# Patient Record
Sex: Male | Born: 1950
Health system: Southern US, Community
[De-identification: ages and names within clinical notes are randomized; demographics above are authoritative.]

## PROBLEM LIST (undated history)

## (undated) DIAGNOSIS — F32A Depression, unspecified: Secondary | ICD-10-CM

## (undated) DIAGNOSIS — I2510 Atherosclerotic heart disease of native coronary artery without angina pectoris: Secondary | ICD-10-CM

## (undated) DIAGNOSIS — I723 Aneurysm of iliac artery: Secondary | ICD-10-CM

## (undated) DIAGNOSIS — Z87442 Personal history of urinary calculi: Secondary | ICD-10-CM

## (undated) DIAGNOSIS — M1990 Unspecified osteoarthritis, unspecified site: Secondary | ICD-10-CM

## (undated) DIAGNOSIS — I6529 Occlusion and stenosis of unspecified carotid artery: Secondary | ICD-10-CM

## (undated) DIAGNOSIS — E785 Hyperlipidemia, unspecified: Secondary | ICD-10-CM

## (undated) DIAGNOSIS — Z9289 Personal history of other medical treatment: Secondary | ICD-10-CM

## (undated) DIAGNOSIS — I209 Angina pectoris, unspecified: Secondary | ICD-10-CM

## (undated) DIAGNOSIS — I10 Essential (primary) hypertension: Secondary | ICD-10-CM

## (undated) DIAGNOSIS — I219 Acute myocardial infarction, unspecified: Secondary | ICD-10-CM

## (undated) DIAGNOSIS — F329 Major depressive disorder, single episode, unspecified: Secondary | ICD-10-CM

## (undated) DIAGNOSIS — I7779 Dissection of other artery: Secondary | ICD-10-CM

## (undated) DIAGNOSIS — I639 Cerebral infarction, unspecified: Secondary | ICD-10-CM

## (undated) HISTORY — DX: Hyperlipidemia, unspecified: E78.5

## (undated) HISTORY — DX: Depression, unspecified: F32.A

## (undated) HISTORY — DX: Occlusion and stenosis of unspecified carotid artery: I65.29

## (undated) HISTORY — DX: Dissection of other specified artery: I77.79

## (undated) HISTORY — DX: Personal history of other medical treatment: Z92.89

## (undated) HISTORY — DX: Major depressive disorder, single episode, unspecified: F32.9

## (undated) HISTORY — DX: Cerebral infarction, unspecified: I63.9

## (undated) HISTORY — PX: CORONARY ANGIOPLASTY: SHX604

---

## 2003-07-09 ENCOUNTER — Encounter: Payer: Self-pay | Admitting: Emergency Medicine

## 2003-07-09 ENCOUNTER — Emergency Department (HOSPITAL_COMMUNITY): Admission: EM | Admit: 2003-07-09 | Discharge: 2003-07-09 | Payer: Self-pay | Admitting: Emergency Medicine

## 2004-04-05 ENCOUNTER — Emergency Department (HOSPITAL_COMMUNITY): Admission: EM | Admit: 2004-04-05 | Discharge: 2004-04-05 | Payer: Self-pay | Admitting: Emergency Medicine

## 2006-03-10 ENCOUNTER — Emergency Department (HOSPITAL_COMMUNITY): Admission: EM | Admit: 2006-03-10 | Discharge: 2006-03-10 | Payer: Self-pay | Admitting: Emergency Medicine

## 2009-09-26 DIAGNOSIS — I639 Cerebral infarction, unspecified: Secondary | ICD-10-CM

## 2009-09-26 HISTORY — DX: Cerebral infarction, unspecified: I63.9

## 2009-09-30 ENCOUNTER — Emergency Department (HOSPITAL_COMMUNITY): Admission: EM | Admit: 2009-09-30 | Discharge: 2009-10-01 | Payer: Self-pay | Admitting: Emergency Medicine

## 2010-12-12 LAB — BASIC METABOLIC PANEL
BUN: 12 mg/dL (ref 6–23)
CO2: 27 mEq/L (ref 19–32)
Calcium: 9.3 mg/dL (ref 8.4–10.5)
Chloride: 102 mEq/L (ref 96–112)
Creatinine, Ser: 1.03 mg/dL (ref 0.4–1.5)
GFR calc Af Amer: >60 mL/min (ref >60)
GFR calc non Af Amer: >60 mL/min (ref >60)
Glucose, Bld: 176 mg/dL — ABNORMAL HIGH (ref 70–99)
Potassium: 3.6 mEq/L (ref 3.5–5.1)
Sodium: 137 mEq/L (ref 135–145)

## 2011-10-27 ENCOUNTER — Emergency Department (HOSPITAL_COMMUNITY): Payer: Self-pay

## 2011-10-27 ENCOUNTER — Encounter (HOSPITAL_COMMUNITY): Payer: Self-pay | Admitting: Emergency Medicine

## 2011-10-27 ENCOUNTER — Emergency Department (HOSPITAL_COMMUNITY)
Admission: EM | Admit: 2011-10-27 | Discharge: 2011-10-27 | Disposition: A | Discharge status: Home / Self Care | Payer: Self-pay | Attending: Emergency Medicine | Admitting: Emergency Medicine

## 2011-10-27 ENCOUNTER — Other Ambulatory Visit: Payer: Self-pay

## 2011-10-27 DIAGNOSIS — R51 Headache: Secondary | ICD-10-CM | POA: Insufficient documentation

## 2011-10-27 DIAGNOSIS — E119 Type 2 diabetes mellitus without complications: Secondary | ICD-10-CM | POA: Insufficient documentation

## 2011-10-27 DIAGNOSIS — R05 Cough: Secondary | ICD-10-CM | POA: Insufficient documentation

## 2011-10-27 DIAGNOSIS — I10 Essential (primary) hypertension: Secondary | ICD-10-CM | POA: Insufficient documentation

## 2011-10-27 DIAGNOSIS — R059 Cough, unspecified: Secondary | ICD-10-CM | POA: Insufficient documentation

## 2011-10-27 DIAGNOSIS — R0602 Shortness of breath: Secondary | ICD-10-CM | POA: Insufficient documentation

## 2011-10-27 DIAGNOSIS — F17200 Nicotine dependence, unspecified, uncomplicated: Secondary | ICD-10-CM | POA: Insufficient documentation

## 2011-10-27 HISTORY — DX: Essential (primary) hypertension: I10

## 2011-10-27 LAB — DIFFERENTIAL
Basophils Absolute: 0 10*3/uL (ref 0.0–0.1)
Basophils Relative: 0 % (ref 0–1)
Eosinophils Absolute: 0.2 10*3/uL (ref 0.0–0.7)
Eosinophils Relative: 2 % (ref 0–5)
Lymphocytes Relative: 25 % (ref 12–46)
Lymphs Abs: 2.1 10*3/uL (ref 0.7–4.0)
Monocytes Absolute: 0.5 10*3/uL (ref 0.1–1.0)
Monocytes Relative: 6 % (ref 3–12)
Neutro Abs: 5.7 10*3/uL (ref 1.7–7.7)
Neutrophils Relative %: 67 % (ref 43–77)

## 2011-10-27 LAB — CBC
HCT: 45.7 % (ref 39.0–52.0)
Hemoglobin: 15.9 g/dL (ref 13.0–17.0)
MCH: 29.7 pg (ref 26.0–34.0)
MCHC: 34.8 g/dL (ref 30.0–36.0)
MCV: 85.3 fL (ref 78.0–100.0)
Platelets: 177 10*3/uL (ref 150–400)
RBC: 5.36 MIL/uL (ref 4.22–5.81)
RDW: 12.6 % (ref 11.5–15.5)
WBC: 8.5 10*3/uL (ref 4.0–10.5)

## 2011-10-27 LAB — BASIC METABOLIC PANEL
BUN: 14 mg/dL (ref 6–23)
CO2: 26 mEq/L (ref 19–32)
Calcium: 9.4 mg/dL (ref 8.4–10.5)
Chloride: 98 mEq/L (ref 96–112)
Creatinine, Ser: 0.96 mg/dL (ref 0.50–1.35)
GFR calc Af Amer: >90 mL/min (ref >90)
GFR calc non Af Amer: 88 mL/min — ABNORMAL LOW (ref >90)
Glucose, Bld: 209 mg/dL — ABNORMAL HIGH (ref 70–99)
Potassium: 3.1 mEq/L — ABNORMAL LOW (ref 3.5–5.1)
Sodium: 135 mEq/L (ref 135–145)

## 2011-10-27 MED ORDER — SODIUM CHLORIDE 0.9 % IV SOLN
INTRAVENOUS | Status: DC
  Administered 2011-10-27: 17:00:00 via INTRAVENOUS

## 2011-10-27 MED ORDER — LABETALOL HCL 5 MG/ML IV SOLN
20.0000 mg | Freq: Once | INTRAVENOUS | Status: AC
  Administered 2011-10-27: 20 mg via INTRAVENOUS
  Filled 2011-10-27: qty 4

## 2011-10-27 MED ORDER — MORPHINE SULFATE 4 MG/ML IJ SOLN
4.0000 mg | Freq: Once | INTRAMUSCULAR | Status: AC
  Administered 2011-10-27: 4 mg via INTRAVENOUS
  Filled 2011-10-27: qty 1

## 2011-10-27 MED ORDER — LISINOPRIL 10 MG PO TABS
10.0000 mg | ORAL_TABLET | Freq: Once | ORAL | Status: AC
  Administered 2011-10-27: 10 mg via ORAL
  Filled 2011-10-27: qty 1

## 2011-10-27 MED ORDER — ONDANSETRON HCL 4 MG/2ML IJ SOLN
4.0000 mg | Freq: Once | INTRAMUSCULAR | Status: AC
  Administered 2011-10-27: 4 mg via INTRAVENOUS
  Filled 2011-10-27: qty 2

## 2011-10-27 MED ORDER — LISINOPRIL 20 MG PO TABS: 10.0000 mg | ORAL_TABLET | Freq: Two times a day (BID) | ORAL | Status: DC

## 2011-10-27 NOTE — ED Provider Notes (Signed)
History     CSN: [id number]  Arrival date & time 10/27/11  1556   First MD Initiated Contact with Patient 10/27/11 1631      No chief complaint on file.   (Consider location/radiation/quality/duration/timing/severity/associated sxs/prior treatment) The history is provided by the patient and a relative.   the patient is a 61 year old male, with a history of hypertension, and diabetes, who presents to emergency department complaining of mild headache, elevated blood pressure, and intermittent, nonproductive cough, and shortness of breath.  He denies nausea, vomiting, fevers, chills, and sweating.  He denies chest pain, or abdominal pain.  He states that he has not had his medications for his low pressure for 2 weeks.  He has been having the symptoms.  For 2 weeks.  He also states that his blood sugar has been elevated in the mornings.  Past Medical History  Diagnosis Date  . Hypertension   . Diabetes mellitus     No past surgical history on file.  No family history on file.  History  Substance Use Topics  . Smoking status: Current Everyday Smoker  . Smokeless tobacco: Not on file  . Alcohol Use: No      Review of Systems  Constitutional: Negative for fever, chills and diaphoresis.  Eyes: Negative for visual disturbance.  Respiratory: Positive for cough and shortness of breath.   Cardiovascular: Negative for chest pain and leg swelling.  Gastrointestinal: Negative for nausea, vomiting and abdominal pain.  Skin: Negative for rash.  Neurological: Positive for headaches.  Psychiatric/Behavioral: Negative for confusion.  All other systems reviewed and are negative.    Allergies  Review of patient's allergies indicates no known allergies.  Home Medications  No current outpatient prescriptions on file.  BP 225/127  Pulse 70  Temp(Src) 98.6 F (37 C) (Oral)  Resp 17  SpO2 100%  Physical Exam  Vitals reviewed. Constitutional: He is oriented to person, place, and  time. He appears well-developed and well-nourished. No distress.  HENT:  Head: Normocephalic and atraumatic.  Eyes: EOM are normal. Pupils are equal, round, and reactive to light.  Neck: Normal range of motion. Neck supple.  Cardiovascular: Normal rate, regular rhythm and normal heart sounds.   No murmur heard. Pulmonary/Chest: Effort normal and breath sounds normal. No respiratory distress. He has no wheezes. He has no rales.  Abdominal: Soft. Bowel sounds are normal. He exhibits no distension and no mass. There is no tenderness. There is no rebound and no guarding.  Musculoskeletal: Normal range of motion. He exhibits no edema and no tenderness.  Neurological: He is alert and oriented to person, place, and time. No cranial nerve deficit.  Skin: Skin is warm and dry. He is not diaphoretic.  Psychiatric: He has a normal mood and affect. His behavior is normal.    ED Course  Procedures (including critical care time) 61 year old male, with known hypertension, and diabetes.  Presents with mild headache and nonproductive cough, was severely elevated blood pressure.  He also reports elevated blood sugars for the past few days.  In the morning.  We will establish an IV and treat his hypertension, as well as performing laboratory testing, and a chest x-ray looking for endorgan damage, and the degree of hyperglycemia.  He has a normal mental status and neurological examination, and there is no indication that he has hepatic encephalopathy.  At this time.   Labs Reviewed  BASIC METABOLIC PANEL  CBC  DIFFERENTIAL   No results found.   No diagnosis found.  6:50 PM sbp 204. Asx.  Will give more labetalol  8:07 PM Asx. sbp 176.   MDM  htn No endorgan damage        Nicholes Stairs, MD 10/27/11 2008

## 2011-10-27 NOTE — ED Notes (Signed)
Pt bought in by [facility] for  High bp and  blood sugar out of meds  for 2 wks.denies   dizziness or pain  speaks no english( [place] )son is at bedside to interpet

## 2011-10-27 NOTE — ED Notes (Signed)
[id number]<AV> Expected date:10/27/11<BR> Expected time: 3:40 PM<BR> Means of arrival:Ambulance<BR> Comments:<BR> EMS 30 GC, 60 yom weakness w hypertension and hyperglycemia

## 2011-11-10 ENCOUNTER — Ambulatory Visit: Payer: Self-pay | Admitting: Family Medicine

## 2011-11-10 DIAGNOSIS — Z8673 Personal history of transient ischemic attack (TIA), and cerebral infarction without residual deficits: Secondary | ICD-10-CM | POA: Insufficient documentation

## 2011-11-10 DIAGNOSIS — I639 Cerebral infarction, unspecified: Secondary | ICD-10-CM

## 2011-11-10 DIAGNOSIS — I10 Essential (primary) hypertension: Secondary | ICD-10-CM | POA: Insufficient documentation

## 2011-11-10 DIAGNOSIS — E119 Type 2 diabetes mellitus without complications: Secondary | ICD-10-CM

## 2011-11-10 DIAGNOSIS — Z789 Other specified health status: Secondary | ICD-10-CM | POA: Insufficient documentation

## 2011-11-10 LAB — GLUCOSE, POCT (MANUAL RESULT ENTRY): POC Glucose: 127

## 2011-11-10 LAB — POCT GLYCOSYLATED HEMOGLOBIN (HGB A1C): Hemoglobin A1C: 8.7

## 2011-11-10 MED ORDER — AMLODIPINE BESYLATE 5 MG PO TABS: 5.0000 mg | ORAL_TABLET | Freq: Every day | ORAL | Status: DC

## 2011-11-10 MED ORDER — METFORMIN HCL 500 MG PO TABS: 500.0000 mg | ORAL_TABLET | Freq: Two times a day (BID) | ORAL | Status: DC

## 2011-11-10 MED ORDER — LISINOPRIL 20 MG PO TABS: 20.0000 mg | ORAL_TABLET | Freq: Two times a day (BID) | ORAL | Status: DC

## 2011-11-10 NOTE — Progress Notes (Signed)
Patient Name: Barry [name] Date of Birth: [birth year] Medical Record Number: [id number] Gender: male Date of Encounter: 11/10/2011  History of Present Illness:  Barry [name] is a 61 y.o. very pleasant male patient who presents with the following:  Was at [facility] ED or 10/27/11 with hypertension. Started on lisinopril at that time.  Barry [name] is [place]- here with his son today who interprets for him.  Barry [name] went to the ED due to feeling bad, flu- like symptoms.  He was feeling faint and having headaches.  Felt like his hands and legs were cramping.  His note also mentions diabetes- it does not seem that the patient or his son are aware of this diagnosis.   Now he feels much the same.  Still feels dizzy, still has headaches- not better.  Still has complaint of right sided tingling/ cramping and weakness/ tingling that seems to travel to different areas of his body. This has gone on for 2 years as well.    Fainted at work 3 or 4 years ago.  Was seen at a doctor somewhere and told he had HTN.  He started on medication but he did not continue to take it.    Was last at our office 12/2008 at which point he was not on anti-hypertensives.  In the course of the exam it also came out that Barry [name] had a stroke about 2 years ago with resultant left sided weakness- he states this is stable since his CVA.  He also apparently was aware that he has diabetes and does home glucose checks.    Reviewed labs from ED visit 2 weeks ago-    Chemistry      Component Value Date/Time   NA 135 10/27/2011 1715   K 3.1* 10/27/2011 1715   CL 98 10/27/2011 1715   CO2 26 10/27/2011 1715   BUN 14 10/27/2011 1715   CREATININE 0.96 10/27/2011 1715      Component Value Date/Time   CALCIUM 9.4 10/27/2011 1715     Lab Results  Component Value Date   WBC 8.5 10/27/2011   HGB 15.9 10/27/2011   HCT 45.7 10/27/2011   MCV 85.3 10/27/2011   PLT 177 10/27/2011   I have reviewed the patient's medical history in detail and updated the  computerized patient record Barry [name] this information should be available now.   There is no problem list on file for this patient.  Past Medical History  Diagnosis Date  . Hypertension   . Diabetes mellitus    No past surgical history on file. History  Substance Use Topics  . Smoking status: Former Smoker    Quit date: 06/09/2009  . Smokeless tobacco: Not on file  . Alcohol Use: No   No family history on file. No Known Allergies  Medication list has been reviewed and updated.  Review of Systems: As per HPI, otherwise negative.   Physical Examination: Filed Vitals:   11/10/11 1143  BP: 170/79  Pulse: 73  Temp: 99 F (37.2 C)  TempSrc: Oral  Resp: 16  Height: 5' 5.5" (1.664 m)  Weight: 176 lb 9.6 oz (80.105 kg)    Body mass index is 28.94 kg/(m^2).  GEN: WDWN, NAD, Non-toxic, A & O x 3.  Speaks little [place]- son interpreted.   HEENT: Atraumatic, Normocephalic. Neck supple. No masses, No LAD.  PEERLA, EOMI, fundoscopic wnl Ears and Nose: No external deformity.  TM wnl bilaterally, oropharynx wnl CV: RRR, No M/G/R. No JVD. No thrill. No extra heart sounds.  PULM: CTA B, no wheezes, crackles, rhonchi. No retractions. No resp. distress. No accessory muscle use. ABD: S, NT, ND, +BS. No rebound. No HSM. EXTR: No c/c/e.  symmetrical weakness left upper and lower extremity, also decreased sensation.  norml patellar and biceps tendon DTR bilaterally.   NEURO Normal gait.  PSYCH: Normally interactive. Conversant. Not depressed or anxious appearing.  Calm demeanor.  Skin:  No rashes, multiple tatoos  Results for orders placed in visit on 11/10/11  POCT GLYCOSYLATED HEMOGLOBIN (HGB A1C)      Component Value Range   Hemoglobin A1C 8.7    GLUCOSE, POCT (MANUAL RESULT ENTRY)      Component Value Range   POC Glucose 127      Assessment and Plan: 61 year old [place] male with history of CVA, also with uncontrolled HTN and Diabetes.  Will start on Metformin 500 BID. Apparently  he is currently taking lisinopril 20mg  3 tablets daily.  Will have him change to taking lisinopril 20 BID and add Norvasc 5mg  daily (avoid diuretics for now due to concern re: low potassium).  Plan recheck in one week.  Will look for potassium results- if still low will call in a potassium replacement.   [phone]  Son - [name]

## 2011-11-10 NOTE — Patient Instructions (Signed)
Take all medications as prescribed. Come in for a recheck in 1 week.

## 2011-11-11 LAB — COMPREHENSIVE METABOLIC PANEL
ALT: 32 U/L (ref 0–53)
AST: 25 U/L (ref 0–37)
Albumin: 4.3 g/dL (ref 3.5–5.2)
Alkaline Phosphatase: 90 U/L (ref 39–117)
BUN: 13 mg/dL (ref 6–23)
CO2: 28 mEq/L (ref 19–32)
Calcium: 9.6 mg/dL (ref 8.4–10.5)
Chloride: 102 mEq/L (ref 96–112)
Creat: 1.08 mg/dL (ref 0.50–1.35)
Glucose, Bld: 134 mg/dL — ABNORMAL HIGH (ref 70–99)
Potassium: 4.4 mEq/L (ref 3.5–5.3)
Sodium: 141 mEq/L (ref 135–145)
Total Bilirubin: 0.7 mg/dL (ref 0.3–1.2)
Total Protein: 7 g/dL (ref 6.0–8.3)

## 2011-11-12 ENCOUNTER — Encounter: Payer: Self-pay | Admitting: Family Medicine

## 2012-06-20 ENCOUNTER — Other Ambulatory Visit: Payer: Self-pay | Admitting: Family Medicine

## 2012-08-01 ENCOUNTER — Telehealth: Payer: Self-pay

## 2012-08-01 NOTE — Telephone Encounter (Signed)
Needs office visit. Can do one month but not three. Unable to leave message.

## 2012-08-01 NOTE — Telephone Encounter (Signed)
Pt son is calling regarding is leaving on the Nov. 15 th to go [place], pt would like to have all of his medications filled for 3 months. Best# [phone]

## 2012-08-02 ENCOUNTER — Ambulatory Visit (INDEPENDENT_AMBULATORY_CARE_PROVIDER_SITE_OTHER): Payer: Medicare Other | Admitting: Family Medicine

## 2012-08-02 ENCOUNTER — Other Ambulatory Visit: Payer: Self-pay | Admitting: Family Medicine

## 2012-08-02 VITALS — BP 135/82 | HR 66 | Temp 98.1°F | Resp 16 | Ht 65.0 in | Wt 167.0 lb

## 2012-08-02 DIAGNOSIS — E119 Type 2 diabetes mellitus without complications: Secondary | ICD-10-CM | POA: Diagnosis not present

## 2012-08-02 DIAGNOSIS — I6350 Cerebral infarction due to unspecified occlusion or stenosis of unspecified cerebral artery: Secondary | ICD-10-CM | POA: Diagnosis not present

## 2012-08-02 DIAGNOSIS — I10 Essential (primary) hypertension: Secondary | ICD-10-CM | POA: Diagnosis not present

## 2012-08-02 DIAGNOSIS — I639 Cerebral infarction, unspecified: Secondary | ICD-10-CM

## 2012-08-02 LAB — POCT CBC
Granulocyte percent: 59.3 %G (ref 37–80)
HCT, POC: 44.7 % (ref 43.5–53.7)
Hemoglobin: 13.8 g/dL — AB (ref 14.1–18.1)
Lymph, poc: 2.7 (ref 0.6–3.4)
MCH, POC: 28.6 pg (ref 27–31.2)
MCHC: 30.9 g/dL — AB (ref 31.8–35.4)
MCV: 92.5 fL (ref 80–97)
MID (cbc): 0.6 (ref 0–0.9)
MPV: 9.4 fL (ref 0–99.8)
POC Granulocyte: 4.7 (ref 2–6.9)
POC LYMPH PERCENT: 33.8 %L (ref 10–50)
POC MID %: 6.9 %M (ref 0–12)
Platelet Count, POC: 208 10*3/uL (ref 142–424)
RBC: 4.83 M/uL (ref 4.69–6.13)
RDW, POC: 13.7 %
WBC: 8 10*3/uL (ref 4.6–10.2)

## 2012-08-02 LAB — POCT URINALYSIS DIPSTICK
Bilirubin, UA: NEGATIVE
Glucose, UA: 100
Ketones, UA: NEGATIVE
Leukocytes, UA: NEGATIVE
Nitrite, UA: NEGATIVE
Protein, UA: NEGATIVE
Spec Grav, UA: 1.02
Urobilinogen, UA: 0.2
pH, UA: 7

## 2012-08-02 LAB — POCT GLYCOSYLATED HEMOGLOBIN (HGB A1C): Hemoglobin A1C: 6.7

## 2012-08-02 MED ORDER — METFORMIN HCL 500 MG PO TABS: 500.0000 mg | ORAL_TABLET | Freq: Two times a day (BID) | ORAL | Status: DC

## 2012-08-02 MED ORDER — LISINOPRIL 20 MG PO TABS: 20.0000 mg | ORAL_TABLET | Freq: Two times a day (BID) | ORAL | Status: DC

## 2012-08-02 NOTE — Patient Instructions (Addendum)
1. Type 2 diabetes mellitus  POCT CBC, POCT glycosylated hemoglobin (Hb A1C), POCT urinalysis dipstick, Microalbumin, urine, Comprehensive metabolic panel, metFORMIN (GLUCOPHAGE) 500 MG tablet  2. Essential hypertension, benign  POCT CBC, POCT glycosylated hemoglobin (Hb A1C), POCT urinalysis dipstick, Microalbumin, urine, Comprehensive metabolic panel  3. CVA (cerebral vascular accident)    4. Hypertension  lisinopril (PRINIVIL,ZESTRIL) 20 MG tablet      START ASPIRIN 325 MG ONE TABLET DAILY FOR STROKE AND HEART ATTACK PREVENTION

## 2012-08-02 NOTE — Progress Notes (Signed)
[address]   [place], [place]  [address]   [phone]  Subjective:    Patient ID: Barry [name], male    DOB: 24-Nov-1950, 61 y.o.   MRN: [id number]  HPIThis 61 y.o. male presents for nine month follow-up of the following:  1.  HTN:  Nine month follow-up.  [place]aving [place] for three [place]; going back to [place].  Taking Lisinopril 20mg  once daily.  Taking Amlodipine once daily.  +chest pain sometimes; usually occurs when hot.  +SOB at times.  No leg swelling.  +HA sometimes.  No dizziness.  2. DMII:  Nine month follow-up; checking blood sugars sporadically.  Friend has glucometer and checks sporadically.  Feels well.  3.  CVA: no daily ASA.  No HA, dizziness, focal weakness that is new.     Review of Systems  Constitutional: Negative for fever, chills, diaphoresis and fatigue.  Eyes: Negative for photophobia and visual disturbance.  Respiratory: Negative for shortness of breath, wheezing and stridor.   Cardiovascular: Positive for chest pain. Negative for palpitations and leg swelling.  Gastrointestinal: Negative for nausea, vomiting, abdominal pain, diarrhea and constipation.  Skin: Negative for color change, pallor, rash and wound.  Neurological: Negative for dizziness, tremors, seizures, syncope, facial asymmetry, speech difficulty, weakness, light-headedness, numbness and headaches.        Past Medical History  Diagnosis Date  . Hypertension   . Diabetes mellitus   . Stroke 09/26/2009    Syncope.  [facility]  admission.    Past Surgical History  Procedure Date  . Admission 09/26/2009    CVA.  [facility].    Prior to Admission medications   Medication Sig Start Date End Date Taking? Authorizing Provider  amLODipine (NORVASC) 5 MG tablet TAKE ONE TABLET BY MOUTH EVERY DAY 06/20/12  Yes Morrell Riddle, PA-C  lisinopril (PRINIVIL,ZESTRIL) 20 MG tablet Take 1 tablet (20 mg total) by mouth 2 (two) times daily. 08/02/12 08/02/13 Yes Ethelda Chick, MD  metFORMIN (GLUCOPHAGE) 500  MG tablet Take 1 tablet (500 mg total) by mouth 2 (two) times daily with a meal. 08/02/12 08/02/13 Yes Ethelda Chick, MD    No Known Allergies  History   Social History  . Marital Status: Single    Spouse Name: N/A    Number of Children: N/A  . Years of Education: N/A   Occupational History  . Not on file.   Social History Main Topics  . Smoking status: Former Smoker    Quit date: 06/09/2009  . Smokeless tobacco: Not on file  . Alcohol Use: No  . Drug Use: No  . Sexually Active: Not Currently   Other Topics Concern  . Not on file   Social History Narrative   Marital: married.   Lives: with son,wife.    Children: 3 children; 6 grandchildren   Employed: unemployed; disability unknown reason/CVA L sided weakness   Tobacco:  Quit 2012; smoked 40 years.    Alcohol: no drinking now; social in past.    Drugs: none    Exercise: sporadic.    ADLs:  No driving since CVA.    No family history on file.  Objective:a   Physical Exam  Nursing note and vitals reviewed. Constitutional: He is oriented to person, place, and time. He appears well-developed and well-nourished. No distress.  HENT:  Head: Normocephalic and atraumatic.  Right Ear: External ear normal.  Left Ear: External ear normal.  Nose: Nose normal.  Mouth/Throat: Oropharynx is clear and moist.  Eyes: Conjunctivae normal and EOM are normal. Pupils are equal, round, and reactive to light.  Neck: Normal range of motion. Neck supple. No JVD present. No thyromegaly present.  Cardiovascular: Normal rate, regular rhythm, normal heart sounds and intact distal pulses.  Exam reveals no gallop and no friction rub.   No murmur heard. Pulmonary/Chest: Effort normal and breath sounds normal. He has no wheezes. He has no rales.  Abdominal: Soft. Bowel sounds are normal. There is no tenderness. There is no rebound and no guarding.  Lymphadenopathy:    He has no cervical adenopathy.  Neurological: He is alert and oriented to person,  place, and time.  Skin: Skin is warm and dry. No rash noted. He is not diaphoretic. No erythema. No pallor.  Psychiatric: He has a normal mood and affect. His behavior is normal.       Results for orders placed in visit on 08/02/12  POCT CBC      Component Value Range   WBC 8.0  4.6 - 10.2 K/uL   Lymph, poc 2.7  0.6 - 3.4   POC LYMPH PERCENT 33.8  10 - 50 %L   MID (cbc) 0.6  0 - 0.9   POC MID % 6.9  0 - 12 %M   POC Granulocyte 4.7  2 - 6.9   Granulocyte percent 59.3  37 - 80 %G   RBC 4.83  4.69 - 6.13 M/uL   Hemoglobin 13.8 (*) 14.1 - 18.1 g/dL   HCT, POC 16.1  09.6 - 53.7 %   MCV 92.5  80 - 97 fL   MCH, POC 28.6  27 - 31.2 pg   MCHC 30.9 (*) 31.8 - 35.4 g/dL   RDW, POC 04.5     Platelet Count, POC 208  142 - 424 K/uL   MPV 9.4  0 - 99.8 fL  POCT GLYCOSYLATED HEMOGLOBIN (HGB A1C)      Component Value Range   Hemoglobin A1C 6.7    POCT URINALYSIS DIPSTICK      Component Value Range   Color, UA yellow     Clarity, UA clear     Glucose, UA 100     Bilirubin, UA neg     Ketones, UA neg     Spec Grav, UA 1.020     Blood, UA small     pH, UA 7.0     Protein, UA neg     Urobilinogen, UA 0.2     Nitrite, UA neg     Leukocytes, UA Negative      Assessment & Plan:   1. Type 2 diabetes mellitus  POCT CBC, POCT glycosylated hemoglobin (Hb A1C), POCT urinalysis dipstick, Microalbumin, urine, Comprehensive metabolic panel, metFORMIN (GLUCOPHAGE) 500 MG tablet  2. Essential hypertension, benign  POCT CBC, POCT glycosylated hemoglobin (Hb A1C), POCT urinalysis dipstick, Microalbumin, urine, Comprehensive metabolic panel  3. CVA (cerebral vascular accident)    4. Hypertension  lisinopril (PRINIVIL,ZESTRIL) 20 MG tablet     1. DMII: controlled; obtain labs.  Refills provided. 2.  HTN:  Controlled; obtain labs; refills provided. 3. CVA:  Stable; recommend starting ASA 325mg  daily.    Meds ordered this encounter  Medications  . metFORMIN (GLUCOPHAGE) 500 MG tablet    Sig: Take  1 tablet (500 mg total) by mouth 2 (two) times daily with a meal.    Dispense:  180 tablet    Refill:  1  . lisinopril (PRINIVIL,ZESTRIL) 20 MG tablet    Sig: Take  1 tablet (20 mg total) by mouth 2 (two) times daily.    Dispense:  180 tablet    Refill:  1

## 2012-08-03 LAB — COMPREHENSIVE METABOLIC PANEL
ALT: 35 U/L (ref 0–53)
AST: 33 U/L (ref 0–37)
Albumin: 4.2 g/dL (ref 3.5–5.2)
Alkaline Phosphatase: 81 U/L (ref 39–117)
BUN: 21 mg/dL (ref 6–23)
CO2: 31 mEq/L (ref 19–32)
Calcium: 9.7 mg/dL (ref 8.4–10.5)
Chloride: 106 mEq/L (ref 96–112)
Creat: 1.43 mg/dL — ABNORMAL HIGH (ref 0.50–1.35)
Glucose, Bld: 156 mg/dL — ABNORMAL HIGH (ref 70–99)
Potassium: 4.5 mEq/L (ref 3.5–5.3)
Sodium: 144 mEq/L (ref 135–145)
Total Bilirubin: 0.5 mg/dL (ref 0.3–1.2)
Total Protein: 6.8 g/dL (ref 6.0–8.3)

## 2012-08-04 ENCOUNTER — Other Ambulatory Visit: Payer: Self-pay | Admitting: Physician Assistant

## 2012-08-04 LAB — MICROALBUMIN, URINE: Microalb, Ur: 4.55 mg/dL — ABNORMAL HIGH (ref 0.00–1.89)

## 2012-08-05 NOTE — Telephone Encounter (Signed)
Pt seen in office 11/7

## 2012-11-05 NOTE — Progress Notes (Signed)
Reviewed and agree.

## 2012-11-22 ENCOUNTER — Emergency Department (HOSPITAL_COMMUNITY)
Admission: EM | Admit: 2012-11-22 | Discharge: 2012-11-22 | Disposition: A | Discharge status: Home / Self Care | Payer: Medicare Other | Attending: Emergency Medicine | Admitting: Emergency Medicine

## 2012-11-22 ENCOUNTER — Encounter (HOSPITAL_COMMUNITY): Payer: Self-pay | Admitting: Emergency Medicine

## 2012-11-22 ENCOUNTER — Emergency Department (HOSPITAL_COMMUNITY): Payer: Medicare Other

## 2012-11-22 DIAGNOSIS — K6389 Other specified diseases of intestine: Secondary | ICD-10-CM

## 2012-11-22 DIAGNOSIS — I10 Essential (primary) hypertension: Secondary | ICD-10-CM | POA: Diagnosis not present

## 2012-11-22 DIAGNOSIS — Z8673 Personal history of transient ischemic attack (TIA), and cerebral infarction without residual deficits: Secondary | ICD-10-CM | POA: Insufficient documentation

## 2012-11-22 DIAGNOSIS — E119 Type 2 diabetes mellitus without complications: Secondary | ICD-10-CM | POA: Diagnosis not present

## 2012-11-22 DIAGNOSIS — Z79899 Other long term (current) drug therapy: Secondary | ICD-10-CM | POA: Diagnosis not present

## 2012-11-22 DIAGNOSIS — R1084 Generalized abdominal pain: Secondary | ICD-10-CM | POA: Diagnosis not present

## 2012-11-22 DIAGNOSIS — Z87891 Personal history of nicotine dependence: Secondary | ICD-10-CM | POA: Insufficient documentation

## 2012-11-22 LAB — URINALYSIS, MICROSCOPIC ONLY
Bilirubin Urine: NEGATIVE
Glucose, UA: NEGATIVE mg/dL
Ketones, ur: NEGATIVE mg/dL
Leukocytes, UA: NEGATIVE
Nitrite: NEGATIVE
Protein, ur: NEGATIVE mg/dL
Specific Gravity, Urine: 1.016 (ref 1.005–1.030)
Urobilinogen, UA: 0.2 mg/dL (ref 0.0–1.0)
pH: 6 (ref 5.0–8.0)

## 2012-11-22 LAB — COMPREHENSIVE METABOLIC PANEL
ALT: 27 U/L (ref 0–53)
AST: 23 U/L (ref 0–37)
Albumin: 3.6 g/dL (ref 3.5–5.2)
Alkaline Phosphatase: 101 U/L (ref 39–117)
BUN: 13 mg/dL (ref 6–23)
CO2: 24 mEq/L (ref 19–32)
Calcium: 9.7 mg/dL (ref 8.4–10.5)
Chloride: 100 mEq/L (ref 96–112)
Creatinine, Ser: 1.03 mg/dL (ref 0.50–1.35)
GFR calc Af Amer: 88 mL/min — ABNORMAL LOW (ref >90)
GFR calc non Af Amer: 76 mL/min — ABNORMAL LOW (ref >90)
Glucose, Bld: 127 mg/dL — ABNORMAL HIGH (ref 70–99)
Potassium: 3.7 mEq/L (ref 3.5–5.1)
Sodium: 137 mEq/L (ref 135–145)
Total Bilirubin: 0.4 mg/dL (ref 0.3–1.2)
Total Protein: 7.9 g/dL (ref 6.0–8.3)

## 2012-11-22 LAB — CBC WITH DIFFERENTIAL/PLATELET
Basophils Absolute: 0 10*3/uL (ref 0.0–0.1)
Basophils Relative: 0 % (ref 0–1)
Eosinophils Absolute: 0.3 10*3/uL (ref 0.0–0.7)
Eosinophils Relative: 4 % (ref 0–5)
HCT: 47.4 % (ref 39.0–52.0)
Hemoglobin: 15.6 g/dL (ref 13.0–17.0)
Lymphocytes Relative: 31 % (ref 12–46)
Lymphs Abs: 2.5 10*3/uL (ref 0.7–4.0)
MCH: 28.9 pg (ref 26.0–34.0)
MCHC: 32.9 g/dL (ref 30.0–36.0)
MCV: 87.9 fL (ref 78.0–100.0)
Monocytes Absolute: 0.6 10*3/uL (ref 0.1–1.0)
Monocytes Relative: 7 % (ref 3–12)
Neutro Abs: 4.8 10*3/uL (ref 1.7–7.7)
Neutrophils Relative %: 58 % (ref 43–77)
Platelets: 195 10*3/uL (ref 150–400)
RBC: 5.39 MIL/uL (ref 4.22–5.81)
RDW: 12.7 % (ref 11.5–15.5)
WBC: 8.2 10*3/uL (ref 4.0–10.5)

## 2012-11-22 LAB — LIPASE, BLOOD: Lipase: 40 U/L (ref 11–59)

## 2012-11-22 LAB — GLUCOSE, CAPILLARY: Glucose-Capillary: 129 mg/dL — ABNORMAL HIGH (ref 70–99)

## 2012-11-22 LAB — POCT I-STAT TROPONIN I: Troponin i, poc: 0.01 ng/mL (ref 0.00–0.08)

## 2012-11-22 MED ORDER — OXYCODONE-ACETAMINOPHEN 5-325 MG PO TABS: 1.0000 | ORAL_TABLET | ORAL | Status: DC | PRN

## 2012-11-22 MED ORDER — ONDANSETRON HCL 4 MG/2ML IJ SOLN
4.0000 mg | Freq: Once | INTRAMUSCULAR | Status: AC
  Administered 2012-11-22: 4 mg via INTRAVENOUS
  Filled 2012-11-22: qty 2

## 2012-11-22 MED ORDER — SODIUM CHLORIDE 0.9 % IV BOLUS (SEPSIS)
1000.0000 mL | Freq: Once | INTRAVENOUS | Status: AC
  Administered 2012-11-22: 1000 mL via INTRAVENOUS

## 2012-11-22 MED ORDER — MORPHINE SULFATE 4 MG/ML IJ SOLN
6.0000 mg | Freq: Once | INTRAMUSCULAR | Status: AC
  Administered 2012-11-22: 6 mg via INTRAVENOUS
  Filled 2012-11-22: qty 2

## 2012-11-22 MED ORDER — IOHEXOL 300 MG/ML  SOLN
80.0000 mL | Freq: Once | INTRAMUSCULAR | Status: AC | PRN
  Administered 2012-11-22: 80 mL via INTRAVENOUS

## 2012-11-22 MED ORDER — IOHEXOL 300 MG/ML  SOLN
50.0000 mL | Freq: Once | INTRAMUSCULAR | Status: AC | PRN
  Administered 2012-11-22: 50 mL via ORAL

## 2012-11-22 NOTE — Progress Notes (Signed)
[facility] ED CM noted pt without pcp listed Spoke with pt, daughter and male family member who confirmed no pcp CM provided a list of medicare accepting pc[place] [phone] and discussed if medicaid referred to DSS case worker for assistance in finding accepting medicaid provider

## 2012-11-22 NOTE — ED Notes (Signed)
Pt presenting to ed with c/o [place] speaking male presenting to ed from front entrance by hospital staff with c/o dizziness, generalized weakness, frequent urination, and abdominal pain x couple of weeks. Pt denies chest pain, nausea, vomiting and diarrhea per family interpreting at bedside.

## 2012-11-23 ENCOUNTER — Telehealth: Payer: Self-pay | Admitting: Surgery

## 2012-11-23 ENCOUNTER — Other Ambulatory Visit: Payer: Self-pay | Admitting: *Deleted

## 2012-11-23 DIAGNOSIS — I7779 Dissection of other artery: Secondary | ICD-10-CM

## 2012-11-23 NOTE — Telephone Encounter (Addendum)
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 We'll need to contact him to schedule the appointment.  pt. needs interpreter I tried to inform patient of appt. for a cta at [facility] on 12-03-12 10:45 and then fu with dr. Myra Gianotti at 1:45 mailed appt. letter  ------

## 2012-11-25 NOTE — ED Provider Notes (Signed)
History    62 year old male with abdominal pain. Gradual onset approximately 2-1/2 weeks ago. Pain is diffuse and crampy. Waxes and wanes, but does not completely go away. No appreciable exacerbating/ relieving factors. Also complaining of generalized fatigue increased urinary frequency. No dysuria. No hematuria. No discharge. No nausea or vomiting. No history of abdominal surgery. History obtained via family members at bedside translating.  CSN: [id number]  Arrival date & time 11/22/12  1416   First MD Initiated Contact with Patient 11/22/12 1506      Chief Complaint  Patient presents with  . Dizziness  . generalized weakness   . Abdominal Pain    (Consider location/radiation/quality/duration/timing/severity/associated sxs/prior treatment) HPI  Past Medical History  Diagnosis Date  . Hypertension   . Diabetes mellitus   . Stroke 09/26/2009    Syncope.  [facility]  admission.    Past Surgical History  Procedure Laterality Date  . Admission  09/26/2009    CVA.  [facility].    No family history on file.  History  Substance Use Topics  . Smoking status: Former Smoker    Quit date: 06/09/2009  . Smokeless tobacco: Not on file  . Alcohol Use: No      Review of Systems  All systems reviewed and negative, other than as noted in HPI.  Allergies  Review of patient's allergies indicates no known allergies.  Home Medications   Current Outpatient Rx  Name  Route  Sig  Dispense  Refill  . amLODipine (NORVASC) 5 MG tablet   Oral   Take 5 mg by mouth daily.         . metFORMIN (GLUCOPHAGE) 500 MG tablet   Oral   Take 1 tablet (500 mg total) by mouth 2 (two) times daily with a meal.   180 tablet   1   . oxyCODONE-acetaminophen (PERCOCET/ROXICET) 5-325 MG per tablet   Oral   Take 1-2 tablets by mouth every 4 (four) hours as needed for pain.   20 tablet   0     BP 148/86  Pulse 56  Temp(Src) 98.2 F (36.8 C) (Oral)  Resp 11  SpO2 96%  Physical Exam   Nursing note and vitals reviewed. Constitutional: He appears well-developed and well-nourished. No distress.  Laying in bed. NAD.  HENT:  Head: Normocephalic and atraumatic.  Eyes: Conjunctivae are normal. Right eye exhibits no discharge. Left eye exhibits no discharge.  Neck: Neck supple.  Cardiovascular: Normal rate, regular rhythm and normal heart sounds.  Exam reveals no gallop and no friction rub.   No murmur heard. Pulmonary/Chest: Effort normal and breath sounds normal. No respiratory distress.  Abdominal: Soft. He exhibits no distension. There is tenderness. There is no rebound.  Mild tenderness periumbilically in the right lower quadrant. No rebound tenderness. No guarding  Musculoskeletal: He exhibits no edema and no tenderness.  Neurological: He is alert.  Skin: Skin is warm and dry. He is not diaphoretic.  Psychiatric: He has a normal mood and affect. His behavior is normal. Thought content normal.    ED Course  Procedures (including critical care time)  Labs Reviewed  GLUCOSE, CAPILLARY - Abnormal; Notable for the following:    Glucose-Capillary 129 (*)    All other components within normal limits  COMPREHENSIVE METABOLIC PANEL - Abnormal; Notable for the following:    Glucose, Bld 127 (*)    GFR calc non Af Amer 76 (*)    GFR calc Af Amer 88 (*)    All  other components within normal limits  URINALYSIS, MICROSCOPIC ONLY - Abnormal; Notable for the following:    Hgb urine dipstick SMALL (*)    All other components within normal limits  CBC WITH DIFFERENTIAL  LIPASE, BLOOD  POCT I-STAT TROPONIN I   No results found.  Ct Abdomen Pelvis W Contrast  11/22/2012  *RADIOLOGY REPORT*  Clinical Data: Right lower quadrant pain  CT ABDOMEN AND PELVIS WITH CONTRAST  Technique:  Multidetector CT imaging of the abdomen and pelvis was performed following the standard protocol during bolus administration of intravenous contrast.  Contrast: 80mL OMNIPAQUE IOHEXOL 300 MG/ML  SOLN,  50mL OMNIPAQUE IOHEXOL 300 MG/ML  SOLN  Comparison: None.  Findings: The lung bases appear clear.  No pericardial or pleural effusion identified.  Mild diffuse low attenuation throughout the liver parenchyma is identified compatible with hepatic steatosis.  No suspicious liver lesion noted.  Gallbladder is normal.  No biliary dilatation.  The pancreas is unremarkable.  Normal appearance of the spleen.  The adrenal glands are normal.  Within the inferior pole of the right kidney there is a stone measuring 5 mm, image 33/series 2. No left renal stones identified.  No hydronephrosis or hydroureter. The urinary bladder appears normal.  No bladder calculi identified. Prostate gland and the seminal vesicles are negative.  Calcified atherosclerotic disease affects the abdominal aorta.  Non- occlusive dissection is identified within the proximal superior mesenteric artery, image 28/series 2. Plaque ulceration is identified within the distal abdominal aorta just proximal to the bifurcation, image number 40/series 2.  There are no enlarged upper abdominal lymph nodes.  There is no pelvic or inguinal adenopathy.  The stomach appears within normal limits.  The small bowel loops are unremarkable.  No evidence for bowel obstruction.  There is mild edema involving the distal half of the appendix.  Appendiceal diameter measures up to 10 mm in thickness, image 48/series 8. There is abnormal inflammatory change involving the sigmoid colon. There is a fat attenuating structure within the sigmoid mesentery with peripheral fat stranding.  The appearance favors epiploic appendagitis, image 46/series 2. No evidence for free air.  No bowel perforation identified.  There is no significant free fluid or fluid collections identified.  Review of the visualized osseous structures is significant for mild lumbar degenerative disc disease.  IMPRESSION:  1.  Findings consistent with epiploic appendagitis involving the sigmoid colon.  2.  The distal  appendix is increased in diameter measuring up to 10 mm.  No additional secondary signs of acute appendicitis.  Careful clinical correlation for any clinical signs or symptoms of appendicitis is advised. 3.  Hepatic steatosis. 4.  Nonobstructing right renal calculus. 5.  Incidental note is made of focal dissection involving the proximal superior mesenteric artery.  Additionally, there is a focal area of plaque ulceration involving the distal abdominal aorta.  Both of these abnormalities appear nonocclusive and there is distal contrast opacification of the affected vessels.   Original Report Authenticated By: Signa Kell, M.D.    EKG:  Rhythm: Normal Vent. rate 70 BPM PR interval 176 ms QRS duration 108 ms QT/QTc 476/514 ms Nonspecific intraventricular delay. ST segments: Nonspecific ST changes inferiorly    1. Epiploic appendagitis   2. Proximal superior mesenteric artery dissection     MDM  62 year old male with abdominal pain. CT significant for an epiploic appendigitis and this is most likely etiology of his symptoms. Appendix is also dilated, but without any surrounding inflammatory changes. Patient does have mild tenderness on  exam, but no guarding/rebound. The timing of pt's symptoms starting several weeks ago is no consistent with acute appendicitis. No leukocytosis. CT also makes mention of a focal dissection of his proximal superior mesenteric artery. Case was discussed with vascular surgery who reviewed the CT scan with radiology. From a vascular standpoint, he feels that patient is fine for discharge at this time. Would like to see patient in followup in 2 weeks with a repeat CT prior to this. This was discussed with patient and his family. Understands the plan. Questions answered to their satisfaction. Emergent return precautions were discussed. Plan symptomatic treatment at this time.        Raeford Razor, MD 11/25/12 1540

## 2012-12-03 ENCOUNTER — Other Ambulatory Visit (INDEPENDENT_AMBULATORY_CARE_PROVIDER_SITE_OTHER): Payer: Medicare Other | Admitting: *Deleted

## 2012-12-03 ENCOUNTER — Ambulatory Visit (INDEPENDENT_AMBULATORY_CARE_PROVIDER_SITE_OTHER): Payer: Medicare Other | Admitting: Surgery

## 2012-12-03 ENCOUNTER — Encounter: Payer: Self-pay | Admitting: Surgery

## 2012-12-03 ENCOUNTER — Ambulatory Visit
Admission: RE | Admit: 2012-12-03 | Discharge: 2012-12-03 | Disposition: A | Discharge status: Home / Self Care | Payer: Medicare Other | Source: Ambulatory Visit | Attending: Surgery | Admitting: Surgery

## 2012-12-03 VITALS — BP 153/82 | HR 69 | Ht 65.0 in | Wt 171.9 lb

## 2012-12-03 DIAGNOSIS — I7779 Dissection of other artery: Secondary | ICD-10-CM

## 2012-12-03 DIAGNOSIS — G459 Transient cerebral ischemic attack, unspecified: Secondary | ICD-10-CM | POA: Diagnosis not present

## 2012-12-03 DIAGNOSIS — R209 Unspecified disturbances of skin sensation: Secondary | ICD-10-CM

## 2012-12-03 DIAGNOSIS — R202 Paresthesia of skin: Secondary | ICD-10-CM

## 2012-12-03 MED ORDER — IOHEXOL 350 MG/ML SOLN
80.0000 mL | Freq: Once | INTRAVENOUS | Status: AC | PRN
  Administered 2012-12-03: 80 mL via INTRAVENOUS

## 2012-12-03 NOTE — Progress Notes (Signed)
[facility]   Patient name: Barry [name] MRN: [id number] DOB: [birth year] Sex: male   Referred by: Emergency department  Reason for referral:  Chief Complaint  Patient presents with  . New Evaluation    chronic superior mesenteric artery dissection     HISTORY OF PRESENT ILLNESS: This is a 62 year old gentleman who presented to the emergency department approximately 2 weeks ago with abdominal pain. He underwent a CT scan which showed a superior mesenteric artery dissection. He was diagnosed as having epiploic appendagitis involving the sigmoid colon. He was treated with pain medications and discharged. I had requested that he followup with me with a repeat scan with better arterial phase timing. He reports since his emergency department visit that his abdominal pain has nearly gone away. He does describe having occasional pain. This is not associated with eating. He can occur at any time. There are no aggravating or relieving factors. He denies having any weight loss. He denies blood in his stool. He has not had diarrhea.  The patient speaks [place]. He has a Nurse, learning disability with him. He does not have a primary medical doctor. He carries the diagnosis of diabetes and hypertension. He states he has had a stroke in the past. He also describes having tingling on the left side of his body. History of was 3 years ago.  Past Medical History  Diagnosis Date  . Hypertension   . Diabetes mellitus   . Stroke 09/26/2009    Syncope.  [facility]  ad[facility].    Past Surgical History  Procedure Laterality Date  . Admission  09/26/2009    CVA.  [facility].    History   Social History  . Marital Status: Single    Spouse Name: N/A    Number of Children: N/A  . Years of Education: N/A   Occupational History  . Not on file.   Social History Main Topics  . Smoking status: Former Smoker    Quit date: 06/09/2009  . Smokeless tobacco: Not on file  . Alcohol Use: No  .  Drug Use: No  . Sexually Active: Not Currently   Other Topics Concern  . Not on file   Social History Narrative   Marital: married.      Lives: with son,wife.       Children: 3 children; 6 grandchildren      Employed: unemployed; disability unknown reason/CVA L sided weakness      Tobacco:  Quit 2012; smoked 40 years.       Alcohol: no drinking now; social in past.       Drugs: none       Exercise: sporadic.       ADLs:  No driving since CVA.    History reviewed. No pertinent family history.  Allergies as of 12/03/2012  . (No Known Allergies)    Current Outpatient Prescriptions on File Prior to Visit  Medication Sig Dispense Refill  . amLODipine (NORVASC) 5 MG tablet Take 5 mg by mouth daily.      . metFORMIN (GLUCOPHAGE) 500 MG tablet Take 1 tablet (500 mg total) by mouth 2 (two) times daily with a meal.  180 tablet  1  . oxyCODONE-acetaminophen (PERCOCET/ROXICET) 5-325 MG per tablet Take 1-2 tablets by mouth every 4 (four) hours as needed for pain.  20 tablet  0   No current facility-administered medications on file prior to visit.     REVIEW OF SYSTEMS: Cardiovascular: No chest pain, chest pressure,  palpitations, orthopnea, or dyspnea on exertion. No claudication or rest pain,  No history of DVT or phlebitis. Positive for pain in his legs with walking Pulmonary: No productive cough, asthma or wheezing. Neurologic: Positive for numbness in his arms and legs Hematologic: No bleeding problems or clotting disorders. Musculoskeletal: No joint pain or joint swelling. Gastrointestinal: No blood in stool or hematemesis Genitourinary: No dysuria or hematuria. Psychiatric:: No history of major depression. Integumentary: No rashes or ulcers. Constitutional: No fever or chills.  PHYSICAL EXAMINATION: General: The patient appears their stated age.  Vital signs are BP 153/82  Pulse 69  Ht 5\' 5"  (1.651 m)  Wt 171 lb 14.4 oz (77.973 kg)  BMI 28.61 kg/m2  SpO2 98% HEENT:  No  gross abnormalities Pulmonary: Respirations are non-labored Abdomen: Soft and non-tender. No pulsatile mass.  Musculoskeletal: There are no major deformities.   Neurologic: No focal weakness or paresthesias are detected, Skin: There are no ulcer or rashes noted. Psychiatric: The patient has normal affect. Cardiovascular: There is a regular rate and rhythm without significant murmur appreciated. No carotid bruits. Palpable pedal pulses.  Diagnostic Studies: CT angiogram was ordered and repeated today. I have reviewed these images. This reveals a stable appearance of the dissection with the superior mesenteric artery. Maximum diameter is 1.4 mm. A left common iliac ectasia is also identified. There is also a area of penetrating ulcer at this level.  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 I do not feel that he is symptomatic from this. I have told the patient that this would be something that needs to be followed. The biggest concern is aneurysmal degeneration over time. For that reason I will bring him back in one year with a repeat ultrasound. I will also need to have his left common iliac artery evaluate for aneurysmal changes as well.  The patient has had some numbness on his left side. With his history of stroke, I felt that I needed to order a carotid Doppler study. This shows normal carotid arteries bilaterally. His numbness symptoms were potentially related to hypertension. Again we are trying to find a primary care physician to manage his chronic medical problems.  The patient does not have a primary care physician, my office will work with trying to get him established one.    V. Charlena Cross, M.D. [facility] [place] Office: [phone] Pager:   [phone]

## 2012-12-05 ENCOUNTER — Encounter: Payer: Self-pay | Admitting: Surgery

## 2012-12-05 ENCOUNTER — Telehealth: Payer: Self-pay | Admitting: Surgery

## 2012-12-05 NOTE — Telephone Encounter (Signed)
At check out on Monday 12/03/12, Dr. Myra Gianotti requested that we assist the patient in locating a PCP. I called several practices in our local area and at this time the only one that is accepting new patients with Medicare insurance is the [facility] [phone]. I attempted to call the patient's daughter Esa on 12/05/12 and again on 12/06/12 but she didn't answer and her voice mail wasn't set up [employer] I was unable to leave a message. I mailed a letter to the patient with the above information and the address and phone number of the facility. [email or web address]

## 2012-12-25 ENCOUNTER — Other Ambulatory Visit: Payer: Self-pay | Admitting: *Deleted

## 2012-12-25 DIAGNOSIS — I714 Abdominal aortic aneurysm, without rupture: Secondary | ICD-10-CM

## 2012-12-25 DIAGNOSIS — K551 Chronic vascular disorders of intestine: Secondary | ICD-10-CM

## 2013-10-28 ENCOUNTER — Other Ambulatory Visit: Payer: Self-pay | Admitting: Surgery

## 2013-10-28 DIAGNOSIS — I723 Aneurysm of iliac artery: Secondary | ICD-10-CM

## 2013-10-28 DIAGNOSIS — I7779 Dissection of other artery: Secondary | ICD-10-CM

## 2013-12-05 ENCOUNTER — Encounter: Payer: Self-pay | Admitting: Family

## 2013-12-06 ENCOUNTER — Ambulatory Visit (HOSPITAL_COMMUNITY)
Admission: RE | Admit: 2013-12-06 | Discharge: 2013-12-06 | Disposition: A | Discharge status: Home / Self Care | Payer: Medicare Other | Source: Ambulatory Visit | Attending: Family | Admitting: Family

## 2013-12-06 ENCOUNTER — Encounter: Payer: Self-pay | Admitting: Family

## 2013-12-06 ENCOUNTER — Ambulatory Visit (INDEPENDENT_AMBULATORY_CARE_PROVIDER_SITE_OTHER): Payer: Medicare Other | Admitting: Family

## 2013-12-06 VITALS — BP 183/106 | HR 60 | Resp 16 | Ht 65.0 in | Wt 177.0 lb

## 2013-12-06 DIAGNOSIS — I7779 Dissection of other artery: Secondary | ICD-10-CM | POA: Insufficient documentation

## 2013-12-06 DIAGNOSIS — R5383 Other fatigue: Secondary | ICD-10-CM | POA: Diagnosis not present

## 2013-12-06 DIAGNOSIS — I723 Aneurysm of iliac artery: Secondary | ICD-10-CM | POA: Diagnosis not present

## 2013-12-06 DIAGNOSIS — I714 Abdominal aortic aneurysm, without rupture, unspecified: Secondary | ICD-10-CM | POA: Diagnosis not present

## 2013-12-06 DIAGNOSIS — R531 Weakness: Secondary | ICD-10-CM | POA: Insufficient documentation

## 2013-12-06 DIAGNOSIS — I771 Stricture of artery: Secondary | ICD-10-CM

## 2013-12-06 DIAGNOSIS — M79609 Pain in unspecified limb: Secondary | ICD-10-CM | POA: Diagnosis not present

## 2013-12-06 DIAGNOSIS — R5381 Other malaise: Secondary | ICD-10-CM | POA: Diagnosis not present

## 2013-12-06 DIAGNOSIS — K551 Chronic vascular disorders of intestine: Secondary | ICD-10-CM

## 2013-12-06 MED ORDER — OXYCODONE-ACETAMINOPHEN 5-325 MG PO TABS
1.0000 | ORAL_TABLET | ORAL | Status: DC | PRN
Start: 2013-12-06 — End: 2014-02-26

## 2013-12-06 NOTE — Progress Notes (Signed)
Established Mesenteric Ischemia  History of Present Illness  Barry [name] is a 63 y.o. (05/26/1951) male patient of Dr. Trula Slade who presented to the emergency department in February, 2014 with abdominal pain. He underwent a CT scan which showed a superior mesenteric artery dissection. He was diagnosed as having epiploic appendagitis involving the sigmoid colon. He was treated with pain medications and discharged. At that time Dr. Trula Slade requested that the patietn followup with him with a repeat scan with better arterial phase timing. He reports since his emergency department visit that his abdominal pain has nearly gone away. He does describe having occasional pain. This is not associated with eating. He can occur at any time. There are no aggravating or relieving factors. He denies having any weight loss. He denies blood in his stool. He has not had diarrhea. He returns today for Duplex imaging of AAA and mesenteric arteries.   Chronic superior mesenteric artery dissection  Left common iliac artery penetrating ulcer   On 12/03/2012 visit Dr. Trula Slade felt that the patient's superior mesenteric artery dissection was a chronic finding. He did not feel that pt is symptomatic from this. He told the patient that this would be something that needs to be followed. The biggest concern is aneurysmal degeneration over time. For that reason pt return in one year with a repeat ultrasound; will also need to have his left common iliac artery evaluate for aneurysmal changes as well.  The patient has had some numbness on his left side. With his history of stroke, it was felt that a carotid Doppler study was needed. This shows normal carotid arteries bilaterally. His numbness symptoms were potentially related to hypertension. Again we are trying to find a primary care physician to manage his chronic medical problems.  The patient does not have a primary care physician, this office will work with trying to get him  established one.  Stomach pain is bad, slightly alleviated by walking. Is taking daily oxycodone which adequately controlled his pain, but he ran out.  He complains of being very thirsty at night, wants to know why.  He states his parents always complained about stomach pain, also had starvation issues, due to the [employer] in [place]. Daughter is translating.  RLQ pain while eating, pain stops after he stops eating. Sometimes he has pain when not eating, constant pain.  He has constipation, last BM was 3 days ago. He does state weight loss, 3-5 pounds in the last month.  Abdominal pain started 5 years ago, progressively worsening.  His abdominal pain is immobilizing, cannot eat or do anything, according to daughter. He was given a referral at his last visit for a PCP, no appointment made, advised to make appointment. Noted that U/A was negative in Feb., 2014, for UTI. He had a stroke in 2011, he has residual weakness in left and and leg.   He has had low back pain for about 5 years with bilateral radiculopathy, left is worse. He is not on an antiplatelet medication nor a statin.  Past Medical History  Diagnosis Date  . Hypertension   . Diabetes mellitus   . Stroke 09/26/2009    Syncope.  [facility]  admission.   History   Social History  . Marital Status: Single    Spouse Name: N/A    Number of Children: N/A  . Years of Education: N/A   Occupational History  . Not on file.   Social History Main Topics  . Smoking status:  Former Smoker    Quit date: 06/09/2009  . Smokeless tobacco: Not on file  . Alcohol Use: No  . Drug Use: No  . Sexual Activity: Not Currently   Other Topics Concern  . Not on file   Social History Narrative   Marital: married.      Lives: with son,wife.       Children: 3 children; 6 grandchildren      Employed: unemployed; disability unknown reason/CVA L sided weakness      Tobacco:  Quit 2012; smoked 40 years.       Alcohol: no drinking now;  social in past.       Drugs: none       Exercise: sporadic.       ADLs:  No driving since CVA.   Active Ambulatory Problems    Diagnosis Date Noted  . Hypertension 11/10/2011  . CVA (cerebral vascular accident) 11/10/2011  . Language Barrier 11/10/2011  . Diabetes mellitus 11/10/2011  . Essential hypertension, benign 08/02/2012  . Type 2 diabetes mellitus 08/02/2012  . Dissection of mesenteric artery 12/03/2012   Resolved Ambulatory Problems    Diagnosis Date Noted  . No Resolved Ambulatory Problems   Past Medical History  Diagnosis Date  . Diabetes mellitus   . Stroke 09/26/2009    Past Surgical History  Procedure Laterality Date  . Admission  09/26/2009    CVA.  [name].   History   Social History  . Marital Status: Single    Spouse Name: N/A    Number of Children: N/A  . Years of Education: N/A   Occupational History  . Not on file.   Social History Main Topics  . Smoking status: Former Smoker    Quit date: 06/09/2009  . Smokeless tobacco: Not on file  . Alcohol Use: No  . Drug Use: No  . Sexual Activity: Not Currently   Other Topics Concern  . Not on file   Social History Narrative   Marital: married.      Lives: with son,wife.       Children: 3 children; 6 grandchildren      Employed: unemployed; disability unknown reason/CVA L sided weakness      Tobacco:  Quit 2012; smoked 40 years.       Alcohol: no drinking now; social in past.       Drugs: none       Exercise: sporadic.       ADLs:  No driving since CVA.    No family history on file.  Current Outpatient Prescriptions on File Prior to Visit  Medication Sig Dispense Refill  . amLODipine (NORVASC) 5 MG tablet Take 5 mg by mouth daily.      . metFORMIN (GLUCOPHAGE) 500 MG tablet Take 1 tablet (500 mg total) by mouth 2 (two) times daily with a meal.  180 tablet  1  . oxyCODONE-acetaminophen (PERCOCET/ROXICET) 5-325 MG per tablet Take 1-2 tablets by mouth every 4 (four) hours as needed for  pain.  20 tablet  0   No current facility-administered medications on file prior to visit.   No Known Allergies   On ROS today: see HPI for pertinent positives and negatives.   Physical Examination  Filed Vitals:   12/06/13 1054  BP: 183/106  Pulse: 60  Resp: 16   Filed Weights   12/06/13 1054  Weight: 177 lb (80.287 kg)   Body mass index is 29.45 kg/(m^2).  General: A&O x 3, WDWN  Pulmonary:  Sym exp, good air movt, CTAB, no rales, rhonchi, or wheezing.  Cardiac: RRR, Nl S1, S2, no Murmur detected.  Vascular: Vessel Right Left  Radial Palpable Palpable  Carotid  without bruit  without bruit  Aorta Not palpable N/A  Femoral Palpable Palpable  Popliteal Not palpable Not palpable  PT Palpable Palpable  DP Palpable Palpable   Gastrointestinal: soft, - masses,  Bilateral flank tenderness to palpation, RLQ moderate tenderness to palpation.   Musculoskeletal: M/S 5/5 in right arm and leg, 4/5 in left arm and leg, Extremities without ischemic changes.  Neurologic: Pain and light touch intact in extremities, Motor exam as listed above  Non-Invasive Vascular Imaging  Mesenteric Duplex (Date: 12/06/2013):  MESENTERIC ARTERY DUPLEX EVALUATION    INDICATION: Follow up mesenteric dissection per CT    PREVIOUS INTERVENTION(S): Dissection involving the SMA 3 cm from takeoff per CT 12/03/12 with aneurysmal dilitation    DUPLEX EXAM: Mesenteric duplex    ARTERY PEAK SYSTOLIC VELOCITY (cm/s) IMAGE  Aorta 56 Patent  Celiac 196 Patent  Superior Mesenteric Artery - Proximal 185 Patent  Superior Mesenteric Artery - Mid 246 Patent  Inferior Mesenteric Artery 131 N/V  Hepatic  -  Splenic  -     ADDITIONAL FINDINGS:     IMPRESSION: 1. Technically difficult exam due to bowel gas and language barrier. 2. There appears to be chronic plaque 3.0 cm from the superior mesenteric origin with an aneurysmal dilatation of approximately 1.29 cm noted. 3. Patent superior mesenteric  artery.     ABDOMINAL AORTA DUPLEX EVALUATION    INDICATION: Follow up left iliac artery    PREVIOUS INTERVENTION(S): Irregular plaque with a penetrating atherosclerotic ulcer or focal non-flow limiting dissection per CT 12/03/2012 or the left common iliac artery.    DUPLEX EXAM: Aorta duplex limited    LOCATION DIAMETER AP (cm) DIAMETER TRANSVERSE (cm) VELOCITIES (cm/sec)  Aorta Proximal 2.2 2.1 62  Aorta Mid 1.9 2.1 58  Aorta Distal 1.7 1.6 88  Right Common Iliac Artery 1.0 0.89 106  Left Common Iliac Artery 1.1 - 62    Previous max aortic diameter:  No prior exam Date:      ADDITIONAL FINDINGS: Mild plaque noted in the left common iliac artery    IMPRESSION: 1. Technically difficult exam due to bowel gas and language barrier. 2. No aortic aneurysm visualized. 3. The left common iliac artery not well visualized but appears patent, unable to visualized ulcer/dissection.    Compared to the previous exam:  None     Medical Decision Making  Barry [name] is a 63 y.o. male who presents with chronic superior mesenteric artery dissection and left common iliac artery penetrating ulcer . Technically difficult exam due to bowel gas and language barrier. There appears to be chronic plaque 3.0 cm from the superior mesenteric origin with an aneurysmal dilatation of approximately 1.29 cm noted. Patent superior mesenteric artery. Irregular plaque with a penetrating atherosclerotic ulcer or focal non-flow limiting dissection per CT 12/03/2012 or the left common iliac artery.    Start daily 81 mg ASA. Will defer to PCP to start statin and monitor lipids. Renewed oxycodone/APAP, 5-325, 1-2 tabs every 4 hours as needed, disp #20, 0 refills. Has constipation from narcotic analgesic use. Advised daily stool softener, and Miralax prn.   Based on exam and studies, and after discussing with Dr. Bridgett Larsson, follow up with Dr. Trula Slade on Monday.  I discussed in depth with the patient the nature of  atherosclerosis, and emphasized the importance  of maximal medical management including strict control of blood pressure, blood glucose, and lipid levels, obtaining regular exercise, and cessation of smoking.    The patient is aware that without maximal medical management the underlying atherosclerotic disease process will progress, limiting the benefit of any interventions. The patient is currently not on a statin.  Will defer to his PCP to start and monitor lipids and hepatic function.  The patient is currently not on an anti-platelet.  The patient will be started on ASA 81 mg PO daily.  Thank you for allowing [place] to participate in this patient's care.  Clemon Chambers, RN, MSN, FNP-C [facility] Office: [phone]  Clinic MD: Bridgett Larsson  12/06/2013, 9:17 AM

## 2013-12-06 NOTE — Patient Instructions (Addendum)
Chronic Mesenteric Ischemia Mesenteric ischemia is a deficiency of blood in an area of the intestine supplied by an artery that supports the intestine. Chronic mesenteric ischemia, also called intestinal angina, is a long-term condition. It happens when an artery or vein that supports the intestine gradually becomes blocked or narrow, restricting the blood supply to the intestine. When the blood supply to the intestine is severely restricted, the intestines cannot function properly because needed oxygen cannot reach them.  CAUSES   Fatty deposits that build up in an artery or vein but have not yet restricted blood flow entirely.  Differences in some people's anatomy.  Rapid weight loss.  Weakened areas in blood vessel walls (aneurysms).  Swelling and inflammation of blood vessels (such as from fibromuscular dysplasia and arteritis).  Disorders of blood clotting.  Scarring and fibrosis of blood vessels after radiation therapy.  Blood vessel problems after drug use, such as use of cocaine. RISK FACTORS  Being male.  Being over age 67 with a history of coronary or vascular disease.  Smoking.  Congestive heart failure.  Diabetes.  High cholesterol.  High blood pressure (hypertension). SIGNS AND SYMPTOMS   Severe stomachache. Some people become fearful of eating because of pain.   Abdominal pain or cramps that develop about 30 minutes after a meal.   Abdominal pain after eating that becomes worse over time.   Diarrhea.   Nausea.   Vomiting.   Bloating.   Weight loss. DIAGNOSIS  Chronic mesenteric ischemia is often diagnosed after the person's history is taken, a physical exam is done, and tests are taken. Tests may include:  Ultrasounds.  CT scans.  Angiography. This is an imaging test that uses a dye to obtain a picture of blood flow to the intestine.  Endoscopy. This involves putting a scope through the mouth, down the throat, and into the stomach and  intestine to view the intestinal wall and take small tissue samples (biopsies).  Tonometry. In this test a tiny probe is passed through the mouth and into the stomach or intestine and left in place for 24 hours or more. It measures the output of carbon dioxide by the affected tissues. TREATMENT  Treatment may include:   Medicines to reduce blood clotting and increase blood flow.   Surgery to remove the blockage, repair arteries or veins, and restore blood flow. This may involve:   Angioplasty. This is surgery to widen the affected artery, reduce the blockage, and sometimes insert a small, mesh tube (stent).   Bypass surgery. This may be performed to bypass the blockage and reconnect healthy arteries or veins.   A stent in the affected area to help keep blocked arteries open. HOME CARE INSTRUCTIONS  Only take over-the-counter or prescription medicines as directed by your health care provider.   Keep all follow-up appointments as directed by your health care provider.   Prevent the condition from occurring by:  Doing regular exercise.  Keeping a healthy weight.  Keeping a healthy diet.  Managing cholesterol levels.  Keeping blood pressure and heart rhythm problems under control.  Not smoking. SEEK IMMEDIATE MEDICAL CARE IF:  You have severe abdominal pain.   You notice blood in your stool.   You have nausea, vomiting, or diarrhea.   You have a fever. MAKE SURE YOU:  Understand these instructions.  Will watch your condition.  Will get help right away if you are not doing well or get worse. Document Released: 05/02/2011 Document Revised: 05/15/2013 Document Reviewed: 03/13/2013 ExitCare  Patient Information 2014 [place].   Constipation, Adult Constipation is when a person has fewer than 3 bowel movements a week; has difficulty having a bowel movement; or has stools that are dry, hard, or larger than normal. As people grow older, constipation is more  common. If you try to fix constipation with medicines that make you have a bowel movement (laxatives), the problem may get worse. Long-term laxative use may cause the muscles of the colon to become weak. A low-fiber diet, not taking in enough fluids, and taking certain medicines may make constipation worse. CAUSES   Certain medicines, such as antidepressants, pain medicine, iron supplements, antacids, and water pills.   Certain diseases, such as diabetes, irritable bowel syndrome (IBS), thyroid disease, or depression.   Not drinking enough water.   Not eating enough fiber-rich foods.   Stress or travel.  Lack of physical activity or exercise.  Not going to the restroom when there is the urge to have a bowel movement.  Ignoring the urge to have a bowel movement.  Using laxatives too much. SYMPTOMS   Having fewer than 3 bowel movements a week.   Straining to have a bowel movement.   Having hard, dry, or larger than normal stools.   Feeling full or bloated.   Pain in the lower abdomen.  Not feeling relief after having a bowel movement. DIAGNOSIS  Your caregiver will take a medical history and perform a physical exam. Further testing may be done for severe constipation. Some tests may include:   A barium enema X-ray to examine your rectum, colon, and sometimes, your small intestine.  A sigmoidoscopy to examine your lower colon.  A colonoscopy to examine your entire colon. TREATMENT  Treatment will depend on the severity of your constipation and what is causing it. Some dietary treatments include drinking more fluids and eating more fiber-rich foods. Lifestyle treatments may include regular exercise. If these diet and lifestyle recommendations do not help, your caregiver may recommend taking over-the-counter laxative medicines to help you have bowel movements. Prescription medicines may be prescribed if over-the-counter medicines do not work.  HOME CARE INSTRUCTIONS    Increase dietary fiber in your diet, such as fruits, vegetables, whole grains, and beans. Limit high-fat and processed sugars in your diet, such as [place] fries, hamburgers, cookies, candies, and soda.   A fiber supplement may be added to your diet if you cannot get enough fiber from foods.   Drink enough fluids to keep your urine clear or pale yellow.   Exercise regularly or as directed by your caregiver.   Go to the restroom when you have the urge to go. Do not hold it.  Only take medicines as directed by your caregiver. Do not take other medicines for constipation without talking to your caregiver first. [place] IF:   You have bright red blood in your stool.   Your constipation lasts for more than 4 days or gets worse.   You have abdominal or rectal pain.   You have thin, pencil-like stools.  You have unexplained weight loss. MAKE SURE YOU:   Understand these instructions.  Will watch your condition.  Will get help right away if you are not doing well or get worse. Document Released: 06/10/2004 Document Revised: 12/05/2011 Document Reviewed: 06/24/2013 [facility] Patient Information 2014 [place], [place].   Use a stool softener daily to prevent constipation, over the counter, bisocodyl. Use Miralax as needed and as directed on the bottle to relieve constipation.

## 2013-12-09 ENCOUNTER — Ambulatory Visit: Payer: Medicare Other | Admitting: Family

## 2013-12-09 ENCOUNTER — Other Ambulatory Visit: Payer: Medicare Other

## 2013-12-13 ENCOUNTER — Encounter: Payer: Self-pay | Admitting: Surgery

## 2013-12-16 ENCOUNTER — Ambulatory Visit: Payer: Medicare Other | Admitting: Surgery

## 2013-12-26 ENCOUNTER — Encounter: Payer: Self-pay | Admitting: Surgery

## 2013-12-30 ENCOUNTER — Ambulatory Visit: Payer: Medicare Other | Admitting: Surgery

## 2014-01-27 ENCOUNTER — Ambulatory Visit: Payer: Medicare Other | Admitting: Surgery

## 2014-02-03 ENCOUNTER — Ambulatory Visit: Payer: Medicare Other | Admitting: Surgery

## 2014-02-26 ENCOUNTER — Inpatient Hospital Stay (HOSPITAL_COMMUNITY)
Admission: EM | Admit: 2014-02-26 | Discharge: 2014-02-28 | DRG: 246 | Disposition: A | Discharge status: Home / Self Care | Payer: Medicare Other | Attending: Cardiology | Admitting: Cardiology

## 2014-02-26 ENCOUNTER — Emergency Department (HOSPITAL_COMMUNITY): Payer: Medicare Other

## 2014-02-26 ENCOUNTER — Encounter (HOSPITAL_COMMUNITY)
Admission: EM | Disposition: A | Discharge status: Home / Self Care | Payer: Medicare Other | Source: Home / Self Care | Attending: Cardiology

## 2014-02-26 ENCOUNTER — Ambulatory Visit (HOSPITAL_COMMUNITY): Admit: 2014-02-26 | Payer: Medicare Other | Admitting: Cardiovascular Disease

## 2014-02-26 DIAGNOSIS — I714 Abdominal aortic aneurysm, without rupture, unspecified: Secondary | ICD-10-CM

## 2014-02-26 DIAGNOSIS — R7989 Other specified abnormal findings of blood chemistry: Secondary | ICD-10-CM

## 2014-02-26 DIAGNOSIS — I771 Stricture of artery: Secondary | ICD-10-CM

## 2014-02-26 DIAGNOSIS — R531 Weakness: Secondary | ICD-10-CM

## 2014-02-26 DIAGNOSIS — I252 Old myocardial infarction: Secondary | ICD-10-CM | POA: Diagnosis present

## 2014-02-26 DIAGNOSIS — I4729 Other ventricular tachycardia: Secondary | ICD-10-CM | POA: Diagnosis not present

## 2014-02-26 DIAGNOSIS — I2510 Atherosclerotic heart disease of native coronary artery without angina pectoris: Secondary | ICD-10-CM | POA: Diagnosis present

## 2014-02-26 DIAGNOSIS — I5033 Acute on chronic diastolic (congestive) heart failure: Secondary | ICD-10-CM | POA: Diagnosis not present

## 2014-02-26 DIAGNOSIS — I959 Hypotension, unspecified: Secondary | ICD-10-CM | POA: Diagnosis present

## 2014-02-26 DIAGNOSIS — Z8249 Family history of ischemic heart disease and other diseases of the circulatory system: Secondary | ICD-10-CM | POA: Diagnosis not present

## 2014-02-26 DIAGNOSIS — I214 Non-ST elevation (NSTEMI) myocardial infarction: Principal | ICD-10-CM

## 2014-02-26 DIAGNOSIS — Z87891 Personal history of nicotine dependence: Secondary | ICD-10-CM

## 2014-02-26 DIAGNOSIS — E785 Hyperlipidemia, unspecified: Secondary | ICD-10-CM | POA: Diagnosis present

## 2014-02-26 DIAGNOSIS — I10 Essential (primary) hypertension: Secondary | ICD-10-CM | POA: Diagnosis present

## 2014-02-26 DIAGNOSIS — Z91199 Patient's noncompliance with other medical treatment and regimen due to unspecified reason: Secondary | ICD-10-CM

## 2014-02-26 DIAGNOSIS — I509 Heart failure, unspecified: Secondary | ICD-10-CM | POA: Diagnosis present

## 2014-02-26 DIAGNOSIS — I5030 Unspecified diastolic (congestive) heart failure: Secondary | ICD-10-CM

## 2014-02-26 DIAGNOSIS — Z8673 Personal history of transient ischemic attack (TIA), and cerebral infarction without residual deficits: Secondary | ICD-10-CM | POA: Diagnosis present

## 2014-02-26 DIAGNOSIS — I739 Peripheral vascular disease, unspecified: Secondary | ICD-10-CM | POA: Diagnosis present

## 2014-02-26 DIAGNOSIS — R799 Abnormal finding of blood chemistry, unspecified: Secondary | ICD-10-CM

## 2014-02-26 DIAGNOSIS — I639 Cerebral infarction, unspecified: Secondary | ICD-10-CM

## 2014-02-26 DIAGNOSIS — I5023 Acute on chronic systolic (congestive) heart failure: Secondary | ICD-10-CM | POA: Diagnosis present

## 2014-02-26 DIAGNOSIS — N189 Chronic kidney disease, unspecified: Secondary | ICD-10-CM | POA: Diagnosis present

## 2014-02-26 DIAGNOSIS — R29898 Other symptoms and signs involving the musculoskeletal system: Secondary | ICD-10-CM | POA: Diagnosis present

## 2014-02-26 DIAGNOSIS — R001 Bradycardia, unspecified: Secondary | ICD-10-CM | POA: Diagnosis present

## 2014-02-26 DIAGNOSIS — I498 Other specified cardiac arrhythmias: Secondary | ICD-10-CM | POA: Diagnosis not present

## 2014-02-26 DIAGNOSIS — R0989 Other specified symptoms and signs involving the circulatory and respiratory systems: Secondary | ICD-10-CM | POA: Diagnosis not present

## 2014-02-26 DIAGNOSIS — K551 Chronic vascular disorders of intestine: Secondary | ICD-10-CM | POA: Diagnosis present

## 2014-02-26 DIAGNOSIS — I6529 Occlusion and stenosis of unspecified carotid artery: Secondary | ICD-10-CM | POA: Diagnosis present

## 2014-02-26 DIAGNOSIS — I129 Hypertensive chronic kidney disease with stage 1 through stage 4 chronic kidney disease, or unspecified chronic kidney disease: Secondary | ICD-10-CM | POA: Diagnosis present

## 2014-02-26 DIAGNOSIS — E118 Type 2 diabetes mellitus with unspecified complications: Secondary | ICD-10-CM | POA: Diagnosis present

## 2014-02-26 DIAGNOSIS — I7779 Dissection of other artery: Secondary | ICD-10-CM

## 2014-02-26 DIAGNOSIS — Z603 Acculturation difficulty: Secondary | ICD-10-CM

## 2014-02-26 DIAGNOSIS — I6350 Cerebral infarction due to unspecified occlusion or stenosis of unspecified cerebral artery: Secondary | ICD-10-CM | POA: Diagnosis not present

## 2014-02-26 DIAGNOSIS — Z789 Other specified health status: Secondary | ICD-10-CM

## 2014-02-26 DIAGNOSIS — IMO0001 Reserved for inherently not codable concepts without codable children: Secondary | ICD-10-CM | POA: Diagnosis present

## 2014-02-26 DIAGNOSIS — E119 Type 2 diabetes mellitus without complications: Secondary | ICD-10-CM

## 2014-02-26 DIAGNOSIS — R5381 Other malaise: Secondary | ICD-10-CM | POA: Diagnosis present

## 2014-02-26 DIAGNOSIS — Z833 Family history of diabetes mellitus: Secondary | ICD-10-CM

## 2014-02-26 DIAGNOSIS — R5383 Other fatigue: Secondary | ICD-10-CM | POA: Diagnosis present

## 2014-02-26 DIAGNOSIS — I69998 Other sequelae following unspecified cerebrovascular disease: Secondary | ICD-10-CM

## 2014-02-26 DIAGNOSIS — G459 Transient cerebral ischemic attack, unspecified: Secondary | ICD-10-CM

## 2014-02-26 DIAGNOSIS — R778 Other specified abnormalities of plasma proteins: Secondary | ICD-10-CM

## 2014-02-26 DIAGNOSIS — I708 Atherosclerosis of other arteries: Secondary | ICD-10-CM | POA: Diagnosis not present

## 2014-02-26 DIAGNOSIS — E1165 Type 2 diabetes mellitus with hyperglycemia: Secondary | ICD-10-CM

## 2014-02-26 DIAGNOSIS — I472 Ventricular tachycardia: Secondary | ICD-10-CM | POA: Diagnosis not present

## 2014-02-26 DIAGNOSIS — Z9119 Patient's noncompliance with other medical treatment and regimen: Secondary | ICD-10-CM

## 2014-02-26 DIAGNOSIS — I5032 Chronic diastolic (congestive) heart failure: Secondary | ICD-10-CM | POA: Diagnosis present

## 2014-02-26 DIAGNOSIS — I369 Nonrheumatic tricuspid valve disorder, unspecified: Secondary | ICD-10-CM | POA: Diagnosis not present

## 2014-02-26 DIAGNOSIS — IMO0002 Reserved for concepts with insufficient information to code with codable children: Secondary | ICD-10-CM

## 2014-02-26 DIAGNOSIS — R079 Chest pain, unspecified: Secondary | ICD-10-CM | POA: Diagnosis not present

## 2014-02-26 DIAGNOSIS — R930 Abnormal findings on diagnostic imaging of skull and head, not elsewhere classified: Secondary | ICD-10-CM | POA: Diagnosis not present

## 2014-02-26 HISTORY — PX: LEFT HEART CATHETERIZATION WITH CORONARY ANGIOGRAM: SHX5451

## 2014-02-26 LAB — I-STAT CHEM 8, ED
BUN: 15 mg/dL (ref 6–23)
Calcium, Ion: 1.14 mmol/L (ref 1.13–1.30)
Chloride: 100 mEq/L (ref 96–112)
Creatinine, Ser: 1.2 mg/dL (ref 0.50–1.35)
Glucose, Bld: 194 mg/dL — ABNORMAL HIGH (ref 70–99)
HCT: 50 % (ref 39.0–52.0)
Hemoglobin: 17 g/dL (ref 13.0–17.0)
Potassium: 3.6 mEq/L — ABNORMAL LOW (ref 3.7–5.3)
Sodium: 143 mEq/L (ref 137–147)
TCO2: 27 mmol/L (ref 0–100)

## 2014-02-26 LAB — BASIC METABOLIC PANEL
BUN: 14 mg/dL (ref 6–23)
CO2: 25 mEq/L (ref 19–32)
Calcium: 8.9 mg/dL (ref 8.4–10.5)
Chloride: 103 mEq/L (ref 96–112)
Creatinine, Ser: 1.04 mg/dL (ref 0.50–1.35)
GFR calc Af Amer: 86 mL/min — ABNORMAL LOW (ref >90)
GFR calc non Af Amer: 74 mL/min — ABNORMAL LOW (ref >90)
Glucose, Bld: 193 mg/dL — ABNORMAL HIGH (ref 70–99)
Potassium: 3.7 mEq/L (ref 3.7–5.3)
Sodium: 138 mEq/L (ref 137–147)

## 2014-02-26 LAB — CBC
HCT: 45 % (ref 39.0–52.0)
Hemoglobin: 15.2 g/dL (ref 13.0–17.0)
MCH: 29.5 pg (ref 26.0–34.0)
MCHC: 33.8 g/dL (ref 30.0–36.0)
MCV: 87.2 fL (ref 78.0–100.0)
Platelets: 193 10*3/uL (ref 150–400)
RBC: 5.16 MIL/uL (ref 4.22–5.81)
RDW: 13 % (ref 11.5–15.5)
WBC: 9.9 10*3/uL (ref 4.0–10.5)

## 2014-02-26 LAB — MRSA PCR SCREENING: MRSA by PCR: NEGATIVE

## 2014-02-26 LAB — I-STAT TROPONIN, ED: Troponin i, poc: 0.12 ng/mL (ref 0.00–0.08)

## 2014-02-26 LAB — GLUCOSE, CAPILLARY: Glucose-Capillary: 162 mg/dL — ABNORMAL HIGH (ref 70–99)

## 2014-02-26 LAB — TROPONIN I
Troponin I: 6.8 ng/mL (ref <0.30)
Troponin I: >20 ng/mL (ref <0.30)

## 2014-02-26 LAB — POCT ACTIVATED CLOTTING TIME: Activated Clotting Time: 930 seconds

## 2014-02-26 LAB — PRO B NATRIURETIC PEPTIDE: Pro B Natriuretic peptide (BNP): 324.7 pg/mL — ABNORMAL HIGH (ref 0–125)

## 2014-02-26 SURGERY — LEFT HEART CATHETERIZATION WITH CORONARY ANGIOGRAM
Anesthesia: LOCAL

## 2014-02-26 MED ORDER — ASPIRIN 81 MG PO CHEW
324.0000 mg | CHEWABLE_TABLET | ORAL | Status: AC
  Administered 2014-02-26: 324 mg via ORAL

## 2014-02-26 MED ORDER — SODIUM CHLORIDE 0.9 % IV SOLN
INTRAVENOUS | Status: AC
  Administered 2014-02-26: 17:00:00 via INTRAVENOUS

## 2014-02-26 MED ORDER — CLOPIDOGREL BISULFATE 75 MG PO TABS
75.0000 mg | ORAL_TABLET | Freq: Every day | ORAL | Status: DC
  Administered 2014-02-27: 75 mg via ORAL
  Filled 2014-02-26: qty 1

## 2014-02-26 MED ORDER — LISINOPRIL 20 MG PO TABS
20.0000 mg | ORAL_TABLET | Freq: Two times a day (BID) | ORAL | Status: DC
  Administered 2014-02-26 – 2014-02-28 (×4): 20 mg via ORAL
  Filled 2014-02-26 (×5): qty 1

## 2014-02-26 MED ORDER — SODIUM CHLORIDE 0.9 % IV SOLN: INTRAVENOUS | Status: AC

## 2014-02-26 MED ORDER — CLOPIDOGREL BISULFATE 300 MG PO TABS
ORAL_TABLET | ORAL | Status: AC
  Filled 2014-02-26: qty 2

## 2014-02-26 MED ORDER — NITROGLYCERIN IN D5W 200-5 MCG/ML-% IV SOLN
2.0000 ug/min | INTRAVENOUS | Status: DC
  Administered 2014-02-26: 10 ug/min via INTRAVENOUS
  Filled 2014-02-26: qty 250

## 2014-02-26 MED ORDER — IOHEXOL 350 MG/ML SOLN
100.0000 mL | Freq: Once | INTRAVENOUS | Status: AC | PRN
  Administered 2014-02-26: 100 mL via INTRAVENOUS

## 2014-02-26 MED ORDER — LIDOCAINE HCL (PF) 1 % IJ SOLN
INTRAMUSCULAR | Status: AC
  Filled 2014-02-26: qty 30

## 2014-02-26 MED ORDER — MIDAZOLAM HCL 2 MG/2ML IJ SOLN
INTRAMUSCULAR | Status: AC
  Filled 2014-02-26: qty 2

## 2014-02-26 MED ORDER — ATORVASTATIN CALCIUM 80 MG PO TABS
80.0000 mg | ORAL_TABLET | Freq: Every day | ORAL | Status: DC
  Administered 2014-02-27: 80 mg via ORAL
  Filled 2014-02-26 (×2): qty 1

## 2014-02-26 MED ORDER — HEPARIN (PORCINE) IN NACL 2-0.9 UNIT/ML-% IJ SOLN
INTRAMUSCULAR | Status: AC
  Filled 2014-02-26: qty 1000

## 2014-02-26 MED ORDER — INSULIN ASPART 100 UNIT/ML ~~LOC~~ SOLN
0.0000 [IU] | Freq: Three times a day (TID) | SUBCUTANEOUS | Status: DC
  Administered 2014-02-27: 3 [IU] via SUBCUTANEOUS
  Administered 2014-02-27 (×2): 2 [IU] via SUBCUTANEOUS

## 2014-02-26 MED ORDER — ATROPINE SULFATE 0.4 MG/ML IJ SOLN
0.4000 mg | Freq: Once | INTRAMUSCULAR | Status: DC
  Filled 2014-02-26: qty 1

## 2014-02-26 MED ORDER — BIVALIRUDIN 250 MG IV SOLR
INTRAVENOUS | Status: AC
  Filled 2014-02-26: qty 250

## 2014-02-26 MED ORDER — ASPIRIN 81 MG PO CHEW
324.0000 mg | CHEWABLE_TABLET | Freq: Once | ORAL | Status: DC
  Filled 2014-02-26: qty 4

## 2014-02-26 MED ORDER — HEPARIN SODIUM (PORCINE) 1000 UNIT/ML IJ SOLN
INTRAMUSCULAR | Status: AC
  Filled 2014-02-26: qty 1

## 2014-02-26 MED ORDER — ENOXAPARIN SODIUM 40 MG/0.4ML ~~LOC~~ SOLN
40.0000 mg | SUBCUTANEOUS | Status: DC
  Administered 2014-02-27: 40 mg via SUBCUTANEOUS
  Filled 2014-02-26 (×2): qty 0.4

## 2014-02-26 MED ORDER — HEPARIN (PORCINE) IN NACL 100-0.45 UNIT/ML-% IJ SOLN
900.0000 [IU]/h | INTRAMUSCULAR | Status: DC
  Filled 2014-02-26: qty 250

## 2014-02-26 MED ORDER — HEPARIN BOLUS VIA INFUSION
4000.0000 [IU] | Freq: Once | INTRAVENOUS | Status: DC
  Filled 2014-02-26: qty 4000

## 2014-02-26 MED ORDER — FENTANYL CITRATE 0.05 MG/ML IJ SOLN
INTRAMUSCULAR | Status: AC
  Filled 2014-02-26: qty 2

## 2014-02-26 MED ORDER — INSULIN ASPART 100 UNIT/ML ~~LOC~~ SOLN: 0.0000 [IU] | Freq: Every day | SUBCUTANEOUS | Status: DC

## 2014-02-26 MED ORDER — ASPIRIN 300 MG RE SUPP
300.0000 mg | RECTAL | Status: AC
Start: 2014-02-26 — End: 2014-02-26
  Filled 2014-02-26: qty 1

## 2014-02-26 MED ORDER — MORPHINE SULFATE 4 MG/ML IJ SOLN
4.0000 mg | Freq: Once | INTRAMUSCULAR | Status: AC
  Administered 2014-02-26: 4 mg via INTRAVENOUS
  Filled 2014-02-26: qty 1

## 2014-02-26 MED ORDER — VERAPAMIL HCL 2.5 MG/ML IV SOLN
INTRAVENOUS | Status: AC
  Filled 2014-02-26: qty 2

## 2014-02-26 MED ORDER — NITROGLYCERIN 0.2 MG/ML ON CALL CATH LAB
INTRAVENOUS | Status: AC
  Filled 2014-02-26: qty 1

## 2014-02-26 MED ORDER — MORPHINE SULFATE 4 MG/ML IJ SOLN: 4.0000 mg | Freq: Once | INTRAMUSCULAR | Status: DC

## 2014-02-26 MED ORDER — ASPIRIN EC 81 MG PO TBEC
81.0000 mg | DELAYED_RELEASE_TABLET | Freq: Every day | ORAL | Status: DC
  Administered 2014-02-27 – 2014-02-28 (×2): 81 mg via ORAL
  Filled 2014-02-26 (×2): qty 1

## 2014-02-26 NOTE — H&P (Signed)
Reason for Consult: NSTEMI  Referring Physician: [facility] ED  PCP: None Primary Cardiologist: Reola Calkins Aundra Dubin)  HPI: The patient is a 63 y/o [place] male, who presents to the [facility] ER with a complaint of left sided chest pain. Unfortunately, he is not fluent in [place]. As a result, the majority of his history is interpreted by family members. 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 He is followed by Dr. Charleston Ropes. He had an abdominal aortic duplex evaluation on 12/06/13 with the following dimensions: proximal 2.2 x 2.1, mid 1.9 x 2.1 and distal 1.7 x 1.6. His home meds include lisinopril and Metformin. He is not currently followed by a PCP and ran out of his lisinopril and metformin 2 weeks ago. He also has a 40+ year h/o ongoing tobacco abuse. His stroke in 2011 left him with residual left sided weakness.    He presented to the [facility] ED today with a complaint of intermittent left sided chest and left upper extremity "heaviness". He states that he has had intermittent symptoms for months now but developed severe symptoms yesterday. His complaints sound a bit atypical. His arm pain is exacerbated for forward flexion movements. His pain is also relieved with walking and worsened after prolonged rest. He states that he was performing yard work yesterday and lifted a heavy log w/o any assistance. His pain has been slightly improved at home with Tylenol. His chest pain resolved completely in the ER after morphine, but he continues to have left arm pain. He denies associated dyspnea. He did however have nausea w/o vomiting, as well as diaphoresis earlier.    On arrival to the ER, he was found to be bradycardic in the 40s and required atropine x 1. His HR has since improved into the 60s-70s. EKG demonstrates some T wave abnormalities. POC troponin was elevated at 0.12. Actual lab troponin returned elevated at 6.80. CT  of head was negative for acute abnormalities. CT of chest was negative for dissection.    Past Medical History   Diagnosis  Date   .  Hypertension    .  Diabetes mellitus    .  Stroke  09/26/2009     Syncope. [place] admission.   Marland Kitchen  AAA (abdominal aortic aneurysm)    .  Dissection of mesenteric artery     Past Surgical History   Procedure  Laterality  Date   .  Admission   09/26/2009     CVA. [name].    Family History   Problem  Relation  Age of Onset   .  Diabetes  Father    .  Hypertension  Father    .  Heart attack  Father     Social History: reports that he quit smoking about 4 years ago. He has never used smokeless tobacco. He reports that he does not drink alcohol or use illicit drugs.   Allergies: No Known Allergies   Medications:  Prior to Admission medications   Medication  Sig  Start Date  End Date  Taking?  Authorizing Provider   lisinopril (PRINIVIL,ZESTRIL) 20 MG tablet  Take 20 mg by mouth 2 (two) times daily.    Yes  Historical Provider, MD   metFORMIN (GLUCOPHAGE) 500 MG tablet  Take 1 tablet (500 mg total) by mouth 2 (two) times daily with a meal.  08/02/12  02/26/14  Yes  Wardell Honour, MD  Results for orders placed during the hospital encounter of 02/26/14 (from the past 48 hour(s))   CBC Status: None    Collection Time    02/26/14 12:45 PM   Result  Value  Ref Range    WBC  9.9  4.0 - 10.5 K/uL    RBC  5.16  4.22 - 5.81 MIL/uL    Hemoglobin  15.2  13.0 - 17.0 g/dL    HCT  45.0  39.0 - 52.0 %    MCV  87.2  78.0 - 100.0 fL    MCH  29.5  26.0 - 34.0 pg    MCHC  33.8  30.0 - 36.0 g/dL    RDW  13.0  11.5 - 15.5 %    Platelets  193  150 - 400 K/uL   BASIC METABOLIC PANEL Status: Abnormal    Collection Time    02/26/14 12:45 PM   Result  Value  Ref Range    Sodium  138  137 - 147 mEq/L    Potassium  3.7  3.7 - 5.3 mEq/L    Chloride  103  96 - 112 mEq/L    CO2  25  19 - 32 mEq/L    Glucose, Bld  193 (*)  70 - 99 mg/dL    BUN  14  6 - 23 mg/dL      Creatinine, Ser  1.04  0.50 - 1.35 mg/dL    Calcium  8.9  8.4 - 10.5 mg/dL    GFR calc non Af Amer  74 (*)  >90 mL/min    GFR calc Af Amer  86 (*)  >90 mL/min    Comment:  (NOTE)     The eGFR has been calculated using the CKD EPI equation.     This calculation has not been validated in all clinical situations.     eGFR's persistently <90 mL/min signify possible Chronic Kidney     Disease.   PRO B NATRIURETIC PEPTIDE Status: Abnormal    Collection Time    02/26/14 12:45 PM   Result  Value  Ref Range    Pro B Natriuretic peptide (BNP)  324.7 (*)  0 - 125 pg/mL   I-STAT TROPOININ, ED Status: Abnormal    Collection Time    02/26/14 1:00 PM   Result  Value  Ref Range    Troponin i, poc  0.12 (*)  0.00 - 0.08 ng/mL    Comment  NOTIFIED PHYSICIAN     Comment 3      Comment:  Due to the release kinetics of cTnI,     a negative result within the first hours     of the onset of symptoms does not rule out     myocardial infarction with certainty.     If myocardial infarction is still suspected,     repeat the test at appropriate intervals.   I-STAT CHEM 8, ED Status: Abnormal    Collection Time    02/26/14 1:02 PM   Result  Value  Ref Range    Sodium  143  137 - 147 mEq/L    Potassium  3.6 (*)  3.7 - 5.3 mEq/L    Chloride  100  96 - 112 mEq/L    BUN  15  6 - 23 mg/dL    Creatinine, Ser  1.20  0.50 - 1.35 mg/dL    Glucose, Bld  194 (*)  70 - 99 mg/dL    Calcium, Ion  1.14  1.13 - 1.30 mmol/L    TCO2  27  0 - 100 mmol/L    Hemoglobin  17.0  13.0 - 17.0 g/dL    HCT  50.0  39.0 - 52.0 %    Ct Head Wo Contrast  02/26/2014 CLINICAL DATA: Chest pain. Leg pain. EXAM: CT HEAD WITHOUT CONTRAST TECHNIQUE: Contiguous axial images were obtained from the base of the skull through the vertex without intravenous contrast. COMPARISON: CT head without contrast 09/30/2009 FINDINGS: No acute cortical infarct, hemorrhage, or mass lesion is present. The ventricles are of normal size. No significant  extra-axial fluid collection is evident. The paranasal sinuses and mastoid air cells are clear. The osseous skull is intact. IMPRESSION: Negative CT of the head. Electronically Signed By: Lawrence Santiago M.D. On: 02/26/2014 14:15   Dg Chest Portable 1 View  02/26/2014 CLINICAL DATA: Chest pain. EXAM: PORTABLE CHEST - 1 VIEW COMPARISON: None. FINDINGS: Heart size is exaggerated by low lung volumes. Pulmonary vascular congestion is evident. No focal airspace disease is present. IMPRESSION: 1. Low lung volumes and mild pulmonary vascular congestion. 2. No focal airspace disease. Electronically Signed By: Lawrence Santiago M.D. On: 02/26/2014 13:30   Ct Angio Chest Aortic Dissect W &/or W/o  02/26/2014 CLINICAL DATA: h/o dissection, mid and left-sided chest pain, hypertensive EXAM: CT ANGIOGRAPHY CHEST, ABDOMEN AND PELVIS TECHNIQUE: Multidetector CT imaging through the chest, abdomen and pelvis was performed using the standard protocol during bolus administration of intravenous contrast. Multiplanar reconstructed images including MIPs were obtained and reviewed to evaluate the vascular anatomy. CONTRAST: 161m OMNIPAQUE IOHEXOL 350 MG/ML SOLN COMPARISON: 12/03/2012 FINDINGS: CTA CHEST FINDINGS The noncontrast scout shows no hyperdense crescent, mediastinal hematoma, pleural or pericardial effusion. Patchy coronary calcifications and aortic arch calcifications are noted. CTA reveals no dissection or stenosis. Classic 3 vessel brachiocephalic arterial origin anatomy without proximal lesion. There is mild plaque in the distal arch and descending segment. . No significant intramural thrombus. Satisfactory opacification of pulmonary arteries noted, and there is no evidence of pulmonary emboli. Dependent atelectasis posteriorly in both lungs. No confluent airspace consolidation. Review of the MIP images confirms the above findings. Thoracic spine and sternum unremarkable. CTA ABDOMEN AND PELVIS FINDINGS Arterial findings: Aorta:  Mild scattered eccentric plaque. There is a focal ulceration just proximal to the bifurcation. No aneurysm, dissection, or stenosis. Celiac axis: Short segment high-grade stenosis just beyond the origin at the level of the median arcuate ligament of the diaphragm, patent distally. Superior mesenteric: Dissection in the proximal SMA, the false lumen thrombosed for length of 3.3 cm from its origin, perfused distally, extending over a length of approximately 4 cm, with some aneurysmal dilatation up to 13 mm diameter. No extension into branch vessels. Short segment extension into the middle colic branch as before. Stable appearance from prior study. Left renal: Single, patent. Right renal: Single, patent. Inferior mesenteric: Short segment origin stenosis related to aortic wall plaque, patent distally. Left iliac: Ectatic, 17 mm diameter, with some eccentric plaque in the common iliac. There is mild narrowing in the proximal internal iliac artery, patent distally. The external iliac is mildly tortuous, otherwise unremarkable. Right iliac: Mild nonocclusive plaque through the common iliac and in the proximal internal iliac. External iliac widely patent, mildly tortuous. Venous findings: Dedicated venous phase imaging not obtained. Review of the MIP images confirms the above findings. Nonvascular findings: Unremarkable arterial phase evaluation of liver, spleen, adrenal glands, pancreas. Gallbladder is incompletely distended. Tiny bilateral renal cysts. Stomach distended by ingested material. Small  bowel and colon are nondilated. Appendix not identified. No ascites. No free air. No adenopathy. Mild prostatic enlargement. Regional bones unremarkable. IMPRESSION: 1. No aortic dissection or other acute abnormality. 2. 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 3. Stable 17 mm ectasia of the left common iliac artery. 4. Atherosclerosis, including aortoiliac and coronary artery disease. Please note that although the  presence of coronary artery calcium documents the presence of coronary artery disease, the severity of this disease and any potential stenosis cannot be assessed on this non-gated CT examination. Assessment for potential risk factor modification, dietary therapy or pharmacologic therapy may be warranted, if clinically indicated. Electronically Signed By: Arne Cleveland M.D. On: 02/26/2014 14:37   Ct Angio Abd/pel W/ And/or W/o  02/26/2014 CLINICAL DATA: h/o dissection, mid and left-sided chest pain, hypertensive EXAM: CT ANGIOGRAPHY CHEST, ABDOMEN AND PELVIS TECHNIQUE: Multidetector CT imaging through the chest, abdomen and pelvis was performed using the standard protocol during bolus administration of intravenous contrast. Multiplanar reconstructed images including MIPs were obtained and reviewed to evaluate the vascular anatomy. CONTRAST: 144m OMNIPAQUE IOHEXOL 350 MG/ML SOLN COMPARISON: 12/03/2012 FINDINGS: CTA CHEST FINDINGS The noncontrast scout shows no hyperdense crescent, mediastinal hematoma, pleural or pericardial effusion. Patchy coronary calcifications and aortic arch calcifications are noted. CTA reveals no dissection or stenosis. Classic 3 vessel brachiocephalic arterial origin anatomy without proximal lesion. There is mild plaque in the distal arch and descending segment. . No significant intramural thrombus. Satisfactory opacification of pulmonary arteries noted, and there is no evidence of pulmonary emboli. Dependent atelectasis posteriorly in both lungs. No confluent airspace consolidation. Review of the MIP images confirms the above findings. Thoracic spine and sternum unremarkable. CTA ABDOMEN AND PELVIS FINDINGS Arterial findings: Aorta: Mild scattered eccentric plaque. There is a focal ulceration just proximal to the bifurcation. No aneurysm, dissection, or stenosis. Celiac axis: Short segment high-grade stenosis just beyond the origin at the level of the median arcuate ligament of the  diaphragm, patent distally. Superior mesenteric: Dissection in the proximal SMA, the false lumen thrombosed for length of 3.3 cm from its origin, perfused distally, extending over a length of approximately 4 cm, with some aneurysmal dilatation up to 13 mm diameter. No extension into branch vessels. Short segment extension into the middle colic branch as before. Stable appearance from prior study. Left renal: Single, patent. Right renal: Single, patent. Inferior mesenteric: Short segment origin stenosis related to aortic wall plaque, patent distally. Left iliac: Ectatic, 17 mm diameter, with some eccentric plaque in the common iliac. There is mild narrowing in the proximal internal iliac artery, patent distally. The external iliac is mildly tortuous, otherwise unremarkable. Right iliac: Mild nonocclusive plaque through the common iliac and in the proximal internal iliac. External iliac widely patent, mildly tortuous. Venous findings: Dedicated venous phase imaging not obtained. Review of the MIP images confirms the above findings. Nonvascular findings: Unremarkable arterial phase evaluation of liver, spleen, adrenal glands, pancreas. Gallbladder is incompletely distended. Tiny bilateral renal cysts. Stomach distended by ingested material. Small bowel and colon are nondilated. Appendix not identified. No ascites. No free air. No adenopathy. Mild prostatic enlargement. Regional bones unremarkable. IMPRESSION: 1. No aortic dissection or other acute abnormality. 2. 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 3. Stable 17 mm ectasia of the left common iliac artery. 4. Atherosclerosis, including aortoiliac and coronary artery disease. Please note that although the presence of coronary artery calcium documents the presence of coronary artery disease, the severity of this disease and any potential stenosis cannot be assessed  on this non-gated CT examination. Assessment for potential risk factor modification, dietary  therapy or pharmacologic therapy may be warranted, if clinically indicated. Electronically Signed By: Arne Cleveland M.D. On: 02/26/2014 14:37   Review of Systems  Eyes: Positive for blurred vision.  Respiratory: Negative for shortness of breath.  Cardiovascular: Positive for chest pain.  Gastrointestinal: Positive for nausea. Negative for vomiting.  Musculoskeletal: Positive for joint pain.  Neurological: Negative for dizziness and loss of consciousness.  All other systems reviewed and are negative.   Blood pressure 165/119, pulse 75, temperature 97.6 F (36.4 C), resp. rate 12, SpO2 98.00%.  Physical Exam  Constitutional: He is oriented to person, place, and time. He appears well-developed and well-nourished. No distress.  Neck: No JVD present.  Cardiovascular: Normal rate, regular rhythm, normal heart sounds and intact distal pulses. Exam reveals no gallop and no friction rub.  No murmur heard.  Respiratory: Effort normal and breath sounds normal. No respiratory distress. He has no wheezes. He has no rales. He exhibits tenderness (left sided).  GI: Soft. Bowel sounds are normal. He exhibits no distension and no mass. There is no tenderness.  Musculoskeletal: He exhibits no edema.  Left shoulder: He exhibits tenderness (anterior aspect of left shoulder ).  Neurological: He is alert and oriented to person, place, and time.  Skin: Skin is warm and dry. He is not diaphoretic.  Psychiatric: He has a normal mood and affect. His behavior is normal.   Assessment/Plan:  Active Problems:  TIA (transient ischemic attack)  NSTEMI (non-ST elevated myocardial infarction)   The patient was also seen and examined by Dr. Aundra Dubin. Due to ongoing left arm pain, multiple cardiac risk factors and an elevated troponin of 6.8, will seen to the cath lab for diagnostic LHC +/- PCI. He has been given ASA. Will start IV Heparin and IV NTG. Will also order high dose statin. No BB given bradycardia. Will check  Hgb A1c and fasting lipid panel in the am.  Lyda Jester, PA-C 02/26/2014, 3:38 PM    Patient seen with PA, agree with the above note. Daughter interprets, patient speaks no [place]. History is very difficult, he has a number of symptoms. Patient has history of vascular disease (left CIA penetrating ulcer and chronic SMA dissection). He has apparently been having left-sided chest pain maybe for a month on and off, but it became much worse today when he was outside doing some yardwork. He also had tingling in his left arm. He had a number of other complaints, including numbness on his torso and back pain. When he arrived in the ER, he had CTA that ruled out dissection and PE. CT head showed no CVA. TnI POC was 0.12, repeat troponin 6.8. Initially in the ER, he was bradycardic in the 40s with anterior nonspecific T wave inversions. Most recent ECG showed HR 76 with no significant ECG changes. Chest pain has been improved with treatment in the ER but he still has left arm pain.   1. CAD: NSTEMI. Initial bradycardia raises concern for RCA lesion. Will start heparin gtt and give ASA 325 and atorvastatin 80. Will plan for LHC today without LV-gram. Will get echo. No beta blocker with bradycardia.  2. CKD: Creatinine 1.2. Patient had 100 cc contrast with CTA chest. Given ongoing symptoms and elevated TnI, think it best to proceed with cardiac catheterization today. Will hydrate with IV fluid and try to minimize contrast at cath (no LV-gram).   3. PAD: known diagnosis, stable.  4. HTN: Control with NTG gtt.    Larey Dresser  02/26/2014  4:43 PM

## 2014-02-26 NOTE — Progress Notes (Signed)
4N called ED to get report on this pt. Could not get report at this time because pt is going to cath lab.

## 2014-02-26 NOTE — H&P (Signed)
See my above note

## 2014-02-26 NOTE — ED Notes (Signed)
PT DID NOT HAVE SWALLOW SCREEN. CATH LAB CALLING READY FOR PATIENT. PT IS NSTEMI. [facility] COMPLETED.

## 2014-02-26 NOTE — Consult Note (Addendum)
[facility] Medical Consultation  Kazuo Lambeth [id number] DOB: 1951/08/23 DOA: 02/26/2014 PCP: No primary provider on file.   Requesting physician: Laren Everts, Einar Crow Date of consultation: 02/26/2014 Reason for consultation:  Left arm weakness  Impression/Recommendations Active Problems:   TIA (transient ischemic attack)   NSTEMI (non-ST elevated myocardial infarction)  NSTEMI, initial troponin 6.8  - ECG demonstrates no ischemic changes - Start statin  - Cautious use of BB given recent bradycardia  - Start low dose ACEI if BP tolerates  - A1c, lipid panel  - Cycle troponins  - Aspirin  - Heparin gtt  - NTG per cardiology  - Appreciate cardiology assistance >> to cath lab immediately from ER   Vascular congestion on CXR suggestive of heart failure  - F/u ECHO  - Judicious use of IVF   TIA with left arm and left weakness worse than prior.  May also have been due to MI. - Telemetry  - MRI/MRA  - Carotid duplex  - ECHO  -  PT/OT/SLP consults  PVD with chronic dissection of SMA -  Start statin -  Start antiplatelet agent per cardiology  DM  - A1c  - SSI  - Hold metformin  HTN  - Retart ACEI  I will followup again tomorrow. Please contact me if I can be of assistance in the meanwhile. Thank you for this consultation.  Chief Complaint: chest pain, increased left sided weakness  HPI:   The patient is a 63 y.o. 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  No primary care doctor currently.  He has chronic chest pain of the left chest but he recently developed worsening of the left chest pain to 9/10 with radiation down the left arm and into the left abdomen and left leg.  Denies associated SOB, V, D, but + nausea.  Movement of the left arm  worsens his pain.  Pain was associated with some decreased sensation or numbness of the left arm and leg.  Denies LOC.    In ER, initially presented with mild bradycardia to the mid-30s with hypotension 90s over 50s. This resolved spontaneously without atropine. Most recent blood pressure 155/119. CBC and BMP wnl except for elevated blood glucose. Initial i-STAT troponin 0.12, followup serum troponin 6.8.  ECG without acute ST segment changes.  ProBNP 324. Chest x-ray demonstrated mild pulmonary vascular congestion without acute disease. CT scan showed chest abdomen and pelvis demonstrated no acute dissection, stable dissection of the superior mesenteric artery, stable 17 mm ectasia of the left common iliac artery, atherosclerosis diffusely with a Melisha Eggleton segment of stenosis of the inferior mesenteric artery.  Cardiology planning to take to cath lab.    Review of Systems:   General: Denies fevers, chills, weight loss or gain  HEENT: Denies changes to hearing and vision, rhinorrhea, sinus congestion, sore throat  CV: per HPI.  PULM: Denies wheezing, cough.  GI: + nausea, denies vomiting, constipation, diarrhea.  GU: Denies dysuria, frequency, urgency  ENDO: Denies polyuria, polydipsia.  HEME: Denies hematemesis, blood in stools, melena, abnormal bruising or bleeding.  LYMPH: Denies lymphadenopathy.  MSK: Denies arthralgias, myalgias.  DERM: Denies skin rash or ulcer.  NEURO: per HPI PSYCH: Denies anxiety and depression.    Past Medical History  Diagnosis Date  . Hypertension   .  Diabetes mellitus   . Stroke 09/26/2009    Syncope.  [facility]  admission.  Marland Kitchen AAA (abdominal aortic aneurysm)   . Dissection of mesenteric artery    Past Surgical History  Procedure Laterality Date  . Admission  09/26/2009    CVA.  [name].   Social History:  reports that he quit smoking about 4 years ago. He has never used smokeless tobacco. He reports that he does not drink alcohol or use illicit  drugs.   No Known Allergies Family History  Problem Relation Age of Onset  . Diabetes Father   . Hypertension Father   . Heart attack Father     Prior to Admission medications   Medication Sig Start Date End Date Taking? Authorizing Provider  lisinopril (PRINIVIL,ZESTRIL) 20 MG tablet Take 20 mg by mouth 2 (two) times daily.   Yes Historical Provider, MD  metFORMIN (GLUCOPHAGE) 500 MG tablet Take 1 tablet (500 mg total) by mouth 2 (two) times daily with a meal. 08/02/12 02/26/14 Yes Wardell Honour, MD   Physical Exam: Blood pressure 170/104, pulse 77, temperature 97.6 F (36.4 C), resp. rate 16, height 5\' 7"  (1.702 m), weight 72.576 kg (160 lb), SpO2 97.00%. Filed Vitals:   02/26/14 1515 02/26/14 1530 02/26/14 1636 02/26/14 1640  BP: 165/119   170/104  Pulse: 75   77  Temp:      Resp: 12   16  Height:  5' 8.9" (1.75 m) 5\' 7"  (1.702 m)   Weight:  80.3 kg (177 lb 0.5 oz) 72.576 kg (160 lb)   SpO2: 98%   97%     General:  Adult male, NAD  Eyes:  PERRL, anicteric, non-injected.  ENT:  Nares clear.  OP clear, non-erythematous without plaques or exudates.  MMM.  Neck:  Supple without TM or JVD.    Lymph:  No cervical, supraclavicular, or submandibular LAD.  Cardiovascular:  RRR, normal S1, S2, without m/r/g.  2+ pulses, warm extremities  Respiratory:  CTA bilaterally without increased WOB.  Abdomen:  NABS.  Soft, ND, mild TTP in the RUQ and LUQ without rebound or guarding    Skin:  Linear bruising on upper chest in symmetric cranial caudal axis, parallel to each other.  Multiple tattoos.    Musculoskeletal:  Normal bulk and tone.  No LE edema.  Psychiatric:  A & O x 4.  Appropriate affect.  Neurologic:  CN 3-12 intact.  5/5 strength RUE and RLE.  4-/5 LUE strength and 4/5 LLE strength.  Labs on Admission:  Basic Metabolic Panel:  Recent Labs Lab 02/26/14 1245 02/26/14 1302  NA 138 143  K 3.7 3.6*  CL 103 100  CO2 25  --   GLUCOSE 193* 194*  BUN 14 15   CREATININE 1.04 1.20  CALCIUM 8.9  --    Liver Function Tests: No results found for this basename: AST, ALT, ALKPHOS, BILITOT, PROT, ALBUMIN,  in the last 168 hours No results found for this basename: LIPASE, AMYLASE,  in the last 168 hours No results found for this basename: AMMONIA,  in the last 168 hours CBC:  Recent Labs Lab 02/26/14 1245 02/26/14 1302  WBC 9.9  --   HGB 15.2 17.0  HCT 45.0 50.0  MCV 87.2  --   PLT 193  --    Cardiac Enzymes:  Recent Labs Lab 02/26/14 1526  TROPONINI 6.80*   BNP: No components found with this basename: POCBNP,  CBG: No results found for this  basename: GLUCAP,  in the last 168 hours  Radiological Exams on Admission: Ct Head Wo Contrast  02/26/2014   CLINICAL DATA:  Chest pain.  Leg pain.  EXAM: CT HEAD WITHOUT CONTRAST  TECHNIQUE: Contiguous axial images were obtained from the base of the skull through the vertex without intravenous contrast.  COMPARISON:  CT head without contrast 09/30/2009  FINDINGS: No acute cortical infarct, hemorrhage, or mass lesion is present. The ventricles are of normal size. No significant extra-axial fluid collection is evident. The paranasal sinuses and mastoid air cells are clear. The osseous skull is intact.  IMPRESSION: Negative CT of the head.   Electronically Signed   By: Lawrence Santiago M.D.   On: 02/26/2014 14:15   Dg Chest Portable 1 View  02/26/2014   CLINICAL DATA:  Chest pain.  EXAM: PORTABLE CHEST - 1 VIEW  COMPARISON:  None.  FINDINGS: Heart size is exaggerated by low lung volumes. Pulmonary vascular congestion is evident. No focal airspace disease is present.  IMPRESSION: 1. Low lung volumes and mild pulmonary vascular congestion. 2. No focal airspace disease.   Electronically Signed   By: Lawrence Santiago M.D.   On: 02/26/2014 13:30   Ct Angio Chest Aortic Dissect W &/or W/o  02/26/2014   CLINICAL DATA:  h/o dissection, mid and left-sided chest pain, hypertensive  EXAM: CT ANGIOGRAPHY CHEST, ABDOMEN  AND PELVIS  TECHNIQUE: Multidetector CT imaging through the chest, abdomen and pelvis was performed using the standard protocol during bolus administration of intravenous contrast. Multiplanar reconstructed images including MIPs were obtained and reviewed to evaluate the vascular anatomy.  CONTRAST:  124mL OMNIPAQUE IOHEXOL 350 MG/ML SOLN  COMPARISON:  12/03/2012  FINDINGS: CTA CHEST FINDINGS  The noncontrast scout shows no hyperdense crescent, mediastinal hematoma, pleural or pericardial effusion. Patchy coronary calcifications and aortic arch calcifications are noted.  CTA reveals no dissection or stenosis. Classic 3 vessel brachiocephalic arterial origin anatomy without proximal lesion. There is mild plaque in the distal arch and descending segment. . No significant intramural thrombus. Satisfactory opacification of pulmonary arteries noted, and there is no evidence of pulmonary emboli.  Dependent atelectasis posteriorly in both lungs. No confluent airspace consolidation.  Review of the MIP images confirms the above findings. Thoracic spine and sternum unremarkable.  CTA ABDOMEN AND PELVIS FINDINGS  Arterial findings:  Aorta: Mild scattered eccentric plaque. There is a focal ulceration just proximal to the bifurcation. No aneurysm, dissection, or stenosis.  Celiac axis: Sissy Goetzke segment high-grade stenosis just beyond the origin at the level of the median arcuate ligament of the diaphragm, patent distally.  Superior mesenteric: Dissection in the proximal SMA, the false lumen thrombosed for length of 3.3 cm from its origin, perfused distally, extending over a length of approximately 4 cm, with some aneurysmal dilatation up to 13 mm diameter. No extension into branch vessels. Brice Potteiger segment extension into the middle colic branch as before. Stable appearance from prior study.  Left renal:          Single, patent.  Right renal:         Single, patent.  Inferior mesenteric: Maaz Spiering segment origin stenosis related to aortic  wall plaque, patent distally.  Left iliac: Ectatic, 17 mm diameter, with some eccentric plaque in the common iliac. There is mild narrowing in the proximal internal iliac artery, patent distally. The external iliac is mildly tortuous, otherwise unremarkable.  Right iliac: Mild nonocclusive plaque through the common iliac and in the proximal internal iliac. External iliac widely patent, mildly  tortuous.  Venous findings:     Dedicated venous phase imaging not obtained.  Review of the MIP images confirms the above findings.  Nonvascular findings: Unremarkable arterial phase evaluation of liver, spleen, adrenal glands, pancreas. Gallbladder is incompletely distended. Tiny bilateral renal cysts. Stomach distended by ingested material. Small bowel and colon are nondilated. Appendix not identified. No ascites. No free air. No adenopathy. Mild prostatic enlargement. Regional bones unremarkable.  IMPRESSION: 1. No aortic  dissection or other acute abnormality. 2. 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 3. Stable 17 mm ectasia of the left common iliac artery. 4. Atherosclerosis, including aortoiliac and coronary artery disease. Please note that although the presence of coronary artery calcium documents the presence of coronary artery disease, the severity of this disease and any potential stenosis cannot be assessed on this non-gated CT examination. Assessment for potential risk factor modification, dietary therapy or pharmacologic therapy may be warranted, if clinically indicated.   Electronically Signed   By: Arne Cleveland M.D.   On: 02/26/2014 14:37   Ct Angio Abd/pel W/ And/or W/o  02/26/2014   CLINICAL DATA:  h/o dissection, mid and left-sided chest pain, hypertensive  EXAM: CT ANGIOGRAPHY CHEST, ABDOMEN AND PELVIS  TECHNIQUE: Multidetector CT imaging through the chest, abdomen and pelvis was performed using the standard protocol during bolus administration of intravenous contrast. Multiplanar  reconstructed images including MIPs were obtained and reviewed to evaluate the vascular anatomy.  CONTRAST:  170mL OMNIPAQUE IOHEXOL 350 MG/ML SOLN  COMPARISON:  12/03/2012  FINDINGS: CTA CHEST FINDINGS  The noncontrast scout shows no hyperdense crescent, mediastinal hematoma, pleural or pericardial effusion. Patchy coronary calcifications and aortic arch calcifications are noted.  CTA reveals no dissection or stenosis. Classic 3 vessel brachiocephalic arterial origin anatomy without proximal lesion. There is mild plaque in the distal arch and descending segment. . No significant intramural thrombus. Satisfactory opacification of pulmonary arteries noted, and there is no evidence of pulmonary emboli.  Dependent atelectasis posteriorly in both lungs. No confluent airspace consolidation.  Review of the MIP images confirms the above findings. Thoracic spine and sternum unremarkable.  CTA ABDOMEN AND PELVIS FINDINGS  Arterial findings:  Aorta: Mild scattered eccentric plaque. There is a focal ulceration just proximal to the bifurcation. No aneurysm, dissection, or stenosis.  Celiac axis: Beckett Hickmon segment high-grade stenosis just beyond the origin at the level of the median arcuate ligament of the diaphragm, patent distally.  Superior mesenteric: Dissection in the proximal SMA, the false lumen thrombosed for length of 3.3 cm from its origin, perfused distally, extending over a length of approximately 4 cm, with some aneurysmal dilatation up to 13 mm diameter. No extension into branch vessels. Zhuri Krass segment extension into the middle colic branch as before. Stable appearance from prior study.  Left renal:          Single, patent.  Right renal:         Single, patent.  Inferior mesenteric: Pleasant Britz segment origin stenosis related to aortic wall plaque, patent distally.  Left iliac: Ectatic, 17 mm diameter, with some eccentric plaque in the common iliac. There is mild narrowing in the proximal internal iliac artery, patent  distally. The external iliac is mildly tortuous, otherwise unremarkable.  Right iliac: Mild nonocclusive plaque through the common iliac and in the proximal internal iliac. External iliac widely patent, mildly tortuous.  Venous findings:     Dedicated venous phase imaging not obtained.  Review of the MIP images confirms the above findings.  Nonvascular findings: Unremarkable arterial  phase evaluation of liver, spleen, adrenal glands, pancreas. Gallbladder is incompletely distended. Tiny bilateral renal cysts. Stomach distended by ingested material. Small bowel and colon are nondilated. Appendix not identified. No ascites. No free air. No adenopathy. Mild prostatic enlargement. Regional bones unremarkable.  IMPRESSION: 1. No aortic  dissection or other acute abnormality. 2. 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 3. Stable 17 mm ectasia of the left common iliac artery. 4. Atherosclerosis, including aortoiliac and coronary artery disease. Please note that although the presence of coronary artery calcium documents the presence of coronary artery disease, the severity of this disease and any potential stenosis cannot be assessed on this non-gated CT examination. Assessment for potential risk factor modification, dietary therapy or pharmacologic therapy may be warranted, if clinically indicated.   Electronically Signed   By: Arne Cleveland M.D.   On: 02/26/2014 14:37    EKG: Independently reviewed. NSR, flattened T-waves in V4-V6, but no ST segment depressions or elevations   Time spent: 75 min  Janece Canterbury [facility] Pager [phone]  If 7PM-7AM, please contact night-coverage [email or web address] Password [facility] 02/26/2014, 5:36 PM

## 2014-02-26 NOTE — ED Notes (Signed)
PT WAS GIVEN 324 ASPIRIN BY EMS

## 2014-02-26 NOTE — Consult Note (Deleted)
Reason for Consult: Elevated POC Troponin Referring Physician: [facility] ED   HPI: The patient is a 63 y/o [place] male, who presents to the [facility] ER with a complaint of left sided chest pain. Unfortunately, he is not fluent in [place]. As a result, the majority of his history is interpreted by family members. 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 He is followed by Dr. Charleston Ropes. He had an abdominal aortic duplex evaluation on 12/06/13 with the following dimensions: proximal 2.2 x 2.1, mid 1.9 x 2.1 and distal 1.7 x 1.6. His home meds include lisinopril and Metformin. He is not currently followed by a PCP and ran out of his lisinopril and metformin 2 weeks ago. He also has a 40+ year h/o ongoing tobacco abuse. His stroke in 2011 left him with residual left sided weakness.   He presented to the [facility] ED today with a complaint of intermittent left sided chest and left upper extremity "heaviness". He states that he has had intermittent symptoms for months now but developed severe symptoms yesterday. His complaints sound a bit atypical. His arm pain is exacerbated for forward flexion movements. His pain is also relieved with walking and worsened after prolonged rest. He states that he was performing yard work yesterday and lifted a heavy log w/o any assistance.  His pain has been slightly improved at home with Tylenol. His chest pain resolved completely in the ER after morphine, but he continues to have left arm pain. He denies associated dyspnea. He did however have nausea w/o vomiting, as well as diaphoresis earlier.   On arrival to the ER, he was found to be bradycardic in the 40s and required atropine x 1. His HR has since improved into the 60s-70s. EKG demonstrates some T wave abnormalities. POC troponin was elevated at 0.12. Actual lab troponin returned elevated at 6.80. CT of head was negative for acute  abnormalities. CT of chest was negative for dissection.   Past Medical History  Diagnosis Date  . Hypertension   . Diabetes mellitus   . Stroke 09/26/2009    Syncope.  [place]  admission.  Marland Kitchen AAA (abdominal aortic aneurysm)   . Dissection of mesenteric artery     Past Surgical History  Procedure Laterality Date  . Admission  09/26/2009    CVA.  [name].    Family History  Problem Relation Age of Onset  . Diabetes Father   . Hypertension Father   . Heart attack Father     Social History:  reports that he quit smoking about 4 years ago. He has never used smokeless tobacco. He reports that he does not drink alcohol or use illicit drugs.  Allergies: No Known Allergies  Medications: Prior to Admission medications   Medication Sig Start Date End Date Taking? Authorizing Provider  lisinopril (PRINIVIL,ZESTRIL) 20 MG tablet Take 20 mg by mouth 2 (two) times daily.   Yes Historical Provider, MD  metFORMIN (GLUCOPHAGE) 500 MG tablet Take 1 tablet (500 mg total) by mouth 2 (two) times daily with a meal. 08/02/12 02/26/14 Yes Wardell Honour, MD     Results for orders placed during the hospital encounter of 02/26/14 (from the past 48 hour(s))  CBC     Status: None   Collection Time    02/26/14 12:45 PM      Result Value Ref Range   WBC 9.9  4.0 - 10.5 K/uL  RBC 5.16  4.22 - 5.81 MIL/uL   Hemoglobin 15.2  13.0 - 17.0 g/dL   HCT 45.0  39.0 - 52.0 %   MCV 87.2  78.0 - 100.0 fL   MCH 29.5  26.0 - 34.0 pg   MCHC 33.8  30.0 - 36.0 g/dL   RDW 13.0  11.5 - 15.5 %   Platelets 193  150 - 400 K/uL  BASIC METABOLIC PANEL     Status: Abnormal   Collection Time    02/26/14 12:45 PM      Result Value Ref Range   Sodium 138  137 - 147 mEq/L   Potassium 3.7  3.7 - 5.3 mEq/L   Chloride 103  96 - 112 mEq/L   CO2 25  19 - 32 mEq/L   Glucose, Bld 193 (*) 70 - 99 mg/dL   BUN 14  6 - 23 mg/dL   Creatinine, Ser 1.04  0.50 - 1.35 mg/dL   Calcium 8.9  8.4 - 10.5 mg/dL   GFR calc non Af Amer  74 (*) >90 mL/min   GFR calc Af Amer 86 (*) >90 mL/min   Comment: (NOTE)     The eGFR has been calculated using the CKD EPI equation.     This calculation has not been validated in all clinical situations.     eGFR's persistently <90 mL/min signify possible Chronic Kidney     Disease.  PRO B NATRIURETIC PEPTIDE     Status: Abnormal   Collection Time    02/26/14 12:45 PM      Result Value Ref Range   Pro B Natriuretic peptide (BNP) 324.7 (*) 0 - 125 pg/mL  I-STAT TROPOININ, ED     Status: Abnormal   Collection Time    02/26/14  1:00 PM      Result Value Ref Range   Troponin i, poc 0.12 (*) 0.00 - 0.08 ng/mL   Comment NOTIFIED PHYSICIAN     Comment 3            Comment: Due to the release kinetics of cTnI,     a negative result within the first hours     of the onset of symptoms does not rule out     myocardial infarction with certainty.     If myocardial infarction is still suspected,     repeat the test at appropriate intervals.  I-STAT CHEM 8, ED     Status: Abnormal   Collection Time    02/26/14  1:02 PM      Result Value Ref Range   Sodium 143  137 - 147 mEq/L   Potassium 3.6 (*) 3.7 - 5.3 mEq/L   Chloride 100  96 - 112 mEq/L   BUN 15  6 - 23 mg/dL   Creatinine, Ser 1.20  0.50 - 1.35 mg/dL   Glucose, Bld 194 (*) 70 - 99 mg/dL   Calcium, Ion 1.14  1.13 - 1.30 mmol/L   TCO2 27  0 - 100 mmol/L   Hemoglobin 17.0  13.0 - 17.0 g/dL   HCT 50.0  39.0 - 52.0 %    Ct Head Wo Contrast  02/26/2014   CLINICAL DATA:  Chest pain.  Leg pain.  EXAM: CT HEAD WITHOUT CONTRAST  TECHNIQUE: Contiguous axial images were obtained from the base of the skull through the vertex without intravenous contrast.  COMPARISON:  CT head without contrast 09/30/2009  FINDINGS: No acute cortical infarct, hemorrhage, or mass lesion is present. The ventricles  are of normal size. No significant extra-axial fluid collection is evident. The paranasal sinuses and mastoid air cells are clear. The osseous skull is  intact.  IMPRESSION: Negative CT of the head.   Electronically Signed   By: Lawrence Santiago M.D.   On: 02/26/2014 14:15   Dg Chest Portable 1 View  02/26/2014   CLINICAL DATA:  Chest pain.  EXAM: PORTABLE CHEST - 1 VIEW  COMPARISON:  None.  FINDINGS: Heart size is exaggerated by low lung volumes. Pulmonary vascular congestion is evident. No focal airspace disease is present.  IMPRESSION: 1. Low lung volumes and mild pulmonary vascular congestion. 2. No focal airspace disease.   Electronically Signed   By: Lawrence Santiago M.D.   On: 02/26/2014 13:30   Ct Angio Chest Aortic Dissect W &/or W/o  02/26/2014   CLINICAL DATA:  h/o dissection, mid and left-sided chest pain, hypertensive  EXAM: CT ANGIOGRAPHY CHEST, ABDOMEN AND PELVIS  TECHNIQUE: Multidetector CT imaging through the chest, abdomen and pelvis was performed using the standard protocol during bolus administration of intravenous contrast. Multiplanar reconstructed images including MIPs were obtained and reviewed to evaluate the vascular anatomy.  CONTRAST:  120m OMNIPAQUE IOHEXOL 350 MG/ML SOLN  COMPARISON:  12/03/2012  FINDINGS: CTA CHEST FINDINGS  The noncontrast scout shows no hyperdense crescent, mediastinal hematoma, pleural or pericardial effusion. Patchy coronary calcifications and aortic arch calcifications are noted.  CTA reveals no dissection or stenosis. Classic 3 vessel brachiocephalic arterial origin anatomy without proximal lesion. There is mild plaque in the distal arch and descending segment. . No significant intramural thrombus. Satisfactory opacification of pulmonary arteries noted, and there is no evidence of pulmonary emboli.  Dependent atelectasis posteriorly in both lungs. No confluent airspace consolidation.  Review of the MIP images confirms the above findings. Thoracic spine and sternum unremarkable.  CTA ABDOMEN AND PELVIS FINDINGS  Arterial findings:  Aorta: Mild scattered eccentric plaque. There is a focal ulceration just proximal  to the bifurcation. No aneurysm, dissection, or stenosis.  Celiac axis: Short segment high-grade stenosis just beyond the origin at the level of the median arcuate ligament of the diaphragm, patent distally.  Superior mesenteric: Dissection in the proximal SMA, the false lumen thrombosed for length of 3.3 cm from its origin, perfused distally, extending over a length of approximately 4 cm, with some aneurysmal dilatation up to 13 mm diameter. No extension into branch vessels. Short segment extension into the middle colic branch as before. Stable appearance from prior study.  Left renal:          Single, patent.  Right renal:         Single, patent.  Inferior mesenteric: Short segment origin stenosis related to aortic wall plaque, patent distally.  Left iliac: Ectatic, 17 mm diameter, with some eccentric plaque in the common iliac. There is mild narrowing in the proximal internal iliac artery, patent distally. The external iliac is mildly tortuous, otherwise unremarkable.  Right iliac: Mild nonocclusive plaque through the common iliac and in the proximal internal iliac. External iliac widely patent, mildly tortuous.  Venous findings:     Dedicated venous phase imaging not obtained.  Review of the MIP images confirms the above findings.  Nonvascular findings: Unremarkable arterial phase evaluation of liver, spleen, adrenal glands, pancreas. Gallbladder is incompletely distended. Tiny bilateral renal cysts. Stomach distended by ingested material. Small bowel and colon are nondilated. Appendix not identified. No ascites. No free air. No adenopathy. Mild prostatic enlargement. Regional bones unremarkable.  IMPRESSION: 1. No  aortic  dissection or other acute abnormality. 2. 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 3. Stable 17 mm ectasia of the left common iliac artery. 4. Atherosclerosis, including aortoiliac and coronary artery disease. Please note that although the presence of coronary artery calcium  documents the presence of coronary artery disease, the severity of this disease and any potential stenosis cannot be assessed on this non-gated CT examination. Assessment for potential risk factor modification, dietary therapy or pharmacologic therapy may be warranted, if clinically indicated.   Electronically Signed   By: Arne Cleveland M.D.   On: 02/26/2014 14:37   Ct Angio Abd/pel W/ And/or W/o  02/26/2014   CLINICAL DATA:  h/o dissection, mid and left-sided chest pain, hypertensive  EXAM: CT ANGIOGRAPHY CHEST, ABDOMEN AND PELVIS  TECHNIQUE: Multidetector CT imaging through the chest, abdomen and pelvis was performed using the standard protocol during bolus administration of intravenous contrast. Multiplanar reconstructed images including MIPs were obtained and reviewed to evaluate the vascular anatomy.  CONTRAST:  134m OMNIPAQUE IOHEXOL 350 MG/ML SOLN  COMPARISON:  12/03/2012  FINDINGS: CTA CHEST FINDINGS  The noncontrast scout shows no hyperdense crescent, mediastinal hematoma, pleural or pericardial effusion. Patchy coronary calcifications and aortic arch calcifications are noted.  CTA reveals no dissection or stenosis. Classic 3 vessel brachiocephalic arterial origin anatomy without proximal lesion. There is mild plaque in the distal arch and descending segment. . No significant intramural thrombus. Satisfactory opacification of pulmonary arteries noted, and there is no evidence of pulmonary emboli.  Dependent atelectasis posteriorly in both lungs. No confluent airspace consolidation.  Review of the MIP images confirms the above findings. Thoracic spine and sternum unremarkable.  CTA ABDOMEN AND PELVIS FINDINGS  Arterial findings:  Aorta: Mild scattered eccentric plaque. There is a focal ulceration just proximal to the bifurcation. No aneurysm, dissection, or stenosis.  Celiac axis: Short segment high-grade stenosis just beyond the origin at the level of the median arcuate ligament of the diaphragm,  patent distally.  Superior mesenteric: Dissection in the proximal SMA, the false lumen thrombosed for length of 3.3 cm from its origin, perfused distally, extending over a length of approximately 4 cm, with some aneurysmal dilatation up to 13 mm diameter. No extension into branch vessels. Short segment extension into the middle colic branch as before. Stable appearance from prior study.  Left renal:          Single, patent.  Right renal:         Single, patent.  Inferior mesenteric: Short segment origin stenosis related to aortic wall plaque, patent distally.  Left iliac: Ectatic, 17 mm diameter, with some eccentric plaque in the common iliac. There is mild narrowing in the proximal internal iliac artery, patent distally. The external iliac is mildly tortuous, otherwise unremarkable.  Right iliac: Mild nonocclusive plaque through the common iliac and in the proximal internal iliac. External iliac widely patent, mildly tortuous.  Venous findings:     Dedicated venous phase imaging not obtained.  Review of the MIP images confirms the above findings.  Nonvascular findings: Unremarkable arterial phase evaluation of liver, spleen, adrenal glands, pancreas. Gallbladder is incompletely distended. Tiny bilateral renal cysts. Stomach distended by ingested material. Small bowel and colon are nondilated. Appendix not identified. No ascites. No free air. No adenopathy. Mild prostatic enlargement. Regional bones unremarkable.  IMPRESSION: 1. No aortic  dissection or other acute abnormality. 2. 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 3. Stable 17 mm ectasia of the left common iliac artery. 4. Atherosclerosis, including  aortoiliac and coronary artery disease. Please note that although the presence of coronary artery calcium documents the presence of coronary artery disease, the severity of this disease and any potential stenosis cannot be assessed on this non-gated CT examination. Assessment for potential risk factor  modification, dietary therapy or pharmacologic therapy may be warranted, if clinically indicated.   Electronically Signed   By: Arne Cleveland M.D.   On: 02/26/2014 14:37    Review of Systems  Eyes: Positive for blurred vision.  Respiratory: Negative for shortness of breath.   Cardiovascular: Positive for chest pain.  Gastrointestinal: Positive for nausea. Negative for vomiting.  Musculoskeletal: Positive for joint pain.  Neurological: Negative for dizziness and loss of consciousness.  All other systems reviewed and are negative.  Blood pressure 165/119, pulse 75, temperature 97.6 F (36.4 C), resp. rate 12, SpO2 98.00%. Physical Exam  Constitutional: He is oriented to person, place, and time. He appears well-developed and well-nourished. No distress.  Neck: No JVD present.  Cardiovascular: Normal rate, regular rhythm, normal heart sounds and intact distal pulses.  Exam reveals no gallop and no friction rub.   No murmur heard. Respiratory: Effort normal and breath sounds normal. No respiratory distress. He has no wheezes. He has no rales. He exhibits tenderness (left sided).  GI: Soft. Bowel sounds are normal. He exhibits no distension and no mass. There is no tenderness.  Musculoskeletal: He exhibits no edema.       Left shoulder: He exhibits tenderness (anterior aspect of left shoulder ).  Neurological: He is alert and oriented to person, place, and time.  Skin: Skin is warm and dry. He is not diaphoretic.  Psychiatric: He has a normal mood and affect. His behavior is normal.    Assessment/Plan: Active Problems:   TIA (transient ischemic attack)   NSTEMI (non-ST elevated myocardial infarction)  The patient was also seen and examined by Dr. Aundra Dubin. Due to ongoing left arm pain, multiple cardiac risk factors and an elevated troponin of 6.8, will seen to the cath lab for diagnostic LHC +/- PCI. He has been given ASA. Will start IV Heparin and IV NTG. Will also order high dose statin.  No BB given bradycardia.   Seabron Iannello 02/26/2014, 3:38 PM   Patient seen with PA, agree with the above note. Daughter interprets, patient speaks no [place]. History is very difficult, he has a number of symptoms. Patient has history of vascular disease (left CIA penetrating ulcer and chronic SMA dissection).  He has apparently been having left-sided chest pain maybe for a month on and off, but it became much worse today when he was outside doing some yardwork.  He also had tingling in his left arm.  He had a number of other complaints, including numbness on his torso and back pain.  When he arrived in the ER, he had CTA that ruled out dissection and PE.  CT head showed no CVA.  TnI POC was 0.12, repeat troponin 6.8.  Initially in the ER, he was bradycardic in the 40s with anterior nonspecific T wave inversions.  Most recent ECG showed HR 76 with no significant ECG changes.  Chest pain has been improved with treatment in the ER but he still has left arm pain.  1. CAD: NSTEMI.  Initial bradycardia raises concern for RCA lesion.  Will start heparin gtt and give ASA 325 and atorvastatin 80.  Will plan for LHC today without LV-gram.  Will get echo.  No beta blocker with bradycardia.  2.  CKD: Creatinine 1.2.  Patient had 100 cc contrast with CTA chest.  Given ongoing symptoms and elevated TnI, think it best to proceed with cardiac catheterization today.  Will hydrate with IV fluid and try to minimize contrast at cath (no LV-gram).  3. PAD: known diagnosis, stable.  4. HTN: Control with NTG gtt.   Larey Dresser 02/26/2014 4:43 PM

## 2014-02-26 NOTE — ED Notes (Signed)
CT called to try and go ahead to do the scan, CT unable to take pt at this time due to all scanners full.

## 2014-02-26 NOTE — ED Notes (Addendum)
Pt using language line with patient. Pt skin appears diaphoretic.

## 2014-02-26 NOTE — ED Notes (Signed)
Patient arrived via [facility] from home with mid and left sided chest pain. According to EMS grandson was interrupting for the patient. His language is [place]. Wife is at bedside. Denies any N/V/D, shortness of breath. Patient is restful at this time. He is very hypertensive. EMS states patient stroke screen was negative.

## 2014-02-26 NOTE — ED Notes (Signed)
Cardiology at bedside.

## 2014-02-26 NOTE — ED Notes (Signed)
Results of troponin given to [place], PA-C

## 2014-02-26 NOTE — Consult Note (Signed)
ANTICOAGULATION CONSULT NOTE - Initial Consult  Pharmacy Consult for Heparin Indication: chest pain/ACS  No Known Allergies  Patient Measurements: Height: 5\' 7"  (170.2 cm) Weight: 160 lb (72.576 kg) IBW/kg (Calculated) : 66.1 Heparin Dosing Weight: 72.5kg  Vital Signs: Temp: 97.6 F (36.4 C) (06/03 1246) BP: 165/119 mmHg (06/03 1515) Pulse Rate: 75 (06/03 1515)  Labs:  Recent Labs  02/26/14 1245 02/26/14 1302 02/26/14 1526  HGB 15.2 17.0  --   HCT 45.0 50.0  --   PLT 193  --   --   CREATININE 1.04 1.20  --   TROPONINI  --   --  6.80*    Estimated Creatinine Clearance: 58.1 ml/min (by C-G formula based on Cr of 1.2).   Medical History: Past Medical History  Diagnosis Date  . Hypertension   . Diabetes mellitus   . Stroke 09/26/2009    Syncope.  [place]  admission.  Marland Kitchen AAA (abdominal aortic aneurysm)   . Dissection of mesenteric artery     Medications:  No anticoagulants pta  Assessment: 63yom presents to the ED with chest pain and hypertension. First troponin is positive at 6.8. Of note, patient has a history of AAA and mesenteric artery dissection - CTA here ruled out aortic dissection and PE. Patient also reported left arm tingling - CT head negative. He will begin IV heparin for NSTEMI with plan for cath today. Creatinine is mildly elevated at 1.2, CBC ok.  Goal of Therapy:  Heparin level 0.3-0.7 units/ml Monitor platelets by anticoagulation protocol: Yes   Plan:  1) Heparin bolus 4000 units x 1 2) Heparin drip at 900 units/hr 3) Follow up afte cath  Benjamine Sprague Brentney Goldbach 02/26/2014,4:40 PM

## 2014-02-26 NOTE — ED Provider Notes (Signed)
CSN: [id number]     Arrival date & time 02/26/14  1221 History   First MD Initiated Contact with Patient 02/26/14 1225     Chief Complaint  Patient presents with  . Chest Pain     (Consider location/radiation/quality/duration/timing/severity/associated sxs/prior Treatment) HPI Comments: Per EMS, the patient c/o chest pain and found hypertensive at home. Per EMS, there is no N, V, D or SOB. The patient, via interpreter, reports that he does have pain in left chest, also in the left abdomen and left upper and lower extremities associated with chronic weakness since previous stroke 3 years ago. He states he has had ?new or increase of tingling in the left arm and pain in the lower extremity and he "feels bad". He confirms no vomiting but complains of nausea. He denies falls, syncope, fever, cough, SOB.   The history is provided by the patient and the spouse. A language interpreter was used Herbalist as well as familly at bedside interpreting.).    Past Medical History  Diagnosis Date  . Hypertension   . Diabetes mellitus   . Stroke 09/26/2009    Syncope.  [place]  admission.  [facility] Kitchen AAA (abdominal aortic aneurysm)   . Dissection of mesenteric artery    Past Surgical History  Procedure Laterality Date  . Admission  09/26/2009    CVA.  [name].   Family History  Problem Relation Age of Onset  . Diabetes Father   . Hypertension Father   . Heart attack Father    History  Substance Use Topics  . Smoking status: Former Smoker    Quit date: 06/09/2009  . Smokeless tobacco: Never Used  . Alcohol Use: No    Review of Systems  Constitutional: Negative for fever and chills.  Respiratory: Negative.  Negative for shortness of breath.   Cardiovascular: Positive for chest pain.  Gastrointestinal: Positive for nausea and abdominal pain. Negative for vomiting.  Genitourinary: Negative.   Musculoskeletal:       Extremity pain as in HPI.  Skin: Negative.   Neurological: Positive  for weakness. Negative for syncope and speech difficulty.       See HPI.  Psychiatric/Behavioral: Negative for confusion.      Allergies  Review of patient's allergies indicates no known allergies.  Home Medications   Prior to Admission medications   Medication Sig Start Date End Date Taking? Authorizing Provider  amLODipine (NORVASC) 5 MG tablet Take 5 mg by mouth daily.    Historical Provider, MD  metFORMIN (GLUCOPHAGE) 500 MG tablet Take 1 tablet (500 mg total) by mouth 2 (two) times daily with a meal. 08/02/12 08/02/13  Wardell Honour, MD  oxyCODONE-acetaminophen (PERCOCET/ROXICET) 5-325 MG per tablet Take 1-2 tablets by mouth every 4 (four) hours as needed. 12/06/13   Sharmon Leyden Nickel, NP   BP 156/90  Pulse 62  Temp(Src) 97.6 F (36.4 C)  Resp 16  SpO2 95% Physical Exam  Constitutional: He is oriented to person, place, and time. He appears well-developed and well-nourished.  HENT:  Head: Normocephalic and atraumatic.  Eyes: EOM are normal. Pupils are equal, round, and reactive to light.  Neck: Normal range of motion.  Cardiovascular: Normal rate and regular rhythm.   No murmur heard. Pulmonary/Chest: Effort normal and breath sounds normal. He has no wheezes. He has no rales.  Abdominal: Soft. There is no tenderness.  Musculoskeletal: He exhibits no edema.  Neurological: He is alert and oriented to person, place, and time. He has normal  strength and normal reflexes. No sensory deficit. He displays a negative Romberg sign.  CN's 3-12 grossly intact. He has left weakness of upper and lower extremity. Fine motor function preserved.   Skin:  Linear bruising to chest wall and upper back c/w home treatment of cupping.  Psychiatric: He has a normal mood and affect.    ED Course  Procedures (including critical care time) Labs Review Labs Reviewed  I-STAT TROPOININ, ED - Abnormal; Notable for the following:    Troponin i, poc 0.12 (*)    All other components within normal  limits  I-STAT CHEM 8, ED - Abnormal; Notable for the following:    Potassium 3.6 (*)    Glucose, Bld 194 (*)    All other components within normal limits  CBC  BASIC METABOLIC PANEL  PRO B NATRIURETIC PEPTIDE   Results for orders placed during the hospital encounter of 02/26/14  CBC      Result Value Ref Range   WBC 9.9  4.0 - 10.5 K/uL   RBC 5.16  4.22 - 5.81 MIL/uL   Hemoglobin 15.2  13.0 - 17.0 g/dL   HCT 45.0  39.0 - 52.0 %   MCV 87.2  78.0 - 100.0 fL   MCH 29.5  26.0 - 34.0 pg   MCHC 33.8  30.0 - 36.0 g/dL   RDW 13.0  11.5 - 15.5 %   Platelets 193  150 - 400 K/uL  BASIC METABOLIC PANEL      Result Value Ref Range   Sodium 138  137 - 147 mEq/L   Potassium 3.7  3.7 - 5.3 mEq/L   Chloride 103  96 - 112 mEq/L   CO2 25  19 - 32 mEq/L   Glucose, Bld 193 (*) 70 - 99 mg/dL   BUN 14  6 - 23 mg/dL   Creatinine, Ser 1.04  0.50 - 1.35 mg/dL   Calcium 8.9  8.4 - 10.5 mg/dL   GFR calc non Af Amer 74 (*) >90 mL/min   GFR calc Af Amer 86 (*) >90 mL/min  PRO B NATRIURETIC PEPTIDE      Result Value Ref Range   Pro B Natriuretic peptide (BNP) 324.7 (*) 0 - 125 pg/mL  I-STAT TROPOININ, ED      Result Value Ref Range   Troponin i, poc 0.12 (*) 0.00 - 0.08 ng/mL   Comment NOTIFIED PHYSICIAN     Comment 3           I-STAT CHEM 8, ED      Result Value Ref Range   Sodium 143  137 - 147 mEq/L   Potassium 3.6 (*) 3.7 - 5.3 mEq/L   Chloride 100  96 - 112 mEq/L   BUN 15  6 - 23 mg/dL   Creatinine, Ser 1.20  0.50 - 1.35 mg/dL   Glucose, Bld 194 (*) 70 - 99 mg/dL   Calcium, Ion 1.14  1.13 - 1.30 mmol/L   TCO2 27  0 - 100 mmol/L   Hemoglobin 17.0  13.0 - 17.0 g/dL   HCT 50.0  39.0 - 52.0 %   Ct Head Wo Contrast  02/26/2014   CLINICAL DATA:  Chest pain.  Leg pain.  EXAM: CT HEAD WITHOUT CONTRAST  TECHNIQUE: Contiguous axial images were obtained from the base of the skull through the vertex without intravenous contrast.  COMPARISON:  CT head without contrast 09/30/2009  FINDINGS: No acute  cortical infarct, hemorrhage, or mass lesion is present. The ventricles  are of normal size. No significant extra-axial fluid collection is evident. The paranasal sinuses and mastoid air cells are clear. The osseous skull is intact.  IMPRESSION: Negative CT of the head.   Electronically Signed   By: Lawrence Santiago M.D.   On: 02/26/2014 14:15   Dg Chest Portable 1 View  02/26/2014   CLINICAL DATA:  Chest pain.  EXAM: PORTABLE CHEST - 1 VIEW  COMPARISON:  None.  FINDINGS: Heart size is exaggerated by low lung volumes. Pulmonary vascular congestion is evident. No focal airspace disease is present.  IMPRESSION: 1. Low lung volumes and mild pulmonary vascular congestion. 2. No focal airspace disease.   Electronically Signed   By: Lawrence Santiago M.D.   On: 02/26/2014 13:30   Ct Angio Chest Aortic Dissect W &/or W/o  02/26/2014   CLINICAL DATA:  h/o dissection, mid and left-sided chest pain, hypertensive  EXAM: CT ANGIOGRAPHY CHEST, ABDOMEN AND PELVIS  TECHNIQUE: Multidetector CT imaging through the chest, abdomen and pelvis was performed using the standard protocol during bolus administration of intravenous contrast. Multiplanar reconstructed images including MIPs were obtained and reviewed to evaluate the vascular anatomy.  CONTRAST:  114mL OMNIPAQUE IOHEXOL 350 MG/ML SOLN  COMPARISON:  12/03/2012  FINDINGS: CTA CHEST FINDINGS  The noncontrast scout shows no hyperdense crescent, mediastinal hematoma, pleural or pericardial effusion. Patchy coronary calcifications and aortic arch calcifications are noted.  CTA reveals no dissection or stenosis. Classic 3 vessel brachiocephalic arterial origin anatomy without proximal lesion. There is mild plaque in the distal arch and descending segment. . No significant intramural thrombus. Satisfactory opacification of pulmonary arteries noted, and there is no evidence of pulmonary emboli.  Dependent atelectasis posteriorly in both lungs. No confluent airspace consolidation.  Review  of the MIP images confirms the above findings. Thoracic spine and sternum unremarkable.  CTA ABDOMEN AND PELVIS FINDINGS  Arterial findings:  Aorta: Mild scattered eccentric plaque. There is a focal ulceration just proximal to the bifurcation. No aneurysm, dissection, or stenosis.  Celiac axis: Short segment high-grade stenosis just beyond the origin at the level of the median arcuate ligament of the diaphragm, patent distally.  Superior mesenteric: Dissection in the proximal SMA, the false lumen thrombosed for length of 3.3 cm from its origin, perfused distally, extending over a length of approximately 4 cm, with some aneurysmal dilatation up to 13 mm diameter. No extension into branch vessels. Short segment extension into the middle colic branch as before. Stable appearance from prior study.  Left renal:          Single, patent.  Right renal:         Single, patent.  Inferior mesenteric: Short segment origin stenosis related to aortic wall plaque, patent distally.  Left iliac: Ectatic, 17 mm diameter, with some eccentric plaque in the common iliac. There is mild narrowing in the proximal internal iliac artery, patent distally. The external iliac is mildly tortuous, otherwise unremarkable.  Right iliac: Mild nonocclusive plaque through the common iliac and in the proximal internal iliac. External iliac widely patent, mildly tortuous.  Venous findings:     Dedicated venous phase imaging not obtained.  Review of the MIP images confirms the above findings.  Nonvascular findings: Unremarkable arterial phase evaluation of liver, spleen, adrenal glands, pancreas. Gallbladder is incompletely distended. Tiny bilateral renal cysts. Stomach distended by ingested material. Small bowel and colon are nondilated. Appendix not identified. No ascites. No free air. No adenopathy. Mild prostatic enlargement. Regional bones unremarkable.  IMPRESSION: 1. No aortic  dissection or other acute abnormality. 2. Stable dissection in the  superior mesenteric artery. 3. Stable 17 mm ectasia of the left common iliac artery. 4. Atherosclerosis, including aortoiliac and coronary artery disease. Please note that although the presence of coronary artery calcium documents the presence of coronary artery disease, the severity of this disease and any potential stenosis cannot be assessed on this non-gated CT examination. Assessment for potential risk factor modification, dietary therapy or pharmacologic therapy may be warranted, if clinically indicated.   Electronically Signed   By: Arne Cleveland M.D.   On: 02/26/2014 14:37   Ct Angio Abd/pel W/ And/or W/o  02/26/2014   CLINICAL DATA:  h/o dissection, mid and left-sided chest pain, hypertensive  EXAM: CT ANGIOGRAPHY CHEST, ABDOMEN AND PELVIS  TECHNIQUE: Multidetector CT imaging through the chest, abdomen and pelvis was performed using the standard protocol during bolus administration of intravenous contrast. Multiplanar reconstructed images including MIPs were obtained and reviewed to evaluate the vascular anatomy.  CONTRAST:  121mL OMNIPAQUE IOHEXOL 350 MG/ML SOLN  COMPARISON:  12/03/2012  FINDINGS: CTA CHEST FINDINGS  The noncontrast scout shows no hyperdense crescent, mediastinal hematoma, pleural or pericardial effusion. Patchy coronary calcifications and aortic arch calcifications are noted.  CTA reveals no dissection or stenosis. Classic 3 vessel brachiocephalic arterial origin anatomy without proximal lesion. There is mild plaque in the distal arch and descending segment. . No significant intramural thrombus. Satisfactory opacification of pulmonary arteries noted, and there is no evidence of pulmonary emboli.  Dependent atelectasis posteriorly in both lungs. No confluent airspace consolidation.  Review of the MIP images confirms the above findings. Thoracic spine and sternum unremarkable.  CTA ABDOMEN AND PELVIS FINDINGS  Arterial findings:  Aorta: Mild scattered eccentric plaque. There is a focal  ulceration just proximal to the bifurcation. No aneurysm, dissection, or stenosis.  Celiac axis: Short segment high-grade stenosis just beyond the origin at the level of the median arcuate ligament of the diaphragm, patent distally.  Superior mesenteric: Dissection in the proximal SMA, the false lumen thrombosed for length of 3.3 cm from its origin, perfused distally, extending over a length of approximately 4 cm, with some aneurysmal dilatation up to 13 mm diameter. No extension into branch vessels. Short segment extension into the middle colic branch as before. Stable appearance from prior study.  Left renal:          Single, patent.  Right renal:         Single, patent.  Inferior mesenteric: Short segment origin stenosis related to aortic wall plaque, patent distally.  Left iliac: Ectatic, 17 mm diameter, with some eccentric plaque in the common iliac. There is mild narrowing in the proximal internal iliac artery, patent distally. The external iliac is mildly tortuous, otherwise unremarkable.  Right iliac: Mild nonocclusive plaque through the common iliac and in the proximal internal iliac. External iliac widely patent, mildly tortuous.  Venous findings:     Dedicated venous phase imaging not obtained.  Review of the MIP images confirms the above findings.  Nonvascular findings: Unremarkable arterial phase evaluation of liver, spleen, adrenal glands, pancreas. Gallbladder is incompletely distended. Tiny bilateral renal cysts. Stomach distended by ingested material. Small bowel and colon are nondilated. Appendix not identified. No ascites. No free air. No adenopathy. Mild prostatic enlargement. Regional bones unremarkable.  IMPRESSION: 1. No aortic  dissection or other acute abnormality. 2. 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 3. Stable 17 mm ectasia of the left common iliac artery. 4. Atherosclerosis, including aortoiliac and  coronary artery disease. Please note that although the presence of  coronary artery calcium documents the presence of coronary artery disease, the severity of this disease and any potential stenosis cannot be assessed on this non-gated CT examination. Assessment for potential risk factor modification, dietary therapy or pharmacologic therapy may be warranted, if clinically indicated.   Electronically Signed   By: Arne Cleveland M.D.   On: 02/26/2014 14:37   Imaging Review No results found.   EKG Interpretation None      MDM   Final diagnoses:  None    1. TIA 2. Elevated troponin  On arrival the patient's heartrate is found to be severely bradycardic to 34, correlates with patient's increased malaise. Heart rate improved without intervention. Blood pressure found to be systolic of 91, which has also improved. Patient moved to trauma bay with defibrillator pads placed.   1:30 - discussed patient's presentation of hypertension --> hypotension, bradycardia, elevated troponin with Dr. Loralie Champagne. Advises continue with global work up, no cardiac intervention based on this presentation.  3:00 - CT angio studies and non-CM CT head reveal no acute changes. The patient's family is now at bedside and reports that their biggest concern was a period of sharply increased weakness, possible paralysis, of the left side that resolved over time. His blood pressure and heart rate have stabilized while in the ED without intervention. He is comfortable after treatment of pain in left leg with Morphine.   Discussed with Dr. Aundra Dubin who will provide cardiology consultation. Advises medicine to admit. Discussed with Dr. Sheran Fava of [facility] who accepts patient for admission.  Dewaine Oats, PA-C 02/26/14 1605

## 2014-02-26 NOTE — CV Procedure (Signed)
Cardiac Catheterization Procedure Note  Name: Barry [name] MRN: [id number] DOB: [birth year]  Procedure: Selective Coronary Angiography,  PTCA and drug eluting stenting of the right coronary artery  Indication: NSTEMI  Medications:  Sedation:  2 mg IV Versed, 25 mcg IV Fentanyl  Contrast:  175 ml Omnipaque  Procedural Details: The right wrist was prepped, draped, and anesthetized with 1% lidocaine. Using the modified Seldinger technique, a 5 French Slender sheath was introduced into the right radial artery. 3 mg of verapamil was administered through the sheath, weight-based unfractionated heparin was administered intravenously. A Jackie catheter was used for selective coronary angiography. There was severe tortuosity involving the right innominate artery with extreme difficulty in talking the catheter was. The left main coronary artery could not be selectively engaged in spite of using multiple catheters. I was able to perform nonselective angiography with an AL-1 catheter. The aortic valve could not be crossed for the same reason. Catheter exchanges were performed over an exchange length guidewire. There were no immediate procedural complications.  Procedural Findings:  Hemodynamics: AO:  143/79   mmHg  Coronary angiography: Coronary dominance: Right   Left Main:  Normal  Left Anterior Descending (LAD):  Normal in size with 20% mid stenosis. The rest of the vessel has minor irregularities. Faint left-to-right collaterals were noted. He and  1st diagonal (D1):  Normal in size with no significant disease.  2nd diagonal (D2):  Medium in size with minor irregularities.  3rd diagonal (D3):  Medium in size witt minor irregularities.  Circumflex (LCx):  Normal in size and nondominant. There is a 50% mid stenosis with no other significant disease.  1st obtuse marginal:  Medium in size with minor irregularities.  2nd obtuse marginal:  Normal in size with no significant disease.  3rd  obtuse marginal:  Normal in size with minor irregularities.    Right Coronary Artery: Very large in size and dominant. There is a complex 95% tubular proximal stenosis with plaque rupture and moderate amount of thrombus. The rest of the vessel has minor irregularities.  Posterior descending artery: Large in size with minor irregularities.  Posterior AV segment: Normal in size with minor irregularities.  Posterolateral branchs:  Minor irregularities with no significant disease.  Left ventriculography: Was not performed. The aortic valve could not be crossed.  PCI Note:  Following the diagnostic procedure, the decision was made to proceed with PCI.  Weight-based bivalirudin was given for anticoagulation. Once a therapeutic ACT was achieved, a 6 [place] JR 4 guide catheter was inserted.  A fielder XT coronary guidewire was used to cross the lesion. There was significant difficulty in crossing the lesion. I used multiple wires including the Runthrough, intuition, and a whisper wire.  The lesion was predilated with a 2.5 x 15 balloon.  The lesion was then stented with a 3.5 x 18 mm Xience drug-eluting stent.  The stent was postdilated with a 3.75 x 12 mm noncompliant balloon.  Following PCI, there was 0% residual stenosis and TIMI-3 flow. Final angiography confirmed an excellent result. The patient tolerated the procedure well. There were no immediate procedural complications. A TR band was used for radial hemostasis. The patient was transferred to the post catheterization recovery area for further monitoring.  PCI Data: Vessel - right coronary artery/Segment - proximal Percent Stenosis (pre)  95% TIMI-flow 3 Stent :3.5 x 18 mm Xience drug-eluting stent Percent Stenosis (post) 0 TIMI-flow (post) 3   Final Conclusions:  1. Severe one-vessel coronary artery disease with evidence of  plaque rupture and thrombotic 95% lesion in the proximal right coronary artery. 2. Successful angioplasty and  drug-eluting stent placement to the proximal RCA.  Recommendations:  This was a very difficult procedure via the right radial artery due to severe tortuosity in the innominate artery. Recommend avoiding catheterization in the future via the right radial artery.  Continue dual antiplatelet therapy for at least one year. Obtain an echocardiogram to evaluate LV systolic function. A beta blocker was not started due to episodes of bradycardia.  Wellington Hampshire MD, [facility] 02/26/2014, 6:47 PM

## 2014-02-26 NOTE — ED Notes (Signed)
Pt reporting increasing left sided CP, down his leg. PA made aware.

## 2014-02-26 NOTE — ED Provider Notes (Signed)
CRITICAL CARE Performed by: Osvaldo Shipper   Total critical care time: 30 minutes  Critical care time was exclusive of separately billable procedures and treating other patients.  Critical care was necessary to treat or prevent imminent or life-threatening deterioration.  Critical care was time spent personally by me on the following activities: development of treatment plan with patient and/or surrogate as well as nursing, discussions with consultants, evaluation of patient's response to treatment, examination of patient, obtaining history from patient or surrogate, ordering and performing treatments and interventions, ordering and review of laboratory studies, ordering and review of radiographic studies, pulse oximetry and re-evaluation of patient's condition.  Medical screening examination/treatment/procedure(s) were conducted as a shared visit with non-physician practitioner(s) and myself.  I personally evaluated the patient during the encounter.   EKG Interpretation None      59M presents with chest pain. L sided with some radiation to arm, states chronic, but worse. Initially hypertensive, but upon arrival, profoundly bradycardic at 30-40, and hypotensive 90/40s.  Upon moving patient to a Resuscitation room - BP normalized, HR back in the 70s.  Patient history through translator phone. Cards consulted - requested medical admit. Hospitalist admitting for possible TIA as he reported some intermittent numbness.  Repeat troponin up from 0.1 to 6.8. Cards taking patient to the Cath Lab.  1. TIA (transient ischemic attack)   2. Elevated troponin   3. Dissection of mesenteric artery   4. NSTEMI (non-ST elevated myocardial infarction)   5. Abdominal aneurysm without mention of rupture   6. CVA (cerebral vascular accident)   7. Mesenteric artery stenosis   8. Type 2 diabetes mellitus      Osvaldo Shipper, MD 02/27/14 [phone]

## 2014-02-27 ENCOUNTER — Encounter (HOSPITAL_COMMUNITY): Payer: Self-pay | Admitting: Neurology

## 2014-02-27 DIAGNOSIS — I5033 Acute on chronic diastolic (congestive) heart failure: Secondary | ICD-10-CM | POA: Diagnosis not present

## 2014-02-27 DIAGNOSIS — I5030 Unspecified diastolic (congestive) heart failure: Secondary | ICD-10-CM | POA: Insufficient documentation

## 2014-02-27 DIAGNOSIS — I369 Nonrheumatic tricuspid valve disorder, unspecified: Secondary | ICD-10-CM

## 2014-02-27 DIAGNOSIS — I472 Ventricular tachycardia: Secondary | ICD-10-CM | POA: Diagnosis not present

## 2014-02-27 DIAGNOSIS — G459 Transient cerebral ischemic attack, unspecified: Secondary | ICD-10-CM | POA: Diagnosis not present

## 2014-02-27 DIAGNOSIS — R5381 Other malaise: Secondary | ICD-10-CM

## 2014-02-27 DIAGNOSIS — IMO0002 Reserved for concepts with insufficient information to code with codable children: Secondary | ICD-10-CM

## 2014-02-27 DIAGNOSIS — E118 Type 2 diabetes mellitus with unspecified complications: Secondary | ICD-10-CM | POA: Diagnosis present

## 2014-02-27 DIAGNOSIS — I214 Non-ST elevation (NSTEMI) myocardial infarction: Secondary | ICD-10-CM | POA: Diagnosis not present

## 2014-02-27 DIAGNOSIS — I509 Heart failure, unspecified: Secondary | ICD-10-CM | POA: Diagnosis not present

## 2014-02-27 DIAGNOSIS — I959 Hypotension, unspecified: Secondary | ICD-10-CM | POA: Diagnosis not present

## 2014-02-27 DIAGNOSIS — I7779 Dissection of other artery: Secondary | ICD-10-CM | POA: Diagnosis not present

## 2014-02-27 DIAGNOSIS — R799 Abnormal finding of blood chemistry, unspecified: Secondary | ICD-10-CM | POA: Diagnosis not present

## 2014-02-27 DIAGNOSIS — I4729 Other ventricular tachycardia: Secondary | ICD-10-CM

## 2014-02-27 DIAGNOSIS — R5383 Other fatigue: Secondary | ICD-10-CM

## 2014-02-27 DIAGNOSIS — E1165 Type 2 diabetes mellitus with hyperglycemia: Secondary | ICD-10-CM

## 2014-02-27 DIAGNOSIS — I10 Essential (primary) hypertension: Secondary | ICD-10-CM

## 2014-02-27 LAB — COMPREHENSIVE METABOLIC PANEL
ALT: 45 U/L (ref 0–53)
AST: 135 U/L — ABNORMAL HIGH (ref 0–37)
Albumin: 3.3 g/dL — ABNORMAL LOW (ref 3.5–5.2)
Alkaline Phosphatase: 93 U/L (ref 39–117)
BUN: 11 mg/dL (ref 6–23)
CO2: 25 mEq/L (ref 19–32)
Calcium: 8.7 mg/dL (ref 8.4–10.5)
Chloride: 101 mEq/L (ref 96–112)
Creatinine, Ser: 1.04 mg/dL (ref 0.50–1.35)
GFR calc Af Amer: 86 mL/min — ABNORMAL LOW (ref >90)
GFR calc non Af Amer: 74 mL/min — ABNORMAL LOW (ref >90)
Glucose, Bld: 273 mg/dL — ABNORMAL HIGH (ref 70–99)
Potassium: 4.3 mEq/L (ref 3.7–5.3)
Sodium: 138 mEq/L (ref 137–147)
Total Bilirubin: 0.5 mg/dL (ref 0.3–1.2)
Total Protein: 6.7 g/dL (ref 6.0–8.3)

## 2014-02-27 LAB — CBC
HCT: 44.4 % (ref 39.0–52.0)
Hemoglobin: 15 g/dL (ref 13.0–17.0)
MCH: 29.5 pg (ref 26.0–34.0)
MCHC: 33.8 g/dL (ref 30.0–36.0)
MCV: 87.2 fL (ref 78.0–100.0)
Platelets: 190 10*3/uL (ref 150–400)
RBC: 5.09 MIL/uL (ref 4.22–5.81)
RDW: 13 % (ref 11.5–15.5)
WBC: 8.8 10*3/uL (ref 4.0–10.5)

## 2014-02-27 LAB — GLUCOSE, CAPILLARY
Glucose-Capillary: 172 mg/dL — ABNORMAL HIGH (ref 70–99)
Glucose-Capillary: 152 mg/dL — ABNORMAL HIGH (ref 70–99)
Glucose-Capillary: 208 mg/dL — ABNORMAL HIGH (ref 70–99)
Glucose-Capillary: 93 mg/dL (ref 70–99)

## 2014-02-27 LAB — LIPID PANEL
Cholesterol: 201 mg/dL — ABNORMAL HIGH (ref 0–200)
HDL: 35 mg/dL — ABNORMAL LOW (ref >39)
LDL Cholesterol: 136 mg/dL — ABNORMAL HIGH (ref 0–99)
Total CHOL/HDL Ratio: 5.7 RATIO
Triglycerides: 150 mg/dL — ABNORMAL HIGH (ref <150)
VLDL: 30 mg/dL (ref 0–40)

## 2014-02-27 LAB — HEMOGLOBIN A1C
Hgb A1c MFr Bld: 9.5 % — ABNORMAL HIGH (ref <5.7)
Mean Plasma Glucose: 226 mg/dL — ABNORMAL HIGH (ref <117)

## 2014-02-27 LAB — TROPONIN I: Troponin I: >20 ng/mL (ref <0.30)

## 2014-02-27 MED ORDER — INSULIN ASPART 100 UNIT/ML ~~LOC~~ SOLN
0.0000 [IU] | SUBCUTANEOUS | Status: DC
  Administered 2014-02-28: 2 [IU] via SUBCUTANEOUS
  Administered 2014-02-28: 11 [IU] via SUBCUTANEOUS

## 2014-02-27 MED ORDER — METOPROLOL TARTRATE 25 MG PO TABS
25.0000 mg | ORAL_TABLET | Freq: Two times a day (BID) | ORAL | Status: DC
  Administered 2014-02-27 (×2): 25 mg via ORAL
  Filled 2014-02-27 (×4): qty 1

## 2014-02-27 MED ORDER — ENOXAPARIN SODIUM 40 MG/0.4ML ~~LOC~~ SOLN
40.0000 mg | SUBCUTANEOUS | Status: DC
  Administered 2014-02-28: 40 mg via SUBCUTANEOUS
  Filled 2014-02-27 (×2): qty 0.4

## 2014-02-27 MED ORDER — PNEUMOCOCCAL VAC POLYVALENT 25 MCG/0.5ML IJ INJ
0.5000 mL | INJECTION | INTRAMUSCULAR | Status: AC
  Administered 2014-02-28: 0.5 mL via INTRAMUSCULAR
  Filled 2014-02-27: qty 0.5

## 2014-02-27 MED ORDER — INSULIN GLARGINE 100 UNIT/ML ~~LOC~~ SOLN
5.0000 [IU] | Freq: Every day | SUBCUTANEOUS | Status: DC
  Administered 2014-02-27 – 2014-02-28 (×2): 5 [IU] via SUBCUTANEOUS
  Filled 2014-02-27 (×2): qty 0.05

## 2014-02-27 MED ORDER — BIOTENE DRY MOUTH MT LIQD
15.0000 mL | Freq: Two times a day (BID) | OROMUCOSAL | Status: DC
  Administered 2014-02-27 (×2): 15 mL via OROMUCOSAL

## 2014-02-27 MED ORDER — CLOPIDOGREL BISULFATE 75 MG PO TABS
75.0000 mg | ORAL_TABLET | Freq: Every day | ORAL | Status: DC
  Administered 2014-02-28: 75 mg via ORAL
  Filled 2014-02-27: qty 1

## 2014-02-27 MED ORDER — SODIUM CHLORIDE 0.9 % IV SOLN: INTRAVENOUS | Status: DC

## 2014-02-27 MED FILL — Sodium Chloride IV Soln 0.9%: INTRAVENOUS | Qty: 50 | Status: AC

## 2014-02-27 NOTE — Progress Notes (Addendum)
Catitlin Freeman Caldron, RN requested RN stroke swallow screen be performed on Mr. Barry [name]. Upon initial assessment the patient was alert and oriented while breathing freely. 02 sats were maintained, voice quality was clear and lung sounds in all quadrants were clear to auscultation. The patient was sat up with the [facility] at 90 degrees.  During the screening the patient had no changes in breath sounds, voice quality, and no coughing was observed.   After the screening there were no changes noted and a pass was documented and reported to [place], Therapist, sports.

## 2014-02-27 NOTE — Progress Notes (Signed)
VASCULAR LAB PRELIMINARY  PRELIMINARY  PRELIMINARY  PRELIMINARY  Carotid Dopplers completed.    Preliminary report:  1-39% ICA stenosis.  Vertebral artery flow is antegrade.  Iantha Fallen, RVT 02/27/2014, 10:13 AM

## 2014-02-27 NOTE — Evaluation (Signed)
Speech Language Pathology Evaluation Patient Details Name: Barry [name] MRN: [id number] DOB: 1950/10/11 Today's Date: 02/27/2014 Time: 7169-6789 SLP Time Calculation (min): 16 min  Problem List:  Patient Active Problem List   Diagnosis Date Noted  . TIA (transient ischemic attack) 02/26/2014  . NSTEMI (non-ST elevated myocardial infarction) 02/26/2014  . Pain in limb-Abdomen  12/06/2013  . Weakness-Left arm / Lef 12/06/2013  . Abdominal aneurysm without mention of rupture 12/06/2013  . Mesenteric artery stenosis 12/06/2013  . Dissection of mesenteric artery 12/03/2012  . Essential hypertension, benign 08/02/2012  . Type 2 diabetes mellitus 08/02/2012  . Hypertension 11/10/2011  . CVA (cerebral vascular accident) 11/10/2011  . Language Barrier 11/10/2011  . Diabetes mellitus 11/10/2011   Past Medical History:  Past Medical History  Diagnosis Date  . Hypertension   . Diabetes mellitus   . Stroke 09/26/2009    Syncope.  [place]  admission.  [facility] Kitchen AAA (abdominal aortic aneurysm)   . Dissection of mesenteric artery    Past Surgical History:  Past Surgical History  Procedure Laterality Date  . Admission  09/26/2009    CVA.  [name].   HPI:  63 y/o [place] male (non English speaking) admitted with  left sided chest pain. PMH:  tobacco abuse, HTN, CVA 09/2009 with left sided weakness, AAA, disection of mesenteric artery.  CT head negative.  Per MD note, active problems include NSTEMI and TIA.  Underwent angioplasty on 6/3.   Assessment / Plan / Recommendation Clinical Impression  Pt. verbalized mild memory difficulties following CVA in 2011 and stated writing as a strategy and SLP provided additional strategies to implement.  Other aspects of cognition appeared WFL's and pt. has assist at home for finances (wife is responsible) and pt. able to manage medications without difficulty.  Speech in native language is clear and accurate per family.  No ST follow up is needed at this time.     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[employer])   Cognition  Overall Cognitive Status: History of cognitive impairments - at baseline Arousal/Alertness: Awake/alert Orientation Level: Oriented to person;Oriented to place;Oriented to situation;Oriented to time (min assist with month) Attention: Sustained Sustained Attention: Appears intact Memory:  (functional) Awareness: Appears intact Problem Solving: Appears intact Safety/Judgment: Appears intact    Comprehension  Auditory Comprehension Overall Auditory Comprehension: Appears within functional limits for tasks assessed Yes/No Questions: Within Functional Limits Commands: Within Functional Limits (3 step) Conversation: Simple Visual Recognition/Discrimination Discrimination: Not tested Reading Comprehension Reading Status: Not tested    Expression Expression Primary Mode of Expression: Verbal Verbal Expression Overall Verbal Expression: Appears within functional limits for tasks assessed Initiation: No impairment Level of Generative/Spontaneous Verbalization: Sentence Repetition:  (NT) Naming: No impairment Pragmatics: No impairment Written Expression Dominant Hand: Right Written Expression: Within Functional Limits   Oral / Motor Oral Motor/Sensory Function Overall Oral Motor/Sensory Function: Appears within functional limits for tasks assessed Motor Speech Overall Motor Speech: Appears within functional limits for tasks assessed Respiration: Within functional limits Phonation: Normal Resonance: Within functional limits Articulation: Within functional  limitis Intelligibility: Intelligible Motor Planning: Witnin functional limits        [employer]  Jannifer Franklin [employer] [phone]  02/27/2014  02/27/2014

## 2014-02-27 NOTE — Progress Notes (Signed)
Patient Name: Barry [name] Date of Encounter: 02/27/2014  Active Problems:   TIA (transient ischemic attack)   NSTEMI (non-ST elevated myocardial infarction)   Length of Stay: 1  SUBJECTIVE  No cardiac complaints. A few runs of NSVT overnight and this AM. Beta blockers had been held due to bradycardia, no longer an issue Some pain in his right iliac crest area, no worse when walking  CURRENT MEDS . antiseptic oral rinse  15 mL Mouth Rinse BID  . aspirin  324 mg Oral Once  . aspirin EC  81 mg Oral Daily  . atorvastatin  80 mg Oral q1800  . clopidogrel  75 mg Oral Q breakfast  . enoxaparin (LOVENOX) injection  40 mg Subcutaneous Q24H  . insulin aspart  0-5 Units Subcutaneous QHS  . insulin aspart  0-9 Units Subcutaneous TID WC  . lisinopril  20 mg Oral BID  .  morphine injection  4 mg Intravenous Once  . [START ON 02/28/2014] pneumococcal 23 valent vaccine  0.5 mL Intramuscular Tomorrow-1000    OBJECTIVE   Intake/Output Summary (Last 24 hours) at 02/27/14 1056 Last data filed at 02/27/14 1000  Gross per 24 hour  Intake 1101.64 ml  Output    675 ml  Net 426.64 ml   Filed Weights   02/26/14 1636 02/26/14 1900 02/27/14 0300  Weight: 160 lb (72.576 kg) 161 lb 2.5 oz (73.1 kg) 160 lb 7.9 oz (72.8 kg)    PHYSICAL EXAM Filed Vitals:   02/27/14 0700 02/27/14 0800 02/27/14 0900 02/27/14 1000  BP: 145/83 127/78 134/86 144/78  Pulse: 67  70 66  Temp:  98.9 F (37.2 C)    TempSrc:  Oral    Resp: 20 18 17 18   Height:      Weight:      SpO2: 97% 97% 97% 97%   General: Alert, oriented x3, no distress Head: no evidence of trauma, PERRL, EOMI, no exophtalmos or lid lag, no myxedema, no xanthelasma; normal ears, nose and oropharynx Neck: normal jugular venous pulsations and no hepatojugular reflux; brisk carotid pulses without delay and no carotid bruits Chest: clear to auscultation, no signs of consolidation by percussion or palpation, normal fremitus, symmetrical and full  respiratory excursions Cardiovascular: normal position and quality of the apical impulse, regular rhythm, normal first and second heart sounds, no rubs or gallops, no murmur Abdomen: no tenderness or distention, no masses by palpation, no abnormal pulsatility or arterial bruits, normal bowel sounds, no hepatosplenomegaly Extremities: healthy radial access site, no clubbing, cyanosis or edema; 2+ radial, ulnar and brachial pulses bilaterally; 2+ right femoral, posterior tibial and dorsalis pedis pulses; 2+ left femoral, posterior tibial and dorsalis pedis pulses; no subclavian or femoral bruits Neurological: grossly nonfocal  LABS  CBC  Recent Labs  02/26/14 1245 02/26/14 1302 02/27/14 0057  WBC 9.9  --  8.8  HGB 15.2 17.0 15.0  HCT 45.0 50.0 44.4  MCV 87.2  --  87.2  PLT 193  --  161   Basic Metabolic Panel  Recent Labs  02/26/14 1245 02/26/14 1302 02/27/14 0057  NA 138 143 138  K 3.7 3.6* 4.3  CL 103 100 101  CO2 25  --  25  GLUCOSE 193* 194* 273*  BUN 14 15 11   CREATININE 1.04 1.20 1.04  CALCIUM 8.9  --  8.7   Liver Function Tests  Recent Labs  02/27/14 0057  AST 135*  ALT 45  ALKPHOS 93  BILITOT 0.5  PROT 6.7  ALBUMIN  3.3*   No results found for this basename: LIPASE, AMYLASE,  in the last 72 hours Cardiac Enzymes  Recent Labs  02/26/14 1526 02/26/14 2200 02/27/14 0057  TROPONINI 6.80* >20.00* >20.00*   BNP No components found with this basename: POCBNP,  D-Dimer No results found for this basename: DDIMER,  in the last 72 hours Hemoglobin A1C No results found for this basename: HGBA1C,  in the last 72 hours Fasting Lipid Panel  Recent Labs  02/27/14 0057  CHOL 201*  HDL 35*  LDLCALC 136*  TRIG 150*  CHOLHDL 5.7   Thyroid Function Tests No results found for this basename: TSH, T4TOTAL, FREET3, T3FREE, THYROIDAB,  in the last 72 hours  Radiology Studies Imaging results have been reviewed and Ct Head Wo Contrast  02/26/2014   CLINICAL  DATA:  Chest pain.  Leg pain.  EXAM: CT HEAD WITHOUT CONTRAST  TECHNIQUE: Contiguous axial images were obtained from the base of the skull through the vertex without intravenous contrast.  COMPARISON:  CT head without contrast 09/30/2009  FINDINGS: No acute cortical infarct, hemorrhage, or mass lesion is present. The ventricles are of normal size. No significant extra-axial fluid collection is evident. The paranasal sinuses and mastoid air cells are clear. The osseous skull is intact.  IMPRESSION: Negative CT of the head.   Electronically Signed   By: Lawrence Santiago M.D.   On: 02/26/2014 14:15   Dg Chest Portable 1 View  02/26/2014   CLINICAL DATA:  Chest pain.  EXAM: PORTABLE CHEST - 1 VIEW  COMPARISON:  None.  FINDINGS: Heart size is exaggerated by low lung volumes. Pulmonary vascular congestion is evident. No focal airspace disease is present.  IMPRESSION: 1. Low lung volumes and mild pulmonary vascular congestion. 2. No focal airspace disease.   Electronically Signed   By: Lawrence Santiago M.D.   On: 02/26/2014 13:30   Ct Angio Chest Aortic Dissect W &/or W/o  02/26/2014   CLINICAL DATA:  h/o dissection, mid and left-sided chest pain, hypertensive  EXAM: CT ANGIOGRAPHY CHEST, ABDOMEN AND PELVIS  TECHNIQUE: Multidetector CT imaging through the chest, abdomen and pelvis was performed using the standard protocol during bolus administration of intravenous contrast. Multiplanar reconstructed images including MIPs were obtained and reviewed to evaluate the vascular anatomy.  CONTRAST:  196mL OMNIPAQUE IOHEXOL 350 MG/ML SOLN  COMPARISON:  12/03/2012  FINDINGS: CTA CHEST FINDINGS  The noncontrast scout shows no hyperdense crescent, mediastinal hematoma, pleural or pericardial effusion. Patchy coronary calcifications and aortic arch calcifications are noted.  CTA reveals no dissection or stenosis. Classic 3 vessel brachiocephalic arterial origin anatomy without proximal lesion. There is mild plaque in the distal arch  and descending segment. . No significant intramural thrombus. Satisfactory opacification of pulmonary arteries noted, and there is no evidence of pulmonary emboli.  Dependent atelectasis posteriorly in both lungs. No confluent airspace consolidation.  Review of the MIP images confirms the above findings. Thoracic spine and sternum unremarkable.  CTA ABDOMEN AND PELVIS FINDINGS  Arterial findings:  Aorta: Mild scattered eccentric plaque. There is a focal ulceration just proximal to the bifurcation. No aneurysm, dissection, or stenosis.  Celiac axis: Short segment high-grade stenosis just beyond the origin at the level of the median arcuate ligament of the diaphragm, patent distally.  Superior mesenteric: Dissection in the proximal SMA, the false lumen thrombosed for length of 3.3 cm from its origin, perfused distally, extending over a length of approximately 4 cm, with some aneurysmal dilatation up to 13 mm diameter. No  extension into branch vessels. Short segment extension into the middle colic branch as before. Stable appearance from prior study.  Left renal:          Single, patent.  Right renal:         Single, patent.  Inferior mesenteric: Short segment origin stenosis related to aortic wall plaque, patent distally.  Left iliac: Ectatic, 17 mm diameter, with some eccentric plaque in the common iliac. There is mild narrowing in the proximal internal iliac artery, patent distally. The external iliac is mildly tortuous, otherwise unremarkable.  Right iliac: Mild nonocclusive plaque through the common iliac and in the proximal internal iliac. External iliac widely patent, mildly tortuous.  Venous findings:     Dedicated venous phase imaging not obtained.  Review of the MIP images confirms the above findings.  Nonvascular findings: Unremarkable arterial phase evaluation of liver, spleen, adrenal glands, pancreas. Gallbladder is incompletely distended. Tiny bilateral renal cysts. Stomach distended by ingested  material. Small bowel and colon are nondilated. Appendix not identified. No ascites. No free air. No adenopathy. Mild prostatic enlargement. Regional bones unremarkable.  IMPRESSION: 1. No aortic  dissection or other acute abnormality. 2. 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 3. Stable 17 mm ectasia of the left common iliac artery. 4. Atherosclerosis, including aortoiliac and coronary artery disease. Please note that although the presence of coronary artery calcium documents the presence of coronary artery disease, the severity of this disease and any potential stenosis cannot be assessed on this non-gated CT examination. Assessment for potential risk factor modification, dietary therapy or pharmacologic therapy may be warranted, if clinically indicated.   Electronically Signed   By: Arne Cleveland M.D.   On: 02/26/2014 14:37   Ct Angio Abd/pel W/ And/or W/o  02/26/2014   CLINICAL DATA:  h/o dissection, mid and left-sided chest pain, hypertensive  EXAM: CT ANGIOGRAPHY CHEST, ABDOMEN AND PELVIS  TECHNIQUE: Multidetector CT imaging through the chest, abdomen and pelvis was performed using the standard protocol during bolus administration of intravenous contrast. Multiplanar reconstructed images including MIPs were obtained and reviewed to evaluate the vascular anatomy.  CONTRAST:  138mL OMNIPAQUE IOHEXOL 350 MG/ML SOLN  COMPARISON:  12/03/2012  FINDINGS: CTA CHEST FINDINGS  The noncontrast scout shows no hyperdense crescent, mediastinal hematoma, pleural or pericardial effusion. Patchy coronary calcifications and aortic arch calcifications are noted.  CTA reveals no dissection or stenosis. Classic 3 vessel brachiocephalic arterial origin anatomy without proximal lesion. There is mild plaque in the distal arch and descending segment. . No significant intramural thrombus. Satisfactory opacification of pulmonary arteries noted, and there is no evidence of pulmonary emboli.  Dependent atelectasis  posteriorly in both lungs. No confluent airspace consolidation.  Review of the MIP images confirms the above findings. Thoracic spine and sternum unremarkable.  CTA ABDOMEN AND PELVIS FINDINGS  Arterial findings:  Aorta: Mild scattered eccentric plaque. There is a focal ulceration just proximal to the bifurcation. No aneurysm, dissection, or stenosis.  Celiac axis: Short segment high-grade stenosis just beyond the origin at the level of the median arcuate ligament of the diaphragm, patent distally.  Superior mesenteric: Dissection in the proximal SMA, the false lumen thrombosed for length of 3.3 cm from its origin, perfused distally, extending over a length of approximately 4 cm, with some aneurysmal dilatation up to 13 mm diameter. No extension into branch vessels. Short segment extension into the middle colic branch as before. Stable appearance from prior study.  Left renal:  Single, patent.  Right renal:         Single, patent.  Inferior mesenteric: Short segment origin stenosis related to aortic wall plaque, patent distally.  Left iliac: Ectatic, 17 mm diameter, with some eccentric plaque in the common iliac. There is mild narrowing in the proximal internal iliac artery, patent distally. The external iliac is mildly tortuous, otherwise unremarkable.  Right iliac: Mild nonocclusive plaque through the common iliac and in the proximal internal iliac. External iliac widely patent, mildly tortuous.  Venous findings:     Dedicated venous phase imaging not obtained.  Review of the MIP images confirms the above findings.  Nonvascular findings: Unremarkable arterial phase evaluation of liver, spleen, adrenal glands, pancreas. Gallbladder is incompletely distended. Tiny bilateral renal cysts. Stomach distended by ingested material. Small bowel and colon are nondilated. Appendix not identified. No ascites. No free air. No adenopathy. Mild prostatic enlargement. Regional bones unremarkable.  IMPRESSION: 1. No aortic   dissection or other acute abnormality. 2. 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 3. Stable 17 mm ectasia of the left common iliac artery. 4. Atherosclerosis, including aortoiliac and coronary artery disease. Please note that although the presence of coronary artery calcium documents the presence of coronary artery disease, the severity of this disease and any potential stenosis cannot be assessed on this non-gated CT examination. Assessment for potential risk factor modification, dietary therapy or pharmacologic therapy may be warranted, if clinically indicated.   Electronically Signed   By: Arne Cleveland M.D.   On: 02/26/2014 14:37    TELE NSR, rare and short runs of NSVT  ECG NSR,  LVH with lateral repol changes  ASSESSMENT AND PLAN  Day 1 s/p emergency PCI for inferior STEMI No signs of CHF Bedside review of echo shows LVEF around 45%, with a large area of inferolateral wall hypokinesis Troponin >20  Start beta blockers. Transfer to telemetry. Possible DC tomorrow.   Sanda Klein, MD, [facility] [phone] office [phone] pager 02/27/2014 10:56 AM

## 2014-02-27 NOTE — Progress Notes (Signed)
CARDIAC REHAB PHASE I   PRE:  Rate/Rhythm: 67 SR  BP:  Supine: 144/78  Sitting:   Standing:    SaO2: 97 2L  MODE:  Ambulation: 350 ft   POST:  Rate/Rhythm: 72 SR  BP:  Supine:   Sitting: 142/74  Standing:    SaO2: 98 RA 1130-1225 Assisted X 1 to ambulate with hand held assist. Gait steady, slight left limp. Family in room states that he does not use walker or cane to walk. Pt was able to walk 350 feet without c/o of cp or SOB. VS stable. Pt to recliner after walk. I discussed with family in room that we would need to go over discharge information with pt tomorrow. They would like [place] to get a interpreter for tomorrow. I called social worker office to start process of setting up interpreter. We need to call [phone] to let them know what time that we will need for them to be here.  Rodney Langton RN 02/27/2014 12:10 PM

## 2014-02-27 NOTE — Progress Notes (Signed)
PT Cancellation Note  Patient Details Name: Barry [name] MRN: [id number] DOB: May 01, 1951   Cancelled Treatment:    Reason Eval/Treat Not Completed: Medical issues which prohibited therapy.   PT eval received, chart reviewed. Last 2 troponin draws >20.00 ng/mL. Will continue to follow and eval when medically  appropriate.   Jolyn Lent 02/27/2014, 12:17 PM  Jolyn Lent, PT, DPT Acute Rehabilitation Services Pager: [phone]

## 2014-02-27 NOTE — Progress Notes (Signed)
Utilization Review Completed.Neoma Laming T Dowell6/12/2013

## 2014-02-27 NOTE — Progress Notes (Signed)
OT Cancellation Note  Patient Details Name: Barry [name] MRN: [id number] DOB: 08/07/1951   Cancelled Treatment:    Reason Eval/Treat Not Completed: Patient not medically ready  Last 2 troponin draws >20.00 ng/mL. Will continue to follow and eval when medically appropriate.   Peri Maris Pager: [phone]  02/27/2014, 1:52 PM

## 2014-02-27 NOTE — Progress Notes (Signed)
Echocardiogram 2D Echocardiogram has been performed.  Barry [name] 02/27/2014, 8:01 AM

## 2014-02-27 NOTE — Progress Notes (Signed)
Transferred to [facility] room 4 by wheelchair, stable, belongings with grandson, report given to RN.

## 2014-02-27 NOTE — Consult Note (Signed)
[facility] Medical Consultation  Phong Stakes HBZ:[id number] DOB: 1950-12-31 DOA: 02/26/2014 PCP: No primary provider on file.   Requesting physician: Dr. Laren Everts Date of consultation: 02/26/2014 Reason for consultation: Left arm weakness  Impression/Recommendations Active Problems:   TIA (transient ischemic attack)   NSTEMI (non-ST elevated myocardial infarction) initial troponin 6.8   NSTEMI,  -6/4 initial troponin 6.8, current troponin> 20  - ECG demonstrates no ischemic changes  - Continue Lipitor 80 mg daily   - Cautious use of BB given recent bradycardia  - Continue lisinopril 20 mg BID   - lipid panel  - 6/4 Cycle troponins; trending down - Continue Aspirin  - Per cardiology Plavix 75 mg daily  - NTG per cardiology   Diastolic CHF -See echocardiogram results below -Per cardiology  Vascular congestion on CXR suggestive of heart failure  - DC IVF    TIA  -Left arm weakness resolved no further workup required   - Carotid duplex; WNL  - ECHO; diastolic CHF  - PT/OT/SLP pending    PVD with chronic dissection of SMA  - Continue Lipitor 80 mg daily   - Start antiplatelet agent per cardiology   Uncontrolled diabetes type 2  -Hemoglobin A1c = 9.5  -Lantus 5 units daily  - Increase to moderate SSI  - Continue to Hold metformin   HTN  - Retart lisinopril 20 mg BID  Procedure/Significant Events:   6/3 Selective Coronary Angiography, PTCA and drug eluting stenting of the right coronary artery -Severe one-vessel CAD w/ evidence of plaque rupture and thrombotic 95% lesion proximal RCA  -Angioplasty w/ placement drug-eluting stent proximal RCA. 6/4 echocardiogram -Left ventricle: moderate focal basal and mild concentric hypertrophy of the left ventricle.  -LVEF= 50% to 55%.  - (grade 1 diastolic dysfunction).  6/4 carotid Doppler;1-39% ICA stenosis. Vertebral artery flow is antegrade   Culture    Antibiotics:     DVT prophylaxis:   Lovenox  Devices     LINES / TUBES:    I will followup again tomorrow. Please contact me if I can be of assistance in the meanwhile. Thank you for this consultation.  Chief Complaint: Lt Arm Weakness  HPI:  63 y.o. 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 No primary care doctor currently. He has chronic chest pain of the left chest but he recently developed worsening of the left chest pain to 9/10 with radiation down the left arm and into the left abdomen and left leg. Denies associated SOB, V, D, but + nausea. Movement of the left arm worsens his pain. Pain was associated with some decreased sensation or numbness of the left arm and leg. Denies LOC.  In ER, initially presented with mild bradycardia to the mid-30s with hypotension 90s over 50s. This resolved spontaneously without atropine. Most recent blood pressure 155/119. CBC and BMP wnl except for elevated blood glucose. Initial i-STAT troponin 0.12, followup serum troponin 6.8. ECG without acute ST segment changes. ProBNP 324. Chest x-ray demonstrated mild pulmonary vascular congestion without acute disease. CT scan showed chest abdomen and pelvis demonstrated no acute dissection, stable dissection of the superior mesenteric artery, stable 17 mm ectasia of the left common iliac artery, atherosclerosis diffusely with a short segment of stenosis of the inferior mesenteric artery. Cardiology planning to take to cath lab. 6/4 C/O  of continuing CP/SOB   Review of Systems:  CP, SOB  Past Medical History  Diagnosis Date  . Hypertension   . Diabetes mellitus   . Stroke 09/26/2009    Syncope.  [facility]  admission.  Marland Kitchen AAA (abdominal aortic aneurysm)   . Dissection of mesenteric artery    Past Surgical History  Procedure Laterality Date  . Admission   09/26/2009    CVA.  [name].   Social History:  reports that he quit smoking about 4 years ago. He has never used smokeless tobacco. He reports that he does not drink alcohol or use illicit drugs.  No Known Allergies Family History  Problem Relation Age of Onset  . Diabetes Father   . Hypertension Father   . Heart attack Father     Prior to Admission medications   Medication Sig Start Date End Date Taking? Authorizing Provider  lisinopril (PRINIVIL,ZESTRIL) 20 MG tablet Take 20 mg by mouth 2 (two) times daily.   Yes Historical Provider, MD  metFORMIN (GLUCOPHAGE) 500 MG tablet Take 1 tablet (500 mg total) by mouth 2 (two) times daily with a meal. 08/02/12 02/26/14 Yes Wardell Honour, MD   Physical Exam: Blood pressure 126/68, pulse 65, temperature 98.1 F (36.7 C), temperature source Oral, resp. rate 13, height 5\' 7"  (1.702 m), weight 72.8 kg (160 lb 7.9 oz), SpO2 97.00%. Filed Vitals:   02/27/14 0900 02/27/14 1000 02/27/14 1155 02/27/14 1558  BP: 134/86 144/78 142/74 126/68  Pulse: 70 66 65 65  Temp:   98.1 F (36.7 C)   TempSrc:   Oral   Resp: 17 18 17 13   Height:      Weight:      SpO2: 97% 97% 96% 97%     General:  A/Ox 4, NAD  Eyes: PERRLA  Neck: (-) JVD, (-) Lymphandenopathy  Cardiovascular: RRR, (-) M/R/G, Nl S1/S2  Respiratory: CTA Bilat  Abdomen: Soft, NT, ND, (+) BS   Skin: Covered in Tattos  Musculoskeletal: (-) Pedal Edema   Labs on Admission:  Basic Metabolic Panel:  Recent Labs Lab 02/26/14 1245 02/26/14 1302 02/27/14 0057  NA 138 143 138  K 3.7 3.6* 4.3  CL 103 100 101  CO2 25  --  25  GLUCOSE 193* 194* 273*  BUN 14 15 11   CREATININE 1.04 1.20 1.04  CALCIUM 8.9  --  8.7   Liver Function Tests:  Recent Labs Lab 02/27/14 0057  AST 135*  ALT 45  ALKPHOS 93  BILITOT 0.5  PROT 6.7  ALBUMIN 3.3*   No results found for this basename: LIPASE, AMYLASE,  in the last 168 hours No results found for this basename: AMMONIA,  in the  last 168 hours CBC:  Recent Labs Lab 02/26/14 1245 02/26/14 1302 02/27/14 0057  WBC 9.9  --  8.8  HGB 15.2 17.0 15.0  HCT 45.0 50.0 44.4  MCV 87.2  --  87.2  PLT 193  --  190   Cardiac Enzymes:  Recent Labs Lab 02/26/14 1526 02/26/14 2200 02/27/14 0057  TROPONINI 6.80* >20.00* >20.00*   BNP: No components found with this basename: POCBNP,  CBG:  Recent Labs Lab 02/26/14 2126 02/27/14 0812 02/27/14 1156 02/27/14 1815  GLUCAP 162* 172* 152* 208*    Radiological Exams on Admission: Ct Head Wo Contrast  02/26/2014   CLINICAL DATA:  Chest pain.  Leg pain.  EXAM: CT HEAD WITHOUT CONTRAST  TECHNIQUE: Contiguous axial images were obtained from the base of  the skull through the vertex without intravenous contrast.  COMPARISON:  CT head without contrast 09/30/2009  FINDINGS: No acute cortical infarct, hemorrhage, or mass lesion is present. The ventricles are of normal size. No significant extra-axial fluid collection is evident. The paranasal sinuses and mastoid air cells are clear. The osseous skull is intact.  IMPRESSION: Negative CT of the head.   Electronically Signed   By: Lawrence Santiago M.D.   On: 02/26/2014 14:15   Dg Chest Portable 1 View  02/26/2014   CLINICAL DATA:  Chest pain.  EXAM: PORTABLE CHEST - 1 VIEW  COMPARISON:  None.  FINDINGS: Heart size is exaggerated by low lung volumes. Pulmonary vascular congestion is evident. No focal airspace disease is present.  IMPRESSION: 1. Low lung volumes and mild pulmonary vascular congestion. 2. No focal airspace disease.   Electronically Signed   By: Lawrence Santiago M.D.   On: 02/26/2014 13:30   Ct Angio Chest Aortic Dissect W &/or W/o  02/26/2014   CLINICAL DATA:  h/o dissection, mid and left-sided chest pain, hypertensive  EXAM: CT ANGIOGRAPHY CHEST, ABDOMEN AND PELVIS  TECHNIQUE: Multidetector CT imaging through the chest, abdomen and pelvis was performed using the standard protocol during bolus administration of intravenous  contrast. Multiplanar reconstructed images including MIPs were obtained and reviewed to evaluate the vascular anatomy.  CONTRAST:  13mL OMNIPAQUE IOHEXOL 350 MG/ML SOLN  COMPARISON:  12/03/2012  FINDINGS: CTA CHEST FINDINGS  The noncontrast scout shows no hyperdense crescent, mediastinal hematoma, pleural or pericardial effusion. Patchy coronary calcifications and aortic arch calcifications are noted.  CTA reveals no dissection or stenosis. Classic 3 vessel brachiocephalic arterial origin anatomy without proximal lesion. There is mild plaque in the distal arch and descending segment. . No significant intramural thrombus. Satisfactory opacification of pulmonary arteries noted, and there is no evidence of pulmonary emboli.  Dependent atelectasis posteriorly in both lungs. No confluent airspace consolidation.  Review of the MIP images confirms the above findings. Thoracic spine and sternum unremarkable.  CTA ABDOMEN AND PELVIS FINDINGS  Arterial findings:  Aorta: Mild scattered eccentric plaque. There is a focal ulceration just proximal to the bifurcation. No aneurysm, dissection, or stenosis.  Celiac axis: Short segment high-grade stenosis just beyond the origin at the level of the median arcuate ligament of the diaphragm, patent distally.  Superior mesenteric: Dissection in the proximal SMA, the false lumen thrombosed for length of 3.3 cm from its origin, perfused distally, extending over a length of approximately 4 cm, with some aneurysmal dilatation up to 13 mm diameter. No extension into branch vessels. Short segment extension into the middle colic branch as before. Stable appearance from prior study.  Left renal:          Single, patent.  Right renal:         Single, patent.  Inferior mesenteric: Short segment origin stenosis related to aortic wall plaque, patent distally.  Left iliac: Ectatic, 17 mm diameter, with some eccentric plaque in the common iliac. There is mild narrowing in the proximal internal iliac  artery, patent distally. The external iliac is mildly tortuous, otherwise unremarkable.  Right iliac: Mild nonocclusive plaque through the common iliac and in the proximal internal iliac. External iliac widely patent, mildly tortuous.  Venous findings:     Dedicated venous phase imaging not obtained.  Review of the MIP images confirms the above findings.  Nonvascular findings: Unremarkable arterial phase evaluation of liver, spleen, adrenal glands, pancreas. Gallbladder is incompletely distended. Tiny bilateral renal cysts. Stomach  distended by ingested material. Small bowel and colon are nondilated. Appendix not identified. No ascites. No free air. No adenopathy. Mild prostatic enlargement. Regional bones unremarkable.  IMPRESSION: 1. No aortic  dissection or other acute abnormality. 2. 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 3. Stable 17 mm ectasia of the left common iliac artery. 4. Atherosclerosis, including aortoiliac and coronary artery disease. Please note that although the presence of coronary artery calcium documents the presence of coronary artery disease, the severity of this disease and any potential stenosis cannot be assessed on this non-gated CT examination. Assessment for potential risk factor modification, dietary therapy or pharmacologic therapy may be warranted, if clinically indicated.   Electronically Signed   By: Arne Cleveland M.D.   On: 02/26/2014 14:37   Ct Angio Abd/pel W/ And/or W/o  02/26/2014   CLINICAL DATA:  h/o dissection, mid and left-sided chest pain, hypertensive  EXAM: CT ANGIOGRAPHY CHEST, ABDOMEN AND PELVIS  TECHNIQUE: Multidetector CT imaging through the chest, abdomen and pelvis was performed using the standard protocol during bolus administration of intravenous contrast. Multiplanar reconstructed images including MIPs were obtained and reviewed to evaluate the vascular anatomy.  CONTRAST:  130mL OMNIPAQUE IOHEXOL 350 MG/ML SOLN  COMPARISON:  12/03/2012   FINDINGS: CTA CHEST FINDINGS  The noncontrast scout shows no hyperdense crescent, mediastinal hematoma, pleural or pericardial effusion. Patchy coronary calcifications and aortic arch calcifications are noted.  CTA reveals no dissection or stenosis. Classic 3 vessel brachiocephalic arterial origin anatomy without proximal lesion. There is mild plaque in the distal arch and descending segment. . No significant intramural thrombus. Satisfactory opacification of pulmonary arteries noted, and there is no evidence of pulmonary emboli.  Dependent atelectasis posteriorly in both lungs. No confluent airspace consolidation.  Review of the MIP images confirms the above findings. Thoracic spine and sternum unremarkable.  CTA ABDOMEN AND PELVIS FINDINGS  Arterial findings:  Aorta: Mild scattered eccentric plaque. There is a focal ulceration just proximal to the bifurcation. No aneurysm, dissection, or stenosis.  Celiac axis: Short segment high-grade stenosis just beyond the origin at the level of the median arcuate ligament of the diaphragm, patent distally.  Superior mesenteric: Dissection in the proximal SMA, the false lumen thrombosed for length of 3.3 cm from its origin, perfused distally, extending over a length of approximately 4 cm, with some aneurysmal dilatation up to 13 mm diameter. No extension into branch vessels. Short segment extension into the middle colic branch as before. Stable appearance from prior study.  Left renal:          Single, patent.  Right renal:         Single, patent.  Inferior mesenteric: Short segment origin stenosis related to aortic wall plaque, patent distally.  Left iliac: Ectatic, 17 mm diameter, with some eccentric plaque in the common iliac. There is mild narrowing in the proximal internal iliac artery, patent distally. The external iliac is mildly tortuous, otherwise unremarkable.  Right iliac: Mild nonocclusive plaque through the common iliac and in the proximal internal iliac. External  iliac widely patent, mildly tortuous.  Venous findings:     Dedicated venous phase imaging not obtained.  Review of the MIP images confirms the above findings.  Nonvascular findings: Unremarkable arterial phase evaluation of liver, spleen, adrenal glands, pancreas. Gallbladder is incompletely distended. Tiny bilateral renal cysts. Stomach distended by ingested material. Small bowel and colon are nondilated. Appendix not identified. No ascites. No free air. No adenopathy. Mild prostatic enlargement. Regional bones unremarkable.  IMPRESSION: 1.  No aortic  dissection or other acute abnormality. 2. 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 3. Stable 17 mm ectasia of the left common iliac artery. 4. Atherosclerosis, including aortoiliac and coronary artery disease. Please note that although the presence of coronary artery calcium documents the presence of coronary artery disease, the severity of this disease and any potential stenosis cannot be assessed on this non-gated CT examination. Assessment for potential risk factor modification, dietary therapy or pharmacologic therapy may be warranted, if clinically indicated.   Electronically Signed   By: Arne Cleveland M.D.   On: 02/26/2014 14:37    EKG:   Time spent: 35 min  [facility] Pager [phone]  If 7PM-7AM, please contact night-coverage [email or web address] Password [email or web address] 02/27/2014, 8:01 PM

## 2014-02-27 NOTE — ED Provider Notes (Signed)
Medical screening examination/treatment/procedure(s) were conducted as a shared visit with non-physician practitioner(s) and myself.  I personally evaluated the patient during the encounter.   EKG Interpretation None      Please see my other note for my documentation.  Osvaldo Shipper, MD 02/27/14 1540

## 2014-02-27 NOTE — Progress Notes (Signed)
MD made aware about the short runs of v-tach and claimed that it is expected. No order given , continue to monitor.

## 2014-02-28 DIAGNOSIS — I509 Heart failure, unspecified: Secondary | ICD-10-CM

## 2014-02-28 DIAGNOSIS — I498 Other specified cardiac arrhythmias: Secondary | ICD-10-CM

## 2014-02-28 DIAGNOSIS — I2510 Atherosclerotic heart disease of native coronary artery without angina pectoris: Secondary | ICD-10-CM | POA: Diagnosis present

## 2014-02-28 DIAGNOSIS — E785 Hyperlipidemia, unspecified: Secondary | ICD-10-CM | POA: Diagnosis present

## 2014-02-28 DIAGNOSIS — G459 Transient cerebral ischemic attack, unspecified: Secondary | ICD-10-CM | POA: Diagnosis not present

## 2014-02-28 DIAGNOSIS — I5033 Acute on chronic diastolic (congestive) heart failure: Secondary | ICD-10-CM | POA: Diagnosis not present

## 2014-02-28 DIAGNOSIS — I959 Hypotension, unspecified: Secondary | ICD-10-CM | POA: Diagnosis not present

## 2014-02-28 DIAGNOSIS — I10 Essential (primary) hypertension: Secondary | ICD-10-CM

## 2014-02-28 DIAGNOSIS — Z9119 Patient's noncompliance with other medical treatment and regimen: Secondary | ICD-10-CM

## 2014-02-28 DIAGNOSIS — I214 Non-ST elevation (NSTEMI) myocardial infarction: Secondary | ICD-10-CM | POA: Diagnosis not present

## 2014-02-28 DIAGNOSIS — E119 Type 2 diabetes mellitus without complications: Secondary | ICD-10-CM

## 2014-02-28 DIAGNOSIS — I5023 Acute on chronic systolic (congestive) heart failure: Secondary | ICD-10-CM | POA: Diagnosis present

## 2014-02-28 DIAGNOSIS — R001 Bradycardia, unspecified: Secondary | ICD-10-CM | POA: Diagnosis present

## 2014-02-28 DIAGNOSIS — Z91199 Patient's noncompliance with other medical treatment and regimen due to unspecified reason: Secondary | ICD-10-CM

## 2014-02-28 DIAGNOSIS — I5032 Chronic diastolic (congestive) heart failure: Secondary | ICD-10-CM | POA: Diagnosis present

## 2014-02-28 LAB — GLUCOSE, CAPILLARY
Glucose-Capillary: 321 mg/dL — ABNORMAL HIGH (ref 70–99)
Glucose-Capillary: 110 mg/dL — ABNORMAL HIGH (ref 70–99)
Glucose-Capillary: 131 mg/dL — ABNORMAL HIGH (ref 70–99)
Glucose-Capillary: 114 mg/dL — ABNORMAL HIGH (ref 70–99)

## 2014-02-28 LAB — CBC
HCT: 44.4 % (ref 39.0–52.0)
Hemoglobin: 14.7 g/dL (ref 13.0–17.0)
MCH: 29.2 pg (ref 26.0–34.0)
MCHC: 33.1 g/dL (ref 30.0–36.0)
MCV: 88.1 fL (ref 78.0–100.0)
Platelets: 175 10*3/uL (ref 150–400)
RBC: 5.04 MIL/uL (ref 4.22–5.81)
RDW: 13.4 % (ref 11.5–15.5)
WBC: 8.6 10*3/uL (ref 4.0–10.5)

## 2014-02-28 MED ORDER — CLOPIDOGREL BISULFATE 75 MG PO TABS: 75.0000 mg | ORAL_TABLET | Freq: Every day | ORAL | Status: DC

## 2014-02-28 MED ORDER — METOPROLOL TARTRATE 12.5 MG HALF TABLET
12.5000 mg | ORAL_TABLET | Freq: Two times a day (BID) | ORAL | Status: DC
  Administered 2014-02-28: 12.5 mg via ORAL
  Filled 2014-02-28 (×2): qty 1

## 2014-02-28 MED ORDER — NITROGLYCERIN 0.4 MG SL SUBL: 0.4000 mg | SUBLINGUAL_TABLET | SUBLINGUAL | Status: DC | PRN

## 2014-02-28 MED ORDER — ATORVASTATIN CALCIUM 80 MG PO TABS: 80.0000 mg | ORAL_TABLET | Freq: Every day | ORAL | Status: DC

## 2014-02-28 MED ORDER — METOPROLOL TARTRATE 12.5 MG HALF TABLET: 12.5000 mg | ORAL_TABLET | Freq: Two times a day (BID) | ORAL | Status: DC

## 2014-02-28 MED ORDER — METFORMIN HCL 500 MG PO TABS: 500.0000 mg | ORAL_TABLET | Freq: Two times a day (BID) | ORAL | Status: DC

## 2014-02-28 MED ORDER — ASPIRIN 81 MG PO TBEC: 81.0000 mg | DELAYED_RELEASE_TABLET | Freq: Every day | ORAL | Status: DC

## 2014-02-28 NOTE — Progress Notes (Signed)
[id number] Cardiac Rehab On arrival pt's daughter in room and states that she wants to interpreter for her father. Pt signed release of responsibility form. Completed MI and stent education using his daughter Avelino Herren as interpreter. I stressed importance of taking his Plavix and all of his medications. His daughter reports that he has not taken his medications due to not having refills. He has no primary care physician. Case manger to see pt. They voice understanding. Pt declines Outpt. CRP, he wants to do his exercise at home on his own. Deon Pilling, RN 02/28/2014 11:23 AM

## 2014-02-28 NOTE — Progress Notes (Addendum)
    Subjective:  No chest pain  Objective:  Vital Signs in the last 24 hours: Temp:  [98.1 F (36.7 C)-98.7 F (37.1 C)] 98.5 F (36.9 C) (06/05 0400) Pulse Rate:  [55-76] 55 (06/05 0400) Resp:  [13-18] 16 (06/05 0400) BP: (120-155)/(59-86) 120/76 mmHg (06/05 0400) SpO2:  [95 %-99 %] 96 % (06/05 0400) Weight:  [162 lb 1.6 oz (73.528 kg)] 162 lb 1.6 oz (73.528 kg) (06/05 0400)  Intake/Output from previous day:  Intake/Output Summary (Last 24 hours) at 02/28/14 0803 Last data filed at 02/27/14 1300  Gross per 24 hour  Intake  384.5 ml  Output      0 ml  Net  384.5 ml    Physical Exam: General appearance: alert, cooperative and no distress Lungs: clear to auscultation bilaterally Heart: regular rate and rhythm Extremities: Rt wrist without hematoma   Rate: 43-55  Rhythm: sinus bradycardia  Lab Results:  Recent Labs  02/27/14 0057 02/28/14 0550  WBC 8.8 8.6  HGB 15.0 14.7  PLT 190 175    Recent Labs  02/26/14 1245 02/26/14 1302 02/27/14 0057  NA 138 143 138  K 3.7 3.6* 4.3  CL 103 100 101  CO2 25  --  25  GLUCOSE 193* 194* 273*  BUN 14 15 11   CREATININE 1.04 1.20 1.04    Recent Labs  02/26/14 2200 02/27/14 0057  TROPONINI >20.00* >20.00*   No results found for this basename: INR,  in the last 72 hours  Imaging: Imaging results have been reviewed  Cardiac Studies:  Assessment/Plan:  63 y/o [place] male with a past medical history significant for HTN, DM, stroke in 2011, AAA and mesenteric ischemia with h/o mesenteric artery dissection. He is followed by Dr. Charleston Ropes. His home meds include lisinopril and Metformin. He is not currently followed by a PCP and ran out of his lisinopril and metformin 2 weeks ago. He also has a 40+ year h/o ongoing tobacco abuse. His stroke in 2011 left him with residual left sided weakness. He presented 02/26/14 with chest pain and bradycardia (required Atropine in ER). He ruled in for NSTEMI.  He had RCA DES placed  02/26/14. EF 45% by echo. Diabetic medications resumed.    Principal Problem:   NSTEMI (non-ST elevated myocardial infarction) Active Problems:   CAD- RCA BMS 02/27/14   Type II or unspecified type diabetes mellitus with unspecified complication, uncontrolled   Bradycardia- beta blocker decreased   Hypertension   CVA (cerebral vascular accident)   Language barrier   Mesenteric artery stenosis   Non compliance with medical treatment   Dyslipidemia   PLAN: Discharge, f/u with Dr Aundra Dubin in 1-2 weeks. I cut his Metoprolol back to 12.5 mg BID and stressed the importance of medication compliance.   Kerin Ransom PA-C Beeper [phone] 02/28/2014, 8:03 AM  S/p NSTEMI with PCI complicated by A on C DHF - resolved.  No further CP or DOE, lying flat.  Ambulating   On ASA/Plavix -- stressed importance of compliance  BP & HR stable - reduced BB dose + ACE-I. Statin.   No residual Neuro Sx.  Close f/u on d/c.    Will provide work excuse note for his wife to allow her to be out of work during his recovery period.  Will need to bring FMLA papers to clinic ROV.  Leonie Man, M.D., M.S. Interventional Cardiologist   Pager # [phone] 02/28/2014

## 2014-02-28 NOTE — Progress Notes (Signed)
OT Cancellation Note  Patient Details Name: [name] MRN: [id number] DOB: 05/23/1951   Cancelled Treatment:    Reason Eval/Treat Not Completed: OT screened, no needs identified, will sign off - spoke with RN who reports pt has been moving in the room independently.  Spoke with daughter who reports pt seems to be at or close to baseline and no concerns for discharge.  Will sign off.   [place], OTR/L [phone] 02/28/2014, 12:45 PM

## 2014-02-28 NOTE — Discharge Instructions (Signed)
Coronary Angiography With Stent, Care After Refer to this sheet in the next few weeks. These instructions provide you with information on caring for yourself after your procedure. Your health care provider may also give you more specific instructions. Your treatment has been planned according to current medical practices, but problems sometimes occur. Call your health care provider if you have any problems or questions after your procedure.  WHAT TO EXPECT AFTER THE PROCEDURE  The insertion site may be tender for a few days after your procedure. HOME CARE INSTRUCTIONS   Only take over-the-counter or prescription medicines as directed by your health care provider. Take all medicines exactly as instructed. Blood thinners may be prescribed after your procedure to improve blood flow through the stent.  Change any bandages (dressings) as directed by your health care provider.   Check your insertion site every day for redness, swelling, or fluid leaking from the insertion.   You may take showers. Pat the insertion area dry. Do not rub the insertion area with a washcloth or towel.   Eat a heart-healthy diet. This should include plenty of fresh fruits and vegetables. Meat should be lean cuts. Avoid the following types of food:   Food that is high in salt.   Canned or highly processed food.   Food that is high in saturated fat or sugar.   Fried food.   Make any other lifestyle changes recommended by your health care provider. This may include:   Quitting smoking.   Managing your weight.   Getting regular exercise.   Managing your blood pressure.   Limiting your alcohol intake.   Managing other health problems, such as diabetes.   If you need an MRI after your heart stent was placed, be sure to tell the health care provider who orders the MRI that you have a heart stent.   Follow up with your health care provider as directed.  SEEK IMMEDIATE MEDICAL CARE IF:   You  develop chest pain, shortness of breath, feel faint, or pass out.  You have bleeding, swelling larger than a walnut, or drainage from the catheter insertion site.  You develop pain, discoloration, coldness, or severe bruising in the leg or arm that held the catheter.  You develop bleeding from any other place such as from the bowels. There may be bright red blood in the urine or stools, or it may appear as black, tarry stools.  You have a fever or chills. MAKE SURE YOU:  Understand these instructions.  Will watch your condition.  Will get help right away if you are not doing well or get worse. Document Released: 04/01/2005 Document Revised: 05/15/2013 Document Reviewed: 02/13/2013 [facility] Patient Information 2014 [place]. Myocardial Infarction A myocardial infarction (MI) is also called a heart attack. It causes damage to the heart that cannot be fixed. An MI often happens when a blood clot or other blockage cuts blood flow to the heart. When this happens, certain areas of the heart begin to die. This is an emergency. HOME CARE  Take medicine as told by your doctor.  Change certain behaviors as told by your doctor. This may include:  Quitting smoking.  Being active.  Keeping a healthy weight.  Eating a heart-healthy diet. Ask your doctor for help with this diet.  Keeping your diabetes under control.  Lessening stress.  Limiting how much alcohol you drink. GET HELP RIGHT AWAY IF:  You have crushing or pressure-like chest pain that spreads to the arms, back, neck,  or jaw. Call your local emergency services (911 in [place] Do not drive yourself to the hospital.  You have severe chest pain.  You have shortness of breath during rest, sleep, or with activity.  You have sudden sweating or clammy skin.  You feel sick to your stomach (nauseous) and throw up (vomit).  You suddenly get lightheaded or dizzy.  You feel your heart beating fast or skipping beats. MAKE  SURE YOU:   Understand these instructions.  Will watch your condition.  Will get help right away if you are not doing well or get worse. Document Released: 03/13/2012 Document Reviewed: 03/13/2012 [facility] Patient Information 2014 [place], [place].     STROKE/TIA DISCHARGE INSTRUCTIONS SMOKING Cigarette smoking nearly doubles your risk of having a stroke & is the single most alterable risk factor  If you smoke or have smoked in the last 12 months, you are advised to quit smoking for your health.  Most of the excess cardiovascular risk related to smoking disappears within a year of stopping.  Ask you doctor about anti-smoking medications  [place] Quit Line: 1-800-QUIT NOW  Free Smoking Cessation Classes [phone]  CHOLESTEROL Know your levels; limit fat & cholesterol in your diet  Lipid Panel     Component Value Date/Time   CHOL 201* 02/27/2014 0057   TRIG 150* 02/27/2014 0057   HDL 35* 02/27/2014 0057   CHOLHDL 5.7 02/27/2014 0057   VLDL 30 02/27/2014 0057   LDLCALC 136* 02/27/2014 0057      Many patients benefit from treatment even if their cholesterol is at goal.  Goal: Total Cholesterol (CHOL) less than 160  Goal:  Triglycerides (TRIG) less than 150  Goal:  HDL greater than 40  Goal:  LDL (LDLCALC) less than 100   BLOOD PRESSURE American Stroke Association blood pressure target is less that 120/80 mm/Hg  Your discharge blood pressure is:  BP: 133/77 mmHg  Monitor your blood pressure  Limit your salt and alcohol intake  Many individuals will require more than one medication for high blood pressure  DIABETES (A1c is a blood sugar average for last 3 months) Goal HGBA1c is under 7% (HBGA1c is blood sugar average for last 3 months)  Diabetes: Diagnosis of diabetes:  Your A1c:9.5 %    Lab Results  Component Value Date   HGBA1C 9.5* 02/27/2014     Your HGBA1c can be lowered with medications, healthy diet, and exercise.  Check your blood sugar as directed by your  physician  Call your physician if you experience unexplained or low blood sugars.  PHYSICAL ACTIVITY/REHABILITATION Goal is 30 minutes at least 4 days per week  Activity: Increase activity slowly, Therapies:  Return to work:   Activity decreases your risk of heart attack and stroke and makes your heart stronger.  It helps control your weight and blood pressure; helps you relax and can improve your mood.  Participate in a regular exercise program.  Talk with your doctor about the best form of exercise for you (dancing, walking, swimming, cycling).  DIET/WEIGHT Goal is to maintain a healthy weight  Your discharge diet is: Carb Control  liquids Your height is:  Height: 5\' 7"  (170.2 cm) Your current weight is: Weight: 73.528 kg (162 lb 1.6 oz) Your Body Mass Index (BMI) is:  BMI (Calculated): 25.3  Following the type of diet specifically designed for you will help prevent another stroke.  Your goal weight range is:    Your goal Body Mass Index (BMI) is 19-24.  Healthy food habits can help reduce 3 risk factors for stroke:  High cholesterol, hypertension, and excess weight.  RESOURCES Stroke/Support Group:  Call [phone]   STROKE EDUCATION PROVIDED/REVIEWED AND GIVEN TO PATIENT Stroke warning signs and symptoms How to activate emergency medical system (call 911). Medications prescribed at discharge. Need for follow-up after discharge. Personal risk factors for stroke. Pneumonia vaccine given: Yes, Date 02/28/2014 Flu vaccine given: No My questions have been answered, the writing is legible, and I understand these instructions.  I will adhere to these goals & educational materials that have been provided to me after my discharge from the hospital.

## 2014-02-28 NOTE — Clinical Documentation Improvement (Signed)
Please clarify acuity of Diastolic CHF. Thank you.  Possible Clinical Conditions?   Chronic Diastolic Congestive Heart Failure Acute Diastolic Congestive Heart Failure Acute on Chronic Diastolic Congestive Heart Failure Other Condition________________________________________ Cannot Clinically Determine  Supporting Information:  Risk Factors: 6/4 Consult note: Diastolic CHF NSTEM S/p Cardiac cath Signs & Symptoms: SOB  Diagnostics: 6/3 CXR: FINDINGS: Heart size is exaggerated by low lung volumes. Pulmonary vascular congestion is evident. No focal airspace disease is present. IMPRESSION: 1. Low lung volumes and mild pulmonary vascular congestion. 2. No focal airspace disease.  6/3 BNP: 324.7 6/4 Echo: -LVEF= 50% to 55%.   Treatment: IV saline locked Daily weights Monitor strict I&O q shift  Thank You, Estella Husk ,RN Clinical Documentation Specialist:  [place] Information Management

## 2014-02-28 NOTE — Discharge Summary (Signed)
Patient ID: Barry [name],  MRN: [id number], DOB/AGE: 1950/12/31 63 y.o.  Admit date: 02/26/2014 Discharge date: 02/28/2014  Primary Care Provider:  Primary Cardiologist: Dr Aundra Dubin (new)  Discharge Diagnoses Principal Problem:   NSTEMI (non-ST elevated myocardial infarction) Active Problems:   CAD- RCA BMS 02/27/14   Type II or unspecified type diabetes mellitus with unspecified complication, uncontrolled   Bradycardia- beta blocker decreased   Hypertension   CVA (cerebral vascular accident)   Language barrier   Mesenteric artery stenosis   Non compliance with medical treatment   Dyslipidemia    Procedures:  Cath/ RCA DES 02/26/14   Hospital Course:  63 y/o [place] male with a past medical history significant for HTN, DM, stroke in 2011 with residual Lt arm weakness, AAA and mesenteric ischemia with h/o mesenteric artery dissection followed by Dr. Charleston Ropes. His home meds include lisinopril and Metformin. He is not currently followed by a PCP and ran out of his lisinopril and metformin 2 weeks ago. He also has a 40+ year h/o tobacco abuse, quit 2010. He presented 02/26/14 with chest pain and bradycardia (required Atropine in ER). He ruled in for NSTEMI. He was admitted and seen in consult by [facility] for DM. He had an RCA DES placed 02/26/14 for single vessel disease. His EF was 45% by echo. Diabetic medications resumed and we feel he can be discharged 02/28/14.   Discharge Vitals:  Blood pressure 120/76, pulse 55, temperature 98.5 F (36.9 C), temperature source Oral, resp. rate 16, height _0  (1.702 m), weight 162 lb 1.6 oz (73.528 kg), SpO2 96.00%.    Labs: Results for orders placed during the hospital encounter of 02/26/14 (from the past 48 hour(s))  CBC     Status: None   Collection Time    02/26/14 12:45 PM      Result Value Ref Range   WBC 9.9  4.0 - 10.5 K/uL   RBC 5.16  4.22 - 5.81 MIL/uL   Hemoglobin 15.2  13.0 - 17.0 g/dL   HCT 45.0  39.0 - 52.0 %   MCV 87.2   78.0 - 100.0 fL   MCH 29.5  26.0 - 34.0 pg   MCHC 33.8  30.0 - 36.0 g/dL   RDW 13.0  11.5 - 15.5 %   Platelets 193  150 - 400 K/uL  BASIC METABOLIC PANEL     Status: Abnormal   Collection Time    02/26/14 12:45 PM      Result Value Ref Range   Sodium 138  137 - 147 mEq/L   Potassium 3.7  3.7 - 5.3 mEq/L   Chloride 103  96 - 112 mEq/L   CO2 25  19 - 32 mEq/L   Glucose, Bld 193 (*) 70 - 99 mg/dL   BUN 14  6 - 23 mg/dL   Creatinine, Ser 1.04  0.50 - 1.35 mg/dL   Calcium 8.9  8.4 - 10.5 mg/dL   GFR calc non Af Amer 74 (*) >90 mL/min   GFR calc Af Amer 86 (*) >90 mL/min   Comment: (NOTE)     The eGFR has been calculated using the CKD EPI equation.     This calculation has not been validated in all clinical situations.     eGFR's persistently <90 mL/min signify possible Chronic Kidney     Disease.  PRO B NATRIURETIC PEPTIDE     Status: Abnormal   Collection Time    02/26/14 12:45 PM  Result Value Ref Range   Pro B Natriuretic peptide (BNP) 324.7 (*) 0 - 125 pg/mL  I-STAT TROPOININ, ED     Status: Abnormal   Collection Time    02/26/14  1:00 PM      Result Value Ref Range   Troponin i, poc 0.12 (*) 0.00 - 0.08 ng/mL   Comment NOTIFIED PHYSICIAN     Comment 3            Comment: Due to the release kinetics of cTnI,     a negative result within the first hours     of the onset of symptoms does not rule out     myocardial infarction with certainty.     If myocardial infarction is still suspected,     repeat the test at appropriate intervals.  I-STAT CHEM 8, ED     Status: Abnormal   Collection Time    02/26/14  1:02 PM      Result Value Ref Range   Sodium 143  137 - 147 mEq/L   Potassium 3.6 (*) 3.7 - 5.3 mEq/L   Chloride 100  96 - 112 mEq/L   BUN 15  6 - 23 mg/dL   Creatinine, Ser 1.20  0.50 - 1.35 mg/dL   Glucose, Bld 194 (*) 70 - 99 mg/dL   Calcium, Ion 1.14  1.13 - 1.30 mmol/L   TCO2 27  0 - 100 mmol/L   Hemoglobin 17.0  13.0 - 17.0 g/dL   HCT 50.0  39.0 - 52.0 %    TROPONIN I     Status: Abnormal   Collection Time    02/26/14  3:26 PM      Result Value Ref Range   Troponin I 6.80 (*) <0.30 ng/mL   Comment:            Due to the release kinetics of cTnI,     a negative result within the first hours     of the onset of symptoms does not rule out     myocardial infarction with certainty.     If myocardial infarction is still suspected,     repeat the test at appropriate intervals.     CRITICAL RESULT CALLED TO, READ BACK BY AND VERIFIED WITH:     B TAWNSON,RN 02/26/14 1622 WBOND  POCT ACTIVATED CLOTTING TIME     Status: None   Collection Time    02/26/14  6:04 PM      Result Value Ref Range   Activated Clotting Time 930    MRSA PCR SCREENING     Status: None   Collection Time    02/26/14  7:04 PM      Result Value Ref Range   MRSA by PCR NEGATIVE  NEGATIVE   Comment:            The GeneXpert MRSA Assay (FDA     approved for NASAL specimens     only), is one component of a     comprehensive MRSA colonization     surveillance program. It is not     intended to diagnose MRSA     infection nor to guide or     monitor treatment for     MRSA infections.  GLUCOSE, CAPILLARY     Status: Abnormal   Collection Time    02/26/14  9:26 PM      Result Value Ref Range   Glucose-Capillary 162 (*) 70 - 99 mg/dL  TROPONIN I  Status: Abnormal   Collection Time    02/26/14 10:00 PM      Result Value Ref Range   Troponin I >20.00 (*) <0.30 ng/mL   Comment:            Due to the release kinetics of cTnI,     a negative result within the first hours     of the onset of symptoms does not rule out     myocardial infarction with certainty.     If myocardial infarction is still suspected,     repeat the test at appropriate intervals.     CRITICAL VALUE NOTED.  VALUE IS CONSISTENT WITH PREVIOUSLY REPORTED AND CALLED VALUE.  LIPID PANEL     Status: Abnormal   Collection Time    02/27/14 12:57 AM      Result Value Ref Range   Cholesterol 201 (*) 0 -  200 mg/dL   Triglycerides 150 (*) <150 mg/dL   HDL 35 (*) >39 mg/dL   Total CHOL/HDL Ratio 5.7     VLDL 30  0 - 40 mg/dL   LDL Cholesterol 136 (*) 0 - 99 mg/dL   Comment:            Total Cholesterol/HDL:CHD Risk     Coronary Heart Disease Risk Table                         Men   Women      1/2 Average Risk   3.4   3.3      Average Risk       5.0   4.4      2 X Average Risk   9.6   7.1      3 X Average Risk  23.4   11.0                Use the calculated Patient Ratio     above and the CHD Risk Table     to determine the patient's CHD Risk.                ATP III CLASSIFICATION (LDL):      <100     mg/dL   Optimal      100-129  mg/dL   Near or Above                        Optimal      130-159  mg/dL   Borderline      160-189  mg/dL   High      >190     mg/dL   Very High  CBC     Status: None   Collection Time    02/27/14 12:57 AM      Result Value Ref Range   WBC 8.8  4.0 - 10.5 K/uL   RBC 5.09  4.22 - 5.81 MIL/uL   Hemoglobin 15.0  13.0 - 17.0 g/dL   HCT 44.4  39.0 - 52.0 %   MCV 87.2  78.0 - 100.0 fL   MCH 29.5  26.0 - 34.0 pg   MCHC 33.8  30.0 - 36.0 g/dL   RDW 13.0  11.5 - 15.5 %   Platelets 190  150 - 400 K/uL  COMPREHENSIVE METABOLIC PANEL     Status: Abnormal   Collection Time    02/27/14 12:57 AM      Result Value Ref Range   Sodium 138  137 - 147 mEq/L   Potassium 4.3  3.7 - 5.3 mEq/L   Chloride 101  96 - 112 mEq/L   CO2 25  19 - 32 mEq/L   Glucose, Bld 273 (*) 70 - 99 mg/dL   BUN 11  6 - 23 mg/dL   Creatinine, Ser 1.04  0.50 - 1.35 mg/dL   Calcium 8.7  8.4 - 10.5 mg/dL   Total Protein 6.7  6.0 - 8.3 g/dL   Albumin 3.3 (*) 3.5 - 5.2 g/dL   AST 135 (*) 0 - 37 U/L   ALT 45  0 - 53 U/L   Alkaline Phosphatase 93  39 - 117 U/L   Total Bilirubin 0.5  0.3 - 1.2 mg/dL   GFR calc non Af Amer 74 (*) >90 mL/min   GFR calc Af Amer 86 (*) >90 mL/min   Comment: (NOTE)     The eGFR has been calculated using the CKD EPI equation.     This calculation has not been  validated in all clinical situations.     eGFR's persistently <90 mL/min signify possible Chronic Kidney     Disease.  HEMOGLOBIN A1C     Status: Abnormal   Collection Time    02/27/14 12:57 AM      Result Value Ref Range   Hemoglobin A1C 9.5 (*) <5.7 %   Comment: (NOTE)                                                                               According to the ADA Clinical Practice Recommendations for 2011, when     HbA1c is used as a screening test:      >=6.5%   Diagnostic of Diabetes Mellitus               (if abnormal result is confirmed)     5.7-6.4%   Increased risk of developing Diabetes Mellitus     References:Diagnosis and Classification of Diabetes Mellitus,Diabetes     XOVA,9191,66(MAYOK 1):S62-S69 and Standards of Medical Care in             Diabetes - 2011,Diabetes Care,2011,34 (Suppl 1):S11-S61.   Mean Plasma Glucose 226 (*) <117 mg/dL   Comment: Performed at [facility]  TROPONIN I     Status: Abnormal   Collection Time    02/27/14 12:57 AM      Result Value Ref Range   Troponin I >20.00 (*) <0.30 ng/mL   Comment:            Due to the release kinetics of cTnI,     a negative result within the first hours     of the onset of symptoms does not rule out     myocardial infarction with certainty.     If myocardial infarction is still suspected,     repeat the test at appropriate intervals.     CRITICAL VALUE NOTED.  VALUE IS CONSISTENT WITH PREVIOUSLY REPORTED AND CALLED VALUE.  GLUCOSE, CAPILLARY     Status: Abnormal   Collection Time    02/27/14  8:12 AM      Result Value Ref Range   Glucose-Capillary 172 (*) 70 - 99  mg/dL  GLUCOSE, CAPILLARY     Status: Abnormal   Collection Time    02/27/14 11:56 AM      Result Value Ref Range   Glucose-Capillary 152 (*) 70 - 99 mg/dL  GLUCOSE, CAPILLARY     Status: Abnormal   Collection Time    02/27/14  6:15 PM      Result Value Ref Range   Glucose-Capillary 208 (*) 70 - 99 mg/dL  GLUCOSE, CAPILLARY      Status: None   Collection Time    02/27/14  9:18 PM      Result Value Ref Range   Glucose-Capillary 93  70 - 99 mg/dL  GLUCOSE, CAPILLARY     Status: Abnormal   Collection Time    02/28/14 12:22 AM      Result Value Ref Range   Glucose-Capillary 321 (*) 70 - 99 mg/dL  GLUCOSE, CAPILLARY     Status: Abnormal   Collection Time    02/28/14  4:19 AM      Result Value Ref Range   Glucose-Capillary 110 (*) 70 - 99 mg/dL  CBC     Status: None   Collection Time    02/28/14  5:50 AM      Result Value Ref Range   WBC 8.6  4.0 - 10.5 K/uL   RBC 5.04  4.22 - 5.81 MIL/uL   Hemoglobin 14.7  13.0 - 17.0 g/dL   HCT 44.4  39.0 - 52.0 %   MCV 88.1  78.0 - 100.0 fL   MCH 29.2  26.0 - 34.0 pg   MCHC 33.1  30.0 - 36.0 g/dL   RDW 13.4  11.5 - 15.5 %   Platelets 175  150 - 400 K/uL  GLUCOSE, CAPILLARY     Status: Abnormal   Collection Time    02/28/14  7:35 AM      Result Value Ref Range   Glucose-Capillary 131 (*) 70 - 99 mg/dL    Disposition:  Follow-up Information   Follow up with Loralie Champagne, MD. (You will see Dr Aundra Dubin or his PA/NP- office will call you)    Specialty:  Cardiology   Contact information:   [address] [place] [address] [phone]       Discharge Medications:    Medication List         aspirin 81 MG EC tablet  Take 1 tablet (81 mg total) by mouth daily.     atorvastatin 80 MG tablet  Commonly known as:  LIPITOR  Take 1 tablet (80 mg total) by mouth daily at 6 PM.     clopidogrel 75 MG tablet  Commonly known as:  PLAVIX  Take 1 tablet (75 mg total) by mouth daily with breakfast.     lisinopril 20 MG tablet  Commonly known as:  PRINIVIL,ZESTRIL  Take 20 mg by mouth 2 (two) times daily.     metFORMIN 500 MG tablet  Commonly known as:  GLUCOPHAGE  Take 1 tablet (500 mg total) by mouth 2 (two) times daily with a meal.     metoprolol tartrate 12.5 mg Tabs tablet  Commonly known as:  LOPRESSOR  Take 0.5 tablets (12.5 mg total)  by mouth 2 (two) times daily.     nitroGLYCERIN 0.4 MG SL tablet  Commonly known as:  NITROSTAT  Place 1 tablet (0.4 mg total) under the tongue every 5 (five) minutes as needed for chest pain.  Duration of Discharge Encounter: Greater than 30 minutes including physician time.  Signed, Kerin Ransom PA-C 02/28/2014 8:25 AM  S/p NSTEMI with PCI complicated by A on C DHF - resolved.  No further CP or DOE, lying flat.  Ambulating   On ASA/Plavix -- stressed importance of compliance  BP & HR stable - reduced BB dose + ACE-I. Statin.   Neuro Stable sp TIA  Close f/u on d/c.    Will provide work excuse note for his wife to allow her to be out of work during his recovery period.  Will need to bring FMLA papers to clinic ROV.  Leonie Man, M.D., M.S. Interventional Cardiologist   Pager # [phone] 02/28/2014

## 2014-02-28 NOTE — Care Management Note (Signed)
    Page 1 of 1   02/28/2014     12:03:31 PM CARE MANAGEMENT NOTE 02/28/2014  Patient:  Barry [name], Barry [name]   Account Number:  [id number]  Date Initiated:  02/27/2014  Documentation initiated by:  Elissa Hefty  Subjective/Objective Assessment:   adm w mi     Action/Plan:   lives w fam   Anticipated DC Date:     Anticipated DC Plan:           Choice offered to / List presented to:             Status of service:  Completed, signed off Medicare Important Message given?  YES (If response is "NO", the following Medicare IM given date fields will be blank) Date Medicare IM given:  02/28/2014 Date Additional Medicare IM given:    Discharge Disposition:  HOME/SELF CARE  Per UR Regulation:  Reviewed for med. necessity/level of care/duration of stay  If discussed at [place] of Stay Meetings, dates discussed:    Comments:  02-28-14 1201 Jacqlyn Krauss, RN,BSN [phone] CM spoke to pt's daughter and provided her with the [facility] Number for PCP. No further needs expressed at this time from family. Pt has medicare coverage therefore unable to assist with medications.

## 2014-03-07 ENCOUNTER — Encounter (HOSPITAL_COMMUNITY): Payer: Self-pay | Admitting: Emergency Medicine

## 2014-03-07 ENCOUNTER — Emergency Department (HOSPITAL_COMMUNITY): Payer: Medicare Other

## 2014-03-07 ENCOUNTER — Inpatient Hospital Stay (HOSPITAL_COMMUNITY)
Admission: EM | Admit: 2014-03-07 | Discharge: 2014-03-11 | DRG: 246 | Disposition: A | Discharge status: Home / Self Care | Payer: Medicare Other | Attending: Cardiology | Admitting: Cardiology

## 2014-03-07 ENCOUNTER — Encounter: Payer: Self-pay | Admitting: Surgery

## 2014-03-07 DIAGNOSIS — R079 Chest pain, unspecified: Secondary | ICD-10-CM | POA: Diagnosis not present

## 2014-03-07 DIAGNOSIS — R55 Syncope and collapse: Secondary | ICD-10-CM | POA: Diagnosis present

## 2014-03-07 DIAGNOSIS — I214 Non-ST elevation (NSTEMI) myocardial infarction: Secondary | ICD-10-CM

## 2014-03-07 DIAGNOSIS — R0789 Other chest pain: Secondary | ICD-10-CM | POA: Diagnosis not present

## 2014-03-07 DIAGNOSIS — Z9861 Coronary angioplasty status: Secondary | ICD-10-CM | POA: Diagnosis not present

## 2014-03-07 DIAGNOSIS — Z9119 Patient's noncompliance with other medical treatment and regimen: Secondary | ICD-10-CM

## 2014-03-07 DIAGNOSIS — I2510 Atherosclerotic heart disease of native coronary artery without angina pectoris: Secondary | ICD-10-CM | POA: Diagnosis not present

## 2014-03-07 DIAGNOSIS — I200 Unstable angina: Principal | ICD-10-CM

## 2014-03-07 DIAGNOSIS — I714 Abdominal aortic aneurysm, without rupture, unspecified: Secondary | ICD-10-CM

## 2014-03-07 DIAGNOSIS — Z7902 Long term (current) use of antithrombotics/antiplatelets: Secondary | ICD-10-CM

## 2014-03-07 DIAGNOSIS — Z7982 Long term (current) use of aspirin: Secondary | ICD-10-CM | POA: Diagnosis not present

## 2014-03-07 DIAGNOSIS — I252 Old myocardial infarction: Secondary | ICD-10-CM

## 2014-03-07 DIAGNOSIS — G8929 Other chronic pain: Secondary | ICD-10-CM | POA: Diagnosis present

## 2014-03-07 DIAGNOSIS — I69998 Other sequelae following unspecified cerebrovascular disease: Secondary | ICD-10-CM

## 2014-03-07 DIAGNOSIS — E119 Type 2 diabetes mellitus without complications: Secondary | ICD-10-CM | POA: Diagnosis not present

## 2014-03-07 DIAGNOSIS — E785 Hyperlipidemia, unspecified: Secondary | ICD-10-CM | POA: Diagnosis not present

## 2014-03-07 DIAGNOSIS — Z79899 Other long term (current) drug therapy: Secondary | ICD-10-CM

## 2014-03-07 DIAGNOSIS — I10 Essential (primary) hypertension: Secondary | ICD-10-CM | POA: Diagnosis present

## 2014-03-07 DIAGNOSIS — I5030 Unspecified diastolic (congestive) heart failure: Secondary | ICD-10-CM | POA: Diagnosis not present

## 2014-03-07 DIAGNOSIS — R918 Other nonspecific abnormal finding of lung field: Secondary | ICD-10-CM | POA: Diagnosis not present

## 2014-03-07 DIAGNOSIS — Z91199 Patient's noncompliance with other medical treatment and regimen due to unspecified reason: Secondary | ICD-10-CM | POA: Diagnosis not present

## 2014-03-07 DIAGNOSIS — I509 Heart failure, unspecified: Secondary | ICD-10-CM | POA: Diagnosis present

## 2014-03-07 DIAGNOSIS — M545 Low back pain, unspecified: Secondary | ICD-10-CM | POA: Diagnosis present

## 2014-03-07 DIAGNOSIS — Z8673 Personal history of transient ischemic attack (TIA), and cerebral infarction without residual deficits: Secondary | ICD-10-CM | POA: Diagnosis present

## 2014-03-07 DIAGNOSIS — Z87891 Personal history of nicotine dependence: Secondary | ICD-10-CM | POA: Diagnosis not present

## 2014-03-07 DIAGNOSIS — K551 Chronic vascular disorders of intestine: Secondary | ICD-10-CM | POA: Diagnosis present

## 2014-03-07 DIAGNOSIS — I7102 Dissection of abdominal aorta: Secondary | ICD-10-CM | POA: Diagnosis not present

## 2014-03-07 DIAGNOSIS — Z7901 Long term (current) use of anticoagulants: Secondary | ICD-10-CM

## 2014-03-07 DIAGNOSIS — I498 Other specified cardiac arrhythmias: Secondary | ICD-10-CM | POA: Diagnosis present

## 2014-03-07 DIAGNOSIS — J9819 Other pulmonary collapse: Secondary | ICD-10-CM | POA: Diagnosis not present

## 2014-03-07 DIAGNOSIS — E118 Type 2 diabetes mellitus with unspecified complications: Secondary | ICD-10-CM | POA: Diagnosis present

## 2014-03-07 DIAGNOSIS — I7779 Dissection of other artery: Secondary | ICD-10-CM | POA: Diagnosis present

## 2014-03-07 DIAGNOSIS — R001 Bradycardia, unspecified: Secondary | ICD-10-CM | POA: Diagnosis present

## 2014-03-07 DIAGNOSIS — Z758 Other problems related to medical facilities and other health care: Secondary | ICD-10-CM

## 2014-03-07 DIAGNOSIS — Z789 Other specified health status: Secondary | ICD-10-CM

## 2014-03-07 HISTORY — DX: Atherosclerotic heart disease of native coronary artery without angina pectoris: I25.10

## 2014-03-07 LAB — COMPREHENSIVE METABOLIC PANEL
ALT: 22 U/L (ref 0–53)
AST: 19 U/L (ref 0–37)
Albumin: 3.2 g/dL — ABNORMAL LOW (ref 3.5–5.2)
Alkaline Phosphatase: 90 U/L (ref 39–117)
BUN: 14 mg/dL (ref 6–23)
CO2: 26 mEq/L (ref 19–32)
Calcium: 9.1 mg/dL (ref 8.4–10.5)
Chloride: 98 mEq/L (ref 96–112)
Creatinine, Ser: 1.19 mg/dL (ref 0.50–1.35)
GFR calc Af Amer: 73 mL/min — ABNORMAL LOW (ref >90)
GFR calc non Af Amer: 63 mL/min — ABNORMAL LOW (ref >90)
Glucose, Bld: 135 mg/dL — ABNORMAL HIGH (ref 70–99)
Potassium: 4.4 mEq/L (ref 3.7–5.3)
Sodium: 138 mEq/L (ref 137–147)
Total Bilirubin: 1 mg/dL (ref 0.3–1.2)
Total Protein: 7 g/dL (ref 6.0–8.3)

## 2014-03-07 LAB — PRO B NATRIURETIC PEPTIDE: Pro B Natriuretic peptide (BNP): 420.7 pg/mL — ABNORMAL HIGH (ref 0–125)

## 2014-03-07 LAB — POCT I-STAT TROPONIN I: Troponin i, poc: 0.03 ng/mL (ref 0.00–0.08)

## 2014-03-07 LAB — CBC
HCT: 42.2 % (ref 39.0–52.0)
Hemoglobin: 13.8 g/dL (ref 13.0–17.0)
MCH: 28.7 pg (ref 26.0–34.0)
MCHC: 32.7 g/dL (ref 30.0–36.0)
MCV: 87.7 fL (ref 78.0–100.0)
Platelets: 186 10*3/uL (ref 150–400)
RBC: 4.81 MIL/uL (ref 4.22–5.81)
RDW: 12.7 % (ref 11.5–15.5)
WBC: 13.5 10*3/uL — ABNORMAL HIGH (ref 4.0–10.5)

## 2014-03-07 LAB — I-STAT TROPONIN, ED: Troponin i, poc: 0.03 ng/mL (ref 0.00–0.08)

## 2014-03-07 MED ORDER — ASPIRIN 81 MG PO CHEW: 324.0000 mg | CHEWABLE_TABLET | Freq: Once | ORAL | Status: DC

## 2014-03-07 MED ORDER — METOPROLOL TARTRATE 12.5 MG HALF TABLET
12.5000 mg | ORAL_TABLET | Freq: Two times a day (BID) | ORAL | Status: DC
  Administered 2014-03-07 – 2014-03-11 (×8): 12.5 mg via ORAL
  Filled 2014-03-07 (×9): qty 1

## 2014-03-07 MED ORDER — ATORVASTATIN CALCIUM 80 MG PO TABS
80.0000 mg | ORAL_TABLET | Freq: Every day | ORAL | Status: DC
  Administered 2014-03-08 – 2014-03-10 (×3): 80 mg via ORAL
  Filled 2014-03-07 (×4): qty 1

## 2014-03-07 MED ORDER — IOHEXOL 350 MG/ML SOLN
100.0000 mL | Freq: Once | INTRAVENOUS | Status: AC | PRN
  Administered 2014-03-07: 100 mL via INTRAVENOUS

## 2014-03-07 MED ORDER — NITROGLYCERIN 0.4 MG SL SUBL: 0.4000 mg | SUBLINGUAL_TABLET | SUBLINGUAL | Status: DC | PRN

## 2014-03-07 MED ORDER — HEPARIN BOLUS VIA INFUSION
4000.0000 [IU] | Freq: Once | INTRAVENOUS | Status: AC
  Filled 2014-03-07: qty 4000

## 2014-03-07 MED ORDER — CLOPIDOGREL BISULFATE 75 MG PO TABS
75.0000 mg | ORAL_TABLET | Freq: Every day | ORAL | Status: DC
  Administered 2014-03-08 – 2014-03-10 (×3): 75 mg via ORAL
  Filled 2014-03-07 (×4): qty 1

## 2014-03-07 MED ORDER — ASPIRIN EC 81 MG PO TBEC
81.0000 mg | DELAYED_RELEASE_TABLET | Freq: Every day | ORAL | Status: DC
  Administered 2014-03-08 – 2014-03-09 (×2): 81 mg via ORAL
  Filled 2014-03-07 (×3): qty 1

## 2014-03-07 MED ORDER — HEPARIN (PORCINE) IN NACL 100-0.45 UNIT/ML-% IJ SOLN
1200.0000 [IU]/h | INTRAMUSCULAR | Status: DC
  Administered 2014-03-07: 4000 [IU]/h via INTRAVENOUS
  Administered 2014-03-07: 900 [IU]/h via INTRAVENOUS
  Administered 2014-03-08: 1300 [IU]/h via INTRAVENOUS
  Administered 2014-03-08: 1250 [IU]/h via INTRAVENOUS
  Administered 2014-03-09: 1300 [IU]/h via INTRAVENOUS
  Administered 2014-03-09: 1450 [IU]/h via INTRAVENOUS
  Administered 2014-03-10: 1300 [IU]/h via INTRAVENOUS
  Filled 2014-03-07 (×6): qty 250

## 2014-03-07 NOTE — Progress Notes (Signed)
ANTICOAGULATION CONSULT NOTE - Initial Consult  Pharmacy Consult for Heparin Indication: chest pain/ACS  No Known Allergies  Patient Measurements: Weight 73.5 kg on 02/28/14 Height 170 cm on 02/26/14 IBW 66 kg Heparin Dosing Weight: 73.5 kg  Vital Signs: Temp: 99.9 F (37.7 C) (06/12 1648) Temp src: Oral (06/12 1648) BP: 117/67 mmHg (06/12 2115) Pulse Rate: 70 (06/12 2115)  Labs:  Recent Labs  03/07/14 1715  HGB 13.8  HCT 42.2  PLT 186  CREATININE 1.19    The CrCl is unknown because both a height and weight (above a minimum accepted value) are required for this calculation.   Medical History: Past Medical History  Diagnosis Date  . Hypertension   . Diabetes mellitus   . Stroke 09/26/2009    Syncope.  [facility]  admission.  [place] Kitchen AAA (abdominal aortic aneurysm)   . Dissection of mesenteric artery     Medications:  Not on anticoagulation PTA  Assessment: 58 YOM s/p recent admission for NSTEMI back in ED with chest pain to start IV heparin per pharmacy dosing. H/H within normal limits. Platelets 186. SCr 1.19.  Goal of Therapy:  Heparin level 0.3-0.7 units/ml Monitor platelets by anticoagulation protocol: Yes   Plan:  1. Heparin 4000 units x1 then heparin drip at 900 units/hr. 2. Heparin level in 6 hours ( ok with AM labs ) 3. Daily heparin level and CBC while on therapy.   Sloan Leiter, PharmD, BCPS Clinical Pharmacist [phone] 03/07/2014,9:42 PM

## 2014-03-07 NOTE — ED Provider Notes (Signed)
CSN: [id number]     Arrival date & time 03/07/14  1633 History   First MD Initiated Contact with Patient 03/07/14 1643     Chief Complaint  Patient presents with  . Chest Pain  . Loss of Consciousness     (Consider location/radiation/quality/duration/timing/severity/associated sxs/prior Treatment) Patient is a 63 y.o. male presenting with syncope.  Loss of Consciousness Episode history:  Single Most recent episode:  Today Duration:  5 seconds Timing:  Rare Progression:  Resolved Chronicity:  New Context: not dehydration, not inactivity, not with normal activity, not sitting down and not urination   Witnessed: yes   Ineffective treatments:  None tried Associated symptoms: chest pain, diaphoresis, nausea, shortness of breath and vomiting   Associated symptoms: no dizziness, no fever, no headaches and no palpitations     Past Medical History  Diagnosis Date  . Hypertension   . Diabetes mellitus   . Stroke 09/26/2009    Syncope.  [place]  admission.  [facility] AAA (abdominal aortic aneurysm)   . Dissection of mesenteric artery    Past Surgical History  Procedure Laterality Date  . Admission  09/26/2009    CVA.  [name].  . Cardiac catheterization    . Coronary angioplasty     Family History  Problem Relation Age of Onset  . Diabetes Father   . Hypertension Father   . Heart attack Father    History  Substance Use Topics  . Smoking status: Former Smoker    Quit date: 06/09/2009  . Smokeless tobacco: Never Used  . Alcohol Use: No    Review of Systems  Constitutional: Positive for diaphoresis. Negative for fever and chills.  HENT: Negative for congestion and rhinorrhea.   Eyes: Negative for pain.  Respiratory: Positive for shortness of breath. Negative for cough.   Cardiovascular: Positive for chest pain and syncope. Negative for palpitations.  Gastrointestinal: Positive for nausea and vomiting. Negative for abdominal pain, diarrhea and constipation.  Endocrine:  Negative for polydipsia and polyuria.  Genitourinary: Negative for dysuria and flank pain.  Musculoskeletal: Negative for back pain and neck pain.  Skin: Negative for color change and wound.  Neurological: Positive for syncope. Negative for dizziness, numbness and headaches.      Allergies  Review of patient's allergies indicates no known allergies.  Home Medications   Prior to Admission medications   Medication Sig Start Date End Date Taking? Authorizing Provider  aspirin EC 81 MG EC tablet Take 1 tablet (81 mg total) by mouth daily. 02/28/14  Yes Erlene Quan, PA-C  atorvastatin (LIPITOR) 80 MG tablet Take 1 tablet (80 mg total) by mouth daily at 6 PM. 02/28/14  Yes Erlene Quan, PA-C  clopidogrel (PLAVIX) 75 MG tablet Take 1 tablet (75 mg total) by mouth daily with breakfast. 02/28/14  Yes Erlene Quan, PA-C  diphenhydrAMINE (BENADRYL) 25 MG tablet Take 50 mg by mouth daily as needed for allergies.   Yes Historical Provider, MD  metFORMIN (GLUCOPHAGE) 500 MG tablet Take 1 tablet (500 mg total) by mouth 2 (two) times daily with a meal. 02/28/14  Yes Doreene Burke Kilroy, PA-C  metoprolol tartrate (LOPRESSOR) 25 MG tablet Take 12.5 mg by mouth 2 (two) times daily.   Yes Historical Provider, MD  nitroGLYCERIN (NITROSTAT) 0.4 MG SL tablet Place 1 tablet (0.4 mg total) under the tongue every 5 (five) minutes as needed for chest pain. 02/28/14  Yes Luke K Kilroy, PA-C  OVER THE COUNTER MEDICATION Take 1 tablet by  mouth daily. Natural E Complete from [employer] - contains Vitamin E 30 units and selenium 10 mcg   Yes Historical Provider, MD   BP 117/68  Pulse 64  Temp(Src) 98.1 F (36.7 C) (Oral)  Resp 18  Ht 5\' 5"  (1.651 m)  Wt 159 lb 9.6 oz (72.394 kg)  BMI 26.56 kg/m2  SpO2 96% Physical Exam  Nursing note and vitals reviewed. Constitutional: He is oriented to person, place, and time. He appears well-developed and well-nourished.  HENT:  Head: Normocephalic and atraumatic.  Eyes: Conjunctivae  and EOM are normal. Pupils are equal, round, and reactive to light.  Neck: Normal range of motion.  Cardiovascular: Normal rate and regular rhythm.   Pulmonary/Chest: Effort normal and breath sounds normal.  Abdominal: Soft. He exhibits no distension. There is no tenderness.  Musculoskeletal: Normal range of motion. He exhibits no edema and no tenderness.  Neurological: He is alert and oriented to person, place, and time.  Skin: Skin is warm and dry.    ED Course  Procedures (including critical care time) Labs Review Labs Reviewed  CBC - Abnormal; Notable for the following:    WBC 13.5 (*)    All other components within normal limits  COMPREHENSIVE METABOLIC PANEL - Abnormal; Notable for the following:    Glucose, Bld 135 (*)    Albumin 3.2 (*)    GFR calc non Af Amer 63 (*)    GFR calc Af Amer 73 (*)    All other components within normal limits  PRO B NATRIURETIC PEPTIDE - Abnormal; Notable for the following:    Pro B Natriuretic peptide (BNP) 420.7 (*)    All other components within normal limits  HEPARIN LEVEL (UNFRACTIONATED)  CBC  LIPID PANEL  BASIC METABOLIC PANEL  PROTIME-INR  I-STAT TROPOININ, ED  POCT I-STAT TROPONIN I    Imaging Review Ct Angio Chest Pe W/cm &/or Wo Cm  03/07/2014   CLINICAL DATA:  Chest pain and syncope.  EXAM: CT ANGIOGRAPHY CHEST WITH CONTRAST  TECHNIQUE: Multidetector CT imaging of the chest was performed using the standard protocol during bolus administration of intravenous contrast. Multiplanar CT image reconstructions and MIPs were obtained to evaluate the vascular anatomy.  CONTRAST:  130mL OMNIPAQUE IOHEXOL 350 MG/ML SOLN  COMPARISON:  CT 02/26/2014.  FINDINGS: Thoracic aorta e ectasia and atherosclerotic. No dissection noted. Intimal thickening and/or thrombus noted within the lumen along the wall of the thoracic aorta is stable and unchanged. Pulmonary arteries are normal. No pulmonary embolus. Cardiomegaly. Coronary artery disease.  Shotty  stable mediastinal lymph nodes are noted . Small hilar lymph nodes are unchanged. Thoracic esophagus nondistended.  Large airways patent. Mild basilar atelectasis present. No focal alveolar infiltrates. No pleural effusion or pneumothorax.  Visualized upper abdominal viscera unremarkable.  Visualized thyroid unremarkable. Multiple nonspecific axillary lymph nodes are present. Chest wall is intact. Diffuse degenerative changes thoracic spine. No acute bony abnormality identified.  Review of the MIP images confirms the above findings.  IMPRESSION: 1.  No evidence of pulmonary embolus.  2. Cardiomegaly.  Coronary artery disease.   Electronically Signed   By: Marcello Moores  Register   On: 03/07/2014 19:06   Dg Chest Portable 1 View  03/07/2014   CLINICAL DATA:  Syncope.  Chest pain.  EXAM: PORTABLE CHEST - 1 VIEW  COMPARISON:  02/26/2014  FINDINGS: Low lung volumes. Heart size is accentuated by the low volumes an AP portable nature of the study. No confluent opacities or effusions. No acute bony abnormality.  IMPRESSION: Low lung volumes.  No active disease.   Electronically Signed   By: Rolm Baptise M.D.   On: 03/07/2014 18:57     EKG Interpretation   Date/Time:  Friday March 07 2014 16:39:10 EDT Ventricular Rate:  95 PR Interval:  156 QRS Duration: 111 QT Interval:  369 QTC Calculation: 464 R Axis:   37 Text Interpretation:  Sinus rhythm Probable left atrial enlargement new  TWI in lead III Confirmed by DOCHERTY  MD, MEGAN ([id number]) on 03/07/2014  5:18:36 PM      MDM   Final diagnoses:  None    63 yo M w/ h/o CAD s/p stent (most recently Wednesday) here with chest pain w/ diaphoresis followed by vomiting and syncope w/ sob. Here patient stable, cp resolved after ntg w/ ems. ASA already administered. Appears well. Exam as above. Concern for ACS/in stent thrombosis. CTA PE study negative. Possibly GI related however with significant history and recent stenting, will consult cardiology for admission.      Merrily Pew, MD 03/08/14 0001

## 2014-03-07 NOTE — ED Notes (Signed)
Clarified ASA orders with Dr. Tommi Rumps, and no additional orders for PTT received prior to starting heparin drip. Pharmacy has already scheduled heparin level in the morning.

## 2014-03-07 NOTE — ED Notes (Signed)
Pt arrived from home by [facility]. Pt ate some food and then stated that he was going to the bathroom because he felt like he was going to vomit. Pt vomited and after he vomited had a syncopal episode. 57mins prior to syncopal episode pt had central CP along with some diaphoresis. EMS administered ASA 324mg  and Nitro x1 that relieved CP. Denies any pain at this time. Pt was recently admitted for NSTEMI and had a stent placed Wednesday. Follow up appointment is schedule for Monday. BP-115/77 Hr-96 Initial O2sat on RA was between 92-94%, 2LPM applied and increased O2sats 97%

## 2014-03-07 NOTE — ED Notes (Signed)
Attempted to call report

## 2014-03-07 NOTE — ED Notes (Signed)
Patient is from [place]

## 2014-03-07 NOTE — H&P (Signed)
Patient ID: Barry [name] MRN: [id number], DOB/AGE: [birth year]   Admit date: 03/07/2014   Primary Physician: No primary provider on file. Primary Cardiologist: Dr Aundra Dubin (new)   Pt. Profile:  63 y/o [place] male former smoker with a past medical history significant for HTN, DM, stroke in 2011 with residual Lt arm weakness, AAA and mesenteric ischemia with h/o mesenteric artery dissection with recent PCI to RCA for NSTEMI (DES, 02/26/14) who presents with chest pain and syncope.   Problem List  Past Medical History  Diagnosis Date  . Hypertension   . Diabetes mellitus   . Stroke 09/26/2009    Syncope.  [place]  admission.  [facility]land Kitchen AAA (abdominal aortic aneurysm)   . Dissection of mesenteric artery     Past Surgical History  Procedure Laterality Date  . Admission  09/26/2009    CVA.  [name].     Allergies  No Known Allergies  HPI  63 y/o [place] male former smoker with a past medical history significant for HTN, DM, stroke in 2011 with residual Lt arm weakness, AAA and mesenteric ischemia with h/o mesenteric artery dissection with recent PCI to RCA for NSTEMI (DES, 02/26/14) who presents with chest pain and syncope.   Barry [name] was recently hospitalized from 6/3-6/5 for an NSTEMI. He had an RCA DES placed 02/26/14 for single vessel disease. His EF was 45% by echo. Discharged on ASA, atorvastatin, clopidogrel 75mg , lisinoprol, and metoprolol tartrate 12.5mg  BID.   Today, he developed developed left sided CP with nausea and vomiting after eating some prepared food. Patient had LOC after emesis. Noted to be diaphoretic by son and EMS was called.  LOC was brief and he awoke with chest pain.  Given nitro by EMS which caused CP to abate.   On interview with patient (via family members), Barry [name] has apparently been experiencing low levels of chest pain nearly continuously since discharge. He has taken SL NTG 3-4 times over the last week when the pain has become particularly severe and this  has helped with the pain. Son reports that Barry [name] has been taking all medications as prescribed including clopidogrel.   In ED, VSS and patient was CP free. ECG without acute evidence of ischemia. He was sent for a CTA which did not demonstrate PE. Initial troponin (-). Cardiology consulted for admission.    Home Medications  Prior to Admission medications   Medication Sig Start Date End Date Taking? Authorizing Provider  aspirin EC 81 MG EC tablet Take 1 tablet (81 mg total) by mouth daily. 02/28/14  Yes Erlene Quan, PA-C  atorvastatin (LIPITOR) 80 MG tablet Take 1 tablet (80 mg total) by mouth daily at 6 PM. 02/28/14  Yes Erlene Quan, PA-C  clopidogrel (PLAVIX) 75 MG tablet Take 1 tablet (75 mg total) by mouth daily with breakfast. 02/28/14  Yes Erlene Quan, PA-C  diphenhydrAMINE (BENADRYL) 25 MG tablet Take 50 mg by mouth daily as needed for allergies.   Yes Historical Provider, MD  metFORMIN (GLUCOPHAGE) 500 MG tablet Take 1 tablet (500 mg total) by mouth 2 (two) times daily with a meal. 02/28/14  Yes Doreene Burke Kilroy, PA-C  metoprolol tartrate (LOPRESSOR) 25 MG tablet Take 12.5 mg by mouth 2 (two) times daily.   Yes Historical Provider, MD  nitroGLYCERIN (NITROSTAT) 0.4 MG SL tablet Place 1 tablet (0.4 mg total) under the tongue every 5 (five) minutes as needed for chest pain. 02/28/14  Yes Erlene Quan, PA-C  OVER THE COUNTER MEDICATION Take 1 tablet by mouth daily. Natural E Complete from Nutrilite - contains Vitamin E 30 units and selenium 10 mcg   Yes Historical Provider, MD    Family History  Family History  Problem Relation Age of Onset  . Diabetes Father   . Hypertension Father   . Heart attack Father     Social History  History   Social History  . Marital Status: Single    Spouse Name: N/A    Number of Children: N/A  . Years of Education: N/A   Occupational History  . Not on file.   Social History Main Topics  . Smoking status: Former Smoker    Quit date: 06/09/2009    . Smokeless tobacco: Never Used  . Alcohol Use: No  . Drug Use: No  . Sexual Activity: Not Currently   Other Topics Concern  . Not on file   Social History Narrative   Marital: married.      Lives: with son,wife.       Children: 3 children; 6 grandchildren      Employed: unemployed; disability unknown reason/CVA L sided weakness      Tobacco:  Quit 2012; smoked 40 years.       Alcohol: no drinking now; social in past.       Drugs: none       Exercise: sporadic.       ADLs:  No driving since CVA.     Review of Systems General:  No chills, fever Cardiovascular:  See HPI Respiratory: No cough, dyspnea Urologic: No hematuria, dysuria Abdominal:    + nausea, + vomiting, - diarrhea, - bright red blood per rectum, - melena, or - hematemesis Neurologic:  No visual changes, wkns, changes in mental status. All other systems reviewed and are otherwise negative except as noted above.  Physical Exam  Blood pressure 115/57, pulse 65, temperature 99.9 F (37.7 C), temperature source Oral, resp. rate 12, SpO2 96.00%.  General: Sleepy, NAD Psych: Normal affect. Neuro: Alert and oriented X 3. Moves all extremities spontaneously. HEENT: Normal  Neck: Supple without bruits or JVD. Lungs:  Resp regular and unlabored, CTA. Heart: RRR no s3, s4, or murmurs. Abdomen: Soft, non-tender, non-distended, BS + x 4.  Extremities: No clubbing, cyanosis or edema. DP/PT/Radials 2+ and equal bilaterally. Small <1cm hematoma noted over RRA.   Labs  Troponin (Point of Care Test)  Recent Labs  03/07/14 1724  TROPIPOC 0.03   No results found for this basename: CKTOTAL, CKMB, TROPONINI,  in the last 72 hours Lab Results  Component Value Date   WBC 13.5* 03/07/2014   HGB 13.8 03/07/2014   HCT 42.2 03/07/2014   MCV 87.7 03/07/2014   PLT 186 03/07/2014    Recent Labs Lab 03/07/14 1715  NA 138  K 4.4  CL 98  CO2 26  BUN 14  CREATININE 1.19  CALCIUM 9.1  PROT 7.0  BILITOT 1.0  ALKPHOS 90   ALT 22  AST 19  GLUCOSE 135*   Lab Results  Component Value Date   CHOL 201* 02/27/2014   HDL 35* 02/27/2014   LDLCALC 136* 02/27/2014   TRIG 150* 02/27/2014   No results found for this basename: DDIMER     Radiology/Studies  Ct Head Wo Contrast  02/26/2014   CLINICAL DATA:  Chest pain.  Leg pain.  EXAM: CT HEAD WITHOUT CONTRAST  TECHNIQUE: Contiguous axial images were obtained from the base of the skull through the  vertex without intravenous contrast.  COMPARISON:  CT head without contrast 09/30/2009  FINDINGS: No acute cortical infarct, hemorrhage, or mass lesion is present. The ventricles are of normal size. No significant extra-axial fluid collection is evident. The paranasal sinuses and mastoid air cells are clear. The osseous skull is intact.  IMPRESSION: Negative CT of the head.   Electronically Signed   By: Lawrence Santiago M.D.   On: 02/26/2014 14:15   Ct Angio Chest Pe W/cm &/or Wo Cm  03/07/2014   CLINICAL DATA:  Chest pain and syncope.  EXAM: CT ANGIOGRAPHY CHEST WITH CONTRAST  TECHNIQUE: Multidetector CT imaging of the chest was performed using the standard protocol during bolus administration of intravenous contrast. Multiplanar CT image reconstructions and MIPs were obtained to evaluate the vascular anatomy.  CONTRAST:  19mL OMNIPAQUE IOHEXOL 350 MG/ML SOLN  COMPARISON:  CT 02/26/2014.  FINDINGS: Thoracic aorta e ectasia and atherosclerotic. No dissection noted. Intimal thickening and/or thrombus noted within the lumen along the wall of the thoracic aorta is stable and unchanged. Pulmonary arteries are normal. No pulmonary embolus. Cardiomegaly. Coronary artery disease.  Shotty stable mediastinal lymph nodes are noted . Small hilar lymph nodes are unchanged. Thoracic esophagus nondistended.  Large airways patent. Mild basilar atelectasis present. No focal alveolar infiltrates. No pleural effusion or pneumothorax.  Visualized upper abdominal viscera unremarkable.  Visualized thyroid  unremarkable. Multiple nonspecific axillary lymph nodes are present. Chest wall is intact. Diffuse degenerative changes thoracic spine. No acute bony abnormality identified.  Review of the MIP images confirms the above findings.  IMPRESSION: 1.  No evidence of pulmonary embolus.  2. Cardiomegaly.  Coronary artery disease.   Electronically Signed   By: Marcello Moores  Register   On: 03/07/2014 19:06   Dg Chest Portable 1 View  03/07/2014   CLINICAL DATA:  Syncope.  Chest pain.  EXAM: PORTABLE CHEST - 1 VIEW  COMPARISON:  02/26/2014  FINDINGS: Low lung volumes. Heart size is accentuated by the low volumes an AP portable nature of the study. No confluent opacities or effusions. No acute bony abnormality.  IMPRESSION: Low lung volumes.  No active disease.   Electronically Signed   By: Rolm Baptise M.D.   On: 03/07/2014 18:57   Dg Chest Portable 1 View  02/26/2014   CLINICAL DATA:  Chest pain.  EXAM: PORTABLE CHEST - 1 VIEW  COMPARISON:  None.  FINDINGS: Heart size is exaggerated by low lung volumes. Pulmonary vascular congestion is evident. No focal airspace disease is present.  IMPRESSION: 1. Low lung volumes and mild pulmonary vascular congestion. 2. No focal airspace disease.   Electronically Signed   By: Lawrence Santiago M.D.   On: 02/26/2014 13:30   Ct Angio Chest Aortic Dissect W &/or W/o  02/26/2014   CLINICAL DATA:  h/o dissection, mid and left-sided chest pain, hypertensive  EXAM: CT ANGIOGRAPHY CHEST, ABDOMEN AND PELVIS  TECHNIQUE: Multidetector CT imaging through the chest, abdomen and pelvis was performed using the standard protocol during bolus administration of intravenous contrast. Multiplanar reconstructed images including MIPs were obtained and reviewed to evaluate the vascular anatomy.  CONTRAST:  178mL OMNIPAQUE IOHEXOL 350 MG/ML SOLN  COMPARISON:  12/03/2012  FINDINGS: CTA CHEST FINDINGS  The noncontrast scout shows no hyperdense crescent, mediastinal hematoma, pleural or pericardial effusion. Patchy  coronary calcifications and aortic arch calcifications are noted.  CTA reveals no dissection or stenosis. Classic 3 vessel brachiocephalic arterial origin anatomy without proximal lesion. There is mild plaque in the distal arch and descending  segment. . No significant intramural thrombus. Satisfactory opacification of pulmonary arteries noted, and there is no evidence of pulmonary emboli.  Dependent atelectasis posteriorly in both lungs. No confluent airspace consolidation.  Review of the MIP images confirms the above findings. Thoracic spine and sternum unremarkable.  CTA ABDOMEN AND PELVIS FINDINGS  Arterial findings:  Aorta: Mild scattered eccentric plaque. There is a focal ulceration just proximal to the bifurcation. No aneurysm, dissection, or stenosis.  Celiac axis: Short segment high-grade stenosis just beyond the origin at the level of the median arcuate ligament of the diaphragm, patent distally.  Superior mesenteric: Dissection in the proximal SMA, the false lumen thrombosed for length of 3.3 cm from its origin, perfused distally, extending over a length of approximately 4 cm, with some aneurysmal dilatation up to 13 mm diameter. No extension into branch vessels. Short segment extension into the middle colic branch as before. Stable appearance from prior study.  Left renal:          Single, patent.  Right renal:         Single, patent.  Inferior mesenteric: Short segment origin stenosis related to aortic wall plaque, patent distally.  Left iliac: Ectatic, 17 mm diameter, with some eccentric plaque in the common iliac. There is mild narrowing in the proximal internal iliac artery, patent distally. The external iliac is mildly tortuous, otherwise unremarkable.  Right iliac: Mild nonocclusive plaque through the common iliac and in the proximal internal iliac. External iliac widely patent, mildly tortuous.  Venous findings:     Dedicated venous phase imaging not obtained.  Review of the MIP images confirms the  above findings.  Nonvascular findings: Unremarkable arterial phase evaluation of liver, spleen, adrenal glands, pancreas. Gallbladder is incompletely distended. Tiny bilateral renal cysts. Stomach distended by ingested material. Small bowel and colon are nondilated. Appendix not identified. No ascites. No free air. No adenopathy. Mild prostatic enlargement. Regional bones unremarkable.  IMPRESSION: 1. No aortic  dissection or other acute abnormality. 2. 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 3. Stable 17 mm ectasia of the left common iliac artery. 4. Atherosclerosis, including aortoiliac and coronary artery disease. Please note that although the presence of coronary artery calcium documents the presence of coronary artery disease, the severity of this disease and any potential stenosis cannot be assessed on this non-gated CT examination. Assessment for potential risk factor modification, dietary therapy or pharmacologic therapy may be warranted, if clinically indicated.   Electronically Signed   By: Arne Cleveland M.D.   On: 02/26/2014 14:37   Ct Angio Abd/pel W/ And/or W/o  02/26/2014   CLINICAL DATA:  h/o dissection, mid and left-sided chest pain, hypertensive  EXAM: CT ANGIOGRAPHY CHEST, ABDOMEN AND PELVIS  TECHNIQUE: Multidetector CT imaging through the chest, abdomen and pelvis was performed using the standard protocol during bolus administration of intravenous contrast. Multiplanar reconstructed images including MIPs were obtained and reviewed to evaluate the vascular anatomy.  CONTRAST:  182mL OMNIPAQUE IOHEXOL 350 MG/ML SOLN  COMPARISON:  12/03/2012  FINDINGS: CTA CHEST FINDINGS  The noncontrast scout shows no hyperdense crescent, mediastinal hematoma, pleural or pericardial effusion. Patchy coronary calcifications and aortic arch calcifications are noted.  CTA reveals no dissection or stenosis. Classic 3 vessel brachiocephalic arterial origin anatomy without proximal lesion. There is mild  plaque in the distal arch and descending segment. . No significant intramural thrombus. Satisfactory opacification of pulmonary arteries noted, and there is no evidence of pulmonary emboli.  Dependent atelectasis posteriorly in both lungs. No confluent  airspace consolidation.  Review of the MIP images confirms the above findings. Thoracic spine and sternum unremarkable.  CTA ABDOMEN AND PELVIS FINDINGS  Arterial findings:  Aorta: Mild scattered eccentric plaque. There is a focal ulceration just proximal to the bifurcation. No aneurysm, dissection, or stenosis.  Celiac axis: Short segment high-grade stenosis just beyond the origin at the level of the median arcuate ligament of the diaphragm, patent distally.  Superior mesenteric: Dissection in the proximal SMA, the false lumen thrombosed for length of 3.3 cm from its origin, perfused distally, extending over a length of approximately 4 cm, with some aneurysmal dilatation up to 13 mm diameter. No extension into branch vessels. Short segment extension into the middle colic branch as before. Stable appearance from prior study.  Left renal:          Single, patent.  Right renal:         Single, patent.  Inferior mesenteric: Short segment origin stenosis related to aortic wall plaque, patent distally.  Left iliac: Ectatic, 17 mm diameter, with some eccentric plaque in the common iliac. There is mild narrowing in the proximal internal iliac artery, patent distally. The external iliac is mildly tortuous, otherwise unremarkable.  Right iliac: Mild nonocclusive plaque through the common iliac and in the proximal internal iliac. External iliac widely patent, mildly tortuous.  Venous findings:     Dedicated venous phase imaging not obtained.  Review of the MIP images confirms the above findings.  Nonvascular findings: Unremarkable arterial phase evaluation of liver, spleen, adrenal glands, pancreas. Gallbladder is incompletely distended. Tiny bilateral renal cysts. Stomach  distended by ingested material. Small bowel and colon are nondilated. Appendix not identified. No ascites. No free air. No adenopathy. Mild prostatic enlargement. Regional bones unremarkable.  IMPRESSION: 1. No aortic  dissection or other acute abnormality. 2. 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 3. Stable 17 mm ectasia of the left common iliac artery. 4. Atherosclerosis, including aortoiliac and coronary artery disease. Please note that although the presence of coronary artery calcium documents the presence of coronary artery disease, the severity of this disease and any potential stenosis cannot be assessed on this non-gated CT examination. Assessment for potential risk factor modification, dietary therapy or pharmacologic therapy may be warranted, if clinically indicated.   Electronically Signed   By: Arne Cleveland M.D.   On: 02/26/2014 14:37   02/26/14 Cath  Procedure: Selective Coronary Angiography, PTCA and drug eluting stenting of the right coronary artery  Indication: NSTEMI  Medications:  Sedation: 2 mg IV Versed, 25 mcg IV Fentanyl  Contrast: 175 ml Omnipaque  Procedural Details: The right wrist was prepped, draped, and anesthetized with 1% lidocaine. Using the modified Seldinger technique, a 5 French Slender sheath was introduced into the right radial artery. 3 mg of verapamil was administered through the sheath, weight-based unfractionated heparin was administered intravenously. A Jackie catheter was used for selective coronary angiography. There was severe tortuosity involving the right innominate artery with extreme difficulty in talking the catheter was. The left main coronary artery could not be selectively engaged in spite of using multiple catheters. I was able to perform nonselective angiography with an AL-1 catheter. The aortic valve could not be crossed for the same reason. Catheter exchanges were performed over an exchange length guidewire. There were no immediate  procedural complications.  Procedural Findings:  Hemodynamics:  AO: 143/79 mmHg  Coronary angiography:  Coronary dominance: Right  Left Main: Normal  Left Anterior Descending (LAD): Normal in size with 20%  mid stenosis. The rest of the vessel has minor irregularities. Faint left-to-right collaterals were noted. He and  1st diagonal (D1): Normal in size with no significant disease.  2nd diagonal (D2): Medium in size with minor irregularities.  3rd diagonal (D3): Medium in size witt minor irregularities.  Circumflex (LCx): Normal in size and nondominant. There is a 50% mid stenosis with no other significant disease.  1st obtuse marginal: Medium in size with minor irregularities.  2nd obtuse marginal: Normal in size with no significant disease.  3rd obtuse marginal: Normal in size with minor irregularities.  Right Coronary Artery: Very large in size and dominant. There is a complex 95% tubular proximal stenosis with plaque rupture and moderate amount of thrombus. The rest of the vessel has minor irregularities.  Posterior descending artery: Large in size with minor irregularities.  Posterior AV segment: Normal in size with minor irregularities.  Posterolateral branchs: Minor irregularities with no significant disease. Left ventriculography: Was not performed. The aortic valve could not be crossed.  PCI Note: Following the diagnostic procedure, the decision was made to proceed with PCI. Weight-based bivalirudin was given for anticoagulation. Once a therapeutic ACT was achieved, a 6 [place] JR 4 guide catheter was inserted. A fielder XT coronary guidewire was used to cross the lesion. There was significant difficulty in crossing the lesion. I used multiple wires including the Runthrough, intuition, and a whisper wire. The lesion was predilated with a 2.5 x 15 balloon. The lesion was then stented with a 3.5 x 18 mm Xience drug-eluting stent. The stent was postdilated with a 3.75 x 12 mm noncompliant  balloon. Following PCI, there was 0% residual stenosis and TIMI-3 flow. Final angiography confirmed an excellent result. The patient tolerated the procedure well. There were no immediate procedural complications. A TR band was used for radial hemostasis. The patient was transferred to the post catheterization recovery area for further monitoring.  PCI Data:  Vessel - right coronary artery/Segment - proximal  Percent Stenosis (pre) 95%  TIMI-flow 3  Stent :3.5 x 18 mm Xience drug-eluting stent  Percent Stenosis (post) 0  TIMI-flow (post) 3  Final Conclusions:  1. Severe one-vessel coronary artery disease with evidence of plaque rupture and thrombotic 95% lesion in the proximal right coronary artery.  2. Successful angioplasty and drug-eluting stent placement to the proximal RCA.  Recommendations:  This was a very difficult procedure via the right radial artery due to severe tortuosity in the innominate artery. Recommend avoiding catheterization in the future via the right radial artery.  Continue dual antiplatelet therapy for at least one year. Obtain an echocardiogram to evaluate LV systolic function.  A beta blocker was not started due to episodes of bradycardia.   ECG ST @ 100bpm. NSSTTWC. Compared to prior on 02/28/14, T wave in III is now inverted. Since the QRS axis is negative in III, this likely represents a return to a normal baseline (see 11/2012 ECG) after inferior ischemic in the setting of his RCA NSTEMI.    ASSESSMENT AND PLAN  63 y/o [place] male former smoker with a past medical history significant for HTN, DM, stroke in 2011 with residual Lt arm weakness, AAA and mesenteric ischemia with h/o mesenteric artery dissection with recent PCI to RCA for NSTEMI (DES, 02/26/14) who presents with chest pain and syncope.   Chest pain. Chest pain this soon after PCI for NSTEMI is concerning for a stent issue. While he does not clinically have frank stent thrombosis, we cannot rule out a  smaller thrombus or other issue. Will treat for ACS at this time.  - cycle troponins - continue metoprolol, ASA, clopidogrel, plavix, UFH - Possible re-look angiography Monday evening if biomarkers negative  Syncope. Although in setting of emesis and diaphoresis vasovagal syncope is highly likely, it the setting of possible ischemia and arrhythmic cause is also a possibility. Will treat for ACS as above and monitor on telemetry at this time.   DM. Hold metformin for possible cath. AC and HS FS. May require SS insulin.    Tobi Bastos, MD 03/07/2014, 8:57 PM

## 2014-03-08 ENCOUNTER — Inpatient Hospital Stay (HOSPITAL_COMMUNITY): Payer: Medicare Other

## 2014-03-08 ENCOUNTER — Other Ambulatory Visit: Payer: Self-pay

## 2014-03-08 DIAGNOSIS — R55 Syncope and collapse: Secondary | ICD-10-CM | POA: Diagnosis present

## 2014-03-08 DIAGNOSIS — I200 Unstable angina: Secondary | ICD-10-CM | POA: Diagnosis not present

## 2014-03-08 DIAGNOSIS — I5030 Unspecified diastolic (congestive) heart failure: Secondary | ICD-10-CM | POA: Diagnosis not present

## 2014-03-08 DIAGNOSIS — E119 Type 2 diabetes mellitus without complications: Secondary | ICD-10-CM

## 2014-03-08 DIAGNOSIS — R079 Chest pain, unspecified: Secondary | ICD-10-CM

## 2014-03-08 DIAGNOSIS — I2510 Atherosclerotic heart disease of native coronary artery without angina pectoris: Secondary | ICD-10-CM | POA: Diagnosis not present

## 2014-03-08 DIAGNOSIS — I7102 Dissection of abdominal aorta: Secondary | ICD-10-CM | POA: Diagnosis not present

## 2014-03-08 DIAGNOSIS — R112 Nausea with vomiting, unspecified: Secondary | ICD-10-CM | POA: Insufficient documentation

## 2014-03-08 DIAGNOSIS — K551 Chronic vascular disorders of intestine: Secondary | ICD-10-CM | POA: Diagnosis not present

## 2014-03-08 DIAGNOSIS — R918 Other nonspecific abnormal finding of lung field: Secondary | ICD-10-CM | POA: Diagnosis not present

## 2014-03-08 LAB — GLUCOSE, CAPILLARY
Glucose-Capillary: 115 mg/dL — ABNORMAL HIGH (ref 70–99)
Glucose-Capillary: 164 mg/dL — ABNORMAL HIGH (ref 70–99)

## 2014-03-08 LAB — BASIC METABOLIC PANEL
BUN: 14 mg/dL (ref 6–23)
CO2: 27 mEq/L (ref 19–32)
Calcium: 9.3 mg/dL (ref 8.4–10.5)
Chloride: 97 mEq/L (ref 96–112)
Creatinine, Ser: 1.21 mg/dL (ref 0.50–1.35)
GFR calc Af Amer: 71 mL/min — ABNORMAL LOW (ref >90)
GFR calc non Af Amer: 62 mL/min — ABNORMAL LOW (ref >90)
Glucose, Bld: 133 mg/dL — ABNORMAL HIGH (ref 70–99)
Potassium: 4.4 mEq/L (ref 3.7–5.3)
Sodium: 138 mEq/L (ref 137–147)

## 2014-03-08 LAB — LIPID PANEL
Cholesterol: 121 mg/dL (ref 0–200)
HDL: 28 mg/dL — ABNORMAL LOW (ref >39)
LDL Cholesterol: 78 mg/dL (ref 0–99)
Total CHOL/HDL Ratio: 4.3 RATIO
Triglycerides: 76 mg/dL (ref <150)
VLDL: 15 mg/dL (ref 0–40)

## 2014-03-08 LAB — CBC
HCT: 42.1 % (ref 39.0–52.0)
Hemoglobin: 14 g/dL (ref 13.0–17.0)
MCH: 29.1 pg (ref 26.0–34.0)
MCHC: 33.3 g/dL (ref 30.0–36.0)
MCV: 87.5 fL (ref 78.0–100.0)
Platelets: 201 10*3/uL (ref 150–400)
RBC: 4.81 MIL/uL (ref 4.22–5.81)
RDW: 12.9 % (ref 11.5–15.5)
WBC: 10.4 10*3/uL (ref 4.0–10.5)

## 2014-03-08 LAB — HEPARIN LEVEL (UNFRACTIONATED)
Heparin Unfractionated: 0.28 IU/mL — ABNORMAL LOW (ref 0.30–0.70)
Heparin Unfractionated: 0.24 IU/mL — ABNORMAL LOW (ref 0.30–0.70)
Heparin Unfractionated: 0.33 IU/mL (ref 0.30–0.70)

## 2014-03-08 LAB — PROTIME-INR
INR: 1.14 (ref 0.00–1.49)
Prothrombin Time: 14.4 seconds (ref 11.6–15.2)

## 2014-03-08 MED ORDER — ACETAMINOPHEN 325 MG PO TABS
325.0000 mg | ORAL_TABLET | Freq: Four times a day (QID) | ORAL | Status: DC | PRN
  Administered 2014-03-08 – 2014-03-10 (×4): 325 mg via ORAL
  Filled 2014-03-08 (×4): qty 1

## 2014-03-08 NOTE — Progress Notes (Signed)
ANTICOAGULATION CONSULT NOTE - Follow Up Consult  Pharmacy Consult for Heparin  Indication: chest pain/ACS  No Known Allergies  Patient Measurements: Height: 5\' 5"  (165.1 cm) Weight: 159 lb 9.6 oz (72.394 kg) (scale C) IBW/kg (Calculated) : 61.5  Vital Signs: Temp: 98.1 F (36.7 C) (06/12 2249) Temp src: Oral (06/12 2249) BP: 117/68 mmHg (06/12 2249) Pulse Rate: 64 (06/12 2249)  Labs:  Recent Labs  03/07/14 1715 03/08/14 0403  HGB 13.8 14.0  HCT 42.2 42.1  PLT 186 201  LABPROT  --  14.4  INR  --  1.14  HEPARINUNFRC  --  0.28*  CREATININE 1.19 1.21   Estimated Creatinine Clearance: 53.7 ml/min (by C-G formula based on Cr of 1.21).  Medications:  Heparin 900 units/hr  Assessment: 63 y/o M on heparin for CP. HL is 0.28. Other labs as above.   Goal of Therapy:  Heparin level 0.3-0.7 units/ml Monitor platelets by anticoagulation protocol: Yes   Plan:  -Increase heparin drip to 1050 units/hr -1200 HL -Daily CBC/HL -Monitor for bleeding  Narda Bonds 03/08/2014,4:53 AM

## 2014-03-08 NOTE — Progress Notes (Signed)
Patient ID: Barry [name], male   DOB: February 27, 1951, 63 y.o.   MRN: [id number]    SUBJECTIVE: Patient had recent PCI. He is re\re presented with chest discomfort. Troponins are normal [place] far. His chest discomfort is improved. He's having some discomfort in his neck and his low back. His English is very limited. His wife is in the room and speaks a small amount of [place]. EKGs reveal no significant new abnormalities. He has not had any recurrent nausea and vomiting.  Filed Vitals:   03/07/14 2200 03/07/14 2207 03/07/14 2249 03/08/14 0628  BP: 120/71 120/71 117/68 118/63  Pulse: 66 67 64 69  Temp:   98.1 F (36.7 C) 98.9 F (37.2 C)  TempSrc:   Oral Oral  Resp: 17  18 18   Height:   5\' 5"  (1.651 m)   Weight:   159 lb 9.6 oz (72.394 kg) 158 lb 11.7 oz (72 kg)  SpO2: 98%  96% 98%     Intake/Output Summary (Last 24 hours) at 03/08/14 1036 Last data filed at 03/08/14 0930  Gross per 24 hour  Intake    480 ml  Output      0 ml  Net    480 ml    LABS: Basic Metabolic Panel:  Recent Labs  03/07/14 1715 03/08/14 0403  NA 138 138  K 4.4 4.4  CL 98 97  CO2 26 27  GLUCOSE 135* 133*  BUN 14 14  CREATININE 1.19 1.21  CALCIUM 9.1 9.3   Liver Function Tests:  Recent Labs  03/07/14 1715  AST 19  ALT 22  ALKPHOS 90  BILITOT 1.0  PROT 7.0  ALBUMIN 3.2*   No results found for this basename: LIPASE, AMYLASE,  in the last 72 hours CBC:  Recent Labs  03/07/14 1715 03/08/14 0403  WBC 13.5* 10.4  HGB 13.8 14.0  HCT 42.2 42.1  MCV 87.7 87.5  PLT 186 201   Cardiac Enzymes: No results found for this basename: CKTOTAL, CKMB, CKMBINDEX, TROPONINI,  in the last 72 hours BNP: No components found with this basename: POCBNP,  D-Dimer: No results found for this basename: DDIMER,  in the last 72 hours Hemoglobin A1C: No results found for this basename: HGBA1C,  in the last 72 hours Fasting Lipid Panel:  Recent Labs  03/08/14 0403  CHOL 121  HDL 28*  LDLCALC 78  TRIG 76    CHOLHDL 4.3   Thyroid Function Tests: No results found for this basename: TSH, T4TOTAL, FREET3, T3FREE, THYROIDAB,  in the last 72 hours  RADIOLOGY: Ct Head Wo Contrast  02/26/2014   CLINICAL DATA:  Chest pain.  Leg pain.  EXAM: CT HEAD WITHOUT CONTRAST  TECHNIQUE: Contiguous axial images were obtained from the base of the skull through the vertex without intravenous contrast.  COMPARISON:  CT head without contrast 09/30/2009  FINDINGS: No acute cortical infarct, hemorrhage, or mass lesion is present. The ventricles are of normal size. No significant extra-axial fluid collection is evident. The paranasal sinuses and mastoid air cells are clear. The osseous skull is intact.  IMPRESSION: Negative CT of the head.   Electronically Signed   By: Lawrence Santiago M.D.   On: 02/26/2014 14:15   Ct Angio Chest Pe W/cm &/or Wo Cm  03/07/2014   CLINICAL DATA:  Chest pain and syncope.  EXAM: CT ANGIOGRAPHY CHEST WITH CONTRAST  TECHNIQUE: Multidetector CT imaging of the chest was performed using the standard protocol during bolus administration of intravenous contrast. Multiplanar CT  image reconstructions and MIPs were obtained to evaluate the vascular anatomy.  CONTRAST:  162mL OMNIPAQUE IOHEXOL 350 MG/ML SOLN  COMPARISON:  CT 02/26/2014.  FINDINGS: Thoracic aorta e ectasia and atherosclerotic. No dissection noted. Intimal thickening and/or thrombus noted within the lumen along the wall of the thoracic aorta is stable and unchanged. Pulmonary arteries are normal. No pulmonary embolus. Cardiomegaly. Coronary artery disease.  Shotty stable mediastinal lymph nodes are noted . Small hilar lymph nodes are unchanged. Thoracic esophagus nondistended.  Large airways patent. Mild basilar atelectasis present. No focal alveolar infiltrates. No pleural effusion or pneumothorax.  Visualized upper abdominal viscera unremarkable.  Visualized thyroid unremarkable. Multiple nonspecific axillary lymph nodes are present. Chest wall is  intact. Diffuse degenerative changes thoracic spine. No acute bony abnormality identified.  Review of the MIP images confirms the above findings.  IMPRESSION: 1.  No evidence of pulmonary embolus.  2. Cardiomegaly.  Coronary artery disease.   Electronically Signed   By: Marcello Moores  Register   On: 03/07/2014 19:06   Dg Chest Portable 1 View  03/07/2014   CLINICAL DATA:  Syncope.  Chest pain.  EXAM: PORTABLE CHEST - 1 VIEW  COMPARISON:  02/26/2014  FINDINGS: Low lung volumes. Heart size is accentuated by the low volumes an AP portable nature of the study. No confluent opacities or effusions. No acute bony abnormality.  IMPRESSION: Low lung volumes.  No active disease.   Electronically Signed   By: Rolm Baptise M.D.   On: 03/07/2014 18:57   Dg Chest Portable 1 View  02/26/2014   CLINICAL DATA:  Chest pain.  EXAM: PORTABLE CHEST - 1 VIEW  COMPARISON:  None.  FINDINGS: Heart size is exaggerated by low lung volumes. Pulmonary vascular congestion is evident. No focal airspace disease is present.  IMPRESSION: 1. Low lung volumes and mild pulmonary vascular congestion. 2. No focal airspace disease.   Electronically Signed   By: Lawrence Santiago M.D.   On: 02/26/2014 13:30   Ct Angio Chest Aortic Dissect W &/or W/o  02/26/2014   CLINICAL DATA:  h/o dissection, mid and left-sided chest pain, hypertensive  EXAM: CT ANGIOGRAPHY CHEST, ABDOMEN AND PELVIS  TECHNIQUE: Multidetector CT imaging through the chest, abdomen and pelvis was performed using the standard protocol during bolus administration of intravenous contrast. Multiplanar reconstructed images including MIPs were obtained and reviewed to evaluate the vascular anatomy.  CONTRAST:  173mL OMNIPAQUE IOHEXOL 350 MG/ML SOLN  COMPARISON:  12/03/2012  FINDINGS: CTA CHEST FINDINGS  The noncontrast scout shows no hyperdense crescent, mediastinal hematoma, pleural or pericardial effusion. Patchy coronary calcifications and aortic arch calcifications are noted.  CTA reveals no  dissection or stenosis. Classic 3 vessel brachiocephalic arterial origin anatomy without proximal lesion. There is mild plaque in the distal arch and descending segment. . No significant intramural thrombus. Satisfactory opacification of pulmonary arteries noted, and there is no evidence of pulmonary emboli.  Dependent atelectasis posteriorly in both lungs. No confluent airspace consolidation.  Review of the MIP images confirms the above findings. Thoracic spine and sternum unremarkable.  CTA ABDOMEN AND PELVIS FINDINGS  Arterial findings:  Aorta: Mild scattered eccentric plaque. There is a focal ulceration just proximal to the bifurcation. No aneurysm, dissection, or stenosis.  Celiac axis: Short segment high-grade stenosis just beyond the origin at the level of the median arcuate ligament of the diaphragm, patent distally.  Superior mesenteric: Dissection in the proximal SMA, the false lumen thrombosed for length of 3.3 cm from its origin, perfused distally, extending over  a length of approximately 4 cm, with some aneurysmal dilatation up to 13 mm diameter. No extension into branch vessels. Short segment extension into the middle colic branch as before. Stable appearance from prior study.  Left renal:          Single, patent.  Right renal:         Single, patent.  Inferior mesenteric: Short segment origin stenosis related to aortic wall plaque, patent distally.  Left iliac: Ectatic, 17 mm diameter, with some eccentric plaque in the common iliac. There is mild narrowing in the proximal internal iliac artery, patent distally. The external iliac is mildly tortuous, otherwise unremarkable.  Right iliac: Mild nonocclusive plaque through the common iliac and in the proximal internal iliac. External iliac widely patent, mildly tortuous.  Venous findings:     Dedicated venous phase imaging not obtained.  Review of the MIP images confirms the above findings.  Nonvascular findings: Unremarkable arterial phase evaluation of  liver, spleen, adrenal glands, pancreas. Gallbladder is incompletely distended. Tiny bilateral renal cysts. Stomach distended by ingested material. Small bowel and colon are nondilated. Appendix not identified. No ascites. No free air. No adenopathy. Mild prostatic enlargement. Regional bones unremarkable.  IMPRESSION: 1. No aortic  dissection or other acute abnormality. 2. 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 3. Stable 17 mm ectasia of the left common iliac artery. 4. Atherosclerosis, including aortoiliac and coronary artery disease. Please note that although the presence of coronary artery calcium documents the presence of coronary artery disease, the severity of this disease and any potential stenosis cannot be assessed on this non-gated CT examination. Assessment for potential risk factor modification, dietary therapy or pharmacologic therapy may be warranted, if clinically indicated.   Electronically Signed   By: Arne Cleveland M.D.   On: 02/26/2014 14:37   Ct Angio Abd/pel W/ And/or W/o  02/26/2014   CLINICAL DATA:  h/o dissection, mid and left-sided chest pain, hypertensive  EXAM: CT ANGIOGRAPHY CHEST, ABDOMEN AND PELVIS  TECHNIQUE: Multidetector CT imaging through the chest, abdomen and pelvis was performed using the standard protocol during bolus administration of intravenous contrast. Multiplanar reconstructed images including MIPs were obtained and reviewed to evaluate the vascular anatomy.  CONTRAST:  18mL OMNIPAQUE IOHEXOL 350 MG/ML SOLN  COMPARISON:  12/03/2012  FINDINGS: CTA CHEST FINDINGS  The noncontrast scout shows no hyperdense crescent, mediastinal hematoma, pleural or pericardial effusion. Patchy coronary calcifications and aortic arch calcifications are noted.  CTA reveals no dissection or stenosis. Classic 3 vessel brachiocephalic arterial origin anatomy without proximal lesion. There is mild plaque in the distal arch and descending segment. . No significant intramural  thrombus. Satisfactory opacification of pulmonary arteries noted, and there is no evidence of pulmonary emboli.  Dependent atelectasis posteriorly in both lungs. No confluent airspace consolidation.  Review of the MIP images confirms the above findings. Thoracic spine and sternum unremarkable.  CTA ABDOMEN AND PELVIS FINDINGS  Arterial findings:  Aorta: Mild scattered eccentric plaque. There is a focal ulceration just proximal to the bifurcation. No aneurysm, dissection, or stenosis.  Celiac axis: Short segment high-grade stenosis just beyond the origin at the level of the median arcuate ligament of the diaphragm, patent distally.  Superior mesenteric: Dissection in the proximal SMA, the false lumen thrombosed for length of 3.3 cm from its origin, perfused distally, extending over a length of approximately 4 cm, with some aneurysmal dilatation up to 13 mm diameter. No extension into branch vessels. Short segment extension into the middle colic branch as  before. Stable appearance from prior study.  Left renal:          Single, patent.  Right renal:         Single, patent.  Inferior mesenteric: Short segment origin stenosis related to aortic wall plaque, patent distally.  Left iliac: Ectatic, 17 mm diameter, with some eccentric plaque in the common iliac. There is mild narrowing in the proximal internal iliac artery, patent distally. The external iliac is mildly tortuous, otherwise unremarkable.  Right iliac: Mild nonocclusive plaque through the common iliac and in the proximal internal iliac. External iliac widely patent, mildly tortuous.  Venous findings:     Dedicated venous phase imaging not obtained.  Review of the MIP images confirms the above findings.  Nonvascular findings: Unremarkable arterial phase evaluation of liver, spleen, adrenal glands, pancreas. Gallbladder is incompletely distended. Tiny bilateral renal cysts. Stomach distended by ingested material. Small bowel and colon are nondilated. Appendix not  identified. No ascites. No free air. No adenopathy. Mild prostatic enlargement. Regional bones unremarkable.  IMPRESSION: 1. No aortic  dissection or other acute abnormality. 2. 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 3. Stable 17 mm ectasia of the left common iliac artery. 4. Atherosclerosis, including aortoiliac and coronary artery disease. Please note that although the presence of coronary artery calcium documents the presence of coronary artery disease, the severity of this disease and any potential stenosis cannot be assessed on this non-gated CT examination. Assessment for potential risk factor modification, dietary therapy or pharmacologic therapy may be warranted, if clinically indicated.   Electronically Signed   By: Arne Cleveland M.D.   On: 02/26/2014 14:37    PHYSICAL EXAM   he is lying flat in bed. He says that he has some discomfort in his neck low back. Lungs are clear. Respiratory effort is nonlabored. Cardiac exam reveals S1 and S2. Abdomen is soft. Is no peripheral edema.   TELEMETRY: I reviewed telemetry today March 08, 2014. There is normal sinus rhythm.   ASSESSMENT AND PLAN:    Type II or unspecified type diabetes mellitus with unspecified complication, uncontrolled     Glucose is controlled today. No change in therapy.    CAD- RCA BMS 02/27/14     He had a very recent PCI. This makes his current symptoms concerning.    Chest pain     He had chest pain yesterday. There is question that was nitroglycerin relief. Troponins are normal [place] far. With his recent intervention, we will strongly consider relook cath on Monday.    Syncope     Patient had nausea and vomiting yesterday. Associated with this he had very brief loss of consciousness. This may have been vagal. We do not have any proof of an arrhythmia. However this has to be considered in this setting.    Nausea and vomiting     He had nausea and vomiting after eating some prepared food yesterday. It is  possible that his syncope was related to this.  He is having some neck discomfort and some low back pain. I have ordered some Tylenol for him.    Dola Argyle 03/08/2014 10:36 AM

## 2014-03-08 NOTE — Progress Notes (Signed)
ANTICOAGULATION CONSULT NOTE - Initial Consult  Pharmacy Consult for Heparin Indication: chest pain/ACS  No Known Allergies  Patient Measurements: Weight 73.5 kg on 02/28/14 Height 170 cm on 02/26/14 IBW 66 kg Heparin Dosing Weight: 72 kg  Vital Signs: Temp: 98.9 F (37.2 C) (06/13 0628) Temp src: Oral (06/13 0628) BP: 118/63 mmHg (06/13 0628) Pulse Rate: 69 (06/13 0628)  Labs:  Recent Labs  03/07/14 1715 03/08/14 0403 03/08/14 1235  HGB 13.8 14.0  --   HCT 42.2 42.1  --   PLT 186 201  --   LABPROT  --  14.4  --   INR  --  1.14  --   HEPARINUNFRC  --  0.28* 0.24*  CREATININE 1.19 1.21  --     Estimated Creatinine Clearance: 53.7 ml/min (by C-G formula based on Cr of 1.21).   Medical History: Past Medical History  Diagnosis Date  . Hypertension   . Diabetes mellitus   . Stroke 09/26/2009    Syncope.  [facility]  admission.  Marland Kitchen AAA (abdominal aortic aneurysm)   . Dissection of mesenteric artery     Medications:  Not on anticoagulation PTA  Assessment: 72 YOM s/p recent admission for NSTEMI (DES to RCA 02/26/14) now admitted with chest pain. He has been initiated on heparin for ACS, and pharmacy has been consulted to manage his anticoagulation.    Heparin level this afternoon remains subtherapeutic at 0.24 despite rate increase.  CBC remains stable. No bleeding issues noted.  Goal of Therapy:  Heparin level 0.3-0.7 units/ml Monitor platelets by anticoagulation protocol: Yes   Plan:  1. Increase heparin to 1250 units/hr 2. Heparin level in 6 hours (at 2000) 3. Daily heparin level and CBC while on therapy.   Jillene Wehrenberg C. Magic Mohler, PharmD Clinical Pharmacist-Resident Pager: [phone] Pharmacy: [phone] 03/08/2014 2:05 PM

## 2014-03-08 NOTE — ED Provider Notes (Signed)
Medical screening examination/treatment/procedure(s) were conducted as a shared visit with resident-physician practitioner(s) and myself.  I personally evaluated the patient during the encounter.  Pt is a 63 y.o. male with pmhx as above presenting with CP, diaphoresis, n/v, syncope.  Pt found to have new TWI lead III, otherwise unchanged EKG and negative first trop.  NO CP in dept and in NAD after ASA 1 SL NTG en route. Hx concerning for ACS/in-stent thrombosis. Cardiology consulted, will admit.     EKG Interpretation  Date/Time:  Friday March 07 2014 16:39:10 EDT Ventricular Rate:  95 PR Interval:  156 QRS Duration: 111 QT Interval:  369 QTC Calculation: 464 R Axis:   37 Text Interpretation:  Sinus rhythm Probable left atrial enlargement new TWI in lead III Confirmed by DOCHERTY  MD, MEGAN ([id number]) on 03/07/2014 5:18:36 PM        Neta Ehlers, MD 03/08/14 1201

## 2014-03-08 NOTE — Plan of Care (Signed)
Problem: Phase I Progression Outcomes Goal: Hemodynamically stable Outcome: Progressing Patient has remained hemodynamically stable since admission.  VSS.  Chest pain free.  Heparin drip increased to 10.5 mL/hr.  Will continue to monitor.

## 2014-03-08 NOTE — Progress Notes (Signed)
ANTICOAGULATION CONSULT NOTE - Follow Up Consult  Pharmacy Consult for Heparin Indication: chest pain/ACS  No Known Allergies  Patient Measurements: Height: 5\' 5"  (165.1 cm) Weight: 158 lb 11.7 oz (72 kg) IBW/kg (Calculated) : 61.5 Heparin Dosing Weight: 72 kg  Vital Signs: Temp: 102.1 F (38.9 C) (06/13 2045) Temp src: Oral (06/13 2045) BP: 147/77 mmHg (06/13 2045) Pulse Rate: 75 (06/13 2045)  Labs:  Recent Labs  03/07/14 1715 03/08/14 0403 03/08/14 1235 03/08/14 2020  HGB 13.8 14.0  --   --   HCT 42.2 42.1  --   --   PLT 186 201  --   --   LABPROT  --  14.4  --   --   INR  --  1.14  --   --   HEPARINUNFRC  --  0.28* 0.24* 0.33  CREATININE 1.19 1.21  --   --     Estimated Creatinine Clearance: 53.7 ml/min (by C-G formula based on Cr of 1.21).  Medications:  Infusions:  . heparin 1,250 Units/hr (03/08/14 1511)    Assessment: 63 YOM on IV heparin for ACS. Heparin level is therapeutic at 0.33 - on low end of normal.   Goal of Therapy:  Heparin level 0.3-0.7 units/ml Monitor platelets by anticoagulation protocol: Yes   Plan:  1. Increase heparin slightly to 1300 unit/hr.  2. Daily heparin level and CBC.   Sloan Leiter, PharmD, BCPS Clinical Pharmacist [phone] 03/08/2014,9:00 PM

## 2014-03-09 DIAGNOSIS — I2510 Atherosclerotic heart disease of native coronary artery without angina pectoris: Secondary | ICD-10-CM | POA: Diagnosis not present

## 2014-03-09 DIAGNOSIS — I5030 Unspecified diastolic (congestive) heart failure: Secondary | ICD-10-CM | POA: Diagnosis not present

## 2014-03-09 DIAGNOSIS — I200 Unstable angina: Secondary | ICD-10-CM | POA: Diagnosis not present

## 2014-03-09 DIAGNOSIS — R079 Chest pain, unspecified: Secondary | ICD-10-CM | POA: Diagnosis not present

## 2014-03-09 DIAGNOSIS — I7102 Dissection of abdominal aorta: Secondary | ICD-10-CM | POA: Diagnosis not present

## 2014-03-09 DIAGNOSIS — K551 Chronic vascular disorders of intestine: Secondary | ICD-10-CM | POA: Diagnosis not present

## 2014-03-09 LAB — CBC
HCT: 42.3 % (ref 39.0–52.0)
Hemoglobin: 13.6 g/dL (ref 13.0–17.0)
MCH: 28.3 pg (ref 26.0–34.0)
MCHC: 32.2 g/dL (ref 30.0–36.0)
MCV: 87.9 fL (ref 78.0–100.0)
Platelets: 216 10*3/uL (ref 150–400)
RBC: 4.81 MIL/uL (ref 4.22–5.81)
RDW: 12.9 % (ref 11.5–15.5)
WBC: 10.7 10*3/uL — ABNORMAL HIGH (ref 4.0–10.5)

## 2014-03-09 LAB — URINALYSIS, ROUTINE W REFLEX MICROSCOPIC
Bilirubin Urine: NEGATIVE
Glucose, UA: NEGATIVE mg/dL
Hgb urine dipstick: NEGATIVE
Ketones, ur: NEGATIVE mg/dL
Leukocytes, UA: NEGATIVE
Nitrite: NEGATIVE
Protein, ur: NEGATIVE mg/dL
Specific Gravity, Urine: 1.011 (ref 1.005–1.030)
Urobilinogen, UA: 1 mg/dL (ref 0.0–1.0)
pH: 6 (ref 5.0–8.0)

## 2014-03-09 LAB — GLUCOSE, CAPILLARY
Glucose-Capillary: 148 mg/dL — ABNORMAL HIGH (ref 70–99)
Glucose-Capillary: 175 mg/dL — ABNORMAL HIGH (ref 70–99)
Glucose-Capillary: 197 mg/dL — ABNORMAL HIGH (ref 70–99)
Glucose-Capillary: 158 mg/dL — ABNORMAL HIGH (ref 70–99)

## 2014-03-09 LAB — HEPARIN LEVEL (UNFRACTIONATED)
Heparin Unfractionated: <0.1 IU/mL — ABNORMAL LOW (ref 0.30–0.70)
Heparin Unfractionated: <0.1 IU/mL — ABNORMAL LOW (ref 0.30–0.70)

## 2014-03-09 MED ORDER — OXYCODONE-ACETAMINOPHEN 5-325 MG PO TABS
1.0000 | ORAL_TABLET | Freq: Once | ORAL | Status: AC
  Administered 2014-03-09: 1 via ORAL
  Filled 2014-03-09: qty 1

## 2014-03-09 NOTE — Progress Notes (Signed)
ANTICOAGULATION CONSULT NOTE - Follow Up Consult  Pharmacy Consult for Heparin Indication: chest pain/ACS  No Known Allergies  Patient Measurements: Height: 5\' 5"  (165.1 cm) Weight: 159 lb 2.8 oz (72.2 kg) IBW/kg (Calculated) : 61.5 Heparin Dosing Weight: 72 kg  Vital Signs: Temp: 99.3 F (37.4 C) (06/14 1509) Temp src: Oral (06/14 1509) BP: 131/71 mmHg (06/14 1509) Pulse Rate: 86 (06/14 1509)  Labs:  Recent Labs  03/07/14 1715 03/08/14 0403  03/08/14 2020 03/09/14 0439 03/09/14 1539  HGB 13.8 14.0  --   --  13.6  --   HCT 42.2 42.1  --   --  42.3  --   PLT 186 201  --   --  216  --   LABPROT  --  14.4  --   --   --   --   INR  --  1.14  --   --   --   --   HEPARINUNFRC  --  0.28*  < > 0.33 <0.10* <0.10*  CREATININE 1.19 1.21  --   --   --   --   < > = values in this interval not displayed.  Estimated Creatinine Clearance: 53.7 ml/min (by C-G formula based on Cr of 1.21).  Medications:  Infusions:  . heparin 1,300 Units/hr (03/09/14 1220)    Assessment: 63 YOM on IV heparin for ACS. Heparin level is subtherapeutic < 0.1 on heparin drip 1300 uts/hr. CBC stable, no bleeding noted, IV running ok per RN.    Goal of Therapy:  Heparin level 0.3-0.7 units/ml Monitor platelets by anticoagulation protocol: Yes   Plan:  1. Increase heparin slightly to 1450 unit/hr.  2. Daily heparin level and CBC.   Bonnita Nasuti Pharm.D. CPP, BCPS Clinical Pharmacist [phone] 03/09/2014 6:10 PM

## 2014-03-09 NOTE — Progress Notes (Signed)
Patient ID: Barry [name], male   DOB: 1951/04/11, 63 y.o.   MRN: [id number]    SUBJECTIVE: The patient is stable today. His English is limited. His wife is in the room and I was able to converse with her. He had a headache yesterday and neck pain. This is improved. He is still being bothered by his chronic low back pain. He has not had any significant recurrent chest pain.   Filed Vitals:   03/08/14 1403 03/08/14 2045 03/09/14 0402 03/09/14 1024  BP: 114/67 147/77 126/67 126/72  Pulse: 97 75 73 76  Temp: 99.4 F (37.4 C) 102.1 F (38.9 C) 99.5 F (37.5 C)   TempSrc: Oral Oral Oral   Resp: 20 22 18    Height:      Weight:   159 lb 2.8 oz (72.2 kg)   SpO2: 95% 96% 95%      Intake/Output Summary (Last 24 hours) at 03/09/14 1238 Last data filed at 03/09/14 0938  Gross per 24 hour  Intake    720 ml  Output      0 ml  Net    720 ml    LABS: Basic Metabolic Panel:  Recent Labs  03/07/14 1715 03/08/14 0403  NA 138 138  K 4.4 4.4  CL 98 97  CO2 26 27  GLUCOSE 135* 133*  BUN 14 14  CREATININE 1.19 1.21  CALCIUM 9.1 9.3   Liver Function Tests:  Recent Labs  03/07/14 1715  AST 19  ALT 22  ALKPHOS 90  BILITOT 1.0  PROT 7.0  ALBUMIN 3.2*   No results found for this basename: LIPASE, AMYLASE,  in the last 72 hours CBC:  Recent Labs  03/08/14 0403 03/09/14 0439  WBC 10.4 10.7*  HGB 14.0 13.6  HCT 42.1 42.3  MCV 87.5 87.9  PLT 201 216   Cardiac Enzymes: No results found for this basename: CKTOTAL, CKMB, CKMBINDEX, TROPONINI,  in the last 72 hours BNP: No components found with this basename: POCBNP,  D-Dimer: No results found for this basename: DDIMER,  in the last 72 hours Hemoglobin A1C: No results found for this basename: HGBA1C,  in the last 72 hours Fasting Lipid Panel:  Recent Labs  03/08/14 0403  CHOL 121  HDL 28*  LDLCALC 78  TRIG 76  CHOLHDL 4.3   Thyroid Function Tests: No results found for this basename: TSH, T4TOTAL, FREET3, T3FREE,  THYROIDAB,  in the last 72 hours  RADIOLOGY: Dg Chest 2 View  03/09/2014   CLINICAL DATA:  Fever.  EXAM: CHEST  2 VIEW  COMPARISON:  Chest radiograph performed 03/07/2014  FINDINGS: The lungs are well-aerated. Minimal right basilar opacity may reflect atelectasis or possibly mild pneumonia. There is no evidence of pleural effusion or pneumothorax.  The heart is borderline normal in size; the mediastinal contour is within normal limits. Lines projecting to the right of the trachea appear to be outside the patient. No acute osseous abnormalities are seen.  IMPRESSION: Minimal right basilar airspace opacity may reflect atelectasis or possibly mild pneumonia.   Electronically Signed   By: Garald Balding M.D.   On: 03/09/2014 04:38   Ct Head Wo Contrast  02/26/2014   CLINICAL DATA:  Chest pain.  Leg pain.  EXAM: CT HEAD WITHOUT CONTRAST  TECHNIQUE: Contiguous axial images were obtained from the base of the skull through the vertex without intravenous contrast.  COMPARISON:  CT head without contrast 09/30/2009  FINDINGS: No acute cortical infarct, hemorrhage, or mass lesion  is present. The ventricles are of normal size. No significant extra-axial fluid collection is evident. The paranasal sinuses and mastoid air cells are clear. The osseous skull is intact.  IMPRESSION: Negative CT of the head.   Electronically Signed   By: Lawrence Santiago M.D.   On: 02/26/2014 14:15   Ct Angio Chest Pe W/cm &/or Wo Cm  03/07/2014   CLINICAL DATA:  Chest pain and syncope.  EXAM: CT ANGIOGRAPHY CHEST WITH CONTRAST  TECHNIQUE: Multidetector CT imaging of the chest was performed using the standard protocol during bolus administration of intravenous contrast. Multiplanar CT image reconstructions and MIPs were obtained to evaluate the vascular anatomy.  CONTRAST:  151mL OMNIPAQUE IOHEXOL 350 MG/ML SOLN  COMPARISON:  CT 02/26/2014.  FINDINGS: Thoracic aorta e ectasia and atherosclerotic. No dissection noted. Intimal thickening and/or  thrombus noted within the lumen along the wall of the thoracic aorta is stable and unchanged. Pulmonary arteries are normal. No pulmonary embolus. Cardiomegaly. Coronary artery disease.  Shotty stable mediastinal lymph nodes are noted . Small hilar lymph nodes are unchanged. Thoracic esophagus nondistended.  Large airways patent. Mild basilar atelectasis present. No focal alveolar infiltrates. No pleural effusion or pneumothorax.  Visualized upper abdominal viscera unremarkable.  Visualized thyroid unremarkable. Multiple nonspecific axillary lymph nodes are present. Chest wall is intact. Diffuse degenerative changes thoracic spine. No acute bony abnormality identified.  Review of the MIP images confirms the above findings.  IMPRESSION: 1.  No evidence of pulmonary embolus.  2. Cardiomegaly.  Coronary artery disease.   Electronically Signed   By: Marcello Moores  Register   On: 03/07/2014 19:06   Dg Chest Portable 1 View  03/07/2014   CLINICAL DATA:  Syncope.  Chest pain.  EXAM: PORTABLE CHEST - 1 VIEW  COMPARISON:  02/26/2014  FINDINGS: Low lung volumes. Heart size is accentuated by the low volumes an AP portable nature of the study. No confluent opacities or effusions. No acute bony abnormality.  IMPRESSION: Low lung volumes.  No active disease.   Electronically Signed   By: Rolm Baptise M.D.   On: 03/07/2014 18:57   Dg Chest Portable 1 View  02/26/2014   CLINICAL DATA:  Chest pain.  EXAM: PORTABLE CHEST - 1 VIEW  COMPARISON:  None.  FINDINGS: Heart size is exaggerated by low lung volumes. Pulmonary vascular congestion is evident. No focal airspace disease is present.  IMPRESSION: 1. Low lung volumes and mild pulmonary vascular congestion. 2. No focal airspace disease.   Electronically Signed   By: Lawrence Santiago M.D.   On: 02/26/2014 13:30   Ct Angio Chest Aortic Dissect W &/or W/o  02/26/2014   CLINICAL DATA:  h/o dissection, mid and left-sided chest pain, hypertensive  EXAM: CT ANGIOGRAPHY CHEST, ABDOMEN AND  PELVIS  TECHNIQUE: Multidetector CT imaging through the chest, abdomen and pelvis was performed using the standard protocol during bolus administration of intravenous contrast. Multiplanar reconstructed images including MIPs were obtained and reviewed to evaluate the vascular anatomy.  CONTRAST:  168mL OMNIPAQUE IOHEXOL 350 MG/ML SOLN  COMPARISON:  12/03/2012  FINDINGS: CTA CHEST FINDINGS  The noncontrast scout shows no hyperdense crescent, mediastinal hematoma, pleural or pericardial effusion. Patchy coronary calcifications and aortic arch calcifications are noted.  CTA reveals no dissection or stenosis. Classic 3 vessel brachiocephalic arterial origin anatomy without proximal lesion. There is mild plaque in the distal arch and descending segment. . No significant intramural thrombus. Satisfactory opacification of pulmonary arteries noted, and there is no evidence of pulmonary emboli.  Dependent  atelectasis posteriorly in both lungs. No confluent airspace consolidation.  Review of the MIP images confirms the above findings. Thoracic spine and sternum unremarkable.  CTA ABDOMEN AND PELVIS FINDINGS  Arterial findings:  Aorta: Mild scattered eccentric plaque. There is a focal ulceration just proximal to the bifurcation. No aneurysm, dissection, or stenosis.  Celiac axis: Short segment high-grade stenosis just beyond the origin at the level of the median arcuate ligament of the diaphragm, patent distally.  Superior mesenteric: Dissection in the proximal SMA, the false lumen thrombosed for length of 3.3 cm from its origin, perfused distally, extending over a length of approximately 4 cm, with some aneurysmal dilatation up to 13 mm diameter. No extension into branch vessels. Short segment extension into the middle colic branch as before. Stable appearance from prior study.  Left renal:          Single, patent.  Right renal:         Single, patent.  Inferior mesenteric: Short segment origin stenosis related to aortic wall  plaque, patent distally.  Left iliac: Ectatic, 17 mm diameter, with some eccentric plaque in the common iliac. There is mild narrowing in the proximal internal iliac artery, patent distally. The external iliac is mildly tortuous, otherwise unremarkable.  Right iliac: Mild nonocclusive plaque through the common iliac and in the proximal internal iliac. External iliac widely patent, mildly tortuous.  Venous findings:     Dedicated venous phase imaging not obtained.  Review of the MIP images confirms the above findings.  Nonvascular findings: Unremarkable arterial phase evaluation of liver, spleen, adrenal glands, pancreas. Gallbladder is incompletely distended. Tiny bilateral renal cysts. Stomach distended by ingested material. Small bowel and colon are nondilated. Appendix not identified. No ascites. No free air. No adenopathy. Mild prostatic enlargement. Regional bones unremarkable.  IMPRESSION: 1. No aortic  dissection or other acute abnormality. 2. 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 3. Stable 17 mm ectasia of the left common iliac artery. 4. Atherosclerosis, including aortoiliac and coronary artery disease. Please note that although the presence of coronary artery calcium documents the presence of coronary artery disease, the severity of this disease and any potential stenosis cannot be assessed on this non-gated CT examination. Assessment for potential risk factor modification, dietary therapy or pharmacologic therapy may be warranted, if clinically indicated.   Electronically Signed   By: Arne Cleveland M.D.   On: 02/26/2014 14:37   Ct Angio Abd/pel W/ And/or W/o  02/26/2014   CLINICAL DATA:  h/o dissection, mid and left-sided chest pain, hypertensive  EXAM: CT ANGIOGRAPHY CHEST, ABDOMEN AND PELVIS  TECHNIQUE: Multidetector CT imaging through the chest, abdomen and pelvis was performed using the standard protocol during bolus administration of intravenous contrast. Multiplanar reconstructed  images including MIPs were obtained and reviewed to evaluate the vascular anatomy.  CONTRAST:  172mL OMNIPAQUE IOHEXOL 350 MG/ML SOLN  COMPARISON:  12/03/2012  FINDINGS: CTA CHEST FINDINGS  The noncontrast scout shows no hyperdense crescent, mediastinal hematoma, pleural or pericardial effusion. Patchy coronary calcifications and aortic arch calcifications are noted.  CTA reveals no dissection or stenosis. Classic 3 vessel brachiocephalic arterial origin anatomy without proximal lesion. There is mild plaque in the distal arch and descending segment. . No significant intramural thrombus. Satisfactory opacification of pulmonary arteries noted, and there is no evidence of pulmonary emboli.  Dependent atelectasis posteriorly in both lungs. No confluent airspace consolidation.  Review of the MIP images confirms the above findings. Thoracic spine and sternum unremarkable.  CTA ABDOMEN AND PELVIS  FINDINGS  Arterial findings:  Aorta: Mild scattered eccentric plaque. There is a focal ulceration just proximal to the bifurcation. No aneurysm, dissection, or stenosis.  Celiac axis: Short segment high-grade stenosis just beyond the origin at the level of the median arcuate ligament of the diaphragm, patent distally.  Superior mesenteric: Dissection in the proximal SMA, the false lumen thrombosed for length of 3.3 cm from its origin, perfused distally, extending over a length of approximately 4 cm, with some aneurysmal dilatation up to 13 mm diameter. No extension into branch vessels. Short segment extension into the middle colic branch as before. Stable appearance from prior study.  Left renal:          Single, patent.  Right renal:         Single, patent.  Inferior mesenteric: Short segment origin stenosis related to aortic wall plaque, patent distally.  Left iliac: Ectatic, 17 mm diameter, with some eccentric plaque in the common iliac. There is mild narrowing in the proximal internal iliac artery, patent distally. The  external iliac is mildly tortuous, otherwise unremarkable.  Right iliac: Mild nonocclusive plaque through the common iliac and in the proximal internal iliac. External iliac widely patent, mildly tortuous.  Venous findings:     Dedicated venous phase imaging not obtained.  Review of the MIP images confirms the above findings.  Nonvascular findings: Unremarkable arterial phase evaluation of liver, spleen, adrenal glands, pancreas. Gallbladder is incompletely distended. Tiny bilateral renal cysts. Stomach distended by ingested material. Small bowel and colon are nondilated. Appendix not identified. No ascites. No free air. No adenopathy. Mild prostatic enlargement. Regional bones unremarkable.  IMPRESSION: 1. No aortic  dissection or other acute abnormality. 2. 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 3. Stable 17 mm ectasia of the left common iliac artery. 4. Atherosclerosis, including aortoiliac and coronary artery disease. Please note that although the presence of coronary artery calcium documents the presence of coronary artery disease, the severity of this disease and any potential stenosis cannot be assessed on this non-gated CT examination. Assessment for potential risk factor modification, dietary therapy or pharmacologic therapy may be warranted, if clinically indicated.   Electronically Signed   By: Arne Cleveland M.D.   On: 02/26/2014 14:37     PHYSICAL EXAM  the patient is oriented. The communication is primarily a combination of talking to the patient and his wife at the same time. Lungs are clear. Respiratory effort is nonlabored. Cardiac exam reveals an S1-S2. The abdomen is soft. There is no peripheral edema.   TELEMETRY: I have reviewed telemetry today March 09, 2014. There is normal sinus rhythm.   ASSESSMENT AND PLAN:    CAD- RCA BMS 02/27/14   Chest pain      The patient had a recent bare-metal stent. He was admitted with recurring chest pain. He admitting physician was  concerned about his symptoms. His troponins have been negative. However we will proceed with a relook catheterization tomorrow to be sure that his overall status is stable. His initial symptoms were significant.    Syncope   Nausea and vomiting    The patient had Nausea and vomiting when he had his chest discomfort. Associated with this he had presyncopal episode. I feel most likely that this was vagal at that time. However because he had the symptoms along with his chest pain, we are proceeding with relook catheterization tomorrow.     Dola Argyle 03/09/2014 12:38 PM

## 2014-03-09 NOTE — Progress Notes (Signed)
Report given to receiving RN. Patient in bed resting, with family at the bedside. PRN pain med given. No signs or symptoms of distress noted.

## 2014-03-10 ENCOUNTER — Other Ambulatory Visit (HOSPITAL_COMMUNITY): Payer: Medicare Other

## 2014-03-10 ENCOUNTER — Inpatient Hospital Stay (HOSPITAL_COMMUNITY): Payer: Medicare Other

## 2014-03-10 ENCOUNTER — Encounter (HOSPITAL_COMMUNITY)
Admission: EM | Disposition: A | Discharge status: Home / Self Care | Payer: Self-pay | Source: Home / Self Care | Attending: Cardiology

## 2014-03-10 ENCOUNTER — Ambulatory Visit: Payer: Medicare Other | Admitting: Surgery

## 2014-03-10 ENCOUNTER — Encounter (HOSPITAL_COMMUNITY): Payer: Self-pay | Admitting: Interventional Cardiology

## 2014-03-10 DIAGNOSIS — K551 Chronic vascular disorders of intestine: Secondary | ICD-10-CM | POA: Diagnosis not present

## 2014-03-10 DIAGNOSIS — I498 Other specified cardiac arrhythmias: Secondary | ICD-10-CM | POA: Diagnosis not present

## 2014-03-10 DIAGNOSIS — E785 Hyperlipidemia, unspecified: Secondary | ICD-10-CM | POA: Diagnosis not present

## 2014-03-10 DIAGNOSIS — I2510 Atherosclerotic heart disease of native coronary artery without angina pectoris: Secondary | ICD-10-CM | POA: Diagnosis not present

## 2014-03-10 DIAGNOSIS — R918 Other nonspecific abnormal finding of lung field: Secondary | ICD-10-CM | POA: Diagnosis not present

## 2014-03-10 DIAGNOSIS — I7102 Dissection of abdominal aorta: Secondary | ICD-10-CM | POA: Diagnosis not present

## 2014-03-10 DIAGNOSIS — E119 Type 2 diabetes mellitus without complications: Secondary | ICD-10-CM | POA: Diagnosis not present

## 2014-03-10 DIAGNOSIS — I200 Unstable angina: Secondary | ICD-10-CM | POA: Diagnosis not present

## 2014-03-10 DIAGNOSIS — I5030 Unspecified diastolic (congestive) heart failure: Secondary | ICD-10-CM | POA: Diagnosis not present

## 2014-03-10 HISTORY — PX: LEFT HEART CATHETERIZATION WITH CORONARY ANGIOGRAM: SHX5451

## 2014-03-10 LAB — CBC
HCT: 39.3 % (ref 39.0–52.0)
Hemoglobin: 12.7 g/dL — ABNORMAL LOW (ref 13.0–17.0)
MCH: 28.4 pg (ref 26.0–34.0)
MCHC: 32.3 g/dL (ref 30.0–36.0)
MCV: 87.9 fL (ref 78.0–100.0)
Platelets: 210 10*3/uL (ref 150–400)
RBC: 4.47 MIL/uL (ref 4.22–5.81)
RDW: 13 % (ref 11.5–15.5)
WBC: 10.7 10*3/uL — ABNORMAL HIGH (ref 4.0–10.5)

## 2014-03-10 LAB — GLUCOSE, CAPILLARY
Glucose-Capillary: 142 mg/dL — ABNORMAL HIGH (ref 70–99)
Glucose-Capillary: 153 mg/dL — ABNORMAL HIGH (ref 70–99)
Glucose-Capillary: 97 mg/dL (ref 70–99)
Glucose-Capillary: 137 mg/dL — ABNORMAL HIGH (ref 70–99)
Glucose-Capillary: 119 mg/dL — ABNORMAL HIGH (ref 70–99)

## 2014-03-10 LAB — HEPARIN LEVEL (UNFRACTIONATED)
Heparin Unfractionated: 0.84 IU/mL — ABNORMAL HIGH (ref 0.30–0.70)
Heparin Unfractionated: 0.73 IU/mL — ABNORMAL HIGH (ref 0.30–0.70)

## 2014-03-10 LAB — URINE CULTURE
Colony Count: NO GROWTH
Culture: NO GROWTH

## 2014-03-10 LAB — POCT ACTIVATED CLOTTING TIME
Activated Clotting Time: 310 seconds
Activated Clotting Time: 409 seconds

## 2014-03-10 SURGERY — LEFT HEART CATHETERIZATION WITH CORONARY ANGIOGRAM
Anesthesia: LOCAL

## 2014-03-10 MED ORDER — HEPARIN (PORCINE) IN NACL 2-0.9 UNIT/ML-% IJ SOLN
INTRAMUSCULAR | Status: AC
  Filled 2014-03-10: qty 1500

## 2014-03-10 MED ORDER — SODIUM CHLORIDE 0.9 % IJ SOLN
3.0000 mL | Freq: Two times a day (BID) | INTRAMUSCULAR | Status: DC
  Administered 2014-03-10: 3 mL via INTRAVENOUS

## 2014-03-10 MED ORDER — ASPIRIN EC 81 MG PO TBEC: 81.0000 mg | DELAYED_RELEASE_TABLET | Freq: Every day | ORAL | Status: DC

## 2014-03-10 MED ORDER — ASPIRIN 81 MG PO CHEW
81.0000 mg | CHEWABLE_TABLET | ORAL | Status: AC
  Administered 2014-03-10: 81 mg via ORAL
  Filled 2014-03-10: qty 1

## 2014-03-10 MED ORDER — SODIUM CHLORIDE 0.9 % IV SOLN
1.0000 mL/kg/h | INTRAVENOUS | Status: DC
  Administered 2014-03-10: 1 mL/kg/h via INTRAVENOUS

## 2014-03-10 MED ORDER — CLOPIDOGREL BISULFATE 75 MG PO TABS
75.0000 mg | ORAL_TABLET | Freq: Every day | ORAL | Status: DC
  Administered 2014-03-11: 09:00:00 75 mg via ORAL

## 2014-03-10 MED ORDER — IBUPROFEN 400 MG PO TABS
400.0000 mg | ORAL_TABLET | Freq: Four times a day (QID) | ORAL | Status: DC | PRN
  Filled 2014-03-10: qty 1

## 2014-03-10 MED ORDER — HEPARIN SODIUM (PORCINE) 1000 UNIT/ML IJ SOLN
INTRAMUSCULAR | Status: AC
  Filled 2014-03-10: qty 1

## 2014-03-10 MED ORDER — VERAPAMIL HCL 2.5 MG/ML IV SOLN
INTRAVENOUS | Status: AC
  Filled 2014-03-10: qty 2

## 2014-03-10 MED ORDER — LIDOCAINE HCL (PF) 1 % IJ SOLN
INTRAMUSCULAR | Status: AC
  Filled 2014-03-10: qty 30

## 2014-03-10 MED ORDER — SODIUM CHLORIDE 0.9 % IJ SOLN: 3.0000 mL | INTRAMUSCULAR | Status: DC | PRN

## 2014-03-10 MED ORDER — MIDAZOLAM HCL 2 MG/2ML IJ SOLN
INTRAMUSCULAR | Status: AC
  Filled 2014-03-10: qty 2

## 2014-03-10 MED ORDER — SODIUM CHLORIDE 0.9 % IV SOLN: 1.0000 mL/kg/h | INTRAVENOUS | Status: AC

## 2014-03-10 MED ORDER — FENTANYL CITRATE 0.05 MG/ML IJ SOLN
INTRAMUSCULAR | Status: AC
Start: 2014-03-10 — End: 2014-03-10
  Filled 2014-03-10: qty 2

## 2014-03-10 MED ORDER — CLOPIDOGREL BISULFATE 300 MG PO TABS
ORAL_TABLET | ORAL | Status: AC
  Filled 2014-03-10: qty 1

## 2014-03-10 MED ORDER — NITROGLYCERIN 0.4 MG SL SUBL: 0.4000 mg | SUBLINGUAL_TABLET | SUBLINGUAL | Status: DC | PRN

## 2014-03-10 MED ORDER — METFORMIN HCL 500 MG PO TABS
500.0000 mg | ORAL_TABLET | Freq: Two times a day (BID) | ORAL | Status: DC
  Filled 2014-03-10: qty 1

## 2014-03-10 MED ORDER — DIPHENHYDRAMINE HCL 25 MG PO TABS
50.0000 mg | ORAL_TABLET | Freq: Every day | ORAL | Status: DC | PRN
  Filled 2014-03-10: qty 2

## 2014-03-10 MED ORDER — PANTOPRAZOLE SODIUM 40 MG PO TBEC
40.0000 mg | DELAYED_RELEASE_TABLET | Freq: Every day | ORAL | Status: DC
  Administered 2014-03-10 – 2014-03-11 (×2): 40 mg via ORAL
  Filled 2014-03-10 (×2): qty 1

## 2014-03-10 MED ORDER — KETOROLAC TROMETHAMINE 30 MG/ML IJ SOLN
30.0000 mg | Freq: Once | INTRAMUSCULAR | Status: AC
  Administered 2014-03-10: 30 mg via INTRAVENOUS
  Filled 2014-03-10: qty 1

## 2014-03-10 MED ORDER — ASPIRIN 81 MG PO CHEW: 81.0000 mg | CHEWABLE_TABLET | Freq: Every day | ORAL | Status: DC

## 2014-03-10 MED ORDER — NITROGLYCERIN 0.2 MG/ML ON CALL CATH LAB
INTRAVENOUS | Status: AC
  Filled 2014-03-10: qty 1

## 2014-03-10 MED ORDER — SODIUM CHLORIDE 0.9 % IV SOLN: 250.0000 mL | INTRAVENOUS | Status: DC | PRN

## 2014-03-10 NOTE — CV Procedure (Signed)
PROCEDURE:  Left heart catheterization with selective coronary angiography, PCI left circumflex.  INDICATIONS:  Unstable angina  The risks, benefits, and details of the procedure were explained to the patient.  The patient verbalized understanding and wanted to proceed.  Informed written consent was obtained.  PROCEDURE TECHNIQUE:  After Xylocaine anesthesia a 68F slender sheath was placed in the right radial artery with a single anterior needle wall stick, proximal to the prior stick.  IV heparin was given. Right coronary angiography was done using an  EBU 3 guide catheter.  Left coronary angiography was done using a EBU 3 catheter.  Left  heart catheterization  was done using the same EBU 3 catheter .  Please see below for details. The intervention was done. A TR band was used for hemostasis.   CONTRAST:  Total of 115 cc.  COMPLICATIONS:  None.    HEMODYNAMICS:  Aortic pressure was 114/72; LV pressure was 115/5; LVEDP 8.  There was no gradient between the left ventricle and aorta.    ANGIOGRAPHIC DATA:   The left main coronary artery is widely patent.  The left anterior descending artery is a large vessel which reaches the apex. There is mild atherosclerotic disease in the mid vessel. There is a medium-sized diagonal vessel which is patent. There several small diagonal vessels which are patent. There is no obstructive disease in the LAD system.  The left circumflex artery is a large vessel. There is a medium-sized obtuse marginal patent. Just after the obtuse marginal, there is a focal 90% stenosis. There is some poststenotic dilatation. There is a small obtuse marginal which has a 70% ostial stenosis after the circumflex stenosis. There is a large OM 3 which is widely patent.  The right coronary artery is a large dominant vessel. The previously placed stent is widely patent there is mild disease in the mid to distal vessel. In the posterior lateral artery is large and branches across  the lateral wall. The posterior descending artery is large with a mild ostial stenosis and reaches the apex.Marland Kitchen  LEFT VENTRICULOGRAM:  Left ventricular angiogram was not done.  LVEDP was 8 mmHg.    PCI NARRATIVE: A 5 French EBU 3.0 guiding catheter was used for the diagnostic catheterization since the tortuosity in the right innominate did not allow for a JL catheter. Once the lesion the circumflex was recognized, we decided to proceed with intervention. Unfortunately, guide catheter position was lost. We then were able to obtain engagement of the left main again, after visualizing the RCA with this same catheter. IV heparin was given. ACT used to check that anticoagulation was therapeutic.  A BMW wire was advanced into the LAD to help with guide support. A pro-water wire was placed across the lesion in the circumflex. The guide catheter was then pulled into the left main. A 2.5 x 12 balloon was used to predilate the lesion in the circumflex. A 2.5 x 18 stent was then used to treat the entire area disease in the mid circumflex. The stent was post dilated with a 2.75 x 12 noncompliant balloon with several inflations. There was an excellent angiographic result. There is no residual stenosis. Several doses of intra-coronary nitroglycerin were given.   IMPRESSIONS:  1. Normal left main coronary artery. 2.  Mild disease in the  left anterior descending artery and its branches. 3.  90% stenosis in the mid left circumflex artery.  This was successfully treated with a 2.5 x 19mm Xience  drug-eluting stent, postdilated to 2.8 millimeters in diameter. 4.  Widely patent stent in the right coronary artery. 5. Left ventricular systolic function not assessed.  LVEDP 8 mmHg.   6.   Severe tortuosity in the right innominate artery which makes catheter placement from the right radial approach very difficult. In the future, if catheterization was needed, would recommend left radial or groin. Given his history of AAA and  mesenteric vessel dissection, would have to consider left radial approach.  RECOMMENDATION:   continue dual antiplatelet therapy for at least a year. Continue aggressive secondary prevention. He'll be watched overnight. Consider discharge tomorrow if there are no complications. The patient's wife was present during the procedure to help with communication since the patient does not speak [place].

## 2014-03-10 NOTE — Progress Notes (Signed)
    Subjective:  C/O Lt sacroiliac pain. No chest pain, no nausea  Objective:  Vital Signs in the last 24 hours: Temp:  [98.8 F (37.1 C)-99.8 F (37.7 C)] 98.8 F (37.1 C) (06/15 0623) Pulse Rate:  [73-86] 75 (06/15 0623) Resp:  [17-18] 17 (06/15 0623) BP: (114-131)/(64-72) 114/64 mmHg (06/15 0623) SpO2:  [94 %-95 %] 95 % (06/15 0623) Weight:  [72.122 kg (159 lb)] 72.122 kg (159 lb) (06/15 0623)  Intake/Output from previous day:  Intake/Output Summary (Last 24 hours) at 03/10/14 0828 Last data filed at 03/10/14 0500  Gross per 24 hour  Intake    720 ml  Output   1000 ml  Net   -280 ml    Physical Exam: General appearance: alert, cooperative, mild distress and c/o back pain Lungs: clear to auscultation bilaterally Heart: regular rate and rhythm Back- no obvious ecchymosis, Lt sacroiliac tenderness to deep palpation   Rate: 70  Rhythm: normal sinus rhythm  Lab Results:  Recent Labs  03/09/14 0439 03/10/14 0330  WBC 10.7* 10.7*  HGB 13.6 12.7*  PLT 216 210    Recent Labs  03/07/14 1715 03/08/14 0403  NA 138 138  K 4.4 4.4  CL 98 97  CO2 26 27  GLUCOSE 135* 133*  BUN 14 14  CREATININE 1.19 1.21   No results found for this basename: TROPONINI, CK, MB,  in the last 72 hours  Recent Labs  03/08/14 0403  INR 1.14    Imaging: Imaging results have been reviewed   Scheduled Meds: . aspirin EC  81 mg Oral Daily  . atorvastatin  80 mg Oral q1800  . clopidogrel  75 mg Oral Q breakfast  . metoprolol tartrate  12.5 mg Oral BID  . pantoprazole  40 mg Oral Q0600  . sodium chloride  3 mL Intravenous Q12H   Continuous Infusions: . sodium chloride 1 mL/kg/hr (03/10/14 0956)  . heparin 1,300 Units/hr (03/10/14 0559)   PRN Meds:.sodium chloride, acetaminophen, ibuprofen, nitroGLYCERIN, sodium chloride   Assessment/Plan:  63 y/o [place] male former smoker with a PMH significant for HTN, DM, stroke in 2011 with residual Lt arm weakness, mesenteric  ischemia with h/o mesenteric artery dissection and CAD with a recent PCI to RCA for NSTEMI (DES, 02/26/14) who was admitted 03/07/14 with chest pain and syncope.    Principal Problem:   Chest pain with moderate risk of acute coronary syndrome Active Problems:   CAD- RCA BMS 02/27/14   NSTEMI- 02/27/14   Type II or unspecified type diabetes mellitus with unspecified complication, uncontrolled   Syncope in setting of nausea and vomiting 03/07/14   Nausea and vomiting 03/07/14   Hypertension   CVA (cerebral vascular accident)   Language barrier   Mesenteric artery stenosis   Non compliance with medical treatment   Dyslipidemia   Bradycardia- beta blocker decreased    PLAN: Cath today. Will try Toradol x 1 and Advil prn for back pain. BMP in am. Add PPI.  Kerin Ransom PA-C [facility] [phone] 03/10/2014, 8:28 AM   Recent PCI/stent RCA with recurrent pain Plan per weekend has been cath today  Continue asa and plavix If restenosis should have P2Y checked  Jenkins Rouge

## 2014-03-10 NOTE — Care Management Note (Addendum)
  Page 1 of 1   03/10/2014     4:13:27 PM CARE MANAGEMENT NOTE 03/10/2014  Patient:  Barry [name], Barry [name]   Account Number:  [id number]  Date Initiated:  03/10/2014  Documentation initiated by:  Fuller Mandril  Subjective/Objective Assessment:   63 y/o [place] male former smoker with a PMHx of HTN, DM, stroke in 2011 with residual Lt arm weakness, AAA and mesenteric ischemia with h/o mesenteric artery dissection with recent PCI to RCA for NSTEMI in w/CP.//Home w/spouse.     Action/Plan:   treat for ACS at this time.  - cycle troponins  - continue metoprolol, ASA, clopidogrel, plavix, UFH  -Re-look angiography.// Access for [facility] services.   Anticipated DC Date:  03/11/2014   Anticipated DC Plan:  [place]  CM consult      Choice offered to / List presented to:             Status of service:  Completed, signed off Medicare Important Message given?  YES (If response is "NO", the following Medicare IM given date fields will be blank) Date Medicare IM given:  03/10/2014 Date Additional Medicare IM given:    Discharge Disposition:  HOME/SELF CARE  Per UR Regulation:  Reviewed for med. necessity/level of care/duration of stay  If discussed at [place] of Stay Meetings, dates discussed:    Comments:  Crystal Hutchinson RN, BSN, MSHL, CCM  Nurse - Case Manager, (Unit [phone]  03/10/2014 Left heart catheterization 03/10/2014 Med Review:  Plavix

## 2014-03-10 NOTE — H&P (View-Only) (Signed)
    Subjective:  C/O Lt sacroiliac pain. No chest pain, no nausea  Objective:  Vital Signs in the last 24 hours: Temp:  [98.8 F (37.1 C)-99.8 F (37.7 C)] 98.8 F (37.1 C) (06/15 0623) Pulse Rate:  [73-86] 75 (06/15 0623) Resp:  [17-18] 17 (06/15 0623) BP: (114-131)/(64-72) 114/64 mmHg (06/15 0623) SpO2:  [94 %-95 %] 95 % (06/15 0623) Weight:  [72.122 kg (159 lb)] 72.122 kg (159 lb) (06/15 0623)  Intake/Output from previous day:  Intake/Output Summary (Last 24 hours) at 03/10/14 0828 Last data filed at 03/10/14 0500  Gross per 24 hour  Intake    720 ml  Output   1000 ml  Net   -280 ml    Physical Exam: General appearance: alert, cooperative, mild distress and c/o back pain Lungs: clear to auscultation bilaterally Heart: regular rate and rhythm Back- no obvious ecchymosis, Lt sacroiliac tenderness to deep palpation   Rate: 70  Rhythm: normal sinus rhythm  Lab Results:  Recent Labs  03/09/14 0439 03/10/14 0330  WBC 10.7* 10.7*  HGB 13.6 12.7*  PLT 216 210    Recent Labs  03/07/14 1715 03/08/14 0403  NA 138 138  K 4.4 4.4  CL 98 97  CO2 26 27  GLUCOSE 135* 133*  BUN 14 14  CREATININE 1.19 1.21   No results found for this basename: TROPONINI, CK, MB,  in the last 72 hours  Recent Labs  03/08/14 0403  INR 1.14    Imaging: Imaging results have been reviewed   Scheduled Meds: . aspirin EC  81 mg Oral Daily  . atorvastatin  80 mg Oral q1800  . clopidogrel  75 mg Oral Q breakfast  . metoprolol tartrate  12.5 mg Oral BID  . pantoprazole  40 mg Oral Q0600  . sodium chloride  3 mL Intravenous Q12H   Continuous Infusions: . sodium chloride 1 mL/kg/hr (03/10/14 0956)  . heparin 1,300 Units/hr (03/10/14 0559)   PRN Meds:.sodium chloride, acetaminophen, ibuprofen, nitroGLYCERIN, sodium chloride   Assessment/Plan:  63 y/o [place] male former smoker with a PMH significant for HTN, DM, stroke in 2011 with residual Lt arm weakness, mesenteric  ischemia with h/o mesenteric artery dissection and CAD with a recent PCI to RCA for NSTEMI (DES, 02/26/14) who was admitted 03/07/14 with chest pain and syncope.    Principal Problem:   Chest pain with moderate risk of acute coronary syndrome Active Problems:   CAD- RCA BMS 02/27/14   NSTEMI- 02/27/14   Type II or unspecified type diabetes mellitus with unspecified complication, uncontrolled   Syncope in setting of nausea and vomiting 03/07/14   Nausea and vomiting 03/07/14   Hypertension   CVA (cerebral vascular accident)   Language barrier   Mesenteric artery stenosis   Non compliance with medical treatment   Dyslipidemia   Bradycardia- beta blocker decreased    PLAN: Cath today. Will try Toradol x 1 and Advil prn for back pain. BMP in am. Add PPI.  Kerin Ransom PA-C Beeper [phone] 03/10/2014, 8:28 AM   Recent PCI/stent RCA with recurrent pain Plan per weekend has been cath today  Continue asa and plavix If restenosis should have P2Y checked  Jenkins Rouge

## 2014-03-10 NOTE — Progress Notes (Signed)
ANTICOAGULATION CONSULT NOTE - Follow Up Consult  Pharmacy Consult for Heparin  Indication: chest pain/ACS  No Known Allergies  Patient Measurements: Height: 5\' 5"  (165.1 cm) Weight: 159 lb (72.122 kg) IBW/kg (Calculated) : 61.5  Vital Signs: Temp: 98.8 F (37.1 C) (06/15 0623) Temp src: Oral (06/15 0623) BP: 127/71 mmHg (06/15 0957) Pulse Rate: 67 (06/15 0957)  Labs:  Recent Labs  03/07/14 1715 03/08/14 0403  03/09/14 0439 03/09/14 1539 03/10/14 0330 03/10/14 1240  HGB 13.8 14.0  --  13.6  --  12.7*  --   HCT 42.2 42.1  --  42.3  --  39.3  --   PLT 186 201  --  216  --  210  --   LABPROT  --  14.4  --   --   --   --   --   INR  --  1.14  --   --   --   --   --   HEPARINUNFRC  --  0.28*  < > <0.10* <0.10* 0.84* 0.73*  CREATININE 1.19 1.21  --   --   --   --   --   < > = values in this interval not displayed. Estimated Creatinine Clearance: 53.7 ml/min (by C-G formula based on Cr of 1.21).  Medications:  . sodium chloride 1 mL/kg/hr (03/10/14 0956)  . heparin 1,300 Units/hr (03/10/14 0559)    Assessment: 63 y/o M with recent PCI/stent of RCA with recurrent chest pain on IV heparin. He is currently in the cath lab.  Heparin level is supratherapeutic at 0.73 on 1300 units/hr. No bleeding noted, CBC is stable.  Goal of Therapy:  Heparin level 0.3-0.7 units/ml Monitor platelets by anticoagulation protocol: Yes   Plan:  -Decrease heparin drip to 1200 units/hr -6 hr heparin level if not stopped post cath -Daily CBC ad heparin level -Monitor for bleeding  [facility], Pharm.D., BCPS Clinical Pharmacist Pager: [phone] 03/10/2014 1:34 PM

## 2014-03-10 NOTE — Interval H&P Note (Signed)
Cath Lab Visit (complete for each Cath Lab visit)  Clinical Evaluation Leading to the Procedure:   ACS: yes  Non-ACS:    Anginal Classification: CCS IV  Anti-ischemic medical therapy: Minimal Therapy (1 class of medications)  Non-Invasive Test Results: No non-invasive testing performed  Prior CABG: No previous CABG  Will relook at left system as well since it was not well visualized at the last cath.      History and Physical Interval Note:  03/10/2014 12:58 PM  Barry [name]  has presented today for surgery, with the diagnosis of chest pain  The various methods of treatment have been discussed with the patient and family. After consideration of risks, benefits and other options for treatment, the patient has consented to  Procedure(s): LEFT HEART CATHETERIZATION WITH CORONARY ANGIOGRAM (N/A) as a surgical intervention .  The patient's history has been reviewed, patient examined, no change in status, stable for surgery.  I have reviewed the patient's chart and labs.  Questions were answered to the patient's satisfaction.     Hamdan Toscano S.

## 2014-03-10 NOTE — Progress Notes (Signed)
UR completed Yaqueline Gutter K. Abbygale Lapid, RN, BSN, MSHL, CCM  03/10/2014 4:10 PM

## 2014-03-10 NOTE — Progress Notes (Signed)
ANTICOAGULATION CONSULT NOTE - Follow Up Consult  Pharmacy Consult for Heparin  Indication: chest pain/ACS  No Known Allergies  Patient Measurements: Height: 5\' 5"  (165.1 cm) Weight: 159 lb 2.8 oz (72.2 kg) IBW/kg (Calculated) : 61.5  Vital Signs: Temp: 99.8 F (37.7 C) (06/14 2120) Temp src: Oral (06/14 2120) BP: 118/66 mmHg (06/14 2120) Pulse Rate: 73 (06/14 2120)  Labs:  Recent Labs  03/07/14 1715 03/08/14 0403  03/09/14 0439 03/09/14 1539 03/10/14 0330  HGB 13.8 14.0  --  13.6  --  12.7*  HCT 42.2 42.1  --  42.3  --  39.3  PLT 186 201  --  216  --  210  LABPROT  --  14.4  --   --   --   --   INR  --  1.14  --   --   --   --   HEPARINUNFRC  --  0.28*  < > <0.10* <0.10* 0.84*  CREATININE 1.19 1.21  --   --   --   --   < > = values in this interval not displayed. Estimated Creatinine Clearance: 53.7 ml/min (by C-G formula based on Cr of 1.21).  Medications:  Heparin 1450 units/hr  Assessment: 63 y/o M on heparin for CP. HL is 0.84. Other labs as above.   Goal of Therapy:  Heparin level 0.3-0.7 units/ml Monitor platelets by anticoagulation protocol: Yes   Plan:  -Decrease heparin drip to 1300 units/hr -1300 HL -Daily CBC/HL -Monitor for bleeding  Narda Bonds 03/10/2014,5:50 AM

## 2014-03-11 ENCOUNTER — Encounter (HOSPITAL_COMMUNITY): Payer: Self-pay | Admitting: Physician Assistant

## 2014-03-11 DIAGNOSIS — I5030 Unspecified diastolic (congestive) heart failure: Secondary | ICD-10-CM

## 2014-03-11 DIAGNOSIS — E119 Type 2 diabetes mellitus without complications: Secondary | ICD-10-CM | POA: Diagnosis not present

## 2014-03-11 DIAGNOSIS — I714 Abdominal aortic aneurysm, without rupture, unspecified: Secondary | ICD-10-CM

## 2014-03-11 DIAGNOSIS — R079 Chest pain, unspecified: Secondary | ICD-10-CM | POA: Diagnosis not present

## 2014-03-11 DIAGNOSIS — I200 Unstable angina: Secondary | ICD-10-CM

## 2014-03-11 DIAGNOSIS — I2510 Atherosclerotic heart disease of native coronary artery without angina pectoris: Secondary | ICD-10-CM | POA: Diagnosis not present

## 2014-03-11 DIAGNOSIS — I509 Heart failure, unspecified: Secondary | ICD-10-CM

## 2014-03-11 DIAGNOSIS — I7102 Dissection of abdominal aorta: Secondary | ICD-10-CM | POA: Diagnosis not present

## 2014-03-11 DIAGNOSIS — I214 Non-ST elevation (NSTEMI) myocardial infarction: Secondary | ICD-10-CM

## 2014-03-11 DIAGNOSIS — K551 Chronic vascular disorders of intestine: Secondary | ICD-10-CM | POA: Diagnosis not present

## 2014-03-11 LAB — CBC
HCT: 39.1 % (ref 39.0–52.0)
Hemoglobin: 12.9 g/dL — ABNORMAL LOW (ref 13.0–17.0)
MCH: 28.8 pg (ref 26.0–34.0)
MCHC: 33 g/dL (ref 30.0–36.0)
MCV: 87.3 fL (ref 78.0–100.0)
Platelets: 219 10*3/uL (ref 150–400)
RBC: 4.48 MIL/uL (ref 4.22–5.81)
RDW: 12.9 % (ref 11.5–15.5)
WBC: 6.9 10*3/uL (ref 4.0–10.5)

## 2014-03-11 LAB — BASIC METABOLIC PANEL
BUN: 16 mg/dL (ref 6–23)
CO2: 23 mEq/L (ref 19–32)
Calcium: 8.8 mg/dL (ref 8.4–10.5)
Chloride: 97 mEq/L (ref 96–112)
Creatinine, Ser: 1.13 mg/dL (ref 0.50–1.35)
GFR calc Af Amer: 78 mL/min — ABNORMAL LOW (ref >90)
GFR calc non Af Amer: 67 mL/min — ABNORMAL LOW (ref >90)
Glucose, Bld: 227 mg/dL — ABNORMAL HIGH (ref 70–99)
Potassium: 4.2 mEq/L (ref 3.7–5.3)
Sodium: 135 mEq/L — ABNORMAL LOW (ref 137–147)

## 2014-03-11 LAB — GLUCOSE, CAPILLARY
Glucose-Capillary: 129 mg/dL — ABNORMAL HIGH (ref 70–99)
Glucose-Capillary: 147 mg/dL — ABNORMAL HIGH (ref 70–99)

## 2014-03-11 MED ORDER — LISINOPRIL 20 MG PO TABS: 20.0000 mg | ORAL_TABLET | Freq: Every day | ORAL | Status: DC

## 2014-03-11 MED ORDER — METFORMIN HCL 500 MG PO TABS: 500.0000 mg | ORAL_TABLET | Freq: Two times a day (BID) | ORAL | Status: DC

## 2014-03-11 MED ORDER — PANTOPRAZOLE SODIUM 40 MG PO TBEC: 40.0000 mg | DELAYED_RELEASE_TABLET | Freq: Every day | ORAL | Status: DC

## 2014-03-11 NOTE — Discharge Summary (Signed)
Discharge Summary   Patient ID: Barry [name] MRN: [id number], DOB/AGE: [birth year] 63 y.o. Admit date: 03/07/2014 D/C date:     03/11/2014  Primary Cardiologist: Dr. Aundra Dubin  Principal Problem:   Unstable angina Active Problems:   Hypertension   CVA (cerebral vascular accident)   Language barrier   Dissection of mesenteric artery   Mesenteric artery stenosis   Diastolic CHF   Type II or unspecified type diabetes mellitus with unspecified complication, uncontrolled   CAD- RCA BMS 02/27/14   Dyslipidemia   Bradycardia- beta blocker decreased   Syncope in setting of nausea and vomiting 03/07/14   AAA (abdominal aortic aneurysm)   Discharge Diagnosis: Unstable angina s/p DES to mLCx  HPI: 63 y/o [place] male former smoker with a PMH significant for HTN, DM, AAA, stroke in 2011 with residual Lt arm weakness, mesenteric ischemia with h/o mesenteric artery dissection and CAD with a recent PCI to RCA for NSTEMI (DES, 02/26/14) who was admitted 03/07/14 with chest pain and syncope.   [name] was recently hospitalized from 6/3-6/5 for an NSTEMI. He had a RCA DES placed 02/26/14 for single vessel disease. His EF was 50% by echo. Discharged on ASA, atorvastatin, clopidogrel 75mg , lisinopril 20mg  qd, and metoprolol tartrate 12.5mg  BID. On the day of admission he developed developed left sided CP with nausea and vomiting after eating some food. Patient had LOC after emesis. He was noted to be diaphoretic by son and EMS was called. LOC was brief and he awoke with chest pain. He was given nitro by EMS which caused CP to abate. On interview with patient (via family members), [name] has apparently been experiencing low levels of chest pain nearly continuously since discharge. He has taken SL NTG 3-4 times over the last week when the pain has become particularly severe and this has helped with the pain. Son reports that [name] has been taking all medications as prescribed including clopidogrel.   Hospital Course: In  the ED the patient was CP free, his enzymes negative and ECG without acute evidence of ischemia. He was sent for a CTA which did not demonstrate PE. He was admitted over the weekend for further observation and scheduled for cardiac cath the following Monday. He underwent cardiac cath on 03/10/14 which revealed IMPRESSIONS:  1. Normal left main coronary artery. 2. Mild disease in the left anterior descending artery and its branches. 3. 90% stenosis in the mid left circumflex artery. This was successfully treated with a 2.5 x 3mm Xience drug-eluting stent, postdilated to 2.8 millimeters in diameter. 4. Widely patent stent in the right coronary artery. 5. Left ventricular systolic function not assessed. LVEDP 8 mmHg.        6. Severe tortuosity in the right innominate artery which makes catheter placement from the right radial approach very difficult. In the future, if catheterization was needed, would recommend left radial or groin. Given his history of AAA and mesenteric vessel dissection, would have to consider left radial approach.  RECOMMENDATION: continue dual antiplatelet therapy for at least a year. Continue aggressive secondary prevention.  The patient has had an uncomplicated hospital course and is recovering well. The radial catheter site is stable. He has been seen by Dr. Martinique today and deemed ready for discharge home. All follow-up appointments have been scheduled. An interpreter has been arranged for his follow up appointment. Smoking cessation was disscussed in length. Discharge medications are listed below. His metformin will be held 48 hours post cardiac cath to  obviate CIN.   Discharge Vitals: Blood pressure 125/69, pulse 69, temperature 99 F (37.2 C), temperature source Oral, resp. rate 18, height 5\' 5"  (1.651 m), weight 160 lb 7.9 oz (72.8 kg), SpO2 97.00%.  Labs: Lab Results  Component Value Date   WBC 6.9 03/11/2014   HGB 12.9* 03/11/2014   HCT 39.1 03/11/2014   MCV 87.3  03/11/2014   PLT 219 03/11/2014    Recent Labs Lab 03/07/14 1715  03/11/14 0429  NA 138  < > 135*  K 4.4  < > 4.2  CL 98  < > 97  CO2 26  < > 23  BUN 14  < > 16  CREATININE 1.19  < > 1.13  CALCIUM 9.1  < > 8.8  PROT 7.0  --   --   BILITOT 1.0  --   --   ALKPHOS 90  --   --   ALT 22  --   --   AST 19  --   --   GLUCOSE 135*  < > 227*  < > = values in this interval not displayed.  Lab Results  Component Value Date   CHOL 121 03/08/2014   HDL 28* 03/08/2014   LDLCALC 78 03/08/2014   TRIG 76 03/08/2014     Diagnostic Studies/Procedures   Dg Chest 2 View  03/10/2014   CLINICAL DATA:  Right basilar airspace opacity  EXAM: CHEST  2 VIEW  COMPARISON:  03/08/2014  FINDINGS: Low lung volumes. Mild bilateral interstitial prominence likely related to low lung volumes. There is no focal parenchymal opacity, pleural effusion, or pneumothorax. The heart and mediastinal contours are unremarkable.  The osseous structures are unremarkable.  IMPRESSION: No active cardiopulmonary disease.   Dg Chest 2 View  03/09/2014   CLINICAL DATA:  Fever.  EXAM: CHEST  2 VIEW  COMPARISON:  Chest radiograph performed 03/07/2014  FINDINGS: The lungs are well-aerated. Minimal right basilar opacity may reflect atelectasis or possibly mild pneumonia. There is no evidence of pleural effusion or pneumothorax.  The heart is borderline normal in size; the mediastinal contour is within normal limits. Lines projecting to the right of the trachea appear to be outside the patient. No acute osseous abnormalities are seen.  IMPRESSION: Minimal right basilar airspace opacity may reflect atelectasis or possibly mild pneumonia.      Ct Angio Chest Pe W/cm &/or Wo Cm  03/07/2014   CLINICAL DATA:  Chest pain and syncope.  EXAM: CT ANGIOGRAPHY CHEST WITH CONTRAST  TECHNIQUE: Multidetector CT imaging of the chest was performed using the standard protocol during bolus administration of intravenous contrast. Multiplanar CT image  reconstructions and MIPs were obtained to evaluate the vascular anatomy.  CONTRAST:  155mL OMNIPAQUE IOHEXOL 350 MG/ML SOLN  COMPARISON:  CT 02/26/2014.  FINDINGS: Thoracic aorta e ectasia and atherosclerotic. No dissection noted. Intimal thickening and/or thrombus noted within the lumen along the wall of the thoracic aorta is stable and unchanged. Pulmonary arteries are normal. No pulmonary embolus. Cardiomegaly. Coronary artery disease.  Shotty stable mediastinal lymph nodes are noted . Small hilar lymph nodes are unchanged. Thoracic esophagus nondistended.  Large airways patent. Mild basilar atelectasis present. No focal alveolar infiltrates. No pleural effusion or pneumothorax.  Visualized upper abdominal viscera unremarkable.  Visualized thyroid unremarkable. Multiple nonspecific axillary lymph nodes are present. Chest wall is intact. Diffuse degenerative changes thoracic spine. No acute bony abnormality identified.  Review of the MIP images confirms the above findings.  IMPRESSION: 1.  No evidence  of pulmonary embolus.  2. Cardiomegaly.  Coronary artery disease.     Dg Chest Portable 1 View  03/07/2014   CLINICAL DATA:  Syncope.  Chest pain.  EXAM: PORTABLE CHEST - 1 VIEW  COMPARISON:  02/26/2014  FINDINGS: Low lung volumes. Heart size is accentuated by the low volumes an AP portable nature of the study. No confluent opacities or effusions. No acute bony abnormality.  IMPRESSION: Low lung volumes.  No active disease.      PROCEDURE: Left heart catheterization with selective coronary angiography, PCI left circumflex.  INDICATIONS: Unstable angina  The risks, benefits, and details of the procedure were explained to the patient. The patient verbalized understanding and wanted to proceed. Informed written consent was obtained.  PROCEDURE TECHNIQUE: After Xylocaine anesthesia a 32F slender sheath was placed in the right radial artery with a single anterior needle wall stick, proximal to the prior stick. IV  heparin was given. Right coronary angiography was done using an EBU 3 guide catheter. Left coronary angiography was done using a EBU 3 catheter. Left heart catheterization was done using the same EBU 3 catheter . Please see below for details. The intervention was done. A TR band was used for hemostasis.  CONTRAST: Total of 115 cc.  COMPLICATIONS: None.  HEMODYNAMICS: Aortic pressure was 114/72; LV pressure was 115/5; LVEDP 8. There was no gradient between the left ventricle and aorta.  ANGIOGRAPHIC DATA: The left main coronary artery is widely patent.  The left anterior descending artery is a large vessel which reaches the apex. There is mild atherosclerotic disease in the mid vessel. There is a medium-sized diagonal vessel which is patent. There several small diagonal vessels which are patent. There is no obstructive disease in the LAD system.  The left circumflex artery is a large vessel. There is a medium-sized obtuse marginal patent. Just after the obtuse marginal, there is a focal 90% stenosis. There is some poststenotic dilatation. There is a small obtuse marginal which has a 70% ostial stenosis after the circumflex stenosis. There is a large OM 3 which is widely patent.  The right coronary artery is a large dominant vessel. The previously placed stent is widely patent there is mild disease in the mid to distal vessel. In the posterior lateral artery is large and branches across the lateral wall. The posterior descending artery is large with a mild ostial stenosis and reaches the apex.Marland Kitchen  LEFT VENTRICULOGRAM: Left ventricular angiogram was not done. LVEDP was 8 mmHg.  PCI NARRATIVE: A 5 French EBU 3.0 guiding catheter was used for the diagnostic catheterization since the tortuosity in the right innominate did not allow for a JL catheter. Once the lesion the circumflex was recognized, we decided to proceed with intervention. Unfortunately, guide catheter position was lost. We then were able to obtain  engagement of the left main again, after visualizing the RCA with this same catheter. IV heparin was given. ACT used to check that anticoagulation was therapeutic. A BMW wire was advanced into the LAD to help with guide support. A pro-water wire was placed across the lesion in the circumflex. The guide catheter was then pulled into the left main. A 2.5 x 12 balloon was used to predilate the lesion in the circumflex. A 2.5 x 18 stent was then used to treat the entire area disease in the mid circumflex. The stent was post dilated with a 2.75 x 12 noncompliant balloon with several inflations. There was an excellent angiographic result. There is no residual stenosis.  Several doses of intra-coronary nitroglycerin were given.  IMPRESSIONS:  6. Normal left main coronary artery. 7. Mild disease in the left anterior descending artery and its branches. 8. 90% stenosis in the mid left circumflex artery. This was successfully treated with a 2.5 x 86mm Xience drug-eluting stent, postdilated to 2.8 millimeters in diameter. 9. Widely patent stent in the right coronary artery. 10. Left ventricular systolic function not assessed. LVEDP 8 mmHg.  6. Severe tortuosity in the right innominate artery which makes catheter placement from the right radial approach very difficult. In the future, if catheterization was needed, would recommend left radial or groin. Given his history of AAA and mesenteric vessel dissection, would have to consider left radial approach.  RECOMMENDATION: continue dual antiplatelet therapy for at least a year. Continue aggressive secondary prevention. He'll be watched overnight. Consider discharge tomorrow if there are no complications. The patient's wife was present during the procedure to help with communication since the patient does not speak [place].   2D ECHO: 02/27/2014 LV EF: 50% - 55% ------------------------------------------------------------------ Study Conclusions - Left  ventricle: The cavity size was normal. There was moderate focal basal and mild concentric hypertrophy of the left ventricle. Systolic function was normal. The estimated ejection fraction was in the range of 50% to 55%. Wall motion was normal; there were no regional wall motion abnormalities. Doppler parameters are consistent with abnormal left ventricular relaxation (grade 1 diastolic dysfunction). - Aortic valve: Trileaflet; mildly thickened, mildly calcified leaflets. There was trivial regurgitation. - Aortic root: The aortic root was normal in size. - Left atrium: The atrium was normal in size. - Right ventricle: Systolic function was normal. - Right atrium: The atrium was normal in size. - Tricuspid valve: There was mild regurgitation. - Pulmonary arteries: Systolic pressure was within the normal range. Impressions: - There was moderate focal basal and mild concentric hypertrophy of the left ventricle. Normal left ventricular size and function. No significant valvular abnormality. Normal RV function and RVSP.      Discharge Medications     Medication List         aspirin 81 MG EC tablet  Take 1 tablet (81 mg total) by mouth daily.     atorvastatin 80 MG tablet  Commonly known as:  LIPITOR  Take 1 tablet (80 mg total) by mouth daily at 6 PM.     clopidogrel 75 MG tablet  Commonly known as:  PLAVIX  Take 1 tablet (75 mg total) by mouth daily with breakfast.     diphenhydrAMINE 25 MG tablet  Commonly known as:  BENADRYL  Take 50 mg by mouth daily as needed for allergies.     lisinopril 20 MG tablet  Commonly known as:  ZESTRIL  Take 1 tablet (20 mg total) by mouth daily.     metFORMIN 500 MG tablet  Commonly known as:  GLUCOPHAGE  Take 1 tablet (500 mg total) by mouth 2 (two) times daily with a meal.  Start taking on:  03/13/2014     metoprolol tartrate 25 MG tablet  Commonly known as:  LOPRESSOR  Take 12.5 mg by mouth 2 (two) times daily.     nitroGLYCERIN  0.4 MG SL tablet  Commonly known as:  NITROSTAT  Place 1 tablet (0.4 mg total) under the tongue every 5 (five) minutes as needed for chest pain.     OVER THE COUNTER MEDICATION  Take 1 tablet by mouth daily. Natural E Complete from [employer] - contains Vitamin E 30 units  and selenium 10 mcg     pantoprazole 40 MG tablet  Commonly known as:  PROTONIX  Take 1 tablet (40 mg total) by mouth daily.        Disposition   The patient will be discharged in stable condition to home.  Follow-up Information   Follow up with Richardson Dopp, PA-C On 03/31/2014. (@ 2:40pm)    Specialty:  Physician Assistant   Contact information:   [address] [address] [phone]         Duration of Discharge Encounter: Greater than 30 minutes including physician and PA time.  SignedPerry Mount PA-C 03/11/2014, 11:21 AM   Patient seen and examined and history reviewed. Agree with above findings and plan. Patient complains of fever and sweats. No cough or dysuria. Low grade temp. Blood and urine cultures are negative. No obvious infection source. No radial hematoma. Stable for DC today. Continue DAPT for at least one year.  Nubia Ziesmer Martin[place], [place] 03/11/2014 12:48 PM

## 2014-03-11 NOTE — Progress Notes (Signed)
CARDIAC REHAB PHASE I   PRE:  Rate/Rhythm: 77 SR  BP:  Supine: 127/63  Sitting:   Standing:    SaO2:   MODE:  Ambulation: 1000 ft   POST:  Rate/Rhythm: 84 SR  BP:  Supine:   Sitting: 121/63  Standing:    SaO2:  0900-1015  Assisted X 1 to ambulate. Gait steady. Pt walked 1000 feet denies any cp or SOB. VS stable Pt back to side of bed after walk. Reviewed discharge education with pt using interruption line interpretor [phone]. Last admission pt's signed permit for his daughter to interpret for him. Pt was swallowing NTG tablets and using a lot of the daily. I explained to him how to use them properly and if he felt dizzy after using one to call 911 or if his discomfort was not relieved.If he had to use any to call the Cardiologist. They voice understanding.  Rodney Langton RN 03/11/2014 10:15 AM

## 2014-03-14 ENCOUNTER — Telehealth: Payer: Self-pay | Admitting: Cardiology

## 2014-03-14 NOTE — Telephone Encounter (Signed)
Spoke with son and answered questions about protonix

## 2014-03-14 NOTE — Telephone Encounter (Signed)
New message     Pt was discharged from hosp last week----son has questions about his medications

## 2014-03-15 LAB — CULTURE, BLOOD (ROUTINE X 2)
Culture: NO GROWTH
Culture: NO GROWTH

## 2014-03-17 ENCOUNTER — Telehealth: Payer: Self-pay | Admitting: Cardiology

## 2014-03-17 NOTE — Telephone Encounter (Signed)
New problem   Since pt has came from hospital he has been in the bed and not eating. [name] said he isn't being his self. Pt stated he spoke to someone this morning and no one has called him back. Also pt is lying in the bed sweating.  Please call pt.

## 2014-03-17 NOTE — Telephone Encounter (Signed)
[name], Pros states pt had stomach pain and nausea before eating this morning. After pt ate he felt better. Son states pt just got his protonix filled today. He is not sure when pt had his last bowel movement. He states he does sweat at night. Son states glucose today was 120.  [name] does not think pt has infection symptoms, including problems urinating/UR congestion or cough. Son states pt denies chest pain/tightness/SOB. I reviewed with Dr Vassie Moment recommended pt report to Urgent Care if symptoms persist. I have scheduled pt to see Dr Aundra Dubin 03/27/14.

## 2014-03-18 ENCOUNTER — Encounter (HOSPITAL_COMMUNITY): Payer: Self-pay | Admitting: Emergency Medicine

## 2014-03-18 ENCOUNTER — Inpatient Hospital Stay (HOSPITAL_COMMUNITY)
Admission: EM | Admit: 2014-03-18 | Discharge: 2014-03-20 | DRG: 391 | Disposition: A | Discharge status: Home Health | Payer: Medicare Other | Attending: Internal Medicine | Admitting: Internal Medicine

## 2014-03-18 ENCOUNTER — Emergency Department (HOSPITAL_COMMUNITY): Payer: Medicare Other

## 2014-03-18 DIAGNOSIS — Z7902 Long term (current) use of antithrombotics/antiplatelets: Secondary | ICD-10-CM | POA: Diagnosis not present

## 2014-03-18 DIAGNOSIS — I5030 Unspecified diastolic (congestive) heart failure: Secondary | ICD-10-CM | POA: Diagnosis present

## 2014-03-18 DIAGNOSIS — R1084 Generalized abdominal pain: Secondary | ICD-10-CM | POA: Diagnosis not present

## 2014-03-18 DIAGNOSIS — N179 Acute kidney failure, unspecified: Secondary | ICD-10-CM | POA: Diagnosis not present

## 2014-03-18 DIAGNOSIS — I714 Abdominal aortic aneurysm, without rupture, unspecified: Secondary | ICD-10-CM | POA: Diagnosis not present

## 2014-03-18 DIAGNOSIS — E1165 Type 2 diabetes mellitus with hyperglycemia: Secondary | ICD-10-CM | POA: Diagnosis present

## 2014-03-18 DIAGNOSIS — Z7982 Long term (current) use of aspirin: Secondary | ICD-10-CM

## 2014-03-18 DIAGNOSIS — R748 Abnormal levels of other serum enzymes: Secondary | ICD-10-CM | POA: Diagnosis present

## 2014-03-18 DIAGNOSIS — I509 Heart failure, unspecified: Secondary | ICD-10-CM | POA: Diagnosis not present

## 2014-03-18 DIAGNOSIS — K859 Acute pancreatitis without necrosis or infection, unspecified: Secondary | ICD-10-CM | POA: Diagnosis present

## 2014-03-18 DIAGNOSIS — R110 Nausea: Secondary | ICD-10-CM | POA: Diagnosis not present

## 2014-03-18 DIAGNOSIS — IMO0002 Reserved for concepts with insufficient information to code with codable children: Secondary | ICD-10-CM | POA: Diagnosis present

## 2014-03-18 DIAGNOSIS — I252 Old myocardial infarction: Secondary | ICD-10-CM | POA: Diagnosis not present

## 2014-03-18 DIAGNOSIS — I69998 Other sequelae following unspecified cerebrovascular disease: Secondary | ICD-10-CM | POA: Diagnosis not present

## 2014-03-18 DIAGNOSIS — E1169 Type 2 diabetes mellitus with other specified complication: Secondary | ICD-10-CM

## 2014-03-18 DIAGNOSIS — Z789 Other specified health status: Secondary | ICD-10-CM | POA: Diagnosis present

## 2014-03-18 DIAGNOSIS — E118 Type 2 diabetes mellitus with unspecified complications: Secondary | ICD-10-CM | POA: Diagnosis present

## 2014-03-18 DIAGNOSIS — E785 Hyperlipidemia, unspecified: Secondary | ICD-10-CM | POA: Diagnosis present

## 2014-03-18 DIAGNOSIS — Z9861 Coronary angioplasty status: Secondary | ICD-10-CM | POA: Diagnosis not present

## 2014-03-18 DIAGNOSIS — I10 Essential (primary) hypertension: Secondary | ICD-10-CM | POA: Diagnosis not present

## 2014-03-18 DIAGNOSIS — I5032 Chronic diastolic (congestive) heart failure: Secondary | ICD-10-CM | POA: Diagnosis present

## 2014-03-18 DIAGNOSIS — N289 Disorder of kidney and ureter, unspecified: Secondary | ICD-10-CM | POA: Diagnosis not present

## 2014-03-18 DIAGNOSIS — I2510 Atherosclerotic heart disease of native coronary artery without angina pectoris: Secondary | ICD-10-CM | POA: Diagnosis present

## 2014-03-18 DIAGNOSIS — R109 Unspecified abdominal pain: Principal | ICD-10-CM | POA: Diagnosis present

## 2014-03-18 DIAGNOSIS — Z8249 Family history of ischemic heart disease and other diseases of the circulatory system: Secondary | ICD-10-CM | POA: Diagnosis not present

## 2014-03-18 DIAGNOSIS — Z833 Family history of diabetes mellitus: Secondary | ICD-10-CM

## 2014-03-18 DIAGNOSIS — Z87891 Personal history of nicotine dependence: Secondary | ICD-10-CM | POA: Diagnosis not present

## 2014-03-18 DIAGNOSIS — I7779 Dissection of other artery: Secondary | ICD-10-CM | POA: Diagnosis present

## 2014-03-18 DIAGNOSIS — R531 Weakness: Secondary | ICD-10-CM | POA: Diagnosis present

## 2014-03-18 DIAGNOSIS — K2970 Gastritis, unspecified, without bleeding: Secondary | ICD-10-CM | POA: Diagnosis not present

## 2014-03-18 DIAGNOSIS — K551 Chronic vascular disorders of intestine: Secondary | ICD-10-CM

## 2014-03-18 DIAGNOSIS — K389 Disease of appendix, unspecified: Secondary | ICD-10-CM | POA: Diagnosis present

## 2014-03-18 DIAGNOSIS — R10819 Abdominal tenderness, unspecified site: Secondary | ICD-10-CM | POA: Diagnosis not present

## 2014-03-18 DIAGNOSIS — Z758 Other problems related to medical facilities and other health care: Secondary | ICD-10-CM | POA: Diagnosis present

## 2014-03-18 DIAGNOSIS — K55059 Acute (reversible) ischemia of intestine, part and extent unspecified: Secondary | ICD-10-CM

## 2014-03-18 DIAGNOSIS — I723 Aneurysm of iliac artery: Secondary | ICD-10-CM | POA: Diagnosis present

## 2014-03-18 DIAGNOSIS — R1032 Left lower quadrant pain: Secondary | ICD-10-CM | POA: Diagnosis not present

## 2014-03-18 HISTORY — DX: Aneurysm of iliac artery: I72.3

## 2014-03-18 HISTORY — DX: Acute myocardial infarction, unspecified: I21.9

## 2014-03-18 LAB — URINALYSIS, ROUTINE W REFLEX MICROSCOPIC
Bilirubin Urine: NEGATIVE
Glucose, UA: NEGATIVE mg/dL
Hgb urine dipstick: NEGATIVE
Ketones, ur: NEGATIVE mg/dL
Leukocytes, UA: NEGATIVE
Nitrite: NEGATIVE
Protein, ur: NEGATIVE mg/dL
Specific Gravity, Urine: 1.036 — ABNORMAL HIGH (ref 1.005–1.030)
Urobilinogen, UA: 0.2 mg/dL (ref 0.0–1.0)
pH: 5 (ref 5.0–8.0)

## 2014-03-18 LAB — CBC WITH DIFFERENTIAL/PLATELET
Basophils Absolute: 0 10*3/uL (ref 0.0–0.1)
Basophils Relative: 0 % (ref 0–1)
Eosinophils Absolute: 0.3 10*3/uL (ref 0.0–0.7)
Eosinophils Relative: 3 % (ref 0–5)
HCT: 37.8 % — ABNORMAL LOW (ref 39.0–52.0)
Hemoglobin: 12.1 g/dL — ABNORMAL LOW (ref 13.0–17.0)
Lymphocytes Relative: 15 % (ref 12–46)
Lymphs Abs: 1.3 10*3/uL (ref 0.7–4.0)
MCH: 28 pg (ref 26.0–34.0)
MCHC: 32 g/dL (ref 30.0–36.0)
MCV: 87.5 fL (ref 78.0–100.0)
Monocytes Absolute: 0.8 10*3/uL (ref 0.1–1.0)
Monocytes Relative: 9 % (ref 3–12)
Neutro Abs: 5.9 10*3/uL (ref 1.7–7.7)
Neutrophils Relative %: 73 % (ref 43–77)
Platelets: 308 10*3/uL (ref 150–400)
RBC: 4.32 MIL/uL (ref 4.22–5.81)
RDW: 13.2 % (ref 11.5–15.5)
WBC: 8.2 10*3/uL (ref 4.0–10.5)

## 2014-03-18 LAB — NA AND K (SODIUM & POTASSIUM), RAND UR
Potassium Urine: 39 mEq/L
Sodium, Ur: 64 mEq/L

## 2014-03-18 LAB — COMPREHENSIVE METABOLIC PANEL
ALT: 26 U/L (ref 0–53)
AST: 25 U/L (ref 0–37)
Albumin: 2.8 g/dL — ABNORMAL LOW (ref 3.5–5.2)
Alkaline Phosphatase: 106 U/L (ref 39–117)
BUN: 32 mg/dL — ABNORMAL HIGH (ref 6–23)
CO2: 23 mEq/L (ref 19–32)
Calcium: 9.5 mg/dL (ref 8.4–10.5)
Chloride: 99 mEq/L (ref 96–112)
Creatinine, Ser: 1.86 mg/dL — ABNORMAL HIGH (ref 0.50–1.35)
GFR calc Af Amer: 42 mL/min — ABNORMAL LOW (ref >90)
GFR calc non Af Amer: 37 mL/min — ABNORMAL LOW (ref >90)
Glucose, Bld: 107 mg/dL — ABNORMAL HIGH (ref 70–99)
Potassium: 4.9 mEq/L (ref 3.7–5.3)
Sodium: 137 mEq/L (ref 137–147)
Total Bilirubin: 0.7 mg/dL (ref 0.3–1.2)
Total Protein: 7.4 g/dL (ref 6.0–8.3)

## 2014-03-18 LAB — GLUCOSE, CAPILLARY
Glucose-Capillary: 116 mg/dL — ABNORMAL HIGH (ref 70–99)
Glucose-Capillary: 78 mg/dL (ref 70–99)
Glucose-Capillary: 70 mg/dL (ref 70–99)

## 2014-03-18 LAB — I-STAT TROPONIN, ED: Troponin i, poc: 0 ng/mL (ref 0.00–0.08)

## 2014-03-18 LAB — LACTIC ACID, PLASMA: Lactic Acid, Venous: 0.8 mmol/L (ref 0.5–2.2)

## 2014-03-18 LAB — LIPASE, BLOOD: Lipase: 179 U/L — ABNORMAL HIGH (ref 11–59)

## 2014-03-18 LAB — CREATININE, URINE, RANDOM: Creatinine, Urine: 99.4 mg/dL

## 2014-03-18 MED ORDER — ONDANSETRON HCL 4 MG PO TABS: 4.0000 mg | ORAL_TABLET | Freq: Four times a day (QID) | ORAL | Status: DC | PRN

## 2014-03-18 MED ORDER — INSULIN ASPART 100 UNIT/ML ~~LOC~~ SOLN: 0.0000 [IU] | Freq: Every day | SUBCUTANEOUS | Status: DC

## 2014-03-18 MED ORDER — ASPIRIN EC 81 MG PO TBEC
81.0000 mg | DELAYED_RELEASE_TABLET | Freq: Every day | ORAL | Status: DC
  Administered 2014-03-18 – 2014-03-20 (×3): 81 mg via ORAL
  Filled 2014-03-18 (×3): qty 1

## 2014-03-18 MED ORDER — SODIUM CHLORIDE 0.9 % IV BOLUS (SEPSIS)
1000.0000 mL | Freq: Once | INTRAVENOUS | Status: AC
  Administered 2014-03-18: 1000 mL via INTRAVENOUS

## 2014-03-18 MED ORDER — MORPHINE SULFATE 4 MG/ML IJ SOLN
4.0000 mg | Freq: Once | INTRAMUSCULAR | Status: AC
  Administered 2014-03-18: 4 mg via INTRAVENOUS
  Filled 2014-03-18: qty 1

## 2014-03-18 MED ORDER — ASPIRIN 325 MG PO TABS: 325.0000 mg | ORAL_TABLET | Freq: Once | ORAL | Status: DC

## 2014-03-18 MED ORDER — INSULIN ASPART 100 UNIT/ML ~~LOC~~ SOLN
0.0000 [IU] | Freq: Three times a day (TID) | SUBCUTANEOUS | Status: DC
  Administered 2014-03-19 – 2014-03-20 (×2): 2 [IU] via SUBCUTANEOUS

## 2014-03-18 MED ORDER — SODIUM CHLORIDE 0.9 % IJ SOLN
3.0000 mL | Freq: Two times a day (BID) | INTRAMUSCULAR | Status: DC
  Administered 2014-03-18 – 2014-03-20 (×3): 3 mL via INTRAVENOUS

## 2014-03-18 MED ORDER — CLOPIDOGREL BISULFATE 75 MG PO TABS
75.0000 mg | ORAL_TABLET | Freq: Every day | ORAL | Status: DC
  Administered 2014-03-19 – 2014-03-20 (×2): 75 mg via ORAL
  Filled 2014-03-18 (×3): qty 1

## 2014-03-18 MED ORDER — MORPHINE SULFATE 2 MG/ML IJ SOLN
2.0000 mg | INTRAMUSCULAR | Status: DC | PRN
  Administered 2014-03-18: 2 mg via INTRAVENOUS
  Filled 2014-03-18: qty 1

## 2014-03-18 MED ORDER — SODIUM CHLORIDE 0.9 % IV SOLN
INTRAVENOUS | Status: DC
  Administered 2014-03-18 – 2014-03-19 (×3): via INTRAVENOUS

## 2014-03-18 MED ORDER — IOHEXOL 300 MG/ML  SOLN
80.0000 mL | Freq: Once | INTRAMUSCULAR | Status: AC | PRN
  Administered 2014-03-18: 80 mL via INTRAVENOUS

## 2014-03-18 MED ORDER — ONDANSETRON HCL 4 MG/2ML IJ SOLN
4.0000 mg | Freq: Four times a day (QID) | INTRAMUSCULAR | Status: DC | PRN
  Administered 2014-03-18: 4 mg via INTRAVENOUS
  Filled 2014-03-18: qty 2

## 2014-03-18 MED ORDER — ASPIRIN 81 MG PO CHEW
324.0000 mg | CHEWABLE_TABLET | Freq: Once | ORAL | Status: AC
  Administered 2014-03-18: 324 mg via ORAL
  Filled 2014-03-18: qty 4

## 2014-03-18 MED ORDER — HEPARIN SODIUM (PORCINE) 5000 UNIT/ML IJ SOLN
5000.0000 [IU] | Freq: Three times a day (TID) | INTRAMUSCULAR | Status: DC
  Administered 2014-03-18 – 2014-03-20 (×7): 5000 [IU] via SUBCUTANEOUS
  Filled 2014-03-18 (×8): qty 1

## 2014-03-18 MED ORDER — ONDANSETRON HCL 4 MG/2ML IJ SOLN
4.0000 mg | Freq: Once | INTRAMUSCULAR | Status: AC
  Administered 2014-03-18: 4 mg via INTRAVENOUS
  Filled 2014-03-18: qty 2

## 2014-03-18 NOTE — ED Notes (Signed)
Patient here with bilateral lower quadrant abdominal pain starting about 3 days ago. Recent history of NSTEMI with cath and stent placement about a week ago.

## 2014-03-18 NOTE — Consult Note (Signed)
Reason for Consult: Abdominal pain; rule out appendicitis Referring Physician: Dr. Rochele Raring Tukes is an 63 y.o. male.  HPI: This is a 63 yo [place] with limited English capability who presents with abdominal pain, nausea, and vomiting.  The patient has a very significant past cardiac history.  On 02/26/14 the patient had a NSTEMI with PCI to RCA with a drug eluting stent.  He was readmitted on 03/07/14 for chest pain, nausea, and vomiting. He had a another stent placed to the mid left circumflex.  During his initial hospitalization, he had a CT angioma of the chest, abdomen, and pelvis that shows significant vascular disease.He has significant atherosclerosis including the aortoiliac arteries and coronary arteries as well as stenosis of the celiac and inferior mesenteric arteries. The superior mesenteric artery shows a dissection in the proximal artery with thrombosis over a length of 3.3 cm from its origin which is perfused distally. This appears to be stable from a prior study In 2014.  He presents with 2 or 3 days of lower abdominal pain, nausea, and vomiting. He has had 2 small bowel movements in the last couple of days.  He was evaluated in the emergency department and was noted to have a mildly elevated lipase. A CT scan Showed a mildly dilated appendix to 9 mm but this is stable in size compared to previous studies. There is no inflammation around the appendix on CT scan. There are no other acute findings on the CT.  Past Medical History  Diagnosis Date  . Hypertension   . Diabetes mellitus   . Stroke   . AAA (abdominal aortic aneurysm)   . Dissection of mesenteric artery   . CAD (coronary artery disease)     Past Surgical History  Procedure Laterality Date  . Admission  09/26/2009    CVA.  [name].  . Cardiac catheterization    . Coronary angioplasty      Family History  Problem Relation Age of Onset  . Diabetes Father   . Hypertension Father   . Heart attack Father      Social History:  reports that he quit smoking about 4 years ago. He has never used smokeless tobacco. He reports that he does not drink alcohol or use illicit drugs.  Allergies: No Known Allergies  Medications:  Prior to Admission medications   Medication Sig Start Date End Date Taking? Authorizing Provider  aspirin EC 81 MG EC tablet Take 1 tablet (81 mg total) by mouth daily. 02/28/14  Yes Erlene Quan, PA-C  atorvastatin (LIPITOR) 80 MG tablet Take 1 tablet (80 mg total) by mouth daily at 6 PM. 02/28/14  Yes Erlene Quan, PA-C  clopidogrel (PLAVIX) 75 MG tablet Take 1 tablet (75 mg total) by mouth daily with breakfast. 02/28/14  Yes Erlene Quan, PA-C  diphenhydrAMINE (BENADRYL) 25 MG tablet Take 50 mg by mouth daily as needed for allergies.   Yes Historical Provider, MD  lisinopril (ZESTRIL) 20 MG tablet Take 1 tablet (20 mg total) by mouth daily. 03/11/14  Yes Perry Mount, PA-C  metFORMIN (GLUCOPHAGE) 500 MG tablet Take 1 tablet (500 mg total) by mouth 2 (two) times daily with a meal. 03/13/14  Yes Perry Mount, PA-C  metoprolol tartrate (LOPRESSOR) 25 MG tablet Take 12.5 mg by mouth 2 (two) times daily.   Yes Historical Provider, MD  nitroGLYCERIN (NITROSTAT) 0.4 MG SL tablet Place 1 tablet (0.4 mg total) under the tongue every 5 (five) minutes as needed  for chest pain. 02/28/14  Yes Luke K Kilroy, PA-C  OVER THE COUNTER MEDICATION Take 1 tablet by mouth daily. Natural E Complete from Nutrilite - contains Vitamin E 30 units and selenium 10 mcg   Yes Historical Provider, MD  pantoprazole (PROTONIX) 40 MG tablet Take 1 tablet (40 mg total) by mouth daily. 03/11/14  Yes Perry Mount, PA-C     Results for orders placed during the hospital encounter of 03/18/14 (from the past 48 hour(s))  COMPREHENSIVE METABOLIC PANEL     Status: Abnormal   Collection Time    03/18/14  4:55 AM      Result Value Ref Range   Sodium 137  137 - 147 mEq/L   Potassium 4.9  3.7 - 5.3 mEq/L   Chloride 99  96 -  112 mEq/L   CO2 23  19 - 32 mEq/L   Glucose, Bld 107 (*) 70 - 99 mg/dL   BUN 32 (*) 6 - 23 mg/dL   Creatinine, Ser 1.86 (*) 0.50 - 1.35 mg/dL   Calcium 9.5  8.4 - 10.5 mg/dL   Total Protein 7.4  6.0 - 8.3 g/dL   Albumin 2.8 (*) 3.5 - 5.2 g/dL   AST 25  0 - 37 U/L   ALT 26  0 - 53 U/L   Alkaline Phosphatase 106  39 - 117 U/L   Total Bilirubin 0.7  0.3 - 1.2 mg/dL   GFR calc non Af Amer 37 (*) >90 mL/min   GFR calc Af Amer 42 (*) >90 mL/min   Comment: (NOTE)     The eGFR has been calculated using the CKD EPI equation.     This calculation has not been validated in all clinical situations.     eGFR's persistently <90 mL/min signify possible Chronic Kidney     Disease.  CBC WITH DIFFERENTIAL     Status: Abnormal   Collection Time    03/18/14  4:55 AM      Result Value Ref Range   WBC 8.2  4.0 - 10.5 K/uL   RBC 4.32  4.22 - 5.81 MIL/uL   Hemoglobin 12.1 (*) 13.0 - 17.0 g/dL   HCT 37.8 (*) 39.0 - 52.0 %   MCV 87.5  78.0 - 100.0 fL   MCH 28.0  26.0 - 34.0 pg   MCHC 32.0  30.0 - 36.0 g/dL   RDW 13.2  11.5 - 15.5 %   Platelets 308  150 - 400 K/uL   Neutrophils Relative % 73  43 - 77 %   Neutro Abs 5.9  1.7 - 7.7 K/uL   Lymphocytes Relative 15  12 - 46 %   Lymphs Abs 1.3  0.7 - 4.0 K/uL   Monocytes Relative 9  3 - 12 %   Monocytes Absolute 0.8  0.1 - 1.0 K/uL   Eosinophils Relative 3  0 - 5 %   Eosinophils Absolute 0.3  0.0 - 0.7 K/uL   Basophils Relative 0  0 - 1 %   Basophils Absolute 0.0  0.0 - 0.1 K/uL  LIPASE, BLOOD     Status: Abnormal   Collection Time    03/18/14  4:57 AM      Result Value Ref Range   Lipase 179 (*) 11 - 59 U/L  I-STAT TROPOININ, ED     Status: None   Collection Time    03/18/14  5:00 AM      Result Value Ref Range   Troponin i, poc 0.00  0.00 - 0.08 ng/mL   Comment 3            Comment: Due to the release kinetics of cTnI,     a negative result within the first hours     of the onset of symptoms does not rule out     myocardial infarction with  certainty.     If myocardial infarction is still suspected,     repeat the test at appropriate intervals.    Ct Abdomen Pelvis W Contrast  03/18/2014   CLINICAL DATA:  Bilateral lower quadrant abdominal pain starting 3 days ago.  EXAM: CT ABDOMEN AND PELVIS WITH CONTRAST  TECHNIQUE: Multidetector CT imaging of the abdomen and pelvis was performed using the standard protocol following bolus administration of intravenous contrast.  CONTRAST:  46mL OMNIPAQUE IOHEXOL 300 MG/ML  SOLN  COMPARISON:  12/03/2012  FINDINGS: Appendix is distended to 9 mm. There is a small amount of adjacent fluid attenuation. This appendiceal size is stable from the prior CT, suggesting that this is a normal variant for this patient. Appendicitis should be considered if there are consistent symptoms. The small amount of adjacent fluid attenuation is nonspecific. There is a small amount of pelvic free fluid.  Colon and small bowel are unremarkable.  Heart is mildly enlarged. There is dependent subsegmental atelectasis at the lung bases.  Liver, spleen, gallbladder, pancreas, adrenal glands: Normal.  Sub cm low-density lesion in the midpole the right kidney, most likely a cyst. Kidneys otherwise unremarkable. Normal ureters. Unremarkable bladder.  There are several mildly prominent retroperitoneal and gastrohepatic ligament lymph nodes, stable from the prior exam.  Mild degenerative changes are noted throughout the visualized spine. No osteoblastic or osteolytic lesions.  IMPRESSION: 1. No definite acute findings. 2. Appendix is dilated to 9 mm, but stable in size when compared to the prior CT. This is most likely a normal variant for this patient. Small amount of adjacent fluid attenuation is likely part of the small amount of pelvic free fluid, which is nonspecific. However, acute appendicitis should be considered if this correlates clinically. 3. Mild lung base subsegmental atelectasis. Cardiomegaly. Degenerative changes of the  visualized spine.   Electronically Signed   By: Lajean Manes M.D.   On: 03/18/2014 07:13    Review of Systems  Constitutional: Negative for weight loss.  HENT: Negative for ear discharge, ear pain, hearing loss and tinnitus.   Eyes: Negative for blurred vision, double vision, photophobia and pain.  Respiratory: Negative for cough, sputum production and shortness of breath.   Cardiovascular: Negative for chest pain.  Gastrointestinal: Positive for nausea, vomiting and abdominal pain.  Genitourinary: Negative for dysuria, urgency, frequency and flank pain.  Musculoskeletal: Negative for back pain, falls, joint pain, myalgias and neck pain.  Neurological: Negative for dizziness, tingling, sensory change, focal weakness, loss of consciousness and headaches.  Endo/Heme/Allergies: Does not bruise/bleed easily.  Psychiatric/Behavioral: Negative for depression, memory loss and substance abuse. The patient is not nervous/anxious.    Blood pressure 103/67, pulse 57, temperature 97.7 F (36.5 C), temperature source Oral, resp. rate 16, height $RemoveBe'5\' 7"'HRFtMdAok$  (1.702 m), weight 145 lb (65.772 kg), SpO2 96.00%. Physical Exam WDWN in NAD - limited English but able to communicate via family HEENT:  EOMI, sclera anicteric Neck:  No masses, no thyromegaly Lungs:  CTA bilaterally; normal respiratory effort CV:  Regular rate and rhythm; no murmurs Abd:  +bowel sounds, soft, moderately tender across lower abdomen extending out to both lateral abdominal walls. No localizing  signs.  No guarding.  No palpable masses Ext:  Well-perfused; no edema Skin:  Warm, dry; no sign of jaundice  Assessment/Plan: With a normal WBC, stable CT scan, and non-localizing abdominal examination, this does not appear to be acute appendicitis.  I would be more concerned about chronic mesenteric ischemia since he has significant disease in all three mesenteric vessels.  Would check lactate to evaluate the level of mesenteric ischemia.  Would  also consider consult to Vascular Surgery.  He would be very high risk at this time for perioperative complications if he would need urgent surgery, with two recent NSTEMI and two brand-new drug-eluting stents.  Would keep him NPO x ice chips for now.  The [facility] surgery team will follow.   Cortni Tays K. 03/18/2014, 10:13 AM

## 2014-03-18 NOTE — ED Notes (Signed)
Patient finished oral contrast. CT called to inform.

## 2014-03-18 NOTE — H&P (Signed)
Date: 03/18/2014               Patient Name:  Barry [name] MRN: [id number]  DOB: 03/29/1951 Age / Sex: 63 y.o., male   PCP: No primary provider on file.         Medical Service: Internal Medicine Teaching Service         Attending Physician: Dr. Madilyn Fireman, MD    First Contact: Dr. Joni Reining Pager: [phone]  Second Contact: Dr. Bernadene Bell Pager: [phone]       After Hours (After 5p/  First Contact Pager: [phone]  weekends / holidays): Second Contact Pager: [phone]   Chief Complaint: abdominal pain  History of Present Illness: Barry [name] is a 63 yo non-English speaking [place] man who presented to [facility] ED with complaints of abdominal pain.  His history is significant for NSTEMI with PCI to RCA with drug eluting stent (02/26/14),  ~1 week after hospital discharge he was re-admitted (03/07/2014) with recurrent chest pain, nausea, and vomiting requiring repeat cardiac catheterization with subsequent DES placed to mid left circumflex artery .  His other medical history includes hypertension, Diabetes Mellitus Type II, prior CVA with mild left-sided weakness, chronic mesenteric artery dissection, and diastolic CHF. Patient speaks limited [place]. Wife and grandson at bedside assisting with translation and providing much of the history. They state that since his last hospitalization discharge 6 days ago, he has been compliant with all medications, able to ambulate with minimal assistance, eating soft foods such as rice and soups. He developed abdominal pain 3 days ago followed by and nausea 2 days ago.  He has had 3 bowel movements since discharge which have been small per his wife report but no diarrhea, blood, mucus or dark coloration.  He reports some pain on urination but denies burning or strong smell. The wife states that the patient continues to sweat and have chills "all the time" even during his prior hospitalization which prevent him from sleeping well. He normally drinks 1-3 beers daily  but has not had any since hospital discharge. Denies chest pain, no shortness of breath, no confusion, no syncope. His blood sugar has been in the "120s" per wife report. IMTS has been asked to admit patient for evaluation of acute pancreatitis given abdominal pain and lipase of 179.  No tobacco for decades.   Meds: No current facility-administered medications for this encounter.   Current Outpatient Prescriptions  Medication Sig Dispense Refill  . aspirin EC 81 MG EC tablet Take 1 tablet (81 mg total) by mouth daily.      Marland Kitchen atorvastatin (LIPITOR) 80 MG tablet Take 1 tablet (80 mg total) by mouth daily at 6 PM.  30 tablet  11  . clopidogrel (PLAVIX) 75 MG tablet Take 1 tablet (75 mg total) by mouth daily with breakfast.  30 tablet  11  . diphenhydrAMINE (BENADRYL) 25 MG tablet Take 50 mg by mouth daily as needed for allergies.      Marland Kitchen lisinopril (ZESTRIL) 20 MG tablet Take 1 tablet (20 mg total) by mouth daily.  30 tablet  11  . metFORMIN (GLUCOPHAGE) 500 MG tablet Take 1 tablet (500 mg total) by mouth 2 (two) times daily with a meal.  180 tablet  3  . metoprolol tartrate (LOPRESSOR) 25 MG tablet Take 12.5 mg by mouth 2 (two) times daily.      . nitroGLYCERIN (NITROSTAT) 0.4 MG SL tablet Place 1 tablet (0.4 mg total) under the tongue every 5 (  five) minutes as needed for chest pain.  25 tablet  2  . OVER THE COUNTER MEDICATION Take 1 tablet by mouth daily. Natural E Complete from Nutrilite - contains Vitamin E 30 units and selenium 10 mcg      . pantoprazole (PROTONIX) 40 MG tablet Take 1 tablet (40 mg total) by mouth daily.  30 tablet  11    Allergies: Allergies as of 03/18/2014  . (No Known Allergies)   Past Medical History  Diagnosis Date  . Hypertension   . Diabetes mellitus   . Stroke   . AAA (abdominal aortic aneurysm)   . Dissection of mesenteric artery   . CAD (coronary artery disease)    Past Surgical History  Procedure Laterality Date  . Admission  09/26/2009    CVA.   [name].  . Cardiac catheterization    . Coronary angioplasty     Family History  Problem Relation Age of Onset  . Diabetes Father   . Hypertension Father   . Heart attack Father    History   Social History  . Marital Status: Single    Spouse Name: N/A    Number of Children: N/A  . Years of Education: N/A   Occupational History  . Not on file.   Social History Main Topics  . Smoking status: Former Smoker    Quit date: 06/09/2009  . Smokeless tobacco: Never Used  . Alcohol Use: No  . Drug Use: No  . Sexual Activity: Not Currently   Other Topics Concern  . Not on file   Social History Narrative   Marital: married.      Lives: with son,wife.       Children: 3 children; 6 grandchildren      Employed: unemployed; disability unknown reason/CVA L sided weakness      Tobacco:  Quit 2012; smoked 40 years.       Alcohol: no drinking now; social in past.       Drugs: none       Exercise: sporadic.       ADLs:  No driving since CVA.    Review of Systems: Constitutional:  Endorses diaphoresis, chills, decreased appetite change and fatigue.  HEENT: Endorses chronic poor vision, Denies photophobia, congestion, neck pain, neck stiffness and tinnitus.  Respiratory: Denies SOB, DOE, cough, chest tightness, and wheezing.  Cardiovascular: Denies chest pain, palpitations and leg swelling.  Gastrointestinal: Endorses nausea, abdominal pain, Denies vomiting, diarrhea, constipation, blood in stool and abdominal distention.  Genitourinary: Endorses dysuria, Denies hematuria, flank pain   Musculoskeletal: Denies myalgias, back pain, joint swelling, arthralgias  Skin: Denies rash  Neurological: Endorse some left sided weakness since stroke, Denies dizziness, seizures, syncope, light-headedness, numbness and headaches.   Hematological: Denies Easy bruising  Psychiatric/ Behavioral: Denies confusion     Physical Exam: Blood pressure 110/67, pulse 56, temperature 97.7 F (36.5 C),  temperature source Oral, resp. rate 18, height 5\' 7"  (1.702 m), weight 145 lb (65.772 kg), SpO2 95.00%. General: Well-developed, well-nourished, Asian male, initially sleeping, wife and grandson at bedside; HEENT: Normocephalic, atraumatic, PERRLA, EOMI,  Moist mucous membranes, supple neck, no masses, no carotid Bruits appreciated Chest/Lungs: Normal respiratory effort. Clear to auscultation bilaterally from apices to bases anteroirly without crackles or wheezes appreciated. Heart: normal rate, regular rhythm, normal S1 and S2, no gallop, murmur, or rubs appreciated. Abdomen: BS normoactive. Soft, Nondistended, diffuse tenderness to palpation greatest at epigastrium. No rovsing, no rebound Extremities: No pretibial edema, distal pulses intact, Left  Upper and Lower strength 4/5, right side 5/5 Upper and lower Skin: no rashes noted, multiple tribal tattoos on chest and arms Neurologic: grossly non-focal, somnalent but easily arousable, appropriate and cooperative throughout examination.   Lab results: Basic Metabolic Panel:  Recent Labs  03/18/14 0455  NA 137  K 4.9  CL 99  CO2 23  GLUCOSE 107*  BUN 32*  CREATININE 1.86*  CALCIUM 9.5   Liver Function Tests:  Recent Labs  03/18/14 0455  AST 25  ALT 26  ALKPHOS 106  BILITOT 0.7  PROT 7.4  ALBUMIN 2.8*    Recent Labs  03/18/14 0457  LIPASE 179*   CBC:  Recent Labs  03/18/14 0455  WBC 8.2  NEUTROABS 5.9  HGB 12.1*  HCT 37.8*  MCV 87.5  PLT 308    Imaging results:  Ct Abdomen Pelvis W Contrast  03/18/2014   CLINICAL DATA:  Bilateral lower quadrant abdominal pain starting 3 days ago.  EXAM: CT ABDOMEN AND PELVIS WITH CONTRAST  TECHNIQUE: Multidetector CT imaging of the abdomen and pelvis was performed using the standard protocol following bolus administration of intravenous contrast.  CONTRAST:  19mL OMNIPAQUE IOHEXOL 300 MG/ML  SOLN  COMPARISON:  12/03/2012  FINDINGS: Appendix is distended to 9 mm. There is a  small amount of adjacent fluid attenuation. This appendiceal size is stable from the prior CT, suggesting that this is a normal variant for this patient. Appendicitis should be considered if there are consistent symptoms. The small amount of adjacent fluid attenuation is nonspecific. There is a small amount of pelvic free fluid.  Colon and small bowel are unremarkable.  Heart is mildly enlarged. There is dependent subsegmental atelectasis at the lung bases.  Liver, spleen, gallbladder, pancreas, adrenal glands: Normal.  Sub cm low-density lesion in the midpole the right kidney, most likely a cyst. Kidneys otherwise unremarkable. Normal ureters. Unremarkable bladder.  There are several mildly prominent retroperitoneal and gastrohepatic ligament lymph nodes, stable from the prior exam.  Mild degenerative changes are noted throughout the visualized spine. No osteoblastic or osteolytic lesions.  IMPRESSION: 1. No definite acute findings. 2. Appendix is dilated to 9 mm, but stable in size when compared to the prior CT. This is most likely a normal variant for this patient. Small amount of adjacent fluid attenuation is likely part of the small amount of pelvic free fluid, which is nonspecific. However, acute appendicitis should be considered if this correlates clinically. 3. Mild lung base subsegmental atelectasis. Cardiomegaly. Degenerative changes of the visualized spine.   Electronically Signed   By: Lajean Manes M.D.   On: 03/18/2014 07:13    Other results: EKG: normal sinus rhythm, TWI inferior leads.  Assessment & Plan by Problem: Mr. Kepner is a 63 yo male admitted with Acute Renal Failure and Abdominal Pain in setting of mesenteric artery dissection, [name].  #1 Abdominal Pain: multifactorial, possible acute pancreatitis with elevated lipase of 179, mesenteric artery dissection which appears stable on CT of abdomen, question of appendicitis which is dilated but stable without  peri-appendiceal fluid noted, no sign of bowel obstruction on CT to account for elevated lipase, although intestinal infarction in setting of mesenteric ischemia is a concern, elevated lipase could also be secondary to his acute renal failure -NPO -IV fluids -consult Surgery for further assessment of appendix -consider Vascular consult for further recommendation of mesenteric artery dissection management -Morphine for pain management -serial abdominal exams  #2 Acute Renal Failure: likely pre-renal secondary to  poor oral intake, kidneys unremarkable on CT other than possible small cyst -check urine electrolytes and FeNa -trend creatinine -cont IV fluids  Recent Labs Lab 03/18/14 0455  CREATININE 1.86*      #3 CAD: stable, s/p recent NSTEMI and PCI with DESx2, pt without chest pain -cont Plavix and ASA  HgbA1c: 9.5  #4 Hypertension: hold antihypertensives for now given soft bps -hold lisinopril -hold metoprolol -IV fluids  #5 DM Type II: poorly controlled, last HgbA1c 9.5 (6.2015), will likely need insulin for better management -hold metformin -SSI  #6 DVT ppx: Heparin SQ  Full Code   Dispo: Disposition is deferred at this time, awaiting improvement of current medical problems. Anticipated discharge in approximately 2-3 day(s).   The patient does have a current PCP (No primary provider on file.) and does not need an [facility] hospital follow-up appointment after discharge.  The patient does not have transportation limitations that hinder transportation to clinic appointments.  Signed: Jeralene Huff, MD 03/18/2014, 8:59 AM

## 2014-03-18 NOTE — Consult Note (Addendum)
[facility] [place] HISTORY AND PHYSICAL   History of Present Illness:  Patient is a 63 y.o. year old male who presents for evaluation of possible mesenteric ischemia.  Pt is a poor historian due to language barrier.  He apparently has a 3 day history of diffuse abdominal pain.  He denies vomiting.  He has had some nausea.  He has had some loose stools.  He does not really describe post prandial abdominal pain. He does not describe food fear.  He has no weight loss. The pain is diffuse and still presents but improved from earlier today.  The patient was seen by my partner Dr Trula Slade for similar findings approximately one year ago. At that time CT showed a chronic dissection of the SMA and and proximal celiac artery stenosis.  The SMA was slightly dilated at that time.  The patient was scheduled for one year follow up.  Pt has had two consecutive drug eluting coronary stents placed in the last 3 weeks and had NSTEMI approximately 3 weeks ago.  Other medical problems include diabetes, prior stroke both of which are stable.  He is currently on Plavix.  Past Medical History  Diagnosis Date  . Hypertension   . Diabetes mellitus   . Stroke   . Iliac artery aneurysm, left   . Dissection of mesenteric artery   . CAD (coronary artery disease)   . Myocardial infarction     Past Surgical History  Procedure Laterality Date  . Admission  09/26/2009    CVA.  [name].  . Cardiac catheterization    . Coronary angioplasty      Social History History  Substance Use Topics  . Smoking status: Former Smoker    Quit date: 06/09/2009  . Smokeless tobacco: Never Used  . Alcohol Use: No    Family History Family History  Problem Relation Age of Onset  . Diabetes Father   . Hypertension Father   . Heart attack Father     Allergies  No Known Allergies   Current Facility-Administered Medications  Medication Dose Route Frequency Provider Last Rate Last Dose  . 0.9 %  sodium  chloride infusion   Intravenous Continuous Jeralene Huff, MD 125 mL/hr at 03/18/14 1053    . aspirin EC tablet 81 mg  81 mg Oral Daily Jeralene Huff, MD   81 mg at 03/18/14 1208  . [START ON 03/19/2014] clopidogrel (PLAVIX) tablet 75 mg  75 mg Oral Q breakfast Jeralene Huff, MD      . heparin injection 5,000 Units  5,000 Units Subcutaneous 3 times per day Jeralene Huff, MD   5,000 Units at 03/18/14 1348  . insulin aspart (novoLOG) injection 0-5 Units  0-5 Units Subcutaneous QHS Joni Reining, DO      . insulin aspart (novoLOG) injection 0-9 Units  0-9 Units Subcutaneous TID WC Joni Reining, DO      . morphine 2 MG/ML injection 2 mg  2 mg Intravenous Q3H PRN Jeralene Huff, MD      . ondansetron [facility]) tablet 4 mg  4 mg Oral Q6H PRN Jeralene Huff, MD       Or  . ondansetron [facility]) injection 4 mg  4 mg Intravenous Q6H PRN Jeralene Huff, MD   4 mg at 03/18/14 1053  . sodium chloride 0.9 % injection 3 mL  3 mL Intravenous Q12H Jeralene Huff, MD   3 mL at 03/18/14 1052  ROS:   General:  No weight loss Unable to obtain otherwise due to language barrier   Physical Examination  Filed Vitals:   03/18/14 0858 03/18/14 0900 03/18/14 1138 03/18/14 1547  BP: 104/63 103/67 103/70 111/70  Pulse: 55 57 60 62  Temp:   97.7 F (36.5 C) 98.3 F (36.8 C)  TempSrc:   Oral Oral  Resp: 16   18  Height:   5\' 7"  (1.702 m)   Weight:   152 lb 11.2 oz (69.264 kg)   SpO2: 95% 96% 100% 98%    Body mass index is 23.91 kg/(m^2).  General:  Alert and oriented, no acute distress HEENT: Normal Neck: No bruit or JVD Cardiac: Regular Rate and Rhythm Abdomen: Soft, protuberant diffuse tenderness with palpation, no mass, no scars Skin: No rash Extremity Pulses:  2+ radial, brachial, femoral, dorsalis pedis bilaterally Musculoskeletal: No deformity or edema  Neurologic: Upper and lower extremity motor 5/5 and symmetric  DATA:   CT abodomen reviewed celiac artery stenosis, SMA stenosis mid caused by chronic dissection unchanged since 2/14, possible IMA stenosis, ectactic left iliac, no pneumotosis or bowel wall thickening  CBC    Component Value Date/Time   WBC 8.2 03/18/2014 0455   WBC 8.0 08/02/2012 1955   RBC 4.32 03/18/2014 0455   RBC 4.83 08/02/2012 1955   HGB 12.1* 03/18/2014 0455   HGB 13.8* 08/02/2012 1955   HCT 37.8* 03/18/2014 0455   HCT 44.7 08/02/2012 1955   PLT 308 03/18/2014 0455   MCV 87.5 03/18/2014 0455   MCV 92.5 08/02/2012 1955   MCH 28.0 03/18/2014 0455   MCH 28.6 08/02/2012 1955   MCHC 32.0 03/18/2014 0455   MCHC 30.9* 08/02/2012 1955   RDW 13.2 03/18/2014 0455   LYMPHSABS 1.3 03/18/2014 0455   MONOABS 0.8 03/18/2014 0455   EOSABS 0.3 03/18/2014 0455   BASOSABS 0.0 03/18/2014 0455   Lactate 0.8 Lipase 179  CMP     Component Value Date/Time   NA 137 03/18/2014 0455   K 4.9 03/18/2014 0455   CL 99 03/18/2014 0455   CO2 23 03/18/2014 0455   GLUCOSE 107* 03/18/2014 0455   BUN 32* 03/18/2014 0455   CREATININE 1.86* 03/18/2014 0455   CREATININE 1.43* 08/02/2012 1947   CALCIUM 9.5 03/18/2014 0455   PROT 7.4 03/18/2014 0455   ALBUMIN 2.8* 03/18/2014 0455   AST 25 03/18/2014 0455   ALT 26 03/18/2014 0455   ALKPHOS 106 03/18/2014 0455   BILITOT 0.7 03/18/2014 0455   GFRNONAA 37* 03/18/2014 0455   GFRAA 42* 03/18/2014 0455      ASSESSMENT:  Anatomy consistant with chronic mesenteric picture but presentation and history are not really consistent.  Agree with observation for now.  Pt not a great candidate for operation given recent MI.  Will discuss with Dr Trula Slade who previously saw pt. Will consider mesenteric angio later this week. Other etiologies still in differential include pancreatitis, peptic ulcer disease, urinary tract infection, appendicitis.   PLAN:  Will continue to follow for now.  Possible mesenteric angio if other causes excluded; however, would also like to see some improvement of renal function  prior to considering angio.  Needs urine studies completed which are apparently pending.  Spoke with pt grandson Carlyle Lipa who will update family  Ruta Hinds, MD [facility] [place] Office: [phone] Pager: [phone]

## 2014-03-18 NOTE — Care Management Note (Addendum)
    Page 1 of 2   03/20/2014     3:33:56 PM CARE MANAGEMENT NOTE 03/20/2014  Patient:  Barry [name], Barry [name]   Account Number:  [id number]  Date Initiated:  03/18/2014  Documentation initiated by:  Barry [name]  Subjective/Objective Assessment:   63 yo non-English speaking [place] man in with complaints of ABD pain poss pancreatitis. PMHx: NSTEMI w PCI to RCA with stent (02/26/14), 1 week after discharge he was re-admitted requiring repeat cardiac cath w/ DES. //Home with spouse.     Action/Plan:   -NPO-IV fluids-consult Surgery for further assessment of appendix  -consider Vascular consult for further recommendation of mesenteric artery dissection management-Morphine for pain management-serial abdominal exams. //Access for [facility] needs   Anticipated DC Date:  03/25/2014   Anticipated DC Plan:  [place]  CM consult      [facility] Choice  HOME HEALTH   Choice offered to / List presented to:  C-4 Adult Children        [place] arranged  HH-1 RN  [name].   Status of service:  Completed, signed off Medicare Important Message given?  NA - LOS <3 / Initial given by admissions (If response is "NO", the following Medicare IM given date fields will be blank) Date Medicare IM given:   Date Additional Medicare IM given:    Discharge Disposition:  [name]  Per UR Regulation:  Reviewed for med. necessity/level of care/duration of stay  If discussed at [place] of Stay Meetings, dates discussed:    Comments:  Barry [name] J. Barry Laming, RN, BSN, [employer] [phone] Spoke with pt at bedside regarding discharge planning for [facility]. Offered pt list of home health agencies to choose from.  Pt chose [facility] to render services. Barry Sauce, RN of [facility] notified.  No DME needs identified at this time.  Contact is son, Barry [name] [phone]  03/18/14 [place], RN, BSN, [place]  [phone] Barry [name] is a 63 yo male admitted with Acute Renal Failure and Abdominal Pain in setting of mesenteric artery dissection, [name].  Non-English speaking; will continue to follow for discharge needs.

## 2014-03-18 NOTE — ED Provider Notes (Signed)
CSN: [id number]     Arrival date & time 03/18/14  0447 History   First MD Initiated Contact with Patient 03/18/14 0449     Chief Complaint  Patient presents with  . Abdominal Pain     (Consider location/radiation/quality/duration/timing/severity/associated sxs/prior Treatment) Patient is a 63 y.o. male presenting with abdominal pain. The history is provided by the patient.  Abdominal Pain Pain location:  Generalized Pain quality: aching   Pain radiates to:  Does not radiate Pain severity:  Moderate Onset quality:  Gradual Duration:  3 days Timing:  Constant Progression:  Worsening Chronicity:  New Context: recent illness (recent admission by Cardiology for CAD)   Relieved by:  Nothing Worsened by:  Nothing tried Associated symptoms: no cough, no fever, no shortness of breath and no vomiting     Past Medical History  Diagnosis Date  . Hypertension   . Diabetes mellitus   . Stroke   . AAA (abdominal aortic aneurysm)   . Dissection of mesenteric artery   . CAD (coronary artery disease)    Past Surgical History  Procedure Laterality Date  . Admission  09/26/2009    CVA.  [name].  . Cardiac catheterization    . Coronary angioplasty     Family History  Problem Relation Age of Onset  . Diabetes Father   . Hypertension Father   . Heart attack Father    History  Substance Use Topics  . Smoking status: Former Smoker    Quit date: 06/09/2009  . Smokeless tobacco: Never Used  . Alcohol Use: No    Review of Systems  Constitutional: Negative for fever.  Respiratory: Negative for cough and shortness of breath.   Gastrointestinal: Negative for vomiting and abdominal pain.  All other systems reviewed and are negative.     Allergies  Review of patient's allergies indicates no known allergies.  Home Medications   Prior to Admission medications   Medication Sig Start Date End Date Taking? Authorizing Provider  aspirin EC 81 MG EC tablet Take 1 tablet (81 mg  total) by mouth daily. 02/28/14  Yes Erlene Quan, PA-C  atorvastatin (LIPITOR) 80 MG tablet Take 1 tablet (80 mg total) by mouth daily at 6 PM. 02/28/14  Yes Erlene Quan, PA-C  clopidogrel (PLAVIX) 75 MG tablet Take 1 tablet (75 mg total) by mouth daily with breakfast. 02/28/14  Yes Erlene Quan, PA-C  diphenhydrAMINE (BENADRYL) 25 MG tablet Take 50 mg by mouth daily as needed for allergies.   Yes Historical Provider, MD  lisinopril (ZESTRIL) 20 MG tablet Take 1 tablet (20 mg total) by mouth daily. 03/11/14  Yes Perry Mount, PA-C  metFORMIN (GLUCOPHAGE) 500 MG tablet Take 1 tablet (500 mg total) by mouth 2 (two) times daily with a meal. 03/13/14  Yes Perry Mount, PA-C  metoprolol tartrate (LOPRESSOR) 25 MG tablet Take 12.5 mg by mouth 2 (two) times daily.   Yes Historical Provider, MD  nitroGLYCERIN (NITROSTAT) 0.4 MG SL tablet Place 1 tablet (0.4 mg total) under the tongue every 5 (five) minutes as needed for chest pain. 02/28/14  Yes Luke K Kilroy, PA-C  OVER THE COUNTER MEDICATION Take 1 tablet by mouth daily. Natural E Complete from [employer]ril[employer] - contains Vitamin E 30 units and selenium 10 mcg   Yes Historical Provider, MD  pantoprazole (PROTONIX) 40 MG tablet Take 1 tablet (40 mg total) by mouth daily. 03/11/14  Yes Perry Mount, PA-C   BP 115/61  Pulse 56  Temp(Src) 97.7  F (36.5 C) (Oral)  Resp 18  Ht 5\' 7"  (1.702 m)  Wt 145 lb (65.772 kg)  BMI 22.71 kg/m2  SpO2 97% Physical Exam  Nursing note and vitals reviewed. Constitutional: He is oriented to person, place, and time. He appears well-developed and well-nourished. No distress.  HENT:  Head: Normocephalic and atraumatic.  Mouth/Throat: Oropharynx is clear and moist. No oropharyngeal exudate.  Eyes: EOM are normal. Pupils are equal, round, and reactive to light.  Neck: Normal range of motion. Neck supple.  Cardiovascular: Normal rate and regular rhythm.  Exam reveals no friction rub.   No murmur heard. Pulmonary/Chest: Effort  normal and breath sounds normal. No respiratory distress. He has no wheezes. He has no rales.  Abdominal: He exhibits no distension. There is tenderness (diffuse, lower worse than upper). There is no rebound.  Musculoskeletal: Normal range of motion. He exhibits no edema.  Neurological: He is alert and oriented to person, place, and time.  Skin: No rash noted. He is not diaphoretic.    ED Course  Procedures (including critical care time) Labs Review Labs Reviewed  COMPREHENSIVE METABOLIC PANEL - Abnormal; Notable for the following:    Glucose, Bld 107 (*)    BUN 32 (*)    Creatinine, Ser 1.86 (*)    Albumin 2.8 (*)    GFR calc non Af Amer 37 (*)    GFR calc Af Amer 42 (*)    All other components within normal limits  CBC WITH DIFFERENTIAL - Abnormal; Notable for the following:    Hemoglobin 12.1 (*)    HCT 37.8 (*)    All other components within normal limits  LIPASE, BLOOD - Abnormal; Notable for the following:    Lipase 179 (*)    All other components within normal limits  URINALYSIS, ROUTINE W REFLEX MICROSCOPIC  I-STAT TROPOININ, ED    Imaging Review Ct Abdomen Pelvis W Contrast  03/18/2014   CLINICAL DATA:  Bilateral lower quadrant abdominal pain starting 3 days ago.  EXAM: CT ABDOMEN AND PELVIS WITH CONTRAST  TECHNIQUE: Multidetector CT imaging of the abdomen and pelvis was performed using the standard protocol following bolus administration of intravenous contrast.  CONTRAST:  7mL OMNIPAQUE IOHEXOL 300 MG/ML  SOLN  COMPARISON:  12/03/2012  FINDINGS: Appendix is distended to 9 mm. There is a small amount of adjacent fluid attenuation. This appendiceal size is stable from the prior CT, suggesting that this is a normal variant for this patient. Appendicitis should be considered if there are consistent symptoms. The small amount of adjacent fluid attenuation is nonspecific. There is a small amount of pelvic free fluid.  Colon and small bowel are unremarkable.  Heart is mildly  enlarged. There is dependent subsegmental atelectasis at the lung bases.  Liver, spleen, gallbladder, pancreas, adrenal glands: Normal.  Sub cm low-density lesion in the midpole the right kidney, most likely a cyst. Kidneys otherwise unremarkable. Normal ureters. Unremarkable bladder.  There are several mildly prominent retroperitoneal and gastrohepatic ligament lymph nodes, stable from the prior exam.  Mild degenerative changes are noted throughout the visualized spine. No osteoblastic or osteolytic lesions.  IMPRESSION: 1. No definite acute findings. 2. Appendix is dilated to 9 mm, but stable in size when compared to the prior CT. This is most likely a normal variant for this patient. Small amount of adjacent fluid attenuation is likely part of the small amount of pelvic free fluid, which is nonspecific. However, acute appendicitis should be considered if this correlates clinically. 3.  Mild lung base subsegmental atelectasis. Cardiomegaly. Degenerative changes of the visualized spine.   Electronically Signed   By: Lajean Manes M.D.   On: 03/18/2014 07:13     EKG Interpretation   Date/Time:  Tuesday March 18 2014 05:01:39 EDT Ventricular Rate:  64 PR Interval:  175 QRS Duration: 111 QT Interval:  421 QTC Calculation: 434 R Axis:   29 Text Interpretation:  Sinus rhythm Nonspecific T abnormalities, inferior  leads Minimal ST elevation, anterior leads Inferior T wave inversion new  Confirmed by Mingo Amber  MD, Brendin Situ ([id number]) on 03/18/2014 5:06:35 AM      MDM   Final diagnoses:  Generalized abdominal pain  Acute renal insufficiency    63 year old male presents with 3 days of abdominal pain. Recently admitted by cardiology for syncope, resulted in stents being placed due to elevated troponins. Patient without vomiting or fever, but is worsening lower abdominal pain. No difficulty with urination. Here vitals are stable except for some mildly soft blood pressures with systolics in the 80K. Patient has  diffuse belly pain, bilateral lower quadrants are the worse though. Initial labs show no white count, elevated lipase 179, acute renal insufficiency with creatinine of 1.86 from baseline around 1.1-1.2. The scan shows stable appendix dilation, however has mild free fluid around it which could be possible appendicitis.  Medicine consult for admission. Surgery consult to see patient for possible appendicitis.    Osvaldo Shipper, MD 03/18/14 [phone]

## 2014-03-19 DIAGNOSIS — I10 Essential (primary) hypertension: Secondary | ICD-10-CM | POA: Diagnosis not present

## 2014-03-19 DIAGNOSIS — N179 Acute kidney failure, unspecified: Secondary | ICD-10-CM

## 2014-03-19 DIAGNOSIS — I2510 Atherosclerotic heart disease of native coronary artery without angina pectoris: Secondary | ICD-10-CM

## 2014-03-19 DIAGNOSIS — R109 Unspecified abdominal pain: Principal | ICD-10-CM

## 2014-03-19 DIAGNOSIS — E119 Type 2 diabetes mellitus without complications: Secondary | ICD-10-CM

## 2014-03-19 DIAGNOSIS — K551 Chronic vascular disorders of intestine: Secondary | ICD-10-CM | POA: Diagnosis not present

## 2014-03-19 DIAGNOSIS — K859 Acute pancreatitis without necrosis or infection, unspecified: Secondary | ICD-10-CM

## 2014-03-19 DIAGNOSIS — I714 Abdominal aortic aneurysm, without rupture: Secondary | ICD-10-CM | POA: Diagnosis not present

## 2014-03-19 DIAGNOSIS — K55059 Acute (reversible) ischemia of intestine, part and extent unspecified: Secondary | ICD-10-CM | POA: Diagnosis not present

## 2014-03-19 LAB — URINE CULTURE
Colony Count: NO GROWTH
Culture: NO GROWTH

## 2014-03-19 LAB — GLUCOSE, CAPILLARY
Glucose-Capillary: 119 mg/dL — ABNORMAL HIGH (ref 70–99)
Glucose-Capillary: 127 mg/dL — ABNORMAL HIGH (ref 70–99)
Glucose-Capillary: 189 mg/dL — ABNORMAL HIGH (ref 70–99)
Glucose-Capillary: 68 mg/dL — ABNORMAL LOW (ref 70–99)
Glucose-Capillary: 69 mg/dL — ABNORMAL LOW (ref 70–99)
Glucose-Capillary: 73 mg/dL (ref 70–99)
Glucose-Capillary: 98 mg/dL (ref 70–99)

## 2014-03-19 LAB — CBC
HCT: 38.3 % — ABNORMAL LOW (ref 39.0–52.0)
Hemoglobin: 11.9 g/dL — ABNORMAL LOW (ref 13.0–17.0)
MCH: 27.9 pg (ref 26.0–34.0)
MCHC: 31.1 g/dL (ref 30.0–36.0)
MCV: 89.7 fL (ref 78.0–100.0)
Platelets: 288 10*3/uL (ref 150–400)
RBC: 4.27 MIL/uL (ref 4.22–5.81)
RDW: 13.2 % (ref 11.5–15.5)
WBC: 7.5 10*3/uL (ref 4.0–10.5)

## 2014-03-19 LAB — BASIC METABOLIC PANEL
BUN: 27 mg/dL — ABNORMAL HIGH (ref 6–23)
CO2: 20 mEq/L (ref 19–32)
Calcium: 8.4 mg/dL (ref 8.4–10.5)
Chloride: 106 mEq/L (ref 96–112)
Creatinine, Ser: 1.26 mg/dL (ref 0.50–1.35)
GFR calc Af Amer: 68 mL/min — ABNORMAL LOW (ref >90)
GFR calc non Af Amer: 59 mL/min — ABNORMAL LOW (ref >90)
Glucose, Bld: 72 mg/dL (ref 70–99)
Potassium: 4.4 mEq/L (ref 3.7–5.3)
Sodium: 140 mEq/L (ref 137–147)

## 2014-03-19 LAB — LIPASE, BLOOD: Lipase: 46 U/L (ref 11–59)

## 2014-03-19 LAB — HEPATITIS PANEL, ACUTE
HCV Ab: NEGATIVE
Hep A IgM: NONREACTIVE
Hep B C IgM: NONREACTIVE
Hepatitis B Surface Ag: NEGATIVE

## 2014-03-19 LAB — TROPONIN I: Troponin I: <0.3 ng/mL (ref <0.30)

## 2014-03-19 LAB — HIV ANTIBODY (ROUTINE TESTING W REFLEX): HIV 1&2 Ab, 4th Generation: NONREACTIVE

## 2014-03-19 MED ORDER — DEXTROSE-NACL 5-0.45 % IV SOLN
INTRAVENOUS | Status: DC
  Administered 2014-03-19: 09:00:00 via INTRAVENOUS

## 2014-03-19 MED ORDER — SODIUM CHLORIDE 0.9 % IV SOLN
INTRAVENOUS | Status: AC
  Administered 2014-03-19: 15:00:00 via INTRAVENOUS

## 2014-03-19 NOTE — H&P (Signed)
INTERNAL MEDICINE TEACHING ATTENDING NOTE  Day 1 of stay  Patient name: Barry [name]  MRN: [id number] Date of birth: 07-14-1951   63 y.o.man with history of NSTEMI with PCI times 2 recently, CVA with left sided weakness, chronic mesenteric artery dissection who is admitted with abdominal pain.  I met him this morning. He reported less abdominal pain, less nausea. He has had BMs. His abdominal exam shows hyperdynamic BS, soft, no distension, tenderness to palpation in LUQ, epigastrium and RUQ.  I have reviewed labs and imaging.   I have reviewed the note by Dr Michail Sermon and agree with her management with these additions:   Abdominal Pain  Differentials may include chronic mesenteric ischemia - mesenteric artery dissection, acute pancreatitis, peptic ulcer disease, and appendicitis. Current treatment is supportive since he is not a good surgical candidate right now. Pertinent negative findings: UA is negative, lactic acid is normal, white counts are normal, troponin is normal.  We will repeat another troponin and EKG.    AKI - likely prerenal, resolving - GFR better today.   I would also start this patient on a PPI.   Hypoglycemic episodes - The patient reports sporadic hypoglycemia, his recent CBG 69, thus started on a dextrose drip.   I agree with surgery and vascular consults on this patient. Appreciate recommendations.  Agree with other recommendation for management by Dr Michail Sermon.     I have seen and evaluated this patient and discussed it with my IM resident team.  Please see the rest of the plan per resident note from today.   [place], [place] 03/19/2014, 10:59 AM.  .

## 2014-03-19 NOTE — Progress Notes (Signed)
Subjective: Patient reports abd pain much improved, still having mild abdominal pain.  He is urinating, having BM.  Is hungry but unsure about starting diet yet. Objective: Vital signs in last 24 hours: Filed Vitals:   03/18/14 1138 03/18/14 1547 03/18/14 2048 03/19/14 0440  BP: 103/70 111/70 124/71 141/73  Pulse: 60 62 61 65  Temp: 97.7 F (36.5 C) 98.3 F (36.8 C) 98.1 F (36.7 C) 99 F (37.2 C)  TempSrc: Oral Oral Oral Oral  Resp:  18 18 18   Height: 5\' 7"  (1.702 m)     Weight: 152 lb 11.2 oz (69.264 kg)   151 lb 4.8 oz (68.629 kg)  SpO2: 100% 98% 96% 97%   Weight change: 7 lb 11.2 oz (3.493 kg)  Intake/Output Summary (Last 24 hours) at 03/19/14 1121 Last data filed at 03/19/14 0841  Gross per 24 hour  Intake   2725 ml  Output   1275 ml  Net   1450 ml   General: resting in bed, multiple tattoos over body and arms, NAD Cardiac: RRR, no rubs, murmurs or gallops Pulm: clear to auscultation bilaterally, moving normal volumes of air Abd: soft, mild generalized tenderness, nondistended, BS normoactive Ext: warm and well perfused, no pedal edema  Lab Results: Basic Metabolic Panel:  Recent Labs Lab 03/18/14 0455 03/19/14 0400  NA 137 140  K 4.9 4.4  CL 99 106  CO2 23 20  GLUCOSE 107* 72  BUN 32* 27*  CREATININE 1.86* 1.26  CALCIUM 9.5 8.4   Liver Function Tests:  Recent Labs Lab 03/18/14 0455  AST 25  ALT 26  ALKPHOS 106  BILITOT 0.7  PROT 7.4  ALBUMIN 2.8*    Recent Labs Lab 03/18/14 0457  LIPASE 179*   CBC:  Recent Labs Lab 03/18/14 0455 03/19/14 0400  WBC 8.2 7.5  NEUTROABS 5.9  --   HGB 12.1* 11.9*  HCT 37.8* 38.3*  MCV 87.5 89.7  PLT 308 288   Cardiac Enzymes:  Recent Labs Lab 03/19/14 0733  TROPONINI <0.30   CBG:  Recent Labs Lab 03/18/14 1542 03/18/14 2009 03/19/14 0021 03/19/14 0438 03/19/14 0846 03/19/14 1018  GLUCAP 78 70 68* 73 69* 98   Urinalysis:  Recent Labs Lab 03/18/14 1824  COLORURINE YELLOW    LABSPEC 1.036*  PHURINE 5.0  GLUCOSEU NEGATIVE  HGBUR NEGATIVE  BILIRUBINUR NEGATIVE  KETONESUR NEGATIVE  PROTEINUR NEGATIVE  UROBILINOGEN 0.2  NITRITE NEGATIVE  LEUKOCYTESUR NEGATIVE   Micro Results: No results found for this or any previous visit (from the past 240 hour(s)). Studies/Results: Ct Abdomen Pelvis W Contrast  03/18/2014   CLINICAL DATA:  Bilateral lower quadrant abdominal pain starting 3 days ago.  EXAM: CT ABDOMEN AND PELVIS WITH CONTRAST  TECHNIQUE: Multidetector CT imaging of the abdomen and pelvis was performed using the standard protocol following bolus administration of intravenous contrast.  CONTRAST:  61mL OMNIPAQUE IOHEXOL 300 MG/ML  SOLN  COMPARISON:  12/03/2012  FINDINGS: Appendix is distended to 9 mm. There is a small amount of adjacent fluid attenuation. This appendiceal size is stable from the prior CT, suggesting that this is a normal variant for this patient. Appendicitis should be considered if there are consistent symptoms. The small amount of adjacent fluid attenuation is nonspecific. There is a small amount of pelvic free fluid.  Colon and small bowel are unremarkable.  Heart is mildly enlarged. There is dependent subsegmental atelectasis at the lung bases.  Liver, spleen, gallbladder, pancreas, adrenal glands: Normal.  Sub cm  low-density lesion in the midpole the right kidney, most likely a cyst. Kidneys otherwise unremarkable. Normal ureters. Unremarkable bladder.  There are several mildly prominent retroperitoneal and gastrohepatic ligament lymph nodes, stable from the prior exam.  Mild degenerative changes are noted throughout the visualized spine. No osteoblastic or osteolytic lesions.  IMPRESSION: 1. No definite acute findings. 2. Appendix is dilated to 9 mm, but stable in size when compared to the prior CT. This is most likely a normal variant for this patient. Small amount of adjacent fluid attenuation is likely part of the small amount of pelvic free fluid,  which is nonspecific. However, acute appendicitis should be considered if this correlates clinically. 3. Mild lung base subsegmental atelectasis. Cardiomegaly. Degenerative changes of the visualized spine.   Electronically Signed   By: Lajean Manes M.D.   On: 03/18/2014 07:13   Medications: I have reviewed the patient's current medications. Scheduled Meds: . aspirin EC  81 mg Oral Daily  . clopidogrel  75 mg Oral Q breakfast  . heparin  5,000 Units Subcutaneous 3 times per day  . insulin aspart  0-5 Units Subcutaneous QHS  . insulin aspart  0-9 Units Subcutaneous TID WC  . sodium chloride  3 mL Intravenous Q12H   Continuous Infusions: . dextrose 5 % and 0.45% NaCl 125 mL/hr at 03/19/14 0856   PRN Meds:.morphine injection, ondansetron (ZOFRAN) IV, ondansetron Assessment/Plan:   Abdominal pain, unspecified site -Abdominal pain improved with NPO, IVF.  Surgery consulted and does not suspect Appendicitis clinically, U/A not suggestive of infection, no melena to suggest peptic ulcer disease, patient may have a mild pancreatitis but would have a somewhat atypical presentation.  He is at risk for chronic mesenteric ischemia and [facility]VS is following however will need to delay angiogram due to AKI. - Repeat Lipase per surgery, will restart clear liquid diet - Continue IVF (will change back to NS at 75cc/hr)  Acute Renal Failure - Baseline appears around ~1.  Prerenal etiology with FEna <1, urine concentrated to 1.036 specific gravity. - Cr has improved from 1.8 >>1.2    Essential hypertension, benign - Hold antihypertensives due to AKI    Dissection of mesenteric artery - [facility]VS involved in care, patient at risk for chronic mesenteric ischemia, will need angiogram at some point but do not want to worsen aki.    Diastolic CHF -ASA, plavix - Monitor for volume overload    Type II or unspecified type diabetes mellitus with unspecified complication, uncontrolled - SSI  Diet: Clear liquid DVT PPx:  Heparin [place] Code : Full Dispo: Disposition is deferred at this time, awaiting improvement of current medical problems.  Anticipated discharge in approximately 1-2 day(s).   The patient does have a current PCP (Darreld Mclean, MD) and does not need an [facility] hospital follow-up appointment after discharge.  The patient does not have transportation limitations that hinder transportation to clinic appointments.  .Services Needed at time of discharge: Y = Yes, Blank = No PT:   OT:   RN:   Equipment:   Other:     LOS: 1 day   Joni Reining, DO 03/19/2014, 11:21 AM

## 2014-03-19 NOTE — Progress Notes (Signed)
Patient's blood sugar 69.  Dr. Heber Atlantis on call made aware and changed fluids to include dextrose.  See new order. Will continue to monitor patient.

## 2014-03-19 NOTE — Progress Notes (Signed)
[facility] Progress Note     Subjective: Pt's pain improved.  No N/V.  Hungry/thirsty.  According to the patient he had 2 BM's yesterday and is having flatus.  Afebrile, not tachycardic, WBC normal.  Objective: Vital signs in last 24 hours: Temp:  [97.7 F (36.5 C)-99 F (37.2 C)] 99 F (37.2 C) (06/24 0440) Pulse Rate:  [55-65] 65 (06/24 0440) Resp:  [16-18] 18 (06/24 0440) BP: (103-141)/(63-73) 141/73 mmHg (06/24 0440) SpO2:  [95 %-100 %] 97 % (06/24 0440) Weight:  [151 lb 4.8 oz (68.629 kg)-152 lb 11.2 oz (69.264 kg)] 151 lb 4.8 oz (68.629 kg) (06/24 0440) Last BM Date: 03/17/14  Intake/Output from previous day: 06/23 0701 - 06/24 0700 In: 934.3 [I.V.:934.3] Out: 775 [Urine:775] Intake/Output this shift:    PE: Gen:  Alert, NAD, pleasant Card:  RRR, no M/G/R heard Pulm:  CTA, no W/R/R Abd: Soft, mild tenderness, ND, +BS, no HSM    Lab Results:   Recent Labs  03/18/14 0455 03/19/14 0400  WBC 8.2 7.5  HGB 12.1* 11.9*  HCT 37.8* 38.3*  PLT 308 288   BMET  Recent Labs  03/18/14 0455 03/19/14 0400  NA 137 140  K 4.9 4.4  CL 99 106  CO2 23 20  GLUCOSE 107* 72  BUN 32* 27*  CREATININE 1.86* 1.26  CALCIUM 9.5 8.4   PT/INR No results found for this basename: LABPROT, INR,  in the last 72 hours CMP     Component Value Date/Time   NA 140 03/19/2014 0400   K 4.4 03/19/2014 0400   CL 106 03/19/2014 0400   CO2 20 03/19/2014 0400   GLUCOSE 72 03/19/2014 0400   BUN 27* 03/19/2014 0400   CREATININE 1.26 03/19/2014 0400   CREATININE 1.43* 08/02/2012 1947   CALCIUM 8.4 03/19/2014 0400   PROT 7.4 03/18/2014 0455   ALBUMIN 2.8* 03/18/2014 0455   AST 25 03/18/2014 0455   ALT 26 03/18/2014 0455   ALKPHOS 106 03/18/2014 0455   BILITOT 0.7 03/18/2014 0455   GFRNONAA 59* 03/19/2014 0400   GFRAA 68* 03/19/2014 0400   Lipase     Component Value Date/Time   LIPASE 179* 03/18/2014 0457       Studies/Results: Ct Abdomen Pelvis W Contrast  03/18/2014    CLINICAL DATA:  Bilateral lower quadrant abdominal pain starting 3 days ago.  EXAM: CT ABDOMEN AND PELVIS WITH CONTRAST  TECHNIQUE: Multidetector CT imaging of the abdomen and pelvis was performed using the standard protocol following bolus administration of intravenous contrast.  CONTRAST:  19mL OMNIPAQUE IOHEXOL 300 MG/ML  SOLN  COMPARISON:  12/03/2012  FINDINGS: Appendix is distended to 9 mm. There is a small amount of adjacent fluid attenuation. This appendiceal size is stable from the prior CT, suggesting that this is a normal variant for this patient. Appendicitis should be considered if there are consistent symptoms. The small amount of adjacent fluid attenuation is nonspecific. There is a small amount of pelvic free fluid.  Colon and small bowel are unremarkable.  Heart is mildly enlarged. There is dependent subsegmental atelectasis at the lung bases.  Liver, spleen, gallbladder, pancreas, adrenal glands: Normal.  Sub cm low-density lesion in the midpole the right kidney, most likely a cyst. Kidneys otherwise unremarkable. Normal ureters. Unremarkable bladder.  There are several mildly prominent retroperitoneal and gastrohepatic ligament lymph nodes, stable from the prior exam.  Mild degenerative changes are noted throughout the visualized spine. No osteoblastic or osteolytic lesions.  IMPRESSION: 1. No  definite acute findings. 2. Appendix is dilated to 9 mm, but stable in size when compared to the prior CT. This is most likely a normal variant for this patient. Small amount of adjacent fluid attenuation is likely part of the small amount of pelvic free fluid, which is nonspecific. However, acute appendicitis should be considered if this correlates clinically. 3. Mild lung base subsegmental atelectasis. Cardiomegaly. Degenerative changes of the visualized spine.   Electronically Signed   By: Lajean Manes M.D.   On: 03/18/2014 07:13    Anti-infectives: Anti-infectives   None        Assessment/Plan Abdominal pain Chronic mesenteric vessel disease Pancreatitis - lipase 179, recheck pending Dilated appendix Acute renal failure CAD s/p recent NSTEMI and PCI with DES x 2 Poorly controlled T2DM HTN  Plan: 1.  Lactic acid is normal, no WBC count, afebrile.  Continue to treat conservatively with IVF, pain control, antiemetics 2.  Appreciate vascular's recommendations 3.  Will recheck lipase if it is normal may be able to start clears and advance as tolerated 4.  Do not feel he is likely to need surgery at this point given his improvement.  Does not need appendectomy at this point.  Recommend colonoscopy at discharge.  Male family member at bedside helping to translate for [place].       LOS: 1 day    DORT, Jinny Blossom 03/19/2014, 8:07 AM Pager: [phone]

## 2014-03-19 NOTE — Progress Notes (Signed)
Do not think he has appendicitis, or needs surgery from [place] for any reason. Agree with above.  Please call back if any more questions

## 2014-03-19 NOTE — Progress Notes (Signed)
    Subjective  -   The patient reports that his abdominal pain has improved today.   Physical Exam:  Abdomen is soft.  He is tender only in the left lower quadrant on my exam.  No peritoneal signs.  Neurologically he is intact Respirations are nonlabored       Assessment/Plan:  Chronic superior mesenteric artery dissection  I initially met this patient in 2014.  At that time, a CT scan showed a superior mesenteric artery dissection.  He was therefore abdominal pain and was diagnosed as having epiploic appendagitis involving his sigmoid colon.  I saw back in the office with a CT angiogram.  This better elucidated the mesenteric dissection.  His abdominal pain had essentially resolved.  I felt that his dissection was a chronic finding and unrelated to his acute problem at that time.  At his followup in March of 2015, he again had abdominal pain which was relieved by walking and narcotics.  He describes right lower quadrant pain with eating which stopped after eating.  He occasionally would have pain without eating.  It has been progressively getting worse over the past 5 years.  After reviewing the patient's CT scan from this hospitalization, I do not see any acute changes within his dissection.  Certainly, he is at risk for chronic mesenteric ischemia.  However with his recent cardiac events he would certainly be high risk for an intervention at this time.  If his kidney function permits, he may benefit from a CT angiogram for comparison to see if there been any interval changes in the perfusion of his bowel.  I do not see any acute indications for intervention at this time.  His lactate was within normal limits yesterday.  Stewart Pimenta IV, VRock Nephew 03/19/2014 10:57 AM --  Filed Vitals:   03/19/14 0440  BP: 141/73  Pulse: 65  Temp: 99 F (37.2 C)  Resp: 18    Intake/Output Summary (Last 24 hours) at 03/19/14 1057 Last data filed at 03/19/14 0841  Gross per 24 hour  Intake   2725  ml  Output   1275 ml  Net   1450 ml     Laboratory CBC    Component Value Date/Time   WBC 7.5 03/19/2014 0400   WBC 8.0 08/02/2012 1955   HGB 11.9* 03/19/2014 0400   HGB 13.8* 08/02/2012 1955   HCT 38.3* 03/19/2014 0400   HCT 44.7 08/02/2012 1955   PLT 288 03/19/2014 0400    BMET    Component Value Date/Time   NA 140 03/19/2014 0400   K 4.4 03/19/2014 0400   CL 106 03/19/2014 0400   CO2 20 03/19/2014 0400   GLUCOSE 72 03/19/2014 0400   BUN 27* 03/19/2014 0400   CREATININE 1.26 03/19/2014 0400   CREATININE 1.43* 08/02/2012 1947   CALCIUM 8.4 03/19/2014 0400   GFRNONAA 59* 03/19/2014 0400   GFRAA 68* 03/19/2014 0400    COAG Lab Results  Component Value Date   INR 1.14 03/08/2014   No results found for this basename: PTT    Antibiotics Anti-infectives   None       V. Leia Alf, M.D. [facility] Office: [phone] Pager:  [phone]

## 2014-03-20 DIAGNOSIS — I509 Heart failure, unspecified: Secondary | ICD-10-CM

## 2014-03-20 DIAGNOSIS — E1165 Type 2 diabetes mellitus with hyperglycemia: Secondary | ICD-10-CM

## 2014-03-20 DIAGNOSIS — I10 Essential (primary) hypertension: Secondary | ICD-10-CM | POA: Diagnosis not present

## 2014-03-20 DIAGNOSIS — N179 Acute kidney failure, unspecified: Secondary | ICD-10-CM | POA: Diagnosis not present

## 2014-03-20 DIAGNOSIS — I5030 Unspecified diastolic (congestive) heart failure: Secondary | ICD-10-CM

## 2014-03-20 DIAGNOSIS — IMO0001 Reserved for inherently not codable concepts without codable children: Secondary | ICD-10-CM

## 2014-03-20 LAB — BASIC METABOLIC PANEL
BUN: 14 mg/dL (ref 6–23)
CO2: 17 mEq/L — ABNORMAL LOW (ref 19–32)
Calcium: 8.5 mg/dL (ref 8.4–10.5)
Chloride: 104 mEq/L (ref 96–112)
Creatinine, Ser: 1 mg/dL (ref 0.50–1.35)
GFR calc Af Amer: >90 mL/min (ref >90)
GFR calc non Af Amer: 85 mL/min — ABNORMAL LOW (ref >90)
Glucose, Bld: 167 mg/dL — ABNORMAL HIGH (ref 70–99)
Potassium: 4.7 mEq/L (ref 3.7–5.3)
Sodium: 136 mEq/L — ABNORMAL LOW (ref 137–147)

## 2014-03-20 LAB — URINALYSIS, ROUTINE W REFLEX MICROSCOPIC
Bilirubin Urine: NEGATIVE
Glucose, UA: NEGATIVE mg/dL
Ketones, ur: NEGATIVE mg/dL
Leukocytes, UA: NEGATIVE
Nitrite: NEGATIVE
Protein, ur: NEGATIVE mg/dL
Specific Gravity, Urine: 1.017 (ref 1.005–1.030)
Urobilinogen, UA: 2 mg/dL — ABNORMAL HIGH (ref 0.0–1.0)
pH: 5.5 (ref 5.0–8.0)

## 2014-03-20 LAB — GLUCOSE, CAPILLARY
Glucose-Capillary: 101 mg/dL — ABNORMAL HIGH (ref 70–99)
Glucose-Capillary: 111 mg/dL — ABNORMAL HIGH (ref 70–99)
Glucose-Capillary: 174 mg/dL — ABNORMAL HIGH (ref 70–99)

## 2014-03-20 LAB — URINE MICROSCOPIC-ADD ON

## 2014-03-20 NOTE — Discharge Instructions (Signed)
Please follow up with your Primary Care physician at [facility].  I want you to walk in to be seen on Monday 6/29,  You will also need to follow up with your cardiologist on 03/31/14 at 2:40pm.  You will need to also call the Vascular Surgeon Dr. Trula Slade to make a follow up appointment.  Please take Protnoix (Pantoprazole 40mg ) daily.

## 2014-03-20 NOTE — Progress Notes (Signed)
    Day 2 of stay      Patient name: Barry [name]  Medical record number: [id number]  Date of birth: 1951/05/04   Patient feels well, had regular diet with no problem.   Filed Vitals:   03/19/14 1514 03/19/14 2100 03/20/14 0639 03/20/14 0854  BP: 117/65 127/71 132/75 145/75  Pulse: 63 56 62 59  Temp: 98.8 F (37.1 C) 99.7 F (37.6 C) 98.4 F (36.9 C)   TempSrc: Oral Oral Oral   Resp: 16 18 17 18   Height:      Weight:   156 lb 8.4 oz (71 kg)   SpO2: 99% 98% 97% 100%   Physical exam significant for benign abdomen with no tenderness to palpation. Agree with Dr Jodene Nam exam.   Assessment and Plan    Reviewed labs - normalized, AKI resolved. Abdominal pain resolved. Patient can be discharged with PCP and vascular surgery follow up for angiogram as suggested.   I have discussed the care of this patient with my IM team residents - Dr Heber Arthur and Dr Hayes Ludwig. Please see the resident note for details.  [place], [place] 03/20/2014, 11:40 AM.

## 2014-03-20 NOTE — Discharge Summary (Signed)
Name: Barry [name] MRN: [id number] DOB: 02-May-1951 63 y.o. PCP: Darreld Mclean, MD  Date of Admission: 03/18/2014  4:47 AM Date of Discharge: 03/20/2014 Attending Physician: Madilyn Fireman, MD  Discharge Diagnosis: Principal Problem:   Abdominal pain, unspecified site Active Problems:   Language barrier   Essential hypertension, benign   Dissection of mesenteric artery   Weakness-Left arm / Lef   Diastolic CHF   Type II or unspecified type diabetes mellitus with unspecified complication, uncontrolled   Dyslipidemia   Aneurysm of iliac artery  Discharge Medications:   Medication List         aspirin 81 MG EC tablet  Take 1 tablet (81 mg total) by mouth daily.     atorvastatin 80 MG tablet  Commonly known as:  LIPITOR  Take 1 tablet (80 mg total) by mouth daily at 6 PM.     clopidogrel 75 MG tablet  Commonly known as:  PLAVIX  Take 1 tablet (75 mg total) by mouth daily with breakfast.     diphenhydrAMINE 25 MG tablet  Commonly known as:  BENADRYL  Take 50 mg by mouth daily as needed for allergies.     lisinopril 20 MG tablet  Commonly known as:  ZESTRIL  Take 1 tablet (20 mg total) by mouth daily.     metFORMIN 500 MG tablet  Commonly known as:  GLUCOPHAGE  Take 1 tablet (500 mg total) by mouth 2 (two) times daily with a meal.     metoprolol tartrate 25 MG tablet  Commonly known as:  LOPRESSOR  Take 12.5 mg by mouth 2 (two) times daily.     nitroGLYCERIN 0.4 MG SL tablet  Commonly known as:  NITROSTAT  Place 1 tablet (0.4 mg total) under the tongue every 5 (five) minutes as needed for chest pain.     OVER THE COUNTER MEDICATION  Take 1 tablet by mouth daily. Natural E Complete from [employer] - contains Vitamin E 30 units and selenium 10 mcg     pantoprazole 40 MG tablet  Commonly known as:  PROTONIX  Take 1 tablet (40 mg total) by mouth daily.        Disposition and follow-up:   Mr.Charleton Funari was discharged from [facility] in Stable  condition.  At the hospital follow up visit please address:  1.  Recurrence of abdominal pain.  2. Patient will need to follow up with Cardiology, appointment scheduled  3.  Patient will need to follow up with Dr. Trula Slade for mesenteric artery dissection  2.  Labs / imaging needed at time of follow-up: BMP  3.  Pending labs/ test needing follow-up: None  Follow-up Appointments:     Follow-up Information   Follow up with Lamar Blinks, MD In 3 days. (please arrive for walk in appointment for hospital follow up)    Specialty:  Family Medicine   Contact information:   [place] [address] [phone]       Follow up with Richardson Dopp, PA-C On 03/31/2014. (at 2:40pm)    Specialty:  Physician Assistant   Contact information:   [address] [place] [address] [phone]       Follow up with Eldridge Abrahams, MD. Schedule an appointment as soon as possible for a visit in 1 month. (for follow up.)    Specialty:  Vascular Surgery   Contact information:   [address] [place] [address] [phone]  Discharge Instructions: Discharge Instructions   Diet - low sodium heart healthy    Complete by:  As directed      Increase activity slowly    Complete by:  As directed            Consultations: Treatment Team:  Elam Dutch, MD Serafina Mitchell, MD  Procedures Performed:  Dg Chest 2 View  03/10/2014   CLINICAL DATA:  Right basilar airspace opacity  EXAM: CHEST  2 VIEW  COMPARISON:  03/08/2014  FINDINGS: Low lung volumes. Mild bilateral interstitial prominence likely related to low lung volumes. There is no focal parenchymal opacity, pleural effusion, or pneumothorax. The heart and mediastinal contours are unremarkable.  The osseous structures are unremarkable.  IMPRESSION: No active cardiopulmonary disease.   Electronically Signed   By: Kathreen Devoid   On: 03/10/2014 18:14   Dg Chest 2 View  03/09/2014   CLINICAL  DATA:  Fever.  EXAM: CHEST  2 VIEW  COMPARISON:  Chest radiograph performed 03/07/2014  FINDINGS: The lungs are well-aerated. Minimal right basilar opacity may reflect atelectasis or possibly mild pneumonia. There is no evidence of pleural effusion or pneumothorax.  The heart is borderline normal in size; the mediastinal contour is within normal limits. Lines projecting to the right of the trachea appear to be outside the patient. No acute osseous abnormalities are seen.  IMPRESSION: Minimal right basilar airspace opacity may reflect atelectasis or possibly mild pneumonia.   Electronically Signed   By: Garald Balding M.D.   On: 03/09/2014 04:38   Ct Head Wo Contrast  02/26/2014   CLINICAL DATA:  Chest pain.  Leg pain.  EXAM: CT HEAD WITHOUT CONTRAST  TECHNIQUE: Contiguous axial images were obtained from the base of the skull through the vertex without intravenous contrast.  COMPARISON:  CT head without contrast 09/30/2009  FINDINGS: No acute cortical infarct, hemorrhage, or mass lesion is present. The ventricles are of normal size. No significant extra-axial fluid collection is evident. The paranasal sinuses and mastoid air cells are clear. The osseous skull is intact.  IMPRESSION: Negative CT of the head.   Electronically Signed   By: Lawrence Santiago M.D.   On: 02/26/2014 14:15   Ct Angio Chest Pe W/cm &/or Wo Cm  03/07/2014   CLINICAL DATA:  Chest pain and syncope.  EXAM: CT ANGIOGRAPHY CHEST WITH CONTRAST  TECHNIQUE: Multidetector CT imaging of the chest was performed using the standard protocol during bolus administration of intravenous contrast. Multiplanar CT image reconstructions and MIPs were obtained to evaluate the vascular anatomy.  CONTRAST:  188mL OMNIPAQUE IOHEXOL 350 MG/ML SOLN  COMPARISON:  CT 02/26/2014.  FINDINGS: Thoracic aorta e ectasia and atherosclerotic. No dissection noted. Intimal thickening and/or thrombus noted within the lumen along the wall of the thoracic aorta is stable and  unchanged. Pulmonary arteries are normal. No pulmonary embolus. Cardiomegaly. Coronary artery disease.  Shotty stable mediastinal lymph nodes are noted . Small hilar lymph nodes are unchanged. Thoracic esophagus nondistended.  Large airways patent. Mild basilar atelectasis present. No focal alveolar infiltrates. No pleural effusion or pneumothorax.  Visualized upper abdominal viscera unremarkable.  Visualized thyroid unremarkable. Multiple nonspecific axillary lymph nodes are present. Chest wall is intact. Diffuse degenerative changes thoracic spine. No acute bony abnormality identified.  Review of the MIP images confirms the above findings.  IMPRESSION: 1.  No evidence of pulmonary embolus.  2. Cardiomegaly.  Coronary artery disease.   Electronically Signed   By: Marcello Moores  Register  On: 03/07/2014 19:06   Ct Abdomen Pelvis W Contrast  03/18/2014   CLINICAL DATA:  Bilateral lower quadrant abdominal pain starting 3 days ago.  EXAM: CT ABDOMEN AND PELVIS WITH CONTRAST  TECHNIQUE: Multidetector CT imaging of the abdomen and pelvis was performed using the standard protocol following bolus administration of intravenous contrast.  CONTRAST:  55mL OMNIPAQUE IOHEXOL 300 MG/ML  SOLN  COMPARISON:  12/03/2012  FINDINGS: Appendix is distended to 9 mm. There is a small amount of adjacent fluid attenuation. This appendiceal size is stable from the prior CT, suggesting that this is a normal variant for this patient. Appendicitis should be considered if there are consistent symptoms. The small amount of adjacent fluid attenuation is nonspecific. There is a small amount of pelvic free fluid.  Colon and small bowel are unremarkable.  Heart is mildly enlarged. There is dependent subsegmental atelectasis at the lung bases.  Liver, spleen, gallbladder, pancreas, adrenal glands: Normal.  Sub cm low-density lesion in the midpole the right kidney, most likely a cyst. Kidneys otherwise unremarkable. Normal ureters. Unremarkable bladder.   There are several mildly prominent retroperitoneal and gastrohepatic ligament lymph nodes, stable from the prior exam.  Mild degenerative changes are noted throughout the visualized spine. No osteoblastic or osteolytic lesions.  IMPRESSION: 1. No definite acute findings. 2. Appendix is dilated to 9 mm, but stable in size when compared to the prior CT. This is most likely a normal variant for this patient. Small amount of adjacent fluid attenuation is likely part of the small amount of pelvic free fluid, which is nonspecific. However, acute appendicitis should be considered if this correlates clinically. 3. Mild lung base subsegmental atelectasis. Cardiomegaly. Degenerative changes of the visualized spine.   Electronically Signed   By: Lajean Manes M.D.   On: 03/18/2014 07:13   Dg Chest Portable 1 View  03/07/2014   CLINICAL DATA:  Syncope.  Chest pain.  EXAM: PORTABLE CHEST - 1 VIEW  COMPARISON:  02/26/2014  FINDINGS: Low lung volumes. Heart size is accentuated by the low volumes an AP portable nature of the study. No confluent opacities or effusions. No acute bony abnormality.  IMPRESSION: Low lung volumes.  No active disease.   Electronically Signed   By: Rolm Baptise M.D.   On: 03/07/2014 18:57   Dg Chest Portable 1 View  02/26/2014   CLINICAL DATA:  Chest pain.  EXAM: PORTABLE CHEST - 1 VIEW  COMPARISON:  None.  FINDINGS: Heart size is exaggerated by low lung volumes. Pulmonary vascular congestion is evident. No focal airspace disease is present.  IMPRESSION: 1. Low lung volumes and mild pulmonary vascular congestion. 2. No focal airspace disease.   Electronically Signed   By: Lawrence Santiago M.D.   On: 02/26/2014 13:30   Ct Angio Chest Aortic Dissect W &/or W/o  02/26/2014   CLINICAL DATA:  h/o dissection, mid and left-sided chest pain, hypertensive  EXAM: CT ANGIOGRAPHY CHEST, ABDOMEN AND PELVIS  TECHNIQUE: Multidetector CT imaging through the chest, abdomen and pelvis was performed using the standard  protocol during bolus administration of intravenous contrast. Multiplanar reconstructed images including MIPs were obtained and reviewed to evaluate the vascular anatomy.  CONTRAST:  142mL OMNIPAQUE IOHEXOL 350 MG/ML SOLN  COMPARISON:  12/03/2012  FINDINGS: CTA CHEST FINDINGS  The noncontrast scout shows no hyperdense crescent, mediastinal hematoma, pleural or pericardial effusion. Patchy coronary calcifications and aortic arch calcifications are noted.  CTA reveals no dissection or stenosis. Classic 3 vessel brachiocephalic arterial origin anatomy without  proximal lesion. There is mild plaque in the distal arch and descending segment. . No significant intramural thrombus. Satisfactory opacification of pulmonary arteries noted, and there is no evidence of pulmonary emboli.  Dependent atelectasis posteriorly in both lungs. No confluent airspace consolidation.  Review of the MIP images confirms the above findings. Thoracic spine and sternum unremarkable.  CTA ABDOMEN AND PELVIS FINDINGS  Arterial findings:  Aorta: Mild scattered eccentric plaque. There is a focal ulceration just proximal to the bifurcation. No aneurysm, dissection, or stenosis.  Celiac axis: Short segment high-grade stenosis just beyond the origin at the level of the median arcuate ligament of the diaphragm, patent distally.  Superior mesenteric: Dissection in the proximal SMA, the false lumen thrombosed for length of 3.3 cm from its origin, perfused distally, extending over a length of approximately 4 cm, with some aneurysmal dilatation up to 13 mm diameter. No extension into branch vessels. Short segment extension into the middle colic branch as before. Stable appearance from prior study.  Left renal:          Single, patent.  Right renal:         Single, patent.  Inferior mesenteric: Short segment origin stenosis related to aortic wall plaque, patent distally.  Left iliac: Ectatic, 17 mm diameter, with some eccentric plaque in the common iliac.  There is mild narrowing in the proximal internal iliac artery, patent distally. The external iliac is mildly tortuous, otherwise unremarkable.  Right iliac: Mild nonocclusive plaque through the common iliac and in the proximal internal iliac. External iliac widely patent, mildly tortuous.  Venous findings:     Dedicated venous phase imaging not obtained.  Review of the MIP images confirms the above findings.  Nonvascular findings: Unremarkable arterial phase evaluation of liver, spleen, adrenal glands, pancreas. Gallbladder is incompletely distended. Tiny bilateral renal cysts. Stomach distended by ingested material. Small bowel and colon are nondilated. Appendix not identified. No ascites. No free air. No adenopathy. Mild prostatic enlargement. Regional bones unremarkable.  IMPRESSION: 1. No aortic  dissection or other acute abnormality. 2. 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 3. Stable 17 mm ectasia of the left common iliac artery. 4. Atherosclerosis, including aortoiliac and coronary artery disease. Please note that although the presence of coronary artery calcium documents the presence of coronary artery disease, the severity of this disease and any potential stenosis cannot be assessed on this non-gated CT examination. Assessment for potential risk factor modification, dietary therapy or pharmacologic therapy may be warranted, if clinically indicated.   Electronically Signed   By: Arne Cleveland M.D.   On: 02/26/2014 14:37   Ct Angio Abd/pel W/ And/or W/o  02/26/2014   CLINICAL DATA:  h/o dissection, mid and left-sided chest pain, hypertensive  EXAM: CT ANGIOGRAPHY CHEST, ABDOMEN AND PELVIS  TECHNIQUE: Multidetector CT imaging through the chest, abdomen and pelvis was performed using the standard protocol during bolus administration of intravenous contrast. Multiplanar reconstructed images including MIPs were obtained and reviewed to evaluate the vascular anatomy.  CONTRAST:  12mL OMNIPAQUE  IOHEXOL 350 MG/ML SOLN  COMPARISON:  12/03/2012  FINDINGS: CTA CHEST FINDINGS  The noncontrast scout shows no hyperdense crescent, mediastinal hematoma, pleural or pericardial effusion. Patchy coronary calcifications and aortic arch calcifications are noted.  CTA reveals no dissection or stenosis. Classic 3 vessel brachiocephalic arterial origin anatomy without proximal lesion. There is mild plaque in the distal arch and descending segment. . No significant intramural thrombus. Satisfactory opacification of pulmonary arteries noted, and there is no evidence  of pulmonary emboli.  Dependent atelectasis posteriorly in both lungs. No confluent airspace consolidation.  Review of the MIP images confirms the above findings. Thoracic spine and sternum unremarkable.  CTA ABDOMEN AND PELVIS FINDINGS  Arterial findings:  Aorta: Mild scattered eccentric plaque. There is a focal ulceration just proximal to the bifurcation. No aneurysm, dissection, or stenosis.  Celiac axis: Short segment high-grade stenosis just beyond the origin at the level of the median arcuate ligament of the diaphragm, patent distally.  Superior mesenteric: Dissection in the proximal SMA, the false lumen thrombosed for length of 3.3 cm from its origin, perfused distally, extending over a length of approximately 4 cm, with some aneurysmal dilatation up to 13 mm diameter. No extension into branch vessels. Short segment extension into the middle colic branch as before. Stable appearance from prior study.  Left renal:          Single, patent.  Right renal:         Single, patent.  Inferior mesenteric: Short segment origin stenosis related to aortic wall plaque, patent distally.  Left iliac: Ectatic, 17 mm diameter, with some eccentric plaque in the common iliac. There is mild narrowing in the proximal internal iliac artery, patent distally. The external iliac is mildly tortuous, otherwise unremarkable.  Right iliac: Mild nonocclusive plaque through the common  iliac and in the proximal internal iliac. External iliac widely patent, mildly tortuous.  Venous findings:     Dedicated venous phase imaging not obtained.  Review of the MIP images confirms the above findings.  Nonvascular findings: Unremarkable arterial phase evaluation of liver, spleen, adrenal glands, pancreas. Gallbladder is incompletely distended. Tiny bilateral renal cysts. Stomach distended by ingested material. Small bowel and colon are nondilated. Appendix not identified. No ascites. No free air. No adenopathy. Mild prostatic enlargement. Regional bones unremarkable.  IMPRESSION: 1. No aortic  dissection or other acute abnormality. 2. 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 3. Stable 17 mm ectasia of the left common iliac artery. 4. Atherosclerosis, including aortoiliac and coronary artery disease. Please note that although the presence of coronary artery calcium documents the presence of coronary artery disease, the severity of this disease and any potential stenosis cannot be assessed on this non-gated CT examination. Assessment for potential risk factor modification, dietary therapy or pharmacologic therapy may be warranted, if clinically indicated.   Electronically Signed   By: Arne Cleveland M.D.   On: 02/26/2014 14:37   Admission HPI: Mr. Bowermaster is a 63 yo non-English speaking [place] man who presented to [facility] ED with complaints of abdominal pain. His history is significant for NSTEMI with PCI to RCA with drug eluting stent (02/26/14), ~1 week after hospital discharge he was re-admitted (03/07/2014) with recurrent chest pain, nausea, and vomiting requiring repeat cardiac catheterization with subsequent DES placed to mid left circumflex artery . His other medical history includes hypertension, Diabetes Mellitus Type II, prior CVA with mild left-sided weakness, chronic mesenteric artery dissection, and diastolic CHF. Patient speaks limited [place]. Wife and grandson at bedside assisting with  translation and providing much of the history. They state that since his last hospitalization discharge 6 days ago, he has been compliant with all medications, able to ambulate with minimal assistance, eating soft foods such as rice and soups. He developed abdominal pain 3 days ago followed by and nausea 2 days ago. He has had 3 bowel movements since discharge which have been small per his wife report but no diarrhea, blood, mucus or dark coloration. He reports  some pain on urination but denies burning or strong smell. The wife states that the patient continues to sweat and have chills "all the time" even during his prior hospitalization which prevent him from sleeping well. He normally drinks 1-3 beers daily but has not had any since hospital discharge. Denies chest pain, no shortness of breath, no confusion, no syncope. His blood sugar has been in the "120s" per wife report. IMTS has been asked to admit patient for evaluation of acute pancreatitis given abdominal pain and lipase of 179.  No tobacco for decades.   Hospital Course by problem list:   Abdominal pain/ Possible Acute pancreatitis/  Dissection of mesenteric artery - Patient presented with chief complaint of abdominal pain, on evaluation in the ED he had an elevated lipase of 179 indicating possible acute pancreatitis.  However of concern CT of the abdomen was obtained and showed a dilated but stable appearing appendix and stable mesenteric artery dissection.  A lactic acid was checked and was wnl which was reassuring that he was not experiencing acute mesenteric ischemia.  General surgery was consulted and evaluated the patient, he did not have other clinical features that were consistent with appendicitis (no fever, leukocytosis, and non focal exam).  Vascular surgery was also consulted and felt that he was at risk for chronic mesenteric ischemia but did not have typical features (no food fear) a CT angiogram would be recommended however this was  delayed given his acute kidney disease.  Patient was placed NPO and treated with IVF for possible pancreatitis and his pain improved greatly.  A lipase was rechecked and was lower than presentation and his diet was advanced until he was tolerating a full diet.  Given that his symptoms resolved a CT angiogram was not persued at this time and he was instructed to follow up with Vascular surgery as an outpatient.  Acute Renal Failure  - Patient with baseline creatinine around ~1mg /dL. He presented with a Cr of 1.8.  History and labwork was consistent with a prerenal etiology with FEna <1, urine concentrated to 1.036 specific gravity. He was treated with IVF and his creatine trended down to baseline by discharge.  His home antihypertensive medications were held while inpatient.    Chronic Diastolic CHF - Patient with history of dCHF.  He was treated with careful IV resuscitation for his acute renal failure.    Type II or unspecified type diabetes mellitus with unspecified complication, uncontrolled - His home medications were held and with was treated with a sliding scale insulin given his limited PO intake.   Discharge Vitals:   BP 145/75  Pulse 59  Temp(Src) 98.4 F (36.9 C) (Oral)  Resp 18  Ht 5\' 7"  (1.702 m)  Wt 156 lb 8.4 oz (71 kg)  BMI 24.51 kg/m2  SpO2 100%  Discharge Labs:  Results for orders placed during the hospital encounter of 03/18/14 (from the past 24 hour(s))  GLUCOSE, CAPILLARY     Status: Abnormal   Collection Time    03/19/14  4:28 PM      Result Value Ref Range   Glucose-Capillary 189 (*) 70 - 99 mg/dL   Comment 1 Documented in Chart     Comment 2 Notify RN    GLUCOSE, CAPILLARY     Status: Abnormal   Collection Time    03/19/14  8:54 PM      Result Value Ref Range   Glucose-Capillary 127 (*) 70 - 99 mg/dL   Comment 1 Notify RN  GLUCOSE, CAPILLARY     Status: Abnormal   Collection Time    03/20/14 12:05 AM      Result Value Ref Range   Glucose-Capillary  111 (*) 70 - 99 mg/dL   Comment 1 Notify RN    GLUCOSE, CAPILLARY     Status: Abnormal   Collection Time    03/20/14  6:00 AM      Result Value Ref Range   Glucose-Capillary 101 (*) 70 - 99 mg/dL  BASIC METABOLIC PANEL     Status: Abnormal   Collection Time    03/20/14  9:10 AM      Result Value Ref Range   Sodium 136 (*) 137 - 147 mEq/L   Potassium 4.7  3.7 - 5.3 mEq/L   Chloride 104  96 - 112 mEq/L   CO2 17 (*) 19 - 32 mEq/L   Glucose, Bld 167 (*) 70 - 99 mg/dL   BUN 14  6 - 23 mg/dL   Creatinine, Ser 1.00  0.50 - 1.35 mg/dL   Calcium 8.5  8.4 - 10.5 mg/dL   GFR calc non Af Amer 85 (*) >90 mL/min   GFR calc Af Amer >90  >90 mL/min  GLUCOSE, CAPILLARY     Status: Abnormal   Collection Time    03/20/14 11:15 AM      Result Value Ref Range   Glucose-Capillary 174 (*) 70 - 99 mg/dL   Comment 1 Notify RN      Signed: Joni Reining, DO 03/20/2014, 1:11 PM    Services Ordered on Discharge: [place] Equipment Ordered on Discharge: none

## 2014-03-20 NOTE — Progress Notes (Signed)
Discharge instructions  Given to pt and son . Both verbalized understanding. Wheeled to lobby by NT @ [phone]

## 2014-03-20 NOTE — Progress Notes (Signed)
Angiogram not necessary if abdominal pain resolved.  Will have patient keep already scheduled follow up appointment    Barry [name]

## 2014-03-20 NOTE — Progress Notes (Signed)
Subjective: Patient reports abdominal pain has resolved, wants to eat a regular diet, walk around and go home today. Objective: Vital signs in last 24 hours: Filed Vitals:   03/19/14 1514 03/19/14 2100 03/20/14 0639 03/20/14 0854  BP: 117/65 127/71 132/75 145/75  Pulse: 63 56 62 59  Temp: 98.8 F (37.1 C) 99.7 F (37.6 C) 98.4 F (36.9 C)   TempSrc: Oral Oral Oral   Resp: 16 18 17 18   Height:      Weight:   156 lb 8.4 oz (71 kg)   SpO2: 99% 98% 97% 100%   Weight change: 3 lb 13.2 oz (1.736 kg)  Intake/Output Summary (Last 24 hours) at 03/20/14 0858 Last data filed at 03/20/14 0647  Gross per 24 hour  Intake 1823.75 ml  Output   1025 ml  Net 798.75 ml   General: resting in bed, NAD Cardiac: RRR, no rubs, murmurs or gallops Pulm: CTAB Abd: soft, nontender, nondistended, BS normoactive Ext: warm and well perfused, no pedal edema  Lab Results: Basic Metabolic Panel:  Recent Labs Lab 03/18/14 0455 03/19/14 0400  NA 137 140  K 4.9 4.4  CL 99 106  CO2 23 20  GLUCOSE 107* 72  BUN 32* 27*  CREATININE 1.86* 1.26  CALCIUM 9.5 8.4   Liver Function Tests:  Recent Labs Lab 03/18/14 0455  AST 25  ALT 26  ALKPHOS 106  BILITOT 0.7  PROT 7.4  ALBUMIN 2.8*    Recent Labs Lab 03/18/14 0457 03/19/14 0733  LIPASE 179* 46   CBC:  Recent Labs Lab 03/18/14 0455 03/19/14 0400  WBC 8.2 7.5  NEUTROABS 5.9  --   HGB 12.1* 11.9*  HCT 37.8* 38.3*  MCV 87.5 89.7  PLT 308 288   Cardiac Enzymes:  Recent Labs Lab 03/19/14 0733  TROPONINI <0.30   CBG:  Recent Labs Lab 03/19/14 1018 03/19/14 1204 03/19/14 1628 03/19/14 2054 03/20/14 0005 03/20/14 0600  GLUCAP 98 119* 189* 127* 111* 101*   Urinalysis:  Recent Labs Lab 03/18/14 1824  COLORURINE YELLOW  LABSPEC 1.036*  PHURINE 5.0  GLUCOSEU NEGATIVE  HGBUR NEGATIVE  BILIRUBINUR NEGATIVE  KETONESUR NEGATIVE  PROTEINUR NEGATIVE  UROBILINOGEN 0.2  NITRITE NEGATIVE  LEUKOCYTESUR NEGATIVE    Micro Results: Recent Results (from the past 240 hour(s))  URINE CULTURE     Status: None   Collection Time    03/18/14  6:24 PM      Result Value Ref Range Status   Specimen Description URINE, CLEAN CATCH   Final   Special Requests NONE   Final   Culture  Setup Time     Final   Value: 03/18/2014 19:52     Performed at [facility]     Final   Value: NO GROWTH     Performed at [facility]   Culture     Final   Value: NO GROWTH     Performed at [facility]   Report Status 03/19/2014 FINAL   Final   Studies/Results: No results found. Medications: I have reviewed the patient's current medications. Scheduled Meds: . aspirin EC  81 mg Oral Daily  . clopidogrel  75 mg Oral Q breakfast  . heparin  5,000 Units Subcutaneous 3 times per day  . insulin aspart  0-5 Units Subcutaneous QHS  . insulin aspart  0-9 Units Subcutaneous TID WC  . sodium chloride  3 mL Intravenous Q12H   Continuous Infusions:  PRN Meds:.morphine injection, ondansetron (ZOFRAN) IV, ondansetron Assessment/Plan:   Abdominal pain- resolved -  Patient may have a mild pancreatitis.  He is at risk for chronic mesenteric ischemia and [facility] is following however if he is able to tolerate a full diet he can likely be discharged and angiogram completed as outpatient. - Advance diet to Heart Healthy  Acute Renal Failure - Baseline appears around ~1.  Prerenal etiology with FEna <1, urine concentrated to 1.036 specific gravity. - Cr has improved from 1.8 >>1.2>>1 -Cr stable at baseline    Essential hypertension, benign - Patient mostly normotensive overnight.  Can restart antihypertensives on discharge.    Dissection of mesenteric artery - [facility] involved in care, patient at risk for chronic mesenteric ischemia, will need angiogram at some point.    Diastolic CHF -ASA, plavix - Monitor for volume overload    Type II or unspecified type diabetes mellitus with unspecified  complication, uncontrolled - CBGs mostly controlled -SSI  Diet: Heart DVT PPx: Heparin [place] Code : Full Dispo: Patient can likely be discharged home today if tolerates regular diet and OK with [facility].  The patient does have a current PCP (Gay Filler Copland, MD) and does not need an [facility] hospital follow-up appointment after discharge.  The patient does not have transportation limitations that hinder transportation to clinic appointments.  .Services Needed at time of discharge: Y = Yes, Blank = No PT:   OT:   RN:   Equipment:   Other:     LOS: 2 days   Joni Reining, DO 03/20/2014, 8:58 AM

## 2014-03-21 DIAGNOSIS — I7779 Dissection of other artery: Secondary | ICD-10-CM | POA: Diagnosis not present

## 2014-03-21 DIAGNOSIS — I2510 Atherosclerotic heart disease of native coronary artery without angina pectoris: Secondary | ICD-10-CM | POA: Diagnosis not present

## 2014-03-21 DIAGNOSIS — I5032 Chronic diastolic (congestive) heart failure: Secondary | ICD-10-CM | POA: Diagnosis not present

## 2014-03-21 DIAGNOSIS — I214 Non-ST elevation (NSTEMI) myocardial infarction: Secondary | ICD-10-CM | POA: Diagnosis not present

## 2014-03-21 DIAGNOSIS — I69959 Hemiplegia and hemiparesis following unspecified cerebrovascular disease affecting unspecified side: Secondary | ICD-10-CM | POA: Diagnosis not present

## 2014-03-21 DIAGNOSIS — E119 Type 2 diabetes mellitus without complications: Secondary | ICD-10-CM | POA: Diagnosis not present

## 2014-03-21 DIAGNOSIS — I714 Abdominal aortic aneurysm, without rupture, unspecified: Secondary | ICD-10-CM | POA: Diagnosis not present

## 2014-03-21 DIAGNOSIS — I509 Heart failure, unspecified: Secondary | ICD-10-CM | POA: Diagnosis not present

## 2014-03-21 LAB — URINE CULTURE
Colony Count: NO GROWTH
Culture: NO GROWTH

## 2014-03-24 ENCOUNTER — Ambulatory Visit (INDEPENDENT_AMBULATORY_CARE_PROVIDER_SITE_OTHER): Payer: Medicare Other | Admitting: Family Medicine

## 2014-03-24 VITALS — BP 123/72 | HR 62 | Temp 97.8°F | Resp 18 | Ht 65.5 in | Wt 156.0 lb

## 2014-03-24 DIAGNOSIS — K859 Acute pancreatitis without necrosis or infection, unspecified: Secondary | ICD-10-CM | POA: Diagnosis not present

## 2014-03-24 DIAGNOSIS — K858 Other acute pancreatitis without necrosis or infection: Secondary | ICD-10-CM

## 2014-03-24 DIAGNOSIS — IMO0002 Reserved for concepts with insufficient information to code with codable children: Secondary | ICD-10-CM

## 2014-03-24 DIAGNOSIS — E1165 Type 2 diabetes mellitus with hyperglycemia: Secondary | ICD-10-CM

## 2014-03-24 DIAGNOSIS — E118 Type 2 diabetes mellitus with unspecified complications: Principal | ICD-10-CM

## 2014-03-24 DIAGNOSIS — R1013 Epigastric pain: Secondary | ICD-10-CM

## 2014-03-24 DIAGNOSIS — R079 Chest pain, unspecified: Secondary | ICD-10-CM | POA: Diagnosis not present

## 2014-03-24 DIAGNOSIS — I200 Unstable angina: Secondary | ICD-10-CM

## 2014-03-24 DIAGNOSIS — E785 Hyperlipidemia, unspecified: Secondary | ICD-10-CM | POA: Diagnosis not present

## 2014-03-24 LAB — CBC
HCT: 38.9 % — ABNORMAL LOW (ref 39.0–52.0)
Hemoglobin: 13.3 g/dL (ref 13.0–17.0)
MCH: 28.4 pg (ref 26.0–34.0)
MCHC: 34.2 g/dL (ref 30.0–36.0)
MCV: 82.9 fL (ref 78.0–100.0)
Platelets: 350 10*3/uL (ref 150–400)
RBC: 4.69 MIL/uL (ref 4.22–5.81)
RDW: 13.8 % (ref 11.5–15.5)
WBC: 7.5 10*3/uL (ref 4.0–10.5)

## 2014-03-24 LAB — COMPREHENSIVE METABOLIC PANEL
ALT: 69 U/L — ABNORMAL HIGH (ref 0–53)
AST: 47 U/L — ABNORMAL HIGH (ref 0–37)
Albumin: 3.7 g/dL (ref 3.5–5.2)
Alkaline Phosphatase: 88 U/L (ref 39–117)
BUN: 12 mg/dL (ref 6–23)
CO2: 28 mEq/L (ref 19–32)
Calcium: 9.4 mg/dL (ref 8.4–10.5)
Chloride: 104 mEq/L (ref 96–112)
Creat: 1.03 mg/dL (ref 0.50–1.35)
Glucose, Bld: 86 mg/dL (ref 70–99)
Potassium: 4.4 mEq/L (ref 3.5–5.3)
Sodium: 140 mEq/L (ref 135–145)
Total Bilirubin: 0.5 mg/dL (ref 0.2–1.2)
Total Protein: 7.1 g/dL (ref 6.0–8.3)

## 2014-03-24 LAB — GLUCOSE, POCT (MANUAL RESULT ENTRY): POC Glucose: 80 mg/dl (ref 70–99)

## 2014-03-24 LAB — LIPASE: Lipase: 57 U/L (ref 0–75)

## 2014-03-24 LAB — TROPONIN I: Troponin I: 0.02 ng/mL (ref <0.06)

## 2014-03-24 MED ORDER — PRAVASTATIN SODIUM 40 MG PO TABS: 40.0000 mg | ORAL_TABLET | Freq: Every day | ORAL | Status: DC

## 2014-03-24 NOTE — Patient Instructions (Signed)
Today's cardiogram does not show any acute change, but I am going to do a blood test to ensure all is ok.  I will also check labs for your pancreas and kidneys.    Be sure to see your heart doctor next week.  If you are not able to start eating in the next day or Nuno please come back in.    Stop taking the atorvastatin.  We will try pravastatin instead and see if you can take this more easily.

## 2014-03-24 NOTE — Progress Notes (Addendum)
[facility] [address] [place] [address] [phone]  Date:  03/24/2014   Name:  Barry [name]   DOB:  20-May-1951   MRN:  [id number]  PCP:  Lamar Blinks, MD    Chief Complaint: Hospitalization Follow-up   History of Present Illness:  Barry [name] is a 63 y.o. very pleasant male patient who presents with the following:  [place] gentleman here today for a hospital follow-up. I am listed as his PCP but per my knowledge saw him only once in 10/2011.  At that time I asked him to follow-up in a week but have not seen him again until today.    He was admitted from 6/23 to 6/25, discharge summary not yet complete.  He was hospitalized with abdominal pain- perhaps mild pancreatitis- and ARF.  ARF seems pre- renal in origin, creat back to 1.0 by time of discharge.  He was also admitted from 6/12 to 6/16 with unstable angina, and prior to that he was admitted from [age] yo 6/4 with NSEMI and was treated with PCI to the RCA.    There is a language barrier, but he is accompanied by family to help with translation. He states that he feels tired, and is "not eating at all."  He states that he feels "like he has a temperature."  He feels like his "chest is hot."  Details are somewhat unclear, but he states he has noted this chest discomfort for some time, not changing. This is worse with sitting, better with moving around.   His stomach still hurts.  His lipase was improved prior to discharge.  He has not been able to eat today but is drinking liquids.   He has not been taking atorvastatin because it "makes him sweat too much." He is not willing to take this anymore; would be ok to try a different cholesterol medication.    Most recent A1c 6/4 was 9.5.  It appears that he was on insulin in the hospital but is no longer taking this.  He is just on metformin 500 BID.   As far as I know he has never been on home insulin  Patient Active Problem List   Diagnosis Date Noted  . Aneurysm  of iliac artery 03/18/2014  . Abdominal pain, unspecified site 03/18/2014  . Unstable angina 03/11/2014  . Intermediate coronary syndrome   . Syncope in setting of nausea and vomiting 03/07/14 03/08/2014  . Nausea and vomiting 03/07/14 03/08/2014  . Chest pain with moderate risk of acute coronary syndrome 03/07/2014  . CAD- RCA BMS 02/27/14 02/28/2014  . Non compliance with medical treatment 02/28/2014  . Dyslipidemia 02/28/2014  . Bradycardia- beta blocker decreased 02/28/2014  . Acute on chronic diastolic CHF (congestive heart failure), NYHA class 3 - due to NSTEMI 02/28/2014  . Diastolic CHF 44/92/0100  . Type II or unspecified type diabetes mellitus with unspecified complication, uncontrolled 02/27/2014  . TIA (transient ischemic attack) 02/26/2014  . NSTEMI- 02/27/14 02/26/2014  . Pain in limb-Abdomen  12/06/2013  . Weakness-Left arm / Lef 12/06/2013  . Mesenteric artery stenosis 12/06/2013  . Dissection of mesenteric artery 12/03/2012  . Essential hypertension, benign 08/02/2012  . Hypertension 11/10/2011  . CVA (cerebral vascular accident) 11/10/2011  . Language barrier 11/10/2011    Past Medical History  Diagnosis Date  . Hypertension   . Diabetes mellitus   . Stroke   . Iliac artery aneurysm, left   . Dissection of mesenteric artery   .  CAD (coronary artery disease)   . Myocardial infarction     Past Surgical History  Procedure Laterality Date  . Admission  09/26/2009    CVA.  [name].  . Cardiac catheterization    . Coronary angioplasty      History  Substance Use Topics  . Smoking status: Former Smoker    Quit date: 06/09/2009  . Smokeless tobacco: Never Used  . Alcohol Use: No    Family History  Problem Relation Age of Onset  . Diabetes Father   . Hypertension Father   . Heart attack Father     No Known Allergies  Medication list has been reviewed and updated.  Current Outpatient Prescriptions on File Prior to Visit  Medication Sig Dispense  Refill  . aspirin EC 81 MG EC tablet Take 1 tablet (81 mg total) by mouth daily.      . clopidogrel (PLAVIX) 75 MG tablet Take 1 tablet (75 mg total) by mouth daily with breakfast.  30 tablet  11  . diphenhydrAMINE (BENADRYL) 25 MG tablet Take 50 mg by mouth daily as needed for allergies.      Marland Kitchen lisinopril (ZESTRIL) 20 MG tablet Take 1 tablet (20 mg total) by mouth daily.  30 tablet  11  . metFORMIN (GLUCOPHAGE) 500 MG tablet Take 1 tablet (500 mg total) by mouth 2 (two) times daily with a meal.  180 tablet  3  . metoprolol tartrate (LOPRESSOR) 25 MG tablet Take 12.5 mg by mouth 2 (two) times daily.      . nitroGLYCERIN (NITROSTAT) 0.4 MG SL tablet Place 1 tablet (0.4 mg total) under the tongue every 5 (five) minutes as needed for chest pain.  25 tablet  2  . OVER THE COUNTER MEDICATION Take 1 tablet by mouth daily. Natural E Complete from Nutrilite - contains Vitamin E 30 units and selenium 10 mcg      . pantoprazole (PROTONIX) 40 MG tablet Take 1 tablet (40 mg total) by mouth daily.  30 tablet  11  . atorvastatin (LIPITOR) 80 MG tablet Take 1 tablet (80 mg total) by mouth daily at 6 PM.  30 tablet  11   No current facility-administered medications on file prior to visit.    Review of Systems:  As per HPI- otherwise negative.   Physical Examination: Filed Vitals:   03/24/14 1247  BP: 123/72  Pulse: 62  Temp: 97.8 F (36.6 C)  Resp: 18   Filed Vitals:   03/24/14 1247  Height: 5' 5.5" (1.664 m)  Weight: 156 lb (70.761 kg)   Body mass index is 25.56 kg/(m^2). Ideal Body Weight: Weight in (lb) to have BMI = 25: 152.2  GEN: WDWN, NAD, Non-toxic, A & O x 3, well- appearing [place] gentleman HEENT: Atraumatic, Normocephalic. Neck supple. No masses, No LAD.  Bilateral TM wnl, oropharynx normal.  PEERL,EOMI.   Ears and Nose: No external deformity. CV: RRR, No M/G/R. No JVD. No thrill. No extra heart sounds. PULM: CTA B, no wheezes, crackles, rhonchi. No retractions. No resp.  distress. No accessory muscle use. ABD: S, NT, ND, +BS. No rebound. No HSM. EXTR: No c/c/e NEURO Normal gait.  PSYCH: Normally interactive. Conversant. Not depressed or anxious appearing.  Calm demeanor.   EKG:  No acute change noted per my read compared with last EKG from hospital.  Sinus bradycardia with non- specific T changes  Results for orders placed in visit on 03/24/14  GLUCOSE, POCT (MANUAL RESULT ENTRY)  Result Value Ref Range   POC Glucose 80  70 - 99 mg/dl    Assessment and Plan: Type II or unspecified type diabetes mellitus with unspecified complication, uncontrolled - Plan: POCT glucose (manual entry), EKG 12-Lead  Other acute pancreatitis - Plan: Lipase, EKG 12-Lead  Abdominal pain, epigastric - Plan: CBC, Comprehensive metabolic panel, EKG 63-OVFI  Other and unspecified hyperlipidemia - Plan: pravastatin (PRAVACHOL) 40 MG tablet, EKG 12-Lead  Chest pain, unspecified chest pain type - Plan: Troponin I, EKG 12-Lead  Here today to follow-up from recent hospitalization for abdominal pain.  Final dx is somewhat unclear, but it appears his abdominal pain was thought due to pancreatitis or mesenteric ischemia. He also had a NSEMI and PCI early this month.  Today will repeat labs as above to follow pancreatitis and ARF.    We will change from lipitor to pravachol; he is not willing to take lipitor due to SE Chest pain: it is difficult to get details due to language barrier.  Even working with his son (who speaks excellent English) it is hard to get a great history but his CP certainly seems atypical.  EKG is reassuring but will check a stat troponin I to look for any sign of acute myocardial injury.  Assuming this is ok he will follow-up with Dr. Aundra Dubin later on this week.  Continue asa and plavix.   Recent A1c suggests poor control of DM.  However it has been a very stressful month for him, and his glucose is currently ok.  Hesitate to use insulin in this case as he has a  history of non- compliance and teaching/ reading instructions will be difficult.  Will attempt to optimize with oral therapy first.   Signed Lamar Blinks, MD  Called with troponin I- negative, it is 0.02.  Let his son know and will await other labs.  See patient instructions for more details.    7/1: called and spoke with his son Pros.    Results for orders placed in visit on 03/24/14  CBC      Result Value Ref Range   WBC 7.5  4.0 - 10.5 K/uL   RBC 4.69  4.22 - 5.81 MIL/uL   Hemoglobin 13.3  13.0 - 17.0 g/dL   HCT 38.9 (*) 39.0 - 52.0 %   MCV 82.9  78.0 - 100.0 fL   MCH 28.4  26.0 - 34.0 pg   MCHC 34.2  30.0 - 36.0 g/dL   RDW 13.8  11.5 - 15.5 %   Platelets 350  150 - 400 K/uL  COMPREHENSIVE METABOLIC PANEL      Result Value Ref Range   Sodium 140  135 - 145 mEq/L   Potassium 4.4  3.5 - 5.3 mEq/L   Chloride 104  96 - 112 mEq/L   CO2 28  19 - 32 mEq/L   Glucose, Bld 86  70 - 99 mg/dL   BUN 12  6 - 23 mg/dL   Creat 1.03  0.50 - 1.35 mg/dL   Total Bilirubin 0.5  0.2 - 1.2 mg/dL   Alkaline Phosphatase 88  39 - 117 U/L   AST 47 (*) 0 - 37 U/L   ALT 69 (*) 0 - 53 U/L   Total Protein 7.1  6.0 - 8.3 g/dL   Albumin 3.7  3.5 - 5.2 g/dL   Calcium 9.4  8.4 - 10.5 mg/dL  LIPASE      Result Value Ref Range   Lipase 57  0 - 75 U/L  TROPONIN I      Result Value Ref Range   Troponin I 0.02  <0.06 ng/mL  GLUCOSE, POCT (MANUAL RESULT ENTRY)      Result Value Ref Range   POC Glucose 80  70 - 99 mg/dl   Labs are overall good, pancreas looks ok but LFTs are up a little bit.  Plan recheck here in 2 months assuming he is doing well, to see cardiology tomorrow.

## 2014-03-25 DIAGNOSIS — I69959 Hemiplegia and hemiparesis following unspecified cerebrovascular disease affecting unspecified side: Secondary | ICD-10-CM | POA: Diagnosis not present

## 2014-03-25 DIAGNOSIS — I7779 Dissection of other artery: Secondary | ICD-10-CM | POA: Diagnosis not present

## 2014-03-25 DIAGNOSIS — I214 Non-ST elevation (NSTEMI) myocardial infarction: Secondary | ICD-10-CM | POA: Diagnosis not present

## 2014-03-25 DIAGNOSIS — I714 Abdominal aortic aneurysm, without rupture, unspecified: Secondary | ICD-10-CM | POA: Diagnosis not present

## 2014-03-25 DIAGNOSIS — E119 Type 2 diabetes mellitus without complications: Secondary | ICD-10-CM | POA: Diagnosis not present

## 2014-03-25 DIAGNOSIS — I5032 Chronic diastolic (congestive) heart failure: Secondary | ICD-10-CM | POA: Diagnosis not present

## 2014-03-26 ENCOUNTER — Encounter: Payer: Self-pay | Admitting: Family Medicine

## 2014-03-27 ENCOUNTER — Encounter: Payer: Self-pay | Admitting: Cardiology

## 2014-03-27 ENCOUNTER — Ambulatory Visit (INDEPENDENT_AMBULATORY_CARE_PROVIDER_SITE_OTHER): Payer: Medicare Other | Admitting: Cardiology

## 2014-03-27 VITALS — BP 132/78 | HR 60 | Ht 65.5 in | Wt 157.0 lb

## 2014-03-27 DIAGNOSIS — I5032 Chronic diastolic (congestive) heart failure: Secondary | ICD-10-CM | POA: Diagnosis not present

## 2014-03-27 DIAGNOSIS — I714 Abdominal aortic aneurysm, without rupture, unspecified: Secondary | ICD-10-CM | POA: Diagnosis not present

## 2014-03-27 DIAGNOSIS — E785 Hyperlipidemia, unspecified: Secondary | ICD-10-CM | POA: Diagnosis not present

## 2014-03-27 DIAGNOSIS — I2584 Coronary atherosclerosis due to calcified coronary lesion: Secondary | ICD-10-CM

## 2014-03-27 DIAGNOSIS — I200 Unstable angina: Secondary | ICD-10-CM | POA: Diagnosis not present

## 2014-03-27 DIAGNOSIS — I10 Essential (primary) hypertension: Secondary | ICD-10-CM

## 2014-03-27 DIAGNOSIS — I7779 Dissection of other artery: Secondary | ICD-10-CM | POA: Diagnosis not present

## 2014-03-27 DIAGNOSIS — I214 Non-ST elevation (NSTEMI) myocardial infarction: Secondary | ICD-10-CM | POA: Diagnosis not present

## 2014-03-27 DIAGNOSIS — E119 Type 2 diabetes mellitus without complications: Secondary | ICD-10-CM | POA: Diagnosis not present

## 2014-03-27 DIAGNOSIS — I69959 Hemiplegia and hemiparesis following unspecified cerebrovascular disease affecting unspecified side: Secondary | ICD-10-CM | POA: Diagnosis not present

## 2014-03-27 DIAGNOSIS — I2510 Atherosclerotic heart disease of native coronary artery without angina pectoris: Secondary | ICD-10-CM

## 2014-03-27 NOTE — Discharge Summary (Signed)
I discussed the plan of care with my resident team. I have reviewed the discharge documentation, and I agree with it.

## 2014-03-27 NOTE — Patient Instructions (Signed)
Your physician recommends that you return for a FASTING lipid profile/BMET/liver profile.   You have been referred to Cardiac Rehab at [facility].  Your physician recommends that you schedule a follow-up appointment in: 2 months with Dr Aundra Dubin.

## 2014-03-28 DIAGNOSIS — I2510 Atherosclerotic heart disease of native coronary artery without angina pectoris: Secondary | ICD-10-CM | POA: Insufficient documentation

## 2014-03-28 NOTE — Progress Notes (Signed)
Patient ID: Barry [name], male   DOB: 1951-06-23, 63 y.o.   MRN: [id number] PCP: Dr. Lenna Sciara. Copland  63 yo with history of CAD and chronic superior mesenteric artery dissection presents for his first outpatient cardiology followup. Patient was admitted in 6/15 to [facility] with chest pain and NSTEMI.  He had LHC showing 95% proximal RCA stenosis and had DES to RCA.  He was subsequently readmitted later in 6/15 with unstable angina and had another cath, showing 90% LCx stenosis.  This was treated with DES to LCx.  Finally, he was readmitted a 2nd time 03/18/14 with abdominal pain.  Lipase was mildly elevated [place] pancreatitis was considered.  CTA abdomen showed no change to chronic SMA dissection.  Patient is now back home with wife and son.  Since discharge, he actually seems to be doing fairly well.  He speaks no [place] but an interpretor was present today, and his son was present who speaks [place].  He does not have any more abdominal pain.  He has followup with vascular surgery in about a month.  No chest pain.  No exertional dyspnea.  He walks in his neighborhood for about 1.5 miles/day with no problems.  He has quit smoking.  Main complaint is knee pain.  He is on pravastatin now, which he is tolerating.  He had weakness, nausea, diaphoresis with atorvastatin [place] stopped it. Son and wife think he is doing ok now.   Labs (6/15): K 4.4, creatinine 1.03, AST 47, ALT 69, HCT 38.9  PMH: 1. HTN 2. Type II diabetes 3. CVA in 2011 4. AAA: 2.2 cm in 3/15.  5. PAD: Left CIA with penetrating ulcer. 6. Chronic superior mesenteric artery dissection, followed by Dr Trula Slade. 7. CKD 8. CAD: NSTEMI 6/15 with 95% proximal RCA stenosis treated with DES.  Unstable angina later in 6/15, this time had DES to 90% mLCx.  Echo (6/15) with EF 50-55%, moderate focal basal septal hypertrophy, normal RV size and systolic function.  9. Carotid dopplers (6/15) with mild stenosis.  10. Hyperlipidemia: Did not tolerate atorvastatin.   SH:  Married, from [place] and does not speak English, quit smoking in 6/15.    FH: Father with MI.  ROS: All systems reviewed and negative except as per HPI. \  Current Outpatient Prescriptions  Medication Sig Dispense Refill  . aspirin EC 81 MG EC tablet Take 1 tablet (81 mg total) by mouth daily.      . clopidogrel (PLAVIX) 75 MG tablet Take 1 tablet (75 mg total) by mouth daily with breakfast.  30 tablet  11  . diphenhydrAMINE (BENADRYL) 25 MG tablet Take 50 mg by mouth daily as needed for allergies.      Marland Kitchen lisinopril (ZESTRIL) 20 MG tablet Take 1 tablet (20 mg total) by mouth daily.  30 tablet  11  . metFORMIN (GLUCOPHAGE) 500 MG tablet Take 1 tablet (500 mg total) by mouth 2 (two) times daily with a meal.  180 tablet  3  . metoprolol tartrate (LOPRESSOR) 25 MG tablet Take 12.5 mg by mouth 2 (two) times daily.      . nitroGLYCERIN (NITROSTAT) 0.4 MG SL tablet Place 1 tablet (0.4 mg total) under the tongue every 5 (five) minutes as needed for chest pain.  25 tablet  2  . OVER THE COUNTER MEDICATION Take 1 tablet by mouth daily. Natural E Complete from Nutrilite - contains Vitamin E 30 units and selenium 10 mcg      . pantoprazole (PROTONIX) 40 MG  tablet Take 1 tablet (40 mg total) by mouth daily.  30 tablet  11  . pravastatin (PRAVACHOL) 40 MG tablet Take 1 tablet (40 mg total) by mouth daily.  30 tablet  6   No current facility-administered medications for this visit.    BP 132/78  Pulse 60  Ht 5' 5.5" (1.664 m)  Wt 71.215 kg (157 lb)  BMI 25.72 kg/m2  SpO2 98% General: NAD Neck: No JVD, no thyromegaly or thyroid nodule.  Lungs: Clear to auscultation bilaterally with normal respiratory effort. CV: Nondisplaced PMI.  Heart regular S1/S2, no S3/S4, no murmur.  No peripheral edema.  No carotid bruit.  Normal pedal pulses.  Abdomen: Soft, nontender, no hepatosplenomegaly, no distention.  Skin: Intact without lesions or rashes.  Neurologic: Alert and oriented x 3.  Psych: Normal  affect. Extremities: No clubbing or cyanosis.  HEENT: Normal.   Assessment/Plan: 1. CAD: Stable s/p PCI to LCx and RCA.  EF low normal on echo.  He is on ASA 81, Plavix 75, metoprolol, ACEI, and pravastatin.  I would continue Plavix long-term as long as he does not develop breathing problems.  I would like him to do cardiac rehab but will need to work out logistics (he does not speak [place]).  2. PAD: Penetrating ulcer left CIA and chronic SMA dissection.  Patient currently denies abdominal pain.  Recent CTA abdomen did not show any changes in the appearance of the SMA.  Patient has followup with vascular surgery in about a month.  3. Smoking: I congratulated him on staying tobacco-free.  4. Hyperlipidemia: Unable to tolerate atorvastatin.  He is on pravastatin without symptoms.  His transaminases were mildly elevated when last checked.  I will check lipids and LFTs again in 1 month.  5. HTN: BP controlled on current regimen.  6. AAA: Will be followed by [facility].   Loralie Champagne 03/28/2014

## 2014-03-31 ENCOUNTER — Encounter: Payer: Medicare Other | Admitting: Physician Assistant

## 2014-04-03 DIAGNOSIS — I214 Non-ST elevation (NSTEMI) myocardial infarction: Secondary | ICD-10-CM | POA: Diagnosis not present

## 2014-04-03 DIAGNOSIS — I7779 Dissection of other artery: Secondary | ICD-10-CM | POA: Diagnosis not present

## 2014-04-03 DIAGNOSIS — I5032 Chronic diastolic (congestive) heart failure: Secondary | ICD-10-CM | POA: Diagnosis not present

## 2014-04-03 DIAGNOSIS — I714 Abdominal aortic aneurysm, without rupture, unspecified: Secondary | ICD-10-CM | POA: Diagnosis not present

## 2014-04-03 DIAGNOSIS — E119 Type 2 diabetes mellitus without complications: Secondary | ICD-10-CM | POA: Diagnosis not present

## 2014-04-03 DIAGNOSIS — I69959 Hemiplegia and hemiparesis following unspecified cerebrovascular disease affecting unspecified side: Secondary | ICD-10-CM | POA: Diagnosis not present

## 2014-04-07 ENCOUNTER — Other Ambulatory Visit: Payer: Self-pay | Admitting: Surgery

## 2014-04-07 ENCOUNTER — Other Ambulatory Visit (HOSPITAL_COMMUNITY): Payer: Medicare Other

## 2014-04-07 ENCOUNTER — Ambulatory Visit (HOSPITAL_COMMUNITY)
Admission: RE | Admit: 2014-04-07 | Discharge: 2014-04-07 | Disposition: A | Discharge status: Home / Self Care | Payer: Medicare Other | Source: Ambulatory Visit | Attending: Surgery | Admitting: Surgery

## 2014-04-07 ENCOUNTER — Telehealth: Payer: Self-pay

## 2014-04-07 DIAGNOSIS — I729 Aneurysm of unspecified site: Secondary | ICD-10-CM | POA: Insufficient documentation

## 2014-04-07 NOTE — Telephone Encounter (Signed)
His son Pros called- he reports that his father's feet are tingling (bilaterally) for about one week.  They are not numb, but feel like "pins and needles."  Suspect diabetic neuropathy, he will bring him in to be seen in the next couple of days

## 2014-04-07 NOTE — Telephone Encounter (Signed)
Patients son called and said he has medical questions regarding his father. He only wants a call from Dr Lorelei Pont.

## 2014-04-07 NOTE — Telephone Encounter (Signed)
Called back twice- no answer.  LMOM- please give me a call with more details.

## 2014-04-09 ENCOUNTER — Ambulatory Visit (INDEPENDENT_AMBULATORY_CARE_PROVIDER_SITE_OTHER): Payer: Medicare Other | Admitting: Family Medicine

## 2014-04-09 VITALS — BP 138/80 | HR 69 | Temp 97.8°F | Resp 14 | Ht 65.25 in | Wt 160.0 lb

## 2014-04-09 DIAGNOSIS — E119 Type 2 diabetes mellitus without complications: Secondary | ICD-10-CM | POA: Diagnosis not present

## 2014-04-09 DIAGNOSIS — I5032 Chronic diastolic (congestive) heart failure: Secondary | ICD-10-CM | POA: Diagnosis not present

## 2014-04-09 DIAGNOSIS — I714 Abdominal aortic aneurysm, without rupture, unspecified: Secondary | ICD-10-CM | POA: Diagnosis not present

## 2014-04-09 DIAGNOSIS — I214 Non-ST elevation (NSTEMI) myocardial infarction: Secondary | ICD-10-CM | POA: Diagnosis not present

## 2014-04-09 DIAGNOSIS — I200 Unstable angina: Secondary | ICD-10-CM

## 2014-04-09 DIAGNOSIS — G609 Hereditary and idiopathic neuropathy, unspecified: Secondary | ICD-10-CM | POA: Diagnosis not present

## 2014-04-09 DIAGNOSIS — I7779 Dissection of other artery: Secondary | ICD-10-CM | POA: Diagnosis not present

## 2014-04-09 DIAGNOSIS — I69959 Hemiplegia and hemiparesis following unspecified cerebrovascular disease affecting unspecified side: Secondary | ICD-10-CM | POA: Diagnosis not present

## 2014-04-09 DIAGNOSIS — G629 Polyneuropathy, unspecified: Secondary | ICD-10-CM

## 2014-04-09 MED ORDER — GABAPENTIN 300 MG PO CAPS: 300.0000 mg | ORAL_CAPSULE | Freq: Three times a day (TID) | ORAL | Status: DC

## 2014-04-09 NOTE — Patient Instructions (Signed)
Lets' try adding gabapentin for Barry [name]'s foot and leg pain.  This is a medication for nerve pain, which is often caused by diabetes.  Have him take one pill a day for 3 days, then 1 pill twice a day for 3 days, then can go to 1 pill three times a day if needed.    Please come and see me in about 2 months for a recheck.

## 2014-04-09 NOTE — Progress Notes (Signed)
[facility] [address], [place] [address] [phone]  Date:  04/09/2014   Name:  Barry [name]   DOB:  31-Dec-1950   MRN:  [id number]  PCP:  Barry Blinks, MD    Chief Complaint: Leg Pain   History of Present Illness:  Barry [name] is a 63 y.o. very pleasant male patient who presents with the following:  Here today for a recheck.  He notes that "both legs and feet are going to sleep."  This has been going on for about a week.  It will come and go, and is worse at night.   Sleeping well.  He is eating better.   He has had this sort of problem in the past- however it just seemed to resolve on its own.    He does have DM.   Walking does not make it worse.    Patient Active Problem List   Diagnosis Date Noted  . CAD (coronary artery disease), native coronary artery 03/28/2014  . Aneurysm of iliac artery 03/18/2014  . Abdominal pain, unspecified site 03/18/2014  . Unstable angina 03/11/2014  . Intermediate coronary syndrome   . Syncope in setting of nausea and vomiting 03/07/14 03/08/2014  . Nausea and vomiting 03/07/14 03/08/2014  . Chest pain with moderate risk of acute coronary syndrome 03/07/2014  . CAD- RCA BMS 02/27/14 02/28/2014  . Non compliance with medical treatment 02/28/2014  . Dyslipidemia 02/28/2014  . Bradycardia- beta blocker decreased 02/28/2014  . Acute on chronic diastolic CHF (congestive heart failure), NYHA class 3 - due to NSTEMI 02/28/2014  . Diastolic CHF 18/84/1660  . Type II or unspecified type diabetes mellitus with unspecified complication, uncontrolled 02/27/2014  . TIA (transient ischemic attack) 02/26/2014  . NSTEMI- 02/27/14 02/26/2014  . Pain in limb-Abdomen  12/06/2013  . Weakness-Left arm / Lef 12/06/2013  . Mesenteric artery stenosis 12/06/2013  . Dissection of mesenteric artery 12/03/2012  . Essential hypertension, benign 08/02/2012  . Hypertension 11/10/2011  . CVA (cerebral vascular accident) 11/10/2011  . Language  barrier 11/10/2011    Past Medical History  Diagnosis Date  . Hypertension   . Diabetes mellitus   . Stroke   . Iliac artery aneurysm, left   . Dissection of mesenteric artery   . CAD (coronary artery disease)   . Myocardial infarction     Past Surgical History  Procedure Laterality Date  . Admission  09/26/2009    CVA.  [name].  . Cardiac catheterization    . Coronary angioplasty      History  Substance Use Topics  . Smoking status: Former Smoker    Quit date: 06/09/2009  . Smokeless tobacco: Never Used  . Alcohol Use: No    Family History  Problem Relation Age of Onset  . Diabetes Father   . Hypertension Father   . Heart attack Father     No Known Allergies  Medication list has been reviewed and updated.  Current Outpatient Prescriptions on File Prior to Visit  Medication Sig Dispense Refill  . aspirin EC 81 MG EC tablet Take 1 tablet (81 mg total) by mouth daily.      . clopidogrel (PLAVIX) 75 MG tablet Take 1 tablet (75 mg total) by mouth daily with breakfast.  30 tablet  11  . diphenhydrAMINE (BENADRYL) 25 MG tablet Take 50 mg by mouth daily as needed for allergies.      Marland Kitchen lisinopril (ZESTRIL) 20 MG tablet Take 1 tablet (20 mg total)  by mouth daily.  30 tablet  11  . metFORMIN (GLUCOPHAGE) 500 MG tablet Take 1 tablet (500 mg total) by mouth 2 (two) times daily with a meal.  180 tablet  3  . metoprolol tartrate (LOPRESSOR) 25 MG tablet Take 12.5 mg by mouth 2 (two) times daily.      . nitroGLYCERIN (NITROSTAT) 0.4 MG SL tablet Place 1 tablet (0.4 mg total) under the tongue every 5 (five) minutes as needed for chest pain.  25 tablet  2  . OVER THE COUNTER MEDICATION Take 1 tablet by mouth daily. Natural E Complete from Nutrilite - contains Vitamin E 30 units and selenium 10 mcg      . pantoprazole (PROTONIX) 40 MG tablet Take 1 tablet (40 mg total) by mouth daily.  30 tablet  11  . pravastatin (PRAVACHOL) 40 MG tablet Take 1 tablet (40 mg total) by mouth  daily.  30 tablet  6   No current facility-administered medications on file prior to visit.    Review of Systems:  As per HPI- otherwise negative. They would also like to see about getting medicaid for him- would like a social work visit  Physical Examination: Filed Vitals:   04/09/14 1543  BP: 138/80  Pulse: 69  Temp: 97.8 F (36.6 C)  Resp: 14   Filed Vitals:   04/09/14 1543  Height: 5' 5.25" (1.657 m)  Weight: 160 lb (72.576 kg)   Body mass index is 26.43 kg/(m^2). Ideal Body Weight: Weight in (lb) to have BMI = 25: 151.1  GEN: WDWN, NAD, Non-toxic, A & O x 3, looks well, here with his son and grandson today HEENT: Atraumatic, Normocephalic. Neck supple. No masses, No LAD. Ears and Nose: No external deformity. CV: RRR, No M/G/R. No JVD. No thrill. No extra heart sounds. PULM: CTA B, no wheezes, crackles, rhonchi. No retractions. No resp. distress. No accessory muscle use. ABD: S, NT, ND, +BS. No rebound. No HSM. EXTR: No c/c/e NEURO Normal gait.  PSYCH: Normally interactive. Conversant. Not depressed or anxious appearing.  Calm demeanor.  He has decreased sensation to monofilament both feet, however feet are warm and well perfused, good pulses.  He has slightly decreased strength on the left from his stoke in 2012.    Assessment and Plan: Peripheral neuropathy - Plan: gabapentin (NEURONTIN) 300 MG capsule, Ambulatory referral to Social Work  Will try neurontin for his peripheral neuropathy.  Recheck in 2-3 months.  Titrate up dose as needed  Audiometry testing today shows hearing loss.  They will look into an audiologist for possible hearing aids.  See patient instructions for more details.     Signed Barry Blinks, MD

## 2014-04-17 DIAGNOSIS — I214 Non-ST elevation (NSTEMI) myocardial infarction: Secondary | ICD-10-CM | POA: Diagnosis not present

## 2014-04-17 DIAGNOSIS — I5032 Chronic diastolic (congestive) heart failure: Secondary | ICD-10-CM | POA: Diagnosis not present

## 2014-04-17 DIAGNOSIS — I7779 Dissection of other artery: Secondary | ICD-10-CM | POA: Diagnosis not present

## 2014-04-17 DIAGNOSIS — I69959 Hemiplegia and hemiparesis following unspecified cerebrovascular disease affecting unspecified side: Secondary | ICD-10-CM | POA: Diagnosis not present

## 2014-04-17 DIAGNOSIS — I714 Abdominal aortic aneurysm, without rupture, unspecified: Secondary | ICD-10-CM | POA: Diagnosis not present

## 2014-04-17 DIAGNOSIS — E119 Type 2 diabetes mellitus without complications: Secondary | ICD-10-CM | POA: Diagnosis not present

## 2014-04-23 ENCOUNTER — Other Ambulatory Visit: Payer: Self-pay

## 2014-04-23 MED ORDER — GLUCOSE BLOOD VI STRP: ORAL_STRIP | Status: DC

## 2014-04-24 ENCOUNTER — Telehealth: Payer: Self-pay

## 2014-04-24 ENCOUNTER — Telehealth (HOSPITAL_COMMUNITY): Payer: Self-pay | Admitting: *Deleted

## 2014-04-24 DIAGNOSIS — E139 Other specified diabetes mellitus without complications: Secondary | ICD-10-CM

## 2014-04-24 MED ORDER — GLUCOSE BLOOD VI STRP: ORAL_STRIP | Status: DC

## 2014-04-24 NOTE — Telephone Encounter (Signed)
Pt was prescribed one touch ultra, but pharmacist told him he needs true test electro, does not know if these test strips will work with the machine, please call his grandson abel at [phone]

## 2014-04-24 NOTE — Telephone Encounter (Signed)
Spoke to grandson, sending correct test strips in.

## 2014-04-24 NOTE — Telephone Encounter (Signed)
Used pacific interpretation.  Asked pt would he like to participate in Cardiac rehab - exercise program three days a week.  Pt declined through use of interpretor to participate with cardiac rehab at this time.  Pt is already exercising on his own.

## 2014-04-24 NOTE — Telephone Encounter (Addendum)
Spoke to grandson again and resending Rx w/Dx code and exact instr's.

## 2014-04-24 NOTE — Addendum Note (Signed)
Addended by: Elwyn Reach A on: 04/24/2014 05:15 PM   Modules accepted: Orders

## 2014-04-25 ENCOUNTER — Other Ambulatory Visit: Payer: Self-pay | Admitting: *Deleted

## 2014-04-25 ENCOUNTER — Encounter: Payer: Self-pay | Admitting: Surgery

## 2014-04-25 MED ORDER — ONETOUCH ULTRA SYSTEM W/DEVICE KIT: 1.0000 | PACK | Freq: Once | Status: DC

## 2014-04-28 ENCOUNTER — Encounter: Payer: Self-pay | Admitting: Surgery

## 2014-04-28 ENCOUNTER — Encounter: Payer: Self-pay | Admitting: Gastroenterology

## 2014-04-28 ENCOUNTER — Ambulatory Visit (INDEPENDENT_AMBULATORY_CARE_PROVIDER_SITE_OTHER): Payer: Medicare Other | Admitting: Surgery

## 2014-04-28 ENCOUNTER — Other Ambulatory Visit (INDEPENDENT_AMBULATORY_CARE_PROVIDER_SITE_OTHER): Payer: Medicare Other

## 2014-04-28 VITALS — BP 131/84 | HR 60 | Temp 98.1°F | Resp 14 | Ht 68.9 in | Wt 162.0 lb

## 2014-04-28 DIAGNOSIS — K55059 Acute (reversible) ischemia of intestine, part and extent unspecified: Secondary | ICD-10-CM | POA: Diagnosis not present

## 2014-04-28 DIAGNOSIS — I714 Abdominal aortic aneurysm, without rupture, unspecified: Secondary | ICD-10-CM | POA: Diagnosis not present

## 2014-04-28 DIAGNOSIS — I2584 Coronary atherosclerosis due to calcified coronary lesion: Secondary | ICD-10-CM | POA: Diagnosis not present

## 2014-04-28 DIAGNOSIS — R1013 Epigastric pain: Secondary | ICD-10-CM | POA: Diagnosis not present

## 2014-04-28 DIAGNOSIS — K551 Chronic vascular disorders of intestine: Secondary | ICD-10-CM

## 2014-04-28 DIAGNOSIS — E785 Hyperlipidemia, unspecified: Secondary | ICD-10-CM | POA: Diagnosis not present

## 2014-04-28 LAB — LIPID PANEL
Cholesterol: 143 mg/dL (ref 0–200)
HDL: 24.9 mg/dL — ABNORMAL LOW (ref >39.00)
NonHDL: 118.1
Total CHOL/HDL Ratio: 6
Triglycerides: 289 mg/dL — ABNORMAL HIGH (ref 0.0–149.0)
VLDL: 57.8 mg/dL — ABNORMAL HIGH (ref 0.0–40.0)

## 2014-04-28 LAB — HEPATIC FUNCTION PANEL
ALT: 30 U/L (ref 0–53)
AST: 29 U/L (ref 0–37)
Albumin: 3.6 g/dL (ref 3.5–5.2)
Alkaline Phosphatase: 74 U/L (ref 39–117)
Bilirubin, Direct: 0 mg/dL (ref 0.0–0.3)
Total Bilirubin: 0.5 mg/dL (ref 0.2–1.2)
Total Protein: 7.2 g/dL (ref 6.0–8.3)

## 2014-04-28 LAB — BASIC METABOLIC PANEL
BUN: 13 mg/dL (ref 6–23)
CO2: 30 mEq/L (ref 19–32)
Calcium: 8.8 mg/dL (ref 8.4–10.5)
Chloride: 100 mEq/L (ref 96–112)
Creatinine, Ser: 1.1 mg/dL (ref 0.4–1.5)
GFR: 70.83 mL/min (ref >60.00)
Glucose, Bld: 118 mg/dL — ABNORMAL HIGH (ref 70–99)
Potassium: 3.8 mEq/L (ref 3.5–5.1)
Sodium: 135 mEq/L (ref 135–145)

## 2014-04-28 LAB — LDL CHOLESTEROL, DIRECT: Direct LDL: 87.7 mg/dL

## 2014-04-28 NOTE — Addendum Note (Signed)
Addended by: Mena Goes on: 04/28/2014 04:35 PM   Modules accepted: Orders

## 2014-04-28 NOTE — Progress Notes (Signed)
Patient name: Barry [name] MRN: [id number] DOB: Sep 12, 1951 Sex: male     Chief Complaint  Patient presents with  . Mesenteric Insufficiency    Follow up for mesenteric dissection on CT     HISTORY OF PRESENT ILLNESS: The patient is here today for followup of his abdominal pain which required hospitalization at the end of June 2015.  The patient is well known to me, having had an episode of abdominal pain approximately one year ago.  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  During his hospitalization, he had coronary stents placed for a NSTEMI.  There is a significant language barrier for the patient.  The patient continues to complain of left-sided abdominal pain.  This causes postprandial pain and limits his intake.  He has not lost any weight, he reports actually gaining weight.  He states that he had similar pain back in 1984 before moving to the [place].  It has gotten significantly worse.  This is associated with nausea but no diarrhea.  Past Medical History  Diagnosis Date  . Hypertension   . Diabetes mellitus   . Stroke   . Iliac artery aneurysm, left   . Dissection of mesenteric artery   . CAD (coronary artery disease)   . Myocardial infarction     Past Surgical History  Procedure Laterality Date  . Admission  09/26/2009    CVA.  [name].  . Cardiac catheterization    . Coronary angioplasty      History   Social History  . Marital Status: Single    Spouse Name: N/A    Number of Children: N/A  . Years of Education: N/A   Occupational History  . Not on file.   Social History Main Topics  . Smoking status: Former Smoker    Quit date: 06/09/2009  . Smokeless tobacco: Never Used  . Alcohol Use: No  . Drug Use: No  . Sexual Activity: Not Currently   Other Topics Concern  . Not on file   Social History Narrative   Marital: married.      Lives: with son,wife.    Children: 3 children; 6 grandchildren      Employed: unemployed; disability unknown reason/CVA L sided weakness      Tobacco:  Quit 2012; smoked 40 years.       Alcohol: no drinking now; social in past.       Drugs: none       Exercise: sporadic.       ADLs:  No driving since CVA.    Family History  Problem Relation Age of Onset  . Diabetes Father   . Hypertension Father   . Heart attack Father     Allergies as of 04/28/2014  . (No Known Allergies)    Current Outpatient Prescriptions on File Prior to Visit  Medication Sig Dispense Refill  . aspirin EC 81 MG EC tablet Take 1 tablet (81 mg total) by mouth daily.      . Blood Glucose Monitoring Suppl (ONE TOUCH ULTRA SYSTEM KIT) W/DEVICE KIT 1 kit by Does not apply route once. One touch delica lancets, and meter Pt will test once daily. Dx 250.92  1 each  0  . clopidogrel (PLAVIX) 75 MG tablet Take 1 tablet (75 mg total) by mouth daily with breakfast.  30 tablet  11  . diphenhydrAMINE (BENADRYL) 25 MG tablet Take 50 mg  by mouth daily as needed for allergies.      Marland Kitchen gabapentin (NEURONTIN) 300 MG capsule Take 1 capsule (300 mg total) by mouth 3 (three) times daily.  90 capsule  3  . glucose blood test strip Test blood sugar daily. Dx code: 250.92.  100 each  3  . lisinopril (ZESTRIL) 20 MG tablet Take 1 tablet (20 mg total) by mouth daily.  30 tablet  11  . metFORMIN (GLUCOPHAGE) 500 MG tablet Take 1 tablet (500 mg total) by mouth 2 (two) times daily with a meal.  180 tablet  3  . metoprolol tartrate (LOPRESSOR) 25 MG tablet Take 12.5 mg by mouth 2 (two) times daily.      . nitroGLYCERIN (NITROSTAT) 0.4 MG SL tablet Place 1 tablet (0.4 mg total) under the tongue every 5 (five) minutes as needed for chest pain.  25 tablet  2  . OVER THE COUNTER MEDICATION Take 1 tablet by mouth daily. Natural E Complete from Nutrilite - contains Vitamin E 30 units and selenium 10 mcg      . pantoprazole (PROTONIX) 40 MG tablet Take 1 tablet (40 mg total)  by mouth daily.  30 tablet  11  . pravastatin (PRAVACHOL) 40 MG tablet Take 1 tablet (40 mg total) by mouth daily.  30 tablet  6   No current facility-administered medications on file prior to visit.     REVIEW OF SYSTEMS: Please see history of present illness, otherwise all systems negative  PHYSICAL EXAMINATION:   Vital signs are BP 131/84  Pulse 60  Temp(Src) 98.1 F (36.7 C) (Oral)  Resp 14  Ht 5' 8.9" (1.75 m)  Wt 162 lb (73.483 kg)  BMI 23.99 kg/m2  SpO2 98% General: The patient appears their stated age. HEENT:  No gross abnormalities Pulmonary:  Non labored breathing Abdomen: Soft.  Left lower quadrant pain to palpation. Musculoskeletal: There are no major deformities. Neurologic: No focal weakness or paresthesias are detected, Skin: There are no ulcer or rashes noted. Psychiatric: The patient has normal affect. Cardiovascular: There is a regular rate and rhythm without significant murmur.  No abdominal bruits. appreciated.   Diagnostic Studies Most recent ultrasound wasn't 04/07/2014.  Aneurysmal dilatation of the superior mesenteric artery was 1.29 cm.  The artery is widely patent.  Assessment: Abdominal pain Chronic mesenteric dissection Plan: I discussed with the patient via the interpreter that I do not feel his abdominal pain is related to his mesenteric dissection.  Most likely there is something else going on.  When I looked back to the records, I cannot find where he has had a formal GI consult.  I think this is necessary given his history.  I am referring him today. I would not recommend any intervention on the superior mesenteric artery at this time.  I have scheduled him for followup in one year with an ultrasound to await the size of any associated aneurysmal degeneration.  Eldridge Abrahams, M.D. [facility] Office: [phone] Pager:  [phone]

## 2014-05-01 DIAGNOSIS — I5032 Chronic diastolic (congestive) heart failure: Secondary | ICD-10-CM | POA: Diagnosis not present

## 2014-05-01 DIAGNOSIS — I714 Abdominal aortic aneurysm, without rupture, unspecified: Secondary | ICD-10-CM | POA: Diagnosis not present

## 2014-05-01 DIAGNOSIS — I7779 Dissection of other artery: Secondary | ICD-10-CM | POA: Diagnosis not present

## 2014-05-01 DIAGNOSIS — E119 Type 2 diabetes mellitus without complications: Secondary | ICD-10-CM | POA: Diagnosis not present

## 2014-05-01 DIAGNOSIS — I69959 Hemiplegia and hemiparesis following unspecified cerebrovascular disease affecting unspecified side: Secondary | ICD-10-CM | POA: Diagnosis not present

## 2014-05-01 DIAGNOSIS — I214 Non-ST elevation (NSTEMI) myocardial infarction: Secondary | ICD-10-CM | POA: Diagnosis not present

## 2014-05-05 ENCOUNTER — Other Ambulatory Visit: Payer: Self-pay | Admitting: *Deleted

## 2014-05-05 DIAGNOSIS — I5032 Chronic diastolic (congestive) heart failure: Secondary | ICD-10-CM | POA: Diagnosis not present

## 2014-05-05 DIAGNOSIS — E785 Hyperlipidemia, unspecified: Secondary | ICD-10-CM

## 2014-05-05 DIAGNOSIS — I714 Abdominal aortic aneurysm, without rupture, unspecified: Secondary | ICD-10-CM | POA: Diagnosis not present

## 2014-05-05 DIAGNOSIS — E119 Type 2 diabetes mellitus without complications: Secondary | ICD-10-CM | POA: Diagnosis not present

## 2014-05-05 DIAGNOSIS — I214 Non-ST elevation (NSTEMI) myocardial infarction: Secondary | ICD-10-CM | POA: Diagnosis not present

## 2014-05-05 DIAGNOSIS — I69959 Hemiplegia and hemiparesis following unspecified cerebrovascular disease affecting unspecified side: Secondary | ICD-10-CM | POA: Diagnosis not present

## 2014-05-05 DIAGNOSIS — I7779 Dissection of other artery: Secondary | ICD-10-CM | POA: Diagnosis not present

## 2014-05-05 MED ORDER — FISH OIL ULTRA 1000 MG PO CAPS: 2.0000 | ORAL_CAPSULE | Freq: Every day | ORAL | Status: DC

## 2014-05-05 MED ORDER — PRAVASTATIN SODIUM 80 MG PO TABS: 80.0000 mg | ORAL_TABLET | Freq: Every day | ORAL | Status: DC

## 2014-05-07 ENCOUNTER — Telehealth: Payer: Self-pay | Admitting: Cardiology

## 2014-05-07 ENCOUNTER — Encounter: Payer: Self-pay | Admitting: Gastroenterology

## 2014-05-07 ENCOUNTER — Telehealth: Payer: Self-pay | Admitting: Family Medicine

## 2014-05-07 ENCOUNTER — Telehealth: Payer: Self-pay | Admitting: *Deleted

## 2014-05-07 ENCOUNTER — Ambulatory Visit (INDEPENDENT_AMBULATORY_CARE_PROVIDER_SITE_OTHER): Payer: Medicare Other | Admitting: Gastroenterology

## 2014-05-07 VITALS — BP 116/74 | HR 80 | Ht 65.0 in | Wt 163.0 lb

## 2014-05-07 DIAGNOSIS — I200 Unstable angina: Secondary | ICD-10-CM

## 2014-05-07 DIAGNOSIS — Z1211 Encounter for screening for malignant neoplasm of colon: Secondary | ICD-10-CM | POA: Diagnosis not present

## 2014-05-07 DIAGNOSIS — R109 Unspecified abdominal pain: Secondary | ICD-10-CM

## 2014-05-07 MED ORDER — MOVIPREP 100 G PO SOLR: 1.0000 | Freq: Once | ORAL | Status: DC

## 2014-05-07 NOTE — Telephone Encounter (Signed)
New message    Patient having upcoming colonscopy on  10/12 . Please address plavix.

## 2014-05-07 NOTE — Telephone Encounter (Signed)
Received notice from his home health nurse that his BP has been running up to around 160-170/80- 95.  However I also see where he had 2 recent visits with MD where his BP looked great.  Today at GI his BP was 116/74.  Called and discussed with his son.  They will follow-up with me as needed

## 2014-05-07 NOTE — Telephone Encounter (Signed)
Barry [name] is aware pt had 2 drug eluding stents just placed in June 2015 & cannot come off Plavix.  The colonoscopy was a general screening b/c pt had not had one in a long time per [facility].  Horton Chin RN

## 2014-05-07 NOTE — Patient Instructions (Signed)
You have been scheduled for a colonoscopy with Dr. Ardis Hughs. Please follow written instructions given to you at your visit today.  Please pick up your prep kit at the pharmacy within the next 1-3 days. If you use inhalers (even only as needed), please bring them with you on the day of your procedure. Your physician has requested that you go to www.startemmi.com and enter the access code given to you at your visit today. This web site gives a general overview about your procedure. However, you should still follow specific instructions given to you by our office regarding your preparation for the procedure.  You will be contacted by our office prior to your procedure for directions on holding your Plavix.  If you do not hear from our office 1 week prior to your scheduled procedure, please call [phone] to discuss.

## 2014-05-07 NOTE — Progress Notes (Signed)
i agree with the above note, plan 

## 2014-05-07 NOTE — Progress Notes (Signed)
05/07/2014 Barry [name] [id number] [birth year]   HISTORY OF PRESENT ILLNESS:  This is a 63 year old male from [place] who speaks very little English Barry [name] is here today with his wife, his son, and a Optometrist.  He has a history of CVA and is on plavix.  In June he also had an NSTEMI and had cath with DES x 2 placed.  He is here for complaints of abdominal pain.  The pain is intermittent and located mostly in his lower abdomen.  It has been occurring for more than 10 years.  He denies any pain currently.  Pain may be post-prandial but he is unsure how long after eating that the pain tends to occur.  His last pain occurred last week.  Denies diarrhea or blood in his stool.  Sometimes has constipation, but usually as a BD daily.  Never underwent colonoscopy in the past.  Appetite is good and weight is stable overall according to his report.    CT scans including CTA of the abdomen and pelvis have been performed.  He has a chronic superior mesenteric artery dissection for which he has seen vascular surgery.  There is no plan for repair but they do believe that he is at risk for chronic mesenteric ischemia.  CBC, CMP, and lipase all unremarkable recently.   Past Medical History  Diagnosis Date  . Hypertension   . Diabetes mellitus   . Stroke   . Iliac artery aneurysm, left   . Dissection of mesenteric artery   . CAD (coronary artery disease)   . Myocardial infarction    Past Surgical History  Procedure Laterality Date  . Admission  09/26/2009    CVA.  Barry [name].  . Cardiac catheterization    . Coronary angioplasty      reports that he quit smoking about 4 years ago. He has never used smokeless tobacco. He reports that he does not drink alcohol or use illicit drugs. family history includes Diabetes in his father; Heart attack in his father; Hypertension in his father. No Known Allergies    Outpatient Encounter Prescriptions as of 05/07/2014  Medication Sig  . aspirin EC 81 MG EC tablet  Take 1 tablet (81 mg total) by mouth daily.  . Blood Glucose Monitoring Suppl (ONE TOUCH ULTRA SYSTEM KIT) W/DEVICE KIT 1 kit by Does not apply route once. One touch delica lancets, and meter Pt will test once daily. Dx 250.92  . clopidogrel (PLAVIX) 75 MG tablet Take 1 tablet (75 mg total) by mouth daily with breakfast.  . diphenhydrAMINE (BENADRYL) 25 MG tablet Take 50 mg by mouth daily as needed for allergies.  Marland Kitchen gabapentin (NEURONTIN) 300 MG capsule Take 1 capsule (300 mg total) by mouth 3 (three) times daily.  Marland Kitchen glucose blood test strip Test blood sugar daily. Dx code: 61.92.  Marland Kitchen lisinopril (ZESTRIL) 20 MG tablet Take 1 tablet (20 mg total) by mouth daily.  . metFORMIN (GLUCOPHAGE) 500 MG tablet Take 1 tablet (500 mg total) by mouth 2 (two) times daily with a meal.  . metoprolol tartrate (LOPRESSOR) 25 MG tablet Take 12.5 mg by mouth 2 (two) times daily.  . nitroGLYCERIN (NITROSTAT) 0.4 MG SL tablet Place 1 tablet (0.4 mg total) under the tongue every 5 (five) minutes as needed for chest pain.  . Omega-3 Fatty Acids (FISH OIL ULTRA) 1000 MG CAPS Take 2 capsules (2,000 mg total) by mouth daily.  Marland Kitchen OVER THE COUNTER MEDICATION Take 1 tablet by mouth  daily. Natural E Complete from Nutrilite - contains Vitamin E 30 units and selenium 10 mcg  . pantoprazole (PROTONIX) 40 MG tablet Take 1 tablet (40 mg total) by mouth daily.  . pravastatin (PRAVACHOL) 80 MG tablet Take 1 tablet (80 mg total) by mouth daily.  Marland Kitchen MOVIPREP 100 G SOLR Take 1 kit (200 g total) by mouth once.     REVIEW OF SYSTEMS  : All other systems reviewed and negative except where noted in the History of Present Illness.   PHYSICAL EXAM: BP 116/74  Pulse 80  Ht 5' 5"  (1.651 m)  Wt 163 lb (73.936 kg)  BMI 27.12 kg/m2 General: Well developed male in no acute distress Head: Normocephalic and atraumatic Eyes:  Sclerae anicteric, conjunctiva pink. Ears: Normal auditory acuity  Lungs: Clear throughout to auscultation Heart:  Regular rate and rhythm Abdomen: Soft, non-distended.  Normal bowel sounds.  Non-tender. Musculoskeletal: Symmetrical with no gross deformities  Skin: No lesions on visible extremities Extremities: No edema  Neurological: Alert oriented x 4, grossly non-focal Psychological:  Alert and cooperative. Normal mood and affect  ASSESSMENT AND PLAN: -Abdominal pain:  Chronic, > 10 years.  Has chronic superior mesenteric artery dissection and is at risk for chronic mesenteric ischemia per vascular surgery, which could be accounting for his symptoms.  Weight is stable.  Never had colonoscopy.  He will need colonoscopy at least for screening purposes, however, he is on Plavix and just had an NSTEMI with cath and DES placement x 2 in June.  We will contact cardiology and see when they would allow him to come off of the plavix in order to perform procedure.

## 2014-05-07 NOTE — Telephone Encounter (Signed)
  05/07/2014   RE: Barry [name] DOB: 08-24-1951 MRN: [id number]   Dear Dr. Kirk Ruths,    We have scheduled the above patient for an Colonoscopy. Our records show that he is on anticoagulation therapy.   Please advise as to how long the patient may come off his therapy of Plavix  prior to the procedure, which is scheduled for 07-07-2014.  Please fax back/ or route the completed form to Evette Georges., CMA  Sincerely,    Hope Pigeon

## 2014-05-07 NOTE — Telephone Encounter (Signed)
Spoke with Dr. Claris Gladden nurse and she advised patient is on a drug eluting stent and cannot come off his Plavix. Procedure could possibly be done next year.  I called patient and spoke with wife and son to advise procedure will be cancelled per Dr. Aundra Dubin because patient cannot stop Plavix. Wife and son verbalized understanding.

## 2014-05-15 DIAGNOSIS — E119 Type 2 diabetes mellitus without complications: Secondary | ICD-10-CM | POA: Diagnosis not present

## 2014-05-15 DIAGNOSIS — I69959 Hemiplegia and hemiparesis following unspecified cerebrovascular disease affecting unspecified side: Secondary | ICD-10-CM | POA: Diagnosis not present

## 2014-05-15 DIAGNOSIS — I714 Abdominal aortic aneurysm, without rupture, unspecified: Secondary | ICD-10-CM | POA: Diagnosis not present

## 2014-05-15 DIAGNOSIS — I214 Non-ST elevation (NSTEMI) myocardial infarction: Secondary | ICD-10-CM | POA: Diagnosis not present

## 2014-05-15 DIAGNOSIS — I5032 Chronic diastolic (congestive) heart failure: Secondary | ICD-10-CM | POA: Diagnosis not present

## 2014-05-15 DIAGNOSIS — I7779 Dissection of other artery: Secondary | ICD-10-CM | POA: Diagnosis not present

## 2014-05-28 ENCOUNTER — Ambulatory Visit: Payer: Medicare Other | Admitting: Cardiology

## 2014-05-29 ENCOUNTER — Other Ambulatory Visit: Payer: Self-pay | Admitting: *Deleted

## 2014-05-30 ENCOUNTER — Ambulatory Visit (INDEPENDENT_AMBULATORY_CARE_PROVIDER_SITE_OTHER): Payer: Medicare Other | Admitting: Cardiology

## 2014-05-30 ENCOUNTER — Encounter: Payer: Self-pay | Admitting: Cardiology

## 2014-05-30 VITALS — BP 154/82 | HR 59 | Ht 65.0 in | Wt 166.4 lb

## 2014-05-30 DIAGNOSIS — I10 Essential (primary) hypertension: Secondary | ICD-10-CM

## 2014-05-30 DIAGNOSIS — I25119 Atherosclerotic heart disease of native coronary artery with unspecified angina pectoris: Secondary | ICD-10-CM

## 2014-05-30 DIAGNOSIS — K551 Chronic vascular disorders of intestine: Secondary | ICD-10-CM | POA: Diagnosis not present

## 2014-05-30 DIAGNOSIS — I2510 Atherosclerotic heart disease of native coronary artery without angina pectoris: Secondary | ICD-10-CM

## 2014-05-30 DIAGNOSIS — I200 Unstable angina: Secondary | ICD-10-CM

## 2014-05-30 DIAGNOSIS — E785 Hyperlipidemia, unspecified: Secondary | ICD-10-CM

## 2014-05-30 DIAGNOSIS — I209 Angina pectoris, unspecified: Secondary | ICD-10-CM | POA: Diagnosis not present

## 2014-05-30 MED ORDER — LISINOPRIL 40 MG PO TABS: 40.0000 mg | ORAL_TABLET | Freq: Every day | ORAL | Status: DC

## 2014-05-30 MED ORDER — ISOSORBIDE MONONITRATE ER 30 MG PO TB24: 30.0000 mg | ORAL_TABLET | Freq: Every day | ORAL | Status: DC

## 2014-05-30 NOTE — Patient Instructions (Signed)
Your physician has recommended you make the following change in your medication:   1. Start Imdur 30 mg 1 tablet daily.   2. Increase lisinopril to 40 mg daily.   Your physician recommends that you schedule a follow-up appointment in: 4 months with the PA/NP, while Dr. Aundra Dubin is in the office.

## 2014-06-01 NOTE — Progress Notes (Signed)
Patient ID: Barry [name], male   DOB: [birth year], 63 y.o.   MRN: [id number] PCP: Dr. Lenna Sciara. Copland  63 yo with history of CAD and chronic superior mesenteric artery dissection presents for his first outpatient cardiology followup. Patient was admitted in 6/15 to [facility] with chest pain and NSTEMI.  He had LHC showing 95% proximal RCA stenosis and had DES to RCA.  He was subsequently readmitted later in 6/15 with unstable angina and had another cath, showing 90% LCx stenosis.  This was treated with DES to LCx.  Finally, he was readmitted a 2nd time 03/18/14 with abdominal pain.  Lipase was mildly elevated Kinsella pancreatitis was considered.  CTA abdomen showed no change to chronic SMA dissection.  Patient is now back home with wife and son.  Since discharge, he actually seems to be doing fairly well.  He speaks no [place] but an interpretor was present today, and his son was present who speaks [place]. Rare abdominal pain now.  No exertional dyspnea.  He walks outside daily, normally with no problems.  He has quit smoking.  2 days ago, he did have an episode of chest pain when walking that resolved with NTG.  This is the only time he has had chest pain recently. SBP running 150s-160s.   Labs (6/15): K 4.4, creatinine 1.03, AST 47, ALT 69, HCT 38.9 Labs (8/15): K 3.8, creatinine 1.1, LDL 88, LFTs normal  ECG: NSR, old inferior MI  PMH: 1. HTN 2. Type II diabetes 3. CVA in 2011 4. AAA: 2.2 cm in 3/15.  5. PAD: Left CIA with penetrating ulcer. 6. Chronic superior mesenteric artery dissection, followed by Dr Trula Slade. 7. CKD 8. CAD: NSTEMI 6/15 with 95% proximal RCA stenosis treated with DES.  Unstable angina later in 6/15, this time had DES to 90% mLCx.  Echo (6/15) with EF 50-55%, moderate focal basal septal hypertrophy, normal RV size and systolic function.  9. Carotid dopplers (6/15) with mild stenosis.  10. Hyperlipidemia: Did not tolerate atorvastatin.   SH: Married, from [place] and does not speak English,  quit smoking in 6/15.    FH: Father with MI.  ROS: All systems reviewed and negative except as per HPI. \  Current Outpatient Prescriptions  Medication Sig Dispense Refill  . aspirin EC 81 MG EC tablet Take 1 tablet (81 mg total) by mouth daily.      . Blood Glucose Monitoring Suppl (ONE TOUCH ULTRA SYSTEM KIT) W/DEVICE KIT 1 kit by Does not apply route once. One touch delica lancets, and meter Pt will test once daily. Dx 250.92  1 each  0  . clopidogrel (PLAVIX) 75 MG tablet Take 1 tablet (75 mg total) by mouth daily with breakfast.  30 tablet  11  . diphenhydrAMINE (BENADRYL) 25 MG tablet Take 50 mg by mouth daily as needed for allergies.      Marland Kitchen gabapentin (NEURONTIN) 300 MG capsule Take 1 capsule (300 mg total) by mouth 3 (three) times daily.  90 capsule  3  . glucose blood test strip Test blood sugar daily. Dx code: 250.92.  100 each  3  . lisinopril (PRINIVIL,ZESTRIL) 40 MG tablet Take 1 tablet (40 mg total) by mouth daily.  30 tablet  6  . metFORMIN (GLUCOPHAGE) 500 MG tablet Take 1 tablet (500 mg total) by mouth 2 (two) times daily with a meal.  180 tablet  3  . metoprolol tartrate (LOPRESSOR) 25 MG tablet Take 12.5 mg by mouth 2 (two) times daily.      Marland Kitchen  MOVIPREP 100 G SOLR Take 1 kit (200 g total) by mouth once.  1 kit  0  . nitroGLYCERIN (NITROSTAT) 0.4 MG SL tablet Place 1 tablet (0.4 mg total) under the tongue every 5 (five) minutes as needed for chest pain.  25 tablet  2  . Omega-3 Fatty Acids (FISH OIL ULTRA) 1000 MG CAPS Take 2 capsules (2,000 mg total) by mouth daily.  30 capsule  6  . OVER THE COUNTER MEDICATION Take 1 tablet by mouth daily. Natural E Complete from [employer] - contains Vitamin E 30 units and selenium 10 mcg      . pantoprazole (PROTONIX) 40 MG tablet Take 1 tablet (40 mg total) by mouth daily.  30 tablet  11  . pravastatin (PRAVACHOL) 80 MG tablet Take 1 tablet (80 mg total) by mouth daily.  30 tablet  6  . isosorbide mononitrate (IMDUR) 30 MG 24 hr tablet  Take 1 tablet (30 mg total) by mouth daily.  30 tablet  6   No current facility-administered medications for this visit.    BP 154/82  Pulse 59  Ht 5' 5"  (1.651 m)  Wt 166 lb 6.4 oz (75.479 kg)  BMI 27.69 kg/m2 General: NAD Neck: No JVD, no thyromegaly or thyroid nodule.  Lungs: Clear to auscultation bilaterally with normal respiratory effort. CV: Nondisplaced PMI.  Heart regular S1/S2, no S3/S4, no murmur.  No peripheral edema.  No carotid bruit.  Normal pedal pulses.  Abdomen: Soft, nontender, no hepatosplenomegaly, no distention.  Skin: Intact without lesions or rashes.  Neurologic: Alert and oriented x 3.  Psych: Normal affect. Extremities: No clubbing or cyanosis.   Assessment/Plan: 1. CAD: Stable s/p PCI to LCx and RCA.  EF low normal on echo.  He is on ASA 81, Plavix 75, metoprolol, ACEI, and pravastatin.  I would continue Plavix long-term as long as he does not develop bleeding problems.  Given his episode of chest pain with walking, I will start him on Imdur 30 mg daily.  He will call if he has any further chest pain episodes.  2. PAD: Penetrating ulcer left CIA and chronic SMA dissection.  Last CTA abdomen did not show any changes in the appearance of the SMA.  Patient has followup with vascular surgery and with GI.  He is not having much abdominal pain at this time.  3. Smoking: He is staying off cigarettes.  4. Hyperlipidemia: Unable to tolerate atorvastatin.  He is on pravastatin without symptoms.  LFTs had been elevated, but most recently, LFTs back to normal and LDL acceptable. 5. HTN: BP too high, will increase lisinopril to 40 mg daily.  6. AAA: Will be followed by [facility].   Loralie Champagne 06/01/2014

## 2014-06-03 ENCOUNTER — Telehealth: Payer: Self-pay | Admitting: Cardiology

## 2014-06-03 NOTE — Telephone Encounter (Signed)
New message     Want to let Dr Aundra Dubin know that pt stopped taking new medications recently prescribed because he was having headaches.

## 2014-06-03 NOTE — Telephone Encounter (Signed)
Spoke with patient's son. He was having headaches Amodei he stopped his Isosorbide and Lisinopril. Advised that the isosorbide was probably causing the headaches. He will have him take 2 extra strength Tylenol with the Isosorbide and return back on the Lisinopril. Advised that he may need the Tylenol for the next week or two for headaches until he adjusts to the new medication. Patient's son verbalized understanding. Will let Dr.McLean know that he called.

## 2014-06-03 NOTE — Telephone Encounter (Signed)
Left message to call back and ask for triage

## 2014-07-07 ENCOUNTER — Other Ambulatory Visit (INDEPENDENT_AMBULATORY_CARE_PROVIDER_SITE_OTHER): Payer: Medicare Other | Admitting: *Deleted

## 2014-07-07 ENCOUNTER — Ambulatory Visit: Payer: Medicare Other

## 2014-07-07 ENCOUNTER — Encounter: Payer: Medicare Other | Admitting: Gastroenterology

## 2014-07-07 ENCOUNTER — Ambulatory Visit (INDEPENDENT_AMBULATORY_CARE_PROVIDER_SITE_OTHER): Payer: Medicare Other

## 2014-07-07 ENCOUNTER — Ambulatory Visit (INDEPENDENT_AMBULATORY_CARE_PROVIDER_SITE_OTHER): Payer: Medicare Other | Admitting: Internal Medicine

## 2014-07-07 VITALS — BP 150/82 | HR 67 | Temp 98.2°F | Resp 18 | Ht 65.5 in | Wt 168.4 lb

## 2014-07-07 DIAGNOSIS — R112 Nausea with vomiting, unspecified: Secondary | ICD-10-CM

## 2014-07-07 DIAGNOSIS — R079 Chest pain, unspecified: Secondary | ICD-10-CM

## 2014-07-07 DIAGNOSIS — K5900 Constipation, unspecified: Secondary | ICD-10-CM | POA: Diagnosis not present

## 2014-07-07 DIAGNOSIS — R109 Unspecified abdominal pain: Secondary | ICD-10-CM

## 2014-07-07 DIAGNOSIS — I200 Unstable angina: Secondary | ICD-10-CM

## 2014-07-07 DIAGNOSIS — E785 Hyperlipidemia, unspecified: Secondary | ICD-10-CM | POA: Diagnosis not present

## 2014-07-07 DIAGNOSIS — T148 Other injury of unspecified body region: Secondary | ICD-10-CM | POA: Diagnosis not present

## 2014-07-07 DIAGNOSIS — E118 Type 2 diabetes mellitus with unspecified complications: Secondary | ICD-10-CM

## 2014-07-07 DIAGNOSIS — R531 Weakness: Secondary | ICD-10-CM | POA: Diagnosis not present

## 2014-07-07 LAB — POCT URINALYSIS DIPSTICK
Bilirubin, UA: NEGATIVE
Glucose, UA: 500
Ketones, UA: NEGATIVE
Leukocytes, UA: NEGATIVE
Nitrite, UA: NEGATIVE
Protein, UA: NEGATIVE
Spec Grav, UA: 1.02
Urobilinogen, UA: 0.2
pH, UA: 6.5

## 2014-07-07 LAB — HEPATIC FUNCTION PANEL
ALT: 71 U/L — ABNORMAL HIGH (ref 0–53)
AST: 64 U/L — ABNORMAL HIGH (ref 0–37)
Albumin: 3.6 g/dL (ref 3.5–5.2)
Alkaline Phosphatase: 85 U/L (ref 39–117)
Bilirubin, Direct: 0 mg/dL (ref 0.0–0.3)
Total Bilirubin: 0.8 mg/dL (ref 0.2–1.2)
Total Protein: 7.9 g/dL (ref 6.0–8.3)

## 2014-07-07 LAB — POCT CBC
Granulocyte percent: 63.5 %G (ref 37–80)
HCT, POC: 45 % (ref 43.5–53.7)
Hemoglobin: 14.6 g/dL (ref 14.1–18.1)
Lymph, poc: 2.1 (ref 0.6–3.4)
MCH, POC: 28.6 pg (ref 27–31.2)
MCHC: 32.4 g/dL (ref 31.8–35.4)
MCV: 88.4 fL (ref 80–97)
MID (cbc): 0.7 (ref 0–0.9)
MPV: 7.9 fL (ref 0–99.8)
POC Granulocyte: 4.9 (ref 2–6.9)
POC LYMPH PERCENT: 27.7 %L (ref 10–50)
POC MID %: 8.8 %M (ref 0–12)
Platelet Count, POC: 190 10*3/uL (ref 142–424)
RBC: 5.09 M/uL (ref 4.69–6.13)
RDW, POC: 14 %
WBC: 7.7 10*3/uL (ref 4.6–10.2)

## 2014-07-07 LAB — LIPID PANEL
Cholesterol: 126 mg/dL (ref 0–200)
HDL: 26 mg/dL — ABNORMAL LOW (ref >39.00)
LDL Cholesterol: 76 mg/dL (ref 0–99)
NonHDL: 100
Total CHOL/HDL Ratio: 5
Triglycerides: 118 mg/dL (ref 0.0–149.0)
VLDL: 23.6 mg/dL (ref 0.0–40.0)

## 2014-07-07 LAB — POCT UA - MICROSCOPIC ONLY
Casts, Ur, LPF, POC: NEGATIVE
Crystals, Ur, HPF, POC: NEGATIVE
Mucus, UA: NEGATIVE
Yeast, UA: NEGATIVE

## 2014-07-07 LAB — POCT GLYCOSYLATED HEMOGLOBIN (HGB A1C): Hemoglobin A1C: 7.1

## 2014-07-07 LAB — GLUCOSE, POCT (MANUAL RESULT ENTRY): POC Glucose: 130 mg/dl — AB (ref 70–99)

## 2014-07-07 MED ORDER — POLYETHYLENE GLYCOL 3350 17 GM/SCOOP PO POWD: 17.0000 g | Freq: Two times a day (BID) | ORAL | Status: DC | PRN

## 2014-07-07 MED ORDER — ONDANSETRON 8 MG PO TBDP: 8.0000 mg | ORAL_TABLET | Freq: Three times a day (TID) | ORAL | Status: DC | PRN

## 2014-07-07 MED ORDER — PROMETHAZINE HCL 25 MG/ML IJ SOLN
25.0000 mg | Freq: Once | INTRAMUSCULAR | Status: AC
  Administered 2014-07-07: 25 mg via INTRAMUSCULAR

## 2014-07-07 NOTE — Progress Notes (Signed)
Subjective:    Patient ID: Barry [name], male    DOB: [birth year], 63 y.o.   MRN: [id number]  HPI 63 year old vietnamese gentleman here with his wife Pt has cc of acute vomiting an dizziness  Onset of symptoms acutely at 1pm after he ate lunch after going for a routine visit to his drs office to get a routine blood test. He did not see the dr.  His wife called ems and the paramedic checked his sugar which he was told was normal and he refused transport to the er because he felt better. Now he continues to have nausea Mahurin he came [facility]. He has not had a bm in 3 days, mild crampy intermittent abdominal pain.  No fever, no dysuria, no chest pain, no shortness of breath,  Has been compliant with his medications fo r his chronic medical problems including his diabetes. Htn, cad with stent cva, chf, aneurysm of mesenteric atery and iliac artery.    Review of Systems  Constitutional: Positive for fatigue. Negative for fever.  HENT: Negative.   Eyes: Negative.   Respiratory: Negative.   Cardiovascular: Negative.   Gastrointestinal: Positive for nausea, vomiting and constipation. Negative for abdominal pain and abdominal distention.  Endocrine: Negative.   Genitourinary: Negative.   Musculoskeletal: Negative.   Skin: Negative.   Neurological: Negative.   Hematological: Negative.   Psychiatric/Behavioral: Negative.   All other systems reviewed and are negative.      Objective:   Physical Exam  Nursing note and vitals reviewed. Constitutional: He is oriented to person, place, and time. He appears well-developed and well-nourished.  HENT:  Head: Normocephalic.  Right Ear: External ear normal.  Left Ear: External ear normal.  Nose: Nose normal.  Mouth/Throat: Oropharynx is clear and moist.  Eyes: Conjunctivae are normal. Pupils are equal, round, and reactive to light.  Neck: Normal range of motion. Neck supple.  Cardiovascular: Normal rate, regular rhythm, normal heart sounds and  intact distal pulses.   Pulmonary/Chest: Effort normal and breath sounds normal.  Abdominal: Soft. Bowel sounds are normal. He exhibits distension. There is tenderness. There is no rebound and no guarding.  Slight tenderness in the midepigastrium but soft, no guarding or rebound,bs are normal  Musculoskeletal: Normal range of motion.  Neurological: He is alert and oriented to person, place, and time.  Skin: Skin is warm and dry.  Psychiatric: He has a normal mood and affect. His behavior is normal. Judgment and thought content normal.   Results for orders placed in visit on 07/07/14  LIPID PANEL      Result Value Ref Range   Cholesterol 126  0 - 200 mg/dL   Triglycerides 118.0  0.0 - 149.0 mg/dL   HDL 26.00 (*) >39.00 mg/dL   VLDL 23.6  0.0 - 40.0 mg/dL   LDL Cholesterol 76  0 - 99 mg/dL   Total CHOL/HDL Ratio 5     NonHDL 100.00    HEPATIC FUNCTION PANEL      Result Value Ref Range   Total Bilirubin 0.8  0.2 - 1.2 mg/dL   Bilirubin, Direct 0.0  0.0 - 0.3 mg/dL   Alkaline Phosphatase 85  39 - 117 U/L   AST 64 (*) 0 - 37 U/L   ALT 71 (*) 0 - 53 U/L   Total Protein 7.9  6.0 - 8.3 g/dL   Albumin 3.6  3.5 - 5.2 g/dL   Results for orders placed in visit on 07/07/14  POCT  URINALYSIS DIPSTICK      Result Value Ref Range   Color, UA yellow     Clarity, UA clear     Glucose, UA 500     Bilirubin, UA neg     Ketones, UA neg     Spec Grav, UA 1.020     Blood, UA small     pH, UA 6.5     Protein, UA neg     Urobilinogen, UA 0.2     Nitrite, UA neg     Leukocytes, UA Negative    POCT UA - MICROSCOPIC ONLY      Result Value Ref Range   WBC, Ur, HPF, POC 0-1     RBC, urine, microscopic 2-4     Bacteria, U Microscopic trace     Mucus, UA neg     Epithelial cells, urine per micros 0-1     Crystals, Ur, HPF, POC neg     Casts, Ur, LPF, POC neg     Yeast, UA neg    POCT CBC      Result Value Ref Range   WBC 7.7  4.6 - 10.2 K/uL   Lymph, poc 2.1  0.6 - 3.4   POC LYMPH PERCENT  27.7  10 - 50 %L   MID (cbc) 0.7  0 - 0.9   POC MID % 8.8  0 - 12 %M   POC Granulocyte 4.9  2 - 6.9   Granulocyte percent 63.5  37 - 80 %G   RBC 5.09  4.69 - 6.13 M/uL   Hemoglobin 14.6  14.1 - 18.1 g/dL   HCT, POC 45.0  43.5 - 53.7 %   MCV 88.4  80 - 97 fL   MCH, POC 28.6  27 - 31.2 pg   MCHC 32.4  31.8 - 35.4 g/dL   RDW, POC 14.0     Platelet Count, POC 190  142 - 424 K/uL   MPV 7.9  0 - 99.8 fL  POCT GLYCOSYLATED HEMOGLOBIN (HGB A1C)      Result Value Ref Range   Hemoglobin A1C 7.1    GLUCOSE, POCT (MANUAL RESULT ENTRY)      Result Value Ref Range   POC Glucose 130 (*) 70 - 99 mg/dl   ekg nsr with hr of 56 qs v1 through v2 consistent with awmi age unknown, intervals are within normal limits with a widenening of the  qrs to  .10  There are nonspecific st t changes in the inferior leads.   [facility] reading (PRIMARY) by  Dr. Benjaman Lobe abdominal 3 way xray including chest shows nonspecific bowel gas pattern with heavy stool burden on the right side of the colon. No obstruction no free air.     Assessment & Plan:  63 year old gentleman with many comorbid medical problems presents with acute onset of nausea and vomiting with some mild dizziness after vomiting with abdominal soreness and constipation of 3 days duration. Will access labs, ekg , xrays urine sugar to determine need for IVhydration and meds if necessary. Labs reviewed and glucose is only slightly elva ted with a hgb a1c that is well controlled.  Will gives phenergan in the office and prescribe laxatives for his constipation. Patient instructed to return to the office if vomiting continues if he has abdominal pain or if he is not having a bm. He is to continue his routine meds.

## 2014-07-07 NOTE — Patient Instructions (Signed)
Use zofran as directed for nausea or vomiting. You do not have to finish this medication it is only to relieve the symptoms of nausea or vomiting. Use miralax 1 to 2 capfuls a day if needed for constipation. Return to the office if you continue to have vomiting, increased pain ,no bowel movement, any chest pain or fever.

## 2014-07-08 ENCOUNTER — Telehealth: Payer: Self-pay | Admitting: Cardiology

## 2014-07-08 DIAGNOSIS — E785 Hyperlipidemia, unspecified: Secondary | ICD-10-CM

## 2014-07-08 DIAGNOSIS — I25119 Atherosclerotic heart disease of native coronary artery with unspecified angina pectoris: Secondary | ICD-10-CM

## 2014-07-08 MED ORDER — PRAVASTATIN SODIUM 40 MG PO TABS: 40.0000 mg | ORAL_TABLET | Freq: Every evening | ORAL | Status: DC

## 2014-07-08 NOTE — Telephone Encounter (Signed)
LFT's elevated on lab work from yesterday. I called the patient's son, who speaks [place], and discussed results. I also made him aware of recommendations from Ignacia Bayley, NP that the patient needs to decrease pravastatin to 40 mg daily and repeat his LFT's in 2 weeks. He verbalizes understanding. He would like a new RX sent in for the 40 mg tablets on the pravastatin as this will be easier for his mother to understand. RX will be sent to [facility] on [address]. The patient's son will call back with a date for the week of 10/26 that would be good for the patient to come in for lab work.

## 2014-08-04 ENCOUNTER — Other Ambulatory Visit: Payer: Self-pay | Admitting: Family Medicine

## 2014-08-28 ENCOUNTER — Encounter: Payer: Self-pay | Admitting: Physician Assistant

## 2014-08-29 ENCOUNTER — Encounter: Payer: Self-pay | Admitting: Physician Assistant

## 2014-09-04 ENCOUNTER — Encounter (HOSPITAL_COMMUNITY): Payer: Self-pay | Admitting: Cardiovascular Disease

## 2014-09-06 ENCOUNTER — Other Ambulatory Visit: Payer: Self-pay | Admitting: Physician Assistant

## 2014-09-09 ENCOUNTER — Telehealth: Payer: Self-pay | Admitting: Family Medicine

## 2014-09-09 NOTE — Telephone Encounter (Signed)
Patient declined flu shot  °

## 2014-09-26 HISTORY — PX: POLYPECTOMY: SHX149

## 2014-09-26 HISTORY — PX: COLONOSCOPY: SHX174

## 2014-10-06 ENCOUNTER — Ambulatory Visit: Payer: Medicare Other | Admitting: Physician Assistant

## 2014-10-13 ENCOUNTER — Ambulatory Visit (INDEPENDENT_AMBULATORY_CARE_PROVIDER_SITE_OTHER): Payer: Medicare Other

## 2014-10-13 ENCOUNTER — Ambulatory Visit (INDEPENDENT_AMBULATORY_CARE_PROVIDER_SITE_OTHER): Payer: Medicare Other | Admitting: Emergency Medicine

## 2014-10-13 VITALS — BP 134/82 | HR 97 | Temp 100.1°F | Resp 18 | Ht 65.0 in | Wt 174.0 lb

## 2014-10-13 DIAGNOSIS — R6889 Other general symptoms and signs: Secondary | ICD-10-CM | POA: Diagnosis not present

## 2014-10-13 DIAGNOSIS — H7291 Unspecified perforation of tympanic membrane, right ear: Secondary | ICD-10-CM | POA: Diagnosis not present

## 2014-10-13 DIAGNOSIS — J029 Acute pharyngitis, unspecified: Secondary | ICD-10-CM

## 2014-10-13 DIAGNOSIS — E118 Type 2 diabetes mellitus with unspecified complications: Secondary | ICD-10-CM

## 2014-10-13 LAB — POCT CBC
Granulocyte percent: 78.6 %G (ref 37–80)
HCT, POC: 46.2 % (ref 43.5–53.7)
Hemoglobin: 15 g/dL (ref 14.1–18.1)
Lymph, poc: 1.5 (ref 0.6–3.4)
MCH, POC: 28.5 pg (ref 27–31.2)
MCHC: 32.4 g/dL (ref 31.8–35.4)
MCV: 87.9 fL (ref 80–97)
MID (cbc): 0.7 (ref 0–0.9)
MPV: 7.7 fL (ref 0–99.8)
POC Granulocyte: 8.2 — AB (ref 2–6.9)
POC LYMPH PERCENT: 14.7 %L (ref 10–50)
POC MID %: 6.7 %M (ref 0–12)
Platelet Count, POC: 187 10*3/uL (ref 142–424)
RBC: 5.26 M/uL (ref 4.69–6.13)
RDW, POC: 14.3 %
WBC: 10.4 10*3/uL — AB (ref 4.6–10.2)

## 2014-10-13 LAB — COMPREHENSIVE METABOLIC PANEL
ALT: 49 U/L (ref 0–53)
AST: 32 U/L (ref 0–37)
Albumin: 4.4 g/dL (ref 3.5–5.2)
Alkaline Phosphatase: 117 U/L (ref 39–117)
BUN: 14 mg/dL (ref 6–23)
CO2: 29 mEq/L (ref 19–32)
Calcium: 9.7 mg/dL (ref 8.4–10.5)
Chloride: 102 mEq/L (ref 96–112)
Creat: 1.15 mg/dL (ref 0.50–1.35)
Glucose, Bld: 164 mg/dL — ABNORMAL HIGH (ref 70–99)
Potassium: 4.3 mEq/L (ref 3.5–5.3)
Sodium: 139 mEq/L (ref 135–145)
Total Bilirubin: 0.8 mg/dL (ref 0.2–1.2)
Total Protein: 7.5 g/dL (ref 6.0–8.3)

## 2014-10-13 LAB — POCT INFLUENZA A/B
Influenza A, POC: NEGATIVE
Influenza B, POC: NEGATIVE

## 2014-10-13 LAB — POCT GLYCOSYLATED HEMOGLOBIN (HGB A1C): Hemoglobin A1C: 9

## 2014-10-13 LAB — GLUCOSE, POCT (MANUAL RESULT ENTRY): POC Glucose: 178 mg/dl — AB (ref 70–99)

## 2014-10-13 LAB — POCT RAPID STREP A (OFFICE): Rapid Strep A Screen: NEGATIVE

## 2014-10-13 MED ORDER — AMOXICILLIN 875 MG PO TABS: 875.0000 mg | ORAL_TABLET | Freq: Two times a day (BID) | ORAL | Status: DC

## 2014-10-13 NOTE — Patient Instructions (Signed)
Otitis Media Otitis media is redness, soreness, and inflammation of the middle ear. Otitis media may be caused by allergies or, most commonly, by infection. Often it occurs as a complication of the common cold. SIGNS AND SYMPTOMS Symptoms of otitis media may include:  Earache.  Fever.  Ringing in your ear.  Headache.  Leakage of fluid from the ear. DIAGNOSIS To diagnose otitis media, your health care provider will examine your ear with an otoscope. This is an instrument that allows your health care provider to see into your ear in order to examine your eardrum. Your health care provider also will ask you questions about your symptoms. TREATMENT  Typically, otitis media resolves on its own within 3-5 days. Your health care provider may prescribe medicine to ease your symptoms of pain. If otitis media does not resolve within 5 days or is recurrent, your health care provider may prescribe antibiotic medicines if he or she suspects that a bacterial infection is the cause. HOME CARE INSTRUCTIONS   If you were prescribed an antibiotic medicine, finish it all even if you start to feel better.  Take medicines only as directed by your health care provider.  Keep all follow-up visits as directed by your health care provider. SEEK MEDICAL CARE IF:  You have otitis media only in one ear, or bleeding from your nose, or both.  You notice a lump on your neck.  You are not getting better in 3-5 days.  You feel worse instead of better. SEEK IMMEDIATE MEDICAL CARE IF:   You have pain that is not controlled with medicine.  You have swelling, redness, or pain around your ear or stiffness in your neck.  You notice that part of your face is paralyzed.  You notice that the bone behind your ear (mastoid) is tender when you touch it. MAKE SURE YOU:   Understand these instructions.  Will watch your condition.  Will get help right away if you are not doing well or get worse. Document Released:  06/17/2004 Document Revised: 01/27/2014 Document Reviewed: 04/09/2013 [facility] Patient Information 2015 [place], [place]. This information is not intended to replace advice given to you by your health care provider. Make sure you discuss any questions you have with your health care provider. Viral Infections A virus is a type of germ. Viruses can cause:  Minor sore throats.  Aches and pains.  Headaches.  Runny nose.  Rashes.  Watery eyes.  Tiredness.  Coughs.  Loss of appetite.  Feeling sick to your stomach (nausea).  Throwing up (vomiting).  Watery poop (diarrhea). HOME CARE   Only take medicines as told by your doctor.  Drink enough water and fluids to keep your pee (urine) clear or pale yellow. Sports drinks are a good choice.  Get plenty of rest and eat healthy. Soups and broths with crackers or rice are fine. GET HELP RIGHT AWAY IF:   You have a very bad headache.  You have shortness of breath.  You have chest pain or neck pain.  You have an unusual rash.  You cannot stop throwing up.  You have watery poop that does not stop.  You cannot keep fluids down.  You or your child has a temperature by mouth above 102 F (38.9 C), not controlled by medicine.  Your baby is older than 3 months with a rectal temperature of 102 F (38.9 C) or higher.  Your baby is 87 months old or younger with a rectal temperature of 100.4 F (38 C)  or higher. MAKE SURE YOU:   Understand these instructions.  Will watch this condition.  Will get help right away if you are not doing well or get worse. Document Released: 08/25/2008 Document Revised: 12/05/2011 Document Reviewed: 01/18/2011 [facility] Patient Information 2015 [place], [place]. This information is not intended to replace advice given to you by your health care provider. Make sure you discuss any questions you have with your health care provider.

## 2014-10-13 NOTE — Progress Notes (Addendum)
Subjective:    Patient ID: Barry [name], male    DOB: 11-22-1950, 64 y.o.   MRN: [id number] This chart was scribed for Arlyss Queen, MD by Marti Sleigh, Medical Scribe. This patient was seen in Room 14 and the patient's care was started at 11:28 AM.  Chief Complaint  Patient presents with  . Fever    since yesterday   . Hyperglycemia    216 this am  . Hypertension    HPI HPI Comments: Pt speaks very little english. Pt's daughter translated. Barry [name] is a 64 y.o. male with a hx of MI, CVA, HTN, TIA, CHF and dissection of the mesenteric artery who presents to [facility] complaining of fever and sore throat that started yesterday. Pt also endorses associated cough, chills, nausea, ear pain and back pain. Pt denies sick contacts. Pt states his blood pressure and blood sugar are elevated. Pt does not have a PCP. Pt states his medications make his mouth is dry, and would like to consider an injection instead of pills. Pt has not had a flu shot this year.     Review of Systems  Constitutional: Positive for fever and chills.  HENT: Positive for congestion, rhinorrhea, sinus pressure and sore throat.   Respiratory: Positive for cough.   Gastrointestinal: Positive for nausea.  Musculoskeletal: Positive for back pain.       Objective:   Physical Exam  Constitutional: He is oriented to person, place, and time. He appears well-developed and well-nourished.  Elderly male who is not toxic.  HENT:  Head: Normocephalic and atraumatic.  Left TM normal, right TM with large chronic perforation with purulance above perforation. Throat red without exudate. Significant clear rhinorrhea.  Eyes: Pupils are equal, round, and reactive to light.  Neck: Neck supple. No JVD present.  Cardiovascular: Normal rate and regular rhythm.   Pulmonary/Chest: Effort normal and breath sounds normal. No respiratory distress.  Musculoskeletal: Normal range of motion.  Neurological: He is alert and oriented to person, place,  and time.  Skin: Skin is warm and dry.  Psychiatric: He has a normal mood and affect. His behavior is normal.  Nursing note and vitals reviewed.  Results for orders placed or performed in visit on 10/13/14  POCT CBC  Result Value Ref Range   WBC 10.4 (A) 4.6 - 10.2 K/uL   Lymph, poc 1.5 0.6 - 3.4   POC LYMPH PERCENT 14.7 10 - 50 %L   MID (cbc) 0.7 0 - 0.9   POC MID % 6.7 0 - 12 %M   POC Granulocyte 8.2 (A) 2 - 6.9   Granulocyte percent 78.6 37 - 80 %G   RBC 5.26 4.69 - 6.13 M/uL   Hemoglobin 15.0 14.1 - 18.1 g/dL   HCT, POC 46.2 43.5 - 53.7 %   MCV 87.9 80 - 97 fL   MCH, POC 28.5 27 - 31.2 pg   MCHC 32.4 31.8 - 35.4 g/dL   RDW, POC 14.3 %   Platelet Count, POC 187 142 - 424 K/uL   MPV 7.7 0 - 99.8 fL  POCT Influenza A/B  Result Value Ref Range   Influenza A, POC Negative    Influenza B, POC Negative   POCT rapid strep A  Result Value Ref Range   Rapid Strep A Screen Negative Negative  POCT glucose (manual entry)  Result Value Ref Range   POC Glucose 178 (A) 70 - 99 mg/dl  [facility] reading (PRIMARY) by  Dr.Verle Wheeling heart size upper limits  no infiltrates seen       Assessment & Plan:  His white count is elevated at 10 4. He does have a infected right ear which may be chronic. Flu test negative, strep neg. Patient is high risk due to his significant atherosclerotic vascular disease and his diabetes.Burnis Medin cover with amoxicillin 875 twice a day. He also has an infected right ear and this should give coverage for that.He has a followup with the cardiologist on the 27th.I personally performed the services described in this documentation, which was scribed in my presence. The recorded information has been reviewed and is accurate.   nt}

## 2014-10-15 ENCOUNTER — Ambulatory Visit: Payer: Medicare Other | Admitting: Physician Assistant

## 2014-10-20 ENCOUNTER — Other Ambulatory Visit: Payer: Self-pay | Admitting: Physician Assistant

## 2014-10-22 ENCOUNTER — Ambulatory Visit (INDEPENDENT_AMBULATORY_CARE_PROVIDER_SITE_OTHER): Payer: Medicare Other | Admitting: Physician Assistant

## 2014-10-22 ENCOUNTER — Telehealth: Payer: Self-pay | Admitting: Physician Assistant

## 2014-10-22 ENCOUNTER — Encounter: Payer: Self-pay | Admitting: Physician Assistant

## 2014-10-22 VITALS — BP 130/80 | HR 63 | Ht 65.0 in | Wt 172.0 lb

## 2014-10-22 DIAGNOSIS — K551 Chronic vascular disorders of intestine: Secondary | ICD-10-CM

## 2014-10-22 DIAGNOSIS — I2510 Atherosclerotic heart disease of native coronary artery without angina pectoris: Secondary | ICD-10-CM | POA: Diagnosis not present

## 2014-10-22 DIAGNOSIS — H40033 Anatomical narrow angle, bilateral: Secondary | ICD-10-CM | POA: Diagnosis not present

## 2014-10-22 DIAGNOSIS — R42 Dizziness and giddiness: Secondary | ICD-10-CM

## 2014-10-22 DIAGNOSIS — R079 Chest pain, unspecified: Secondary | ICD-10-CM

## 2014-10-22 DIAGNOSIS — I10 Essential (primary) hypertension: Secondary | ICD-10-CM

## 2014-10-22 DIAGNOSIS — E785 Hyperlipidemia, unspecified: Secondary | ICD-10-CM | POA: Diagnosis not present

## 2014-10-22 DIAGNOSIS — E119 Type 2 diabetes mellitus without complications: Secondary | ICD-10-CM | POA: Diagnosis not present

## 2014-10-22 LAB — BASIC METABOLIC PANEL
BUN: 18 mg/dL (ref 6–23)
CO2: 31 mEq/L (ref 19–32)
Calcium: 10 mg/dL (ref 8.4–10.5)
Chloride: 100 mEq/L (ref 96–112)
Creatinine, Ser: 1.16 mg/dL (ref 0.40–1.50)
GFR: 67.21 mL/min (ref >60.00)
Glucose, Bld: 193 mg/dL — ABNORMAL HIGH (ref 70–99)
Potassium: 4.1 mEq/L (ref 3.5–5.1)
Sodium: 137 mEq/L (ref 135–145)

## 2014-10-22 LAB — CBC WITH DIFFERENTIAL/PLATELET
Basophils Absolute: 0 10*3/uL (ref 0.0–0.1)
Basophils Relative: 0.4 % (ref 0.0–3.0)
Eosinophils Absolute: 0.3 10*3/uL (ref 0.0–0.7)
Eosinophils Relative: 3.1 % (ref 0.0–5.0)
HCT: 45.8 % (ref 39.0–52.0)
Hemoglobin: 15.1 g/dL (ref 13.0–17.0)
Lymphocytes Relative: 23.6 % (ref 12.0–46.0)
Lymphs Abs: 1.9 10*3/uL (ref 0.7–4.0)
MCHC: 33.1 g/dL (ref 30.0–36.0)
MCV: 85.5 fl (ref 78.0–100.0)
Monocytes Absolute: 0.6 10*3/uL (ref 0.1–1.0)
Monocytes Relative: 7.5 % (ref 3.0–12.0)
Neutro Abs: 5.3 10*3/uL (ref 1.4–7.7)
Neutrophils Relative %: 65.4 % (ref 43.0–77.0)
Platelets: 244 10*3/uL (ref 150.0–400.0)
RBC: 5.36 Mil/uL (ref 4.22–5.81)
RDW: 14.1 % (ref 11.5–15.5)
WBC: 8.2 10*3/uL (ref 4.0–10.5)

## 2014-10-22 MED ORDER — ISOSORBIDE MONONITRATE ER 30 MG PO TB24
15.0000 mg | ORAL_TABLET | Freq: Every day | ORAL | Status: DC
Start: 2014-10-22 — End: 2015-02-12

## 2014-10-22 MED ORDER — LISINOPRIL 40 MG PO TABS
40.0000 mg | ORAL_TABLET | Freq: Every day | ORAL | Status: DC
Start: 2014-10-22 — End: 2014-10-24

## 2014-10-22 NOTE — Telephone Encounter (Signed)
New message     Pt was seen today.  Cannot find imdur (among other presc at home---I think).  Please call it in to [place] at [facility].  Please call and let her know it was called in

## 2014-10-22 NOTE — Telephone Encounter (Signed)
S/w daughter ESA due to pt does not speak [place]. She stated pt is taking lisinopril 40 mg daily, NOT taking imdur. I advised will d/w PA and cb w/recommendations. Per Sarepta PA stay on lisinopril 40, start Imdur 15 mg daily. Daughter verbalized Plan

## 2014-10-22 NOTE — Progress Notes (Signed)
Cardiology Office Note   Date:  10/22/2014   ID:  Barry [name], DOB 1951-04-24, MRN [id number]  PCP:  Lamar Blinks, MD  Cardiologist:  Dr. Loralie Champagne    Chief Complaint  Patient presents with  . Coronary Artery Disease    4 [place] F/U  . Chest Pain  . Dizziness     History of Present Illness: Barry [name] is a 64 y.o. [place] male who presents for the above.  He has a history of CAD, chronic superior mesenteric artery dissection, prior stroke, DM 2, HTN, CKD, HL.  He originally presented in 02/2014 with non-STEMI. LHC demonstrated a ruptured plaque and thrombus in the proximal RCA which was treated with a DES. He was readmitted several weeks later with unstable angina and LHC demonstrated high-grade stenosis in the CFX which was treated with a DES. Last seen by Dr. Aundra Dubin 05/2014. He noted some chest discomfort with walking and was placed on isosorbide. ACE inhibitor was increased secondary to uncontrolled hypertension.   History is obtained today with the assistance of an interpreter. Patient does not speak [place]. He has a lot of symptoms that are difficult to discern. He tells me he's had a left-sided chest discomfort described as "hot." This has been coming and going for about a year. He denies exertional symptoms. He denies exertional shortness of breath. He exercises on a daily basis. He does tell me that he has dizziness with standing as well as after exertion. He takes nitroglycerin, quite often, for his symptoms of chest discomfort as well as his symptoms of dizziness. He reports relief with all of these.   His medications are suspect. He is on 2 different statins. Records indicate he has had an intolerance to statins in the past and only tolerated pravastatin.  I spent a significant amount of time looking at his medications and reviewing them with him through the interpreter. He was convinced that he is not taking isosorbide as well as lisinopril. After reviewing his medications  at home (after leaving the office), he called back and notified us that he was taking lisinopril.  He seemed to report sleeping on an incline recently. He denies PND, edema. He denies syncope.   Studies/Reports Reviewed Today:  - LHC (02/26/14):  mLAD 20 (faint L>R collats), mCFX 50, pRCA 95 (plaque rupture) >> PCI:  Xience DES to pRCA (severe tortuosity of R innominate artery >> needs L radial or FA in future)  - LHC (03/10/14):  CFX 90, oOM 70, pRCA stent patent >> PCI:  Xience DES to mCFX  - Echocardiogram (02/27/14):  Mod focal basal and mild concentric LVH, EF 50-55%, no RWMA, Gr 1 DD, mild TR, normal RVF  - Carotid US (02/27/14):  Bilateral ICA 1-39%  - Mesenteric Artery Duplex (7/15):  pSMA chronic dissection with aneurysmal dilation of 1.29 cm ([facility]) Chest CTA (02/26/14):  IMPRESSION:  1. No aortic dissection or other acute abnormality.  2. 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  3. Stable 17 mm ectasia of the left common iliac artery.  4. Atherosclerosis, including aortoiliac and coronary artery disease. Please note that although the presence of coronary artery calcium documents the presence of coronary artery disease, the severity of this disease and any potential stenosis cannot be assessed on this non-gated CT examination. Assessment for potential risk factor modification, dietary therapy or pharmacologic therapy may be warranted, if clinically indicated.   PMH: 1. HTN 2. Type II diabetes 3. CVA in 2011 4. AAA: 2.2 cm in  3/15.  5. PAD: Left CIA with penetrating ulcer. 6. Chronic superior mesenteric artery dissection, followed by Dr Trula Slade. 7. CKD 8. CAD: NSTEMI 6/15 with 95% proximal RCA stenosis treated with DES. Unstable angina later in 6/15, this time had DES to 90% mLCx. Echo (6/15) with EF 50-55%, moderate focal basal septal hypertrophy, normal RV size and systolic function.  9. Carotid dopplers (6/15) with mild stenosis.  10. Hyperlipidemia: Did not tolerate  atorvastatin. Past Medical History  Diagnosis Date  . Hypertension   . Diabetes mellitus   . Stroke   . Iliac artery aneurysm, left   . Dissection of mesenteric artery   . CAD (coronary artery disease)   . Myocardial infarction     Past Surgical History  Procedure Laterality Date  . Admission  09/26/2009    [facility].  [name].  . Cardiac catheterization    . Coronary angioplasty    . Left heart catheterization with coronary angiogram N/A 02/26/2014    Procedure: LEFT HEART CATHETERIZATION WITH CORONARY ANGIOGRAM;  Surgeon: Wellington Hampshire, MD;  Location: [place] [facility] CATH LAB;  Service: Cardiovascular;  Laterality: N/A;  . Left heart catheterization with coronary angiogram N/A 03/10/2014    Procedure: LEFT HEART CATHETERIZATION WITH CORONARY ANGIOGRAM;  Surgeon: Jettie Booze, MD;  Location: [facility] CATH LAB;  Service: Cardiovascular;  Laterality: N/A;     Current Outpatient Prescriptions  Medication Sig Dispense Refill  . amoxicillin (AMOXIL) 875 MG tablet Take 1 tablet (875 mg total) by mouth 2 (two) times daily. 20 tablet 0  . aspirin EC 81 MG EC tablet Take 1 tablet (81 mg total) by mouth daily.    Marland Kitchen atorvastatin (LIPITOR) 80 MG tablet Take 80 mg by mouth daily at 6 PM.     . Blood Glucose Monitoring Suppl (ONE TOUCH ULTRA SYSTEM KIT) W/DEVICE KIT 1 kit by Does not apply route once. One touch delica lancets, and meter Pt will test once daily. Dx 250.92 1 each 0  . clopidogrel (PLAVIX) 75 MG tablet Take 1 tablet (75 mg total) by mouth daily with breakfast. 30 tablet 11  . diphenhydrAMINE (BENADRYL) 25 MG tablet Take 50 mg by mouth daily as needed for allergies.    Marland Kitchen gabapentin (NEURONTIN) 300 MG capsule TAKE ONE CAPSULE BY MOUTH THREE TIMES DAILY NEED  OFFICE  VISIT  FOR  ADDITIONAL  REFILLS 90 capsule 1  . glucose blood test strip Test blood sugar daily. Dx code: 70.92. (Patient taking differently: 1 each by Other route daily. Test blood sugar daily. Dx code: 250.92.) 100 each 3  .  metFORMIN (GLUCOPHAGE) 500 MG tablet Take 1 tablet (500 mg total) by mouth 2 (two) times daily with a meal. 180 tablet 3  . metoprolol tartrate (LOPRESSOR) 25 MG tablet Take 12.5 mg by mouth 2 (two) times daily.    . nitroGLYCERIN (NITROSTAT) 0.4 MG SL tablet Place 1 tablet (0.4 mg total) under the tongue every 5 (five) minutes as needed for chest pain. 25 tablet 2  . ondansetron (ZOFRAN-ODT) 8 MG disintegrating tablet Take 1 tablet (8 mg total) by mouth every 8 (eight) hours as needed for nausea or vomiting. 20 tablet 0  . OVER THE COUNTER MEDICATION Take 1 tablet by mouth daily. Natural E Complete from Nutrilite - contains Vitamin E 30 units and selenium 10 mcg    . polyethylene glycol powder (GLYCOLAX/MIRALAX) powder Take 17 g by mouth 2 (two) times daily as needed. 3350 g 1  . pravastatin (PRAVACHOL) 40 MG tablet  Take 1 tablet (40 mg total) by mouth every evening. 30 tablet 3   No current facility-administered medications for this visit.    Allergies:   Review of patient's allergies indicates no known allergies.    Social History:  The patient  reports that he quit smoking about 5 years ago. He has never used smokeless tobacco. He reports that he does not drink alcohol or use illicit drugs.   Family History:  The patient's family history includes Diabetes in his father; Heart attack in his father; Hypertension in his father. There is no history of Stroke.    ROS:  Please see the history of present illness.   Otherwise, review of systems are positive for nonproductive cough.   All other systems are reviewed and negative.    PHYSICAL EXAM: VS:  BP 130/80 mmHg  Pulse 63  Ht 5' 5"  (1.651 m)  Wt 172 lb (78.019 kg)  BMI 28.62 kg/m2  SpO2 98%    Orthostatic VS for the past 24 hrs:  BP- Lying Pulse- Lying BP- Sitting Pulse- Sitting BP- Standing at 0 minutes Pulse- Standing at 0 minutes  10/22/14 1153 (!) 137/91 mmHg 61 146/89 mmHg 60 142/86 mmHg 64    Wt Readings from Last 3  Encounters:  10/22/14 172 lb (78.019 kg)  10/13/14 174 lb (78.926 kg)  07/07/14 168 lb 6.4 oz (76.386 kg)     GEN: Well nourished, well developed, in no acute distress HEENT: normal Neck: no JVD, no carotid bruits, no masses Cardiac:  Normal S1/S2, RRR; no murmur, no rubs or gallops, no edema  Respiratory:  clear to auscultation bilaterally, no wheezing, rhonchi or rales. GI: soft, nontender, nondistended, + BS MS: no deformity or atrophy Skin: warm and dry  Neuro:  CNs II-XII intact, Strength and sensation are intact Psych: Normal affect   EKG:  EKG is ordered today.  It demonstrates:   Sinus brady, HR 59, inf Q waves, TWI 3, aVF, no change from prior tracing   Recent Labs: 03/07/2014: Pro B Natriuretic peptide (BNP) 420.7* 03/24/2014: Platelets 350 10/13/2014: ALT 49; BUN 14; Creatinine 1.15; Hemoglobin 15.0; Potassium 4.3; Sodium 139    Lipid Panel    Component Value Date/Time   CHOL 126 07/07/2014 1203   TRIG 118.0 07/07/2014 1203   HDL 26.00* 07/07/2014 1203   CHOLHDL 5 07/07/2014 1203   VLDL 23.6 07/07/2014 1203   LDLCALC 76 07/07/2014 1203   LDLDIRECT 87.7 04/28/2014 0838      ASSESSMENT AND PLAN:  1. Chest pain:  Patient reports a very atypical chest pain. However, he takes nitroglycerin with relief. He also reports dizziness that is relieved by nitroglycerin. I'm concerned that his symptom description is lost in translation somewhere. Given his significant history of CAD, I have recommended proceeding with an exercise Myoview. 2.  Dizziness: Etiology not entirely clear. He seems to describe an element of orthostatic intolerance. However, his orthostatic vital signs are normal.    -  Obtain Myoview as noted.    -  Labs today: Basic metabolic panel, CBC. 3.  CAD: He is s/p PCI to LCx and RCA. EF low normal on echo. He is on ASA 81, Plavix 75, metoprolol, ACEI, and statin. I would continue Plavix long-term as long as he does not develop bleeding problems. Arrange  myoview as noted.  I have resumed Imdur at 15 mg QD today (after the patient left the office and confirmed his medications).  4.  PAD: Penetrating ulcer left CIA and  chronic SMA dissection. Last CTA abdomen did not show any changes in the appearance of the SMA. Patient has followup with vascular surgery later this year.  5.  Smoking: He is staying off cigarettes.  6.  Hyperlipidemia:  Records indicate he is unable to tolerate atorvastatin. However, he is on atorvastatin 80 and pravastatin without symptoms. LFTs had been elevated, but in 1/16, LFTs back to normal and in 10/15 LDL acceptable.    -  DC Pravastatin.    -  Continue Atorvastatin 80 mg QD.    -  Check Lipids and LFTs in 6 weeks.   7.  HTN:  Fair control.  Change medications as noted.  8.  AAA: Will be followed by [facility].   Current medicines are reviewed at length with the patient today.  The patient does not have concerns regarding medicines.  The following changes have been made:  As above.   Labs/ tests ordered today include:   Orders Placed This Encounter  Procedures  . Basic Metabolic Panel (BMET)  . CBC w/Diff  . Lipid Profile  . Hepatic function panel  . Basic metabolic panel  . Myocardial Perfusion Imaging  . EKG 12-Lead     Disposition:   FU with me in 4 weeks.   Signed, Versie Starks, MHS 10/22/2014 11:23 AM    [facility] [address], [place], [place]  [address] Phone: [phone]; Fax: [phone]

## 2014-10-22 NOTE — Patient Instructions (Signed)
Your physician has recommended you make the following change in your medication:  1. STOP PRAVASTAIN 2. STAY ON ATORVASTATIN  CALL WHEN YOU GET HOME TO LET us KNOW IF YOU ARE TAKING LISINOPRIL AND IMDUR ; [phone] SCOTT WEAVER, PAC  LAB WORK TODAY; BMET, CBC W/DIFF  YOU WILL NEED FASTING LIPID AND LIVER PANEL TO BE DONE IN 6 WEEKS  FOLLOW UP WITH SCOTT WEAVER, PAC 3-4 WEEKS SAME DAY DR. Aundra Dubin IS IN THE OFFICE

## 2014-10-24 ENCOUNTER — Telehealth: Payer: Self-pay | Admitting: Cardiology

## 2014-10-24 ENCOUNTER — Other Ambulatory Visit: Payer: Self-pay

## 2014-10-24 DIAGNOSIS — I10 Essential (primary) hypertension: Secondary | ICD-10-CM

## 2014-10-24 MED ORDER — LISINOPRIL 40 MG PO TABS: 40.0000 mg | ORAL_TABLET | Freq: Every day | ORAL | Status: DC

## 2014-10-24 NOTE — Telephone Encounter (Signed)
I rtnd son's call. He had questions about stress test instructions and medication ? about lisinopril. I stated that I had already discussed this with ESA who looks like was primary to contact. Son said ok as long as we had talked to someone. He thanked me for my help.

## 2014-10-24 NOTE — Telephone Encounter (Signed)
New Msg       Pt son calling, states he has questions about pt last office visit on 10/22/14.   Please return call at [phone].

## 2014-10-24 NOTE — Telephone Encounter (Signed)
s/w daughter ESA due to language barrier for pt. She has been notified about lab results. She asked if we could do referral to Endocrinology. I advised for her to PCP to discuss this, gave her Janett Billow Copland at [phone] to ask about referral.

## 2014-10-28 ENCOUNTER — Other Ambulatory Visit: Payer: Self-pay

## 2014-10-28 DIAGNOSIS — H903 Sensorineural hearing loss, bilateral: Secondary | ICD-10-CM | POA: Diagnosis not present

## 2014-10-28 MED ORDER — METOPROLOL TARTRATE 25 MG PO TABS: 12.5000 mg | ORAL_TABLET | Freq: Two times a day (BID) | ORAL | Status: DC

## 2014-11-04 ENCOUNTER — Other Ambulatory Visit: Payer: Self-pay | Admitting: Family Medicine

## 2014-11-10 ENCOUNTER — Encounter (HOSPITAL_COMMUNITY): Payer: Medicare Other

## 2014-11-17 ENCOUNTER — Ambulatory Visit (HOSPITAL_COMMUNITY): Payer: Medicare Other | Attending: Cardiovascular Disease | Admitting: Radiology

## 2014-11-17 DIAGNOSIS — R42 Dizziness and giddiness: Secondary | ICD-10-CM | POA: Insufficient documentation

## 2014-11-17 DIAGNOSIS — I2510 Atherosclerotic heart disease of native coronary artery without angina pectoris: Secondary | ICD-10-CM | POA: Diagnosis not present

## 2014-11-17 DIAGNOSIS — R079 Chest pain, unspecified: Secondary | ICD-10-CM

## 2014-11-17 DIAGNOSIS — E119 Type 2 diabetes mellitus without complications: Secondary | ICD-10-CM | POA: Diagnosis not present

## 2014-11-17 MED ORDER — TECHNETIUM TC 99M SESTAMIBI GENERIC - CARDIOLITE
30.0000 | Freq: Once | INTRAVENOUS | Status: AC | PRN
  Administered 2014-11-17: 30 via INTRAVENOUS

## 2014-11-17 MED ORDER — REGADENOSON 0.4 MG/5ML IV SOLN
0.4000 mg | Freq: Once | INTRAVENOUS | Status: AC
  Administered 2014-11-17: 0.4 mg via INTRAVENOUS

## 2014-11-17 MED ORDER — TECHNETIUM TC 99M SESTAMIBI GENERIC - CARDIOLITE
10.0000 | Freq: Once | INTRAVENOUS | Status: AC | PRN
  Administered 2014-11-17: 10 via INTRAVENOUS

## 2014-11-17 NOTE — Progress Notes (Signed)
[place] [facility] NUCLEAR [facility] [address] [place], [place] [address] [phone]    Cardiology Nuclear Med Tobey Schmelzle Pardini is a 64 y.o. male     MRN : [id number]     DOB: 11/26/1950  Procedure Date: 11/17/2014  Nuclear Med Background Indication for Stress Test:  Evaluation for Ischemia, and Stent Patency  History:  CAD Cardiac Risk Factors: Hypertension and NIDDM  Symptoms:  Chest Pain (last date of chest discomfort ws earlier today 3/10) and Dizziness   Nuclear Pre-Procedure Caffeine/Decaff Intake:  None>12 hrs NPO After: 7:00pm   Lungs:  clear O2 Sat: 97% on room air. IV 0.9% NS with Angio Cath:  22g  IV Site: R Hand x 1, tolerated well IV Started by:  Irven Baltimore, RN  Chest Size (in):  40 Cup Size: n/a  Height: 5\' 5"  (1.651 m)  Weight:  170 lb (77.111 kg)  BMI:  Body mass index is 28.29 kg/(m^2). Tech Comments:  Patient took Lopressor last night, and held Metformin this am. [place] Interpreter present. Irven Baltimore, RN.    Nuclear Med Study 1 or 2 day study: 1 day  Stress Test Type:  Carlton Adam  Reading MD: N/A  Order Authorizing Provider:  Loralie Champagne, MD  Resting Radionuclide: Technetium 60m Sestamibi  Resting Radionuclide Dose: 11.0 mCi   Stress Radionuclide:  Technetium 40m Sestamibi  Stress Radionuclide Dose: 33.0 mCi           Stress Protocol Rest HR: 58 Stress HR: 103  Rest BP: 146/79 Stress BP: 133/69  Exercise Time (min): n/a METS: n/a   Predicted Max HR: 156 bpm % Max HR: 66.03 bpm Rate Pressure Product: 14523   Dose of Adenosine (mg):  n/a Dose of Lexiscan: 0.4 mg  Dose of Atropine (mg): n/a Dose of Dobutamine: n/a mcg/kg/min (at max HR)  Stress Test Technologist: Glade Lloyd, BS-ES  Nuclear Technologist:  Earl Many, CNMT     Rest Procedure:  Myocardial perfusion imaging was performed at rest 45 minutes following the intravenous administration of Technetium 53m Sestamibi. Rest ECG: Sinus bradycardia, cannot R/O prior septal  MI, nonspecific ST changes.  Stress Procedure:  The patient received IV Lexiscan 0.4 mg over 15-seconds.  Technetium 69m Sestamibi injected at 30-seconds.  Quantitative spect images were obtained after a 45 minute delay.  During the infusion of Lexiscan, the patient complained of dizziness, nausea and stomach upset.  These symptoms began to resolve in recovery.  Stress ECG: No significant ST segment change suggestive of ischemia.  QPS Raw Data Images:  Acquisition technically good; normal left ventricular size. Stress Images:  There is decreased uptake in the inferior lateral wall. Rest Images:  There is decreased uptake in the inferior lateral wall. Subtraction (SDS):  No evidence of ischemia. Transient Ischemic Dilatation (Normal <1.22):  1.03 Lung/Heart Ratio (Normal <0.45):  0.32  Quantitative Gated Spect Images QGS EDV:  110 ml QGS ESV:  56 ml  Impression Exercise Capacity:  Lexiscan with no exercise. BP Response:  Normal blood pressure response. Clinical Symptoms:  No chest pain or dyspnea. ECG Impression:  No significant ST segment change suggestive of ischemia. Comparison with Prior Nuclear Study: No previous nuclear study performed  Overall Impression:  Low risk stress nuclear study with a small, moderate intensity, fixed inferior lateral defect consistent with thinning vs small prior infarct; no ischemia.  LV Ejection Fraction: 49%.  LV Wall Motion:  Mild global reduction in LV function.  Kirk Ruths

## 2014-11-18 ENCOUNTER — Telehealth: Payer: Self-pay | Admitting: *Deleted

## 2014-11-18 ENCOUNTER — Encounter: Payer: Self-pay | Admitting: Physician Assistant

## 2014-11-18 NOTE — Telephone Encounter (Signed)
son notified of lab results, he also verified appt with Brynda Rim. PA 2/24 8:50

## 2014-11-19 ENCOUNTER — Encounter: Payer: Self-pay | Admitting: Physician Assistant

## 2014-11-19 ENCOUNTER — Ambulatory Visit (INDEPENDENT_AMBULATORY_CARE_PROVIDER_SITE_OTHER): Payer: Medicare Other | Admitting: Physician Assistant

## 2014-11-19 ENCOUNTER — Telehealth: Payer: Self-pay | Admitting: Cardiology

## 2014-11-19 VITALS — BP 129/80 | HR 53 | Ht 65.0 in | Wt 172.0 lb

## 2014-11-19 DIAGNOSIS — R42 Dizziness and giddiness: Secondary | ICD-10-CM | POA: Diagnosis not present

## 2014-11-19 DIAGNOSIS — I10 Essential (primary) hypertension: Secondary | ICD-10-CM

## 2014-11-19 DIAGNOSIS — R079 Chest pain, unspecified: Secondary | ICD-10-CM | POA: Diagnosis not present

## 2014-11-19 DIAGNOSIS — E785 Hyperlipidemia, unspecified: Secondary | ICD-10-CM | POA: Diagnosis not present

## 2014-11-19 DIAGNOSIS — I2510 Atherosclerotic heart disease of native coronary artery without angina pectoris: Secondary | ICD-10-CM | POA: Diagnosis not present

## 2014-11-19 DIAGNOSIS — K551 Chronic vascular disorders of intestine: Secondary | ICD-10-CM

## 2014-11-19 DIAGNOSIS — I723 Aneurysm of iliac artery: Secondary | ICD-10-CM | POA: Diagnosis not present

## 2014-11-19 DIAGNOSIS — R0683 Snoring: Secondary | ICD-10-CM | POA: Diagnosis not present

## 2014-11-19 NOTE — Telephone Encounter (Signed)
Per Patient don't want to do the sleep study.   This was told to [facility] who is working with the PA.

## 2014-11-19 NOTE — Progress Notes (Signed)
Cardiology Office Note   Date:  11/19/2014   ID:  Barry [name], DOB 28-Jul-1951, MRN [id number]  PCP:  Lamar Blinks, MD  Cardiologist:  Dr. Loralie Champagne    Chief Complaint  Patient presents with  . Coronary Artery Disease     History of Present Illness: Barry [name] is a 64 y.o. [place] male with a history of CAD, chronic superior mesenteric artery dissection, prior stroke, DM 2, HTN, CKD, HL.  He originally presented in 02/2014 with non-STEMI. LHC demonstrated a ruptured plaque and thrombus in the proximal RCA which was treated with a DES. He was readmitted several weeks later with unstable angina and LHC demonstrated high-grade stenosis in the CFX which was treated with a DES.  I saw him 1/27 in follow-up. He complained of left-sided chest discomfort described as "hot." This had been coming and going for about a year. He denied exertional symptoms. He also complained of dizziness with standing as well as after exertion for which he takes nitroglycerin.  Medication list was incorrect and the patient was not clear what medications he was taking.  Orthostatic vital signs were normal. Lexiscan Myoview was obtained. This was low risk with inferior lateral defect consistent with thinning versus small prior infarct. There was no ischemia. EF was 49%.  Patient was on pravastatin and atorvastatin. Pravastatin was discontinued. Follow-up lipids and LFTs will need to be arranged.  He returns for FU.  He is interviewed today with the help of an interpreter.  He is a difficult historian.  He continues to have occ L sided chest pain.  He denies DOE.  He denies orthopnea, PND, edema.  Denies syncope.  He continues to have dizziness.  There are times that he describes vertiginous symptoms.  There are also times that he describes dizziness after exercise for which he take NTG.  He has apparently had these symptoms for several weeks.  He denies any symptoms reminiscent of his previous angina.  It is not clear to  me why he takes NTG for this.  As far as I can tell, he does not have chest pain with this.     Studies/Reports Reviewed Today:  Myoview 11/18/14 Exercise Capacity: Lexiscan with no exercise. BP Response: Normal blood pressure response. Clinical Symptoms: No chest pain or dyspnea. ECG Impression: No significant ST segment change suggestive of ischemia. Comparison with Prior Nuclear Study: No previous nuclear study performed  Overall Impression: Low risk stress nuclear study with a small, moderate intensity, fixed inferior lateral defect consistent with thinning vs small prior infarct; no ischemia. LV Ejection Fraction: 49%. LV Wall Motion: Mild global reduction in LV function.    Past Medical History: 1. HTN 2. Type II diabetes 3. CVA in 2011 4. AAA: 2.2 cm in 3/15.  5. PAD: Left CIA with penetrating ulcer. 6. Chronic superior mesenteric artery dissection, followed by Dr Trula Slade. 7. CKD 8. CAD: NSTEMI 6/15 with 95% proximal RCA stenosis treated with DES. Unstable angina later in 6/15, this time had DES to 90% mLCx. Echo (6/15) with EF 50-55%, moderate focal basal septal hypertrophy, normal RV size and systolic function.  9. Carotid dopplers (6/15) with mild stenosis.  10. Hyperlipidemia: Did not tolerate atorvastatin. Past Medical History  Diagnosis Date  . Hypertension   . Diabetes mellitus   . Stroke   . Iliac artery aneurysm, left   . Dissection of mesenteric artery     a. Mesenteric Artery Duplex (7/15):  pSMA chronic dissection with aneurysmal dilation of 1.29 cm (  [facility]);  b.  Chest CTA (02/26/14):  IMPRESSION:  1. No aortic dissection or other acute abnormality.  2. 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  3. Stable 17 mm ectasia of the left common iliac artery.  4. Atherosclerosis, including aortoiliac and coronary artery disease.   Marland Kitchen CAD (coronary artery disease)     a. LHC (02/26/14):  mLAD 20 (faint L>R collats), mCFX 50, pRCA 95 (plaque rupture) >>  PCI:  Xience DES to pRCA (severe tortuosity of R innominate artery >> needs L radial or FA in future);  b. LHC (03/10/14):  CFX 90, oOM 70, pRCA stent patent >> PCI:  Xience DES to mCFX   . Myocardial infarction   . Hx of cardiovascular stress test     Lexiscan Myoview (2/16):  Inferior lateral defect c/w thinning vs small prior infarct, no ischemia, EF 49%, Low Risk  . Hx of echocardiogram     a.  Echocardiogram (02/27/14):  Mod focal basal and mild concentric LVH, EF 50-55%, no RWMA, Gr 1 DD, mild TR, normal RVF  . Carotid stenosis     a. Carotid US (02/27/14):  Bilateral ICA 1-39%    Past Surgical History  Procedure Laterality Date  . Admission  09/26/2009    CVA.  [name].  . Cardiac catheterization    . Coronary angioplasty    . Left heart catheterization with coronary angiogram N/A 02/26/2014    Procedure: LEFT HEART CATHETERIZATION WITH CORONARY ANGIOGRAM;  Surgeon: Wellington Hampshire, MD;  Location: [facility];  Service: Cardiovascular;  Laterality: N/A;  . Left heart catheterization with coronary angiogram N/A 03/10/2014    Procedure: LEFT HEART CATHETERIZATION WITH CORONARY ANGIOGRAM;  Surgeon: Jettie Booze, MD;  Location: [facility];  Service: Cardiovascular;  Laterality: N/A;     Current Outpatient Prescriptions  Medication Sig Dispense Refill  . aspirin EC 81 MG EC tablet Take 1 tablet (81 mg total) by mouth daily.    Marland Kitchen atorvastatin (LIPITOR) 80 MG tablet Take 80 mg by mouth daily at 6 PM.     . Blood Glucose Monitoring Suppl (ONE TOUCH ULTRA SYSTEM KIT) W/DEVICE KIT 1 kit by Does not apply route once. One touch delica lancets, and meter Pt will test once daily. Dx 250.92 1 each 0  . clopidogrel (PLAVIX) 75 MG tablet Take 1 tablet (75 mg total) by mouth daily with breakfast. 30 tablet 11  . diphenhydrAMINE (BENADRYL) 25 MG tablet Take 50 mg by mouth daily as needed for allergies.    . fluorometholone (FML) 0.1 % ophthalmic suspension Place 1 drop into both eyes every  morning.     . gabapentin (NEURONTIN) 300 MG capsule TAKE ONE CAPSULE BY MOUTH THREE TIMES DAILY 90 capsule 4  . glucose blood test strip Test blood sugar daily. Dx code: 83.92. (Patient taking differently: 1 each by Other route daily. Test blood sugar daily. Dx code: 250.92.) 100 each 3  . isosorbide mononitrate (IMDUR) 30 MG 24 hr tablet Take 0.5 tablets (15 mg total) by mouth daily. 30 tablet 11  . lisinopril (PRINIVIL,ZESTRIL) 40 MG tablet Take 1 tablet (40 mg total) by mouth daily. 30 tablet 6  . metFORMIN (GLUCOPHAGE) 500 MG tablet Take 1 tablet (500 mg total) by mouth 2 (two) times daily with a meal. 180 tablet 3  . metoprolol tartrate (LOPRESSOR) 25 MG tablet Take 0.5 tablets (12.5 mg total) by mouth 2 (two) times daily. 30 tablet 6  . nitroGLYCERIN (NITROSTAT) 0.4 MG SL  tablet Place 1 tablet (0.4 mg total) under the tongue every 5 (five) minutes as needed for chest pain. 25 tablet 2  . ondansetron (ZOFRAN-ODT) 8 MG disintegrating tablet Take 1 tablet (8 mg total) by mouth every 8 (eight) hours as needed for nausea or vomiting. 20 tablet 0  . ONETOUCH DELICA LANCETS 38U MISC Test blood sugar daily. Dx code: E11.8 100 each 3  . PAZEO 0.7 % SOLN Apply 1 drop to eye 2 (two) times daily.     . polyethylene glycol powder (GLYCOLAX/MIRALAX) powder Take 17 g by mouth 2 (two) times daily as needed. 3350 g 1   No current facility-administered medications for this visit.    Allergies:   Review of patient's allergies indicates no known allergies.    Social History:  The patient  reports that he quit smoking about 5 years ago. He has never used smokeless tobacco. He reports that he does not drink alcohol or use illicit drugs.   Family History:  The patient's family history includes Diabetes in his father; Heart attack in his father; Hypertension in his father. There is no history of Stroke.    ROS:  Please see the history of present illness.    Review of Systems  Constitution: Positive for  chills, diaphoresis and malaise/fatigue. Negative for fever.  HENT: Positive for hearing loss.   Eyes: Positive for visual disturbance.  Cardiovascular: Positive for palpitations.  Respiratory: Positive for snoring. Negative for cough.        Loud snoring; ? Daytime sleepiness  Musculoskeletal: Positive for back pain and joint pain.       L knee pain; R hip pain  Gastrointestinal: Positive for abdominal pain and constipation.       Bloating from constipation  Neurological: Positive for dizziness and loss of balance.       Spinning sensation     PHYSICAL EXAM: VS:  BP 129/80 mmHg  Pulse 53  Ht 5' 5"  (1.651 m)  Wt 172 lb (78.019 kg)  BMI 28.62 kg/m2     Wt Readings from Last 3 Encounters:  11/19/14 172 lb (78.019 kg)  11/17/14 170 lb (77.111 kg)  10/22/14 172 lb (78.019 kg)     GEN: Well nourished, well developed, in no acute distress HEENT: normal Neck: no JVD, no masses Cardiac:  Normal S1/S2, RRR; no murmur, no rubs or gallops, no edema  Respiratory:  clear to auscultation bilaterally, no wheezing, rhonchi or rales. GI: soft, nontender, nondistended, + BS MS: no deformity or atrophy Skin: warm and dry  Neuro:  CNs II-XII intact, Strength and sensation are intact Psych: Normal affect   EKG:  EKG is ordered today.  It demonstrates:   Sinus brady, HR 53, normal axis, NSSTTW changes   Recent Labs: 03/07/2014: Pro B Natriuretic peptide (BNP) 420.7* 10/13/2014: ALT 49 10/22/2014: BUN 18; Creatinine 1.16; Hemoglobin 15.1; Platelets 244.0; Potassium 4.1; Sodium 137    Lipid Panel    Component Value Date/Time   CHOL 126 07/07/2014 1203   TRIG 118.0 07/07/2014 1203   HDL 26.00* 07/07/2014 1203   CHOLHDL 5 07/07/2014 1203   VLDL 23.6 07/07/2014 1203   LDLCALC 76 07/07/2014 1203   LDLDIRECT 87.7 04/28/2014 0838      ASSESSMENT AND PLAN:  1.  Chest pain:  Chest pain symptoms are atypical.  He had a recent Myoview that was low risk.  He does not need further cardiac  workup. 2.  Dizziness:  Etiology not entirely clear.  He takes  NTG with relief.  Some of his symptoms sound like vertigo.  Again he had a low risk Myoview recently.  I reviewed his symptoms with Dr. Loralie Champagne today.  We do not feel he needs further cardiac workup.  I have asked him to FU with his PCP to further evaluate his dizziness.   3.  CAD: He is s/p PCI to LCx and RCA. EF low normal on echo. He is on ASA 81, Plavix 75, metoprolol, ACEI, nitrates and statin. I would continue Plavix long-term as long as he does not develop bleeding problems. Recent Myoview was low risk.  Continue current Rx.   4.  PAD: Penetrating ulcer left CIA and chronic SMA dissection. Last CTA abdomen did not show any changes in the appearance of the SMA. Patient has followup with vascular surgery later this year.  5.  Smoking: He is staying off cigarettes.   6.  Hyperlipidemia:  Continue statin. Arrange FU Lipids and LFTs.    7.  HTN:  Controlled on current regimen.   8.  AAA: Will be followed by [facility].   9.  Snoring:  He has symptoms that are suspicious for OSA.  Arrange Split Night Sleep Study.  Current medicines are reviewed at length with the patient today.  The patient does not have concerns regarding medicines.  The following changes have been made:  As above.    Labs/ tests ordered today include:   Orders Placed This Encounter  Procedures  . Lipid panel  . Hepatic function panel  . EKG 12-Lead  . Split night study     Disposition:   FU with Dr. Loralie Champagne 3 mos.   Signed, Versie Starks, MHS 11/19/2014 9:30 AM    [facility] [place], [place], [place]  [address] Phone: [phone]; Fax: [phone]

## 2014-11-19 NOTE — Patient Instructions (Signed)
Your physician recommends that you continue on your current medications as directed. Please refer to the Current Medication list given to you today.  Your physician recommends that you return for lab work in: 4 weeks for LFT's and Lipids.  Your physician has recommended that you have a sleep study. This test records several body functions during sleep, including: brain activity, eye movement, oxygen and carbon dioxide blood levels, heart rate and rhythm, breathing rate and rhythm, the flow of air through your mouth and nose, snoring, body muscle movements, and chest and belly movement.  Your physician recommends that you schedule a follow-up appointment in: 3 months with Dr. Aundra Dubin.

## 2014-12-03 ENCOUNTER — Other Ambulatory Visit: Payer: Medicare Other

## 2014-12-08 ENCOUNTER — Encounter: Payer: Medicare Other | Attending: Endocrinology | Admitting: Nutrition

## 2014-12-08 ENCOUNTER — Encounter: Payer: Self-pay | Admitting: Endocrinology

## 2014-12-08 ENCOUNTER — Ambulatory Visit (INDEPENDENT_AMBULATORY_CARE_PROVIDER_SITE_OTHER): Payer: Medicare Other | Admitting: Endocrinology

## 2014-12-08 VITALS — BP 159/81 | HR 75 | Temp 97.8°F | Resp 14 | Ht 65.0 in | Wt 172.4 lb

## 2014-12-08 DIAGNOSIS — IMO0002 Reserved for concepts with insufficient information to code with codable children: Secondary | ICD-10-CM

## 2014-12-08 DIAGNOSIS — Z713 Dietary counseling and surveillance: Secondary | ICD-10-CM | POA: Insufficient documentation

## 2014-12-08 DIAGNOSIS — I10 Essential (primary) hypertension: Secondary | ICD-10-CM

## 2014-12-08 DIAGNOSIS — I2510 Atherosclerotic heart disease of native coronary artery without angina pectoris: Secondary | ICD-10-CM

## 2014-12-08 DIAGNOSIS — E782 Mixed hyperlipidemia: Secondary | ICD-10-CM

## 2014-12-08 DIAGNOSIS — E119 Type 2 diabetes mellitus without complications: Secondary | ICD-10-CM | POA: Diagnosis not present

## 2014-12-08 DIAGNOSIS — E1169 Type 2 diabetes mellitus with other specified complication: Secondary | ICD-10-CM | POA: Diagnosis not present

## 2014-12-08 DIAGNOSIS — E1165 Type 2 diabetes mellitus with hyperglycemia: Secondary | ICD-10-CM

## 2014-12-08 DIAGNOSIS — G629 Polyneuropathy, unspecified: Secondary | ICD-10-CM

## 2014-12-08 DIAGNOSIS — E1342 Other specified diabetes mellitus with diabetic polyneuropathy: Secondary | ICD-10-CM | POA: Diagnosis not present

## 2014-12-08 DIAGNOSIS — E1142 Type 2 diabetes mellitus with diabetic polyneuropathy: Secondary | ICD-10-CM

## 2014-12-08 LAB — GLUCOSE, POCT (MANUAL RESULT ENTRY): POC Glucose: 229 mg/dl — AB (ref 70–99)

## 2014-12-08 NOTE — Patient Instructions (Addendum)
Please check blood sugars at least half the time about 2 hours after any meal and 3 times per week on waking up. Please bring blood sugar monitor to each visit. Recommended blood sugar levels about 2 hours after meal is 140-180 and on waking up 90-130  Start LANTUS insulin: This insulin provides blood sugar control for up to 24 hours.   Start with 10 units once daily   You may inject in the stomach, thigh or arm as shown.  If the blood sugar is still over 200 after 5 days increase the dose to 14 units   Start VICTOZA injection as shown once daily at the same time of the day, may take this with the Lantus insulin.  This is NOT insulin but will keep the blood sugar from going up after meals and also keep you from getting excessively hungry  Dial the dose to 0.6 mg on the pen for the first week. You may experience some nausea in the first few days which usually goes away by the end of the week.   You will feel fullness of the stomach with starting the medication and should try to keep the portions at meals small. After 1 week increase the dose to 1.2mg  daily if no nausea present.   If any questions or concerns are present call the office or the [place] helpline at [phone]. Visit [email or web address].info/ for more useful information  Restart lisinopril tablets once a day for blood pressure

## 2014-12-08 NOTE — Progress Notes (Signed)
Patient ID: Barry [name], male   DOB: 12-28-1950, 64 y.o.   MRN: [id number]           Reason for Appointment: Consultation for Type 2 Diabetes  Referring physician: Janett Billow Copland  History of Present Illness:          Diagnosis: Type 2 diabetes mellitus, date of diagnosis: 2011       Past history: At the time of diagnosis he was having symptoms of dizziness and blurred vision Apparently he was treated with metformin for quite some time without any other medications Previous records of his treatment are not available but his A1c was significantly high at 9.5 in June 2015. Apparently has had inconsistent compliance with various issues of taking care of his diabetes including medication compliance and diet  Recent history:  He is now referred here for worsening of his diabetes control and A1c going up to 9% in 09/2014 This is despite his taking metformin twice a day. He now says that at least for the last 3 weeks he feels tightness in his upper abdomen and nausea and he stopped his metformin 3 weeks ago.  Does not have these symptoms now. Although he is checking his blood sugar he did not bring any records for review and appears that his blood sugars are well over 200 fasting.  He does not think blood sugars are higher with stopping metformin also.  Does not check readings after meals. He has not had any significant diabetes education.  He usually is eating a significant amount of rice with every meal Does only a modest amount of exercise       Oral hypoglycemic drugs the patient is taking are: None      Side effects from medications have been: Nausea, abdominal discomfort with metformin    Compliance with the medical regimen: Fair  Glucose monitoring:  done one time a day in am, results are about 260-270        Glucometer: One Touch.      Blood Glucose readings not available for review today   Self-care: The diet that the patient has been following is: None      Meals: 3 meals per day.  Breakfast is rice porridge with chicken, lunch and dinner is usually rice, stirfry vegetables and fish or chicken          Exercise: Walking upto 15 min on some days         Dietician visit, most recent: none.               Weight history: Previous range 155-175  Wt Readings from Last 3 Encounters:  12/08/14 172 lb 6.4 oz (78.2 kg)  11/19/14 172 lb (78.019 kg)  11/17/14 170 lb (77.111 kg)    Glycemic control:   Lab Results  Component Value Date   HGBA1C 9.0 10/13/2014   HGBA1C 7.1 07/07/2014   HGBA1C 9.5* 02/27/2014   Lab Results  Component Value Date   MICROALBUR 4.55* 08/02/2012   LDLCALC 76 07/07/2014   CREATININE 1.16 10/22/2014         Medication List       This list is accurate as of: 12/08/14 11:59 PM.  Always use your most recent med list.               amoxicillin 875 MG tablet  Commonly known as:  AMOXIL  Take 875 mg by mouth 2 (two) times daily.     aspirin 81 MG EC tablet  Take 1 tablet (81 mg total) by mouth daily.     atorvastatin 80 MG tablet  Commonly known as:  LIPITOR  Take 80 mg by mouth daily at 6 PM.     clopidogrel 75 MG tablet  Commonly known as:  PLAVIX  Take 1 tablet (75 mg total) by mouth daily with breakfast.     diphenhydrAMINE 25 MG tablet  Commonly known as:  BENADRYL  Take 50 mg by mouth daily as needed for allergies.     fluorometholone 0.1 % ophthalmic suspension  Commonly known as:  FML  Place 1 drop into both eyes every morning.     gabapentin 300 MG capsule  Commonly known as:  NEURONTIN  TAKE ONE CAPSULE BY MOUTH THREE TIMES DAILY     glucose blood test strip  Test blood sugar daily. Dx code: 250.92.     isosorbide mononitrate 30 MG 24 hr tablet  Commonly known as:  IMDUR  Take 0.5 tablets (15 mg total) by mouth daily.     lisinopril 40 MG tablet  Commonly known as:  PRINIVIL,ZESTRIL  Take 1 tablet (40 mg total) by mouth daily.     metFORMIN 500 MG tablet  Commonly known as:  GLUCOPHAGE  Take 1 tablet  (500 mg total) by mouth 2 (two) times daily with a meal.     metoprolol tartrate 25 MG tablet  Commonly known as:  LOPRESSOR  Take 0.5 tablets (12.5 mg total) by mouth 2 (two) times daily.     nitroGLYCERIN 0.4 MG SL tablet  Commonly known as:  NITROSTAT  Place 1 tablet (0.4 mg total) under the tongue every 5 (five) minutes as needed for chest pain.     ondansetron 8 MG disintegrating tablet  Commonly known as:  ZOFRAN-ODT  Take 1 tablet (8 mg total) by mouth every 8 (eight) hours as needed for nausea or vomiting.     ONE TOUCH ULTRA SYSTEM KIT W/DEVICE Kit  1 kit by Does not apply route once. One touch delica lancets, and meter Pt will test once daily. Dx 827.07     ONETOUCH DELICA LANCETS 86L Misc  Test blood sugar daily. Dx code: E11.8     PAZEO 0.7 % Soln  Generic drug:  Olopatadine HCl  Apply 1 drop to eye 2 (two) times daily.     polyethylene glycol powder powder  Commonly known as:  GLYCOLAX/MIRALAX  Take 17 g by mouth 2 (two) times daily as needed.        Allergies: No Known Allergies  Past Medical History  Diagnosis Date  . Hypertension   . Diabetes mellitus   . Stroke   . Iliac artery aneurysm, left   . Dissection of mesenteric artery     a. Mesenteric Artery Duplex (7/15):  pSMA chronic dissection with aneurysmal dilation of 1.29 cm (V[facility]);  b.  Chest CTA (02/26/14):  IMPRESSION:  1. No aortic dissection or other acute abnormality.  2. 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  3. Stable 17 mm ectasia of the left common iliac artery.  4. Atherosclerosis, including aortoiliac and coronary artery disease.   Marland Kitchen CAD (coronary artery disease)     a. LHC (02/26/14):  mLAD 20 (faint L>R collats), mCFX 50, pRCA 95 (plaque rupture) >> PCI:  Xience DES to pRCA (severe tortuosity of R innominate artery >> needs L radial or FA in future);  b. LHC (03/10/14):  CFX 90, oOM 70, pRCA stent patent >> PCI:  Xience DES  to mCFX   . Myocardial infarction   . Hx of  cardiovascular stress test     Lexiscan Myoview (2/16):  Inferior lateral defect c/w thinning vs small prior infarct, no ischemia, EF 49%, Low Risk  . Hx of echocardiogram     a.  Echocardiogram (02/27/14):  Mod focal basal and mild concentric LVH, EF 50-55%, no RWMA, Gr 1 DD, mild TR, normal RVF  . Carotid stenosis     a. Carotid US (02/27/14):  Bilateral ICA 1-39%    Past Surgical History  Procedure Laterality Date  . Admission  09/26/2009    [facility].  [name].  . Cardiac catheterization    . Coronary angioplasty    . Left heart catheterization with coronary angiogram N/A 02/26/2014    Procedure: LEFT HEART CATHETERIZATION WITH CORONARY ANGIOGRAM;  Surgeon: Wellington Hampshire, MD;  Location: [place] CATH LAB;  Service: Cardiovascular;  Laterality: N/A;  . Left heart catheterization with coronary angiogram N/A 03/10/2014    Procedure: LEFT HEART CATHETERIZATION WITH CORONARY ANGIOGRAM;  Surgeon: Jettie Booze, MD;  Location: [facility] CATH LAB;  Service: Cardiovascular;  Laterality: N/A;    Family History  Problem Relation Age of Onset  . Diabetes Father   . Hypertension Father   . Heart attack Father   . Stroke Neg Hx     Social History:  reports that he quit smoking about 5 years ago. He has never used smokeless tobacco. He reports that he does not drink alcohol or use illicit drugs.    Review of Systems       Vision is normal. Most recent eye exam was         Lipids: On Lipitor for hypercholesterolemia       Lab Results  Component Value Date   CHOL 126 07/07/2014   HDL 26.00* 07/07/2014   LDLCALC 76 07/07/2014   LDLDIRECT 87.7 04/28/2014   TRIG 118.0 07/07/2014   CHOLHDL 5 07/07/2014                  Skin: No rash or infections     Thyroid:  has fatigue.     The blood pressure has been treated with lisinopril.  He has not taken this for the last 3 weeks     No swelling of feet.      He may tend to get shortness of breath on exertion. No recent chest tightness or  palpitations     Bowel habits: Normal.      No joint  pains.  Some pain in the right buttock area which he calls hip pain at times        He has a history of Numbness in his feet.  Sometimes will get tingling in his leg and foot but this is usually on one side or the other. Does not think symptoms are significantly better with gabapentin     LABS:  Office Visit on 12/08/2014  Component Date Value Ref Range Status  . POC Glucose 12/08/2014 229* 70 - 99 mg/dl Final    Physical Examination:  BP 159/81 mmHg  Pulse 75  Temp(Src) 97.8 F (36.6 C)  Resp 14  Ht _0  (1.651 m)  Wt 172 lb 6.4 oz (78.2 kg)  BMI 28.69 kg/m2  SpO2 96%  GENERAL:         Patient is averagely built and nourished with  abdominal obesity HEENT:         Eye exam shows normal external appearance.  Fundus exam shows no retinopathy. Oral exam shows normal mucosa .  NECK:         General:  Neck exam shows no lymphadenopathy. Carotids are normal to palpation and no bruit heard.  Thyroid is not enlarged and no nodules felt.   LUNGS:         Chest is symmetrical. Lungs are clear to auscultation.Marland Kitchen   HEART:         Heart sounds:  S1 and S2 are normal. No murmurs or clicks heard., no S3 or S4.   ABDOMEN:   There is no distention present. Liver and spleen are not palpable. No other mass or tenderness present.  EXTREMITIES:     There is no edema. No skin lesions present.Marland Kitchen  NEUROLOGICAL:   Vibration sense is moderately reduced in toes. Ankle jerks are absent bilaterally.          Diabetic foot exam: Normal except Decreased monofilament sensation on the toes on the right side and also the left fifth toe   MUSCULOSKELETAL:       There is no enlargement or deformity of the joints. Spine is normal to inspection.Marland Kitchen   SKIN:       No rash or lesions of concern.        ASSESSMENT:  Diabetes type 2, uncontrolled    Although he has a BMI of about 29 he has abdominal obesity with other features of metabolic syndrome indicating  insulin resistance Currently his A1c is over 9% and glucose readings are at least over 200 including fasting He is also reluctant to try any oral medications; has difficulty affording brand name medications also He does need significant amount of diabetes education Does need to modify his meals with reducing carbohydrate intake since he is getting rice with every meal Also difficult to make him understand day-to-day self-care skills because of language and cultural barriers  Complications: Peripheral neuropathy, history of macrovascular disease including CVA and CAD  Hypertension: Currently uncontrolled  Hyperlipidemia: LDL is well controlled but still has low HDL  PLAN:   To get effective control of his diabetes he was recommended starting basal insulin along with Victoza. Discussed with the patient the nature of GLP-1 drugs, the actions on various organ systems, how they benefit blood glucose control, as well as the benefit of weight loss and  increase satiety . Explained possible side effects especially nausea and vomiting initially; discussed safety information in package insert.  Described the injection technique and dosage titration of Victoza  starting with 0.6 mg once a day at the same time for the first week and then increasing to 1.2 mg if no symptoms of nausea.  He was shown how to do the injection by the nurse educator also.  Educational brochure on Victoza given  Discussed actions of basal insulin using Lantus and timing of the injection, duration of action, adjustment based on fasting blood sugar trend.  We'll start with low doses at 10 units for now and also will instruct him on increasing the dose in about 5 days  We will not start metformin as he has GI intolerance with this  Discussed timing and frequency of glucose monitoring including postprandial and he will bring his monitor for download.  Restart lisinopril for uncontrolled hypertension  Recommend consultation with  dietitian.  Meanwhile recommend that he reduce portions of carbohydrates like rice and dry brown rice instead  Encouraged him to increase frequency and duration of walking as tolerated  Follow-up in 10 days  Patient Instructions  Please check blood sugars at least half the time about 2 hours after any meal and 3 times per week on waking up. Please bring blood sugar monitor to each visit. Recommended blood sugar levels about 2 hours after meal is 140-180 and on waking up 90-130  Start LANTUS insulin: This insulin provides blood sugar control for up to 24 hours.   Start with 10 units once daily   You may inject in the stomach, thigh or arm as shown.  If the blood sugar is still over 200 after 5 days increase the dose to 14 units   Start VICTOZA injection as shown once daily at the same time of the day, may take this with the Lantus insulin.  This is NOT insulin but will keep the blood sugar from going up after meals and also keep you from getting excessively hungry  Dial the dose to 0.6 mg on the pen for the first week. You may experience some nausea in the first few days which usually goes away by the end of the week.   You will feel fullness of the stomach with starting the medication and should try to keep the portions at meals small. After 1 week increase the dose to 1.61m daily if no nausea present.   If any questions or concerns are present call the office or the [facility] at [phone] Visit [email or web address] more useful information  Restart lisinopril tablets once a day for blood pressure  Counseling time over 50% of today's 60 minute visit  Laretta Pyatt 12/09/2014, 10:28 AM   Note: This office note was prepared with [place] agent Any transcriptional errors that result from this process are unintentional.

## 2014-12-10 ENCOUNTER — Encounter: Payer: Medicare Other | Admitting: Nutrition

## 2014-12-10 NOTE — Progress Notes (Signed)
I instructed Mr. Stradling on how to give Victoza and Lantus injections.  His interpreter was present, and his wife took notes on how to do each injection.   We reviewed how to increase the dose of the Victoza after 7 days, and if no nausea,from 0.6 to 1.2, and they both reported good understanding of this.   We also reviewed how to use the Lantus pen and how to draw up and inject 10u of lantus insulin.  They both reported good understanding of this. We also reviewed low blood sugars--symptoms and treatments, and he was given a sample of glucose tablets to use if these symtoms occur.  He was told to call the office if these symptoms occur.  He agreed to do this.   They had no final questions.

## 2014-12-11 ENCOUNTER — Telehealth: Payer: Self-pay | Admitting: Endocrinology

## 2014-12-11 MED ORDER — LIRAGLUTIDE 18 MG/3ML ~~LOC~~ SOPN: PEN_INJECTOR | SUBCUTANEOUS | Status: DC

## 2014-12-11 MED ORDER — INSULIN GLARGINE 100 UNIT/ML SOLOSTAR PEN: 10.0000 [IU] | PEN_INJECTOR | Freq: Every day | SUBCUTANEOUS | Status: DC

## 2014-12-11 NOTE — Telephone Encounter (Signed)
Patient son stated that he called pharmacy they haven't received his medication for his insulin injections, please advise  Send to [place]

## 2014-12-11 NOTE — Telephone Encounter (Signed)
rx sent into pharmacy per notes from last OV.

## 2014-12-17 ENCOUNTER — Telehealth: Payer: Self-pay | Admitting: Endocrinology

## 2014-12-17 ENCOUNTER — Other Ambulatory Visit: Payer: Self-pay | Admitting: *Deleted

## 2014-12-17 ENCOUNTER — Ambulatory Visit: Payer: Medicare Other | Admitting: Endocrinology

## 2014-12-17 MED ORDER — INSULIN PEN NEEDLE 32G X 5 MM MISC: Status: DC

## 2014-12-17 NOTE — Telephone Encounter (Signed)
Son of this pt needs you to call him asap  [phone] son name is (Pros)

## 2014-12-17 NOTE — Telephone Encounter (Signed)
Patient no showed today's appt. Please advise on how to follow up. °A. No follow up necessary. °B. Follow up urgent. Contact patient immediately. °C. Follow up necessary. Contact patient and schedule visit in ___ days. °D. Follow up advised. Contact patient and schedule visit in ____weeks. ° °

## 2014-12-17 NOTE — Telephone Encounter (Signed)
Please schedule for follow-up as soon as possible

## 2014-12-18 ENCOUNTER — Other Ambulatory Visit (INDEPENDENT_AMBULATORY_CARE_PROVIDER_SITE_OTHER): Payer: Medicare Other | Admitting: *Deleted

## 2014-12-18 DIAGNOSIS — E785 Hyperlipidemia, unspecified: Secondary | ICD-10-CM

## 2014-12-18 DIAGNOSIS — I2510 Atherosclerotic heart disease of native coronary artery without angina pectoris: Secondary | ICD-10-CM

## 2014-12-18 LAB — HEPATIC FUNCTION PANEL
ALT: 56 U/L — ABNORMAL HIGH (ref 0–53)
AST: 41 U/L — ABNORMAL HIGH (ref 0–37)
Albumin: 4.2 g/dL (ref 3.5–5.2)
Alkaline Phosphatase: 75 U/L (ref 39–117)
Bilirubin, Direct: 0.1 mg/dL (ref 0.0–0.3)
Total Bilirubin: 0.8 mg/dL (ref 0.2–1.2)
Total Protein: 7.4 g/dL (ref 6.0–8.3)

## 2014-12-18 LAB — LIPID PANEL
Cholesterol: 215 mg/dL — ABNORMAL HIGH (ref 0–200)
HDL: 34.9 mg/dL — ABNORMAL LOW (ref >39.00)
LDL Cholesterol: 143 mg/dL — ABNORMAL HIGH (ref 0–99)
NonHDL: 180.1
Total CHOL/HDL Ratio: 6
Triglycerides: 186 mg/dL — ABNORMAL HIGH (ref 0.0–149.0)
VLDL: 37.2 mg/dL (ref 0.0–40.0)

## 2014-12-19 ENCOUNTER — Telehealth: Payer: Self-pay | Admitting: *Deleted

## 2014-12-19 DIAGNOSIS — I2510 Atherosclerotic heart disease of native coronary artery without angina pectoris: Secondary | ICD-10-CM

## 2014-12-19 DIAGNOSIS — E785 Hyperlipidemia, unspecified: Secondary | ICD-10-CM

## 2014-12-19 NOTE — Telephone Encounter (Signed)
s/w pt's daughter Esa due to language barrier. Esa states she will verify with her father (pt) if he is taking the lipitor 80, and we will discuss what plan of care we will need to follow .

## 2014-12-24 MED ORDER — ROSUVASTATIN CALCIUM 40 MG PO TABS: 40.0000 mg | ORAL_TABLET | Freq: Every day | ORAL | Status: DC

## 2014-12-24 NOTE — Telephone Encounter (Signed)
Esa left for work and will call back per family member.

## 2014-12-24 NOTE — Telephone Encounter (Signed)
[phone] Esa pt's daughter rtn call re plan of care

## 2014-12-24 NOTE — Telephone Encounter (Signed)
Esa called to confirm pt is taking Lipitor 80mg   Horton Chin RN

## 2014-12-26 ENCOUNTER — Telehealth: Payer: Self-pay | Admitting: Cardiology

## 2014-12-26 NOTE — Telephone Encounter (Signed)
LMTCB

## 2014-12-26 NOTE — Telephone Encounter (Signed)
New message     Son just found out that his dad stopped taking all of his medicatins about 2-3 weeks ago.  His dad said they made him constipated and he takes too much medication.  Son want dad to at least take his heart medications.  Please call and let him know what he is supposed to be taking.

## 2014-12-26 NOTE — Telephone Encounter (Signed)
tried to call pt's daughter back to let he know that I s/w her broth Pro's already in regards to pt medications.

## 2014-12-26 NOTE — Telephone Encounter (Signed)
[name] Pros states that his father has not taken any medications recently.   He states pt feels better when he is not taking medications. Pt denies any chest pain, he does not know his heart rate or BP.   We reviewed all his cardiac medications and I advised pt's son that it is important for pt to take them. [name] is going to encourage pt to take cardiac medications. Pt did not need refills for his cardiac medications.   I suggested that pt schedule an appt for office visit to review his medications and discuss side effects.  [name] unable to come for a couple of weeks and  requested appt for 01/22/15, this has been done.

## 2015-01-05 ENCOUNTER — Telehealth: Payer: Self-pay | Admitting: Endocrinology

## 2015-01-05 ENCOUNTER — Ambulatory Visit: Payer: Medicare Other | Admitting: Endocrinology

## 2015-01-05 NOTE — Telephone Encounter (Signed)
Patient no showed today's appt. Please advise on how to follow up. °A. No follow up necessary. °B. Follow up urgent. Contact patient immediately. °C. Follow up necessary. Contact patient and schedule visit in ___ days. °D. Follow up advised. Contact patient and schedule visit in ____weeks. ° °

## 2015-01-06 ENCOUNTER — Telehealth: Payer: Self-pay

## 2015-01-06 NOTE — Telephone Encounter (Signed)
There is an interpreter needed to reschedule 1 last time since he no showed 2x

## 2015-01-06 NOTE — Telephone Encounter (Signed)
Letter that rx for lantus is not covered. Pt does not have an appt.  Should a PA be started for this?

## 2015-01-06 NOTE — Telephone Encounter (Signed)
Need to see him first. There is an interpreter needed to reschedule 1 last time since he no showed 2x

## 2015-01-12 ENCOUNTER — Emergency Department (HOSPITAL_COMMUNITY)
Admission: EM | Admit: 2015-01-12 | Discharge: 2015-01-12 | Disposition: A | Discharge status: Home / Self Care | Payer: Medicare Other | Attending: Emergency Medicine | Admitting: Emergency Medicine

## 2015-01-12 ENCOUNTER — Encounter (HOSPITAL_COMMUNITY): Payer: Self-pay | Admitting: General Practice

## 2015-01-12 ENCOUNTER — Emergency Department (HOSPITAL_COMMUNITY): Payer: Medicare Other

## 2015-01-12 DIAGNOSIS — Z9889 Other specified postprocedural states: Secondary | ICD-10-CM | POA: Diagnosis not present

## 2015-01-12 DIAGNOSIS — I10 Essential (primary) hypertension: Secondary | ICD-10-CM | POA: Insufficient documentation

## 2015-01-12 DIAGNOSIS — I2510 Atherosclerotic heart disease of native coronary artery without angina pectoris: Secondary | ICD-10-CM | POA: Insufficient documentation

## 2015-01-12 DIAGNOSIS — R531 Weakness: Secondary | ICD-10-CM | POA: Insufficient documentation

## 2015-01-12 DIAGNOSIS — R404 Transient alteration of awareness: Secondary | ICD-10-CM | POA: Diagnosis not present

## 2015-01-12 DIAGNOSIS — Z87891 Personal history of nicotine dependence: Secondary | ICD-10-CM | POA: Insufficient documentation

## 2015-01-12 DIAGNOSIS — E119 Type 2 diabetes mellitus without complications: Secondary | ICD-10-CM | POA: Diagnosis not present

## 2015-01-12 DIAGNOSIS — Z7901 Long term (current) use of anticoagulants: Secondary | ICD-10-CM | POA: Diagnosis not present

## 2015-01-12 DIAGNOSIS — Z7982 Long term (current) use of aspirin: Secondary | ICD-10-CM | POA: Insufficient documentation

## 2015-01-12 DIAGNOSIS — R200 Anesthesia of skin: Secondary | ICD-10-CM | POA: Diagnosis not present

## 2015-01-12 DIAGNOSIS — Z8673 Personal history of transient ischemic attack (TIA), and cerebral infarction without residual deficits: Secondary | ICD-10-CM | POA: Diagnosis not present

## 2015-01-12 DIAGNOSIS — I252 Old myocardial infarction: Secondary | ICD-10-CM | POA: Diagnosis not present

## 2015-01-12 DIAGNOSIS — Z794 Long term (current) use of insulin: Secondary | ICD-10-CM | POA: Diagnosis not present

## 2015-01-12 DIAGNOSIS — M6281 Muscle weakness (generalized): Secondary | ICD-10-CM | POA: Diagnosis not present

## 2015-01-12 DIAGNOSIS — Z79899 Other long term (current) drug therapy: Secondary | ICD-10-CM | POA: Insufficient documentation

## 2015-01-12 LAB — I-STAT CHEM 8, ED
BUN: 14 mg/dL (ref 6–23)
Calcium, Ion: 1.27 mmol/L (ref 1.13–1.30)
Chloride: 99 mmol/L (ref 96–112)
Creatinine, Ser: 1.1 mg/dL (ref 0.50–1.35)
Glucose, Bld: 197 mg/dL — ABNORMAL HIGH (ref 70–99)
HCT: 51 % (ref 39.0–52.0)
Hemoglobin: 17.3 g/dL — ABNORMAL HIGH (ref 13.0–17.0)
Potassium: 4.4 mmol/L (ref 3.5–5.1)
Sodium: 139 mmol/L (ref 135–145)
TCO2: 25 mmol/L (ref 0–100)

## 2015-01-12 LAB — DIFFERENTIAL
Basophils Absolute: 0 10*3/uL (ref 0.0–0.1)
Basophils Relative: 0 % (ref 0–1)
Eosinophils Absolute: 0.1 10*3/uL (ref 0.0–0.7)
Eosinophils Relative: 2 % (ref 0–5)
Lymphocytes Relative: 24 % (ref 12–46)
Lymphs Abs: 1.5 10*3/uL (ref 0.7–4.0)
Monocytes Absolute: 0.3 10*3/uL (ref 0.1–1.0)
Monocytes Relative: 5 % (ref 3–12)
Neutro Abs: 4.3 10*3/uL (ref 1.7–7.7)
Neutrophils Relative %: 69 % (ref 43–77)

## 2015-01-12 LAB — URINALYSIS, ROUTINE W REFLEX MICROSCOPIC
Bilirubin Urine: NEGATIVE
Glucose, UA: 100 mg/dL — AB
Hgb urine dipstick: NEGATIVE
Ketones, ur: NEGATIVE mg/dL
Leukocytes, UA: NEGATIVE
Nitrite: NEGATIVE
Protein, ur: NEGATIVE mg/dL
Specific Gravity, Urine: 1.008 (ref 1.005–1.030)
Urobilinogen, UA: 0.2 mg/dL (ref 0.0–1.0)
pH: 7 (ref 5.0–8.0)

## 2015-01-12 LAB — COMPREHENSIVE METABOLIC PANEL
ALT: 45 U/L (ref 0–53)
AST: 41 U/L — ABNORMAL HIGH (ref 0–37)
Albumin: 4 g/dL (ref 3.5–5.2)
Alkaline Phosphatase: 84 U/L (ref 39–117)
Anion gap: 9 (ref 5–15)
BUN: 12 mg/dL (ref 6–23)
CO2: 27 mmol/L (ref 19–32)
Calcium: 9.8 mg/dL (ref 8.4–10.5)
Chloride: 102 mmol/L (ref 96–112)
Creatinine, Ser: 1.18 mg/dL (ref 0.50–1.35)
GFR calc Af Amer: 74 mL/min — ABNORMAL LOW (ref >90)
GFR calc non Af Amer: 63 mL/min — ABNORMAL LOW (ref >90)
Glucose, Bld: 189 mg/dL — ABNORMAL HIGH (ref 70–99)
Potassium: 4.2 mmol/L (ref 3.5–5.1)
Sodium: 138 mmol/L (ref 135–145)
Total Bilirubin: 0.6 mg/dL (ref 0.3–1.2)
Total Protein: 7.2 g/dL (ref 6.0–8.3)

## 2015-01-12 LAB — CBG MONITORING, ED: Glucose-Capillary: 182 mg/dL — ABNORMAL HIGH (ref 70–99)

## 2015-01-12 LAB — CBC
HCT: 48.5 % (ref 39.0–52.0)
Hemoglobin: 15.6 g/dL (ref 13.0–17.0)
MCH: 28.3 pg (ref 26.0–34.0)
MCHC: 32.2 g/dL (ref 30.0–36.0)
MCV: 88 fL (ref 78.0–100.0)
Platelets: 181 10*3/uL (ref 150–400)
RBC: 5.51 MIL/uL (ref 4.22–5.81)
RDW: 12.7 % (ref 11.5–15.5)
WBC: 6.2 10*3/uL (ref 4.0–10.5)

## 2015-01-12 LAB — RAPID URINE DRUG SCREEN, HOSP PERFORMED
Amphetamines: NOT DETECTED
Barbiturates: NOT DETECTED
Benzodiazepines: NOT DETECTED
Cocaine: NOT DETECTED
Opiates: NOT DETECTED
Tetrahydrocannabinol: NOT DETECTED

## 2015-01-12 LAB — APTT: aPTT: 30 seconds (ref 24–37)

## 2015-01-12 LAB — ETHANOL: Alcohol, Ethyl (B): <5 mg/dL (ref 0–9)

## 2015-01-12 LAB — PROTIME-INR
INR: 0.97 (ref 0.00–1.49)
Prothrombin Time: 12.9 seconds (ref 11.6–15.2)

## 2015-01-12 LAB — I-STAT TROPONIN, ED: Troponin i, poc: 0 ng/mL (ref 0.00–0.08)

## 2015-01-12 NOTE — ED Provider Notes (Signed)
CSN: [id number]     Arrival date & time 01/12/15  1014 History   First MD Initiated Contact with Patient 01/12/15 1015     Chief Complaint  Patient presents with  . Weakness     (Consider location/radiation/quality/duration/timing/severity/associated sxs/prior Treatment) The history is provided by the patient. A language interpreter was used.  Barry [name] is a 64 y.o. male hx of HTN, DM, iliac artery aneurysm, previous stroke on plavix here with left-sided weakness. Left-sided weakness for the last 3 days. Some numbness and tingling. Patient also has been steady. States that he has trouble patient up things on the left hand. Denies chest pain, abdominal pain.    Past Medical History  Diagnosis Date  . Hypertension   . Diabetes mellitus   . Stroke   . Iliac artery aneurysm, left   . Dissection of mesenteric artery     a. Mesenteric Artery Duplex (7/15):  pSMA chronic dissection with aneurysmal dilation of 1.29 cm ([facility]);  b.  Chest CTA (02/26/14):  IMPRESSION:  1. No aortic dissection or other acute abnormality.  2. 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  3. Stable 17 mm ectasia of the left common iliac artery.  4. Atherosclerosis, including aortoiliac and coronary artery disease.   Marland Kitchen CAD (coronary artery disease)     a. LHC (02/26/14):  mLAD 20 (faint L>R collats), mCFX 50, pRCA 95 (plaque rupture) >> PCI:  Xience DES to pRCA (severe tortuosity of R innominate artery >> needs L radial or FA in future);  b. LHC (03/10/14):  CFX 90, oOM 70, pRCA stent patent >> PCI:  Xience DES to mCFX   . Myocardial infarction   . Hx of cardiovascular stress test     Lexiscan Myoview (2/16):  Inferior lateral defect c/w thinning vs small prior infarct, no ischemia, EF 49%, Low Risk  . Hx of echocardiogram     a.  Echocardiogram (02/27/14):  Mod focal basal and mild concentric LVH, EF 50-55%, no RWMA, Gr 1 DD, mild TR, normal RVF  . Carotid stenosis     a. Carotid US (02/27/14):  Bilateral ICA 1-39%    Past Surgical History  Procedure Laterality Date  . Admission  09/26/2009    CVA.  Barry [name].  . Cardiac catheterization    . Coronary angioplasty    . Left heart catheterization with coronary angiogram N/A 02/26/2014    Procedure: LEFT HEART CATHETERIZATION WITH CORONARY ANGIOGRAM;  Surgeon: Wellington Hampshire, MD;  Location: [place] CATH LAB;  Service: Cardiovascular;  Laterality: N/A;  . Left heart catheterization with coronary angiogram N/A 03/10/2014    Procedure: LEFT HEART CATHETERIZATION WITH CORONARY ANGIOGRAM;  Surgeon: Jettie Booze, MD;  Location: [facility] CATH LAB;  Service: Cardiovascular;  Laterality: N/A;   Family History  Problem Relation Age of Onset  . Diabetes Father   . Hypertension Father   . Heart attack Father   . Stroke Neg Hx    History  Substance Use Topics  . Smoking status: Former Smoker    Quit date: 06/09/2009  . Smokeless tobacco: Never Used  . Alcohol Use: No    Review of Systems  Neurological: Positive for weakness.  All other systems reviewed and are negative.     Allergies  Review of patient's allergies indicates no known allergies.  Home Medications   Prior to Admission medications   Medication Sig Start Date End Date Taking? Authorizing Provider  aspirin EC 81 MG EC tablet Take 1 tablet (81 mg  total) by mouth daily. 02/28/14  Yes Luke K Kilroy, PA-C  Blood Glucose Monitoring Suppl (ONE TOUCH ULTRA SYSTEM KIT) W/DEVICE KIT 1 kit by Does not apply route once. One touch delica lancets, and meter Pt will test once daily. Dx 250.92 04/25/14  Yes Chelle Janalee Dane, PA-C  clopidogrel (PLAVIX) 75 MG tablet Take 1 tablet (75 mg total) by mouth daily with breakfast. 02/28/14  Yes Erlene Quan, PA-C  diphenhydrAMINE (BENADRYL) 25 MG tablet Take 50 mg by mouth daily as needed for allergies.   Yes Historical Provider, MD  gabapentin (NEURONTIN) 300 MG capsule TAKE ONE CAPSULE BY MOUTH THREE TIMES DAILY 11/05/14  Yes Darlyne Russian, MD  glucose blood test strip  Test blood sugar daily. Dx code: 60.92. Patient taking differently: 1 each by Other route daily. Test blood sugar daily. Dx code: 68.92. 04/24/14  Yes Chelle S Jeffery, PA-C  Insulin Glargine (LANTUS SOLOSTAR) 100 UNIT/ML Solostar Pen Inject 10 Units into the skin daily. 12/11/14  Yes Elayne Snare, MD  Insulin Pen Needle (NOVOTWIST) 32G X 5 MM MISC Use 2 per day to inject lantus and victoza 12/17/14  Yes Elayne Snare, MD  Liraglutide 18 MG/3ML SOPN Inject 0.43m daily for 1 week & then increase to 1.258mdaily. Patient taking differently: Inject 1.2 mg as directed daily.  12/11/14  Yes AjElayne SnareMD  lisinopril (PRINIVIL,ZESTRIL) 40 MG tablet Take 1 tablet (40 mg total) by mouth daily. 10/24/14  Yes DaLarey DresserMD  metFORMIN (GLUCOPHAGE) 500 MG tablet Take 1 tablet (500 mg total) by mouth 2 (two) times daily with a meal. 03/13/14  Yes KaEileen StanfordPA-C  metoprolol tartrate (LOPRESSOR) 25 MG tablet Take 0.5 tablets (12.5 mg total) by mouth 2 (two) times daily. 10/28/14  Yes DaLarey DresserMD  nitroGLYCERIN (NITROSTAT) 0.4 MG SL tablet Place 1 tablet (0.4 mg total) under the tongue every 5 (five) minutes as needed for chest pain. 02/28/14  Yes Luke K Kilroy, PA-C  ondansetron (ZOFRAN-ODT) 8 MG disintegrating tablet Take 1 tablet (8 mg total) by mouth every 8 (eight) hours as needed for nausea or vomiting. 07/07/14  Yes Sheryl L Precious HawsDO  ONETOUCH DELICA LANCETS 3302VISC Test blood sugar daily. Dx code: E11.8 10/23/14  Yes JeGay Filleropland, MD  polyethylene glycol powder (GLYCOLAX/MIRALAX) powder Take 17 g by mouth 2 (two) times daily as needed. 07/07/14  Yes Sheryl L Precious HawsDO  rosuvastatin (CRESTOR) 40 MG tablet Take 1 tablet (40 mg total) by mouth daily. 12/24/14  Yes ScLiliane ShiPA-C  isosorbide mononitrate (IMDUR) 30 MG 24 hr tablet Take 0.5 tablets (15 mg total) by mouth daily. Patient not taking: Reported on 01/12/2015 10/22/14   ScLiliane ShiPA-C   Pulse 89  Temp(Src) 98.1 F (36.7  C)  Resp 13  Ht 5' 8.9" (1.75 m)  SpO2 99% Physical Exam  Constitutional:  Chronically ill, NAD   HENT:  Head: Normocephalic.  Mouth/Throat: Oropharynx is clear and moist.  Eyes: Conjunctivae are normal. Pupils are equal, round, and reactive to light.  Neck: Normal range of motion. Neck supple.  Cardiovascular: Normal rate, regular rhythm and normal heart sounds.   Pulmonary/Chest: Effort normal and breath sounds normal. No respiratory distress. He has no wheezes. He has no rales.  Abdominal: Soft. Bowel sounds are normal. He exhibits no distension. There is no tenderness. There is no rebound.  Musculoskeletal: Normal range of motion.  Neurological:  Alert, no facial droop. L grip  strength 4/5, L leg flexion, 4/5. 5/5 on R side. Sensation intact   Skin: Skin is warm and dry.  Psychiatric: He has a normal mood and affect. His behavior is normal. Judgment and thought content normal.  Nursing note and vitals reviewed.   ED Course  Procedures (including critical care time) Labs Review Labs Reviewed  COMPREHENSIVE METABOLIC PANEL - Abnormal; Notable for the following:    Glucose, Bld 189 (*)    AST 41 (*)    GFR calc non Af Amer 63 (*)    GFR calc Af Amer 74 (*)    All other components within normal limits  URINALYSIS, ROUTINE W REFLEX MICROSCOPIC - Abnormal; Notable for the following:    Glucose, UA 100 (*)    All other components within normal limits  I-STAT CHEM 8, ED - Abnormal; Notable for the following:    Glucose, Bld 197 (*)    Hemoglobin 17.3 (*)    All other components within normal limits  CBG MONITORING, ED - Abnormal; Notable for the following:    Glucose-Capillary 182 (*)    All other components within normal limits  ETHANOL  PROTIME-INR  APTT  CBC  DIFFERENTIAL  URINE RAPID DRUG SCREEN (HOSP PERFORMED)  I-STAT TROPOININ, ED  I-STAT TROPOININ, ED    Imaging Review Ct Head Wo Contrast  01/12/2015   CLINICAL DATA:  Generalized weakness  EXAM: CT HEAD  WITHOUT CONTRAST  TECHNIQUE: Contiguous axial images were obtained from the base of the skull through the vertex without intravenous contrast.  COMPARISON:  02/26/2014  FINDINGS: No skull fracture is noted. Paranasal sinuses and mastoid air cells are unremarkable. No intracranial hemorrhage, mass effect or midline shift. No acute infarction. No mass lesion is noted on this unenhanced scan. The gray and white-matter differentiation is preserved.  IMPRESSION: No acute intracranial abnormality.   Electronically Signed   By: Lahoma Crocker M.D.   On: 01/12/2015 11:02   Mr Brain Wo Contrast  01/12/2015   CLINICAL DATA:  64 year old male with left-sided weakness with tingling and numbness over the past 3 days. Initial encounter.  EXAM: MRI HEAD WITHOUT CONTRAST  TECHNIQUE: Multiplanar, multiecho pulse sequences of the brain and surrounding structures were obtained without intravenous contrast.  COMPARISON:  01/12/2015 CT.  No comparison MR.  FINDINGS: No acute infarct.  Mild small vessel disease type changes. Remote small right caudate head infarct.  No intracranial hemorrhage.  No hydrocephalus.  No intracranial mass lesion noted on this unenhanced exam.  Major intracranial vascular structures are patent.  Cervical medullary junction, pituitary region, pineal region and orbital structures unremarkable.  Minimal to mild mucosal thickening paranasal sinuses most notable ethmoid sinus air cells.  IMPRESSION: No acute infarct.  Mild small vessel disease type changes.  Remote tiny right caudate head infarct.  Minimal to mild mucosal thickening paranasal sinuses most notable ethmoid sinus air cells.   Electronically Signed   By: Genia Del M.D.   On: 01/12/2015 12:11     EKG Interpretation   Date/Time:  Monday January 12 2015 10:39:55 EDT Ventricular Rate:  82 PR Interval:  167 QRS Duration: 116 QT Interval:  415 QTC Calculation: 485 R Axis:   96 Text Interpretation:  Sinus rhythm Nonspecific intraventricular  conduction  delay Inferior infarct, age indeterminate Baseline wander in lead(s) III  V6 No significant change since last tracing Confirmed by Flynn Lininger  MD, Fortunata Betty  ([id number]) on 01/12/2015 11:37:30 AM      MDM   Final diagnoses:  None    Birney Lennox is a 64 y.o. male here with L sided weakness, likely new stroke. On plavix already. Will get stroke workup.   12:17 PM MRi and CT showed no acute infarcts. Labs unremarkable. I think L sided weakness likely chronic. Passed swallow eval. Will dc home.     Wandra Arthurs, MD 01/12/15 [phone]

## 2015-01-12 NOTE — ED Notes (Signed)
Pt in MRI.

## 2015-01-12 NOTE — ED Notes (Addendum)
Pt passed RN stroke swallow screen.

## 2015-01-12 NOTE — ED Notes (Signed)
Pt brought to the ED via [facility] with a general complaint of generalized weakness. Pt is complaining of left sided weakness that started 3 days ago. Pt reports feeling numbness or tingling. Pt denies any chest pain or abdominal pain. Pt denies any N/V/D. EMS V/S CBG 208, HR 83, B/P182/112, SPO2 99%. Pt speaks [place], Nutritional therapist services used.

## 2015-01-12 NOTE — Discharge Instructions (Signed)
Continue to take your current medicines.   Follow up with your doctor.   Return to ER if you have worse weakness, fever, chest pain, vomiting.

## 2015-01-21 NOTE — Progress Notes (Signed)
Cardiology Office Note   Date:  01/21/2015   ID:  Barry [name], DOB 05/24/1951, MRN [id number]  PCP:  Lamar Blinks, MD  Cardiologist:  Dr. Loralie Champagne    Chief Complaint  Patient presents with  . Coronary Artery Disease     History of Present Illness: Barry [name] is a 64 y.o. [place] male with a history of CAD, chronic superior mesenteric artery dissection, prior stroke, DM 2, HTN, CKD, HL.  He originally presented in 02/2014 with non-STEMI. LHC demonstrated a ruptured plaque and thrombus in the proximal RCA which was treated with a DES. He was readmitted several weeks later with unstable angina and LHC demonstrated high-grade stenosis in the CFX which was treated with a DES.  I saw him 09/2014 and he complained of left-sided chest discomfort described as "hot." This had been coming and going for about a year. He denied exertional symptoms. He also complained of dizziness with standing as well as after exertion for which he took nitroglycerin.  Orthostatic vital signs were normal. Lexiscan Myoview was low risk with inferior lateral defect consistent with thinning versus small prior infarct. There was no ischemia. EF was 49%.    He called in recently and noted he feels better if he does not take any medications. He was set up for FU today. He has since been seen in the ED with weakness.  MRI and Head CT were unremarkable.    Studies/Reports Reviewed Today:  Myoview 11/18/14 Exercise Capacity: Lexiscan with no exercise. BP Response: Normal blood pressure response. Clinical Symptoms: No chest pain or dyspnea. ECG Impression: No significant ST segment change suggestive of ischemia. Comparison with Prior Nuclear Study: No previous nuclear study performed  Overall Impression: Low risk stress nuclear study with a small, moderate intensity, fixed inferior lateral defect consistent with thinning vs small prior infarct; no ischemia. LV Ejection Fraction: 49%. LV Wall Motion: Mild global  reduction in LV function.    Past Medical History: 1. HTN 2. Type II diabetes 3. CVA in 2011 4. AAA: 2.2 cm in 3/15.  5. PAD: Left CIA with penetrating ulcer. 6. Chronic superior mesenteric artery dissection, followed by Dr Trula Slade. 7. CKD 8. CAD: NSTEMI 6/15 with 95% proximal RCA stenosis treated with DES. Unstable angina later in 6/15, this time had DES to 90% mLCx. Echo (6/15) with EF 50-55%, moderate focal basal septal hypertrophy, normal RV size and systolic function.  9. Carotid dopplers (6/15) with mild stenosis.  10. Hyperlipidemia: Did not tolerate atorvastatin. Past Medical History  Diagnosis Date  . Hypertension   . Diabetes mellitus   . Stroke   . Iliac artery aneurysm, left   . Dissection of mesenteric artery     a. Mesenteric Artery Duplex (7/15):  pSMA chronic dissection with aneurysmal dilation of 1.29 cm ([facility]);  b.  Chest CTA (02/26/14):  IMPRESSION:  1. No aortic dissection or other acute abnormality.  2. 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  3. Stable 17 mm ectasia of the left common iliac artery.  4. Atherosclerosis, including aortoiliac and coronary artery disease.   Marland Kitchen CAD (coronary artery disease)     a. LHC (02/26/14):  mLAD 20 (faint L>R collats), mCFX 50, pRCA 95 (plaque rupture) >> PCI:  Xience DES to pRCA (severe tortuosity of R innominate artery >> needs L radial or FA in future);  b. LHC (03/10/14):  CFX 90, oOM 70, pRCA stent patent >> PCI:  Xience DES to mCFX   . Myocardial infarction   .  Hx of cardiovascular stress test     Lexiscan Myoview (2/16):  Inferior lateral defect c/w thinning vs small prior infarct, no ischemia, EF 49%, Low Risk  . Hx of echocardiogram     a.  Echocardiogram (02/27/14):  Mod focal basal and mild concentric LVH, EF 50-55%, no RWMA, Gr 1 DD, mild TR, normal RVF  . Carotid stenosis     a. Carotid US (02/27/14):  Bilateral ICA 1-39%    Past Surgical History  Procedure Laterality Date  . Admission  09/26/2009     CVA.  [name].  . Cardiac catheterization    . Coronary angioplasty    . Left heart catheterization with coronary angiogram N/A 02/26/2014    Procedure: LEFT HEART CATHETERIZATION WITH CORONARY ANGIOGRAM;  Surgeon: Wellington Hampshire, MD;  Location: [place] CATH LAB;  Service: Cardiovascular;  Laterality: N/A;  . Left heart catheterization with coronary angiogram N/A 03/10/2014    Procedure: LEFT HEART CATHETERIZATION WITH CORONARY ANGIOGRAM;  Surgeon: Jettie Booze, MD;  Location: [facility] CATH LAB;  Service: Cardiovascular;  Laterality: N/A;     Current Outpatient Prescriptions  Medication Sig Dispense Refill  . aspirin EC 81 MG EC tablet Take 1 tablet (81 mg total) by mouth daily.    . Blood Glucose Monitoring Suppl (ONE TOUCH ULTRA SYSTEM KIT) W/DEVICE KIT 1 kit by Does not apply route once. One touch delica lancets, and meter Pt will test once daily. Dx 250.92 1 each 0  . clopidogrel (PLAVIX) 75 MG tablet Take 1 tablet (75 mg total) by mouth daily with breakfast. 30 tablet 11  . diphenhydrAMINE (BENADRYL) 25 MG tablet Take 50 mg by mouth daily as needed for allergies.    Marland Kitchen gabapentin (NEURONTIN) 300 MG capsule TAKE ONE CAPSULE BY MOUTH THREE TIMES DAILY 90 capsule 4  . glucose blood test strip Test blood sugar daily. Dx code: 3.92. (Patient taking differently: 1 each by Other route daily. Test blood sugar daily. Dx code: 250.92.) 100 each 3  . Insulin Glargine (LANTUS SOLOSTAR) 100 UNIT/ML Solostar Pen Inject 10 Units into the skin daily. 5 pen PRN  . Insulin Pen Needle (NOVOTWIST) 32G X 5 MM MISC Use 2 per day to inject lantus and victoza 90 each 3  . isosorbide mononitrate (IMDUR) 30 MG 24 hr tablet Take 0.5 tablets (15 mg total) by mouth daily. (Patient not taking: Reported on 01/12/2015) 30 tablet 11  . Liraglutide 18 MG/3ML SOPN Inject 0.43m daily for 1 week & then increase to 1.229mdaily. (Patient taking differently: Inject 1.2 mg as directed daily. ) 6 mL 3  . lisinopril  (PRINIVIL,ZESTRIL) 40 MG tablet Take 1 tablet (40 mg total) by mouth daily. 30 tablet 6  . metFORMIN (GLUCOPHAGE) 500 MG tablet Take 1 tablet (500 mg total) by mouth 2 (two) times daily with a meal. 180 tablet 3  . metoprolol tartrate (LOPRESSOR) 25 MG tablet Take 0.5 tablets (12.5 mg total) by mouth 2 (two) times daily. 30 tablet 6  . nitroGLYCERIN (NITROSTAT) 0.4 MG SL tablet Place 1 tablet (0.4 mg total) under the tongue every 5 (five) minutes as needed for chest pain. 25 tablet 2  . ondansetron (ZOFRAN-ODT) 8 MG disintegrating tablet Take 1 tablet (8 mg total) by mouth every 8 (eight) hours as needed for nausea or vomiting. 20 tablet 0  . ONETOUCH DELICA LANCETS 3398PISC Test blood sugar daily. Dx code: E11.8 100 each 3  . polyethylene glycol powder (GLYCOLAX/MIRALAX) powder Take 17 g by mouth  2 (two) times daily as needed. 3350 g 1  . rosuvastatin (CRESTOR) 40 MG tablet Take 1 tablet (40 mg total) by mouth daily. 30 tablet 11   No current facility-administered medications for this visit.    Allergies:   Review of patient's allergies indicates no known allergies.    Social History:  The patient  reports that he quit smoking about 5 years ago. He has never used smokeless tobacco. He reports that he does not drink alcohol or use illicit drugs.   Family History:  The patient's family history includes Diabetes in his father; Heart attack in his father; Hypertension in his father. There is no history of Stroke.    ROS:  Please see the history of present illness.    ROS   PHYSICAL EXAM: VS:  There were no vitals taken for this visit.     Wt Readings from Last 3 Encounters:  12/08/14 172 lb 6.4 oz (78.2 kg)  11/19/14 172 lb (78.019 kg)  11/17/14 170 lb (77.111 kg)     GEN: Well nourished, well developed, in no acute distress HEENT: normal Neck: no JVD, no masses Cardiac:  Normal S1/S2, RRR; no murmur, no rubs or gallops, no edema         Respiratory:  clear to auscultation  bilaterally, no wheezing, rhonchi or rales. GI: soft, nontender, nondistended, + BS MS: no deformity or atrophy Skin: warm and dry  Neuro:  CNs II-XII intact, Strength and sensation are intact Psych: Normal affect   EKG:  EKG is ordered today.  It demonstrates:      Recent Labs: 03/07/2014: Pro B Natriuretic peptide (BNP) 420.7* 01/12/2015: ALT 45; BUN 14; Creatinine 1.10; Hemoglobin 17.3*; Platelets 181; Potassium 4.4; Sodium 139    Lipid Panel    Component Value Date/Time   CHOL 215* 12/18/2014 0834   TRIG 186.0* 12/18/2014 0834   HDL 34.90* 12/18/2014 0834   CHOLHDL 6 12/18/2014 0834   VLDL 37.2 12/18/2014 0834   LDLCALC 143* 12/18/2014 0834   LDLDIRECT 87.7 04/28/2014 0838      ASSESSMENT AND PLAN:  1.  CAD: He is s/p PCI to LCx and RCA. EF low normal on echo. He is on ASA 81, Plavix 75, metoprolol, ACEI, nitrates and statin. Recent Myoview was low risk.   2.  PAD: Penetrating ulcer left CIA and chronic SMA dissection. Last CTA abdomen did not show any changes in the appearance of the SMA. Patient has followup with vascular surgery later this year.  3.  Smoking: He is staying off cigarettes.   4.  Hyperlipidemia:  Continue statin.    5.  HTN:  Controlled on current regimen.   6.  AAA: Will be followed by [facility].   7.  Snoring:  He has symptoms that are suspicious for OSA.  He has refused Sleep Study.   Current medicines are reviewed at length with the patient today.  Any concerns regarding medicines as above.  The following changes have been made:        Labs/ tests ordered today include:  No orders of the defined types were placed in this encounter.     Disposition:   FU    Signed, Richardson Dopp, PA-C, MHS 01/21/2015 10:10 PM    [facility] [address], [place], [place]  [address] Phone: [phone]; Fax: [phone]    This encounter was created in error - please disregard.

## 2015-01-22 ENCOUNTER — Encounter: Payer: Medicare Other | Admitting: Physician Assistant

## 2015-01-22 ENCOUNTER — Other Ambulatory Visit: Payer: Medicare Other

## 2015-01-23 ENCOUNTER — Other Ambulatory Visit: Payer: Medicare Other

## 2015-02-12 ENCOUNTER — Other Ambulatory Visit (INDEPENDENT_AMBULATORY_CARE_PROVIDER_SITE_OTHER): Payer: Medicare Other | Admitting: *Deleted

## 2015-02-12 ENCOUNTER — Ambulatory Visit (INDEPENDENT_AMBULATORY_CARE_PROVIDER_SITE_OTHER): Payer: Medicare Other | Admitting: Cardiology

## 2015-02-12 ENCOUNTER — Encounter: Payer: Self-pay | Admitting: Cardiology

## 2015-02-12 ENCOUNTER — Other Ambulatory Visit: Payer: Self-pay | Admitting: *Deleted

## 2015-02-12 VITALS — BP 140/90 | HR 81 | Ht 68.0 in | Wt 169.0 lb

## 2015-02-12 DIAGNOSIS — I2510 Atherosclerotic heart disease of native coronary artery without angina pectoris: Secondary | ICD-10-CM

## 2015-02-12 DIAGNOSIS — E785 Hyperlipidemia, unspecified: Secondary | ICD-10-CM

## 2015-02-12 DIAGNOSIS — I10 Essential (primary) hypertension: Secondary | ICD-10-CM | POA: Diagnosis not present

## 2015-02-12 DIAGNOSIS — I5032 Chronic diastolic (congestive) heart failure: Secondary | ICD-10-CM | POA: Diagnosis not present

## 2015-02-12 DIAGNOSIS — I25119 Atherosclerotic heart disease of native coronary artery with unspecified angina pectoris: Secondary | ICD-10-CM

## 2015-02-12 LAB — HEPATIC FUNCTION PANEL
ALT: 23 U/L (ref 0–53)
AST: 27 U/L (ref 0–37)
Albumin: 4 g/dL (ref 3.5–5.2)
Alkaline Phosphatase: 64 U/L (ref 39–117)
Bilirubin, Direct: 0.1 mg/dL (ref 0.0–0.3)
Total Bilirubin: 0.4 mg/dL (ref 0.2–1.2)
Total Protein: 7 g/dL (ref 6.0–8.3)

## 2015-02-12 MED ORDER — GLUCOSE BLOOD VI STRP: ORAL_STRIP | Status: DC

## 2015-02-12 MED ORDER — CLOPIDOGREL BISULFATE 75 MG PO TABS: 75.0000 mg | ORAL_TABLET | Freq: Every day | ORAL | Status: DC

## 2015-02-12 MED ORDER — LISINOPRIL 40 MG PO TABS: 40.0000 mg | ORAL_TABLET | Freq: Every day | ORAL | Status: DC

## 2015-02-12 MED ORDER — ROSUVASTATIN CALCIUM 40 MG PO TABS: 40.0000 mg | ORAL_TABLET | Freq: Every day | ORAL | Status: DC

## 2015-02-12 NOTE — Addendum Note (Signed)
Addended by: Eulis Foster on: 02/12/2015 02:03 PM   Modules accepted: Orders

## 2015-02-12 NOTE — Patient Instructions (Signed)
Medication Instructions:  Dr Aundra Dubin wants you to take the following medications:  Aspirin 81mg  daily. Plavix 75mg  daily. Lisinopril 40mg  daily. Crestor 40mg  daily.  Labwork: Your physician recommends that you return for a FASTING lipid profile /liver profile/BMET in 2 months.    Testing/Procedures: None today.  Follow-Up: Your physician recommends that you schedule a follow-up appointment in: 2 months with Versie Starks.  You have been referred to Dr Silvestre Mesi for management of your diabetes and primary care.

## 2015-02-14 NOTE — Progress Notes (Signed)
Patient ID: Barry [name], male   DOB: Feb 09, 1951, 64 y.o.   MRN: [id number] PCP: Dr. Lenna Sciara. [place]  64 yo with history of CAD and chronic superior mesenteric artery dissection presents for his first outpatient cardiology followup. Patient was admitted in 6/15 to [facility] with chest pain and NSTEMI.  He had LHC showing 95% proximal RCA stenosis and had DES to RCA.  He was subsequently readmitted later in 6/15 with unstable angina and had another cath, showing 90% LCx stenosis.  This was treated with DES to LCx.  He was readmitted a 2nd time 03/18/14 with abdominal pain.  Lipase was mildly elevated Saine pancreatitis was considered.  CTA abdomen showed no change to chronic SMA dissection.  He had Lexiscan Cardiolite in 2/16 with no ischemia.   He speaks no [place] but an interpretor was present today. Rare abdominal pain now.  No exertional dyspnea.  He walks outside daily, normally with no problems.  He is staying off cigarettes.  No chest pain.  He has quit all his medications except gabapentin.  Says he was taking too much medicine.  BP is mildly elevated today.   Labs (6/15): K 4.4, creatinine 1.03, AST 47, ALT 69, HCT 38.9 Labs (8/15): K 3.8, creatinine 1.1, LDL 88, LFTs normal Labs (3/16): LDL 143, HDL 35 (off statin) Labs (4/16): K 4.4, creatinine 1.1  PMH: 1. HTN 2. Type II diabetes 3. CVA in 2011 4. AAA: 2.2 cm in 3/15.  5. PAD: Left CIA with penetrating ulcer. 6. Chronic superior mesenteric artery dissection, followed by Dr Trula Slade. 7. CKD 8. CAD: NSTEMI 6/15 with 95% proximal RCA stenosis treated with DES.  Unstable angina later in 6/15, this time had DES to 90% mLCx.  Echo (6/15) with EF 50-55%, moderate focal basal septal hypertrophy, normal RV size and systolic function. Lexiscan Cardiolite (2/16) with EF 49%, small fixed inferolateral defect with no ischemia.  9. Carotid dopplers (6/15) with mild stenosis.  10. Hyperlipidemia: Did not tolerate atorvastatin.   SH: Married, from [place] and does  not speak English, quit smoking in 6/15.    FH: Father with MI.  ROS: All systems reviewed and negative except as per HPI. \  Current Outpatient Prescriptions  Medication Sig Dispense Refill  . aspirin 81 MG EC tablet Take 1 tablet (81 mg total) by mouth daily.    . Blood Glucose Monitoring Suppl (ONE TOUCH ULTRA SYSTEM KIT) W/DEVICE KIT 1 kit by Does not apply route once. One touch delica lancets, and meter Pt will test once daily. Dx 250.92 1 each 0  . clopidogrel (PLAVIX) 75 MG tablet Take 1 tablet (75 mg total) by mouth daily with breakfast. 90 tablet 1  . fluorometholone (FML) 0.1 % ophthalmic suspension Place 1 drop into both eyes 2 (two) times daily.    Marland Kitchen gabapentin (NEURONTIN) 300 MG capsule TAKE ONE CAPSULE BY MOUTH THREE TIMES DAILY (Patient taking differently: TAKE ONE CAPSULE BY MOUTH DAILY) 90 capsule 4  . glucose blood test strip Test blood sugar daily. Dx code: 250.92. 100 each 0  . Insulin Glargine (LANTUS SOLOSTAR) 100 UNIT/ML Solostar Pen Inject 10 Units into the skin daily. 5 pen PRN  . Insulin Pen Needle (NOVOTWIST) 32G X 5 MM MISC Use 2 per day to inject lantus and victoza 90 each 3  . Liraglutide 18 MG/3ML SOPN Inject 0.35m daily for 1 week & then increase to 1.261mdaily. 6 mL 3  . lisinopril (PRINIVIL,ZESTRIL) 40 MG tablet Take 1 tablet (40 mg total)  by mouth daily. 90 tablet 1  . metFORMIN (GLUCOPHAGE) 500 MG tablet Take 1 tablet (500 mg total) by mouth 2 (two) times daily with a meal. 180 tablet 3  . ondansetron (ZOFRAN-ODT) 8 MG disintegrating tablet Take 1 tablet (8 mg total) by mouth every 8 (eight) hours as needed for nausea or vomiting. 20 tablet 0  . ONETOUCH DELICA LANCETS 00L MISC Test blood sugar daily. Dx code: E11.8 100 each 3  . rosuvastatin (CRESTOR) 40 MG tablet Take 1 tablet (40 mg total) by mouth daily. 90 tablet 0  . diphenhydrAMINE (BENADRYL) 25 MG tablet Take 50 mg by mouth daily as needed for allergies.    . nitroGLYCERIN (NITROSTAT) 0.4 MG SL  tablet Place 1 tablet (0.4 mg total) under the tongue every 5 (five) minutes as needed for chest pain. (Patient not taking: Reported on 02/12/2015) 25 tablet 2   No current facility-administered medications for this visit.    BP 140/90 mmHg  Pulse 81  Ht _0  (1.727 m)  Wt 169 lb (76.658 kg)  BMI 25.70 kg/m2 General: NAD Neck: No JVD, no thyromegaly or thyroid nodule.  Lungs: Clear to auscultation bilaterally with normal respiratory effort. CV: Nondisplaced PMI.  Heart regular S1/S2, no S3/S4, no murmur.  No peripheral edema.  No carotid bruit.  Normal pedal pulses.  Abdomen: Soft, nontender, no hepatosplenomegaly, no distention.  Skin: Intact without lesions or rashes.  Neurologic: Alert and oriented x 3.  Psych: Normal affect. Extremities: No clubbing or cyanosis.   Assessment/Plan: 1. CAD: Stable s/p PCI to LCx and RCA.  EF low normal on echo, mildly decreased on 2/16 Cardiolite (which showed no ischemia).  No chest pain.  He has stopped all his medications.  I asked him to restart ASA 81, Palvix 75 daily, lisinopril 40 mg daily, and Crestor 40 mg daily.  He agrees to take these.  2. PAD: Penetrating ulcer left CIA and chronic SMA dissection.  Last CTA abdomen did not show any changes in the appearance of the SMA.  Patient has followup with [facility] in 8/16. He is not having much abdominal pain at this time.  3. Smoking: He is staying off cigarettes.  4. Hyperlipidemia: He will restart Crestor, will check lipids/LFTs in 2 months.  5. HTN: BP too high, restart lisinopril 40 mg daily.  6. AAA: Will be followed by [facility].  7. Needs to see PCP for diabetes management (has not been regular about this).   Loralie Champagne 02/14/2015

## 2015-02-19 ENCOUNTER — Encounter: Payer: Self-pay | Admitting: Physician Assistant

## 2015-03-14 DIAGNOSIS — R42 Dizziness and giddiness: Secondary | ICD-10-CM | POA: Diagnosis not present

## 2015-03-18 IMAGING — CR DG CHEST 1V PORT
1 series · 1 of 1 positions shown · non-contrast
Comparison: 02/26/2014

CLINICAL DATA: Syncope.  Chest pain.

EXAM:
PORTABLE CHEST - 1 VIEW

[AP]
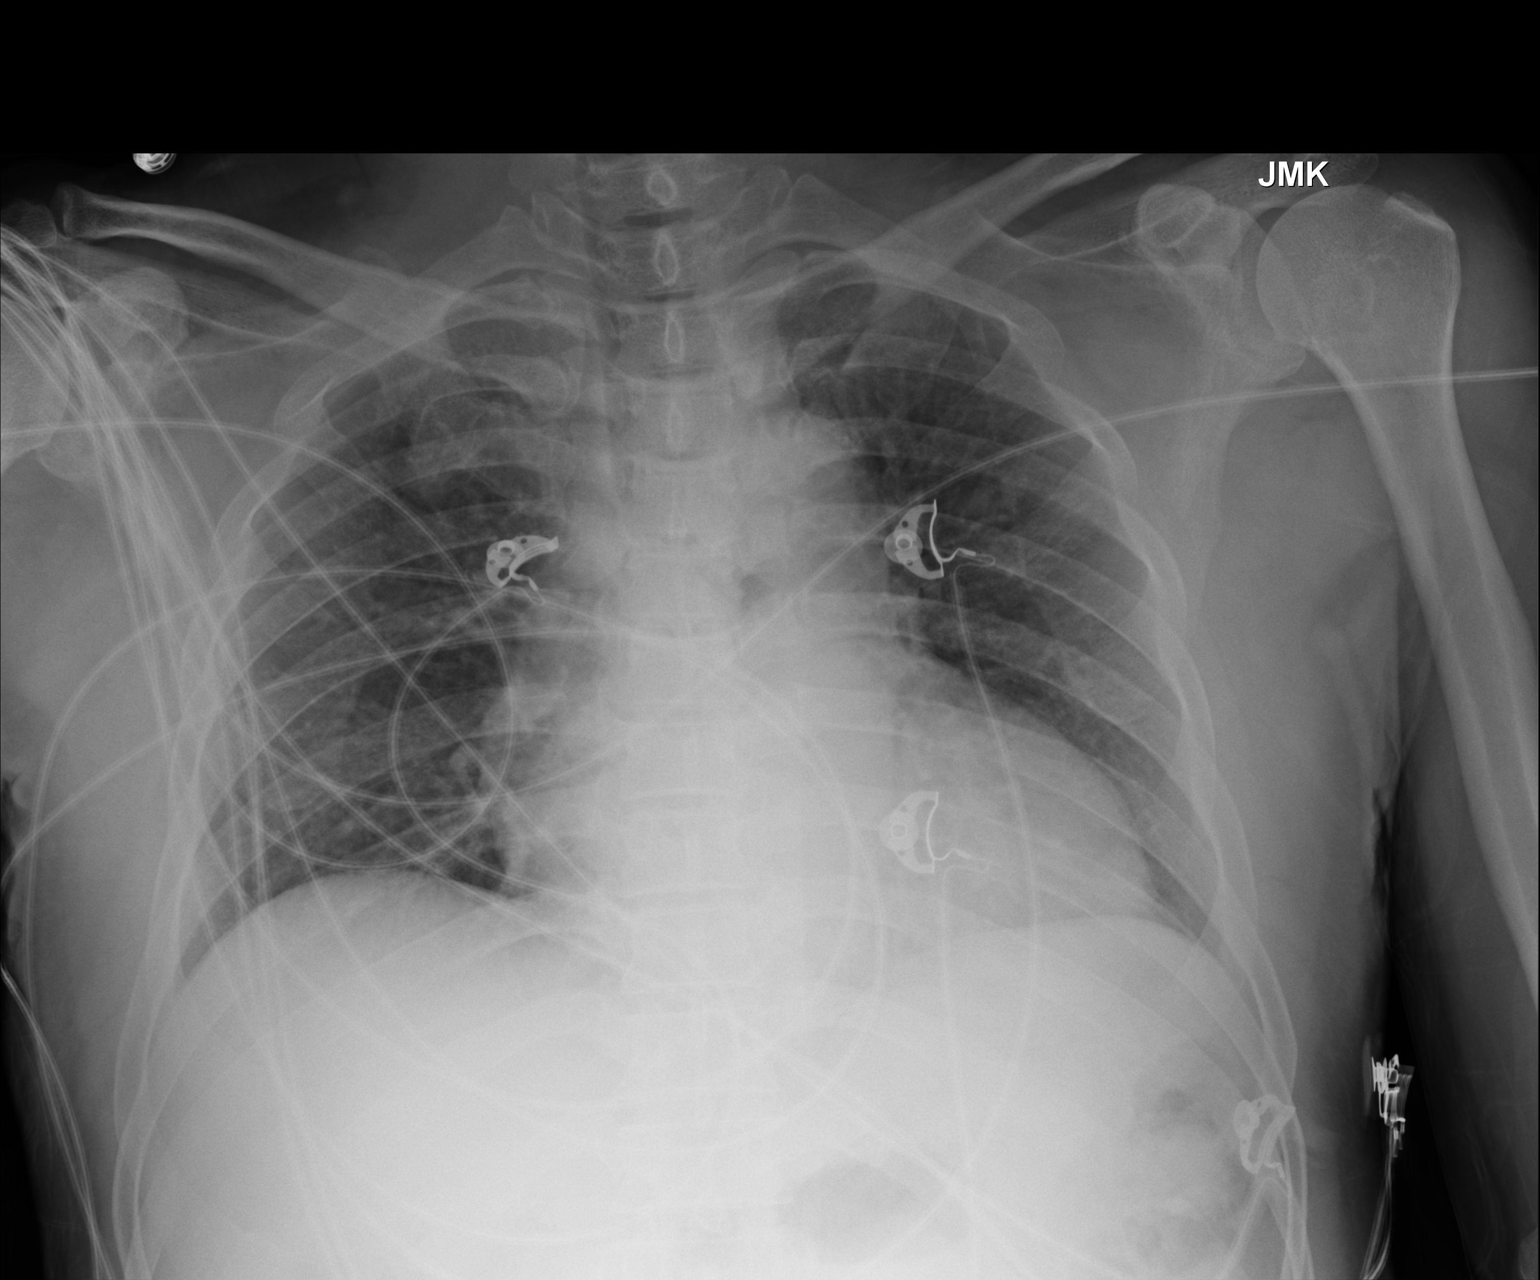

[1 of 1 positions shown; findings below may reference images not displayed]

FINDINGS: Low lung volumes. Heart size is accentuated by the low volumes an AP
portable nature of the study. No confluent opacities or effusions.
No acute bony abnormality.
IMPRESSION: Low lung volumes.  No active disease.

## 2015-03-27 ENCOUNTER — Ambulatory Visit: Payer: Medicare Other | Admitting: Family Medicine

## 2015-04-01 ENCOUNTER — Encounter: Payer: Self-pay | Admitting: Endocrinology

## 2015-04-02 NOTE — Progress Notes (Deleted)
Here for f/u OV - pt last in office 6 mos prior but he is new to me. Pt speaks very little english - from [place]. Judeth Cornfield has family member interpret Type 2 DM: followed by Dr. Dwyane Dee  Last seen 4 mos prior but cardiology told him to f/u here w/ PCP for this.  Has future orders for urine microalb and fructosamine placed 4 mos ago by Dr. Dwyane Dee. hgba1c last checked 6 mos ago at 9.0.  Pt does not have f/u endocrine appt sched.  Has peripheral neuropathy.  Started on victoza at that last visit along with lantus 10u w/ instructions to increase lantus if cbgs remain uncontrolled.  Did see nutrition at last visit as well.  Metabolic syndrome  Atherosclerotic disease/vasculopath: CVA in 2011, TIA, CAD w/ h/o MI w/ multiple stents and w/ CHF EF 50-55%, h/o chronic mesenteric artery dissection (followed by Dr. Trula Slade).  Sees cardiology Dr. Aundra Dubin since h/o NSTEMI 02/2014. Lexiscan cardiolite 10/2014 w/ no ischemia.  Also has AAA that was measured at 2.2cm on 11/2013. Also with PAD w/ worst at left CIA w/ ulcer.Carotid dopplers on 02/2014 showed mild stenosis.  Did not tolerate atorvastatin.Saw cardiology 6 wks ago and pt had stopped all of his medicine except gabapentin at that time as he thought he was taking to much medicine.  He was instructed to restart asa 81, plavix, lisinopril 40 and crestor 40 to which pt consented.  Has f/u for PAD/chronic ulcer/ and chronic SMA dissection sched at [facility] in 05/04/2015.  Due for flp and cmp  - future orders already entered by cards and pt has appt sched w/ cards on 7/25 (2 wks) to get hese labs.  HTN.    Has h/o poss mild pancreatitis 1 yr prior.  Tobacco use: quit 02/2014.  CKD:  Non-compliance - sig worsened by language and culture barriers - often stops/skips med and many no-shows prior. Just turned 64 yo 2 mos prior [place] sched [facility] visit.

## 2015-04-02 NOTE — Patient Outreach (Signed)
[facility] [facility]) Care Management  04/02/2015  Barry [name] [birth year] [id number]   Referral from [place] List, Maury Dus, RN assigned to outreach.  Ronnell Freshwater. [place], [place] Management [place] Assistant Phone: [phone] Fax: [phone]

## 2015-04-03 ENCOUNTER — Encounter: Payer: Self-pay | Admitting: Family Medicine

## 2015-04-03 ENCOUNTER — Ambulatory Visit (INDEPENDENT_AMBULATORY_CARE_PROVIDER_SITE_OTHER): Payer: Medicare Other | Admitting: Family Medicine

## 2015-04-03 VITALS — BP 150/105 | HR 88 | Temp 98.4°F | Resp 16 | Wt 164.8 lb

## 2015-04-03 DIAGNOSIS — I70209 Unspecified atherosclerosis of native arteries of extremities, unspecified extremity: Secondary | ICD-10-CM | POA: Insufficient documentation

## 2015-04-03 DIAGNOSIS — G4700 Insomnia, unspecified: Secondary | ICD-10-CM | POA: Diagnosis not present

## 2015-04-03 DIAGNOSIS — I10 Essential (primary) hypertension: Secondary | ICD-10-CM | POA: Diagnosis not present

## 2015-04-03 DIAGNOSIS — G629 Polyneuropathy, unspecified: Secondary | ICD-10-CM

## 2015-04-03 DIAGNOSIS — R1084 Generalized abdominal pain: Secondary | ICD-10-CM | POA: Diagnosis not present

## 2015-04-03 DIAGNOSIS — Z789 Other specified health status: Secondary | ICD-10-CM

## 2015-04-03 DIAGNOSIS — Z91199 Patient's noncompliance with other medical treatment and regimen due to unspecified reason: Secondary | ICD-10-CM

## 2015-04-03 DIAGNOSIS — L98499 Non-pressure chronic ulcer of skin of other sites with unspecified severity: Secondary | ICD-10-CM | POA: Diagnosis not present

## 2015-04-03 DIAGNOSIS — B353 Tinea pedis: Secondary | ICD-10-CM | POA: Diagnosis not present

## 2015-04-03 DIAGNOSIS — I739 Peripheral vascular disease, unspecified: Secondary | ICD-10-CM | POA: Diagnosis not present

## 2015-04-03 DIAGNOSIS — Z9119 Patient's noncompliance with other medical treatment and regimen: Secondary | ICD-10-CM | POA: Diagnosis not present

## 2015-04-03 DIAGNOSIS — I2510 Atherosclerotic heart disease of native coronary artery without angina pectoris: Secondary | ICD-10-CM | POA: Diagnosis not present

## 2015-04-03 DIAGNOSIS — E118 Type 2 diabetes mellitus with unspecified complications: Secondary | ICD-10-CM | POA: Diagnosis not present

## 2015-04-03 DIAGNOSIS — K5900 Constipation, unspecified: Secondary | ICD-10-CM | POA: Diagnosis not present

## 2015-04-03 DIAGNOSIS — E785 Hyperlipidemia, unspecified: Secondary | ICD-10-CM

## 2015-04-03 LAB — POCT GLYCOSYLATED HEMOGLOBIN (HGB A1C): Hemoglobin A1C: 6.9

## 2015-04-03 LAB — GLUCOSE, POCT (MANUAL RESULT ENTRY): POC Glucose: 149 mg/dl — AB (ref 70–99)

## 2015-04-03 LAB — MICROALBUMIN / CREATININE URINE RATIO
Creatinine, Urine: 441.4 mg/dL
Microalb Creat Ratio: 27.2 mg/g (ref 0.0–30.0)
Microalb, Ur: 12 mg/dL — ABNORMAL HIGH (ref <2.0)

## 2015-04-03 LAB — LIPASE: Lipase: 50 U/L (ref 0–75)

## 2015-04-03 MED ORDER — NORTRIPTYLINE HCL 25 MG PO CAPS: 25.0000 mg | ORAL_CAPSULE | Freq: Every day | ORAL | Status: DC

## 2015-04-03 MED ORDER — GLUCOSE BLOOD VI STRP: ORAL_STRIP | Status: DC

## 2015-04-03 MED ORDER — TERBINAFINE HCL 1 % EX CREA: 1.0000 "application " | TOPICAL_CREAM | Freq: Two times a day (BID) | CUTANEOUS | Status: DC

## 2015-04-03 MED ORDER — ONETOUCH ULTRASOFT LANCETS MISC: Status: DC

## 2015-04-03 MED ORDER — GABAPENTIN 300 MG PO CAPS: 300.0000 mg | ORAL_CAPSULE | Freq: Three times a day (TID) | ORAL | Status: DC

## 2015-04-03 MED ORDER — RANITIDINE HCL 150 MG PO TABS: 150.0000 mg | ORAL_TABLET | Freq: Two times a day (BID) | ORAL | Status: DC

## 2015-04-03 MED ORDER — CARVEDILOL 6.25 MG PO TABS: 6.2500 mg | ORAL_TABLET | Freq: Two times a day (BID) | ORAL | Status: DC

## 2015-04-03 MED ORDER — POLYETHYLENE GLYCOL 3350 17 GM/SCOOP PO POWD: 17.0000 g | Freq: Two times a day (BID) | ORAL | Status: DC | PRN

## 2015-04-03 MED ORDER — DOCUSATE SODIUM 100 MG PO CAPS: 100.0000 mg | ORAL_CAPSULE | Freq: Every day | ORAL | Status: DC

## 2015-04-03 NOTE — Progress Notes (Addendum)
Subjective:    Patient ID: Barry [name], male    DOB: 06/17/1951, 64 y.o.   MRN: [id number] This chart was scribed for Barry Cheadle, MD by Zola Button, Medical Scribe. This patient was seen in Room 22 and the patient's care was started at 10:14 AM.   Chief Complaint  Patient presents with  . Diabetes    needs refill on strips  . high blood pressure    HPI HPI Comments: Barry [name] is a 64 y.o. male with a hx of DM and hypertension who presents to the [facility] for a follow-up.  Here for f/u OV - pt last in office 6 mos prior but he is new to me. He was sent here by his cardiologist to follow-up on his diabetes. Pt speaks very little [place] - from [place]. His wife is here with him today as well as they have bought an [place] health care interpreter.  Type 2 DM: followed by Dr. Dwyane Dee Last seen 4 mos prior but cardiology told him to f/u here w/ PCP for this. Has future orders for urine microalb and fructosamine placed 4 mos ago by Dr. Dwyane Dee. hgba1c last checked 6 mos ago at 9.0. Pt does not have f/u endocrine appt sched. Has peripheral neuropathy. Started on victoza at that last visit along with lantus 10u w/ instructions to increase lantus if cbgs remain uncontrolled. Did see nutrition at last visit as well. Patient has 4 more refills of gabapentin; 1 month supply filled in February which is not yet empty. He has been taking insulin shots at home and has not been taking any pills for diabetes. His fasting AM blood sugars have been 88-126. He has had occasional hypoglycemic episodes which have been recognized and treated w/o difficulty. He has not checked his blood sugar when having these episodes. He is still having leg pain which does make it difficulty for him to sleep.  Metabolic syndrome   Atherosclerotic disease/vasculopath: CVA in 2011, TIA, CAD w/ h/o MI w/ multiple stents and w/ CHF EF 50-55%, h/o chronic mesenteric artery dissection (followed by Dr. Trula Slade). Sees  cardiology Dr. Aundra Dubin since h/o NSTEMI 02/2014. Lexiscan cardiolite 10/2014 w/ no ischemia. Also has AAA that was measured at 2.2cm on 11/2013. Also with PAD w/ worst at left CIA w/ ulcer. Carotid dopplers on 02/2014 showed mild stenosis. Did not tolerate atorvastatin. Saw cardiology 6 wks ago and pt had stopped all of his medicine except gabapentin at that time as he thought he was taking too much medicine. He was instructed to restart asa 81, plavix, lisinopril 40 and crestor 40 to which pt consented. Has f/u for PAD/chronic ulcer/ and chronic SMA dissection sched at [facility] in 05/04/2015. Due for flp and cmp - future orders already entered by cards and pt has appt sched w/ cards on 7/25 (2 wks) to get these labs. He has a year's worth of refill of his Crestor, which he seems to be taking as prescribed. He has a 90-day day refill left of Plavix. Patient has been having episodes of chest pain/tightness about twice a month; he takes nitroglycerin when he has these episodes which does provide relief.   Hypertension: He has a 90 day refill of his lisinopril, which he seems to be taking as prescribed. He has not been taking the metoprolol as prescribed. His blood pressures have been running 150-170s/90-107; his pulse has been 70s-80s.  Has h/o poss mild pancreatitis 1 yr prior.   Tobacco use: quit  02/2014. He has not smoked since then.  Chronic Kidney Disease:   Non-compliance - significantly worsened by language and culture barriers - often stops/skips med and many no-shows prior. Just turned 64 yo 2 mos prior Barry [name] sched [facility] visit.   Constipation/Abdominal Pain: Patient sometimes has constipation which he believes to be due to his medications; he has associated abdominal pain with this. He has been using a powder which he mixes with water for this, but he does not think this is strong enough. Past Medical History  Diagnosis Date  . Hypertension   . Diabetes mellitus   . Stroke   . Iliac artery aneurysm, left    . Dissection of mesenteric artery     a. Mesenteric Artery Duplex (7/15):  pSMA chronic dissection with aneurysmal dilation of 1.29 cm ([facility]);  b.  Chest CTA (02/26/14):  IMPRESSION:  1. No aortic dissection or other acute abnormality.  2. 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  3. Stable 17 mm ectasia of the left common iliac artery.  4. Atherosclerosis, including aortoiliac and coronary artery disease.   Marland Kitchen CAD (coronary artery disease)     a. LHC (02/26/14):  mLAD 20 (faint L>R collats), mCFX 50, pRCA 95 (plaque rupture) >> PCI:  Xience DES to pRCA (severe tortuosity of R innominate artery >> needs L radial or FA in future);  b. LHC (03/10/14):  CFX 90, oOM 70, pRCA stent patent >> PCI:  Xience DES to mCFX   . Myocardial infarction   . Hx of cardiovascular stress test     Lexiscan Myoview (2/16):  Inferior lateral defect c/w thinning vs small prior infarct, no ischemia, EF 49%, Low Risk  . Hx of echocardiogram     a.  Echocardiogram (02/27/14):  Mod focal basal and mild concentric LVH, EF 50-55%, no RWMA, Gr 1 DD, mild TR, normal RVF  . Carotid stenosis     a. Carotid US (02/27/14):  Bilateral ICA 1-39%   Past Surgical History  Procedure Laterality Date  . Admission  09/26/2009    CVA.  [name].  . Cardiac catheterization    . Coronary angioplasty    . Left heart catheterization with coronary angiogram N/A 02/26/2014    Procedure: LEFT HEART CATHETERIZATION WITH CORONARY ANGIOGRAM;  Surgeon: Wellington Hampshire, MD;  Location: [facility] CATH LAB;  Service: Cardiovascular;  Laterality: N/A;  . Left heart catheterization with coronary angiogram N/A 03/10/2014    Procedure: LEFT HEART CATHETERIZATION WITH CORONARY ANGIOGRAM;  Surgeon: Jettie Booze, MD;  Location: [facility] CATH LAB;  Service: Cardiovascular;  Laterality: N/A;   Current Outpatient Prescriptions on File Prior to Visit  Medication Sig Dispense Refill  . aspirin 81 MG EC tablet Take 1 tablet (81 mg total) by mouth daily.    . Blood  Glucose Monitoring Suppl (ONE TOUCH ULTRA SYSTEM KIT) W/DEVICE KIT 1 kit by Does not apply route once. One touch delica lancets, and meter Pt will test once daily. Dx 250.92 1 each 0  . clopidogrel (PLAVIX) 75 MG tablet Take 1 tablet (75 mg total) by mouth daily with breakfast. 90 tablet 1  . fluorometholone (FML) 0.1 % ophthalmic suspension Place 1 drop into both eyes 2 (two) times daily.    . Insulin Glargine (LANTUS SOLOSTAR) 100 UNIT/ML Solostar Pen Inject 10 Units into the skin daily. 5 pen PRN  . Insulin Pen Needle (NOVOTWIST) 32G X 5 MM MISC Use 2 per day to inject lantus and victoza 90 each 3  .  lisinopril (PRINIVIL,ZESTRIL) 40 MG tablet Take 1 tablet (40 mg total) by mouth daily. 90 tablet 1  . nitroGLYCERIN (NITROSTAT) 0.4 MG SL tablet Place 1 tablet (0.4 mg total) under the tongue every 5 (five) minutes as needed for chest pain. 25 tablet 2  . ONETOUCH DELICA LANCETS 83T MISC Test blood sugar daily. Dx code: E11.8 100 each 3  . rosuvastatin (CRESTOR) 40 MG tablet Take 1 tablet (40 mg total) by mouth daily. 90 tablet 0  . diphenhydrAMINE (BENADRYL) 25 MG tablet Take 50 mg by mouth daily as needed for allergies.    . Liraglutide 18 MG/3ML SOPN Inject 0.33m daily for 1 week & then increase to 1.24mdaily. 6 mL 3   No current facility-administered medications on file prior to visit.   Not on File Family History  Problem Relation Age of Onset  . Diabetes Father   . Hypertension Father   . Heart attack Father   . Stroke Neg Hx    History   Social History  . Marital Status: Married    Spouse Name: N/A  . Number of Children: N/A  . Years of Education: N/A   Social History Main Topics  . Smoking status: Former Smoker    Quit date: 06/09/2009  . Smokeless tobacco: Never Used  . Alcohol Use: No  . Drug Use: No  . Sexual Activity: Not Currently   Other Topics Concern  . None   Social History Narrative   Marital: married.      Lives: with son,wife.       Children: 3  children; 6 grandchildren      Employed: unemployed; disability unknown reason/CVA L sided weakness      Tobacco:  Quit 2012; smoked 40 years.       Alcohol: no drinking now; social in past.       Drugs: none       Exercise: sporadic.       ADLs:  No driving since CVA.     Review of Systems  Constitutional: Positive for fatigue. Negative for fever, chills, diaphoresis, activity change, appetite change and unexpected weight change.  Respiratory: Positive for chest tightness. Negative for cough, shortness of breath and wheezing.   Cardiovascular: Positive for chest pain. Negative for palpitations and leg swelling.  Gastrointestinal: Positive for abdominal pain and constipation. Negative for nausea, vomiting, diarrhea, blood in stool and anal bleeding.  Musculoskeletal: Positive for myalgias, back pain and arthralgias. Negative for joint swelling and gait problem.  Skin: Negative for color change, rash and wound.  Neurological: Positive for numbness. Negative for dizziness, weakness, light-headedness and headaches.  Hematological: Negative for adenopathy.  Psychiatric/Behavioral: Positive for sleep disturbance.       Objective:  BP 150/105 mmHg  Pulse 88  Temp(Src) 98.4 F (36.9 C) (Oral)  Resp 16  Wt 164 lb 12.8 oz (74.753 kg)  Physical Exam  Constitutional: He is oriented to person, place, and time. He appears well-developed and well-nourished. No distress.  HENT:  Head: Normocephalic and atraumatic.  Mouth/Throat: Oropharynx is clear and moist. No oropharyngeal exudate.  Eyes: Pupils are equal, round, and reactive to light.  Neck: Neck supple. No thyromegaly present.  Thyroid normal.  Cardiovascular: Normal rate, regular rhythm, S1 normal, S2 normal and normal heart sounds.   No murmur heard. Pulmonary/Chest: Effort normal and breath sounds normal. No respiratory distress. He has no wheezes. He has no rales.  Clear to auscultation bilaterally.   Musculoskeletal: He exhibits  no edema.  Neurological: He is alert and oriented to person, place, and time. No cranial nerve deficit.  Skin: Skin is warm and dry.  Thickened, yellow, cracking toenails with scaling, peeling, white skin over all toes and between toes. No maceration.  Psychiatric: He has a normal mood and affect. His behavior is normal.  Vitals reviewed.         Assessment & Plan:   1. Type 2 diabetes mellitus with complication - much improved. Needs to f/u w. Dr. Dwyane Dee  2. Peripheral neuropathy - try doubling up on qhs gabapentin for improved pain control o/n. Start TCA  3. Atherosclerotic peripheral vascular disease with ulceration   4. Dyslipidemia, goal LDL below 70  - needs flp at f/u  5. Language barrier   6. Non compliance with medical treatment   7. Constipation, unspecified constipation type - increase miralax, add in colace prn  8. Tinea pedis of both feet - start top antifungal.  9. Insomnia   10. Essential hypertension - not at goal on lisinopril 40 Tax start carvedilol bid 6.25 as well.  11. Generalized abdominal pain - start H2 blocker  12. Coronary artery disease involving native coronary artery of native heart without angina pectoris        Orders Placed This Encounter  Procedures  . Microalbumin/Creatinine Ratio, Urine  . Lipase  . POCT glucose (manual entry)  . POCT glycosylated hemoglobin (Hb A1C)    Meds ordered this encounter  Medications  . gabapentin (NEURONTIN) 300 MG capsule    Sig: Take 1 capsule (300 mg total) by mouth 3 (three) times daily. And 2 tabs as needed at night for leg pain    Dispense:  90 capsule    Refill:  4  . terbinafine (LAMISIL) 1 % cream    Sig: Apply 1 application topically 2 (two) times daily. To feet for a month    Dispense:  42 g    Refill:  3  . polyethylene glycol powder (GLYCOLAX/MIRALAX) powder    Sig: Take 17 g by mouth 2 (two) times daily as needed for mild constipation.    Dispense:  500 g    Refill:  11  . docusate sodium  (COLACE) 100 MG capsule    Sig: Take 1 capsule (100 mg total) by mouth daily. For constipation    Dispense:  90 capsule    Refill:  1  . nortriptyline (PAMELOR) 25 MG capsule    Sig: Take 1 capsule (25 mg total) by mouth at bedtime.    Dispense:  30 capsule    Refill:  3  . ranitidine (ZANTAC) 150 MG tablet    Sig: Take 1 tablet (150 mg total) by mouth 2 (two) times daily. For abdominal pain    Dispense:  60 tablet    Refill:  5  . carvedilol (COREG) 6.25 MG tablet    Sig: Take 1 tablet (6.25 mg total) by mouth 2 (two) times daily with a meal.    Dispense:  60 tablet    Refill:  3  . Lancets (ONETOUCH ULTRASOFT) lancets    Sig: Use as instructed    Dispense:  100 each    Refill:  12  . glucose blood test strip    Sig: Test blood sugar daily. Dx code: 60.92.    Dispense:  100 each    Refill:  0    FILL WITH TRUE TEST ELECTRO OR STRIPS THAT WORK W/PT'S METER.   Over 40 min spent in face-to-face evaluation  of and consultation with patient and coordination of care.  Over 50% of this time was spent counseling this patient. Language level caveat.  I personally performed the services described in this documentation, which was scribed in my presence. The recorded information has been reviewed and considered, and addended by me as needed.  Barry Cheadle, MD MPH  Results for orders placed or performed in visit on 04/03/15  Microalbumin/Creatinine Ratio, Urine  Result Value Ref Range   Microalb, Ur 12.0 (H) <2.0 mg/dL   Creatinine, Urine 441.4 mg/dL   Microalb Creat Ratio 27.2 0.0 - 30.0 mg/g  Lipase  Result Value Ref Range   Lipase 50 0 - 75 U/L  POCT glucose (manual entry)  Result Value Ref Range   POC Glucose 149 (A) 70 - 99 mg/dl  POCT glycosylated hemoglobin (Hb A1C)  Result Value Ref Range   Hemoglobin A1C 6.9   \

## 2015-04-03 NOTE — Patient Instructions (Signed)
Diabetes and Standards of Medical Care Diabetes is complicated. You may find that your diabetes team includes a dietitian, nurse, diabetes educator, eye doctor, and more. To help everyone know what is going on and to help you get the care you deserve, the following schedule of care was developed to help keep you on track. Below are the tests, exams, vaccines, medicines, education, and plans you will need. HbA1c test This test shows how well you have controlled your glucose over the past 2-3 months. It is used to see if your diabetes management plan needs to be adjusted.   It is performed at least 2 times a year if you are meeting treatment goals.  It is performed 4 times a year if therapy has changed or if you are not meeting treatment goals. Blood pressure test  This test is performed at every routine medical visit. The goal is less than 140/90 mm Hg for most people, but 130/80 mm Hg in some cases. Ask your health care provider about your goal. Dental exam  Follow up with the dentist regularly. Eye exam  If you are diagnosed with type 1 diabetes as a child, get an exam upon reaching the age of 37 years or older and have had diabetes for 3-5 years. Yearly eye exams are recommended after that initial eye exam.  If you are diagnosed with type 1 diabetes as an adult, get an exam within 5 years of diagnosis and then yearly.  If you are diagnosed with type 2 diabetes, get an exam as soon as possible after the diagnosis and then yearly. Foot care exam  Visual foot exams are performed at every routine medical visit. The exams check for cuts, injuries, or other problems with the feet.  A comprehensive foot exam should be done yearly. This includes visual inspection as well as assessing foot pulses and testing for loss of sensation.  Check your feet nightly for cuts, injuries, or other problems with your feet. Tell your health care provider if anything is not healing. Kidney function test (urine  microalbumin)  This test is performed once a year.  Type 1 diabetes: The first test is performed 5 years after diagnosis.  Type 2 diabetes: The first test is performed at the time of diagnosis.  A serum creatinine and estimated glomerular filtration rate (eGFR) test is done once a year to assess the level of chronic kidney disease (CKD), if present. Lipid profile (cholesterol, HDL, LDL, triglycerides)  Performed every 5 years for most people.  The goal for LDL is less than 100 mg/dL. If you are at high risk, the goal is less than 70 mg/dL.  The goal for HDL is 40 mg/dL-50 mg/dL for men and 50 mg/dL-60 mg/dL for women. An HDL cholesterol of 60 mg/dL or higher gives some protection against heart disease.  The goal for triglycerides is less than 150 mg/dL. Influenza vaccine, pneumococcal vaccine, and hepatitis B vaccine  The influenza vaccine is recommended yearly.  It is recommended that people with diabetes who are over 24 years old get the pneumonia vaccine. In some cases, two separate shots may be given. Ask your health care provider if your pneumonia vaccination is up to date.  The hepatitis B vaccine is also recommended for adults with diabetes. Diabetes self-management education  Education is recommended at diagnosis and ongoing as needed. Treatment plan  Your treatment plan is reviewed at every medical visit. Document Released: 07/10/2009 Document Revised: 01/27/2014 Document Reviewed: 02/12/2013 [facility] Patient Information 2015 [place],  [employer]. This information is not intended to replace advice given to you by your health care provider. Make sure you discuss any questions you have with your health care provider.  

## 2015-04-08 ENCOUNTER — Other Ambulatory Visit: Payer: Self-pay | Admitting: *Deleted

## 2015-04-08 MED ORDER — NITROGLYCERIN 0.4 MG SL SUBL: 0.4000 mg | SUBLINGUAL_TABLET | SUBLINGUAL | Status: DC | PRN

## 2015-04-09 ENCOUNTER — Other Ambulatory Visit: Payer: Self-pay

## 2015-04-09 MED ORDER — ONETOUCH ULTRASOFT LANCETS MISC: Status: DC

## 2015-04-14 ENCOUNTER — Ambulatory Visit: Payer: Medicare Other

## 2015-04-14 ENCOUNTER — Encounter: Payer: Self-pay | Admitting: Family Medicine

## 2015-04-17 ENCOUNTER — Ambulatory Visit: Payer: Medicare Other | Admitting: Physician Assistant

## 2015-04-17 ENCOUNTER — Other Ambulatory Visit: Payer: Medicare Other

## 2015-04-19 NOTE — Progress Notes (Signed)
Cardiology Office Note   Date:  04/20/2015   ID:  Barry [name], DOB 02/04/1951, MRN [id number]  PCP:  Barry Blinks, MD  Cardiologist:  Dr. Loralie [name]     Chief Complaint  Patient presents with  . Coronary Artery Disease     History of Present Illness: Barry [name] is a 64 y.o. male with a hx of CAD, chronic superior mesenteric artery dissection, prior stroke, DM 2, HTN, CKD, HL. He originally presented in 02/2014 with non-STEMI. LHC demonstrated a ruptured plaque and thrombus in the proximal RCA which was treated with a DES. He was readmitted several weeks later with unstable angina and LHC demonstrated high-grade stenosis in the CFX which was treated with a DES.  I saw him 09/2014 and he complained of left-sided chest discomfort and dizziness.  Lexiscan Myoview was obtained and this was low risk with inferior lateral defect consistent with thinning versus small prior infarct. There was no ischemia. EF was 49%.   Last seen by Dr. Loralie [name] 5/16. He was off all his medications for unclear reasons.  These were resumed. He returns for FU.    He does not speak [place]. History is obtained with the help of an interpreter today. He denies chest pain but does have a hot feeling in his chest from time to time. This is a chronic complaint without change. He denies exertional symptoms. He will often take nitroglycerin with relief. He had these symptoms back when we did his stress test in February that was low risk. He denies significant dyspnea. He denies syncope. He denies orthopnea, PND or edema. He does complain of fatigue.   Studies/Reports Reviewed Today:  Myoview 11/18/14 Exercise Capacity: Lexiscan with no exercise. BP Response: Normal blood pressure response. Clinical Symptoms: No chest pain or dyspnea. ECG Impression: No significant ST segment change suggestive of ischemia. Comparison with Prior Nuclear Study: No previous nuclear study performed  Overall Impression: Low  risk stress nuclear study with a small, moderate intensity, fixed inferior lateral defect consistent with thinning vs small prior infarct; no ischemia. LV Ejection Fraction: 49%. LV Wall Motion: Mild global reduction in LV function.   Past Medical History  Diagnosis Date  . Hypertension   . Diabetes mellitus   . Stroke   . Iliac artery aneurysm, left   . Dissection of mesenteric artery     a. Mesenteric Artery Duplex (7/15):  pSMA chronic dissection with aneurysmal dilation of 1.29 cm ([facility]);  b.  Chest CTA (02/26/14):  IMPRESSION:  1. No aortic dissection or other acute abnormality.  2. 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  3. Stable 17 mm ectasia of the left common iliac artery.  4. Atherosclerosis, including aortoiliac and coronary artery disease.   Marland Kitchen CAD (coronary artery disease)     a. LHC (02/26/14):  mLAD 20 (faint L>R collats), mCFX 50, pRCA 95 (plaque rupture) >> PCI:  Xience DES to pRCA (severe tortuosity of R innominate artery >> needs L radial or FA in future);  b. LHC (03/10/14):  CFX 90, oOM 70, pRCA stent patent >> PCI:  Xience DES to mCFX   . Myocardial infarction   . Hx of cardiovascular stress test     Lexiscan Myoview (2/16):  Inferior lateral defect c/w thinning vs small prior infarct, no ischemia, EF 49%, Low Risk  . Hx of echocardiogram     a.  Echocardiogram (02/27/14):  Mod focal basal and mild concentric LVH, EF 50-55%, no RWMA, Gr 1 DD, mild TR,  normal RVF  . Carotid stenosis     a. Carotid US (02/27/14):  Bilateral ICA 1-39%  1. HTN 2. Type II diabetes 3. CVA in 2011 4. AAA: 2.2 cm in 3/15.  5. PAD: Left CIA with penetrating ulcer. 6. Chronic superior mesenteric artery dissection, followed by Dr Barry [name]. 7. CKD 8. CAD: NSTEMI 6/15 with 95% proximal RCA stenosis treated with DES. Unstable angina later in 6/15, this time had DES to 90% mLCx. Echo (6/15) with EF 50-55%, moderate focal basal septal hypertrophy, normal RV size and systolic function.  Lexiscan Cardiolite (2/16) with EF 49%, small fixed inferolateral defect with no ischemia.  9. Carotid dopplers (6/15) with mild stenosis.  10. Hyperlipidemia: Did not tolerate atorvastatin.   Past Surgical History  Procedure Laterality Date  . Admission  09/26/2009    CVA.  Barry [name].  . Cardiac catheterization    . Coronary angioplasty    . Left heart catheterization with coronary angiogram N/A 02/26/2014    Procedure: LEFT HEART CATHETERIZATION WITH CORONARY ANGIOGRAM;  Surgeon: Barry Hampshire, MD;  Location: [facility] CATH [facility];  Service: Cardiovascular;  Laterality: N/A;  . Left heart catheterization with coronary angiogram N/A 03/10/2014    Procedure: LEFT HEART CATHETERIZATION WITH CORONARY ANGIOGRAM;  Surgeon: Barry Booze, MD;  Location: [facility] CATH LAB;  Service: Cardiovascular;  Laterality: N/A;     Current Outpatient Prescriptions  Medication Sig Dispense Refill  . aspirin 81 MG EC tablet Take 1 tablet (81 mg total) by mouth daily.    . Blood Glucose Monitoring Suppl (ONE TOUCH ULTRA SYSTEM KIT) W/DEVICE KIT 1 kit by Does not apply route once. One touch delica lancets, and meter Pt will test once daily. Dx 250.92 1 each 0  . carvedilol (COREG) 6.25 MG tablet Take 1 tablet (6.25 mg total) by mouth 2 (two) times daily with a meal. 60 tablet 3  . clopidogrel (PLAVIX) 75 MG tablet Take 1 tablet (75 mg total) by mouth daily with breakfast. 90 tablet 1  . diphenhydrAMINE (BENADRYL) 25 MG tablet Take 50 mg by mouth daily as needed for allergies.    Marland Kitchen docusate sodium (COLACE) 100 MG capsule Take 1 capsule (100 mg total) by mouth daily. For constipation (Patient taking differently: Take 100 mg by mouth as needed. For constipation) 90 capsule 1  . fluorometholone (FML) 0.1 % ophthalmic suspension Place 1 drop into both eyes 2 (two) times daily.    Marland Kitchen gabapentin (NEURONTIN) 300 MG capsule Take 1 capsule (300 mg total) by mouth 3 (three) times daily. And 2 tabs as needed at night for leg pain  90 capsule 4  . glucose blood test strip Test blood sugar daily. Dx code: 250.92. 100 each 0  . Insulin Glargine (LANTUS SOLOSTAR) 100 UNIT/ML Solostar Pen Inject 10 Units into the skin daily. 5 pen PRN  . Insulin Pen Needle (NOVOTWIST) 32G X 5 MM MISC Use 2 per day to inject lantus and victoza 90 each 3  . Lancets (ONETOUCH ULTRASOFT) lancets Test blood sugar 3 times daily. Dx code: E11.8 100 each 12  . Liraglutide 18 MG/3ML SOPN Inject 0.31m daily for 1 week & then increase to 1.254mdaily. 6 mL 3  . lisinopril (PRINIVIL,ZESTRIL) 40 MG tablet Take 1 tablet (40 mg total) by mouth daily. 90 tablet 1  . nitroGLYCERIN (NITROSTAT) 0.4 MG SL tablet Place 1 tablet (0.4 mg total) under the tongue every 5 (five) minutes as needed for chest pain. 25 tablet 1  . nortriptyline (PAMELOR)  25 MG capsule Take 1 capsule (25 mg total) by mouth at bedtime. 30 capsule 3  . polyethylene glycol powder (GLYCOLAX/MIRALAX) powder Take 17 g by mouth 2 (two) times daily as needed for mild constipation. 500 g 11  . ranitidine (ZANTAC) 150 MG tablet Take 1 tablet (150 mg total) by mouth 2 (two) times daily. For abdominal pain 60 tablet 5  . rosuvastatin (CRESTOR) 40 MG tablet Take 1 tablet (40 mg total) by mouth daily. 90 tablet 0  . terbinafine (LAMISIL) 1 % cream Apply 1 application topically 2 (two) times daily. To feet for a month 42 g 3   No current facility-administered medications for this visit.    Allergies:   Review of patient's allergies indicates not on file.    Social History:  The patient  reports that he quit smoking about 5 years ago. He has never used smokeless tobacco. He reports that he does not drink alcohol or use illicit drugs.   Family History:  The patient's family history includes Diabetes in his father; Heart attack in his father; Hypertension in his father. There is no history of Stroke.    ROS:   Please see the history of present illness.   Review of Systems  Constitution: Negative for  chills and fever.  Respiratory: Negative for cough.   Musculoskeletal: Positive for back pain.  Gastrointestinal: Negative for diarrhea, hematochezia, melena and vomiting.  Genitourinary: Negative for hematuria.  All other systems reviewed and are negative.     PHYSICAL EXAM: VS:  BP 136/86 mmHg  Pulse 97  Ht 5' 5"  (1.651 m)  Wt 166 lb (75.297 kg)  BMI 27.62 kg/m2  SpO2 98%    Wt Readings from Last 3 Encounters:  04/20/15 166 lb (75.297 kg)  04/03/15 164 lb 12.8 oz (74.753 kg)  02/12/15 169 lb (76.658 kg)     GEN: Well nourished, well developed, in no acute distress HEENT: normal Neck: no JVD, no carotid bruits, no masses Cardiac:  Normal S1/S2, RRR; no murmur ,  no rubs or gallops, no edema   Respiratory:  clear to auscultation bilaterally, no wheezing, rhonchi or rales. GI: soft, nontender, nondistended, + BS MS: no deformity or atrophy Skin: warm and dry  Neuro:  CNs II-XII intact, Strength and sensation are intact Psych: Normal affect   EKG:  EKG is not ordered today.  It demonstrates:   n/a   Recent Labs: 01/12/2015: BUN 14; Creatinine, Ser 1.10; Hemoglobin 17.3*; Platelets 181; Potassium 4.4; Sodium 139 02/12/2015: ALT 23    Lipid Panel    Component Value Date/Time   CHOL 215* 12/18/2014 0834   TRIG 186.0* 12/18/2014 0834   HDL 34.90* 12/18/2014 0834   CHOLHDL 6 12/18/2014 0834   VLDL 37.2 12/18/2014 0834   LDLCALC 143* 12/18/2014 0834   LDLDIRECT 87.7 04/28/2014 0838      ASSESSMENT AND PLAN:    1. CAD: He is s/p PCI to LCx and RCA. EF low normal on echo and mildly decreased on recent Myoview. He is on ASA 81, Plavix 75, carvedilol, ACEI, statin. Recent Myoview was low risk.He has atypical chest symptoms without change. Continue current Rx.   2.  PAD: Penetrating ulcer left CIA and chronic SMA dissection. Last CTA abdomen did not show any changes in the appearance of the SMA. Patient has followup with vascular surgery next month.  3.  Smoking: He is staying off cigarettes.   4. Hyperlipidemia: Continue statin.  Follow-up lipids and LFTs obtained today.  5. HTN: Fair control. Continue current therapy. BMET was already obtained today.  6. AAA: Will be followed by [facility].  7.  Back pain: He does have some radicular symptoms. I have asked him to follow-up with primary care.   Medication Changes: Current medicines are reviewed at length with the patient today.  Concerns regarding medicines are as outlined above.  The following changes have been made:   Discontinued Medications   No medications on file   Modified Medications   No medications on file   New Prescriptions   No medications on file     Labs/ tests ordered today include:   No orders of the defined types were placed in this encounter.     Disposition:   FU with me in 4 months.   Signed, Versie Starks, MHS 04/20/2015 11:00 AM    [facility] [place], [place], [place]  [address] Phone: [phone]; Fax: [phone]

## 2015-04-20 ENCOUNTER — Encounter: Payer: Self-pay | Admitting: Physician Assistant

## 2015-04-20 ENCOUNTER — Other Ambulatory Visit (INDEPENDENT_AMBULATORY_CARE_PROVIDER_SITE_OTHER): Payer: Medicare Other | Admitting: *Deleted

## 2015-04-20 ENCOUNTER — Ambulatory Visit (INDEPENDENT_AMBULATORY_CARE_PROVIDER_SITE_OTHER): Payer: Medicare Other | Admitting: Physician Assistant

## 2015-04-20 ENCOUNTER — Telehealth: Payer: Self-pay | Admitting: *Deleted

## 2015-04-20 VITALS — BP 136/86 | HR 97 | Ht 65.0 in | Wt 166.0 lb

## 2015-04-20 DIAGNOSIS — I739 Peripheral vascular disease, unspecified: Secondary | ICD-10-CM

## 2015-04-20 DIAGNOSIS — E785 Hyperlipidemia, unspecified: Secondary | ICD-10-CM

## 2015-04-20 DIAGNOSIS — I2510 Atherosclerotic heart disease of native coronary artery without angina pectoris: Secondary | ICD-10-CM

## 2015-04-20 DIAGNOSIS — I10 Essential (primary) hypertension: Secondary | ICD-10-CM

## 2015-04-20 DIAGNOSIS — Z720 Tobacco use: Secondary | ICD-10-CM

## 2015-04-20 DIAGNOSIS — I714 Abdominal aortic aneurysm, without rupture, unspecified: Secondary | ICD-10-CM

## 2015-04-20 DIAGNOSIS — L98499 Non-pressure chronic ulcer of skin of other sites with unspecified severity: Secondary | ICD-10-CM

## 2015-04-20 LAB — BASIC METABOLIC PANEL
BUN: 18 mg/dL (ref 6–23)
CO2: 29 mEq/L (ref 19–32)
Calcium: 9.4 mg/dL (ref 8.4–10.5)
Chloride: 103 mEq/L (ref 96–112)
Creatinine, Ser: 1.56 mg/dL — ABNORMAL HIGH (ref 0.40–1.50)
GFR: 47.68 mL/min — ABNORMAL LOW (ref >60.00)
Glucose, Bld: 223 mg/dL — ABNORMAL HIGH (ref 70–99)
Potassium: 4.1 mEq/L (ref 3.5–5.1)
Sodium: 139 mEq/L (ref 135–145)

## 2015-04-20 LAB — LIPID PANEL
Cholesterol: 157 mg/dL (ref 0–200)
HDL: 30.3 mg/dL — ABNORMAL LOW (ref >39.00)
NonHDL: 126.7
Total CHOL/HDL Ratio: 5
Triglycerides: 207 mg/dL — ABNORMAL HIGH (ref 0.0–149.0)
VLDL: 41.4 mg/dL — ABNORMAL HIGH (ref 0.0–40.0)

## 2015-04-20 LAB — HEPATIC FUNCTION PANEL
ALT: 21 U/L (ref 0–53)
AST: 20 U/L (ref 0–37)
Albumin: 4.1 g/dL (ref 3.5–5.2)
Alkaline Phosphatase: 67 U/L (ref 39–117)
Bilirubin, Direct: 0.1 mg/dL (ref 0.0–0.3)
Total Bilirubin: 0.5 mg/dL (ref 0.2–1.2)
Total Protein: 6.9 g/dL (ref 6.0–8.3)

## 2015-04-20 LAB — LDL CHOLESTEROL, DIRECT: Direct LDL: 101 mg/dL

## 2015-04-20 MED ORDER — AMLODIPINE BESYLATE 5 MG PO TABS: 5.0000 mg | ORAL_TABLET | Freq: Every day | ORAL | Status: DC

## 2015-04-20 MED ORDER — LISINOPRIL 20 MG PO TABS: 20.0000 mg | ORAL_TABLET | Freq: Every day | ORAL | Status: DC

## 2015-04-20 NOTE — Telephone Encounter (Signed)
Daughter Esa notified of lab results and med changes. BMET 8/5; Scott W.PA 9/26; Esa verbalized understanding to plan of care; Esa advised to make sure pt is taking crestor every day;

## 2015-04-20 NOTE — Patient Instructions (Signed)
Medication Instructions:  No changes  Labwork: Already drawn today.  Labs drawn are checking kidney function, liver function and cholesterol.  Testing/Procedures: None today  Follow-Up: Schedule follow up appointment with Richardson Dopp, PA-C 4 months.  Any Other Special Instructions Will Be Listed Below (If Applicable).

## 2015-04-20 NOTE — Addendum Note (Signed)
Addended by: Eulis Foster on: 04/20/2015 09:51 AM   Modules accepted: Orders

## 2015-04-28 ENCOUNTER — Telehealth: Payer: Self-pay | Admitting: *Deleted

## 2015-04-28 NOTE — Telephone Encounter (Signed)
Will review on visit

## 2015-04-28 NOTE — Telephone Encounter (Signed)
This patients insurance company called because Lantus is not on his formulary, the alternatives are Levemir and Tyler Aas, he has made an appt for 05/15/2015. I let her know that there would be nothing sent in until he's came back for an appointment.

## 2015-05-01 ENCOUNTER — Encounter: Payer: Self-pay | Admitting: Family

## 2015-05-01 ENCOUNTER — Other Ambulatory Visit: Payer: Self-pay | Admitting: Surgery

## 2015-05-01 DIAGNOSIS — I7779 Dissection of other artery: Secondary | ICD-10-CM

## 2015-05-04 ENCOUNTER — Telehealth: Payer: Self-pay | Admitting: Physician Assistant

## 2015-05-04 ENCOUNTER — Ambulatory Visit (HOSPITAL_COMMUNITY)
Admission: RE | Admit: 2015-05-04 | Discharge: 2015-05-04 | Disposition: A | Discharge status: Home / Self Care | Payer: Medicare Other | Source: Ambulatory Visit | Attending: Family | Admitting: Family

## 2015-05-04 ENCOUNTER — Ambulatory Visit (INDEPENDENT_AMBULATORY_CARE_PROVIDER_SITE_OTHER): Payer: Medicare Other | Admitting: Family

## 2015-05-04 ENCOUNTER — Ambulatory Visit: Payer: Medicare Other

## 2015-05-04 ENCOUNTER — Ambulatory Visit: Payer: Medicare Other | Admitting: Endocrinology

## 2015-05-04 ENCOUNTER — Encounter: Payer: Self-pay | Admitting: Family

## 2015-05-04 VITALS — BP 162/101 | HR 67 | Temp 97.7°F | Resp 16 | Ht 65.0 in | Wt 164.0 lb

## 2015-05-04 DIAGNOSIS — R030 Elevated blood-pressure reading, without diagnosis of hypertension: Secondary | ICD-10-CM | POA: Diagnosis not present

## 2015-05-04 DIAGNOSIS — M5416 Radiculopathy, lumbar region: Secondary | ICD-10-CM

## 2015-05-04 DIAGNOSIS — I7779 Dissection of other artery: Secondary | ICD-10-CM

## 2015-05-04 DIAGNOSIS — I739 Peripheral vascular disease, unspecified: Secondary | ICD-10-CM | POA: Diagnosis not present

## 2015-05-04 DIAGNOSIS — K551 Chronic vascular disorders of intestine: Secondary | ICD-10-CM | POA: Diagnosis not present

## 2015-05-04 DIAGNOSIS — Z789 Other specified health status: Secondary | ICD-10-CM

## 2015-05-04 DIAGNOSIS — K625 Hemorrhage of anus and rectum: Secondary | ICD-10-CM

## 2015-05-04 DIAGNOSIS — IMO0001 Reserved for inherently not codable concepts without codable children: Secondary | ICD-10-CM

## 2015-05-04 DIAGNOSIS — L98499 Non-pressure chronic ulcer of skin of other sites with unspecified severity: Secondary | ICD-10-CM | POA: Diagnosis not present

## 2015-05-04 NOTE — Telephone Encounter (Signed)
Called pt daughter no VM setup will keep trying

## 2015-05-04 NOTE — Telephone Encounter (Signed)
New message     Pt has blood in his stool.  Could it be from the new medication?   Daughter does not know name of medication.

## 2015-05-04 NOTE — Telephone Encounter (Signed)
Spoke with pt daughter Kirby Funk, due to language barrier, stating that pt has been having blood in his stool over the past three days ans since Saturday 8/6 pt has stopped taking all medications.  Pt is not sure what is causing bleeding and is afraid to take any medication.  Esa going to call pt to find out details as she is not aware of how much blood is in stool, how often pt has blood in stools, and how pt is feeling since it has started. Pt has not been checking BP but she is going to check it out.  Waiting on call back from Esa to find out details of of pts bloody stools.  Review pt concerns with PA, Kathlen Mody. If pt having copious amounts of blood he needs to go to the emergency room.  If pt having minimal streaking in stools pt needs to schedule appt with PCP in next day or 2 and can hold Plavix until that appt., as pt is over a year from stent placement.

## 2015-05-04 NOTE — Progress Notes (Signed)
Filed Vitals:   05/04/15 0951 05/04/15 1003  BP: 176/99 162/101  Pulse: 70 67  Temp: 97.7 F (36.5 C)   TempSrc: Oral   Resp: 16   Height: 5\' 5"  (1.651 m)   Weight: 164 lb (74.39 kg)   SpO2: 99%

## 2015-05-04 NOTE — Telephone Encounter (Signed)
Pt told daughter that he has been bleeding with stools for the past week and has not taken his medications in a week. Pt stated that he is dripping bright red blood from his rectum.  Pt has no c/o of SOD, dizziness, or lethargy at this point.  Pt instructed to go to the emergency room to be evaluated.  Educated her that if he is bleeding that much that he needs to be evaluated there is nothing we can do if he is actively bleeding and can best be cared for in the ED. Pt daughter expressed understanding and she would take her father to the ED today.

## 2015-05-04 NOTE — Progress Notes (Signed)
Established Chronic Mesenteric Artery Dissection  History of Present Illness  Barry [name] is a 64 y.o. (1951-04-17) male patient of Dr. Trula Slade who presented to the emergency department in February, 2014 with abdominal pain. He underwent a CT scan which showed a superior mesenteric artery dissection. He was diagnosed as having epiploic appendagitis involving the sigmoid colon. He was treated with pain medications and discharged. At that time Dr. Trula Slade requested that the patietn followup with him with a repeat scan with better arterial phase timing. He reports since his emergency department visit that his abdominal pain has nearly gone away. He does describe having occasional pain. This is not associated with eating. He can occur at any time. There are no aggravating or relieving factors. He denies having any weight loss. He denies blood in his stool. He has not had diarrhea. He returns today for Duplex imaging of AAA and mesenteric arteries.  He has a Tour manager with him.  Chronic superior mesenteric artery dissection  Left common iliac artery penetrating ulcer   On 12/03/2012 visit Dr. Trula Slade felt that the patient's superior mesenteric artery dissection was a chronic finding. He did not feel that pt is symptomatic from this. He told the patient that this would be something that needs to be followed. The biggest concern is aneurysmal degeneration over time. For that reason pt return in one year with a repeat ultrasound; will also need to have his left common iliac artery evaluate for aneurysmal changes as well.  The patient has had some numbness on his left side. With his history of stroke, it was felt that a carotid Doppler study was needed. This shows normal carotid arteries bilaterally. His numbness symptoms were potentially related to hypertension. Again we have beein trying to find a primary care physician to manage his chronic medical problems.  It appears that his PCP is an  urgent care center.  He had a stroke in 2011, he has residual weakness in left and and leg.  He has had low back pain for about 5 years with bilateral radiculopathy, left is worse. He is not on an antiplatelet medication nor a statin.  Dr. Trula Slade last saw pt  April 28, 2014. At that time he discussed with the patient via the interpreter that he does not feel his abdominal pain was related to his mesenteric dissection. Most likely there is something else going on. When Dr. Trula Slade looked back to the records he could not find where he had a formal GI consult. Dr. Trula Slade thought this was necessary given his history and he gave pt a GI referral at that time. Dr. Trula Slade did not recommend any intervention on the superior mesenteric artery at that time.Pt was scheduled for followup in one year with an ultrasound to await the size of any associated aneurysmal degeneration. Pt returns today for this one year follow up.  His weight is up 2 pounds from his visit to our office a year ago.  Pt states stomach feels tight, worse after eating. He has low back pain with bilateral radiculopathy symptoms that is worsening; was seen at an urgent care in [facility] July 2016 for his DM; he states he was given a pain medication that is not on his current list, states he is constipated.  Apparently his PCP is in an an urgent care PCP.    Pt states he has had bright red blood in his stool this week; I advised him to see his PCP today re this.  Past Medical History  Diagnosis Date  . Hypertension   . Diabetes mellitus   . Stroke   . Iliac artery aneurysm, left   . Dissection of mesenteric artery     a. Mesenteric Artery Duplex (7/15):  pSMA chronic dissection with aneurysmal dilation of 1.29 cm (VVS);  b.  Chest CTA (02/26/14):  IMPRESSION:  1. No aortic dissection or other acute abnormality.  2. 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  3. Stable 17 mm ectasia of the left common iliac artery.   4. Atherosclerosis, including aortoiliac and coronary artery disease.   Marland Kitchen CAD (coronary artery disease)     a. LHC (02/26/14):  mLAD 20 (faint L>R collats), mCFX 50, pRCA 95 (plaque rupture) >> PCI:  Xience DES to pRCA (severe tortuosity of R innominate artery >> needs L radial or FA in future);  b. LHC (03/10/14):  CFX 90, oOM 70, pRCA stent patent >> PCI:  Xience DES to mCFX   . Myocardial infarction   . Hx of cardiovascular stress test     Lexiscan Myoview (2/16):  Inferior lateral defect c/w thinning vs small prior infarct, no ischemia, EF 49%, Low Risk  . Hx of echocardiogram     a.  Echocardiogram (02/27/14):  Mod focal basal and mild concentric LVH, EF 50-55%, no RWMA, Gr 1 DD, mild TR, normal RVF  . Carotid stenosis     a. Carotid US (02/27/14):  Bilateral ICA 1-39%    Social History History  Substance Use Topics  . Smoking status: Former Smoker    Quit date: 06/09/2009  . Smokeless tobacco: Never Used  . Alcohol Use: No    Family History Family History  Problem Relation Age of Onset  . Diabetes Father   . Hypertension Father   . Heart attack Father   . Stroke Neg Hx     Surgical History Past Surgical History  Procedure Laterality Date  . Admission  09/26/2009    CVA.  [name].  . Cardiac catheterization    . Coronary angioplasty    . Left heart catheterization with coronary angiogram N/A 02/26/2014    Procedure: LEFT HEART CATHETERIZATION WITH CORONARY ANGIOGRAM;  Surgeon: Wellington Hampshire, MD;  Location: [facility] CATH LAB;  Service: Cardiovascular;  Laterality: N/A;  . Left heart catheterization with coronary angiogram N/A 03/10/2014    Procedure: LEFT HEART CATHETERIZATION WITH CORONARY ANGIOGRAM;  Surgeon: Jettie Booze, MD;  Location: [facility] CATH LAB;  Service: Cardiovascular;  Laterality: N/A;    Not on File  Current Outpatient Prescriptions  Medication Sig Dispense Refill  . amLODipine (NORVASC) 5 MG tablet Take 1 tablet (5 mg total) by mouth daily. 30 tablet 11  .  aspirin 81 MG EC tablet Take 1 tablet (81 mg total) by mouth daily.    . Blood Glucose Monitoring Suppl (ONE TOUCH ULTRA SYSTEM KIT) W/DEVICE KIT 1 kit by Does not apply route once. One touch delica lancets, and meter Pt will test once daily. Dx 250.92 1 each 0  . carvedilol (COREG) 6.25 MG tablet Take 1 tablet (6.25 mg total) by mouth 2 (two) times daily with a meal. 60 tablet 3  . clopidogrel (PLAVIX) 75 MG tablet Take 1 tablet (75 mg total) by mouth daily with breakfast. 90 tablet 1  . diphenhydrAMINE (BENADRYL) 25 MG tablet Take 50 mg by mouth daily as needed for allergies.    Marland Kitchen docusate sodium (COLACE) 100 MG capsule Take 1 capsule (100 mg total) by mouth daily.  For constipation (Patient taking differently: Take 100 mg by mouth as needed. For constipation) 90 capsule 1  . fluorometholone (FML) 0.1 % ophthalmic suspension Place 1 drop into both eyes 2 (two) times daily.    Marland Kitchen gabapentin (NEURONTIN) 300 MG capsule Take 1 capsule (300 mg total) by mouth 3 (three) times daily. And 2 tabs as needed at night for leg pain 90 capsule 4  . glucose blood test strip Test blood sugar daily. Dx code: 250.92. 100 each 0  . Insulin Glargine (LANTUS SOLOSTAR) 100 UNIT/ML Solostar Pen Inject 10 Units into the skin daily. 5 pen PRN  . Insulin Pen Needle (NOVOTWIST) 32G X 5 MM MISC Use 2 per day to inject lantus and victoza 90 each 3  . Lancets (ONETOUCH ULTRASOFT) lancets Test blood sugar 3 times daily. Dx code: E11.8 100 each 12  . Liraglutide 18 MG/3ML SOPN Inject 0.28m daily for 1 week & then increase to 1.233mdaily. 6 mL 3  . lisinopril (PRINIVIL,ZESTRIL) 20 MG tablet Take 1 tablet (20 mg total) by mouth daily. 30 tablet 11  . nitroGLYCERIN (NITROSTAT) 0.4 MG SL tablet Place 1 tablet (0.4 mg total) under the tongue every 5 (five) minutes as needed for chest pain. 25 tablet 1  . nortriptyline (PAMELOR) 25 MG capsule Take 1 capsule (25 mg total) by mouth at bedtime. 30 capsule 3  . polyethylene glycol powder  (GLYCOLAX/MIRALAX) powder Take 17 g by mouth 2 (two) times daily as needed for mild constipation. 500 g 11  . ranitidine (ZANTAC) 150 MG tablet Take 1 tablet (150 mg total) by mouth 2 (two) times daily. For abdominal pain 60 tablet 5  . rosuvastatin (CRESTOR) 40 MG tablet Take 1 tablet (40 mg total) by mouth daily. 90 tablet 0  . terbinafine (LAMISIL) 1 % cream Apply 1 application topically 2 (two) times daily. To feet for a month 42 g 3   No current facility-administered medications for this visit.    ROS: see HPI for pertinent positives and negatives    Physical Examination  Filed Vitals:   05/04/15 0951  BP: 176/99  Pulse: 70  Temp: 97.7 F (36.5 C)  TempSrc: Oral  Resp: 16  Height: 5' 5"  (1.651 m)  Weight: 164 lb (74.39 kg)  SpO2: 99%   Body mass index is 27.29 kg/(m^2).  General: A&O x 3, WDWN  Pulmonary: Sym exp, good air movt, CTAB, no rales, rhonchi, or wheezing.  Cardiac: RRR, Nl S1, S2, no Murmur detected.  Vascular: Vessel Right Left  Radial Palpable Palpable  Carotid without bruit without bruit  Aorta Not palpable N/A  Femoral Palpable Palpable  Popliteal Not palpable Not palpable  PT Palpable Palpable  DP Palpable Palpable   Gastrointestinal: soft, - palpable masses, no abdominal tenderness on palpation.  Musculoskeletal: M/S 5/5 in right arm and leg, 4/5 in left arm and leg, Extremities without ischemic changes.  Neurologic: Pain and light touch intact in extremities, Motor exam as listed above        Non-Invasive Vascular Imaging  Mesenteric Duplex (Date: 05/04/2015):  MESENTERIC ARTERY DUPLEX EVALUATION    INDICATION: Follow-up SMA dissection    PREVIOUS INTERVENTION(S):     DUPLEX EXAM: Exam was performed with a [place] interpreter present. [employer] ShipperLanguage Resources    ARTERY PEAK SYSTOLIC VELOCITY (cm/s) IMAGE  Aorta 85   Celiac 338   Superior Mesenteric Artery - Proximal 684 1.35cm  diameter Dissection present  Superior Mesenteric Artery - Mid 101  Inferior Mesenteric Artery 401   Hepatic  Patent  Splenic  NV     ADDITIONAL FINDINGS: Patient was not NPO due to language barrier for prep instructions.    IMPRESSION: 1. Superior mesenteric artery dissection is observed with stenosis of >70%% and aneurysmal dilation measuring 1.35cm. Large collateral branches are observed. 2. Increased velocities of the celiac axis are observed suggestive of >70%%; origin could not be visualized well enough to evaluate in 2D.     Medical Decision Making  Barry [name] is a 64 y.o. male who presents with chronic superior mesenteric artery dissection and left common iliac artery penetrating ulcer. He no longer has abdominal pain on palpation. Today's mesenteric artery Duplex suggest superior mesenteric artery dissection is observed with stenosis of >70%% and aneurysmal dilation measuring 1.35cm. Large collateral branches are observed. Increased velocities of the celiac axis are observed suggestive of >70%%; origin could not be visualized well enough to evaluate in 2D. On 04/07/14 mesenteric Duplex the proximal SMA velocity was 207 cm/s; today the proximal SMA velocity is 684 cm/s.  His low back and bilateral legs pain are c/w radiculopathy and apparently has not had this evaluated; pt requested referral to spine specialist and this was given. Pt states he has had bright red blood in his stool this week; I advised him to see his PCP today re this and his elevated blood pressure.  Face to face time with patient was 25 minutes. Over 50% of this time was spent on counseling and coordination of care.    Based on her exam and studies, and after discussing with Dr. Kellie Simmering, pt will be scheduled to see Dr. Trula Slade in 2-3 weeks and discuss options for further evaluation of today's Duplex results.   I discussed in depth with the patient the nature of atherosclerosis, and emphasized the importance of  maximal medical management including strict control of blood pressure, blood glucose, and lipid levels, obtaining regular exercise, and cessation of smoking.    The patient is aware that without maximal medical management the underlying atherosclerotic disease process will progress, limiting the benefit of any interventions. Through the translator he indicates that he stopped taking all of his medications when he noted bright red blood in his stool.   Thank you for allowing [place] to participate in this patient's care.  Clemon Chambers, RN, MSN, FNP-C [facility] Office: [phone]  Clinic MD: Kellie Simmering  05/04/2015, 9:56 AM

## 2015-05-04 NOTE — Telephone Encounter (Signed)
Pt's dtr now with the patient and was to call back to give more info-pls call [phone] [place]

## 2015-05-05 ENCOUNTER — Other Ambulatory Visit: Payer: Self-pay

## 2015-05-05 NOTE — Patient Outreach (Signed)
[facility]) Care Management  05/05/2015  Tip Mcgruder [birth year] [id number]   Assessment: Call to Primary care office and spoke with receptionist who confirms they have the same contact information that care coordinator has.  Plan: send outreach letter.  Thea Silversmith, RN, MSN, [place] Coordinator Cell: [phone]

## 2015-05-05 NOTE — Patient Outreach (Signed)
[facility]) Care Management  05/05/2015  Barry [name] 04/29/1951 [id number]  Assessment: Referral from high risk list. Call to assess care management needs. Care Coordinator called through interpreting services-[employer] Interpreter Identification [id number] number was changed, disconnected or is no longer in service. Care Coordinator also dialed the number and received the same message.  Plan: Call primary care office-[facility] regarding additional contact number.  Thea Silversmith, RN, MSN, [place] Coordinator Cell: [phone]

## 2015-05-11 ENCOUNTER — Ambulatory Visit: Payer: Medicare Other | Admitting: Endocrinology

## 2015-05-15 ENCOUNTER — Encounter: Payer: Self-pay | Admitting: Surgery

## 2015-05-15 ENCOUNTER — Ambulatory Visit: Payer: Medicare Other | Admitting: Endocrinology

## 2015-05-18 ENCOUNTER — Ambulatory Visit (INDEPENDENT_AMBULATORY_CARE_PROVIDER_SITE_OTHER): Payer: Medicare Other | Admitting: Surgery

## 2015-05-18 ENCOUNTER — Encounter (HOSPITAL_COMMUNITY): Payer: Self-pay | Admitting: *Deleted

## 2015-05-18 ENCOUNTER — Emergency Department (HOSPITAL_COMMUNITY)
Admission: EM | Admit: 2015-05-18 | Discharge: 2015-05-18 | Disposition: A | Discharge status: Home / Self Care | Payer: Medicare Other | Attending: Emergency Medicine | Admitting: Emergency Medicine

## 2015-05-18 ENCOUNTER — Ambulatory Visit: Payer: Medicare Other | Admitting: Endocrinology

## 2015-05-18 ENCOUNTER — Encounter: Payer: Self-pay | Admitting: Surgery

## 2015-05-18 ENCOUNTER — Telehealth: Payer: Self-pay | Admitting: Endocrinology

## 2015-05-18 ENCOUNTER — Telehealth: Payer: Self-pay | Admitting: Surgery

## 2015-05-18 VITALS — BP 146/89 | HR 70 | Temp 98.2°F | Resp 16 | Ht 66.93 in | Wt 164.0 lb

## 2015-05-18 DIAGNOSIS — Z7982 Long term (current) use of aspirin: Secondary | ICD-10-CM | POA: Insufficient documentation

## 2015-05-18 DIAGNOSIS — Z794 Long term (current) use of insulin: Secondary | ICD-10-CM | POA: Insufficient documentation

## 2015-05-18 DIAGNOSIS — K922 Gastrointestinal hemorrhage, unspecified: Secondary | ICD-10-CM | POA: Insufficient documentation

## 2015-05-18 DIAGNOSIS — I2510 Atherosclerotic heart disease of native coronary artery without angina pectoris: Secondary | ICD-10-CM | POA: Insufficient documentation

## 2015-05-18 DIAGNOSIS — I252 Old myocardial infarction: Secondary | ICD-10-CM | POA: Diagnosis not present

## 2015-05-18 DIAGNOSIS — Z8673 Personal history of transient ischemic attack (TIA), and cerebral infarction without residual deficits: Secondary | ICD-10-CM | POA: Diagnosis not present

## 2015-05-18 DIAGNOSIS — E785 Hyperlipidemia, unspecified: Secondary | ICD-10-CM | POA: Insufficient documentation

## 2015-05-18 DIAGNOSIS — I10 Essential (primary) hypertension: Secondary | ICD-10-CM | POA: Diagnosis not present

## 2015-05-18 DIAGNOSIS — I739 Peripheral vascular disease, unspecified: Secondary | ICD-10-CM | POA: Diagnosis not present

## 2015-05-18 DIAGNOSIS — L98499 Non-pressure chronic ulcer of skin of other sites with unspecified severity: Secondary | ICD-10-CM

## 2015-05-18 DIAGNOSIS — Z87891 Personal history of nicotine dependence: Secondary | ICD-10-CM | POA: Insufficient documentation

## 2015-05-18 DIAGNOSIS — I7779 Dissection of other artery: Secondary | ICD-10-CM | POA: Diagnosis not present

## 2015-05-18 DIAGNOSIS — R195 Other fecal abnormalities: Secondary | ICD-10-CM

## 2015-05-18 DIAGNOSIS — E119 Type 2 diabetes mellitus without complications: Secondary | ICD-10-CM | POA: Diagnosis not present

## 2015-05-18 DIAGNOSIS — K625 Hemorrhage of anus and rectum: Secondary | ICD-10-CM | POA: Diagnosis present

## 2015-05-18 LAB — BASIC METABOLIC PANEL
Anion gap: 6 (ref 5–15)
BUN: 17 mg/dL (ref 6–20)
CO2: 26 mmol/L (ref 22–32)
Calcium: 9.2 mg/dL (ref 8.9–10.3)
Chloride: 106 mmol/L (ref 101–111)
Creatinine, Ser: 1.06 mg/dL (ref 0.61–1.24)
GFR calc Af Amer: >60 mL/min (ref >60)
GFR calc non Af Amer: >60 mL/min (ref >60)
Glucose, Bld: 94 mg/dL (ref 65–99)
Potassium: 3.8 mmol/L (ref 3.5–5.1)
Sodium: 138 mmol/L (ref 135–145)

## 2015-05-18 LAB — CBC WITH DIFFERENTIAL/PLATELET
Basophils Absolute: 0 10*3/uL (ref 0.0–0.1)
Basophils Relative: 0 % (ref 0–1)
Eosinophils Absolute: 0.1 10*3/uL (ref 0.0–0.7)
Eosinophils Relative: 2 % (ref 0–5)
HCT: 44.3 % (ref 39.0–52.0)
Hemoglobin: 14.3 g/dL (ref 13.0–17.0)
Lymphocytes Relative: 23 % (ref 12–46)
Lymphs Abs: 1.6 10*3/uL (ref 0.7–4.0)
MCH: 28.4 pg (ref 26.0–34.0)
MCHC: 32.3 g/dL (ref 30.0–36.0)
MCV: 87.9 fL (ref 78.0–100.0)
Monocytes Absolute: 0.3 10*3/uL (ref 0.1–1.0)
Monocytes Relative: 4 % (ref 3–12)
Neutro Abs: 4.9 10*3/uL (ref 1.7–7.7)
Neutrophils Relative %: 71 % (ref 43–77)
Platelets: 184 10*3/uL (ref 150–400)
RBC: 5.04 MIL/uL (ref 4.22–5.81)
RDW: 13.6 % (ref 11.5–15.5)
WBC: 7 10*3/uL (ref 4.0–10.5)

## 2015-05-18 LAB — I-STAT CG4 LACTIC ACID, ED: Lactic Acid, Venous: 1.1 mmol/L (ref 0.5–2.0)

## 2015-05-18 LAB — POC OCCULT BLOOD, ED: Fecal Occult Bld: POSITIVE — AB

## 2015-05-18 NOTE — ED Notes (Signed)
Pt sent from vein specialist, pt does not speak [place], speak [place]. Pt through interpretor reports bright red blood in stool 1/2 cup at a time x2 weeks. Denies pain, reports tightness in his stomach. Denies chest pain.

## 2015-05-18 NOTE — ED Provider Notes (Signed)
CSN: [id number]     Arrival date & time 05/18/15  1117 History   First MD Initiated Contact with Patient 05/18/15 1210     Chief Complaint  Patient presents with  . Rectal Bleeding     (Consider location/radiation/quality/duration/timing/severity/associated sxs/prior Treatment) HPI Comments: 64 year old male no English-speaking presents with blood in the stools recurrently for 2 weeks. Patient is a stroke history no significant blood thinner use. Patient has mesenteric stenosis in his chart. Patient has dyslipidemia as well. Patient's had intermittent nonpainful blood in the stools lighter color small amounts the past 2 weeks no history of similar no history of colonoscopy. No lightheadedness or syncope. No current abdominal pain. No history of diverticular disease or ulcers known.  Patient is a 64 y.o. male presenting with hematochezia. The history is provided by the patient. A language interpreter was used.  Rectal Bleeding Associated symptoms: no abdominal pain, no fever, no light-headedness and no vomiting     Past Medical History  Diagnosis Date  . Hypertension   . Diabetes mellitus   . Stroke   . Iliac artery aneurysm, left   . Dissection of mesenteric artery     a. Mesenteric Artery Duplex (7/15):  pSMA chronic dissection with aneurysmal dilation of 1.29 cm (VVS);  b.  Chest CTA (02/26/14):  IMPRESSION:  1. No aortic dissection or other acute abnormality.  2. 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  3. Stable 17 mm ectasia of the left common iliac artery.  4. Atherosclerosis, including aortoiliac and coronary artery disease.   Marland Kitchen CAD (coronary artery disease)     a. LHC (02/26/14):  mLAD 20 (faint L>R collats), mCFX 50, pRCA 95 (plaque rupture) >> PCI:  Xience DES to pRCA (severe tortuosity of R innominate artery >> needs L radial or FA in future);  b. LHC (03/10/14):  CFX 90, oOM 70, pRCA stent patent >> PCI:  Xience DES to mCFX   . Myocardial infarction   . Hx of  cardiovascular stress test     Lexiscan Myoview (2/16):  Inferior lateral defect c/w thinning vs small prior infarct, no ischemia, EF 49%, Low Risk  . Hx of echocardiogram     a.  Echocardiogram (02/27/14):  Mod focal basal and mild concentric LVH, EF 50-55%, no RWMA, Gr 1 DD, mild TR, normal RVF  . Carotid stenosis     a. Carotid US (02/27/14):  Bilateral ICA 1-39%  . Hyperlipidemia    Past Surgical History  Procedure Laterality Date  . Admission  09/26/2009    CVA.  [name].  . Cardiac catheterization    . Coronary angioplasty    . Left heart catheterization with coronary angiogram N/A 02/26/2014    Procedure: LEFT HEART CATHETERIZATION WITH CORONARY ANGIOGRAM;  Surgeon: Wellington Hampshire, MD;  Location: [facility] CATH LAB;  Service: Cardiovascular;  Laterality: N/A;  . Left heart catheterization with coronary angiogram N/A 03/10/2014    Procedure: LEFT HEART CATHETERIZATION WITH CORONARY ANGIOGRAM;  Surgeon: Jettie Booze, MD;  Location: [facility] CATH LAB;  Service: Cardiovascular;  Laterality: N/A;   Family History  Problem Relation Age of Onset  . Diabetes Father   . Hypertension Father   . Heart attack Father   . Stroke Neg Hx    Social History  Substance Use Topics  . Smoking status: Former Smoker    Quit date: 06/09/2009  . Smokeless tobacco: Never Used  . Alcohol Use: No    Review of Systems  Constitutional: Negative for fever  and chills.  HENT: Negative for congestion.   Eyes: Negative for visual disturbance.  Respiratory: Negative for shortness of breath.   Cardiovascular: Negative for chest pain.  Gastrointestinal: Positive for blood in stool and hematochezia. Negative for vomiting and abdominal pain.  Genitourinary: Negative for dysuria and flank pain.  Musculoskeletal: Negative for back pain, neck pain and neck stiffness.  Skin: Negative for rash.  Neurological: Negative for light-headedness and headaches.      Allergies  Review of patient's allergies indicates no  known allergies.  Home Medications   Prior to Admission medications   Medication Sig Start Date End Date Taking? Authorizing Provider  aspirin 81 MG EC tablet Take 1 tablet (81 mg total) by mouth daily. 02/12/15  Yes Larey Dresser, MD  Blood Glucose Monitoring Suppl (ONE TOUCH ULTRA SYSTEM KIT) W/DEVICE KIT 1 kit by Does not apply route once. One touch delica lancets, and meter Pt will test once daily. Dx 250.92 04/25/14  Yes Chelle Jeffery, PA-C  carvedilol (COREG) 6.25 MG tablet Take 1 tablet (6.25 mg total) by mouth 2 (two) times daily with a meal. Patient taking differently: Take 6.25 mg by mouth daily.  04/03/15  Yes Shawnee Knapp, MD  fluorometholone (FML) 0.1 % ophthalmic suspension Place 1 drop into both eyes 2 (two) times daily.   Yes Historical Provider, MD  gabapentin (NEURONTIN) 300 MG capsule Take 1 capsule (300 mg total) by mouth 3 (three) times daily. And 2 tabs as needed at night for leg pain 04/03/15  Yes Shawnee Knapp, MD  glucose blood test strip Test blood sugar daily. Dx code: 54.92. 04/03/15  Yes Shawnee Knapp, MD  Insulin Glargine (LANTUS SOLOSTAR) 100 UNIT/ML Solostar Pen Inject 10 Units into the skin daily. 12/11/14  Yes Elayne Snare, MD  Insulin Pen Needle (NOVOTWIST) 32G X 5 MM MISC Use 2 per day to inject lantus and victoza 12/17/14  Yes Elayne Snare, MD  Lancets [facility] ULTRASOFT) lancets Test blood sugar 3 times daily. Dx code: E11.8 04/09/15  Yes Shawnee Knapp, MD  polyethylene glycol powder (GLYCOLAX/MIRALAX) powder Take 17 g by mouth 2 (two) times daily as needed for mild constipation. 04/03/15  Yes Shawnee Knapp, MD  amLODipine (NORVASC) 5 MG tablet Take 1 tablet (5 mg total) by mouth daily. Patient not taking: Reported on 05/18/2015 04/20/15   Liliane Shi, PA-C  clopidogrel (PLAVIX) 75 MG tablet Take 1 tablet (75 mg total) by mouth daily with breakfast. Patient not taking: Reported on 05/18/2015 02/12/15   Larey Dresser, MD  docusate sodium (COLACE) 100 MG capsule Take 1 capsule (100 mg  total) by mouth daily. For constipation Patient not taking: Reported on 05/18/2015 04/03/15   Shawnee Knapp, MD  Liraglutide 18 MG/3ML SOPN Inject 0.91m daily for 1 week & then increase to 1.242mdaily. Patient not taking: Reported on 05/18/2015 12/11/14   AjElayne SnareMD  lisinopril (PRINIVIL,ZESTRIL) 20 MG tablet Take 1 tablet (20 mg total) by mouth daily. Patient not taking: Reported on 05/18/2015 04/20/15   ScLiliane ShiPA-C  nitroGLYCERIN (NITROSTAT) 0.4 MG SL tablet Place 1 tablet (0.4 mg total) under the tongue every 5 (five) minutes as needed for chest pain. Patient not taking: Reported on 05/18/2015 04/08/15   DaLarey DresserMD  nortriptyline (PAMELOR) 25 MG capsule Take 1 capsule (25 mg total) by mouth at bedtime. Patient not taking: Reported on 05/18/2015 04/03/15   EvShawnee KnappMD  ranitidine (ZANTAC) 150 MG  tablet Take 1 tablet (150 mg total) by mouth 2 (two) times daily. For abdominal pain Patient not taking: Reported on 05/04/2015 04/03/15   Shawnee Knapp, MD  rosuvastatin (CRESTOR) 40 MG tablet Take 1 tablet (40 mg total) by mouth daily. Patient not taking: Reported on 05/18/2015 02/12/15   Larey Dresser, MD  terbinafine (LAMISIL) 1 % cream Apply 1 application topically 2 (two) times daily. To feet for a month Patient not taking: Reported on 05/18/2015 04/03/15   Shawnee Knapp, MD   BP 150/80 mmHg  Pulse 68  Temp(Src) 97.3 F (36.3 C) (Oral)  Resp 16  SpO2 100% Physical Exam  Constitutional: He is oriented to person, place, and time. He appears well-developed and well-nourished.  HENT:  Head: Normocephalic and atraumatic.  Eyes: Conjunctivae are normal. Right eye exhibits no discharge. Left eye exhibits no discharge.  Neck: Normal range of motion. Neck supple. No tracheal deviation present.  Cardiovascular: Normal rate and regular rhythm.   Pulmonary/Chest: Effort normal and breath sounds normal.  Abdominal: Soft. He exhibits no distension. There is no tenderness. There is no guarding.   Genitourinary:  Brown stool Hemoccult-positive no obvious hemorrhoids appreciated  Musculoskeletal: He exhibits no edema.  Neurological: He is alert and oriented to person, place, and time.  Skin: Skin is warm. No rash noted.  Psychiatric: He has a normal mood and affect.  Nursing note and vitals reviewed.   ED Course  Procedures (including critical care time) Labs Review Labs Reviewed  POC OCCULT BLOOD, ED - Abnormal; Notable for the following:    Fecal Occult Bld POSITIVE (*)    All other components within normal limits  BASIC METABOLIC PANEL  CBC WITH DIFFERENTIAL/PLATELET  I-STAT CG4 LACTIC ACID, ED    Imaging Review No results found. I have personally reviewed and evaluated these images and lab results as part of my medical decision-making.   EKG Interpretation None      MDM   Final diagnoses:  Occult GI bleeding   Patient presents with intermittent small amounts of lighter color blood for 2 weeks. Patient is vitals unremarkable blood counts normal. Lactate normal patient stable for close outpatient follow-up with gastroenterology.  Results and differential diagnosis were discussed with the patient/parent/guardian. Xrays were independently reviewed by myself.  Close follow up outpatient was discussed, comfortable with the plan.   Medications - No data to display  Filed Vitals:   05/18/15 1147  BP: 150/80  Pulse: 68  Temp: 97.3 F (36.3 C)  TempSrc: Oral  Resp: 16  SpO2: 100%    Final diagnoses:  Occult GI bleeding      Elnora Morrison, MD 05/18/15 1349

## 2015-05-18 NOTE — ED Notes (Signed)
Pt alert x4 respirations easy non labored.  

## 2015-05-18 NOTE — Discharge Instructions (Signed)
Follow-up closely with a primary doctor and gastroenterology to discuss colonoscopy. If your bleeding worsens or you feel like in a passive or you have significant abdominal pain return to the ER.  If you were given medicines take as directed.  If you are on coumadin or contraceptives realize their levels and effectiveness is altered by many different medicines.  If you have any reaction (rash, tongues swelling, other) to the medicines stop taking and see a physician.    If your blood pressure was elevated in the ER make sure you follow up for management with a primary doctor or return for chest pain, shortness of breath or stroke symptoms.  Please follow up as directed and return to the ER or see a physician for new or worsening symptoms.  Thank you. Filed Vitals:   05/18/15 1147  BP: 150/80  Pulse: 68  Temp: 97.3 F (36.3 C)  TempSrc: Oral  Resp: 16  SpO2: 100%    Gastrointestinal Bleeding Gastrointestinal bleeding is bleeding somewhere along the path that food travels through the body (digestive tract). This path is anywhere between the mouth and the opening of the butt (anus). You may have blood in your throw up (vomit) or in your poop (stools). If there is a lot of bleeding, you may need to stay in the hospital. [place]  Only take medicine as told by your doctor.  Eat foods with fiber such as whole grains, fruits, and vegetables. You can also try eating 1 to 3 prunes a day.  Drink enough fluids to keep your pee (urine) clear or pale yellow. GET HELP RIGHT AWAY IF:   Your bleeding gets worse.  You feel dizzy, weak, or you pass out (faint).  You have bad cramps in your back or belly (abdomen).  You have large blood clumps (clots) in your poop.  Your problems are getting worse. MAKE SURE YOU:   Understand these instructions.  Will watch your condition.  Will get help right away if you are not doing well or get worse. Document Released: 06/21/2008 Document Revised:  08/29/2012 Document Reviewed: 08/22/2011 [facility] Patient Information 2015 [place], [place]. This information is not intended to replace advice given to you by your health care provider. Make sure you discuss any questions you have with your health care provider.

## 2015-05-18 NOTE — Telephone Encounter (Signed)
Spoke to Dr. Arcola Jansky triage nurse concerning the rectal bleeding reported to Dr. Trula Slade during his visit here.  Because they were not going to be able to see him asap she suggested that the patient go to the ED.  Patient left here and went to [facility] ED.

## 2015-05-18 NOTE — Progress Notes (Signed)
Patient name: Barry [name] MRN: [id number] DOB: Aug 21, 1951 Sex: male     Chief Complaint  Patient presents with  . Re-evaluation    2-3 week follow up Dissection of Mesenteric Artery    HISTORY OF PRESENT ILLNESS: The patient is here today for followup of his abdominal pain which required hospitalization at the end of June 2015. The patient is well known to me, having had an episode of abdominal pain approximately one year ago. 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 During his hospitalization, he had coronary stents placed for a NSTEMI. There is a significant language barrier for the patient. His pain began back in 1984 when he moved to the [place].   the patient continues to complain of some tightness, worse after eating. He continues to have right  Red in his stool every day.  He states approximately half a cup that blood just drips into the toilet.  He denies feeling lightheaded.  He has not had any syncopal events.  Past Medical History  Diagnosis Date  . Hypertension   . Diabetes mellitus   . Stroke   . Iliac artery aneurysm, left   . Dissection of mesenteric artery     a. Mesenteric Artery Duplex (7/15):  pSMA chronic dissection with aneurysmal dilation of 1.29 cm (V[facility]);  b.  Chest CTA (02/26/14):  IMPRESSION:  1. No aortic dissection or other acute abnormality.  2. 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  3. Stable 17 mm ectasia of the left common iliac artery.  4. Atherosclerosis, including aortoiliac and coronary artery disease.   Marland Kitchen CAD (coronary artery disease)     a. LHC (02/26/14):  mLAD 20 (faint L>R collats), mCFX 50, pRCA 95 (plaque rupture) >> PCI:  Xience DES to pRCA (severe tortuosity of R innominate artery >> needs L radial or FA in future);  b. LHC (03/10/14):  CFX 90, oOM 70, pRCA stent patent >> PCI:  Xience DES to mCFX   . Myocardial infarction   . Hx of  cardiovascular stress test     Lexiscan Myoview (2/16):  Inferior lateral defect c/w thinning vs small prior infarct, no ischemia, EF 49%, Low Risk  . Hx of echocardiogram     a.  Echocardiogram (02/27/14):  Mod focal basal and mild concentric LVH, EF 50-55%, no RWMA, Gr 1 DD, mild TR, normal RVF  . Carotid stenosis     a. Carotid US (02/27/14):  Bilateral ICA 1-39%  . Hyperlipidemia     Past Surgical History  Procedure Laterality Date  . Admission  09/26/2009    CVA.  [name].  . Cardiac catheterization    . Coronary angioplasty    . Left heart catheterization with coronary angiogram N/A 02/26/2014    Procedure: LEFT HEART CATHETERIZATION WITH CORONARY ANGIOGRAM;  Surgeon: Wellington Hampshire, MD;  Location: [facility] CATH [facility];  Service: Cardiovascular;  Laterality: N/A;  . Left heart catheterization with coronary angiogram N/A 03/10/2014    Procedure: LEFT HEART CATHETERIZATION WITH CORONARY ANGIOGRAM;  Surgeon: Jettie Booze, MD;  Location: [facility] C[facility] LAB;  Service: Cardiovascular;  Laterality: N/A;    Social History   Social History  . Marital Status: Married    Spouse Name: N/A  . Number of Children: 3  . Years of Education: N/A   Occupational History  . Retired    Social History Main Topics  . Smoking status:  Former Smoker    Quit date: 06/09/2009  . Smokeless tobacco: Never Used  . Alcohol Use: No  . Drug Use: No  . Sexual Activity: Not Currently   Other Topics Concern  . Not on file   Social History Narrative   Marital: married.      Lives: with son,wife.       Children: 3 children; 6 grandchildren      Employed: unemployed; disability unknown reason/CVA L sided weakness      Tobacco:  Quit 2012; smoked 40 years.       Alcohol: no drinking now; social in past.       Drugs: none       Exercise: sporadic.       ADLs:  No driving since CVA.    Family History  Problem Relation Age of Onset  . Diabetes Father   . Hypertension Father   . Heart attack Father   .  Stroke Neg Hx     Allergies as of 05/18/2015  . (No Known Allergies)    Current Outpatient Prescriptions on File Prior to Visit  Medication Sig Dispense Refill  . aspirin 81 MG EC tablet Take 1 tablet (81 mg total) by mouth daily.    . carvedilol (COREG) 6.25 MG tablet Take 1 tablet (6.25 mg total) by mouth 2 (two) times daily with a meal. 60 tablet 3  . clopidogrel (PLAVIX) 75 MG tablet Take 1 tablet (75 mg total) by mouth daily with breakfast. 90 tablet 1  . gabapentin (NEURONTIN) 300 MG capsule Take 1 capsule (300 mg total) by mouth 3 (three) times daily. And 2 tabs as needed at night for leg pain 90 capsule 4  . glucose blood test strip Test blood sugar daily. Dx code: 250.92. 100 each 0  . Insulin Glargine (LANTUS SOLOSTAR) 100 UNIT/ML Solostar Pen Inject 10 Units into the skin daily. 5 pen PRN  . Insulin Pen Needle (NOVOTWIST) 32G X 5 MM MISC Use 2 per day to inject lantus and victoza 90 each 3  . Lancets (ONETOUCH ULTRASOFT) lancets Test blood sugar 3 times daily. Dx code: E11.8 100 each 12  . Liraglutide 18 MG/3ML SOPN Inject 0.66m daily for 1 week & then increase to 1.223mdaily. 6 mL 3  . polyethylene glycol powder (GLYCOLAX/MIRALAX) powder Take 17 g by mouth 2 (two) times daily as needed for mild constipation. 500 g 11  . terbinafine (LAMISIL) 1 % cream Apply 1 application topically 2 (two) times daily. To feet for a month 42 g 3  . amLODipine (NORVASC) 5 MG tablet Take 1 tablet (5 mg total) by mouth daily. (Patient not taking: Reported on 05/18/2015) 30 tablet 11  . Blood Glucose Monitoring Suppl (ONE TOUCH ULTRA SYSTEM KIT) W/DEVICE KIT 1 kit by Does not apply route once. One touch delica lancets, and meter Pt will test once daily. Dx 250.92 (Patient not taking: Reported on 05/18/2015) 1 each 0  . diphenhydrAMINE (BENADRYL) 25 MG tablet Take 50 mg by mouth daily as needed for allergies.    . Marland Kitchenocusate sodium (COLACE) 100 MG capsule Take 1 capsule (100 mg total) by mouth daily. For  constipation (Patient not taking: Reported on 05/18/2015) 90 capsule 1  . fluorometholone (FML) 0.1 % ophthalmic suspension Place 1 drop into both eyes 2 (two) times daily.    . Marland Kitchenisinopril (PRINIVIL,ZESTRIL) 20 MG tablet Take 1 tablet (20 mg total) by mouth daily. (Patient not taking: Reported on 05/18/2015) 30 tablet 11  .  nitroGLYCERIN (NITROSTAT) 0.4 MG SL tablet Place 1 tablet (0.4 mg total) under the tongue every 5 (five) minutes as needed for chest pain. (Patient not taking: Reported on 05/18/2015) 25 tablet 1  . nortriptyline (PAMELOR) 25 MG capsule Take 1 capsule (25 mg total) by mouth at bedtime. (Patient not taking: Reported on 05/18/2015) 30 capsule 3  . ranitidine (ZANTAC) 150 MG tablet Take 1 tablet (150 mg total) by mouth 2 (two) times daily. For abdominal pain (Patient not taking: Reported on 06/03/15) 60 tablet 5  . rosuvastatin (CRESTOR) 40 MG tablet Take 1 tablet (40 mg total) by mouth daily. (Patient not taking: Reported on 05/18/2015) 90 tablet 0   No current facility-administered medications on file prior to visit.     REVIEW OF SYSTEMS:  see history of present illness for pertinent positives and negatives  PHYSICAL EXAMINATION:   Vital signs are  Filed Vitals:   05/18/15 1005  BP: 146/89  Pulse: 70  Temp: 98.2 F (36.8 C)  TempSrc: Oral  Resp: 16  Height: 5' 6.93" (1.7 m)  Weight: 164 lb (74.39 kg)  SpO2: 99%   Body mass index is 25.74 kg/(m^2). General: The patient appears their stated age. HEENT:  No gross abnormalities Pulmonary:  Non labored breathing Abdomen: Soft and non-tender Musculoskeletal: There are no major deformities. Neurologic: No focal weakness or paresthesias are detected, Skin: There are no ulcer or rashes noted. Psychiatric: The patient has normal affect. Cardiovascular: There is a regular rate and rhythm without significant murmur appreciated. Palpable femoral and pedal pulses   Diagnostic Studies Mesenteric Duplex (Date: 2015-06-03):   MESENTERIC ARTERY DUPLEX EVALUATION    INDICATION: Follow-up SMA dissection    PREVIOUS INTERVENTION(S):     DUPLEX EXAM: Exam was performed with a [place] language interpreter present. Beatrix Shipper, Language Resources    ARTERY PEAK SYSTOLIC VELOCITY (cm/s) IMAGE  Aorta 85   Celiac 338   Superior Mesenteric Artery - Proximal 684 1.35cm diameter Dissection present  Superior Mesenteric Artery - Mid 101   Inferior Mesenteric Artery 401   Hepatic  Patent  Splenic  NV     ADDITIONAL FINDINGS: Patient was not NPO due to language barrier for prep instructions.    IMPRESSION: 1. Superior mesenteric artery dissection is observed with stenosis of >70%% and aneurysmal dilation measuring 1.35cm. Large collateral branches are observed. 2. Increased velocities of the celiac axis are observed suggestive of >70%%; origin could not be visualized well enough to evaluate in 2D.           Assessment:  #1: mesenteric dissection #2: GI bleed Plan:  #1: I do not think that the patient's mesenteric dissection is contributing to his abdominal pain which has been present for decades.  I think this is been a chronic finding and completely unrelated to his symptoms. I have been following him for aneurysmal degeneration. I am going to get a CT scan in 6 months to better evaluate the size of the once mesenteric artery as well as a possible stenosis seen on ultrasound.  #2: the patient appears to be having a significant amount of GI bleeding.  I'm going to contact his gastroenterologist and make plans for having this evaluated today.  Eldridge Abrahams, M.D. [facility] Office: [phone] Pager:  [phone]

## 2015-05-18 NOTE — Telephone Encounter (Signed)
Patient will not make his appointment today at 11:00 he is on his way to the emergency room.

## 2015-05-18 NOTE — Addendum Note (Signed)
Addended by: Dorthula Rue L on: 05/18/2015 01:55 PM   Modules accepted: Orders

## 2015-05-19 NOTE — Addendum Note (Signed)
Addended by: Dorthula Rue L on: 05/19/2015 10:07 AM   Modules accepted: Orders

## 2015-05-20 ENCOUNTER — Telehealth: Payer: Self-pay

## 2015-05-20 NOTE — Telephone Encounter (Signed)
Got fax from [facility] reporting that pt may not be taking his carvedilol as Dr Brigitte Pulse Rxd. Reviewed OV notes and OV notes from cardiologist and pt should be taking it twice daily. Called and spoke to pt's son (pt was not there and does not speak english well. Son stated he will check with pt's daughter who knows more about his medications and let her know how he is supposed to be taking it. Advised him to RTC or CB if ?s.

## 2015-05-21 ENCOUNTER — Other Ambulatory Visit: Payer: Self-pay | Admitting: Family Medicine

## 2015-06-02 NOTE — Patient Outreach (Signed)
[facility]) Care Management  06/02/2015  Truett Baumgarten 03-Dec-1950 [id number]   Notification from Thea Silversmith, RN to close case due to unable to contact patient for [place] [facility] services.  Thanks, Ronnell Freshwater. [place], [place] Assistant Phone: [phone] Fax: [phone]

## 2015-06-05 ENCOUNTER — Telehealth: Payer: Self-pay | Admitting: Vascular Surgery

## 2015-06-05 NOTE — Telephone Encounter (Signed)
Spoke with pt's daughter in law - Spine specialist appointment 06/16/15 10:20 am Dr. Joya Salm. [facility] [id number]. She verbalized understanding.

## 2015-06-16 DIAGNOSIS — I10 Essential (primary) hypertension: Secondary | ICD-10-CM | POA: Diagnosis not present

## 2015-06-16 DIAGNOSIS — Z6827 Body mass index (BMI) 27.0-27.9, adult: Secondary | ICD-10-CM | POA: Diagnosis not present

## 2015-06-16 DIAGNOSIS — M549 Dorsalgia, unspecified: Secondary | ICD-10-CM | POA: Diagnosis not present

## 2015-06-17 DIAGNOSIS — M549 Dorsalgia, unspecified: Secondary | ICD-10-CM | POA: Diagnosis not present

## 2015-06-17 DIAGNOSIS — M5126 Other intervertebral disc displacement, lumbar region: Secondary | ICD-10-CM | POA: Diagnosis not present

## 2015-06-17 DIAGNOSIS — M4806 Spinal stenosis, lumbar region: Secondary | ICD-10-CM | POA: Diagnosis not present

## 2015-06-21 NOTE — Progress Notes (Signed)
Cardiology Office Note   Date:  06/22/2015   ID:  Draxton Mickley, DOB Oct 28, 1950, MRN [id number]  PCP:  Lamar Blinks, MD  Cardiologist:  Dr. Loralie Champagne     Chief Complaint  Patient presents with  . Coronary Artery Disease     History of Present Illness: Barry [name] is a 64 y.o. male with a hx of CAD, chronic superior mesenteric artery dissection, prior stroke, DM 2, HTN, CKD, HL. He originally presented in 02/2014 with non-STEMI. LHC demonstrated a ruptured plaque and thrombus in the proximal RCA which was treated with a DES. He was readmitted several weeks later with unstable angina and LHC demonstrated high-grade stenosis in the CFX which was treated with a DES.  He was seen 09/2014 and complained of left-sided chest discomfort and dizziness.  Lexiscan Myoview was low risk with inferior lateral defect consistent with thinning versus small prior infarct. There was no ischemia. EF was 49%.   I saw him 7/16.  He returns for FU.  He arrived to our office today without an interpreter. We called the language services hotline and I was eventually able to get an interpreter to help. Total time spent with the patient today was greater than 90 minutes. He reports 3 days of substernal chest burning. This typically lasts about 10-20 minutes. It is left and right-sided. He can reproduce symptoms by doing something exertional. He can also reproduce symptoms by laying flat. He seems to describe some symptoms of dysphagia. It is not entirely clear if symptoms are completely related to meals. His symptoms are not entirely like his previous angina. He denies associated dyspnea, nausea or diaphoresis. He denies orthopnea or PND. He denies LE edema. He denies syncope. He denies melena or hematochezia.   Studies/Reports Reviewed Today:  Myoview 11/18/14 Exercise Capacity: Lexiscan with no exercise. BP Response: Normal blood pressure response. Clinical Symptoms: No chest pain or dyspnea. ECG Impression:  No significant ST segment change suggestive of ischemia. Comparison with Prior Nuclear Study: No previous nuclear study performed Overall Impression: Low risk stress nuclear study with a small, moderate intensity, fixed inferior lateral defect consistent with thinning vs small prior infarct; no ischemia. LV Ejection Fraction: 49%. LV Wall Motion: Mild global reduction in LV function.   Past Medical History  Diagnosis Date  . Hypertension   . Diabetes mellitus   . Stroke   . Iliac artery aneurysm, left   . Dissection of mesenteric artery     a. Mesenteric Artery Duplex (7/15):  pSMA chronic dissection with aneurysmal dilation of 1.29 cm ([facility]);  b.  Chest CTA (02/26/14):  IMPRESSION:  1. No aortic dissection or other acute abnormality.  2. 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  3. Stable 17 mm ectasia of the left common iliac artery.  4. Atherosclerosis, including aortoiliac and coronary artery disease.   Marland Kitchen CAD (coronary artery disease)     a. LHC (02/26/14):  mLAD 20 (faint L>R collats), mCFX 50, pRCA 95 (plaque rupture) >> PCI:  Xience DES to pRCA (severe tortuosity of R innominate artery >> needs L radial or FA in future);  b. LHC (03/10/14):  CFX 90, oOM 70, pRCA stent patent >> PCI:  Xience DES to mCFX   . Myocardial infarction   . Hx of cardiovascular stress test     Lexiscan Myoview (2/16):  Inferior lateral defect c/w thinning vs small prior infarct, no ischemia, EF 49%, Low Risk  . Hx of echocardiogram     a.  Echocardiogram (  02/27/14):  Mod focal basal and mild concentric LVH, EF 50-55%, no RWMA, Gr 1 DD, mild TR, normal RVF  . Carotid stenosis     a. Carotid US (02/27/14):  Bilateral ICA 1-39%  . Hyperlipidemia   1. HTN 2. Type II diabetes 3. CVA in 2011 4. AAA: 2.2 cm in 3/15.  5. PAD: Left CIA with penetrating ulcer. 6. Chronic superior mesenteric artery dissection, followed by Dr Trula Slade. 7. CKD 8. CAD: NSTEMI 6/15 with 95% proximal RCA stenosis treated with  DES. Unstable angina later in 6/15, this time had DES to 90% mLCx. Echo (6/15) with EF 50-55%, moderate focal basal septal hypertrophy, normal RV size and systolic function. Lexiscan Cardiolite (2/16) with EF 49%, small fixed inferolateral defect with no ischemia.  9. Carotid dopplers (6/15) with mild stenosis.  10. Hyperlipidemia: Did not tolerate atorvastatin.   Past Surgical History  Procedure Laterality Date  . Admission  09/26/2009    CVA.  [name].  . Cardiac catheterization    . Coronary angioplasty    . Left heart catheterization with coronary angiogram N/A 02/26/2014    Procedure: LEFT HEART CATHETERIZATION WITH CORONARY ANGIOGRAM;  Surgeon: Wellington Hampshire, MD;  Location: [facility] CATH [facility];  Service: Cardiovascular;  Laterality: N/A;  . Left heart catheterization with coronary angiogram N/A 03/10/2014    Procedure: LEFT HEART CATHETERIZATION WITH CORONARY ANGIOGRAM;  Surgeon: Jettie Booze, MD;  Location: [facility] CATH LAB;  Service: Cardiovascular;  Laterality: N/A;     Current Outpatient Prescriptions  Medication Sig Dispense Refill  . amLODipine (NORVASC) 5 MG tablet Take 1 tablet (5 mg total) by mouth daily. 30 tablet 11  . aspirin 81 MG EC tablet Take 1 tablet (81 mg total) by mouth daily.    . Blood Glucose Monitoring Suppl (ONE TOUCH ULTRA SYSTEM KIT) W/DEVICE KIT 1 kit by Does not apply route once. One touch delica lancets, and meter Pt will test once daily. Dx 250.92 1 each 0  . carvedilol (COREG) 6.25 MG tablet Take 1 tablet (6.25 mg total) by mouth 2 (two) times daily with a meal. (Patient taking differently: Take 6.25 mg by mouth daily. ) 60 tablet 3  . clopidogrel (PLAVIX) 75 MG tablet Take 1 tablet (75 mg total) by mouth daily with breakfast. 90 tablet 1  . docusate sodium (COLACE) 100 MG capsule Take 1 capsule (100 mg total) by mouth daily. For constipation 90 capsule 1  . fluorometholone (FML) 0.1 % ophthalmic suspension Place 1 drop into both eyes 2 (two) times  daily.    Marland Kitchen gabapentin (NEURONTIN) 300 MG capsule Take 1 capsule (300 mg total) by mouth 3 (three) times daily. And 2 tabs as needed at night for leg pain 90 capsule 4  . glucose blood (ONE TOUCH ULTRA TEST) test strip Test blood sugar 3 times a day. Dx code: E11.8 100 each 10  . Insulin Glargine (LANTUS SOLOSTAR) 100 UNIT/ML Solostar Pen Inject 10 Units into the skin daily. 5 pen PRN  . Insulin Pen Needle (NOVOTWIST) 32G X 5 MM MISC Use 2 per day to inject lantus and victoza 90 each 3  . Lancets (ONETOUCH ULTRASOFT) lancets Test blood sugar 3 times daily. Dx code: E11.8 100 each 12  . Liraglutide 18 MG/3ML SOPN Inject 0.38m daily for 1 week & then increase to 1.265mdaily. 6 mL 3  . lisinopril (PRINIVIL,ZESTRIL) 20 MG tablet Take 1 tablet (20 mg total) by mouth daily. 30 tablet 11  . nitroGLYCERIN (NITROSTAT) 0.4 MG  SL tablet Place 1 tablet (0.4 mg total) under the tongue every 5 (five) minutes as needed for chest pain. 25 tablet 1  . nortriptyline (PAMELOR) 25 MG capsule Take 1 capsule (25 mg total) by mouth at bedtime. 30 capsule 3  . polyethylene glycol powder (GLYCOLAX/MIRALAX) powder Take 17 g by mouth 2 (two) times daily as needed for mild constipation. 500 g 11  . rosuvastatin (CRESTOR) 40 MG tablet Take 1 tablet (40 mg total) by mouth daily. 90 tablet 0  . terbinafine (LAMISIL) 1 % cream Apply 1 application topically 2 (two) times daily. To feet for a month 42 g 3  . isosorbide mononitrate (IMDUR) 30 MG 24 hr tablet Take 1 tablet (30 mg total) by mouth daily. 30 tablet 11  . pantoprazole (PROTONIX) 40 MG tablet Take 1 tablet (40 mg total) by mouth daily. 30 tablet 3   No current facility-administered medications for this visit.    Allergies:   Review of patient's allergies indicates no known allergies.    Social History:  The patient  reports that he quit smoking about 6 years ago. He has never used smokeless tobacco. He reports that he does not drink alcohol or use illicit drugs.    Family History:  The patient's family history includes Diabetes in his father; Heart attack in his father; Hypertension in his father. There is no history of Stroke.    ROS:   Please see the history of present illness.   Review of Systems  All other systems reviewed and are negative.     PHYSICAL EXAM: VS:  BP 150/90 mmHg  Pulse 88  Ht 5' 6"  (1.676 m)  Wt 168 lb 12.8 oz (76.567 kg)  BMI 27.26 kg/m2    Wt Readings from Last 3 Encounters:  06/22/15 168 lb 12.8 oz (76.567 kg)  05/18/15 164 lb (74.39 kg)  05/04/15 164 lb (74.39 kg)     GEN: Well nourished, well developed, in no acute distress HEENT: normal Neck: no JVD,   no masses Cardiac:  Normal S1/S2, RRR; no murmur ,  no rubs or gallops, no edema   Respiratory:  clear to auscultation bilaterally, no wheezing, rhonchi or rales. GI: soft, nontender, nondistended, + BS MS: no deformity or atrophy Skin: warm and dry  Neuro:  CNs II-XII intact, Strength and sensation are intact Psych: Normal affect   EKG:  EKG is not ordered today.  It demonstrates:   NSR, HR 79, inf Q waves, LVH, NSSTTW changes, no significant change since last tracing.   Recent Labs: 04/20/2015: ALT 21 06/22/2015: BUN 18; Creatinine, Ser 0.99; Hemoglobin 14.7; Platelets 166.0; Potassium 3.8; Sodium 139    Lipid Panel    Component Value Date/Time   CHOL 157 04/20/2015 0951   TRIG 207.0* 04/20/2015 0951   HDL 30.30* 04/20/2015 0951   CHOLHDL 5 04/20/2015 0951   VLDL 41.4* 04/20/2015 0951   LDLCALC 143* 12/18/2014 0834   LDLDIRECT 101.0 04/20/2015 0951      ASSESSMENT AND PLAN:    1. Chest Pain:  He has symptoms that are concerning for angina.  However, he also has symptoms that sound concerning for GI related symptoms. He had a stress test earlier this year that was low risk. Symptoms are not like previous angina.  But, they do occur with activity and are relieved by NTG.  I discussed his case with Dr. Loralie Champagne by phone.  Because of the  language barrier, it is difficult to completely assess his symptoms.  We have recommended proceeding with cardiac cath to better define his symptoms.  Risks and benefits of cardiac catheterization have been discussed with the patient.  These include bleeding, infection, kidney damage, stroke, heart attack, death.  His wife expresses great concerns about the procedure.  I spent > 20 minutes trying to explain the high risk of not proceeding with further testing vs the low risk of cardiac cath.  He understands all of this and is not willing to proceed.  I will start Imdur 30 mg QD.  DC Zantac. Start Protonix 40 mg QD.  Check stat Troponin. Get BMET, CBC today.  If patient has more pain, he should go to the ED.  If Troponin abnormal, he will need to go to the hospital for admission.   2.  CAD: He is s/p PCI to LCx and RCA. EF low normal on echo and mildly decreased on recent Myoview. He is on ASA 81, Plavix 75, carvedilol, ACEI, statin. Recent Myoview was low risk. He now has symptoms that are concerning for angina relieved by NTG. Cannot determine if symptoms are ischemic or GI related.  Cardiac cath has been discussed with the patient and his wife.  He is not willing to proceed.  He understands the risk of not proceeding with testing at this time.  Long-acting nitrates have been added to his medical regimen as outlined above. Stat troponin will be obtained. He will continue on aspirin, Plavix, beta blocker, amlodipine, statin, ACE inhibitor. 3.  PAD: Penetrating ulcer left CIA and chronic SMA dissection. Last CTA abdomen did not show any changes in the appearance of the SMA. He is followed by [facility]. 4. Smoking:  He has quit smoking.    5. Hyperlipidemia:  Recent LDL 101.   He will need to have his Lipids rechecked. 6. HTN: uncontrolled.  Start Imdur 30 mg QD as noted.  7. AAA: Will be followed by [facility].      Medication Changes: Current medicines are reviewed at length with the patient today.   Concerns regarding medicines are as outlined above.  The following changes have been made:   Discontinued Medications   RANITIDINE (ZANTAC) 150 MG TABLET    Take 1 tablet (150 mg total) by mouth 2 (two) times daily. For abdominal pain   Modified Medications   No medications on file   New Prescriptions   ISOSORBIDE MONONITRATE (IMDUR) 30 MG 24 HR TABLET    Take 1 tablet (30 mg total) by mouth daily.   PANTOPRAZOLE (PROTONIX) 40 MG TABLET    Take 1 tablet (40 mg total) by mouth daily.   Labs/ tests ordered today include:   Orders Placed This Encounter  Procedures  . Basic Metabolic Panel (BMET)  . CBC w/Diff  . Troponin I  . EKG 12-Lead     Disposition:    FU Dr. Loralie Champagne or me 1 week.     Signed, Versie Starks, MHS 06/22/2015 5:26 PM    [facility] [place], [place], [place]  [address] Phone: [phone]; Fax: [phone]

## 2015-06-22 ENCOUNTER — Ambulatory Visit (INDEPENDENT_AMBULATORY_CARE_PROVIDER_SITE_OTHER): Payer: Medicare Other | Admitting: Physician Assistant

## 2015-06-22 ENCOUNTER — Encounter: Payer: Self-pay | Admitting: *Deleted

## 2015-06-22 ENCOUNTER — Encounter: Payer: Self-pay | Admitting: Physician Assistant

## 2015-06-22 VITALS — BP 150/90 | HR 88 | Ht 66.0 in | Wt 168.8 lb

## 2015-06-22 DIAGNOSIS — R079 Chest pain, unspecified: Secondary | ICD-10-CM | POA: Diagnosis not present

## 2015-06-22 DIAGNOSIS — I10 Essential (primary) hypertension: Secondary | ICD-10-CM

## 2015-06-22 DIAGNOSIS — I714 Abdominal aortic aneurysm, without rupture, unspecified: Secondary | ICD-10-CM

## 2015-06-22 DIAGNOSIS — E785 Hyperlipidemia, unspecified: Secondary | ICD-10-CM

## 2015-06-22 DIAGNOSIS — L98499 Non-pressure chronic ulcer of skin of other sites with unspecified severity: Secondary | ICD-10-CM

## 2015-06-22 DIAGNOSIS — I25119 Atherosclerotic heart disease of native coronary artery with unspecified angina pectoris: Secondary | ICD-10-CM

## 2015-06-22 DIAGNOSIS — I2510 Atherosclerotic heart disease of native coronary artery without angina pectoris: Secondary | ICD-10-CM | POA: Diagnosis not present

## 2015-06-22 DIAGNOSIS — I739 Peripheral vascular disease, unspecified: Secondary | ICD-10-CM

## 2015-06-22 DIAGNOSIS — Z720 Tobacco use: Secondary | ICD-10-CM

## 2015-06-22 LAB — CBC WITH DIFFERENTIAL/PLATELET
Basophils Absolute: 0 10*3/uL (ref 0.0–0.1)
Basophils Relative: 0.4 % (ref 0.0–3.0)
Eosinophils Absolute: 0.2 10*3/uL (ref 0.0–0.7)
Eosinophils Relative: 2.1 % (ref 0.0–5.0)
HCT: 45.4 % (ref 39.0–52.0)
Hemoglobin: 14.7 g/dL (ref 13.0–17.0)
Lymphocytes Relative: 20.8 % (ref 12.0–46.0)
Lymphs Abs: 1.5 10*3/uL (ref 0.7–4.0)
MCHC: 32.5 g/dL (ref 30.0–36.0)
MCV: 88.2 fl (ref 78.0–100.0)
Monocytes Absolute: 0.4 10*3/uL (ref 0.1–1.0)
Monocytes Relative: 5.2 % (ref 3.0–12.0)
Neutro Abs: 5.2 10*3/uL (ref 1.4–7.7)
Neutrophils Relative %: 71.5 % (ref 43.0–77.0)
Platelets: 166 10*3/uL (ref 150.0–400.0)
RBC: 5.15 Mil/uL (ref 4.22–5.81)
RDW: 14.1 % (ref 11.5–15.5)
WBC: 7.3 10*3/uL (ref 4.0–10.5)

## 2015-06-22 LAB — BASIC METABOLIC PANEL
BUN: 18 mg/dL (ref 6–23)
CO2: 25 mEq/L (ref 19–32)
Calcium: 9 mg/dL (ref 8.4–10.5)
Chloride: 106 mEq/L (ref 96–112)
Creatinine, Ser: 0.99 mg/dL (ref 0.40–1.50)
GFR: 80.53 mL/min (ref >60.00)
Glucose, Bld: 143 mg/dL — ABNORMAL HIGH (ref 70–99)
Potassium: 3.8 mEq/L (ref 3.5–5.1)
Sodium: 139 mEq/L (ref 135–145)

## 2015-06-22 LAB — TROPONIN I: TNIDX: 0.01 ug/l (ref 0.00–0.06)

## 2015-06-22 MED ORDER — ISOSORBIDE MONONITRATE ER 30 MG PO TB24: 30.0000 mg | ORAL_TABLET | Freq: Every day | ORAL | Status: DC

## 2015-06-22 MED ORDER — PANTOPRAZOLE SODIUM 40 MG PO TBEC: 40.0000 mg | DELAYED_RELEASE_TABLET | Freq: Every day | ORAL | Status: DC

## 2015-06-22 NOTE — Patient Instructions (Addendum)
Medication Instructions:  1. STOP ZANTAC  2. START IMDUR 30 MG DAILY  3. START PROTONIX 40 MG DAILY  Labwork: TODAY BMET, CBC W/DIFF, STAT TROPONIN   Testing/Procedures:  Follow-Up: 1-2 WEEKS SCOTT WEAVER, PAC SAME DAY DR. Aundra Dubin IS IN THE OFFICE  Any Other Special Instructions Will Be Listed Below (If Applicable). IF YOU HAVE FURTHER CHEST PAIN YOU ARE TO GO TO [place]

## 2015-06-23 ENCOUNTER — Telehealth: Payer: Self-pay | Admitting: *Deleted

## 2015-06-23 ENCOUNTER — Ambulatory Visit (HOSPITAL_COMMUNITY): Admit: 2015-06-23 | Payer: Self-pay | Admitting: Cardiology

## 2015-06-23 ENCOUNTER — Encounter (HOSPITAL_COMMUNITY): Payer: Self-pay

## 2015-06-23 SURGERY — LEFT HEART CATH AND CORONARY ANGIOGRAPHY
Anesthesia: LOCAL

## 2015-06-23 NOTE — Telephone Encounter (Signed)
I called the interpreter line to make sure pt is scheduled to have an interpreter at his next appt. I did state that PA spent well over an 1 hour w/interpreter on phone trying to care for pt at [facility] yesterday.

## 2015-06-23 NOTE — Telephone Encounter (Signed)
Pt son Pros has been notified of lab results by phone.  Advised Troponin was negative.

## 2015-06-24 ENCOUNTER — Emergency Department (HOSPITAL_COMMUNITY): Payer: Medicare Other

## 2015-06-24 ENCOUNTER — Encounter (HOSPITAL_COMMUNITY): Payer: Self-pay | Admitting: Emergency Medicine

## 2015-06-24 ENCOUNTER — Observation Stay (HOSPITAL_COMMUNITY)
Admission: EM | Admit: 2015-06-24 | Discharge: 2015-06-26 | Disposition: A | Discharge status: Home / Self Care | Payer: Medicare Other | Attending: Internal Medicine | Admitting: Internal Medicine

## 2015-06-24 DIAGNOSIS — I25110 Atherosclerotic heart disease of native coronary artery with unstable angina pectoris: Principal | ICD-10-CM | POA: Insufficient documentation

## 2015-06-24 DIAGNOSIS — Z7982 Long term (current) use of aspirin: Secondary | ICD-10-CM | POA: Diagnosis not present

## 2015-06-24 DIAGNOSIS — R079 Chest pain, unspecified: Secondary | ICD-10-CM | POA: Diagnosis not present

## 2015-06-24 DIAGNOSIS — Z87891 Personal history of nicotine dependence: Secondary | ICD-10-CM | POA: Insufficient documentation

## 2015-06-24 DIAGNOSIS — Z7902 Long term (current) use of antithrombotics/antiplatelets: Secondary | ICD-10-CM | POA: Insufficient documentation

## 2015-06-24 DIAGNOSIS — Z8673 Personal history of transient ischemic attack (TIA), and cerebral infarction without residual deficits: Secondary | ICD-10-CM | POA: Insufficient documentation

## 2015-06-24 DIAGNOSIS — I10 Essential (primary) hypertension: Secondary | ICD-10-CM | POA: Insufficient documentation

## 2015-06-24 DIAGNOSIS — Z955 Presence of coronary angioplasty implant and graft: Secondary | ICD-10-CM | POA: Insufficient documentation

## 2015-06-24 DIAGNOSIS — E785 Hyperlipidemia, unspecified: Secondary | ICD-10-CM | POA: Insufficient documentation

## 2015-06-24 DIAGNOSIS — E876 Hypokalemia: Secondary | ICD-10-CM | POA: Diagnosis not present

## 2015-06-24 DIAGNOSIS — I252 Old myocardial infarction: Secondary | ICD-10-CM | POA: Insufficient documentation

## 2015-06-24 DIAGNOSIS — Z79899 Other long term (current) drug therapy: Secondary | ICD-10-CM | POA: Insufficient documentation

## 2015-06-24 DIAGNOSIS — E119 Type 2 diabetes mellitus without complications: Secondary | ICD-10-CM | POA: Diagnosis not present

## 2015-06-24 DIAGNOSIS — Z794 Long term (current) use of insulin: Secondary | ICD-10-CM | POA: Diagnosis not present

## 2015-06-24 DIAGNOSIS — R0789 Other chest pain: Secondary | ICD-10-CM | POA: Diagnosis not present

## 2015-06-24 DIAGNOSIS — I2510 Atherosclerotic heart disease of native coronary artery without angina pectoris: Secondary | ICD-10-CM | POA: Diagnosis not present

## 2015-06-24 DIAGNOSIS — I6523 Occlusion and stenosis of bilateral carotid arteries: Secondary | ICD-10-CM | POA: Diagnosis not present

## 2015-06-24 LAB — BASIC METABOLIC PANEL
Anion gap: 9 (ref 5–15)
BUN: 13 mg/dL (ref 6–20)
CO2: 25 mmol/L (ref 22–32)
Calcium: 9 mg/dL (ref 8.9–10.3)
Chloride: 106 mmol/L (ref 101–111)
Creatinine, Ser: 1.05 mg/dL (ref 0.61–1.24)
GFR calc Af Amer: >60 mL/min (ref >60)
GFR calc non Af Amer: >60 mL/min (ref >60)
Glucose, Bld: 173 mg/dL — ABNORMAL HIGH (ref 65–99)
Potassium: 3.3 mmol/L — ABNORMAL LOW (ref 3.5–5.1)
Sodium: 140 mmol/L (ref 135–145)

## 2015-06-24 LAB — CBC
HCT: 43.7 % (ref 39.0–52.0)
Hemoglobin: 14 g/dL (ref 13.0–17.0)
MCH: 28.3 pg (ref 26.0–34.0)
MCHC: 32 g/dL (ref 30.0–36.0)
MCV: 88.3 fL (ref 78.0–100.0)
Platelets: 171 10*3/uL (ref 150–400)
RBC: 4.95 MIL/uL (ref 4.22–5.81)
RDW: 13 % (ref 11.5–15.5)
WBC: 7.7 10*3/uL (ref 4.0–10.5)

## 2015-06-24 LAB — I-STAT TROPONIN, ED: Troponin i, poc: 0 ng/mL (ref 0.00–0.08)

## 2015-06-24 MED ORDER — NITROGLYCERIN 2 % TD OINT
0.5000 [in_us] | TOPICAL_OINTMENT | Freq: Once | TRANSDERMAL | Status: AC
  Administered 2015-06-24: 0.5 [in_us] via TOPICAL
  Filled 2015-06-24: qty 1

## 2015-06-24 NOTE — ED Provider Notes (Signed)
CSN: [id number]     Arrival date & time 06/24/15  2011 History   First MD Initiated Contact with Patient 06/24/15 2049     Chief Complaint  Patient presents with  . Chest Pain    The history is provided by the patient, the spouse, a relative and medical records.     64 year old male with history of CAD status post MI/PCI, reduced EF, carotid stenosis, CVA, chronic SMA dissection, diabetes, hypertension presenting with chest pain. Onset was this morning, and rest. Located in substernal area. Radiating to all limbs but particularly left arm. Described as pressure. Associated with shortness of breath and nausea as well as headache and "feeling hot". Similar to prior MIs. Improved by nitroglycerin. 2 episodes this morning that resolved, but patient came in today because pain persisted. Of note, patient was seen in cardiology clinic yesterday for recurrent chest pain episodes and plan was to perform cardiac catheterization if pain continued.   Past Medical History  Diagnosis Date  . Hypertension   . Diabetes mellitus   . Stroke   . Iliac artery aneurysm, left   . Dissection of mesenteric artery     a. Mesenteric Artery Duplex (7/15):  pSMA chronic dissection with aneurysmal dilation of 1.29 cm ([facility]);  b.  Chest CTA (02/26/14):  IMPRESSION:  1. No aortic dissection or other acute abnormality.  2. 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  3. Stable 17 mm ectasia of the left common iliac artery.  4. Atherosclerosis, including aortoiliac and coronary artery disease.   Marland Kitchen CAD (coronary artery disease)     a. LHC (02/26/14):  mLAD 20 (faint L>R collats), mCFX 50, pRCA 95 (plaque rupture) >> PCI:  Xience DES to pRCA (severe tortuosity of R innominate artery >> needs L radial or FA in future);  b. LHC (03/10/14):  CFX 90, oOM 70, pRCA stent patent >> PCI:  Xience DES to mCFX   . Myocardial infarction   . Hx of cardiovascular stress test     Lexiscan Myoview (2/16):  Inferior lateral defect c/w  thinning vs small prior infarct, no ischemia, EF 49%, Low Risk  . Hx of echocardiogram     a.  Echocardiogram (02/27/14):  Mod focal basal and mild concentric LVH, EF 50-55%, no RWMA, Gr 1 DD, mild TR, normal RVF  . Carotid stenosis     a. Carotid US (02/27/14):  Bilateral ICA 1-39%  . Hyperlipidemia    Past Surgical History  Procedure Laterality Date  . Admission  09/26/2009    CVA.  [name].  . Cardiac catheterization    . Coronary angioplasty    . Left heart catheterization with coronary angiogram N/A 02/26/2014    Procedure: LEFT HEART CATHETERIZATION WITH CORONARY ANGIOGRAM;  Surgeon: Wellington Hampshire, MD;  Location: [facility] CATH LAB;  Service: Cardiovascular;  Laterality: N/A;  . Left heart catheterization with coronary angiogram N/A 03/10/2014    Procedure: LEFT HEART CATHETERIZATION WITH CORONARY ANGIOGRAM;  Surgeon: Jettie Booze, MD;  Location: [facility] CATH LAB;  Service: Cardiovascular;  Laterality: N/A;   Family History  Problem Relation Age of Onset  . Diabetes Father   . Hypertension Father   . Heart attack Father   . Stroke Neg Hx    Social History  Substance Use Topics  . Smoking status: Former Smoker    Quit date: 06/09/2009  . Smokeless tobacco: Never Used  . Alcohol Use: No    Review of Systems  Constitutional: Negative for fever.  HENT: Negative for rhinorrhea.   Eyes: Negative for visual disturbance.  Respiratory: Positive for shortness of breath.   Cardiovascular: Positive for chest pain.  Gastrointestinal: Positive for nausea. Negative for vomiting and abdominal pain.  Genitourinary: Negative for decreased urine volume.  Skin: Negative for rash.  Allergic/Immunologic: Negative for immunocompromised state.  Neurological: Positive for numbness (endorses feeling of numbness/altered sensation throughout entire body) and headaches. Negative for syncope.  Psychiatric/Behavioral: Negative for confusion.      Allergies  Review of patient's allergies indicates  no known allergies.  Home Medications   Prior to Admission medications   Medication Sig Start Date End Date Taking? Authorizing Provider  aspirin 81 MG EC tablet Take 1 tablet (81 mg total) by mouth daily. 02/12/15  Yes Larey Dresser, MD  carvedilol (COREG) 6.25 MG tablet Take 1 tablet (6.25 mg total) by mouth 2 (two) times daily with a meal. 04/03/15  Yes Shawnee Knapp, MD  clopidogrel (PLAVIX) 75 MG tablet Take 1 tablet (75 mg total) by mouth daily with breakfast. 02/12/15  Yes Larey Dresser, MD  gabapentin (NEURONTIN) 300 MG capsule Take 1 capsule (300 mg total) by mouth 3 (three) times daily. And 2 tabs as needed at night for leg pain 04/03/15  Yes Shawnee Knapp, MD  isosorbide mononitrate (IMDUR) 30 MG 24 hr tablet Take 1 tablet (30 mg total) by mouth daily. 06/22/15  Yes Liliane Shi, PA-C  Liraglutide 18 MG/3ML SOPN Inject 0.15m daily for 1 week & then increase to 1.240mdaily. Patient taking differently: Inject 1.2 mg into the skin daily.  12/11/14  Yes AjElayne SnareMD  lisinopril (PRINIVIL,ZESTRIL) 20 MG tablet Take 1 tablet (20 mg total) by mouth daily. 04/20/15  Yes ScLiliane ShiPA-C  nitroGLYCERIN (NITROSTAT) 0.4 MG SL tablet Place 1 tablet (0.4 mg total) under the tongue every 5 (five) minutes as needed for chest pain. 04/08/15  Yes DaLarey DresserMD  rosuvastatin (CRESTOR) 40 MG tablet Take 1 tablet (40 mg total) by mouth daily. 02/12/15  Yes DaLarey DresserMD  amLODipine (NORVASC) 5 MG tablet Take 1 tablet (5 mg total) by mouth daily. Patient not taking: Reported on 06/24/2015 04/20/15   ScLiliane ShiPA-C  Blood Glucose Monitoring Suppl (ONE TOUCH ULTRA SYSTEM KIT) W/DEVICE KIT 1 kit by Does not apply route once. One touch delica lancets, and meter Pt will test once daily. Dx 250.92 04/25/14   ChHarrison MonsPA-C  docusate sodium (COLACE) 100 MG capsule Take 1 capsule (100 mg total) by mouth daily. For constipation Patient not taking: Reported on 06/24/2015 04/03/15   EvShawnee KnappMD   glucose blood (ONE TOUCH ULTRA TEST) test strip Test blood sugar 3 times a day. Dx code: E11.8 05/22/15   EvShawnee KnappMD  Insulin Glargine (LANTUS SOLOSTAR) 100 UNIT/ML Solostar Pen Inject 10 Units into the skin daily. Patient not taking: Reported on 06/24/2015 12/11/14   AjElayne SnareMD  Insulin Pen Needle (NOVOTWIST) 32G X 5 MM MISC Use 2 per day to inject lantus and victoza 12/17/14   AjElayne SnareMD  Lancets ([facility] Hospital[facility]TR[facility]OFT) lancets Test blood sugar 3 times daily. Dx code: E11.8 04/09/15   EvShawnee KnappMD  nortriptyline (PAMELOR) 25 MG capsule Take 1 capsule (25 mg total) by mouth at bedtime. Patient not taking: Reported on 06/24/2015 04/03/15   EvShawnee KnappMD  pantoprazole (PROTONIX) 40 MG tablet Take 1 tablet (40 mg total) by mouth daily.  Patient not taking: Reported on 06/24/2015 06/22/15   Liliane Shi, PA-C  polyethylene glycol powder (GLYCOLAX/MIRALAX) powder Take 17 g by mouth 2 (two) times daily as needed for mild constipation. Patient not taking: Reported on 06/24/2015 04/03/15   Shawnee Knapp, MD  terbinafine (LAMISIL) 1 % cream Apply 1 application topically 2 (two) times daily. To feet for a month Patient not taking: Reported on 06/24/2015 04/03/15   Shawnee Knapp, MD   BP 139/83 mmHg  Pulse 77  Temp(Src) 98.8 F (37.1 C) (Oral)  Resp 14  Ht 5' 4.96" (1.65 m)  Wt 168 lb (76.204 kg)  BMI 27.99 kg/m2  SpO2 96% Physical Exam  Constitutional: He is oriented to person, place, and time. He appears well-developed and well-nourished. No distress.  HENT:  Head: Normocephalic and atraumatic.  Eyes: Right eye exhibits no discharge. Left eye exhibits no discharge.  Neck: No tracheal deviation present.  Cardiovascular: Normal rate and regular rhythm.   Pulmonary/Chest: Effort normal and breath sounds normal. No respiratory distress.  Abdominal: Soft. He exhibits no distension. There is no tenderness.  Musculoskeletal: He exhibits no edema.  Neurological: He is alert and oriented to person, place,  and time. He has normal strength. No cranial nerve deficit or sensory deficit.  Skin: Skin is warm and dry. He is not diaphoretic.  Psychiatric: He has a normal mood and affect. His behavior is normal.    ED Course  Procedures (including critical care time) Labs Review Labs Reviewed  BASIC METABOLIC PANEL - Abnormal; Notable for the following:    Potassium 3.3 (*)    Glucose, Bld 173 (*)    All other components within normal limits  CBC  I-STAT TROPOININ, ED    Imaging Review Dg Chest 2 View  06/24/2015   CLINICAL DATA:  Left chest pain today.  EXAM: CHEST  2 VIEW  COMPARISON:  October 13, 2014  FINDINGS: The heart size and mediastinal contours are within normal limits. There is no focal infiltrate, pulmonary edema, or pleural effusion. The visualized skeletal structures are stable.  IMPRESSION: No active cardiopulmonary disease.   Electronically Signed   By: Abelardo Diesel M.D.   On: 06/24/2015 21:11   I have personally reviewed and evaluated these images and lab results as part of my medical decision-making.   EKG Interpretation None      MDM   Final diagnoses:  None    64 year old male with history of CAD status post MI/PCI, reduced EF, carotid stenosis, CVA, chronic SMA dissection, diabetes, hypertension presenting with chest pain. AFVSS. Seen by cardiology yesterday, who planned to perform catheterization if chest pain episodes continued. No acute findings on chest x-ray or EKG, troponin negative 1. No leukocytosis or anemia. Given aspirin and Nitropaste with moderate improvement in chest pain. Given discussion with cardiology yesterday, cardiology consulted.   Admitted for further management.  Case discussed with Dr. Wilson Singer, who oversaw management of this patient.    Ivin Booty, MD 06/25/15 [id number]  Virgel Manifold, MD 06/26/15 [phone]

## 2015-06-24 NOTE — ED Notes (Signed)
Pt with hx of MI's in the past- has stent in mid LCX. Pt has not felt good all day and now this evening he c/o mid sternal burning cp. sts when pain comes on it pulsates through entire body. Also c.o headache.

## 2015-06-24 NOTE — ED Notes (Signed)
PA at bedside.

## 2015-06-25 ENCOUNTER — Encounter (HOSPITAL_COMMUNITY)
Admission: EM | Disposition: A | Discharge status: Home / Self Care | Payer: Self-pay | Source: Home / Self Care | Attending: Emergency Medicine

## 2015-06-25 DIAGNOSIS — I200 Unstable angina: Secondary | ICD-10-CM | POA: Diagnosis not present

## 2015-06-25 DIAGNOSIS — R079 Chest pain, unspecified: Secondary | ICD-10-CM | POA: Insufficient documentation

## 2015-06-25 DIAGNOSIS — I2510 Atherosclerotic heart disease of native coronary artery without angina pectoris: Secondary | ICD-10-CM | POA: Diagnosis not present

## 2015-06-25 DIAGNOSIS — I209 Angina pectoris, unspecified: Secondary | ICD-10-CM

## 2015-06-25 HISTORY — PX: CARDIAC CATHETERIZATION: SHX172

## 2015-06-25 LAB — CBC
HCT: 42.1 % (ref 39.0–52.0)
Hemoglobin: 13.7 g/dL (ref 13.0–17.0)
MCH: 28.7 pg (ref 26.0–34.0)
MCHC: 32.5 g/dL (ref 30.0–36.0)
MCV: 88.3 fL (ref 78.0–100.0)
Platelets: 161 10*3/uL (ref 150–400)
RBC: 4.77 MIL/uL (ref 4.22–5.81)
RDW: 13.1 % (ref 11.5–15.5)
WBC: 8 10*3/uL (ref 4.0–10.5)

## 2015-06-25 LAB — BASIC METABOLIC PANEL
Anion gap: 7 (ref 5–15)
BUN: 13 mg/dL (ref 6–20)
CO2: 26 mmol/L (ref 22–32)
Calcium: 9 mg/dL (ref 8.9–10.3)
Chloride: 106 mmol/L (ref 101–111)
Creatinine, Ser: 1.07 mg/dL (ref 0.61–1.24)
GFR calc Af Amer: >60 mL/min (ref >60)
GFR calc non Af Amer: >60 mL/min (ref >60)
Glucose, Bld: 120 mg/dL — ABNORMAL HIGH (ref 65–99)
Potassium: 3.7 mmol/L (ref 3.5–5.1)
Sodium: 139 mmol/L (ref 135–145)

## 2015-06-25 LAB — TROPONIN I
Troponin I: <0.03 ng/mL (ref <0.031)
Troponin I: <0.03 ng/mL (ref <0.031)

## 2015-06-25 LAB — PROTIME-INR
INR: 1.05 (ref 0.00–1.49)
Prothrombin Time: 13.9 seconds (ref 11.6–15.2)

## 2015-06-25 LAB — GLUCOSE, CAPILLARY
Glucose-Capillary: 135 mg/dL — ABNORMAL HIGH (ref 65–99)
Glucose-Capillary: 112 mg/dL — ABNORMAL HIGH (ref 65–99)
Glucose-Capillary: 92 mg/dL (ref 65–99)
Glucose-Capillary: 235 mg/dL — ABNORMAL HIGH (ref 65–99)

## 2015-06-25 LAB — I-STAT TROPONIN, ED: Troponin i, poc: 0 ng/mL (ref 0.00–0.08)

## 2015-06-25 LAB — POCT ACTIVATED CLOTTING TIME: Activated Clotting Time: 159 seconds

## 2015-06-25 LAB — HEPARIN LEVEL (UNFRACTIONATED): Heparin Unfractionated: 0.6 IU/mL (ref 0.30–0.70)

## 2015-06-25 LAB — MRSA PCR SCREENING: MRSA by PCR: NEGATIVE

## 2015-06-25 SURGERY — LEFT HEART CATH AND CORONARY ANGIOGRAPHY

## 2015-06-25 MED ORDER — SODIUM CHLORIDE 0.9 % IV SOLN: 250.0000 mL | INTRAVENOUS | Status: DC | PRN

## 2015-06-25 MED ORDER — LIRAGLUTIDE 18 MG/3ML ~~LOC~~ SOPN: 1.2000 mg | PEN_INJECTOR | Freq: Every day | SUBCUTANEOUS | Status: DC

## 2015-06-25 MED ORDER — HEPARIN (PORCINE) IN NACL 100-0.45 UNIT/ML-% IJ SOLN
900.0000 [IU]/h | INTRAMUSCULAR | Status: DC
  Administered 2015-06-25: 900 [IU]/h via INTRAVENOUS
  Filled 2015-06-25: qty 250

## 2015-06-25 MED ORDER — POLYETHYLENE GLYCOL 3350 17 G PO PACK: 17.0000 g | PACK | Freq: Two times a day (BID) | ORAL | Status: DC | PRN

## 2015-06-25 MED ORDER — SODIUM CHLORIDE 0.9 % IJ SOLN: 3.0000 mL | INTRAMUSCULAR | Status: DC | PRN

## 2015-06-25 MED ORDER — SODIUM CHLORIDE 0.9 % IJ SOLN: 3.0000 mL | Freq: Two times a day (BID) | INTRAMUSCULAR | Status: DC

## 2015-06-25 MED ORDER — FENTANYL CITRATE (PF) 100 MCG/2ML IJ SOLN
INTRAMUSCULAR | Status: AC
  Filled 2015-06-25: qty 4

## 2015-06-25 MED ORDER — DOCUSATE SODIUM 100 MG PO CAPS
100.0000 mg | ORAL_CAPSULE | Freq: Every day | ORAL | Status: DC
  Administered 2015-06-25 – 2015-06-26 (×2): 100 mg via ORAL
  Filled 2015-06-25 (×2): qty 1

## 2015-06-25 MED ORDER — HEPARIN (PORCINE) IN NACL 2-0.9 UNIT/ML-% IJ SOLN
INTRAMUSCULAR | Status: AC
  Filled 2015-06-25: qty 1000

## 2015-06-25 MED ORDER — SODIUM CHLORIDE 0.9 % WEIGHT BASED INFUSION: 1.0000 mL/kg/h | INTRAVENOUS | Status: DC

## 2015-06-25 MED ORDER — SODIUM CHLORIDE 0.9 % WEIGHT BASED INFUSION
3.0000 mL/kg/h | INTRAVENOUS | Status: DC
  Administered 2015-06-25: 3 mL/kg/h via INTRAVENOUS

## 2015-06-25 MED ORDER — ASPIRIN 81 MG PO TBEC: 81.0000 mg | DELAYED_RELEASE_TABLET | Freq: Every day | ORAL | Status: DC

## 2015-06-25 MED ORDER — MIDAZOLAM HCL 2 MG/2ML IJ SOLN
INTRAMUSCULAR | Status: AC
  Filled 2015-06-25: qty 4

## 2015-06-25 MED ORDER — PANTOPRAZOLE SODIUM 40 MG PO TBEC
40.0000 mg | DELAYED_RELEASE_TABLET | Freq: Every day | ORAL | Status: DC
  Administered 2015-06-25 – 2015-06-26 (×2): 40 mg via ORAL
  Filled 2015-06-25 (×2): qty 1

## 2015-06-25 MED ORDER — ONDANSETRON HCL 4 MG/2ML IJ SOLN: 4.0000 mg | Freq: Four times a day (QID) | INTRAMUSCULAR | Status: DC | PRN

## 2015-06-25 MED ORDER — SODIUM CHLORIDE 0.9 % IJ SOLN
3.0000 mL | Freq: Two times a day (BID) | INTRAMUSCULAR | Status: DC
  Administered 2015-06-26: 3 mL via INTRAVENOUS

## 2015-06-25 MED ORDER — LISINOPRIL 20 MG PO TABS
20.0000 mg | ORAL_TABLET | Freq: Every day | ORAL | Status: DC
  Administered 2015-06-25 – 2015-06-26 (×2): 20 mg via ORAL
  Filled 2015-06-25 (×2): qty 1

## 2015-06-25 MED ORDER — ISOSORBIDE MONONITRATE ER 30 MG PO TB24
30.0000 mg | ORAL_TABLET | Freq: Every day | ORAL | Status: DC
  Administered 2015-06-25 – 2015-06-26 (×2): 30 mg via ORAL
  Filled 2015-06-25 (×2): qty 1

## 2015-06-25 MED ORDER — HEPARIN (PORCINE) IN NACL 100-0.45 UNIT/ML-% IJ SOLN: 900.0000 [IU]/h | INTRAMUSCULAR | Status: DC

## 2015-06-25 MED ORDER — SIMETHICONE 80 MG PO CHEW
80.0000 mg | CHEWABLE_TABLET | Freq: Once | ORAL | Status: AC
  Administered 2015-06-25: 80 mg via ORAL
  Filled 2015-06-25: qty 1

## 2015-06-25 MED ORDER — ACETAMINOPHEN 325 MG PO TABS: 650.0000 mg | ORAL_TABLET | ORAL | Status: DC | PRN

## 2015-06-25 MED ORDER — NORTRIPTYLINE HCL 25 MG PO CAPS
25.0000 mg | ORAL_CAPSULE | Freq: Every day | ORAL | Status: DC
  Administered 2015-06-25: 25 mg via ORAL
  Filled 2015-06-25: qty 1

## 2015-06-25 MED ORDER — AMLODIPINE BESYLATE 5 MG PO TABS
5.0000 mg | ORAL_TABLET | Freq: Every day | ORAL | Status: DC
  Administered 2015-06-25 – 2015-06-26 (×2): 5 mg via ORAL
  Filled 2015-06-25 (×3): qty 1

## 2015-06-25 MED ORDER — CLOPIDOGREL BISULFATE 75 MG PO TABS
75.0000 mg | ORAL_TABLET | Freq: Every day | ORAL | Status: DC
  Administered 2015-06-25 – 2015-06-26 (×2): 75 mg via ORAL
  Filled 2015-06-25 (×2): qty 1

## 2015-06-25 MED ORDER — GABAPENTIN 300 MG PO CAPS
300.0000 mg | ORAL_CAPSULE | Freq: Three times a day (TID) | ORAL | Status: DC
  Administered 2015-06-25 – 2015-06-26 (×3): 300 mg via ORAL
  Filled 2015-06-25 (×3): qty 1

## 2015-06-25 MED ORDER — HEPARIN (PORCINE) IN NACL 2-0.9 UNIT/ML-% IJ SOLN
INTRAMUSCULAR | Status: AC
  Filled 2015-06-25: qty 500

## 2015-06-25 MED ORDER — ASPIRIN 325 MG PO TABS
325.0000 mg | ORAL_TABLET | Freq: Every day | ORAL | Status: DC
  Administered 2015-06-25 – 2015-06-26 (×2): 325 mg via ORAL
  Filled 2015-06-25 (×2): qty 1

## 2015-06-25 MED ORDER — ASPIRIN 81 MG PO CHEW: 324.0000 mg | CHEWABLE_TABLET | ORAL | Status: DC

## 2015-06-25 MED ORDER — LIDOCAINE HCL (PF) 1 % IJ SOLN
INTRAMUSCULAR | Status: AC
  Filled 2015-06-25: qty 30

## 2015-06-25 MED ORDER — INSULIN ASPART 100 UNIT/ML ~~LOC~~ SOLN: 0.0000 [IU] | Freq: Three times a day (TID) | SUBCUTANEOUS | Status: DC

## 2015-06-25 MED ORDER — SODIUM CHLORIDE 0.9 % IV SOLN
INTRAVENOUS | Status: AC
  Administered 2015-06-25: 21:00:00 via INTRAVENOUS

## 2015-06-25 MED ORDER — INSULIN GLARGINE 100 UNIT/ML SOLOSTAR PEN: 10.0000 [IU] | PEN_INJECTOR | Freq: Every day | SUBCUTANEOUS | Status: DC

## 2015-06-25 MED ORDER — HEPARIN (PORCINE) IN NACL 100-0.45 UNIT/ML-% IJ SOLN
13.0000 [IU]/kg/h | INTRAMUSCULAR | Status: DC
  Administered 2015-06-25: 13 [IU]/kg/h via INTRAVENOUS

## 2015-06-25 MED ORDER — MIDAZOLAM HCL 2 MG/2ML IJ SOLN
INTRAMUSCULAR | Status: DC | PRN
  Administered 2015-06-25: 1 mg via INTRAVENOUS

## 2015-06-25 MED ORDER — NITROGLYCERIN 0.4 MG SL SUBL: 0.4000 mg | SUBLINGUAL_TABLET | SUBLINGUAL | Status: DC | PRN

## 2015-06-25 MED ORDER — ASPIRIN 300 MG RE SUPP
300.0000 mg | RECTAL | Status: DC
Start: 2015-06-25 — End: 2015-06-25

## 2015-06-25 MED ORDER — HEPARIN BOLUS VIA INFUSION
4000.0000 [IU] | Freq: Once | INTRAVENOUS | Status: AC
  Administered 2015-06-25: 4000 [IU] via INTRAVENOUS
  Filled 2015-06-25: qty 4000

## 2015-06-25 MED ORDER — POTASSIUM CHLORIDE CRYS ER 20 MEQ PO TBCR
40.0000 meq | EXTENDED_RELEASE_TABLET | Freq: Once | ORAL | Status: AC
  Administered 2015-06-25: 40 meq via ORAL
  Filled 2015-06-25: qty 2

## 2015-06-25 MED ORDER — CARVEDILOL 6.25 MG PO TABS
6.2500 mg | ORAL_TABLET | Freq: Two times a day (BID) | ORAL | Status: DC
  Administered 2015-06-25 – 2015-06-26 (×2): 6.25 mg via ORAL
  Filled 2015-06-25 (×2): qty 1

## 2015-06-25 MED ORDER — IOHEXOL 350 MG/ML SOLN
INTRAVENOUS | Status: DC | PRN
  Administered 2015-06-25: 90 mL via INTRACARDIAC

## 2015-06-25 MED ORDER — ASPIRIN 81 MG PO CHEW
81.0000 mg | CHEWABLE_TABLET | ORAL | Status: AC
  Administered 2015-06-25: 81 mg via ORAL
  Filled 2015-06-25: qty 1

## 2015-06-25 MED ORDER — LIDOCAINE HCL (PF) 1 % IJ SOLN
INTRAMUSCULAR | Status: DC | PRN
  Administered 2015-06-25: 17:00:00

## 2015-06-25 MED ORDER — ROSUVASTATIN CALCIUM 10 MG PO TABS
40.0000 mg | ORAL_TABLET | Freq: Every day | ORAL | Status: DC
Start: 2015-06-25 — End: 2015-06-26
  Administered 2015-06-25 – 2015-06-26 (×2): 40 mg via ORAL
  Filled 2015-06-25: qty 4
  Filled 2015-06-25: qty 1
  Filled 2015-06-25: qty 4

## 2015-06-25 MED ORDER — FENTANYL CITRATE (PF) 100 MCG/2ML IJ SOLN
INTRAMUSCULAR | Status: DC | PRN
  Administered 2015-06-25: 25 ug via INTRAVENOUS

## 2015-06-25 SURGICAL SUPPLY — 7 items
CATH INFINITI 5FR MULTPACK ANG (CATHETERS) ×3 IMPLANT
KIT HEART LEFT (KITS) ×3 IMPLANT
PACK CARDIAC CATHETERIZATION (CUSTOM PROCEDURE TRAY) ×3 IMPLANT
SHEATH PINNACLE 5F 10CM (SHEATH) ×3 IMPLANT
SYR MEDRAD MARK V 150ML (SYRINGE) ×3 IMPLANT
TRANSDUCER W/STOPCOCK (MISCELLANEOUS) ×3 IMPLANT
WIRE EMERALD 3MM-J .035X150CM (WIRE) ×3 IMPLANT

## 2015-06-25 NOTE — Progress Notes (Signed)
ANTICOAGULATION CONSULT NOTE - F/U Consult  Pharmacy Consult for heparin Indication: chest pain/ACS  No Known Allergies  Patient Measurements: Height: 5\' 5"  (165.1 cm) Weight: 167 lb 12.8 oz (76.114 kg) IBW/kg (Calculated) : 61.5 Heparin Dosing Weight: 76 kg  Vital Signs: Temp: 98.6 F (37 C) (09/29 0800) Temp Source: Oral (09/29 0800) BP: 158/88 mmHg (09/29 0325) Pulse Rate: 73 (09/29 0325)  Labs:  Recent Labs  06/22/15 1224 06/24/15 2048 06/25/15 0725 06/25/15 0940  HGB 14.7 14.0 13.7  --   HCT 45.4 43.7 42.1  --   PLT 166.0 171 161  --   HEPARINUNFRC  --   --   --  0.60  CREATININE 0.99 1.05  --   --   TROPONINI  --   --  <0.03  --     Estimated Creatinine Clearance: 66.8 mL/min (by C-G formula based on Cr of 1.05).   Medical History: Past Medical History  Diagnosis Date  . Hypertension   . Diabetes mellitus   . Stroke   . Iliac artery aneurysm, left   . Dissection of mesenteric artery     a. Mesenteric Artery Duplex (7/15):  pSMA chronic dissection with aneurysmal dilation of 1.29 cm (VVS);  b.  Chest CTA (02/26/14):  IMPRESSION:  1. No aortic dissection or other acute abnormality.  2. 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  3. Stable 17 mm ectasia of the left common iliac artery.  4. Atherosclerosis, including aortoiliac and coronary artery disease.   Marland Kitchen CAD (coronary artery disease)     a. LHC (02/26/14):  mLAD 20 (faint L>R collats), mCFX 50, pRCA 95 (plaque rupture) >> PCI:  Xience DES to pRCA (severe tortuosity of R innominate artery >> needs L radial or FA in future);  b. LHC (03/10/14):  CFX 90, oOM 70, pRCA stent patent >> PCI:  Xience DES to mCFX   . Myocardial infarction   . Hx of cardiovascular stress test     Lexiscan Myoview (2/16):  Inferior lateral defect c/w thinning vs small prior infarct, no ischemia, EF 49%, Low Risk  . Hx of echocardiogram     a.  Echocardiogram (02/27/14):  Mod focal basal and mild concentric LVH, EF 50-55%, no  RWMA, Gr 1 DD, mild TR, normal RVF  . Carotid stenosis     a. Carotid US (02/27/14):  Bilateral ICA 1-39%  . Hyperlipidemia    Assessment: 64 yo male presenting with CP x 1 - 2 weeks   PMH: CAD, chronic superior mesenteric artery dissection, hx of CVA, DM2, HTN, HLD  AC: heparin for NSTEMI/ACS. HL 0.6 on 900 units/hr   CV: hx of CAD, LHC 9/29 . Trop neg. On amlodipine, ASA 325, carvedilol, clopidogrel, lisinopril, isosorbide, rosuvastatin   Renal: SCr 1.05. K low at 3.3, replaced  Heme: H&H 13.7/42.1, Plt 161  Goal of Therapy:  Heparin level 0.3-0.7 units/ml Monitor platelets by anticoagulation protocol: Yes   Plan:  Continue heparin at 900 units/hr  Daily HL, CBC  F/U post-cath  Monitor for s/sx of bleeding  Levester Fresh, PharmD, BCPS Clinical Pharmacist Pager [phone] 06/25/2015 10:33 AM

## 2015-06-25 NOTE — H&P (Signed)
CARDIOLOGY ADMISSION/CONSULT NOTE  Assessment and Plan:  *Unstable angina: Barry [name] is a 64 year old male with history of CAD s/p LCX, RCA PCI in 2015, type II DM, HTN, HLD comes to the emergency department with 1-2 week history of progressive exertional angina, which responds to NTG. His symptoms are consistent with unstable angina. CCS III. No CHF.  -- Continue aspirin 35m daily -- Continue plavix 748mdaily -- Continue carvedilol 6.2548mID -- Continue Imdur 31m77mily.  -- NPO s midnight for LHC -- Continue crestor 40 mg daily . *HTN: well controlled -- Continue lisinopril, carvedilol and imdur  *HLD: LDL 143 -- Continue crestor 40mg33mly   Chief complaint: chest pain   HPI:  Barry [name] a 64 y.49 male with a hx of CAD, chronic superior mesenteric artery dissection, prior stroke, DM 2, HTN, HL. He originally presented in 02/2014 with non-STEMI. LHC demonstrated a ruptured plaque and thrombus in the proximal RCA which was treated with a DES. He was readmitted several weeks later with unstable angina and LHC demonstrated high-grade stenosis in the LCX which was treated with a DES. He was seen 09/2014 and complained of left-sided chest discomfort and dizziness. Lexiscan Myoview was low risk with inferior lateral defect consistent with thinning versus small prior infarct. There was no ischemia.  Pt does not speak English and therefore, all the history is obtained by talking his daughter who is at bedside. He was seen by Dr. [name] yesterday when he endorsed symptoms of substernal chest pain with minimal exertion over past 1-2 weeks. He feels that the symptoms are getting worse and are occuring with less activity. After the clinic visit, patient was asked to go to ED with any worsening in pain. He had a severe episode of substernal chest pain with radiation to bilateral upper extremities a couple of hours ago. His symptoms resided after resting and receiving  NTG paste. He denies any other symptoms of CHF such as orthopnea, PND or LE edema.  Past Medical History Past Medical History  Diagnosis Date  . Hypertension   . Diabetes mellitus   . Stroke   . Iliac artery aneurysm, left   . Dissection of mesenteric artery     a. Mesenteric Artery Duplex (7/15):  pSMA chronic dissection with aneurysmal dilation of 1.29 cm (VVS);  b.  Chest CTA (02/26/14):  IMPRESSION:  1. No aortic dissection or other acute abnormality.  2. 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  3. Stable 17 mm ectasia of the left common iliac artery.  4. Atherosclerosis, including aortoiliac and coronary artery disease.   . CADMarland Kitchen(coronary artery disease)     a. LHC (02/26/14):  mLAD 20 (faint L>R collats), mCFX 50, pRCA 95 (plaque rupture) >> PCI:  Xience DES to pRCA (severe tortuosity of R innominate artery >> needs L radial or FA in future);  b. LHC (03/10/14):  CFX 90, oOM 70, pRCA stent patent >> PCI:  Xience DES to mCFX   . Myocardial infarction   . Hx of cardiovascular stress test     Lexiscan Myoview (2/16):  Inferior lateral defect c/w thinning vs small prior infarct, no ischemia, EF 49%, Low Risk  . Hx of echocardiogram     a.  Echocardiogram (02/27/14):  Mod focal basal and mild concentric LVH, EF 50-55%, no RWMA, Gr 1 DD, mild TR, normal RVF  . Carotid stenosis     a. Carotid US ([place]/15):  Bilateral  ICA 1-39%  . Hyperlipidemia    Allergies: No Known Allergies  Social History Social History   Social History  . Marital Status: Married    Spouse Name: N/A  . Number of Children: 3  . Years of Education: N/A   Occupational History  . Retired    Social History Main Topics  . Smoking status: Former Smoker    Quit date: 06/09/2009  . Smokeless tobacco: Never Used  . Alcohol Use: No  . Drug Use: No  . Sexual Activity: Not Currently   Other Topics Concern  . Not on file   Social History Narrative   Marital: married.      Lives: with son,wife.        Children: 3 children; 6 grandchildren      Employed: unemployed; disability unknown reason/CVA L sided weakness      Tobacco:  Quit 2012; smoked 40 years.       Alcohol: no drinking now; social in past.       Drugs: none       Exercise: sporadic.       ADLs:  No driving since CVA.    Family History Family History  Problem Relation Age of Onset  . Diabetes Father   . Hypertension Father   . Heart attack Father   . Stroke Neg Hx    Current Facility-Administered Medications  Medication Dose Route Frequency Provider Last Rate Last Dose  . aspirin tablet 325 mg  325 mg Oral Daily Ivin Booty, MD   325 mg at 06/25/15 0130   Current Outpatient Prescriptions  Medication Sig Dispense Refill  . aspirin 81 MG EC tablet Take 1 tablet (81 mg total) by mouth daily.    . carvedilol (COREG) 6.25 MG tablet Take 1 tablet (6.25 mg total) by mouth 2 (two) times daily with a meal. 60 tablet 3  . clopidogrel (PLAVIX) 75 MG tablet Take 1 tablet (75 mg total) by mouth daily with breakfast. 90 tablet 1  . gabapentin (NEURONTIN) 300 MG capsule Take 1 capsule (300 mg total) by mouth 3 (three) times daily. And 2 tabs as needed at night for leg pain 90 capsule 4  . isosorbide mononitrate (IMDUR) 30 MG 24 hr tablet Take 1 tablet (30 mg total) by mouth daily. 30 tablet 11  . Liraglutide 18 MG/3ML SOPN Inject 0.72m daily for 1 week & then increase to 1.259mdaily. (Patient taking differently: Inject 1.2 mg into the skin daily. ) 6 mL 3  . lisinopril (PRINIVIL,ZESTRIL) 20 MG tablet Take 1 tablet (20 mg total) by mouth daily. 30 tablet 11  . nitroGLYCERIN (NITROSTAT) 0.4 MG SL tablet Place 1 tablet (0.4 mg total) under the tongue every 5 (five) minutes as needed for chest pain. 25 tablet 1  . rosuvastatin (CRESTOR) 40 MG tablet Take 1 tablet (40 mg total) by mouth daily. 90 tablet 0  . amLODipine (NORVASC) 5 MG tablet Take 1 tablet (5 mg total) by mouth daily. (Patient not taking: Reported on 06/24/2015) 30 tablet 11    . Blood Glucose Monitoring Suppl (ONE TOUCH ULTRA SYSTEM KIT) W/DEVICE KIT 1 kit by Does not apply route once. One touch delica lancets, and meter Pt will test once daily. Dx 250.92 1 each 0  . docusate sodium (COLACE) 100 MG capsule Take 1 capsule (100 mg total) by mouth daily. For constipation (Patient not taking: Reported on 06/24/2015) 90 capsule 1  . glucose blood (ONE TOUCH ULTRA TEST) test strip Test blood sugar  3 times a day. Dx code: E11.8 100 each 10  . Insulin Glargine (LANTUS SOLOSTAR) 100 UNIT/ML Solostar Pen Inject 10 Units into the skin daily. (Patient not taking: Reported on 06/24/2015) 5 pen PRN  . Insulin Pen Needle (NOVOTWIST) 32G X 5 MM MISC Use 2 per day to inject lantus and victoza 90 each 3  . Lancets (ONETOUCH ULTRASOFT) lancets Test blood sugar 3 times daily. Dx code: E11.8 100 each 12  . nortriptyline (PAMELOR) 25 MG capsule Take 1 capsule (25 mg total) by mouth at bedtime. (Patient not taking: Reported on 06/24/2015) 30 capsule 3  . pantoprazole (PROTONIX) 40 MG tablet Take 1 tablet (40 mg total) by mouth daily. (Patient not taking: Reported on 06/24/2015) 30 tablet 3  . polyethylene glycol powder (GLYCOLAX/MIRALAX) powder Take 17 g by mouth 2 (two) times daily as needed for mild constipation. (Patient not taking: Reported on 06/24/2015) 500 g 11  . terbinafine (LAMISIL) 1 % cream Apply 1 application topically 2 (two) times daily. To feet for a month (Patient not taking: Reported on 06/24/2015) 42 g 3   Physical Exam Filed Vitals:   06/24/15 2300  BP: 131/84  Pulse: 82  Temp:   Resp: 14    Gen: Comfortable appearing in NAD HEENT: EOMI, MMM Neck: Supple, no JVD, no carotid bruits CV:RRR, no murmurs, normal S1,S2, +2 pulses in bilateral UE Pulm: CTAB Abdomen: soft, NT/ND Ext: No c/c/e Skin: Multiple tattoos  Labs:  Results for orders placed or performed during the hospital encounter of 06/24/15 (from the past 24 hour(s))  I-stat troponin, ED     Status: None    Collection Time: 06/24/15  8:47 PM  Result Value Ref Range   Troponin i, poc 0.00 0.00 - 0.08 ng/mL   Comment 3          Basic metabolic panel     Status: Abnormal   Collection Time: 06/24/15  8:48 PM  Result Value Ref Range   Sodium 140 135 - 145 mmol/L   Potassium 3.3 (L) 3.5 - 5.1 mmol/L   Chloride 106 101 - 111 mmol/L   CO2 25 22 - 32 mmol/L   Glucose, Bld 173 (H) 65 - 99 mg/dL   BUN 13 6 - 20 mg/dL   Creatinine, Ser 1.05 0.61 - 1.24 mg/dL   Calcium 9.0 8.9 - 10.3 mg/dL   GFR calc non Af Amer >60 >60 mL/min   GFR calc Af Amer >60 >60 mL/min   Anion gap 9 5 - 15  CBC     Status: None   Collection Time: 06/24/15  8:48 PM  Result Value Ref Range   WBC 7.7 4.0 - 10.5 K/uL   RBC 4.95 4.22 - 5.81 MIL/uL   Hemoglobin 14.0 13.0 - 17.0 g/dL   HCT 43.7 39.0 - 52.0 %   MCV 88.3 78.0 - 100.0 fL   MCH 28.3 26.0 - 34.0 pg   MCHC 32.0 30.0 - 36.0 g/dL   RDW 13.0 11.5 - 15.5 %   Platelets 171 150 - 400 K/uL

## 2015-06-25 NOTE — ED Notes (Signed)
Paged Dr. Posey Pronto (Cardiologist), informed him of pts chest pain now a 4 and that Tele could not take the pt.  He will order a I stat trop and will change the bed order.  He also requested that the heparin drip be started.

## 2015-06-25 NOTE — Progress Notes (Signed)
Patient Name: Barry [name] Date of Encounter: 06/25/2015  Pt. Profile: Mr. Wasco is a 64 year old male with history of CAD s/p LCX, RCA PCI in 2015, type II DM, HTN, HLD comes to the emergency department with 1-2 week history of progressive exertional angina, which responds to NTG.   SUBJECTIVE  Now pain while at rest. Currently 1/10 chest pressure. This pain is similar to previous cardiac pain. No SOB. He does not speak english, daughter at bed side for translation.   CURRENT MEDS . amLODipine  5 mg Oral Daily  . aspirin  325 mg Oral Daily  . carvedilol  6.25 mg Oral BID WC  . clopidogrel  75 mg Oral Q breakfast  . docusate sodium  100 mg Oral Daily  . gabapentin  300 mg Oral TID  . isosorbide mononitrate  30 mg Oral Daily  . Liraglutide  1.2 mg Subcutaneous Daily  . lisinopril  20 mg Oral Daily  . nortriptyline  25 mg Oral QHS  . pantoprazole  40 mg Oral Daily  . potassium chloride  40 mEq Oral Once  . rosuvastatin  40 mg Oral Daily    OBJECTIVE  Filed Vitals:   06/25/15 0300 06/25/15 0325 06/25/15 0326 06/25/15 0800  BP: 130/75 158/88    Pulse: 70 73    Temp:   98 F (36.7 C) 98.6 F (37 C)  TempSrc:   Oral Oral  Resp: 12 18    Height:   5\' 5"  (1.651 m)   Weight:   167 lb 12.8 oz (76.114 kg)   SpO2: 94% 98%     No intake or output data in the 24 hours ending 06/25/15 0840 Filed Weights   06/24/15 2022 06/25/15 0326  Weight: 168 lb (76.204 kg) 167 lb 12.8 oz (76.114 kg)    PHYSICAL EXAM  General: Pleasant, NAD. Neuro: Alert and oriented X 3. Moves all extremities spontaneously. Psych: Normal affect. HEENT:  Normal  Neck: Supple without bruits or JVD. Lungs:  Resp regular and unlabored, CTA. Heart: RRR no s3, s4, or murmurs. Abdomen: Soft, non-tender, non-distended, BS + x 4.  Extremities: No clubbing, cyanosis or edema. DP/PT/Radials 2+ and equal bilaterally.  Accessory Clinical Findings  CBC  Recent Labs  06/22/15 1224 06/24/15 2048 06/25/15 0725    WBC 7.3 7.7 8.0  NEUTROABS 5.2  --   --   HGB 14.7 14.0 13.7  HCT 45.4 43.7 42.1  MCV 88.2 88.3 88.3  PLT 166.0 171 250   Basic Metabolic Panel  Recent Labs  06/22/15 1224 06/24/15 2048  NA 139 140  K 3.8 3.3*  CL 106 106  CO2 25 25  GLUCOSE 143* 173*  BUN 18 13  CREATININE 0.99 1.05  CALCIUM 9.0 9.0  Cardiac Enzymes  Recent Labs  06/25/15 0725  TROPONINI <0.03    TELE  NSR  Radiology/Studies  Dg Chest 2 View  06/24/2015   CLINICAL DATA:  Left chest pain today.  EXAM: CHEST  2 VIEW  COMPARISON:  October 13, 2014  FINDINGS: The heart size and mediastinal contours are within normal limits. There is no focal infiltrate, pulmonary edema, or pleural effusion. The visualized skeletal structures are stable.  IMPRESSION: No active cardiopulmonary disease.   Electronically Signed   By: Abelardo Diesel M.D.   On: 06/24/2015 21:11   Echo 02/2014 LV EF: 50% -  55%  ------------------------------------------------------------------- Indications:   Chest pain 786.51. TIA 435.9.  ------------------------------------------------------------------- History:  Risk factors: Hypertension. Diabetes  mellitus.  ------------------------------------------------------------------- Study Conclusions  - Left ventricle: The cavity size was normal. There was moderate focal basal and mild concentric hypertrophy of the left ventricle. Systolic function was normal. The estimated ejection fraction was in the range of 50% to 55%. Wall motion was normal; there were no regional wall motion abnormalities. Doppler parameters are consistent with abnormal left ventricular relaxation (grade 1 diastolic dysfunction). - Aortic valve: Trileaflet; mildly thickened, mildly calcified leaflets. There was trivial regurgitation. - Aortic root: The aortic root was normal in size. - Left atrium: The atrium was normal in size. - Right ventricle: Systolic function was normal. - Right  atrium: The atrium was normal in size. - Tricuspid valve: There was mild regurgitation. - Pulmonary arteries: Systolic pressure was within the normal range.  Impressions:  - There was moderate focal basal and mild concentric hypertrophy of the left ventricle. Normal left ventricular size and function. No significant valvular abnormality. Normal RV function and RVSP.  ASSESSMENT AND PLAN  1. Unstable angina - History of CAD s/p LCX, RCA PCI in 2015. His pain is nitro responsive. POC trop x 2 negative. Trop x 1 negative.  -Echo 02/2014 showed LV EF of 50-55%, grade 1 DD, mild LVH.  Carlton Adam Myoview 10/2014 was low risk with inferior lateral defect consistent with thinning versus small prior infarct. There was no ischemia. - continue ASA, plavix, coreg, statin, ACE,  imdur and heparin. He is npo. Likely cath. Will discuss with MD.   2. HTN - minimally elevated this morning, otherwise stable. Continue to monitor.   3. HL - Continue statin - 04/20/2015 LDL 101  4. Hypokalemia - Will give suppliment  5. DM - HgbA1C 6.9 04/03/15 -Will place on SSI  Signed, Bhagat,Bhavinkumar PA-C Pager [phone]  Agree with note by Robbie Lis PA-C  Known CAD now with accelerated [place]. No acute EKG changes. Enz neg. Exam benign. Labs OK. On IV hep. Pt is [place] and will need a Optometrist. For cath today. His daughter was in the room and translated during my visit.   Lorretta Harp, M.D., [place], [facility], [place] [address] [place], [place]  [address]  [phone] 06/25/2015 9:19 AM

## 2015-06-25 NOTE — H&P (View-Only) (Signed)
Patient Name: Barry [name] Date of Encounter: 06/25/2015  Pt. Profile: Mr. Base is a 64 year old male with history of CAD s/p LCX, RCA PCI in 2015, type II DM, HTN, HLD comes to the emergency department with 1-2 week history of progressive exertional angina, which responds to NTG.   SUBJECTIVE  Now pain while at rest. Currently 1/10 chest pressure. This pain is similar to previous cardiac pain. No SOB. He does not speak english, daughter at bed side for translation.   CURRENT MEDS . amLODipine  5 mg Oral Daily  . aspirin  325 mg Oral Daily  . carvedilol  6.25 mg Oral BID WC  . clopidogrel  75 mg Oral Q breakfast  . docusate sodium  100 mg Oral Daily  . gabapentin  300 mg Oral TID  . isosorbide mononitrate  30 mg Oral Daily  . Liraglutide  1.2 mg Subcutaneous Daily  . lisinopril  20 mg Oral Daily  . nortriptyline  25 mg Oral QHS  . pantoprazole  40 mg Oral Daily  . potassium chloride  40 mEq Oral Once  . rosuvastatin  40 mg Oral Daily    OBJECTIVE  Filed Vitals:   06/25/15 0300 06/25/15 0325 06/25/15 0326 06/25/15 0800  BP: 130/75 158/88    Pulse: 70 73    Temp:   98 F (36.7 C) 98.6 F (37 C)  TempSrc:   Oral Oral  Resp: 12 18    Height:   5\' 5"  (1.651 m)   Weight:   167 lb 12.8 oz (76.114 kg)   SpO2: 94% 98%     No intake or output data in the 24 hours ending 06/25/15 0840 Filed Weights   06/24/15 2022 06/25/15 0326  Weight: 168 lb (76.204 kg) 167 lb 12.8 oz (76.114 kg)    PHYSICAL EXAM  General: Pleasant, NAD. Neuro: Alert and oriented X 3. Moves all extremities spontaneously. Psych: Normal affect. HEENT:  Normal  Neck: Supple without bruits or JVD. Lungs:  Resp regular and unlabored, CTA. Heart: RRR no s3, s4, or murmurs. Abdomen: Soft, non-tender, non-distended, BS + x 4.  Extremities: No clubbing, cyanosis or edema. DP/PT/Radials 2+ and equal bilaterally.  Accessory Clinical Findings  CBC  Recent Labs  06/22/15 1224 06/24/15 2048 06/25/15 0725    WBC 7.3 7.7 8.0  NEUTROABS 5.2  --   --   HGB 14.7 14.0 13.7  HCT 45.4 43.7 42.1  MCV 88.2 88.3 88.3  PLT 166.0 171 034   Basic Metabolic Panel  Recent Labs  06/22/15 1224 06/24/15 2048  NA 139 140  K 3.8 3.3*  CL 106 106  CO2 25 25  GLUCOSE 143* 173*  BUN 18 13  CREATININE 0.99 1.05  CALCIUM 9.0 9.0  Cardiac Enzymes  Recent Labs  06/25/15 0725  TROPONINI <0.03    TELE  NSR  Radiology/Studies  Dg Chest 2 View  06/24/2015   CLINICAL DATA:  Left chest pain today.  EXAM: CHEST  2 VIEW  COMPARISON:  October 13, 2014  FINDINGS: The heart size and mediastinal contours are within normal limits. There is no focal infiltrate, pulmonary edema, or pleural effusion. The visualized skeletal structures are stable.  IMPRESSION: No active cardiopulmonary disease.   Electronically Signed   By: Abelardo Diesel M.D.   On: 06/24/2015 21:11   Echo 02/2014 LV EF: 50% -  55%  ------------------------------------------------------------------- Indications:   Chest pain 786.51. TIA 435.9.  ------------------------------------------------------------------- History:  Risk factors: Hypertension. Diabetes  mellitus.  ------------------------------------------------------------------- Study Conclusions  - Left ventricle: The cavity size was normal. There was moderate focal basal and mild concentric hypertrophy of the left ventricle. Systolic function was normal. The estimated ejection fraction was in the range of 50% to 55%. Wall motion was normal; there were no regional wall motion abnormalities. Doppler parameters are consistent with abnormal left ventricular relaxation (grade 1 diastolic dysfunction). - Aortic valve: Trileaflet; mildly thickened, mildly calcified leaflets. There was trivial regurgitation. - Aortic root: The aortic root was normal in size. - Left atrium: The atrium was normal in size. - Right ventricle: Systolic function was normal. - Right  atrium: The atrium was normal in size. - Tricuspid valve: There was mild regurgitation. - Pulmonary arteries: Systolic pressure was within the normal range.  Impressions:  - There was moderate focal basal and mild concentric hypertrophy of the left ventricle. Normal left ventricular size and function. No significant valvular abnormality. Normal RV function and RVSP.  ASSESSMENT AND PLAN  1. Unstable angina - History of CAD s/p LCX, RCA PCI in 2015. His pain is nitro responsive. POC trop x 2 negative. Trop x 1 negative.  -Echo 02/2014 showed LV EF of 50-55%, grade 1 DD, mild LVH.  Carlton Adam Myoview 10/2014 was low risk with inferior lateral defect consistent with thinning versus small prior infarct. There was no ischemia. - continue ASA, plavix, coreg, statin, ACE,  imdur and heparin. He is npo. Likely cath. Will discuss with MD.   2. HTN - minimally elevated this morning, otherwise stable. Continue to monitor.   3. HL - Continue statin - 04/20/2015 LDL 101  4. Hypokalemia - Will give suppliment  5. DM - HgbA1C 6.9 04/03/15 -Will place on SSI  Signed, Bhagat,Bhavinkumar PA-C Pager [phone]  Agree with note by Robbie Lis PA-C  Known CAD now with accelerated [place]. No acute EKG changes. Enz neg. Exam benign. Labs OK. On IV hep. Pt is [place] and will need a Optometrist. For cath today. His daughter was in the room and translated during my visit.   Lorretta Harp, M.D., [place], [facility], [place] [address] [place], [place]  [address]  [phone] 06/25/2015 9:19 AM

## 2015-06-25 NOTE — Progress Notes (Signed)
Site area:right groin a 5 french arterial sheath was removed  Site Prior to Removal:  Level 0  Pressure Applied For 15 MINUTES    Minutes Beginning at 1730p  Manual:   Yes.    Patient Status During Pull:  stable  Post Pull Groin Site:  Level 0  Post Pull Instructions Given:  Yes.   thru an interpreter.  Post Pull Pulses Present:  Yes.    Dressing Applied:  Yes.    Comments:  Pt VS remain stable during sheath pull.  Pt denies any discomfort at this time per interpreter.

## 2015-06-25 NOTE — Progress Notes (Signed)
ANTICOAGULATION CONSULT NOTE - Initial Consult  Pharmacy Consult for Heparin  Indication: chest pain/ACS  No Known Allergies  Patient Measurements: Height: 5' 4.96" (165 cm) Weight: 168 lb (76.204 kg) IBW/kg (Calculated) : 61.41  Vital Signs: Temp: 98.8 F (37.1 C) (09/28 2022) Temp Source: Oral (09/28 2022) BP: 139/83 mmHg (09/29 0130) Pulse Rate: 77 (09/29 0130)  Labs:  Recent Labs  06/22/15 1224 06/24/15 2048  HGB 14.7 14.0  HCT 45.4 43.7  PLT 166.0 171  CREATININE 0.99 1.05    Estimated Creatinine Clearance: 66.8 mL/min (by C-G formula based on Cr of 1.05).   Medical History: Past Medical History  Diagnosis Date  . Hypertension   . Diabetes mellitus   . Stroke   . Iliac artery aneurysm, left   . Dissection of mesenteric artery     a. Mesenteric Artery Duplex (7/15):  pSMA chronic dissection with aneurysmal dilation of 1.29 cm (VVS);  b.  Chest CTA (02/26/14):  IMPRESSION:  1. No aortic dissection or other acute abnormality.  2. 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  3. Stable 17 mm ectasia of the left common iliac artery.  4. Atherosclerosis, including aortoiliac and coronary artery disease.   Marland Kitchen CAD (coronary artery disease)     a. LHC (02/26/14):  mLAD 20 (faint L>R collats), mCFX 50, pRCA 95 (plaque rupture) >> PCI:  Xience DES to pRCA (severe tortuosity of R innominate artery >> needs L radial or FA in future);  b. LHC (03/10/14):  CFX 90, oOM 70, pRCA stent patent >> PCI:  Xience DES to mCFX   . Myocardial infarction   . Hx of cardiovascular stress test     Lexiscan Myoview (2/16):  Inferior lateral defect c/w thinning vs small prior infarct, no ischemia, EF 49%, Low Risk  . Hx of echocardiogram     a.  Echocardiogram (02/27/14):  Mod focal basal and mild concentric LVH, EF 50-55%, no RWMA, Gr 1 DD, mild TR, normal RVF  . Carotid stenosis     a. Carotid US (02/27/14):  Bilateral ICA 1-39%  . Hyperlipidemia     Assessment: 1-2 weeks of exertional  angina, possible cath today, starting heparin, CBC/renal function good, other labs/meds reviewed.   Goal of Therapy:  Heparin level 0.3-0.7 units/ml Monitor platelets by anticoagulation protocol: Yes   Plan:  -Heparin 4000 units BOLUS -Start heparin drip at 900 units/hr -1000 HL -Daily CBC/HL -Monitor for bleeding  Narda Bonds 06/25/2015,1:45 AM

## 2015-06-25 NOTE — Interval H&P Note (Signed)
History and Physical Interval Note:  06/25/2015 4:46 PM  Miles Woolston  has presented today for cardiac cath with the diagnosis of unstable angina  The various methods of treatment have been discussed with the patient and family. After consideration of risks, benefits and other options for treatment, the patient has consented to  Procedure(s): Left Heart Cath and Coronary Angiography (N/A) as a surgical intervention .  The patient's history has been reviewed, patient examined, no change in status, stable for surgery.  I have reviewed the patient's chart and labs.  Questions were answered to the patient's satisfaction.    Cath Lab Visit (complete for each Cath Lab visit)  Clinical Evaluation Leading to the Procedure:   ACS: No.  Non-ACS:    Anginal Classification: CCS III  Anti-ischemic medical therapy: Maximal Therapy (2 or more classes of medications)  Non-Invasive Test Results: No non-invasive testing performed  Prior CABG: No previous CABG         MCALHANY,CHRISTOPHER

## 2015-06-26 ENCOUNTER — Encounter (HOSPITAL_COMMUNITY): Payer: Self-pay | Admitting: Cardiovascular Disease

## 2015-06-26 DIAGNOSIS — I209 Angina pectoris, unspecified: Secondary | ICD-10-CM | POA: Diagnosis not present

## 2015-06-26 DIAGNOSIS — I2510 Atherosclerotic heart disease of native coronary artery without angina pectoris: Secondary | ICD-10-CM | POA: Diagnosis not present

## 2015-06-26 LAB — CBC
HCT: 41.1 % (ref 39.0–52.0)
Hemoglobin: 13.4 g/dL (ref 13.0–17.0)
MCH: 29 pg (ref 26.0–34.0)
MCHC: 32.6 g/dL (ref 30.0–36.0)
MCV: 89 fL (ref 78.0–100.0)
Platelets: 163 10*3/uL (ref 150–400)
RBC: 4.62 MIL/uL (ref 4.22–5.81)
RDW: 13.3 % (ref 11.5–15.5)
WBC: 6.1 10*3/uL (ref 4.0–10.5)

## 2015-06-26 LAB — BASIC METABOLIC PANEL
Anion gap: 7 (ref 5–15)
BUN: 12 mg/dL (ref 6–20)
CO2: 26 mmol/L (ref 22–32)
Calcium: 8.7 mg/dL — ABNORMAL LOW (ref 8.9–10.3)
Chloride: 103 mmol/L (ref 101–111)
Creatinine, Ser: 1.18 mg/dL (ref 0.61–1.24)
GFR calc Af Amer: >60 mL/min (ref >60)
GFR calc non Af Amer: >60 mL/min (ref >60)
Glucose, Bld: 171 mg/dL — ABNORMAL HIGH (ref 65–99)
Potassium: 3.8 mmol/L (ref 3.5–5.1)
Sodium: 136 mmol/L (ref 135–145)

## 2015-06-26 LAB — GLUCOSE, CAPILLARY: Glucose-Capillary: 145 mg/dL — ABNORMAL HIGH (ref 65–99)

## 2015-06-26 NOTE — Discharge Summary (Signed)
Discharge Summary   Patient ID: Barry [name],  MRN: [id number], DOB/AGE: 06-08-1951 64 y.o.  Admit date: 06/24/2015 Discharge date: 06/26/2015  Primary Care Provider: COPLAND,JESSICA Primary Cardiologist: Dr. Aundra Dubin  Discharge Diagnoses Active Problems:   Chest pain   Pain in the chest   Allergies No Known Allergies  Procedures Cath 06/25/15 Conclusion     Ost 2nd Mrg to 2nd Mrg lesion with previously placed drug-eluting stent with minimal restenosis.  Lat 2nd Mrg lesion, 80% stenosed. Jailed by old stent. Unchanged from last cath.  Prox Cx lesion, 20% stenosed.  Ost LAD lesion, 30% stenosed.  Prox LAD to Mid LAD lesion, 30% stenosed.  Dist LAD lesion, 30% stenosed.  1st Diag lesion, 30% stenosed.  Ost RCA to Prox RCA lesion, 15% stenosed. The lesion was previously treated with a stent (unknown type) .  Mid RCA to Dist RCA lesion, 20% stenosed.  Dist RCA lesion, 20% stenosed.  Ost RPDA lesion, 60% stenosed. Small to moderate caliber vessel. Unchanged from last cath.  The left ventricular systolic function is normal.  1. Double vessel CAD with patent stent mid Circumflex/OM2 and patent stent RCA 2. Moderate stenosis in the small caliber sub-branch of OM2 where it is jailed by the old stent. This is a small caliber branch vessel. Unchanged in appearance from last cath in 2015.  3. Moderate stenosis in the small to moderate caliber ostial PDA, unchanged from last cath in 2015.  4. Normal LV function  Recommendations: Continue medical therapy for CAD.       History of Present Illness  Barry [name] is a 64 y.o. male with a hx of CAD, chronic superior mesenteric artery dissection, prior stroke, DM 2, HTN, HL who presented to [facility] ED @ late evening of 06/24/15.  He originally presented in 02/2014 with non-STEMI. LHC demonstrated a ruptured plaque and thrombus in the proximal RCA which was treated with a DES. He was readmitted several weeks later with unstable angina  and LHC demonstrated high-grade stenosis in the LCX which was treated with a DES. He was seen 09/2014 and complained of left-sided chest discomfort and dizziness. Lexiscan Myoview was low risk with inferior lateral defect consistent with thinning versus small prior infarct. There was no ischemia.  Pt does not speak English and therefore, all the history is obtained by talking his daughter who was at bedside. He was seen by Richardson Dopp in the Cardiology clinic 9/25 when he endorsed symptoms of substernal chest pain with minimal exertion over past 1-2 weeks. He feels that the symptoms are getting worse and are occuring with less activity. After the clinic visit, patient was asked to go to ED with any worsening in pain. However he did not came at that time. He had a severe episode of substernal chest pain with radiation to bilateral upper extremities a couple of hours ago prior to presention 9/28. His symptoms resided after resting and receiving NTG paste. He denies any other symptoms of CHF such as orthopnea, PND or LE edema.  In ED, his exertional angina responded to NTG. EKG non ischemic.  Echo 02/2014 showed LV EF of 50-55%, grade 1 DD, mild LVH.   Hospital Course  He was admitted with unstable angina @ midnight. His home medication was continued and IV heparin was started. In, morning patient continued to have a mild substernal BP which was similar to previous cardiac pain. His trop was negative and EKG was non ischemic. Given his symptoms at rest, plan made for cath  same day. Cath showed patent stents with non critical CAD. Medical management recommended. He ambulated without any CP. Post cath creatinine was stable.   She has been seen by Dr.Berrry today and deemed ready for discharge home. All follow-up appointments have been scheduled. Discharge medications are listed below.   He was advice to f/u wit PCP for high blood sugar. HgbA1C 6.9 04/03/15  Discharge Vitals Blood pressure 127/78, pulse  81, temperature 98.2 F (36.8 C), temperature source Oral, resp. rate 14, height $RemoveBe'5\' 5"'oJpgukcHB$  (1.651 m), weight 167 lb 8 oz (75.978 kg), SpO2 96 %.  Filed Weights   06/24/15 2022 06/25/15 0326 06/26/15 0400  Weight: 168 lb (76.204 kg) 167 lb 12.8 oz (76.114 kg) 167 lb 8 oz (75.978 kg)    Labs  CBC  Recent Labs  06/25/15 0725 06/26/15 0532  WBC 8.0 6.1  HGB 13.7 13.4  HCT 42.1 41.1  MCV 88.3 89.0  PLT 161 546   Basic Metabolic Panel  Recent Labs  06/25/15 1045 06/26/15 0532  NA 139 136  K 3.7 3.8  CL 106 103  CO2 26 26  GLUCOSE 120* 171*  BUN 13 12  CREATININE 1.07 1.18  CALCIUM 9.0 8.7*   Cardiac Enzymes  Recent Labs  06/25/15 0725 06/25/15 1540  TROPONINI <0.03 <0.03    Disposition  Pt is being discharged home today in good condition.  Follow-up Plans & Appointments  Follow-up Information    Follow up with Richardson Dopp, PA-C. Go on 07/17/2015.   Specialties:  Physician Assistant, Radiology, Interventional Cardiology   Why:  $Rem'@12'ochR$ :10 for cardiology f/u   Contact information:   [address] [address] [phone]       Follow up with Lamar Blinks, MD. Schedule an appointment as soon as possible for a visit in 1 week.   Specialty:  Family Medicine   Why:  for hyperglycemia   Contact information:   [place] [address] [phone]       Discharge Instructions    Call MD for:  redness, tenderness, or signs of infection (pain, swelling, redness, odor or green/yellow discharge around incision site)    Complete by:  As directed      Diet - low sodium heart healthy    Complete by:  As directed      Increase activity slowly    Complete by:  As directed            F/u Labs/Studies: None  Discharge Medications    Medication List    TAKE these medications        amLODipine 5 MG tablet  Commonly known as:  NORVASC  Take 1 tablet (5 mg total) by mouth daily.     aspirin 81 MG EC tablet  Take 1  tablet (81 mg total) by mouth daily.     carvedilol 6.25 MG tablet  Commonly known as:  COREG  Take 1 tablet (6.25 mg total) by mouth 2 (two) times daily with a meal.     clopidogrel 75 MG tablet  Commonly known as:  PLAVIX  Take 1 tablet (75 mg total) by mouth daily with breakfast.     docusate sodium 100 MG capsule  Commonly known as:  COLACE  Take 1 capsule (100 mg total) by mouth daily. For constipation     gabapentin 300 MG capsule  Commonly known as:  NEURONTIN  Take 1 capsule (300 mg total) by mouth 3 (three) times daily. And  2 tabs as needed at night for leg pain     glucose blood test strip  Commonly known as:  ONE TOUCH ULTRA TEST  Test blood sugar 3 times a day. Dx code: E11.8     Insulin Pen Needle 32G X 5 MM Misc  Commonly known as:  NOVOTWIST  Use 2 per day to inject lantus and victoza     isosorbide mononitrate 30 MG 24 hr tablet  Commonly known as:  IMDUR  Take 1 tablet (30 mg total) by mouth daily.     Liraglutide 18 MG/3ML Sopn  Inject 0.26m daily for 1 week & then increase to 1.212mdaily.     lisinopril 20 MG tablet  Commonly known as:  PRINIVIL,ZESTRIL  Take 1 tablet (20 mg total) by mouth daily.     nitroGLYCERIN 0.4 MG SL tablet  Commonly known as:  NITROSTAT  Place 1 tablet (0.4 mg total) under the tongue every 5 (five) minutes as needed for chest pain.     nortriptyline 25 MG capsule  Commonly known as:  PAMELOR  Take 1 capsule (25 mg total) by mouth at bedtime.     ONE TOUCH ULTRA SYSTEM KIT W/DEVICE Kit  1 kit by Does not apply route once. One touch delica lancets, and meter Pt will test once daily. Dx 250.92     onetouch ultrasoft lancets  Test blood sugar 3 times daily. Dx code: E11.8     pantoprazole 40 MG tablet  Commonly known as:  PROTONIX  Take 1 tablet (40 mg total) by mouth daily.     polyethylene glycol powder powder  Commonly known as:  GLYCOLAX/MIRALAX  Take 17 g by mouth 2 (two) times daily as needed for mild  constipation.     rosuvastatin 40 MG tablet  Commonly known as:  CRESTOR  Take 1 tablet (40 mg total) by mouth daily.     terbinafine 1 % cream  Commonly known as:  LAMISIL  Apply 1 application topically 2 (two) times daily. To feet for a month        Duration of Discharge Encounter   Greater than 30 minutes including physician time.  Signed, Syvilla Martin PA-C 06/26/2015, 10:07 AM

## 2015-06-26 NOTE — Discharge Instructions (Signed)
Radial Site Care °Refer to this sheet in the next few weeks. These instructions provide you with information on caring for yourself after your procedure. Your caregiver may also give you more specific instructions. Your treatment has been planned according to current medical practices, but problems sometimes occur. Call your caregiver if you have any problems or questions after your procedure. °HOME CARE INSTRUCTIONS °· You may shower the day after the procedure. Remove the bandage (dressing) and gently wash the site with plain soap and water. Gently pat the site dry. °· Do not apply powder or lotion to the site. °· Do not submerge the affected site in water for 3 to 5 days. °· Inspect the site at least twice daily. °· Do not flex or bend the affected arm for 24 hours. °· No lifting over 5 pounds (2.3 kg) for 5 days after your procedure. °· Do not drive home if you are discharged the same day of the procedure. Have someone else drive you. °· You may drive 24 hours after the procedure unless otherwise instructed by your caregiver. °· Do not operate machinery or power tools for 24 hours. °· A responsible adult should be with you for the first 24 hours after you arrive home. °What to expect: °· Any bruising will usually fade within 1 to 2 weeks. °· Blood that collects in the tissue (hematoma) may be painful to the touch. It should usually decrease in size and tenderness within 1 to 2 weeks. °SEEK IMMEDIATE MEDICAL CARE IF: °· You have unusual pain at the radial site. °· You have redness, warmth, swelling, or pain at the radial site. °· You have drainage (other than a small amount of blood on the dressing). °· You have chills. °· You have a fever or persistent symptoms for more than 72 hours. °· You have a fever and your symptoms suddenly get worse. °· Your arm becomes pale, cool, tingly, or numb. °· You have heavy bleeding from the site. Hold pressure on the site. °Document Released: 10/15/2010 Document Revised:  12/05/2011 Document Reviewed: 10/15/2010 °ExitCare® Patient Information ©2015 [employer]. This information is not intended to replace advice given to you by your health care provider. Make sure you discuss any questions you have with your health care provider. ° °

## 2015-06-26 NOTE — Progress Notes (Signed)
Patient Name: Barry [name] Date of Encounter: 06/26/2015  Pt. Profile: Barry [name] is a 64 year old male with history of CAD s/p LCX, RCA PCI in 2015, type II DM, HTN, HLD comes to the emergency department with 1-2 week history of progressive exertional angina, which responds to NTG.   SUBJECTIVE  Feeling well. No chest pain, sob or palpitations. Ambulating in room without CP. Will ambulate in hallway today.   CURRENT MEDS . amLODipine  5 mg Oral Daily  . aspirin  325 mg Oral Daily  . carvedilol  6.25 mg Oral BID WC  . clopidogrel  75 mg Oral Q breakfast  . docusate sodium  100 mg Oral Daily  . gabapentin  300 mg Oral TID  . insulin aspart  0-9 Units Subcutaneous TID WC  . isosorbide mononitrate  30 mg Oral Daily  . lisinopril  20 mg Oral Daily  . nortriptyline  25 mg Oral QHS  . pantoprazole  40 mg Oral Daily  . rosuvastatin  40 mg Oral Daily  . sodium chloride  3 mL Intravenous Q12H    OBJECTIVE  Filed Vitals:   06/25/15 2020 06/25/15 2339 06/25/15 2340 06/26/15 0400  BP: 124/65  132/73 110/67  Pulse: 73  71 81  Temp:  98.2 F (36.8 C)  98.2 F (36.8 C)  TempSrc:  Oral  Oral  Resp: 13  14 14   Height:      Weight:    167 lb 8 oz (75.978 kg)  SpO2: 96%  96% 96%    Intake/Output Summary (Last 24 hours) at 06/26/15 0942 Last data filed at 06/25/15 2200  Gross per 24 hour  Intake    240 ml  Output    575 ml  Net   -335 ml   Filed Weights   06/24/15 2022 06/25/15 0326 06/26/15 0400  Weight: 168 lb (76.204 kg) 167 lb 12.8 oz (76.114 kg) 167 lb 8 oz (75.978 kg)    PHYSICAL EXAM  General: Pleasant, NAD. Neuro: Alert and oriented X 3. Moves all extremities spontaneously. Psych: Normal affect. HEENT:  Normal  Neck: Supple without bruits or JVD. Lungs:  Resp regular and unlabored, CTA. Heart: RRR no s3, s4, or murmurs. Abdomen: Soft, non-tender, non-distended, BS + x 4.  Extremities: No clubbing, cyanosis or edema. DP/PT/Radials 2+ and equal bilaterally. R groin cath  site without hematoma.   Accessory Clinical Findings  CBC  Recent Labs  06/25/15 0725 06/26/15 0532  WBC 8.0 6.1  HGB 13.7 13.4  HCT 42.1 41.1  MCV 88.3 89.0  PLT 161 902   Basic Metabolic Panel  Recent Labs  06/25/15 1045 06/26/15 0532  NA 139 136  K 3.7 3.8  CL 106 103  CO2 26 26  GLUCOSE 120* 171*  BUN 13 12  CREATININE 1.07 1.18  CALCIUM 9.0 8.7*  Cardiac Enzymes  Recent Labs  06/25/15 0725 06/25/15 1540  TROPONINI <0.03 <0.03    TELE  NSR  Radiology/Studies  Dg Chest 2 View  06/24/2015   CLINICAL DATA:  Left chest pain today.  EXAM: CHEST  2 VIEW  COMPARISON:  October 13, 2014  FINDINGS: The heart size and mediastinal contours are within normal limits. There is no focal infiltrate, pulmonary edema, or pleural effusion. The visualized skeletal structures are stable.  IMPRESSION: No active cardiopulmonary disease.   Electronically Signed   By: Abelardo Diesel M.D.   On: 06/24/2015 21:11   Echo 02/2014 LV EF: 50% -  55%  -------------------------------------------------------------------  Indications:   Chest pain 786.51. TIA 435.9.  ------------------------------------------------------------------- History:  Risk factors: Hypertension. Diabetes mellitus.  ------------------------------------------------------------------- Study Conclusions  - Left ventricle: The cavity size was normal. There was moderate focal basal and mild concentric hypertrophy of the left ventricle. Systolic function was normal. The estimated ejection fraction was in the range of 50% to 55%. Wall motion was normal; there were no regional wall motion abnormalities. Doppler parameters are consistent with abnormal left ventricular relaxation (grade 1 diastolic dysfunction). - Aortic valve: Trileaflet; mildly thickened, mildly calcified leaflets. There was trivial regurgitation. - Aortic root: The aortic root was normal in size. - Left atrium: The atrium was  normal in size. - Right ventricle: Systolic function was normal. - Right atrium: The atrium was normal in size. - Tricuspid valve: There was mild regurgitation. - Pulmonary arteries: Systolic pressure was within the normal range.  Impressions:  - There was moderate focal basal and mild concentric hypertrophy of the left ventricle. Normal left ventricular size and function. No significant valvular abnormality. Normal RV function and RVSP.  Cath 06/25/15 Conclusion     Ost 2nd Mrg to 2nd Mrg lesion with previously placed drug-eluting stent with minimal restenosis.  Lat 2nd Mrg lesion, 80% stenosed. Jailed by old stent. Unchanged from last cath.  Prox Cx lesion, 20% stenosed.  Ost LAD lesion, 30% stenosed.  Prox LAD to Mid LAD lesion, 30% stenosed.  Dist LAD lesion, 30% stenosed.  1st Diag lesion, 30% stenosed.  Ost RCA to Prox RCA lesion, 15% stenosed. The lesion was previously treated with a stent (unknown type) .  Mid RCA to Dist RCA lesion, 20% stenosed.  Dist RCA lesion, 20% stenosed.  Ost RPDA lesion, 60% stenosed. Small to moderate caliber vessel. Unchanged from last cath.  The left ventricular systolic function is normal.  1. Double vessel CAD with patent stent mid Circumflex/OM2 and patent stent RCA 2. Moderate stenosis in the small caliber sub-branch of OM2 where it is jailed by the old stent. This is a small caliber branch vessel. Unchanged in appearance from last cath in 2015.  3. Moderate stenosis in the small to moderate caliber ostial PDA, unchanged from last cath in 2015.  4. Normal LV function  Recommendations: Continue medical therapy for CAD.       ASSESSMENT AND PLAN  1. Unstable angina - History of CAD s/p LCX, RCA PCI in 2015. His pain is nitro responsive.  Trop x 3 negative.  -Echo 02/2014 showed LV EF of 50-55%, grade 1 DD, mild LVH.  - Cath showed double vessel CAD with patent stent mid Circumflex/OM2 and patent stent RCA;  moderate stenosis in the small caliber sub-branch of OM2 where it is jailed by the old stent. This is a small caliber branch vessel. Unchanged in appearance from last cath in 2015. Moderate stenosis in the small to moderate caliber ostial PDA, unchanged from last cath in 2015. Normal LV function.  - Continue ASA, plavix, coreg, statin, ACE,  imdur.  - Creatinine stable. Ambulate and discharge later today.   2. HTN - stable and well controlled now.   3. HL - Continue statin - 04/20/2015 LDL 101  4. Hypokalemia - Resolved  5. DM - HgbA1C 6.9 04/03/15 -Will place on SSI - F/u with PCP  Signed, Bhagat,Bhavinkumar PA-C Pager [phone]   Agree with note by Robbie Lis PA-C  Results of cath noted. Patent stents with non critical CAD. Enz neg. OK for DC home this AM. F/O with Dr. Aundra Dubin as  OP.    Lorretta Harp, M.D., [place], [facility] [place], [place] [address] [place], [place]  [address]  [phone] 06/26/2015 9:55 AM

## 2015-07-01 ENCOUNTER — Ambulatory Visit (INDEPENDENT_AMBULATORY_CARE_PROVIDER_SITE_OTHER): Payer: Medicare Other | Admitting: Family Medicine

## 2015-07-01 VITALS — BP 138/88 | HR 78 | Temp 98.2°F | Resp 17 | Ht 66.5 in | Wt 168.0 lb

## 2015-07-01 DIAGNOSIS — I739 Peripheral vascular disease, unspecified: Secondary | ICD-10-CM | POA: Diagnosis not present

## 2015-07-01 DIAGNOSIS — Z79899 Other long term (current) drug therapy: Secondary | ICD-10-CM

## 2015-07-01 DIAGNOSIS — Z794 Long term (current) use of insulin: Secondary | ICD-10-CM

## 2015-07-01 DIAGNOSIS — Z9119 Patient's noncompliance with other medical treatment and regimen: Secondary | ICD-10-CM

## 2015-07-01 DIAGNOSIS — Z91199 Patient's noncompliance with other medical treatment and regimen due to unspecified reason: Secondary | ICD-10-CM

## 2015-07-01 DIAGNOSIS — E118 Type 2 diabetes mellitus with unspecified complications: Secondary | ICD-10-CM | POA: Diagnosis not present

## 2015-07-01 DIAGNOSIS — Z789 Other specified health status: Secondary | ICD-10-CM | POA: Diagnosis not present

## 2015-07-01 DIAGNOSIS — E785 Hyperlipidemia, unspecified: Secondary | ICD-10-CM

## 2015-07-01 DIAGNOSIS — L98499 Non-pressure chronic ulcer of skin of other sites with unspecified severity: Secondary | ICD-10-CM

## 2015-07-01 LAB — COMPREHENSIVE METABOLIC PANEL
ALT: 19 U/L (ref 9–46)
AST: 19 U/L (ref 10–35)
Albumin: 4.3 g/dL (ref 3.6–5.1)
Alkaline Phosphatase: 77 U/L (ref 40–115)
BUN: 14 mg/dL (ref 7–25)
CO2: 29 mmol/L (ref 20–31)
Calcium: 9 mg/dL (ref 8.6–10.3)
Chloride: 103 mmol/L (ref 98–110)
Creat: 1.13 mg/dL (ref 0.70–1.25)
Glucose, Bld: 120 mg/dL — ABNORMAL HIGH (ref 65–99)
Potassium: 4.2 mmol/L (ref 3.5–5.3)
Sodium: 139 mmol/L (ref 135–146)
Total Bilirubin: 0.7 mg/dL (ref 0.2–1.2)
Total Protein: 7 g/dL (ref 6.1–8.1)

## 2015-07-01 LAB — GLUCOSE, POCT (MANUAL RESULT ENTRY): POC Glucose: 112 mg/dl — AB (ref 70–99)

## 2015-07-01 LAB — HEMOGLOBIN A1C: Hgb A1c MFr Bld: 6.9 % — AB (ref 4.0–6.0)

## 2015-07-01 LAB — POCT GLYCOSYLATED HEMOGLOBIN (HGB A1C): Hemoglobin A1C: 6.9

## 2015-07-01 MED ORDER — GLUCOSE BLOOD VI STRP: ORAL_STRIP | Status: DC

## 2015-07-01 MED ORDER — INSULIN PEN NEEDLE 32G X 5 MM MISC: Status: DC

## 2015-07-01 MED ORDER — INSULIN DETEMIR 100 UNIT/ML FLEXPEN: 10.0000 [IU] | PEN_INJECTOR | Freq: Every day | SUBCUTANEOUS | Status: DC

## 2015-07-01 MED ORDER — ONETOUCH ULTRASOFT LANCETS MISC: Status: DC

## 2015-07-01 MED ORDER — LIRAGLUTIDE 18 MG/3ML ~~LOC~~ SOPN: 1.2000 mg | PEN_INJECTOR | Freq: Every day | SUBCUTANEOUS | Status: DC

## 2015-07-01 NOTE — Patient Instructions (Addendum)
Your insurance wants you to use Levemir (normally a black and blue green insulin pen) rather than the Lantus (the prior gray insulin pen). Still take 10 units every night.  Still take 1.2 mg of the Victoza (blue pen) every morning.  Diabetes and Standards of Medical Care Diabetes is complicated. You may find that your diabetes team includes a dietitian, nurse, diabetes educator, eye doctor, and more. To help everyone know what is going on and to help you get the care you deserve, the following schedule of care was developed to help keep you on track. Below are the tests, exams, vaccines, medicines, education, and plans you will need. HbA1c test This test shows how well you have controlled your glucose over the past 2-3 months. It is used to see if your diabetes management plan needs to be adjusted.   It is performed at least 2 times a year if you are meeting treatment goals.  It is performed 4 times a year if therapy has changed or if you are not meeting treatment goals. Blood pressure test  This test is performed at every routine medical visit. The goal is less than 140/90 mm Hg for most people, but 130/80 mm Hg in some cases. Ask your health care provider about your goal. Dental exam  Follow up with the dentist regularly. Eye exam  If you are diagnosed with type 1 diabetes as a child, get an exam upon reaching the age of 66 years or older and having had diabetes for 3-5 years. Yearly eye exams are recommended after that initial eye exam.  If you are diagnosed with type 1 diabetes as an adult, get an exam within 5 years of diagnosis and then yearly.  If you are diagnosed with type 2 diabetes, get an exam as soon as possible after the diagnosis and then yearly. Foot care exam  Visual foot exams are performed at every routine medical visit. The exams check for cuts, injuries, or other problems with the feet.  You should have a complete foot exam performed every year. This exam includes an  inspection of the structure and skin of your feet, a check of the pulses in your feet, and a check of the sensation in your feet.  Type 1 diabetes: The first exam is performed 5 years after diagnosis.  Type 2 diabetes: The first exam is performed at the time of diagnosis.  Check your feet nightly for cuts, injuries, or other problems with your feet. Tell your health care provider if anything is not healing. Kidney function test (urine microalbumin)  This test is performed once a year.  Type 1 diabetes: The first test is performed 5 years after diagnosis.  Type 2 diabetes: The first test is performed at the time of diagnosis.  A serum creatinine and estimated glomerular filtration rate (eGFR) test is done once a year to assess the level of chronic kidney disease (CKD), if present. Lipid profile (cholesterol, HDL, LDL, triglycerides)  Performed every 5 years for most people.  The goal for LDL is less than 100 mg/dL. If you are at high risk, the goal is less than 70 mg/dL.  The goal for HDL is 40 mg/dL-50 mg/dL for men and 50 mg/dL-60 mg/dL for women. An HDL cholesterol of 60 mg/dL or higher gives some protection against heart disease.  The goal for triglycerides is less than 150 mg/dL. Immunizations  The flu (influenza) vaccine is recommended yearly for every person 83 months of age or older who has  diabetes.  The pneumonia (pneumococcal) vaccine is recommended for every person 45 years of age or older who has diabetes. Adults 70 years of age or older may receive the pneumonia vaccine as a series of two separate shots.  The hepatitis B vaccine is recommended for adults shortly after they have been diagnosed with diabetes.  The Tdap (tetanus, diphtheria, and pertussis) vaccine should be given:  According to normal childhood vaccination schedules, for children.  Every 10 years, for adults who have diabetes. Diabetes self-management education  Education is recommended at diagnosis  and ongoing as needed. Treatment plan  Your treatment plan is reviewed at every medical visit.   This information is not intended to replace advice given to you by your health care provider. Make sure you discuss any questions you have with your health care provider.   Document Released: 07/10/2009 Document Revised: 10/03/2014 Document Reviewed: 02/12/2013 Elsevier Interactive Patient Education 2016 [place].   Insulin Treatment for Diabetes Diabetes is a disease that does not go away (chronic). It occurs when the body does not properly use the sugar (glucose) that is released from food after it is digested. Glucose levels are controlled by a hormone called insulin, which is made by your pancreas. Depending on the type of diabetes you have, either of the following will apply:   The pancreas does not make any insulin (type 1 diabetes).  The pancreas makes too little insulin, and the body cannot respond normally to the insulin that is made (type 2 diabetes). Without insulin, death can occur. However, with the addition of insulin, blood sugar monitoring, and treatment, someone with diabetes can live a full and productive life. This document will discuss the role of insulin in your treatment and provide information about its use.  HOW IS INSULIN GIVEN? Insulin is a medicine that can only be given by injection. Taking it by mouth makes it inactive because of the acid in your stomach. Insulin is injected under the skin by a syringe and needle, an insulin pen, a pump, or a jet injector. Your dose will be determined by your health care provider based on your individual needs. You will also be given guidance on which method of giving insulin is right for you. Remember that if you give insulin with a needle and syringe, you must do [place] using only a special insulin syringe made for this purpose. WHERE ON THE BODY SHOULD INSULIN BE INJECTED? Insulin is injected into the fatty layer of tissue just under  your skin. Good places to inject insulin include the upper arm, the front and outer area of the thigh, the hips, and the abdomen. Giving your insulin in the abdomen is preferred because this provides the most rapid and consistent absorption. Avoid the area 2 inches (5 cm) around the navel and avoid injecting into areas on your body with scar tissue. In addition, it is important to rotate your injection sites with every shot to prevent irritation and improve absorption. WHAT ARE THE DIFFERENT TYPES OF INSULIN?  If you have type 1 diabetes, you must take insulin to stay alive. Your body does not produce it. If you have type 2 diabetes, you might require insulin in addition to, or instead of, other medicines. In either case, proper use of insulin is critical to control your diabetes.  There are a number of different types of insulin. Usually, you will give yourself injections, though others can be trained to give them to you. Some people have an insulin pump  that delivers insulin continuously through a tube (cannula) that is placed under the skin. Using insulin requires that you check your blood sugar several times a day. The exact number of times and time of day to check will vary depending on your type of diabetes, your type of insulin, and treatment goals. Your health care provider will direct you.  Generally, different insulins have different properties. The following is a general guide. Specifics will vary by product, and new products are introduced periodically.   Rapid-acting insulin starts working quickly (in as little as 5 minutes) and wears off in 4 to 6 hours (sometimes longer). This type of insulin works well when taken just before a meal to bring your blood sugar quickly back to normal.   Short-acting insulin starts working in about 30 minutes and can last 6 to 10 hours. This type of insulin should be taken about 30 minutes before you start eating a meal.  Intermediate-acting insulin starts  working in 1-2 hours and wears off after about 10 to 18 hours. This insulin will lower your blood sugar for a longer period of time, but it will not be as effective in lowering your blood sugar right after a meal.   Long-acting insulin mimics the small amount of insulin that your pancreas usually produces throughout the day. You need to have some insulin present at all times. It is crucial to the metabolism of brain cells and other cells. Long-acting insulin is meant to be used either once or twice a day. It is usually used in combination with other types of insulin, or in combination with other diabetes medicines.  Discuss the type of insulin you are taking with your health care provider or pharmacist. You will then be aware of when the insulin can be expected to peak and when it will wear off. This is important to know [place] you can plan for meal times and periods of exercise.  Your health care provider will usually have a strategy in mind when treating you with insulin. This will vary with your type of diabetes, your diabetes treatment goals, and your health history. It is important that you understand this strategy [place] you can be an active partner in treating your diabetes. Here are some terms you might hear:   Basal insulin. This refers to the small amount of insulin that needs to be present in your blood at all times. Sometimes oral medicines will be enough. For other people, and especially for people with type 1 diabetes, insulin is needed. Usually, intermediate-acting or long-acting insulin is used once or twice a day to accomplish this.   Prandial (meal-related) insulin. Your blood sugar will rise rapidly after a meal. Rapid-acting or short-acting insulin can be used right before the meal to bring your blood sugar back to normal quickly. You might be instructed to adjust the amount of insulin depending on how much carbohydrate (starch) is in your meal.   Corrective insulin. You might be instructed  to check your blood sugar at certain times of the day. You then might use a small amount of rapid-acting or short-acting insulin to bring the blood sugar down to normal if it is elevated.   Tight control (also called intensive therapy). Tight control means keeping your blood sugar as close to your target as possible and keeping it from going too high after meals. People with tight control of their diabetes are shown to have fewer long-term problems from their diabetes.   Glycohemoglobin (also called glyco,  glycosylated hemoglobin, hemoglobin A1c, or A1c) level. This measures how well your blood sugar has been controlled during the past 1 to 3 months. It helps your health care provider see how effective your treatment is and decide if any changes are needed. Your health care provider will discuss your target glycohemoglobin level with you.  Insulin treatment requires your careful attention. While you are being treated with insulin, you should check your blood glucose at least two times each day. Treatment plans will be different for different people. Some people do well with a simple program. Others require more complicated programs, with multiple insulin injections daily. You will work with your health care provider to develop the best program for you. Regardless of your insulin treatment plan, you must also do your best on weight control, diet and food choices, exercise, blood pressure control, cholesterol control, and stress levels.  WHAT ARE THE SIDE EFFECTS OF INSULIN? Although insulin treatment is important, it does have some side effects, such as:   Insulin can cause your blood sugar to go too low (hypoglycemia).   Weight gain can occur.   Improper injection technique can cause hypoglycemia, blood sugar to go too high (hyperglycemia), skin injury or irritation, or other problems. You must learn to inject insulin properly.   This information is not intended to replace advice given to you by  your health care provider. Make sure you discuss any questions you have with your health care provider.   Document Released: 12/09/2008 Document Revised: 10/03/2014 Document Reviewed: 02/24/2013 Elsevier Interactive Patient Education [employer].

## 2015-07-01 NOTE — Progress Notes (Addendum)
Subjective:    Patient ID: Barry [name], male    DOB: 1950/10/06, 64 y.o.   MRN: [id number] This chart was scribed for Delman Cheadle, MD by Zola Button, Medical Scribe. This patient was seen in Room 9 and the patient's care was started at 9:23 AM.   Chief Complaint  Patient presents with  . Hypertension  . Medication Refill    novotwist,lancets,victoza,lisinopril    HPI HPI Comments: Child Barry [name] is a 64 y.o. male with a history of hypertension, NSTEMI, diastolic CHF, CAD, prior CVA, chronic superior mesenteric artery dissection, and type II DM who presents to the [facility] for a hospital follow-up. He speaks [place] Barry [name] has had poor compliance with medical care due to misunderstanding and poor health literacy. Hospitalized from 9/28 - 9/30 when he began having substernal chest pain with minimal exertion. When he came to the ER, his symptoms resolved with NTG paste. Troponin was negative, no CXR changes. Catheterization showed patent stents with non critical CAD, and medication management was recommended. He is being followed at [facility]; however, his blood sugar was noted to be high with HbA1c of 6.9 in July, Barry [name] he was recommended to return here for management of his diabetes. He is on Carvedilol, lisinopril, Crestor, Imdur, and prn NTG. He also takes amlodipine for his blood pressure and Plavix for his stents. He is on nortriptyline and gabapentin 300 3 times a day with 2 times at night for diabetic neuropathy. He is on Victoza for diabetic control. Last office visit here was with me 3 months prior. Patient was previously followed by Dr. Dwyane Dee for his diabetes. He last saw him in March of this year. HbA1c had been up at 9, [place] had improved significantly. In addition to Victoza, he is taking Lantus 10 units qhs and is seeing nutrition. At that time, patient was instructed to follow-up with Dr. Dwyane Dee for further diabetic management. He does not have any follow-up with endocrine  scheduled, though he does have a scheduled appointment with me next week. Patient was requested to schedule a Welcome to Medicare physical, which he has not done. He had urine micro albumin in July that was elevated. He is on lisinopril. Blood counts in the hospital were normal. Last basic metabolic panel did show a slightly low calcium, but prior calcium levels were normal. LFTs not been checked. Last lipid panel was 2.5 months ago and not at goal. Unfortunately, he is on Crestor 40 mg. Prior blood pressures 130s-140s/80s-90s.  Patient is also here for medication refills. He has improved overall since he was discharged from the hospital. He has been checking his blood sugar once a day in the mornings. Past few readings in the morning: 155, 178, 135. He has been giving himself 12 units Lantus and 6 Victoza units in the mornings. Patient has been having hypoglycemic episodes of dizziness, shakiness, and diaphoresis every few days, sometimes at night. When he has these episodes, he eats something sweet to resolve the symptoms. He does have indigestion and mouth dryness. Patient denies abdominal pain. He has been using NovoTwist 32G pen needles, OneTouch Delica Lancets, and OneTouch test strips. He also takes Fluoromethol 0.1% and Pazeo drops. His optometrist is Dr. Maryjane Hurter, [place] [facility] in [place]. He is reliant on family members to take him to appointments. He is available next Monday and Tuesday to see optometry. Patient has noticed some intermittent dizziness which he believes is due to his  medications. He has been taking all of his morning medications at the same time.  Depression screen PHQ 2/9 04/03/2015  Decreased Interest 0  Down, Depressed, Hopeless 0  PHQ - 2 Score 0   Past Medical History  Diagnosis Date  . Hypertension   . Diabetes mellitus   . Stroke ([place])   . Iliac artery aneurysm, left ([place])   . 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[place])     a. Mesenteric Artery Duplex (7/15):   pSMA chronic dissection with aneurysmal dilation of 1.29 cm ([facility]);  b.  Chest CTA (02/26/14):  IMPRESSION:  1. No aortic dissection or other acute abnormality.  2. 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  3. Stable 17 mm ectasia of the left common iliac artery.  4. Atherosclerosis, including aortoiliac and coronary artery disease.   Barry [name] CAD (coronary artery disease)     a. LHC (02/26/14):  mLAD 20 (faint L>R collats), mCFX 50, pRCA 95 (plaque rupture) >> PCI:  Xience DES to pRCA (severe tortuosity of R innominate artery >> needs L radial or FA in future);  b. LHC (03/10/14):  CFX 90, oOM 70, pRCA stent patent >> PCI:  Xience DES to mCFX   . Myocardial infarction ([place])   . Hx of cardiovascular stress test     Lexiscan Myoview (2/16):  Inferior lateral defect c/w thinning vs small prior infarct, no ischemia, EF 49%, Low Risk  . Hx of echocardiogram     a.  Echocardiogram (02/27/14):  Mod focal basal and mild concentric LVH, EF 50-55%, no RWMA, Gr 1 DD, mild TR, normal RVF  . Carotid stenosis     a. Carotid US (02/27/14):  Bilateral ICA 1-39%  . Hyperlipidemia    Past Surgical History  Procedure Laterality Date  . Admission  09/26/2009    CVA.  Barry [name].  . Cardiac catheterization    . Coronary angioplasty    . Left heart catheterization with coronary angiogram N/A 02/26/2014    Procedure: LEFT HEART CATHETERIZATION WITH CORONARY ANGIOGRAM;  Surgeon: Wellington Hampshire, MD;  Location: [facility] CATH LAB;  Service: Cardiovascular;  Laterality: N/A;  . Left heart catheterization with coronary angiogram N/A 03/10/2014    Procedure: LEFT HEART CATHETERIZATION WITH CORONARY ANGIOGRAM;  Surgeon: Jettie Booze, MD;  Location: [facility] CATH LAB;  Service: Cardiovascular;  Laterality: N/A;  . Cardiac catheterization N/A 06/25/2015    Procedure: Left Heart Cath and Coronary Angiography;  Surgeon: Burnell Blanks, MD;  Location: [facility] CV LAB;  Service: Cardiovascular;  Laterality: N/A;   Current  Outpatient Prescriptions on File Prior to Visit  Medication Sig Dispense Refill  . aspirin 81 MG EC tablet Take 1 tablet (81 mg total) by mouth daily.    . carvedilol (COREG) 6.25 MG tablet Take 1 tablet (6.25 mg total) by mouth 2 (two) times daily with a meal. 60 tablet 3  . gabapentin (NEURONTIN) 300 MG capsule Take 1 capsule (300 mg total) by mouth 3 (three) times daily. And 2 tabs as needed at night for leg pain 90 capsule 4  . isosorbide mononitrate (IMDUR) 30 MG 24 hr tablet Take 1 tablet (30 mg total) by mouth daily. 30 tablet 11  . lisinopril (PRINIVIL,ZESTRIL) 20 MG tablet Take 1 tablet (20 mg total) by mouth daily. 30 tablet 11  . nitroGLYCERIN (NITROSTAT) 0.4 MG SL tablet Place 1 tablet (0.4 mg total) under the tongue every 5 (five) minutes as needed for chest pain. 25 tablet 1  . nortriptyline (  PAMELOR) 25 MG capsule Take 1 capsule (25 mg total) by mouth at bedtime. 30 capsule 3  . pantoprazole (PROTONIX) 40 MG tablet Take 1 tablet (40 mg total) by mouth daily. 30 tablet 3  . polyethylene glycol powder (GLYCOLAX/MIRALAX) powder Take 17 g by mouth 2 (two) times daily as needed for mild constipation. 500 g 11  . rosuvastatin (CRESTOR) 40 MG tablet Take 1 tablet (40 mg total) by mouth daily. 90 tablet 0  . amLODipine (NORVASC) 5 MG tablet Take 1 tablet (5 mg total) by mouth daily. (Patient not taking: Reported on 06/24/2015) 30 tablet 11  . Blood Glucose Monitoring Suppl (ONE TOUCH ULTRA SYSTEM KIT) W/DEVICE KIT 1 kit by Does not apply route once. One touch delica lancets, and meter Pt will test once daily. Dx 250.92 1 each 0  . clopidogrel (PLAVIX) 75 MG tablet Take 1 tablet (75 mg total) by mouth daily with breakfast. 90 tablet 1  . docusate sodium (COLACE) 100 MG capsule Take 1 capsule (100 mg total) by mouth daily. For constipation (Patient not taking: Reported on 06/24/2015) 90 capsule 1   No current facility-administered medications on file prior to visit.   No Known Allergies Family  History  Problem Relation Age of Onset  . Diabetes Father   . Hypertension Father   . Heart attack Father   . Stroke Neg Hx    Social History   Social History  . Marital Status: Married    Spouse Name: N/A  . Number of Children: 3  . Years of Education: N/A   Occupational History  . Retired    Social History Main Topics  . Smoking status: Former Smoker    Quit date: 06/09/2009  . Smokeless tobacco: Never Used  . Alcohol Use: No  . Drug Use: No  . Sexual Activity: Not Currently   Other Topics Concern  . None   Social History Narrative   Marital: married.      Lives: with son,wife.       Children: 3 children; 6 grandchildren      Employed: unemployed; disability unknown reason/CVA L sided weakness      Tobacco:  Quit 2012; smoked 40 years.       Alcohol: no drinking now; social in past.       Drugs: none       Exercise: sporadic.       ADLs:  No driving since CVA.     Review of Systems  Constitutional: Positive for diaphoresis.  Eyes: Positive for visual disturbance. Negative for photophobia, discharge and itching.  Respiratory: Negative for chest tightness.   Cardiovascular: Negative for chest pain, palpitations and leg swelling.  Gastrointestinal: Negative for nausea, vomiting and abdominal pain.  Endocrine: Negative for polydipsia, polyphagia and polyuria.  Genitourinary: Negative for frequency and enuresis.  Musculoskeletal: Positive for arthralgias. Negative for myalgias and joint swelling.  Skin: Negative for rash.  Neurological: Positive for dizziness.  Hematological: Negative for adenopathy. Does not bruise/bleed easily.  Psychiatric/Behavioral: Negative for dysphoric mood.       Objective:  BP 138/88 mmHg  Pulse 78  Temp(Src) 98.2 F (36.8 C) (Oral)  Resp 17  Ht 5' 6.5" (1.689 m)  Wt 168 lb (76.204 kg)  BMI 26.71 kg/m2  SpO2 97%  Physical Exam  Constitutional: He is oriented to person, place, and time. He appears well-developed and  well-nourished. No distress.  HENT:  Head: Normocephalic and atraumatic.  Mouth/Throat: Oropharynx is clear and moist. No oropharyngeal exudate.  Eyes: Pupils are equal, round, and reactive to light.  Neck: Neck supple.  Cardiovascular: Normal rate, regular rhythm, S1 normal, S2 normal and normal heart sounds.   Pulmonary/Chest: Effort normal and breath sounds normal. No respiratory distress. He has no wheezes. He has no rales.  Clear to auscultation bilaterally.   Musculoskeletal: He exhibits no edema.  Neurological: He is alert and oriented to person, place, and time. No cranial nerve deficit.  Skin: Skin is warm and dry. No rash noted.  Psychiatric: He has a normal mood and affect. His behavior is normal.  Nursing note and vitals reviewed.         Assessment & Plan:   1. Type 2 diabetes mellitus with complication, with long-term current use of insulin (HCC) - spent extensive time reviewing use of meds to ensure that he understands exactly how and when to use all meds despite language barrier.  Has been taking victoza 0.6 mg qd - increase to 1.2 mg qam, has been taking levemir 12u qad as well - decrease to 10u qhs, cont to monitor with cbgs fasting qam and 2 hours post-prandial.  2. Language barrier   3. Non compliance with medical treatment - pt's wife helps him fill a med box every wk - rec using med boxes for different times of day as well.  4. Polypharmacy   5. Hypocalcemia - recheck, with vit D  6.      HPL - on crestor 40 - suspect non-compliance due to language barrier as chol not sig improved on this. Importance of compliance reinforced. F/u with cardiology for further management.  Pt speaks [place] and has a Theatre manager here with him today.  Orders Placed This Encounter  Procedures  . Comprehensive metabolic panel  . Vit D  25 hydroxy (rtn osteoporosis monitoring)  . POCT glycosylated hemoglobin (Hb A1C)  . POCT glucose (manual entry)    Meds ordered  this encounter  Medications  . Lancets (ONETOUCH ULTRASOFT) lancets    Sig: Test blood sugar 3 times daily. Dx code: E11.8    Dispense:  100 each    Refill:  12  . glucose blood (ONE TOUCH ULTRA TEST) test strip    Sig: Test blood sugar 3 times a day. Dx code: E11.8    Dispense:  100 each    Refill:  10  . Liraglutide 18 MG/3ML SOPN    Sig: Inject 0.2 mLs (1.2 mg total) into the skin daily.    Dispense:  6 mL    Refill:  3  . Insulin Pen Needle (NOVOTWIST) 32G X 5 MM MISC    Sig: Use 2 per day to inject lantus and victoza    Dispense:  90 each    Refill:  3  . Insulin Detemir (LEVEMIR FLEXTOUCH) 100 UNIT/ML Pen    Sig: Inject 10 Units into the skin daily at 10 pm.    Dispense:  9 mL    Refill:  1   Language barrier caveat.  I personally performed the services described in this documentation, which was scribed in my presence. The recorded information has been reviewed and considered, and addended by me as needed.  Delman Cheadle, MD MPH   By signing my name below, I, Zola Button, attest that this documentation has been prepared under the direction and in the presence of Delman Cheadle, MD.  Electronically Signed: Zola Button, Medical Scribe. 07/01/2015. 9:23 AM.  Results for orders placed or performed in visit on 07/01/15  Comprehensive  metabolic panel  Result Value Ref Range   Sodium 139 135 - 146 mmol/L   Potassium 4.2 3.5 - 5.3 mmol/L   Chloride 103 98 - 110 mmol/L   CO2 29 20 - 31 mmol/L   Glucose, Bld 120 (H) 65 - 99 mg/dL   BUN 14 7 - 25 mg/dL   Creat 1.13 0.70 - 1.25 mg/dL   Total Bilirubin 0.7 0.2 - 1.2 mg/dL   Alkaline Phosphatase 77 40 - 115 U/L   AST 19 10 - 35 U/L   ALT 19 9 - 46 U/L   Total Protein 7.0 6.1 - 8.1 g/dL   Albumin 4.3 3.6 - 5.1 g/dL   Calcium 9.0 8.6 - 10.3 mg/dL  Vit D  25 hydroxy (rtn osteoporosis monitoring)  Result Value Ref Range   Vit D, 25-Hydroxy 29 (L) 30 - 100 ng/mL  POCT glycosylated hemoglobin (Hb A1C)  Result Value Ref Range   Hemoglobin  A1C 6.9   POCT glucose (manual entry)  Result Value Ref Range   POC Glucose 112 (A) 70 - 99 mg/dl

## 2015-07-02 DIAGNOSIS — M4806 Spinal stenosis, lumbar region: Secondary | ICD-10-CM | POA: Diagnosis not present

## 2015-07-02 DIAGNOSIS — Z6827 Body mass index (BMI) 27.0-27.9, adult: Secondary | ICD-10-CM | POA: Diagnosis not present

## 2015-07-02 LAB — VITAMIN D 25 HYDROXY (VIT D DEFICIENCY, FRACTURES): Vit D, 25-Hydroxy: 29 ng/mL — ABNORMAL LOW (ref 30–100)

## 2015-07-07 ENCOUNTER — Encounter: Payer: Self-pay | Admitting: Family Medicine

## 2015-07-07 DIAGNOSIS — H43393 Other vitreous opacities, bilateral: Secondary | ICD-10-CM | POA: Diagnosis not present

## 2015-07-08 ENCOUNTER — Telehealth: Payer: Self-pay | Admitting: Family Medicine

## 2015-07-08 NOTE — Telephone Encounter (Signed)
lmom that he doesn't have to come to his appt on the 13th of Oct since he was seen last week

## 2015-07-09 ENCOUNTER — Ambulatory Visit: Payer: Medicare Other | Admitting: Family Medicine

## 2015-07-10 ENCOUNTER — Ambulatory Visit: Payer: Medicare Other | Admitting: Family Medicine

## 2015-07-12 NOTE — Progress Notes (Signed)
Cardiology Office Note   Date:  07/13/2015   ID:  Barry [name], DOB [birth year], MRN [id number]  PCP:  Lamar Blinks, MD  Cardiologist:  Dr. Loralie Champagne     Chief Complaint  Patient presents with  . Hospitalization Follow-up    Admx with chest pain; s/p cardiac cath  . Coronary Artery Disease     History of Present Illness: Barry [name] is a 64 y.o. [place] male with a hx of CAD, chronic superior mesenteric artery dissection, prior stroke, DM2, HTN, CKD, HL. He originally presented in 02/2014 with non-STEMI. LHC demonstrated a ruptured plaque and thrombus in the proximal RCA which was treated with a DES. He was readmitted several weeks later with unstable angina and LHC demonstrated high-grade stenosis in the CFX which was treated with a DES.  He was seen 09/2014 and complained of left-sided chest discomfort and dizziness.  Lexiscan Myoview was low risk with inferior lateral defect consistent with thinning versus small prior infarct. There was no ischemia. EF was 49%.   I saw him 9/26 with 3 days of substernal chest burning that appeared to be exertional. He could also reproduce symptoms by laying flat.  I discussed his case with Dr. Loralie Champagne and we recommended proceeding with cardiac cath.  However, he refused proceeding with cardiac cath. I placed him on Imdur.  He was then admitted 9/28-9/30 with worsening chest pain c/w unstable angina.  CEs remained negative.  LHC was arranged and demonstrated 2v CAD with patent mid LCx/OM2 stent and patent RCA stent, mod stenosis in a small caliber sub-branch of OM2 jailed by the old stent (unchanged from 2015), mod stenosis in a small to mod caliber ostial PDA (unchanged from 2015).  Med Rx was recommended.    Returns for FU.  Here today with an interpreter.  He denies any further chest pain.  He denies significant dyspnea.  He denies syncope, orthopnea, PND, edema.  He has a lot of back pain and leg pain.  He brings in an MRI report. He has DDD  and spinal stenosis. He is followed by neurosurgery. He also notes rectal bleeding yesterday.  He notices this a couple times a month.  He notes pain associated with it and it generally occurs with straining.  He was seen by GI last year. It looks like a colonoscopy was to be scheduled. I cannot find that he had this done. He also c/o rectal bleeding last year to Dr. Trula Slade.  It looks like he was sent to the ED for this.      Studies/Reports Reviewed Today:  LHC 06/25/15 LAD:  Ostial 30%, prox 30%, dist 30%, D1 30% LCx: prox 20%, OM2 stent ok with 10% ISR, small lateral OM2 80% RCA:  Ostial stent ok with 15%, mid 20%, dist 20%, ostial RPDA 60% EF 55-65% 1. Double vessel CAD with patent stent mid Circumflex/OM2 and patent stent RCA 2. Moderate stenosis in the small caliber sub-branch of OM2 where it is jailed by the old stent. This is a small caliber branch vessel. Unchanged in appearance from last cath in 2015.  3. Moderate stenosis in the small to moderate caliber ostial PDA, unchanged from last cath in 2015.  4. Normal LV function Recommendations: Continue medical therapy for CAD.     Lexiscan Myoview 11/18/14 Inferior lateral defect consistent with thinning vs small prior infarct; no ischemia.  EF 49%    Past Medical History  Diagnosis Date  . Hypertension   . Diabetes mellitus   .  Stroke ([place])   . Iliac artery aneurysm, left ([place])   . 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[place])     a. Mesenteric Artery Duplex (7/15):  pSMA chronic dissection with aneurysmal dilation of 1.29 cm ([facility]);  b.  Chest CTA (02/26/14):  IMPRESSION:  1. No aortic dissection or other acute abnormality.  2. 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  3. Stable 17 mm ectasia of the left common iliac artery.  4. Atherosclerosis, including aortoiliac and coronary artery disease.   Marland Kitchen CAD (coronary artery disease)     a. LHC (02/26/14):  mLAD 20 (faint L>R collats), mCFX 50, pRCA 95 (plaque rupture) >> PCI:   Xience DES to pRCA (severe tortuosity of R innominate artery >> needs L radial or FA in future);  b. LHC (03/10/14):  CFX 90, oOM 70, pRCA stent patent >> PCI:  Xience DES to mCFX   . Myocardial infarction ([place])   . Hx of cardiovascular stress test     Lexiscan Myoview (2/16):  Inferior lateral defect c/w thinning vs small prior infarct, no ischemia, EF 49%, Low Risk  . Hx of echocardiogram     a.  Echocardiogram (02/27/14):  Mod focal basal and mild concentric LVH, EF 50-55%, no RWMA, Gr 1 DD, mild TR, normal RVF  . Carotid stenosis     a. Carotid US (02/27/14):  Bilateral ICA 1-39%  . Hyperlipidemia   1. HTN 2. Type II diabetes 3. CVA in 2011 4. AAA: 2.2 cm in 3/15.  5. PAD: Left CIA with penetrating ulcer. 6. Chronic superior mesenteric artery dissection, followed by Dr Trula Slade. 7. CKD 8. CAD: NSTEMI 6/15 with 95% proximal RCA stenosis treated with DES. Unstable angina later in 6/15, this time had DES to 90% mLCx. Echo (6/15) with EF 50-55%, moderate focal basal septal hypertrophy, normal RV size and systolic function. Lexiscan Cardiolite (2/16) with EF 49%, small fixed inferolateral defect with no ischemia.  9. Carotid dopplers (6/15) with mild stenosis.  10. Hyperlipidemia: Did not tolerate atorvastatin.   Past Surgical History  Procedure Laterality Date  . Admission  09/26/2009    CVA.  [name].  . Cardiac catheterization    . Coronary angioplasty    . Left heart catheterization with coronary angiogram N/A 02/26/2014    Procedure: LEFT HEART CATHETERIZATION WITH CORONARY ANGIOGRAM;  Surgeon: Wellington Hampshire, MD;  Location: [facility] CATH LAB;  Service: Cardiovascular;  Laterality: N/A;  . Left heart catheterization with coronary angiogram N/A 03/10/2014    Procedure: LEFT HEART CATHETERIZATION WITH CORONARY ANGIOGRAM;  Surgeon: Jettie Booze, MD;  Location: [facility] CATH LAB;  Service: Cardiovascular;  Laterality: N/A;  . Cardiac catheterization N/A 06/25/2015    Procedure: Left Heart  Cath and Coronary Angiography;  Surgeon: Burnell Blanks, MD;  Location: [place] [facility] CV LAB;  Service: Cardiovascular;  Laterality: N/A;     Current Outpatient Prescriptions  Medication Sig Dispense Refill  . amLODipine (NORVASC) 5 MG tablet Take 1 tablet (5 mg total) by mouth daily. 30 tablet 11  . aspirin 81 MG EC tablet Take 1 tablet (81 mg total) by mouth daily.    . Blood Glucose Monitoring Suppl (ONE TOUCH ULTRA SYSTEM KIT) W/DEVICE KIT 1 kit by Does not apply route once. One touch delica lancets, and meter Pt will test once daily. Dx 250.92 1 each 0  . carvedilol (COREG) 6.25 MG tablet Take 1 tablet (6.25 mg total) by mouth 2 (two) times daily with a meal. 60 tablet 3  .  clopidogrel (PLAVIX) 75 MG tablet Take 1 tablet (75 mg total) by mouth daily with breakfast. 90 tablet 1  . docusate sodium (COLACE) 100 MG capsule Take 1 capsule (100 mg total) by mouth daily. For constipation 90 capsule 1  . gabapentin (NEURONTIN) 300 MG capsule Take 1 capsule (300 mg total) by mouth 3 (three) times daily. And 2 tabs as needed at night for leg pain 90 capsule 4  . glucose blood (ONE TOUCH ULTRA TEST) test strip Test blood sugar 3 times a day. Dx code: E11.8 100 each 10  . Insulin Detemir (LEVEMIR FLEXTOUCH) 100 UNIT/ML Pen Inject 10 Units into the skin daily at 10 pm. 9 mL 1  . Insulin Pen Needle (NOVOTWIST) 32G X 5 MM MISC Use 2 per day to inject lantus and victoza 90 each 3  . isosorbide mononitrate (IMDUR) 30 MG 24 hr tablet Take 1 tablet (30 mg total) by mouth daily. 30 tablet 11  . Lancets (ONETOUCH ULTRASOFT) lancets Test blood sugar 3 times daily. Dx code: E11.8 100 each 12  . Liraglutide 18 MG/3ML SOPN Inject 0.2 mLs (1.2 mg total) into the skin daily. 6 mL 3  . lisinopril (PRINIVIL,ZESTRIL) 20 MG tablet Take 1 tablet (20 mg total) by mouth daily. 30 tablet 11  . nitroGLYCERIN (NITROSTAT) 0.4 MG SL tablet Place 1 tablet (0.4 mg total) under the tongue every 5 (five) minutes as needed for  chest pain. 25 tablet 1  . nortriptyline (PAMELOR) 25 MG capsule Take 1 capsule (25 mg total) by mouth at bedtime. 30 capsule 3  . pantoprazole (PROTONIX) 40 MG tablet Take 1 tablet (40 mg total) by mouth daily. 30 tablet 3  . polyethylene glycol powder (GLYCOLAX/MIRALAX) powder Take 17 g by mouth 2 (two) times daily as needed for mild constipation. 500 g 11  . rosuvastatin (CRESTOR) 40 MG tablet Take 1 tablet (40 mg total) by mouth daily. 90 tablet 0   No current facility-administered medications for this visit.    Allergies:   Review of patient's allergies indicates no known allergies.    Social History:  The patient  reports that he quit smoking about 6 years ago. He has never used smokeless tobacco. He reports that he does not drink alcohol or use illicit drugs.   Family History:  The patient's family history includes Diabetes in his father; Heart attack in his father; Hypertension in his father. There is no history of Stroke.    ROS:   Please see the history of present illness.   Review of Systems  Hematologic/Lymphatic: Positive for bleeding problem.  Musculoskeletal: Positive for back pain, joint pain and myalgias.  All other systems reviewed and are negative.     PHYSICAL EXAM: VS:  BP 102/70 mmHg  Pulse 70  Ht 5' 6.5" (1.689 m)  Wt 167 lb 1.9 oz (75.805 kg)  BMI 26.57 kg/m2    Wt Readings from Last 3 Encounters:  07/13/15 167 lb 1.9 oz (75.805 kg)  07/01/15 168 lb (76.204 kg)  06/26/15 167 lb 8 oz (75.978 kg)     GEN: Well nourished, well developed, in no acute distress HEENT: normal Neck: no JVD,   no masses Cardiac:  Normal S1/S2, RRR; no murmur ,  no rubs or gallops, no edema; R groin without hematoma or bruit    Respiratory:  clear to auscultation bilaterally, no wheezing, rhonchi or rales. GI: soft, nontender, nondistended, + BS MS: no deformity or atrophy Skin: warm and dry  Neuro:  CNs  II-XII intact, Strength and sensation are intact Psych: Normal  affect   EKG:  EKG is not ordered today.  It demonstrates:   NSR, HR 70, inf Q waves, PRWP, IVCD, NSSTTW changes, QTc 432 ms, no significant change from prior tracing.    Recent Labs: 06/26/2015: Hemoglobin 13.4; Platelets 163 07/01/2015: ALT 19; BUN 14; Creat 1.13; Potassium 4.2; Sodium 139    Lipid Panel    Component Value Date/Time   CHOL 157 04/20/2015 0951   TRIG 207.0* 04/20/2015 0951   HDL 30.30* 04/20/2015 0951   CHOLHDL 5 04/20/2015 0951   VLDL 41.4* 04/20/2015 0951   LDLCALC 143* 12/18/2014 0834   LDLDIRECT 101.0 04/20/2015 0951      ASSESSMENT AND PLAN:  1.CAD:  S/p NSTEMI in 2015 treated with DES to the RCA and subsequent PCI to the LCx 2/2 [place]  Myoview in 2/16 was neg for ischemia. EF low normal on echo and mildly decreased on recent Myoview. He was recently hospitalized with worsening chest pain.  LHC demonstrated patent stents to the RCA and LCx and otherwise stable anatomy compared to the films in 2015.  Medical Rx is recommended.  He denies further chest pain.  Continue ASA 81, Plavix 75, carvedilol, ACEI, statin, nitrates.   2. PAD: Penetrating ulcer left CIA and chronic SMA dissection. Last CTA abdomen did not show any changes in the appearance of the SMA. He is followed by [facility].  3. DM2:  A1c was 6.9 in 7/16.  FU with PCP as planned.   4. Smoking:  He has quit smoking.     5. Hyperlipidemia:  Continue high intensity statin therapy with Crestor 40 mg QD.   6. HTN: controlled.   7. AAA: Will be followed by [facility].   8. Rectal Bleeding:  Refer to GI. Suspect hemorrhoidal bleeding.  However, with his hx of chronic SMA dissection, will need to have him followed by GI.  Check BMET, CBC today.  It has been > 12 mos since his PCI in the setting of ACS.  He can hold Plavix for 5 days if he sees recurrent bleeding. Once bleeding has stopped, he can resume the Plavix.   9. Lumbar Stenosis:  FU with neurosurgery as planned.     Medication  Changes: Current medicines are reviewed at length with the patient today.  Concerns regarding medicines are as outlined above.  The following changes have been made:   Discontinued Medications   No medications on file   Modified Medications   No medications on file   New Prescriptions   No medications on file   Labs/ tests ordered today include:   Orders Placed This Encounter  Procedures  . Basic Metabolic Panel (BMET)  . CBC w/Diff  . EKG 12-Lead     Disposition:    FU Dr. Loralie Champagne 6 mos.      Signed, Versie Starks, MHS 07/13/2015 12:04 PM    [facility] [address], [place], [place]  [address] Phone: [phone]; Fax: [phone]

## 2015-07-13 ENCOUNTER — Encounter: Payer: Self-pay | Admitting: Physician Assistant

## 2015-07-13 ENCOUNTER — Ambulatory Visit (INDEPENDENT_AMBULATORY_CARE_PROVIDER_SITE_OTHER): Payer: Medicare Other | Admitting: Physician Assistant

## 2015-07-13 VITALS — BP 102/70 | HR 70 | Ht 66.5 in | Wt 167.1 lb

## 2015-07-13 DIAGNOSIS — I739 Peripheral vascular disease, unspecified: Secondary | ICD-10-CM

## 2015-07-13 DIAGNOSIS — I714 Abdominal aortic aneurysm, without rupture, unspecified: Secondary | ICD-10-CM

## 2015-07-13 DIAGNOSIS — I10 Essential (primary) hypertension: Secondary | ICD-10-CM

## 2015-07-13 DIAGNOSIS — K625 Hemorrhage of anus and rectum: Secondary | ICD-10-CM | POA: Diagnosis not present

## 2015-07-13 DIAGNOSIS — E118 Type 2 diabetes mellitus with unspecified complications: Secondary | ICD-10-CM

## 2015-07-13 DIAGNOSIS — E785 Hyperlipidemia, unspecified: Secondary | ICD-10-CM

## 2015-07-13 DIAGNOSIS — I2510 Atherosclerotic heart disease of native coronary artery without angina pectoris: Secondary | ICD-10-CM

## 2015-07-13 DIAGNOSIS — L98499 Non-pressure chronic ulcer of skin of other sites with unspecified severity: Secondary | ICD-10-CM

## 2015-07-13 LAB — CBC WITH DIFFERENTIAL/PLATELET
Basophils Absolute: 0 10*3/uL (ref 0.0–0.1)
Basophils Relative: 0 % (ref 0–1)
Eosinophils Absolute: 0.2 10*3/uL (ref 0.0–0.7)
Eosinophils Relative: 3 % (ref 0–5)
HCT: 41.4 % (ref 39.0–52.0)
Hemoglobin: 14.1 g/dL (ref 13.0–17.0)
Lymphocytes Relative: 21 % (ref 12–46)
Lymphs Abs: 1.6 10*3/uL (ref 0.7–4.0)
MCH: 29.3 pg (ref 26.0–34.0)
MCHC: 34.1 g/dL (ref 30.0–36.0)
MCV: 85.9 fL (ref 78.0–100.0)
MPV: 9.7 fL (ref 8.6–12.4)
Monocytes Absolute: 0.4 10*3/uL (ref 0.1–1.0)
Monocytes Relative: 5 % (ref 3–12)
Neutro Abs: 5.3 10*3/uL (ref 1.7–7.7)
Neutrophils Relative %: 71 % (ref 43–77)
Platelets: 220 10*3/uL (ref 150–400)
RBC: 4.82 MIL/uL (ref 4.22–5.81)
RDW: 13.2 % (ref 11.5–15.5)
WBC: 7.4 10*3/uL (ref 4.0–10.5)

## 2015-07-13 LAB — BASIC METABOLIC PANEL
BUN: 17 mg/dL (ref 7–25)
CO2: 29 mmol/L (ref 20–31)
Calcium: 10 mg/dL (ref 8.6–10.3)
Chloride: 101 mmol/L (ref 98–110)
Creat: 1.32 mg/dL — ABNORMAL HIGH (ref 0.70–1.25)
Glucose, Bld: 125 mg/dL — ABNORMAL HIGH (ref 65–99)
Potassium: 4.1 mmol/L (ref 3.5–5.3)
Sodium: 138 mmol/L (ref 135–146)

## 2015-07-13 NOTE — Patient Instructions (Signed)
Medication Instructions:  1. Your physician recommends that you continue on your current medications as directed. Please refer to the Current Medication list given to you today.   Labwork: TODAY BMET, CBC W/DIFF  Testing/Procedures: NONE  Follow-Up: Your physician wants you to follow-up in: 6 MONTHS DR. Aundra Dubin You will receive a reminder letter in the mail two months in advance. If you don't receive a letter, please call our office to schedule the follow-up appointment.   YOU WILL NEED AN APPT TO SEE [facility] DX RECTAL BLEEDING;  Any Other Special Instructions Will Be Listed Below (If Applicable). IF YOU SEE MORE BLEEDING YOU ARE TO HOLD PLAVIX FOR 5 DAYS THEN GO BACK ON PLAVIX AFTER THE BLEEDING STOPS

## 2015-07-16 ENCOUNTER — Telehealth: Payer: Self-pay | Admitting: *Deleted

## 2015-07-16 DIAGNOSIS — I5032 Chronic diastolic (congestive) heart failure: Secondary | ICD-10-CM

## 2015-07-16 NOTE — Telephone Encounter (Signed)
S/w pt's son Pros in regards to lab results. BMET 10/28.

## 2015-07-17 ENCOUNTER — Ambulatory Visit: Payer: Medicare Other | Admitting: Physician Assistant

## 2015-07-17 ENCOUNTER — Ambulatory Visit (INDEPENDENT_AMBULATORY_CARE_PROVIDER_SITE_OTHER): Payer: Medicare Other | Admitting: Gastroenterology

## 2015-07-17 ENCOUNTER — Encounter: Payer: Self-pay | Admitting: Gastroenterology

## 2015-07-17 VITALS — BP 126/80 | HR 72 | Ht 65.25 in | Wt 167.1 lb

## 2015-07-17 DIAGNOSIS — K625 Hemorrhage of anus and rectum: Secondary | ICD-10-CM

## 2015-07-17 DIAGNOSIS — L98499 Non-pressure chronic ulcer of skin of other sites with unspecified severity: Secondary | ICD-10-CM

## 2015-07-17 DIAGNOSIS — I739 Peripheral vascular disease, unspecified: Secondary | ICD-10-CM

## 2015-07-17 MED ORDER — PEG-KCL-NACL-NASULF-NA ASC-C 100 G PO SOLR: 1.0000 | Freq: Once | ORAL | Status: AC

## 2015-07-17 NOTE — Progress Notes (Signed)
HPI: This is a  very pleasant 64 year old [place] man my meeting for the first time today. He is here with his grandson and a [place] interpreter.   Chief complaint is rectal bleeding  Rectal bleeding about a week ago, occurs with every BM.  No constipation.  No loose stools.  Anal pain.  His cardiologist told him that if he did continue to see rectal bleeding that he should stop his Plavix for 5 days and then resume it. He did stop the Plavix about a week ago and he has not resumed it since he is still having minor BM related rectal bleeding.  He has had mild anal pains.   No abdominal pains.  signficant languarge barrier even with the interpreter   Past Medical History  Diagnosis Date  . Hypertension   . Diabetes mellitus   . Stroke ([place])   . Iliac artery aneurysm, left ([place])   . 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[place])     a. Mesenteric Artery Duplex (7/15):  pSMA chronic dissection with aneurysmal dilation of 1.29 cm ([facility]);  b.  Chest CTA (02/26/14):  IMPRESSION:  1. No aortic dissection or other acute abnormality.  2. 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  3. Stable 17 mm ectasia of the left common iliac artery.  4. Atherosclerosis, including aortoiliac and coronary artery disease.   Marland Kitchen CAD (coronary artery disease)     a. LHC (02/26/14):  mLAD 20 (faint L>R collats), mCFX 50, pRCA 95 (plaque rupture) >> PCI:  Xience DES to pRCA (severe tortuosity of R innominate artery >> needs L radial or FA in future);  b. LHC (03/10/14):  CFX 90, oOM 70, pRCA stent patent >> PCI:  Xience DES to mCFX   . Myocardial infarction ([place])   . Hx of cardiovascular stress test     Lexiscan Myoview (2/16):  Inferior lateral defect c/w thinning vs small prior infarct, no ischemia, EF 49%, Low Risk  . Hx of echocardiogram     a.  Echocardiogram (02/27/14):  Mod focal basal and mild concentric LVH, EF 50-55%, no RWMA, Gr 1 DD, mild TR, normal RVF  . Carotid stenosis     a. Carotid US (02/27/14):   Bilateral ICA 1-39%  . Hyperlipidemia     Past Surgical History  Procedure Laterality Date  . Admission  09/26/2009    CVA.  [name].  . Cardiac catheterization    . Coronary angioplasty    . Left heart catheterization with coronary angiogram N/A 02/26/2014    Procedure: LEFT HEART CATHETERIZATION WITH CORONARY ANGIOGRAM;  Surgeon: Wellington Hampshire, MD;  Location: [facility] CATH LAB;  Service: Cardiovascular;  Laterality: N/A;  . Left heart catheterization with coronary angiogram N/A 03/10/2014    Procedure: LEFT HEART CATHETERIZATION WITH CORONARY ANGIOGRAM;  Surgeon: Jettie Booze, MD;  Location[facility] CATH LAB;  Service: Cardiovascular;  Laterality: N/A;  . Cardiac catheterization N/A 06/25/2015    Procedure: Left Heart Cath and Coronary Angiography;  Surgeon: Burnell Blanks, MD;  Location: [facility] CV LAB;  Service: Cardiovascular;  Laterality: N/A;    Current Outpatient Prescriptions  Medication Sig Dispense Refill  . amLODipine (NORVASC) 5 MG tablet Take 1 tablet (5 mg total) by mouth daily. 30 tablet 11  . aspirin 81 MG EC tablet Take 1 tablet (81 mg total) by mouth daily.    . Blood Glucose Monitoring Suppl (ONE TOUCH ULTRA SYSTEM KIT) W/DEVICE KIT 1 kit by Does not apply route once. One touch  delica lancets, and meter Pt will test once daily. Dx 250.92 1 each 0  . carvedilol (COREG) 6.25 MG tablet Take 1 tablet (6.25 mg total) by mouth 2 (two) times daily with a meal. 60 tablet 3  . clopidogrel (PLAVIX) 75 MG tablet Take 1 tablet (75 mg total) by mouth daily with breakfast. 90 tablet 1  . docusate sodium (COLACE) 100 MG capsule Take 1 capsule (100 mg total) by mouth daily. For constipation 90 capsule 1  . gabapentin (NEURONTIN) 300 MG capsule Take 1 capsule (300 mg total) by mouth 3 (three) times daily. And 2 tabs as needed at night for leg pain 90 capsule 4  . glucose blood (ONE TOUCH ULTRA TEST) test strip Test blood sugar 3 times a day. Dx code: E11.8 100 each 10  .  Insulin Detemir (LEVEMIR FLEXTOUCH) 100 UNIT/ML Pen Inject 10 Units into the skin daily at 10 pm. 9 mL 1  . Insulin Pen Needle (NOVOTWIST) 32G X 5 MM MISC Use 2 per day to inject lantus and victoza 90 each 3  . isosorbide mononitrate (IMDUR) 30 MG 24 hr tablet Take 1 tablet (30 mg total) by mouth daily. 30 tablet 11  . Lancets (ONETOUCH ULTRASOFT) lancets Test blood sugar 3 times daily. Dx code: E11.8 100 each 12  . Liraglutide 18 MG/3ML SOPN Inject 0.2 mLs (1.2 mg total) into the skin daily. 6 mL 3  . lisinopril (PRINIVIL,ZESTRIL) 20 MG tablet Take 1 tablet (20 mg total) by mouth daily. 30 tablet 11  . nitroGLYCERIN (NITROSTAT) 0.4 MG SL tablet Place 1 tablet (0.4 mg total) under the tongue every 5 (five) minutes as needed for chest pain. 25 tablet 1  . nortriptyline (PAMELOR) 25 MG capsule Take 1 capsule (25 mg total) by mouth at bedtime. 30 capsule 3  . pantoprazole (PROTONIX) 40 MG tablet Take 1 tablet (40 mg total) by mouth daily. 30 tablet 3  . polyethylene glycol powder (GLYCOLAX/MIRALAX) powder Take 17 g by mouth 2 (two) times daily as needed for mild constipation. 500 g 11  . rosuvastatin (CRESTOR) 40 MG tablet Take 1 tablet (40 mg total) by mouth daily. 90 tablet 0   No current facility-administered medications for this visit.    Allergies as of 07/17/2015  . (No Known Allergies)    Family History  Problem Relation Age of Onset  . Diabetes Father   . Hypertension Father   . Heart attack Father   . Stroke Neg Hx     Social History   Social History  . Marital Status: Married    Spouse Name: N/A  . Number of Children: 3  . Years of Education: N/A   Occupational History  . Retired    Social History Main Topics  . Smoking status: Former Smoker    Quit date: 06/09/2009  . Smokeless tobacco: Never Used  . Alcohol Use: No  . Drug Use: No  . Sexual Activity: Not Currently   Other Topics Concern  . Not on file   Social History Narrative   Marital: married.       Lives: with son,wife.       Children: 3 children; 6 grandchildren      Employed: unemployed; disability unknown reason/CVA L sided weakness      Tobacco:  Quit 2012; smoked 40 years.       Alcohol: no drinking now; social in past.       Drugs: none       Exercise: sporadic.  ADLs:  No driving since CVA.     Physical Exam: BP 126/80 mmHg  Pulse 72  Ht 5' 5.25" (1.657 m)  Wt 167 lb 2 oz (75.807 kg)  BMI 27.61 kg/m2 Constitutional: generally well-appearing Psychiatric: alert and oriented x3 Abdomen: soft, nontender, nondistended, no obvious ascites, no peritoneal signs, normal bowel sounds Rectal exam deferred for upcoming colonoscopy  Assessment and plan: 64 y.o. male with minor rectal bleeding   I did not mention above but blood counts last week showed normal CBC. He is having minor rectal bleeding, possibly anal rectal, hemorrhoidal in origin. He has never had colonoscopy I recommended we proceed with that at his soonest convenience. He stopped Plavix several days ago and [place] would like to get him in his soonest possible for the colonoscopy. It looks like I can do it mid next week and he will stay off his Plavix until then since he would need to hold it for 5 days prior to the procedure anyway. I see no reason for any further blood tests or imaging studies prior to then. He will stay on aspirin 81 mg once daily. That is safe for him to continue.  Owens Loffler, MD [facility] 07/17/2015, 3:31 PM

## 2015-07-17 NOTE — Patient Instructions (Addendum)
You will be set up for a colonoscopy at [facility] with [place] secation Your cardiologist already wrote (this past week) that it was OK for you to hold the plavix for 5 days prior to the colonoscopy.

## 2015-07-18 DIAGNOSIS — R404 Transient alteration of awareness: Secondary | ICD-10-CM | POA: Diagnosis not present

## 2015-07-18 DIAGNOSIS — R531 Weakness: Secondary | ICD-10-CM | POA: Diagnosis not present

## 2015-07-21 ENCOUNTER — Encounter (HOSPITAL_COMMUNITY): Payer: Self-pay | Admitting: *Deleted

## 2015-07-23 ENCOUNTER — Ambulatory Visit (HOSPITAL_COMMUNITY)
Admission: RE | Admit: 2015-07-23 | Discharge: 2015-07-23 | Disposition: A | Discharge status: Home / Self Care | Payer: Medicare Other | Source: Ambulatory Visit | Attending: Gastroenterology | Admitting: Gastroenterology

## 2015-07-23 ENCOUNTER — Encounter (HOSPITAL_COMMUNITY)
Admission: RE | Disposition: A | Discharge status: Home / Self Care | Payer: Self-pay | Source: Ambulatory Visit | Attending: Gastroenterology

## 2015-07-23 ENCOUNTER — Ambulatory Visit (HOSPITAL_COMMUNITY): Payer: Medicare Other | Admitting: Anesthesiology

## 2015-07-23 ENCOUNTER — Encounter (HOSPITAL_COMMUNITY): Payer: Self-pay

## 2015-07-23 DIAGNOSIS — I110 Hypertensive heart disease with heart failure: Secondary | ICD-10-CM | POA: Diagnosis not present

## 2015-07-23 DIAGNOSIS — K921 Melena: Secondary | ICD-10-CM

## 2015-07-23 DIAGNOSIS — I509 Heart failure, unspecified: Secondary | ICD-10-CM | POA: Insufficient documentation

## 2015-07-23 DIAGNOSIS — Z87891 Personal history of nicotine dependence: Secondary | ICD-10-CM | POA: Insufficient documentation

## 2015-07-23 DIAGNOSIS — E1151 Type 2 diabetes mellitus with diabetic peripheral angiopathy without gangrene: Secondary | ICD-10-CM | POA: Diagnosis not present

## 2015-07-23 DIAGNOSIS — K219 Gastro-esophageal reflux disease without esophagitis: Secondary | ICD-10-CM | POA: Diagnosis not present

## 2015-07-23 DIAGNOSIS — Z7982 Long term (current) use of aspirin: Secondary | ICD-10-CM | POA: Diagnosis not present

## 2015-07-23 DIAGNOSIS — E785 Hyperlipidemia, unspecified: Secondary | ICD-10-CM | POA: Diagnosis not present

## 2015-07-23 DIAGNOSIS — I252 Old myocardial infarction: Secondary | ICD-10-CM | POA: Insufficient documentation

## 2015-07-23 DIAGNOSIS — K649 Unspecified hemorrhoids: Secondary | ICD-10-CM | POA: Diagnosis not present

## 2015-07-23 DIAGNOSIS — Z7902 Long term (current) use of antithrombotics/antiplatelets: Secondary | ICD-10-CM | POA: Insufficient documentation

## 2015-07-23 DIAGNOSIS — K648 Other hemorrhoids: Secondary | ICD-10-CM | POA: Diagnosis not present

## 2015-07-23 DIAGNOSIS — K625 Hemorrhage of anus and rectum: Secondary | ICD-10-CM | POA: Diagnosis not present

## 2015-07-23 DIAGNOSIS — Z794 Long term (current) use of insulin: Secondary | ICD-10-CM | POA: Insufficient documentation

## 2015-07-23 DIAGNOSIS — I2510 Atherosclerotic heart disease of native coronary artery without angina pectoris: Secondary | ICD-10-CM | POA: Insufficient documentation

## 2015-07-23 DIAGNOSIS — Z79899 Other long term (current) drug therapy: Secondary | ICD-10-CM | POA: Insufficient documentation

## 2015-07-23 DIAGNOSIS — Z8673 Personal history of transient ischemic attack (TIA), and cerebral infarction without residual deficits: Secondary | ICD-10-CM | POA: Insufficient documentation

## 2015-07-23 DIAGNOSIS — K5730 Diverticulosis of large intestine without perforation or abscess without bleeding: Secondary | ICD-10-CM | POA: Insufficient documentation

## 2015-07-23 DIAGNOSIS — I739 Peripheral vascular disease, unspecified: Secondary | ICD-10-CM | POA: Diagnosis not present

## 2015-07-23 DIAGNOSIS — D122 Benign neoplasm of ascending colon: Secondary | ICD-10-CM | POA: Diagnosis not present

## 2015-07-23 DIAGNOSIS — D123 Benign neoplasm of transverse colon: Secondary | ICD-10-CM

## 2015-07-23 DIAGNOSIS — K635 Polyp of colon: Secondary | ICD-10-CM | POA: Diagnosis not present

## 2015-07-23 HISTORY — PX: COLONOSCOPY WITH PROPOFOL: SHX5780

## 2015-07-23 LAB — GLUCOSE, CAPILLARY: Glucose-Capillary: 135 mg/dL — ABNORMAL HIGH (ref 65–99)

## 2015-07-23 SURGERY — COLONOSCOPY WITH PROPOFOL
Anesthesia: Monitor Anesthesia Care

## 2015-07-23 MED ORDER — LACTATED RINGERS IV SOLN
INTRAVENOUS | Status: DC
  Administered 2015-07-23: 1000 mL via INTRAVENOUS

## 2015-07-23 MED ORDER — LIDOCAINE HCL (CARDIAC) 20 MG/ML IV SOLN
INTRAVENOUS | Status: AC
  Filled 2015-07-23: qty 5

## 2015-07-23 MED ORDER — SODIUM CHLORIDE 0.9 % IV SOLN: INTRAVENOUS | Status: DC

## 2015-07-23 MED ORDER — PROPOFOL 10 MG/ML IV BOLUS
INTRAVENOUS | Status: AC
  Filled 2015-07-23: qty 20

## 2015-07-23 MED ORDER — PROPOFOL 500 MG/50ML IV EMUL
INTRAVENOUS | Status: DC | PRN
  Administered 2015-07-23: 120 ug/kg/min via INTRAVENOUS

## 2015-07-23 MED ORDER — PROPOFOL 500 MG/50ML IV EMUL
INTRAVENOUS | Status: DC | PRN
  Administered 2015-07-23: 40 mg via INTRAVENOUS

## 2015-07-23 SURGICAL SUPPLY — 21 items

## 2015-07-23 NOTE — H&P (View-Only) (Signed)
HPI: This is a  very pleasant 64 year old [place] man my meeting for the first time today. He is here with his grandson and a [place] interpreter.   Chief complaint is rectal bleeding  Rectal bleeding about a week ago, occurs with every BM.  No constipation.  No loose stools.  Anal pain.  His cardiologist told him that if he did continue to see rectal bleeding that he should stop his Plavix for 5 days and then resume it. He did stop the Plavix about a week ago and he has not resumed it since he is still having minor BM related rectal bleeding.  He has had mild anal pains.   No abdominal pains.  signficant languarge barrier even with the interpreter   Past Medical History  Diagnosis Date  . Hypertension   . Diabetes mellitus   . Stroke ([place])   . Iliac artery aneurysm, left ([place])   . 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[place])     a. Mesenteric Artery Duplex (7/15):  pSMA chronic dissection with aneurysmal dilation of 1.29 cm (V[facility]);  b.  Chest CTA (02/26/14):  IMPRESSION:  1. No aortic dissection or other acute abnormality.  2. 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  3. Stable 17 mm ectasia of the left common iliac artery.  4. Atherosclerosis, including aortoiliac and coronary artery disease.   Marland Kitchen CAD (coronary artery disease)     a. LHC (02/26/14):  mLAD 20 (faint L>R collats), mCFX 50, pRCA 95 (plaque rupture) >> PCI:  Xience DES to pRCA (severe tortuosity of R innominate artery >> needs L radial or FA in future);  b. LHC (03/10/14):  CFX 90, oOM 70, pRCA stent patent >> PCI:  Xience DES to mCFX   . Myocardial infarction ([place])   . Hx of cardiovascular stress test     Lexiscan Myoview (2/16):  Inferior lateral defect c/w thinning vs small prior infarct, no ischemia, EF 49%, Low Risk  . Hx of echocardiogram     a.  Echocardiogram (02/27/14):  Mod focal basal and mild concentric LVH, EF 50-55%, no RWMA, Gr 1 DD, mild TR, normal RVF  . Carotid stenosis     a. Carotid US (02/27/14):   Bilateral ICA 1-39%  . Hyperlipidemia     Past Surgical History  Procedure Laterality Date  . Admission  09/26/2009    CVA.  [name].  . Cardiac catheterization    . Coronary angioplasty    . Left heart catheterization with coronary angiogram N/A 02/26/2014    Procedure: LEFT HEART CATHETERIZATION WITH CORONARY ANGIOGRAM;  Surgeon: Wellington Hampshire, MD;  Location: [facility] CATH LAB;  Service: Cardiovascular;  Laterality: N/A;  . Left heart catheterization with coronary angiogram N/A 03/10/2014    Procedure: LEFT HEART CATHETERIZATION WITH CORONARY ANGIOGRAM;  Surgeon: Jettie Booze, MD;  Location: [facility] CATH LAB;  Service: Cardiovascular;  Laterality: N/A;  . Cardiac catheterization N/A 06/25/2015    Procedure: Left Heart Cath and Coronary Angiography;  Surgeon: Burnell Blanks, MD;  Location: [facility] CV LAB;  Service: Cardiovascular;  Laterality: N/A;    Current Outpatient Prescriptions  Medication Sig Dispense Refill  . amLODipine (NORVASC) 5 MG tablet Take 1 tablet (5 mg total) by mouth daily. 30 tablet 11  . aspirin 81 MG EC tablet Take 1 tablet (81 mg total) by mouth daily.    . Blood Glucose Monitoring Suppl (ONE TOUCH ULTRA SYSTEM KIT) W/DEVICE KIT 1 kit by Does not apply route once. One touch  delica lancets, and meter Pt will test once daily. Dx 250.92 1 each 0  . carvedilol (COREG) 6.25 MG tablet Take 1 tablet (6.25 mg total) by mouth 2 (two) times daily with a meal. 60 tablet 3  . clopidogrel (PLAVIX) 75 MG tablet Take 1 tablet (75 mg total) by mouth daily with breakfast. 90 tablet 1  . docusate sodium (COLACE) 100 MG capsule Take 1 capsule (100 mg total) by mouth daily. For constipation 90 capsule 1  . gabapentin (NEURONTIN) 300 MG capsule Take 1 capsule (300 mg total) by mouth 3 (three) times daily. And 2 tabs as needed at night for leg pain 90 capsule 4  . glucose blood (ONE TOUCH ULTRA TEST) test strip Test blood sugar 3 times a day. Dx code: E11.8 100 each 10  .  Insulin Detemir (LEVEMIR FLEXTOUCH) 100 UNIT/ML Pen Inject 10 Units into the skin daily at 10 pm. 9 mL 1  . Insulin Pen Needle (NOVOTWIST) 32G X 5 MM MISC Use 2 per day to inject lantus and victoza 90 each 3  . isosorbide mononitrate (IMDUR) 30 MG 24 hr tablet Take 1 tablet (30 mg total) by mouth daily. 30 tablet 11  . Lancets (ONETOUCH ULTRASOFT) lancets Test blood sugar 3 times daily. Dx code: E11.8 100 each 12  . Liraglutide 18 MG/3ML SOPN Inject 0.2 mLs (1.2 mg total) into the skin daily. 6 mL 3  . lisinopril (PRINIVIL,ZESTRIL) 20 MG tablet Take 1 tablet (20 mg total) by mouth daily. 30 tablet 11  . nitroGLYCERIN (NITROSTAT) 0.4 MG SL tablet Place 1 tablet (0.4 mg total) under the tongue every 5 (five) minutes as needed for chest pain. 25 tablet 1  . nortriptyline (PAMELOR) 25 MG capsule Take 1 capsule (25 mg total) by mouth at bedtime. 30 capsule 3  . pantoprazole (PROTONIX) 40 MG tablet Take 1 tablet (40 mg total) by mouth daily. 30 tablet 3  . polyethylene glycol powder (GLYCOLAX/MIRALAX) powder Take 17 g by mouth 2 (two) times daily as needed for mild constipation. 500 g 11  . rosuvastatin (CRESTOR) 40 MG tablet Take 1 tablet (40 mg total) by mouth daily. 90 tablet 0   No current facility-administered medications for this visit.    Allergies as of 07/17/2015  . (No Known Allergies)    Family History  Problem Relation Age of Onset  . Diabetes Father   . Hypertension Father   . Heart attack Father   . Stroke Neg Hx     Social History   Social History  . Marital Status: Married    Spouse Name: N/A  . Number of Children: 3  . Years of Education: N/A   Occupational History  . Retired    Social History Main Topics  . Smoking status: Former Smoker    Quit date: 06/09/2009  . Smokeless tobacco: Never Used  . Alcohol Use: No  . Drug Use: No  . Sexual Activity: Not Currently   Other Topics Concern  . Not on file   Social History Narrative   Marital: married.       Lives: with son,wife.       Children: 3 children; 6 grandchildren      Employed: unemployed; disability unknown reason/CVA L sided weakness      Tobacco:  Quit 2012; smoked 40 years.       Alcohol: no drinking now; social in past.       Drugs: none       Exercise: sporadic.  ADLs:  No driving since CVA.     Physical Exam: BP 126/80 mmHg  Pulse 72  Ht 5' 5.25" (1.657 m)  Wt 167 lb 2 oz (75.807 kg)  BMI 27.61 kg/m2 Constitutional: generally well-appearing Psychiatric: alert and oriented x3 Abdomen: soft, nontender, nondistended, no obvious ascites, no peritoneal signs, normal bowel sounds Rectal exam deferred for upcoming colonoscopy  Assessment and plan: 64 y.o. male with minor rectal bleeding   I did not mention above but blood counts last week showed normal CBC. He is having minor rectal bleeding, possibly anal rectal, hemorrhoidal in origin. He has never had colonoscopy I recommended we proceed with that at his soonest convenience. He stopped Plavix several days ago and [facility] would like to get him in his soonest possible for the colonoscopy. It looks like I can do it mid next week and he will stay off his Plavix until then since he would need to hold it for 5 days prior to the procedure anyway. I see no reason for any further blood tests or imaging studies prior to then. He will stay on aspirin 81 mg once daily. That is safe for him to continue.  Owens Loffler, MD [facility] 07/17/2015, 3:31 PM

## 2015-07-23 NOTE — Anesthesia Preprocedure Evaluation (Addendum)
Anesthesia Evaluation  Patient identified by MRN, date of birth, ID band Patient awake    Reviewed: Allergy & Precautions, H&P , NPO status , Patient's Chart, lab work & pertinent test results, reviewed documented beta blocker date and time   Airway Mallampati: II  TM Distance: >3 FB Neck ROM: full    Dental  (+) Dental Advisory Given, Caps All upper front are capped:   Pulmonary neg pulmonary ROS, former smoker,    Pulmonary exam normal breath sounds clear to auscultation       Cardiovascular Exercise Tolerance: Good hypertension, Pt. on medications and Pt. on home beta blockers + CAD, + Past MI, + Peripheral Vascular Disease and +CHF  Normal cardiovascular exam Rhythm:regular Rate:Normal  EF 50%. Diastolic dysfunction.  NSTEMI 6/15   Neuro/Psych Weakness left arm. Carotid stenosis TIACVA, Residual Symptoms negative psych ROS   GI/Hepatic negative GI ROS, Neg liver ROS, GERD  Medicated and Controlled,  Endo/Other  diabetes, Well Controlled, Type 2, Insulin Dependent  Renal/GU negative Renal ROSCRT 1.32  negative genitourinary   Musculoskeletal   Abdominal   Peds  Hematology negative hematology ROS (+)   Anesthesia Other Findings   Reproductive/Obstetrics negative OB ROS                           Anesthesia Physical Anesthesia Plan  ASA: III  Anesthesia Plan: MAC   Post-op Pain Management:    Induction:   Airway Management Planned:   Additional Equipment:   Intra-op Plan:   Post-operative Plan:   Informed Consent: I have reviewed the patients History and Physical, chart, labs and discussed the procedure including the risks, benefits and alternatives for the proposed anesthesia with the patient or authorized representative who has indicated his/her understanding and acceptance.   Dental Advisory Given  Plan Discussed with: CRNA and Surgeon  Anesthesia Plan Comments:          Anesthesia Quick Evaluation

## 2015-07-23 NOTE — Op Note (Signed)
[facility] [place], [id number]   COLONOSCOPY PROCEDURE REPORT  PATIENT: Marquist, Binstock  MR#: [id number] BIRTHDATE: 25-Oct-1950 , 85  yrs. old GENDER: male ENDOSCOPIST: [name], MD PROCEDURE DATE:  07/23/2015 PROCEDURE:   Colonoscopy, diagnostic and Colonoscopy with snare polypectomy First Screening Colonoscopy - Avg.  risk and is 50 yrs.  old or older - No.  Prior Negative Screening - Now for repeat screening. N/A  History of Adenoma - Now for follow-up colonoscopy & has been > or = to 3 yrs.  N/A  Polyps removed today? Yes ASA CLASS:   Class III INDICATIONS:minor rectal bleeding. MEDICATIONS: Monitored anesthesia care  DESCRIPTION OF PROCEDURE:   After the risks benefits and alternatives of the procedure were thoroughly explained, informed consent was obtained.  The digital rectal exam revealed no abnormalities of the rectum.   The [id number] ([id number])  endoscope was introduced through the anus and advanced to the cecum, which was identified by both the appendix and ileocecal valve. No adverse events experienced.   The quality of the prep was adequate  The instrument was then slowly withdrawn as the colon was fully examined. Estimated blood loss is zero unless otherwise noted in this procedure report.   COLON FINDINGS: Two polyps were found, removed and sent to pathology.  These were both sessile, 3-42mm across, located in ascending and transverse segments, removed with cold snare.  There were several left sided diverticulum.  There were medium sized, non-thrombosed internal hemorrhoids.  The examination was otherwise normal.  Retroflexed views revealed no abnormalities. The time to cecum = NA Withdrawal time = NA   The scope was withdrawn and the procedure completed. COMPLICATIONS: There were no immediate complications.  ENDOSCOPIC IMPRESSION: Two polyps were found, removed and sent to pathology.  These were both sessile, 3-51mm across,  located in ascending and transverse segments, removed with cold snare.  There were several left sided diverticulum.  There were medium sized, non-thrombosed internal hemorrhoids.  The examination was otherwise normal  RECOMMENDATIONS: 1. If the polyp(s) removed today are proven to be adenomatous (pre-cancerous) polyps, you will need a repeat colonoscopy in 5 years.  Otherwise you should continue to follow colorectal cancer screening guidelines for "routine risk" patients with colonoscopy in 10 years.  You will receive a letter within 1-2 weeks with the results of your biopsy as well as final recommendations.  Please call my office if you have not received a letter after 3 weeks. 2. The bleeding you see is from your hemorrhoids.  Please start OTC citrucel orange powder fiber supplement once daily.  Stay hydrated. Use OTC preparation H for hemorrhoidal discomfort as needed. Please call my office to schedule repeat visit in 2-3 months.  eSigned:  [name], MD 07/23/2015 10:09 AM     PATIENT NAME:  Jamiah, Recore MR#: [id number]

## 2015-07-23 NOTE — Interval H&P Note (Signed)
History and Physical Interval Note:  07/23/2015 9:08 AM  Barry [name]  has presented today for surgery, with the diagnosis of Rectal bleeding  The various methods of treatment have been discussed with the patient and family. After consideration of risks, benefits and other options for treatment, the patient has consented to  Procedure(s): COLONOSCOPY WITH PROPOFOL (N/A) as a surgical intervention .  The patient's history has been reviewed, patient examined, no change in status, stable for surgery.  I have reviewed the patient's chart and labs.  Questions were answered to the patient's satisfaction.     [name]

## 2015-07-23 NOTE — Discharge Instructions (Signed)
Polyp ??i Trng (Colon Polyps) Polyp l c?c u c?a m ph? pht tri?n bn trong c? th?. Polyp c th? pht tri?n trong ru?t gi (??i trng). H?u h?t polyp ??i trng khng ph?i l ung th? (lnh tnh). Tuy nhin, m?t s? polyp ??i trng c th? chuy?n [place] th? sau m?t th?i gian. Polyp l?n h?n h?t ??u c th? c h?i. ?? ???c an ton, chuyn gia ch?m [place] s?c kh?e s? c?t b? v ki?m tra t?t c? cc polyp. NGUYN NHN Polyp hnh thnh khi ??t bi?n ? cc gen khi?n cho cc t? bo pht tri?n v phn chia m?c d khng c?n thm m. CC Y?U T? NGUY C? C m?t s? y?u t? nguy c? c th? lm t?ng kh? n?ng b?n b? polyp ??i trng. Chng bao g?m:  Trn 49 tu?i.  B?nh s? gia ?nh b? polyp ??i trng v ung th? ??i trng.  B?nh ??i trng ko di, ch?ng h?n nh? vim ??i trng hay b?nh [place].  Th?a cn.  Ht thu?c l.  t v?n ??ng.  U?ng qu nhi?u r??u. TRI?U CH?NG H?u h?t cc polyp nh? khng gy tri?u ch?ng. N?u c tri?u ch?ng, chng c th? bao g?m:  Mu trong phn. Phn c th? c mu ?? s?m ho?c ?en.  To bn ho?c tiu ch?y ko di h?n 1 tu?n. CH?N ?ON Ng??i b?nh th??ng khng bi?t h? c polyp cho ??n khi chuyn gia ch?m [place] s?c kh?e pht hi?n th?y chng trong m?t l?n ki?m tra s?c kh?e ??nh k?. Chuyn gia ch?m [place] s?c kh?e c th? s? d?ng 4 lo?i ki?m tra ?? ki?m tra xem c polyp hay khng:  Khm tr?c trng b?ng ngn tay. Chuyn gia ch?m [place] s?c kh?e s? ?eo g?ng tay v c?m nh?n bn trong tr?c trng. Ki?m tra ny s? ch? pht hi?n polyp trong tr?c trng.  Th?t bari. Chuyn gia ch?m [place] s?c kh?e ??a m?t ch?t l?ng c tn l bari vo tr?c trng tr??c khi ch?p [place] ??i trng. Bari lm cho ru?t gi trng c mu tr?ng. Polyp c mu s?m, v v?y chng d? dng ???c pht hi?n trong cc ?nh ch?p [place].  N?i soi ??i trng sigma. M?t ?ng m?ng, d?o (?ng soi ??i trng sigma) ???c ??t vo tr?c trng. ?ng soi ??i trng sigma c ?n v camera r?t nh? bn trong. Chuyn gia ch?m [place] s?c kh?e s? d?ng ?ng soi ??i trng sigma ??  soi ph?n ba cu?i c?a ??i trng.  N?i soi ??i trng. Ki?m tra ny gi?ng nh? soi ??i trng sigma, khc bi?t ? ch? chuyn gia ch?m [place] s?c kh?e c th? xem xt ton b? ??i trng. ?y l ph??ng php ph? bi?n nh?t ?? pht hi?n v c?t b? polyp. ?I?U TR? M?i polyp s? ???c c?t b? trong qu trnh n?i soi ??i trng sigma ho?c n?i soi ??i trng. Sau ? polyp ???c xt nghi?m xem c ung th? khng. PHNG NG?A ?? gip gi?m nguy c? b? thm nhi?u polyp ??i trng:  ?n nhi?u tri cy v rau qu?Marland Kitchen Trnh ?n th?c ?n c nhi?u ch?t bo.  Khng ht thu?c l.  Trnh u?ng r??u.  T?p th? d?c hng ngy.  Gi?m cn n?u ???c ?? ngh? b?i chuyn gia ch?m [place] s?c kh?e.  ?n nhi?u canxi v folate. Th?c ph?m giu canxi bao g?m s?a, pho mt v bng c?i xanh. Th?c ph?m giu folate bao g?m ??u xanh, ??u ty v rau chn v?t. H??NG D?N CH?M  [place] T?I NH Gi? m?i cu?c h?n khm l?i theo ch? d?n c?a chuyn gia ch?m [place] s?c kh?e. B?n c th? c?n th?m khm ??nh k? ?? ki?m tra polyp. HY ?I KHM N?U: B?n th?y c ch?y mu trong khi ??i ti?n.   Thng tin ny khng nh?m m?c ?ch thay th? cho l?i khuyn m chuyn gia ch?m [place] s?c kh?e ni v?i qu v?. Hy b?o ??m qu v? ph?i th?o lu?n b?t k? v?n ?? g m qu v? c v?i chuyn gia ch?m [place] s?c kh?e c?a qu v?.   Document Released: 03/13/2012 Document Revised: 05/15/2013 Elsevier Interactive Patient Education 2016 [place] ru?t k?t, Ch?m [place] sau khi lm th? thu?t (Colonoscopy, Care After) Tham kh?o t? thng tin ny trong vi tu?n t?i. Nh?ng h??ng d?n ny cung c?p cho qu v? thng tin v? cch ch?m [place] b?n thn sau khi lm th? thu?t. Chuyn gia ch?m [place] s?c kh?e c?ng c th? h??ng d?n c? th? h?n cho qu v?. Vi?c ?i?u tr? c?a qu v? ? ???c ln k? ho?ch theo th?c hnh y khoa hi?n t?i, nh?ng v?n ?? ?i khi v?n x?y ra. Hy g?i cho chuyn gia ch?m [place] s?c kh?e n?u qu v? c b?t k? v?n ?? ho?c th?c m?c no sau khi lm th? thu?t. K? V?NG ?I?U G SAU TH? THU?T  Sau khi lm th? thu?t th??ng c  nh?ng v?n ?? sau:  M?t l??ng mu nh? trong phn.  ?nh h?i m?t cht v b? ?au th?t ho?c ch??ng b?ng nh?. H??NG D?N CH?M [place] T?I NH  Khng li xe, v?n hnh my mc, ho?c k cc gi?y t? quan tr?ng trong 24 gi?Sander Nephew v? c th? t?m vi hoa sen ho?c tr? l?i cc ho?t ??ng th? ch?t thng th??ng, nh?ng c? ??ng v?i m?t t?c ?? ch?m h?n trong 24 gi? ??u tin.  Th??ng xuyn ngh? ng?i trong 24 gi? ??u tin.  ?i d?o xung quanh v ??t m?t ti ?m ln b?ng ?? gip gi?m ?au co th?t ho?c ch??ng b?ng.  U?ng ?? n??c ?? gi? cho n??c ti?u trong ho?c vng nh?t.  Qu v? c th? tr? l?i v?i ch? ?? ?n u?ng bnh th??ng theo ch? d?n c?a chuyn gia ch?m [place] s?c kh?e. Trnh cc mn ?n n?ng ho?c chin rn kh tiu ha.  Trnh u?ng r??u trong 24 gi? ho?c theo h??ng d?n c?a chuyn gia ch?m [place] s?c kh?e.  Ch? s? d?ng thu?c khng c?n k ??n ho?c thu?c c?n k ??n theo ch? d?n c?a chuyn gia ch?m [place] s?c kh?e.  N?u ph?i l?y m?t m?u m (sinh thi?t) trong lc lm th? thu?t:  Khng dng aspirin ho?c thu?c lm long mu trong vng 7 ngy, ho?c theo ch? d?n c?a chuyn gia ch?m  s?c kh?e.  Khng u?ng r??u trong 7 ngy, ho?c theo h??ng d?n c?a chuyn gia ch?m  s?c kh?e.  ?n th?c ?n m?m trong 24 gi? ??u. ?I KHM N?U: Qu v? b? r?m mu lin t?c trong phn 2 - 3 ngy sau khi lm th? thu?t. NGAY L?P T?C ?I KHM N?U:  Qu v? nhi?u l?n c r?m mu trong phn.  ?i ??i ti?n c c?c mu ?ng trong phn.  B?ng qu v? ph?ng ln (ch??ng c?ng).  Qu v? b? bu?n nn ho?c nn m?a.  Qu v? b? s?t.  Qu v? b? ?au b?ng t?ng ln, khng thuyn gi?m sau khi dng thu?c.   Thng tin ny khng nh?m m?c ?  ch thay th? cho l?i khuyn m chuyn gia ch?m [place] s?c kh?e ni v?i qu v?. Hy b?o ??m qu v? ph?i th?o lu?n b?t k? v?n ?? g m qu v? c v?i chuyn gia ch?m [place] s?c kh?e c?a qu v?.   Document Released: 07/03/2013 Elsevier Interactive Patient Education [employer].

## 2015-07-23 NOTE — Transfer of Care (Signed)
Immediate Anesthesia Transfer of Care Note  Patient: Barry [name]  Procedure(s) Performed: Procedure(s): COLONOSCOPY WITH PROPOFOL (N/A)  Patient Location: PACU  Anesthesia Type:MAC  Level of Consciousness: awake, alert  and oriented  Airway & Oxygen Therapy: Patient Spontanous Breathing and Patient connected to face mask oxygen  Post-op Assessment: Report given to RN and Post -op Vital signs reviewed and stable  Post vital signs: Reviewed and stable  Last Vitals:  Filed Vitals:   07/23/15 0800  BP: 187/85  Pulse: 72  Temp: 37 C  Resp: 15    Complications: No apparent anesthesia complications

## 2015-07-23 NOTE — Anesthesia Postprocedure Evaluation (Signed)
  Anesthesia Post-op Note  Patient: Barry [name]  Procedure(s) Performed: Procedure(s) (LRB): COLONOSCOPY WITH PROPOFOL (N/A)  Patient Location: PACU  Anesthesia Type: MAC  Level of Consciousness: awake and alert   Airway and Oxygen Therapy: Patient Spontanous Breathing  Post-op Pain: mild  Post-op Assessment: Post-op Vital signs reviewed, Patient's Cardiovascular Status Stable, Respiratory Function Stable, Patent Airway and No signs of Nausea or vomiting  Last Vitals:  Filed Vitals:   07/23/15 1020  BP: 144/84  Pulse: 61  Temp:   Resp: 13    Post-op Vital Signs: stable   Complications: No apparent anesthesia complications

## 2015-07-24 ENCOUNTER — Other Ambulatory Visit (INDEPENDENT_AMBULATORY_CARE_PROVIDER_SITE_OTHER): Payer: Medicare Other

## 2015-07-24 ENCOUNTER — Encounter (HOSPITAL_COMMUNITY): Payer: Self-pay | Admitting: Gastroenterology

## 2015-07-24 DIAGNOSIS — I509 Heart failure, unspecified: Secondary | ICD-10-CM | POA: Diagnosis not present

## 2015-07-24 DIAGNOSIS — I5032 Chronic diastolic (congestive) heart failure: Secondary | ICD-10-CM | POA: Diagnosis not present

## 2015-07-24 LAB — BASIC METABOLIC PANEL
BUN: 22 mg/dL (ref 7–25)
CO2: 27 mmol/L (ref 20–31)
Calcium: 9.7 mg/dL (ref 8.6–10.3)
Chloride: 103 mmol/L (ref 98–110)
Creat: 1.33 mg/dL — ABNORMAL HIGH (ref 0.70–1.25)
Glucose, Bld: 92 mg/dL (ref 65–99)
Potassium: 4.2 mmol/L (ref 3.5–5.3)
Sodium: 139 mmol/L (ref 135–146)

## 2015-07-27 ENCOUNTER — Encounter: Payer: Self-pay | Admitting: Family Medicine

## 2015-07-28 ENCOUNTER — Telehealth: Payer: Self-pay | Admitting: *Deleted

## 2015-07-28 NOTE — Telephone Encounter (Signed)
Lmptcb for pt's son Pros who helps interpret for pt.

## 2015-07-28 NOTE — Telephone Encounter (Signed)
lmptcb x 2 lmom lab ok, kidney function stable

## 2015-07-29 ENCOUNTER — Encounter: Payer: Self-pay | Admitting: Gastroenterology

## 2015-09-24 ENCOUNTER — Other Ambulatory Visit: Payer: Self-pay | Admitting: Family Medicine

## 2015-09-27 DIAGNOSIS — Z9289 Personal history of other medical treatment: Secondary | ICD-10-CM

## 2015-09-27 DIAGNOSIS — I5020 Unspecified systolic (congestive) heart failure: Secondary | ICD-10-CM

## 2015-09-27 HISTORY — DX: Personal history of other medical treatment: Z92.89

## 2015-09-27 HISTORY — DX: Unspecified systolic (congestive) heart failure: I50.20

## 2015-09-28 ENCOUNTER — Ambulatory Visit (INDEPENDENT_AMBULATORY_CARE_PROVIDER_SITE_OTHER): Payer: Medicare Other | Admitting: Family Medicine

## 2015-09-28 VITALS — BP 130/84 | HR 73 | Temp 97.9°F | Resp 16 | Ht 65.5 in | Wt 168.6 lb

## 2015-09-28 DIAGNOSIS — I10 Essential (primary) hypertension: Secondary | ICD-10-CM

## 2015-09-28 DIAGNOSIS — E118 Type 2 diabetes mellitus with unspecified complications: Secondary | ICD-10-CM | POA: Diagnosis not present

## 2015-09-28 DIAGNOSIS — Z794 Long term (current) use of insulin: Secondary | ICD-10-CM

## 2015-09-28 LAB — BASIC METABOLIC PANEL
BUN: 14 mg/dL (ref 7–25)
CO2: 29 mmol/L (ref 20–31)
Calcium: 9.5 mg/dL (ref 8.6–10.3)
Chloride: 100 mmol/L (ref 98–110)
Creat: 1.24 mg/dL (ref 0.70–1.25)
Glucose, Bld: 100 mg/dL — ABNORMAL HIGH (ref 65–99)
Potassium: 4.2 mmol/L (ref 3.5–5.3)
Sodium: 139 mmol/L (ref 135–146)

## 2015-09-28 LAB — HEMOGLOBIN A1C
Hgb A1c MFr Bld: 7.6 % — ABNORMAL HIGH (ref <5.7)
Mean Plasma Glucose: 171 mg/dL — ABNORMAL HIGH (ref <117)

## 2015-09-28 MED ORDER — CARVEDILOL 6.25 MG PO TABS: 6.2500 mg | ORAL_TABLET | Freq: Two times a day (BID) | ORAL | Status: DC

## 2015-09-28 MED ORDER — ONETOUCH ULTRASOFT LANCETS MISC: Status: DC

## 2015-09-28 MED ORDER — INSULIN DETEMIR 100 UNIT/ML FLEXPEN: 10.0000 [IU] | PEN_INJECTOR | Freq: Every day | SUBCUTANEOUS | Status: DC

## 2015-09-28 MED ORDER — GLUCOSE BLOOD VI STRP: ORAL_STRIP | Status: DC

## 2015-09-28 NOTE — Progress Notes (Signed)
[facility] [address] [place] [address] [phone]  Date:  09/28/2015   Name:  Barry [name]   DOB:  24-Jan-1951   MRN:  [id number]  PCP:  Lamar Blinks, MD    Chief Complaint: Medication Refill   History of Present Illness:  Barry [name] is a 65 y.o. very pleasant male patient who presents with the following:  Here today for a recheck and medication refills.  I last saw this pt 03/2014. He needs a RF of his carvedilol, test strips and insulin.  They had called for RF but were asked to come in as they had not been seen in over a year.   Recently had colon surgery per Dr. Ardis Hughs- they report that polyps were removed and he is doing well in this regard  He is due for a flu shot today- he refused to have this done today  He is feeling well today overall and does not have any complaints.  He does not speak English but his daughter is with him and provides interpretation Lab Results  Component Value Date   HGBA1C 6.9 07/01/2015     Patient Active Problem List   Diagnosis Date Noted  . Chest pain 06/25/2015  . Pain in the chest   . Atherosclerotic peripheral vascular disease with ulceration ([place]) 04/03/2015  . Special screening for malignant neoplasms, colon 05/07/2014  . CAD (coronary artery disease), native coronary artery 03/28/2014  . Aneurysm of iliac artery ([place]) 03/18/2014  . Abdominal pain, unspecified site 03/18/2014  . Syncope in setting of nausea and vomiting 03/07/14 03/08/2014  . Nausea and vomiting 03/07/14 03/08/2014  . Chest pain with moderate risk of acute coronary syndrome 03/07/2014  . CAD- RCA BMS 02/27/14 02/28/2014  . Non compliance with medical treatment 02/28/2014  . Dyslipidemia, goal LDL below 70 02/28/2014  . Bradycardia- beta blocker decreased 02/28/2014  . Acute on chronic diastolic CHF (congestive heart failure), NYHA class 3 - due to NSTEMI 02/28/2014  . Diastolic CHF ([place]) 08/67/6195  . Type 2 diabetes mellitus with  manifestations ([place]) 02/27/2014  . TIA (transient ischemic attack) 02/26/2014  . NSTEMI- 02/27/14 02/26/2014  . Pain in limb-Abdomen  12/06/2013  . Weakness-Left arm / Lef 12/06/2013  . Mesenteric artery stenosis ([place]) 12/06/2013  . 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[place]) 12/03/2012  . Hypertension 11/10/2011  . CVA (cerebral vascular accident) ([place]) 11/10/2011  . Language barrier 11/10/2011    Past Medical History  Diagnosis Date  . Hypertension   . Diabetes mellitus   . Stroke ([place])   . Iliac artery aneurysm, left ([place])   . 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[place])     a. Mesenteric Artery Duplex (7/15):  pSMA chronic dissection with aneurysmal dilation of 1.29 cm (VVS);  b.  Chest CTA (02/26/14):  IMPRESSION:  1. No aortic dissection or other acute abnormality.  2. 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  3. Stable 17 mm ectasia of the left common iliac artery.  4. Atherosclerosis, including aortoiliac and coronary artery disease.   Marland Kitchen CAD (coronary artery disease)     a. LHC (02/26/14):  mLAD 20 (faint L>R collats), mCFX 50, pRCA 95 (plaque rupture) >> PCI:  Xience DES to pRCA (severe tortuosity of R innominate artery >> needs L radial or FA in future);  b. LHC (03/10/14):  CFX 90, oOM 70, pRCA stent patent >> PCI:  Xience DES to mCFX   . Myocardial infarction ([place])   . Hx of cardiovascular  stress test     Lexiscan Myoview (2/16):  Inferior lateral defect c/w thinning vs small prior infarct, no ischemia, EF 49%, Low Risk  . Hx of echocardiogram     a.  Echocardiogram (02/27/14):  Mod focal basal and mild concentric LVH, EF 50-55%, no RWMA, Gr 1 DD, mild TR, normal RVF  . Carotid stenosis     a. Carotid US (02/27/14):  Bilateral ICA 1-39%  . Hyperlipidemia     Past Surgical History  Procedure Laterality Date  . Admission  09/26/2009    CVA.  [name].  . Cardiac catheterization    . Coronary angioplasty    . Left heart catheterization with coronary angiogram N/A 02/26/2014     Procedure: LEFT HEART CATHETERIZATION WITH CORONARY ANGIOGRAM;  Surgeon: Wellington Hampshire, MD;  Location: [facility] CATH LAB;  Service: Cardiovascular;  Laterality: N/A;  . Left heart catheterization with coronary angiogram N/A 03/10/2014    Procedure: LEFT HEART CATHETERIZATION WITH CORONARY ANGIOGRAM;  Surgeon: Jettie Booze, MD;  Location: [facility] CATH LAB;  Service: Cardiovascular;  Laterality: N/A;  . Cardiac catheterization N/A 06/25/2015    Procedure: Left Heart Cath and Coronary Angiography;  Surgeon: Burnell Blanks, MD;  Location: [facility] CV LAB;  Service: Cardiovascular;  Laterality: N/A;  . Colonoscopy with propofol N/A 07/23/2015    Procedure: COLONOSCOPY WITH PROPOFOL;  Surgeon: [name], MD;  Location: [facility] ENDOSCOPY;  Service: Endoscopy;  Laterality: N/A;    Social History  Substance Use Topics  . Smoking status: Former Smoker    Quit date: 06/09/2009  . Smokeless tobacco: Never Used  . Alcohol Use: No    Family History  Problem Relation Age of Onset  . Diabetes Father   . Hypertension Father   . Heart attack Father   . Stroke Neg Hx     No Known Allergies  Medication list has been reviewed and updated.  Current Outpatient Prescriptions on File Prior to Visit  Medication Sig Dispense Refill  . amLODipine (NORVASC) 5 MG tablet Take 1 tablet (5 mg total) by mouth daily. 30 tablet 11  . aspirin 81 MG EC tablet Take 1 tablet (81 mg total) by mouth daily.    . Blood Glucose Monitoring Suppl (ONE TOUCH ULTRA SYSTEM KIT) W/DEVICE KIT 1 kit by Does not apply route once. One touch delica lancets, and meter Pt will test once daily. Dx 250.92 1 each 0  . carvedilol (COREG) 6.25 MG tablet TAKE ONE TABLET BY MOUTH TWICE DAILY WITH A MEAL 60 tablet 2  . clopidogrel (PLAVIX) 75 MG tablet Take 1 tablet (75 mg total) by mouth daily with breakfast. 90 tablet 1  . docusate sodium (COLACE) 100 MG capsule Take 1 capsule (100 mg total) by mouth daily. For constipation 90 capsule  1  . gabapentin (NEURONTIN) 300 MG capsule Take 1 capsule (300 mg total) by mouth 3 (three) times daily. And 2 tabs as needed at night for leg pain 90 capsule 4  . glucose blood (ONE TOUCH ULTRA TEST) test strip Test blood sugar 3 times a day. Dx code: E11.8 100 each 10  . Insulin Detemir (LEVEMIR FLEXTOUCH) 100 UNIT/ML Pen Inject 10 Units into the skin daily at 10 pm. 9 mL 1  . Insulin Pen Needle (NOVOTWIST) 32G X 5 MM MISC Use 2 per day to inject lantus and victoza 90 each 3  . isosorbide mononitrate (IMDUR) 30 MG 24 hr tablet Take 1 tablet (30 mg total) by mouth daily.  30 tablet 11  . Lancets (ONETOUCH ULTRASOFT) lancets Test blood sugar 3 times daily. Dx code: E11.8 100 each 12  . lisinopril (PRINIVIL,ZESTRIL) 20 MG tablet Take 1 tablet (20 mg total) by mouth daily. 30 tablet 11  . nitroGLYCERIN (NITROSTAT) 0.4 MG SL tablet Place 1 tablet (0.4 mg total) under the tongue every 5 (five) minutes as needed for chest pain. 25 tablet 1  . pantoprazole (PROTONIX) 40 MG tablet Take 1 tablet (40 mg total) by mouth daily. 30 tablet 3  . polyethylene glycol powder (GLYCOLAX/MIRALAX) powder Take 17 g by mouth 2 (two) times daily as needed for mild constipation. 500 g 11  . rosuvastatin (CRESTOR) 40 MG tablet Take 1 tablet (40 mg total) by mouth daily. 90 tablet 0  . Liraglutide 18 MG/3ML SOPN Inject 0.2 mLs (1.2 mg total) into the skin daily. (Patient not taking: Reported on 09/28/2015) 6 mL 3  . nortriptyline (PAMELOR) 25 MG capsule Take 1 capsule (25 mg total) by mouth at bedtime. (Patient not taking: Reported on 09/28/2015) 30 capsule 3   No current facility-administered medications on file prior to visit.    Review of Systems:  As per HPI- otherwise negative.   Physical Examination: Filed Vitals:   09/28/15 1119  BP: 130/84  Pulse: 73  Temp: 97.9 F (36.6 C)  Resp: 16   Filed Vitals:   09/28/15 1119  Height: 5' 5.5" (1.664 m)  Weight: 168 lb 9.6 oz (76.476 kg)   Body mass index is 27.62  kg/(m^2). Ideal Body Weight: Weight in (lb) to have BMI = 25: 152.2  GEN: WDWN, NAD, Non-toxic, A & O x 3, looks well, multiple home-done tattoos likely from [place].  HEENT: Atraumatic, Normocephalic. Neck supple. No masses, No LAD. Ears and Nose: No external deformity. CV: RRR, No M/G/R. No JVD. No thrill. No extra heart sounds. PULM: CTA B, no wheezes, crackles, rhonchi. No retractions. No resp. distress. No accessory muscle use. EXTR: No c/c/e NEURO Normal gait.  PSYCH: Normally interactive. Conversant. Not depressed or anxious appearing.  Calm demeanor.   Assessment and Plan: Controlled type 2 diabetes mellitus with complication, with long-term current use of insulin (HCC) - Plan: Lancets (ONETOUCH ULTRASOFT) lancets, Insulin Detemir (LEVEMIR FLEXTOUCH) 100 UNIT/ML Pen, glucose blood (ONE TOUCH ULTRA TEST) test strip, Basic metabolic panel, Hemoglobin A1c  Essential hypertension - Plan: carvedilol (COREG) 6.25 MG tablet  Refilled his medication as above Check A1c, noted slightly elevated creat on recent labs and will check this for him today as well.  Will plan further follow- up pending labs. BP is controlled  Signed Lamar Blinks, MD

## 2015-09-28 NOTE — Patient Instructions (Signed)
It was good to see you today- I will be in touch with your labs Please consider getting a flu shot soon [place] that you do not get the flu!  You also need a pneumonia shot and an eye exam Please see [place] for a complete physical in the next few months   Take care!

## 2015-09-30 ENCOUNTER — Other Ambulatory Visit: Payer: Self-pay

## 2015-09-30 ENCOUNTER — Encounter: Payer: Self-pay | Admitting: Family Medicine

## 2015-09-30 MED ORDER — LANCETS MISC: Status: DC

## 2015-10-07 ENCOUNTER — Ambulatory Visit (INDEPENDENT_AMBULATORY_CARE_PROVIDER_SITE_OTHER): Payer: Medicare Other | Admitting: Urgent Care

## 2015-10-07 VITALS — BP 120/70 | HR 86 | Temp 98.4°F | Resp 20 | Ht 66.0 in | Wt 170.2 lb

## 2015-10-07 DIAGNOSIS — E118 Type 2 diabetes mellitus with unspecified complications: Secondary | ICD-10-CM

## 2015-10-07 DIAGNOSIS — R079 Chest pain, unspecified: Secondary | ICD-10-CM | POA: Diagnosis not present

## 2015-10-07 DIAGNOSIS — Z794 Long term (current) use of insulin: Secondary | ICD-10-CM | POA: Diagnosis not present

## 2015-10-07 LAB — POCT CBC
Granulocyte percent: 64.8 %G (ref 37–80)
HCT, POC: 43.6 % (ref 43.5–53.7)
Hemoglobin: 14.8 g/dL (ref 14.1–18.1)
Lymph, poc: 1.8 (ref 0.6–3.4)
MCH, POC: 27.9 pg (ref 27–31.2)
MCHC: 34 g/dL (ref 31.8–35.4)
MCV: 82.1 fL (ref 80–97)
MID (cbc): 0.4 (ref 0–0.9)
MPV: 6.9 fL (ref 0–99.8)
POC Granulocyte: 4 (ref 2–6.9)
POC LYMPH PERCENT: 28.5 %L (ref 10–50)
POC MID %: 6.7 %M (ref 0–12)
Platelet Count, POC: 230 10*3/uL (ref 142–424)
RBC: 5.31 M/uL (ref 4.69–6.13)
RDW, POC: 13.7 %
WBC: 6.2 10*3/uL (ref 4.6–10.2)

## 2015-10-07 LAB — POCT URINALYSIS DIP (MANUAL ENTRY)
Bilirubin, UA: NEGATIVE
Glucose, UA: 250 — AB
Ketones, POC UA: NEGATIVE
Leukocytes, UA: NEGATIVE
Nitrite, UA: NEGATIVE
Protein Ur, POC: NEGATIVE
Spec Grav, UA: 1.02
Urobilinogen, UA: 0.2
pH, UA: 5.5

## 2015-10-07 LAB — GLUCOSE, POCT (MANUAL RESULT ENTRY): POC Glucose: 148 mg/dl — AB (ref 70–99)

## 2015-10-07 NOTE — Progress Notes (Signed)
MRN: [id number] DOB: 1950-10-29  Subjective:   Barry [name] is a 65 y.o. male with pmh of CAD, s/p 2 stents presenting for chief complaint of Hypertension  Reports 2 day history of intermittent sharp chest pain relieved with SL nitro. Patient has a history of diabetes, reports that his blood sugar has been elevated the past 2 days with fasting levels >200. His last A1c was 7.6 on 09/28/2015. His last visit with [facility] was 06/2015. Denies diaphoresis, radiation of his chest pain into neck, jaw or limb. Also denies n/v, abdominal pain, shob, lower leg swelling. Patient is no longer a smoker.   Barry [name] has a current medication list which includes the following prescription(s): amlodipine, aspirin, one touch ultra system kit, carvedilol, clopidogrel, docusate sodium, gabapentin, glucose blood, insulin detemir, insulin pen needle, isosorbide mononitrate, lancets, liraglutide, lisinopril, nitroglycerin, nortriptyline, pantoprazole, polyethylene glycol powder, and rosuvastatin. Also has No Known Allergies.  Barry [name]  has a past medical history of Hypertension; Diabetes mellitus; Stroke ([place]); Iliac artery aneurysm, left ([place]); 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HCC); CAD (coronary artery disease); Myocardial infarction [facility] [place]); cardiovascular stress test; echocardiogram; Carotid stenosis; and Hyperlipidemia. Also  has past surgical history that includes Admission (09/26/2009); Cardiac catheterization; Coronary angioplasty; left heart catheterization with coronary angiogram (N/A, 02/26/2014); left heart catheterization with coronary angiogram (N/A, 03/10/2014); Cardiac catheterization (N/A, 06/25/2015); and Colonoscopy with propofol (N/A, 07/23/2015).  Objective:   Vitals: BP 120/70 mmHg  Pulse 86  Temp(Src) 98.4 F (36.9 C) (Oral)  Resp 20  Ht 5' 6"  (1.676 m)  Wt 170 lb 3.2 oz (77.202 kg)  BMI 27.48 kg/m2  SpO2 98%  Physical Exam  Constitutional: He is oriented to person, place, and time. He  appears well-developed and well-nourished.  HENT:  Mouth/Throat: Oropharynx is clear and moist.  Eyes: Right eye exhibits no discharge. Left eye exhibits no discharge. No scleral icterus.  Neck: Normal range of motion. Neck supple. No JVD present.  Cardiovascular: Normal rate, regular rhythm and intact distal pulses.  Exam reveals no gallop and no friction rub.   No murmur heard. Pulmonary/Chest: No respiratory distress. He has no wheezes. He has no rales.  Abdominal: Soft. Bowel sounds are normal. He exhibits no distension and no mass. There is no tenderness.  Musculoskeletal: He exhibits no edema.  Neurological: He is alert and oriented to person, place, and time.  Skin: Skin is warm and dry. No rash noted. No erythema. No pallor.   ECG interpretation by Dr. Everlene Farrier and PA-Nivia Gervase - wide QRS in V1-V3, old q-wave in lead III. Largely unchanged ecg from 06/2015.  Results for orders placed or performed in visit on 10/07/15 (from the past 24 hour(s))  POCT urinalysis dipstick     Status: Abnormal   Collection Time: 10/07/15  2:23 PM  Result Value Ref Range   Color, UA yellow yellow   Clarity, UA clear clear   Glucose, UA =250 (A) negative   Bilirubin, UA negative negative   Ketones, POC UA negative negative   Spec Grav, UA 1.020    Blood, UA small (A) negative   pH, UA 5.5    Protein Ur, POC negative negative   Urobilinogen, UA 0.2    Nitrite, UA Negative Negative   Leukocytes, UA Negative Negative  POCT glucose (manual entry)     Status: Abnormal   Collection Time: 10/07/15  2:51 PM  Result Value Ref Range   POC Glucose 148 (A) 70 - 99 mg/dl  POCT CBC  Status: None   Collection Time: 10/07/15  2:51 PM  Result Value Ref Range   WBC 6.2 4.6 - 10.2 K/uL   Lymph, poc 1.8 0.6 - 3.4   POC LYMPH PERCENT 28.5 10 - 50 %L   MID (cbc) 0.4 0 - 0.9   POC MID % 6.7 0 - 12 %M   POC Granulocyte 4.0 2 - 6.9   Granulocyte percent 64.8 37 - 80 %G   RBC 5.31 4.69 - 6.13 M/uL   Hemoglobin 14.8  14.1 - 18.1 g/dL   HCT, POC 43.6 43.5 - 53.7 %   MCV 82.1 80 - 97 fL   MCH, POC 27.9 27 - 31.2 pg   MCHC 34.0 31.8 - 35.4 g/dL   RDW, POC 13.7 %   Platelet Count, POC 230 142 - 424 K/uL   MPV 6.9 0 - 99.8 fL   Assessment and Plan :   This case was precepted with Dr. Everlene Farrier.  1. Chest pain, unspecified chest pain type - Physical exam, ecg reassuring. Patient was scheduled by our staff with PA-Scott Kathlen Mody with [facility] for a visit within 1 week. Counseled patient on worrisome symptoms of ACS. Verbalized understanding and will report to ED if these develop.  2. Controlled type 2 diabetes mellitus with complication, with long-term current use of insulin (HCC) - Stable, continue current regimen and f/u with Dr. Lorelei Pont in 3 months for diabetes management.  Jaynee Eagles, PA-C [facility] [phone] 10/07/2015 2:01 PM

## 2015-10-07 NOTE — Patient Instructions (Addendum)
Appointment has been set for patient to see PA Richardson Dopp at [facility] on [facility]. For Next Wednesday, Jan. 18th at 11:30 A.M.    Angina Pectoris Angina pectoris, often called angina, is extreme discomfort in the chest, neck, or arm. This is caused by a lack of blood in the middle and thickest layer of the heart wall (myocardium). There are four types of angina:  Stable angina. Stable angina usually occurs in episodes of predictable frequency and duration. It is usually brought on by physical activity, stress, or excitement. Stable angina usually lasts a few minutes and can often be relieved by a medicine that you place under your tongue. This medicine is called sublingual nitroglycerin.  Unstable angina. Unstable angina can occur even when you are doing little or no physical activity. It can even occur while you are sleeping or when you are at rest. It can suddenly increase in severity or frequency. It may not be relieved by sublingual nitroglycerin, and it can last up to 30 minutes.  Microvascular angina. This type of angina is caused by a disorder of tiny blood vessels called arterioles. Microvascular angina is more common in women. The pain may be more severe and last longer than other types of angina pectoris.  Prinzmetal or variant angina. This type of angina pectoris is rare and usually occurs when you are doing little or no physical activity. It especially occurs in the early morning hours. CAUSES Atherosclerosis is the cause of angina. This is the buildup of fat and cholesterol (plaque) on the inside of the arteries. Over time, the plaque may narrow or block the artery, and this will lessen blood flow to the heart. Plaque can also become weak and break off within a coronary artery to form a clot and cause a sudden blockage. RISK FACTORS Risk factors common to both men and women include:  High cholesterol levels.  High blood pressure (hypertension).  Tobacco  use.  Diabetes.  Family history of angina.  Obesity.  Lack of exercise.  A diet high in saturated fats. Women are at greater risk for angina if they are:  Over age 1.  Postmenopausal. SYMPTOMS Many people do not experience any symptoms during the early stages of angina. As the condition progresses, symptoms common to both men and women may include:  Chest pain.  The pain can be described as a crushing or squeezing in the chest, or a tightness, pressure, fullness, or heaviness in the chest.  The pain can last more than a few minutes, or it can stop and recur.  Pain in the arms, neck, jaw, or back.  Unexplained heartburn or indigestion.  Shortness of breath.  Nausea.  Sudden cold sweats.  Sudden light-headedness. Many women have chest discomfort and some of the other symptoms. However, women often have different (atypical) symptoms, such as:   Fatigue.  Unexplained feelings of nervousness or anxiety.  Unexplained weakness.  Dizziness or fainting. Sometimes, women may have angina without any symptoms. DIAGNOSIS  Tests to diagnose angina may include:  ECG (electrocardiogram).  Exercise stress test. This looks for signs of blockage when the heart is being exercised.  Pharmacologic stress test. This test looks for signs of blockage when the heart is being stressed with a medicine.  Blood tests.  Coronary angiogram. This is a procedure to look at the coronary arteries to see if there is any blockage. TREATMENT  The treatment of angina may include the following:  Healthy behavioral changes to reduce or control  risk factors.  Medicine.  Coronary stenting.A stent helps to keep an artery open.  Coronary angioplasty. This procedure widens a narrowed or blocked artery.  Coronary arterybypass surgery. This will allow your blood to pass the blockage (bypass) to reach your heart. HOME CARE INSTRUCTIONS   Take medicines only as directed by your health care  provider.  Do not take the following medicines unless your health care provider approves:  Nonsteroidal anti-inflammatory drugs (NSAIDs), such as ibuprofen, naproxen, or celecoxib.  Vitamin supplements that contain vitamin A, vitamin E, or both.  Hormone replacement therapy that contains estrogen with or without progestin.  Manage other health conditions such as hypertension and diabetes as directed by your health care provider.  Follow a heart-healthy diet. A dietitian can help to educate you about healthy food options and changes.  Use healthy cooking methods such as roasting, grilling, broiling, baking, poaching, steaming, or stir-frying. Talk to a dietitian to learn more about healthy cooking methods.  Follow an exercise program approved by your health care provider.  Maintain a healthy weight. Lose weight as approved by your health care provider.  Plan rest periods when fatigued.  Learn to manage stress.  Do not use any tobacco products, including cigarettes, chewing tobacco, or electronic cigarettes. If you need help quitting, ask your health care provider.  If you drink alcohol, and your health care provider approves, limit your alcohol intake to no more than 1 drink per day. One drink equals 12 ounces of beer, 5 ounces of wine, or 1 ounces of hard liquor.  Stop illegal drug use.  Keep all follow-up visits as directed by your health care provider. This is important. SEEK IMMEDIATE MEDICAL CARE IF:   You have pain in your chest, neck, arm, jaw, stomach, or back that lasts more than a few minutes, is recurring, or is unrelieved by taking sublingualnitroglycerin.  You have profuse sweating without cause.  You have unexplained:  Heartburn or indigestion.  Shortness of breath or difficulty breathing.  Nausea or vomiting.  Fatigue.  Feelings of nervousness or anxiety.  Weakness.  Diarrhea.  You have sudden light-headedness or dizziness.  You faint. These  symptoms may represent a serious problem that is an emergency. Do not wait to see if the symptoms will go away. Get medical help right away. Call your local emergency services (911 in the [place] Do not drive yourself to the hospital.   This information is not intended to replace advice given to you by your health care provider. Make sure you discuss any questions you have with your health care provider.   Document Released: 09/12/2005 Document Revised: 10/03/2014 Document Reviewed: 01/14/2014 Elsevier Interactive Patient Education [employer].

## 2015-10-13 NOTE — Progress Notes (Signed)
Cardiology Office Note:    Date:  10/14/2015   ID:  Barry [name], DOB Mar 13, 1951, MRN [id number]  PCP:  Lamar Blinks, MD  Cardiologist:  Dr. Loralie Champagne   Electrophysiologist:  n/a  Chief Complaint  Patient presents with  . Chest Pain    Seen at Urgent Care 10/07/15    History of Present Illness:    Barry [name] is a 65 y.o. male with a hx of CAD, chronic superior mesenteric artery dissection, prior stroke, DM2, HTN, CKD, HL. He originally presented in 02/2014 with non-STEMI. LHC demonstrated a ruptured plaque and thrombus in the proximal RCA which was treated with a DES. He was readmitted several weeks later with unstable angina and LHC demonstrated high-grade stenosis in the CFX which was treated with a DES. He was seen 09/2014 and complained of left-sided chest discomfort and dizziness. Lexiscan Myoview was low risk with inferior lateral defect consistent with thinning versus small prior infarct. There was no ischemia. EF was 49%.   LHC was arranged 9/16 for recurrent chest pain and demonstrated 2v CAD with patent mid LCx/OM2 stent and patent RCA stent, mod stenosis in a small caliber sub-branch of OM2 jailed by the old stent (unchanged from 2015), mod stenosis in a small to mod caliber ostial PDA (unchanged from 2015). Med Rx was recommended. Last seen in this clinic by me in 06/2015.  He was referred to GI for rectal bleeding.  Colo demonstrated polyps. Path was neg for malignancy.    He was seen in urgent care 10/07/15 with complaints of chest pain relieved by nitroglycerin.  He is referred back for further evaluation.  He is seen today with the help of an interpreter. The patient notes discomfort in his chest about once a week over the past month. This typically occurs at night. He notices it when he awakens. It does not wake him up. He does note some pleuritic component as well as some discomfort with lying supine. Changing positions does not seem to affect his discomfort. He does  feel somewhat dyspneic with the discomfort. It is not like his previous angina. He is able to exercise without significant symptoms. He denies syncope. He has a nonproductive cough that is overall mild. He denies any wheezing. He does note some mild dysphagia at times. He is currently on PPI as well as H2RA therapy. He denies odynophagia, melena, hematochezia, vomiting, diarrhea. He does note symptoms of constipation.    Past Medical History  Diagnosis Date  . Hypertension   . Diabetes mellitus   . Stroke ([place])   . Iliac artery aneurysm, left ([place])   . 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[place])     a. Mesenteric Artery Duplex (7/15):  pSMA chronic dissection with aneurysmal dilation of 1.29 cm ([facility]);  b.  Chest CTA (02/26/14):  IMPRESSION:  1. No aortic dissection or other acute abnormality.  2. 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  3. Stable 17 mm ectasia of the left common iliac artery.  4. Atherosclerosis, including aortoiliac and coronary artery disease.   Marland Kitchen CAD (coronary artery disease)     a. LHC (02/26/14):  mLAD 20 (faint L>R collats), mCFX 50, pRCA 95 (plaque rupture) >> PCI:  Xience DES to pRCA (severe tortuosity of R innominate artery >> needs L radial or FA in future);  b. LHC (03/10/14):  CFX 90, oOM 70, pRCA stent patent >> PCI:  Xience DES to mCFX   . Myocardial infarction ([place])   . Hx of cardiovascular  stress test     Lexiscan Myoview (2/16):  Inferior lateral defect c/w thinning vs small prior infarct, no ischemia, EF 49%, Low Risk  . Hx of echocardiogram     a.  Echocardiogram (02/27/14):  Mod focal basal and mild concentric LVH, EF 50-55%, no RWMA, Gr 1 DD, mild TR, normal RVF  . Carotid stenosis     a. Carotid US (02/27/14):  Bilateral ICA 1-39%  . Hyperlipidemia   1. HTN 2. Type II diabetes 3. CVA in 2011 4. AAA: 2.2 cm in 3/15.  5. PAD: Left CIA with penetrating ulcer. 6. Chronic superior mesenteric artery dissection, followed by Dr Trula Slade. 7. CKD 8. CAD:  NSTEMI 6/15 with 95% proximal RCA stenosis treated with DES. Unstable angina later in 6/15, this time had DES to 90% mLCx. Echo (6/15) with EF 50-55%, moderate focal basal septal hypertrophy, normal RV size and systolic function. Lexiscan Cardiolite (2/16) with EF 49%, small fixed inferolateral defect with no ischemia.  9. Carotid dopplers (6/15) with mild stenosis.  10. Hyperlipidemia: Did not tolerate atorvastatin.    Past Surgical History  Procedure Laterality Date  . Admission  09/26/2009    CVA.  Barry [name].  . Cardiac catheterization    . Coronary angioplasty    . Left heart catheterization with coronary angiogram N/A 02/26/2014    Procedure: LEFT HEART CATHETERIZATION WITH CORONARY ANGIOGRAM;  Surgeon: Wellington Hampshire, MD;  Location: [facility] CATH LAB;  Service: Cardiovascular;  Laterality: N/A;  . Left heart catheterization with coronary angiogram N/A 03/10/2014    Procedure: LEFT HEART CATHETERIZATION WITH CORONARY ANGIOGRAM;  Surgeon: Jettie Booze, MD;  Location[facility];  Service: Cardiovascular;  Laterality: N/A;  . Cardiac catheterization N/A 06/25/2015    Procedure: Left Heart Cath and Coronary Angiography;  Surgeon: Burnell Blanks, MD;  Location: [facility] CV LAB;  Service: Cardiovascular;  Laterality: N/A;  . Colonoscopy with propofol N/A 07/23/2015    Procedure: COLONOSCOPY WITH PROPOFOL;  Surgeon: [name], MD;  Location: [facility] ENDOSCOPY;  Service: Endoscopy;  Laterality: N/A;    Current Medications: Outpatient Prescriptions Prior to Visit  Medication Sig Dispense Refill  . amLODipine (NORVASC) 5 MG tablet Take 1 tablet (5 mg total) by mouth daily. 30 tablet 11  . aspirin 81 MG EC tablet Take 1 tablet (81 mg total) by mouth daily.    . Blood Glucose Monitoring Suppl (ONE TOUCH ULTRA SYSTEM KIT) W/DEVICE KIT 1 kit by Does not apply route once. One touch delica lancets, and meter Pt will test once daily. Dx 250.92 1 each 0  . carvedilol (COREG) 6.25 MG  tablet Take 1 tablet (6.25 mg total) by mouth 2 (two) times daily with a meal. 180 tablet 2  . clopidogrel (PLAVIX) 75 MG tablet Take 1 tablet (75 mg total) by mouth daily with breakfast. 90 tablet 1  . docusate sodium (COLACE) 100 MG capsule Take 1 capsule (100 mg total) by mouth daily. For constipation 90 capsule 1  . gabapentin (NEURONTIN) 300 MG capsule Take 1 capsule (300 mg total) by mouth 3 (three) times daily. And 2 tabs as needed at night for leg pain 90 capsule 4  . glucose blood (ONE TOUCH ULTRA TEST) test strip Test blood sugar 3 times a day. Dx code: E11.8 100 each 10  . Insulin Detemir (LEVEMIR FLEXTOUCH) 100 UNIT/ML Pen Inject 10 Units into the skin daily at 10 pm. 9 mL 2  . Insulin Pen Needle (NOVOTWIST) 32G X 5 MM  MISC Use 2 per day to inject lantus and victoza 90 each 3  . isosorbide mononitrate (IMDUR) 30 MG 24 hr tablet Take 1 tablet (30 mg total) by mouth daily. 30 tablet 11  . Lancets MISC Use to check blood sugar three times daily. Dx: E11.8 300 each 3  . Liraglutide 18 MG/3ML SOPN Inject 0.2 mLs (1.2 mg total) into the skin daily. 6 mL 3  . lisinopril (PRINIVIL,ZESTRIL) 20 MG tablet Take 1 tablet (20 mg total) by mouth daily. 30 tablet 11  . nitroGLYCERIN (NITROSTAT) 0.4 MG SL tablet Place 1 tablet (0.4 mg total) under the tongue every 5 (five) minutes as needed for chest pain. 25 tablet 1  . pantoprazole (PROTONIX) 40 MG tablet Take 1 tablet (40 mg total) by mouth daily. 30 tablet 3  . polyethylene glycol powder (GLYCOLAX/MIRALAX) powder Take 17 g by mouth 2 (two) times daily as needed for mild constipation. 500 g 11  . nortriptyline (PAMELOR) 25 MG capsule Take 1 capsule (25 mg total) by mouth at bedtime. 30 capsule 3  . rosuvastatin (CRESTOR) 40 MG tablet Take 1 tablet (40 mg total) by mouth daily. 90 tablet 0   No facility-administered medications prior to visit.     Allergies:   Review of patient's allergies indicates no known allergies.   Social History   Social  History  . Marital Status: Married    Spouse Name: N/A  . Number of Children: 3  . Years of Education: N/A   Occupational History  . Retired    Social History Main Topics  . Smoking status: Former Smoker    Quit date: 06/09/2009  . Smokeless tobacco: Never Used  . Alcohol Use: No  . Drug Use: No  . Sexual Activity: Not Currently   Other Topics Concern  . None   Social History Narrative   Marital: married.      Lives: with son,wife.       Children: 3 children; 6 grandchildren      Employed: unemployed; disability unknown reason/CVA L sided weakness      Tobacco:  Quit 2012; smoked 40 years.       Alcohol: no drinking now; social in past.       Drugs: none       Exercise: sporadic.       ADLs:  No driving since CVA.     Family History:  The patient's family history includes Diabetes in his father; Heart attack in his father; Hypertension in his father. There is no history of Stroke.   ROS:   Please see the history of present illness.    ROS All other systems reviewed and are negative.   Physical Exam:    VS:  BP 100/60 mmHg  Pulse 80  Ht _0  (1.676 m)  Wt 172 lb 12.8 oz (78.382 kg)  BMI 27.90 kg/m2   GEN: Well nourished, well developed, in no acute distress HEENT: normal Neck: no JVD, no masses Cardiac: Normal S1/S2, RRR; no murmurs, rubs, or gallops; no edema;    Chest: No tenderness to palpation Respiratory:  clear to auscultation bilaterally; no wheezing, rhonchi or rales GI: soft, nontender, nondistended MS: no deformity or atrophy Skin: warm and dry, no rash Neuro:  no focal deficits  Psych: Alert and oriented x 3, normal affect  Wt Readings from Last 3 Encounters:  10/14/15 172 lb 12.8 oz (78.382 kg)  10/07/15 170 lb 3.2 oz (77.202 kg)  09/28/15 168 lb 9.6 oz (76.476  kg)      Studies/Labs Reviewed:    EKG:  EKG is ordered today.  The ekg ordered today demonstrates NSR, HR 81, inferior Q waves, nonspecific ST-T wave changes, QTc 464 ms, no  significant change when compared to prior tracings  Recent Labs: 07/01/2015: ALT 19 07/13/2015: Platelets 220 09/28/2015: BUN 14; Creat 1.24; Potassium 4.2; Sodium 139 10/07/2015: Hemoglobin 14.8   Recent Lipid Panel    Component Value Date/Time   CHOL 157 04/20/2015 0951   TRIG 207.0* 04/20/2015 0951   HDL 30.30* 04/20/2015 0951   CHOLHDL 5 04/20/2015 0951   VLDL 41.4* 04/20/2015 0951   LDLCALC 143* 12/18/2014 0834   LDLDIRECT 101.0 04/20/2015 0951    Additional studies/ records that were reviewed today include:   LHC 06/25/15 LAD: Ostial 30%, prox 30%, dist 30%, D1 30% LCx: prox 20%, OM2 stent ok with 10% ISR, small lateral OM2 80% RCA: Ostial stent ok with 15%, mid 20%, dist 20%, ostial RPDA 60% EF 55-65% 1. Double vessel CAD with patent stent mid Circumflex/OM2 and patent stent RCA 2. Moderate stenosis in the small caliber sub-branch of OM2 where it is jailed by the old stent. This is a small caliber branch vessel. Unchanged in appearance from last cath in 2015.  3. Moderate stenosis in the small to moderate caliber ostial PDA, unchanged from last cath in 2015.  4. Normal LV function Recommendations: Continue medical therapy for CAD.   Lexiscan Myoview 11/18/14 Inferior lateral defect consistent with thinning vs small prior infarct; no ischemia. EF 49%    ASSESSMENT:    1. Other chest pain   2. Coronary artery disease involving native coronary artery of native heart without angina pectoris   3. PAD (peripheral artery disease) ([place])   4. Essential hypertension   5. HLD (hyperlipidemia)   6. Insomnia     PLAN:    In order of problems listed above:  1.Chest pain - The patient has noted some discomfort in his chest at night. He has atypical >> typical features. He does have some relief with nitroglycerin.  LHC in 9/16 did demonstrate mod stenosis in a small caliber sub-branch of OM2 jailed by the old stent (unchanged from 2015), mod stenosis in a small to mod  caliber ostial PDA (unchanged from 2015).  Question if this may be causing some angina. He also has a pleuritic component and has noted associated dyspnea. I do not hear a rub on exam. In addition, he has symptoms that sound as though they could be consistent with a GI etiology.  ECG is unchanged.  -  Trial of Ranexa 500 mg twice a day.  If no change in symptoms, will not continue long term.  -  Arrange 2-D echocardiogram  -  Continue PPI and H2RA.  May need to consider referral back to GI.  2. CAD: S/p NSTEMI in 2015 treated with DES to the RCA and subsequent PCI to the LCx 2/2 [place].  Myoview in 2/16 was neg for ischemia. EF low normal on echo and mildly decreased on recent Myoview. He was hospitalized in 9/16 with worsening chest pain. LHC demonstrated patent stents to the RCA and LCx and otherwise stable anatomy compared to the films in 2015. Medical Rx was recommended. He has had recent chest symptoms. Language barrier does affect the ability to completely assess his symptoms. As noted, I will place him on a trial of Ranexa 500 mg twice a day and obtain an echocardiogram.  Continue ASA 81, Plavix  75, carvedilol, ACEI, statin, nitrates.   3. PAD: Penetrating ulcer left CIA and chronic SMA dissection. Last CTA abdomen did not show any changes in the appearance of the SMA. He is followed by [facility].  Continue ASA, Plavix, statin.   4. HTN - Controlled.  5. Hyperlipidemia: LDL in 7/16 was 101.  Continue high intensity statin therapy with Crestor 40 mg QD.    6. Insomnia - DC nortriptyline for now due to potential interaction with Ranexa (QT prolongation). He may try Benadryl as needed for sleep.     Medication Adjustments/Labs and Tests Ordered: Current medicines are reviewed at length with the patient today.  Concerns regarding medicines are outlined above.  Medication changes, Labs and Tests ordered today are outlined in the Patient Instructions noted below. Patient Instructions    Medication Instructions:  START RANEXA 500 MG 1 TABLET TWICE DAILY  STOP NORTRIPTYLINE (PAMELOR)  PER Teondre Jarosz, PAC IF YOU NEED TO SOMETHING TO HELP YOU SLEEP THEN YOU COULD TRY BENADRYL  Labwork: NONE  Testing/Procedures: Your physician has requested that you have an echocardiogram. Echocardiography is a painless test that uses sound waves to create images of your heart. It provides your doctor with information about the size and shape of your heart and how well your heart's chambers and valves are working. This procedure takes approximately one hour. There are no restrictions for this procedure.   Follow-Up: Ameri C[place], PAC 2-3 WEEKS   Any Other Special Instructions Will Be Listed Below (If Applicable).     If you need a refill on your cardiac medications before your next appointment, please call your pharmacy.       Signed, Richardson Dopp, PA-C  10/14/2015 12:08 PM    [facility] [place], [place], [place]  [address] Phone: [phone]; Fax: [phone]

## 2015-10-14 ENCOUNTER — Ambulatory Visit (INDEPENDENT_AMBULATORY_CARE_PROVIDER_SITE_OTHER): Payer: Medicare Other | Admitting: Physician Assistant

## 2015-10-14 ENCOUNTER — Encounter: Payer: Self-pay | Admitting: Physician Assistant

## 2015-10-14 VITALS — BP 100/60 | HR 80 | Ht 66.0 in | Wt 172.8 lb

## 2015-10-14 DIAGNOSIS — I10 Essential (primary) hypertension: Secondary | ICD-10-CM | POA: Diagnosis not present

## 2015-10-14 DIAGNOSIS — E785 Hyperlipidemia, unspecified: Secondary | ICD-10-CM | POA: Diagnosis not present

## 2015-10-14 DIAGNOSIS — G4700 Insomnia, unspecified: Secondary | ICD-10-CM

## 2015-10-14 DIAGNOSIS — I2510 Atherosclerotic heart disease of native coronary artery without angina pectoris: Secondary | ICD-10-CM | POA: Diagnosis not present

## 2015-10-14 DIAGNOSIS — I714 Abdominal aortic aneurysm, without rupture: Secondary | ICD-10-CM

## 2015-10-14 DIAGNOSIS — R0789 Other chest pain: Secondary | ICD-10-CM | POA: Diagnosis not present

## 2015-10-14 DIAGNOSIS — I739 Peripheral vascular disease, unspecified: Secondary | ICD-10-CM | POA: Diagnosis not present

## 2015-10-14 MED ORDER — RANOLAZINE ER 500 MG PO TB12: 500.0000 mg | ORAL_TABLET | Freq: Two times a day (BID) | ORAL | Status: DC

## 2015-10-14 MED ORDER — ROSUVASTATIN CALCIUM 40 MG PO TABS: 40.0000 mg | ORAL_TABLET | Freq: Every day | ORAL | Status: DC

## 2015-10-14 NOTE — Patient Instructions (Signed)
Medication Instructions:  START RANEXA 500 MG 1 TABLET TWICE DAILY  STOP NORTRIPTYLINE (PAMELOR)  PER SCOTT WEAVER, PAC IF YOU NEED TO SOMETHING TO HELP YOU SLEEP THEN YOU COULD TRY BENADRYL  Labwork: NONE  Testing/Procedures: Your physician has requested that you have an echocardiogram. Echocardiography is a painless test that uses sound waves to create images of your heart. It provides your doctor with information about the size and shape of your heart and how well your heart's chambers and valves are working. This procedure takes approximately one hour. There are no restrictions for this procedure.   Follow-Up: SCOTT WEAVER, PAC 2-3 WEEKS   Any Other Special Instructions Will Be Listed Below (If Applicable).     If you need a refill on your cardiac medications before your next appointment, please call your pharmacy.

## 2015-10-16 ENCOUNTER — Encounter: Payer: Self-pay | Admitting: Family Medicine

## 2015-10-21 ENCOUNTER — Encounter: Payer: Self-pay | Admitting: Family Medicine

## 2015-10-22 ENCOUNTER — Other Ambulatory Visit: Payer: Self-pay | Admitting: Family Medicine

## 2015-10-27 ENCOUNTER — Encounter: Payer: Self-pay | Admitting: Physician Assistant

## 2015-10-27 ENCOUNTER — Ambulatory Visit (HOSPITAL_COMMUNITY): Payer: Medicare Other | Attending: Cardiology

## 2015-10-27 ENCOUNTER — Other Ambulatory Visit: Payer: Self-pay

## 2015-10-27 DIAGNOSIS — E119 Type 2 diabetes mellitus without complications: Secondary | ICD-10-CM | POA: Insufficient documentation

## 2015-10-27 DIAGNOSIS — I517 Cardiomegaly: Secondary | ICD-10-CM | POA: Diagnosis not present

## 2015-10-27 DIAGNOSIS — I10 Essential (primary) hypertension: Secondary | ICD-10-CM | POA: Insufficient documentation

## 2015-10-27 DIAGNOSIS — R0789 Other chest pain: Secondary | ICD-10-CM | POA: Insufficient documentation

## 2015-10-27 DIAGNOSIS — E785 Hyperlipidemia, unspecified: Secondary | ICD-10-CM | POA: Diagnosis not present

## 2015-10-27 DIAGNOSIS — R079 Chest pain, unspecified: Secondary | ICD-10-CM | POA: Diagnosis present

## 2015-10-27 DIAGNOSIS — I351 Nonrheumatic aortic (valve) insufficiency: Secondary | ICD-10-CM | POA: Diagnosis not present

## 2015-10-28 ENCOUNTER — Telehealth: Payer: Self-pay | Admitting: *Deleted

## 2015-10-28 NOTE — Telephone Encounter (Signed)
Daughter Esa aware of echo results due to language barrier. Confirmed appt 2/10.

## 2015-11-05 NOTE — Progress Notes (Signed)
Cardiology Office Note:    Date:  11/05/2015   ID:  [name], DOB 1951/02/26, MRN [id number]  PCP:  Barry Blinks, MD  Cardiologist:  Dr. Loralie [name]   Electrophysiologist:  n/a  Chief Complaint  Patient presents with  . Coronary Artery Disease    Follow up    History of Present Illness:    Barry [name] is a 65 y.o. male with a hx of CAD, chronic superior mesenteric artery dissection, prior stroke, DM2, HTN, CKD, HL. He originally presented in 02/2014 with non-STEMI. LHC demonstrated a ruptured plaque and thrombus in the proximal RCA which was treated with a DES. He was readmitted several weeks later with unstable angina and LHC demonstrated high-grade stenosis in the CFX which was treated with a DES. He was seen 09/2014 and complained of left-sided chest discomfort and dizziness. Lexiscan Myoview was low risk with inferior lateral defect consistent with thinning versus small prior infarct. There was no ischemia. EF was 49%.   LHC was arranged 9/16 for recurrent chest pain and demonstrated 2v CAD with patent mid LCx/OM2 stent and patent RCA stent, mod stenosis in a small caliber sub-branch of OM2 jailed by the old stent (unchanged from 2015), mod stenosis in a small to mod caliber ostial PDA (unchanged from 2015). Med Rx was recommended. Last seen in this clinic by me in 06/2015.  He was referred to GI for rectal bleeding.  Colo demonstrated polyps. Path was neg for malignancy.    He was seen in urgent care 10/07/15 with complaints of chest pain relieved by nitroglycerin.  He is referred back for further evaluation.  He is seen today with the help of an interpreter. The patient notes discomfort in his chest about once a week over the past month. This typically occurs at night. He notices it when he awakens. It does not wake him up. He does note some pleuritic component as well as some discomfort with lying supine. Changing positions does not seem to affect his discomfort. He does feel  somewhat dyspneic with the discomfort. It is not like his previous angina. He is able to exercise without significant symptoms. He denies syncope. He has a nonproductive cough that is overall mild. He denies any wheezing. He does note some mild dysphagia at times. He is currently on PPI as well as H2RA therapy. He denies odynophagia, melena, hematochezia, vomiting, diarrhea. He does note symptoms of constipation.    Past Medical History  Diagnosis Date  . Hypertension   . Diabetes mellitus   . Stroke ([place])   . Iliac artery aneurysm, left ([place])   . 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[place])     a. Mesenteric Artery Duplex (7/15):  pSMA chronic dissection with aneurysmal dilation of 1.29 cm ([facility]);  b.  Chest CTA (02/26/14):  IMPRESSION:  1. No aortic dissection or other acute abnormality.  2. 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  3. Stable 17 mm ectasia of the left common iliac artery.  4. Atherosclerosis, including aortoiliac and coronary artery disease.   Marland Kitchen CAD (coronary artery disease)     a. LHC (02/26/14):  mLAD 20 (faint L>R collats), mCFX 50, pRCA 95 (plaque rupture) >> PCI:  Xience DES to pRCA (severe tortuosity of R innominate artery >> needs L radial or FA in future);  b. LHC (03/10/14):  CFX 90, oOM 70, pRCA stent patent >> PCI:  Xience DES to mCFX   . Myocardial infarction ([place])   . Hx of cardiovascular stress test  Lexiscan Myoview (2/16):  Inferior lateral defect c/w thinning vs small prior infarct, no ischemia, EF 49%, Low Risk  . Hx of echocardiogram     a.  Echocardiogram (02/27/14):  Mod focal basal and mild concentric LVH, EF 50-55%, no RWMA, Gr 1 DD, mild TR, normal RVF  . Carotid stenosis     a. Carotid US (02/27/14):  Bilateral ICA 1-39%  . Hyperlipidemia   . History of echocardiogram 09/2015    Echo 1/17: mod LVH, EF 55%, inf-lat HK, Gr 1 DD, mild LAE  1. HTN 2. Type II diabetes 3. CVA in 2011 4. AAA: 2.2 cm in 3/15.  5. PAD: Left CIA with penetrating ulcer. 6.  Chronic superior mesenteric artery dissection, followed by Dr Barry [name]. 7. CKD 8. CAD: NSTEMI 6/15 with 95% proximal RCA stenosis treated with DES. Unstable angina later in 6/15, this time had DES to 90% mLCx. Echo (6/15) with EF 50-55%, moderate focal basal septal hypertrophy, normal RV size and systolic function. Lexiscan Cardiolite (2/16) with EF 49%, small fixed inferolateral defect with no ischemia.  9. Carotid dopplers (6/15) with mild stenosis.  10. Hyperlipidemia: Did not tolerate atorvastatin.    Past Surgical History  Procedure Laterality Date  . Admission  09/26/2009    CVA.  Barry [name].  . Cardiac catheterization    . Coronary angioplasty    . Left heart catheterization with coronary angiogram N/A 02/26/2014    Procedure: LEFT HEART CATHETERIZATION WITH CORONARY ANGIOGRAM;  Surgeon: Barry Hampshire, MD;  Location: [facility] CATH LAB;  Service: Cardiovascular;  Laterality: N/A;  . Left heart catheterization with coronary angiogram N/A 03/10/2014    Procedure: LEFT HEART CATHETERIZATION WITH CORONARY ANGIOGRAM;  Surgeon: Barry Booze, MD;  Location: [facility] CATH LAB;  Service: Cardiovascular;  Laterality: N/A;  . Cardiac catheterization N/A 06/25/2015    Procedure: Left Heart Cath and Coronary Angiography;  Surgeon: Barry Blanks, MD;  Location: [facility] CV LAB;  Service: Cardiovascular;  Laterality: N/A;  . Colonoscopy with propofol N/A 07/23/2015    Procedure: COLONOSCOPY WITH PROPOFOL;  Surgeon: Barry Banister, MD;  Location: [facility] ENDOSCOPY;  Service: Endoscopy;  Laterality: N/A;    Current Medications: Outpatient Prescriptions Prior to Visit  Medication Sig Dispense Refill  . amLODipine (NORVASC) 5 MG tablet Take 1 tablet (5 mg total) by mouth daily. 30 tablet 11  . aspirin 81 MG EC tablet Take 1 tablet (81 mg total) by mouth daily.    . Blood Glucose Monitoring Suppl (ONE TOUCH ULTRA SYSTEM KIT) W/DEVICE KIT 1 kit by Does not apply route once. One touch delica  lancets, and meter Pt will test once daily. Dx 250.92 1 each 0  . carvedilol (COREG) 6.25 MG tablet Take 1 tablet (6.25 mg total) by mouth 2 (two) times daily with a meal. 180 tablet 2  . clopidogrel (PLAVIX) 75 MG tablet Take 1 tablet (75 mg total) by mouth daily with breakfast. 90 tablet 1  . docusate sodium (COLACE) 100 MG capsule Take 1 capsule (100 mg total) by mouth daily. For constipation 90 capsule 1  . gabapentin (NEURONTIN) 300 MG capsule TAKE ONE CAPSULE BY MOUTH THREE TIMES DAILY AND  2  TABS  AS  NEEDED  AT  NIGHT  FOR  LEG  PAIN 90 capsule 0  . glucose blood (ONE TOUCH ULTRA TEST) test strip Test blood sugar 3 times a day. Dx code: E11.8 100 each 10  . Insulin Detemir (LEVEMIR FLEXTOUCH) 100 UNIT/ML Pen Inject 10  Units into the skin daily at 10 pm. 9 mL 2  . Insulin Pen Needle (NOVOTWIST) 32G X 5 MM MISC Use 2 per day to inject lantus and victoza 90 each 3  . isosorbide mononitrate (IMDUR) 30 MG 24 hr tablet Take 1 tablet (30 mg total) by mouth daily. 30 tablet 11  . Lancets MISC Use to check blood sugar three times daily. Dx: E11.8 300 each 3  . Liraglutide 18 MG/3ML SOPN Inject 0.2 mLs (1.2 mg total) into the skin daily. 6 mL 3  . lisinopril (PRINIVIL,ZESTRIL) 20 MG tablet Take 1 tablet (20 mg total) by mouth daily. 30 tablet 11  . nitroGLYCERIN (NITROSTAT) 0.4 MG SL tablet Place 1 tablet (0.4 mg total) under the tongue every 5 (five) minutes as needed for chest pain. 25 tablet 1  . pantoprazole (PROTONIX) 40 MG tablet Take 1 tablet (40 mg total) by mouth daily. 30 tablet 3  . polyethylene glycol powder (GLYCOLAX/MIRALAX) powder Take 17 g by mouth 2 (two) times daily as needed for mild constipation. 500 g 11  . ranitidine (ZANTAC) 150 MG tablet Take 150 mg by mouth 2 (two) times daily.    . ranolazine (RANEXA) 500 MG 12 hr tablet Take 1 tablet (500 mg total) by mouth 2 (two) times daily. 60 tablet 11  . rosuvastatin (CRESTOR) 40 MG tablet Take 1 tablet (40 mg total) by mouth daily.  90 tablet 3   No facility-administered medications prior to visit.     Allergies:   Review of patient's allergies indicates no known allergies.   Social History   Social History  . Marital Status: Married    Spouse Name: N/A  . Number of Children: 3  . Years of Education: N/A   Occupational History  . Retired    Social History Main Topics  . Smoking status: Former Smoker    Quit date: 06/09/2009  . Smokeless tobacco: Never Used  . Alcohol Use: No  . Drug Use: No  . Sexual Activity: Not Currently   Other Topics Concern  . Not on file   Social History Narrative   Marital: married.      Lives: with son,wife.       Children: 3 children; 6 grandchildren      Employed: unemployed; disability unknown reason/CVA L sided weakness      Tobacco:  Quit 2012; smoked 40 years.       Alcohol: no drinking now; social in past.       Drugs: none       Exercise: sporadic.       ADLs:  No driving since CVA.     Family History:  The patient's family history includes Diabetes in his father; Heart attack in his father; Hypertension in his father. There is no history of Stroke.   ROS:   Please see the history of present illness.    ROS All other systems reviewed and are negative.   Physical Exam:    VS:  There were no vitals taken for this visit.   GEN: Well nourished, well developed, in no acute distress HEENT: normal Neck: no JVD, no masses Cardiac: Normal S1/S2, RRR; no murmurs, rubs, or gallops; no edema;    Chest: No tenderness to palpation Respiratory:  clear to auscultation bilaterally; no wheezing, rhonchi or rales GI: soft, nontender, nondistended MS: no deformity or atrophy Skin: warm and dry, no rash Neuro:  no focal deficits  Psych: Alert and oriented x 3, normal affect  Wt Readings from Last 3 Encounters:  10/14/15 172 lb 12.8 oz (78.382 kg)  10/07/15 170 lb 3.2 oz (77.202 kg)  09/28/15 168 lb 9.6 oz (76.476 kg)      Studies/Labs Reviewed:    EKG:  EKG is   ordered today.  The ekg ordered today demonstrates   Recent Labs: 07/01/2015: ALT 19 07/13/2015: Platelets 220 09/28/2015: BUN 14; Creat 1.24; Potassium 4.2; Sodium 139 10/07/2015: Hemoglobin 14.8   Recent Lipid Panel    Component Value Date/Time   CHOL 157 04/20/2015 0951   TRIG 207.0* 04/20/2015 0951   HDL 30.30* 04/20/2015 0951   CHOLHDL 5 04/20/2015 0951   VLDL 41.4* 04/20/2015 0951   LDLCALC 143* 12/18/2014 0834   LDLDIRECT 101.0 04/20/2015 0951    Additional studies/ records that were reviewed today include:   Echo 10/14/15 - Left ventricle: The cavity size was normal. Wall thickness was increased in a pattern of moderate LVH. The estimated ejection fraction was 55%. Inferolateral hypokinesis. Doppler parameters are consistent with abnormal left ventricular relaxation (grade 1 diastolic dysfunction). - Aortic valve: There was no stenosis. There was trivial regurgitation. - Mitral valve: There was no significant regurgitation. - Left atrium: The atrium was mildly dilated. - Right ventricle: The cavity size was normal. Systolic function was normal. - Pulmonary arteries: No complete TR doppler jet Wonders unable to estimate PA systolic pressure. - Systemic veins: IVC poorly visualized.  Impressions:  - Normal LV size with moderate LV hypertrophy. EF 55%. Inferolateral hypokinesis. Normal RV size and systolic function. No significant valvular abnormalities.  LHC 06/25/15 LAD: Ostial 30%, prox 30%, dist 30%, D1 30% LCx: prox 20%, OM2 stent ok with 10% ISR, small lateral OM2 80% RCA: Ostial stent ok with 15%, mid 20%, dist 20%, ostial RPDA 60% EF 55-65% 1. Double vessel CAD with patent stent mid Circumflex/OM2 and patent stent RCA 2. Moderate stenosis in the small caliber sub-branch of OM2 where it is jailed by the old stent. This is a small caliber branch vessel. Unchanged in appearance from last cath in 2015.  3. Moderate stenosis in the small to  moderate caliber ostial PDA, unchanged from last cath in 2015.  4. Normal LV function Recommendations: Continue medical therapy for CAD.   Lexiscan Myoview 11/18/14 Inferior lateral defect consistent with thinning vs small prior infarct; no ischemia. EF 49%    ASSESSMENT:    No diagnosis found.  PLAN:    In order of problems listed above:  1.Chest pain - The patient has noted some discomfort in his chest at night. He has atypical >> typical features. He does have some relief with nitroglycerin.  LHC in 9/16 did demonstrate mod stenosis in a small caliber sub-branch of OM2 jailed by the old stent (unchanged from 2015), mod stenosis in a small to mod caliber ostial PDA (unchanged from 2015).  Question if this may be causing some angina. He also has a pleuritic component and has noted associated dyspnea. I do not hear a rub on exam. In addition, he has symptoms that sound as though they could be consistent with a GI etiology.  ECG is unchanged.  -  Trial of Ranexa 500 mg twice a day.  If no change in symptoms, will not continue long term.  -  Arrange 2-D echocardiogram  -  Continue PPI and H2RA.  May need to consider referral back to GI.  2. CAD: S/p NSTEMI in 2015 treated with DES to the RCA and subsequent PCI to the LCx  2/2 [place].  Myoview in 2/16 was neg for ischemia. EF low normal on echo and mildly decreased on recent Myoview. He was hospitalized in 9/16 with worsening chest pain. LHC demonstrated patent stents to the RCA and LCx and otherwise stable anatomy compared to the films in 2015. Medical Rx was recommended. He has had recent chest symptoms. Language barrier does affect the ability to completely assess his symptoms. As noted, I will place him on a trial of Ranexa 500 mg twice a day and obtain an echocardiogram.  Continue ASA 81, Plavix 75, carvedilol, ACEI, statin, nitrates.   3. PAD: Penetrating ulcer left CIA and chronic SMA dissection. Last CTA abdomen did not show any  changes in the appearance of the SMA. He is followed by [facility].  Continue ASA, Plavix, statin.   4. HTN - Controlled.  5. Hyperlipidemia: LDL in 7/16 was 101.  Continue high intensity statin therapy with Crestor 40 mg QD.    6. Insomnia - DC nortriptyline for now due to potential interaction with Ranexa (QT prolongation). He may try Benadryl as needed for sleep.     Medication Adjustments/Labs and Tests Ordered: Current medicines are reviewed at length with the patient today.  Concerns regarding medicines are outlined above.  Medication changes, Labs and Tests ordered today are outlined in the Patient Instructions noted below. There are no Patient Instructions on file for this visit.   Signed, Richardson Dopp, PA-C  11/05/2015 10:14 PM    [facility] [place], [place], [place]  [address] Phone: [phone]; Fax: [phone]     This encounter was created in error - please disregard.

## 2015-11-06 ENCOUNTER — Encounter: Payer: Medicare Other | Admitting: Physician Assistant

## 2015-11-08 ENCOUNTER — Other Ambulatory Visit: Payer: Self-pay | Admitting: Family Medicine

## 2015-11-13 ENCOUNTER — Encounter: Payer: Self-pay | Admitting: Surgery

## 2015-11-16 ENCOUNTER — Ambulatory Visit
Admission: RE | Admit: 2015-11-16 | Discharge: 2015-11-16 | Disposition: A | Discharge status: Home / Self Care | Payer: Medicare Other | Source: Ambulatory Visit | Attending: Surgery | Admitting: Surgery

## 2015-11-16 DIAGNOSIS — I7779 Dissection of other artery: Secondary | ICD-10-CM

## 2015-11-16 DIAGNOSIS — I723 Aneurysm of iliac artery: Secondary | ICD-10-CM | POA: Diagnosis not present

## 2015-11-16 MED ORDER — IOPAMIDOL (ISOVUE-370) INJECTION 76%
100.0000 mL | Freq: Once | INTRAVENOUS | Status: AC | PRN
  Administered 2015-11-16: 100 mL via INTRAVENOUS

## 2015-11-23 ENCOUNTER — Encounter: Payer: Self-pay | Admitting: Surgery

## 2015-11-23 ENCOUNTER — Ambulatory Visit (INDEPENDENT_AMBULATORY_CARE_PROVIDER_SITE_OTHER): Payer: Medicare Other | Admitting: Surgery

## 2015-11-23 VITALS — BP 151/96 | HR 82 | Temp 98.4°F | Resp 18 | Ht 66.0 in | Wt 166.0 lb

## 2015-11-23 DIAGNOSIS — I7779 Dissection of other artery: Secondary | ICD-10-CM | POA: Diagnosis not present

## 2015-11-23 DIAGNOSIS — I2510 Atherosclerotic heart disease of native coronary artery without angina pectoris: Secondary | ICD-10-CM | POA: Diagnosis not present

## 2015-11-23 NOTE — Progress Notes (Signed)
Filed Vitals:   11/23/15 1145 11/23/15 1148 11/23/15 1153 11/23/15 1154  BP: 155/93 145/98 147/100 151/96  Pulse: 80 83 79 82  Temp:  98.4 F (36.9 C)    TempSrc:  Oral    Resp:  18    Height:  5\' 6"  (1.676 m)    Weight:  166 lb (75.297 kg)    SpO2:  98%

## 2015-11-23 NOTE — Addendum Note (Signed)
Addended by: Dorthula Rue L on: 11/23/2015 12:23 PM   Modules accepted: Orders

## 2015-11-23 NOTE — Progress Notes (Signed)
Patient name: Barry [name] MRN: [id number] DOB: [birth year] Sex: male     Chief Complaint  Patient presents with  . Re-evaluation    6 mo  AAA  and Disection of Mesenteric Artery  f/u     HISTORY OF PRESENT ILLNESS: The patient is here today for followup of his abdominal pain which required hospitalization at the end of June 2015. The patient is well known to me, having had an episode of abdominal pain approximately one year ago. 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 During his hospitalization, he had coronary stents placed for a NSTEMI. There is a significant language barrier for the patient. His pain began back in 1984 when he moved to the [place].   he reports no chest pain.  He no longer has bloody stools.  He has been recently struggling with sinus problems.   His weight has been stable.  He does not have any pain with eating.  He occasionally gets cramps in his toes.  Past Medical History  Diagnosis Date  . Hypertension   . Diabetes mellitus   . Stroke ([place])   . Iliac artery aneurysm, left ([place])   . 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[place])     a. Mesenteric Artery Duplex (7/15):  pSMA chronic dissection with aneurysmal dilation of 1.29 cm ([facility]);  b.  Chest CTA (02/26/14):  IMPRESSION:  1. No aortic dissection or other acute abnormality.  2. 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  3. Stable 17 mm ectasia of the left common iliac artery.  4. Atherosclerosis, including aortoiliac and coronary artery disease.   Marland Kitchen CAD (coronary artery disease)     a. LHC (02/26/14):  mLAD 20 (faint L>R collats), mCFX 50, pRCA 95 (plaque rupture) >> PCI:  Xience DES to pRCA (severe tortuosity of R innominate artery >> needs L radial or FA in future);  b. LHC (03/10/14):  CFX 90, oOM 70, pRCA stent patent >> PCI:  Xience DES to mCFX   . Myocardial infarction ([place])   . Hx of cardiovascular stress test    Lexiscan Myoview (2/16):  Inferior lateral defect c/w thinning vs small prior infarct, no ischemia, EF 49%, Low Risk  . Hx of echocardiogram     a.  Echocardiogram (02/27/14):  Mod focal basal and mild concentric LVH, EF 50-55%, no RWMA, Gr 1 DD, mild TR, normal RVF  . Carotid stenosis     a. Carotid US (02/27/14):  Bilateral ICA 1-39%  . Hyperlipidemia   . History of echocardiogram 09/2015    Echo 1/17: mod LVH, EF 55%, inf-lat HK, Gr 1 DD, mild LAE    Past Surgical History  Procedure Laterality Date  . Admission  09/26/2009    CVA.  [name].  . Cardiac catheterization    . Coronary angioplasty    . Left heart catheterization with coronary angiogram N/A 02/26/2014    Procedure: LEFT HEART CATHETERIZATION WITH CORONARY ANGIOGRAM;  Surgeon: Wellington Hampshire, MD;  Location: [place] CATH LAB;  Service: Cardiovascular;  Laterality: N/A;  . Left heart catheterization with coronary angiogram N/A 03/10/2014    Procedure: LEFT HEART CATHETERIZATION WITH CORONARY ANGIOGRAM;  Surgeon: Jettie Booze, MD;  Location: [facility] CATH LAB;  Service: Cardiovascular;  Laterality: N/A;  . Cardiac catheterization N/A 06/25/2015    Procedure: Left Heart Cath and Coronary Angiography;  Surgeon: Burnell Blanks, MD;  Location: [facility]  CV LAB;  Service: Cardiovascular;  Laterality: N/A;  . Colonoscopy with propofol N/A 07/23/2015    Procedure: COLONOSCOPY WITH PROPOFOL;  Surgeon: [name], MD;  Location: [facility] ENDOSCOPY;  Service: Endoscopy;  Laterality: N/A;    Social History   Social History  . Marital Status: Married    Spouse Name: N/A  . Number of Children: 3  . Years of Education: N/A   Occupational History  . Retired    Social History Main Topics  . Smoking status: Former Smoker    Quit date: 06/09/2009  . Smokeless tobacco: Never Used  . Alcohol Use: No  . Drug Use: No  . Sexual Activity: Not Currently   Other Topics Concern  . Not on file   Social History Narrative   Marital:  married.      Lives: with son,wife.       Children: 3 children; 6 grandchildren      Employed: unemployed; disability unknown reason/CVA L sided weakness      Tobacco:  Quit 2012; smoked 40 years.       Alcohol: no drinking now; social in past.       Drugs: none       Exercise: sporadic.       ADLs:  No driving since CVA.    Family History  Problem Relation Age of Onset  . Diabetes Father   . Hypertension Father   . Heart attack Father   . Stroke Neg Hx     Allergies as of 11/23/2015  . (No Known Allergies)    Current Outpatient Prescriptions on File Prior to Visit  Medication Sig Dispense Refill  . amLODipine (NORVASC) 5 MG tablet Take 1 tablet (5 mg total) by mouth daily. 30 tablet 11  . aspirin 81 MG EC tablet Take 1 tablet (81 mg total) by mouth daily.    . Blood Glucose Monitoring Suppl (ONE TOUCH ULTRA SYSTEM KIT) W/DEVICE KIT 1 kit by Does not apply route once. One touch delica lancets, and meter Pt will test once daily. Dx 250.92 1 each 0  . carvedilol (COREG) 6.25 MG tablet Take 1 tablet (6.25 mg total) by mouth 2 (two) times daily with a meal. 180 tablet 2  . clopidogrel (PLAVIX) 75 MG tablet Take 1 tablet (75 mg total) by mouth daily with breakfast. 90 tablet 1  . docusate sodium (COLACE) 100 MG capsule Take 1 capsule (100 mg total) by mouth daily. For constipation 90 capsule 1  . gabapentin (NEURONTIN) 300 MG capsule TAKE ONE CAPSULE BY MOUTH THREE TIMES DAILY AND  2  TABS  AS  NEEDED  AT  NIGHT  FOR  LEG  PAIN 90 capsule 0  . glucose blood (ONE TOUCH ULTRA TEST) test strip Test blood sugar 3 times a day. Dx code: E11.8 100 each 10  . Insulin Detemir (LEVEMIR FLEXTOUCH) 100 UNIT/ML Pen Inject 10 Units into the skin daily at 10 pm. 9 mL 2  . Insulin Pen Needle (NOVOTWIST) 32G X 5 MM MISC Use 2 per day to inject lantus and victoza 90 each 3  . isosorbide mononitrate (IMDUR) 30 MG 24 hr tablet Take 1 tablet (30 mg total) by mouth daily. 30 tablet 11  . Lancets MISC Use to  check blood sugar three times daily. Dx: E11.8 300 each 3  . lisinopril (PRINIVIL,ZESTRIL) 20 MG tablet Take 1 tablet (20 mg total) by mouth daily. 30 tablet 11  . nitroGLYCERIN (NITROSTAT) 0.4 MG SL tablet Place 1  tablet (0.4 mg total) under the tongue every 5 (five) minutes as needed for chest pain. 25 tablet 1  . pantoprazole (PROTONIX) 40 MG tablet Take 1 tablet (40 mg total) by mouth daily. 30 tablet 3  . polyethylene glycol powder (GLYCOLAX/MIRALAX) powder Take 17 g by mouth 2 (two) times daily as needed for mild constipation. 500 g 11  . ranitidine (ZANTAC) 150 MG tablet Take 150 mg by mouth 2 (two) times daily.    . ranolazine (RANEXA) 500 MG 12 hr tablet Take 1 tablet (500 mg total) by mouth 2 (two) times daily. 60 tablet 11  . rosuvastatin (CRESTOR) 40 MG tablet Take 1 tablet (40 mg total) by mouth daily. 90 tablet 3  . VICTOZA 18 MG/3ML SOPN INJECT 1.2 MG SUBCUTANEOUSLY ONCE DAILY 6 mL 0   No current facility-administered medications on file prior to visit.     REVIEW OF SYSTEMS:  please see history of present illness for pertinent positives and negatives , otherwise all systems are negative   PHYSICAL EXAMINATION:   Vital signs are  Filed Vitals:   11/23/15 1145 11/23/15 1148 11/23/15 1153 11/23/15 1154  BP: 155/93 145/98 147/100 151/96  Pulse: 80 83 79 82  Temp:  98.4 F (36.9 C)    TempSrc:  Oral    Resp:  18    Height:  5' 6"  (1.676 m)    Weight:  166 lb (75.297 kg)    SpO2:  98%     Body mass index is 26.81 kg/(m^2). General: The patient appears their stated age. HEENT:  No gross abnormalities Pulmonary:  Non labored breathing Abdomen: Soft and non-tender Musculoskeletal: There are no major deformities. Neurologic: No focal weakness or paresthesias are detected, Skin: There are no ulcer or rashes noted. Psychiatric: The patient has normal affect. Cardiovascular: There is a regular rate and rhythm without significant murmur appreciated. Palpable pedal pulses.   No carotid bruits.   Diagnostic Studies  I have reviewed his CT scan with the following findings: 1. Stable non flow limiting dissection in the mid SMA, with 14 mm aneurysmal dilatation of the affected segment as before. 2. 17 mm fusiform left common iliac artery aneurysm, stable. 3. Cholelithiasis. 4. Right nephrolithiasis without hydronephrosis.   Assessment:  #1: history of non-flow-limiting dissection to the superior mesenteric artery with 14 mm diameter  #2:17 mm left common iliac aneurysm Plan:  the patient continues to do very well. He remains asymptomatic.  His CT scan findings are stable.  I have recommended follow-up in 2 years with a repeat CT scan.  Eldridge Abrahams, M.D. [facility] Office: [phone] Pager:  [phone]

## 2015-12-07 NOTE — Addendum Note (Signed)
Addended by: Delman Cheadle on: 12/07/2015 12:08 PM   Modules accepted: Miquel Dunn

## 2016-01-19 ENCOUNTER — Other Ambulatory Visit: Payer: Self-pay | Admitting: Family Medicine

## 2016-01-23 IMAGING — MR MR HEAD W/O CM
8 of 10 series · 35 of 48 positions shown · non-contrast
Comparison: 01/12/2015 CT.  No comparison MR.

CLINICAL DATA: 63-year-old male with left-sided weakness with
tingling and numbness over the past 3 days. Initial encounter.

EXAM:
MRI HEAD WITHOUT CONTRAST
TECHNIQUE: Multiplanar, multiecho pulse sequences of the brain and surrounding
structures were obtained without intravenous contrast.

[Series 3: DWI · axial · 3.0mm · 1.09mm/px · z∈[-111,+21]mm · 9 of 92 slices shown (1 of 4)]
[im 1/92]
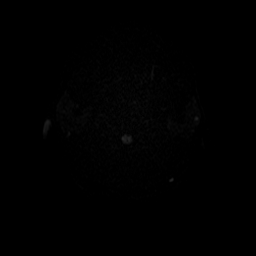
[im 12/92]
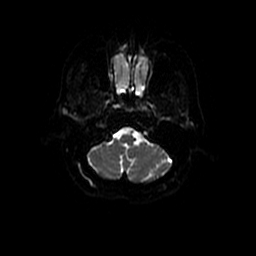
[im 23/92]
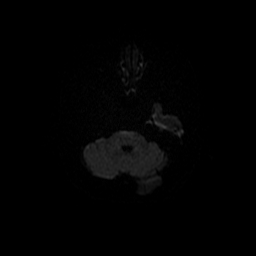
[im 35/92]
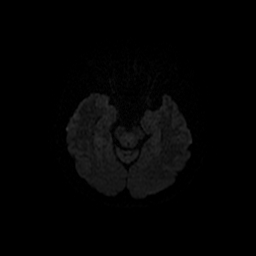
[im 46/92]
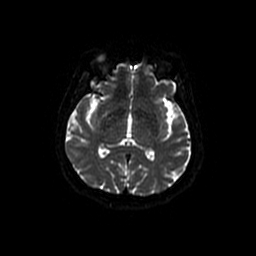
[im 57/92]
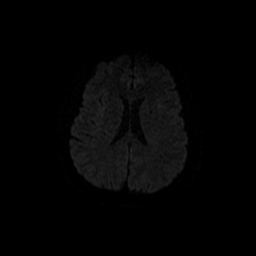
[im 69/92]
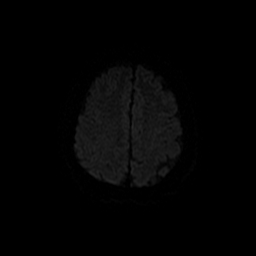
[im 80/92]
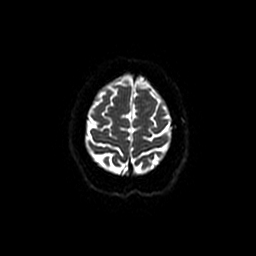
[im 92/92]
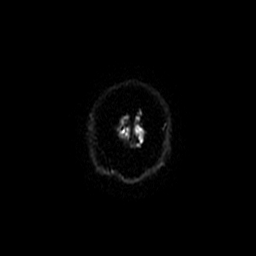

[Series 4: DWI · coronal · 5.0mm · 1.09mm/px · 6 of 64 slices shown (2 of 4)]
[im 1/64]
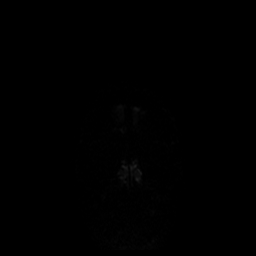
[im 13/64]
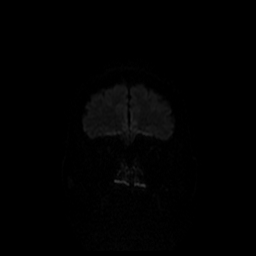
[im 26/64]
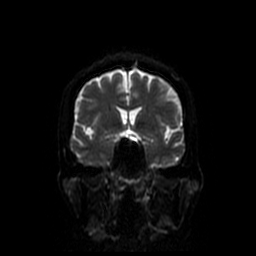
[im 38/64]
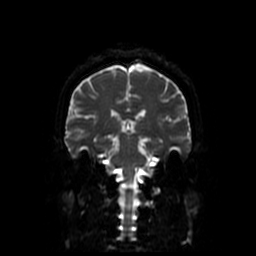
[im 51/64]
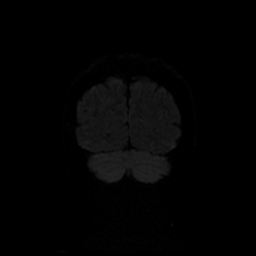
[im 64/64]
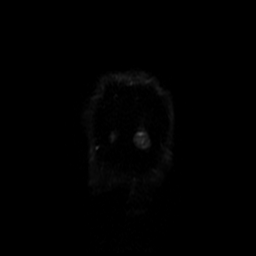

[Series 5: T1 · sagittal · 5.0mm · 0.47mm/px · 3 of 24 slices shown]
[im 1/24]
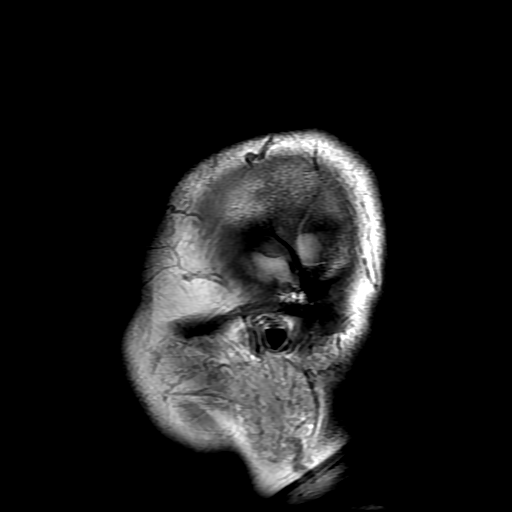
[im 12/24]
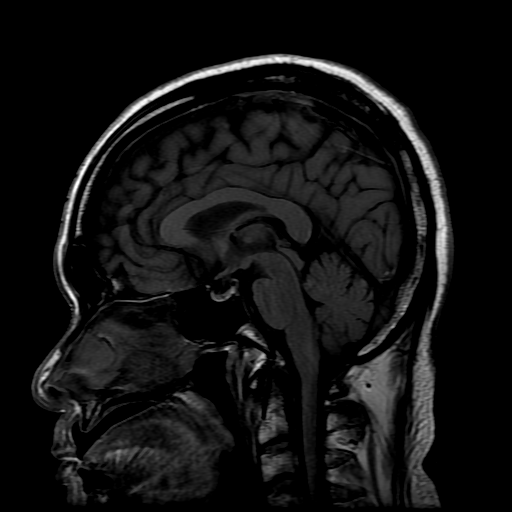
[im 24/24]
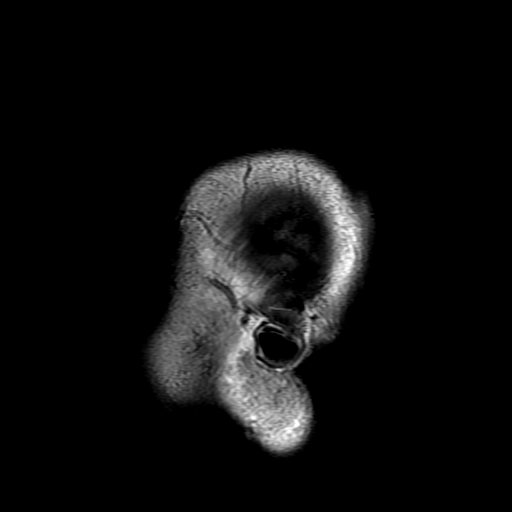

[Series 6: T2 · axial · 5.0mm · 0.43mm/px · z∈[-101,+35]mm · 3 of 24 slices shown (1 of 2)]
[im 1/24]
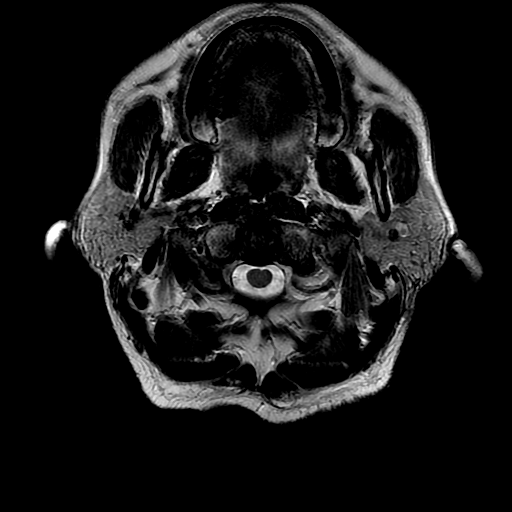
[im 12/24]
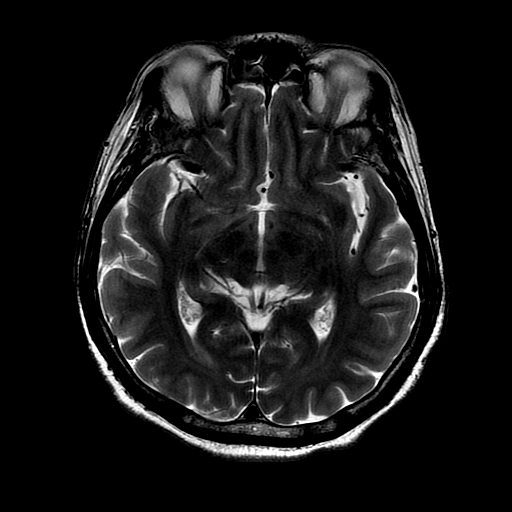
[im 24/24]
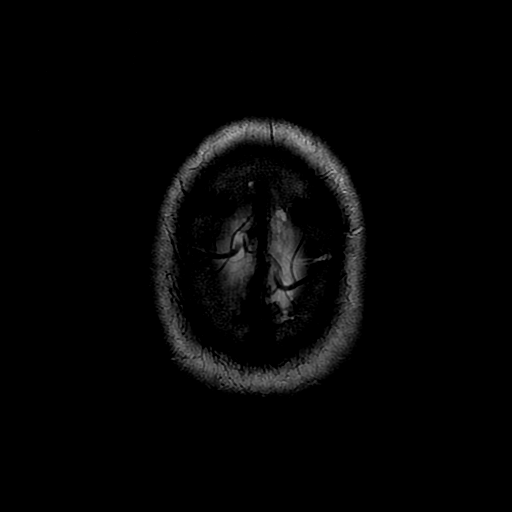

[Series 7: FLAIR · axial · 5.0mm · 0.43mm/px · z∈[-101,+35]mm · 3 of 24 slices shown]
[im 1/24]
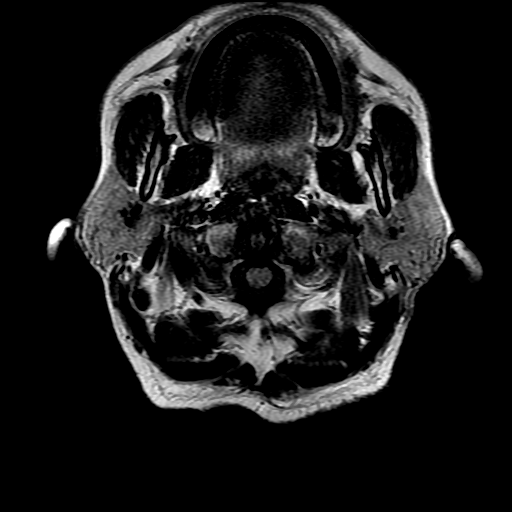
[im 12/24]
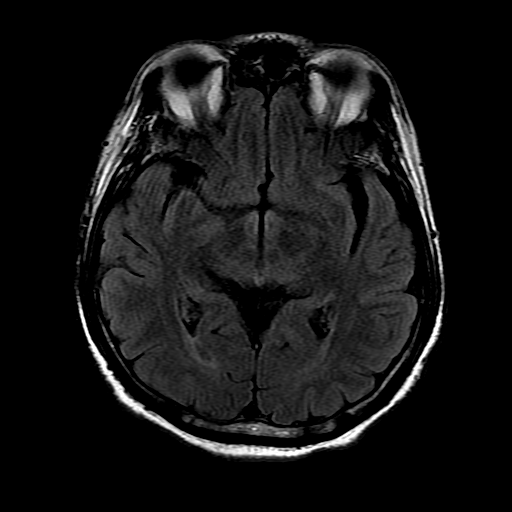
[im 24/24]
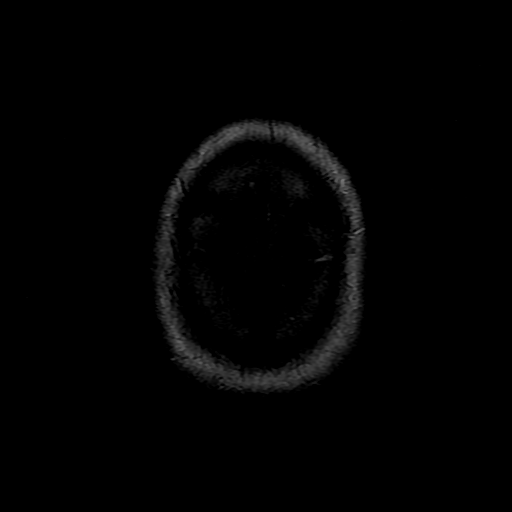

[Series 10: T2 · coronal · 5.0mm · 0.39mm/px · 3 of 25 slices shown (2 of 2)]
[im 1/25]
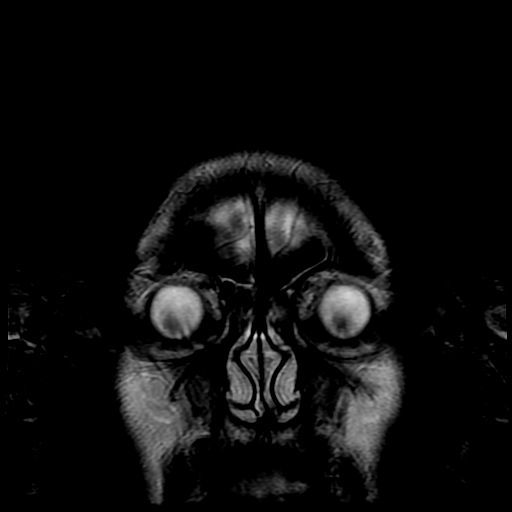
[im 13/25]
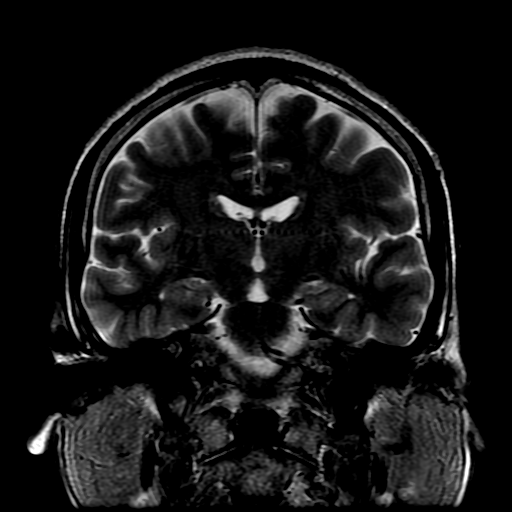
[im 25/25]
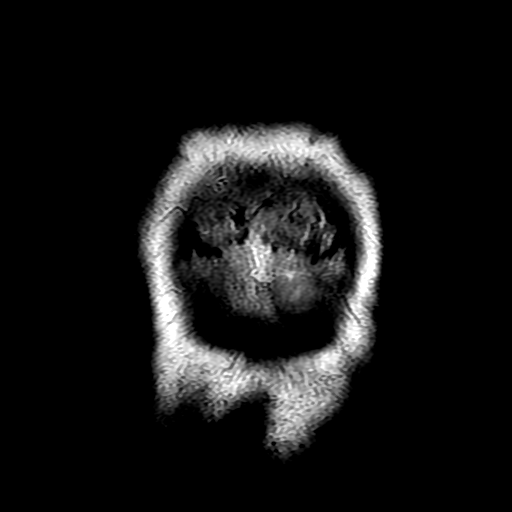

[Series 300: DWI · axial · 3.0mm · 1.09mm/px · z∈[-111,+21]mm · 5 of 46 slices shown (3 of 4)]
[im 1/46]
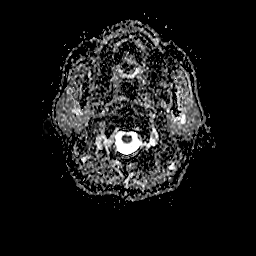
[im 12/46]
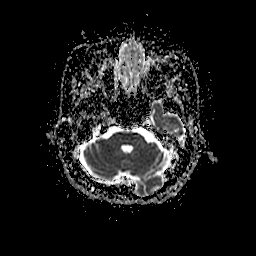
[im 23/46]
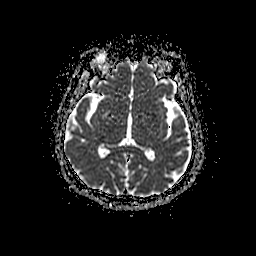
[im 34/46]
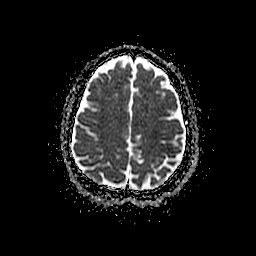
[im 46/46]
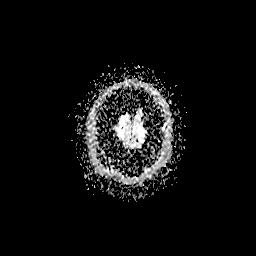

[Series 400: DWI · coronal · 5.0mm · 1.09mm/px · 3 of 32 slices shown (4 of 4)]
[im 1/32]
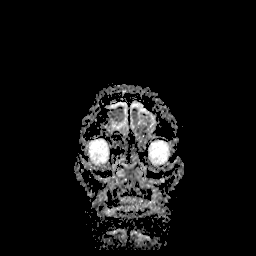
[im 16/32]
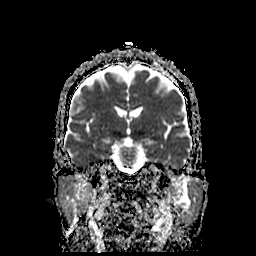
[im 32/32]
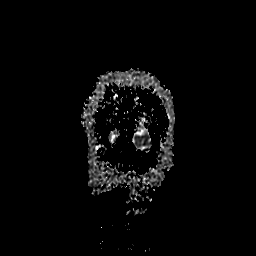

[35 of 48 positions shown; findings below may reference images not displayed]

FINDINGS: No acute infarct.

Mild small vessel disease type changes. Remote small right caudate
head infarct.

No intracranial hemorrhage.

No hydrocephalus.

No intracranial mass lesion noted on this unenhanced exam.

Major intracranial vascular structures are patent.

Cervical medullary junction, pituitary region, pineal region and
orbital structures unremarkable.

Minimal to mild mucosal thickening paranasal sinuses most notable
ethmoid sinus air cells.
IMPRESSION: No acute infarct.

Mild small vessel disease type changes.

Remote tiny right caudate head infarct.

Minimal to mild mucosal thickening paranasal sinuses most notable
ethmoid sinus air cells.

## 2016-02-15 ENCOUNTER — Other Ambulatory Visit: Payer: Self-pay | Admitting: Family Medicine

## 2016-02-15 DIAGNOSIS — H40033 Anatomical narrow angle, bilateral: Secondary | ICD-10-CM | POA: Diagnosis not present

## 2016-02-15 DIAGNOSIS — E119 Type 2 diabetes mellitus without complications: Secondary | ICD-10-CM | POA: Diagnosis not present

## 2016-04-07 ENCOUNTER — Other Ambulatory Visit: Payer: Self-pay | Admitting: Family Medicine

## 2016-04-07 ENCOUNTER — Other Ambulatory Visit: Payer: Self-pay | Admitting: Physician Assistant

## 2016-05-24 ENCOUNTER — Other Ambulatory Visit: Payer: Self-pay | Admitting: Physician Assistant

## 2016-05-24 ENCOUNTER — Other Ambulatory Visit: Payer: Self-pay | Admitting: Family Medicine

## 2016-05-25 NOTE — Telephone Encounter (Signed)
Spoke to Mcadams Cray son Pro  Explained father needs a follow up for his Diabetes.  He is going to call father and explain this and book an appointment.

## 2016-10-11 ENCOUNTER — Inpatient Hospital Stay (HOSPITAL_COMMUNITY)
Admission: EM | Admit: 2016-10-11 | Discharge: 2016-10-13 | DRG: 638 | Disposition: A | Discharge status: Home / Self Care | Payer: Medicare Other | Attending: Internal Medicine | Admitting: Internal Medicine

## 2016-10-11 ENCOUNTER — Encounter (HOSPITAL_COMMUNITY): Payer: Self-pay

## 2016-10-11 ENCOUNTER — Ambulatory Visit (INDEPENDENT_AMBULATORY_CARE_PROVIDER_SITE_OTHER): Payer: Medicare Other | Admitting: Family Medicine

## 2016-10-11 VITALS — BP 138/52 | HR 73 | Temp 97.4°F | Resp 18 | Ht 66.0 in | Wt 155.0 lb

## 2016-10-11 DIAGNOSIS — I10 Essential (primary) hypertension: Secondary | ICD-10-CM | POA: Diagnosis not present

## 2016-10-11 DIAGNOSIS — R739 Hyperglycemia, unspecified: Secondary | ICD-10-CM | POA: Diagnosis present

## 2016-10-11 DIAGNOSIS — Z8673 Personal history of transient ischemic attack (TIA), and cerebral infarction without residual deficits: Secondary | ICD-10-CM | POA: Diagnosis not present

## 2016-10-11 DIAGNOSIS — N179 Acute kidney failure, unspecified: Secondary | ICD-10-CM | POA: Diagnosis present

## 2016-10-11 DIAGNOSIS — E1140 Type 2 diabetes mellitus with diabetic neuropathy, unspecified: Secondary | ICD-10-CM

## 2016-10-11 DIAGNOSIS — Z7902 Long term (current) use of antithrombotics/antiplatelets: Secondary | ICD-10-CM | POA: Diagnosis not present

## 2016-10-11 DIAGNOSIS — Z91138 Patient's unintentional underdosing of medication regimen for other reason: Secondary | ICD-10-CM | POA: Diagnosis not present

## 2016-10-11 DIAGNOSIS — Z8249 Family history of ischemic heart disease and other diseases of the circulatory system: Secondary | ICD-10-CM

## 2016-10-11 DIAGNOSIS — Z833 Family history of diabetes mellitus: Secondary | ICD-10-CM

## 2016-10-11 DIAGNOSIS — Z91199 Patient's noncompliance with other medical treatment and regimen due to unspecified reason: Secondary | ICD-10-CM

## 2016-10-11 DIAGNOSIS — Z79899 Other long term (current) drug therapy: Secondary | ICD-10-CM

## 2016-10-11 DIAGNOSIS — Z794 Long term (current) use of insulin: Secondary | ICD-10-CM | POA: Diagnosis not present

## 2016-10-11 DIAGNOSIS — I2510 Atherosclerotic heart disease of native coronary artery without angina pectoris: Secondary | ICD-10-CM | POA: Diagnosis present

## 2016-10-11 DIAGNOSIS — Z789 Other specified health status: Secondary | ICD-10-CM

## 2016-10-11 DIAGNOSIS — Z758 Other problems related to medical facilities and other health care: Secondary | ICD-10-CM | POA: Diagnosis present

## 2016-10-11 DIAGNOSIS — I7779 Dissection of other artery: Secondary | ICD-10-CM

## 2016-10-11 DIAGNOSIS — E1101 Type 2 diabetes mellitus with hyperosmolarity with coma: Secondary | ICD-10-CM | POA: Diagnosis not present

## 2016-10-11 DIAGNOSIS — I252 Old myocardial infarction: Secondary | ICD-10-CM

## 2016-10-11 DIAGNOSIS — Z7982 Long term (current) use of aspirin: Secondary | ICD-10-CM

## 2016-10-11 DIAGNOSIS — E785 Hyperlipidemia, unspecified: Secondary | ICD-10-CM

## 2016-10-11 DIAGNOSIS — Z87891 Personal history of nicotine dependence: Secondary | ICD-10-CM

## 2016-10-11 DIAGNOSIS — Z9119 Patient's noncompliance with other medical treatment and regimen: Secondary | ICD-10-CM | POA: Diagnosis not present

## 2016-10-11 DIAGNOSIS — E1100 Type 2 diabetes mellitus with hyperosmolarity without nonketotic hyperglycemic-hyperosmolar coma (NKHHC): Secondary | ICD-10-CM | POA: Diagnosis not present

## 2016-10-11 DIAGNOSIS — Z599 Problem related to housing and economic circumstances, unspecified: Secondary | ICD-10-CM | POA: Diagnosis not present

## 2016-10-11 DIAGNOSIS — R631 Polydipsia: Secondary | ICD-10-CM | POA: Diagnosis present

## 2016-10-11 DIAGNOSIS — Z955 Presence of coronary angioplasty implant and graft: Secondary | ICD-10-CM | POA: Diagnosis not present

## 2016-10-11 DIAGNOSIS — T383X6A Underdosing of insulin and oral hypoglycemic [antidiabetic] drugs, initial encounter: Secondary | ICD-10-CM | POA: Diagnosis present

## 2016-10-11 DIAGNOSIS — Z8679 Personal history of other diseases of the circulatory system: Secondary | ICD-10-CM

## 2016-10-11 DIAGNOSIS — E1165 Type 2 diabetes mellitus with hyperglycemia: Secondary | ICD-10-CM | POA: Diagnosis not present

## 2016-10-11 DIAGNOSIS — R358 Other polyuria: Secondary | ICD-10-CM | POA: Diagnosis present

## 2016-10-11 DIAGNOSIS — E118 Type 2 diabetes mellitus with unspecified complications: Secondary | ICD-10-CM

## 2016-10-11 LAB — COMPREHENSIVE METABOLIC PANEL
ALT: 20 IU/L (ref 0–44)
AST: 17 IU/L (ref 0–40)
Albumin/Globulin Ratio: 1.6 (ref 1.2–2.2)
Albumin: 4.2 g/dL (ref 3.6–4.8)
Alkaline Phosphatase: 173 IU/L — ABNORMAL HIGH (ref 39–117)
BUN/Creatinine Ratio: 19 (ref 10–24)
BUN: 21 mg/dL (ref 8–27)
Bilirubin Total: 0.4 mg/dL (ref 0.0–1.2)
CO2: 30 mmol/L — ABNORMAL HIGH (ref 18–29)
Calcium: 10 mg/dL (ref 8.6–10.2)
Chloride: 86 mmol/L — ABNORMAL LOW (ref 96–106)
Creatinine, Ser: 1.13 mg/dL (ref 0.76–1.27)
GFR calc Af Amer: 78 mL/min/{1.73_m2} (ref >59)
GFR calc non Af Amer: 67 mL/min/{1.73_m2} (ref >59)
Globulin, Total: 2.7 g/dL (ref 1.5–4.5)
Glucose: 935 mg/dL (ref 65–99)
Potassium: 5.4 mmol/L — ABNORMAL HIGH (ref 3.5–5.2)
Sodium: 123 mmol/L — ABNORMAL LOW (ref 134–144)
Total Protein: 6.9 g/dL (ref 6.0–8.5)

## 2016-10-11 LAB — GLUCOSE, POCT (MANUAL RESULT ENTRY)

## 2016-10-11 LAB — CBC
HCT: 44.9 % (ref 39.0–52.0)
Hemoglobin: 15.2 g/dL (ref 13.0–17.0)
MCH: 28.3 pg (ref 26.0–34.0)
MCHC: 33.9 g/dL (ref 30.0–36.0)
MCV: 83.6 fL (ref 78.0–100.0)
Platelets: 149 10*3/uL — ABNORMAL LOW (ref 150–400)
RBC: 5.37 MIL/uL (ref 4.22–5.81)
RDW: 12.7 % (ref 11.5–15.5)
WBC: 6.1 10*3/uL (ref 4.0–10.5)

## 2016-10-11 LAB — I-STAT VENOUS BLOOD GAS, ED
Acid-Base Excess: 1 mmol/L (ref 0.0–2.0)
Bicarbonate: 27.8 mmol/L (ref 20.0–28.0)
O2 Saturation: 71 %
TCO2: 29 mmol/L (ref 0–100)
pCO2, Ven: 53.4 mmHg (ref 44.0–60.0)
pH, Ven: 7.325 (ref 7.250–7.430)
pO2, Ven: 41 mmHg (ref 32.0–45.0)

## 2016-10-11 LAB — BASIC METABOLIC PANEL
Anion gap: 11 (ref 5–15)
BUN: 17 mg/dL (ref 6–20)
CO2: 29 mmol/L (ref 22–32)
Calcium: 9.6 mg/dL (ref 8.9–10.3)
Chloride: 85 mmol/L — ABNORMAL LOW (ref 101–111)
Creatinine, Ser: 1.37 mg/dL — ABNORMAL HIGH (ref 0.61–1.24)
GFR calc Af Amer: >60 mL/min (ref >60)
GFR calc non Af Amer: 52 mL/min — ABNORMAL LOW (ref >60)
Glucose, Bld: 885 mg/dL (ref 65–99)
Potassium: 4.5 mmol/L (ref 3.5–5.1)
Sodium: 125 mmol/L — ABNORMAL LOW (ref 135–145)

## 2016-10-11 LAB — URINALYSIS, ROUTINE W REFLEX MICROSCOPIC
Bacteria, UA: NONE SEEN
Bilirubin Urine: NEGATIVE
Glucose, UA: >=500 mg/dL — AB
Hgb urine dipstick: NEGATIVE
Ketones, ur: NEGATIVE mg/dL
Leukocytes, UA: NEGATIVE
Nitrite: NEGATIVE
Protein, ur: NEGATIVE mg/dL
RBC / HPF: NONE SEEN RBC/hpf (ref 0–5)
Specific Gravity, Urine: 1.022 (ref 1.005–1.030)
Squamous Epithelial / LPF: NONE SEEN
pH: 8 (ref 5.0–8.0)

## 2016-10-11 LAB — POCT URINALYSIS DIP (MANUAL ENTRY)
Bilirubin, UA: NEGATIVE
Glucose, UA: 500 — AB
Ketones, POC UA: NEGATIVE
Leukocytes, UA: NEGATIVE
Nitrite, UA: NEGATIVE
Protein Ur, POC: NEGATIVE
Spec Grav, UA: <=1.005
Urobilinogen, UA: 0.2
pH, UA: 6

## 2016-10-11 LAB — CBG MONITORING, ED
Glucose-Capillary: >600 mg/dL (ref 65–99)
Glucose-Capillary: >600 mg/dL (ref 65–99)
Glucose-Capillary: 545 mg/dL (ref 65–99)
Glucose-Capillary: 224 mg/dL — ABNORMAL HIGH (ref 65–99)
Glucose-Capillary: 196 mg/dL — ABNORMAL HIGH (ref 65–99)
Glucose-Capillary: 147 mg/dL — ABNORMAL HIGH (ref 65–99)

## 2016-10-11 LAB — POCT GLYCOSYLATED HEMOGLOBIN (HGB A1C): Hemoglobin A1C: 14

## 2016-10-11 MED ORDER — POLYETHYLENE GLYCOL 3350 17 G PO PACK: 17.0000 g | PACK | Freq: Two times a day (BID) | ORAL | Status: DC | PRN

## 2016-10-11 MED ORDER — GLUCOSE BLOOD VI STRP: ORAL_STRIP | 10 refills | Status: DC

## 2016-10-11 MED ORDER — SODIUM CHLORIDE 0.9 % IV SOLN: INTRAVENOUS | Status: DC

## 2016-10-11 MED ORDER — CLOPIDOGREL BISULFATE 75 MG PO TABS
75.0000 mg | ORAL_TABLET | Freq: Every day | ORAL | Status: DC
  Administered 2016-10-12 – 2016-10-13 (×2): 75 mg via ORAL
  Filled 2016-10-11 (×2): qty 1

## 2016-10-11 MED ORDER — SODIUM CHLORIDE 0.9 % IV BOLUS (SEPSIS)
1000.0000 mL | Freq: Once | INTRAVENOUS | Status: AC
Start: 2016-10-11 — End: 2016-10-11
  Administered 2016-10-11: 1000 mL via INTRAVENOUS

## 2016-10-11 MED ORDER — INSULIN PEN NEEDLE 32G X 5 MM MISC: 0 refills | Status: DC

## 2016-10-11 MED ORDER — INSULIN DETEMIR 100 UNIT/ML ~~LOC~~ SOLN
10.0000 [IU] | Freq: Every day | SUBCUTANEOUS | Status: DC
  Administered 2016-10-11: 10 [IU] via SUBCUTANEOUS
  Filled 2016-10-11: qty 0.1

## 2016-10-11 MED ORDER — DEXTROSE-NACL 5-0.45 % IV SOLN
INTRAVENOUS | Status: DC
  Administered 2016-10-11: 20:00:00 via INTRAVENOUS

## 2016-10-11 MED ORDER — ENOXAPARIN SODIUM 40 MG/0.4ML ~~LOC~~ SOLN
40.0000 mg | SUBCUTANEOUS | Status: DC
  Administered 2016-10-13: 40 mg via SUBCUTANEOUS
  Filled 2016-10-11: qty 0.4

## 2016-10-11 MED ORDER — INSULIN REGULAR BOLUS VIA INFUSION
0.0000 [IU] | Freq: Three times a day (TID) | INTRAVENOUS | Status: DC
  Filled 2016-10-11: qty 10

## 2016-10-11 MED ORDER — SODIUM CHLORIDE 0.9 % IV SOLN
INTRAVENOUS | Status: DC
  Administered 2016-10-11: 5.4 [IU]/h via INTRAVENOUS
  Filled 2016-10-11: qty 2.5

## 2016-10-11 MED ORDER — ISOSORBIDE MONONITRATE ER 30 MG PO TB24
30.0000 mg | ORAL_TABLET | Freq: Every day | ORAL | Status: DC
  Administered 2016-10-12 – 2016-10-13 (×2): 30 mg via ORAL
  Filled 2016-10-11 (×2): qty 1

## 2016-10-11 MED ORDER — DOCUSATE SODIUM 100 MG PO CAPS
100.0000 mg | ORAL_CAPSULE | Freq: Every day | ORAL | Status: DC
  Administered 2016-10-13: 100 mg via ORAL
  Filled 2016-10-11 (×2): qty 1

## 2016-10-11 MED ORDER — SODIUM CHLORIDE 0.9 % IV SOLN
INTRAVENOUS | Status: DC
  Administered 2016-10-11: 17:00:00 via INTRAVENOUS

## 2016-10-11 MED ORDER — LIRAGLUTIDE 18 MG/3ML ~~LOC~~ SOPN: PEN_INJECTOR | SUBCUTANEOUS | 0 refills | Status: DC

## 2016-10-11 MED ORDER — DEXTROSE 50 % IV SOLN: 25.0000 mL | INTRAVENOUS | Status: DC | PRN

## 2016-10-11 MED ORDER — GABAPENTIN 300 MG PO CAPS: ORAL_CAPSULE | ORAL | 0 refills | Status: DC

## 2016-10-11 MED ORDER — INSULIN DETEMIR 100 UNIT/ML FLEXPEN: 10.0000 [IU] | PEN_INJECTOR | Freq: Every day | SUBCUTANEOUS | 0 refills | Status: DC

## 2016-10-11 MED ORDER — PANTOPRAZOLE SODIUM 40 MG PO TBEC
40.0000 mg | DELAYED_RELEASE_TABLET | Freq: Every day | ORAL | Status: DC
  Administered 2016-10-12 – 2016-10-13 (×3): 40 mg via ORAL
  Filled 2016-10-11 (×3): qty 1

## 2016-10-11 MED ORDER — CARVEDILOL 6.25 MG PO TABS
6.2500 mg | ORAL_TABLET | Freq: Two times a day (BID) | ORAL | Status: DC
  Administered 2016-10-12 – 2016-10-13 (×3): 6.25 mg via ORAL
  Filled 2016-10-11 (×3): qty 1

## 2016-10-11 MED ORDER — LANCETS MISC: 3 refills | Status: DC

## 2016-10-11 MED ORDER — ROSUVASTATIN CALCIUM 40 MG PO TABS
40.0000 mg | ORAL_TABLET | Freq: Every day | ORAL | Status: DC
  Administered 2016-10-12 – 2016-10-13 (×3): 40 mg via ORAL
  Filled 2016-10-11 (×3): qty 1
  Filled 2016-10-11 (×3): qty 4

## 2016-10-11 MED ORDER — DEXTROSE-NACL 5-0.45 % IV SOLN: INTRAVENOUS | Status: DC

## 2016-10-11 MED ORDER — GABAPENTIN 300 MG PO CAPS
300.0000 mg | ORAL_CAPSULE | Freq: Three times a day (TID) | ORAL | Status: DC
  Administered 2016-10-12 – 2016-10-13 (×4): 300 mg via ORAL
  Filled 2016-10-11 (×5): qty 1

## 2016-10-11 MED ORDER — ONDANSETRON HCL 4 MG PO TABS: 4.0000 mg | ORAL_TABLET | Freq: Four times a day (QID) | ORAL | Status: DC | PRN

## 2016-10-11 MED ORDER — ACETAMINOPHEN 650 MG RE SUPP: 650.0000 mg | Freq: Four times a day (QID) | RECTAL | Status: DC | PRN

## 2016-10-11 MED ORDER — FAMOTIDINE 20 MG PO TABS
20.0000 mg | ORAL_TABLET | Freq: Two times a day (BID) | ORAL | Status: DC
  Administered 2016-10-12 – 2016-10-13 (×4): 20 mg via ORAL
  Filled 2016-10-11 (×4): qty 1

## 2016-10-11 MED ORDER — RANOLAZINE ER 500 MG PO TB12
500.0000 mg | ORAL_TABLET | Freq: Two times a day (BID) | ORAL | Status: DC
  Administered 2016-10-12 – 2016-10-13 (×3): 500 mg via ORAL
  Filled 2016-10-11 (×4): qty 1

## 2016-10-11 MED ORDER — INSULIN ASPART 100 UNIT/ML ~~LOC~~ SOLN
0.0000 [IU] | SUBCUTANEOUS | Status: DC
  Administered 2016-10-12: 1 [IU] via SUBCUTANEOUS
  Administered 2016-10-12: 2 [IU] via SUBCUTANEOUS
  Administered 2016-10-12: 1 [IU] via SUBCUTANEOUS

## 2016-10-11 MED ORDER — AMLODIPINE BESYLATE 5 MG PO TABS: 5.0000 mg | ORAL_TABLET | Freq: Every day | ORAL | Status: DC

## 2016-10-11 MED ORDER — ONDANSETRON HCL 4 MG/2ML IJ SOLN: 4.0000 mg | Freq: Four times a day (QID) | INTRAMUSCULAR | Status: DC | PRN

## 2016-10-11 MED ORDER — ASPIRIN 81 MG PO TBEC: 81.0000 mg | DELAYED_RELEASE_TABLET | Freq: Every day | ORAL | Status: DC

## 2016-10-11 MED ORDER — ACETAMINOPHEN 325 MG PO TABS: 650.0000 mg | ORAL_TABLET | Freq: Four times a day (QID) | ORAL | Status: DC | PRN

## 2016-10-11 NOTE — ED Triage Notes (Signed)
Per Pt and family, Pt ran out of insulin about five months ago. Pt went to MD office to get refilled and blood sugar was to be noted to be 900+. Denies any nausea, vomiting, or diarrhea. Alert and oriented x4. No urinary symptoms.

## 2016-10-11 NOTE — ED Notes (Signed)
Spoke to pharmacy about Insulin and Levemir.  Give Levemir and stop Insulin drip 1-2 hr after was communicated to the upcoming RN

## 2016-10-11 NOTE — Patient Instructions (Addendum)
It is very important that you follow up regularly and stay on medicine as prescribed to lessen risk of complications such as another heart attack, stroke, kidney failure or other complications.   Your sugar level is too high to read today, but will send out labs that will return in a few hours. Depending on that level, I may call you to be seen in emergency room. For now, restart your previous insulin and Victoza dose and return for recheck in next 1 week. If any nausea or vomiting, abdominal pain, dizziness or new/worse symptoms - go to emergency room. I am also referring you to endocrinology for your diabetes, but that appointment will likely be after follow up here in next week.   Call your cardiologist (Dr. Aundra Dubin) for follow up as you appear to be overdue for follow up.  [address], [place], [place] [address]  Phone: [phone]  Return to the clinic or go to the nearest emergency room if any of your symptoms worsen or new symptoms occur.    Diabetes Mellitus and Standards of Medical Care Managing diabetes (diabetes mellitus) can be complicated. Your diabetes treatment may be managed by a team of health care providers, including:  A diet and nutrition specialist (registered dietitian).  A nurse.  A certified diabetes educator (CDE).  A diabetes specialist (endocrinologist).  An eye doctor.  A primary care provider.  A dentist. Your health care providers follow a schedule in order to help you get the best quality of care. The following schedule is a general guideline for your diabetes management plan. Your health care providers may also give you more specific instructions. HbA1c ( hemoglobin A1c) test This test provides information about blood sugar (glucose) control over the previous 2-3 months. It is used to check whether your diabetes management plan needs to be adjusted.  If you are meeting your treatment goals, this test is done at least 2 times a year.  If you  are not meeting treatment goals or if your treatment goals have changed, this test is done 4 times a year. Blood pressure test  This test is done at every routine medical visit. For most people, the goal is less than 140/90. In some cases, your goal blood pressure may be 130/80 or less. Ask your health care provider what your goal blood pressure should be. Dental and eye exams  Visit your dentist two times a year.  If you have type 1 diabetes, get an eye exam 3-5 years after you are diagnosed, and then once a year after your first exam.  If you were diagnosed with type 1 diabetes as a child, get an eye exam when you are age 28 or older and have had diabetes for 3-5 years. After the first exam, you should get an eye exam once a year.  If you have type 2 diabetes, have an eye exam as soon as you are diagnosed, and then once a year after your first exam. Foot care exam  Visual foot exams are done at every routine medical visit. The exams check for cuts, bruises, redness, blisters, sores, or other problems with the feet.  A complete foot exam is done by your health care provider once a year. This exam includes an inspection of the structure and skin of your feet, and a check of the pulses and sensation in your feet.  Type 1 diabetes: Get your first exam 3-5 years after diagnosis.  Type 2 diabetes: Get your first  exam as soon as you are diagnosed.  Check your feet every day for cuts, bruises, redness, blisters, or sores. If you have any of these or other problems that are not healing, contact your health care provider. Kidney function test ( urine microalbumin)  This test is done once a year.  Type 1 diabetes: Get your first test 5 years after diagnosis.  Type 2 diabetes: Get your first test as soon as you are diagnosed.  If you have chronic kidney disease (CKD), get a serum creatinine and estimated glomerular filtration rate (eGFR) test once a year. Lipid profile (cholesterol, HDL, LDL,  triglycerides)  This test should be done when you are diagnosed with diabetes, and every 5 years after the first test. If you are on medicines to lower your cholesterol, you may need to get this test done every year.  The goal for LDL is less than 100 mg/dL (5.5 mmol/L). If you are at high risk, the goal is less than 70 mg/dL (3.9 mmol/L).  The goal for HDL is 40 mg/dL (2.2 mmol/L) for men and 50 mg/dL(2.8 mmol/L) for women. An HDL cholesterol of 60 mg/dL (3.3 mmol/L) or higher gives some protection against heart disease.  The goal for triglycerides is less than 150 mg/dL (8.3 mmol/L). Immunizations  The yearly flu (influenza) vaccine is recommended for everyone 6 months or older who has diabetes.  The pneumonia (pneumococcal) vaccine is recommended for everyone 2 years or older who has diabetes. If you are 30 or older, you may get the pneumonia vaccine as a series of two separate shots.  The hepatitis B vaccine is recommended for adults shortly after they have been diagnosed with diabetes.  The Tdap (tetanus, diphtheria, and pertussis) vaccine should be given:  According to normal childhood vaccination schedules, for children.  Every 10 years, for adults who have diabetes.  The shingles vaccine is recommended for people who have had chicken pox and are 50 years or older. Mental and emotional health  Screening for symptoms of eating disorders, anxiety, and depression is recommended at the time of diagnosis and afterward as needed. If your screening shows that you have symptoms (you have a positive screening result), you may need further evaluation and be referred to a mental health care provider. Diabetes self-management education  Education about how to manage your diabetes is recommended at diagnosis and ongoing as needed. Treatment plan  Your treatment plan will be reviewed at every medical visit. Summary  Managing diabetes (diabetes mellitus) can be complicated. Your diabetes  treatment may be managed by a team of health care providers.  Your health care providers follow a schedule in order to help you get the best quality of care.  Standards of care including having regular physical exams, blood tests, blood pressure monitoring, immunizations, screening tests, and education about how to manage your diabetes.  Your health care providers may also give you more specific instructions based on your individual health. This information is not intended to replace advice given to you by your health care provider. Make sure you discuss any questions you have with your health care provider. Document Released: 07/10/2009 Document Revised: 06/10/2016 Document Reviewed: 06/10/2016 Elsevier Interactive Patient Education  2017 [employer].    IF you received an x-ray today, you will receive an invoice from [facility]. Please contact [facility] at [phone] with questions or concerns regarding your invoice.   IF you received labwork today, you will receive an invoice from [employer]. Please contact [facility] at  [phone] with questions or concerns regarding your invoice.   Our billing staff will not be able to assist you with questions regarding bills from these companies.  You will be contacted with the lab results as soon as they are available. The fastest way to get your results is to activate your My Chart account. Instructions are located on the last page of this paperwork. If you have not heard from [place] regarding the results in 2 weeks, please contact this office.

## 2016-10-11 NOTE — H&P (Signed)
[facility] History and Physical  Mar Reichart TIW:[id number] DOB: [birth year] DOA: 10/11/2016  Referring physician: Dr Zenia Resides PCP: Lamar Blinks, MD   Chief Complaint: Uncont diabetes w fatigue  HPI: Barry [name] is a 66 y.o. male from [place] presenting with fatigues, dizziness and high BS's at home.  History obtained through [place] interpreter.  Patient was feeling tired and worn out the last 2 days.  Checked his BS and was 450.  Went to see PCP and BS there was > 500.  Instructed to go to ED and BS here is 900.  Asked to see for admission.   Patient denies any problem getting / taking his medications or insulin.  He lives with his wife and children. He grew up in [place], came to [place] 1984.  Lives in [place].  Quit smoking 4-5 yrs ago.  He worked at [employer] for years then went on disability at the time he had a stroke in 2011.  Had L arm weak from the stroke and didn't walk for a long time, but now he walks small distances. He denies and recent CP, SOB, cough, fevers, chills, n/v/d, joint pain, sore throat.    Old chart 2011 - CVA w L arm weak, HTN, DM2, tobacco > 40+, hx AAA and mesenteric art dissection Jun '15 - NSTEMI > underwent RCA DES for single vessel disease, EF 45% Jun '15 - another CP episode, CTA neg, went for cath again and had 90% LCX w stent placed Jun '15 - abd pain, CT showed stable mesenteric art dissection. Per vasc surg prob chronic mesentertic ischemia. Had AKI Less[place] CTA of abd was not done.  Pt improved and creat dropped to 1.0, dcd' home.   Sept '16 - unstable angina, rx'd with IV hep, trop neg and EKG nonischemic. Had cath showing patent stents.  dc'd home , medical Rx.   ROS  denies CP  no joint pain   no HA  no blurry vision  no rash  no diarrhea  no nausea/ vomiting  no dysuria  no difficulty voiding  no change in urine color    Past Medical History  Past Medical History:  Diagnosis Date  . CAD (coronary artery disease)    a. LHC (02/26/14):   mLAD 20 (faint L>R collats), mCFX 50, pRCA 95 (plaque rupture) >> PCI:  Xience DES to pRCA (severe tortuosity of R innominate artery >> needs L radial or FA in future);  b. LHC (03/10/14):  CFX 90, oOM 70, pRCA stent patent >> PCI:  Xience DES to mCFX   . Carotid stenosis    a. Carotid US (02/27/14):  Bilateral ICA 1-39%  . Diabetes mellitus   . 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[place])    a. Mesenteric Artery Duplex (7/15):  pSMA chronic dissection with aneurysmal dilation of 1.29 cm (V[facility]);  b.  Chest CTA (02/26/14):  IMPRESSION:  1. No aortic dissection or other acute abnormality.  2. 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  3. Stable 17 mm ectasia of the left common iliac artery.  4. Atherosclerosis, including aortoiliac and coronary artery disease.   Marland Kitchen History of echocardiogram 09/2015   Echo 1/17: mod LVH, EF 55%, inf-lat HK, Gr 1 DD, mild LAE  . Hx of cardiovascular stress test    Lexiscan Myoview (2/16):  Inferior lateral defect c/w thinning vs small prior infarct, no ischemia, EF 49%, Low Risk  . Hx of echocardiogram    a.  Echocardiogram (02/27/14):  Mod focal basal and mild  concentric LVH, EF 50-55%, no RWMA, Gr 1 DD, mild TR, normal RVF  . Hyperlipidemia   . Hypertension   . Iliac artery aneurysm, left ([place])   . Myocardial infarction   . Stroke [facility])    Past Surgical History  Past Surgical History:  Procedure Laterality Date  . Admission  09/26/2009   CVA.  [name].  Marland Kitchen CARDIAC CATHETERIZATION    . CARDIAC CATHETERIZATION N/A 06/25/2015   Procedure: Left Heart Cath and Coronary Angiography;  Surgeon: Burnell Blanks, MD;  Location: [facility] CV LAB;  Service: Cardiovascular;  Laterality: N/A;  . COLONOSCOPY WITH PROPOFOL N/A 07/23/2015   Procedure: COLONOSCOPY WITH PROPOFOL;  Surgeon: [name], MD;  Location: [facility];  Service: Endoscopy;  Laterality: N/A;  . CORONARY ANGIOPLASTY    . LEFT HEART CATHETERIZATION WITH CORONARY ANGIOGRAM N/A 02/26/2014    Procedure: LEFT HEART CATHETERIZATION WITH CORONARY ANGIOGRAM;  Surgeon: Wellington Hampshire, MD;  Location: [facility] CATH LAB;  Service: Cardiovascular;  Laterality: N/A;  . LEFT HEART CATHETERIZATION WITH CORONARY ANGIOGRAM N/A 03/10/2014   Procedure: LEFT HEART CATHETERIZATION WITH CORONARY ANGIOGRAM;  Surgeon: Jettie Booze, MD;  Location: [facility] CATH LAB;  Service: Cardiovascular;  Laterality: N/A;   Family History  Family History  Problem Relation Age of Onset  . Diabetes Father   . Hypertension Father   . Heart attack Father   . Stroke Neg Hx    Social History  reports that he quit smoking about 7 years ago. He has never used smokeless tobacco. He reports that he does not drink alcohol or use drugs. Allergies No Known Allergies Home medications Prior to Admission medications   Medication Sig Start Date End Date Taking? Authorizing Provider  amLODipine (NORVASC) 5 MG tablet Take 1 tablet (5 mg total) by mouth daily. 04/20/15   Liliane Shi, PA-C  aspirin 81 MG EC tablet Take 1 tablet (81 mg total) by mouth daily. 02/12/15   Larey Dresser, MD  Blood Glucose Monitoring Suppl (ONE TOUCH ULTRA SYSTEM KIT) W/DEVICE KIT 1 kit by Does not apply route once. One touch delica lancets, and meter Pt will test once daily. Dx 250.92 04/25/14   [name] Mons, PA-C  carvedilol (COREG) 6.25 MG tablet Take 1 tablet (6.25 mg total) by mouth 2 (two) times daily with a meal. 09/28/15   Darreld Mclean, MD  clopidogrel (PLAVIX) 75 MG tablet Take 1 tablet (75 mg total) by mouth daily with breakfast. 02/12/15   Larey Dresser, MD  docusate sodium (COLACE) 100 MG capsule Take 1 capsule (100 mg total) by mouth daily. For constipation 04/03/15   Shawnee Knapp, MD  gabapentin (NEURONTIN) 300 MG capsule TAKE ONE CAPSULE BY MOUTH THREE TIMES DAILY AND  2  TABS  AS  NEEDED  AT  NIGHT  FOR  LEG  PAIN 10/11/16   Wendie Agreste, MD  glucose blood (ONE TOUCH ULTRA TEST) test strip Test blood sugar 3 times a day. Dx code: E11.8  10/11/16   Wendie Agreste, MD  Insulin Detemir (LEVEMIR FLEXTOUCH) 100 UNIT/ML Pen Inject 10 Units into the skin daily at 10 pm. 10/11/16   Wendie Agreste, MD  Insulin Pen Needle (NOVOTWIST) 32G X 5 MM MISC USE TWO NEEDLES TO INJECT LANTUS AND VICTOZA ONCE DAILY 10/11/16   Wendie Agreste, MD  isosorbide mononitrate (IMDUR) 30 MG 24 hr tablet Take 1 tablet (30 mg total) by mouth daily. 06/22/15   Liliane Shi,  PA-C  Lancets MISC Use to check blood sugar three times daily. Dx: E11.8 10/11/16   Wendie Agreste, MD  liraglutide (VICTOZA) 18 MG/3ML SOPN INJECT 1.2 MG SUBCUTANEOUSLY ONCE DAILY 10/11/16   Wendie Agreste, MD  lisinopril (PRINIVIL,ZESTRIL) 20 MG tablet Take 1 tablet (20 mg total) by mouth daily. 04/20/15   Liliane Shi, PA-C  nitroGLYCERIN (NITROSTAT) 0.4 MG SL tablet Place 1 tablet (0.4 mg total) under the tongue every 5 (five) minutes as needed for chest pain. 04/08/15   Larey Dresser, MD  pantoprazole (PROTONIX) 40 MG tablet Take 1 tablet (40 mg total) by mouth daily. 06/22/15   Liliane Shi, PA-C  polyethylene glycol powder (GLYCOLAX/MIRALAX) powder Take 17 g by mouth 2 (two) times daily as needed for mild constipation. 04/03/15   Shawnee Knapp, MD  ranitidine (ZANTAC) 150 MG tablet Take 150 mg by mouth 2 (two) times daily.    Historical Provider, MD  ranolazine (RANEXA) 500 MG 12 hr tablet Take 1 tablet (500 mg total) by mouth 2 (two) times daily. 10/14/15   Liliane Shi, PA-C  rosuvastatin (CRESTOR) 40 MG tablet Take 1 tablet (40 mg total) by mouth daily. 10/14/15   Liliane Shi, PA-C   Liver Function Tests  Recent Labs Lab 10/11/16 1157  AST 17  ALT 20  ALKPHOS 173*  BILITOT 0.4  PROT 6.9  ALBUMIN 4.2   No results for input(s): LIPASE, AMYLASE in the last 168 hours. CBC  Recent Labs Lab 10/11/16 1630  WBC 6.1  HGB 15.2  HCT 44.9  MCV 83.6  PLT 379*   Basic Metabolic Panel  Recent Labs Lab 10/11/16 1157 10/11/16 1630  NA 123* 125*  K 5.4* 4.5  CL 86*  85*  CO2 30* 29  GLUCOSE 935* 885*  BUN 21 17  CREATININE 1.13 1.37*  CALCIUM 10.0 9.6     Vitals:   10/11/16 1745 10/11/16 1815 10/11/16 1830 10/11/16 1845  BP: 142/76 148/80 152/78 140/81  Pulse: 64 65 63 63  Resp: '15 13 12 12  '$ Temp:      TempSrc:      SpO2: 97% 96% 96% 97%  Weight:      Height:       Exam: Gen alert, no distress, calm and pleasant Asian male No rash, cyanosis or gangrene Sclera anicteric, throat clear  No jvd or bruits Chest clear bilat RRR no MRG Abd soft ntnd no mass or ascites +bs, no ascites GU normal male MS no joint effusions or deformity Ext no LE or UE edema / no wounds or ulcers Neuro is alert, Ox 3 , nf   Na 125  K 4.5  CO2 29  BUN 17  Cr 1.37  BS 885  CA 9.6 Alb 4.2   LFT's ok  EKG (independ reviewed) > NSR 60's, old IMI, no acute changes UA > negative   Assessment: 1. Hyperglycemia, hyperosmolar nonketotic - plan admit for IV insulin. No sign of acute infection, or cardiac ischemia. Long history of CAD w stents however, bares watching.   2. CAD hx LCX/ RCA stents in 2015 - cont asa, plavix, BB 3. CM EF 40-45% 4. HTN 5. Hx CVA 2011 6. Hx mesenteric art dissection 7. Acute renal insuff, mild -  hold ACEi  Plan - as above     [place] D [facility] Pager [phone]   If 7PM-7AM, please contact night-coverage [email or web address] Password [facility] 10/11/2016, 7:37 PM

## 2016-10-11 NOTE — H&P (Deleted)
[facility] History and Physical  Mar Minchew TIW:[id number] DOB: [birth year] DOA: 10/11/2016  Referring physician: Dr Zenia Resides PCP: Lamar Blinks, MD   Chief Complaint: Uncont diabetes w fatigue  HPI: Barry [name] is a 66 y.o. male from [place] presenting with fatigues, dizziness and high BS's at home.  History obtained through [place] interpreter.  Patient was feeling tired and worn out the last 2 days.  Checked his BS and was 450.  Went to see PCP and BS there was > 500.  Instructed to go to ED and BS here is 900.  Asked to see for admission.   Patient denies any problem getting / taking his medications or insulin.  He lives with his wife and children. He grew up in [place], came to [place] 1984.  Lives in [place].  Quit smoking 4-5 yrs ago.  He worked at [employer] for years then went on disability at the time he had a stroke in 2011.  Had L arm weak from the stroke and didn't walk for a long time, but now he walks small distances. He denies and recent CP, SOB, cough, fevers, chills, n/v/d, joint pain, sore throat.    Old chart 2011 - CVA w L arm weak, HTN, DM2, tobacco > 40+, hx AAA and mesenteric art dissection Jun '15 - NSTEMI > underwent RCA DES for single vessel disease, EF 45% Jun '15 - another CP episode, CTA neg, went for cath again and had 90% LCX w stent placed Jun '15 - abd pain, CT showed stable mesenteric art dissection. Per vasc surg prob chronic mesentertic ischemia. Had AKI Zell CTA of abd was not done.  Pt improved and creat dropped to 1.0, dcd' home.   Sept '16 - unstable angina, rx'd with IV hep, trop neg and EKG nonischemic. Had cath showing patent stents.  dc'd home , medical Rx.   ROS  denies CP  no joint pain   no HA  no blurry vision  no rash  no diarrhea  no nausea/ vomiting  no dysuria  no difficulty voiding  no change in urine color    Past Medical History  Past Medical History:  Diagnosis Date  . CAD (coronary artery disease)    a. LHC (02/26/14):   mLAD 20 (faint L>R collats), mCFX 50, pRCA 95 (plaque rupture) >> PCI:  Xience DES to pRCA (severe tortuosity of R innominate artery >> needs L radial or FA in future);  b. LHC (03/10/14):  CFX 90, oOM 70, pRCA stent patent >> PCI:  Xience DES to mCFX   . Carotid stenosis    a. Carotid US (02/27/14):  Bilateral ICA 1-39%  . Diabetes mellitus   . 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[place])    a. Mesenteric Artery Duplex (7/15):  pSMA chronic dissection with aneurysmal dilation of 1.29 cm (VVS);  b.  Chest CTA (02/26/14):  IMPRESSION:  1. No aortic dissection or other acute abnormality.  2. 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  3. Stable 17 mm ectasia of the left common iliac artery.  4. Atherosclerosis, including aortoiliac and coronary artery disease.   Marland Kitchen History of echocardiogram 09/2015   Echo 1/17: mod LVH, EF 55%, inf-lat HK, Gr 1 DD, mild LAE  . Hx of cardiovascular stress test    Lexiscan Myoview (2/16):  Inferior lateral defect c/w thinning vs small prior infarct, no ischemia, EF 49%, Low Risk  . Hx of echocardiogram    a.  Echocardiogram (02/27/14):  Mod focal basal and mild  concentric LVH, EF 50-55%, no RWMA, Gr 1 DD, mild TR, normal RVF  . Hyperlipidemia   . Hypertension   . Iliac artery aneurysm, left ([place])   . Myocardial infarction   . Stroke [facility])    Past Surgical History  Past Surgical History:  Procedure Laterality Date  . Admission  09/26/2009   CVA.  [name].  Marland Kitchen CARDIAC CATHETERIZATION    . CARDIAC CATHETERIZATION N/A 06/25/2015   Procedure: Left Heart Cath and Coronary Angiography;  Surgeon: Burnell Blanks, MD;  Location: [facility] CV LAB;  Service: Cardiovascular;  Laterality: N/A;  . COLONOSCOPY WITH PROPOFOL N/A 07/23/2015   Procedure: COLONOSCOPY WITH PROPOFOL;  Surgeon: [name], MD;  Location: [facility];  Service: Endoscopy;  Laterality: N/A;  . CORONARY ANGIOPLASTY    . LEFT HEART CATHETERIZATION WITH CORONARY ANGIOGRAM N/A 02/26/2014    Procedure: LEFT HEART CATHETERIZATION WITH CORONARY ANGIOGRAM;  Surgeon: Wellington Hampshire, MD;  Location: [facility] CATH LAB;  Service: Cardiovascular;  Laterality: N/A;  . LEFT HEART CATHETERIZATION WITH CORONARY ANGIOGRAM N/A 03/10/2014   Procedure: LEFT HEART CATHETERIZATION WITH CORONARY ANGIOGRAM;  Surgeon: Jettie Booze, MD;  Location: [facility] CATH LAB;  Service: Cardiovascular;  Laterality: N/A;   Family History  Family History  Problem Relation Age of Onset  . Diabetes Father   . Hypertension Father   . Heart attack Father   . Stroke Neg Hx    Social History  reports that he quit smoking about 7 years ago. He has never used smokeless tobacco. He reports that he does not drink alcohol or use drugs. Allergies No Known Allergies Home medications Prior to Admission medications   Medication Sig Start Date End Date Taking? Authorizing Provider  amLODipine (NORVASC) 5 MG tablet Take 1 tablet (5 mg total) by mouth daily. 04/20/15   Liliane Shi, PA-C  aspirin 81 MG EC tablet Take 1 tablet (81 mg total) by mouth daily. 02/12/15   Larey Dresser, MD  Blood Glucose Monitoring Suppl (ONE TOUCH ULTRA SYSTEM KIT) W/DEVICE KIT 1 kit by Does not apply route once. One touch delica lancets, and meter Pt will test once daily. Dx 250.92 04/25/14   [name] Mons, PA-C  carvedilol (COREG) 6.25 MG tablet Take 1 tablet (6.25 mg total) by mouth 2 (two) times daily with a meal. 09/28/15   Darreld Mclean, MD  clopidogrel (PLAVIX) 75 MG tablet Take 1 tablet (75 mg total) by mouth daily with breakfast. 02/12/15   Larey Dresser, MD  docusate sodium (COLACE) 100 MG capsule Take 1 capsule (100 mg total) by mouth daily. For constipation 04/03/15   Shawnee Knapp, MD  gabapentin (NEURONTIN) 300 MG capsule TAKE ONE CAPSULE BY MOUTH THREE TIMES DAILY AND  2  TABS  AS  NEEDED  AT  NIGHT  FOR  LEG  PAIN 10/11/16   Wendie Agreste, MD  glucose blood (ONE TOUCH ULTRA TEST) test strip Test blood sugar 3 times a day. Dx code: E11.8  10/11/16   Wendie Agreste, MD  Insulin Detemir (LEVEMIR FLEXTOUCH) 100 UNIT/ML Pen Inject 10 Units into the skin daily at 10 pm. 10/11/16   Wendie Agreste, MD  Insulin Pen Needle (NOVOTWIST) 32G X 5 MM MISC USE TWO NEEDLES TO INJECT LANTUS AND VICTOZA ONCE DAILY 10/11/16   Wendie Agreste, MD  isosorbide mononitrate (IMDUR) 30 MG 24 hr tablet Take 1 tablet (30 mg total) by mouth daily. 06/22/15   Liliane Shi,  PA-C  Lancets MISC Use to check blood sugar three times daily. Dx: E11.8 10/11/16   Wendie Agreste, MD  liraglutide (VICTOZA) 18 MG/3ML SOPN INJECT 1.2 MG SUBCUTANEOUSLY ONCE DAILY 10/11/16   Wendie Agreste, MD  lisinopril (PRINIVIL,ZESTRIL) 20 MG tablet Take 1 tablet (20 mg total) by mouth daily. 04/20/15   Liliane Shi, PA-C  nitroGLYCERIN (NITROSTAT) 0.4 MG SL tablet Place 1 tablet (0.4 mg total) under the tongue every 5 (five) minutes as needed for chest pain. 04/08/15   Larey Dresser, MD  pantoprazole (PROTONIX) 40 MG tablet Take 1 tablet (40 mg total) by mouth daily. 06/22/15   Liliane Shi, PA-C  polyethylene glycol powder (GLYCOLAX/MIRALAX) powder Take 17 g by mouth 2 (two) times daily as needed for mild constipation. 04/03/15   Shawnee Knapp, MD  ranitidine (ZANTAC) 150 MG tablet Take 150 mg by mouth 2 (two) times daily.    Historical Provider, MD  ranolazine (RANEXA) 500 MG 12 hr tablet Take 1 tablet (500 mg total) by mouth 2 (two) times daily. 10/14/15   Liliane Shi, PA-C  rosuvastatin (CRESTOR) 40 MG tablet Take 1 tablet (40 mg total) by mouth daily. 10/14/15   Liliane Shi, PA-C   Liver Function Tests  Recent Labs Lab 10/11/16 1157  AST 17  ALT 20  ALKPHOS 173*  BILITOT 0.4  PROT 6.9  ALBUMIN 4.2   No results for input(s): LIPASE, AMYLASE in the last 168 hours. CBC  Recent Labs Lab 10/11/16 1630  WBC 6.1  HGB 15.2  HCT 44.9  MCV 83.6  PLT 952*   Basic Metabolic Panel  Recent Labs Lab 10/11/16 1157 10/11/16 1630  NA 123* 125*  K 5.4* 4.5  CL 86*  85*  CO2 30* 29  GLUCOSE 935* 885*  BUN 21 17  CREATININE 1.13 1.37*  CALCIUM 10.0 9.6     Vitals:   10/11/16 1745 10/11/16 1815 10/11/16 1830 10/11/16 1845  BP: 142/76 148/80 152/78 140/81  Pulse: 64 65 63 63  Resp: _0 Temp:      TempSrc:      SpO2: 97% 96% 96% 97%  Weight:      Height:       Exam: Gen alert, no distress, calm and pleasant Asian male No rash, cyanosis or gangrene Sclera anicteric, throat clear  No jvd or bruits Chest clear bilat RRR no MRG Abd soft ntnd no mass or ascites +bs, no ascites GU normal male MS no joint effusions or deformity Ext no LE or UE edema / no wounds or ulcers Neuro is alert, Ox 3 , nf   Na 125  K 4.5  CO2 29  BUN 17  Cr 1.37  BS 885  CA 9.6 Alb 4.2   LFT's ok  EKG (independ reviewed) > NSR 60's, old IMI, no acute changes UA > negative   Assessment: 1. Hyperglycemia, hyperosmolar nonketotic - plan admit for IV insulin. No sign of acute infection, or cardiac ischemia. Long history of CAD w stents however, bares watching.   2. CAD hx LCX/ RCA stents in 2015 - cont asa, plavix, BB 3. CM EF 40-45% 4. HTN 5. Hx CVA 2011 6. Hx mesenteric art dissection 7. Acute renal insuff, mild -  hold ACEi  Plan - as above     [place] [place] [facility] Pager [phone]   If 7PM-7AM, please contact night-coverage [email or web address] Password [email or web address] 10/11/2016, 7:12 PM

## 2016-10-11 NOTE — ED Provider Notes (Signed)
[facility] DEPT Provider Note   CSN: [id number] Arrival date & time: 10/11/16  1616     History   Chief Complaint Chief Complaint  Patient presents with  . Hyperglycemia    HPI Barry [name] is a 66 y.o. male.  66 year old male with history of insulin-dependent diabetes presented with hyperglycemia from his doctor's office. Blood sugar at the office was over 900. He endorses polyuria as well as polydipsia. Has been noncompliant with his diabetic medications. Denies any fever or chills. No diarrhea noted. No chest or abdominal discomfort. Denies any weakness. No syncope or near-syncope. No treatment use prior to arrival.      Past Medical History:  Diagnosis Date  . CAD (coronary artery disease)    a. LHC (02/26/14):  mLAD 20 (faint L>R collats), mCFX 50, pRCA 95 (plaque rupture) >> PCI:  Xience DES to pRCA (severe tortuosity of R innominate artery >> needs L radial or FA in future);  b. LHC (03/10/14):  CFX 90, oOM 70, pRCA stent patent >> PCI:  Xience DES to mCFX   . Carotid stenosis    a. Carotid US (02/27/14):  Bilateral ICA 1-39%  . Diabetes mellitus   . 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[place])    a. Mesenteric Artery Duplex (7/15):  pSMA chronic dissection with aneurysmal dilation of 1.29 cm (VVS);  b.  Chest CTA (02/26/14):  IMPRESSION:  1. No aortic dissection or other acute abnormality.  2. 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  3. Stable 17 mm ectasia of the left common iliac artery.  4. Atherosclerosis, including aortoiliac and coronary artery disease.   Marland Kitchen History of echocardiogram 09/2015   Echo 1/17: mod LVH, EF 55%, inf-lat HK, Gr 1 DD, mild LAE  . Hx of cardiovascular stress test    Lexiscan Myoview (2/16):  Inferior lateral defect c/w thinning vs small prior infarct, no ischemia, EF 49%, Low Risk  . Hx of echocardiogram    a.  Echocardiogram (02/27/14):  Mod focal basal and mild concentric LVH, EF 50-55%, no RWMA, Gr 1 DD, mild TR, normal RVF  . Hyperlipidemia    . Hypertension   . Iliac artery aneurysm, left ([place])   . Myocardial infarction   . Stroke [facility])     Patient Active Problem List   Diagnosis Date Noted  . Chest pain 06/25/2015  . Pain in the chest   . Atherosclerotic peripheral vascular disease with ulceration ([place]) 04/03/2015  . Special screening for malignant neoplasms, colon 05/07/2014  . CAD (coronary artery disease), native coronary artery 03/28/2014  . Aneurysm of iliac artery ([place]) 03/18/2014  . Abdominal pain, unspecified site 03/18/2014  . Syncope in setting of nausea and vomiting 03/07/14 03/08/2014  . Nausea and vomiting 03/07/14 03/08/2014  . Chest pain with moderate risk of acute coronary syndrome 03/07/2014  . CAD- RCA BMS 02/27/14 02/28/2014  . Non compliance with medical treatment 02/28/2014  . Dyslipidemia, goal LDL below 70 02/28/2014  . Bradycardia- beta blocker decreased 02/28/2014  . Acute on chronic diastolic CHF (congestive heart failure), NYHA class 3 - due to NSTEMI 02/28/2014  . Diastolic CHF ([place]) 08/65/7846  . Type 2 diabetes mellitus with manifestations ([place]) 02/27/2014  . TIA (transient ischemic attack) 02/26/2014  . NSTEMI- 02/27/14 02/26/2014  . Pain in limb-Abdomen  12/06/2013  . Weakness-Left arm / Lef 12/06/2013  . Mesenteric artery stenosis ([place]) 12/06/2013  . 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[place]) 12/03/2012  . Hypertension 11/10/2011  . CVA (cerebral vascular accident) ([place]) 11/10/2011  .  Language barrier 11/10/2011    Past Surgical History:  Procedure Laterality Date  . Admission  09/26/2009   CVA.  [name].  Marland Kitchen CARDIAC CATHETERIZATION    . CARDIAC CATHETERIZATION N/A 06/25/2015   Procedure: Left Heart Cath and Coronary Angiography;  Surgeon: Burnell Blanks, MD;  Location: [facility] CV LAB;  Service: Cardiovascular;  Laterality: N/A;  . COLONOSCOPY WITH PROPOFOL N/A 07/23/2015   Procedure: COLONOSCOPY WITH PROPOFOL;  Surgeon: [name], MD;  [facility]: [facility] ENDOS[facility];   Service: Endoscopy;  Laterality: N/A;  . CORONARY ANGIOPLASTY    . LEFT HEART CATHETERIZATION WITH CORONARY ANGIOGRAM N/A 02/26/2014   Procedure: LEFT HEART CATHETERIZATION WITH CORONARY ANGIOGRAM;  Surgeon: Wellington Hampshire, MD;  Location: [facility] CATH LAB;  Service: Cardiovascular;  Laterality: N/A;  . LEFT HEART CATHETERIZATION WITH CORONARY ANGIOGRAM N/A 03/10/2014   Procedure: LEFT HEART CATHETERIZATION WITH CORONARY ANGIOGRAM;  Surgeon: Jettie Booze, MD;  Location: [facility] CATH LAB;  Service: Cardiovascular;  Laterality: N/A;       Home Medications    Prior to Admission medications   Medication Sig Start Date End Date Taking? Authorizing Provider  amLODipine (NORVASC) 5 MG tablet Take 1 tablet (5 mg total) by mouth daily. 04/20/15   Liliane Shi, PA-C  aspirin 81 MG EC tablet Take 1 tablet (81 mg total) by mouth daily. 02/12/15   Larey Dresser, MD  Blood Glucose Monitoring Suppl (ONE TOUCH ULTRA SYSTEM KIT) W/DEVICE KIT 1 kit by Does not apply route once. One touch delica lancets, and meter Pt will test once daily. Dx 250.92 04/25/14   [name] Mons, PA-C  carvedilol (COREG) 6.25 MG tablet Take 1 tablet (6.25 mg total) by mouth 2 (two) times daily with a meal. 09/28/15   Darreld Mclean, MD  clopidogrel (PLAVIX) 75 MG tablet Take 1 tablet (75 mg total) by mouth daily with breakfast. 02/12/15   Larey Dresser, MD  docusate sodium (COLACE) 100 MG capsule Take 1 capsule (100 mg total) by mouth daily. For constipation 04/03/15   Shawnee Knapp, MD  gabapentin (NEURONTIN) 300 MG capsule TAKE ONE CAPSULE BY MOUTH THREE TIMES DAILY AND  2  TABS  AS  NEEDED  AT  NIGHT  FOR  LEG  PAIN 10/11/16   Wendie Agreste, MD  glucose blood (ONE TOUCH ULTRA TEST) test strip Test blood sugar 3 times a day. Dx code: E11.8 10/11/16   Wendie Agreste, MD  Insulin Detemir (LEVEMIR FLEXTOUCH) 100 UNIT/ML Pen Inject 10 Units into the skin daily at 10 pm. 10/11/16   Wendie Agreste, MD  Insulin Pen Needle (NOVOTWIST) 32G X  5 MM MISC USE TWO NEEDLES TO INJECT LANTUS AND VICTOZA ONCE DAILY 10/11/16   Wendie Agreste, MD  isosorbide mononitrate (IMDUR) 30 MG 24 hr tablet Take 1 tablet (30 mg total) by mouth daily. 06/22/15   Liliane Shi, PA-C  Lancets MISC Use to check blood sugar three times daily. Dx: E11.8 10/11/16   Wendie Agreste, MD  liraglutide (VICTOZA) 18 MG/3ML SOPN INJECT 1.2 MG SUBCUTANEOUSLY ONCE DAILY 10/11/16   Wendie Agreste, MD  lisinopril (PRINIVIL,ZESTRIL) 20 MG tablet Take 1 tablet (20 mg total) by mouth daily. 04/20/15   Liliane Shi, PA-C  nitroGLYCERIN (NITROSTAT) 0.4 MG SL tablet Place 1 tablet (0.4 mg total) under the tongue every 5 (five) minutes as needed for chest pain. 04/08/15   Larey Dresser, MD  pantoprazole (PROTONIX) 40 MG tablet  Take 1 tablet (40 mg total) by mouth daily. 06/22/15   Liliane Shi, PA-C  polyethylene glycol powder (GLYCOLAX/MIRALAX) powder Take 17 g by mouth 2 (two) times daily as needed for mild constipation. 04/03/15   Shawnee Knapp, MD  ranitidine (ZANTAC) 150 MG tablet Take 150 mg by mouth 2 (two) times daily.    Historical Provider, MD  ranolazine (RANEXA) 500 MG 12 hr tablet Take 1 tablet (500 mg total) by mouth 2 (two) times daily. 10/14/15   Liliane Shi, PA-C  rosuvastatin (CRESTOR) 40 MG tablet Take 1 tablet (40 mg total) by mouth daily. 10/14/15   Liliane Shi, PA-C    Family History Family History  Problem Relation Age of Onset  . Diabetes Father   . Hypertension Father   . Heart attack Father   . Stroke Neg Hx     Social History Social History  Substance Use Topics  . Smoking status: Former Smoker    Quit date: 06/09/2009  . Smokeless tobacco: Never Used  . Alcohol use No     Allergies   Patient has no known allergies.   Review of Systems Review of Systems  All other systems reviewed and are negative.    Physical Exam Updated Vital Signs BP 138/88 (BP Location: Right Arm)   Pulse 71   Temp 98.4 F (36.9 C) (Oral)   Resp 18    Ht '5\' 6"'$  (1.676 m)   Wt 70.3 kg   SpO2 98%   BMI 25.02 kg/m   Physical Exam  Constitutional: He is oriented to person, place, and time. He appears well-developed and well-nourished.  Non-toxic appearance. No distress.  HENT:  Head: Normocephalic and atraumatic.  Eyes: Conjunctivae, EOM and lids are normal. Pupils are equal, round, and reactive to light.  Neck: Normal range of motion. Neck supple. No tracheal deviation present. No thyroid mass present.  Cardiovascular: Normal rate, regular rhythm and normal heart sounds.  Exam reveals no gallop.   No murmur heard. Pulmonary/Chest: Effort normal and breath sounds normal. No stridor. No respiratory distress. He has no decreased breath sounds. He has no wheezes. He has no rhonchi. He has no rales.  Abdominal: Soft. Normal appearance and bowel sounds are normal. He exhibits no distension. There is no tenderness. There is no rebound and no CVA tenderness.  Musculoskeletal: Normal range of motion. He exhibits no edema or tenderness.  Neurological: He is alert and oriented to person, place, and time. He has normal strength. No cranial nerve deficit or sensory deficit. GCS eye subscore is 4. GCS verbal subscore is 5. GCS motor subscore is 6.  Skin: Skin is warm and dry. No abrasion and no rash noted.  Psychiatric: He has a normal mood and affect. His speech is normal and behavior is normal.  Nursing note and vitals reviewed.    ED Treatments / Results  Labs (all labs ordered are listed, but only abnormal results are displayed) Labs Reviewed  CBC - Abnormal; Notable for the following:       Result Value   Platelets 149 (*)    All other components within normal limits  CBG MONITORING, ED - Abnormal; Notable for the following:    Glucose-Capillary >600 (*)    All other components within normal limits  BASIC METABOLIC PANEL  URINALYSIS, ROUTINE W REFLEX MICROSCOPIC  BLOOD GAS, VENOUS    EKG  EKG Interpretation None        Radiology No results found.  Procedures Procedures (  including critical care time)  Medications Ordered in ED Medications  sodium chloride 0.9 % bolus 1,000 mL (not administered)  0.9 %  sodium chloride infusion (not administered)     Initial Impression / Assessment and Plan / ED Course  I have reviewed the triage vital signs and the nursing notes.  Pertinent labs & imaging results that were available during my care of the patient were reviewed by me and considered in my medical decision making (see chart for details).  Clinical Course     She given IV fluids and started on insulin drip. Will be admitted to telemetry  Final Clinical Impressions(s) / ED Diagnoses   Final diagnoses:  None    New Prescriptions New Prescriptions   No medications on file     Lacretia Leigh, MD 10/11/16 1836

## 2016-10-11 NOTE — Progress Notes (Signed)
[facility] called with critical Glucose 935 Dr. Carlota Raspberry aware

## 2016-10-11 NOTE — ED Notes (Addendum)
Barry Shepherd, MD about recent CBGs and question about contin. insulin. See new orders.

## 2016-10-11 NOTE — ED Notes (Addendum)
ESA [phone] daughter

## 2016-10-11 NOTE — ED Notes (Signed)
Verified with Erline Levine RN

## 2016-10-11 NOTE — Progress Notes (Addendum)
By signing my name below, I, Mesha Guinyard, attest that this documentation has been prepared under the direction and in the presence of Merri Ray, MD.  Electronically Signed: Verlee Monte, Medical Scribe. 10/11/16. 11:24 AM.  Subjective:    Patient ID: Barry [name], male    DOB: August 22, 1951, 66 y.o.   MRN: [id number]  HPI Chief Complaint  Patient presents with  . Hyperglycemia    Pt states that he has been out of insulin for 6 months. BS 480 stated by patient    HPI Comments: Barry [name] is a 66 y.o. male with a PMHx of DM who presents to the [facility] complaining of hyperglycemia and medication refill. Pt's native language isn't English [place] he brought in someone to translate. Last office visit here was 10/07/2015 for chest pain, but 09/28/2015 for medication refill. At that time he had not been seen since 03/2014 he does have a complicated hx including coronary disease, acute and chronic CHF due to NSTEMI. Hx of TIA, CVA, dissection of mesenteric artery in 02/2014. The mesenteria artery dissection was evaluated 10/2015 by CT scan Dr. Trula Slade with vascular and vein specialist. Plan for repeat CT scan in 2 years.  T2DM: Advised to return for PNA, flu shot, and complete physical within a few months at his last visit. At that visit he was on levemir 10 units QD and liraglutide 1.2 mg QD. He was on ASA, as well as lisinopril. His blood sugar has been in the 400s-500s at home. Pt has been drinking a lot of water for 0.5 months and has been out of insulin for 4-5 months as well as neurontin for the burning and tingling in his feet. Reports occasional dizziness, cough, and bitter taste in his mouth. He has not had any symptomatic lows while on blood sugar medication. Denies hospitalization for his blood sugar since the last time he was seen. His health insurance hasn't been active [place] he hasn't been to a doctor. He has not had his flu shot this year. Denies nausea, vomiting, diarrhea,  abdominal pain, chest pain, trouble breathing, new focal weakness. Lab Results  Component Value Date   HGBA1C 7.6 (H) 09/28/2015   HTN: He takes lisinopril 20 mg QD, coreg 6.2 mg BID, amlodipine 5 mg QD, Lab Results  Component Value Date   CREATININE 1.24 09/28/2015   CAD: On plavix 70 mg QD, and ASA 81 mg QD, imdur 30 mg QD in the past. Cardiologist Dr. Aundra Dubin. He was last seen by his cardiologist 09/2015. His NSTEMI was in 2015. Was advised to follow-up 2-3 weeks at that time. He was also started on ranexa 500 mg BID.  HLD: Crestor 40 mg QD. Lab Results  Component Value Date   LIPASE 50 04/03/2015   Lab Results  Component Value Date   ALT 19 07/01/2015   AST 19 07/01/2015   ALKPHOS 77 07/01/2015   BILITOT 0.7 07/01/2015    Patient Active Problem List   Diagnosis Date Noted  . Chest pain 06/25/2015  . Pain in the chest   . Atherosclerotic peripheral vascular disease with ulceration ([place]) 04/03/2015  . Special screening for malignant neoplasms, colon 05/07/2014  . CAD (coronary artery disease), native coronary artery 03/28/2014  . Aneurysm of iliac artery ([place]) 03/18/2014  . Abdominal pain, unspecified site 03/18/2014  . Syncope in setting of nausea and vomiting 03/07/14 03/08/2014  . Nausea and vomiting 03/07/14 03/08/2014  . Chest pain with moderate risk of acute coronary syndrome  03/07/2014  . CAD- RCA BMS 02/27/14 02/28/2014  . Non compliance with medical treatment 02/28/2014  . Dyslipidemia, goal LDL below 70 02/28/2014  . Bradycardia- beta blocker decreased 02/28/2014  . Acute on chronic diastolic CHF (congestive heart failure), NYHA class 3 - due to NSTEMI 02/28/2014  . Diastolic CHF (HCC) 02/27/2014  . Type 2 diabetes mellitus with manifestations (HCC) 02/27/2014  . TIA (transient ischemic attack) 02/26/2014  . NSTEMI- 02/27/14 02/26/2014  . Pain in limb-Abdomen  12/06/2013  . Weakness-Left arm / Lef 12/06/2013  . Mesenteric artery stenosis (HCC) 12/06/2013  .  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HCC) 12/03/2012  . Hypertension 11/10/2011  . CVA (cerebral vascular accident) (HCC) 11/10/2011  . Language barrier 11/10/2011   Past Medical History:  Diagnosis Date  . CAD (coronary artery disease)    a. LHC (02/26/14):  mLAD 20 (faint L>R collats), mCFX 50, pRCA 95 (plaque rupture) >> PCI:  Xience DES to pRCA (severe tortuosity of R innominate artery >> needs L radial or FA in future);  b. LHC (03/10/14):  CFX 90, oOM 70, pRCA stent patent >> PCI:  Xience DES to mCFX   . Carotid stenosis    a. Carotid US (02/27/14):  Bilateral ICA 1-39%  . Diabetes mellitus   . 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HCC)    a. Mesenteric Artery Duplex (7/15):  pSMA chronic dissection with aneurysmal dilation of 1.29 cm (VVS);  b.  Chest CTA (02/26/14):  IMPRESSION:  1. No aortic dissection or other acute abnormality.  2. 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  3. Stable 17 mm ectasia of the left common iliac artery.  4. Atherosclerosis, including aortoiliac and coronary artery disease.   Marland Kitchen History of echocardiogram 09/2015   Echo 1/17: mod LVH, EF 55%, inf-lat HK, Gr 1 DD, mild LAE  . Hx of cardiovascular stress test    Lexiscan Myoview (2/16):  Inferior lateral defect c/w thinning vs small prior infarct, no ischemia, EF 49%, Low Risk  . Hx of echocardiogram    a.  Echocardiogram (02/27/14):  Mod focal basal and mild concentric LVH, EF 50-55%, no RWMA, Gr 1 DD, mild TR, normal RVF  . Hyperlipidemia   . Hypertension   . Iliac artery aneurysm, left (HCC)   . Myocardial infarction   . Stroke [facility])    Past Surgical History:  Procedure Laterality Date  . Admission  09/26/2009   CVA.  [facility].  Marland Kitchen CARDIAC CATHETERIZATION    . CARDIAC CATHETERIZATION N/A 06/25/2015   Procedure: Left Heart Cath and Coronary Angiography;  Surgeon: Kathleene Hazel, MD;  Location: [facility] INVASIVE CV LAB;  Service: Cardiovascular;  Laterality: N/A;  . COLONOSCOPY WITH PROPOFOL N/A 07/23/2015    Procedure: COLONOSCOPY WITH PROPOFOL;  Surgeon: Rachael Fee, MD;  Location: [facility] ENDOSCOPY;  Service: Endoscopy;  Laterality: N/A;  . CORONARY ANGIOPLASTY    . LEFT HEART CATHETERIZATION WITH CORONARY ANGIOGRAM N/A 02/26/2014   Procedure: LEFT HEART CATHETERIZATION WITH CORONARY ANGIOGRAM;  Surgeon: Iran Ouch, MD;  Location: [facility] CATH LAB;  Service: Cardiovascular;  Laterality: N/A;  . LEFT HEART CATHETERIZATION WITH CORONARY ANGIOGRAM N/A 03/10/2014   Procedure: LEFT HEART CATHETERIZATION WITH CORONARY ANGIOGRAM;  Surgeon: Corky Crafts, MD;  Location: [facility] CATH LAB;  Service: Cardiovascular;  Laterality: N/A;   No Known Allergies Prior to Admission medications   Medication Sig Start Date End Date Taking? Authorizing Provider  amLODipine (NORVASC) 5 MG tablet Take 1 tablet (5 mg total) by mouth daily.  04/20/15  Yes Liliane Shi, PA-C  aspirin 81 MG EC tablet Take 1 tablet (81 mg total) by mouth daily. 02/12/15  Yes Larey Dresser, MD  Blood Glucose Monitoring Suppl (ONE TOUCH ULTRA SYSTEM KIT) W/DEVICE KIT 1 kit by Does not apply route once. One touch delica lancets, and meter Pt will test once daily. Dx 250.92 04/25/14  Yes Chelle Jeffery, PA-C  carvedilol (COREG) 6.25 MG tablet Take 1 tablet (6.25 mg total) by mouth 2 (two) times daily with a meal. 09/28/15  Yes Gay Filler Copland, MD  clopidogrel (PLAVIX) 75 MG tablet Take 1 tablet (75 mg total) by mouth daily with breakfast. 02/12/15  Yes Larey Dresser, MD  docusate sodium (COLACE) 100 MG capsule Take 1 capsule (100 mg total) by mouth daily. For constipation 04/03/15  Yes Shawnee Knapp, MD  gabapentin (NEURONTIN) 300 MG capsule TAKE ONE CAPSULE BY MOUTH THREE TIMES DAILY AND  2  TABS  AS  NEEDED  AT  NIGHT  FOR  LEG  PAIN 10/23/15  Yes Jessica C Copland, MD  glucose blood (ONE TOUCH ULTRA TEST) test strip Test blood sugar 3 times a day. Dx code: E11.8 09/28/15  Yes Darreld Mclean, MD  Insulin Detemir (LEVEMIR FLEXTOUCH) 100 UNIT/ML Pen  Inject 10 Units into the skin daily at 10 pm. 09/28/15  Yes Gay Filler Copland, MD  isosorbide mononitrate (IMDUR) 30 MG 24 hr tablet Take 1 tablet (30 mg total) by mouth daily. 06/22/15  Yes Liliane Shi, PA-C  Lancets MISC Use to check blood sugar three times daily. Dx: E11.8 09/30/15  Yes Gay Filler Copland, MD  lisinopril (PRINIVIL,ZESTRIL) 20 MG tablet Take 1 tablet (20 mg total) by mouth daily. 04/20/15  Yes Liliane Shi, PA-C  nitroGLYCERIN (NITROSTAT) 0.4 MG SL tablet Place 1 tablet (0.4 mg total) under the tongue every 5 (five) minutes as needed for chest pain. 04/08/15  Yes Larey Dresser, MD  NOVOTWIST 32G X 5 MM MISC USE TWO NEEDLES TO INJECT LANTUS AND VICTOZA ONCE DAILY 05/25/16  Yes Dorian Heckle English, PA  pantoprazole (PROTONIX) 40 MG tablet Take 1 tablet (40 mg total) by mouth daily. 06/22/15  Yes Liliane Shi, PA-C  polyethylene glycol powder (GLYCOLAX/MIRALAX) powder Take 17 g by mouth 2 (two) times daily as needed for mild constipation. 04/03/15  Yes Shawnee Knapp, MD  ranitidine (ZANTAC) 150 MG tablet Take 150 mg by mouth 2 (two) times daily.   Yes Historical Provider, MD  ranolazine (RANEXA) 500 MG 12 hr tablet Take 1 tablet (500 mg total) by mouth 2 (two) times daily. 10/14/15  Yes Scott Joylene Draft, PA-C  rosuvastatin (CRESTOR) 40 MG tablet Take 1 tablet (40 mg total) by mouth daily. 10/14/15  Yes Liliane Shi, PA-C  VICTOZA 18 MG/3ML SOPN INJECT 1.2 MG SUBCUTANEOUSLY ONCE DAILY 05/25/16  Yes Joretta Bachelor, PA   Social History   Social History  . Marital status: Married    Spouse name: N/A  . Number of children: 3  . Years of education: N/A   Occupational History  . Retired    Social History Main Topics  . Smoking status: Former Smoker    Quit date: 06/09/2009  . Smokeless tobacco: Never Used  . Alcohol use No  . Drug use: No  . Sexual activity: Not Currently   Other Topics Concern  . Not on file   Social History Narrative   Marital: married.  Lives: with  son,wife.       Children: 3 children; 6 grandchildren      Employed: unemployed; disability unknown reason/CVA L sided weakness      Tobacco:  Quit 2012; smoked 40 years.       Alcohol: no drinking now; social in past.       Drugs: none       Exercise: sporadic.       ADLs:  No driving since CVA.   Review of Systems  Respiratory: Positive for cough. Negative for shortness of breath and wheezing.   Cardiovascular: Negative for chest pain.  Gastrointestinal: Negative for abdominal pain, diarrhea, nausea and vomiting.  Endocrine: Positive for polydipsia.  Neurological: Negative for weakness.   Objective:  Physical Exam  Constitutional: He is oriented to person, place, and time. He appears well-developed and well-nourished.  HENT:  Head: Normocephalic and atraumatic.  Eyes: EOM are normal. Pupils are equal, round, and reactive to light.  Neck: No JVD present. Carotid bruit is not present.  Cardiovascular: Normal rate, regular rhythm and normal heart sounds.  Exam reveals no gallop and no friction rub.   No murmur heard. Pulmonary/Chest: Effort normal and breath sounds normal. No respiratory distress. He has no wheezes. He has no rales.  Musculoskeletal: He exhibits no edema.  Neurological: He is alert and oriented to person, place, and time.  Skin: Skin is warm and dry.  Psychiatric: He has a normal mood and affect.  Vitals reviewed.  BP (!) 138/52 (BP Location: Right Arm, Patient Position: Sitting, Cuff Size: Small)   Pulse 73   Temp 97.4 F (36.3 C) (Oral)   Resp 18   Ht 5' 6"  (1.676 m)   Wt 155 lb (70.3 kg)   SpO2 98%   BMI 25.02 kg/m  Assessment & Plan:    Dickson Agostinelli is a 66 y.o. male Type 2 diabetes mellitus with diabetic neuropathy, with long-term current use of insulin (HCC) - Plan: POCT glucose (manual entry), Comprehensive metabolic panel, Microalbumin, urine, POCT glycosylated hemoglobin (Hb A1C), POCT urinalysis dipstick, liraglutide (VICTOZA) 18 MG/3ML SOPN,  gabapentin (NEURONTIN) 300 MG capsule, Insulin Detemir (LEVEMIR FLEXTOUCH) 100 UNIT/ML Pen, glucose blood (ONE TOUCH ULTRA TEST) test strip, Insulin Pen Needle (NOVOTWIST) 32G X 5 MM MISC, Lancets MISC, Ambulatory referral to Endocrinology  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HCC)  Coronary artery disease involving native heart, angina presence unspecified, unspecified vessel or lesion type - Plan: Lipid panel  Current nonadherence to medical treatment - Plan: POCT glucose (manual entry), Comprehensive metabolic panel  Increased thirst - Plan: POCT glucose (manual entry), Comprehensive metabolic panel, Microalbumin, urine, POCT glycosylated hemoglobin (Hb A1C), POCT urinalysis dipstick  Hyperglycemia - Plan: POCT glucose (manual entry), Comprehensive metabolic panel, Microalbumin, urine, POCT glycosylated hemoglobin (Hb A1C), POCT urinalysis dipstick  Hyperlipidemia, unspecified hyperlipidemia type - Plan: Lipid panel  Essential hypertension  Uncontrolled diabetes with history of medication noncompliance and nonadherent to follow-up as recommended previously. Increase thirst, intermittent dizziness, but asymptomatic otherwise in office today. We'll check a stat CMP to determine further treatment today, results to be called to his daughter Einer Meals he was here in office today to help with translation. Refilled Levemir and Victoza at previous doses, testing strips, needles, and his gabapentin for neuropathy at previous doses. Referral to endocrinology was placed, advised to call cardiology for follow-up but also to bring his medications to next visit within 1 week to see if other refills needed prior to cardiology office visit. CMP, lipid panel  pending.    2:39 PM Stat lab call received: Glucose 935. At this level, concerning for hyperosmolar state or other complications, especially with reported episodic dizziness (denied having at time of visit).  Called daughter Esa and advised to have him  evaluated in ER as soon as possible today. Understanding expressed. Advised nurse first at [facility].   Over 40 minutes total time coordinating care with discussion of plan, initial evaluation and follow-up discussions   Meds ordered this encounter  Medications  . liraglutide (VICTOZA) 18 MG/3ML SOPN    Sig: INJECT 1.2 MG SUBCUTANEOUSLY ONCE DAILY    Dispense:  3 mL    Refill:  0  . gabapentin (NEURONTIN) 300 MG capsule    Sig: TAKE ONE CAPSULE BY MOUTH THREE TIMES DAILY AND  2  TABS  AS  NEEDED  AT  NIGHT  FOR  LEG  PAIN    Dispense:  120 capsule    Refill:  0  . Insulin Detemir (LEVEMIR FLEXTOUCH) 100 UNIT/ML Pen    Sig: Inject 10 Units into the skin daily at 10 pm.    Dispense:  9 mL    Refill:  0  . glucose blood (ONE TOUCH ULTRA TEST) test strip    Sig: Test blood sugar 3 times a day. Dx code: E11.8    Dispense:  100 each    Refill:  10  . Insulin Pen Needle (NOVOTWIST) 32G X 5 MM MISC    Sig: USE TWO NEEDLES TO INJECT LANTUS AND VICTOZA ONCE DAILY    Dispense:  100 each    Refill:  0  . Lancets MISC    Sig: Use to check blood sugar three times daily. Dx: E11.8    Dispense:  300 each    Refill:  3    DELICA LANCETS.   Patient Instructions   It is very important that you follow up regularly and stay on medicine as prescribed to lessen risk of complications such as another heart attack, stroke, kidney failure or other complications.   Your sugar level is too high to read today, but will send out labs that will return in a few hours. Depending on that level, I may call you to be seen in emergency room. For now, restart your previous insulin and Victoza dose and return for recheck in next 1 week. If any nausea or vomiting, abdominal pain, dizziness or new/worse symptoms - go to emergency room. I am also referring you to endocrinology for your diabetes, but that appointment will likely be after follow up here in next week.   Call your cardiologist (Dr. Aundra Dubin) for follow up as you  appear to be overdue for follow up.  [address], [place], [place] [address]  Phone: [phone]  Return to the clinic or go to the nearest emergency room if any of your symptoms worsen or new symptoms occur.    Diabetes Mellitus and Standards of Medical Care Managing diabetes (diabetes mellitus) can be complicated. Your diabetes treatment may be managed by a team of health care providers, including:  A diet and nutrition specialist (registered dietitian).  A nurse.  A certified diabetes educator (CDE).  A diabetes specialist (endocrinologist).  An eye doctor.  A primary care provider.  A dentist. Your health care providers follow a schedule in order to help you get the best quality of care. The following schedule is a general guideline for your diabetes management plan. Your health care providers may also give  you more specific instructions. HbA1c ( hemoglobin A1c) test This test provides information about blood sugar (glucose) control over the previous 2-3 months. It is used to check whether your diabetes management plan needs to be adjusted.  If you are meeting your treatment goals, this test is done at least 2 times a year.  If you are not meeting treatment goals or if your treatment goals have changed, this test is done 4 times a year. Blood pressure test  This test is done at every routine medical visit. For most people, the goal is less than 140/90. In some cases, your goal blood pressure may be 130/80 or less. Ask your health care provider what your goal blood pressure should be. Dental and eye exams  Visit your dentist two times a year.  If you have type 1 diabetes, get an eye exam 3-5 years after you are diagnosed, and then once a year after your first exam.  If you were diagnosed with type 1 diabetes as a child, get an eye exam when you are age 40 or older and have had diabetes for 3-5 years. After the first exam, you should get an eye exam once a  year.  If you have type 2 diabetes, have an eye exam as soon as you are diagnosed, and then once a year after your first exam. Foot care exam  Visual foot exams are done at every routine medical visit. The exams check for cuts, bruises, redness, blisters, sores, or other problems with the feet.  A complete foot exam is done by your health care provider once a year. This exam includes an inspection of the structure and skin of your feet, and a check of the pulses and sensation in your feet.  Type 1 diabetes: Get your first exam 3-5 years after diagnosis.  Type 2 diabetes: Get your first exam as soon as you are diagnosed.  Check your feet every day for cuts, bruises, redness, blisters, or sores. If you have any of these or other problems that are not healing, contact your health care provider. Kidney function test ( urine microalbumin)  This test is done once a year.  Type 1 diabetes: Get your first test 5 years after diagnosis.  Type 2 diabetes: Get your first test as soon as you are diagnosed.  If you have chronic kidney disease (CKD), get a serum creatinine and estimated glomerular filtration rate (eGFR) test once a year. Lipid profile (cholesterol, HDL, LDL, triglycerides)  This test should be done when you are diagnosed with diabetes, and every 5 years after the first test. If you are on medicines to lower your cholesterol, you may need to get this test done every year.  The goal for LDL is less than 100 mg/dL (5.5 mmol/L). If you are at high risk, the goal is less than 70 mg/dL (3.9 mmol/L).  The goal for HDL is 40 mg/dL (2.2 mmol/L) for men and 50 mg/dL(2.8 mmol/L) for women. An HDL cholesterol of 60 mg/dL (3.3 mmol/L) or higher gives some protection against heart disease.  The goal for triglycerides is less than 150 mg/dL (8.3 mmol/L). Immunizations  The yearly flu (influenza) vaccine is recommended for everyone 6 months or older who has diabetes.  The pneumonia  (pneumococcal) vaccine is recommended for everyone 2 years or older who has diabetes. If you are 99 or older, you may get the pneumonia vaccine as a series of two separate shots.  The hepatitis B vaccine is recommended for adults  shortly after they have been diagnosed with diabetes.  The Tdap (tetanus, diphtheria, and pertussis) vaccine should be given:  According to normal childhood vaccination schedules, for children.  Every 10 years, for adults who have diabetes.  The shingles vaccine is recommended for people who have had chicken pox and are 50 years or older. Mental and emotional health  Screening for symptoms of eating disorders, anxiety, and depression is recommended at the time of diagnosis and afterward as needed. If your screening shows that you have symptoms (you have a positive screening result), you may need further evaluation and be referred to a mental health care provider. Diabetes self-management education  Education about how to manage your diabetes is recommended at diagnosis and ongoing as needed. Treatment plan  Your treatment plan will be reviewed at every medical visit. Summary  Managing diabetes (diabetes mellitus) can be complicated. Your diabetes treatment may be managed by a team of health care providers.  Your health care providers follow a schedule in order to help you get the best quality of care.  Standards of care including having regular physical exams, blood tests, blood pressure monitoring, immunizations, screening tests, and education about how to manage your diabetes.  Your health care providers may also give you more specific instructions based on your individual health. This information is not intended to replace advice given to you by your health care provider. Make sure you discuss any questions you have with your health care provider. Document Released: 07/10/2009 Document Revised: 06/10/2016 Document Reviewed: 06/10/2016 Elsevier Interactive  Patient Education  2017 [employer].    IF you received an x-ray today, you will receive an invoice from [facility]. Please contact [facility] at [phone] with questions or concerns regarding your invoice.   IF you received labwork today, you will receive an invoice from [facility]. Please contact [facility] at [phone] with questions or concerns regarding your invoice.   Our billing staff will not be able to assist you with questions regarding bills from these companies.  You will be contacted with the lab results as soon as they are available. The fastest way to get your results is to activate your My Chart account. Instructions are located on the last page of this paperwork. If you have not heard from [place] regarding the results in 2 weeks, please contact this office.        I personally performed the services described in this documentation, which was scribed in my presence. The recorded information has been reviewed and considered, and addended by me as needed.   Signed,   Merri Ray, MD [facility].  10/11/16 12:51 PM

## 2016-10-12 DIAGNOSIS — I2510 Atherosclerotic heart disease of native coronary artery without angina pectoris: Secondary | ICD-10-CM

## 2016-10-12 DIAGNOSIS — Z8673 Personal history of transient ischemic attack (TIA), and cerebral infarction without residual deficits: Secondary | ICD-10-CM

## 2016-10-12 DIAGNOSIS — E1101 Type 2 diabetes mellitus with hyperosmolarity with coma: Secondary | ICD-10-CM

## 2016-10-12 DIAGNOSIS — I10 Essential (primary) hypertension: Secondary | ICD-10-CM

## 2016-10-12 LAB — BASIC METABOLIC PANEL
Anion gap: 8 (ref 5–15)
BUN: 15 mg/dL (ref 6–20)
CO2: 30 mmol/L (ref 22–32)
Calcium: 9.3 mg/dL (ref 8.9–10.3)
Chloride: 98 mmol/L — ABNORMAL LOW (ref 101–111)
Creatinine, Ser: 1.13 mg/dL (ref 0.61–1.24)
GFR calc Af Amer: >60 mL/min (ref >60)
GFR calc non Af Amer: >60 mL/min (ref >60)
Glucose, Bld: 174 mg/dL — ABNORMAL HIGH (ref 65–99)
Potassium: 4.5 mmol/L (ref 3.5–5.1)
Sodium: 136 mmol/L (ref 135–145)

## 2016-10-12 LAB — SPECIMEN STATUS

## 2016-10-12 LAB — CBC
HCT: 45.2 % (ref 39.0–52.0)
HCT: 47.9 % (ref 39.0–52.0)
Hemoglobin: 15.3 g/dL (ref 13.0–17.0)
Hemoglobin: 15.9 g/dL (ref 13.0–17.0)
MCH: 28.1 pg (ref 26.0–34.0)
MCH: 28.2 pg (ref 26.0–34.0)
MCHC: 33.2 g/dL (ref 30.0–36.0)
MCHC: 33.8 g/dL (ref 30.0–36.0)
MCV: 83.2 fL (ref 78.0–100.0)
MCV: 84.6 fL (ref 78.0–100.0)
Platelets: 154 10*3/uL (ref 150–400)
Platelets: 160 10*3/uL (ref 150–400)
RBC: 5.43 MIL/uL (ref 4.22–5.81)
RBC: 5.66 MIL/uL (ref 4.22–5.81)
RDW: 12.7 % (ref 11.5–15.5)
RDW: 12.7 % (ref 11.5–15.5)
WBC: 7.6 10*3/uL (ref 4.0–10.5)
WBC: 8.1 10*3/uL (ref 4.0–10.5)

## 2016-10-12 LAB — GLUCOSE, CAPILLARY
Glucose-Capillary: 155 mg/dL — ABNORMAL HIGH (ref 65–99)
Glucose-Capillary: 146 mg/dL — ABNORMAL HIGH (ref 65–99)
Glucose-Capillary: 453 mg/dL — ABNORMAL HIGH (ref 65–99)
Glucose-Capillary: 459 mg/dL — ABNORMAL HIGH (ref 65–99)
Glucose-Capillary: 401 mg/dL — ABNORMAL HIGH (ref 65–99)
Glucose-Capillary: 217 mg/dL — ABNORMAL HIGH (ref 65–99)

## 2016-10-12 LAB — CREATININE, SERUM
Creatinine, Ser: 1.04 mg/dL (ref 0.61–1.24)
GFR calc Af Amer: >60 mL/min (ref >60)
GFR calc non Af Amer: >60 mL/min (ref >60)

## 2016-10-12 LAB — MICROALBUMIN, URINE: Microalbumin, Urine: <3 ug/mL

## 2016-10-12 LAB — CBG MONITORING, ED: Glucose-Capillary: 148 mg/dL — ABNORMAL HIGH (ref 65–99)

## 2016-10-12 MED ORDER — INSULIN ASPART 100 UNIT/ML ~~LOC~~ SOLN
20.0000 [IU] | Freq: Once | SUBCUTANEOUS | Status: AC
  Administered 2016-10-12: 20 [IU] via SUBCUTANEOUS

## 2016-10-12 MED ORDER — INSULIN ASPART 100 UNIT/ML ~~LOC~~ SOLN
0.0000 [IU] | Freq: Three times a day (TID) | SUBCUTANEOUS | Status: DC
  Administered 2016-10-13: 11 [IU] via SUBCUTANEOUS

## 2016-10-12 MED ORDER — INSULIN ASPART 100 UNIT/ML ~~LOC~~ SOLN: 4.0000 [IU] | Freq: Three times a day (TID) | SUBCUTANEOUS | Status: DC

## 2016-10-12 MED ORDER — INSULIN DETEMIR 100 UNIT/ML ~~LOC~~ SOLN
22.0000 [IU] | Freq: Two times a day (BID) | SUBCUTANEOUS | Status: DC
  Administered 2016-10-12 – 2016-10-13 (×2): 22 [IU] via SUBCUTANEOUS
  Filled 2016-10-12 (×3): qty 0.22

## 2016-10-12 MED ORDER — INSULIN DETEMIR 100 UNIT/ML ~~LOC~~ SOLN
15.0000 [IU] | Freq: Two times a day (BID) | SUBCUTANEOUS | Status: DC
  Administered 2016-10-12: 15 [IU] via SUBCUTANEOUS
  Filled 2016-10-12 (×2): qty 0.15

## 2016-10-12 MED ORDER — INSULIN ASPART 100 UNIT/ML ~~LOC~~ SOLN
6.0000 [IU] | Freq: Three times a day (TID) | SUBCUTANEOUS | Status: DC
  Administered 2016-10-13 (×2): 6 [IU] via SUBCUTANEOUS

## 2016-10-12 MED ORDER — INSULIN DETEMIR 100 UNIT/ML ~~LOC~~ SOLN: 12.0000 [IU] | Freq: Every day | SUBCUTANEOUS | Status: DC

## 2016-10-12 NOTE — Progress Notes (Signed)
Inpatient Diabetes Program Recommendations  AACE/ADA: New Consensus Statement on Inpatient Glycemic Control (2015)  Target Ranges:  Prepandial:   less than 140 mg/dL      Peak postprandial:   less than 180 mg/dL (1-2 hours)      Critically ill patients:  140 - 180 mg/dL   Lab Results  Component Value Date   GLUCAP 146 (H) 10/12/2016   HGBA1C 14.0 10/11/2016    Review of Glycemic Control:  Results for CHAYSON, GRALL (MRN [id number]) as of 10/12/2016 09:12  Ref. Range 10/11/2016 16:27 10/11/2016 18:19 10/11/2016 18:53 10/11/2016 20:08 10/11/2016 21:06 10/11/2016 22:14  Glucose-Capillary Latest Ref Range: 65 - 99 mg/dL >600 (HH) >600 (HH) 545 (HH) 224 (H) 196 (H) 147 (H)    Diabetes history: Type 2 diabetes with hyperosmolar nonketotic hyperglycemia Outpatient Diabetes medications: Levemir 10 units q HS, Vicotoza 18 1.2 mg daily- note per MD note patient was not taking for the past 5-6 months Current orders for Inpatient glycemic control:  Transitioned off insulin drip overnight to Levemir 10 units q HS and Novolog sensitive q 4 hours  Inpatient Diabetes Program Recommendations:     Agree with current orders.  Diabetes Coordinator to see patient this morning to find out barriers to patient obtaining insulin and taking medications as prescribed.    Thanks, Adah Perl, RN, BC-ADM Inpatient Diabetes Coordinator Pager [phone] (8a-5p)

## 2016-10-12 NOTE — Progress Notes (Signed)
Spoke with patient and wife in room and daughter on the phone regarding self-management of diabetes.  1.  Taught patient and family the following (teach back and/or return demonstration):  Hypo/Hyperglycemia  Medications at D/C (what these are, why taking, when taking, how taking, common S.E.'s)  CBG monitoring, A1C and why these are important   2.  Identified barriers and facilitators to self-management goals:  Patient ran out of insulin 6 months ago and was afraid to go to MD because he could not afford out of pocket costs.  This RN explained the importance of taking insulin consistently and the effect on patient if insulin doses are missed.  Daughter states that coverage has been worked out [place] that patient should always be able to get insulin from now on.  This RN stated that the patient can always call the PCP with any problems in the future because their are other insulin options available that could be tried if cost does become an issue.  Also, this RN told family and patient to contact the Inpatient DM team if they have any questions prior to discharge.  3.  Identified support systems:  Wife and daughter  Thank you,  Windy Carina, RN, MSN Diabetes Coordinator Inpatient Diabetes Program [phone] (Team Pager)

## 2016-10-12 NOTE — Progress Notes (Signed)
Seen and examined-spouse at bedside, spoke extensively with the patient's daughter over the phone who speaks English very well. Unfortunately for at least 6 months, patient has not been taking his insulin. I suspect uncontrolled hyperglycemia was likely secondary to noncompliance. Patient and his spouse anxiously wanting to go home today-I Will plan on monitoring him for the rest of the day, and if his CBGs continue to be stable, I suspect he can be discharged later today. He has been counseled extensively regarding importance of compliance, and has been asked to follow-up with his PCP in a week or Gill. I have asked him to make sure that he lets his PCP know a week or 2 before he runs out of any medications.  Full note to follow

## 2016-10-12 NOTE — Progress Notes (Signed)
PROGRESS NOTE        PATIENT DETAILS Name: Barry [name] Age: 66 y.o. Sex: male Date of Birth: 13-Jun-1951 Admit Date: 10/11/2016 Admitting Physician Roney Jaffe, MD [id number], MD  Brief Narrative: Patient is a 66 y.o. male with history of type 2 diabetes-noncompliant to insulin for the past 6 months due to financial issues, hypertension, CVA, CAD status post PCI in 2015 presented to the hospital with uncontrolled hyperglycemia. See below for further details  Subjective: Feels much better. Anxious to go home. Wife at bedside who speaks some [place], spoke with daughter over the phone.  Assessment/Plan: Principal Problem: Type 2 diabetes mellitus with hyperosmolar nonketotic hyperglycemia: CBGs better after discontinuation of insulin infusion-but unfortunately has started to increase again. Increase Lantus to 15 units twice a day, add 4 units of NovoLog. Change SSI to moderate scale. Monitor one additional day, if CBGs stable-potential discharge on 1/18  Acute kidney injury: Likely mild prerenal azotemia in a setting of hyperglycemia-resolved  History of CAD status post PCI 2015: No anginal symptoms, continue aspirin, Plavix, statin and beta blocker  History of CVA 2011: Nonfocal exam, continue antiplatelet agents  Hypertension: Controlled, continue Imdur and Coreg-resume ACE inhibitor on discharge   DVT Prophylaxis: Prophylactic Lovenox   Code Status: Full code   Family Communication: Spouse at bedside-and daughter over the phone  Disposition Plan: Remain inpatient-home likely on 1/18  Antimicrobial agents: Anti-infectives    None      Procedures: None  CONSULTS:  None  Time spent: 25- minutes-Greater than 50% of this time was spent in counseling, explanation of diagnosis, planning of further management, and coordination of care.  MEDICATIONS: Scheduled Meds: . carvedilol  6.25 mg Oral BID WC  . clopidogrel  75 mg Oral Q  breakfast  . docusate sodium  100 mg Oral Daily  . enoxaparin (LOVENOX) injection  40 mg Subcutaneous Q24H  . famotidine  20 mg Oral BID  . gabapentin  300 mg Oral TID  . insulin aspart  0-15 Units Subcutaneous TID WC  . insulin aspart  20 Units Subcutaneous Once  . insulin aspart  4 Units Subcutaneous TID WC  . insulin detemir  15 Units Subcutaneous BID  . isosorbide mononitrate  30 mg Oral Daily  . pantoprazole  40 mg Oral Daily  . ranolazine  500 mg Oral BID  . rosuvastatin  40 mg Oral Daily   Continuous Infusions: PRN Meds:.acetaminophen **OR** acetaminophen, ondansetron **OR** ondansetron (ZOFRAN) IV, polyethylene glycol   PHYSICAL EXAM: Vital signs: Vitals:   10/12/16 0015 10/12/16 0130 10/12/16 0421 10/12/16 0500  BP: 125/74 (!) 148/79 111/67   Pulse: (!) 56 66 62   Resp: 13 18 18    Temp:  97.9 F (36.6 C) 98.3 F (36.8 C)   TempSrc:  Oral Oral   SpO2: 98% 96% 99%   Weight:    71.5 kg (157 lb 11.2 oz)  Height:       Filed Weights   10/11/16 1622 10/12/16 0500  Weight: 70.3 kg (155 lb) 71.5 kg (157 lb 11.2 oz)   Body mass index is 25.45 kg/m.   General appearance :Awake, alert, not in any distress. Speech Clear. Not toxic Looking Eyes:, pupils equally reactive to light and accomodation,no scleral icterus.Pink conjunctiva HEENT: Atraumatic and Normocephalic Neck: supple, no JVD. No cervical lymphadenopathy. No thyromegaly Resp:Good air entry bilaterally, no added  sounds  CVS: S1 S2 regular, no murmurs.  GI: Bowel sounds present, Non tender and not distended with no gaurding, rigidity or rebound.No organomegaly Extremities: B/L Lower Ext shows no edema, both legs are warm to touch Neurology:  speech clear,Non focal, sensation is grossly intact. Psychiatric: Normal judgment and insight. Alert and oriented x 3. Normal mood. Musculoskeletal:No digital cyanosis Skin:No Rash, warm and dry Wounds:N/A  I have personally reviewed following labs and imaging  studies  LABORATORY DATA: CBC:  Recent Labs Lab 10/11/16 1156 10/11/16 1630 10/11/16 2354 10/12/16 0302  WBC WILL FOLLOW 6.1 8.1 7.6  NEUTROABS WILL FOLLOW  --   --   --   HGB  --  15.2 15.3 15.9  HCT WILL FOLLOW 44.9 45.2 47.9  MCV WILL FOLLOW 83.6 83.2 84.6  PLT WILL FOLLOW 149* 154 0000000    Basic Metabolic Panel:  Recent Labs Lab 10/11/16 1157 10/11/16 1630 10/11/16 2354 10/12/16 0302  NA 123* 125*  --  136  K 5.4* 4.5  --  4.5  CL 86* 85*  --  98*  CO2 30* 29  --  30  GLUCOSE 935* 885*  --  174*  BUN 21 17  --  15  CREATININE 1.13 1.37* 1.04 1.13  CALCIUM 10.0 9.6  --  9.3    GFR: Estimated Creatinine Clearance (by C-G formula based on SCr of 1.13 mg/dL) Male: 49.6 mL/min Male: 58 mL/min  Liver Function Tests:  Recent Labs Lab 10/11/16 1157  AST 17  ALT 20  ALKPHOS 173*  BILITOT 0.4  PROT 6.9  ALBUMIN 4.2   No results for input(s): LIPASE, AMYLASE in the last 168 hours. No results for input(s): AMMONIA in the last 168 hours.  Coagulation Profile: No results for input(s): INR, PROTIME in the last 168 hours.  Cardiac Enzymes: No results for input(s): CKTOTAL, CKMB, CKMBINDEX, TROPONINI in the last 168 hours.  BNP (last 3 results) No results for input(s): PROBNP in the last 8760 hours.  HbA1C:  Recent Labs  10/11/16 1228  HGBA1C 14.0    CBG:  Recent Labs Lab 10/11/16 2214 10/12/16 0010 10/12/16 0418 10/12/16 0811 10/12/16 1106  GLUCAP 147* 148* 155* 146* 453*    Lipid Profile:  Recent Labs  10/11/16 1156  CHOL 241*  HDL 35*  LDLCALC 157*  TRIG 243*  CHOLHDL 6.9*    Thyroid Function Tests: No results for input(s): TSH, T4TOTAL, FREET4, T3FREE, THYROIDAB in the last 72 hours.  Anemia Panel: No results for input(s): VITAMINB12, FOLATE, FERRITIN, TIBC, IRON, RETICCTPCT in the last 72 hours.  Urine analysis:    Component Value Date/Time   COLORURINE COLORLESS (A) 10/11/2016 1900   APPEARANCEUR CLEAR 10/11/2016  1900   LABSPEC 1.022 10/11/2016 1900   PHURINE 8.0 10/11/2016 1900   GLUCOSEU >=500 (A) 10/11/2016 1900   HGBUR NEGATIVE 10/11/2016 1900   BILIRUBINUR NEGATIVE 10/11/2016 1900   BILIRUBINUR negative 10/11/2016 1227   BILIRUBINUR neg 07/07/2014 1908   KETONESUR NEGATIVE 10/11/2016 1900   PROTEINUR NEGATIVE 10/11/2016 1900   UROBILINOGEN 0.2 10/11/2016 1227   UROBILINOGEN 0.2 01/12/2015 1121   NITRITE NEGATIVE 10/11/2016 1900   LEUKOCYTESUR NEGATIVE 10/11/2016 1900    Sepsis Labs: Lactic Acid, Venous    Component Value Date/Time   LATICACIDVEN 1.10 05/18/2015 1237    MICROBIOLOGY: No results found for this or any previous visit (from the past 240 hour(s)).  RADIOLOGY STUDIES/RESULTS: No results found.   LOS: 1 day   Oren Binet, MD  [facility]  Pager:[phone]  If 7PM-7AM, please contact night-coverage [email or web address] Password [facility] 10/12/2016, 11:27 AM

## 2016-10-13 LAB — GLUCOSE, CAPILLARY
Glucose-Capillary: 113 mg/dL — ABNORMAL HIGH (ref 65–99)
Glucose-Capillary: 98 mg/dL (ref 65–99)
Glucose-Capillary: 302 mg/dL — ABNORMAL HIGH (ref 65–99)

## 2016-10-13 MED ORDER — INSULIN PEN NEEDLE 32G X 8 MM MISC: 0 refills | Status: DC

## 2016-10-13 MED ORDER — INSULIN DETEMIR 100 UNIT/ML FLEXPEN: 20.0000 [IU] | PEN_INJECTOR | Freq: Two times a day (BID) | SUBCUTANEOUS | 0 refills | Status: DC

## 2016-10-13 MED ORDER — FREESTYLE SYSTEM KIT: 1.0000 | PACK | Freq: Three times a day (TID) | 1 refills | Status: DC

## 2016-10-13 MED ORDER — METFORMIN HCL 500 MG PO TABS
500.0000 mg | ORAL_TABLET | Freq: Two times a day (BID) | ORAL | Status: DC
  Administered 2016-10-13: 500 mg via ORAL
  Filled 2016-10-13: qty 1

## 2016-10-13 MED ORDER — METFORMIN HCL 500 MG PO TABS: 500.0000 mg | ORAL_TABLET | Freq: Two times a day (BID) | ORAL | 0 refills | Status: DC

## 2016-10-13 NOTE — Discharge Summary (Signed)
PATIENT DETAILS Name: Barry [name] Age: 66 y.o. Sex: male Date of Birth: 24-Jun-1951 MRN: [id number]. Admitting Physician: Roney Jaffe, MD MBW:[id number], MD  Admit Date: 10/11/2016 Discharge date: 10/13/2016  Recommendations for Outpatient Follow-up:  1. Follow up with PCP in 1-2 weeks 2. Please obtain BMP/CBC in one week 3. Repeat A1C in 3 months 4. Emphasize need for compliance to medications and diet.   Admitted From:  Home  Disposition: Pink: No  Equipment/Devices: None  Discharge Condition: Stable  CODE STATUS: FULL CODE  Diet recommendation:  Heart Healthy / Carb Modified   Brief Summary: See H&P, Labs, Consult and Test reports for all details in brief, Patient is a 66 y.o. male with history of type 2 diabetes-noncompliant to insulin for the past 6 months due to financial issues, hypertension, CVA, CAD status post PCI in 2015 presented to the hospital with uncontrolled hyperglycemia. See below for further details  Brief Hospital Course: Type 2 diabetes mellitus with hyperosmolar nonketotic hyperglycemia: Managed initially with Insulin infusion-once CBGs were in range-transitioned to SQ Levemir and SSI. CBG's better-but still fluctuating-per RN staff-family noted to be bringing food that was not consistent with a diabetic diet. Levemir has been increased to 20 units BID, have added Metformin. Have asked patient to keep a CBG log and take it to his PCP's office in 1 week. Symptoms of hypoglycemia was discussed-and interventions needed if it does occur was reinforced today. [place] for discharge-further optimization of CBG to be done in the outpatient setting.   Acute kidney injury: Likely mild prerenal azotemia in a setting of hyperglycemia-resolved  History of CAD status post PCI 2015: No anginal symptoms, continue aspirin, Plavix, statin and beta blocker  History of CVA 2011: Nonfocal exam, continue antiplatelet agents  Hypertension:  Controlled, continue Imdur and Coreg-resume ACE inhibitor on discharge  Procedures/Studies: None  Discharge Diagnoses:  Principal Problem:   Type 2 diabetes mellitus with hyperosmolar nonketotic hyperglycemia (HCC) Active Problems:   Hypertension   History of CVA in adulthood   Language barrier   CAD- RCA and LCX stents 2015   Discharge Instructions:  Activity:  As tolerated with Full fall precautions use walker/cane & assistance as needed  Discharge Instructions    Diet - low sodium heart healthy    Complete by:  As directed    Diet Carb Modified    Complete by:  As directed    Discharge instructions    Complete by:  As directed    Follow with Primary MD  COPLAND,JESSICA, MD in 1 week  Please check your CBG's multiple times a day-and keep a log. Please take this log to your PCP's office.   Please get a complete blood count and chemistry panel checked by your Primary MD at your next visit, and again as instructed by your Primary MD.  Get Medicines reviewed and adjusted: Please take all your medications with you for your next visit with your Primary MD  Laboratory/radiological data: Please request your Primary MD to go over all hospital tests and procedure/radiological results at the follow up, please ask your Primary MD to get all Hospital records sent to his/her office.  In some cases, they will be blood work, cultures and biopsy results pending at the time of your discharge. Please request that your primary care M.D. follows up on these results.  Also Note the following: If you experience worsening of your admission symptoms, develop shortness of breath, life threatening emergency, suicidal or homicidal thoughts you  must seek medical attention immediately by calling 911 or calling your MD immediately  if symptoms less severe.  You must read complete instructions/literature along with all the possible adverse reactions/side effects for all the Medicines you take and that  have been prescribed to you. Take any new Medicines after you have completely understood and accpet all the possible adverse reactions/side effects.   Do not drive when taking Pain medications or sleeping medications (Benzodaizepines)  Do not take more than prescribed Pain, Sleep and Anxiety Medications. It is not advisable to combine anxiety,sleep and pain medications without talking with your primary care practitioner  Special Instructions: If you have smoked or chewed Tobacco  in the last 2 yrs please stop smoking, stop any regular Alcohol  and or any Recreational drug use.  Wear Seat belts while driving.  Please note: You were cared for by a hospitalist during your hospital stay. Once you are discharged, your primary care physician will handle any further medical issues. Please note that NO REFILLS for any discharge medications will be authorized once you are discharged, as it is imperative that you return to your primary care physician (or establish a relationship with a primary care physician if you do not have one) for your post hospital discharge needs Beth that they can reassess your need for medications and monitor your lab values.   Increase activity slowly    Complete by:  As directed      Allergies as of 10/13/2016   No Known Allergies     Medication List    STOP taking these medications   liraglutide 18 MG/3ML Sopn Commonly known as:  VICTOZA     TAKE these medications   amLODipine 5 MG tablet Commonly known as:  NORVASC Take 1 tablet (5 mg total) by mouth daily.   aspirin 81 MG EC tablet Take 81 mg by mouth daily as needed.   carvedilol 6.25 MG tablet Commonly known as:  COREG Take 1 tablet (6.25 mg total) by mouth 2 (two) times daily with a meal.   clopidogrel 75 MG tablet Commonly known as:  PLAVIX Take 1 tablet (75 mg total) by mouth daily with breakfast.   docusate sodium 100 MG capsule Commonly known as:  COLACE Take 1 capsule (100 mg total) by mouth  daily. For constipation   gabapentin 300 MG capsule Commonly known as:  NEURONTIN TAKE ONE CAPSULE BY MOUTH THREE TIMES DAILY AND  2  TABS  AS  NEEDED  AT  NIGHT  FOR  LEG  PAIN   glucose blood test strip Commonly known as:  ONE TOUCH ULTRA TEST Test blood sugar 3 times a day. Dx code: E11.8   Insulin Detemir 100 UNIT/ML Pen Commonly known as:  LEVEMIR FLEXTOUCH Inject 20 Units into the skin 2 (two) times daily. What changed:  how much to take  when to take this   Insulin Pen Needle 32G X 8 MM Misc Use as directed What changed:  medication strength  additional instructions   isosorbide mononitrate 30 MG 24 hr tablet Commonly known as:  IMDUR Take 1 tablet (30 mg total) by mouth daily.   Lancets Misc Use to check blood sugar three times daily. Dx: E11.8   lisinopril 20 MG tablet Commonly known as:  PRINIVIL,ZESTRIL Take 1 tablet (20 mg total) by mouth daily.   metFORMIN 500 MG tablet Commonly known as:  GLUCOPHAGE Take 1 tablet (500 mg total) by mouth 2 (two) times daily with a meal.  nitroGLYCERIN 0.4 MG SL tablet Commonly known as:  NITROSTAT Place 1 tablet (0.4 mg total) under the tongue every 5 (five) minutes as needed for chest pain.   ONE TOUCH ULTRA SYSTEM KIT w/Device Kit 1 kit by Does not apply route once. One touch delica lancets, and meter Pt will test once daily. Dx 250.92 What changed:  Another medication with the same name was added. Make sure you understand how and when to take each.   glucose monitoring kit monitoring kit 1 each by Does not apply route 4 (four) times daily - after meals and at bedtime. 1 month Diabetic Testing Supplies for QAC-QHS accuchecks. What changed:  You were already taking a medication with the same name, and this prescription was added. Make sure you understand how and when to take each.   pantoprazole 40 MG tablet Commonly known as:  PROTONIX Take 1 tablet (40 mg total) by mouth daily.   polyethylene glycol powder  powder Commonly known as:  GLYCOLAX/MIRALAX Take 17 g by mouth 2 (two) times daily as needed for mild constipation.   ranitidine 150 MG tablet Commonly known as:  ZANTAC Take 150 mg by mouth 2 (two) times daily.   ranolazine 500 MG 12 hr tablet Commonly known as:  RANEXA Take 1 tablet (500 mg total) by mouth 2 (two) times daily.   rosuvastatin 40 MG tablet Commonly known as:  CRESTOR Take 1 tablet (40 mg total) by mouth daily.      Follow-up Information    COPLAND,JESSICA, MD. Schedule an appointment as soon as possible for a visit in 1 week(s).   Specialty:  Family Medicine Contact information: [address] [phone]          No Known Allergies  Consultations:   None  Other Procedures/Studies: No results found.   TODAY-DAY OF DISCHARGE:  Subjective:   Jaleil Miyoshi today has no headache,no chest abdominal pain,no new weakness tingling or numbness, feels much better wants to go home today.   Objective:   Blood pressure 116/64, pulse 68, temperature 98.6 F (37 C), temperature source Oral, resp. rate 20, height 5' 6"  (1.676 m), weight 72.1 kg (158 lb 14.4 oz), SpO2 96 %.  Intake/Output Summary (Last 24 hours) at 10/13/16 0804 Last data filed at 10/13/16 0600  Gross per 24 hour  Intake              600 ml  Output                0 ml  Net              600 ml   Filed Weights   10/11/16 1622 10/12/16 0500 10/13/16 0357  Weight: 70.3 kg (155 lb) 71.5 kg (157 lb 11.2 oz) 72.1 kg (158 lb 14.4 oz)    Exam: Awake Alert, Oriented *3, No new F.N deficits, Normal affect .AT,PERRAL Supple Neck,No JVD, No cervical lymphadenopathy appriciated.  Symmetrical Chest wall movement, Good air movement bilaterally, CTAB RRR,No Gallops,Rubs or new Murmurs, No Parasternal Heave +ve B.Sounds, Abd Soft, Non tender, No organomegaly appriciated, No rebound -guarding or rigidity. No Cyanosis, Clubbing or edema, No new Rash or  bruise   PERTINENT RADIOLOGIC STUDIES: No results found.   PERTINENT LAB RESULTS: CBC:  Recent Labs  10/11/16 2354 10/12/16 0302  WBC 8.1 7.6  HGB 15.3 15.9  HCT 45.2 47.9  PLT 154 160   CMET CMP     Component Value  Date/Time   NA 136 10/12/2016 0302   NA 123 (L) 10/11/2016 1157   K 4.5 10/12/2016 0302   CL 98 (L) 10/12/2016 0302   CO2 30 10/12/2016 0302   GLUCOSE 174 (H) 10/12/2016 0302   BUN 15 10/12/2016 0302   BUN 21 10/11/2016 1157   CREATININE 1.13 10/12/2016 0302   CREATININE 1.24 09/28/2015 1240   CALCIUM 9.3 10/12/2016 0302   PROT 6.9 10/11/2016 1157   ALBUMIN 4.2 10/11/2016 1157   AST 17 10/11/2016 1157   ALT 20 10/11/2016 1157   ALKPHOS 173 (H) 10/11/2016 1157   BILITOT 0.4 10/11/2016 1157   GFRNONAA >60 10/12/2016 0302   GFRAA >60 10/12/2016 0302    GFR Estimated Creatinine Clearance (by C-G formula based on SCr of 1.13 mg/dL) Male: 49.8 mL/min Male: 58 mL/min No results for input(s): LIPASE, AMYLASE in the last 72 hours. No results for input(s): CKTOTAL, CKMB, CKMBINDEX, TROPONINI in the last 72 hours. Invalid input(s): POCBNP No results for input(s): DDIMER in the last 72 hours.  Recent Labs  10/11/16 1228  HGBA1C 14.0    Recent Labs  10/11/16 1156  CHOL 241*  HDL 35*  LDLCALC 157*  TRIG 243*  CHOLHDL 6.9*   No results for input(s): TSH, T4TOTAL, T3FREE, THYROIDAB in the last 72 hours.  Invalid input(s): FREET3 No results for input(s): VITAMINB12, FOLATE, FERRITIN, TIBC, IRON, RETICCTPCT in the last 72 hours. Coags: No results for input(s): INR in the last 72 hours.  Invalid input(s): PT Microbiology: No results found for this or any previous visit (from the past 240 hour(s)).  FURTHER DISCHARGE INSTRUCTIONS:  Get Medicines reviewed and adjusted: Please take all your medications with you for your next visit with your Primary MD  Laboratory/radiological data: Please request your Primary MD to go over all hospital  tests and procedure/radiological results at the follow up, please ask your Primary MD to get all Hospital records sent to his/her office.  In some cases, they will be blood work, cultures and biopsy results pending at the time of your discharge. Please request that your primary care M.D. goes through all the records of your hospital data and follows up on these results.  Also Note the following: If you experience worsening of your admission symptoms, develop shortness of breath, life threatening emergency, suicidal or homicidal thoughts you must seek medical attention immediately by calling 911 or calling your MD immediately  if symptoms less severe.  You must read complete instructions/literature along with all the possible adverse reactions/side effects for all the Medicines you take and that have been prescribed to you. Take any new Medicines after you have completely understood and accpet all the possible adverse reactions/side effects.   Do not drive when taking Pain medications or sleeping medications (Benzodaizepines)  Do not take more than prescribed Pain, Sleep and Anxiety Medications. It is not advisable to combine anxiety,sleep and pain medications without talking with your primary care practitioner  Special Instructions: If you have smoked or chewed Tobacco  in the last 2 yrs please stop smoking, stop any regular Alcohol  and or any Recreational drug use.  Wear Seat belts while driving.  Please note: You were cared for by a hospitalist during your hospital stay. Once you are discharged, your primary care physician will handle any further medical issues. Please note that NO REFILLS for any discharge medications will be authorized once you are discharged, as it is imperative that you return to your primary care physician (or establish  a relationship with a primary care physician if you do not have one) for your post hospital discharge needs Pertuit that they can reassess your need for  medications and monitor your lab values.  Total Time spent coordinating discharge including counseling, education and face to face time equals 45 minutes.  SignedOren Binet 10/13/2016 8:04 AM

## 2016-10-13 NOTE — Progress Notes (Addendum)
Inpatient Diabetes Program Recommendations  AACE/ADA: New Consensus Statement on Inpatient Glycemic Control (2015)  Target Ranges:  Prepandial:   less than 140 mg/dL      Peak postprandial:   less than 180 mg/dL (1-2 hours)      Critically ill patients:  140 - 180 mg/dL   Lab Results  Component Value Date   GLUCAP 98 10/13/2016   HGBA1C 14.0 10/11/2016    Review of Glycemic ControlResults for Barry, [name] (MRN [id number]) as of 10/13/2016 08:05  Ref. Range 10/12/2016 04:18 10/12/2016 08:11 10/12/2016 11:06 10/12/2016 16:23 10/12/2016 18:08 10/12/2016 21:06 10/13/2016 03:56 10/13/2016 06:06  Glucose-Capillary Latest Ref Range: 65 - 99 mg/dL 155 (H) 146 (H) 453 (H) 459 (H) 401 (H) 217 (H) 113 (H) 98    Diabetes history: Type 2 diabetes Outpatient Diabetes medications: Levemir 10 units q HS, Victoza- patient was not taking Current orders for Inpatient glycemic control:  Levemir 22 units bid, Novolog 6 units tid with meals, Novolog moderate tid with meals  Inpatient Diabetes Program Recommendations:    Note blood sugars increased after transition off insulin drip.  Agree with discharge diabetes medications.  Diabetes Coordinator spoke with patient and family on 1/17.  Will need follow up with PCP as outlined in AVS. Placed referral for outpatient follow-up for education as well-co-sign required.  Thanks, Adah Perl, RN, BC-ADM Inpatient Diabetes Coordinator Pager [phone] (8a-5p)

## 2016-10-13 NOTE — Care Management Note (Signed)
Case Management Note Marvetta Gibbons RN, BSN Unit 2W-Case Manager [phone]  Patient Details  Name: Barry [name] MRN: [id number] Date of Birth: 1951-08-03  Subjective/Objective:  Pt admitted with elevated blood sugars                   Action/Plan: PTA pt lived at home with wife - per Diabetes Coord. Note- wife and daughter state that coverage issues for medications have been resolved and pt has PCP to follow up with.  No CM needs noted for discharge.   Expected Discharge Date:  10/13/16               Expected Discharge Plan:  Home/Self Care  In-House Referral:     Discharge planning Services  CM Consult  Post Acute Care Choice:    Choice offered to:     DME Arranged:    DME Agency:     HH Arranged:    HH Agency:     Status of Service:  Completed, signed off  If discussed at [employer] of Stay Meetings, dates discussed:    Additional Comments:  Dawayne Patricia, RN 10/13/2016, 11:10 AM

## 2016-10-13 NOTE — Progress Notes (Addendum)
Patient in a stable condition, discharge instructions an education completed with patient and wife at bedside,they verbalised understanding,paper prescriptions give to patient,patient belongings at bedside,teledccmmd notified, iv removed, patient taken off the unit on a wheelchair by a NT

## 2016-10-14 ENCOUNTER — Telehealth: Payer: Self-pay

## 2016-10-14 LAB — LIPID PANEL
Chol/HDL Ratio: 6.9 ratio units — ABNORMAL HIGH (ref 0.0–5.0)
Cholesterol, Total: 241 mg/dL — ABNORMAL HIGH (ref 100–199)
HDL: 35 mg/dL — ABNORMAL LOW (ref >39)
LDL Calculated: 157 mg/dL — ABNORMAL HIGH (ref 0–99)
Triglycerides: 243 mg/dL — ABNORMAL HIGH (ref 0–149)
VLDL Cholesterol Cal: 49 mg/dL — ABNORMAL HIGH (ref 5–40)

## 2016-10-14 LAB — SPECIMEN STATUS REPORT

## 2016-10-14 NOTE — Telephone Encounter (Signed)
Received call from patients son. States he is a Administrator and he will have to get someone to call me that is there with patient. Patient speaks limited [place].

## 2016-10-14 NOTE — Telephone Encounter (Signed)
Hospital follow up call made to patient. Left message for return call. 

## 2016-10-14 NOTE — Telephone Encounter (Signed)
10/14/16  Transition Care Management Follow-up Telephone Call  ADMISSION DATE: 10/11/16  DISCHARGE DATE: 10/13/16    How have you been since you were released from the hospital? Patients daughter states he has been doing well just has some weakness.      Do you understand why you were in the hospital? YES, per daughter   Do you understand the discharge instrcutions? Yes,per daughter  Items Reviewed:  Medications reviewed:  Reviewed with daughter  Allergies reviewed: NKMA per daughter  Dietary changes reviewed:Low Sodium Heart Healthy,Carb Modified  Referrals reviewed:  Appointment scheduled with Dr. Lorelei Pont for hospital follow up.    Functional Questionnaire:   Activities of Daily Living (ADLs):  Walks with assistance, Dresses himself, Feeds himself    Any transportation issues/concerns?: No, has transportation when needed.   Any patient concerns?.Daughter concerned that patient his home alone until 9 pm because mother works and she lives out of town. Brother is a Administrator and is on the road a lot.  Needs someone to care for father [place] he will not be alone for such a long period of time.  Confirmed importance and date/time of follow-up visits scheduled: Yes   Confirmed with patient if condition begins to worsen call PCP or go to the ER. Yes    Patient was given the [facility] line [phone]: Yes

## 2016-10-18 ENCOUNTER — Ambulatory Visit (INDEPENDENT_AMBULATORY_CARE_PROVIDER_SITE_OTHER): Payer: Medicare Other | Admitting: Family Medicine

## 2016-10-18 VITALS — BP 130/74 | HR 68 | Temp 98.2°F | Resp 16 | Ht 66.0 in | Wt 158.0 lb

## 2016-10-18 DIAGNOSIS — Z794 Long term (current) use of insulin: Secondary | ICD-10-CM

## 2016-10-18 DIAGNOSIS — E1100 Type 2 diabetes mellitus with hyperosmolarity without nonketotic hyperglycemic-hyperosmolar coma (NKHHC): Secondary | ICD-10-CM

## 2016-10-18 DIAGNOSIS — I10 Essential (primary) hypertension: Secondary | ICD-10-CM | POA: Diagnosis not present

## 2016-10-18 DIAGNOSIS — R059 Cough, unspecified: Secondary | ICD-10-CM

## 2016-10-18 DIAGNOSIS — I209 Angina pectoris, unspecified: Secondary | ICD-10-CM

## 2016-10-18 DIAGNOSIS — I25119 Atherosclerotic heart disease of native coronary artery with unspecified angina pectoris: Secondary | ICD-10-CM

## 2016-10-18 DIAGNOSIS — R05 Cough: Secondary | ICD-10-CM | POA: Diagnosis not present

## 2016-10-18 LAB — GLUCOSE, POCT (MANUAL RESULT ENTRY): POC Glucose: 187 mg/dl — AB (ref 70–99)

## 2016-10-18 MED ORDER — NITROGLYCERIN 0.4 MG SL SUBL: 0.4000 mg | SUBLINGUAL_TABLET | SUBLINGUAL | 1 refills | Status: DC | PRN

## 2016-10-18 MED ORDER — LISINOPRIL 5 MG PO TABS: 5.0000 mg | ORAL_TABLET | Freq: Every day | ORAL | 1 refills | Status: DC

## 2016-10-18 NOTE — Progress Notes (Signed)
Subjective:  By signing my name below, I, Barry [name], attest that this documentation has been prepared under the direction and in the presence of Merri Ray, MD. Electronically Signed: [name], [place]. 10/18/2016 , 4:30 PM .  Patient was seen in Room 12 .   Patient ID: Barry [name], male    DOB: 04-26-1951, 66 y.o.   MRN: [id number] Chief Complaint  Patient presents with  . Follow-up    diabetes  . Medication Problem    Pt has expired lisinopril with him, he is unsure if he is should be taking lisinopril along with amlodipine   HPI Barry [name] is a 66 y.o. male Here for hospital follow up. History of diabetes, HTN and multiple other medical problems per problem list including CAD. He was most recently seen here on Jan 16th with non-adherence to medications at that time, and glucose at 935. He was admitted to [facility] with hyperosmolar non ketotic glycemia, initially treated with an insulin infusion, then transition to levemir and sliding scale insulin. Levemir was increased to 20 units bid, and added metformin 571m bid, with plan for further optimization of diabetes in outpatient setting. He was also referred to diet and nutritionist. Spent time reviewing hospital records.   Here for diabetes follow up. He has increased his levemir to 20 units bid and metformin 5061mbid as instructed. His sugar has been running lowest 230s to highest 330s. He denies any symptomatic lows. He has an appointment with his PCP, Dr. CoLorelei Pontnext week (Jan 30th). He denies vomiting, abdominal pain, dizziness, nausea, fever, or appetite loss.   Cough Patient also mentions having cough that started about 2 nights ago. He denies history of asthma or previous use of inhalers. He denies shortness of breath, fever, congestion or rhinorrhea.   Acute Kidney Injury During hospitalization, prerenal azotemia resolved.   History of CAD Asymptomatic. He is on aspirin, plavix, a statin and beta blocker.   He  also reports having chest pain every 2~3 weeks since stents placed. He would place 1 nitroglycerin under his tongue each episode. He hasn't seen his cardiologist (Dr. WeKathlen Modyregarding this issue. He brought in his medications and is taking Ranexa for his angina. He hasn't been taking his lisinopril recently.   History of CVA Continued on plavix and aspirin.   There is language barrier as [place] translated for him in the room. Understanding expressed.   Patient Active Problem List   Diagnosis Date Noted  . Type 2 diabetes mellitus with hyperosmolar nonketotic hyperglycemia (HCLib[place]01/16/2018  . Chest pain 06/25/2015  . Pain in the chest   . Atherosclerotic peripheral vascular disease with ulceration (HCWright07/04/2015  . Special screening for malignant neoplasms, colon 05/07/2014  . CAD (coronary artery disease), native coronary artery 03/28/2014  . Aneurysm of iliac artery (HCLake Park06/23/2015  . Abdominal pain, unspecified site 03/18/2014  . Syncope in setting of nausea and vomiting 03/07/14 03/08/2014  . Nausea and vomiting 03/07/14 03/08/2014  . Chest pain with moderate risk of acute coronary syndrome 03/07/2014  . CAD- RCA and LCX stents 2015 02/28/2014  . Non compliance with medical treatment 02/28/2014  . Dyslipidemia, goal LDL below 70 02/28/2014  . Bradycardia- beta blocker decreased 02/28/2014  . Acute on chronic diastolic CHF (congestive heart failure), NYHA class 3 - due to NSTEMI 02/28/2014  . Diastolic CHF (HCOglala0637/85/8850. Type 2 diabetes mellitus with manifestations (HCMead06/12/2013  . TIA (transient ischemic  attack) 02/26/2014  . NSTEMI- 02/27/14 02/26/2014  . Pain in limb-Abdomen  12/06/2013  . Weakness-Left arm / Lef 12/06/2013  . Mesenteric artery stenosis ([place]) 12/06/2013  . 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[place]) 12/03/2012  . Hypertension 11/10/2011  . History of CVA in adulthood 11/10/2011  . Language barrier 11/10/2011   Past  Medical History:  Diagnosis Date  . CAD (coronary artery disease)    a. LHC (02/26/14):  mLAD 20 (faint L>R collats), mCFX 50, pRCA 95 (plaque rupture) >> PCI:  Xience DES to pRCA (severe tortuosity of R innominate artery >> needs L radial or FA in future);  b. LHC (03/10/14):  CFX 90, oOM 70, pRCA stent patent >> PCI:  Xience DES to mCFX   . Carotid stenosis    a. Carotid US (02/27/14):  Bilateral ICA 1-39%  . Diabetes mellitus   . 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[place])    a. Mesenteric Artery Duplex (7/15):  pSMA chronic dissection with aneurysmal dilation of 1.29 cm (VVS);  b.  Chest CTA (02/26/14):  IMPRESSION:  1. No aortic dissection or other acute abnormality.  2. 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  3. Stable 17 mm ectasia of the left common iliac artery.  4. Atherosclerosis, including aortoiliac and coronary artery disease.   Marland Kitchen History of echocardiogram 09/2015   Echo 1/17: mod LVH, EF 55%, inf-lat HK, Gr 1 DD, mild LAE  . Hx of cardiovascular stress test    Lexiscan Myoview (2/16):  Inferior lateral defect c/w thinning vs small prior infarct, no ischemia, EF 49%, Low Risk  . Hx of echocardiogram    a.  Echocardiogram (02/27/14):  Mod focal basal and mild concentric LVH, EF 50-55%, no RWMA, Gr 1 DD, mild TR, normal RVF  . Hyperlipidemia   . Hypertension   . Iliac artery aneurysm, left ([place])   . Myocardial infarction   . Stroke [facility])    Past Surgical History:  Procedure Laterality Date  . Admission  09/26/2009   CVA.  [name].  Marland Kitchen CARDIAC CATHETERIZATION    . CARDIAC CATHETERIZATION N/A 06/25/2015   Procedure: Left Heart Cath and Coronary Angiography;  Surgeon: Burnell Blanks, MD;  Location: [facility] CV LAB;  Service: Cardiovascular;  Laterality: N/A;  . COLONOSCOPY WITH PROPOFOL N/A 07/23/2015   Procedure: COLONOSCOPY WITH PROPOFOL;  Surgeon: [name], MD;  Location: [facility] ENDOSCOPY;  Service: Endoscopy;  Laterality: N/A;  . CORONARY ANGIOPLASTY    . LEFT  HEART CATHETERIZATION WITH CORONARY ANGIOGRAM N/A 02/26/2014   Procedure: LEFT HEART CATHETERIZATION WITH CORONARY ANGIOGRAM;  Surgeon: Wellington Hampshire, MD;  Location: [facility] CATH LAB;  Service: Cardiovascular;  Laterality: N/A;  . LEFT HEART CATHETERIZATION WITH CORONARY ANGIOGRAM N/A 03/10/2014   Procedure: LEFT HEART CATHETERIZATION WITH CORONARY ANGIOGRAM;  Surgeon: Jettie Booze, MD;  Location: [facility] CATH LAB;  Service: Cardiovascular;  Laterality: N/A;   No Known Allergies Prior to Admission medications   Medication Sig Start Date End Date Taking? Authorizing Provider  amLODipine (NORVASC) 5 MG tablet Take 1 tablet (5 mg total) by mouth daily. 04/20/15  Yes Liliane Shi, PA-C  aspirin 81 MG EC tablet Take 81 mg by mouth daily as needed.  02/12/15  Yes Larey Dresser, MD  [name] Glucose Monitoring Suppl (ONE TOUCH ULTRA SYSTEM KIT) W/DEVICE KIT 1 kit by Does not apply route once. One touch delica lancets, and meter Pt will test once daily. Dx 250.92 04/25/14  Yes [name] Mons, PA-C  carvedilol (  COREG) 6.25 MG tablet Take 1 tablet (6.25 mg total) by mouth 2 (two) times daily with a meal. 09/28/15  Yes Gay Filler Copland, MD  clopidogrel (PLAVIX) 75 MG tablet Take 1 tablet (75 mg total) by mouth daily with breakfast. 02/12/15  Yes Larey Dresser, MD  docusate sodium (COLACE) 100 MG capsule Take 1 capsule (100 mg total) by mouth daily. For constipation 04/03/15  Yes Shawnee Knapp, MD  gabapentin (NEURONTIN) 300 MG capsule TAKE ONE CAPSULE BY MOUTH THREE TIMES DAILY AND  2  TABS  AS  NEEDED  AT  NIGHT  FOR  LEG  PAIN 10/11/16  Yes Wendie Agreste, MD  glucose [name] (ONE TOUCH ULTRA TEST) test strip Test [name] sugar 3 times a day. Dx code: E11.8 10/11/16  Yes Wendie Agreste, MD  glucose monitoring kit (FREESTYLE) monitoring kit 1 each by Does not apply route 4 (four) times daily - after meals and at bedtime. 1 month Diabetic Testing Supplies for QAC-QHS accuchecks. 10/13/16  Yes Shanker Kristeen Mans, MD  Insulin  Detemir (LEVEMIR FLEXTOUCH) 100 UNIT/ML Pen Inject 20 Units into the skin 2 (two) times daily. 10/13/16  Yes Shanker Kristeen Mans, MD  Insulin Pen Needle 32G X 8 MM MISC Use as directed 10/13/16  Yes Shanker Kristeen Mans, MD  isosorbide mononitrate (IMDUR) 30 MG 24 hr tablet Take 1 tablet (30 mg total) by mouth daily. 06/22/15  Yes Liliane Shi, PA-C  Lancets MISC Use to check [name] sugar three times daily. Dx: E11.8 10/11/16  Yes Wendie Agreste, MD  lisinopril (PRINIVIL,ZESTRIL) 20 MG tablet Take 1 tablet (20 mg total) by mouth daily. 04/20/15  Yes Liliane Shi, PA-C  metFORMIN (GLUCOPHAGE) 500 MG tablet Take 1 tablet (500 mg total) by mouth 2 (two) times daily with a meal. 10/13/16  Yes Shanker Kristeen Mans, MD  nitroGLYCERIN (NITROSTAT) 0.4 MG SL tablet Place 1 tablet (0.4 mg total) under the tongue every 5 (five) minutes as needed for chest pain. 04/08/15  Yes Larey Dresser, MD  pantoprazole (PROTONIX) 40 MG tablet Take 1 tablet (40 mg total) by mouth daily. 06/22/15  Yes Liliane Shi, PA-C  polyethylene glycol powder (GLYCOLAX/MIRALAX) powder Take 17 g by mouth 2 (two) times daily as needed for mild constipation. 04/03/15  Yes Shawnee Knapp, MD  ranitidine (ZANTAC) 150 MG tablet Take 150 mg by mouth 2 (two) times daily.   Yes Historical Provider, MD  ranolazine (RANEXA) 500 MG 12 hr tablet Take 1 tablet (500 mg total) by mouth 2 (two) times daily. 10/14/15  Yes Scott Joylene Draft, PA-C  rosuvastatin (CRESTOR) 40 MG tablet Take 1 tablet (40 mg total) by mouth daily. 10/14/15  Yes Liliane Shi, PA-C   Social History   Social History  . Marital status: Married    Spouse name: N/A  . Number of children: 3  . Years of education: N/A   Occupational History  . Retired    Social History Main Topics  . Smoking status: Former Smoker    Quit date: 06/09/2009  . Smokeless tobacco: Never Used  . Alcohol use No  . Drug use: No  . Sexual activity: Not Currently   Other Topics Concern  . Not on file   Social  History Narrative   Marital: married.      Lives: with son,wife.       Children: 3 children; 6 grandchildren      Employed: unemployed; disability unknown reason/CVA L  sided weakness      Tobacco:  Quit 2012; smoked 40 years.       Alcohol: no drinking now; social in past.       Drugs: none       Exercise: sporadic.       ADLs:  No driving since CVA.   Review of Systems  Constitutional: Negative for appetite change, fatigue, fever and unexpected weight change.  HENT: Negative for congestion and rhinorrhea.   Eyes: Negative for visual disturbance.  Respiratory: Positive for cough. Negative for chest tightness, shortness of breath and wheezing.   Cardiovascular: Negative for chest pain, palpitations and leg swelling.  Gastrointestinal: Negative for abdominal pain, [name] in stool, diarrhea, nausea and vomiting.  Neurological: Negative for dizziness, light-headedness and headaches.       Objective:   Physical Exam  Constitutional: He is oriented to person, place, and time. He appears well-developed and well-nourished.  HENT:  Head: Normocephalic and atraumatic.  Eyes: EOM are normal. Pupils are equal, round, and reactive to light.  Neck: No JVD present. Carotid bruit is not present.  Cardiovascular: Normal rate, regular rhythm and normal heart sounds.   No murmur heard. Pulmonary/Chest: Effort normal and breath sounds normal. He has no rales.  Musculoskeletal: He exhibits no edema.  Neurological: He is alert and oriented to person, place, and time.  Skin: Skin is warm and dry.  Psychiatric: He has a normal mood and affect.  Vitals reviewed.   Vitals:   10/18/16 1446  BP: 130/74  Pulse: 68  Resp: 16  Temp: 98.2 F (36.8 C)  TempSrc: Oral  SpO2: 97%  Weight: 158 lb (71.7 kg)  Height: 5' 6" (1.676 m)   Results for orders placed or performed in visit on 10/18/16  POCT glucose (manual entry)  Result Value Ref Range   POC Glucose 187 (A) 70 - 99 mg/dl       Assessment  & Plan:    Barry [name] is a 66 y.o. male Type 2 diabetes mellitus with hyperosmolarity without coma, with long-term current use of insulin ([place]) - Plan: POCT glucose (manual entry), Basic metabolic panel  - Improving since hospitalization. Discussed need for maintaining regular diet, as well as avoiding foods that may markedly increased glucose.  -Still having elevated readings at home, Poe we'll try increasing his Levemir to 22 units twice a day. Continue metformin at same dose. If still persistent elevated readings in 3-4 days, can increase to 24 units Levemir twice a day.  -Hypoglycemic precautions given with increases in regimen  - Endocrine referral pending, diabetes [facility] referral was also placed from hospital. This should help diet changes and other self-management of diabetes.  Cough  - Minimal, lungs clear, suspected early viral illness, afebrile and reassuring exam. RTC precautions. Symptomatic care discussed.  Coronary artery disease involving native coronary artery of native heart with angina pectoris ([place]) - Plan: nitroGLYCERIN (NITROSTAT) 0.4 MG SL tablet  - History of episodic angina, but denies any change or worsening, denies increased frequency. Agreed to refill his nitroglycerin tablets, but stressed importance of follow-up with cardiologist. ER/911 precautions given if worsening or persistent chest pain.  Essential hypertension - Plan: CBC with Differential/Platelet, DISCONTINUED: lisinopril (PRINIVIL,ZESTRIL) 5 MG tablet  -Medications were reviewed, he has been off of his lisinopril 20 mg for some time. Based on diabetes and other medical problems, would like to see him start back on ACE inhibitor, low-dose. Started at 5 mg lisinopril daily for now.  Follow-up scheduled in one week with his primary care provider, Dr. Lorelei Pont. RTC precautions if needed sooner.   Meds ordered this encounter  Medications  . nitroGLYCERIN (NITROSTAT) 0.4 MG SL tablet     Sig: Place 1 tablet (0.4 mg total) under the tongue every 5 (five) minutes as needed for chest pain.    Dispense:  25 tablet    Refill:  1  . DISCONTD: lisinopril (PRINIVIL,ZESTRIL) 5 MG tablet    Sig: Take 1 tablet (5 mg total) by mouth daily.    Dispense:  30 tablet    Refill:  1   Patient Instructions   Increase Levemir to 22 units twice per day, continue metformin twice per day. In 3-4  days if your sugars are still running over 300, increase levemir to 24 units twice per day.  If any [name] sugars less than 150, do not increase insulin any further (remain at the same dose).  If nausea, vomiting, abdominal pain, dizziness or new symptoms - be seen here or in emergency room.  See precautions below to watch for any low [name] sugars as increasing your diabetes medication.   Cough may be due to virus/cold virus. If fever, shortness of breath or worsening - return here or emergency room if needed.   You need to see your heart doctor to discuss your chest pains.  Call their office tomorrow to schedule appointment. If pain does not resolve with use of nitroglycerin under tongue - call 911 or go to emergency room. Bring your meds to taht visit to clarify which heart medications you are supposed to be taking.  Dr. Aundra Dubin: [address], [place], [place] [address], Phone: [phone]  Keep follow-up with Dr. Lorelei Pont next week as scheduled. Bring your medicines to that visit as well.   Hypoglycemia Hypoglycemia occurs when the level of sugar (glucose) in the [name] is too low. Glucose is a type of sugar that provides the body's main source of energy. Certain hormones (insulin and glucagon) control the level of glucose in the [name]. Insulin lowers [name] glucose, and glucagon increases [name] glucose. Hypoglycemia can result from having too much insulin in the bloodstream, or from not eating enough food that contains glucose. Hypoglycemia can happen in people who do or do not have diabetes. It can  develop quickly, and it can be a medical emergency. What are the causes? Hypoglycemia occurs most often in people who have diabetes. If you have diabetes, hypoglycemia may be caused by:  Diabetes medicine.  Not eating enough, or not eating often enough.  Increased physical activity.  Drinking alcohol, especially when you have not eaten recently. If you do not have diabetes, hypoglycemia may be caused by:  A tumor in the pancreas. The pancreas is the organ that makes insulin.  Not eating enough, or not eating for long periods at a time (fasting).  Severe infection or illness that affects the liver, heart, or kidneys.  Certain medicines. You may also have reactive hypoglycemia. This condition causes hypoglycemia within 4 hours of eating a meal. This may occur after having stomach surgery. Sometimes, the cause of reactive hypoglycemia is not known. What increases the risk? Hypoglycemia is more likely to develop in:  People who have diabetes and take medicines to lower [name] glucose.  People who abuse alcohol.  People who have a severe illness. What are the signs or symptoms? Hypoglycemia may not cause any symptoms. If you have symptoms, they may include:  Hunger.  Anxiety.  Sweating and feeling clammy.  Confusion.  Dizziness or feeling light-headed.  Sleepiness.  Nausea.  Increased heart rate.  Headache.  Blurry vision.  Seizure.  Nightmares.  Tingling or numbness around the mouth, lips, or tongue.  A change in speech.  Decreased ability to concentrate.  A change in coordination.  Restless sleep.  Tremors or shakes.  Fainting.  Irritability. How is this diagnosed? Hypoglycemia is diagnosed with a [name] test to measure your [name] glucose level. This [name] test is done while you are having symptoms. Your health care provider may also do a physical exam and review your medical history. If you do not have diabetes, other tests may be done to find the  cause of your hypoglycemia. How is this treated? This condition can often be treated by immediately eating or drinking something that contains glucose, such as:  3-4 sugar tablets (glucose pills).  Glucose gel, 15-gram tube.  Fruit juice, 4 oz (120 mL).  Regular soda (not diet soda), 4 oz (120 mL).  Low-fat milk, 4 oz (120 mL).  Several pieces of hard candy.  Sugar or honey, 1 Tbsp. Treating Hypoglycemia If You Have Diabetes  If you are alert and able to swallow safely, follow the 15:15 rule:  Take 15 grams of a rapid-acting carbohydrate. Rapid-acting options include:  1 tube of glucose gel.  3 glucose pills.  6-8 pieces of hard candy.  4 oz (120 mL) of fruit juice.  4 oz (120 ml) of regular (not diet) soda.  Check your [name] glucose 15 minutes after you take the carbohydrate.  If the repeat [name] glucose level is still at or below 70 mg/dL (3.9 mmol/L), take 15 grams of a carbohydrate again.  If your [name] glucose level does not increase above 70 mg/dL (3.9 mmol/L) after 3 tries, seek emergency medical care.  After your [name] glucose level returns to normal, eat a meal or a snack within 1 hour. Treating Severe Hypoglycemia  Severe hypoglycemia is when your [name] glucose level is at or below 54 mg/dL (3 mmol/L). Severe hypoglycemia is an emergency. Do not wait to see if the symptoms will go away. Get medical help right away. Call your local emergency services (911 in the [place] Do not drive yourself to the hospital. If you have severe hypoglycemia and you cannot eat or drink, you may need an injection of glucagon. A family member or close friend should learn how to check your [name] glucose and how to give you a glucagon injection. Ask your health care provider if you need to have an emergency glucagon injection kit available. Severe hypoglycemia may need to be treated in a hospital. The treatment may include getting glucose through an IV tube. You may also need treatment for  the cause of your hypoglycemia. Follow these instructions at home: General instructions  Avoid any diets that cause you to not eat enough food. Talk with your health care provider before you start any new diet.  Take over-the-counter and prescription medicines only as told by your health care provider.  Limit alcohol intake to no more than 1 drink per day for nonpregnant women and 2 drinks per day for men. One drink equals 12 oz of beer, 5 oz of wine, or 1 oz of hard liquor.  Keep all follow-up visits as told by your health care provider. This is important. If You Have Diabetes:   Make sure you know the symptoms of hypoglycemia.  Always have a rapid-acting carbohydrate snack with you to  treat low [name] sugar.  Follow your diabetes management plan, as told by your health care provider. Make sure you:  Take your medicines as directed.  Follow your exercise plan.  Follow your meal plan. Eat on time, and do not skip meals.  Check your [name] glucose as often as directed. Make sure to check your [name] glucose before and after exercise. If you exercise longer or in a different way than usual, check your [name] glucose more often.  Follow your sick day plan whenever you cannot eat or drink normally. Make this plan in advance with your health care provider.  Share your diabetes management plan with people in your workplace, school, and household.  Check your urine for ketones when you are ill and as told by your health care provider.  Carry a medical alert card or wear medical alert jewelry. If You Have Reactive Hypoglycemia or Low [name] Sugar From Other Causes:  Monitor your [name] glucose as told by your health care provider.  Follow instructions from your health care provider about eating or drinking restrictions. Contact a health care provider if:  You have problems keeping your [name] glucose in your target range.  You have frequent episodes of hypoglycemia. Get help right away  if:  You continue to have hypoglycemia symptoms after eating or drinking something containing glucose.  Your [name] glucose is at or below 54 mg/dL (3 mmol/L).  You have a seizure.  You faint. These symptoms may represent a serious problem that is an emergency. Do not wait to see if the symptoms will go away. Get medical help right away. Call your local emergency services (911 in the [place] Do not drive yourself to the hospital.  This information is not intended to replace advice given to you by your health care provider. Make sure you discuss any questions you have with your health care provider. Document Released: 09/12/2005 Document Revised: 02/24/2016 Document Reviewed: 10/16/2015 Elsevier Interactive Patient Education  2017 [employer].    IF you received an x-ray today, you will receive an invoice from [facility]. Please contact [facility] at [phone] with questions or concerns regarding your invoice.   IF you received labwork today, you will receive an invoice from [facility]. Please contact [facility] at [phone] with questions or concerns regarding your invoice.   Our billing staff will not be able to assist you with questions regarding bills from these companies.  You will be contacted with the lab results as soon as they are available. The fastest way to get your results is to activate your My Chart account. Instructions are located on the last page of this paperwork. If you have not heard from [place] regarding the results in 2 weeks, please contact this office.      I personally performed the services described in this documentation, which was scribed in my presence. The recorded information has been reviewed and considered, and addended by me as needed.   Signed,   Merri Ray, MD [facility].  10/19/16 9:54 PM

## 2016-10-18 NOTE — Patient Instructions (Addendum)
Increase Levemir to 22 units twice per day, continue metformin twice per day. In 3-4  days if your sugars are still running over 300, increase levemir to 24 units twice per day.  If any blood sugars less than 150, do not increase insulin any further (remain at the same dose).  If nausea, vomiting, abdominal pain, dizziness or new symptoms - be seen here or in emergency room.  See precautions below to watch for any low blood sugars as increasing your diabetes medication.   Cough may be due to virus/cold virus. If fever, shortness of breath or worsening - return here or emergency room if needed.   You need to see your heart doctor to discuss your chest pains.  Call their office tomorrow to schedule appointment. If pain does not resolve with use of nitroglycerin under tongue - call 911 or go to emergency room. Bring your meds to taht visit to clarify which heart medications you are supposed to be taking.  Dr. Aundra Dubin: [address], [place], [place] [address], Phone: [phone]  Keep follow-up with Dr. Lorelei Pont next week as scheduled. Bring your medicines to that visit as well.   Hypoglycemia Hypoglycemia occurs when the level of sugar (glucose) in the blood is too low. Glucose is a type of sugar that provides the body's main source of energy. Certain hormones (insulin and glucagon) control the level of glucose in the blood. Insulin lowers blood glucose, and glucagon increases blood glucose. Hypoglycemia can result from having too much insulin in the bloodstream, or from not eating enough food that contains glucose. Hypoglycemia can happen in people who do or do not have diabetes. It can develop quickly, and it can be a medical emergency. What are the causes? Hypoglycemia occurs most often in people who have diabetes. If you have diabetes, hypoglycemia may be caused by:  Diabetes medicine.  Not eating enough, or not eating often enough.  Increased physical activity.  Drinking alcohol,  especially when you have not eaten recently. If you do not have diabetes, hypoglycemia may be caused by:  A tumor in the pancreas. The pancreas is the organ that makes insulin.  Not eating enough, or not eating for long periods at a time (fasting).  Severe infection or illness that affects the liver, heart, or kidneys.  Certain medicines. You may also have reactive hypoglycemia. This condition causes hypoglycemia within 4 hours of eating a meal. This may occur after having stomach surgery. Sometimes, the cause of reactive hypoglycemia is not known. What increases the risk? Hypoglycemia is more likely to develop in:  People who have diabetes and take medicines to lower blood glucose.  People who abuse alcohol.  People who have a severe illness. What are the signs or symptoms? Hypoglycemia may not cause any symptoms. If you have symptoms, they may include:  Hunger.  Anxiety.  Sweating and feeling clammy.  Confusion.  Dizziness or feeling light-headed.  Sleepiness.  Nausea.  Increased heart rate.  Headache.  Blurry vision.  Seizure.  Nightmares.  Tingling or numbness around the mouth, lips, or tongue.  A change in speech.  Decreased ability to concentrate.  A change in coordination.  Restless sleep.  Tremors or shakes.  Fainting.  Irritability. How is this diagnosed? Hypoglycemia is diagnosed with a blood test to measure your blood glucose level. This blood test is done while you are having symptoms. Your health care provider may also do a physical exam and review your medical history. If you  do not have diabetes, other tests may be done to find the cause of your hypoglycemia. How is this treated? This condition can often be treated by immediately eating or drinking something that contains glucose, such as:  3-4 sugar tablets (glucose pills).  Glucose gel, 15-gram tube.  Fruit juice, 4 oz (120 mL).  Regular soda (not diet soda), 4 oz (120  mL).  Low-fat milk, 4 oz (120 mL).  Several pieces of hard candy.  Sugar or honey, 1 Tbsp. Treating Hypoglycemia If You Have Diabetes  If you are alert and able to swallow safely, follow the 15:15 rule:  Take 15 grams of a rapid-acting carbohydrate. Rapid-acting options include:  1 tube of glucose gel.  3 glucose pills.  6-8 pieces of hard candy.  4 oz (120 mL) of fruit juice.  4 oz (120 ml) of regular (not diet) soda.  Check your blood glucose 15 minutes after you take the carbohydrate.  If the repeat blood glucose level is still at or below 70 mg/dL (3.9 mmol/L), take 15 grams of a carbohydrate again.  If your blood glucose level does not increase above 70 mg/dL (3.9 mmol/L) after 3 tries, seek emergency medical care.  After your blood glucose level returns to normal, eat a meal or a snack within 1 hour. Treating Severe Hypoglycemia  Severe hypoglycemia is when your blood glucose level is at or below 54 mg/dL (3 mmol/L). Severe hypoglycemia is an emergency. Do not wait to see if the symptoms will go away. Get medical help right away. Call your local emergency services (911 in the [place] Do not drive yourself to the hospital. If you have severe hypoglycemia and you cannot eat or drink, you may need an injection of glucagon. A family member or close friend should learn how to check your blood glucose and how to give you a glucagon injection. Ask your health care provider if you need to have an emergency glucagon injection kit available. Severe hypoglycemia may need to be treated in a hospital. The treatment may include getting glucose through an IV tube. You may also need treatment for the cause of your hypoglycemia. Follow these instructions at home: General instructions  Avoid any diets that cause you to not eat enough food. Talk with your health care provider before you start any new diet.  Take over-the-counter and prescription medicines only as told by your health care  provider.  Limit alcohol intake to no more than 1 drink per day for nonpregnant women and 2 drinks per day for men. One drink equals 12 oz of beer, 5 oz of wine, or 1 oz of hard liquor.  Keep all follow-up visits as told by your health care provider. This is important. If You Have Diabetes:   Make sure you know the symptoms of hypoglycemia.  Always have a rapid-acting carbohydrate snack with you to treat low blood sugar.  Follow your diabetes management plan, as told by your health care provider. Make sure you:  Take your medicines as directed.  Follow your exercise plan.  Follow your meal plan. Eat on time, and do not skip meals.  Check your blood glucose as often as directed. Make sure to check your blood glucose before and after exercise. If you exercise longer or in a different way than usual, check your blood glucose more often.  Follow your sick day plan whenever you cannot eat or drink normally. Make this plan in advance with your health care provider.  Share your diabetes  management plan with people in your workplace, school, and household.  Check your urine for ketones when you are ill and as told by your health care provider.  Carry a medical alert card or wear medical alert jewelry. If You Have Reactive Hypoglycemia or Low Blood Sugar From Other Causes:  Monitor your blood glucose as told by your health care provider.  Follow instructions from your health care provider about eating or drinking restrictions. Contact a health care provider if:  You have problems keeping your blood glucose in your target range.  You have frequent episodes of hypoglycemia. Get help right away if:  You continue to have hypoglycemia symptoms after eating or drinking something containing glucose.  Your blood glucose is at or below 54 mg/dL (3 mmol/L).  You have a seizure.  You faint. These symptoms may represent a serious problem that is an emergency. Do not wait to see if the  symptoms will go away. Get medical help right away. Call your local emergency services (911 in the [place] Do not drive yourself to the hospital.  This information is not intended to replace advice given to you by your health care provider. Make sure you discuss any questions you have with your health care provider. Document Released: 09/12/2005 Document Revised: 02/24/2016 Document Reviewed: 10/16/2015 Elsevier Interactive Patient Education  2017 [employer].    IF you received an x-ray today, you will receive an invoice from [facility]. Please contact [facility] at [phone] with questions or concerns regarding your invoice.   IF you received labwork today, you will receive an invoice from [facility]. Please contact [facility] at [phone] with questions or concerns regarding your invoice.   Our billing staff will not be able to assist you with questions regarding bills from these companies.  You will be contacted with the lab results as soon as they are available. The fastest way to get your results is to activate your My Chart account. Instructions are located on the last page of this paperwork. If you have not heard from [place] regarding the results in 2 weeks, please contact this office.

## 2016-10-19 ENCOUNTER — Ambulatory Visit (INDEPENDENT_AMBULATORY_CARE_PROVIDER_SITE_OTHER): Payer: Medicare Other | Admitting: Physician Assistant

## 2016-10-19 ENCOUNTER — Encounter: Payer: Self-pay | Admitting: Physician Assistant

## 2016-10-19 VITALS — BP 160/78 | HR 60 | Ht 66.0 in | Wt 159.0 lb

## 2016-10-19 DIAGNOSIS — E785 Hyperlipidemia, unspecified: Secondary | ICD-10-CM | POA: Diagnosis not present

## 2016-10-19 DIAGNOSIS — I10 Essential (primary) hypertension: Secondary | ICD-10-CM | POA: Diagnosis not present

## 2016-10-19 DIAGNOSIS — I2510 Atherosclerotic heart disease of native coronary artery without angina pectoris: Secondary | ICD-10-CM | POA: Diagnosis not present

## 2016-10-19 DIAGNOSIS — I7779 Dissection of other artery: Secondary | ICD-10-CM | POA: Diagnosis not present

## 2016-10-19 DIAGNOSIS — E118 Type 2 diabetes mellitus with unspecified complications: Secondary | ICD-10-CM

## 2016-10-19 DIAGNOSIS — I25119 Atherosclerotic heart disease of native coronary artery with unspecified angina pectoris: Secondary | ICD-10-CM

## 2016-10-19 LAB — BASIC METABOLIC PANEL
BUN/Creatinine Ratio: 10 (ref 10–24)
BUN: 11 mg/dL (ref 8–27)
CO2: 29 mmol/L (ref 18–29)
Calcium: 9.9 mg/dL (ref 8.6–10.2)
Chloride: 96 mmol/L (ref 96–106)
Creatinine, Ser: 1.1 mg/dL (ref 0.76–1.27)
GFR calc Af Amer: 80 mL/min/{1.73_m2} (ref >59)
GFR calc non Af Amer: 70 mL/min/{1.73_m2} (ref >59)
Glucose: 195 mg/dL — ABNORMAL HIGH (ref 65–99)
Potassium: 5 mmol/L (ref 3.5–5.2)
Sodium: 140 mmol/L (ref 134–144)

## 2016-10-19 LAB — CBC WITH DIFFERENTIAL/PLATELET
Basophils Absolute: 0 10*3/uL (ref 0.0–0.2)
Basos: 0 %
EOS (ABSOLUTE): 0.2 10*3/uL (ref 0.0–0.4)
Eos: 3 %
Hematocrit: 48 % (ref 37.5–51.0)
Hemoglobin: 15.9 g/dL (ref 13.0–17.7)
Immature Grans (Abs): 0 10*3/uL (ref 0.0–0.1)
Immature Granulocytes: 1 %
Lymphocytes Absolute: 2.1 10*3/uL (ref 0.7–3.1)
Lymphs: 33 %
MCH: 28.2 pg (ref 26.6–33.0)
MCHC: 33.1 g/dL (ref 31.5–35.7)
MCV: 85 fL (ref 79–97)
Monocytes Absolute: 0.5 10*3/uL (ref 0.1–0.9)
Monocytes: 7 %
Neutrophils Absolute: 3.6 10*3/uL (ref 1.4–7.0)
Neutrophils: 56 %
Platelets: 216 10*3/uL (ref 150–379)
RBC: 5.64 x10E6/uL (ref 4.14–5.80)
RDW: 13 % (ref 12.3–15.4)
WBC: 6.4 10*3/uL (ref 3.4–10.8)

## 2016-10-19 MED ORDER — LISINOPRIL 10 MG PO TABS: 10.0000 mg | ORAL_TABLET | Freq: Every day | ORAL | 11 refills | Status: DC

## 2016-10-19 MED ORDER — ROSUVASTATIN CALCIUM 40 MG PO TABS: 40.0000 mg | ORAL_TABLET | Freq: Every day | ORAL | 11 refills | Status: DC

## 2016-10-19 NOTE — Progress Notes (Signed)
Keep with you.

## 2016-10-19 NOTE — Patient Instructions (Addendum)
Medication Instructions:  Continue taking Aspirin every day. Start Lisinopril 10 mg Once daily for your blood pressure. Start Crestor (Rosuvastatin) 40 mg Once daily for your cholesterol.  Labwork: 1 week - BMET 10/28/16 lab open from 7:30 am to 4:30 pm ; come in any time that day   Testing/Procedures: None   Follow-Up: Richardson Dopp, PA-C 01/18/17 @ 1:45   Any Other Special Instructions Will Be Listed Below (If Applicable).  If you need a refill on your cardiac medications before your next appointment, please call your pharmacy.

## 2016-10-19 NOTE — Progress Notes (Addendum)
Cardiology Office Note:    Date:  10/19/2016   ID:  Daryll Maddix, DOB [birth year], MRN [id number]  PCP:  Abbe Amsterdam, MD  Cardiologist:  Dr. Marca Ancona   Electrophysiologist:  n/a VVS: Dr. Myra Gianotti  Referring MD: Pearline Cables, MD   Chief Complaint  Patient presents with  . Follow-up    CAD - seen with help of video interpreter-ID [phone] and interpreter via Telephone ID # [phone]    History of Present Illness:    Barry [name] is a 66 y.o. male with a hx of CAD, chronic superior mesenteric artery dissection, prior stroke, DM2, HTN, CKD, HL. He originally presented in 02/2014 with non-STEMI. LHC demonstrated a ruptured plaque and thrombus in the proximal RCA which was treated with a DES. He was readmitted several weeks later with unstable angina and LHC demonstrated high-grade stenosis in the CFX which was treated with a DES. He was seen 09/2014 and complained of left-sided chest discomfort and dizziness. Lexiscan Myoview was low risk with inferior lateral defect consistent with thinning versus small prior infarct. There was no ischemia. EF was 49%. LHC was arranged 9/16 for recurrent chest pain and demonstrated 2v CAD with patent mid LCx/OM2 stent and patent RCA stent, mod stenosis in a small caliber sub-branch of OM2 jailed by the old stent (unchanged from 2015), mod stenosis in a small to mod caliber ostial PDA (unchanged from 2015). Med Rx was recommended.  Last seen in 1/17 with recurrent chest pain.    He returns for Cardiology follow up.  Recent notes from his PCP indicate he has been having chest pain.  He is seen today with the help of an interpreter.  The patient denies any chest pain, shortness of breath, syncope, orthopnea, PND, edema.  He tells me he stopped all of his medications 6 mos ago due to rectal bleeding.  This has not recurred.  Overall, he says he feels good.   Prior CV studies that were reviewed today include:    Abdominal CTA 2/17 IMPRESSION: 1. Stable non  flow limiting dissection in the mid SMA, with 14 mm aneurysmal dilatation of the affected segment as before. 2. 17 mm fusiform left common iliac artery aneurysm, stable. 3. Cholelithiasis. 4. Right nephrolithiasis without hydronephrosis.  Echo 10/27/15 Mod LVH, EF 55, inf-lat HK, Gr 1 DD, trivial AI, mild LAE  LHC 9/16 LAD: Ostial 30%, prox 30%, dist 30%, D1 30% LCx: prox 20%, OM2 stent ok with 10% ISR, small lateral OM2 80% RCA: Ostial stent ok with 15%, mid 20%, dist 20%, ostial RPDA 60% EF 55-65% 1. Double vessel CAD with patent stent mid Circumflex/OM2 and patent stent RCA 2. Moderate stenosis in the small caliber sub-branch of OM2 where it is jailed by the old stent. This is a small caliber branch vessel. Unchanged in appearance from last cath in 2015.  3. Moderate stenosis in the small to moderate caliber ostial PDA, unchanged from last cath in 2015.  4. Normal LV function Recommendations: Continue medical therapy for CAD.   Myoview 2/16 Inferior lateral defect consistent with thinning vs small prior infarct; no ischemia. EF 49%  Past Medical History:  Diagnosis Date  . CAD (coronary artery disease)    a. LHC (02/26/14):  mLAD 20 (faint L>R collats), mCFX 50, pRCA 95 (plaque rupture) >> PCI:  Xience DES to pRCA (severe tortuosity of R innominate artery >> needs L radial or FA in future);  b. LHC (03/10/14):  CFX 90, oOM 70, pRCA stent patent >>  PCI:  Xience DES to [place]   . Carotid stenosis    a. Carotid US (02/27/14):  Bilateral ICA 1-39%  . Diabetes mellitus   . 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[place])    a. Mesenteric Artery Duplex (7/15):  pSMA chronic dissection with aneurysmal dilation of 1.29 cm ([facility]);  b.  Chest CTA (02/26/14):  IMPRESSION:  1. No aortic dissection or other acute abnormality.  2. 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  3. Stable 17 mm ectasia of the left common iliac artery.  4. Atherosclerosis, including aortoiliac and coronary artery disease.     Marland Kitchen History of echocardiogram 09/2015   Echo 1/17: mod LVH, EF 55%, inf-lat HK, Gr 1 DD, mild LAE  . Hx of cardiovascular stress test    Lexiscan Myoview (2/16):  Inferior lateral defect c/w thinning vs small prior infarct, no ischemia, EF 49%, Low Risk  . Hx of echocardiogram    a.  Echocardiogram (02/27/14):  Mod focal basal and mild concentric LVH, EF 50-55%, no RWMA, Gr 1 DD, mild TR, normal RVF  . Hyperlipidemia   . Hypertension   . Iliac artery aneurysm, left ([place])   . Myocardial infarction   . Stroke ([place])   1. HTN 2. Type II diabetes 3. CVA in 2011 4. AAA: 2.2 cm in 3/15.  5. PAD: Left CIA with penetrating ulcer. 6. Chronic superior mesenteric artery dissection, followed by Dr Trula Slade. 7. CKD 8. CAD: NSTEMI 6/15 with 95% proximal RCA stenosis treated with DES. Unstable angina later in 6/15, this time had DES to 90% mLCx. Echo (6/15) with EF 50-55%, moderate focal basal septal hypertrophy, normal RV size and systolic function. Lexiscan Cardiolite (2/16) with EF 49%, small fixed inferolateral defect with no ischemia.  9. Carotid dopplers (6/15) with mild stenosis.  10. Hyperlipidemia: Did not tolerate atorvastatin.  Past Surgical History:  Procedure Laterality Date  . Admission  09/26/2009   CVA.  [name].  Marland Kitchen CARDIAC CATHETERIZATION    . CARDIAC CATHETERIZATION N/A 06/25/2015   Procedure: Left Heart Cath and Coronary Angiography;  Surgeon: Burnell Blanks, MD;  Location: [facility];  Service: Cardiovascular;  Laterality: N/A;  . COLONOSCOPY WITH PROPOFOL N/A 07/23/2015   Procedure: COLONOSCOPY WITH PROPOFOL;  Surgeon: [name], MD;  Location: [facility];  Service: Endoscopy;  Laterality: N/A;  . CORONARY ANGIOPLASTY    . LEFT HEART CATHETERIZATION WITH CORONARY ANGIOGRAM N/A 02/26/2014   Procedure: LEFT HEART CATHETERIZATION WITH CORONARY ANGIOGRAM;  Surgeon: Wellington Hampshire, MD;  Location: [facility];  Service: Cardiovascular;  Laterality: N/A;  .  LEFT HEART CATHETERIZATION WITH CORONARY ANGIOGRAM N/A 03/10/2014   Procedure: LEFT HEART CATHETERIZATION WITH CORONARY ANGIOGRAM;  Surgeon: Jettie Booze, MD;  Location: [facility] LAB;  Service: Cardiovascular;  Laterality: N/A;    Current Medications: Current Meds  Medication Sig  . aspirin 81 MG EC tablet Take 81 mg by mouth daily as needed.   . Blood Glucose Monitoring Suppl (ONE TOUCH ULTRA SYSTEM KIT) W/DEVICE KIT 1 kit by Does not apply route once. One touch delica lancets, and meter Pt will test once daily. Dx 250.92  . gabapentin (NEURONTIN) 300 MG capsule TAKE ONE CAPSULE BY MOUTH THREE TIMES DAILY AND  2  TABS  AS  NEEDED  AT  NIGHT  FOR  LEG  PAIN  . glucose blood (ONE TOUCH ULTRA TEST) test strip Test blood sugar 3 times a day. Dx code: E11.8  . glucose monitoring kit (  FREESTYLE) monitoring kit 1 each by Does not apply route 4 (four) times daily - after meals and at bedtime. 1 month Diabetic Testing Supplies for QAC-QHS accuchecks.  . Insulin Detemir (LEVEMIR FLEXTOUCH) 100 UNIT/ML Pen Inject 20 Units into the skin 2 (two) times daily.  . Insulin Pen Needle 32G X 8 MM MISC Use as directed  . Lancets MISC Use to check blood sugar three times daily. Dx: E11.8  . nitroGLYCERIN (NITROSTAT) 0.4 MG SL tablet Place 1 tablet (0.4 mg total) under the tongue every 5 (five) minutes as needed for chest pain.     Allergies:   Patient has no known allergies.   Social History   Social History  . Marital status: Married    Spouse name: N/A  . Number of children: 3  . Years of education: N/A   Occupational History  . Retired    Social History Main Topics  . Smoking status: Former Smoker    Quit date: 06/09/2009  . Smokeless tobacco: Never Used  . Alcohol use No  . Drug use: No  . Sexual activity: Not Currently   Other Topics Concern  . None   Social History Narrative   Marital: married.      Lives: with son,wife.       Children: 3 children; 6 grandchildren      Employed:  unemployed; disability unknown reason/CVA L sided weakness      Tobacco:  Quit 2012; smoked 40 years.       Alcohol: no drinking now; social in past.       Drugs: none       Exercise: sporadic.       ADLs:  No driving since CVA.     Family History:  The patient's family history includes Diabetes in his father; Heart attack in his father; Hypertension in his father.   ROS:   Please see the history of present illness.    ROS All other systems reviewed and are negative.   EKGs/Labs/Other Test Reviewed:    EKG:  EKG is  ordered today.  The ekg ordered today demonstrates NSR, HR 60, normal axis, ST-T wave abnormality inferolaterally similar to but somewhat more prominent than prior tracings, QTc 428 ms  Recent Labs: 10/11/2016: ALT 20 10/12/2016: Hemoglobin 15.9 10/18/2016: BUN 11; Creatinine, Ser 1.10; Platelets 216; Potassium 5.0; Sodium 140   Recent Lipid Panel    Component Value Date/Time   CHOL 241 (H) 10/11/2016 1156   TRIG 243 (H) 10/11/2016 1156   HDL 35 (L) 10/11/2016 1156   CHOLHDL 6.9 (H) 10/11/2016 1156   CHOLHDL 5 04/20/2015 0951   VLDL 41.4 (H) 04/20/2015 0951   LDLCALC 157 (H) 10/11/2016 1156   LDLDIRECT 101.0 04/20/2015 0951     Physical Exam:    VS:  BP (!) 160/78 (BP Location: Left Arm, Patient Position: Sitting, Cuff Size: Normal)   Pulse 60   Ht '5\' 6"'$  (1.676 m)   Wt 159 lb (72.1 kg)   BMI 25.66 kg/m     Wt Readings from Last 3 Encounters:  10/19/16 159 lb (72.1 kg)  10/18/16 158 lb (71.7 kg)  10/13/16 158 lb 14.4 oz (72.1 kg)     Physical Exam  Constitutional: He is oriented to person, place, and time. He appears well-developed and well-nourished. No distress.  HENT:  Head: Normocephalic and atraumatic.  Eyes: No scleral icterus.  Neck: No JVD present.  Cardiovascular: Normal rate, regular rhythm and normal heart sounds.   No  murmur heard. Pulses:      Dorsalis pedis pulses are 2+ on the right side, and 2+ on the left side.       Posterior  tibial pulses are 2+ on the right side, and 2+ on the left side.  Pulmonary/Chest: Effort normal. He has no wheezes. He has no rales.  Abdominal: Soft. There is no tenderness.  Musculoskeletal: He exhibits no edema.  Neurological: He is alert and oriented to person, place, and time.  Skin: Skin is warm and dry.  Psychiatric: He has a normal mood and affect.    ASSESSMENT:    1. Coronary artery disease involving native coronary artery of native heart without angina pectoris   2. 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[place])   3. Essential hypertension   4. Dyslipidemia, goal LDL below 70   5. Type 2 diabetes mellitus with complication, unspecified long term insulin use status (HCC)    PLAN:    In order of problems listed above:  1. CAD: S/p NSTEMI in 2015 treated with DES to the RCA and subsequent PCI to the LCx 2/2 [place].  Myoview in 2/16 was neg for ischemia. EF mildly decreased on prior Myoview but EF was normal on Echo in 1/17. He was hospitalized in 9/16 with worsening chest pain. LHC demonstrated patent stents to the RCA and LCx and otherwise stable anatomy compared to the films in 2015. Medical Rx was recommended. He has chronic chest pain.  However, he is currently not complaining of chest pain.  Unfortunately, he stopped all of his medications.  He had some rectal bleeding 6 mos ago.  He is currently only on ASA, gabapentin, insulin.  It is > 2 years since his PCI.  Therefore, I think it is ok to continue on ASA alone.  His HR is in the 60s and he likely does not need a beta-blocker at this point (unless his BP does not reach target).  He should remain on high dose statin.  -  Continue ASA 81 mg QD  -  Resume Crestor 40 mg QD.  2. PAD:  Penetrating ulcer left CIA and chronic SMA dissection. Last CTA abdomen with stable findings.  He is followed by [facility].  Repeat CT due in 2019.    3. HTN - BP above target.  Start Lisinopril 10 mg QD.  Check BMET 1 week.  5.Hyperlipidemia: Resume  Crestor.  Arrange follow up Lipids and LFTs at next OV.    6. Diabetes - Uncontrolled. Admitted earlier this month with HONK - A1c 14.  Continue close follow up with PCP for management.     Medication Adjustments/Labs and Tests Ordered: Current medicines are reviewed at length with the patient today.  Concerns regarding medicines are outlined above.  Medication changes, Labs and Tests ordered today are outlined in the Patient Instructions noted below. Patient Instructions  Medication Instructions:  Continue taking Aspirin every day. Start Lisinopril 10 mg Once daily for your blood pressure. Start Crestor (Rosuvastatin) 40 mg Once daily for your cholesterol.  Labwork: 1 week - BMET 10/28/16 lab open from 7:30 am to 4:30 pm ; come in any time that day   Testing/Procedures: None   Follow-Up: Richardson Dopp, PA-C 01/18/17 @ 1:45   Any Other Special Instructions Will Be Listed Below (If Applicable).  If you need a refill on your cardiac medications before your next appointment, please call your pharmacy.   Signed, Richardson Dopp, PA-C  10/19/2016 2:42 PM    [facility]  [address], [place], [place]  [address] Phone: [phone]; Fax: [phone]

## 2016-10-25 ENCOUNTER — Telehealth: Payer: Self-pay

## 2016-10-25 NOTE — Telephone Encounter (Signed)
Prior auth for Rosuvastatin 40mg  submitted to [facility].

## 2016-10-26 ENCOUNTER — Encounter: Payer: Self-pay | Admitting: Family Medicine

## 2016-10-26 ENCOUNTER — Ambulatory Visit (INDEPENDENT_AMBULATORY_CARE_PROVIDER_SITE_OTHER): Payer: Medicare Other | Admitting: Family Medicine

## 2016-10-26 VITALS — BP 130/82 | HR 65 | Temp 98.6°F | Ht 66.0 in | Wt 158.0 lb

## 2016-10-26 DIAGNOSIS — Z91199 Patient's noncompliance with other medical treatment and regimen due to unspecified reason: Secondary | ICD-10-CM

## 2016-10-26 DIAGNOSIS — Z789 Other specified health status: Secondary | ICD-10-CM | POA: Diagnosis not present

## 2016-10-26 DIAGNOSIS — Z794 Long term (current) use of insulin: Secondary | ICD-10-CM

## 2016-10-26 DIAGNOSIS — IMO0001 Reserved for inherently not codable concepts without codable children: Secondary | ICD-10-CM

## 2016-10-26 DIAGNOSIS — I25119 Atherosclerotic heart disease of native coronary artery with unspecified angina pectoris: Secondary | ICD-10-CM

## 2016-10-26 DIAGNOSIS — E1165 Type 2 diabetes mellitus with hyperglycemia: Secondary | ICD-10-CM

## 2016-10-26 DIAGNOSIS — Z9119 Patient's noncompliance with other medical treatment and regimen: Secondary | ICD-10-CM

## 2016-10-26 DIAGNOSIS — I10 Essential (primary) hypertension: Secondary | ICD-10-CM | POA: Diagnosis not present

## 2016-10-26 LAB — GLUCOSE, POCT (MANUAL RESULT ENTRY): POC Glucose: 118 mg/dl — AB (ref 70–99)

## 2016-10-26 MED ORDER — METFORMIN HCL 500 MG PO TABS: 500.0000 mg | ORAL_TABLET | Freq: Two times a day (BID) | ORAL | 3 refills | Status: DC

## 2016-10-26 NOTE — Patient Instructions (Addendum)
It was good to see you today- it looks like you are making progress with your blood sugars.   Our goal is to get your fasting blood sugars to between 100 and 150 Continue your insulin injections at your current dose I am also going to add metformin to help control your diabetes- start with 1 pill a day.  After a week increase to 1 pill twice a day Let me know if your sugars are going over 400 again.    Otherwise let's recheck in 1 month  Please bring ALL your medications to your next visit [place] we can make sure we have the same medication list

## 2016-10-26 NOTE — Progress Notes (Signed)
[facility] at [facility] [address] [place], [place] [address] [phone]  Date:  10/26/2016   Name:  Barry [name]   DOB:  November 01, 1950   MRN:  [id number]  PCP:  Lamar Blinks, MD    Chief Complaint: Hospitalization Follow-up (elevated blood sugar)   History of Present Illness:  Tyleek Machamer is a 66 y.o. very pleasant male patient who presents with the following:  I last saw this pt in January of 2017.  He was admitted from 1/16 to 1/18 with hyperosmolar nonketoic hyperglycemia.  He had been off his insulin for about 6 months and had a glucose of nearly 1000- Dr. Carlota Raspberry sent him to the hospital from [facility].  He has mild acute kidney injury thought due to pre-renal azotemia at that time which is now resolved Her was then seen by Dr. Carlota Raspberry for hospital follow-up on 1/23- he was told to increase his levemir to 22 BID and continue metformin  Reviewed records from inpt team and dr. Carlota Raspberry today  He is taking levemir 22 units twice a day but it is not clear if he is taking metformin  He brings in a log of his recent glucose readings.  Recent readings are 185, 157, 196, 157, 158, 165, 107, 134, 141, 171, 274, 197, 427 (outlier- yesterday), 112 today He is trying to eat fewer carbs, eating more salads He is set up for diabetes classes- they will have an interpreter for him as he does not speak English  It does not seem that they did not actually fill his metformin rx - his daughter and son do not think he is actually taking it GFR is 35- ok to go on metformin   Encouraged a flu shot- his son and daughter decline as "lots of people have told us not to get it" He is taking lisinopril They have not noted any low blood sugars but do note that their dad complains of feeling tired a lot He also saw Richardson Dopp of cardiology last week- pt had an MI back in 2015 with DES, negative myoview 10/2014.  They are keeping him on aspirin and crestor  Lab  Results  Component Value Date   HGBA1C 14.0 10/11/2016     Office Visit on 10/18/2016  Component Date Value Ref Range Status  . POC Glucose 10/18/2016 187* 70 - 99 mg/dl Final  . WBC 10/18/2016 6.4  3.4 - 10.8 x10E3/uL Final  . RBC 10/18/2016 5.64  4.14 - 5.80 x10E6/uL Final  . Hemoglobin 10/18/2016 15.9  13.0 - 17.7 g/dL Final  . Hematocrit 10/18/2016 48.0  37.5 - 51.0 % Final  . MCV 10/18/2016 85  79 - 97 fL Final  . MCH 10/18/2016 28.2  26.6 - 33.0 pg Final  . MCHC 10/18/2016 33.1  31.5 - 35.7 g/dL Final  . RDW 10/18/2016 13.0  12.3 - 15.4 % Final  . Platelets 10/18/2016 216  150 - 379 x10E3/uL Final  . Neutrophils 10/18/2016 56  Not Estab. % Final  . Lymphs 10/18/2016 33  Not Estab. % Final  . Monocytes 10/18/2016 7  Not Estab. % Final  . Eos 10/18/2016 3  Not Estab. % Final  . Basos 10/18/2016 0  Not Estab. % Final  . Neutrophils Absolute 10/18/2016 3.6  1.4 - 7.0 x10E3/uL Final  . Lymphocytes Absolute 10/18/2016 2.1  0.7 - 3.1 x10E3/uL Final  . Monocytes Absolute 10/18/2016 0.5  0.1 - 0.9 x10E3/uL Final  .  EOS (ABSOLUTE) 10/18/2016 0.2  0.0 - 0.4 x10E3/uL Final  . Basophils Absolute 10/18/2016 0.0  0.0 - 0.2 x10E3/uL Final  . Immature Granulocytes 10/18/2016 1  Not Estab. % Final  . Immature Grans (Abs) 10/18/2016 0.0  0.0 - 0.1 x10E3/uL Final  . Glucose 10/18/2016 195* 65 - 99 mg/dL Final  . BUN 10/18/2016 11  8 - 27 mg/dL Final  . Creatinine, Ser 10/18/2016 1.10  0.76 - 1.27 mg/dL Final  . GFR calc non Af Amer 10/18/2016 70  >59 mL/min/1.73 Final  . GFR calc Af Amer 10/18/2016 80  >59 mL/min/1.73 Final  . BUN/Creatinine Ratio 10/18/2016 10  10 - 24 Final  . Sodium 10/18/2016 140  134 - 144 mmol/L Final  . Potassium 10/18/2016 5.0  3.5 - 5.2 mmol/L Final  . Chloride 10/18/2016 96  96 - 106 mmol/L Final  . CO2 10/18/2016 29  18 - 29 mmol/L Final  . Calcium 10/18/2016 9.9  8.6 - 10.2 mg/dL Final    Patient Active Problem List   Diagnosis Date Noted  . Type 2 diabetes  mellitus with hyperosmolar nonketotic hyperglycemia ([place]) 10/11/2016  . Chest pain 06/25/2015  . Pain in the chest   . Atherosclerotic peripheral vascular disease with ulceration ([place]) 04/03/2015  . Special screening for malignant neoplasms, colon 05/07/2014  . CAD (coronary artery disease), native coronary artery 03/28/2014  . Aneurysm of iliac artery ([place]) 03/18/2014  . Abdominal pain, unspecified site 03/18/2014  . Syncope in setting of nausea and vomiting 03/07/14 03/08/2014  . Nausea and vomiting 03/07/14 03/08/2014  . Chest pain with moderate risk of acute coronary syndrome 03/07/2014  . CAD- RCA and LCX stents 2015 02/28/2014  . Non compliance with medical treatment 02/28/2014  . Dyslipidemia, goal LDL below 70 02/28/2014  . Bradycardia- beta blocker decreased 02/28/2014  . Acute on chronic diastolic CHF (congestive heart failure), NYHA class 3 - due to NSTEMI 02/28/2014  . Diastolic CHF ([place]) 40/98/1191  . Type 2 diabetes mellitus with manifestations ([place]) 02/27/2014  . TIA (transient ischemic attack) 02/26/2014  . NSTEMI- 02/27/14 02/26/2014  . Pain in limb-Abdomen  12/06/2013  . Weakness-Left arm / Lef 12/06/2013  . Mesenteric artery stenosis ([place]) 12/06/2013  . 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[place]) 12/03/2012  . Hypertension 11/10/2011  . History of CVA in adulthood 11/10/2011  . Language barrier 11/10/2011    Past Medical History:  Diagnosis Date  . CAD (coronary artery disease)    a. LHC (02/26/14):  mLAD 20 (faint L>R collats), mCFX 50, pRCA 95 (plaque rupture) >> PCI:  Xience DES to pRCA (severe tortuosity of R innominate artery >> needs L radial or FA in future);  b. LHC (03/10/14):  CFX 90, oOM 70, pRCA stent patent >> PCI:  Xience DES to mCFX   . Carotid stenosis    a. Carotid US (02/27/14):  Bilateral ICA 1-39%  . Diabetes mellitus   . 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[place])    a. Mesenteric Artery Duplex (7/15):  pSMA chronic dissection with aneurysmal dilation of 1.29  cm ([facility]);  b.  Chest CTA (02/26/14):  IMPRESSION:  1. No aortic dissection or other acute abnormality.  2. 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  3. Stable 17 mm ectasia of the left common iliac artery.  4. Atherosclerosis, including aortoiliac and coronary artery disease.   Marland Kitchen History of echocardiogram 09/2015   Echo 1/17: mod LVH, EF 55%, inf-lat HK, Gr 1 DD, mild LAE  . Hx of cardiovascular  stress test    Lexiscan Myoview (2/16):  Inferior lateral defect c/w thinning vs small prior infarct, no ischemia, EF 49%, Low Risk  . Hx of echocardiogram    a.  Echocardiogram (02/27/14):  Mod focal basal and mild concentric LVH, EF 50-55%, no RWMA, Gr 1 DD, mild TR, normal RVF  . Hyperlipidemia   . Hypertension   . Iliac artery aneurysm, left ([place])   . Myocardial infarction   . Stroke [facility])     Past Surgical History:  Procedure Laterality Date  . Admission  09/26/2009   CVA.  [name].  Marland Kitchen CARDIAC CATHETERIZATION    . CARDIAC CATHETERIZATION N/A 06/25/2015   Procedure: Left Heart Cath and Coronary Angiography;  Surgeon: Burnell Blanks, MD;  Location: [place] CV LAB;  Service: Cardiovascular;  Laterality: N/A;  . COLONOSCOPY WITH PROPOFOL N/A 07/23/2015   Procedure: COLONOSCOPY WITH PROPOFOL;  Surgeon: [name], MD;  Location: [facility] ENDOSCOPY;  Service: Endoscopy;  Laterality: N/A;  . CORONARY ANGIOPLASTY    . LEFT HEART CATHETERIZATION WITH CORONARY ANGIOGRAM N/A 02/26/2014   Procedure: LEFT HEART CATHETERIZATION WITH CORONARY ANGIOGRAM;  Surgeon: Wellington Hampshire, MD;  Location: [place] CATH LAB;  Service: Cardiovascular;  Laterality: N/A;  . LEFT HEART CATHETERIZATION WITH CORONARY ANGIOGRAM N/A 03/10/2014   Procedure: LEFT HEART CATHETERIZATION WITH CORONARY ANGIOGRAM;  Surgeon: Jettie Booze, MD;  Location: [facility] CATH LAB;  Service: Cardiovascular;  Laterality: N/A;    Social History  Substance Use Topics  . Smoking status: Former Smoker    Quit date: 06/09/2009  .  Smokeless tobacco: Never Used  . Alcohol use No    Family History  Problem Relation Age of Onset  . Diabetes Father   . Hypertension Father   . Heart attack Father   . Stroke Neg Hx     No Known Allergies  Medication list has been reviewed and updated.  Current Outpatient Prescriptions on File Prior to Visit  Medication Sig Dispense Refill  . aspirin 81 MG EC tablet Take 81 mg by mouth daily as needed.     . Blood Glucose Monitoring Suppl (ONE TOUCH ULTRA SYSTEM KIT) W/DEVICE KIT 1 kit by Does not apply route once. One touch delica lancets, and meter Pt will test once daily. Dx 250.92 1 each 0  . gabapentin (NEURONTIN) 300 MG capsule TAKE ONE CAPSULE BY MOUTH THREE TIMES DAILY AND  2  TABS  AS  NEEDED  AT  NIGHT  FOR  LEG  PAIN 120 capsule 0  . glucose blood (ONE TOUCH ULTRA TEST) test strip Test blood sugar 3 times a day. Dx code: E11.8 100 each 10  . glucose monitoring kit (FREESTYLE) monitoring kit 1 each by Does not apply route 4 (four) times daily - after meals and at bedtime. 1 month Diabetic Testing Supplies for QAC-QHS accuchecks. 1 each 1  . Insulin Detemir (LEVEMIR FLEXTOUCH) 100 UNIT/ML Pen Inject 20 Units into the skin 2 (two) times daily. 9 mL 0  . Insulin Pen Needle 32G X 8 MM MISC Use as directed 100 each 0  . Lancets MISC Use to check blood sugar three times daily. Dx: E11.8 300 each 3  . lisinopril (PRINIVIL,ZESTRIL) 10 MG tablet Take 1 tablet (10 mg total) by mouth daily. 30 tablet 11  . nitroGLYCERIN (NITROSTAT) 0.4 MG SL tablet Place 1 tablet (0.4 mg total) under the tongue every 5 (five) minutes as needed for chest pain. 25 tablet 1  . rosuvastatin (CRESTOR)  40 MG tablet Take 1 tablet (40 mg total) by mouth daily. 30 tablet 11   No current facility-administered medications on file prior to visit.     Review of Systems:  As per HPI- otherwise negative.   Physical Examination: Vitals:   10/26/16 1520  BP: 130/82  Pulse: 65  Temp: 98.6 F (37 C)    Vitals:   10/26/16 1520  Weight: 158 lb (71.7 kg)  Height: 5' 6"  (1.676 m)   Body mass index is 25.5 kg/m. Ideal Body Weight: Weight in (lb) to have BMI = 25: 154.6  GEN: WDWN, NAD, Non-toxic, A & O x 3, well appearing man who appears stated age 20: Atraumatic, Normocephalic. Neck supple. No masses, No LAD. Ears and Nose: No external deformity. CV: RRR, No M/G/R. No JVD. No thrill. No extra heart sounds. PULM: CTA B, no wheezes, crackles, rhonchi. No retractions. No resp. distress. No accessory muscle use. EXTR: No c/c/e NEURO Normal gait.  PSYCH: Normally interactive. Conversant. Not depressed or anxious appearing.  Calm demeanor.   Results for orders placed or performed in visit on 10/26/16  POCT Glucose (CBG)  Result Value Ref Range   POC Glucose 118 (A) 70 - 99 mg/dl    Assessment and Plan: Uncontrolled type 2 diabetes mellitus without complication, with long-term current use of insulin ([place]) - Plan: metFORMIN (GLUCOPHAGE) 500 MG tablet, POCT Glucose (CBG)  Essential hypertension  Coronary artery disease involving native coronary artery of native heart with angina pectoris (HCC)  Language barrier  Non compliance with medical treatment  Here today to re-establish care for me for his DM Unfortunately he had stopped his medications and got quite ill earlier this month, was hospitalized with HONK.   Situation is complicated by language barrier.  His children do not know his exact medications and did not bring meds with them, and actively dissuade their father from getting a flu shot.   In any case it does not seem that he ever filled metformin from the hospital Hipp I will re-rx this today He will be attending diabetes classes soon which is good.  Educated family about blood glucoses goals, and the need to bring medications to visit Asked them to see me in one month- sooner if any problems.    Signed Lamar Blinks, MD

## 2016-10-26 NOTE — Progress Notes (Signed)
Pre visit review using our clinic review tool, if applicable. No additional management support is needed unless otherwise documented below in the visit note. 

## 2016-10-28 ENCOUNTER — Other Ambulatory Visit: Payer: Medicare Other | Admitting: *Deleted

## 2016-10-28 ENCOUNTER — Telehealth: Payer: Self-pay | Admitting: *Deleted

## 2016-10-28 DIAGNOSIS — I10 Essential (primary) hypertension: Secondary | ICD-10-CM

## 2016-10-28 LAB — BASIC METABOLIC PANEL
BUN/Creatinine Ratio: 10 (ref 10–24)
BUN: 12 mg/dL (ref 8–27)
CO2: 25 mmol/L (ref 18–29)
Calcium: 9.4 mg/dL (ref 8.6–10.2)
Chloride: 100 mmol/L (ref 96–106)
Creatinine, Ser: 1.15 mg/dL (ref 0.76–1.27)
GFR calc Af Amer: 76 mL/min/{1.73_m2} (ref >59)
GFR calc non Af Amer: 66 mL/min/{1.73_m2} (ref >59)
Glucose: 197 mg/dL — ABNORMAL HIGH (ref 65–99)
Potassium: 3.8 mmol/L (ref 3.5–5.2)
Sodium: 141 mmol/L (ref 134–144)

## 2016-10-28 NOTE — Telephone Encounter (Signed)
DPR for pt's son Pros who has been notified of lab results and findings of elevated glucose. Son advised to have pt f/u with PCP for diabetes. Son verbalized understanding to plan of care for the pt.

## 2016-11-10 ENCOUNTER — Ambulatory Visit: Payer: Medicare Other | Admitting: Dietician

## 2016-11-16 ENCOUNTER — Telehealth: Payer: Self-pay | Admitting: Physician Assistant

## 2016-11-16 ENCOUNTER — Telehealth: Payer: Self-pay | Admitting: Family Medicine

## 2016-11-16 NOTE — Telephone Encounter (Signed)
Caller name: Pros Relationship to patient: Son Can be reached: [phone] Pharmacy:  Reason for call: Son called because patient is staying constipated. States he wants to know if they can give patient a laxative. Plse adv.

## 2016-11-16 NOTE — Telephone Encounter (Signed)
Called him back -suggested miralax OTC,  They will let me know if not helpful

## 2016-11-16 NOTE — Telephone Encounter (Signed)
New Message  Pt c/o medication issue:  1. Name of Medication: lisinopril 10 mg tablet once daily  2. How are you currently taking this medication (dosage and times per day)? See above  3. Are you having a reaction (difficulty breathing--STAT)? N/A  4. What is your medication issue? Pharmacy calling to confirm which dosage, 10 mg or 5 mg due to both being sent.

## 2016-11-16 NOTE — Telephone Encounter (Signed)
New Message:   Need to know who is actually managing pt's Lisinopril. He have prescriptions from Richardson Dopp and his primary doctor.

## 2016-11-16 NOTE — Telephone Encounter (Signed)
I s/w Pharmacist at [facility] and verified Lisinopril dose is 10 mg daily.

## 2016-11-16 NOTE — Telephone Encounter (Signed)
This is a duplicate call.... I s/w Pharmacist at [facility] and verified Lisinopril dose is 10 mg daily.

## 2016-11-17 ENCOUNTER — Encounter (HOSPITAL_COMMUNITY): Payer: Self-pay | Admitting: Emergency Medicine

## 2016-11-17 ENCOUNTER — Emergency Department (HOSPITAL_COMMUNITY): Payer: Medicare Other

## 2016-11-17 ENCOUNTER — Emergency Department (HOSPITAL_COMMUNITY)
Admission: EM | Admit: 2016-11-17 | Discharge: 2016-11-17 | Disposition: A | Discharge status: Home / Self Care | Payer: Medicare Other | Attending: Emergency Medicine | Admitting: Emergency Medicine

## 2016-11-17 DIAGNOSIS — Z79899 Other long term (current) drug therapy: Secondary | ICD-10-CM | POA: Diagnosis not present

## 2016-11-17 DIAGNOSIS — I110 Hypertensive heart disease with heart failure: Secondary | ICD-10-CM | POA: Diagnosis not present

## 2016-11-17 DIAGNOSIS — E11649 Type 2 diabetes mellitus with hypoglycemia without coma: Secondary | ICD-10-CM | POA: Diagnosis not present

## 2016-11-17 DIAGNOSIS — Z87891 Personal history of nicotine dependence: Secondary | ICD-10-CM | POA: Insufficient documentation

## 2016-11-17 DIAGNOSIS — I2510 Atherosclerotic heart disease of native coronary artery without angina pectoris: Secondary | ICD-10-CM | POA: Diagnosis not present

## 2016-11-17 DIAGNOSIS — Z955 Presence of coronary angioplasty implant and graft: Secondary | ICD-10-CM | POA: Insufficient documentation

## 2016-11-17 DIAGNOSIS — Z794 Long term (current) use of insulin: Secondary | ICD-10-CM | POA: Insufficient documentation

## 2016-11-17 DIAGNOSIS — I5033 Acute on chronic diastolic (congestive) heart failure: Secondary | ICD-10-CM | POA: Diagnosis not present

## 2016-11-17 DIAGNOSIS — E162 Hypoglycemia, unspecified: Secondary | ICD-10-CM

## 2016-11-17 DIAGNOSIS — I252 Old myocardial infarction: Secondary | ICD-10-CM | POA: Diagnosis not present

## 2016-11-17 DIAGNOSIS — R112 Nausea with vomiting, unspecified: Secondary | ICD-10-CM | POA: Diagnosis not present

## 2016-11-17 DIAGNOSIS — R1110 Vomiting, unspecified: Secondary | ICD-10-CM | POA: Diagnosis not present

## 2016-11-17 DIAGNOSIS — Z8673 Personal history of transient ischemic attack (TIA), and cerebral infarction without residual deficits: Secondary | ICD-10-CM | POA: Diagnosis not present

## 2016-11-17 DIAGNOSIS — Z7982 Long term (current) use of aspirin: Secondary | ICD-10-CM | POA: Diagnosis not present

## 2016-11-17 DIAGNOSIS — R5383 Other fatigue: Secondary | ICD-10-CM | POA: Diagnosis not present

## 2016-11-17 DIAGNOSIS — R531 Weakness: Secondary | ICD-10-CM | POA: Diagnosis not present

## 2016-11-17 DIAGNOSIS — K2970 Gastritis, unspecified, without bleeding: Secondary | ICD-10-CM | POA: Diagnosis not present

## 2016-11-17 LAB — URINALYSIS, ROUTINE W REFLEX MICROSCOPIC
Bacteria, UA: NONE SEEN
Bilirubin Urine: NEGATIVE
Glucose, UA: 50 mg/dL — AB
Ketones, ur: NEGATIVE mg/dL
Leukocytes, UA: NEGATIVE
Nitrite: NEGATIVE
Protein, ur: NEGATIVE mg/dL
Specific Gravity, Urine: 1.013 (ref 1.005–1.030)
pH: 9 — ABNORMAL HIGH (ref 5.0–8.0)

## 2016-11-17 LAB — COMPREHENSIVE METABOLIC PANEL
ALT: 15 U/L — ABNORMAL LOW (ref 17–63)
AST: 21 U/L (ref 15–41)
Albumin: 3.4 g/dL — ABNORMAL LOW (ref 3.5–5.0)
Alkaline Phosphatase: 85 U/L (ref 38–126)
Anion gap: 7 (ref 5–15)
BUN: 11 mg/dL (ref 6–20)
CO2: 32 mmol/L (ref 22–32)
Calcium: 9.6 mg/dL (ref 8.9–10.3)
Chloride: 104 mmol/L (ref 101–111)
Creatinine, Ser: 1.06 mg/dL (ref 0.61–1.24)
GFR calc Af Amer: >60 mL/min (ref >60)
GFR calc non Af Amer: >60 mL/min (ref >60)
Glucose, Bld: 88 mg/dL (ref 65–99)
Potassium: 3.6 mmol/L (ref 3.5–5.1)
Sodium: 143 mmol/L (ref 135–145)
Total Bilirubin: 0.4 mg/dL (ref 0.3–1.2)
Total Protein: 7.1 g/dL (ref 6.5–8.1)

## 2016-11-17 LAB — CBC
HCT: 44.7 % (ref 39.0–52.0)
Hemoglobin: 14.6 g/dL (ref 13.0–17.0)
MCH: 28 pg (ref 26.0–34.0)
MCHC: 32.7 g/dL (ref 30.0–36.0)
MCV: 85.6 fL (ref 78.0–100.0)
Platelets: 211 10*3/uL (ref 150–400)
RBC: 5.22 MIL/uL (ref 4.22–5.81)
RDW: 12.8 % (ref 11.5–15.5)
WBC: 11.6 10*3/uL — ABNORMAL HIGH (ref 4.0–10.5)

## 2016-11-17 LAB — LIPASE, BLOOD: Lipase: 57 U/L — ABNORMAL HIGH (ref 11–51)

## 2016-11-17 LAB — CBG MONITORING, ED
Glucose-Capillary: 113 mg/dL — ABNORMAL HIGH (ref 65–99)
Glucose-Capillary: 109 mg/dL — ABNORMAL HIGH (ref 65–99)

## 2016-11-17 LAB — I-STAT CG4 LACTIC ACID, ED: Lactic Acid, Venous: 1.4 mmol/L (ref 0.5–1.9)

## 2016-11-17 MED ORDER — ONDANSETRON HCL 4 MG PO TABS: 4.0000 mg | ORAL_TABLET | Freq: Three times a day (TID) | ORAL | 0 refills | Status: DC | PRN

## 2016-11-17 MED ORDER — SODIUM CHLORIDE 0.9 % IV BOLUS (SEPSIS)
1000.0000 mL | Freq: Once | INTRAVENOUS | Status: AC
  Administered 2016-11-17: 1000 mL via INTRAVENOUS

## 2016-11-17 MED ORDER — ONDANSETRON 4 MG PO TBDP: 4.0000 mg | ORAL_TABLET | Freq: Once | ORAL | Status: DC | PRN

## 2016-11-17 NOTE — Discharge Instructions (Signed)
Please take your nausea medicine to stay hydrated and eat enough food. Your  workup today did not show evidence of infection or significant electrolyte abnormality. You likely used your insulin glucose medicine on an empty stomach causing your sugar to be low. This is why he felt that today. Schedule a follow-up appointment with your primary care physician for further management of your sugars. If any symptoms return or worsen, please return to the nearest emergency room.

## 2016-11-17 NOTE — ED Triage Notes (Signed)
Pt to ED via [facility] from home with c/o nausea/weakness/ hypoglycemia-- pt's initial cbg on ems arrival= 56-- given 50gm glucose per EMS and peanut butter crackers. Repeat cbg = 72. On arrival pt states he took his insulin this am but did not eat much due to abd pain and "Not feeling well" .  Pt's primary language=[place]-- does speak some [place].

## 2016-11-17 NOTE — ED Provider Notes (Signed)
[facility] DEPT Provider Note   CSN: [id number] Arrival date & time: 11/17/16  1123     History   Chief Complaint Chief Complaint  Patient presents with  . Hypoglycemia  . Abdominal Pain    HPI Barry [name] is a 66 y.o. male    The history is provided by the patient, medical records and a relative. The history is limited by a language barrier. A language interpreter was used.  Emesis   This is a new problem. The current episode started 3 to 5 hours ago. The problem occurs 2 to 4 times per day. The problem has been gradually improving. There has been no fever. Pertinent negatives include no abdominal pain, no chills, no cough, no diarrhea, no fever, no headaches, no sweats and no URI.  Hypoglycemia  Initial blood sugar:  56 Blood sugar after intervention:  Normal Severity:  Moderate Onset quality:  Gradual Timing:  Intermittent Progression:  Improving Chronicity:  New Diabetic status:  Controlled with insulin Context: decreased oral intake and recent illness (recent N/V and deacresed PO intake over several days)   Relieved by:  Eating Ineffective treatments:  None tried Associated symptoms: vomiting   Associated symptoms: no altered mental status, no dizziness, no seizures, no shortness of breath, no sweats and no weakness   Associated symptoms comment:  Fatigue    Past Medical History:  Diagnosis Date  . CAD (coronary artery disease)    a. LHC (02/26/14):  mLAD 20 (faint L>R collats), mCFX 50, pRCA 95 (plaque rupture) >> PCI:  Xience DES to pRCA (severe tortuosity of R innominate artery >> needs L radial or FA in future);  b. LHC (03/10/14):  CFX 90, oOM 70, pRCA stent patent >> PCI:  Xience DES to mCFX   . Carotid stenosis    a. Carotid US (02/27/14):  Bilateral ICA 1-39%  . Diabetes mellitus   . 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[place])    a. Mesenteric Artery Duplex (7/15):  pSMA chronic dissection with aneurysmal dilation of 1.29 cm (VVS);  b.  Chest CTA (02/26/14):   IMPRESSION:  1. No aortic dissection or other acute abnormality.  2. 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  3. Stable 17 mm ectasia of the left common iliac artery.  4. Atherosclerosis, including aortoiliac and coronary artery disease.   Marland Kitchen History of echocardiogram 09/2015   Echo 1/17: mod LVH, EF 55%, inf-lat HK, Gr 1 DD, mild LAE  . Hx of cardiovascular stress test    Lexiscan Myoview (2/16):  Inferior lateral defect c/w thinning vs small prior infarct, no ischemia, EF 49%, Low Risk  . Hx of echocardiogram    a.  Echocardiogram (02/27/14):  Mod focal basal and mild concentric LVH, EF 50-55%, no RWMA, Gr 1 DD, mild TR, normal RVF  . Hyperlipidemia   . Hypertension   . Iliac artery aneurysm, left ([place])   . Myocardial infarction   . Stroke [facility])     Patient Active Problem List   Diagnosis Date Noted  . Type 2 diabetes mellitus with hyperosmolar nonketotic hyperglycemia ([place]) 10/11/2016  . Chest pain 06/25/2015  . Pain in the chest   . Atherosclerotic peripheral vascular disease with ulceration ([place]) 04/03/2015  . Special screening for malignant neoplasms, colon 05/07/2014  . CAD (coronary artery disease), native coronary artery 03/28/2014  . Aneurysm of iliac artery ([place]) 03/18/2014  . Abdominal pain, unspecified site 03/18/2014  . Syncope in setting of nausea and vomiting 03/07/14 03/08/2014  . Nausea and  vomiting 03/07/14 03/08/2014  . Chest pain with moderate risk of acute coronary syndrome 03/07/2014  . CAD- RCA and LCX stents 2015 02/28/2014  . Non compliance with medical treatment 02/28/2014  . Dyslipidemia, goal LDL below 70 02/28/2014  . Bradycardia- beta blocker decreased 02/28/2014  . Acute on chronic diastolic CHF (congestive heart failure), NYHA class 3 - due to NSTEMI 02/28/2014  . Diastolic CHF ([place]) 85/88/5027  . Type 2 diabetes mellitus with manifestations ([place]) 02/27/2014  . TIA (transient ischemic attack) 02/26/2014  . NSTEMI- 02/27/14 02/26/2014  . Pain  in limb-Abdomen  12/06/2013  . Weakness-Left arm / Lef 12/06/2013  . Mesenteric artery stenosis ([place]) 12/06/2013  . 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[place]) 12/03/2012  . Hypertension 11/10/2011  . History of CVA in adulthood 11/10/2011  . Language barrier 11/10/2011    Past Surgical History:  Procedure Laterality Date  . Admission  09/26/2009   CVA.  [name].  Marland Kitchen CARDIAC CATHETERIZATION    . CARDIAC CATHETERIZATION N/A 06/25/2015   Procedure: Left Heart Cath and Coronary Angiography;  Surgeon: Burnell Blanks, MD;  Location: [facility] CV LAB;  Service: Cardiovascular;  Laterality: N/A;  . COLONOSCOPY WITH PROPOFOL N/A 07/23/2015   Procedure: COLONOSCOPY WITH PROPOFOL;  Surgeon: [name], MD;  Location: [facility] ENDOSCOPY;  Service: Endoscopy;  Laterality: N/A;  . CORONARY ANGIOPLASTY    . LEFT HEART CATHETERIZATION WITH CORONARY ANGIOGRAM N/A 02/26/2014   Procedure: LEFT HEART CATHETERIZATION WITH CORONARY ANGIOGRAM;  Surgeon: Wellington Hampshire, MD;  Location: [facility] CATH LAB;  Service: Cardiovascular;  Laterality: N/A;  . LEFT HEART CATHETERIZATION WITH CORONARY ANGIOGRAM N/A 03/10/2014   Procedure: LEFT HEART CATHETERIZATION WITH CORONARY ANGIOGRAM;  Surgeon: Jettie Booze, MD;  Location: [facility] CATH LAB;  Service: Cardiovascular;  Laterality: N/A;       Home Medications    Prior to Admission medications   Medication Sig Start Date End Date Taking? Authorizing Provider  aspirin 81 MG EC tablet Take 81 mg by mouth daily as needed.  02/12/15   Larey Dresser, MD  Blood Glucose Monitoring Suppl (ONE TOUCH ULTRA SYSTEM KIT) W/DEVICE KIT 1 kit by Does not apply route once. One touch delica lancets, and meter Pt will test once daily. Dx 250.92 04/25/14   [name] Mons, PA-C  gabapentin (NEURONTIN) 300 MG capsule TAKE ONE CAPSULE BY MOUTH THREE TIMES DAILY AND  2  TABS  AS  NEEDED  AT  NIGHT  FOR  LEG  PAIN 10/11/16   Wendie Agreste, MD  glucose blood (ONE TOUCH ULTRA TEST) test  strip Test blood sugar 3 times a day. Dx code: E11.8 10/11/16   Wendie Agreste, MD  glucose monitoring kit (FREESTYLE) monitoring kit 1 each by Does not apply route 4 (four) times daily - after meals and at bedtime. 1 month Diabetic Testing Supplies for QAC-QHS accuchecks. 10/13/16   Shanker Kristeen Mans, MD  Insulin Detemir (LEVEMIR FLEXTOUCH) 100 UNIT/ML Pen Inject 20 Units into the skin 2 (two) times daily. 10/13/16   Shanker Kristeen Mans, MD  Insulin Pen Needle 32G X 8 MM MISC Use as directed 10/13/16   Jonetta Osgood, MD  Lancets MISC Use to check blood sugar three times daily. Dx: E11.8 10/11/16   Wendie Agreste, MD  lisinopril (PRINIVIL,ZESTRIL) 10 MG tablet Take 1 tablet (10 mg total) by mouth daily. 10/19/16   Liliane Shi, PA-C  metFORMIN (GLUCOPHAGE) 500 MG tablet Take 1 tablet (500 mg total) by mouth  2 (two) times daily with a meal. 10/26/16   Gay Filler Copland, MD  nitroGLYCERIN (NITROSTAT) 0.4 MG SL tablet Place 1 tablet (0.4 mg total) under the tongue every 5 (five) minutes as needed for chest pain. 10/18/16   Wendie Agreste, MD  rosuvastatin (CRESTOR) 40 MG tablet Take 1 tablet (40 mg total) by mouth daily. 10/19/16 10/14/17  Liliane Shi, PA-C    Family History Family History  Problem Relation Age of Onset  . Diabetes Father   . Hypertension Father   . Heart attack Father   . Stroke Neg Hx     Social History Social History  Substance Use Topics  . Smoking status: Former Smoker    Quit date: 06/09/2009  . Smokeless tobacco: Never Used  . Alcohol use No     Allergies   Patient has no known allergies.   Review of Systems Review of Systems  Constitutional: Positive for appetite change (deacreased PO) and fatigue. Negative for activity change, chills, diaphoresis and fever.  HENT: Negative for congestion and rhinorrhea.   Eyes: Negative for visual disturbance.  Respiratory: Negative for cough, chest tightness, shortness of breath, wheezing and stridor.     Cardiovascular: Negative for chest pain, palpitations and leg swelling.  Gastrointestinal: Positive for constipation (last week, normal BM yesterday), nausea and vomiting. Negative for abdominal distention, abdominal pain, blood in stool and diarrhea.  Genitourinary: Negative for difficulty urinating, dysuria, flank pain and frequency.  Musculoskeletal: Negative for back pain, gait problem, neck pain and neck stiffness.  Skin: Negative for rash and wound.  Neurological: Positive for light-headedness. Negative for dizziness, seizures, weakness, numbness and headaches.  Psychiatric/Behavioral: Negative for agitation.  All other systems reviewed and are negative.    Physical Exam Updated Vital Signs BP 160/90 (BP Location: Right Arm)   Pulse 80   Temp 98.3 F (36.8 C) (Oral)   Resp 18   Ht 5' 1.02" (1.55 m)   Wt 145 lb (65.8 kg)   SpO2 100%   BMI 27.38 kg/m   Physical Exam  Constitutional: He is oriented to person, place, and time. He appears well-developed and well-nourished. No distress.  HENT:  Head: Normocephalic and atraumatic.  Right Ear: External ear normal.  Left Ear: External ear normal.  Nose: Nose normal.  Mouth/Throat: Oropharynx is clear and moist. No oropharyngeal exudate.  Eyes: Conjunctivae and EOM are normal. Pupils are equal, round, and reactive to light.  Neck: Normal range of motion. Neck supple.  Cardiovascular: Normal rate, regular rhythm, normal heart sounds and intact distal pulses.   No murmur heard. Pulmonary/Chest: Effort normal and breath sounds normal. No stridor. No respiratory distress. He has no wheezes. He has no rales. He exhibits no tenderness.  Abdominal: Soft. He exhibits no mass. There is no tenderness. There is no rebound and no guarding.  Musculoskeletal: He exhibits no edema or tenderness.  Neurological: He is alert and oriented to person, place, and time. He displays normal reflexes. No cranial nerve deficit. He exhibits normal muscle  tone. Coordination normal.  Skin: Skin is warm. Capillary refill takes less than 2 seconds. No rash noted. He is not diaphoretic. No erythema. No pallor.  Psychiatric: He has a normal mood and affect.     ED Treatments / Results  Labs (all labs ordered are listed, but only abnormal results are displayed) Labs Reviewed  LIPASE, BLOOD - Abnormal; Notable for the following:       Result Value   Lipase 57 (*)  All other components within normal limits  COMPREHENSIVE METABOLIC PANEL - Abnormal; Notable for the following:    Albumin 3.4 (*)    ALT 15 (*)    All other components within normal limits  CBC - Abnormal; Notable for the following:    WBC 11.6 (*)    All other components within normal limits  URINALYSIS, ROUTINE W REFLEX MICROSCOPIC - Abnormal; Notable for the following:    pH 9.0 (*)    Glucose, UA 50 (*)    Hgb urine dipstick SMALL (*)    Squamous Epithelial / LPF 0-5 (*)    All other components within normal limits  CBG MONITORING, ED - Abnormal; Notable for the following:    Glucose-Capillary 113 (*)    All other components within normal limits  CBG MONITORING, ED - Abnormal; Notable for the following:    Glucose-Capillary 109 (*)    All other components within normal limits  URINE CULTURE  I-STAT CG4 LACTIC ACID, ED  I-STAT CG4 LACTIC ACID, ED    EKG  EKG Interpretation None       Radiology Dg Chest 2 View  Result Date: 11/17/2016 CLINICAL DATA:  Fatigue and lightheadedness beginning today. EXAM: CHEST  2 VIEW COMPARISON:  PA and lateral chest 06/24/2015 and 10/13/2014. FINDINGS: Lungs are clear. Heart size is normal. No pneumothorax or pleural fluid. No focal bony abnormality. IMPRESSION: No acute disease. Electronically Signed   By: Inge Rise M.D.   On: 11/17/2016 13:32    Procedures Procedures (including critical care time)  Medications Ordered in ED Medications  sodium chloride 0.9 % bolus 1,000 mL (0 mLs Intravenous Stopped 11/17/16 1635)     Zofran in triage.    Initial Impression / Assessment and Plan / ED Course  I have reviewed the triage vital signs and the nursing notes.  Pertinent labs & imaging results that were available during my care of the patient were reviewed by me and considered in my medical decision making (see chart for details).     Barry [name] is a 66 y.o. male with past medical history significant for CAD, hypertension, diabetes, stroke, and mesenteric artery dissection and iliac artery aneurysm who presents with nausea and vomiting. Patient reports that he is nausea and vomiting for the last few days. He says that several weeks ago, he was admitted for hyperglycemia and was started on insulin at that time. Patient said that with his recent nausea and vomiting, he has had less to eat. He says that this morning, he continued to feel bad and felt lightheaded. He checked his sugar and it was low he can the for evaluation. On arrival, glucose was in the 50s. Patient was given juice which improved glucose.  Patient given nausea medications and fluids for rehydration. Patient had workup including laboratory testing to look for occult infection orally for abnormality. Results are seen above.  No evidence of urinary tract infection, lipase slightly elevated however, do not feel patient has acute pancreatic Titus. CBC shows slight leukocytosis which may be related to the patient's nausea vomiting. Glucose was improved while in the ED. Lactic acid nonelevated. Patient had no abdominal pain on exam or during his ED stay. X-ray revealed no pneumonia.  The lack of any abdominal pain during the study extended his stay or prior to coming, do not feel patient has any competition of his prior vascular issues in his abdomen.  Given patient's resolution of his nausea vomiting and fatigue, suspect patient's symptoms were secondary  to hypoglycemia. Patient says that this worsened after he took his insulin medications. Given patient's  decreased by mouth intake with his nausea and vomiting, patient instructed to try and maintain hydration and eating enough before taking his insulin. Patient will follow-up with his PCP for further glucose management strategies. Patient given return precautions for any new or worsening symptoms. Patient had no other locations or problems and patient was discharged in good condition.     Final Clinical Impressions(s) / ED Diagnoses   Final diagnoses:  Hypoglycemia  Non-intractable vomiting with nausea, unspecified vomiting type    New Prescriptions Discharge Medication List as of 11/17/2016  4:32 PM    START taking these medications   Details  ondansetron (ZOFRAN) 4 MG tablet Take 1 tablet (4 mg total) by mouth every 8 (eight) hours as needed for nausea or vomiting., Starting Thu 11/17/2016, Print       Clinical Impression: 1. Hypoglycemia   2. Non-intractable vomiting with nausea, unspecified vomiting type     Disposition: Discharge  Condition: Good  I have discussed the results, Dx and Tx plan with the pt(& family if present). He/she/they expressed understanding and agree(s) with the plan. Discharge instructions discussed at great length. Strict return precautions discussed and pt &/or family have verbalized understanding of the instructions. No further questions at time of discharge.    Discharge Medication List as of 11/17/2016  4:32 PM    START taking these medications   Details  ondansetron (ZOFRAN) 4 MG tablet Take 1 tablet (4 mg total) by mouth every 8 (eight) hours as needed for nausea or vomiting., Starting Thu 11/17/2016, Print        Follow Up: Darreld Mclean, MD [address] [place] [address] [phone]     [facility] EMERGENCY DEPARTMENT [address] [id number] [place] [phone]  If symptoms worsen     Courtney Paris, MD 11/18/16 1807

## 2016-11-18 LAB — URINE CULTURE

## 2016-11-25 ENCOUNTER — Encounter: Payer: Medicare Other | Attending: Family Medicine | Admitting: Registered"

## 2016-11-25 DIAGNOSIS — E118 Type 2 diabetes mellitus with unspecified complications: Secondary | ICD-10-CM | POA: Insufficient documentation

## 2016-11-25 NOTE — Patient Instructions (Addendum)
Have daughter help with communicating with pharmacist and/or doctor to make sure you are taking the lisinopril correctly. Consider having oatmeal with nuts for breakfast. Eat less rice and have more fish and vegetables with your meals. Try unsweetened soy or rice milk to get more calcium in diet. Include more nuts and nut butter in diet Eat whole fruit instead of drinking fruit juice. V8 low sodium is fine to drink. Consider increasing your physical activity.

## 2016-11-25 NOTE — Progress Notes (Signed)
Diabetes Self-Management Education  Visit Type: First/Initial  Appt. Start Time: 0940 Appt. End Time: 1100  11/25/2016  Barry [name], identified by name and date of birth, is a 66 y.o. male with a diagnosis of Diabetes: Type 2.   ASSESSMENT This patient is accompanied in the office by his spouse and intepreter. Patient reports he wants to eat what the doctors tell him to eat. Patient had questions about dosing and timing of his insulin. In this visit we covered topics of food and physical activity. Patient made another appointment to return for education on proper insulin use.       Diabetes Self-Management Education - 11/25/16 0957      Visit Information   Visit Type First/Initial     Initial Visit   Diabetes Type Type 2   Are you currently following a meal plan? Yes   Are you taking your medications as prescribed? --  intends to but may be taking too much lisinopril d/t misunderstanding directions. Interpreter will ask daughter to talk with pharmisist   Date Diagnosed long while     Health Coping   How would you rate your overall health? Fair     Psychosocial Assessment   Patient Belief/Attitude about Diabetes Afraid   Self-care barriers Hard of hearing;English as a second language   How often do you need to have someone help you when you read instructions, pamphlets, or other written materials from your doctor or pharmacy? 5 - Always     Complications   How often do you check your blood sugar? 3-4 times/day   Fasting Blood glucose range (mg/dL) --  111 - 171 (this morning)   Number of hypoglycemic episodes per month 1  last week went to hospital   Have you had a dilated eye exam in the past 12 months? Yes   Have you had a dental exam in the past 12 months? No   Are you checking your feet? Yes   How many days per week are you checking your feet? 5     Dietary Intake   Breakfast cracker with PB, coffee little sugar, cream   Snack (morning) none   Lunch rice, soup,  fish   Snack (afternoon) no   Dinner rice, chicken sometimes, fish (3 oz)   Snack (evening) none   Beverage(s) tea, water, coffee in the morning.     Exercise   Exercise Type Light (walking / raking leaves)   How many days per week to you exercise? 6   How many minutes per day do you exercise? 20   Total minutes per week of exercise 120     Patient Education   Previous Diabetes Education --  doesnt know   Disease state  Definition of diabetes, type 1 and 2, and the diagnosis of diabetes   Nutrition management  Role of diet in the treatment of diabetes and the relationship between the three main macronutrients and blood glucose level;Food label reading, portion sizes and measuring food.   Chronic complications Lipid levels, blood glucose control and heart disease;Identified and discussed with patient  current chronic complications     Individualized Goals (developed by patient)   Nutrition General guidelines for healthy choices and portions discussed     Outcomes   Expected Outcomes Demonstrated interest in learning. Expect positive outcomes   Future DMSE PRN   Program Status Completed      Individualized Plan for Diabetes Self-Management Training:   Learning Objective:  Patient will have  a greater understanding of diabetes self-management. Patient education plan is to attend individual and/or group sessions per assessed needs and concerns.   Plan:   Patient Instructions  Have daughter help with communicating with pharmacist and/or doctor to make sure you are taking the lisinopril correctly. Consider having oatmeal with nuts for breakfast. Eat less rice and have more fish and vegetables. Try unsweetened soy or rice milk to get more calcium in diet. Include more nuts and nut butter in diet Eat whole fruit instead of drinking fruit juice. V8 low sodium is fine to drink.    Expected Outcomes:  Demonstrated interest in learning. Expect positive outcomes  Education material  provided: none  If problems or questions, patient to contact team via:  Phone and Email  Future DSME appointment: PRN

## 2016-12-01 ENCOUNTER — Ambulatory Visit: Payer: Medicare Other | Admitting: *Deleted

## 2016-12-01 ENCOUNTER — Encounter (HOSPITAL_COMMUNITY): Payer: Self-pay | Admitting: Emergency Medicine

## 2016-12-01 ENCOUNTER — Emergency Department (HOSPITAL_COMMUNITY)
Admission: EM | Admit: 2016-12-01 | Discharge: 2016-12-01 | Disposition: A | Discharge status: Home / Self Care | Payer: No Typology Code available for payment source | Attending: Emergency Medicine | Admitting: Emergency Medicine

## 2016-12-01 ENCOUNTER — Emergency Department (HOSPITAL_COMMUNITY): Payer: No Typology Code available for payment source

## 2016-12-01 DIAGNOSIS — R079 Chest pain, unspecified: Secondary | ICD-10-CM | POA: Diagnosis not present

## 2016-12-01 DIAGNOSIS — E119 Type 2 diabetes mellitus without complications: Secondary | ICD-10-CM | POA: Diagnosis not present

## 2016-12-01 DIAGNOSIS — I2510 Atherosclerotic heart disease of native coronary artery without angina pectoris: Secondary | ICD-10-CM | POA: Diagnosis not present

## 2016-12-01 DIAGNOSIS — Y92410 Unspecified street and highway as the place of occurrence of the external cause: Secondary | ICD-10-CM | POA: Diagnosis not present

## 2016-12-01 DIAGNOSIS — S299XXA Unspecified injury of thorax, initial encounter: Secondary | ICD-10-CM | POA: Insufficient documentation

## 2016-12-01 DIAGNOSIS — Z79899 Other long term (current) drug therapy: Secondary | ICD-10-CM | POA: Insufficient documentation

## 2016-12-01 DIAGNOSIS — Y939 Activity, unspecified: Secondary | ICD-10-CM | POA: Insufficient documentation

## 2016-12-01 DIAGNOSIS — Z8673 Personal history of transient ischemic attack (TIA), and cerebral infarction without residual deficits: Secondary | ICD-10-CM | POA: Insufficient documentation

## 2016-12-01 DIAGNOSIS — I5033 Acute on chronic diastolic (congestive) heart failure: Secondary | ICD-10-CM | POA: Insufficient documentation

## 2016-12-01 DIAGNOSIS — Z7982 Long term (current) use of aspirin: Secondary | ICD-10-CM | POA: Diagnosis not present

## 2016-12-01 DIAGNOSIS — Z87891 Personal history of nicotine dependence: Secondary | ICD-10-CM | POA: Insufficient documentation

## 2016-12-01 DIAGNOSIS — Z7984 Long term (current) use of oral hypoglycemic drugs: Secondary | ICD-10-CM | POA: Diagnosis not present

## 2016-12-01 DIAGNOSIS — I252 Old myocardial infarction: Secondary | ICD-10-CM | POA: Insufficient documentation

## 2016-12-01 DIAGNOSIS — I110 Hypertensive heart disease with heart failure: Secondary | ICD-10-CM | POA: Diagnosis not present

## 2016-12-01 DIAGNOSIS — Y999 Unspecified external cause status: Secondary | ICD-10-CM | POA: Diagnosis not present

## 2016-12-01 LAB — CBC WITH DIFFERENTIAL/PLATELET
Basophils Absolute: 0 10*3/uL (ref 0.0–0.1)
Basophils Relative: 0 %
Eosinophils Absolute: 0.1 10*3/uL (ref 0.0–0.7)
Eosinophils Relative: 2 %
HCT: 46.7 % (ref 39.0–52.0)
Hemoglobin: 15.1 g/dL (ref 13.0–17.0)
Lymphocytes Relative: 19 %
Lymphs Abs: 1.3 10*3/uL (ref 0.7–4.0)
MCH: 27.8 pg (ref 26.0–34.0)
MCHC: 32.3 g/dL (ref 30.0–36.0)
MCV: 86 fL (ref 78.0–100.0)
Monocytes Absolute: 0.2 10*3/uL (ref 0.1–1.0)
Monocytes Relative: 4 %
Neutro Abs: 4.9 10*3/uL (ref 1.7–7.7)
Neutrophils Relative %: 75 %
Platelets: 173 10*3/uL (ref 150–400)
RBC: 5.43 MIL/uL (ref 4.22–5.81)
RDW: 13.5 % (ref 11.5–15.5)
WBC: 6.6 10*3/uL (ref 4.0–10.5)

## 2016-12-01 LAB — COMPREHENSIVE METABOLIC PANEL
ALT: 18 U/L (ref 17–63)
AST: 26 U/L (ref 15–41)
Albumin: 3.6 g/dL (ref 3.5–5.0)
Alkaline Phosphatase: 82 U/L (ref 38–126)
Anion gap: 8 (ref 5–15)
BUN: 13 mg/dL (ref 6–20)
CO2: 24 mmol/L (ref 22–32)
Calcium: 9.4 mg/dL (ref 8.9–10.3)
Chloride: 105 mmol/L (ref 101–111)
Creatinine, Ser: 1.09 mg/dL (ref 0.61–1.24)
GFR calc Af Amer: >60 mL/min (ref >60)
GFR calc non Af Amer: >60 mL/min (ref >60)
Glucose, Bld: 215 mg/dL — ABNORMAL HIGH (ref 65–99)
Potassium: 3.8 mmol/L (ref 3.5–5.1)
Sodium: 137 mmol/L (ref 135–145)
Total Bilirubin: 0.6 mg/dL (ref 0.3–1.2)
Total Protein: 6.8 g/dL (ref 6.5–8.1)

## 2016-12-01 LAB — I-STAT TROPONIN, ED
Troponin i, poc: 0 ng/mL (ref 0.00–0.08)
Troponin i, poc: 0 ng/mL (ref 0.00–0.08)

## 2016-12-01 LAB — BRAIN NATRIURETIC PEPTIDE: B Natriuretic Peptide: 28.8 pg/mL (ref 0.0–100.0)

## 2016-12-01 NOTE — Discharge Instructions (Signed)
Your work-up today was normal. Follow-up with your primary care doctor.  Also will need to follow-up with your cardiologist if you continue having chest pain. Return here for new/worsening symptoms--- worsening pain, trouble breathing, dizziness, numbness, weakness, confusion, etc.
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
## 2016-12-15 ENCOUNTER — Other Ambulatory Visit: Payer: Self-pay | Admitting: Family Medicine

## 2016-12-17 MED ORDER — INSULIN PEN NEEDLE 32G X 8 MM MISC: 3 refills | Status: DC

## 2016-12-30 ENCOUNTER — Encounter: Payer: Self-pay | Admitting: Physician Assistant

## 2017-01-18 ENCOUNTER — Ambulatory Visit: Payer: Medicare Other | Admitting: Physician Assistant

## 2017-01-18 NOTE — Progress Notes (Deleted)
Cardiology Office Note:    Date:  01/18/2017   ID:  Jerrid Forand, DOB 05-Mar-1951, MRN [id number]  PCP:  Lamar Blinks, MD  Cardiologist:  Dr. Loralie Champagne  >> Dr. Harrell Gave End  Electrophysiologist:  n/a VVS: Dr. Trula Slade  Referring MD: Lorelei Pont Gay Filler, MD   No chief complaint on file. ***  History of Present Illness:    Makar Leitz is a 66 y.o. male with a hx of CAD, chronic superior mesenteric artery dissection, prior stroke, DM2, HTN, CKD, HL. He originally presented in 02/2014 with non-STEMI. LHC demonstrated a ruptured plaque and thrombus in the proximal RCA which was treated with a DES. He was readmitted several weeks later with unstable angina and LHC demonstrated high-grade stenosis in the CFX which was treated with a DES. He was seen 09/2014 and complained of left-sided chest discomfort and dizziness. Lexiscan Myoview was low risk with inferior lateral defect consistent with thinning versus small prior infarct. There was no ischemia. EF was 49%. LHC was arranged 9/16 for recurrent chest pain and demonstrated 2v CAD with patent mid LCx/OM2 stent and patent RCA stent, mod stenosis in a small caliber sub-branch of OM2 jailed by the old stent (unchanged from 2015), mod stenosis in a small to mod caliber ostial PDA (unchanged from 2015). Med Rx was recommended.  Last seen 09/2016.  ***  Prior CV studies:   The following studies were reviewed today:  Abdominal CTA 2/17 IMPRESSION: 1. Stable non flow limiting dissection in the mid SMA, with 14 mm aneurysmal dilatation of the affected segment as before. 2. 17 mm fusiform left common iliac artery aneurysm, stable. 3. Cholelithiasis. 4. Right nephrolithiasis without hydronephrosis.  Echo 10/27/15 Mod LVH, EF 55, inf-lat HK, Gr 1 DD, trivial AI, mild LAE  LHC 9/16 LAD: Ostial 30%, prox 30%, dist 30%, D1 30% LCx: prox 20%, OM2 stent ok with 10% ISR, small lateral OM2 80% RCA: Ostial stent ok with 15%, mid 20%, dist 20%,  ostial RPDA 60% EF 55-65% 1. Double vessel CAD with patent stent mid Circumflex/OM2 and patent stent RCA 2. Moderate stenosis in the small caliber sub-branch of OM2 where it is jailed by the old stent. This is a small caliber branch vessel. Unchanged in appearance from last cath in 2015.  3. Moderate stenosis in the small to moderate caliber ostial PDA, unchanged from last cath in 2015.  4. Normal LV function Recommendations: Continue medical therapy for CAD.   Myoview 2/16 Inferior lateral defect consistent with thinning vs small prior infarct; no ischemia. EF 49%  Past Medical History:  Diagnosis Date  . CAD (coronary artery disease)    a. LHC (02/26/14):  mLAD 20 (faint L>R collats), mCFX 50, pRCA 95 (plaque rupture) >> PCI:  Xience DES to pRCA (severe tortuosity of R innominate artery >> needs L radial or FA in future);  b. LHC (03/10/14):  CFX 90, oOM 70, pRCA stent patent >> PCI:  Xience DES to mCFX   . Carotid stenosis    a. Carotid US (02/27/14):  Bilateral ICA 1-39%  . Diabetes mellitus   . 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[place])    a. Mesenteric Artery Duplex (7/15):  pSMA chronic dissection with aneurysmal dilation of 1.29 cm ([facility]);  b.  Chest CTA (02/26/14):  IMPRESSION:  1. No aortic dissection or other acute abnormality.  2. 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  3. Stable 17 mm ectasia of the left common iliac artery.  4. Atherosclerosis, including aortoiliac and coronary artery  disease.   Marland Kitchen History of echocardiogram 09/2015   Echo 1/17: mod LVH, EF 55%, inf-lat HK, Gr 1 DD, mild LAE  . Hx of cardiovascular stress test    Lexiscan Myoview (2/16):  Inferior lateral defect c/w thinning vs small prior infarct, no ischemia, EF 49%, Low Risk  . Hx of echocardiogram    a.  Echocardiogram (02/27/14):  Mod focal basal and mild concentric LVH, EF 50-55%, no RWMA, Gr 1 DD, mild TR, normal RVF  . Hyperlipidemia   . Hypertension   . Iliac artery aneurysm, left ([place])   .  Myocardial infarction ([place])   . Stroke ([place])   1. HTN 2. Type II diabetes 3. CVA in 2011 4. AAA: 2.2 cm in 3/15.  5. PAD: Left CIA with penetrating ulcer. 6. Chronic superior mesenteric artery dissection, followed by Dr Trula Slade. 7. CKD 8. CAD: NSTEMI 6/15 with 95% proximal RCA stenosis treated with DES. Unstable angina later in 6/15, this time had DES to 90% mLCx. Echo (6/15) with EF 50-55%, moderate focal basal septal hypertrophy, normal RV size and systolic function. Lexiscan Cardiolite (2/16) with EF 49%, small fixed inferolateral defect with no ischemia.  9. Carotid dopplers (6/15) with mild stenosis.  10. Hyperlipidemia: Did not tolerate atorvastatin.  Past Surgical History:  Procedure Laterality Date  . Admission  09/26/2009   CVA.  [name].  Marland Kitchen CARDIAC CATHETERIZATION    . CARDIAC CATHETERIZATION N/A 06/25/2015   Procedure: Left Heart Cath and Coronary Angiography;  Surgeon: Burnell Blanks, MD;  Location: [facility] CV LAB;  Service: Cardiovascular;  Laterality: N/A;  . COLONOSCOPY WITH PROPOFOL N/A 07/23/2015   Procedure: COLONOSCOPY WITH PROPOFOL;  Surgeon: [name], MD;  [facility]OS[facility];  Service: Endoscopy;  Laterality: N/A;  . CORONARY ANGIOPLASTY    . LEFT HEART CATHETERIZATION WITH CORONARY ANGIOGRAM N/A 02/26/2014   Procedure: LEFT HEART CATHETERIZATION WITH CORONARY ANGIOGRAM;  Surgeon: Wellington Hampshire, MD;  Location: [facility] CATH LAB;  Service: Cardiovascular;  Laterality: N/A;  . LEFT HEART CATHETERIZATION WITH CORONARY ANGIOGRAM N/A 03/10/2014   Procedure: LEFT HEART CATHETERIZATION WITH CORONARY ANGIOGRAM;  Surgeon: Jettie Booze, MD;  Location: [facility] CATH LAB;  Service: Cardiovascular;  Laterality: N/A;    Current Medications: No outpatient prescriptions have been marked as taking for the 01/18/17 encounter (Appointment) with Liliane Shi, PA-C.     Allergies:   Patient has no known allergies.   Social History   Social History  .  Marital status: Married    Spouse name: N/A  . Number of children: 3  . Years of education: N/A   Occupational History  . Retired    Social History Main Topics  . Smoking status: Former Smoker    Quit date: 06/09/2009  . Smokeless tobacco: Never Used  . Alcohol use No  . Drug use: No  . Sexual activity: Not Currently   Other Topics Concern  . Not on file   Social History Narrative   Marital: married.      Lives: with son,wife.       Children: 3 children; 6 grandchildren      Employed: unemployed; disability unknown reason/CVA L sided weakness      Tobacco:  Quit 2012; smoked 40 years.       Alcohol: no drinking now; social in past.       Drugs: none       Exercise: sporadic.       ADLs:  No driving since CVA.  Family History  Problem Relation Age of Onset  . Diabetes Father   . Hypertension Father   . Heart attack Father   . Stroke Neg Hx      ROS:   Please see the history of present illness.    ROS All other systems reviewed and are negative.   EKGs/Labs/Other Test Reviewed:    EKG:  EKG is *** ordered today.  The ekg ordered today demonstrates ***  Recent Labs: 12/01/2016: ALT 18; B Natriuretic Peptide 28.8; BUN 13; Creatinine, Ser 1.09; Hemoglobin 15.1; Platelets 173; Potassium 3.8; Sodium 137   Recent Lipid Panel    Component Value Date/Time   CHOL 241 (H) 10/11/2016 1156   TRIG 243 (H) 10/11/2016 1156   HDL 35 (L) 10/11/2016 1156   CHOLHDL 6.9 (H) 10/11/2016 1156   CHOLHDL 5 04/20/2015 0951   VLDL 41.4 (H) 04/20/2015 0951   LDLCALC 157 (H) 10/11/2016 1156   LDLDIRECT 101.0 04/20/2015 0951     Physical Exam:    VS:  There were no vitals taken for this visit.    Wt Readings from Last 3 Encounters:  11/17/16 145 lb (65.8 kg)  10/26/16 158 lb (71.7 kg)  10/19/16 159 lb (72.1 kg)     ***Physical Exam  ASSESSMENT:    No diagnosis found. PLAN:    In order of problems listed above:  No diagnosis found.***  S/p NSTEMI in 2015 treated  with DES to the RCA and subsequent PCI to the LCx 2/2 [place]. Myoview in 2/16 was neg for ischemia. The last LHC in 9/16 demonstrated patent stents to the RCA and LCx and otherwise stable anatomy compared to the films in 2015. ***  2. PAD:  Penetrating ulcer left CIA and chronic SMA dissection. Last CTA abdomen with stable findings.  He is followed by [facility]. Repeat CT due in 2019.    3. HTN - BP above target.  Start Lisinopril 10 mg QD.  Check BMET 1 week.  5.Hyperlipidemia: Resume Crestor.  Arrange follow up Lipids and LFTs at next OV.    6. Diabetes - Uncontrolled. Admitted earlier this month with HONK - A1c 14.  Continue close follow up with PCP for management. Dispo:  No Follow-up on file.   Medication Adjustments/Labs and Tests Ordered: Current medicines are reviewed at length with the patient today.  Concerns regarding medicines are outlined above.  Orders placed this visit:  No orders of the defined types were placed in this encounter.  Medication changes this visit: No orders of the defined types were placed in this encounter.   Signed, Richardson Dopp, PA-C  01/18/2017 1:11 PM    [facility] [place], [place], [place]  [address] Phone: [phone]; Fax: [phone]

## 2017-03-28 ENCOUNTER — Emergency Department (HOSPITAL_COMMUNITY)
Admission: EM | Admit: 2017-03-28 | Discharge: 2017-03-28 | Disposition: A | Discharge status: Home / Self Care | Payer: Medicare Other | Attending: Emergency Medicine | Admitting: Emergency Medicine

## 2017-03-28 ENCOUNTER — Encounter (HOSPITAL_COMMUNITY): Payer: Self-pay | Admitting: Emergency Medicine

## 2017-03-28 ENCOUNTER — Emergency Department (HOSPITAL_COMMUNITY): Payer: Medicare Other

## 2017-03-28 DIAGNOSIS — E785 Hyperlipidemia, unspecified: Secondary | ICD-10-CM | POA: Diagnosis not present

## 2017-03-28 DIAGNOSIS — I110 Hypertensive heart disease with heart failure: Secondary | ICD-10-CM | POA: Diagnosis not present

## 2017-03-28 DIAGNOSIS — I2510 Atherosclerotic heart disease of native coronary artery without angina pectoris: Secondary | ICD-10-CM | POA: Insufficient documentation

## 2017-03-28 DIAGNOSIS — Z87891 Personal history of nicotine dependence: Secondary | ICD-10-CM | POA: Insufficient documentation

## 2017-03-28 DIAGNOSIS — Z8673 Personal history of transient ischemic attack (TIA), and cerebral infarction without residual deficits: Secondary | ICD-10-CM | POA: Insufficient documentation

## 2017-03-28 DIAGNOSIS — R55 Syncope and collapse: Secondary | ICD-10-CM | POA: Insufficient documentation

## 2017-03-28 DIAGNOSIS — I252 Old myocardial infarction: Secondary | ICD-10-CM | POA: Insufficient documentation

## 2017-03-28 DIAGNOSIS — I5032 Chronic diastolic (congestive) heart failure: Secondary | ICD-10-CM | POA: Insufficient documentation

## 2017-03-28 DIAGNOSIS — Z79899 Other long term (current) drug therapy: Secondary | ICD-10-CM | POA: Diagnosis not present

## 2017-03-28 DIAGNOSIS — E119 Type 2 diabetes mellitus without complications: Secondary | ICD-10-CM | POA: Diagnosis not present

## 2017-03-28 DIAGNOSIS — Z7982 Long term (current) use of aspirin: Secondary | ICD-10-CM | POA: Insufficient documentation

## 2017-03-28 DIAGNOSIS — I723 Aneurysm of iliac artery: Secondary | ICD-10-CM | POA: Diagnosis not present

## 2017-03-28 DIAGNOSIS — E860 Dehydration: Secondary | ICD-10-CM | POA: Diagnosis not present

## 2017-03-28 DIAGNOSIS — R404 Transient alteration of awareness: Secondary | ICD-10-CM | POA: Diagnosis not present

## 2017-03-28 DIAGNOSIS — R42 Dizziness and giddiness: Secondary | ICD-10-CM | POA: Diagnosis not present

## 2017-03-28 DIAGNOSIS — R531 Weakness: Secondary | ICD-10-CM | POA: Diagnosis not present

## 2017-03-28 LAB — CBC
HCT: 47.3 % (ref 39.0–52.0)
Hemoglobin: 14.9 g/dL (ref 13.0–17.0)
MCH: 27.7 pg (ref 26.0–34.0)
MCHC: 31.5 g/dL (ref 30.0–36.0)
MCV: 87.9 fL (ref 78.0–100.0)
Platelets: 149 10*3/uL — ABNORMAL LOW (ref 150–400)
RBC: 5.38 MIL/uL (ref 4.22–5.81)
RDW: 13.3 % (ref 11.5–15.5)
WBC: 6.7 10*3/uL (ref 4.0–10.5)

## 2017-03-28 LAB — URINALYSIS, ROUTINE W REFLEX MICROSCOPIC
Bilirubin Urine: NEGATIVE
Glucose, UA: 150 mg/dL — AB
Hgb urine dipstick: NEGATIVE
Ketones, ur: NEGATIVE mg/dL
Leukocytes, UA: NEGATIVE
Nitrite: NEGATIVE
Protein, ur: NEGATIVE mg/dL
Specific Gravity, Urine: 1.009 (ref 1.005–1.030)
pH: 7 (ref 5.0–8.0)

## 2017-03-28 LAB — I-STAT CG4 LACTIC ACID, ED
Lactic Acid, Venous: 2.56 mmol/L (ref 0.5–1.9)
Lactic Acid, Venous: 2.05 mmol/L (ref 0.5–1.9)

## 2017-03-28 LAB — HEPATIC FUNCTION PANEL
ALT: 18 U/L (ref 17–63)
AST: 29 U/L (ref 15–41)
Albumin: 3.7 g/dL (ref 3.5–5.0)
Alkaline Phosphatase: 78 U/L (ref 38–126)
Bilirubin, Direct: 0.2 mg/dL (ref 0.1–0.5)
Indirect Bilirubin: 0.6 mg/dL (ref 0.3–0.9)
Total Bilirubin: 0.8 mg/dL (ref 0.3–1.2)
Total Protein: 6.7 g/dL (ref 6.5–8.1)

## 2017-03-28 LAB — PROTIME-INR
INR: 0.94
Prothrombin Time: 12.5 seconds (ref 11.4–15.2)

## 2017-03-28 LAB — BASIC METABOLIC PANEL
Anion gap: 10 (ref 5–15)
BUN: 16 mg/dL (ref 6–20)
CO2: 24 mmol/L (ref 22–32)
Calcium: 9.2 mg/dL (ref 8.9–10.3)
Chloride: 104 mmol/L (ref 101–111)
Creatinine, Ser: 1.19 mg/dL (ref 0.61–1.24)
GFR calc Af Amer: >60 mL/min (ref >60)
GFR calc non Af Amer: >60 mL/min (ref >60)
Glucose, Bld: 223 mg/dL — ABNORMAL HIGH (ref 65–99)
Potassium: 4 mmol/L (ref 3.5–5.1)
Sodium: 138 mmol/L (ref 135–145)

## 2017-03-28 LAB — I-STAT TROPONIN, ED
Troponin i, poc: 0 ng/mL (ref 0.00–0.08)
Troponin i, poc: 0 ng/mL (ref 0.00–0.08)

## 2017-03-28 LAB — CBG MONITORING, ED: Glucose-Capillary: 195 mg/dL — ABNORMAL HIGH (ref 65–99)

## 2017-03-28 LAB — LIPASE, BLOOD: Lipase: 32 U/L (ref 11–51)

## 2017-03-28 MED ORDER — SODIUM CHLORIDE 0.9 % IV BOLUS (SEPSIS)
1000.0000 mL | Freq: Once | INTRAVENOUS | Status: AC
  Administered 2017-03-28: 1000 mL via INTRAVENOUS

## 2017-03-28 NOTE — ED Notes (Signed)
Pt and family made aware of need for urine sample.

## 2017-03-28 NOTE — ED Provider Notes (Signed)
[facility] DEPT Provider Note   CSN: [id number] Arrival date & time: 03/28/17  0705     History   Chief Complaint Chief Complaint  Patient presents with  . Loss of Consciousness    HPI Barry [name] is a 66 y.o. male.  The history is provided by the patient and medical records. No language interpreter was used.  Loss of Consciousness   This is a recurrent problem. The current episode started less than 1 hour ago. The problem occurs rarely. The problem has been resolved. He lost consciousness for a period of less than one minute. The problem is associated with standing up (uriantion). Associated symptoms include abdominal pain, light-headedness and malaise/fatigue. Pertinent negatives include back pain, bladder incontinence, bowel incontinence, chest pain, confusion, congestion, diaphoresis, dizziness, fever, nausea, palpitations, seizures, slurred speech, visual change, vomiting and weakness. He has tried nothing for the symptoms. The treatment provided no relief. His past medical history is significant for CAD, CVA, DM and HTN.    Past Medical History:  Diagnosis Date  . CAD (coronary artery disease)    a. LHC (02/26/14):  mLAD 20 (faint L>R collats), mCFX 50, pRCA 95 (plaque rupture) >> PCI:  Xience DES to pRCA (severe tortuosity of R innominate artery >> needs L radial or FA in future);  b. LHC (03/10/14):  CFX 90, oOM 70, pRCA stent patent >> PCI:  Xience DES to mCFX   . Carotid stenosis    a. Carotid US (02/27/14):  Bilateral ICA 1-39%  . Diabetes mellitus   . 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[place])    a. Mesenteric Artery Duplex (7/15):  pSMA chronic dissection with aneurysmal dilation of 1.29 cm (VVS);  b.  Chest CTA (02/26/14):  IMPRESSION:  1. No aortic dissection or other acute abnormality.  2. 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  3. Stable 17 mm ectasia of the left common iliac artery.  4. Atherosclerosis, including aortoiliac and coronary artery disease.   Marland Kitchen  History of echocardiogram 09/2015   Echo 1/17: mod LVH, EF 55%, inf-lat HK, Gr 1 DD, mild LAE  . Hx of cardiovascular stress test    Lexiscan Myoview (2/16):  Inferior lateral defect c/w thinning vs small prior infarct, no ischemia, EF 49%, Low Risk  . Hx of echocardiogram    a.  Echocardiogram (02/27/14):  Mod focal basal and mild concentric LVH, EF 50-55%, no RWMA, Gr 1 DD, mild TR, normal RVF  . Hyperlipidemia   . Hypertension   . Iliac artery aneurysm, left ([place])   . Myocardial infarction ([place])   . Stroke [facility])     Patient Active Problem List   Diagnosis Date Noted  . Atherosclerotic peripheral vascular disease with ulceration ([place]) 04/03/2015  . Special screening for malignant neoplasms, colon 05/07/2014  . Coronary artery disease involving native coronary artery of native heart without angina pectoris 03/28/2014  . Aneurysm of iliac artery ([place]) 03/18/2014  . Abdominal pain, unspecified site 03/18/2014  . Syncope in setting of nausea and vomiting 03/07/14 03/08/2014  . Non compliance with medical treatment 02/28/2014  . Dyslipidemia, goal LDL below 70 02/28/2014  . Bradycardia- beta blocker decreased 02/28/2014  . Chronic diastolic CHF (congestive heart failure) ([place]) 02/28/2014  . Type 2 diabetes mellitus with manifestations ([place]) 02/27/2014  . TIA (transient ischemic attack) 02/26/2014  . History of non-ST elevation myocardial infarction (NSTEMI) 02/26/2014  . Pain in limb-Abdomen  12/06/2013  . Weakness-Left arm / Lef 12/06/2013  . Mesenteric artery stenosis ([place]) 12/06/2013  .  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[place]) 12/03/2012  . Hypertension 11/10/2011  . History of CVA in adulthood 11/10/2011  . Language barrier 11/10/2011    Past Surgical History:  Procedure Laterality Date  . Admission  09/26/2009   CVA.  [name].  Marland Kitchen CARDIAC CATHETERIZATION    . CARDIAC CATHETERIZATION N/A 06/25/2015   Procedure: Left Heart Cath and Coronary Angiography;  Surgeon: Burnell Blanks, MD;  Location: [facility];  Service: Cardiovascular;  Laterality: N/A;  . COLONOSCOPY WITH PROPOFOL N/A 07/23/2015   Procedure: COLONOSCOPY WITH PROPOFOL;  Surgeon: [name], MD;  Location: [facility] ENDOSCOPY;  Service: Endoscopy;  Laterality: N/A;  . CORONARY ANGIOPLASTY    . LEFT HEART CATHETERIZATION WITH CORONARY ANGIOGRAM N/A 02/26/2014   Procedure: LEFT HEART CATHETERIZATION WITH CORONARY ANGIOGRAM;  Surgeon: Wellington Hampshire, MD;  Location: [facility] CATH LAB;  Service: Cardiovascular;  Laterality: N/A;  . LEFT HEART CATHETERIZATION WITH CORONARY ANGIOGRAM N/A 03/10/2014   Procedure: LEFT HEART CATHETERIZATION WITH CORONARY ANGIOGRAM;  Surgeon: Jettie Booze, MD;  Location: [facility] CATH LAB;  Service: Cardiovascular;  Laterality: N/A;       Home Medications    Prior to Admission medications   Medication Sig Start Date End Date Taking? Authorizing Provider  aspirin 81 MG EC tablet Take 81 mg by mouth daily as needed.  02/12/15   Larey Dresser, MD  Blood Glucose Monitoring Suppl (ONE TOUCH ULTRA SYSTEM KIT) W/DEVICE KIT 1 kit by Does not apply route once. One touch delica lancets, and meter Pt will test once daily. Dx 250.92 04/25/14   [name] Mons, PA-C  gabapentin (NEURONTIN) 300 MG capsule TAKE ONE CAPSULE BY MOUTH THREE TIMES DAILY AND  2  TABS  AS  NEEDED  AT  NIGHT  FOR  LEG  PAIN 10/11/16   Wendie Agreste, MD  glucose blood (ONE TOUCH ULTRA TEST) test strip Test blood sugar 3 times a day. Dx code: E11.8 10/11/16   Wendie Agreste, MD  glucose monitoring kit (FREESTYLE) monitoring kit 1 each by Does not apply route 4 (four) times daily - after meals and at bedtime. 1 month Diabetic Testing Supplies for QAC-QHS accuchecks. 10/13/16   Ghimire, Henreitta Leber, MD  Insulin Detemir (LEVEMIR FLEXTOUCH) 100 UNIT/ML Pen Inject 20 Units into the skin 2 (two) times daily. Patient taking differently: Inject 10 Units into the skin daily at 10 pm.  10/13/16   Ghimire, Henreitta Leber, MD    Insulin Pen Needle 32G X 8 MM MISC novotwist 32x90m  Pen needles Use once as directed daily 12/17/16   Copland, JGay Filler MD  Lancets MISC Use to check blood sugar three times daily. Dx: E11.8 10/11/16   GWendie Agreste MD  LEVEMIR FLEXTOUCH 100 UNIT/ML Pen INJECT 10 UNITS SUBCUTANEOUSLY ONCE DAILY AT 10:00 PM 12/17/16   Copland, JGay Filler MD  lisinopril (PRINIVIL,ZESTRIL) 10 MG tablet Take 1 tablet (10 mg total) by mouth daily. 10/19/16   WRichardson DoppT, PA-C  metFORMIN (GLUCOPHAGE) 500 MG tablet Take 1 tablet (500 mg total) by mouth 2 (two) times daily with a meal. Patient not taking: Reported on 11/17/2016 10/26/16   Copland, JGay Filler MD  nitroGLYCERIN (NITROSTAT) 0.4 MG SL tablet Place 1 tablet (0.4 mg total) under the tongue every 5 (five) minutes as needed for chest pain. 10/18/16   GWendie Agreste MD  ondansetron (ZOFRAN) 4 MG tablet Take 1 tablet (4 mg total) by mouth every 8 (eight) hours as needed  for nausea or vomiting. Patient not taking: Reported on 11/25/2016 11/17/16   Trevionne Advani, Gwenyth Allegra, MD  ranitidine (ZANTAC) 150 MG tablet Take 150 mg by mouth as needed for heartburn.    [provider]  rosuvastatin (CRESTOR) 40 MG tablet Take 1 tablet (40 mg total) by mouth daily. 10/19/16 10/14/17  Liliane Shi, PA-C    Family History Family History  Problem Relation Age of Onset  . Diabetes Father   . Hypertension Father   . Heart attack Father   . Stroke Neg Hx     Social History Social History  Substance Use Topics  . Smoking status: Former Smoker    Quit date: 06/09/2009  . Smokeless tobacco: Never Used  . Alcohol use No     Allergies   Patient has no known allergies.   Review of Systems Review of Systems  Constitutional: Positive for fatigue and malaise/fatigue. Negative for chills, diaphoresis and fever.  HENT: Negative for congestion.   Eyes: Negative for visual disturbance.  Respiratory: Negative for cough, chest tightness, shortness of breath,  wheezing and stridor.   Cardiovascular: Positive for syncope. Negative for chest pain, palpitations and leg swelling.  Gastrointestinal: Positive for abdominal pain. Negative for bowel incontinence, constipation, diarrhea, nausea and vomiting.  Genitourinary: Negative for bladder incontinence and dysuria.  Musculoskeletal: Negative for back pain.  Neurological: Positive for light-headedness. Negative for dizziness, seizures, facial asymmetry, weakness and numbness.  Psychiatric/Behavioral: Negative for agitation and confusion.  All other systems reviewed and are negative.    Physical Exam Updated Vital Signs BP (!) 168/96 (BP Location: Right Arm)   Pulse 64   Temp 98.3 F (36.8 C) (Oral)   Resp 19   Ht _0  (1.702 m)   Wt 68 kg (150 lb)   SpO2 94%   BMI 23.49 kg/m   Physical Exam  Constitutional: He appears well-developed and well-nourished.  HENT:  Head: Normocephalic and atraumatic.  Mouth/Throat: Oropharynx is clear and moist. No oropharyngeal exudate.  Eyes: Conjunctivae and EOM are normal. Pupils are equal, round, and reactive to light.  Neck: Normal range of motion. Neck supple.  Cardiovascular: Normal rate, regular rhythm, normal heart sounds and intact distal pulses.   No murmur heard. Pulmonary/Chest: Effort normal and breath sounds normal. No stridor. No respiratory distress. He has no wheezes. He exhibits no tenderness.  Abdominal: Soft. There is no tenderness.  Musculoskeletal: He exhibits no edema or tenderness.  Neurological: He is alert. No cranial nerve deficit or sensory deficit. He exhibits normal muscle tone. Coordination normal.  Skin: Skin is warm and dry. Capillary refill takes less than 2 seconds. No erythema. No pallor.  Psychiatric: He has a normal mood and affect.  Nursing note and vitals reviewed.    ED Treatments / Results  Labs (all labs ordered are listed, but only abnormal results are displayed) Labs Reviewed  BASIC METABOLIC PANEL -  Abnormal; Notable for the following:       Result Value   Glucose, Bld 223 (*)    All other components within normal limits  CBC - Abnormal; Notable for the following:    Platelets 149 (*)    All other components within normal limits  URINALYSIS, ROUTINE W REFLEX MICROSCOPIC - Abnormal; Notable for the following:    Color, Urine STRAW (*)    Glucose, UA 150 (*)    All other components within normal limits  CBG MONITORING, ED - Abnormal; Notable for the following:    Glucose-Capillary 195 (*)  All other components within normal limits  I-STAT CG4 LACTIC ACID, ED - Abnormal; Notable for the following:    Lactic Acid, Venous 2.56 (*)    All other components within normal limits  I-STAT CG4 LACTIC ACID, ED - Abnormal; Notable for the following:    Lactic Acid, Venous 2.05 (*)    All other components within normal limits  HEPATIC FUNCTION PANEL  LIPASE, BLOOD  PROTIME-INR  I-STAT TROPOININ, ED  I-STAT TROPOININ, ED    EKG  EKG Interpretation  Date/Time:  Tuesday March 28 2017 11:38:54 EDT Ventricular Rate:  56 PR Interval:    QRS Duration: 120 QT Interval:  456 QTC Calculation: 441 R Axis:   53 Text Interpretation:  Sinus rhythm Nonspecific intraventricular conduction delay Nonspecific T abnrm, anterolateral leads When compared to prior, improved QTc.  No STEMI.  Confirmed by Antony Blackbird [phone]) on 03/28/2017 12:17:26 PM       Radiology Dg Chest 2 View  Result Date: 03/28/2017 CLINICAL DATA:  Became dizzy and fell when getting out of bed this morning, tired, generalized weakness EXAM: CHEST  2 VIEW COMPARISON:  3 8,018 FINDINGS: Upper normal heart size. Mediastinal contours and pulmonary vascularity normal. Lungs clear. No pleural effusion or pneumothorax. No acute osseous findings. IMPRESSION: No acute abnormalities. Electronically Signed   By: Lavonia Dana M.D.   On: 03/28/2017 07:54    Procedures Procedures (including critical care time)  Medications Ordered in  ED Medications  sodium chloride 0.9 % bolus 1,000 mL (0 mLs Intravenous Stopped 03/28/17 0949)  sodium chloride 0.9 % bolus 1,000 mL (1,000 mLs Intravenous New Bag/Given 03/28/17 1111)     Initial Impression / Assessment and Plan / ED Course  I have reviewed the triage vital signs and the nursing notes.  Pertinent labs & imaging results that were available during my care of the patient were reviewed by me and considered in my medical decision making (see chart for details).     Barry [name] is a 66 y.o. male with a past medical history significant for hypertension, hyperlipidemia, diabetes, CAD with MI, prior stroke, and documentation showing mesenteric dissection and iliac artery aneurysm who presents with fatigue, generalized malaise, lightheadness and syncopal episode. Patient reports that he has been feeling well over the last few days but this morning after urinating in the bathroom, patient had a syncopal episode. According to patient he was only out for several seconds. He says that he has had this same presentation before when he was dehydrated. He says this feels "the same".   Of note, a [place] interpreter was used for all conversations.  Patient denies any fevers, chills, chest pain, shortness breath, palpitations, nausea, vomiting, constipation, diarrhea, dysuria, frequency, or hesitation. He denies recent injuries. He says he had some very mild abdominal cramping that has completely resolved.  History and exam are seen above. On exam, patient's abdomen was completely nontender. No CVA tenderness. Lungs completely clear. No murmurs. Symmetric pulses in all extremity. No focal neurologic deficits.   Initial EKG showed prolonged QTC but no other significant abnormalities.  Given patient's similar presentation to dehydration in the past, patient will have screening laboratory testing to look for abnormalities. Patient will be given fluids. Patient will have labs look for occult  infection.  Initial lactic acid slightly elevated at 2.5, this will be trended after fluids.  Patient's well appearance, if symptoms improve and reassuring workup results, suspect patient will be stable for discharge.  12:14 PM Patient was reassessed and  had no further lightheadedness. Patient felt "much better" after fluids. Patient's daughter arrived and said patient has been working out in the sun in his garden over the last few days. She thinks he may be dehydrated.  Patient's repeat lactic acid was improving. Patient continued on fluids. Patient had repeat EKG which showed resolution of prolonged QTC.  Suspect dehydration as the cause of his symptoms given his history of the same and workup results today. Other laboratory testing was reassuring with negative troponins, no evidence of UTI, normal hepatic function, normal lipase, and normal INR. Chest x-ray shows no pneumonia.  Given the resolution of symptoms and reassuring workup, patient will be discharged home with family. Patient instructed to follow-up with PCP and observe strict return precautions. Patient and family understood plan of care and patient discharged in good condition.   Final Clinical Impressions(s) / ED Diagnoses   Final diagnoses:  Syncope, unspecified syncope type  Dehydration    New Prescriptions Discharge Medication List as of 03/28/2017 12:14 PM      Clinical Impression: 1. Syncope, unspecified syncope type   2. Dehydration     Disposition: Discharge  Condition: Good  I have discussed the results, Dx and Tx plan with the pt(& family if present). He/she/they expressed understanding and agree(s) with the plan. Discharge instructions discussed at great length. Strict return precautions discussed and pt &/or family have verbalized understanding of the instructions. No further questions at time of discharge.    New Prescriptions   No medications on file    Follow Up: [place], Gay Filler, [address] [place] [address] [phone]  Schedule an appointment as soon as possible for a visit    [facility] [address] [id number] [place] [phone]  If symptoms worsen     Tim Corriher, Gwenyth Allegra, MD 03/28/17 1655

## 2017-03-28 NOTE — ED Notes (Signed)
cbg was 195

## 2017-03-28 NOTE — ED Notes (Signed)
ED Provider at bedside. 

## 2017-03-28 NOTE — ED Triage Notes (Signed)
BIB EMS from home, pt had syncopal episode while walking to bathroom, pt is A/Ox4, denies pain. VSS. CBG 159

## 2017-03-28 NOTE — Discharge Instructions (Signed)
Your workup today showed evidence of dehydration as the likely cause of lightheadedness and passing out. Please try and stay hydrated and stay out of the sun and heat. We did not find evidence of infection. Please follow-up with your PCP for reassessment and further management. If any symptoms change or worsen, please return to the nearest emergency department.

## 2017-03-28 NOTE — ED Notes (Signed)
[place] interpreter used- Pt reports getting out of bed this morning and feeling dizzy. Pt states that he fell. No injury noted. Pt denies any urinary issues. reports abdominal pain prior to episode this morning. Pt states that he feels tired and generalized weakness. Pt states that this feels similar to when he had syncopal episode in the past that was related to dehydration. Pt denies chest pain.

## 2017-04-22 ENCOUNTER — Other Ambulatory Visit: Payer: Self-pay | Admitting: Physician Assistant

## 2017-04-26 ENCOUNTER — Other Ambulatory Visit: Payer: Self-pay

## 2017-04-26 NOTE — Telephone Encounter (Signed)
Patient taking Valsartan and would like to d/e because of the recall  would like recplace.

## 2017-04-27 MED ORDER — INSULIN DETEMIR 100 UNIT/ML FLEXPEN: PEN_INJECTOR | SUBCUTANEOUS | 1 refills | Status: DC

## 2017-04-27 NOTE — Telephone Encounter (Signed)
Refilled his insulin.  However he is not taking valsartan- did they get this medication somewhere else?  He is well overdue to see me - please call and ask him to come in, try to find out about valsartan

## 2017-05-02 ENCOUNTER — Encounter (HOSPITAL_COMMUNITY): Payer: Self-pay | Admitting: *Deleted

## 2017-05-02 ENCOUNTER — Observation Stay (HOSPITAL_COMMUNITY)
Admission: EM | Admit: 2017-05-02 | Discharge: 2017-05-04 | Disposition: A | Discharge status: Home / Self Care | Payer: Medicare Other | Attending: Internal Medicine | Admitting: Internal Medicine

## 2017-05-02 ENCOUNTER — Emergency Department (HOSPITAL_COMMUNITY): Payer: Medicare Other

## 2017-05-02 DIAGNOSIS — I252 Old myocardial infarction: Secondary | ICD-10-CM | POA: Insufficient documentation

## 2017-05-02 DIAGNOSIS — Z87891 Personal history of nicotine dependence: Secondary | ICD-10-CM | POA: Insufficient documentation

## 2017-05-02 DIAGNOSIS — Z8673 Personal history of transient ischemic attack (TIA), and cerebral infarction without residual deficits: Secondary | ICD-10-CM | POA: Insufficient documentation

## 2017-05-02 DIAGNOSIS — Z9119 Patient's noncompliance with other medical treatment and regimen: Secondary | ICD-10-CM

## 2017-05-02 DIAGNOSIS — I10 Essential (primary) hypertension: Secondary | ICD-10-CM

## 2017-05-02 DIAGNOSIS — E119 Type 2 diabetes mellitus without complications: Secondary | ICD-10-CM | POA: Insufficient documentation

## 2017-05-02 DIAGNOSIS — E118 Type 2 diabetes mellitus with unspecified complications: Secondary | ICD-10-CM

## 2017-05-02 DIAGNOSIS — I5032 Chronic diastolic (congestive) heart failure: Secondary | ICD-10-CM | POA: Insufficient documentation

## 2017-05-02 DIAGNOSIS — R0602 Shortness of breath: Secondary | ICD-10-CM | POA: Insufficient documentation

## 2017-05-02 DIAGNOSIS — R079 Chest pain, unspecified: Secondary | ICD-10-CM | POA: Diagnosis not present

## 2017-05-02 DIAGNOSIS — E785 Hyperlipidemia, unspecified: Secondary | ICD-10-CM

## 2017-05-02 DIAGNOSIS — E1140 Type 2 diabetes mellitus with diabetic neuropathy, unspecified: Secondary | ICD-10-CM

## 2017-05-02 DIAGNOSIS — R05 Cough: Secondary | ICD-10-CM | POA: Diagnosis not present

## 2017-05-02 DIAGNOSIS — Z91199 Patient's noncompliance with other medical treatment and regimen due to unspecified reason: Secondary | ICD-10-CM

## 2017-05-02 DIAGNOSIS — I110 Hypertensive heart disease with heart failure: Secondary | ICD-10-CM | POA: Insufficient documentation

## 2017-05-02 DIAGNOSIS — Z794 Long term (current) use of insulin: Secondary | ICD-10-CM | POA: Insufficient documentation

## 2017-05-02 DIAGNOSIS — Z7982 Long term (current) use of aspirin: Secondary | ICD-10-CM | POA: Diagnosis not present

## 2017-05-02 DIAGNOSIS — Z79899 Other long term (current) drug therapy: Secondary | ICD-10-CM | POA: Insufficient documentation

## 2017-05-02 DIAGNOSIS — I200 Unstable angina: Principal | ICD-10-CM | POA: Insufficient documentation

## 2017-05-02 LAB — BASIC METABOLIC PANEL
Anion gap: 11 (ref 5–15)
BUN: 12 mg/dL (ref 6–20)
CO2: 27 mmol/L (ref 22–32)
Calcium: 9.7 mg/dL (ref 8.9–10.3)
Chloride: 100 mmol/L — ABNORMAL LOW (ref 101–111)
Creatinine, Ser: 1.2 mg/dL (ref 0.61–1.24)
GFR calc Af Amer: >60 mL/min (ref >60)
GFR calc non Af Amer: >60 mL/min (ref >60)
Glucose, Bld: 160 mg/dL — ABNORMAL HIGH (ref 65–99)
Potassium: 3.4 mmol/L — ABNORMAL LOW (ref 3.5–5.1)
Sodium: 138 mmol/L (ref 135–145)

## 2017-05-02 LAB — CBC
HCT: 47.5 % (ref 39.0–52.0)
Hemoglobin: 15.6 g/dL (ref 13.0–17.0)
MCH: 28 pg (ref 26.0–34.0)
MCHC: 32.8 g/dL (ref 30.0–36.0)
MCV: 85.3 fL (ref 78.0–100.0)
Platelets: 214 10*3/uL (ref 150–400)
RBC: 5.57 MIL/uL (ref 4.22–5.81)
RDW: 13.1 % (ref 11.5–15.5)
WBC: 7.5 10*3/uL (ref 4.0–10.5)

## 2017-05-02 LAB — I-STAT TROPONIN, ED: Troponin i, poc: 0 ng/mL (ref 0.00–0.08)

## 2017-05-02 MED ORDER — ASPIRIN EC 81 MG PO TBEC
81.0000 mg | DELAYED_RELEASE_TABLET | Freq: Every day | ORAL | Status: DC
  Administered 2017-05-03 – 2017-05-04 (×2): 81 mg via ORAL
  Filled 2017-05-02 (×2): qty 1

## 2017-05-02 MED ORDER — ONDANSETRON HCL 4 MG PO TABS: 4.0000 mg | ORAL_TABLET | Freq: Three times a day (TID) | ORAL | Status: DC | PRN

## 2017-05-02 MED ORDER — LISINOPRIL 10 MG PO TABS
10.0000 mg | ORAL_TABLET | Freq: Every day | ORAL | Status: DC
  Administered 2017-05-03 – 2017-05-04 (×2): 10 mg via ORAL
  Filled 2017-05-02 (×2): qty 1

## 2017-05-02 MED ORDER — INSULIN DETEMIR 100 UNIT/ML ~~LOC~~ SOLN
8.0000 [IU] | Freq: Every day | SUBCUTANEOUS | Status: DC
  Administered 2017-05-03: 8 [IU] via SUBCUTANEOUS
  Filled 2017-05-02: qty 0.08

## 2017-05-02 MED ORDER — NITROGLYCERIN 0.4 MG SL SUBL
0.4000 mg | SUBLINGUAL_TABLET | SUBLINGUAL | Status: DC | PRN
  Administered 2017-05-02 (×3): 0.4 mg via SUBLINGUAL
  Filled 2017-05-02: qty 1

## 2017-05-02 MED ORDER — ROSUVASTATIN CALCIUM 10 MG PO TABS
40.0000 mg | ORAL_TABLET | Freq: Every day | ORAL | Status: DC
  Administered 2017-05-03 – 2017-05-04 (×2): 40 mg via ORAL
  Filled 2017-05-02: qty 1
  Filled 2017-05-02: qty 4
  Filled 2017-05-02: qty 1

## 2017-05-02 NOTE — ED Notes (Signed)
Taken to xray at this time. 

## 2017-05-02 NOTE — ED Triage Notes (Signed)
Pt c/o generalized chest pain, SOB, and weakness onset this morning. Pt has significant cardiac hx

## 2017-05-02 NOTE — ED Notes (Signed)
Son leaving at this time.  Recliner provided for wife.

## 2017-05-03 ENCOUNTER — Encounter (HOSPITAL_COMMUNITY): Payer: Self-pay | Admitting: General Practice

## 2017-05-03 DIAGNOSIS — I200 Unstable angina: Secondary | ICD-10-CM | POA: Diagnosis not present

## 2017-05-03 DIAGNOSIS — R079 Chest pain, unspecified: Secondary | ICD-10-CM | POA: Diagnosis not present

## 2017-05-03 LAB — GLUCOSE, CAPILLARY
Glucose-Capillary: 136 mg/dL — ABNORMAL HIGH (ref 65–99)
Glucose-Capillary: 109 mg/dL — ABNORMAL HIGH (ref 65–99)
Glucose-Capillary: 160 mg/dL — ABNORMAL HIGH (ref 65–99)

## 2017-05-03 LAB — TROPONIN I
Troponin I: <0.03 ng/mL (ref <0.03)
Troponin I: <0.03 ng/mL (ref <0.03)
Troponin I: <0.03 ng/mL (ref <0.03)

## 2017-05-03 MED ORDER — AMLODIPINE BESYLATE 5 MG PO TABS
5.0000 mg | ORAL_TABLET | Freq: Every day | ORAL | Status: DC
  Administered 2017-05-03 – 2017-05-04 (×2): 5 mg via ORAL
  Filled 2017-05-03 (×2): qty 1

## 2017-05-03 MED ORDER — ONDANSETRON HCL 4 MG/2ML IJ SOLN: 4.0000 mg | Freq: Four times a day (QID) | INTRAMUSCULAR | Status: DC | PRN

## 2017-05-03 MED ORDER — INSULIN ASPART 100 UNIT/ML ~~LOC~~ SOLN: 0.0000 [IU] | Freq: Every day | SUBCUTANEOUS | Status: DC

## 2017-05-03 MED ORDER — ENOXAPARIN SODIUM 40 MG/0.4ML ~~LOC~~ SOLN
40.0000 mg | Freq: Every day | SUBCUTANEOUS | Status: DC
  Administered 2017-05-03: 40 mg via SUBCUTANEOUS
  Filled 2017-05-03 (×2): qty 0.4

## 2017-05-03 MED ORDER — INSULIN ASPART 100 UNIT/ML ~~LOC~~ SOLN: 0.0000 [IU] | Freq: Three times a day (TID) | SUBCUTANEOUS | Status: DC

## 2017-05-03 MED ORDER — HYDRALAZINE HCL 20 MG/ML IJ SOLN
5.0000 mg | INTRAMUSCULAR | Status: DC | PRN
Start: 2017-05-03 — End: 2017-05-04

## 2017-05-03 MED ORDER — ACETAMINOPHEN 325 MG PO TABS: 650.0000 mg | ORAL_TABLET | ORAL | Status: DC | PRN

## 2017-05-03 NOTE — H&P (Signed)
History and Physical    [name] IOE:[id number] DOB: 15-Apr-1951 DOA: 05/02/2017  PCP: Darreld Mclean, MD  Patient coming from: Home  I have personally briefly reviewed patient's old medical records in [place]  Chief Complaint: Chest pain  HPI: Barry [name] is a 66 y.o. male with medical history significant of CAD, DM2, chronic dissection of mesenteric artery.  Patient presents to ED with c/o left sided chest pain, generalized weakness, and SOB.  Symptoms onset this AM, persistent since onset.  Took ASA for symptoms.  Of note patient has recently taken himself off of most / all of his home meds including DM2 meds and HTN meds.   ED Course: Symptoms improved with SL NTG.  Initial trop neg, EKG unchanged (actually anterior TWI looks slightly improved compared to last month to me).  Initial BP 182 systolic, improves with SL NTG.   Review of Systems: As per HPI otherwise 10 point review of systems negative.   Past Medical History:  Diagnosis Date  . CAD (coronary artery disease)    a. LHC (02/26/14):  mLAD 20 (faint L>R collats), mCFX 50, pRCA 95 (plaque rupture) >> PCI:  Xience DES to pRCA (severe tortuosity of R innominate artery >> needs L radial or FA in future);  b. LHC (03/10/14):  CFX 90, oOM 70, pRCA stent patent >> PCI:  Xience DES to mCFX   . Carotid stenosis    a. Carotid US (02/27/14):  Bilateral ICA 1-39%  . Diabetes mellitus   . 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[place])    a. Mesenteric Artery Duplex (7/15):  pSMA chronic dissection with aneurysmal dilation of 1.29 cm (VVS);  b.  Chest CTA (02/26/14):  IMPRESSION:  1. No aortic dissection or other acute abnormality.  2. 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  3. Stable 17 mm ectasia of the left common iliac artery.  4. Atherosclerosis, including aortoiliac and coronary artery disease.   Marland Kitchen History of echocardiogram 09/2015   Echo 1/17: mod LVH, EF 55%, inf-lat HK, Gr 1 DD, mild LAE  . Hx of cardiovascular stress  test    Lexiscan Myoview (2/16):  Inferior lateral defect c/w thinning vs small prior infarct, no ischemia, EF 49%, Low Risk  . Hx of echocardiogram    a.  Echocardiogram (02/27/14):  Mod focal basal and mild concentric LVH, EF 50-55%, no RWMA, Gr 1 DD, mild TR, normal RVF  . Hyperlipidemia   . Hypertension   . Iliac artery aneurysm, left ([place])   . Myocardial infarction ([place])   . Stroke [name])     Past Surgical History:  Procedure Laterality Date  . Admission  09/26/2009   CVA.  [name].  Marland Kitchen CARDIAC CATHETERIZATION    . CARDIAC CATHETERIZATION N/A 06/25/2015   Procedure: Left Heart Cath and Coronary Angiography;  Surgeon: Burnell Blanks, MD;  Location: [facility] CV LAB;  Service: Cardiovascular;  Laterality: N/A;  . COLONOSCOPY WITH PROPOFOL N/A 07/23/2015   Procedure: COLONOSCOPY WITH PROPOFOL;  Surgeon: [name], MD;  Location: [facility];  Service: Endoscopy;  Laterality: N/A;  . CORONARY ANGIOPLASTY    . LEFT HEART CATHETERIZATION WITH CORONARY ANGIOGRAM N/A 02/26/2014   Procedure: LEFT HEART CATHETERIZATION WITH CORONARY ANGIOGRAM;  Surgeon: Wellington Hampshire, MD;  Location: [facility] [facility] CATH LAB;  Service: Cardiovascular;  Laterality: N/A;  . LEFT HEART CATHETERIZATION WITH CORONARY ANGIOGRAM N/A 03/10/2014   Procedure: LEFT HEART CATHETERIZATION WITH CORONARY ANGIOGRAM;  Surgeon: Jettie Booze, MD;  Location:  [place] CATH LAB;  Service: Cardiovascular;  Laterality: N/A;     reports that he quit smoking about 7 years ago. He has never used smokeless tobacco. He reports that he does not drink alcohol or use drugs.  No Known Allergies  Family History  Problem Relation Age of Onset  . Diabetes Father   . Hypertension Father   . Heart attack Father   . Stroke Neg Hx      Prior to Admission medications   Medication Sig Start Date End Date Taking? Authorizing Provider  aspirin 81 MG EC tablet Take 81 mg by mouth daily as needed.  02/12/15  Yes Larey Dresser, MD    Insulin Detemir (LEVEMIR FLEXTOUCH) 100 UNIT/ML Pen Inject 20 Units into the skin 2 (two) times daily. Patient taking differently: Inject 8 Units into the skin daily.  10/13/16  Yes Ghimire, Henreitta Leber, MD  nitroGLYCERIN (NITROSTAT) 0.4 MG SL tablet Place 1 tablet (0.4 mg total) under the tongue every 5 (five) minutes as needed for chest pain. 10/18/16  Yes Wendie Agreste, MD  VICTOZA 18 MG/3ML SOPN Inject 12 mg into the skin daily. 04/22/17  Yes [provider]  glucose monitoring kit (FREESTYLE) monitoring kit 1 each by Does not apply route 4 (four) times daily - after meals and at bedtime. 1 month Diabetic Testing Supplies for QAC-QHS accuchecks. Patient not taking: Reported on 03/28/2017 10/13/16   Jonetta Osgood, MD  Insulin Pen Needle 32G X 8 MM MISC novotwist 32x62m  Pen needles Use once as directed daily Patient not taking: Reported on 03/28/2017 12/17/16   Copland, JGay Filler MD  lisinopril (PRINIVIL,ZESTRIL) 10 MG tablet Take 1 tablet (10 mg total) by mouth daily. Patient not taking: Reported on 05/02/2017 10/19/16   WRichardson DoppT, PA-C  metFORMIN (GLUCOPHAGE) 500 MG tablet Take 1 tablet (500 mg total) by mouth 2 (two) times daily with a meal. Patient not taking: Reported on 05/02/2017 10/26/16   Copland, JGay Filler MD  ondansetron (ZOFRAN) 4 MG tablet Take 1 tablet (4 mg total) by mouth every 8 (eight) hours as needed for nausea or vomiting. Patient not taking: Reported on 05/02/2017 11/17/16   Tegeler, CGwenyth Allegra MD  rosuvastatin (CRESTOR) 40 MG tablet Take 1 tablet (40 mg total) by mouth daily. Patient not taking: Reported on 05/02/2017 10/19/16 10/14/17  WRichardson DoppT, PA-C    Physical Exam: Vitals:   05/02/17 2345 05/03/17 0000 05/03/17 0015 05/03/17 0045  BP: (!) 154/107 (!) 145/102 125/85 (!) 149/89  Pulse: 95 79 81 69  Resp: 18 15 17 12   Temp:      SpO2: 98% 97% 98% 96%    Constitutional: NAD, calm, comfortable Eyes: PERRL, lids and conjunctivae normal ENMT: Mucous  membranes are moist. Posterior pharynx clear of any exudate or lesions.Normal dentition.  Neck: normal, supple, no masses, no thyromegaly Respiratory: clear to auscultation bilaterally, no wheezing, no crackles. Normal respiratory effort. No accessory muscle use.  Cardiovascular: Regular rate and rhythm, no murmurs / rubs / gallops. No extremity edema. 2+ pedal pulses. No carotid bruits.  Abdomen: no tenderness, no masses palpated. No hepatosplenomegaly. Bowel sounds positive.  Musculoskeletal: no clubbing / cyanosis. No joint deformity upper and lower extremities. Good ROM, no contractures. Normal muscle tone.  Skin: no rashes, lesions, ulcers. No induration Neurologic: CN 2-12 grossly intact. Sensation intact, DTR normal. Strength 5/5 in all 4.  Psychiatric: Normal judgment and insight. Alert and oriented x 3. Normal mood.    Labs  on Admission: I have personally reviewed following labs and imaging studies  CBC:  Recent Labs Lab 05/02/17 2015  WBC 7.5  HGB 15.6  HCT 47.5  MCV 85.3  PLT 517   Basic Metabolic Panel:  Recent Labs Lab 05/02/17 2015  NA 138  K 3.4*  CL 100*  CO2 27  GLUCOSE 160*  BUN 12  CREATININE 1.20  CALCIUM 9.7   GFR: CrCl cannot be calculated (Unknown ideal weight.). Liver Function Tests: No results for input(s): AST, ALT, ALKPHOS, BILITOT, PROT, ALBUMIN in the last 168 hours. No results for input(s): LIPASE, AMYLASE in the last 168 hours. No results for input(s): AMMONIA in the last 168 hours. Coagulation Profile: No results for input(s): INR, PROTIME in the last 168 hours. Cardiac Enzymes: No results for input(s): CKTOTAL, CKMB, CKMBINDEX, TROPONINI in the last 168 hours. BNP (last 3 results) No results for input(s): PROBNP in the last 8760 hours. HbA1C: No results for input(s): HGBA1C in the last 72 hours. CBG: No results for input(s): GLUCAP in the last 168 hours. Lipid Profile: No results for input(s): CHOL, HDL, LDLCALC, TRIG, CHOLHDL,  LDLDIRECT in the last 72 hours. Thyroid Function Tests: No results for input(s): TSH, T4TOTAL, FREET4, T3FREE, THYROIDAB in the last 72 hours. Anemia Panel: No results for input(s): VITAMINB12, FOLATE, FERRITIN, TIBC, IRON, RETICCTPCT in the last 72 hours. Urine analysis:    Component Value Date/Time   COLORURINE STRAW (A) 03/28/2017 0949   APPEARANCEUR CLEAR 03/28/2017 0949   LABSPEC 1.009 03/28/2017 0949   PHURINE 7.0 03/28/2017 0949   GLUCOSEU 150 (A) 03/28/2017 0949   HGBUR NEGATIVE 03/28/2017 0949   BILIRUBINUR NEGATIVE 03/28/2017 0949   BILIRUBINUR negative 10/11/2016 1227   BILIRUBINUR neg 07/07/2014 1908   KETONESUR NEGATIVE 03/28/2017 0949   PROTEINUR NEGATIVE 03/28/2017 0949   UROBILINOGEN 0.2 10/11/2016 1227   UROBILINOGEN 0.2 01/12/2015 1121   NITRITE NEGATIVE 03/28/2017 0949   LEUKOCYTESUR NEGATIVE 03/28/2017 0949    Radiological Exams on Admission: Dg Chest 2 View  Result Date: 05/02/2017 CLINICAL DATA:  Dyspnea and chest pain without fever.  Slight cough. EXAM: CHEST  2 VIEW COMPARISON:  03/28/2017 CXR FINDINGS: The heart size and mediastinal contours are within normal limits. Mild aortic atherosclerosis at the arch. No aneurysm. Both lungs are clear. The visualized skeletal structures are unremarkable. IMPRESSION: No active cardiopulmonary disease.  Aortic atherosclerosis. Electronically Signed   By: Ashley Royalty M.D.   On: 05/02/2017 21:07    EKG: Independently reviewed.  Assessment/Plan Active Problems:   Chest pain, rule out acute myocardial infarction    1. CP r/o - 1. CP obs pathway 2. Serial trops 3. Tele monitor 4. NTG PRN pain since this helped.  Alternatively this could just represent HTN urgency with initial BP in ED measured at 201, this in setting of patient taking himself off of all of his home BP meds. 2. HTN - possibly HTN urgency 1. Resume all home BP meds 2. Add hydralazine PRN  DVT prophylaxis: Lovenox Code Status: Full Family  Communication: Family at bedside, Son acts as Optometrist for patient who speaks [place] Disposition Plan: Home after admit Consults called: None Admission status: Place in [place], [place] Hospitalists Pager [phone]  If 7AM-7PM, please contact day team taking care of patient [email or web address] Password [email or web address]  05/03/2017, 1:05 AM

## 2017-05-03 NOTE — ED Provider Notes (Signed)
[facility] DEPT Provider Note   CSN: [id number] Arrival date & time: 05/02/17  2005     History   Chief Complaint Chief Complaint  Patient presents with  . Chest Pain    HPI Barry [name] is a 66 y.o. male.  HPI Patient presents to the emergency department with chest pain that started earlier this morning.  The patient is also had shortness of breath.  Patient has a cardiac history and states this feels similar to previous episodes.  The patient states that been fairly constant.  States shortness of breath seems worse with exertion.  Patient states that he has not been taking a lot of his medications because he does not feel well and he takes them.  He states that he still takes his diabetes medicationsThe patient denies headache,blurred vision, neck pain, fever, cough, weakness, numbness, dizziness, anorexia, edema, abdominal pain, nausea, vomiting, diarrhea, rash, back pain, dysuria, hematemesis, bloody stool, near syncope, or syncope. Past Medical History:  Diagnosis Date  . CAD (coronary artery disease)    a. LHC (02/26/14):  mLAD 20 (faint L>R collats), mCFX 50, pRCA 95 (plaque rupture) >> PCI:  Xience DES to pRCA (severe tortuosity of R innominate artery >> needs L radial or FA in future);  b. LHC (03/10/14):  CFX 90, oOM 70, pRCA stent patent >> PCI:  Xience DES to mCFX   . Carotid stenosis    a. Carotid US (02/27/14):  Bilateral ICA 1-39%  . Diabetes mellitus   . 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[place])    a. Mesenteric Artery Duplex (7/15):  pSMA chronic dissection with aneurysmal dilation of 1.29 cm (VVS);  b.  Chest CTA (02/26/14):  IMPRESSION:  1. No aortic dissection or other acute abnormality.  2. 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  3. Stable 17 mm ectasia of the left common iliac artery.  4. Atherosclerosis, including aortoiliac and coronary artery disease.   Marland Kitchen History of echocardiogram 09/2015   Echo 1/17: mod LVH, EF 55%, inf-lat HK, Gr 1 DD, mild LAE  . Hx of  cardiovascular stress test    Lexiscan Myoview (2/16):  Inferior lateral defect c/w thinning vs small prior infarct, no ischemia, EF 49%, Low Risk  . Hx of echocardiogram    a.  Echocardiogram (02/27/14):  Mod focal basal and mild concentric LVH, EF 50-55%, no RWMA, Gr 1 DD, mild TR, normal RVF  . Hyperlipidemia   . Hypertension   . Iliac artery aneurysm, left ([place])   . Myocardial infarction ([place])   . Stroke [facility]     Patient Active Problem List   Diagnosis Date Noted  . Chest pain, rule out acute myocardial infarction 05/03/2017  . Atherosclerotic peripheral vascular disease with ulceration ([place]) 04/03/2015  . Special screening for malignant neoplasms, colon 05/07/2014  . Coronary artery disease involving native coronary artery of native heart without angina pectoris 03/28/2014  . Aneurysm of iliac artery ([place]) 03/18/2014  . Abdominal pain, unspecified site 03/18/2014  . Syncope in setting of nausea and vomiting 03/07/14 03/08/2014  . Non compliance with medical treatment 02/28/2014  . Dyslipidemia, goal LDL below 70 02/28/2014  . Bradycardia- beta blocker decreased 02/28/2014  . Chronic diastolic CHF (congestive heart failure) ([place]) 02/28/2014  . Type 2 diabetes mellitus with manifestations ([place]) 02/27/2014  . TIA (transient ischemic attack) 02/26/2014  . History of non-ST elevation myocardial infarction (NSTEMI) 02/26/2014  . Pain in limb-Abdomen  12/06/2013  . Weakness-Left arm / Lef 12/06/2013  . Mesenteric artery stenosis (  [place]) 12/06/2013  . 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[place]) 12/03/2012  . Hypertension 11/10/2011  . History of CVA in adulthood 11/10/2011  . Language barrier 11/10/2011    Past Surgical History:  Procedure Laterality Date  . Admission  09/26/2009   CVA.  [name].  Marland Kitchen CARDIAC CATHETERIZATION    . CARDIAC CATHETERIZATION N/A 06/25/2015   Procedure: Left Heart Cath and Coronary Angiography;  Surgeon: Burnell Blanks, MD;  Location: [facility] CV  LAB;  Service: Cardiovascular;  Laterality: N/A;  . COLONOSCOPY WITH PROPOFOL N/A 07/23/2015   Procedure: COLONOSCOPY WITH PROPOFOL;  Surgeon: [name], MD;  Location: [facility] ENDOS[facility];  Service: Endoscopy;  Laterality: N/A;  . CORONARY ANGIOPLASTY    . LEFT HEART CATHETERIZATION WITH CORONARY ANGIOGRAM N/A 02/26/2014   Procedure: LEFT HEART CATHETERIZATION WITH CORONARY ANGIOGRAM;  Surgeon: Wellington Hampshire, MD;  Location: [facility] CATH LAB;  Service: Cardiovascular;  Laterality: N/A;  . LEFT HEART CATHETERIZATION WITH CORONARY ANGIOGRAM N/A 03/10/2014   Procedure: LEFT HEART CATHETERIZATION WITH CORONARY ANGIOGRAM;  Surgeon: Jettie Booze, MD;  Location: [facility] CATH LAB;  Service: Cardiovascular;  Laterality: N/A;       Home Medications    Prior to Admission medications   Medication Sig Start Date End Date Taking? Authorizing Provider  aspirin 81 MG EC tablet Take 81 mg by mouth daily as needed.  02/12/15  Yes Larey Dresser, MD  Insulin Detemir (LEVEMIR FLEXTOUCH) 100 UNIT/ML Pen Inject 20 Units into the skin 2 (two) times daily. Patient taking differently: Inject 8 Units into the skin daily.  10/13/16  Yes Ghimire, Henreitta Leber, MD  nitroGLYCERIN (NITROSTAT) 0.4 MG SL tablet Place 1 tablet (0.4 mg total) under the tongue every 5 (five) minutes as needed for chest pain. 10/18/16  Yes Wendie Agreste, MD  VICTOZA 18 MG/3ML SOPN Inject 12 mg into the skin daily. 04/22/17  Yes [provider]  glucose monitoring kit (FREESTYLE) monitoring kit 1 each by Does not apply route 4 (four) times daily - after meals and at bedtime. 1 month Diabetic Testing Supplies for QAC-QHS accuchecks. Patient not taking: Reported on 03/28/2017 10/13/16   Jonetta Osgood, MD  Insulin Pen Needle 32G X 8 MM MISC novotwist 32x73m  Pen needles Use once as directed daily Patient not taking: Reported on 03/28/2017 12/17/16   Copland, JGay Filler MD  lisinopril (PRINIVIL,ZESTRIL) 10 MG tablet Take 1 tablet (10 mg total)  by mouth daily. Patient not taking: Reported on 05/02/2017 10/19/16   WRichardson DoppT, PA-C  metFORMIN (GLUCOPHAGE) 500 MG tablet Take 1 tablet (500 mg total) by mouth 2 (two) times daily with a meal. Patient not taking: Reported on 05/02/2017 10/26/16   Copland, JGay Filler MD  ondansetron (ZOFRAN) 4 MG tablet Take 1 tablet (4 mg total) by mouth every 8 (eight) hours as needed for nausea or vomiting. Patient not taking: Reported on 05/02/2017 11/17/16   Tegeler, CGwenyth Allegra MD  rosuvastatin (CRESTOR) 40 MG tablet Take 1 tablet (40 mg total) by mouth daily. Patient not taking: Reported on 05/02/2017 10/19/16 10/14/17  WLiliane Shi PA-C    Family History Family History  Problem Relation Age of Onset  . Diabetes Father   . Hypertension Father   . Heart attack Father   . Stroke Neg Hx     Social History Social History  Substance Use Topics  . Smoking status: Former Smoker    Quit date: 06/09/2009  . Smokeless tobacco: Never Used  .  Alcohol use No     Allergies   Patient has no known allergies.   Review of Systems Review of Systems All other systems negative except as documented in the HPI. All pertinent positives and negatives as reviewed in the HPI.  Physical Exam Updated Vital Signs BP (!) 149/89   Pulse 69   Temp 98.1 F (36.7 C)   Resp 12   SpO2 96%   Physical Exam  Constitutional: He is oriented to person, place, and time. He appears well-developed and well-nourished. No distress.  HENT:  Head: Normocephalic and atraumatic.  Mouth/Throat: Oropharynx is clear and moist.  Eyes: Pupils are equal, round, and reactive to light.  Neck: Normal range of motion. Neck supple.  Cardiovascular: Normal rate, regular rhythm and normal heart sounds.  Exam reveals no gallop and no friction rub.   No murmur heard. Pulmonary/Chest: Effort normal and breath sounds normal. No respiratory distress. He has no wheezes.  Abdominal: Soft. Bowel sounds are normal. He exhibits no distension.  There is no tenderness.  Neurological: He is alert and oriented to person, place, and time. He exhibits normal muscle tone. Coordination normal.  Skin: Skin is warm and dry. Capillary refill takes less than 2 seconds. No rash noted. No erythema.  Psychiatric: He has a normal mood and affect. His behavior is normal.  Nursing note and vitals reviewed.    ED Treatments / Results  Labs (all labs ordered are listed, but only abnormal results are displayed) Labs Reviewed  BASIC METABOLIC PANEL - Abnormal; Notable for the following:       Result Value   Potassium 3.4 (*)    Chloride 100 (*)    Glucose, Bld 160 (*)    All other components within normal limits  CBC  TROPONIN I  TROPONIN I  TROPONIN I  I-STAT TROPONIN, ED    EKG  EKG Interpretation None       Radiology Dg Chest 2 View  Result Date: 05/02/2017 CLINICAL DATA:  Dyspnea and chest pain without fever.  Slight cough. EXAM: CHEST  2 VIEW COMPARISON:  03/28/2017 CXR FINDINGS: The heart size and mediastinal contours are within normal limits. Mild aortic atherosclerosis at the arch. No aneurysm. Both lungs are clear. The visualized skeletal structures are unremarkable. IMPRESSION: No active cardiopulmonary disease.  Aortic atherosclerosis. Electronically Signed   By: Ashley Royalty M.D.   On: 05/02/2017 21:07    Procedures Procedures (including critical care time)  Medications Ordered in ED Medications  nitroGLYCERIN (NITROSTAT) SL tablet 0.4 mg (0.4 mg Sublingual Given 05/02/17 2307)  Insulin Detemir (LEVEMIR) FlexPen 8 Units (not administered)  rosuvastatin (CRESTOR) tablet 40 mg (not administered)  aspirin EC tablet 81 mg (not administered)  ondansetron (ZOFRAN) tablet 4 mg (not administered)  lisinopril (PRINIVIL,ZESTRIL) tablet 10 mg (not administered)  acetaminophen (TYLENOL) tablet 650 mg (not administered)  ondansetron (ZOFRAN) injection 4 mg (not administered)  enoxaparin (LOVENOX) injection 40 mg (not administered)    hydrALAZINE (APRESOLINE) injection 5-10 mg (not administered)     Initial Impression / Assessment and Plan / ED Course  I have reviewed the triage vital signs and the nursing notes.  Pertinent labs & imaging results that were available during my care of the patient were reviewed by me and considered in my medical decision making (see chart for details).     She will need admission for cardiac workup due to his extensive history and comorbidities.  The patient is advised plan and all questions were answered.  I spoke with the [facility] hospitalist, who will admit the patient  Final Clinical Impressions(s) / ED Diagnoses   Final diagnoses:  Unstable angina pectoris [facility])    New Prescriptions New Prescriptions   No medications on file     Dalia Heading, Hershal Coria 05/04/17 0131    Quintella Reichert, MD 05/05/17 [phone]

## 2017-05-03 NOTE — Progress Notes (Signed)
Patient admitted after midnight.  Please see H&P.  Patient has been not taking his BP Medications-- appears to be from misunderstanding.  Resume home meds and add norvasc.  Suspect home in the AM if not later this PM. Eulogio Bear DO

## 2017-05-04 ENCOUNTER — Telehealth: Payer: Self-pay | Admitting: Family Medicine

## 2017-05-04 ENCOUNTER — Telehealth: Payer: Self-pay | Admitting: Medical

## 2017-05-04 DIAGNOSIS — I10 Essential (primary) hypertension: Secondary | ICD-10-CM

## 2017-05-04 DIAGNOSIS — E1140 Type 2 diabetes mellitus with diabetic neuropathy, unspecified: Secondary | ICD-10-CM

## 2017-05-04 DIAGNOSIS — E118 Type 2 diabetes mellitus with unspecified complications: Secondary | ICD-10-CM

## 2017-05-04 DIAGNOSIS — R079 Chest pain, unspecified: Secondary | ICD-10-CM | POA: Diagnosis not present

## 2017-05-04 DIAGNOSIS — Z794 Long term (current) use of insulin: Principal | ICD-10-CM

## 2017-05-04 LAB — BASIC METABOLIC PANEL
Anion gap: 8 (ref 5–15)
BUN: 17 mg/dL (ref 6–20)
CO2: 28 mmol/L (ref 22–32)
Calcium: 9 mg/dL (ref 8.9–10.3)
Chloride: 101 mmol/L (ref 101–111)
Creatinine, Ser: 1.15 mg/dL (ref 0.61–1.24)
GFR calc Af Amer: >60 mL/min (ref >60)
GFR calc non Af Amer: >60 mL/min (ref >60)
Glucose, Bld: 158 mg/dL — ABNORMAL HIGH (ref 65–99)
Potassium: 3.5 mmol/L (ref 3.5–5.1)
Sodium: 137 mmol/L (ref 135–145)

## 2017-05-04 LAB — GLUCOSE, CAPILLARY
Glucose-Capillary: 111 mg/dL — ABNORMAL HIGH (ref 65–99)
Glucose-Capillary: 87 mg/dL (ref 65–99)

## 2017-05-04 MED ORDER — VICTOZA 18 MG/3ML ~~LOC~~ SOPN: 1.2000 mg | PEN_INJECTOR | Freq: Every day | SUBCUTANEOUS | Status: DC

## 2017-05-04 MED ORDER — AMLODIPINE BESYLATE 5 MG PO TABS: 5.0000 mg | ORAL_TABLET | Freq: Every day | ORAL | 0 refills | Status: DC

## 2017-05-04 MED ORDER — INSULIN DETEMIR 100 UNIT/ML FLEXPEN
8.0000 [IU] | PEN_INJECTOR | Freq: Every day | SUBCUTANEOUS | Status: DC
Start: 2017-05-04 — End: 2017-05-05

## 2017-05-04 MED ORDER — ASPIRIN 81 MG PO TBEC: 81.0000 mg | DELAYED_RELEASE_TABLET | Freq: Every day | ORAL | Status: DC

## 2017-05-04 NOTE — Consult Note (Signed)
   [facility] CM Inpatient Consult   05/04/2017  Barry [name] 1951-07-06 [id number]     [facility] Care Management referral received from inpatient [facility] for DM management.   Went to bedside to speak with patient and wife. Used [place] interpreter line as well. Discussed [facility] Care Management follow up for disease and symptom management for DM. Also discussed importance of adhering to medications and the like.  Written consent obtained for [place] Management services. Spoke with son, Pros Chas, with patient's permission, who states his father could benefit from [place] Management services as well. Pros states he should be called for post discharge follow up at [phone]. [name] is agreeable to this.  Discussed importance of making post hospital discharge follow up appointment with Primary Care Provider, Dr. Lorelei Pont. Son is agreeable to this.   Denies issues with obtaining medications or with transportation. Both son and wife state that [name] needs reinforcement in adhering to medications and managing DM. Wife reports she is happy someone will follow up her husband as she works during the day.  Provided [facility] Care Management packet, contact information, 24-hr nurse line magnet. Discussed the contents of packet with son and interpreter.  Made inpatient RNCM aware that [facility] Management to follow post discharge.  Will notify Dr. Lillie Fragmin office of [place] Management involvement.   [name] expresses appreciation of services.  Marthenia Rolling, MSN-Ed, RN,BSN [facility] Liaison [phone]

## 2017-05-04 NOTE — Progress Notes (Signed)
Reviewed discharge instructions with patient's son Fleetwood Toback Brooke Bonito. over telephone, no [place] at available, and he stated his understanding. Son stated he would call and review with patient and wife. Discharged home with wife via wheelchair.  Sanda Linger

## 2017-05-04 NOTE — Discharge Summary (Signed)
Physician Discharge Summary  Barry [name] KJI:[id number] DOB: Feb 28, 1951 DOA: 05/02/2017  PCP: Darreld Mclean, MD  Admit date: 05/02/2017 Discharge date: 05/04/2017   Recommendations for Outpatient Follow-Up:   1. Home health RN   Discharge Diagnosis:   Active Problems:   Chest pain, rule out acute myocardial infarction   Discharge disposition:  Home  Discharge Condition: Improved.  Diet recommendation: Low sodium, heart healthy  Wound care: None.   History of Present Illness:   Barry [name] is a 66 y.o. male with medical history significant of CAD, DM2, chronic dissection of mesenteric artery.  Patient presents to ED with c/o left sided chest pain, generalized weakness, and SOB.  Symptoms onset this AM, persistent since onset.  Took ASA for symptoms.  Of note patient has recently taken himself off of most / all of his home meds including DM2 meds and HTN meds.   Hospital Course by Problem:   CP r/o - CE negative Outpatient cardiology referral   DM  -continue home meds  HTN -  Resume all home BP meds      Medical Consultants:    None.   Discharge Exam:   Vitals:   05/03/17 2019 05/04/17 0420  BP: 135/79 118/73  Pulse: 66   Resp: 18 16  Temp: 97.9 F (36.6 C) 98.3 F (36.8 C)   Vitals:   05/03/17 1420 05/03/17 1758 05/03/17 2019 05/04/17 0420  BP: (!) 148/80 (!) 153/79 135/79 118/73  Pulse: 74  66   Resp: _0 Temp: 98.2 F (36.8 C)  97.9 F (36.6 C) 98.3 F (36.8 C)  TempSrc: Oral  Oral Oral  SpO2: 99%  98% 97%  Weight: 69.5 kg (153 lb 4.8 oz)   70.9 kg (156 lb 3.2 oz)    Gen:  NAD   The results of significant diagnostics from this hospitalization (including imaging, microbiology, ancillary and laboratory) are listed below for reference.     Procedures and Diagnostic Studies:   Dg Chest 2 View  Result Date: 05/02/2017 CLINICAL DATA:  Dyspnea and chest pain without fever.  Slight cough. EXAM: CHEST  2 VIEW COMPARISON:   03/28/2017 CXR FINDINGS: The heart size and mediastinal contours are within normal limits. Mild aortic atherosclerosis at the arch. No aneurysm. Both lungs are clear. The visualized skeletal structures are unremarkable. IMPRESSION: No active cardiopulmonary disease.  Aortic atherosclerosis. Electronically Signed   By: Ashley Royalty M.D.   On: 05/02/2017 21:07     Labs:   Basic Metabolic Panel:  Recent Labs Lab 05/02/17 2015 05/04/17 0239  NA 138 137  K 3.4* 3.5  CL 100* 101  CO2 27 28  GLUCOSE 160* 158*  BUN 12 17  CREATININE 1.20 1.15  CALCIUM 9.7 9.0   GFR Estimated Creatinine Clearance: 58.3 mL/min (by C-G formula based on SCr of 1.15 mg/dL). Liver Function Tests: No results for input(s): AST, ALT, ALKPHOS, BILITOT, PROT, ALBUMIN in the last 168 hours. No results for input(s): LIPASE, AMYLASE in the last 168 hours. No results for input(s): AMMONIA in the last 168 hours. Coagulation profile No results for input(s): INR, PROTIME in the last 168 hours.  CBC:  Recent Labs Lab 05/02/17 2015  WBC 7.5  HGB 15.6  HCT 47.5  MCV 85.3  PLT 214   Cardiac Enzymes:  Recent Labs Lab 05/03/17 0134 05/03/17 0431 05/03/17 0719  TROPONINI <0.03 <0.03 <0.03   BNP: Invalid input(s): POCBNP CBG:  Recent Labs Lab 05/03/17 1426 05/03/17  1647 05/03/17 2209 05/04/17 0726  GLUCAP 136* 109* 160* 111*   D-Dimer No results for input(s): DDIMER in the last 72 hours. Hgb A1c No results for input(s): HGBA1C in the last 72 hours. Lipid Profile No results for input(s): CHOL, HDL, LDLCALC, TRIG, CHOLHDL, LDLDIRECT in the last 72 hours. Thyroid function studies No results for input(s): TSH, T4TOTAL, T3FREE, THYROIDAB in the last 72 hours.  Invalid input(s): FREET3 Anemia work up No results for input(s): VITAMINB12, FOLATE, FERRITIN, TIBC, IRON, RETICCTPCT in the last 72 hours. Microbiology No results found for this or any previous visit (from the past 240  hour(s)).   Discharge Instructions:   Discharge Instructions    Diet - low sodium heart healthy    Complete by:  As directed    Discharge instructions    Complete by:  As directed    Outpatient cardiology follow up Take BP medications   Increase activity slowly    Complete by:  As directed      Allergies as of 05/04/2017   No Known Allergies     Medication List    STOP taking these medications   metFORMIN 500 MG tablet Commonly known as:  GLUCOPHAGE     TAKE these medications   amLODipine 5 MG tablet Commonly known as:  NORVASC Take 1 tablet (5 mg total) by mouth daily.   aspirin 81 MG EC tablet Take 81 mg by mouth daily as needed.   glucose monitoring kit monitoring kit 1 each by Does not apply route 4 (four) times daily - after meals and at bedtime. 1 month Diabetic Testing Supplies for QAC-QHS accuchecks.   Insulin Detemir 100 UNIT/ML Pen Commonly known as:  LEVEMIR FLEXTOUCH Inject 8 Units into the skin daily.   Insulin Pen Needle 32G X 8 MM Misc novotwist 32x28m  Pen needles Use once as directed daily   lisinopril 10 MG tablet Commonly known as:  PRINIVIL,ZESTRIL Take 1 tablet (10 mg total) by mouth daily.   nitroGLYCERIN 0.4 MG SL tablet Commonly known as:  NITROSTAT Place 1 tablet (0.4 mg total) under the tongue every 5 (five) minutes as needed for chest pain.   ondansetron 4 MG tablet Commonly known as:  ZOFRAN Take 1 tablet (4 mg total) by mouth every 8 (eight) hours as needed for nausea or vomiting.   rosuvastatin 40 MG tablet Commonly known as:  CRESTOR Take 1 tablet (40 mg total) by mouth daily.   VICTOZA 18 MG/3ML Sopn Generic drug:  liraglutide Inject 0.2 mLs (1.2 mg total) into the skin daily. What changed:  how much to take      Follow-up Information    Copland, JGay Filler MD Follow up in 1 week(s).   Specialty:  Family Medicine Contact information: [address] [phone]        WRichardson DoppT, PA-C Follow up in 2 week(s).   Specialties:  Cardiology, Physician Assistant Contact information: [phone]            Time coordinating discharge: 35 min  Signed:  Kyian Obst UAlison Stalling  [facility] 05/04/2017, 8:39 AM

## 2017-05-04 NOTE — Telephone Encounter (Addendum)
Caller Name: Josean,Pros Relation to pt:son Call back number:[phone] Pharmacy:  [facility] ([address]), [place] [phone] (Phone) [phone] (Fax)     Reason for call:  Son checking requesting refill Insulin Detemir (LEVEMIR FLEXTOUCH) 100 UNIT/ML Pen, VICTOZA 18 MG/3ML SOPN to hold patient over until 05/08/17 appointment, please advise if Rx can be sent today

## 2017-05-04 NOTE — Care Management Obs Status (Signed)
[facility] NOTIFICATION   Patient Details  Name: Barry [name] MRN: [id number] Date of Birth: 28-May-1951   Medicare Observation Status Notification Given:  Yes    Erenest Rasher, RN 05/04/2017, 9:05 AM

## 2017-05-04 NOTE — Telephone Encounter (Signed)
Opened to review 

## 2017-05-04 NOTE — Telephone Encounter (Signed)
I got a refill request to fill pt levemir and his victoza. I have never seen him before. He was admitted to hospital recently for unstable angina. His sugars were 195. He had hx of sugars in past close to 1000 and was admitted but also he had hypoglycemic event in past on chart review. I typcally don't rx victoza with levemir. Is Dr. Lorelei Pont available. Can you send her the refill request for both meds. She has seen him before. I did not see recent a1c in the chart. Please give me answer by tomorrow.   Also his the levemir instruction and victoza are on his discharge summary. You could point out that to [facility] as well please.  Please give me update tomorrow in am if you were able to send Dr. Lorelei Pont a message.  Note reviewed this request and chart review after hours on late day. Unable to contact and discuss with staff while seeing patients.

## 2017-05-04 NOTE — Care Management Note (Addendum)
Case Management Note  Patient Details  Name: Barry [name] MRN: [id number] Date of Birth: [birth year]  Subjective/Objective:  Chest pain                  Action/Plan: Discharge Planning: NCM spoke to pt at bedside. Lives at home with wife. NCM spoke to son, Pros [phone] on the phone per pt's permission. States he can afford his medication. Son is requesting [facility], offered choice for HH/list provided. States he had HH in the past but cannot remember name. [facility] consult. Will arranged with [facility].   PCP Lamar Blinks C MD   Expected Discharge Date:  05/04/17               Expected Discharge Plan:  Home/Self Care  In-House Referral:  NA  Discharge planning Services  CM Consult  Post Acute Care Choice:  NA Choice offered to:  NA  DME Arranged:  N/A DME Agency:  NA  HH Arranged:  NA HH Agency:  NA  Status of Service:  Completed, signed off  If discussed at [place] of Stay Meetings, dates discussed:    Additional Comments:  Erenest Rasher, RN 05/04/2017, 9:09 AM

## 2017-05-04 NOTE — Plan of Care (Signed)
Problem: Pain Managment: Goal: General experience of comfort will improve Outcome: Progressing Patient has had no complaints of pain this shift.  Problem: Physical Regulation: Goal: Ability to maintain clinical measurements within normal limits will improve Outcome: Progressing Vital signs stable. Per notes, home regiment of medication ordered.

## 2017-05-05 ENCOUNTER — Other Ambulatory Visit: Payer: Self-pay

## 2017-05-05 ENCOUNTER — Telehealth: Payer: Self-pay | Admitting: Behavioral Health

## 2017-05-05 ENCOUNTER — Other Ambulatory Visit: Payer: Medicare Other

## 2017-05-05 MED ORDER — VICTOZA 18 MG/3ML ~~LOC~~ SOPN: 1.2000 mg | PEN_INJECTOR | Freq: Every day | SUBCUTANEOUS | 2 refills | Status: DC

## 2017-05-05 MED ORDER — INSULIN DETEMIR 100 UNIT/ML FLEXPEN: 8.0000 [IU] | PEN_INJECTOR | Freq: Every day | SUBCUTANEOUS | 2 refills | Status: DC

## 2017-05-05 NOTE — Patient Outreach (Signed)
Transition of care:  Placed call to contact person Pros Lamount ( son) who reports that patient is doing much better. Reports that patient is taking his medications as prescribed. Reports currently out of insulin but will pick up at [facility] today.  Son states that he is a Administrator and will not be available for 2 months.  Currently driving as I am talking with him on the phone. Son states that patient has his follow up appointments an transportation to appointments.   Booked home visit for 05/09/2017 at 11am.  Son to inform patient.  PLAN: Home visit to review medications and assess for needs.  Email sent to [employer] to request an interpreter to come to home visit.   Confirmed address and provided my contact information.  No care plan at this time until I can speak with patient.  Son is driving.   [name], RN, BSN, CEN [facility] [employer] [phone]

## 2017-05-05 NOTE — Addendum Note (Signed)
Addended by: Lamar Blinks C on: 05/05/2017 06:41 AM   Modules accepted: Orders

## 2017-05-05 NOTE — Telephone Encounter (Signed)
Please advise    PC 

## 2017-05-05 NOTE — Progress Notes (Signed)
[place] [facility] [address] [place], [place] [address] [phone]  Date:  05/08/2017   Name:  Barry [name]   DOB:  April 09, 1951   MRN:  [id number]  PCP:  Barry Mclean, MD    Chief Complaint: Follow-up   History of Present Illness:  Barry [name] is a 66 y.o. very pleasant male patient who presents with the following:  Here today for a hospital follow up visit. History of medical non- compliance, poorly controlled DM I last saw him in January after her had been in the hospital with [facility]; I requested a one month follow-up visit at that time.    He was most recently in the hospital from 8/7 to 8/9, as below:   Admit date: 05/02/2017 Discharge date: 05/04/2017   Recommendations for Outpatient Follow-Up:   1. Home health RN   Discharge Diagnosis:   Active Problems:   Chest pain, rule out acute myocardial infarction  History of Present Illness:   Barry [name] a 66 y.o.malewith medical history significant of CAD, DM2, chronic dissection of mesenteric artery. Patient presents to ED with c/o left sided chest pain, generalized weakness, and SOB. Symptoms onset this AM, persistent since onset. Took ASA for symptoms.  Of note patient has recently taken himself off of most / all of his home meds including DM2 meds and HTN meds.   Hospital Course by Problem:   CP r/o - CE negative Outpatient cardiology referral   DM             -continue home meds  HTN -  Resume all home BP meds  Medical Consultants:    None.   Discharge Exam:       Vitals:   05/03/17 2019 05/04/17 0420  BP: 135/79 118/73  Pulse: 66   Resp: 18 16  Temp: 97.9 F (36.6 C) 98.3 F (36.8 C)   Vitals:   05/03/17 1420 05/03/17 1758 05/03/17 2019 05/04/17 0420  BP: (!) 148/80 (!) 153/79 135/79 118/73  Pulse: 74  66   Resp: _0 Temp: 98.2 F (36.8 C)  97.9 F (36.6 C) 98.3 F (36.8 C)  TempSrc:  Oral  Oral Oral  SpO2: 99%  98% 97%  Weight: 69.5 kg (153 lb 4.8 oz)   70.9 kg (156 lb 3.2 oz)    Gen:  NAD   The results of significant diagnostics from this hospitalization (including imaging, microbiology, ancillary and laboratory) are listed below for reference.     Procedures and Diagnostic Studies:   Dg Chest 2 View  Result Date: 05/02/2017 CLINICAL DATA:  Dyspnea and chest pain without fever.  Slight cough. EXAM: CHEST  2 VIEW COMPARISON:  03/28/2017 CXR FINDINGS: The heart size and mediastinal contours are within normal limits. Mild aortic atherosclerosis at the arch. No aneurysm. Both lungs are clear. The visualized skeletal structures are unremarkable. IMPRESSION: No active cardiopulmonary disease.  Aortic atherosclerosis. Electronically Signed   By: Ashley Royalty M.D.   On: 05/02/2017 21:07     Labs:   Basic Metabolic Panel:  Last Labs    Recent Labs Lab 05/02/17 2015 05/04/17 0239  NA 138 137  K 3.4* 3.5  CL 100* 101  CO2 27 28  GLUCOSE 160* 158*  BUN 12 17  CREATININE 1.20 1.15  CALCIUM 9.7 9.0     GFR Estimated Creatinine Clearance: 58.3 mL/min (by C-G formula based on SCr of 1.15 mg/dL).  Liver Function Tests: Last Labs   No results for input(s): AST, ALT, ALKPHOS, BILITOT, PROT, ALBUMIN in the last 168 hours.   Last Labs   No results for input(s): LIPASE, AMYLASE in the last 168 hours.   Last Labs   No results for input(s): AMMONIA in the last 168 hours.   Coagulation profile Last Labs   No results for input(s): INR, PROTIME in the last 168 hours.    CBC:  Last Labs    Recent Labs Lab 05/02/17 2015  WBC 7.5  HGB 15.6  HCT 47.5  MCV 85.3  PLT 214     Cardiac Enzymes:  Last Labs    Recent Labs Lab 05/03/17 0134 05/03/17 0431 05/03/17 0719  TROPONINI <0.03 <0.03 <0.03     BNP: Last Labs   Invalid input(s): POCBNP   CBG:  Last Labs    Recent Labs Lab 05/03/17 1426 05/03/17 1647 05/03/17 2209  05/04/17 0726  GLUCAP 136* 109* 160* 111*     D-Dimer Recent Labs (last 2 labs)   No results for input(s): DDIMER in the last 72 hours.   Hgb A1c Recent Labs (last 2 labs)   No results for input(s): HGBA1C in the last 72 hours.   Lipid Profile Recent Labs (last 2 labs)   No results for input(s): CHOL, HDL, LDLCALC, TRIG, CHOLHDL, LDLDIRECT in the last 72 hours.   Thyroid function studies  Recent Labs (last 2 labs)   No results for input(s): TSH, T4TOTAL, T3FREE, THYROIDAB in the last 72 hours.  Invalid input(s): FREET3   Anemia work up [employer] (last 2 labs)   No results for input(s): VITAMINB12, FOLATE, FERRITIN, TIBC, IRON, RETICCTPCT in the last 72 hours.   Microbiology No results found for this or any previous visit (from the past 240 hour(s)).   Discharge Instructions:       Discharge Instructions    Diet - low sodium heart healthy    Complete by:  As directed    Discharge instructions    Complete by:  As directed    Outpatient cardiology follow up Take BP medications   Increase activity slowly    Complete by:  As directed      Allergies as of 05/04/2017   No Known Allergies             Medication List     STOP taking these medications   metFORMIN 500 MG tablet Commonly known as:  GLUCOPHAGE     TAKE these medications   amLODipine 5 MG tablet Commonly known as:  NORVASC Take 1 tablet (5 mg total) by mouth daily.   aspirin 81 MG EC tablet Take 81 mg by mouth daily as needed.   glucose monitoring kit monitoring kit 1 each by Does not apply route 4 (four) times daily - after meals and at bedtime. 1 month Diabetic Testing Supplies for QAC-QHS accuchecks.   Insulin Detemir 100 UNIT/ML Pen Commonly known as:  LEVEMIR FLEXTOUCH Inject 8 Units into the skin daily.   Insulin Pen Needle 32G X 8 MM Misc novotwist 32x22m  Pen needles Use once as directed daily   lisinopril 10 MG tablet Commonly known as:   PRINIVIL,ZESTRIL Take 1 tablet (10 mg total) by mouth daily.   nitroGLYCERIN 0.4 MG SL tablet Commonly known as:  NITROSTAT Place 1 tablet (0.4 mg total) under the tongue every 5 (five) minutes as needed for chest pain.   ondansetron 4 MG tablet Commonly known as:  ZOFRAN  Take 1 tablet (4 mg total) by mouth every 8 (eight) hours as needed for nausea or vomiting.   rosuvastatin 40 MG tablet Commonly known as:  CRESTOR Take 1 tablet (40 mg total) by mouth daily.   VICTOZA 18 MG/3ML Sopn Generic drug:  liraglutide Inject 0.2 mLs (1.2 mg total) into the skin daily. What changed:  how much to take         Follow-up Information    Copland, Gay Filler, MD Follow up in 1 week(s).   Specialty:  Family Medicine Contact information: [address] [place] [address] [phone]        Richardson Dopp T, PA-C Follow up in 2 week(s).   Specialties:  Cardiology, Physician Assistant Contact information: [address] [place] [address] [phone]     They will need to set up cardiology follow-up- he hast mostly seen Richardson Dopp in the past  Lab Results  Component Value Date   HGBA1C 14.0 10/11/2016   It does not look like he had an A1c during admission, [place] will also check this today.  He was not taking any of his home medications when he was admitted per note  I am not clear on why he was to stop metformin- will send message to inpt team  -addnd- heard back from Dr. Eliseo Squires from intp service.  They did not restart his metformin, but he is able to go back on this medication  He needs an eye exam, foot exam as well  His son interprets for him today He states that he is feeling a little bit tired still, but does not have any further CP or SOB I refilled his victoza and levemir but they have not picked them up yet.  I received an urgent message to refill this med on Friday afternoon which I did do for them.   He is not taking  crestor - they are not sure but think that this medication may have caused bloody diarrhea in the past.  However they are really not sure if this was the case or not. It may have been another medication "that you put under your tongue" (?nitro).  They did not being medications with them today [place] we are not sure  Patient Active Problem List   Diagnosis Date Noted  . Chest pain, rule out acute myocardial infarction 05/03/2017  . Atherosclerotic peripheral vascular disease with ulceration ([place]) 04/03/2015  . Special screening for malignant neoplasms, colon 05/07/2014  . Coronary artery disease involving native coronary artery of native heart without angina pectoris 03/28/2014  . Aneurysm of iliac artery ([place]) 03/18/2014  . Abdominal pain, unspecified site 03/18/2014  . Syncope in setting of nausea and vomiting 03/07/14 03/08/2014  . Non compliance with medical treatment 02/28/2014  . Dyslipidemia, goal LDL below 70 02/28/2014  . Bradycardia- beta blocker decreased 02/28/2014  . Chronic diastolic CHF (congestive heart failure) ([place]) 02/28/2014  . Type 2 diabetes mellitus with manifestations ([place]) 02/27/2014  . TIA (transient ischemic attack) 02/26/2014  . History of non-ST elevation myocardial infarction (NSTEMI) 02/26/2014  . Pain in limb-Abdomen  12/06/2013  . Weakness-Left arm / Lef 12/06/2013  . Mesenteric artery stenosis ([place]) 12/06/2013  . 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[place]) 12/03/2012  . Hypertension 11/10/2011  . History of CVA in adulthood 11/10/2011  . Language barrier 11/10/2011    Past Medical History:  Diagnosis Date  . CAD (coronary artery disease)    a. LHC (02/26/14):  mLAD 20 (faint L>R collats), mCFX 50, pRCA 95 (plaque rupture) >> PCI:  Xience DES to pRCA (severe tortuosity of R innominate artery >> needs L radial or FA in future);  b. LHC (03/10/14):  CFX 90, oOM 70, pRCA stent patent >> PCI:  Xience DES to mCFX   . Carotid stenosis    a. Carotid US (02/27/14):  Bilateral  ICA 1-39%  . Diabetes mellitus   . 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[place])    a. Mesenteric Artery Duplex (7/15):  pSMA chronic dissection with aneurysmal dilation of 1.29 cm (VVS);  b.  Chest CTA (02/26/14):  IMPRESSION:  1. No aortic dissection or other acute abnormality.  2. 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  3. Stable 17 mm ectasia of the left common iliac artery.  4. Atherosclerosis, including aortoiliac and coronary artery disease.   Marland Kitchen History of echocardiogram 09/2015   Echo 1/17: mod LVH, EF 55%, inf-lat HK, Gr 1 DD, mild LAE  . Hx of cardiovascular stress test    Lexiscan Myoview (2/16):  Inferior lateral defect c/w thinning vs small prior infarct, no ischemia, EF 49%, Low Risk  . Hx of echocardiogram    a.  Echocardiogram (02/27/14):  Mod focal basal and mild concentric LVH, EF 50-55%, no RWMA, Gr 1 DD, mild TR, normal RVF  . Hyperlipidemia   . Hypertension   . Iliac artery aneurysm, left ([place])   . Myocardial infarction ([place])   . Stroke [facility]) 09/26/2009    Past Surgical History:  Procedure Laterality Date  . CARDIAC CATHETERIZATION    . CARDIAC CATHETERIZATION N/A 06/25/2015   Procedure: Left Heart Cath and Coronary Angiography;  Surgeon: Burnell Blanks, MD;  Location: [facility] CV LAB;  Service: Cardiovascular;  Laterality: N/A;  . COLONOSCOPY WITH PROPOFOL N/A 07/23/2015   Procedure: COLONOSCOPY WITH PROPOFOL;  Surgeon: [name], MD;  Location: [facility] ENDOSCOPY;  Service: Endoscopy;  Laterality: N/A;  . CORONARY ANGIOPLASTY    . LEFT HEART CATHETERIZATION WITH CORONARY ANGIOGRAM N/A 02/26/2014   Procedure: LEFT HEART CATHETERIZATION WITH CORONARY ANGIOGRAM;  Surgeon: Wellington Hampshire, MD;  Location: [facility] CATH LAB;  Service: Cardiovascular;  Laterality: N/A;  . LEFT HEART CATHETERIZATION WITH CORONARY ANGIOGRAM N/A 03/10/2014   Procedure: LEFT HEART CATHETERIZATION WITH CORONARY ANGIOGRAM;  Surgeon: Jettie Booze, MD;  Location: [facility] CATH LAB;  Service:  Cardiovascular;  Laterality: N/A;    Social History  Substance Use Topics  . Smoking status: Former Smoker    Packs/day: 0.25    Years: 38.00    Types: Cigarettes    Quit date: 2008  . Smokeless tobacco: Never Used  . Alcohol use No    Family History  Problem Relation Age of Onset  . Diabetes Father   . Hypertension Father   . Heart attack Father   . Stroke Neg Hx     No Known Allergies  Medication list has been reviewed and updated.  Current Outpatient Prescriptions on File Prior to Visit  Medication Sig Dispense Refill  . amLODipine (NORVASC) 5 MG tablet Take 1 tablet (5 mg total) by mouth daily. 30 tablet 0  . glucose monitoring kit (FREESTYLE) monitoring kit 1 each by Does not apply route 4 (four) times daily - after meals and at bedtime. 1 month Diabetic Testing Supplies for QAC-QHS accuchecks. 1 each 1  . Insulin Detemir (LEVEMIR FLEXTOUCH) 100 UNIT/ML Pen Inject 8 Units into the skin daily. 15 mL 2  . Insulin Pen Needle 32G  X 8 MM MISC novotwist 32x30m  Pen needles Use once as directed daily 100 each 3  . lisinopril (PRINIVIL,ZESTRIL) 10 MG tablet Take 1 tablet (10 mg total) by mouth daily. 30 tablet 11  . nitroGLYCERIN (NITROSTAT) 0.4 MG SL tablet Place 1 tablet (0.4 mg total) under the tongue every 5 (five) minutes as needed for chest pain. 25 tablet 1  . VICTOZA 18 MG/3ML SOPN Inject 0.2 mLs (1.2 mg total) into the skin daily. 9 mL 2  . aspirin EC 81 MG EC tablet Take 1 tablet (81 mg total) by mouth daily. (Patient not taking: Reported on 05/08/2017)    . ondansetron (ZOFRAN) 4 MG tablet Take 1 tablet (4 mg total) by mouth every 8 (eight) hours as needed for nausea or vomiting. (Patient not taking: Reported on 05/02/2017) 12 tablet 0  . rosuvastatin (CRESTOR) 40 MG tablet Take 1 tablet (40 mg total) by mouth daily. (Patient not taking: Reported on 05/02/2017) 30 tablet 11   No current facility-administered medications on file prior to visit.     Review of Systems:  As  per HPI- otherwise negative.   Physical Examination: Vitals:   05/08/17 0853 05/08/17 0857  BP: (!) 147/90 (!) 145/84  Pulse: 91   Temp: 98.5 F (36.9 C)   SpO2: 98%    Vitals:   05/08/17 0853  Weight: 154 lb 12.8 oz (70.2 kg)  Height: _0  (1.626 m)   Body mass index is 26.57 kg/m. Ideal Body Weight: Weight in (lb) to have BMI = 25: 145.3  GEN: WDWN, NAD, Non-toxic, A & O x 3 HEENT: Atraumatic, Normocephalic. Neck supple. No masses, No LAD. Ears and Nose: No external deformity. CV: RRR, No M/G/R. No JVD. No thrill. No extra heart sounds. PULM: CTA B, no wheezes, crackles, rhonchi. No retractions. No resp. distress. No accessory muscle use. EXTR: No c/c/e NEURO Normal gait.  PSYCH: Normally interactive. Conversant. Not depressed or anxious appearing.  Calm demeanor.  Mild overweight, language barrier.  Looks well today  Assessment and Plan: Type 2 diabetes mellitus with diabetic neuropathy, with long-term current use of insulin ([place] - Plan: Hemoglobin A1c  Essential hypertension  Coronary artery disease involving native coronary artery of native heart with angina pectoris ([place]  Language barrier  Non compliance with medical treatment  Here today to follow-up from recent hospital stay for chest pain rule out.  unfortunately SJermonhas been in the ER or admitted a few times over the last 2 years.  It is difficult to control his disease due to language barrier/ lack of understanding of medication and disease state.  His children do come to his office visits but have not been very helpful in getting him to follow-up, and earlier this year actively convinced their father to not get his flu shot  Urged them to follow-up with me in one month, and to start back on his diabetes medications He is at high risk for CV disease and needs to be on a statin if at all possible.  They will try this medication again as they are not sure it actually caused a problem in the past See  patient instructions for more details.   Message to cardiology to please get in touch with him and schedule a follow-up  Signed JLamar Blinks MD

## 2017-05-05 NOTE — Telephone Encounter (Signed)
I took care of it!  No worries

## 2017-05-05 NOTE — Telephone Encounter (Signed)
Transition Care Management Follow-up Telephone Call  PCP: Copland, Gay Filler, MD  Admit date: 05/02/2017 Discharge date: 05/04/2017   How have you been since you were released from the hospital? Per the patient's son, "he's doing good".   Do you understand why you were in the hospital? yes   Do you understand the discharge instructions? yes   Where were you discharged to? Home   Items Reviewed:  Medications reviewed: patient's son stated he's his father caregiver & aware of the medications that he take, but do not have the list in front of him to review over the phone. He also voiced that his father had stopped taking a lot of his medication & restarted taking them again.  Allergies reviewed: yes, NKA  Dietary changes reviewed: per patient's son, his father eats a regular diet, however a low sodium/heart healthy diet is the recommendation.  Referrals reviewed: yes, follow-up with PCP & Cardiology   Functional Questionnaire:   Activities of Daily Living (ADLs):   He states they are independent in the following: ambulation, bathing and hygiene, feeding, continence, grooming, toileting and dressing States they require assistance with the following: None   Any transportation issues/concerns?: no   Any patient concerns? no   Confirmed importance and date/time of follow-up visits scheduled yes, 05/08/17 at 9:00 AM.  Provider Appointment booked with Dr. Lorelei Pont.  Confirmed with patient if condition begins to worsen call PCP or go to the ER.  Patient was given the office number and encouraged to call back with question or concerns.  : yes

## 2017-05-08 ENCOUNTER — Encounter: Payer: Self-pay | Admitting: Family Medicine

## 2017-05-08 ENCOUNTER — Ambulatory Visit (INDEPENDENT_AMBULATORY_CARE_PROVIDER_SITE_OTHER): Payer: Medicare Other | Admitting: Family Medicine

## 2017-05-08 VITALS — BP 145/84 | HR 91 | Temp 98.5°F | Ht 64.0 in | Wt 154.8 lb

## 2017-05-08 DIAGNOSIS — Z9119 Patient's noncompliance with other medical treatment and regimen: Secondary | ICD-10-CM | POA: Diagnosis not present

## 2017-05-08 DIAGNOSIS — E1140 Type 2 diabetes mellitus with diabetic neuropathy, unspecified: Secondary | ICD-10-CM | POA: Diagnosis not present

## 2017-05-08 DIAGNOSIS — Z794 Long term (current) use of insulin: Secondary | ICD-10-CM | POA: Diagnosis not present

## 2017-05-08 DIAGNOSIS — I25119 Atherosclerotic heart disease of native coronary artery with unspecified angina pectoris: Secondary | ICD-10-CM | POA: Diagnosis not present

## 2017-05-08 DIAGNOSIS — Z789 Other specified health status: Secondary | ICD-10-CM

## 2017-05-08 DIAGNOSIS — I10 Essential (primary) hypertension: Secondary | ICD-10-CM

## 2017-05-08 DIAGNOSIS — Z91199 Patient's noncompliance with other medical treatment and regimen due to unspecified reason: Secondary | ICD-10-CM

## 2017-05-08 DIAGNOSIS — I200 Unstable angina: Secondary | ICD-10-CM | POA: Diagnosis not present

## 2017-05-08 NOTE — Patient Instructions (Addendum)
Please pick up your diabetes medication from the pharmacy. I am going to check your A1c level (for average blood sugar) today and will be in touch with your results  Please see me in ONE MONTH for a recheck visit. You also need to follow-up with Richardson Dopp with cardiology- I will ask them to give you a call  Please try taking your crestor for cholesterol again.  If you are not able to take it please let me know Please bring in all your medications to your next visit [place] we can go over them

## 2017-05-09 ENCOUNTER — Other Ambulatory Visit: Payer: Self-pay

## 2017-05-09 NOTE — Patient Outreach (Signed)
[facility]) Care Management   05/09/2017  Darril Bazen [birth year] [id number]  Barry [name] is an 66 y.o. male Arrived for home visit at [place] provided [place] interpreter named Shawnmichael Parenteau ( from Language resources) for home visit.  All information gather from this home visit was translated.  Subjective: Patient reports that he developed blood in stools about 1 year ago and stopped taking all of his medications.  Had a visit to the hospital last week for chest pain and was admitted.  Patient reports to me that he does not like to take medications.  Reports that he is monitoring is CBG daily and recording. Reports that he is following his diet. Reports that he is taking his insulin and amlodipine only.  Patient lives with a friend and she provides transportation to MD appointment. Patient reports that he can no communicate through an IPAD that is used at medical appointments instead prefers face to Print production planner.   Objective:  Patient appears concerned with his health and wants to know what he is supposed to do. Reports that he does not understand his medications.  Home neat and clean.  Patient answers questions appropriately.  Vitals:   05/09/17 1126  BP: 132/72  Pulse: 92  Resp: 18  SpO2: 98%  Weight: 155 lb (70.3 kg)  Height: 1.651 m (5' 5" )    Review of Systems  Constitutional: Negative.   HENT:       Hard of hearing  Eyes:       Wearing glasses  Respiratory: Negative.   Cardiovascular:       Denies any chest pain since discharge from hospital  Gastrointestinal: Negative.   Genitourinary: Negative.   Musculoskeletal: Negative.   Skin:       Dry skin  Neurological: Negative.   Endo/Heme/Allergies: Negative.   Psychiatric/Behavioral: Negative.     Physical Exam  Constitutional: He is oriented to person, place, and time.  Cardiovascular: Normal rate, regular rhythm, normal heart sounds and intact distal pulses.   Respiratory: Effort normal and  breath sounds normal.  GI: Soft. Bowel sounds are normal.  Musculoskeletal: Normal range of motion. He exhibits no edema.  Neurological: He is alert and oriented to person, place, and time.  Skin: Skin is warm and dry.  Dry skin to feet.  Psychiatric: He has a normal mood and affect. His behavior is normal. Judgment and thought content normal.    Encounter Medications:   Outpatient Encounter Prescriptions as of 05/09/2017  Medication Sig  . amLODipine (NORVASC) 5 MG tablet Take 1 tablet (5 mg total) by mouth daily.  Marland Kitchen glucose monitoring kit (FREESTYLE) monitoring kit 1 each by Does not apply route 4 (four) times daily - after meals and at bedtime. 1 month Diabetic Testing Supplies for QAC-QHS accuchecks.  . Insulin Detemir (LEVEMIR FLEXTOUCH) 100 UNIT/ML Pen Inject 8 Units into the skin daily.  . Insulin Pen Needle 32G X 8 MM MISC novotwist 32x28m  Pen needles Use once as directed daily  . VICTOZA 18 MG/3ML SOPN Inject 0.2 mLs (1.2 mg total) into the skin daily.  .Marland Kitchenaspirin EC 81 MG EC tablet Take 1 tablet (81 mg total) by mouth daily. (Patient not taking: Reported on 05/08/2017)  . lisinopril (PRINIVIL,ZESTRIL) 10 MG tablet Take 1 tablet (10 mg total) by mouth daily. (Patient not taking: Reported on 05/09/2017)  . nitroGLYCERIN (NITROSTAT) 0.4 MG SL tablet Place 1 tablet (0.4 mg total) under the tongue every 5 (five) minutes as  needed for chest pain. (Patient not taking: Reported on 05/09/2017)  . ondansetron (ZOFRAN) 4 MG tablet Take 1 tablet (4 mg total) by mouth every 8 (eight) hours as needed for nausea or vomiting. (Patient not taking: Reported on 05/02/2017)  . rosuvastatin (CRESTOR) 40 MG tablet Take 1 tablet (40 mg total) by mouth daily. (Patient not taking: Reported on 05/02/2017)   No facility-administered encounter medications on file as of 05/09/2017.     Functional Status:   In your present state of health, do you have any difficulty performing the following activities: 05/09/2017  05/03/2017  Hearing? Y Y  Comment both ears "right ear; since the 1970s"  Vision? N N  Difficulty concentrating or making decisions? Y Y  Comment - "I do have some memory problems"  Walking or climbing stairs? Y Y  Comment - "climbing up stairs are hard for me; my knees give up; don't have enough strength to climb up"  Dressing or bathing? N N  Doing errands, shopping? (No Data) Y  Comment does not drive due to vision "I can''t drive right now"  Preparing Food and eating ? Y -  Comment signifcant other prepares food -  Using the Toilet? N -  In the past six months, have you accidently leaked urine? N -  Do you have problems with loss of bowel control? N -  Managing your Medications? Y -  Comment stopped taking medications due blood in stool. reports medications are expensive.   Has medicare -  Managing your Finances? Y -  Housekeeping or managing your Housekeeping? N -  Some recent data might be hidden    Fall/Depression Screening:    Fall Risk  05/09/2017 11/25/2016 10/18/2016  Falls in the past year? Yes No No  Number falls in past yr: 1 - -  Injury with Fall? No - -  Follow up Falls prevention discussed - -   PHQ 2/9 Scores 05/09/2017 11/25/2016 10/18/2016 10/11/2016 10/07/2015 04/03/2015  PHQ - 2 Score 0 0 0 0 0 0    Assessment:   (1) Reviewed [facility] case management program with patient. Reviewed consent on file and patient declines any needed changes.  Provided my card and [facility] magnet. (2) Not taking medications as prescribed due to reported lack of understanding. (3) unknown A1c.  States labs not drawn at MD office yesterday.  CBG range 140's.   Follows diet and is taking DM meds. (4)reports needing assistance applying for medicaid and  Transportation. (5) no advanced directives but is interested in completion.   Plan:  (1) reviewed [facility] consent in medical record. No changes. (2) [facility] pharmacy referral placed. Discussed case with [name].  Arranged home visit for 05/18/2017 at 10am  . Encouraged patient to continue to take his current medications.  (3) Will contact MD office and send this note to MD. (4)Referral to [place] worker Eduard Clos. Care Coordinated home visit for 8/23/2018at 11am.   Next home visit planned for 05/18/2017 as joint home visit with this care manager, [facility] social worker and [place].   Care planning and goal setting is to avoid a readmission.   Huge barrier for care is language barrier.  I called cardiology office and rescheduled appointment for patient on the day he will have transportation. Confirmed with office that they would provide a interpreter. Patient very happy to have an in home interpreter during this home visit.  Home visit planned for [facility] care manager, [facility] pharmacy and [facility] social worker for August  23rd . Will arrange translator.   THN CM Care Plan Problem One     Most Recent Value  Care Plan Problem One  Recent admission for chest pain  Role Documenting the Problem One  Care Management [place] for Problem One  Active  THN Long Term Goal   Patient will report no readmission in the next 60 days.   THN Long Term Goal Start Date  05/09/17  Interventions for Problem One Long Term Goal  [facility] home visit completed. Provided my contact information and reviewed [facility] program. Referrals place dto [facility] social worker and [place].   THN CM Short Term Goal #1   Paitent will monitor and record CBG daily for the necxt 30 days.   THN CM Short Term Goal #1 Start Date  05/09/17  Interventions for Short Term Goal #1  Reviewed importance of taking DM meds and monitoring CBG. Review hypo and hyper glycemia and home treatments. Reviewed when to call MD.  [facility] CM Short Term Goal #2   Patient will report weighing daily and recording for the next 30 days.   THN CM Short Term Goal #2 Start Date  05/09/17  Interventions for Short Term Goal #2  Reviewed benefits of daily weight. Reviewed importance of taking weight log to MD office. Placed  call to cardiology and rescheduled office visit when patient has available transportation.   THN CM Short Term Goal #3  Patient will report home visits by [facility] social worker and [facility] pharmay in the next 2 weeks.   THN CM Short Term Goal #3 Start Date  05/09/17  Interventions for Short Tern Goal #3  Arranged in home appointments with [facility] team members.      Barrier letter and this note sent to MD. [name], RN, BSN, CEN [place] Coordinator [phone]

## 2017-05-10 ENCOUNTER — Telehealth: Payer: Self-pay | Admitting: *Deleted

## 2017-05-10 NOTE — Telephone Encounter (Signed)
Received Physician Orders from [facility], forwarded to provider/SLS 08/15

## 2017-05-11 ENCOUNTER — Telehealth: Payer: Self-pay | Admitting: Pharmacist

## 2017-05-11 NOTE — Patient Outreach (Signed)
[name]) Care Management  05/11/2017  Barry [name] [birth year] [id number]   66 year old male referred to [place] Management by inpatient RNCM for diabetes management.  [name] services requested for medication review and medication adherence.  PMHx includes, but not limited to, CAD s/p PCI/DES x2 in 2016, carotid stenosis, CVA, HTN, HLD, type 2 diabetes, chronic dissection of mesenteric artery, PVD, bradycardia, diastolic CHF (EF= 53% 2/'99), chronic kidney disease, language barrier, and medication non-compliance.  Patient had recent hospitalization 05/02/2017 - 05/04/2017 for chest pain.   I spoke with [name] regarding patient's medications.  Patient stopped taking most / all of his medications about a year ago after he experienced bloody diarrhea that he believes was caused by his medications.  Patient does not speak English and requires an interpreter for telephonic and home visits.    Per Dr. Lillie Fragmin note on 05/08/2017, patient should be taking the following:    Cardiology: Aspirin 81mg , Amlodipine 5mg , Lisinopril 10mg , PRN NTG SL, Rosuvastatin (note not on a beta blocker in the past due to bradycardia)   Endocrinology: Levemir, liraglutide (note metformin stopped inpatient but may resume)   GI: PRN ondansetron  Plan: Home visit scheduled with patient, interpreter, and [name] RN and LCSW on May 18, 2017 at 10:00AM for medication review, education, and assistance with adherence.    [name], PharmD, [place] [phone]

## 2017-05-12 ENCOUNTER — Telehealth: Payer: Self-pay | Admitting: *Deleted

## 2017-05-12 NOTE — Telephone Encounter (Signed)
Received Physician Orders from [facility]; forwarded to provider/SLS 08/17  

## 2017-05-16 ENCOUNTER — Ambulatory Visit: Payer: Medicare Other | Admitting: Physician Assistant

## 2017-05-18 ENCOUNTER — Encounter: Payer: Self-pay | Admitting: *Deleted

## 2017-05-18 ENCOUNTER — Other Ambulatory Visit: Payer: Self-pay | Admitting: *Deleted

## 2017-05-18 ENCOUNTER — Telehealth: Payer: Self-pay | Admitting: *Deleted

## 2017-05-18 ENCOUNTER — Other Ambulatory Visit: Payer: Self-pay

## 2017-05-18 ENCOUNTER — Other Ambulatory Visit (INDEPENDENT_AMBULATORY_CARE_PROVIDER_SITE_OTHER): Payer: Medicare Other

## 2017-05-18 ENCOUNTER — Other Ambulatory Visit: Payer: Self-pay | Admitting: Pharmacist

## 2017-05-18 DIAGNOSIS — E118 Type 2 diabetes mellitus with unspecified complications: Secondary | ICD-10-CM | POA: Diagnosis not present

## 2017-05-18 LAB — HEMOGLOBIN A1C: Hgb A1c MFr Bld: 7.7 % — ABNORMAL HIGH (ref 4.6–6.5)

## 2017-05-18 NOTE — Patient Outreach (Signed)
Care Coordination: Placed call to MD office to get an appointment for pending labs. Spoke with office staff who has confirmed order and pending lab.    Scheduled labs for today at 2pm.  Girlfriend to drive patient to appointment.  Translator informed patient of appointment.  Patient voiced understanding.    [name], RN, BSN, CEN [facility] [employer] [phone]

## 2017-05-18 NOTE — Telephone Encounter (Signed)
Received call from front office that nurse from insurance company is on phone requesting that pt come in today to complete hgb a1c that was ordered at last visit on 05/08/17. States pt has interpreter with him today. Per 8/13 office note hgb a1c was supposed to be ordered but I do not see order in system. Order placed and pt will complete it today.

## 2017-05-18 NOTE — Patient Outreach (Signed)
[facility]) Care Management   05/18/2017  Preet Chain [birth year] [id number] Joint visit with [facility] social worker and [place]. This visit was transition of care. In person visit due to communication barrier.   Fordyce Lalley is an 66 y.o. male  Subjective:  Patient reports that he is doing well. Reports CBG within the last week of 85-108. Reports decreased appetite.  Sleeping well.  BP range of 131-153/73-87.   Denies any new problems. Denies any chest pain since last home visit last week. Reports that he is walking daily.   Objective:  Awake and alert.  Review of Systems  Constitutional: Positive for weight loss.  HENT: Negative.   Eyes: Negative.   Respiratory: Negative.   Cardiovascular: Negative.   Gastrointestinal: Negative.   Genitourinary: Negative.   Musculoskeletal: Negative.   Skin: Negative.   Neurological: Negative.   Endo/Heme/Allergies: Negative.   Psychiatric/Behavioral: Negative.     Physical Exam  Constitutional: He appears well-developed and well-nourished.    Encounter Medications:  Medications reviewed by [place].  Outpatient Encounter Prescriptions as of 05/18/2017  Medication Sig  . amLODipine (NORVASC) 5 MG tablet Take 1 tablet (5 mg total) by mouth daily.  Marland Kitchen aspirin EC 81 MG EC tablet Take 1 tablet (81 mg total) by mouth daily. (Patient not taking: Reported on 05/08/2017)  . glucose monitoring kit (FREESTYLE) monitoring kit 1 each by Does not apply route 4 (four) times daily - after meals and at bedtime. 1 month Diabetic Testing Supplies for QAC-QHS accuchecks.  . Insulin Detemir (LEVEMIR FLEXTOUCH) 100 UNIT/ML Pen Inject 8 Units into the skin daily.  . Insulin Pen Needle 32G X 8 MM MISC novotwist 32x41m  Pen needles Use once as directed daily  . lisinopril (PRINIVIL,ZESTRIL) 10 MG tablet Take 1 tablet (10 mg total) by mouth daily. (Patient not taking: Reported on 05/09/2017)  . nitroGLYCERIN (NITROSTAT) 0.4 MG SL tablet Place 1 tablet  (0.4 mg total) under the tongue every 5 (five) minutes as needed for chest pain. (Patient not taking: Reported on 05/09/2017)  . ondansetron (ZOFRAN) 4 MG tablet Take 1 tablet (4 mg total) by mouth every 8 (eight) hours as needed for nausea or vomiting. (Patient not taking: Reported on 05/02/2017)  . rosuvastatin (CRESTOR) 40 MG tablet Take 1 tablet (40 mg total) by mouth daily. (Patient not taking: Reported on 05/02/2017)  . VICTOZA 18 MG/3ML SOPN Inject 0.2 mLs (1.2 mg total) into the skin daily.   No facility-administered encounter medications on file as of 05/18/2017.     Functional Status:   In your present state of health, do you have any difficulty performing the following activities: 05/15/2017 05/09/2017  Hearing? - Y  Comment - both ears  Vision? - N  Difficulty concentrating or making decisions? - Y  Comment - -  Walking or climbing stairs? - Y  Comment - -  Dressing or bathing? - N  Doing errands, shopping? Y (No Data)  Comment - does not drive due to vision  Preparing Food and eating ? - Y  Comment - signifcant other prepares food  Using the Toilet? - N  In the past six months, have you accidently leaked urine? - N  Do you have problems with loss of bowel control? - N  Managing your Medications? - Y  Comment - stopped taking medications due blood in stool. reports medications are expensive.   Has medicare  Managing your Finances? - Y  Housekeeping or managing your Housekeeping? - N  Some recent data might be hidden    Fall/Depression Screening:    Fall Risk  05/09/2017 11/25/2016 10/18/2016  Falls in the past year? Yes No No  Number falls in past yr: 1 - -  Injury with Fall? No - -  Follow up Falls prevention discussed - -   PHQ 2/9 Scores 05/09/2017 11/25/2016 10/18/2016 10/11/2016 10/07/2015 04/03/2015  PHQ - 2 Score 0 0 0 0 0 0    Assessment:    (1) Reports doing well today.  (2) CBG today of 85.    Plan:  (1) Will continue to outreach weekly. (2) encouraged patient to  continue monitor and record CBG.  Provided [facility] calendar and reviewed where to record.   THN CM Care Plan Problem One     Most Recent Value  Care Plan Problem One  Recent admission for chest pain  Role Documenting the Problem One  Care Management [place] for Problem One  Active  THN Long Term Goal   Patient will report no readmission in the next 60 days.   THN Long Term Goal Start Date  05/09/17  Interventions for Problem One Long Term Goal  home visit for transiton of  care due to language barrier.  Encouraged patient to continue to monitor BP and CBG daily.  THN CM Short Term Goal #1   Paitent will monitor and record CBG daily for the necxt 30 days.   THN CM Short Term Goal #1 Start Date  05/09/17  Interventions for Short Term Goal #1  Reviewed CBG log and encouraged daily monitoring and recording of CBG.   THN CM Short Term Goal #2   Patient will report weighing daily and recording for the next 30 days.   THN CM Short Term Goal #2 Start Date  05/09/17  Interventions for Short Term Goal #2  Reviewed weight log.    THN CM Short Term Goal #3  Patient will report home visits by [facility] social worker and [facility] pharmay in the next 2 weeks.   THN CM Short Term Goal #3 Start Date  05/09/17  [facility] CM Short Term Goal #3 Met Date  05/18/17  Interventions for Short Tern Goal #3  Arranged in home appointments with [facility] team members.      [name], RN, BSN, CEN [facility] [employer] [phone]

## 2017-05-18 NOTE — Patient Outreach (Signed)
[place] [facility]) Care Management  [facility] Social Work  05/18/2017  Bernard Lich 1950/12/13 [id number]  Subjective:  "I need Medicaid, dental work too".  Objective: CSW to assist patient with commuity based resources to aide in his well-being, quality of life and overall safety/needs.    Encounter Medications:  Outpatient Encounter Prescriptions as of 05/18/2017  Medication Sig  . amLODipine (NORVASC) 5 MG tablet Take 1 tablet (5 mg total) by mouth daily.  Marland Kitchen aspirin EC 81 MG EC tablet Take 1 tablet (81 mg total) by mouth daily.  Marland Kitchen glucose monitoring kit (FREESTYLE) monitoring kit 1 each by Does not apply route 4 (four) times daily - after meals and at bedtime. 1 month Diabetic Testing Supplies for QAC-QHS accuchecks.  . Insulin Detemir (LEVEMIR FLEXTOUCH) 100 UNIT/ML Pen Inject 8 Units into the skin daily.  . Insulin Pen Needle 32G X 8 MM MISC novotwist 32x54m  Pen needles Use once as directed daily  . lisinopril (PRINIVIL,ZESTRIL) 10 MG tablet Take 1 tablet (10 mg total) by mouth daily. (Patient not taking: Reported on 05/09/2017)  . nitroGLYCERIN (NITROSTAT) 0.4 MG SL tablet Place 1 tablet (0.4 mg total) under the tongue every 5 (five) minutes as needed for chest pain.  .Marland Kitchenondansetron (ZOFRAN) 4 MG tablet Take 1 tablet (4 mg total) by mouth every 8 (eight) hours as needed for nausea or vomiting. (Patient not taking: Reported on 05/02/2017)  . rosuvastatin (CRESTOR) 40 MG tablet Take 1 tablet (40 mg total) by mouth daily. (Patient not taking: Reported on 05/02/2017)  . VICTOZA 18 MG/3ML SOPN Inject 0.2 mLs (1.2 mg total) into the skin daily.   No facility-administered encounter medications on file as of 05/18/2017.     Functional Status:  In your present state of health, do you have any difficulty performing the following activities: 05/15/2017 05/09/2017  Hearing? - Y  Comment - both ears  Vision? - N  Difficulty concentrating or making decisions? - Y  Comment - -  Walking or  climbing stairs? - Y  Comment - -  Dressing or bathing? - N  Doing errands, shopping? Y (No Data)  Comment - does not drive due to vision  Preparing Food and eating ? - Y  Comment - signifcant other prepares food  Using the Toilet? - N  In the past six months, have you accidently leaked urine? - N  Do you have problems with loss of bowel control? - N  Managing your Medications? - Y  Comment - stopped taking medications due blood in stool. reports medications are expensive.   Has medicare  Managing your Finances? - Y  Housekeeping or managing your Housekeeping? - N  Some recent data might be hidden    Fall/Depression Screening:  PHQ 2/9 Scores 05/18/2017 05/09/2017 11/25/2016 10/18/2016 10/11/2016 10/07/2015 04/03/2015  PHQ - 2 Score 1 0 0 0 0 0 0    Assessment:  CSW met with patient, his girlfriend, Interpreter and [place] today at patient's home to complete assessment. Patient reports he had a stroke and is on disability. He receives Medicare but does not think he gets Medicaid. He does report getting food stamps.  CSW completed assessment and will work to assist with Advance Directive completion, Medicaid and SCAT applications, and possible dental and vision support/services.    Plan:  [facility] Problem One     Most Recent Value  Care Plan Problem One  Patient with limited income and resources.  Role Documenting  the Problem One  Clinical Social Worker  Care Plan for Problem One  Active  THN Long Term Goal      Interventions for Problem One Long Term Goal     THN CM Short Term Goal #1   Paitent will apply for Medicaid and SCAT in the next 30 days.  THN CM Short Term Goal #1 Start Date  05/09/17  Interventions for Short Term Goal #1   CSW discussed SCAT and Medicaid and will provide and assist with applications .   THN CM Short Term Goal #2   Patient will complete AD=dvance Directive in the next 30 days.  THN CM Short Term Goal #2 Start Date  05/09/17  Interventions for  Short Term Goal #2  CSW educated and provided packet for completion .   THN CM Short Term Goal #3     Interventions for Short Tern Goal #3              Eduard Clos, MSW, [place] Worker  [place] [phone]

## 2017-05-18 NOTE — Patient Outreach (Signed)
[facility]) Care Management  [place]   05/18/2017  Buster Schueller 1951-07-30 [id number]  66 year old male referred to [place] [facility] by inpatient RNCM for diabetes management.  [place] services requested for medication review and medication adherence.  PMHx includes, but not limited to, CAD s/p PCI/DES x2 in 2016, carotid stenosis, CVA, HTN, HLD, type 2 diabetes, chronic dissection of mesenteric artery, PVD, bradycardia, diastolic CHF (EF= 67% 6/'19), chronic kidney disease, language barrier, and medication non-compliance.  Patient had recent hospitalization 05/02/2017 - 05/04/2017 for chest pain.   Patient reports he stopped taking most / all of his medications about a year ago after he experienced bloody diarrhea that he believes was caused by his medications.  Patient does not speak English and requires an interpreter for telephonic and home visits.    Per Dr. Lillie Fragmin note on 05/08/2017, patient should be taking the following:    Cardiology: Aspirin 70m, Amlodipine 559m Lisinopril 1062mPRN NTG SL, Rosuvastatin (note not on a beta blocker in the past due to bradycardia)   Endocrinology: Levemir, liraglutide (note metformin stopped inpatient but may resume)   GI: PRN ondansetron  Successful home visit today with Mr. Yinger and his wife, a traOptometristHNRedondo Beachnd THNB[facility], IncSW JanEduard Clos Patient's Medicare Part D plan:  [place] Rx Saver [phone] Full Extra Help  Subjective:  Patient has large bag of expired medication bottles from 2016-2017.  He has his current medications out for me to review.   Patient reports he checks blood pressure and blood sugar daily and logs his readings.    Patient reports [facility].    Patient reports he has occasional leg pain and stomach pain for which he is taking PRN gabapentin and ranitidine which are both expired and not on his active medication  list.    Objective:  Blood pressure: 122/68  Encounter Medications: Outpatient Encounter Prescriptions as of 05/18/2017  Medication Sig  . amLODipine (NORVASC) 5 MG tablet Take 1 tablet (5 mg total) by mouth daily.  . aMarland Kitchenpirin EC 81 MG EC tablet Take 1 tablet (81 mg total) by mouth daily. (Patient not taking: Reported on 05/08/2017)  . glucose monitoring kit (FREESTYLE) monitoring kit 1 each by Does not apply route 4 (four) times daily - after meals and at bedtime. 1 month Diabetic Testing Supplies for QAC-QHS accuchecks.  . Insulin Detemir (LEVEMIR FLEXTOUCH) 100 UNIT/ML Pen Inject 8 Units into the skin daily.  . Insulin Pen Needle 32G X 8 MM MISC novotwist 32x5mm63men needles Use once as directed daily  . lisinopril (PRINIVIL,ZESTRIL) 10 MG tablet Take 1 tablet (10 mg total) by mouth daily. (Patient not taking: Reported on 05/09/2017)  . nitroGLYCERIN (NITROSTAT) 0.4 MG SL tablet Place 1 tablet (0.4 mg total) under the tongue every 5 (five) minutes as needed for chest pain. (Patient not taking: Reported on 05/09/2017)  . ondansetron (ZOFRAN) 4 MG tablet Take 1 tablet (4 mg total) by mouth every 8 (eight) hours as needed for nausea or vomiting. (Patient not taking: Reported on 05/02/2017)  . rosuvastatin (CRESTOR) 40 MG tablet Take 1 tablet (40 mg total) by mouth daily. (Patient not taking: Reported on 05/02/2017)  . VICTOZA 18 MG/3ML SOPN Inject 0.2 mLs (1.2 mg total) into the skin daily.   No facility-administered encounter medications on file as of 05/18/2017.     Functional Status: In your present state  of health, do you have any difficulty performing the following activities: 05/15/2017 05/09/2017  Hearing? - Y  Comment - both ears  Vision? - N  Difficulty concentrating or making decisions? - Y  Comment - -  Walking or climbing stairs? - Y  Comment - -  Dressing or bathing? - N  Doing errands, shopping? Y (No Data)  Comment - does not drive due to vision  Preparing Food and eating ? - Y   Comment - signifcant other prepares food  Using the Toilet? - N  In the past six months, have you accidently leaked urine? - N  Do you have problems with loss of bowel control? - N  Managing your Medications? - Y  Comment - stopped taking medications due blood in stool. reports medications are expensive.   Has medicare  Managing your Finances? - Y  Housekeeping or managing your Housekeeping? - N  Some recent data might be hidden    Fall/Depression Screening: Fall Risk  05/09/2017 11/25/2016 10/18/2016  Falls in the past year? Yes No No  Number falls in past yr: 1 - -  Injury with Fall? No - -  Follow up Falls prevention discussed - -   PHQ 2/9 Scores 05/09/2017 11/25/2016 10/18/2016 10/11/2016 10/07/2015 04/03/2015  PHQ - 2 Score 0 0 0 0 0 0    Assessment:  Drugs sorted by system:  Cardiovascular: Amlodipine, aspirin 75m EC, lisinopril, PRN nitroglycerin, rosuvastatin  Endocrine: Levemir, Victoza  Other issues noted:  Medication adherence: 1.  Patient is not taking lisinopril or rosuvastatin although he has medication bottles on hand.  I counseled patient to take these as recommended by Dr. CEdilia Bo  Patient states he will start taking these medications as directed.  I called in refill for PRN NTG for patient.    2.  Patient does not have ondansetron tablets.    3.  Patient taking PRN gabapentin and ranitidine although these are not active medications.    4.  Patient checking blood sugar once daily however directions state QID.   No Hemoglobin A1c in the last year.    5.  No refills on amlodipine - will need to request from Dr. CEdilia Bo    Language barrier: [facility] and [facility] do not have ability to print instructions in [place]  Translator present today reviewed medication instructions with patient and patient confirmed understanding of taking medications once daily.    Plan: 1.  I scheduled another home visit with interpretor, THN LCSW, and [facility] for next Thursday,  August 30, at noon to assess compliance.  2.  I will route note to provider regarding medication issues noted above. 3.  I removed several bottles of expired [facility]    CRalene Bathe PharmD, [phone]

## 2017-05-19 ENCOUNTER — Encounter: Payer: Self-pay | Admitting: Family Medicine

## 2017-05-24 ENCOUNTER — Ambulatory Visit: Payer: Medicare Other

## 2017-05-25 ENCOUNTER — Other Ambulatory Visit: Payer: Self-pay

## 2017-05-25 ENCOUNTER — Other Ambulatory Visit: Payer: Self-pay | Admitting: *Deleted

## 2017-05-25 ENCOUNTER — Other Ambulatory Visit: Payer: Self-pay | Admitting: Pharmacist

## 2017-05-25 ENCOUNTER — Other Ambulatory Visit: Payer: Self-pay | Admitting: Family Medicine

## 2017-05-25 ENCOUNTER — Telehealth: Payer: Self-pay | Admitting: Family Medicine

## 2017-05-25 MED ORDER — AMLODIPINE BESYLATE 5 MG PO TABS: 5.0000 mg | ORAL_TABLET | Freq: Every day | ORAL | 3 refills | Status: DC

## 2017-05-25 MED ORDER — AMLODIPINE BESYLATE 5 MG PO TABS: 5.0000 mg | ORAL_TABLET | Freq: Every day | ORAL | 0 refills | Status: DC

## 2017-05-25 NOTE — Telephone Encounter (Signed)
Caller name: Jaclyn Shaggy  Relation to pt: [place]   Call back number: [phone]  Pharmacy: [facility] ([facility] [place]  Reason for call:   Calling on patient behalf due to language barrier requesting 90 day supply of amLODipine (NORVASC) 5 MG tablet

## 2017-05-25 NOTE — Patient Outreach (Signed)
[facility]) Care Management  05/25/2017  Macario Ke 03-25-1951 [id number]  Successful outreach visit with Mr. Boen today at his home with [facility] RN [name], [facility] LCSW Eduard Clos, and interpreter.    Subjective:  Mr. Berent reports feeling tired today but no further stomach pain.  He reports no side effects from his medications and states he is taking all of medications as scheduled.  Mr. Sydney has been keeping track of his daily blood pressure, pulse, blood sugar, and weight in the [place].  Mr. Steichen reports he is no longer taking gabapentin or ranitidine.    Per review of Mr. Kargbo daily log 05/25/2017: Blood pressure: 111/69 Heart rate: 69 Fasting blood sugar: 106 Weight: 145lb  Objective:  Lab work completed 8/23:  Hemoglobin A1c = 7/7  Medications Reviewed Today    Reviewed by Rudean Haskell, RPH (Pharmacist) on 05/18/17 at 1114  Med List Status: <None>  Medication Order Taking? Sig Documenting Provider Last Dose Status Informant  amLODipine (NORVASC) 5 MG tablet [id number] Yes Take 1 tablet (5 mg total) by mouth daily. Geradine Girt, DO Taking Active   aspirin EC 81 MG EC tablet [id number] Yes Take 1 tablet (81 mg total) by mouth daily. Geradine Girt, DO Taking Active   glucose monitoring kit (FREESTYLE) monitoring kit [id number] Yes 1 each by Does not apply route 4 (four) times daily - after meals and at bedtime. 1 month Diabetic Testing Supplies for QAC-QHS accuchecks. Jonetta Osgood, MD Taking Active Family Member  Insulin Detemir (LEVEMIR FLEXTOUCH) 100 UNIT/ML Pen [id number] Yes Inject 8 Units into the skin daily. Copland, Gay Filler, MD Taking Active   Insulin Pen Needle 32G X 8 MM MISC [id number] Yes novotwist 32x51m  Pen needles Use once as directed daily Copland, JGay Filler MD Taking Active Family Member  lisinopril (PRINIVIL,ZESTRIL) 10 MG tablet [id number] Take 1 tablet (10 mg total) by mouth daily.  Patient not taking:  Reported on 05/09/2017   WLiliane Shi PA-C Not Taking Active Family Member  nitroGLYCERIN (NITROSTAT) 0.4 MG SL tablet [id number] Place 1 tablet (0.4 mg total) under the tongue every 5 (five) minutes as needed for chest pain. GWendie Agreste MD Taking Active Family Member  ondansetron ([facility] 4 MG tablet [id number] Take 1 tablet (4 mg total) by mouth every 8 (eight) hours as needed for nausea or vomiting.  Patient not taking:  Reported on 05/02/2017   Tegeler, CGwenyth Allegra MD Not Taking Active Family Member  rosuvastatin (CRESTOR) 40 MG tablet [id number] Take 1 tablet (40 mg total) by mouth daily.  Patient not taking:  Reported on 05/02/2017   WLiliane Shi PA-C Not Taking Active Family Member  VICTOZA 18 MG/3ML SOPN [id number] Inject 0.2 mLs (1.2 mg total) into the skin daily. Copland, JGay Filler MD Taking Active           Assessment: Per discussion with Mr. Zemaitis, he has been compliant with all of his current medications.  He is running low on his oral medications and will require refills in the next 1-2 weeks.  He still has several months left of his insulin, liraglutide, and diabetic testing supplies.   I called [facility] pharmacy and requested refills for patient.  I also called Dr. CLillie Fragminoffice to ensure they received a refill request on Amlodipine as this prescription did not have further refills on file.  JMartiniqueis the oGlass blower/designerand is aware of THN involvement.  Dr. Lillie Fragmin office states they received the request and will send in a new amlodipine prescription.  They clarified that the ondansetron prescription on patient's medication list was a 1x fill from an ED visit several months prior therefore I removed this medication from patient's list.    I counseled patient to take in his bottles to the pharmacy when he needs refills [place] there is no confusion due to language barrier.  Patient voiced understanding.    Plan: I will follow-up with Mr. Cwynar after his office visit with Dr. Lorelei Pont to see if  there are any medication changes and ensure patient continues to take his medications as prescribed.    [name], PharmD, [place] [phone]

## 2017-05-25 NOTE — Patient Outreach (Signed)
Barry [name]   05/25/2017  Barry [name] 06-13-1951 [id number]  Barry [name] is an 66 y.o. male Translator Barry [name] present during home visit Subjective:  Patient reports that he is doing well. No chest pain and no shortness of breath. Reports that he feels tired at times.  CBG range in the last week are 92-118;  BP range of 107-141/ 69-89. Weight range 140-145,  HR range 63-80.  Objective:  Awake and alert.  Vitals:   05/25/17 1238  BP: 130/72  Pulse: 72  Resp: 18  SpO2: 98%  Weight: 145 lb (65.8 kg)   ROS  Physical Exam  Encounter Medications:   Outpatient Encounter Prescriptions as of 05/25/2017  Medication Sig  . amLODipine (NORVASC) 5 MG tablet Take 1 tablet (5 mg total) by mouth daily.  Marland Kitchen aspirin EC 81 MG EC tablet Take 1 tablet (81 mg total) by mouth daily.  Marland Kitchen glucose monitoring kit (FREESTYLE) monitoring kit 1 each by Does not apply route 4 (four) times daily - after meals and at bedtime. 1 month Diabetic Testing Supplies for QAC-QHS accuchecks.  . Insulin Detemir (LEVEMIR FLEXTOUCH) 100 UNIT/ML Pen Inject 8 Units into the skin daily.  . Insulin Pen Needle 32G X 8 MM MISC novotwist 32x71m  Pen needles Use once as directed daily  . lisinopril (PRINIVIL,ZESTRIL) 10 MG tablet Take 1 tablet (10 mg total) by mouth daily.  . nitroGLYCERIN (NITROSTAT) 0.4 MG SL tablet Place 1 tablet (0.4 mg total) under the tongue every 5 (five) minutes as needed for chest pain.  .Marland Kitchenondansetron (ZOFRAN) 4 MG tablet Take 1 tablet (4 mg total) by mouth every 8 (eight) hours as needed for nausea or vomiting. (Patient not taking: Reported on 05/02/2017)  . rosuvastatin (CRESTOR) 40 MG tablet Take 1 tablet (40 mg total) by mouth daily.  .Marland KitchenVICTOZA 18 MG/3ML SOPN Inject 0.2 mLs (1.2 mg total) into the skin daily.   No facility-administered encounter medications on file as of 05/25/2017.     Functional Status:   In your present state of health, do you have any difficulty  performing the following activities: 05/15/2017 05/09/2017  Hearing? - Y  Comment - both ears  Vision? - N  Difficulty concentrating or making decisions? - Y  Comment - -  Walking or climbing stairs? - Y  Comment - -  Dressing or bathing? - N  Doing errands, shopping? Y (No Data)  Comment - does not drive due to vision  Preparing Food and eating ? - Y  Comment - signifcant other prepares food  Using the Toilet? - N  In the past six months, have you accidently leaked urine? - N  Do you have problems with loss of bowel control? - N  Managing your Medications? - Y  Comment - stopped taking medications due blood in stool. reports medications are expensive.   Has medicare  Managing your Finances? - Y  Housekeeping or managing your Housekeeping? - N  Some recent data might be hidden    Fall/Depression Screening:    Fall Risk  05/18/2017 05/09/2017 11/25/2016  Falls in the past year? Yes Yes No  Number falls in past yr: 1 1 -  Injury with Fall? No No -  Follow up - Falls prevention discussed -   PHQ 2/9 Scores 05/18/2017 05/09/2017 11/25/2016 10/18/2016 10/11/2016 10/07/2015 04/03/2015  PHQ - 2 Score 1 0 0 0 0 0 0    Assessment:   (1) No new problems. (2)  feeling better. (3) has follow up planned.  (4) keeping log of BP, CBG, weights and HR. (5)medications reviewed by [facility] pharmacy during home visit.  Plan:  (1) will continue weekly outreaches. (2) encouraged patient to continue to be active. (3) encouraged follow up with MD as planned. (4) Encouraged patient to continue keeping logs. (5) advised about meds by pharmacy.   THN CM Care Plan Problem One     Most Recent Value  Care Plan Problem One  Recent admission for chest pain  Role Documenting the Problem One  Care [name] for Problem One  Active  THN Long Term Goal   Patient will report no readmission in the next 60 days.   THN Long Term Goal Start Date  05/09/17  Interventions for Problem One Long Term  Goal  another home visit for transition of care.  No new concerns.   THN CM Short Term Goal #1   Paitent will monitor and record CBG daily for the necxt 30 days.   THN CM Short Term Goal #1 Start Date  05/09/17  Interventions for Short Term Goal #1  Encouraged patient to continue to monitor daily.  THN CM Short Term Goal #2   Patient will report weighing daily and recording for the next 30 days.   THN CM Short Term Goal #2 Start Date  05/09/17  Interventions for Short Term Goal #2  encouraged patient to continue to weigh daily.  THN CM Short Term Goal #3  Patient will report home visits by [facility] social worker and [facility] pharmay in the next 2 weeks.   THN CM Short Term Goal #3 Start Date  05/09/17  [facility] CM Short Term Goal #3 Met Date  05/18/17  Interventions for Short Tern Goal #3  Arranged in home appointments with [facility] team members.      [name], RN, BSN, CEN [facility] [employer] [phone]

## 2017-05-27 NOTE — Patient Outreach (Signed)
[facility] [facility]) Care Management  [facility] Social Work  05/27/2017  Doil Printy 1950/12/26 [id number]  Subjective:  Patient reports doing well- appreciative of [facility] support.   Objective: CSW to assist patient with commuity based resources to aide in his well-being, quality of life and overall safety/needs.    Encounter Medications:  Outpatient Encounter Prescriptions as of 05/25/2017  Medication Sig  . amLODipine (NORVASC) 5 MG tablet Take 1 tablet (5 mg total) by mouth daily.  Marland Kitchen aspirin EC 81 MG EC tablet Take 1 tablet (81 mg total) by mouth daily.  Marland Kitchen glucose monitoring kit (FREESTYLE) monitoring kit 1 each by Does not apply route 4 (four) times daily - after meals and at bedtime. 1 month Diabetic Testing Supplies for QAC-QHS accuchecks.  . Insulin Detemir (LEVEMIR FLEXTOUCH) 100 UNIT/ML Pen Inject 8 Units into the skin daily.  . Insulin Pen Needle 32G X 8 MM MISC novotwist 32x62m  Pen needles Use once as directed daily  . lisinopril (PRINIVIL,ZESTRIL) 10 MG tablet Take 1 tablet (10 mg total) by mouth daily.  . nitroGLYCERIN (NITROSTAT) 0.4 MG SL tablet Place 1 tablet (0.4 mg total) under the tongue every 5 (five) minutes as needed for chest pain.  .Marland Kitchenondansetron (ZOFRAN) 4 MG tablet Take 1 tablet (4 mg total) by mouth every 8 (eight) hours as needed for nausea or vomiting. (Patient not taking: Reported on 05/02/2017)  . rosuvastatin (CRESTOR) 40 MG tablet Take 1 tablet (40 mg total) by mouth daily.  .Marland KitchenVICTOZA 18 MG/3ML SOPN Inject 0.2 mLs (1.2 mg total) into the skin daily.   No facility-administered encounter medications on file as of 05/25/2017.     Functional Status:  In your present state of health, do you have any difficulty performing the following activities: 05/15/2017 05/09/2017  Hearing? - Y  Comment - both ears  Vision? - N  Difficulty concentrating or making decisions? - Y  Comment - -  Walking or climbing stairs? - Y  Comment - -  Dressing or bathing? - N  Doing  errands, shopping? Y (No Data)  Comment - does not drive due to vision  Preparing Food and eating ? - Y  Comment - signifcant other prepares food  Using the Toilet? - N  In the past six months, have you accidently leaked urine? - N  Do you have problems with loss of bowel control? - N  Managing your Medications? - Y  Comment - stopped taking medications due blood in stool. reports medications are expensive.   Has medicare  Managing your Finances? - Y  Housekeeping or managing your Housekeeping? - N  Some recent data might be hidden    Fall/Depression Screening:  PHQ 2/9 Scores 05/18/2017 05/09/2017 11/25/2016 10/18/2016 10/11/2016 10/07/2015 04/03/2015  PHQ - 2 Score 1 0 0 0 0 0 0    Assessment:  CSW met with patient in his home along with Interpreter, THN RNCM and THN RPH.  CSW assisted with paperwork to apply for Medicaid per patient request. CSW offered to assist with [place] application but patient denies need for transportation assistance;  Stating his family can provide rides to all appointments- either his girlfriend, son or grandson.   CSW will complete and file application for Medicaid and plan f/u visit to update and provide other resources at [facility] of [facility] with Interpreter.    [facility] HospitalCM Care Plan Problem One     Most Recent Value  Care Plan Problem One  Patient with limited  income and resources.  Role Documenting the Problem One  Clinical Social Worker  Care Plan for Problem One  Active  THN Long Term Goal      Interventions for Problem One Long Term Goal     THN CM Short Term Goal #1   Paitent will apply for Medicaid and SCAT in the next 30 days.  THN CM Short Term Goal #1 Start Date  05/09/17  Interventions for Short Term Goal #1   CSW discussed SCAT and Medicaid and will provide and assist with applications .   THN CM Short Term Goal #2   Patient will complete AD=dvance Directive in the next 30 days.  THN CM Short Term Goal #2 Start Date  05/09/17  Interventions for Short  Term Goal #2  CSW educated and provided packet for completion .   THN CM Short Term Goal #3     Interventions for Short Tern Goal #3          Plan:  File Medicaid application and plan home visit for follow up and to provide and review resources.   Eduard Clos, MSW, [place] Worker  [place] [phone]

## 2017-05-31 ENCOUNTER — Ambulatory Visit: Payer: Medicare Other | Admitting: Physician Assistant

## 2017-06-02 ENCOUNTER — Ambulatory Visit: Payer: Medicare Other | Admitting: Physician Assistant

## 2017-06-02 ENCOUNTER — Other Ambulatory Visit: Payer: Self-pay

## 2017-06-02 NOTE — Patient Outreach (Signed)
Transition of care: Patient cancelled his cardiology appointment for today [place] therefore I could not speak with patient while he was with translator. Appointment was rescheduled for October 1st per son.  Placed call to son who is on consent who states patient is doing well. No new problems to report.  PLAN: Will plan next follow up in 30 days.  [name], RN, BSN, CEN [facility] [employer] [phone]

## 2017-06-04 NOTE — Progress Notes (Addendum)
[place] at [facility] [place], [place], [place] [address] [phone] [id number]  Date:  06/07/2017   Name:  Barry [name]   DOB:  12/10/1950   MRN:  [id number]  PCP:  Darreld Mclean, MD    Chief Complaint: Follow-up (Pt here for 1 month f/u. declined flu vaccine. )   History of Present Illness:  Barry [name] is a 66 y.o. very pleasant male patient who presents with the following:  History of uncontrolled DM, CHF, TIA/ CVA, HTN, and language barrier Here today to recheck his DM He was last here about one month ago:  It does not look like he had an A1c during admission, [place] will also check this today.  He was not taking any of his home medications when he was admitted per note  I am not clear on why he was to stop metformin- will send message to inpt team  -addnd- heard back from Dr. Eliseo Squires from intp service.  They did not restart his metformin, but he is able to go back on this medication  He needs an eye exam, foot exam as well  His son interprets for him today He states that he is feeling a little bit tired still, but does not have any further CP or SOB I refilled his victoza and levemir but they have not picked them up yet.  I received an urgent message to refill this med on Friday afternoon which I did do for them.   He is not taking crestor - they are not sure but think that this medication may have caused bloody diarrhea in the past.  However they are really not sure if this was the case or not. It may have been another medication "that you put under your tongue" (?nitro).  They did not being medications with them today [place] we are not sure  Lab Results  Component Value Date   HGBA1C 7.7 (H) 05/18/2017   He has been checking his glucose at home, afternoon sugars 140 or less.  He will sometimes have glucose of under 100, on those days he will not take his insulin   His son notes that he seems to wake up frequently during the  night.  He may need to urinate, but not always- sometimes he just gets up and walks around the house, cannot get back to sleep He has noted difficulty with sleep for about one month now Notes that he is feeling very tired  He is using victoza, and 8 units of levemir currently for his DM  He is on crestor for his cholesterol He has noted that he feels tired and like his arms and legs are not strong- however he is not having any muscle aches or pains   He needs an eye exam, prevnar, flu shot, foot exam  Pt notes that he is losing weight, however per our scale his weight is up However he is not having any SOB or leg swelling He does have a history of diastolic CHF per echo done about a year ago  Impressions:  - Normal LV size with moderate LV hypertrophy. EF 55%.   Inferolateral hypokinesis. Normal RV size and systolic function.   No significant valvular abnormalities.  Wt Readings from Last 3 Encounters:  06/07/17 157 lb 12.8 oz (71.6 kg)  05/25/17 145 lb (65.8 kg)  05/18/17 145 lb (65.8 kg)      Patient Active Problem List  Diagnosis Date Noted  . Chest pain, rule out acute myocardial infarction 05/03/2017  . Atherosclerotic peripheral vascular disease with ulceration ([place]) 04/03/2015  . Special screening for malignant neoplasms, colon 05/07/2014  . Coronary artery disease involving native coronary artery of native heart without angina pectoris 03/28/2014  . Aneurysm of iliac artery ([place]) 03/18/2014  . Abdominal pain, unspecified site 03/18/2014  . Syncope in setting of nausea and vomiting 03/07/14 03/08/2014  . Non compliance with medical treatment 02/28/2014  . Dyslipidemia, goal LDL below 70 02/28/2014  . Bradycardia- beta blocker decreased 02/28/2014  . Chronic diastolic CHF (congestive heart failure) ([place]) 02/28/2014  . Type 2 diabetes mellitus with manifestations ([place]) 02/27/2014  . TIA (transient ischemic attack) 02/26/2014  . History of non-ST elevation myocardial  infarction (NSTEMI) 02/26/2014  . Pain in limb-Abdomen  12/06/2013  . Weakness-Left arm / Lef 12/06/2013  . Mesenteric artery stenosis ([place]) 12/06/2013  . 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[place]) 12/03/2012  . Hypertension 11/10/2011  . History of CVA in adulthood 11/10/2011  . Language barrier 11/10/2011    Past Medical History:  Diagnosis Date  . CAD (coronary artery disease)    a. LHC (02/26/14):  mLAD 20 (faint L>R collats), mCFX 50, pRCA 95 (plaque rupture) >> PCI:  Xience DES to pRCA (severe tortuosity of R innominate artery >> needs L radial or FA in future);  b. LHC (03/10/14):  CFX 90, oOM 70, pRCA stent patent >> PCI:  Xience DES to mCFX   . Carotid stenosis    a. Carotid US (02/27/14):  Bilateral ICA 1-39%  . Depression   . Diabetes mellitus   . 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[place])    a. Mesenteric Artery Duplex (7/15):  pSMA chronic dissection with aneurysmal dilation of 1.29 cm (VVS);  b.  Chest CTA (02/26/14):  IMPRESSION:  1. No aortic dissection or other acute abnormality.  2. 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  3. Stable 17 mm ectasia of the left common iliac artery.  4. Atherosclerosis, including aortoiliac and coronary artery disease.   Marland Kitchen History of echocardiogram 09/2015   Echo 1/17: mod LVH, EF 55%, inf-lat HK, Gr 1 DD, mild LAE  . Hx of cardiovascular stress test    Lexiscan Myoview (2/16):  Inferior lateral defect c/w thinning vs small prior infarct, no ischemia, EF 49%, Low Risk  . Hx of echocardiogram    a.  Echocardiogram (02/27/14):  Mod focal basal and mild concentric LVH, EF 50-55%, no RWMA, Gr 1 DD, mild TR, normal RVF  . Hyperlipidemia   . Hypertension   . Iliac artery aneurysm, left ([place])   . Myocardial infarction ([place])   . Stroke [facility]) 09/26/2009    Past Surgical History:  Procedure Laterality Date  . CARDIAC CATHETERIZATION    . CARDIAC CATHETERIZATION N/A 06/25/2015   Procedure: Left Heart Cath and Coronary Angiography;  Surgeon:  Burnell Blanks, MD;  Location: [place] CV LAB;  Service: Cardiovascular;  Laterality: N/A;  . COLONOSCOPY WITH PROPOFOL N/A 07/23/2015   Procedure: COLONOSCOPY WITH PROPOFOL;  Surgeon: [name], MD;  Location: [facility] ENDOSCOPY;  Service: Endoscopy;  Laterality: N/A;  . CORONARY ANGIOPLASTY    . LEFT HEART CATHETERIZATION WITH CORONARY ANGIOGRAM N/A 02/26/2014   Procedure: LEFT HEART CATHETERIZATION WITH CORONARY ANGIOGRAM;  Surgeon: Wellington Hampshire, MD;  Location: [place] CATH LAB;  Service: Cardiovascular;  Laterality: N/A;  . LEFT HEART CATHETERIZATION WITH CORONARY ANGIOGRAM N/A 03/10/2014   Procedure: LEFT HEART CATHETERIZATION WITH CORONARY  ANGIOGRAM;  Surgeon: Jettie Booze, MD;  Location: [facility] CATH LAB;  Service: Cardiovascular;  Laterality: N/A;    Social History  Substance Use Topics  . Smoking status: Former Smoker    Packs/day: 0.25    Years: 38.00    Types: Cigarettes    Quit date: 2008  . Smokeless tobacco: Never Used  . Alcohol use No    Family History  Problem Relation Age of Onset  . Diabetes Father   . Hypertension Father   . Heart attack Father   . Stroke Neg Hx     No Known Allergies  Medication list has been reviewed and updated.  Current Outpatient Prescriptions on File Prior to Visit  Medication Sig Dispense Refill  . amLODipine (NORVASC) 5 MG tablet Take 1 tablet (5 mg total) by mouth daily. 90 tablet 3  . aspirin EC 81 MG EC tablet Take 1 tablet (81 mg total) by mouth daily.    Marland Kitchen glucose monitoring kit (FREESTYLE) monitoring kit 1 each by Does not apply route 4 (four) times daily - after meals and at bedtime. 1 month Diabetic Testing Supplies for QAC-QHS accuchecks. 1 each 1  . Insulin Detemir (LEVEMIR FLEXTOUCH) 100 UNIT/ML Pen Inject 8 Units into the skin daily. 15 mL 2  . Insulin Pen Needle 32G X 8 MM MISC novotwist 32x29m  Pen needles Use once as directed daily 100 each 3  . lisinopril (PRINIVIL,ZESTRIL) 10 MG tablet Take 1 tablet (10 mg  total) by mouth daily. 30 tablet 11  . nitroGLYCERIN (NITROSTAT) 0.4 MG SL tablet Place 1 tablet (0.4 mg total) under the tongue every 5 (five) minutes as needed for chest pain. 25 tablet 1  . ondansetron (ZOFRAN) 4 MG tablet Take 1 tablet (4 mg total) by mouth every 8 (eight) hours as needed for nausea or vomiting. 12 tablet 0  . rosuvastatin (CRESTOR) 40 MG tablet Take 1 tablet (40 mg total) by mouth daily. 30 tablet 11  . VICTOZA 18 MG/3ML SOPN Inject 0.2 mLs (1.2 mg total) into the skin daily. 9 mL 2   No current facility-administered medications on file prior to visit.     Review of Systems:  As per HPI- otherwise negative.   Physical Examination: Vitals:   06/07/17 0859  BP: 136/80  Pulse: 80  Temp: 98.7 F (37.1 C)  SpO2: 100%   Vitals:   06/07/17 0859  Weight: 157 lb 12.8 oz (71.6 kg)  Height: 5' 4"  (1.626 m)   Body mass index is 27.09 kg/m. Ideal Body Weight: Weight in (lb) to have BMI = 25: 145.3  GEN: WDWN, NAD, Non-toxic, A & O x 3, mild overweight, looks well HEENT: Atraumatic, Normocephalic. Neck supple. No masses, No LAD. Ears and Nose: No external deformity. CV: RRR, No M/G/R. No JVD. No thrill. No extra heart sounds. PULM: CTA B, no wheezes, crackles, rhonchi. No retractions. No resp. distress. No accessory muscle use. ABD: S, NT, ND EXTR: No c/c/e NEURO Normal gait.  PSYCH: Normally interactive. Conversant. Not depressed or anxious appearing.  Calm demeanor.  Foot exam today No swelling or other sign of fluid overload noted    Assessment and Plan: Type 2 diabetes mellitus with diabetic neuropathy, with long-term current use of insulin (HCC) - Plan: Comprehensive metabolic panel, metFORMIN (GLUCOPHAGE) 500 MG tablet  Essential hypertension - Plan: amLODipine (NORVASC) 5 MG tablet  Language barrier  Chronic fatigue - Plan: CBC, TSH  Primary insomnia - Plan: traZODone (DESYREL) 50 MG tablet  Chronic diastolic congestive heart failure ([place]) -  Plan: B Nat Peptide  Here today to discuss a few concerns He declines flu and pneumonia vaccines today He has felt tired- will check CBC and TSH However he has not been sleeping well- will have him try a low dose of trazodone at bedtime He is on minimal insulin, and is not using it every day.  Will have him stop insulin and go back on metformin. Start with 500 mg once a day Continue victoza  Will check BNP due to history of diastolic CHF and weight gain, although per pt he is losing weight His son helps with communication today Recheck here in 2 months    Signed Lamar Blinks, MD  Received labs, 9/18 Letter to pt

## 2017-06-07 ENCOUNTER — Ambulatory Visit (INDEPENDENT_AMBULATORY_CARE_PROVIDER_SITE_OTHER): Payer: Medicare Other | Admitting: Family Medicine

## 2017-06-07 ENCOUNTER — Encounter: Payer: Self-pay | Admitting: Family Medicine

## 2017-06-07 VITALS — BP 136/80 | HR 80 | Temp 98.7°F | Ht 64.0 in | Wt 157.8 lb

## 2017-06-07 DIAGNOSIS — Z789 Other specified health status: Secondary | ICD-10-CM

## 2017-06-07 DIAGNOSIS — R5382 Chronic fatigue, unspecified: Secondary | ICD-10-CM | POA: Diagnosis not present

## 2017-06-07 DIAGNOSIS — I200 Unstable angina: Secondary | ICD-10-CM

## 2017-06-07 DIAGNOSIS — E1140 Type 2 diabetes mellitus with diabetic neuropathy, unspecified: Secondary | ICD-10-CM

## 2017-06-07 DIAGNOSIS — F5101 Primary insomnia: Secondary | ICD-10-CM | POA: Diagnosis not present

## 2017-06-07 DIAGNOSIS — I10 Essential (primary) hypertension: Secondary | ICD-10-CM

## 2017-06-07 DIAGNOSIS — Z794 Long term (current) use of insulin: Secondary | ICD-10-CM | POA: Diagnosis not present

## 2017-06-07 DIAGNOSIS — I5032 Chronic diastolic (congestive) heart failure: Secondary | ICD-10-CM | POA: Diagnosis not present

## 2017-06-07 LAB — CBC
HCT: 40.2 % (ref 39.0–52.0)
Hemoglobin: 13.1 g/dL (ref 13.0–17.0)
MCHC: 32.6 g/dL (ref 30.0–36.0)
MCV: 86.8 fl (ref 78.0–100.0)
Platelets: 201 10*3/uL (ref 150.0–400.0)
RBC: 4.63 Mil/uL (ref 4.22–5.81)
RDW: 13.8 % (ref 11.5–15.5)
WBC: 7.3 10*3/uL (ref 4.0–10.5)

## 2017-06-07 LAB — COMPREHENSIVE METABOLIC PANEL
ALT: 13 U/L (ref 0–53)
AST: 19 U/L (ref 0–37)
Albumin: 3.9 g/dL (ref 3.5–5.2)
Alkaline Phosphatase: 71 U/L (ref 39–117)
BUN: 10 mg/dL (ref 6–23)
CO2: 28 mEq/L (ref 19–32)
Calcium: 9.5 mg/dL (ref 8.4–10.5)
Chloride: 106 mEq/L (ref 96–112)
Creatinine, Ser: 1.15 mg/dL (ref 0.40–1.50)
GFR: 67.34 mL/min (ref >60.00)
Glucose, Bld: 105 mg/dL — ABNORMAL HIGH (ref 70–99)
Potassium: 4.2 mEq/L (ref 3.5–5.1)
Sodium: 141 mEq/L (ref 135–145)
Total Bilirubin: 0.3 mg/dL (ref 0.2–1.2)
Total Protein: 7 g/dL (ref 6.0–8.3)

## 2017-06-07 LAB — BRAIN NATRIURETIC PEPTIDE: Pro B Natriuretic peptide (BNP): 63 pg/mL (ref 0.0–100.0)

## 2017-06-07 LAB — TSH: TSH: 1.34 u[IU]/mL (ref 0.35–4.50)

## 2017-06-07 MED ORDER — AMLODIPINE BESYLATE 5 MG PO TABS: 5.0000 mg | ORAL_TABLET | Freq: Every day | ORAL | 3 refills | Status: DC

## 2017-06-07 MED ORDER — TRAZODONE HCL 50 MG PO TABS: 25.0000 mg | ORAL_TABLET | Freq: Every evening | ORAL | 3 refills | Status: DC | PRN

## 2017-06-07 MED ORDER — METFORMIN HCL 500 MG PO TABS: 500.0000 mg | ORAL_TABLET | Freq: Two times a day (BID) | ORAL | 3 refills | Status: DC

## 2017-06-07 NOTE — Patient Instructions (Addendum)
It was good to see you today!  We are going to make a few changes to your medications I refilled your amlodipine- continue taking this for your blood pressure We are going to STOP the insulin as your blood sugar is under pretty good control!  Continue victoza We are going to add in metformin instead of insulin- this is a medication you take by mouth. Start with 1 pill daily.  You can increase to 1 pill twice a day after 2 weeks   I gave you a medication called trazodone to try for sleep. Take a 1/2 tablet at bedtime- you can increase to a whole tablet if needed   I do still encourage you to get a flu shot and pneumonia booster- these immunizations are important to help prevent sickness  I will be in touch with your labs asap  Please come and see me in about 2 months

## 2017-06-08 ENCOUNTER — Other Ambulatory Visit: Payer: Self-pay | Admitting: Pharmacist

## 2017-06-08 NOTE — Patient Outreach (Signed)
[place] [facility]) Care Management  06/08/2017  Barry [name] August 31, 1951 [id number]  Successful call to [name] son this morning to confirm appointment at 11:00AM.    I arrived at [name] house this morning with interpreter at 11:00AM.  [name] did not answer his phone or the door.   I spoke with [name] son, [name], who stated he had tried to call his father multiple times that morning but was unable to get a hold of him.  He will be at home tomorrow and requested that I reschedule our visit for tomorrow morning.   Plan: Home visit with [name] (and son as interpreter) tomorrow, Sept 14, at 11:00 AM.  [name], PharmD, [place] [phone]

## 2017-06-09 ENCOUNTER — Other Ambulatory Visit: Payer: Self-pay | Admitting: Pharmacist

## 2017-06-09 ENCOUNTER — Ambulatory Visit: Payer: Self-pay | Admitting: Pharmacist

## 2017-06-09 NOTE — Patient Outreach (Signed)
[place] [facility]) Care Management  [place]   06/09/2017  Barry [name] 03-08-1951 [id number]  Successful home visit with Barry [name] and his son, Barry [name], as interpreter today at Barry [name] home. HIPAA identifiers verified.   Noted recent office visit with Barry [name] on 9/12 with medication changes as follows:   Stop levemir  Start metformin 538m once daily with meals then increase to BID with meals in 2 weeks  Start trazodone PRN sleep   Subjective: Patient continues to document his blood pressure, blood sugar, and HR in his [facility] daily.    He reports he stopped taking both Levemir and Victoza and that he is feeling less energy with this change.    Patient reports he has used the trazodone once for sleep and it seemed to help.    Patient notes that he sometimes feels dizzy when he is doing yard-work or when he stands up quickly.    Patient reports he has not had a bowel movement in 3 days.   Objective:  06/09/17:  BP: 102/59 HR: 80 CBG: 177 (1 hour after breakfast), 153 (4 hours after food) Weight: 140lb  Encounter Medications: Outpatient Encounter Prescriptions as of 06/09/2017  Medication Sig  . amLODipine (NORVASC) 5 MG tablet Take 1 tablet (5 mg total) by mouth daily.  .Marland Kitchenaspirin EC 81 MG EC tablet Take 1 tablet (81 mg total) by mouth daily.  .Marland Kitchenglucose monitoring kit (FREESTYLE) monitoring kit 1 each by Does not apply route 4 (four) times daily - after meals and at bedtime. 1 month Diabetic Testing Supplies for QAC-QHS accuchecks.  . Insulin Pen Needle 32G X 8 MM MISC novotwist 32x537m Pen needles Use once as directed daily  . lisinopril (PRINIVIL,ZESTRIL) 10 MG tablet Take 1 tablet (10 mg total) by mouth daily.  . metFORMIN (GLUCOPHAGE) 500 MG tablet Take 1 tablet (500 mg total) by mouth 2 (two) times daily with a meal.  . nitroGLYCERIN (NITROSTAT) 0.4 MG SL tablet Place 1 tablet (0.4 mg total) under the tongue every 5 (five) minutes as needed for  chest pain.  . Marland Kitchenndansetron (ZOFRAN) 4 MG tablet Take 1 tablet (4 mg total) by mouth every 8 (eight) hours as needed for nausea or vomiting.  . rosuvastatin (CRESTOR) 40 MG tablet Take 1 tablet (40 mg total) by mouth daily.  . traZODone (DESYREL) 50 MG tablet Take 0.5-1 tablets (25-50 mg total) by mouth at bedtime as needed for sleep.  . Marland KitchenICTOZA 18 MG/3ML SOPN Inject 0.2 mLs (1.2 mg total) into the skin daily.   No facility-administered encounter medications on file as of 06/09/2017.     Functional Status: In your present state of health, do you have any difficulty performing the following activities: 05/15/2017 05/09/2017  Hearing? - Y  Comment - both ears  Vision? - N  Difficulty concentrating or making decisions? - Y  Comment - -  Walking or climbing stairs? - Y  Comment - -  Dressing or bathing? - N  Doing errands, shopping? Y (No Data)  Comment - does not drive due to vision  Preparing Food and eating ? - Y  Comment - signifcant other prepares food  Using the Toilet? - N  In the past six months, have you accidently leaked urine? - N  Do you have problems with loss of bowel control? - N  Managing your Medications? - Y  Comment - stopped taking medications due blood in stool. reports medications are expensive.  Has medicare  Managing your Finances? - Y  Housekeeping or managing your Housekeeping? - N  Some recent data might be hidden    Fall/Depression Screening: Fall Risk  05/18/2017 05/09/2017 11/25/2016  Falls in the past year? Yes Yes No  Number falls in past yr: 1 1 -  Injury with Fall? No No -  Follow up - Falls prevention discussed -   PHQ 2/9 Scores 05/18/2017 05/09/2017 11/25/2016 10/18/2016 10/11/2016 10/07/2015 04/03/2015  PHQ - 2 Score 1 0 0 0 0 0 0    Assessment:  Drugs sorted by system:  Neurologic/Psychologic:Trazodone  Cardiovascular: Amlodipine, aspirin 54m, lisinopril, rosuvastatin, PRN NTG  Endocrine:Metformin, Victoza  Medication issues noted: -Not  taking Victoza due to miscommunication at recent office visit.  I counseled patient to resume Victoza per Dr. CArlyn Dunninginstructions and only stop the Levemir.  Patient voiced understanding.   -Not taking rosuvastatin.  [facility]  -Almost out of VIctoza and lisinopril.  [facility]Currently taking metformin daily and not aware of instructions to increase to BID with meals in 2 weeks.  I counseled patient on dose change and he voiced understanding.    Other issues noted: -Constipated. Counseled Barry [name] to try OTC senakot and Miralax and to call Dr. CEdilia Boif problem continues -Dizziness - Possibly related to blood pressure.  Noted diastolic readings often high 50s-60s.  Counseled Barry [name] to move slowly when changing from sitting to standing, climbing stairs.  Recommended he bring his log-book with him to his appointments.  Patient's son will pick up medication refills for him today.    I relayed a message from TMenaregarding Medicare.  LCSW is still working on this as well as vision and dental inquires from patient.    I spoke with TSurgical Suite Of Coastal VirginiaRN ATomasa Randregarding patient and she will join me for the next home visit.     Plan: I will route my note to Dr. CEdilia Boregarding medication issues and dizziness.   I will schedule another home visit with Barry [name] on Sept 27, 2018 at 1:00 PM to follow-up on his medication adherence and request an interpreter to join.    CRalene Bathe PharmD, [phone]

## 2017-06-20 ENCOUNTER — Ambulatory Visit: Payer: Self-pay | Admitting: *Deleted

## 2017-06-22 ENCOUNTER — Other Ambulatory Visit: Payer: Self-pay

## 2017-06-22 ENCOUNTER — Other Ambulatory Visit: Payer: Self-pay | Admitting: Pharmacist

## 2017-06-22 NOTE — Patient Outreach (Signed)
[facility]) Care Management   06/22/2017  Barry [name] [birth year] [id number]  Barry [name] is an 66 y.o. 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  Joint visit with Dighton Summe Subjective:  Per translator patient stopped taking his medications 10 days. Patient reports that he has not checked his CBG for 10 days.  Patient reports that he has been feeling better off his medications.  Reports that he feels great.  Non fasting CBG 214 during home visit.  Repeat CBG 40 minutes later at home visit and reading of 286. Self monitored BP 150/84,  74.  Reports stopped taking his medications because he was constipated.  Reports that he feels good that he does not want to take his medications. Patient reports that he will take his medications when he feels like he needs it.  Denies chest pain and shortness of breath. No swelling. Objective:  Awake and alert. Ambulating without difficulty. Vitals:   06/22/17 1346  BP: (!) 148/72  Pulse: 72  Resp: 18  SpO2: 99%  Weight: 155 lb (70.3 kg)   Review of Systems  Constitutional: Negative.   HENT: Negative.   Eyes: Negative.   Respiratory: Negative.   Cardiovascular: Negative.   Gastrointestinal: Positive for constipation.       Reports recent constipation. Since stopping medications he is now having 2-3 bowel movements a day.   Genitourinary: Negative.   Musculoskeletal: Positive for joint pain.       Complain of feet pain.   Skin: Negative.   Neurological: Negative.   Endo/Heme/Allergies: Negative.   Psychiatric/Behavioral: Negative.     Physical Exam  Constitutional: He is oriented to person, place, and time. He appears well-developed and well-nourished.  Cardiovascular: Normal rate, regular rhythm, normal heart sounds and intact distal pulses.   Respiratory: Effort normal and breath sounds normal.  GI: Soft. Bowel sounds are normal.  Musculoskeletal: Normal range of motion. He exhibits no edema.  Neurological: He is alert and  oriented to person, place, and time.  Skin: Skin is warm and dry.  Feet without lesions or sores.   Psychiatric: He has a normal mood and affect. His behavior is normal. Judgment and thought content normal.    Encounter Medications:   Outpatient Encounter Prescriptions as of 06/22/2017  Medication Sig  . amLODipine (NORVASC) 5 MG tablet Take 1 tablet (5 mg total) by mouth daily.  Marland Kitchen aspirin EC 81 MG EC tablet Take 1 tablet (81 mg total) by mouth daily.  Marland Kitchen glucose monitoring kit (FREESTYLE) monitoring kit 1 each by Does not apply route 4 (four) times daily - after meals and at bedtime. 1 month Diabetic Testing Supplies for QAC-QHS accuchecks.  . Insulin Pen Needle 32G X 8 MM MISC novotwist 32x47m  Pen needles Use once as directed daily  . lisinopril (PRINIVIL,ZESTRIL) 10 MG tablet Take 1 tablet (10 mg total) by mouth daily.  . metFORMIN (GLUCOPHAGE) 500 MG tablet Take 1 tablet (500 mg total) by mouth 2 (two) times daily with a meal.  . nitroGLYCERIN (NITROSTAT) 0.4 MG SL tablet Place 1 tablet (0.4 mg total) under the tongue every 5 (five) minutes as needed for chest pain.  . rosuvastatin (CRESTOR) 40 MG tablet Take 1 tablet (40 mg total) by mouth daily. (Patient not taking: Reported on 06/09/2017)  . traZODone (DESYREL) 50 MG tablet Take 0.5-1 tablets (25-50 mg total) by mouth at bedtime as needed for sleep.  .Marland KitchenVICTOZA 18 MG/3ML SOPN Inject 0.2 mLs (1.2 mg total) into the skin  daily.   No facility-administered encounter medications on file as of 06/22/2017.     Functional Status:   In your present state of health, do you have any difficulty performing the following activities: 05/15/2017 05/09/2017  Hearing? - Y  Comment - both ears  Vision? - N  Difficulty concentrating or making decisions? - Y  Comment - -  Walking or climbing stairs? - Y  Comment - -  Dressing or bathing? - N  Doing errands, shopping? Y (No Data)  Comment - does not drive due to vision  Preparing Food and eating ? - Y   Comment - signifcant other prepares food  Using the Toilet? - N  In the past six months, have you accidently leaked urine? - N  Do you have problems with loss of bowel control? - N  Managing your Medications? - Y  Comment - stopped taking medications due blood in stool. reports medications are expensive.   Has medicare  Managing your Finances? - Y  Housekeeping or managing your Housekeeping? - N  Some recent data might be hidden    Fall/Depression Screening:    Fall Risk  05/18/2017 05/09/2017 11/25/2016  Falls in the past year? Yes Yes No  Number falls in past yr: 1 1 -  Injury with Fall? No No -  Follow up - Falls prevention discussed -   PHQ 2/9 Scores 05/18/2017 05/09/2017 11/25/2016 10/18/2016 10/11/2016 10/07/2015 04/03/2015  PHQ - 2 Score 1 0 0 0 0 0 0    Assessment:   (1) CBG reading of 214 and 286.  Not eating a balanced diet.  Stopped CBG monitoring 10 days ago. (2) stopped medications due to constipation. (3) reports feeling well. (4) all medications reviewed by [place]. See pharmacy note  Plan:  (1) Reviewed reasons for patient to self monitor and follow DM diet. Encouraged patient to start self monitoring BP and CBG again daily and recording in [facility] calendar.  (2) reviewed reasons not to stop medications.  Reviewed with patient the importance of taking all medications as prescribed. Discussed with patient if he would rather take pills or insulin shots and he states pills.  (3) reviewed with patient the reason he has been feeling better is due to DM under good control.  (4) great time and explanation with patient about the importance of taking medications as prescribed and not to abruptly stop medications. Patient states that he will start retaking all his medications.    Next home visit planned for 1 week. THN CM Care Plan Problem One     Most Recent Value  Care Plan Problem One  Recent admission for chest pain  Role Documenting the Problem One  Care Management  [place] for Problem One  Active  THN Long Term Goal   Patient will report no readmission in the next 60 days.   THN Long Term Goal Start Date  05/09/17  Interventions for Problem One Long Term Goal  Reviewed importance of daily self management. Encouraged patient to take medications as prescribed.   THN CM Short Term Goal #1   Paitent will monitor and record CBG daily for the necxt 30 days.   THN CM Short Term Goal #1 Start Date  06/22/17  Interventions for Short Term Goal #1  Encouraged patient to continue to monitor daily.  THN CM Short Term Goal #2   Patient will report weighing daily and recording for the next 30 days.   THN CM Short Term  Goal #2 Start Date  06/22/17  Interventions for Short Term Goal #2  encouraged patient to continue to weigh daily.  THN CM Short Term Goal #3  Patient will report home visits by [facility] social worker and [facility] pharmay in the next 2 weeks.   THN CM Short Term Goal #3 Start Date  05/09/17  [facility] CM Short Term Goal #3 Met Date  05/18/17  Interventions for Short Tern Goal #3  Arranged in home appointments with [facility] team members.       [name], RN, BSN, CEN [facility] [employer] [phone]

## 2017-06-22 NOTE — Patient Outreach (Signed)
[facility]) Care Management  06/22/2017  Florence Lauf Jul 21, 1951 [id number]   Home visit with Mr. Stigall, [facility] RN [name], [place], and interpretor.  HIPAA identifiers verified.   During his last office visit on 06/07/2017 with Dr. Edilia Bo, Mr. Gebel was taken off his insulin and metformin was added to his regimen.   Today, Mr. Durocher reports he has stopped taking most of his medications in the last 10 days due to feeling lethargic and constipated.  He reports his stomach feels much better now that he has stopped his medication and that he has 2-3 bowel movements daily.  He reports his leg pain has worsened however.  He continues to take trazodone and gabapentin at night for sleep and pain and continues to take a daily aspirin.    Vitals during visit: Weight: 155lb  Blood pressure: Automatic: 150/84 Manual: 148/72  CBGs: 214, 286  Medications Reviewed Today    Reviewed by Rudean Haskell, RPH (Pharmacist) on 06/09/17 at 1452  Med List Status: <None>  Medication Order Taking? Sig Documenting Provider Last Dose Status Informant  amLODipine (NORVASC) 5 MG tablet [id number] Yes Take 1 tablet (5 mg total) by mouth daily. Copland, Gay Filler, MD Taking Active   aspirin EC 81 MG EC tablet [id number] Yes Take 1 tablet (81 mg total) by mouth daily. Geradine Girt, DO Taking Active   glucose monitoring kit (FREESTYLE) monitoring kit [id number] Yes 1 each by Does not apply route 4 (four) times daily - after meals and at bedtime. 1 month Diabetic Testing Supplies for QAC-QHS accuchecks. Jonetta Osgood, MD Taking Active Family Member  Insulin Pen Needle 32G X 8 MM MISC [id number] Yes novotwist 32x77m  Pen needles Use once as directed daily Copland, JGay Filler MD Taking Active Family Member  lisinopril (PRINIVIL,ZESTRIL) 10 MG tablet [id number] Take 1 tablet (10 mg total) by mouth daily. WRichardson DoppT, PA-C Taking Active Family Member  metFORMIN (GLUCOPHAGE) 500 MG tablet  [id number] Take 1 tablet (500 mg total) by mouth 2 (two) times daily with a meal. Copland, JGay Filler MD Taking Active   nitroGLYCERIN (NITROSTAT) 0.4 MG SL tablet [id number] Place 1 tablet (0.4 mg total) under the tongue every 5 (five) minutes as needed for chest pain. GWendie Agreste MD Taking Active Family Member        Discontinued 06/09/17 1452 (Completed Course)   rosuvastatin (CRESTOR) 40 MG tablet [id number] Take 1 tablet (40 mg total) by mouth daily.  Patient not taking:  Reported on 06/09/2017   WLiliane Shi PA-C Not Taking Active Family Member  traZODone (DESYREL) 50 MG tablet [id number] Take 0.5-1 tablets (25-50 mg total) by mouth at bedtime as needed for sleep. Copland, JGay Filler MD Taking Active   VICTOZA 18 MG/3ML SOPN [id number] Inject 0.2 mLs (1.2 mg total) into the skin daily. Copland, JGay Filler MD Taking Active          Assessment:  Medication non-adherence: Mr. Garnett self-discontinued all of his diabetic and cardiovascular medications with the exception of aspirin due to stomach discomfort from constipation which he believes is caused by the medications.   Blood pressure and blood glucose readings were both elevated during visit.  Mr. Manni was counseled on the importance of these medications for his health and preventing further cardiovascular harm.  He was counseled to discuss medication issues with his doctor or [facility] before making any changes.  We reviewed diet  and the importance of fiber, water, and exercise to prevent constipation.  Mr. Brazell verbalized understanding through interpretor and stated he would resume taking his medications today and that he would incorporate more vegetables into his diet.    Plan: Home visit with Mr. Fauth next Wednesday, Oct 3, at 9:30 AM to follow-up on medication adherence.   I will route my note to Dr. Lorelei Pont regarding discontinuation of medications.   [name], PharmD, [place] [phone]

## 2017-06-26 ENCOUNTER — Ambulatory Visit: Payer: Medicare Other | Admitting: Physician Assistant

## 2017-06-27 ENCOUNTER — Ambulatory Visit (INDEPENDENT_AMBULATORY_CARE_PROVIDER_SITE_OTHER): Payer: Medicare Other | Admitting: Physician Assistant

## 2017-06-27 ENCOUNTER — Encounter (INDEPENDENT_AMBULATORY_CARE_PROVIDER_SITE_OTHER): Payer: Self-pay

## 2017-06-27 ENCOUNTER — Encounter: Payer: Self-pay | Admitting: Physician Assistant

## 2017-06-27 VITALS — BP 138/68 | HR 62 | Ht 64.0 in | Wt 155.1 lb

## 2017-06-27 DIAGNOSIS — I2510 Atherosclerotic heart disease of native coronary artery without angina pectoris: Secondary | ICD-10-CM

## 2017-06-27 DIAGNOSIS — I739 Peripheral vascular disease, unspecified: Secondary | ICD-10-CM

## 2017-06-27 DIAGNOSIS — I10 Essential (primary) hypertension: Secondary | ICD-10-CM

## 2017-06-27 DIAGNOSIS — I200 Unstable angina: Secondary | ICD-10-CM

## 2017-06-27 DIAGNOSIS — R0683 Snoring: Secondary | ICD-10-CM | POA: Diagnosis not present

## 2017-06-27 DIAGNOSIS — E785 Hyperlipidemia, unspecified: Secondary | ICD-10-CM

## 2017-06-27 NOTE — Addendum Note (Signed)
Addended by: Michae Kava on: 06/27/2017 12:15 PM   Modules accepted: Orders

## 2017-06-27 NOTE — Progress Notes (Signed)
Cardiology Office Note:    Date:  06/27/2017   ID:  Barry [name], DOB 21-Jun-1951, MRN [id number]  PCP:  Barry Mclean, MD  Cardiologist:  Dr. Loralie [name] >> Dr. Harrell Gave [name] / Barry Dopp, PA-C    Referring MD: Barry Pont Gay Filler, MD   Chief Complaint  Patient presents with  . Coronary Artery Disease    Follow-up    History of Present Illness:    Barry [name] is a 66 y.o. male with a hx of CAD, chronic superior mesenteric artery dissection, prior stroke, DM2, HTN, CKD, HL. He originally presented in 02/2014 with non-STEMI. LHC demonstrated a ruptured plaque and thrombus in the proximal RCA which was treated with a DES. He was readmitted several weeks later with unstable angina and LHC demonstrated high-grade stenosis in the CFX which was treated with a DES.Lexiscan Myoview in 2/16 was low risk with inferior lateral defect consistent with thinning versus small prior infarct. There was no ischemia. EF was 49%. LHC was arranged 9/16 for recurrent chest pain and demonstrated 2v CAD with patent mid LCx/OM2 stent and patent RCA stent, mod stenosis in a small caliber sub-branch of OM2 jailed by the old stent (unchanged from 2015), mod stenosis in a small to mod caliber ostial PDA (unchanged from 2015). Med Rx was recommended. He was seen in the emergency room 7/18 for syncope felt to be related to dehydration.  He was admitted to the hospital in 8/18 with chest discomfort. Cardiac enzymes remained normal. Outpatient follow-up with cardiology was recommended.   [name] returns for follow-up. He is seen today with the assistance of an interpreter. He denies any recurrent symptoms of chest discomfort. He denies significant shortness of breath. He denies syncope, orthopnea, paroxysmal nocturnal dyspnea or edema. He has been dizzy at times but this does not seem to be significant for him. He does still tired and admits to a history of snoring.  Prior CV studies:   The following studies were reviewed  today:  Abdominal CTA 2/17 IMPRESSION: 1. Stable non flow limiting dissection in the mid SMA, with 14 mm aneurysmal dilatation of the affected segment as before. 2. 17 mm fusiform left common iliac artery aneurysm, stable. 3. Cholelithiasis. 4. Right nephrolithiasis without hydronephrosis.  Echo 10/27/15 Mod LVH, EF 55, inf-lat HK, Gr 1 DD, trivial AI, mild LAE  LHC 9/16 LAD: Ostial 30%, prox 30%, dist 30%, D1 30% LCx: prox 20%, OM2 stent ok with 10% ISR, small lateral OM2 80% RCA: Ostial stent ok with 15%, mid 20%, dist 20%, ostial RPDA 60% EF 55-65% 1. Double vessel CAD with patent stent mid Circumflex/OM2 and patent stent RCA 2. Moderate stenosis in the small caliber sub-branch of OM2 where it is jailed by the old stent. This is a small caliber branch vessel. Unchanged in appearance from last cath in 2015.  3. Moderate stenosis in the small to moderate caliber ostial PDA, unchanged from last cath in 2015.  4. Normal LV function Recommendations: Continue medical therapy for CAD.   Myoview 2/16 Inferior lateral defect consistent with thinning vs small prior infarct; no ischemia. EF 49%  Past Medical History:  Diagnosis Date  . CAD (coronary artery disease)    a. LHC (02/26/14):  mLAD 20 (faint L>R collats), mCFX 50, pRCA 95 (plaque rupture) >> PCI:  Xience DES to pRCA (severe tortuosity of R innominate artery >> needs L radial or FA in future);  b. LHC (03/10/14):  CFX 90, oOM 70, pRCA stent patent >>  PCI:  Xience DES to Barry [name]   . Carotid stenosis    a. Carotid US (02/27/14):  Bilateral ICA 1-39%  . Depression   . Diabetes mellitus   . 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[place])    a. Mesenteric Artery Duplex (7/15):  pSMA chronic dissection with aneurysmal dilation of 1.29 cm ([facility]);  b.  Chest CTA (02/26/14):  IMPRESSION:  1. No aortic dissection or other acute abnormality.  2. 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  3. Stable 17 mm ectasia of the left common iliac  artery.  4. Atherosclerosis, including aortoiliac and coronary artery disease.   Marland Kitchen History of echocardiogram 09/2015   Echo 1/17: mod LVH, EF 55%, inf-lat HK, Gr 1 DD, mild LAE  . Hx of cardiovascular stress test    Lexiscan Myoview (2/16):  Inferior lateral defect c/w thinning vs small prior infarct, no ischemia, EF 49%, Low Risk  . Hx of echocardiogram    a.  Echocardiogram (02/27/14):  Mod focal basal and mild concentric LVH, EF 50-55%, no RWMA, Gr 1 DD, mild TR, normal RVF  . Hyperlipidemia   . Hypertension   . Iliac artery aneurysm, left ([place])   . Myocardial infarction ([place])   . Stroke ([place]) 09/26/2009  1. HTN 2. Type II diabetes 3. CVA in 2011 4. AAA: 2.2 cm in 3/15.  5. PAD: Left CIA with penetrating ulcer. 6. Chronic superior mesenteric artery dissection, followed by Dr Trula Slade. 7. CKD 8. CAD: NSTEMI 6/15 with 95% proximal RCA stenosis treated with DES. Unstable angina later in 6/15, this time had DES to 90% mLCx. Echo (6/15) with EF 50-55%, moderate focal basal septal hypertrophy, normal RV size and systolic function. Lexiscan Cardiolite (2/16) with EF 49%, small fixed inferolateral defect with no ischemia.  9. Carotid dopplers (6/15) with mild stenosis.  10. Hyperlipidemia: Did not tolerate atorvastatin.  Past Surgical History:  Procedure Laterality Date  . CARDIAC CATHETERIZATION    . CARDIAC CATHETERIZATION N/A 06/25/2015   Procedure: Left Heart Cath and Coronary Angiography;  Surgeon: Burnell Blanks, MD;  Location: [facility] CV LAB;  Service: Cardiovascular;  Laterality: N/A;  . COLONOSCOPY WITH PROPOFOL N/A 07/23/2015   Procedure: COLONOSCOPY WITH PROPOFOL;  Surgeon: [name], MD;  Location: [facility] ENDOSCOPY;  Service: Endoscopy;  Laterality: N/A;  . CORONARY ANGIOPLASTY    . LEFT HEART CATHETERIZATION WITH CORONARY ANGIOGRAM N/A 02/26/2014   Procedure: LEFT HEART CATHETERIZATION WITH CORONARY ANGIOGRAM;  Surgeon: Wellington Hampshire, MD;  Location: [place] CATH  LAB;  Service: Cardiovascular;  Laterality: N/A;  . LEFT HEART CATHETERIZATION WITH CORONARY ANGIOGRAM N/A 03/10/2014   Procedure: LEFT HEART CATHETERIZATION WITH CORONARY ANGIOGRAM;  Surgeon: Jettie Booze, MD;  Location: [facility] CATH LAB;  Service: Cardiovascular;  Laterality: N/A;    Current Medications: Current Meds  Medication Sig  . amLODipine (NORVASC) 5 MG tablet Take 1 tablet (5 mg total) by mouth daily.  Marland Kitchen aspirin EC 81 MG EC tablet Take 1 tablet (81 mg total) by mouth daily.  Marland Kitchen gabapentin (NEURONTIN) 300 MG capsule TAKE 1 (ONE) CAPSULE BY MOUTH THREE TIMES DAILY AND TWO CAPSULE BY MOUTH ONCE DAILY AT NIGHT AS NEEDED FOR LEG PAIN  . glucose monitoring kit (FREESTYLE) monitoring kit 1 each by Does not apply route 4 (four) times daily - after meals and at bedtime. 1 month Diabetic Testing Supplies for QAC-QHS accuchecks.  . Insulin Pen Needle 32G X 8 MM MISC novotwist 32x37m  Pen needles Use once as directed daily  .  lisinopril (PRINIVIL,ZESTRIL) 10 MG tablet Take 1 tablet (10 mg total) by mouth daily.  . metFORMIN (GLUCOPHAGE) 500 MG tablet Take 1 tablet (500 mg total) by mouth 2 (two) times daily with a meal.  . nitroGLYCERIN (NITROSTAT) 0.4 MG SL tablet Place 1 tablet (0.4 mg total) under the tongue every 5 (five) minutes as needed for chest pain.  . rosuvastatin (CRESTOR) 40 MG tablet Take 1 tablet (40 mg total) by mouth daily.  . traZODone (DESYREL) 50 MG tablet Take 0.5-1 tablets (25-50 mg total) by mouth at bedtime as needed for sleep.     Allergies:   Patient has no known allergies.   Social History   Social History  . Marital status: Married    Spouse name: N/A  . Number of children: 3  . Years of education: N/A   Occupational History  . Retired    Social History Main Topics  . Smoking status: Former Smoker    Packs/day: 0.25    Years: 38.00    Types: Cigarettes    Quit date: 2008  . Smokeless tobacco: Never Used  . Alcohol use No  . Drug use: No  . Sexual  activity: Yes   Other Topics Concern  . None   Social History Narrative   Marital: married.      Lives: with son,wife.       Children: 3 children; 6 grandchildren      Employed: unemployed; disability unknown reason/CVA L sided weakness      Tobacco:  Quit 2012; smoked 40 years.       Alcohol: no drinking now; social in past.       Drugs: none       Exercise: sporadic.       ADLs:  No driving since CVA.     Family Hx: The patient's family history includes Diabetes in his father; Heart attack in his father; Hypertension in his father. There is no history of Stroke.  ROS:   Please see the history of present illness.    Review of Systems  Musculoskeletal: Positive for back pain and joint pain.  Neurological: Positive for dizziness and loss of balance.   All other systems reviewed and are negative.   EKGs/Labs/Other Test Reviewed:    EKG:  EKG is  ordered today.  The ekg ordered today demonstrates NSR, HR 62, normal axis, nonspecific ST-T wave changes, LVH, QTc 464 ms, similar to prior tracings  Recent Labs: 12/01/2016: B Natriuretic Peptide 28.8 06/07/2017: ALT 13; BUN 10; Creatinine, Ser 1.15; Hemoglobin 13.1; Platelets 201.0; Potassium 4.2; Pro B Natriuretic peptide (BNP) 63.0; Sodium 141; TSH 1.34   Recent Lipid Panel Lab Results  Component Value Date/Time   CHOL 241 (H) 10/11/2016 11:56 AM   TRIG 243 (H) 10/11/2016 11:56 AM   HDL 35 (L) 10/11/2016 11:56 AM   CHOLHDL 6.9 (H) 10/11/2016 11:56 AM   CHOLHDL 5 04/20/2015 09:51 AM   LDLCALC 157 (H) 10/11/2016 11:56 AM   LDLDIRECT 101.0 04/20/2015 09:51 AM    Physical Exam:    VS:  BP 138/68   Pulse 62   Ht 5' 4"  (1.626 m)   Wt 155 lb 1.9 oz (70.4 kg)   BMI 26.63 kg/m     Wt Readings from Last 3 Encounters:  06/27/17 155 lb 1.9 oz (70.4 kg)  06/22/17 155 lb (70.3 kg)  06/07/17 157 lb 12.8 oz (71.6 kg)     Physical Exam  Constitutional: He is oriented to person, place, and  time. He appears well-developed and  well-nourished. No distress.  HENT:  Head: Normocephalic and atraumatic.  Eyes: No scleral icterus.  Neck: No JVD present. Carotid bruit is not present.  Cardiovascular: Normal rate and regular rhythm.   No murmur heard. Pulmonary/Chest: Effort normal. He has no rales.  Abdominal: Soft.  Musculoskeletal: He exhibits no edema.  Neurological: He is alert and oriented to person, place, and time.  Skin: Skin is warm and dry.  Psychiatric: He has a normal mood and affect.    ASSESSMENT:    1. Coronary artery disease involving native coronary artery of native heart without angina pectoris   2. PAD (peripheral artery disease) ([place])   3. Essential hypertension   4. Dyslipidemia, goal LDL below 70   5. Snoring    PLAN:    In order of problems listed above:  1. Coronary artery disease involving native coronary artery of native heart without angina pectoris  S/p NSTEMI in 2015 treated with DES to the RCA and subsequent PCI to the LCx 2/2 [place]. Myoview in 2/16 was neg for ischemia. LHC in 05/2015 demonstrated patent stents to the RCA and LCx and otherwise stable anatomy compared to the films in 2015. He is treated medically. He had a recent admission to the hospital with chest pain. He ruled out for myocardial infarction. He denies any recurrent chest symptoms. Continue current therapy which includes aspirin, statin.    2. PAD (peripheral artery disease) (HCC) Penetrating ulcer left CIA and chronic SMA dissection. Last CTA abdomen with stable findings.  He is followed by [facility]. Follow-up is planned in 10/2017.  3. Essential hypertension The patient's blood pressure is controlled on his current regimen.  Continue current therapy.    4. Dyslipidemia, goal LDL below 70  Continue statin. Arrange fasting lipids.  5. Snoring He admits to fatigue. He has been told that he snores. I have asked him to have his wife monitor his sleeping. If she witnesses apneic episodes, we will need to arrange a  sleep study.   Dispo:  Return in about 6 months (around 12/26/2017) for Routine Follow Up, w/ Dr. Saunders Revel, or Barry Dopp, PA-C.   Medication Adjustments/Labs and Tests Ordered: Current medicines are reviewed at length with the patient today.  Concerns regarding medicines are outlined above.  Tests Ordered: Orders Placed This Encounter  Procedures  . Lipid Profile  . Hepatic function panel  . EKG 12-Lead   Medication Changes: No orders of the defined types were placed in this encounter.   Signed, Barry Dopp, PA-C  06/27/2017 12:11 PM    [facility] [place], [place], [place]  [address] Phone: [phone]; Fax: [phone]

## 2017-06-27 NOTE — Patient Instructions (Addendum)
Medication Instructions:  1. Your physician recommends that you continue on your current medications as directed. Please refer to the Current Medication list given to you today.   Labwork: 07/28/17 FOR FASTING LIPID PANEL (THIS IS CHECK YOUR CHOLESTEROL PANEL); LAB OPENS AT 7:30 AM, MAKE SURE NOTHING TO EAT OR DRINK AFTER MIDNIGHT THE NIGHT BEFORE THE LAB WORK  Testing/Procedures: NONE ORDERED   Follow-Up: Your physician wants you to follow-up in: [place] DR. END  You will receive a reminder letter in the mail two months in advance. If you don't receive a letter, please call our office to schedule the follow-up appointment.   Any Other Special Instructions Will Be Listed Below (If Applicable).     If you need a refill on your cardiac medications before your next appointment, please call your pharmacy.

## 2017-06-28 ENCOUNTER — Other Ambulatory Visit: Payer: Self-pay

## 2017-06-28 ENCOUNTER — Telehealth: Payer: Self-pay | Admitting: Physician Assistant

## 2017-06-28 ENCOUNTER — Other Ambulatory Visit: Payer: Self-pay | Admitting: Pharmacist

## 2017-06-28 DIAGNOSIS — I2510 Atherosclerotic heart disease of native coronary artery without angina pectoris: Secondary | ICD-10-CM

## 2017-06-28 DIAGNOSIS — E785 Hyperlipidemia, unspecified: Secondary | ICD-10-CM

## 2017-06-28 MED ORDER — ROSUVASTATIN CALCIUM 40 MG PO TABS: 40.0000 mg | ORAL_TABLET | Freq: Every day | ORAL | 3 refills | Status: DC

## 2017-06-28 NOTE — Patient Outreach (Signed)
[facility]) Care Management  06/28/2017  Cai Cloyd Oct 23, 1950 [id number]  Subjective: Successful home visit with Mr. Tetzloff, [facility] RN [name], interpretor, and Mr. Bard significant other.  Mr. Champa reports he is feeling good. He has been walking around ~15 minutes a day and having regular bowel movements.  He reports he is taking all his medications as scheduled.  He has not needed to take any gabapentin or trazodone in the last few days.  He reports he is going to continue to take his medications as scheduled.   Mr. Nestle had an office visit with his cardiologist yesterday and no changes were made to his medications per note by PA Richardson Dopp.   Noted Victoza removed from his medication list however by CMA.   Per patient's log book:  Blood sugars:  102-147 Blood pressure: 128-162/82-92  Objective:  Blood pressure during visit: 126/60  Medications Reviewed Today    Reviewed by Sharmon Revere (Physician Assistant) on 06/27/17 at 1206  Med List Status: <None>  Medication Order Taking? Sig Documenting Provider Last Dose Status Informant  amLODipine (NORVASC) 5 MG tablet [id number] Yes Take 1 tablet (5 mg total) by mouth daily. Copland, Gay Filler, MD Taking Active   aspirin EC 81 MG EC tablet [id number] Yes Take 1 tablet (81 mg total) by mouth daily. Geradine Girt, DO Taking Active   gabapentin (NEURONTIN) 300 MG capsule [id number] Yes TAKE 1 (ONE) CAPSULE BY MOUTH THREE TIMES DAILY AND TWO CAPSULE BY MOUTH ONCE DAILY AT NIGHT AS NEEDED FOR LEG PAIN [provider] Taking Active   glucose monitoring kit (FREESTYLE) monitoring kit [id number] Yes 1 each by Does not apply route 4 (four) times daily - after meals and at bedtime. 1 month Diabetic Testing Supplies for QAC-QHS accuchecks. Jonetta Osgood, MD Taking Active Family Member  Insulin Pen Needle 32G X 8 MM MISC [id number] Yes novotwist 32x77m  Pen needles Use once as directed daily Copland, JGay Filler MD Taking  Active Family Member  lisinopril (PRINIVIL,ZESTRIL) 10 MG tablet [id number] Take 1 tablet (10 mg total) by mouth daily. WRichardson DoppT, PA-C Taking Active Family Member  metFORMIN (GLUCOPHAGE) 500 MG tablet [id number] Take 1 tablet (500 mg total) by mouth 2 (two) times daily with a meal. Copland, JGay Filler MD Taking Active   nitroGLYCERIN (NITROSTAT) 0.4 MG SL tablet [id number] Place 1 tablet (0.4 mg total) under the tongue every 5 (five) minutes as needed for chest pain. GWendie Agreste MD Taking Active Family Member  rosuvastatin (CRESTOR) 40 MG tablet [id number] Take 1 tablet (40 mg total) by mouth daily. WRichardson DoppT, PA-C Taking Active Family Member  traZODone (DESYREL) 50 MG tablet [id number] Take 0.5-1 tablets (25-50 mg total) by mouth at bedtime as needed for sleep. Copland, JGay Filler MD Taking Active          Assessment:  Medication adherence: Per patient report, he is now taking all his medications as instructed by his physician.  His blood pressure and blood sugar values are much improved.    Patient requests assistance with refills on his pen needles, test strips, and lancets.  Per [place] the pen needles will be due for a refill on 07/15/2017.  He currently has at least 45 day supply.  [facility] will fill the test strips and lancets today for patient.    Victoza removed from medication list by CMA at cardiology office yesterday.  I clarified with provider  office that this was in error and added victoza back to patient's medication list.   Crestor will need to be refilled in 1-2 weeks and is currently a 30 day supply.  Provider office will call in new prescription for a 90 day supply for patient as he has difficulty calling in refills with language barrier.    Plan: Home visit scheduled for 07/11/2017 at [place].  Interpreting services have been requested.   I will route my note to Dr. Lorelei Pont.    [name], PharmD, [place] [phone]

## 2017-06-28 NOTE — Telephone Encounter (Signed)
Spoke with the patient's son about his refill being filled and he expressed understanding and will pick it up later today.

## 2017-06-28 NOTE — Telephone Encounter (Signed)
New message   *STAT* If patient is at the pharmacy, call can be transferred to refill team.   1. Which medications need to be refilled? (please list name of each medication and dose if known) Crestor 40mg    2. Which pharmacy/location (including street and city if local pharmacy) is medication to be sent to?  3. Do they need a 30 day or 90 day supply? [place]

## 2017-06-28 NOTE — Patient Outreach (Addendum)
[facility]) Care Management   06/28/2017  Barry [name] 02/21/1951 [id number] Interpreter present. Barry [name] is an 66 y.o. male  Subjective: Patient reports that he is feeling good. Reports he saw cardiology yesterday and everything went well.  Reports that he is taking his medications as prescribed.  Reports that he is eating more veggies.  CBG range in the last week   102-147.   BP range of 128-162   Todays CBG of 105.   Reports bowel movements are normal.     Objective:  Awake and alert per translator. Lungs clear Vitals:   06/28/17 0945  BP: 126/60  Pulse: 65  Resp: 18  SpO2: 98%  Weight: 150 lb (68 kg)   Review of Systems  Constitutional: Negative.   HENT: Negative.   Eyes: Negative.   Respiratory: Negative.   Cardiovascular: Negative.   Gastrointestinal:       Reports normal bowel movements now  Genitourinary: Negative.   Musculoskeletal: Negative.   Skin: Negative.   Neurological: Negative.   Endo/Heme/Allergies: Negative.   Psychiatric/Behavioral: Negative.     Physical Exam  Constitutional: He is oriented to person, place, and time. He appears well-developed and well-nourished.  Cardiovascular: Normal rate, normal heart sounds and intact distal pulses.   Respiratory: Effort normal and breath sounds normal.  GI: Soft.  Musculoskeletal: Normal range of motion. He exhibits edema.  Neurological: He is alert and oriented to person, place, and time.  Per translator  Skin: Skin is warm and dry.  Feet without lesions or open sores.   Psychiatric: He has a normal mood and affect. His behavior is normal. Judgment and thought content normal.    Encounter Medications:   Outpatient Encounter Prescriptions as of 06/28/2017  Medication Sig  . amLODipine (NORVASC) 5 MG tablet Take 1 tablet (5 mg total) by mouth daily.  Marland Kitchen aspirin EC 81 MG EC tablet Take 1 tablet (81 mg total) by mouth daily.  Marland Kitchen gabapentin (NEURONTIN) 300 MG capsule TAKE 1 (ONE) CAPSULE BY  MOUTH THREE TIMES DAILY AND TWO CAPSULE BY MOUTH ONCE DAILY AT NIGHT AS NEEDED FOR LEG PAIN  . glucose monitoring kit (FREESTYLE) monitoring kit 1 each by Does not apply route 4 (four) times daily - after meals and at bedtime. 1 month Diabetic Testing Supplies for QAC-QHS accuchecks.  . Insulin Pen Needle 32G X 8 MM MISC novotwist 32x4m  Pen needles Use once as directed daily  . lisinopril (PRINIVIL,ZESTRIL) 10 MG tablet Take 1 tablet (10 mg total) by mouth daily.  . metFORMIN (GLUCOPHAGE) 500 MG tablet Take 1 tablet (500 mg total) by mouth 2 (two) times daily with a meal.  . nitroGLYCERIN (NITROSTAT) 0.4 MG SL tablet Place 1 tablet (0.4 mg total) under the tongue every 5 (five) minutes as needed for chest pain.  . rosuvastatin (CRESTOR) 40 MG tablet Take 1 tablet (40 mg total) by mouth daily.  . traZODone (DESYREL) 50 MG tablet Take 0.5-1 tablets (25-50 mg total) by mouth at bedtime as needed for sleep.   No facility-administered encounter medications on file as of 06/28/2017.     Functional Status:   In your present state of health, do you have any difficulty performing the following activities: 05/15/2017 05/09/2017  Hearing? - Y  Comment - both ears  Vision? - N  Difficulty concentrating or making decisions? - Y  Comment - -  Walking or climbing stairs? - Y  Comment - -  Dressing or bathing? - N  Doing  errands, shopping? Y (No Data)  Comment - does not drive due to vision  Preparing Food and eating ? - Y  Comment - signifcant other prepares food  Using the Toilet? - N  In the past six months, have you accidently leaked urine? - N  Do you have problems with loss of bowel control? - N  Managing your Medications? - Y  Comment - stopped taking medications due blood in stool. reports medications are expensive.   Has medicare  Managing your Finances? - Y  Housekeeping or managing your Housekeeping? - N  Some recent data might be hidden    Fall/Depression Screening:    Fall Risk   05/18/2017 05/09/2017 11/25/2016  Falls in the past year? Yes Yes No  Number falls in past yr: 1 1 -  Injury with Fall? No No -  Follow up - Falls prevention discussed -   PHQ 2/9 Scores 05/18/2017 05/09/2017 11/25/2016 10/18/2016 10/11/2016 10/07/2015 04/03/2015  PHQ - 2 Score 1 0 0 0 0 0 0    Assessment:   (1)  Patient reports that he is taking his medications as prescribed.  States he is feeling well. Normal bowels. (2) CBG range as above (3) BP range as above. (4) translator present for MD visit yesterday  Plan:  (1) encouraged patient to continue to take medications as prescribed.  (2) Encouraged patient to continue to self monitor. (3) encouraged patient to continue to self monitor BP and take medications as prescribed.  (4) encouraged patient to continue to insist on a translator. Reviewed process of requesting a translator every time.    Next visit planned 07/11/2017 at 10 am.  [facility] CM Care Plan Problem One     Most Recent Value  Care Plan Problem One  Recent admission for chest pain  Role Documenting the Problem One  Care Management [place] for Problem One  Active  [facility] Long Term Goal   Patient will report no readmission in the next 60 days.   THN Long Term Goal Start Date  05/09/17  Interventions for Problem One Long Term Goal  Home visit completed  THN CM Short Term Goal #1   Paitent will monitor and record CBG daily for the necxt 30 days.   THN CM Short Term Goal #1 Start Date  06/22/17  Interventions for Short Term Goal #1  Encouraged patient to continue to monitor daily.  THN CM Short Term Goal #2   Patient will report weighing daily and recording for the next 30 days.   THN CM Short Term Goal #2 Start Date  06/22/17  Interventions for Short Term Goal #2  encouraged patient to continue to weigh daily.  THN CM Short Term Goal #3  Patient will report home visits by [facility] social worker and [facility] pharmay in the next 2 weeks.   THN CM Short Term Goal #3 Start Date   05/09/17  [facility] CM Short Term Goal #3 Met Date  05/18/17  Interventions for Short Tern Goal #3  Arranged in home appointments with [facility] team members.      [name], RN, BSN, CEN [facility] [employer] [phone]

## 2017-06-28 NOTE — Telephone Encounter (Signed)
New Message  Colene call requesting to speak with RN about pts current medication list. She would like to get clarity. Please call back to discuss

## 2017-06-28 NOTE — Telephone Encounter (Signed)
I s/w Colene with [employer]. Colene asked if we were stopping Victoza since it was taken off his medication list. I answered no. Colene states pt's insulin was stopped. I apologized that Briant Cedar, CMA probably got confused and deleted the wrong medication. Colene stated she will put the Victoza back on pt's chart. She did ask if pt could get a refill on Crestor 40 mg # 90 x 3 sent into [facility] on [address]. I have taken care of the refill for the pt. Colene thanked me for my call today.

## 2017-06-29 ENCOUNTER — Ambulatory Visit: Payer: Self-pay | Admitting: *Deleted

## 2017-07-11 ENCOUNTER — Other Ambulatory Visit: Payer: Self-pay

## 2017-07-11 ENCOUNTER — Ambulatory Visit: Payer: Self-pay | Admitting: *Deleted

## 2017-07-11 ENCOUNTER — Other Ambulatory Visit: Payer: Self-pay | Admitting: Pharmacist

## 2017-07-11 NOTE — Patient Outreach (Signed)
[place] [facility]) Care Management  07/11/2017  Ledarrius Kron 28-Dec-1950 [id number]  Home visit with Mr. Creamer at his residence with [place], [name], and interpretor.   Subjective: Mr. Emile reports he is taking his medications as prescribed.  He reports he needs to take his medication with food otherwise he feels tired.  He reports he is having regular bowel movements, eating vegetables, and walking daily.  He denies any chest pain.  He reports he picked up lancets and test strips from the pharmacy but that the pharmacy did not have his rosuvastatin ready.     Per patient's log book: Blood Pressure: 130-158 / 75-97 HR: 75-94 CBGs: 118-150 Weight: 150 stable  Objective:  BP: 162/84 HR 67 02 98:   Medications Reviewed Today    Reviewed by Rudean Haskell, RPH (Pharmacist) on 07/11/17 at 1009  Med List Status: <None>  Medication Order Taking? Sig Documenting Provider Last Dose Status Informant  amLODipine (NORVASC) 5 MG tablet [id number] Yes Take 1 tablet (5 mg total) by mouth daily. Copland, Gay Filler, MD Taking Active   aspirin EC 81 MG EC tablet [id number] Yes Take 1 tablet (81 mg total) by mouth daily. Geradine Girt, DO Taking Active   gabapentin (NEURONTIN) 300 MG capsule [id number] Yes TAKE 1 (ONE) CAPSULE BY MOUTH THREE TIMES DAILY AND TWO CAPSULE BY MOUTH ONCE DAILY AT NIGHT AS NEEDED FOR LEG PAIN [provider] Taking Active   glucose monitoring kit (FREESTYLE) monitoring kit [id number] Yes 1 each by Does not apply route 4 (four) times daily - after meals and at bedtime. 1 month Diabetic Testing Supplies for QAC-QHS accuchecks. Jonetta Osgood, MD Taking Active Family Member  Insulin Pen Needle 32G X 8 MM MISC [id number] Yes novotwist 32x72m  Pen needles Use once as directed daily Copland, JGay Filler MD Taking Active Family Member  liraglutide 18 MG/3ML SOPN [id number] Inject 1.2 mg into the skin daily. [provider] Taking Active Self  lisinopril  (PRINIVIL,ZESTRIL) 10 MG tablet [id number] Take 1 tablet (10 mg total) by mouth daily. WRichardson DoppT, PA-C Taking Active Family Member  metFORMIN (GLUCOPHAGE) 500 MG tablet [id number] Take 1 tablet (500 mg total) by mouth 2 (two) times daily with a meal. Copland, JGay Filler MD Taking Active   nitroGLYCERIN (NITROSTAT) 0.4 MG SL tablet [id number] Place 1 tablet (0.4 mg total) under the tongue every 5 (five) minutes as needed for chest pain. GWendie Agreste MD Taking Active Family Member  rosuvastatin (CRESTOR) 40 MG tablet [id number] Take 1 tablet (40 mg total) by mouth daily. WRichardson DoppT, PA-C Taking Active   traZODone (DESYREL) 50 MG tablet [id number] Take 0.5-1 tablets (25-50 mg total) by mouth at bedtime as needed for sleep. Copland, JGay Filler MD Taking Active          Assessment: Patient appears compliant with his medications however he is not taking his blood pressure at a consistent time every day which can cause doses to be too close together or too far apart.  Patient was counseled to take his blood pressure medications, amlodipine and lisinopril, every morning around 10AM with food.  Mr. Whitford voiced understanding.  His rosuvastatin bottle was marked to take in the evening.   New prescription for rosuvastatin 480m 90 day supply, was called into pharmacy on 06/28/17 and put on hold.  Pharmacy will fill prescription today for patient.  Pen needles will be ready to fill on insurance  on 07/15/17.  Patient voiced understanding through interpretor.   Patient received a new 2019 calendar book from [place].  Appointments for cardiology and PCP in November were reviewed with patient.   Patient updated on Medicare status from message from [facility] LCSW.   Plan: I will plan another home visit with Mr. Pursel on November 6th, 2018 @ 10:00AM.  I will request an interpretor to be present.    [name], PharmD, [place] [phone]

## 2017-07-11 NOTE — Patient Outreach (Signed)
[facility]) Care Management   07/11/2017  Ostin Bultema 1951/04/22 [id number]  Hollis Feldpausch is an 66 y.o. male Joint visit with [facility] pharmacist. Translator present.  Subjective:  Patient reports that he is doing well. Reports normal bowel movements. Reports that he continues to take his medications as prescribed.   Keep log.  CBG range of 102-187..  Todays reading of 129. BP range of 128-162/75-97.  Weight stable at 150.  Denies any new problems or concerns today.  Reports exercising 3 times per week.  Objective:   Vitals:   07/11/17 1014  BP: (!) 162/84  Pulse: 67  Resp: 18  SpO2: 98%  Weight: 150 lb (68 kg)   Review of Systems  Constitutional:       Feels tired today  HENT: Negative.   Eyes: Negative.   Respiratory: Negative.   Cardiovascular: Negative.   Gastrointestinal:       Reports normal bowel movements  Genitourinary: Negative.   Musculoskeletal: Negative.   Skin: Negative.   Neurological: Negative.   Endo/Heme/Allergies: Negative.   Psychiatric/Behavioral: Negative.     Physical Exam  Constitutional: He is oriented to person, place, and time. He appears well-developed and well-nourished.  Cardiovascular: Normal rate, normal heart sounds and intact distal pulses.   Respiratory: Effort normal and breath sounds normal.  Lungs clear  GI: Soft. Bowel sounds are normal.  Musculoskeletal: Normal range of motion. He exhibits no edema.  Neurological: He is alert and oriented to person, place, and time.  Per translator  Skin: Skin is warm and dry.  Feet without lesions or sores  Psychiatric: He has a normal mood and affect. His behavior is normal. Judgment and thought content normal.    Encounter Medications:   Outpatient Encounter Prescriptions as of 07/11/2017  Medication Sig  . amLODipine (NORVASC) 5 MG tablet Take 1 tablet (5 mg total) by mouth daily.  Marland Kitchen aspirin EC 81 MG EC tablet Take 1 tablet (81 mg total) by mouth daily.  Marland Kitchen gabapentin  (NEURONTIN) 300 MG capsule TAKE 1 (ONE) CAPSULE BY MOUTH THREE TIMES DAILY AND TWO CAPSULE BY MOUTH ONCE DAILY AT NIGHT AS NEEDED FOR LEG PAIN  . glucose monitoring kit (FREESTYLE) monitoring kit 1 each by Does not apply route 4 (four) times daily - after meals and at bedtime. 1 month Diabetic Testing Supplies for QAC-QHS accuchecks.  . Insulin Pen Needle 32G X 8 MM MISC novotwist 32x20m  Pen needles Use once as directed daily  . liraglutide 18 MG/3ML SOPN Inject 1.2 mg into the skin daily.  .Marland Kitchenlisinopril (PRINIVIL,ZESTRIL) 10 MG tablet Take 1 tablet (10 mg total) by mouth daily.  . metFORMIN (GLUCOPHAGE) 500 MG tablet Take 1 tablet (500 mg total) by mouth 2 (two) times daily with a meal.  . nitroGLYCERIN (NITROSTAT) 0.4 MG SL tablet Place 1 tablet (0.4 mg total) under the tongue every 5 (five) minutes as needed for chest pain.  . rosuvastatin (CRESTOR) 40 MG tablet Take 1 tablet (40 mg total) by mouth daily.  . traZODone (DESYREL) 50 MG tablet Take 0.5-1 tablets (25-50 mg total) by mouth at bedtime as needed for sleep.   No facility-administered encounter medications on file as of 07/11/2017.     Functional Status:   In your present state of health, do you have any difficulty performing the following activities: 05/15/2017 05/09/2017  Hearing? - Y  Comment - both ears  Vision? - N  Difficulty concentrating or making decisions? - Y  Comment - -  Walking or climbing stairs? - Y  Comment - -  Dressing or bathing? - N  Doing errands, shopping? Y (No Data)  Comment - does not drive due to vision  Preparing Food and eating ? - Y  Comment - signifcant other prepares food  Using the Toilet? - N  In the past six months, have you accidently leaked urine? - N  Do you have problems with loss of bowel control? - N  Managing your Medications? - Y  Comment - stopped taking medications due blood in stool. reports medications are expensive.   Has medicare  Managing your Finances? - Y  Housekeeping or  managing your Housekeeping? - N  Some recent data might be hidden    Fall/Depression Screening:    Fall Risk  06/28/2017 05/18/2017 05/09/2017  Falls in the past year? Yes Yes Yes  Comment no falls in the  last month - -  Number falls in past yr: 1 1 1   Injury with Fall? No No No  Follow up - - Falls prevention discussed   PHQ 2/9 Scores 05/18/2017 05/09/2017 11/25/2016 10/18/2016 10/11/2016 10/07/2015 04/03/2015  PHQ - 2 Score 1 0 0 0 0 0 0    Assessment:   (1) BP range is higher.  Has not yet taking his medications today. Patient is randomly taking BP meds some in the morning and some at night. (2) CBG range is good. (3) medicaid pending.  (4) no chest pain.  (5) reviewed pending appointments. Plan:  (1) Pharmacist reviewed with patient how to make medications. Pill bottles marked on which meds to take in the mornings and which medications to taking in the eveenings. Patient will continue to monitor BP daily.  (2) Encouraged patient to continue to monitor CBG and take medications as prescribed. (3) patient informed [facility] Social worker will follow up. (4) reviewed with patient to call MD for chest pain or shortness of breath. (5) encouraged patient to ask for an in person interpeter at all MD visits.   Will plan to close case to nursing as nursing needs are met.  Patient will remain open to [facility] social worker and [facility] pharmacist. ..  Will send an update to MD.  [facility] CM Care Plan Problem One     Most Recent Value  Care Plan Problem One  Recent admission for chest pain  Role Documenting the Problem One  Care Management [place] for Problem One  Active  [facility] Long Term Goal   Patient will report no readmission in the next 60 days.   THN Long Term Goal Start Date  05/09/17  THN Long Term Goal Met Date  07/11/17  Interventions for Problem One Long Term Goal  Home visit completed  THN CM Short Term Goal #1   Paitent will monitor and record CBG daily for the necxt 30 days.   THN CM  Short Term Goal #1 Start Date  06/22/17  [facility] CM Short Term Goal #1 Met Date  07/11/17  Interventions for Short Term Goal #1  Encouraged patient to continue to monitor daily.  THN CM Short Term Goal #2   Patient will report weighing daily and recording for the next 30 days.   THN CM Short Term Goal #2 Start Date  06/22/17  [facility] CM Short Term Goal #2 Met Date  07/11/17  Interventions for Short Term Goal #2  encouraged patient to continue to weigh daily.  THN CM Short Term Goal #3  Patient will report  home visits by [facility] social worker and [facility] pharmay in the next 2 weeks.   THN CM Short Term Goal #3 Start Date  05/09/17  [facility] CM Short Term Goal #3 Met Date  05/18/17  Interventions for Short Tern Goal #3  Arranged in home appointments with [facility] team members.       [name], RN, BSN, CEN [facility] [employer] [phone]

## 2017-07-20 ENCOUNTER — Ambulatory Visit: Payer: Self-pay | Admitting: *Deleted

## 2017-07-26 ENCOUNTER — Ambulatory Visit: Payer: Self-pay | Admitting: *Deleted

## 2017-07-27 ENCOUNTER — Other Ambulatory Visit: Payer: Medicare Other

## 2017-07-28 ENCOUNTER — Other Ambulatory Visit: Payer: Medicare Other

## 2017-07-28 ENCOUNTER — Encounter: Payer: Self-pay | Admitting: *Deleted

## 2017-07-28 ENCOUNTER — Other Ambulatory Visit: Payer: Medicare Other | Admitting: *Deleted

## 2017-07-28 ENCOUNTER — Ambulatory Visit: Payer: Self-pay | Admitting: *Deleted

## 2017-07-28 ENCOUNTER — Telehealth: Payer: Self-pay | Admitting: *Deleted

## 2017-07-28 DIAGNOSIS — I2510 Atherosclerotic heart disease of native coronary artery without angina pectoris: Secondary | ICD-10-CM

## 2017-07-28 DIAGNOSIS — E785 Hyperlipidemia, unspecified: Secondary | ICD-10-CM | POA: Diagnosis not present

## 2017-07-28 LAB — LIPID PANEL
Chol/HDL Ratio: 5.4 ratio — ABNORMAL HIGH (ref 0.0–5.0)
Cholesterol, Total: 226 mg/dL — ABNORMAL HIGH (ref 100–199)
HDL: 42 mg/dL (ref >39)
LDL Calculated: 143 mg/dL — ABNORMAL HIGH (ref 0–99)
Triglycerides: 203 mg/dL — ABNORMAL HIGH (ref 0–149)
VLDL Cholesterol Cal: 41 mg/dL — ABNORMAL HIGH (ref 5–40)

## 2017-07-28 NOTE — Telephone Encounter (Signed)
-----   Message from Liliane Shi, [place] sent at 07/28/2017  4:59 PM EDT ----- Please call the patient Lipids uncontrolled.  LDL should be less than 70.   #If he is taking Crestor 40 mg every day >> refer to lipid clinic #If not taking Crestor 40 mg every day >> resume Crestor 40 mg daily and recheck lipids in 8 weeks. Richardson Dopp, PA-C 07/28/2017 4:59 PM

## 2017-07-28 NOTE — Telephone Encounter (Signed)
DPR ok to s/w pt's son Pros. Pt son has been notified of lab results/findings by phone with verbal understanding. I asked pt was taking the Crestor 40 mg every day, son answered yes. I then advised based on that answer of the recommendation to have pt see the Lipid Clinic. I advised pt will have [facility] call him next week for an appt for pt with an appt to the Lipid Clinic. [name] thanked me for the call.

## 2017-07-28 NOTE — Telephone Encounter (Signed)
OPENED IN ERROR  This encounter was created in error - please disregard.

## 2017-07-31 ENCOUNTER — Ambulatory Visit: Payer: Self-pay | Admitting: *Deleted

## 2017-08-01 ENCOUNTER — Telehealth: Payer: Self-pay | Admitting: Physician Assistant

## 2017-08-01 ENCOUNTER — Other Ambulatory Visit: Payer: Self-pay | Admitting: Pharmacist

## 2017-08-01 NOTE — Telephone Encounter (Signed)
I s/w Colleen with [facility] in regards to lab appt for the pt to be made. Colleen and I spoke that pt had not taken Crestor for about 2 weeks due to he ran out. Crestor was called in by [facility] 10/16 though pt did not pick up Rx. Jaclyn Shaggy has re-sent Crestor 40 mg daily and pt will pick up today. While speaking with Jaclyn Shaggy today she was at the pt's house [place] we were able to confirm with the pt he was picking up Crestor today. Per my conversation with Jaclyn Shaggy we agreed to hold off on Lipid Clinic referral and let's get repeat lab work to be done in 6-8 weeks. Pt has confirmed today that he will be able to come in 09/29/17 for FLP/LFT. I advised Colleen from [facility] that I will advise Richardson Dopp, [facility] of plan of care and if there are any new recommenddtions I will let her know. Jaclyn Shaggy thanked me for my time and my help in this matter.

## 2017-08-01 NOTE — Telephone Encounter (Signed)
Agree  Richardson Dopp, PA-C    08/01/2017 2:17 PM

## 2017-08-01 NOTE — Patient Outreach (Signed)
[facility] [facility]) Care Management  08/01/2017  Herbert Lanphier Nov 06, 1950 [id number]  Subjective:  Successful home visit with Mr. Buday, interpretor, and significant other.  HIPAA identifiers verified. Mr. Bills reports he is taking his medications as prescribed  however he did not pick up his rosuvastatin refill after our last visit on 07/11/2017.  He reports he has been out of this medication for several days and has not contacted his pharmacy to fill it again for him.  He reports he feels tired for ~1-2 hours each morning and evening after taking his medications.  He confirms he is taking his blood pressure medications in the morning consistently rather than alternating morning and evening.    Per patient's log book of the last week:  BPs 101-130/80-90s HR 60-70s CBGs: 108-150  Objective: Lipid panel 11/2: TC = 226 LDL = 143 HDL = 42 TGs = 203  Medications Reviewed Today    Reviewed by Rudean Haskell, RPH (Pharmacist) on 08/01/17 at [place] List Status: <None>  Medication Order Taking? Sig Documenting Provider Last Dose Status Informant  amLODipine (NORVASC) 5 MG tablet [id number] Yes Take 1 tablet (5 mg total) by mouth daily. Copland, Gay Filler, MD Taking Active   aspirin EC 81 MG EC tablet [id number] Yes Take 1 tablet (81 mg total) by mouth daily. Geradine Girt, DO Taking Active   gabapentin (NEURONTIN) 300 MG capsule [id number] Yes TAKE 1 (ONE) CAPSULE BY MOUTH THREE TIMES DAILY AND TWO CAPSULE BY MOUTH ONCE DAILY AT NIGHT AS NEEDED FOR LEG PAIN [provider] Taking Active   glucose monitoring kit (FREESTYLE) monitoring kit [id number] Yes 1 each by Does not apply route 4 (four) times daily - after meals and at bedtime. 1 month Diabetic Testing Supplies for QAC-QHS accuchecks. Jonetta Osgood, MD Taking Active Family Member  Insulin Pen Needle 32G X 8 MM MISC [id number] Yes novotwist 32x32m  Pen needles Use once as directed daily Copland, JGay Filler MD Taking Active  Family Member  liraglutide 18 MG/3ML SOPN [id number] Inject 1.2 mg into the skin daily. [provider] Taking Active Self  lisinopril (PRINIVIL,ZESTRIL) 10 MG tablet [id number] Take 1 tablet (10 mg total) by mouth daily. WRichardson DoppT, PA-C Taking Active Family Member  metFORMIN (GLUCOPHAGE) 500 MG tablet [id number] Take 1 tablet (500 mg total) by mouth 2 (two) times daily with a meal. Copland, JGay Filler MD Taking Active   nitroGLYCERIN (NITROSTAT) 0.4 MG SL tablet [id number] Place 1 tablet (0.4 mg total) under the tongue every 5 (five) minutes as needed for chest pain. GWendie Agreste MD Taking Active Family Member  rosuvastatin (CRESTOR) 40 MG tablet [id number] Take 1 tablet (40 mg total) by mouth daily. WRichardson DoppT, PA-C Taking Active   traZODone (DESYREL) 50 MG tablet [id number] Take 0.5-1 tablets (25-50 mg total) by mouth at bedtime as needed for sleep. Copland, JGay Filler MD Taking Active           Assessment:  Medication adherence: Mr. Ames has improved his medication adherence with oral antihypertensives however has been out of his rosuvastatin for ~2 weeks.  I reviewed with Mr. Benefiel how to call in a refill using the automated phone system however he was not able to type in the refill number fast enough for the prompt system.  He prefers to take his bottles to the pharmacy for refills.  It is unclear why Mr. Kuc did not pick up his rosuvastatin  after our last visit.   Noted Mr. Lines had lab work drawn on 11/2 for a lipid panel with elevated values however these results are likely inaccurate as he was not taking his rosuvastatin for several days prior.    I left a message with Mr. Dacus cardiology office to update them on the rosuvastatin.  I received a call back from [place], [place], for [employer]  Plan for patient is to re-draw lipid panel in 8 weeks on Jan 4th, 2019 after he has been taking rosuvastatin consistenly.  I wrote down this appointment in Mr. Obey  calendar and reviewed that he should not eat that day until after his blood work is drawn.  Mr. Silbaugh voiced understanding.   I called in another refill of 90 day supply of rosuvastatin for Mr. Medel.  It will be ready for pick-up today.  Mr. Sabino voiced understanding.  Lisinopril, amlodipine, and metformin can all be filled for 90 day supply on 08/16/2017.  I wrote this date down in Mr. Nodal calendar to remind him to bring his bottles to the pharmacy on this day for refills.  He voiced understanding.   Plan: I will follow-up with Mr. Teeple in 3 months to assist with medication refills as needed.    [name], PharmD, [place] [phone]

## 2017-08-01 NOTE — Telephone Encounter (Signed)
New message     Colleen with [facility], the patient is out of his crestor , has not taken 2 weeks ago, and his lipid panel was high when the blood work was drawn , please schedule redraw for in about 8 weeks

## 2017-08-03 ENCOUNTER — Telehealth: Payer: Self-pay | Admitting: Pharmacist

## 2017-08-03 ENCOUNTER — Ambulatory Visit: Payer: Self-pay | Admitting: *Deleted

## 2017-08-03 NOTE — Patient Outreach (Signed)
[place] [facility]) Care Management  08/03/2017  Barry [name] May 23, 1951 [id number]  Care coordination call placed to [name].  Confirmed patient picked up prescription for rosuvastatin on 11/6 however it was filled only for 30 days instead of 90 days.  Pharmacist, Artis Flock, deactivated this prescription [name] only rosuvastatin 90 day supply is available.  Patient also should be due for refill on Victoza soon.    Plan: I will call pharmacy in 3 weeks for correct rosuvastatin prescription to be filled.  I will follow-up with patient's son telephonically to remind Barry [name] to refill rosuvastatin prescription and Victoza if this has not been refilled already.    [name], PharmD, [place] [phone]

## 2017-08-08 NOTE — Progress Notes (Signed)
[place] at [facility] [address] [place], [place] [address] [phone] [id number]  Date:  08/09/2017   Name:  Barry [name]   DOB:  April 30, 1951   MRN:  [id number]  PCP:  Darreld Mclean, MD    Chief Complaint: Diabetes   History of Present Illness:  Barry [name] is a 66 y.o. very pleasant male patient who presents with the following:  Follow-up visit today- I last saw him in September  Here today to discuss a few concerns He declines flu and pneumonia vaccines today He has felt tired- will check CBC and TSH However he has not been sleeping well- will have him try a low dose of trazodone at bedtime He is on minimal insulin, and is not using it every day.  Will have him stop insulin and go back on metformin. Start with 500 mg once a day Continue victoza  Will check BNP due to history of diastolic CHF and weight gain, although per pt he is losing weight His son helps with communication today Recheck here in 2 months   Lab Results  Component Value Date   HGBA1C 7.4 (H) 08/09/2017   He is accompanied by his wife and a professional interpreter today No recent eye exam- he would like a referral which I will place for him, in [place]  He notes that his blood sugar does go up and down and he is not sure how well his DM is controlled- we will get an A1c for him today He feels that the trazodone is working for him No SOB, no orthopnea, no swelling of his feet or legs His weight is up, but as above he does not have any other sign or symptom of CHF exacerbation  Wt Readings from Last 3 Encounters:  08/09/17 167 lb 8 oz (76 kg)  07/11/17 150 lb (68 kg)  06/28/17 150 lb (68 kg)   Due for prevnar but he declines today Also declines flu shot today  He was started on crestor for dyslipidemia not long ago- he seems to be tolerating this ok No other concerns today Patient Active Problem List   Diagnosis Date Noted  . Chest pain, rule out  acute myocardial infarction 05/03/2017  . Atherosclerotic peripheral vascular disease with ulceration ([place]) 04/03/2015  . Special screening for malignant neoplasms, colon 05/07/2014  . Coronary artery disease involving native coronary artery of native heart without angina pectoris 03/28/2014  . Aneurysm of iliac artery ([place]) 03/18/2014  . Abdominal pain, unspecified site 03/18/2014  . Syncope in setting of nausea and vomiting 03/07/14 03/08/2014  . Non compliance with medical treatment 02/28/2014  . Dyslipidemia, goal LDL below 70 02/28/2014  . Bradycardia- beta blocker decreased 02/28/2014  . Chronic diastolic CHF (congestive heart failure) ([place]) 02/28/2014  . Type 2 diabetes mellitus with manifestations ([place]) 02/27/2014  . TIA (transient ischemic attack) 02/26/2014  . History of non-ST elevation myocardial infarction (NSTEMI) 02/26/2014  . Pain in limb-Abdomen  12/06/2013  . Weakness-Left arm / Lef 12/06/2013  . Mesenteric artery stenosis ([place]) 12/06/2013  . 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[place]) 12/03/2012  . Hypertension 11/10/2011  . History of CVA in adulthood 11/10/2011  . Language barrier 11/10/2011    Past Medical History:  Diagnosis Date  . CAD (coronary artery disease)    a. LHC (02/26/14):  mLAD 20 (faint L>R collats), mCFX 50, pRCA 95 (plaque rupture) >> PCI:  Xience DES to pRCA (severe tortuosity of R  innominate artery >> needs L radial or FA in future);  b. LHC (03/10/14):  CFX 90, oOM 70, pRCA stent patent >> PCI:  Xience DES to mCFX   . Carotid stenosis    a. Carotid US (02/27/14):  Bilateral ICA 1-39%  . Depression   . Diabetes mellitus   . 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HCC)    a. Mesenteric Artery Duplex (7/15):  pSMA chronic dissection with aneurysmal dilation of 1.29 cm (VVS);  b.  Chest CTA (02/26/14):  IMPRESSION:  1. No aortic dissection or other acute abnormality.  2. 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  3. Stable 17 mm ectasia of the left common  iliac artery.  4. Atherosclerosis, including aortoiliac and coronary artery disease.   . History of echocardiogram 09/2015   Echo 1/17: mod LVH, EF 55%, inf-lat HK, Gr 1 DD, mild LAE  . Hx of cardiovascular stress test    Lexiscan Myoview (2/16):  Inferior lateral defect c/w thinning vs small prior infarct, no ischemia, EF 49%, Low Risk  . Hx of echocardiogram    a.  Echocardiogram (02/27/14):  Mod focal basal and mild concentric LVH, EF 50-55%, no RWMA, Gr 1 DD, mild TR, normal RVF  . Hyperlipidemia   . Hypertension   . Iliac artery aneurysm, left (HCC)   . Myocardial infarction (HCC)   . Stroke (HCC) 09/26/2009    Past Surgical History:  Procedure Laterality Date  . CARDIAC CATHETERIZATION    . CORONARY ANGIOPLASTY      Social History   Tobacco Use  . Smoking status: Former Smoker    Packs/day: 0.25    Years: 38.00    Pack years: 9.50    Types: Cigarettes    Last attempt to quit: 2008    Years since quitting: 10.8  . Smokeless tobacco: Never Used  Substance Use Topics  . Alcohol use: No    Alcohol/week: 0.0 oz  . Drug use: No    Family History  Problem Relation Age of Onset  . Diabetes Father   . Hypertension Father   . Heart attack Father   . Stroke Neg Hx     No Known Allergies  Medication list has been reviewed and updated.  Current Outpatient Medications on File Prior to Visit  Medication Sig Dispense Refill  . amLODipine (NORVASC) 5 MG tablet Take 1 tablet (5 mg total) by mouth daily. 90 tablet 3  . aspirin EC 81 MG EC tablet Take 1 tablet (81 mg total) by mouth daily.    . gabapentin (NEURONTIN) 300 MG capsule TAKE 1 (ONE) CAPSULE BY MOUTH THREE TIMES DAILY AND TWO CAPSULE BY MOUTH ONCE DAILY AT NIGHT AS NEEDED FOR LEG PAIN    . glucose monitoring kit (FREESTYLE) monitoring kit 1 each by Does not apply route 4 (four) times daily - after meals and at bedtime. 1 month Diabetic Testing Supplies for QAC-QHS accuchecks. 1 each 1  . Insulin Pen Needle 32G X 8 MM  MISC novotwist 32x5mm  Pen needles Use once as directed daily 100 each 3  . liraglutide 18 MG/3ML SOPN Inject 1.2 mg into the skin daily.    . lisinopril (PRINIVIL,ZESTRIL) 10 MG tablet Take 1 tablet (10 mg total) by mouth daily. 30 tablet 11  . metFORMIN (GLUCOPHAGE) 500 MG tablet Take 1 tablet (500 mg total) by mouth 2 (two) times daily with a meal. 180 tablet 3  . nitroGLYCERIN (NITROSTAT) 0.4 MG SL tablet Place 1 tablet (0.4 mg total)   under the tongue every 5 (five) minutes as needed for chest pain. 25 tablet 1  . rosuvastatin (CRESTOR) 40 MG tablet Take 1 tablet (40 mg total) by mouth daily. 90 tablet 3  . traZODone (DESYREL) 50 MG tablet Take 0.5-1 tablets (25-50 mg total) by mouth at bedtime as needed for sleep. 30 tablet 3   No current facility-administered medications on file prior to visit.     Review of Systems:  As per HPI- otherwise negative. No fever or chills No rash, ST or cough Pt notes he is HOH in the right ear  Physical Examination: Vitals:   08/09/17 1151  BP: 116/62  Pulse: 64  Temp: 98.1 F (36.7 C)  SpO2: 98%   Vitals:   08/09/17 1151  Weight: 167 lb 8 oz (76 kg)   Body mass index is 28.75 kg/m. Ideal Body Weight:    GEN: WDWN, NAD, Non-toxic, A & O x 3, looks well HEENT: Atraumatic, Normocephalic. Neck supple. No masses, No LAD.  Bilateral TM wnl, oropharynx normal.  PEERL,EOMI.   Ears and Nose: No external deformity. CV: RRR, No M/G/R. No JVD. No thrill. No extra heart sounds. PULM: CTA B, no wheezes, crackles, rhonchi. No retractions. No resp. distress. No accessory muscle use. ABD: S, NT, ND, +BS. No rebound. No HSM. EXTR: No c/c/e NEURO Normal gait.  PSYCH: Normally interactive. Conversant. Not depressed or anxious appearing.  Calm demeanor.    Assessment and Plan: Type 2 diabetes mellitus with complication, without long-term current use of insulin (HCC) - Plan: Ambulatory referral to Ophthalmology, Hemoglobin A1c, Basic metabolic  panel  Chronic diastolic CHF (congestive heart failure) (HCC) - Plan: B Nat Peptide  Essential hypertension  Language barrier  Here today for a recheck visit Await A1c [place] we can gauge how well his DM is controlled BP is ok, continue current medications Referral to optho [place] he can get an eye exam His weight is up a good bit- will obtain a BNP today due to history of diastolic HF Caring for this pt can be a challenge due to language barrier- I have to communicate with his kids by phone most of the time.  A professional interpreter is here today which is very helpful  Signed Jessica Copland, MD  Received his labs  Results for orders placed or performed in visit on 08/09/17  Hemoglobin A1c  Result Value Ref Range   Hgb A1c MFr Bld 7.4 (H) 4.6 - 6.5 %  Basic metabolic panel  Result Value Ref Range   Sodium 138 135 - 145 mEq/L   Potassium 3.9 3.5 - 5.1 mEq/L   Chloride 97 96 - 112 mEq/L   CO2 33 (H) 19 - 32 mEq/L   Glucose, Bld 318 (H) 70 - 99 mg/dL   BUN 16 6 - 23 mg/dL   Creatinine, Ser 1.09 0.40 - 1.50 mg/dL   Calcium 9.7 8.4 - 10.5 mg/dL   GFR 71.60 >60.00 mL/min  B Nat Peptide  Result Value Ref Range   Pro B Natriuretic peptide (BNP) 28.0 0.0 - 100.0 pg/mL   Message to pt  

## 2017-08-09 ENCOUNTER — Ambulatory Visit (INDEPENDENT_AMBULATORY_CARE_PROVIDER_SITE_OTHER): Payer: Medicare Other | Admitting: Family Medicine

## 2017-08-09 ENCOUNTER — Ambulatory Visit: Payer: Medicare Other | Admitting: Family Medicine

## 2017-08-09 ENCOUNTER — Encounter: Payer: Self-pay | Admitting: Family Medicine

## 2017-08-09 VITALS — BP 116/62 | HR 64 | Temp 98.1°F | Wt 167.5 lb

## 2017-08-09 DIAGNOSIS — Z789 Other specified health status: Secondary | ICD-10-CM | POA: Diagnosis not present

## 2017-08-09 DIAGNOSIS — I5032 Chronic diastolic (congestive) heart failure: Secondary | ICD-10-CM | POA: Diagnosis not present

## 2017-08-09 DIAGNOSIS — E118 Type 2 diabetes mellitus with unspecified complications: Secondary | ICD-10-CM

## 2017-08-09 DIAGNOSIS — I200 Unstable angina: Secondary | ICD-10-CM | POA: Diagnosis not present

## 2017-08-09 DIAGNOSIS — I10 Essential (primary) hypertension: Secondary | ICD-10-CM

## 2017-08-09 LAB — HEMOGLOBIN A1C: Hgb A1c MFr Bld: 7.4 % — ABNORMAL HIGH (ref 4.6–6.5)

## 2017-08-09 LAB — BASIC METABOLIC PANEL
BUN: 16 mg/dL (ref 6–23)
CO2: 33 mEq/L — ABNORMAL HIGH (ref 19–32)
Calcium: 9.7 mg/dL (ref 8.4–10.5)
Chloride: 97 mEq/L (ref 96–112)
Creatinine, Ser: 1.09 mg/dL (ref 0.40–1.50)
GFR: 71.6 mL/min (ref >60.00)
Glucose, Bld: 318 mg/dL — ABNORMAL HIGH (ref 70–99)
Potassium: 3.9 mEq/L (ref 3.5–5.1)
Sodium: 138 mEq/L (ref 135–145)

## 2017-08-09 LAB — BRAIN NATRIURETIC PEPTIDE: Pro B Natriuretic peptide (BNP): 28 pg/mL (ref 0.0–100.0)

## 2017-08-09 NOTE — Patient Instructions (Addendum)
Please go to lab for your blood work and I will be in touch asap We will also set you up for an eye exam

## 2017-08-15 ENCOUNTER — Ambulatory Visit: Payer: Medicare Other

## 2017-08-15 NOTE — Progress Notes (Deleted)
Patient ID: Barry [name]                 DOB: 1951/07/21                    MRN: [id number]     HPI: Barry [name] is a 66 y.o. male patient referred to lipid clinic by Barry Dopp, PA. PMH is significant for CAD, chronic superior mesenteric artery dissection, prior stroke (2011), MI, DMT2, HTN, CKD, HLD. NSTEMI 6/15 with 95% proximal RCA stenosis treated with DES. Unstable angina later in 6/15, this time had DES to 90% mLCx. Echo (6/15) with EF 50-55%, moderate focal basal septal hypertrophy, normal RV size and systolic function. Lexiscan Cardiolite (2/16) with EF 49%, small fixed inferolateral defect with no ischemia.   Patient requires interpreter. Last note on 08/01/17 says they will hold off on Lipid Clinic referral as pt had not taken Crestor for 2 weeks before and they wanted to restart Crestor and recheck his lipid panel in 6-8 weeks. Adherence?  Current Medications: rosuvastatin 20m daily (since 12/24/14) Intolerances: atorvastatin 875mdaily, pravastatin 4027mnd 88m95mily Risk Factors: MI, DES x2 LDL goal: < 70 mg/dL  Diet:   Exercise:   Family History: DM, MI, HTN in father. No family history of stroke.  Social History: Former smoker, 0.25 PPD x 40 years. Quit in 2012. Denies alcohol or illicit drug use.  Labs: 07/28/17: TC 226, TG 203, HDL 42, LDL 143, VLDL 41, Chol/HDL Ratio 5.4 10/11/16: TC 241, TG 243, HDL 35, LDL 157, VLDL 49, Chol/HDL Ratio 6.9  Past Medical History:  Diagnosis Date  . CAD (coronary artery disease)    a. LHC (02/26/14):  mLAD 20 (faint L>R collats), mCFX 50, pRCA 95 (plaque rupture) >> PCI:  Xience DES to pRCA (severe tortuosity of R innominate artery >> needs L radial or FA in future);  b. LHC (03/10/14):  CFX 90, oOM 70, pRCA stent patent >> PCI:  Xience DES to mCFX   . Carotid stenosis    a. Carotid US ([place]/15):  Bilateral ICA 1-39%  . Depression   . Diabetes mellitus   . 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HCC)[place] a. Mesenteric Artery Duplex (7/15):  pSMA  chronic dissection with aneurysmal dilation of 1.29 cm (VVS);  b.  Chest CTA (02/26/14):  IMPRESSION:  1. No aortic dissection or other acute abnormality.  2. 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  3. Stable 17 mm ectasia of the left common iliac artery.  4. Atherosclerosis, including aortoiliac and coronary artery disease.   . HiMarland Kitchentory of echocardiogram 09/2015   Echo 1/17: mod LVH, EF 55%, inf-lat HK, Gr 1 DD, mild LAE  . Hx of cardiovascular stress test    Lexiscan Myoview (2/16):  Inferior lateral defect c/w thinning vs small prior infarct, no ischemia, EF 49%, Low Risk  . Hx of echocardiogram    a.  Echocardiogram (02/27/14):  Mod focal basal and mild concentric LVH, EF 50-55%, no RWMA, Gr 1 DD, mild TR, normal RVF  . Hyperlipidemia   . Hypertension   . Iliac artery aneurysm, left (HCC)[place]. Myocardial infarction (HCC)[place]. Stroke (HCC)[place]/09/2009    Current Outpatient Medications on File Prior to Visit  Medication Sig Dispense Refill  . amLODipine (NORVASC) 5 MG tablet Take 1 tablet (5 mg total) by mouth daily. 90 tablet 3  . aspirin EC 81 MG EC tablet Take 1 tablet (81 mg total) by  mouth daily.    Marland Kitchen gabapentin (NEURONTIN) 300 MG capsule TAKE 1 (ONE) CAPSULE BY MOUTH THREE TIMES DAILY AND TWO CAPSULE BY MOUTH ONCE DAILY AT NIGHT AS NEEDED FOR LEG PAIN    . glucose monitoring kit (FREESTYLE) monitoring kit 1 each by Does not apply route 4 (four) times daily - after meals and at bedtime. 1 month Diabetic Testing Supplies for QAC-QHS accuchecks. 1 each 1  . Insulin Pen Needle 32G X 8 MM MISC novotwist 32x66m  Pen needles Use once as directed daily 100 each 3  . liraglutide 18 MG/3ML SOPN Inject 1.2 mg into the skin daily.    .Marland Kitchenlisinopril (PRINIVIL,ZESTRIL) 10 MG tablet Take 1 tablet (10 mg total) by mouth daily. 30 tablet 11  . metFORMIN (GLUCOPHAGE) 500 MG tablet Take 1 tablet (500 mg total) by mouth 2 (two) times daily with a meal. 180 tablet 3  . nitroGLYCERIN (NITROSTAT) 0.4 MG  SL tablet Place 1 tablet (0.4 mg total) under the tongue every 5 (five) minutes as needed for chest pain. 25 tablet 1  . rosuvastatin (CRESTOR) 40 MG tablet Take 1 tablet (40 mg total) by mouth daily. 90 tablet 3  . traZODone (DESYREL) 50 MG tablet Take 0.5-1 tablets (25-50 mg total) by mouth at bedtime as needed for sleep. 30 tablet 3   No current facility-administered medications on file prior to visit.     No Known Allergies  Assessment/Plan:  1. Hyperlipidemia -

## 2017-08-24 ENCOUNTER — Ambulatory Visit: Payer: Self-pay | Admitting: Pharmacist

## 2017-08-24 ENCOUNTER — Other Ambulatory Visit: Payer: Self-pay | Admitting: Pharmacist

## 2017-08-24 ENCOUNTER — Other Ambulatory Visit: Payer: Self-pay | Admitting: *Deleted

## 2017-08-24 NOTE — Patient Outreach (Signed)
[place] [facility]) Care Management  08/24/2017  Barry [name] 1951-02-01 [id number]   Care coordination call placed to Mr. [facility] pharmacy, [place].  Barry [name] has not picked up his metformin, amlodipine, or lisinopril yet.  I requested that the pharmacy fill these prescriptions for 90 day supply.  I also requested refills on his rosuvastatin and liraglutide.  All medications will be ready for pick-up today.    Care coordination call placed to Barry [name] son, Barry [name], who speaks English and assists with language interpreting with Barry [name]. HIPAA identifiers verified.   I communicated to him that his father will have 5 medications to pick up at the pharmacy.  [name] states he will alert his father today to pick up these medications.    Plan: I will follow-up with [facility] next week to ensure medications have been picked-up.   Barry [name], PharmD, [place] [phone]

## 2017-08-28 ENCOUNTER — Ambulatory Visit: Payer: Self-pay | Admitting: Pharmacist

## 2017-08-28 ENCOUNTER — Other Ambulatory Visit: Payer: Self-pay | Admitting: Pharmacist

## 2017-08-28 NOTE — Patient Outreach (Signed)
[name]) Care Management  08/28/2017  Barry [name] 09-26-1951 [id number]  Care coordination call placed to [place].  Barry [name] picked up metformin, amlodipine, rosuvastatin, and liraglutide but did not pick up his lisinopril which is ready at the pharmacy.     Unsuccessful call placed to Barry [name] son, Barry [name].  I left a HIPAA compliant voicemail.  Unsuccessful call placed to Barry [name] daughter, Barry [name].  No voicemail set up to leave message.  Unsuccessful call placed to Barry [name].  I left a HIPAA compliant voicemail.   Plan: I will call Barry [name] and his family again tomorrow to follow-up on picking up his lisinopril.   [name], PharmD, [place] [phone]

## 2017-08-29 ENCOUNTER — Ambulatory Visit: Payer: Self-pay | Admitting: Pharmacist

## 2017-08-29 ENCOUNTER — Other Ambulatory Visit: Payer: Self-pay | Admitting: Pharmacist

## 2017-08-29 NOTE — Patient Outreach (Signed)
[place] [facility]) Care Management  08/29/2017  Barry [name] 04/15/1951 [id number]  Unsuccessful telephone call to Barry [name] son, [name] today.  I left a HIPPA compliant voicemail on the home phone.    Plan: I will follow-up with [name] this afternoon regarding Barry [name] medications.   Successful telephone call with Barry [name]. HIPAA identifiers verified. Barry [name] will contact his father and ensure that he picks up his lisinopril as soon as possible.   Plan: I will follow up later this week with [facility] to ensure prescription has been picked up.   Barry [name], PharmD, [place] [phone]

## 2017-08-31 ENCOUNTER — Ambulatory Visit: Payer: Self-pay | Admitting: *Deleted

## 2017-09-01 ENCOUNTER — Ambulatory Visit: Payer: Self-pay | Admitting: Pharmacist

## 2017-09-01 ENCOUNTER — Other Ambulatory Visit: Payer: Self-pay | Admitting: Pharmacist

## 2017-09-01 NOTE — Patient Outreach (Signed)
[place] [facility]) Care Management  09/01/2017  Aayden Ferraz 1951/03/02 [id number]  Care coordination call placed to [place].  Mr. Pua picked up his lisinopril yesterday, December 6th.  He should have all prescribed medications at home.    Plan: I will follow-up with Mr. Eves in February as planned.   [name], PharmD, [place] [phone]

## 2017-09-12 ENCOUNTER — Other Ambulatory Visit: Payer: Self-pay | Admitting: *Deleted

## 2017-09-12 NOTE — Patient Outreach (Signed)
[facility]) Care Management  09/12/2017  Fumio Melle November 20, 1950 [id number]   CSW has confirmed patient has partial Medicaid and is only eligible for this at this time. If patient has outstanding medical bills that he accumulates, CSW can assist and advise further.  CSW has shared above update with patient's grandson via phone and will close CSW  Referral at this time.   CSW will advise [facility] team and PCP of  CSW closure.   Eduard Clos, MSW, [place] Worker  [place] [phone]

## 2017-09-15 ENCOUNTER — Ambulatory Visit: Payer: Self-pay | Admitting: *Deleted

## 2017-09-16 ENCOUNTER — Other Ambulatory Visit: Payer: Self-pay | Admitting: Family Medicine

## 2017-09-16 DIAGNOSIS — E1140 Type 2 diabetes mellitus with diabetic neuropathy, unspecified: Secondary | ICD-10-CM

## 2017-09-16 DIAGNOSIS — Z794 Long term (current) use of insulin: Principal | ICD-10-CM

## 2017-09-29 ENCOUNTER — Other Ambulatory Visit: Payer: Medicare Other

## 2017-10-02 ENCOUNTER — Ambulatory Visit: Payer: Medicare Other

## 2017-10-02 NOTE — Progress Notes (Deleted)
Patient ID: Barry [name]                 DOB: Oct 21, 1950                    MRN: [id number]     HPI: Barry [name] is a 67 y.o. male patient of Dr. Saunders Revel, referred by Richardson Dopp, PA, that presents today for lipid evaluation.  PMH includes CAD, chronic superior mesenteric artery dissection, prior stroke, DM2, HTN, CKD, HL. He originally presented in 02/2014 with non-STEMI. LHC demonstrated a ruptured plaque and thrombus in the proximal RCA which was treated with a DES. Patient was previously seen by Richardson Dopp at which time his LDL was above goal. [name] reported that he had been off his Crestor for several weeks at that time. It was decided to resume Crestor and repeat a lipid panel in 8-12 weeks. The patient did not come for repeat blood work.  He presents today with interpretor.   Risk Factors: TIA, CVA, HTN, CAD, PVD LDL Goal: <70  Current Medications: crestor 71m daily Intolerances:   Diet:   Exercise:   Family History: Diabetes in his father; Heart attack in his father; Hypertension in his father. There is no history of Stroke.  Social History: Former smoker, denies alcohol use.   Labs: 07/28/17:  TC 226, TG 203, HDL 42, LDL 143 - Crestor 411mdaily - had been off for several weeks   Past Medical History:  Diagnosis Date  . CAD (coronary artery disease)    a. LHC (02/26/14):  mLAD 20 (faint L>R collats), mCFX 50, pRCA 95 (plaque rupture) >> PCI:  Xience DES to pRCA (severe tortuosity of R innominate artery >> needs L radial or FA in future);  b. LHC (03/10/14):  CFX 90, oOM 70, pRCA stent patent >> PCI:  Xience DES to mCFX   . Carotid stenosis    a. Carotid [place]):  Bilateral ICA 1-39%  . Depression   . Diabetes mellitus   . 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[facility]   a. Mesenteric Artery Duplex (7/15):  pSMA chronic dissection with aneurysmal dilation of 1.29 cm (VVS);  b.  Chest CTA (02/26/14):  IMPRESSION:  1. No aortic dissection or other acute abnormality.  2. Stable dissection  in the superior mesenteric artery.  3. Stable 17 mm ectasia of the left common iliac artery.  4. Atherosclerosis, including aortoiliac and coronary artery disease.   . Marland Kitchenistory of echocardiogram 09/2015   Echo 1/17: mod LVH, EF 55%, inf-lat HK, Gr 1 DD, mild LAE  . Hx of cardiovascular stress test    Lexiscan Myoview (2/16):  Inferior lateral defect c/w thinning vs small prior infarct, no ischemia, EF 49%, Low Risk  . Hx of echocardiogram    a.  Echocardiogram (02/27/14):  Mod focal basal and mild concentric LVH, EF 50-55%, no RWMA, Gr 1 DD, mild TR, normal RVF  . Hyperlipidemia   . Hypertension   . Iliac artery aneurysm, left ([facility]  . Myocardial infarction ([facility]  . Stroke ([facility]/09/2009    Current Outpatient Medications on File Prior to Visit  Medication Sig Dispense Refill  . amLODipine (NORVASC) 5 MG tablet Take 1 tablet (5 mg total) by mouth daily. 90 tablet 3  . aspirin EC 81 MG EC tablet Take 1 tablet (81 mg total) by mouth daily.    . Marland Kitchenabapentin (NEURONTIN) 300 MG capsule TAKE 1 (ONE) CAPSULE BY MOUTH THREE TIMES DAILY AND  TWO CAPSULE BY MOUTH ONCE DAILY AT NIGHT AS NEEDED FOR LEG PAIN    . glucose monitoring kit (FREESTYLE) monitoring kit 1 each by Does not apply route 4 (four) times daily - after meals and at bedtime. 1 month Diabetic Testing Supplies for QAC-QHS accuchecks. 1 each 1  . Insulin Pen Needle 32G X 8 MM MISC novotwist 32x25m  Pen needles Use once as directed daily 100 each 3  . liraglutide 18 MG/3ML SOPN Inject 1.2 mg into the skin daily.    .Marland Kitchenlisinopril (PRINIVIL,ZESTRIL) 10 MG tablet Take 1 tablet (10 mg total) by mouth daily. 30 tablet 11  . metFORMIN (GLUCOPHAGE) 500 MG tablet Take 1 tablet (500 mg total) by mouth 2 (two) times daily with a meal. 180 tablet 3  . nitroGLYCERIN (NITROSTAT) 0.4 MG SL tablet Place 1 tablet (0.4 mg total) under the tongue every 5 (five) minutes as needed for chest pain. 25 tablet 1  . rosuvastatin (CRESTOR) 40 MG tablet Take 1 tablet (40  mg total) by mouth daily. 90 tablet 3  . traZODone (DESYREL) 50 MG tablet Take 0.5-1 tablets (25-50 mg total) by mouth at bedtime as needed for sleep. 30 tablet 3   No current facility-administered medications on file prior to visit.     No Known Allergies  Assessment/Plan: Hyperlipidemia: repeat panel today??   Thank you,  KLelan Pons APatterson Hammersmith [facility]  10/02/2017 6:48 AM

## 2017-10-27 ENCOUNTER — Telehealth: Payer: Self-pay | Admitting: Emergency Medicine

## 2017-10-27 ENCOUNTER — Other Ambulatory Visit: Payer: Self-pay | Admitting: Pharmacist

## 2017-10-27 DIAGNOSIS — E785 Hyperlipidemia, unspecified: Secondary | ICD-10-CM

## 2017-10-27 NOTE — Telephone Encounter (Signed)
Ordered lipid panel Barry [name] and LMOM that this is done I am not sure if she needs me to do anything else at this time, let me know

## 2017-10-27 NOTE — Patient Outreach (Signed)
[facility]) Care Management  10/27/2017  Barry [name] 1951-04-25 [id number]   Subjective:  Successful home visit with Barry [name], girlfriend, and interpretor.  Patient reports he is walking daily and "feels good."  He reports he stopped checking his blood sugars and weights lately because of how good he feels.  He reports that he is taking his medication regularly and does not miss any doses.   Per review of CHL, patient missed cholesterol panel blood draw and dyslipidemia appointment in early January with cardiology office.  Patient reports he forgot about the appointment and relies solely on his girlfriend for transportation.     Objective:  During visit:  CBG 282 Blood pressure: 194/114 HR 63 Weight 155lb Hemoglobin A1C = 7.4 (Nov 2018)  Medications Reviewed Today    Reviewed by Barry Mclean, MD (Physician) on 08/09/17 at [place] List Status: <None>  Medication Order Taking? Sig Documenting Provider Last Dose Status Informant  amLODipine (NORVASC) 5 MG tablet [id number] Yes Take 1 tablet (5 mg total) by mouth daily. [name], Barry Filler, MD Taking Active   aspirin EC 81 MG EC tablet [id number] Yes Take 1 tablet (81 mg total) by mouth daily. Barry Girt, DO Taking Active   gabapentin (NEURONTIN) 300 MG capsule [id number] Yes TAKE 1 (ONE) CAPSULE BY MOUTH THREE TIMES DAILY AND TWO CAPSULE BY MOUTH ONCE DAILY AT NIGHT AS NEEDED FOR LEG PAIN [provider] Taking Active   glucose monitoring kit (FREESTYLE) monitoring kit [id number] Yes 1 each by Does not apply route 4 (four) times daily - after meals and at bedtime. 1 month Diabetic Testing Supplies for QAC-QHS accuchecks. Barry Osgood, MD Taking Active Family Member  Insulin Pen Needle 32G X 8 MM MISC [id number] Yes novotwist 32x62m  Pen needles Use once as directed daily [name], JGay Filler MD Taking Active Family Member  liraglutide 18 MG/3ML SOPN [id number] Inject 1.2 mg into the skin daily. [provider] Taking Active Self  lisinopril (PRINIVIL,ZESTRIL) 10 MG tablet [id number] Take 1 tablet (10 mg total) by mouth daily. WRichardson DoppT, PA-C Taking Active Family Member  metFORMIN (GLUCOPHAGE) 500 MG tablet [id number] Take 1 tablet (500 mg total) by mouth 2 (two) times daily with a meal. [name], JGay Filler MD Taking Active   nitroGLYCERIN (NITROSTAT) 0.4 MG SL tablet [id number] Place 1 tablet (0.4 mg total) under the tongue every 5 (five) minutes as needed for chest pain. GWendie Agreste MD Taking Active Family Member  rosuvastatin (CRESTOR) 40 MG tablet [id number] Take 1 tablet (40 mg total) by mouth daily. WRichardson DoppT, PA-C Taking Active   traZODone (DESYREL) 50 MG tablet [id number] Take 0.5-1 tablets (25-50 mg total) by mouth at bedtime as needed for sleep. [name], JGay Filler MD Taking Active           Assessment: Medication adherence:  Per review of medication bottles, patient has at least 4-6 weeks supply left of scheduled medications including oral medications and Victoza  He is out of PRN gabapentin which was prescribed 09/2016 by Urgent Care MD.   I recommended that he take the empty bottle with him to his appointment with Barry [name] this month to see if she recommends for him to continue on it.    Appointments:  We reviewed several upcoming appointments in February including PCP appointment on 11/15/2017 @ 9:30 AM and Vascular surgery appointments for CT scan.    I assisted patient  with rescheduling dyslipidemia appointment with clinical pharmacist on 11/16/2017 @ 10:30 AM.   Request made to Dr. Lorelei [name] for patient to have lipid panel drawn on 11/15/2017 prior to office appointment.   Transportation:  Limited transportation options for patient causing him to reschedule or miss appointments.  I reviewed Music therapist with patient who is agreeable to apply to program.  Call placed to [employer] to initiate application process.   Application will be mailed to patient and should arrive next week.   Other issues:  CBG elevated today during visit.  I encouraged patient to consistently check fasting CBG and before meals.   Elevated blood pressure today.  Patient states he drank 3 cups of coffee and had not taken his blood pressure medications yet.  He took medications during my visit.  I recommended that he decrease his coffee intake and take his blood pressure medications consistently each day.    Plan: I will follow-up with Barry [name] next Thursday for another home visit to follow-up on blood pressure, CBGs, and Barrister's clerk. I will bring a 2019 [facility] calendar for patient.   [name], PharmD, [place] [phone]

## 2017-10-27 NOTE — Telephone Encounter (Signed)
Copied from [place] [id number]. Topic: Quick Communication - See Telephone Encounter >> Oct 27, 2017 10:41 AM Oneta Rack wrote: CRM for notification. See Telephone encounter for:   10/27/17.  Caller name: Quintin Alto  Relation to pt: Clinical Pharmacist from [facility]  Call back [phone]   Reason for call:  Due to transportation being limited, [facility] requesting on Cardiology behalf lipid / cholesterol orders prior to  11/16/17 appointment with specialist, please advise

## 2017-11-02 ENCOUNTER — Other Ambulatory Visit: Payer: Self-pay | Admitting: Pharmacist

## 2017-11-02 NOTE — Patient Outreach (Signed)
[facility]) Care Management  11/02/2017  Barry [name] 1951-05-09 [id number]  Home visit with Barry [name], girlfriend, and interpreter today.   Subjective:  Patient reports he is feeling good today.  He complains of constipation and nausea with Victoza therefore he stopped taking it.  He reports his last dose was last Friday, Feb 1st.  He reports he takes it when his "sugars are high."  He reports he rechecked his blood pressure last week after our visit and it had improved.   Objective:  Blood pressure: 139/88 Heart Rate: 76 CBG: 130 this morning, 135 during visit Weight 155lb  Assessment: Transportation:   Barry [name] has several medical appointments later this month.  He relies on his girlfriend for transportation as he does not drive.  He does not currently have transportation for his CT appointment on Feb 19th.  He is agreeable to apply to [employer]. I assisted Barry [name] with completing application for [employer].  Call placed to schedule a driver however schedule if full through the rest of this month.  Call placed to reschedule CT appointment to 11/16/2017 at 8:30AM.  I brought Barry [name] a 2019 [facility] calendar and wrote down all his upcoming appointments.  We reviewed them several times including addresses and times for each.    Medication adherence:  Barry [name] reports 100% medication compliance with oral medications however has stopped taking his Victoza due to adverse side effects of nausea and constipation.  He reports his CBGs have stayed low even after stopping it.  Barry [name] has difficulty calling in refills for his oral medications due to language barrier.  He has at least 4-6 more weeks of medications before he will need refills.     Plan: I will route note to Dr. Lorelei Pont regarding vitals and Victoza.  I will follow-up with Barry [name] on March 11th, 2019 to review medication adherence and assist with calling in refills.    [name], PharmD, [place] [phone]

## 2017-11-07 ENCOUNTER — Other Ambulatory Visit: Payer: Self-pay

## 2017-11-07 ENCOUNTER — Encounter: Payer: Self-pay | Admitting: Family

## 2017-11-07 ENCOUNTER — Ambulatory Visit (INDEPENDENT_AMBULATORY_CARE_PROVIDER_SITE_OTHER): Payer: Medicare Other | Admitting: Family

## 2017-11-07 VITALS — BP 151/84 | HR 73 | Temp 98.0°F | Resp 16 | Ht 64.0 in | Wt 163.0 lb

## 2017-11-07 DIAGNOSIS — I723 Aneurysm of iliac artery: Secondary | ICD-10-CM

## 2017-11-07 DIAGNOSIS — I7779 Dissection of other artery: Secondary | ICD-10-CM

## 2017-11-07 NOTE — Progress Notes (Signed)
CC: Follow up left common iliac artery aneurysm and Disection of Mesenteric Artery    History of Present Illness  Barry [name] is a 67 y.o. (26-Oct-1950) male who presented to the emergency department in February, 2014 with abdominal pain. He underwent a CT scan which showed a superior mesenteric artery dissection. He was diagnosed as having episodic appendacitis involving the sigmoid colon. He was treated with pain medications and discharged. At that time Dr. Trula Slade requested that the patient followup with him with a repeat scan with better arterial phase timing.  He returns today for evaluation prior to CTA abd/pelvis. He has a Tour manager with him, his language is [place].    He had a stroke in 2011, he has residual mild weakness in left and and leg.  He has had low back pain since about 2011 with bilateral radiculopathy, left is worse.  He denies post prandial abdominal pain.   Dr. Trula Slade last evaluated pt on 11-22-16. At that time  Pt had non-flow-limiting dissection to the superior mesenteric artery with 14 mm diameter and 2.17 mm left common iliac aneurysm  The patient continued to do very well. He remained asymptomatic.  His CT scan findings were stable. Dr. Trula Slade recommended follow-up in 2 years with a repeat CT scan.  CTA abd/pelvis is scheduled for 11-16-17, is scheduled to see Dr. Trula Slade on 11-20-17.    Diabetic: yes, last A1C result on file was 7.4 on 08-09-17 Tobacco use: quit in 2008, smoked x 38 years  Past Medical History:  Diagnosis Date  . CAD (coronary artery disease)    a. LHC (02/26/14):  mLAD 20 (faint L>R collats), mCFX 50, pRCA 95 (plaque rupture) >> PCI:  Xience DES to pRCA (severe tortuosity of R innominate artery >> needs L radial or FA in future);  b. LHC (03/10/14):  CFX 90, oOM 70, pRCA stent patent >> PCI:  Xience DES to mCFX   . Carotid stenosis    a. Carotid US (02/27/14):  Bilateral ICA 1-39%  . Depression   . Diabetes mellitus   .  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[place])    a. Mesenteric Artery Duplex (7/15):  pSMA chronic dissection with aneurysmal dilation of 1.29 cm (VVS);  b.  Chest CTA (02/26/14):  IMPRESSION:  1. No aortic dissection or other acute abnormality.  2. 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  3. Stable 17 mm ectasia of the left common iliac artery.  4. Atherosclerosis, including aortoiliac and coronary artery disease.   Marland Kitchen History of echocardiogram 09/2015   Echo 1/17: mod LVH, EF 55%, inf-lat HK, Gr 1 DD, mild LAE  . Hx of cardiovascular stress test    Lexiscan Myoview (2/16):  Inferior lateral defect c/w thinning vs small prior infarct, no ischemia, EF 49%, Low Risk  . Hx of echocardiogram    a.  Echocardiogram (02/27/14):  Mod focal basal and mild concentric LVH, EF 50-55%, no RWMA, Gr 1 DD, mild TR, normal RVF  . Hyperlipidemia   . Hypertension   . Iliac artery aneurysm, left ([place])   . Myocardial infarction ([place])   . Stroke [facility]) 09/26/2009    Social History Social History   Tobacco Use  . Smoking status: Former Smoker    Packs/day: 0.25    Years: 38.00    Pack years: 9.50    Types: Cigarettes    Last attempt to quit: 2008    Years since quitting: 11.1  . Smokeless tobacco: Never Used  Substance Use  Topics  . Alcohol use: No    Alcohol/week: 0.0 oz  . Drug use: No    Family History Family History  Problem Relation Age of Onset  . Diabetes Father   . Hypertension Father   . Heart attack Father   . Stroke Neg Hx     Surgical History Past Surgical History:  Procedure Laterality Date  . CARDIAC CATHETERIZATION    . CARDIAC CATHETERIZATION N/A 06/25/2015   Procedure: Left Heart Cath and Coronary Angiography;  Surgeon: Burnell Blanks, MD;  Location: [place] CV LAB;  Service: Cardiovascular;  Laterality: N/A;  . COLONOSCOPY WITH PROPOFOL N/A 07/23/2015   Procedure: COLONOSCOPY WITH PROPOFOL;  Surgeon: [name], MD;  Location: [facility] ENDOSCOPY;  Service:  Endoscopy;  Laterality: N/A;  . CORONARY ANGIOPLASTY    . LEFT HEART CATHETERIZATION WITH CORONARY ANGIOGRAM N/A 02/26/2014   Procedure: LEFT HEART CATHETERIZATION WITH CORONARY ANGIOGRAM;  Surgeon: Wellington Hampshire, MD;  Location: [place] CATH LAB;  Service: Cardiovascular;  Laterality: N/A;  . LEFT HEART CATHETERIZATION WITH CORONARY ANGIOGRAM N/A 03/10/2014   Procedure: LEFT HEART CATHETERIZATION WITH CORONARY ANGIOGRAM;  Surgeon: Jettie Booze, MD;  Location: [facility] CATH LAB;  Service: Cardiovascular;  Laterality: N/A;    No Known Allergies  Current Outpatient Medications  Medication Sig Dispense Refill  . amLODipine (NORVASC) 5 MG tablet Take 1 tablet (5 mg total) by mouth daily. 90 tablet 3  . aspirin EC 81 MG EC tablet Take 1 tablet (81 mg total) by mouth daily.    Marland Kitchen lisinopril (PRINIVIL,ZESTRIL) 10 MG tablet Take 1 tablet (10 mg total) by mouth daily. 30 tablet 11  . metFORMIN (GLUCOPHAGE) 500 MG tablet Take 1 tablet (500 mg total) by mouth 2 (two) times daily with a meal. 180 tablet 3  . nitroGLYCERIN (NITROSTAT) 0.4 MG SL tablet Place 1 tablet (0.4 mg total) under the tongue every 5 (five) minutes as needed for chest pain. 25 tablet 1  . rosuvastatin (CRESTOR) 40 MG tablet Take 1 tablet (40 mg total) by mouth daily. 90 tablet 3  . traZODone (DESYREL) 50 MG tablet Take 0.5-1 tablets (25-50 mg total) by mouth at bedtime as needed for sleep. 30 tablet 3  . gabapentin (NEURONTIN) 300 MG capsule TAKE 1 (ONE) CAPSULE BY MOUTH THREE TIMES DAILY AND TWO CAPSULE BY MOUTH ONCE DAILY AT NIGHT AS NEEDED FOR LEG PAIN    . liraglutide 18 MG/3ML SOPN Inject 1.2 mg into the skin daily.     No current facility-administered medications for this visit.     ROS: see HPI for pertinent positives and negatives    Physical Examination  Vitals:   11/07/17 1333  BP: (!) 151/84  Pulse: 73  Resp: 16  Temp: 98 F (36.7 C)  TempSrc: Oral  SpO2: 95%  Weight: 163 lb (73.9 kg)  Height: 5\' 4"  (1.626 m)    Body mass index is 27.98 kg/m.  General: A&O x 3, WDWN HEENT: No gross abnormalities  Pulmonary: Sym exp, respirations are non labored, good air movement in all fields, CTAB, no rales, rhonchi, or wheezing. Cardiac: RRR, Nl S1, S2, no murmur detected.  Vascular: Vessel Right Left  Radial Palpable Palpable  Carotid without bruit without bruit  Aorta Not palpable N/A  Femoral Palpable Palpable  Popliteal  palpable palpable  PT Palpable Palpable  DP Palpable Palpable   Gastrointestinal: soft, - palpable masses, no abdominal tenderness on palpation. Musculoskeletal: M/S 5/5 in right arm and leg, 4/5 in left  arm and leg, Extremities without ischemic changes. Dermatologic: No rash, no cellulitis, no ulcers noted. Multiple small tattoos.  Neurologic: Pain and light touch intact in extremities, Motor exam as listed above. Psychiatric: Appropriate affect, thought content normal.     DATA  February 2017 CTA:  1. Stable non flow limiting dissection in the mid SMA, with 14 mm aneurysmal dilatation of the affected segment as before. 2. 17 mm fusiform left common iliac artery aneurysm, stable. 3. Cholelithiasis. 4. Right nephrolithiasis without hydronephrosis  Medical Decision Making  Barry [name] is a 67 y.o. male who presents with chronic superior mesenteric artery dissection and left common iliac artery penetrating ulcer.  He has no abdominal pain, no post prandial abdominal pain, weight remains stable.   CTA abd/pelvis is scheduled for 11-16-17, is scheduled to see Dr. Trula Slade on 11-20-17.     I discussed in depth with the patient the nature of atherosclerosis, and emphasized the importance of maximal medical management including strict control of blood pressure, blood glucose, and lipid levels, obtaining regular exercise, and cessation of smoking.    The patient is aware that without maximal medical management the underlying atherosclerotic disease  process will progress, limiting the benefit of any interventions. The patient is currently on a statin.   The patient is currently on an antiplatelet.   Thank you for allowing [place] to participate in this patient's care.  Clemon Chambers, RN, MSN, FNP-C [facility] [place] Office: [phone]  Clinic MD: Early  11/07/2017, 1:54 PM

## 2017-11-14 ENCOUNTER — Other Ambulatory Visit: Payer: Medicare Other

## 2017-11-14 NOTE — Progress Notes (Addendum)
[facility] [address], [place], [place] [address] [phone]  Date:  11/15/2017   Name:  Barry [name]   DOB:  14-Apr-1951   MRN:  [id number]  PCP:  Darreld Mclean, MD    Chief Complaint: Follow-up (Pt here for 3 month f/u visit. ) and Medication Refill (Pt request refill on Gabapentin. )   History of Present Illness:  Barry [name] is a 67 y.o. very pleasant male patient who presents with the following:  Periodic follow-up visit today History of CAD, CHF, DM, NSTEMI, dyslipidemia, HTN, language barrier, stroke, chronic superior mesenteric artery dissection and left common iliac artery penetrating ulcer Last admission was in August  He followed up with cardiology in October  1. Coronary artery disease involving native coronary artery of native heart without angina pectoris  S/p NSTEMI in 2015 treated with DES to the RCA and subsequent PCI to the LCx 2/2 [place]. Myoview in 2/16 was neg for ischemia. LHC in 05/2015 demonstrated patent stents to the RCA and LCx and otherwise stable anatomy compared to the films in 2015. He is treated medically. He had a recent admission to the hospital with chest pain. He ruled out for myocardial infarction. He denies any recurrent chest symptoms. Continue current therapy which includes aspirin, statin.   2. PAD (peripheral artery disease) (HCC) Penetrating ulcer left CIA and chronic SMA dissection. Last CTA abdomen with stable findings. He is followed by [facility]. Follow-up is planned in 10/2017. 3. Essential hypertension The patient's blood pressure is controlled on his current regimen.  Continue current therapy.   4. Dyslipidemia, goal LDL below 70  Continue statin. Arrange fasting lipids. 5. Snoring He admits to fatigue. He has been told that he snores. I have asked him to have his wife monitor his sleeping. If she witnesses apneic episodes, we will need to arrange a sleep study.  I last saw  him in November:  He is accompanied by his wife and a professional interpreter today No recent eye exam- he would like a referral which I will place for him, in [place] He notes that his blood sugar does go up and down and he is not sure how well his DM is controlled- we will get an A1c for him today He feels that the trazodone is working for him No SOB, no orthopnea, no swelling of his feet or legs His weight is up, but as above he does not have any other sign or symptom of CHF exacerbation     Wt Readings from Last 3 Encounters:  08/09/17 167 lb 8 oz (76 kg)  07/11/17 150 lb (68 kg)  06/28/17 150 lb (68 kg)   Due for prevnar but he declines today Also declines flu shot today He was started on crestor for dyslipidemia not long ago- he seems to be tolerating this ok  Lab Results  Component Value Date   HGBA1C 7.4 (H) 08/09/2017   He is being followed by vascular surgery- from their last note, earlier this month:  Medical Decision Making  Barry [name] is a 67 y.o. male who presents with chronic superior mesenteric artery dissection and left common iliac artery penetrating ulcer.  He has no abdominal pain, no post prandial abdominal pain, weight remains stable.  CTA abd/pelvis is scheduled for 11-16-17, is scheduled to see Dr. Trula Slade on 11-20-17.    I discussed in depth with the patient the nature of atherosclerosis, and emphasized the  importance of maximal medical management including strict control of blood pressure, blood glucose, and lipid levels, obtaining regular exercise, and cessation of smoking.    The patient is aware that without maximal medical management the underlying atherosclerotic disease process will progress, limiting the benefit of any interventions.  The patient is currently on a statin.    The patient is currently on an antiplatelet.   Thank you for allowing [place] to participate in this patient's care   Needs an A1c today He is taking his gabapentin just  once a day, at bedtime, as needed for leg pain  Will give a flu shot today He also needs prevnar- had pneumovax in 2015- will do today as well   Left shoulder concern- he has noted pain in this shoulder for a couple of years. He is not aware of any inciting injury however.   It hurts to lie on his left side   BP Readings from Last 3 Encounters:  11/15/17 (!) 154/82  11/07/17 (!) 151/84  08/09/17 116/62    He does not have an interpreter with him today, but is accompanied by his wife who is [name] to translate for him  Patient Active Problem List   Diagnosis Date Noted  . Chest pain, rule out acute myocardial infarction 05/03/2017  . Atherosclerotic peripheral vascular disease with ulceration ([place]) 04/03/2015  . Special screening for malignant neoplasms, colon 05/07/2014  . Coronary artery disease involving native coronary artery of native heart without angina pectoris 03/28/2014  . Aneurysm of iliac artery ([place]) 03/18/2014  . Abdominal pain, unspecified site 03/18/2014  . Syncope in setting of nausea and vomiting 03/07/14 03/08/2014  . Non compliance with medical treatment 02/28/2014  . Dyslipidemia, goal LDL below 70 02/28/2014  . Bradycardia- beta blocker decreased 02/28/2014  . Chronic diastolic CHF (congestive heart failure) ([place]) 02/28/2014  . Type 2 diabetes mellitus with manifestations ([place]) 02/27/2014  . TIA (transient ischemic attack) 02/26/2014  . History of non-ST elevation myocardial infarction (NSTEMI) 02/26/2014  . Pain in limb-Abdomen  12/06/2013  . Weakness-Left arm / Lef 12/06/2013  . Mesenteric artery stenosis ([place]) 12/06/2013  . 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[place]) 12/03/2012  . Hypertension 11/10/2011  . History of CVA in adulthood 11/10/2011  . Language barrier 11/10/2011    Past Medical History:  Diagnosis Date  . CAD (coronary artery disease)    a. LHC (02/26/14):  mLAD 20 (faint L>R collats), mCFX 50, pRCA 95 (plaque rupture) >> PCI:  Xience DES to pRCA  (severe tortuosity of R innominate artery >> needs L radial or FA in future);  b. LHC (03/10/14):  CFX 90, oOM 70, pRCA stent patent >> PCI:  Xience DES to mCFX   . Carotid stenosis    a. Carotid US (02/27/14):  Bilateral ICA 1-39%  . Depression   . Diabetes mellitus   . 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[place])    a. Mesenteric Artery Duplex (7/15):  pSMA chronic dissection with aneurysmal dilation of 1.29 cm ([facility]);  b.  Chest CTA (02/26/14):  IMPRESSION:  1. No aortic dissection or other acute abnormality.  2. 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  3. Stable 17 mm ectasia of the left common iliac artery.  4. Atherosclerosis, including aortoiliac and coronary artery disease.   Marland Kitchen History of echocardiogram 09/2015   Echo 1/17: mod LVH, EF 55%, inf-lat HK, Gr 1 DD, mild LAE  . Hx of cardiovascular stress test    Lexiscan Myoview (2/16):  Inferior lateral defect  c/w thinning vs small prior infarct, no ischemia, EF 49%, Low Risk  . Hx of echocardiogram    a.  Echocardiogram (02/27/14):  Mod focal basal and mild concentric LVH, EF 50-55%, no RWMA, Gr 1 DD, mild TR, normal RVF  . Hyperlipidemia   . Hypertension   . Iliac artery aneurysm, left ([place])   . Myocardial infarction ([place])   . Stroke [facility] [address]) 09/26/2009    Past Surgical History:  Procedure Laterality Date  . CARDIAC CATHETERIZATION    . CARDIAC CATHETERIZATION N/A 06/25/2015   Procedure: Left Heart Cath and Coronary Angiography;  Surgeon: Burnell Blanks, MD;  Location: [facility] CV LAB;  Service: Cardiovascular;  Laterality: N/A;  . COLONOSCOPY WITH PROPOFOL N/A 07/23/2015   Procedure: COLONOSCOPY WITH PROPOFOL;  Surgeon: [name], MD;  Location: [facility] ENDOSCOPY;  Service: Endoscopy;  Laterality: N/A;  . CORONARY ANGIOPLASTY    . LEFT HEART CATHETERIZATION WITH CORONARY ANGIOGRAM N/A 02/26/2014   Procedure: LEFT HEART CATHETERIZATION WITH CORONARY ANGIOGRAM;  Surgeon: [place] Hampshire, MD;  Location: [facility] CATH LAB;  Service:  Cardiovascular;  Laterality: N/A;  . LEFT HEART CATHETERIZATION WITH CORONARY ANGIOGRAM N/A 03/10/2014   Procedure: LEFT HEART CATHETERIZATION WITH CORONARY ANGIOGRAM;  Surgeon: Jettie Booze, MD;  Location: [facility] CATH LAB;  Service: Cardiovascular;  Laterality: N/A;    Social History   Tobacco Use  . Smoking status: Former Smoker    Packs/day: 0.25    Years: 38.00    Pack years: 9.50    Types: Cigarettes    Last attempt to quit: 2008    Years since quitting: 11.1  . Smokeless tobacco: Never Used  Substance Use Topics  . Alcohol use: No    Alcohol/week: 0.0 oz  . Drug use: No    Family History  Problem Relation Age of Onset  . Diabetes Father   . Hypertension Father   . Heart attack Father   . Stroke Neg Hx     No Known Allergies  Medication list has been reviewed and updated.  Current Outpatient Medications on File Prior to Visit  Medication Sig Dispense Refill  . amLODipine (NORVASC) 5 MG tablet Take 1 tablet (5 mg total) by mouth daily. 90 tablet 3  . aspirin EC 81 MG EC tablet Take 1 tablet (81 mg total) by mouth daily.    Marland Kitchen liraglutide 18 MG/3ML SOPN Inject 1.2 mg into the skin daily.    Marland Kitchen lisinopril (PRINIVIL,ZESTRIL) 10 MG tablet Take 1 tablet (10 mg total) by mouth daily. 30 tablet 11  . metFORMIN (GLUCOPHAGE) 500 MG tablet Take 1 tablet (500 mg total) by mouth 2 (two) times daily with a meal. 180 tablet 3  . nitroGLYCERIN (NITROSTAT) 0.4 MG SL tablet Place 1 tablet (0.4 mg total) under the tongue every 5 (five) minutes as needed for chest pain. 25 tablet 1  . rosuvastatin (CRESTOR) 40 MG tablet Take 1 tablet (40 mg total) by mouth daily. 90 tablet 3  . traZODone (DESYREL) 50 MG tablet Take 0.5-1 tablets (25-50 mg total) by mouth at bedtime as needed for sleep. 30 tablet 3   No current facility-administered medications on file prior to visit.     Review of Systems:  As per HPI- otherwise negative. No fever or chills   Physical Examination: Vitals:    11/15/17 0926 11/15/17 0931  BP: (!) 154/82 (!) 154/82  Pulse: 60   Temp: 98.1 F (36.7 C)   SpO2: 98%    Vitals:  11/15/17 0926  Weight: 164 lb 6.4 oz (74.6 kg)  Height: 5\' 4"  (1.626 m)   Body mass index is 28.22 kg/m. Ideal Body Weight: Weight in (lb) to have BMI = 25: 145.3  GEN: WDWN, NAD, Non-toxic, A & O x 3, looks well HEENT: Atraumatic, Normocephalic. Neck supple. No masses, No LAD.  Bilateral TM wnl, oropharynx normal.  PEERL,EOMI.   Ears and Nose: No external deformity. CV: RRR, No M/G/R. No JVD. No thrill. No extra heart sounds. PULM: CTA B, no wheezes, crackles, rhonchi. No retractions. No resp. distress. No accessory muscle use. ABD: S, NT, ND, +BS. No rebound. No HSM. EXTR: No c/c/e NEURO Normal gait.  PSYCH: Normally interactive. Conversant. Not depressed or anxious appearing.  Calm demeanor.  Left shoulder: he notes pain with full ROM of the shoulder- to flexion, abduction, internal and external rotation His ROM is about 90% normal Weakness with empty can testing on the left    Assessment and Plan: Type 2 diabetes mellitus with complication, without long-term current use of insulin ([place]) - Plan: Hemoglobin A1c, Hemoglobin A1c  Dyslipidemia  Essential hypertension  Language barrier  Chronic left shoulder pain - Plan: gabapentin (NEURONTIN) 300 MG capsule  Immunization due - Plan: Pneumococcal conjugate vaccine 13-valent IM  Need for influenza vaccination - Plan: Flu vaccine HIGH DOSE PF (Fluzone High Dose)  Await his labs today He declines to see sports med for his shoulder- he prefers to continue and observe this for now Updated his flu and pneumonia vaccines today Encouraged eye exam  Signed Lamar Blinks, MD   Results for orders placed or performed in visit on 11/15/17  Hemoglobin A1c  Result Value Ref Range   Hgb A1c MFr Bld 8.8 (H) 4.6 - 6.5 %   Called about his labs 3/5- I had been out of town and communication here is a challenge.    Will increase his metformin to 1000 BID Spoke with his son Pros about this plan.  They can have him take 2 of the 500 mg metformin until gone.  He states understanding. Will plan to have his dad see me in about 3 months

## 2017-11-15 ENCOUNTER — Ambulatory Visit (INDEPENDENT_AMBULATORY_CARE_PROVIDER_SITE_OTHER): Payer: Medicare Other | Admitting: Family Medicine

## 2017-11-15 ENCOUNTER — Encounter: Payer: Self-pay | Admitting: Family Medicine

## 2017-11-15 VITALS — BP 154/82 | HR 60 | Temp 98.1°F | Ht 64.0 in | Wt 164.4 lb

## 2017-11-15 DIAGNOSIS — I10 Essential (primary) hypertension: Secondary | ICD-10-CM

## 2017-11-15 DIAGNOSIS — Z789 Other specified health status: Secondary | ICD-10-CM

## 2017-11-15 DIAGNOSIS — Z794 Long term (current) use of insulin: Secondary | ICD-10-CM | POA: Diagnosis not present

## 2017-11-15 DIAGNOSIS — M25512 Pain in left shoulder: Secondary | ICD-10-CM

## 2017-11-15 DIAGNOSIS — E785 Hyperlipidemia, unspecified: Secondary | ICD-10-CM

## 2017-11-15 DIAGNOSIS — G8929 Other chronic pain: Secondary | ICD-10-CM

## 2017-11-15 DIAGNOSIS — E1140 Type 2 diabetes mellitus with diabetic neuropathy, unspecified: Secondary | ICD-10-CM

## 2017-11-15 DIAGNOSIS — Z23 Encounter for immunization: Secondary | ICD-10-CM | POA: Diagnosis not present

## 2017-11-15 DIAGNOSIS — E118 Type 2 diabetes mellitus with unspecified complications: Secondary | ICD-10-CM | POA: Diagnosis not present

## 2017-11-15 LAB — HEMOGLOBIN A1C: Hgb A1c MFr Bld: 8.8 % — ABNORMAL HIGH (ref 4.6–6.5)

## 2017-11-15 MED ORDER — GABAPENTIN 300 MG PO CAPS: 300.0000 mg | ORAL_CAPSULE | Freq: Every day | ORAL | 3 refills | Status: DC

## 2017-11-15 NOTE — Patient Instructions (Addendum)
Good to see you today!  You got your flu shot and your pneumonia vaccine today  I will check your A1c to look at your diabetes- will be in touch with this result for you  Let me know if you do want to see someone about your left shoulder pain  Please see an eye doctor for an exam soon

## 2017-11-16 ENCOUNTER — Ambulatory Visit (INDEPENDENT_AMBULATORY_CARE_PROVIDER_SITE_OTHER): Payer: Medicare Other | Admitting: Pharmacist

## 2017-11-16 ENCOUNTER — Telehealth: Payer: Self-pay | Admitting: Pharmacist

## 2017-11-16 ENCOUNTER — Ambulatory Visit
Admission: RE | Admit: 2017-11-16 | Discharge: 2017-11-16 | Disposition: A | Discharge status: Home / Self Care | Payer: Medicare Other | Source: Ambulatory Visit | Attending: Surgery | Admitting: Surgery

## 2017-11-16 VITALS — Wt 170.0 lb

## 2017-11-16 DIAGNOSIS — I7779 Dissection of other artery: Secondary | ICD-10-CM

## 2017-11-16 DIAGNOSIS — K8020 Calculus of gallbladder without cholecystitis without obstruction: Secondary | ICD-10-CM | POA: Diagnosis not present

## 2017-11-16 DIAGNOSIS — E785 Hyperlipidemia, unspecified: Secondary | ICD-10-CM

## 2017-11-16 LAB — LIPID PANEL
Chol/HDL Ratio: 6.3 ratio — ABNORMAL HIGH (ref 0.0–5.0)
Cholesterol, Total: 253 mg/dL — ABNORMAL HIGH (ref 100–199)
HDL: 40 mg/dL (ref >39)
LDL Calculated: 160 mg/dL — ABNORMAL HIGH (ref 0–99)
Triglycerides: 263 mg/dL — ABNORMAL HIGH (ref 0–149)
VLDL Cholesterol Cal: 53 mg/dL — ABNORMAL HIGH (ref 5–40)

## 2017-11-16 MED ORDER — IOPAMIDOL (ISOVUE-370) INJECTION 76%
75.0000 mL | Freq: Once | INTRAVENOUS | Status: AC | PRN
  Administered 2017-11-16: 75 mL via INTRAVENOUS

## 2017-11-16 NOTE — Progress Notes (Addendum)
Patient ID: Barry [name]                 DOB: September 29, 1950                    MRN: [id number]     HPI: Barry [name] is a 67 y.o. male patient of Dr End referred to lipid clinic by Richardson Dopp, PA. PMH is significant for CAD s/p NSTEMI in 02/2014, prior stroke in 2011,  PAD, DM2, HTN, CKD, and HLD. He originally presented in 02/2014 with non-STEMI. LHC demonstrated a ruptured plaque and thrombus in the proximal RCA which was treated with a DES. He was readmitted several weeks later with unstable angina and LHC demonstrated high-grade stenosis in the CFX which was treated with a DES.  Pt is seen today with his son as an interpreter. He brings in all of his medications which match with our medication list on file. He reports adherence to his Crestor 40mg  daily. He did not show for his follow up lipid panel in January but is fasting today [place] we will check a lipid panel. Discussed PCSK9i vs Zetia as potential options depending on results from lipid panel today. Pt prefers PCSK9i as he does not like to take daily pills.  Current Medications: Crestor 40mg  daily Intolerances: atorvastatin Risk Factors: CAD s/p NSTEMI, stroke, PAD, DM, HTN, CKD, former tobacco abuse, family history LDL goal: 70mg /dL  Diet: Rice and soup mostly. He does not eat fried food, red meat, or high fat dairy products.  Exercise: Walks regularly.  Family History: The patient's family history includes Diabetes in his father; Heart attack in his father; Hypertension in his father. There is no history of Stroke.  Social History: Former smoker 1/4 PPD for 38 years, quit in 2008. Denies alcohol and illicit drug use.  Labs: 07/28/17: TC 226, TG 203, HDL 42, LDL 143 (had run out of Crestor a few weeks before)  Past Medical History:  Diagnosis Date  . CAD (coronary artery disease)    a. LHC (02/26/14):  mLAD 20 (faint L>R collats), mCFX 50, pRCA 95 (plaque rupture) >> PCI:  Xience DES to pRCA (severe tortuosity of R innominate artery >> needs L  radial or FA in future);  b. LHC (03/10/14):  CFX 90, oOM 70, pRCA stent patent >> PCI:  Xience DES to mCFX   . Carotid stenosis    a. Carotid US (02/27/14):  Bilateral ICA 1-39%  . Depression   . Diabetes mellitus   . 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[place])    a. Mesenteric Artery Duplex (7/15):  pSMA chronic dissection with aneurysmal dilation of 1.29 cm ([facility]);  b.  Chest CTA (02/26/14):  IMPRESSION:  1. No aortic dissection or other acute abnormality.  2. 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  3. Stable 17 mm ectasia of the left common iliac artery.  4. Atherosclerosis, including aortoiliac and coronary artery disease.   Marland Kitchen History of echocardiogram 09/2015   Echo 1/17: mod LVH, EF 55%, inf-lat HK, Gr 1 DD, mild LAE  . Hx of cardiovascular stress test    Lexiscan Myoview (2/16):  Inferior lateral defect c/w thinning vs small prior infarct, no ischemia, EF 49%, Low Risk  . Hx of echocardiogram    a.  Echocardiogram (02/27/14):  Mod focal basal and mild concentric LVH, EF 50-55%, no RWMA, Gr 1 DD, mild TR, normal RVF  . Hyperlipidemia   . Hypertension   . Iliac artery aneurysm, left ([place])   .  Myocardial infarction ([place])   . Stroke ([place]) 09/26/2009    Current Outpatient Medications on File Prior to Visit  Medication Sig Dispense Refill  . amLODipine (NORVASC) 5 MG tablet Take 1 tablet (5 mg total) by mouth daily. 90 tablet 3  . aspirin EC 81 MG EC tablet Take 1 tablet (81 mg total) by mouth daily.    Marland Kitchen gabapentin (NEURONTIN) 300 MG capsule Take 1 capsule (300 mg total) by mouth at bedtime. 90 capsule 3  . liraglutide 18 MG/3ML SOPN Inject 1.2 mg into the skin daily.    Marland Kitchen lisinopril (PRINIVIL,ZESTRIL) 10 MG tablet Take 1 tablet (10 mg total) by mouth daily. 30 tablet 11  . metFORMIN (GLUCOPHAGE) 500 MG tablet Take 1 tablet (500 mg total) by mouth 2 (two) times daily with a meal. 180 tablet 3  . nitroGLYCERIN (NITROSTAT) 0.4 MG SL tablet Place 1 tablet (0.4 mg total) under the tongue  every 5 (five) minutes as needed for chest pain. 25 tablet 1  . rosuvastatin (CRESTOR) 40 MG tablet Take 1 tablet (40 mg total) by mouth daily. 90 tablet 3  . traZODone (DESYREL) 50 MG tablet Take 0.5-1 tablets (25-50 mg total) by mouth at bedtime as needed for sleep. 30 tablet 3   No current facility-administered medications on file prior to visit.     No Known Allergies  Assessment/Plan:  1. Hyperlipidemia - Fasting lipid panel checked today which reveals higher LDL of 160 on Crestor 40mg  daily above goal 70mg /dL. Zetia will not bring LDL to goal. Will submit prior authorization for Praluent 75mg /mL Q2W.   Megan E. Supple, PharmD, CPP, [facility] [address] [place], [place] [address] Phone: [phone]; Fax: [phone] 11/16/2017 10:23 AM

## 2017-11-16 NOTE — Telephone Encounter (Signed)
LDL worse on today's lipid panel despite reported adherence to Crestor 40mg  daily. Will start prior authorization for Praluent 75mg /mL Q2W.

## 2017-11-16 NOTE — Telephone Encounter (Signed)
Attempted to submit PA for Praluent but Medicaid ID is not currently active. Spoke with patient's son who will look into this.

## 2017-11-20 ENCOUNTER — Encounter: Payer: Self-pay | Admitting: Surgery

## 2017-11-20 ENCOUNTER — Other Ambulatory Visit: Payer: Self-pay

## 2017-11-20 ENCOUNTER — Ambulatory Visit (INDEPENDENT_AMBULATORY_CARE_PROVIDER_SITE_OTHER): Payer: Medicare Other | Admitting: Surgery

## 2017-11-20 VITALS — BP 130/89 | HR 86 | Temp 98.6°F | Resp 18 | Ht 65.0 in | Wt 164.0 lb

## 2017-11-20 DIAGNOSIS — I7779 Dissection of other artery: Secondary | ICD-10-CM

## 2017-11-20 NOTE — Progress Notes (Signed)
Vascular and Vein Specialist of [facility]  Patient name: Barry [name] MRN: [id number] DOB: [birth year] Sex: male   REASON FOR VISIT:    Follow up  HISOTRY OF PRESENT ILLNESS:    Barry [name] is a 67 y.o. male who returns today for follow-up.  I initially met him in 2013 for abdominal pain.  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  He was diagnosed with epiploic appendagitis.  His symptoms resolved.  He returned for recurrent abdominal pain in June 2015.  Around that time he also underwent stenting for a NSTEMI.  The patient was interviewed with a [place] interpreter via telephone.  The patient states that he has not had any problems since I last saw him.  He is feeling much better.  He is gaining weight and not having any issues with abdominal pain.  He does not have trouble walking.   PAST MEDICAL HISTORY:   Past Medical History:  Diagnosis Date  . CAD (coronary artery disease)    a. LHC (02/26/14):  mLAD 20 (faint L>R collats), mCFX 50, pRCA 95 (plaque rupture) >> PCI:  Xience DES to pRCA (severe tortuosity of R innominate artery >> needs L radial or FA in future);  b. LHC (03/10/14):  CFX 90, oOM 70, pRCA stent patent >> PCI:  Xience DES to mCFX   . Carotid stenosis    a. Carotid US (02/27/14):  Bilateral ICA 1-39%  . Depression   . Diabetes mellitus   . 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[place])    a. Mesenteric Artery Duplex (7/15):  pSMA chronic dissection with aneurysmal dilation of 1.29 cm (V[facility]);  b.  Chest CTA (02/26/14):  IMPRESSION:  1. No aortic dissection or other acute abnormality.  2. 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  3. Stable 17 mm ectasia of the left common iliac artery.  4. Atherosclerosis, including aortoiliac and coronary artery disease.   Marland Kitchen History of echocardiogram 09/2015   Echo 1/17: mod LVH, EF 55%, inf-lat HK, Gr 1 DD, mild LAE  . Hx of cardiovascular stress  test    Lexiscan Myoview (2/16):  Inferior lateral defect c/w thinning vs small prior infarct, no ischemia, EF 49%, Low Risk  . Hx of echocardiogram    a.  Echocardiogram (02/27/14):  Mod focal basal and mild concentric LVH, EF 50-55%, no RWMA, Gr 1 DD, mild TR, normal RVF  . Hyperlipidemia   . Hypertension   . Iliac artery aneurysm, left ([place])   . Myocardial infarction ([place])   . Stroke [facility]) 09/26/2009     FAMILY HISTORY:   Family History  Problem Relation Age of Onset  . Diabetes Father   . Hypertension Father   . Heart attack Father   . Stroke Neg Hx     SOCIAL HISTORY:   Social History   Tobacco Use  . Smoking status: Former Smoker    Packs/day: 0.25    Years: 38.00    Pack years: 9.50    Types: Cigarettes    Last attempt to quit: 2008    Years since quitting: 11.1  . Smokeless tobacco: Never Used  Substance Use Topics  . Alcohol use: No    Alcohol/week: 0.0 oz     ALLERGIES:   No Known Allergies   CURRENT MEDICATIONS:   Current Outpatient Medications  Medication Sig Dispense Refill  . amLODipine (NORVASC) 5 MG tablet Take 1 tablet (5 mg total) by mouth daily.  90 tablet 3  . aspirin EC 81 MG EC tablet Take 1 tablet (81 mg total) by mouth daily.    Marland Kitchen gabapentin (NEURONTIN) 300 MG capsule Take 1 capsule (300 mg total) by mouth at bedtime. 90 capsule 3  . liraglutide 18 MG/3ML SOPN Inject 1.2 mg into the skin daily.    Marland Kitchen lisinopril (PRINIVIL,ZESTRIL) 10 MG tablet Take 1 tablet (10 mg total) by mouth daily. 30 tablet 11  . metFORMIN (GLUCOPHAGE) 500 MG tablet Take 1 tablet (500 mg total) by mouth 2 (two) times daily with a meal. 180 tablet 3  . nitroGLYCERIN (NITROSTAT) 0.4 MG SL tablet Place 1 tablet (0.4 mg total) under the tongue every 5 (five) minutes as needed for chest pain. 25 tablet 1  . rosuvastatin (CRESTOR) 40 MG tablet Take 1 tablet (40 mg total) by mouth daily. 90 tablet 3  . traZODone (DESYREL) 50 MG tablet Take 0.5-1 tablets (25-50 mg total) by  mouth at bedtime as needed for sleep. 30 tablet 3   No current facility-administered medications for this visit.     REVIEW OF SYSTEMS:   [X] denotes positive finding, [ ] denotes negative finding Cardiac  Comments:  Chest pain or chest pressure:    Shortness of breath upon exertion:    Short of breath when lying flat:    Irregular heart rhythm:        Vascular    Pain in calf, thigh, or hip brought on by ambulation:    Pain in feet at night that wakes you up from your sleep:     Blood clot in your veins:    Leg swelling:         Pulmonary    Oxygen at home:    Productive cough:     Wheezing:         Neurologic    Sudden weakness in arms or legs:     Sudden numbness in arms or legs:     Sudden onset of difficulty speaking or slurred speech:    Temporary loss of vision in one eye:     Problems with dizziness:         Gastrointestinal    Blood in stool:     Vomited blood:         Genitourinary    Burning when urinating:     Blood in urine:        Psychiatric    Major depression:         Hematologic    Bleeding problems:    Problems with blood clotting too easily:        Skin    Rashes or ulcers:        Constitutional    Fever or chills:      PHYSICAL EXAM:   There were no vitals filed for this visit.  GENERAL: The patient is a well-nourished male, in no acute distress. The vital signs are documented above. CARDIAC: There is a regular rate and rhythm.  VASCULAR: Palpable femoral pulses.  No carotid bruit. PULMONARY: Non-labored respirations ABDOMEN: Soft and non-tender with normal pitched bowel sounds.  MUSCULOSKELETAL: There are no major deformities or cyanosis. NEUROLOGIC: No focal weakness or paresthesias are detected. SKIN: There are no ulcers or rashes noted. PSYCHIATRIC: The patient has a normal affect.  STUDIES:   I have reviewed his CTA with the following findings:  1. Stable appearance of non flow limiting chronic dissection of  the superior mesenteric artery. 2. Interval progression  of a penetrating aortic ulcer in the distal abdominal aorta now resulting in a short segment non flow limiting chronic dissection. The maximal aortic diameter at this location is mildly ectatic at 2.2 cm. 3. Interval progression of a penetrating ulcer arising from the inferior wall of the left common iliac artery also now resulting in a chronic, non flow limiting short segment dissection. 4. Progressive fibrofatty plaque results in narrow high-grade stenosis in the left hypogastric artery. 5. Mild-to-moderate 2.3 cm stenosis of the proximal superior mesenteric artery, no significant change. 6. Stable ectasia of the left common iliac artery to 1.7 cm. 7. Irregular predominantly fibrofatty atherosclerotic plaque throughout the abdominal aorta. Aortic Atherosclerosis (ICD10-170.0).  NON-VASCULAR  1. No acute abnormality within the abdomen or pelvis. 2. Stable appearance of the distal tendons with remains thickened 1.1 cm. There has been no interval change over multiple prior studies dating back to at least February of 2014 suggesting a benign process. A benign or indolent appendiceal carcinoid is a consideration. 3. Cholelithiasis. 4. Right lower pole nephrolithiasis.  MEDICAL ISSUES:   Chronic mesenteric dissection: The patient remains stable.  He has no signs of mesenteric ischemia and no evidence of aneurysmal change on CT scan.  There have been progression of penetrating ulcers on his aorta however these are not associated with aneurysmal change.  I will continue to follow this and repeat his CT scan in 2 years.    Annamarie Major, MD Vascular and Vein Specialists of [facility] [phone] Pager [phone]

## 2017-11-27 ENCOUNTER — Ambulatory Visit: Payer: Medicare Other | Admitting: Surgery

## 2017-11-28 ENCOUNTER — Telehealth: Payer: Self-pay | Admitting: Pharmacist

## 2017-11-28 MED ORDER — ALIROCUMAB 75 MG/ML ~~LOC~~ SOPN: 1.0000 "pen " | PEN_INJECTOR | SUBCUTANEOUS | 11 refills | Status: DC

## 2017-11-28 MED ORDER — METFORMIN HCL 1000 MG PO TABS: 1000.0000 mg | ORAL_TABLET | Freq: Two times a day (BID) | ORAL | 3 refills | Status: DC

## 2017-11-28 NOTE — Telephone Encounter (Signed)
PA for Praluent has been approved through 09/25/18. Rx sent to specialty pharmacy to assess if copay is affordable.

## 2017-11-28 NOTE — Addendum Note (Signed)
Addended by: Lamar Blinks C on: 11/28/2017 09:08 AM   Modules accepted: Orders

## 2017-11-30 NOTE — Telephone Encounter (Signed)
Praluent copay is affordable at $8.50 per month. Specialty pharmacy will reach out to patient's son to coordinate shipment.

## 2017-12-04 ENCOUNTER — Telehealth: Payer: Self-pay | Admitting: Family Medicine

## 2017-12-04 ENCOUNTER — Other Ambulatory Visit: Payer: Self-pay | Admitting: Pharmacist

## 2017-12-04 NOTE — Patient Outreach (Signed)
[name]) Care Management  12/04/2017  Barry [name] 01/05/1951 [id number]   Successful home visit with Barry [name] for medication adherence accompanied by Barry [name] pharmacists and interpretor.    HIPAA identifiers verified.  Subjective:    Barry [name] reports feeling, "good, more, more, more."  Patient reports he had been outside prior to our visit enjoying the nice weather.  He reports that he hasn't taken his medications in the last two weeks because he is feeling good and states he doesn't take his medication when he feels good.   He also states that he does not check his blood pressure or blood glucose when he is feeling good.  Throughout the visit, Barry [name] repeatedly states that his medications make him constipated and that they make his stomach hurt.   Barry [name] states he drinks herbal tea that a friend recommended for constipation and blood pressure.  Objective:  During visit:  Blood pressure: 198/115, 211/118 repeated value after 30 minutes Heart rate: 79, 71 repeated value after 30 minutes CBG: 298   11/15/2017 Hemoglobin A1C = 8.8  Medications Reviewed Today    Reviewed by Rudean Haskell, RPH (Pharmacist) on 12/04/17 at 1041  Med List Status: <None>  Medication Order Taking? Sig Documenting Provider Last Dose Status Informant  Alirocumab (PRALUENT) 75 MG/ML SOPN [id number] No Inject 1 pen into the skin every 14 (fourteen) days.  Patient not taking:  Reported on 12/04/2017   End, Harrell Gave, MD Not Taking Active   amLODipine (NORVASC) 5 MG tablet [id number] No Take 1 tablet (5 mg total) by mouth daily.  Patient not taking:  Reported on 12/04/2017   Copland, Gay Filler, MD Not Taking Active   aspirin EC 81 MG EC tablet [id number] No Take 1 tablet (81 mg total) by mouth daily.  Patient not taking:  Reported on 12/04/2017   Geradine Girt, DO Not Taking Active   gabapentin (NEURONTIN) 300 MG capsule [id number] No Take 1 capsule (300 mg total) by mouth at bedtime.  Patient not taking:   Reported on 12/04/2017   Copland, Gay Filler, MD Not Taking Active   liraglutide 18 MG/3ML SOPN [id number] No Inject 1.2 mg into the skin daily. [provider] Not Taking Active Self  lisinopril (PRINIVIL,ZESTRIL) 10 MG tablet [id number] No Take 1 tablet (10 mg total) by mouth daily.  Patient not taking:  Reported on 12/04/2017   Liliane Shi, PA-C Not Taking Active Family Member  metFORMIN (GLUCOPHAGE) 1000 MG tablet [id number] No Take 1 tablet (1,000 mg total) by mouth 2 (two) times daily with a meal.  Patient not taking:  Reported on 12/04/2017   Copland, Gay Filler, MD Not Taking Active   nitroGLYCERIN (NITROSTAT) 0.4 MG SL tablet [id number] No Place 1 tablet (0.4 mg total) under the tongue every 5 (five) minutes as needed for chest pain.  Patient not taking:  Reported on 12/04/2017   Wendie Agreste, MD Not Taking Active Family Member  rosuvastatin (CRESTOR) 40 MG tablet [id number] No Take 1 tablet (40 mg total) by mouth daily.  Patient not taking:  Reported on 12/04/2017   Liliane Shi, PA-C Not Taking Active   traZODone (DESYREL) 50 MG tablet [id number] No Take 0.5-1 tablets (25-50 mg total) by mouth at bedtime as needed for sleep.  Patient not taking:  Reported on 12/04/2017   Copland, Gay Filler, MD Not Taking Active            Assessment: Medication adherence: Patient has been non-adherent  with his medications for several weeks per verbal report.  He has all medications present in home but stops taking them when he has constipation or when he feels "fine." Per chart review, patient has been approved to start alirocumab (Praulent) however medication has not arrived yet.   Patient verbalized that he wants to continue to stay off his medications at this time due to perceived potential side effects despite elevated repeated counseling on risks of elevated blood pressure and blood glucose.   Hypertension: Uncontrolled, hypertensive urgency with blood pressure 198/115 and 211/118.   Patient is asymptomatic.  We reviewed risks of hypertension including stroke and recognizing symptoms with FAST acronym.  Patient and girlfriend voiced understanding and that patient aware of signs and symptoms as he has had a stroke in the past. Patient took amlodipine and lisinopril during our visit but did not commit to taking these medications in the future.  We encouraged him to continue to take his medications as prescribed and to check his blood pressure later today to ensure it has improved.    Diabetes:  Diabetes mellitus type II, long-standing, A1C worsened from 7.4 --> 8.8 over last 3 months.   Patient has both metformin 500mg  and 1000mg  BID and is not sure which to take.  We counseled patient to take the most current prescription of 1000mg  BID however reviewed with him that the dose was increased based on the assumption that he is compliant with his medications.  We discussed the importance of taking the metformin with food to minimize GI side effects.  Barry [name] denies diarrhea which is a common side effect of metformin.  We discussed various ways to manage his constipation including diet, Miralax and drinking plenty of fluids.    Plan:  . I will route my note to patient's PCP, Barry [name], and call office regarding patient's noncompliance and significantly elevated blood pressure today during home visit.  . [place] will close patient's case as he is not interested at this time in [facility] services.  I am happy to assist in the future as needed.  I will alert [name] CMA of case closure and route MD closure letter.    [name], PharmD, [place] [phone]

## 2017-12-04 NOTE — Telephone Encounter (Signed)
Called her back- she has tried to get Barry [name] to take his medications and has explained the risks of non- compliance.  I thanked her for her efforts.  We are glad to see him if he wishes to receive care

## 2017-12-04 NOTE — Telephone Encounter (Signed)
Copied from [place]. Topic: Quick Communication - See Telephone Encounter >> Dec 04, 2017 12:28 PM Ether Griffins B wrote: CRM for notification. See Telephone encounter for:  Anderson Malta with [facility] calling about pt. They did a home check this morning for medication adherence and pt hasn't taken any medication in 2 weeks. His BP was 211/118 this morning. They did get him to take his BP medication. Also his glucose was 298. Call back # [phone].  12/04/17.

## 2017-12-14 ENCOUNTER — Other Ambulatory Visit: Payer: Self-pay | Admitting: Family Medicine

## 2017-12-14 DIAGNOSIS — E1140 Type 2 diabetes mellitus with diabetic neuropathy, unspecified: Secondary | ICD-10-CM

## 2017-12-14 DIAGNOSIS — Z794 Long term (current) use of insulin: Principal | ICD-10-CM

## 2017-12-16 DIAGNOSIS — R079 Chest pain, unspecified: Secondary | ICD-10-CM | POA: Diagnosis not present

## 2017-12-16 DIAGNOSIS — Z5321 Procedure and treatment not carried out due to patient leaving prior to being seen by health care provider: Secondary | ICD-10-CM | POA: Insufficient documentation

## 2017-12-16 DIAGNOSIS — R42 Dizziness and giddiness: Secondary | ICD-10-CM | POA: Diagnosis not present

## 2017-12-16 DIAGNOSIS — M6281 Muscle weakness (generalized): Secondary | ICD-10-CM | POA: Diagnosis not present

## 2017-12-16 DIAGNOSIS — R0789 Other chest pain: Secondary | ICD-10-CM | POA: Diagnosis not present

## 2017-12-17 ENCOUNTER — Emergency Department (HOSPITAL_COMMUNITY): Payer: Medicare Other

## 2017-12-17 ENCOUNTER — Encounter (HOSPITAL_COMMUNITY): Payer: Self-pay | Admitting: Emergency Medicine

## 2017-12-17 ENCOUNTER — Other Ambulatory Visit: Payer: Self-pay

## 2017-12-17 ENCOUNTER — Emergency Department (HOSPITAL_COMMUNITY)
Admission: EM | Admit: 2017-12-17 | Discharge: 2017-12-17 | Disposition: A | Discharge status: Home / Self Care | Payer: Medicare Other | Attending: Emergency Medicine | Admitting: Emergency Medicine

## 2017-12-17 DIAGNOSIS — R079 Chest pain, unspecified: Secondary | ICD-10-CM | POA: Diagnosis not present

## 2017-12-17 LAB — CBC
HCT: 46.1 % (ref 39.0–52.0)
Hemoglobin: 15.4 g/dL (ref 13.0–17.0)
MCH: 28.6 pg (ref 26.0–34.0)
MCHC: 33.4 g/dL (ref 30.0–36.0)
MCV: 85.7 fL (ref 78.0–100.0)
Platelets: 180 10*3/uL (ref 150–400)
RBC: 5.38 MIL/uL (ref 4.22–5.81)
RDW: 12.4 % (ref 11.5–15.5)
WBC: 9.5 10*3/uL (ref 4.0–10.5)

## 2017-12-17 LAB — BASIC METABOLIC PANEL
Anion gap: 11 (ref 5–15)
BUN: 11 mg/dL (ref 6–20)
CO2: 27 mmol/L (ref 22–32)
Calcium: 10.5 mg/dL — ABNORMAL HIGH (ref 8.9–10.3)
Chloride: 99 mmol/L — ABNORMAL LOW (ref 101–111)
Creatinine, Ser: 1.18 mg/dL (ref 0.61–1.24)
GFR calc Af Amer: >60 mL/min (ref >60)
GFR calc non Af Amer: >60 mL/min (ref >60)
Glucose, Bld: 216 mg/dL — ABNORMAL HIGH (ref 65–99)
Potassium: 3.8 mmol/L (ref 3.5–5.1)
Sodium: 137 mmol/L (ref 135–145)

## 2017-12-17 LAB — I-STAT TROPONIN, ED: Troponin i, poc: 0.01 ng/mL (ref 0.00–0.08)

## 2017-12-17 NOTE — ED Triage Notes (Signed)
Pt c/o 8/10 cp and HTN for the past 2 hours, pt states he has intermittent cp during the weak today he started feeling dizziness and weakness, no neuro deficit noticed on triage.

## 2017-12-17 NOTE — ED Notes (Addendum)
Family up to nurse first desk stating they no longer want to wait to be seen. LWBS after triage. [place] cut off, ambulatory out of department.

## 2017-12-22 ENCOUNTER — Telehealth: Payer: Self-pay | Admitting: *Deleted

## 2017-12-22 NOTE — Telephone Encounter (Signed)
I called per Richardson Dopp, PA to make sure pt gets an appt in April.  I left message for pt's son Pros to call and make appt to see Dr. Saunders Revel or Richardson Dopp, [place] in April, 2019.

## 2017-12-22 NOTE — Telephone Encounter (Signed)
-----   Message from Liliane Shi, [place] sent at 12/19/2017  4:25 PM EDT ----- Regarding: Needs reg FU appt Please make sure he gets set up for his regular follow up visit.  He is due in April Thanks [place]

## 2017-12-25 ENCOUNTER — Encounter: Payer: Self-pay | Admitting: Physician Assistant

## 2017-12-27 NOTE — Telephone Encounter (Signed)
lmptcb x 2 , per Richardson Dopp, PA pt needs to schedule his regular follow up with Dr. Saunders Revel or Richardson Dopp, PA.

## 2018-01-01 ENCOUNTER — Encounter: Payer: Self-pay | Admitting: *Deleted

## 2018-01-01 NOTE — Telephone Encounter (Signed)
LMTCB X 3 TO SCHEDULE APPT. See previous notes. I will send out letter today to the pt to make an appt.

## 2018-01-24 ENCOUNTER — Ambulatory Visit (INDEPENDENT_AMBULATORY_CARE_PROVIDER_SITE_OTHER): Payer: Medicare Other | Admitting: Physician Assistant

## 2018-01-24 ENCOUNTER — Encounter: Payer: Self-pay | Admitting: Physician Assistant

## 2018-01-24 VITALS — BP 146/90 | HR 65 | Ht 69.0 in | Wt 159.4 lb

## 2018-01-24 DIAGNOSIS — I10 Essential (primary) hypertension: Secondary | ICD-10-CM | POA: Diagnosis not present

## 2018-01-24 DIAGNOSIS — I2510 Atherosclerotic heart disease of native coronary artery without angina pectoris: Secondary | ICD-10-CM | POA: Diagnosis not present

## 2018-01-24 DIAGNOSIS — I739 Peripheral vascular disease, unspecified: Secondary | ICD-10-CM

## 2018-01-24 DIAGNOSIS — I7779 Dissection of other artery: Secondary | ICD-10-CM

## 2018-01-24 DIAGNOSIS — E785 Hyperlipidemia, unspecified: Secondary | ICD-10-CM

## 2018-01-24 NOTE — Progress Notes (Signed)
Cardiology Office Note    Date:  01/24/2018   ID:  Barry [name], DOB [birth year], MRN [id number]  PCP:  Darreld Mclean, MD  Cardiologist:  Dr. Loralie Champagne >> Dr. Harrell Gave End / Richardson Dopp, PA-C   Chief Complaint: 6 months follow up for CAD  History of Present Illness:   Barry [name] is a 67 y.o. male with a hx of CAD, chronic superior mesenteric artery dissection followed by Dr. Trula [name] , prior stroke, DM2, HTN, CKD, HL  presents for follow up.   [place] speaking.   He originally presented in 02/2014 with non-STEMI. LHC demonstrated a ruptured plaque and thrombus in the proximal RCA which was treated with a DES. He was readmitted several weeks later with unstable angina and LHC demonstrated high-grade stenosis in the CFX which was treated with a DES.Lexiscan Myoview in 2/16 was low risk with inferior lateral defect consistent with thinning versus small prior infarct. There was no ischemia. EF was 49%. LHC was arranged 9/16 for recurrent chest pain and demonstrated 2v CAD with patent mid LCx/OM2 stent and patent RCA stent, mod stenosis in a small caliber sub-branch of OM2 jailed by the old stent (unchanged from 2015), mod stenosis in a small to mod caliber ostial PDA (unchanged from 2015). Med Rx was recommended. He was seen in the emergency room 7/18 for syncope felt to be related to dehydration.    Here today for six-months follow-up with in-house interpreter.  He denies any exertional chest pain or shortness of breath.  Recently treated with left shoulder pain radiating to his arm.  X-ray was reassuring per patient.  Recurrent pain with lifting shoulder or weight.  No associated chest pressure.  Not typical of prior chest pain.  Denies orthopnea, PND, syncope, lower extremity edema or melena.   Past Medical History:  Diagnosis Date  . CAD (coronary artery disease)    a. LHC (02/26/14):  mLAD 20 (faint L>R collats), mCFX 50, pRCA 95 (plaque rupture) >> PCI:  Xience DES to pRCA (severe  tortuosity of R innominate artery >> needs L radial or FA in future);  b. LHC (03/10/14):  CFX 90, oOM 70, pRCA stent patent >> PCI:  Xience DES to mCFX   . Carotid stenosis    a. Carotid US (02/27/14):  Bilateral ICA 1-39%  . Depression   . Diabetes mellitus   . 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[place])    a. Mesenteric Artery Duplex (7/15):  pSMA chronic dissection with aneurysmal dilation of 1.29 cm ([facility]);  b.  Chest CTA (02/26/14):  IMPRESSION:  1. No aortic dissection or other acute abnormality.  2. 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  3. Stable 17 mm ectasia of the left common iliac artery.  4. Atherosclerosis, including aortoiliac and coronary artery disease.   Marland Kitchen History of echocardiogram 09/2015   Echo 1/17: mod LVH, EF 55%, inf-lat HK, Gr 1 DD, mild LAE  . Hx of cardiovascular stress test    Lexiscan Myoview (2/16):  Inferior lateral defect c/w thinning vs small prior infarct, no ischemia, EF 49%, Low Risk  . Hx of echocardiogram    a.  Echocardiogram (02/27/14):  Mod focal basal and mild concentric LVH, EF 50-55%, no RWMA, Gr 1 DD, mild TR, normal RVF  . Hyperlipidemia   . Hypertension   . Iliac artery aneurysm, left ([place])   . Myocardial infarction ([place])   . Stroke Barry [name]) 09/26/2009    Past Surgical History:  Procedure Laterality Date  .  CARDIAC CATHETERIZATION    . CARDIAC CATHETERIZATION N/A 06/25/2015   Procedure: Left Heart Cath and Coronary Angiography;  Surgeon: Burnell Blanks, MD;  Location: [facility];  Service: Cardiovascular;  Laterality: N/A;  . COLONOSCOPY WITH PROPOFOL N/A 07/23/2015   Procedure: COLONOSCOPY WITH PROPOFOL;  Surgeon: [name], MD;  Location: [facility];  Service: Endoscopy;  Laterality: N/A;  . CORONARY ANGIOPLASTY    . LEFT HEART CATHETERIZATION WITH CORONARY ANGIOGRAM N/A 02/26/2014   Procedure: LEFT HEART CATHETERIZATION WITH CORONARY ANGIOGRAM;  Surgeon: Wellington Hampshire, MD;  Location: [facility];  Service:  Cardiovascular;  Laterality: N/A;  . LEFT HEART CATHETERIZATION WITH CORONARY ANGIOGRAM N/A 03/10/2014   Procedure: LEFT HEART CATHETERIZATION WITH CORONARY ANGIOGRAM;  Surgeon: Jettie Booze, MD;  Location: [facility] CATH LAB;  Service: Cardiovascular;  Laterality: N/A;    Current Medications: Prior to Admission medications   Medication Sig Start Date End Date Taking? Authorizing Provider  Alirocumab (PRALUENT) 75 MG/ML SOPN Inject 1 pen into the skin every 14 (fourteen) days. 11/28/17  Yes End, Harrell Gave, MD  amLODipine (NORVASC) 5 MG tablet Take 1 tablet (5 mg total) by mouth daily. 06/07/17  Yes Copland, Gay Filler, MD  aspirin EC 81 MG EC tablet Take 1 tablet (81 mg total) by mouth daily. 05/05/17  Yes Vann, Jessica U, DO  gabapentin (NEURONTIN) 300 MG capsule Take 1 capsule (300 mg total) by mouth at bedtime. 11/15/17  Yes Copland, Gay Filler, MD  liraglutide 18 MG/3ML SOPN Inject 1.2 mg into the skin daily.   Yes [provider]  lisinopril (PRINIVIL,ZESTRIL) 10 MG tablet Take 1 tablet (10 mg total) by mouth daily. 10/19/16  Yes Weaver, Scott T, PA-C  metFORMIN (GLUCOPHAGE) 1000 MG tablet Take 1 tablet (1,000 mg total) by mouth 2 (two) times daily with a meal. 11/28/17  Yes Copland, Gay Filler, MD  nitroGLYCERIN (NITROSTAT) 0.4 MG SL tablet Place 1 tablet (0.4 mg total) under the tongue every 5 (five) minutes as needed for chest pain. 10/18/16  Yes Wendie Agreste, MD  rosuvastatin (CRESTOR) 40 MG tablet Take 1 tablet (40 mg total) by mouth daily. 06/28/17 06/23/18 Yes Weaver, Scott T, PA-C  traZODone (DESYREL) 50 MG tablet Take 0.5-1 tablets (25-50 mg total) by mouth at bedtime as needed for sleep. 06/07/17  Yes Copland, Gay Filler, MD    Allergies:   Patient has no known allergies.   Social History   Socioeconomic History  . Marital status: Married    Spouse name: Not on file  . Number of children: 3  . Years of education: Not on file  . Highest education level: Not on file    Occupational History  . Occupation: Retired  Scientific laboratory technician  . Financial resource strain: Not on file  . Food insecurity:    Worry: Not on file    Inability: Not on file  . Transportation needs:    Medical: Not on file    Non-medical: Not on file  Tobacco Use  . Smoking status: Former Smoker    Packs/day: 0.25    Years: 38.00    Pack years: 9.50    Types: Cigarettes    Last attempt to quit: 2008    Years since quitting: 11.3  . Smokeless tobacco: Never Used  Substance and Sexual Activity  . Alcohol use: No    Alcohol/week: 0.0 oz  . Drug use: No  . Sexual activity: Yes  Lifestyle  . Physical activity:  Days per week: Not on file    Minutes per session: Not on file  . Stress: Not on file  Relationships  . Social connections:    Talks on phone: Not on file    Gets together: Not on file    Attends religious service: Not on file    Active member of club or organization: Not on file    Attends meetings of clubs or organizations: Not on file    Relationship status: Not on file  Other Topics Concern  . Not on file  Social History Narrative   Marital: married.      Lives: with son,wife.       Children: 3 children; 6 grandchildren      Employed: unemployed; disability unknown reason/CVA L sided weakness      Tobacco:  Quit 2012; smoked 40 years.       Alcohol: no drinking now; social in past.       Drugs: none       Exercise: sporadic.       ADLs:  No driving since CVA.     Family History:  The patient's family history includes Diabetes in his father; Heart attack in his father; Hypertension in his father.   ROS:   Please see the history of present illness.    ROS All other systems reviewed and are negative.   PHYSICAL EXAM:   VS:  BP (!) 146/90 (BP Location: Left Arm, Patient Position: Sitting)   Pulse 65   Ht 5\' 9"  (1.753 m)   Wt 159 lb 6.4 oz (72.3 kg)   SpO2 98%   BMI 23.54 kg/m    GEN: Well nourished, well developed, in no acute distress  HEENT: normal   Neck: no JVD, carotid bruits, or masses Cardiac: RRR; no murmurs, rubs, or gallops,no edema  Respiratory:  clear to auscultation bilaterally, normal work of breathing GI: soft, nontender, nondistended, + BS MS: no deformity or atrophy  Skin: warm and dry, no rash Neuro:  Alert and Oriented x 3, Strength and sensation are intact Psych: euthymic mood, full affect  Wt Readings from Last 3 Encounters:  01/24/18 159 lb 6.4 oz (72.3 kg)  12/17/17 155 lb (70.3 kg)  11/20/17 164 lb (74.4 kg)      Studies/Labs Reviewed:   EKG:  EKG is not ordered today.    Recent Labs: 06/07/2017: ALT 13; TSH 1.34 08/09/2017: Pro B Natriuretic peptide (BNP) 28.0 12/17/2017: BUN 11; Creatinine, Ser 1.18; Hemoglobin 15.4; Platelets 180; Potassium 3.8; Sodium 137   Lipid Panel    Component Value Date/Time   CHOL 253 (H) 11/16/2017 1025   TRIG 263 (H) 11/16/2017 1025   HDL 40 11/16/2017 1025   CHOLHDL 6.3 (H) 11/16/2017 1025   CHOLHDL 5 04/20/2015 0951   VLDL 41.4 (H) 04/20/2015 0951   LDLCALC 160 (H) 11/16/2017 1025   LDLDIRECT 101.0 04/20/2015 0951    Additional studies/ records that were reviewed today include:   Echocardiogram: 09/2015 Study Conclusions  - Left ventricle: The cavity size was normal. Wall thickness was   increased in a pattern of moderate LVH. The estimated ejection   fraction was 55%. Inferolateral hypokinesis. Doppler parameters   are consistent with abnormal left ventricular relaxation (grade 1   diastolic dysfunction). - Aortic valve: There was no stenosis. There was trivial   regurgitation. - Mitral valve: There was no significant regurgitation. - Left atrium: The atrium was mildly dilated. - Right ventricle: The cavity size was normal. Systolic  function   was normal. - Pulmonary arteries: No complete TR doppler jet Sattar unable to   estimate PA systolic pressure. - Systemic veins: IVC poorly visualized.  Impressions:  - Normal LV size with moderate LV  hypertrophy. EF 55%.   Inferolateral hypokinesis. Normal RV size and systolic function.   No significant valvular abnormalities.  Left Heart Cath and Coronary Angiography  05/2015  Conclusion    Ost 2nd Mrg to 2nd Mrg lesion with previously placed drug-eluting stent with minimal restenosis.  Lat 2nd Mrg lesion, 80% stenosed. Jailed by old stent. Unchanged from last cath.  Prox Cx lesion, 20% stenosed.  Ost LAD lesion, 30% stenosed.  Prox LAD to Mid LAD lesion, 30% stenosed.  Dist LAD lesion, 30% stenosed.  1st Diag lesion, 30% stenosed.  Ost RCA to Prox RCA lesion, 15% stenosed. The lesion was previously treated with a stent (unknown type) .  Mid RCA to Dist RCA lesion, 20% stenosed.  Dist RCA lesion, 20% stenosed.  Ost RPDA lesion, 60% stenosed. Small to moderate caliber vessel. Unchanged from last cath.  The left ventricular systolic function is normal.   1. Double vessel CAD with patent stent mid Circumflex/OM2 and patent stent RCA 2. Moderate stenosis in the small caliber sub-branch of OM2 where it is jailed by the old stent. This is a small caliber branch vessel. Unchanged in appearance from last cath in 2015.  3. Moderate stenosis in the small to moderate caliber ostial PDA, unchanged from last cath in 2015.  4. Normal LV function  Recommendations: Continue medical therapy for CAD.     ASSESSMENT & PLAN:    1. CAD -No chest pain or shortness of breath.  Continue aspirin and statin.  2. Chronic superior mesenteric artery dissection followed by Dr. Trula [name]   3.  Hyperlipidemia - 11/16/2017: Cholesterol, Total 253; HDL 40; LDL Calculated 160; Triglycerides 263 -  Continue Crestor 40 mg and Praluent.  Followed by lipid clinic.  4.  Hypertension -Blood pressure elevated today.  Has not took his medication.  Continue current dose of Norvasc 5 mg and lisinopril 10 mg daily.  Advised to keep log.  Call us if consistently elevated blood pressure.  5.  Left shoulder  pain -Seem musculoskeletal in origin.  Worse with lifting on my exam.  Discussed orthopedic referral however patient declined. He does not think this is related to his heart.   Medication Adjustments/Labs and Tests Ordered: Current medicines are reviewed at length with the patient today.  Concerns regarding medicines are outlined above.  Medication changes, Labs and Tests ordered today are listed in the Patient Instructions below. There are no Patient Instructions on file for this visit.   Jarrett Soho, [place]  01/24/2018 8:13 AM    [address], [place], [place]  [address] Phone: [phone]; Fax: [phone]

## 2018-01-24 NOTE — Patient Instructions (Signed)
Medication Instructions: Your physician recommends that you continue on your current medications as directed. Please refer to the Current Medication list given to you today.  Labwork: None Ordered  Procedures/Testing: None Ordered  Follow-Up: Your physician wants you to follow-up in: 6 MONTHS with Dr. Saunders Revel. You will receive a reminder letter in the mail two months in advance. If you don't receive a letter, please call our office to schedule the follow-up appointment.    If you need a refill on your cardiac medications before your next appointment, please call your pharmacy.

## 2018-02-09 NOTE — Progress Notes (Signed)
[place] at [facility] [address] [place], [place] [address] [phone]  Date:  02/12/2018   Name:  Barry [name]   DOB:  1951/04/19   MRN:  [id number]  PCP:  Darreld Mclean, MD    Chief Complaint: Diabetes (3 month follow up, no problems)   History of Present Illness:  Barry [name] is a 67 y.o. very pleasant male patient who presents with the following:  Following up with me today, last seen by myself in February. History of CAD, CHF, DM, NSTEMI, dyslipidemia, HTN, language barrier, stroke, chronic superior mesenteric artery dissection and left common iliac artery penetrating ulcer This is a challenging patient in part due to language barrier and non- compliance, as well as concern about SE of his meds.  From recent pt outreach note: He reports that he hasn't taken his medications in the last two weeks because he is feeling good and states he doesn't take his medication when he feels good.   He also states that he does not check his blood pressure or blood glucose when he is feeling good.  Throughout the visit, Mr. Rogalski repeatedly states that his medications make him constipated and that they make his stomach hurt.   Mr. Rothman states he drinks herbal tea that a friend recommended for constipation and blood pressure.  Per Dr. Stephens Shire note from February: Barry [name] is a 67 y.o. male who returns today for follow-up.  I initially met him in 2013 for abdominal pain.  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  He was diagnosed with epiploic appendagitis.  His symptoms resolved.  He returned for recurrent abdominal pain in June 2015.  Around that time he also underwent stenting for a NSTEMI.////////////////////////// have reviewed his CTA with the following findings:  1. Stable appearance of non flow limiting chronic dissection of the superior mesenteric artery. 2. Interval progression  of a penetrating aortic ulcer in the distal abdominal aorta now resulting in a short segment non flow limiting chronic dissection. The maximal aortic diameter at this location is mildly ectatic at 2.2 cm. 3. Interval progression of a penetrating ulcer arising from the inferior wall of the left common iliac artery also now resulting in a chronic, non flow limiting short segment dissection. 4. Progressive fibrofatty plaque results in narrow high-grade stenosis in the left hypogastric artery. 5. Mild-to-moderate 2.3 cm stenosis of the proximal superior mesenteric artery, no significant change. 6. Stable ectasia of the left common iliac artery to 1.7 cm. 7. Irregular predominantly fibrofatty atherosclerotic plaque throughout the abdominal aorta. Aortic Atherosclerosis (ICD10-170.0) NON-VASCULAR  1. No acute abnormality within the abdomen or pelvis. 2. Stable appearance of the distal tendons with remains thickened 1.1 cm. There has been no interval change over multiple prior studies dating back to at least February of 2014 suggesting a benign process. A benign or indolent appendiceal carcinoid is a consideration. 3. Cholelithiasis. 4. Right lower pole nephrolithiasis.  MEDICAL ISSUES:   Chronic mesenteric dissection: The patient remains stable.  He has no signs of mesenteric ischemia and no evidence of aneurysmal change on CT scan.  There have been progression of penetrating ulcers on his aorta however these are not associated with aneurysmal change.  I will continue to follow this and repeat his CT scan in 2 years.   He also saw cardiology earlier this month: He originally presented in 02/2014 with non-STEMI. LHC  demonstrated a ruptured plaque and thrombus in the proximal RCA which was treated with a DES. He was readmitted several weeks later with unstable angina and LHC demonstrated high-grade stenosis in the CFX which was treated with a DES.Lexiscan Myoview in 2/16was low risk  with inferior lateral defect consistent with thinning versus small prior infarct. There was no ischemia. EF was 49%. LHC was arranged 9/16 for recurrent chest pain and demonstrated 2v CAD with patent mid LCx/OM2 stent and patent RCA stent, mod stenosis in a small caliber sub-branch of OM2 jailed by the old stent (unchanged from 2015), mod stenosis in a small to mod caliber ostial PDA (unchanged from 2015). Med Rx was recommended.He was seen in the emergency room 7/18 for syncope felt to be related to dehydration///////////////////// 1. CAD -No chest pain or shortness of breath.  Continue aspirin and statin. 2. Chronic superior mesenteric artery dissection followed by Dr. Trula Slade  3.  Hyperlipidemia - 11/16/2017: Cholesterol, Total 253; HDL 40; LDL Calculated 160; Triglycerides 263 -  Continue Crestor 40 mg and Praluent.  Followed by lipid clinic. 4.  Hypertension -Blood pressure elevated today.  Has not took his medication.  Continue current dose of Norvasc 5 mg and lisinopril 10 mg daily.  Advised to keep log.  Call us if consistently elevated blood pressure.   Lab Results  Component Value Date   HGBA1C 8.8 (H) 11/15/2017   A1c is due today Due for eye exam- per pt they do have an eye doctor who they can see and they will followup  Lipids done in February of this year  He is supposed to be using parluent for his lipids per lipid clinic, also crestor For DM victoza injection and metformin   Called and spoke to pts son on the phone. He reports that pt is using both metfomin and victoza, and denies any issues such as hypoglycemia    Patient Active Problem List   Diagnosis Date Noted  . Atherosclerotic peripheral vascular disease with ulceration ([place]) 04/03/2015  . Coronary artery disease involving native coronary artery of native heart without angina pectoris 03/28/2014  . Aneurysm of iliac artery ([place]) 03/18/2014  . Abdominal pain, unspecified site 03/18/2014  . Syncope in setting of  nausea and vomiting 03/07/14 03/08/2014  . Non compliance with medical treatment 02/28/2014  . Dyslipidemia, goal LDL below 70 02/28/2014  . Bradycardia- beta blocker decreased 02/28/2014  . Chronic diastolic CHF (congestive heart failure) ([place]) 02/28/2014  . Type 2 diabetes mellitus with manifestations ([place]) 02/27/2014  . TIA (transient ischemic attack) 02/26/2014  . History of non-ST elevation myocardial infarction (NSTEMI) 02/26/2014  . Weakness-Left arm / Lef 12/06/2013  . Mesenteric artery stenosis ([facility] [facility]) 12/06/2013  . 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[place]) 12/03/2012  . Hypertension 11/10/2011  . History of CVA in adulthood 11/10/2011  . Language barrier 11/10/2011    Past Medical History:  Diagnosis Date  . CAD (coronary artery disease)    a. LHC (02/26/14):  mLAD 20 (faint L>R collats), mCFX 50, pRCA 95 (plaque rupture) >> PCI:  Xience DES to pRCA (severe tortuosity of R innominate artery >> needs L radial or FA in future);  b. LHC (03/10/14):  CFX 90, oOM 70, pRCA stent patent >> PCI:  Xience DES to mCFX   . Carotid stenosis    a. Carotid US (02/27/14):  Bilateral ICA 1-39%  . Depression   . Diabetes mellitus   . 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[place])    a. Mesenteric Artery Duplex (7/15):  pSMA chronic dissection with aneurysmal dilation of 1.29 cm (VVS);  b.  Chest CTA (02/26/14):  IMPRESSION:  1. No aortic dissection or other acute abnormality.  2. 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  3. Stable 17 mm ectasia of the left common iliac artery.  4. Atherosclerosis, including aortoiliac and coronary artery disease.   Marland Kitchen History of echocardiogram 09/2015   Echo 1/17: mod LVH, EF 55%, inf-lat HK, Gr 1 DD, mild LAE  . Hx of cardiovascular stress test    Lexiscan Myoview (2/16):  Inferior lateral defect c/w thinning vs small prior infarct, no ischemia, EF 49%, Low Risk  . Hx of echocardiogram    a.  Echocardiogram (02/27/14):  Mod focal basal and mild concentric LVH, EF 50-55%,  no RWMA, Gr 1 DD, mild TR, normal RVF  . Hyperlipidemia   . Hypertension   . Iliac artery aneurysm, left ([place])   . Myocardial infarction ([place])   . Stroke [facility]) 09/26/2009    Past Surgical History:  Procedure Laterality Date  . CARDIAC CATHETERIZATION    . CARDIAC CATHETERIZATION N/A 06/25/2015   Procedure: Left Heart Cath and Coronary Angiography;  Surgeon: Burnell Blanks, MD;  Location: [place] CV LAB;  Service: Cardiovascular;  Laterality: N/A;  . COLONOSCOPY WITH PROPOFOL N/A 07/23/2015   Procedure: COLONOSCOPY WITH PROPOFOL;  Surgeon: [name], MD;  Location: [facility] ENDOSCOPY;  Service: Endoscopy;  Laterality: N/A;  . CORONARY ANGIOPLASTY    . LEFT HEART CATHETERIZATION WITH CORONARY ANGIOGRAM N/A 02/26/2014   Procedure: LEFT HEART CATHETERIZATION WITH CORONARY ANGIOGRAM;  Surgeon: Wellington Hampshire, MD;  Location: [place] CATH LAB;  Service: Cardiovascular;  Laterality: N/A;  . LEFT HEART CATHETERIZATION WITH CORONARY ANGIOGRAM N/A 03/10/2014   Procedure: LEFT HEART CATHETERIZATION WITH CORONARY ANGIOGRAM;  Surgeon: Jettie Booze, MD;  Location: [facility] CATH LAB;  Service: Cardiovascular;  Laterality: N/A;    Social History   Tobacco Use  . Smoking status: Former Smoker    Packs/day: 0.25    Years: 38.00    Pack years: 9.50    Types: Cigarettes    Last attempt to quit: 2008    Years since quitting: 11.3  . Smokeless tobacco: Never Used  Substance Use Topics  . Alcohol use: No    Alcohol/week: 0.0 oz  . Drug use: No    Family History  Problem Relation Age of Onset  . Diabetes Father   . Hypertension Father   . Heart attack Father   . Stroke Neg Hx     No Known Allergies  Medication list has been reviewed and updated.  Current Outpatient Medications on File Prior to Visit  Medication Sig Dispense Refill  . Alirocumab (PRALUENT) 75 MG/ML SOPN Inject 1 pen into the skin every 14 (fourteen) days. 2 pen 11  . amLODipine (NORVASC) 5 MG tablet Take 1 tablet (5  mg total) by mouth daily. 90 tablet 3  . aspirin EC 81 MG EC tablet Take 1 tablet (81 mg total) by mouth daily.    Marland Kitchen gabapentin (NEURONTIN) 300 MG capsule Take 1 capsule (300 mg total) by mouth at bedtime. 90 capsule 3  . liraglutide 18 MG/3ML SOPN Inject 1.2 mg into the skin daily.    Marland Kitchen lisinopril (PRINIVIL,ZESTRIL) 10 MG tablet Take 1 tablet (10 mg total) by mouth daily. 30 tablet 11  . metFORMIN (GLUCOPHAGE) 1000 MG tablet Take 1 tablet (1,000 mg total) by mouth 2 (two) times daily with a meal. 180 tablet 3  .  nitroGLYCERIN (NITROSTAT) 0.4 MG SL tablet Place 1 tablet (0.4 mg total) under the tongue every 5 (five) minutes as needed for chest pain. 25 tablet 1  . rosuvastatin (CRESTOR) 40 MG tablet Take 1 tablet (40 mg total) by mouth daily. 90 tablet 3  . traZODone (DESYREL) 50 MG tablet Take 0.5-1 tablets (25-50 mg total) by mouth at bedtime as needed for sleep. 30 tablet 3   No current facility-administered medications on file prior to visit.     Review of Systems:  As per HPI- otherwise negative. No fever or chills Denies any other concerns today    Physical Examination: Vitals:   02/12/18 0920  BP: 120/60  Pulse: 60  Resp: 16  SpO2: 95%   Vitals:   02/12/18 0920  Weight: 161 lb 3.2 oz (73.1 kg)  Height: 5' 9"  (1.753 m)   Body mass index is 23.81 kg/m. Ideal Body Weight: Weight in (lb) to have BMI = 25: 168.9  GEN: WDWN, NAD, Non-toxic, A & O x 3 HEENT: Atraumatic, Normocephalic. Neck supple. No masses, No LAD. Ears and Nose: No external deformity. CV: RRR, No M/G/R. No JVD. No thrill. No extra heart sounds. PULM: CTA B, no wheezes, crackles, rhonchi. No retractions. No resp. distress. No accessory muscle use. ABD: S, NT, ND, +BS. No rebound. No HSM. EXTR: No c/c/e NEURO Normal gait.  PSYCH: Normally interactive. Conversant. Not depressed or anxious appearing.  Calm demeanor.   Foot exam today is normal  Assessment and Plan: Essential hypertension - Plan:  Comprehensive metabolic panel  Dyslipidemia  Language barrier  Type 2 diabetes mellitus with complication, without long-term current use of insulin (HCC) - Plan: Hemoglobin A1c, Comprehensive metabolic panel  Hypercalcemia - Plan: Comprehensive metabolic panel, CANCELED: Basic metabolic panel  Following up today Barriers to care as above- spoke with his son on the phone due to language barrier Labs pending They report good compliance with DM meds Encouraged eye exam Will plan to see in 6 months assuming labs are ok   Signed Lamar Blinks, MD

## 2018-02-12 ENCOUNTER — Encounter: Payer: Self-pay | Admitting: Family Medicine

## 2018-02-12 ENCOUNTER — Ambulatory Visit (INDEPENDENT_AMBULATORY_CARE_PROVIDER_SITE_OTHER): Payer: Medicare Other | Admitting: Family Medicine

## 2018-02-12 VITALS — BP 120/60 | HR 60 | Resp 16 | Ht 69.0 in | Wt 161.2 lb

## 2018-02-12 DIAGNOSIS — E118 Type 2 diabetes mellitus with unspecified complications: Secondary | ICD-10-CM | POA: Diagnosis not present

## 2018-02-12 DIAGNOSIS — I2510 Atherosclerotic heart disease of native coronary artery without angina pectoris: Secondary | ICD-10-CM

## 2018-02-12 DIAGNOSIS — E785 Hyperlipidemia, unspecified: Secondary | ICD-10-CM

## 2018-02-12 DIAGNOSIS — Z789 Other specified health status: Secondary | ICD-10-CM

## 2018-02-12 DIAGNOSIS — I10 Essential (primary) hypertension: Secondary | ICD-10-CM | POA: Diagnosis not present

## 2018-02-12 LAB — COMPREHENSIVE METABOLIC PANEL
ALT: 17 U/L (ref 0–53)
AST: 16 U/L (ref 0–37)
Albumin: 3.9 g/dL (ref 3.5–5.2)
Alkaline Phosphatase: 69 U/L (ref 39–117)
BUN: 16 mg/dL (ref 6–23)
CO2: 31 mEq/L (ref 19–32)
Calcium: 9.4 mg/dL (ref 8.4–10.5)
Chloride: 103 mEq/L (ref 96–112)
Creatinine, Ser: 1.04 mg/dL (ref 0.40–1.50)
GFR: 75.47 mL/min (ref >60.00)
Glucose, Bld: 177 mg/dL — ABNORMAL HIGH (ref 70–99)
Potassium: 4.5 mEq/L (ref 3.5–5.1)
Sodium: 140 mEq/L (ref 135–145)
Total Bilirubin: 0.6 mg/dL (ref 0.2–1.2)
Total Protein: 6.5 g/dL (ref 6.0–8.3)

## 2018-02-12 LAB — HEMOGLOBIN A1C: Hgb A1c MFr Bld: 9.6 % — ABNORMAL HIGH (ref 4.6–6.5)

## 2018-02-12 NOTE — Patient Instructions (Signed)
Please get your eye exam soon- this should be done every year I will check your A1c for your diabetes today  Take care and please see me in 6 months

## 2018-02-14 ENCOUNTER — Telehealth: Payer: Self-pay | Admitting: Pharmacist

## 2018-02-14 DIAGNOSIS — E782 Mixed hyperlipidemia: Secondary | ICD-10-CM

## 2018-02-14 NOTE — Telephone Encounter (Signed)
LMOM for patient's son - needs lipid panel checked to assess efficacy of Praluent. Pt was planning on having PCP check lipids however this lab was not drawn at recent office visit.

## 2018-02-18 ENCOUNTER — Encounter: Payer: Self-pay | Admitting: Family Medicine

## 2018-02-22 NOTE — Telephone Encounter (Signed)
Tried to reach patient's son again re: lipid panel however pt did not answer and voicemail is full.

## 2018-02-28 ENCOUNTER — Other Ambulatory Visit: Payer: Self-pay | Admitting: Family Medicine

## 2018-02-28 DIAGNOSIS — E1140 Type 2 diabetes mellitus with diabetic neuropathy, unspecified: Secondary | ICD-10-CM

## 2018-02-28 DIAGNOSIS — Z794 Long term (current) use of insulin: Principal | ICD-10-CM

## 2018-03-05 ENCOUNTER — Telehealth: Payer: Self-pay | Admitting: Family Medicine

## 2018-03-05 DIAGNOSIS — E118 Type 2 diabetes mellitus with unspecified complications: Secondary | ICD-10-CM

## 2018-03-05 MED ORDER — GLUCOSE BLOOD VI STRP: ORAL_STRIP | 12 refills | Status: DC

## 2018-03-05 MED ORDER — LANCETS 30G MISC: 6 refills | Status: DC

## 2018-03-05 NOTE — Addendum Note (Signed)
Addended by: SUPPLE, MEGAN E on: 03/05/2018 11:21 AM   Modules accepted: Orders

## 2018-03-05 NOTE — Telephone Encounter (Signed)
Spoke with patient's son and scheduled labwork for tomorrow to assess Praluent efficacy. Of note, [facility] pharmacy has previously contacted office to report nonadherence with Praluent refills.

## 2018-03-05 NOTE — Telephone Encounter (Signed)
[name] requesting refills of Lancets and test strips(One Touch). Requested prescriptions not on current medication list.   LOV:02/12/18  Dr. Lorelei Pont  [facility] [address]

## 2018-03-05 NOTE — Telephone Encounter (Signed)
Copied from [place] [id number]. Topic: Quick Communication - See Telephone Encounter >> Mar 05, 2018 11:25 AM Conception Chancy, NT wrote: CRM for notification. See Telephone encounter for: 03/05/18.  Patient son is calling to request refills for the patient. States he is needing Lancets one touch 30mg  and one touch test strips. I did not see these on patient file. Please advise.   [facility] ([address]), [place] [address] [place] ([place]) [place] [address] Phone: [phone] Fax: [phone]

## 2018-03-06 ENCOUNTER — Other Ambulatory Visit: Payer: Medicare Other

## 2018-03-09 ENCOUNTER — Other Ambulatory Visit: Payer: Medicare Other

## 2018-03-12 NOTE — Telephone Encounter (Signed)
Have spoken with patient's son twice and scheduled lipid panel for pt the following day and pt has not shown either time.

## 2018-03-15 ENCOUNTER — Ambulatory Visit: Payer: Medicare Other | Admitting: Family Medicine

## 2018-03-17 NOTE — Progress Notes (Signed)
[place] at [facility] [address], [place],  [address] [phone]  Date:  03/19/2018   Name:  Barry [name]   DOB:  January 01, 1951   MRN:  [id number]  PCP:  Darreld Mclean, MD    Chief Complaint: Medication Management (metformin causing diarrhea, 3 days, needing refill on test strips and lancets)   History of Present Illness:  Barry [name] is a 67 y.o. very pleasant male patient who presents with the following:  Here today to discus medications. We have had some problems in the past due to language barrier; his children would help with communication but I often suspected that they did not really know what meds he is taking/ if he is taking his meds I have asked him to bring in his meds and schedule a 30 minute appt with me todya  Lab Results  Component Value Date   HGBA1C 9.6 (H) 02/12/2018   History of CAD, CHF, DM, NSTEMI, dyslipidemia, HTN, language barrier, stroke,chronic superior mesenteric artery dissection and left common iliac artery penetrating ulcer This is a challenging patient in part due to language barrier and non- compliance, as well as concern about SE of his meds.  Today would like to try and determine what DM meds he is actually taking and adjust to achieve better A1c control. Also it looks like he has no -showed for lipids a couple of times [place] would like to get lipids today as well   Today they have an interpreter with them but do not have his medications  They feel that metformin is causing constipation and he may not have a BM for 4-5 days following using this He is not taking this med He has a diabetes injection that he is to take daily- liraglutide- however after some back and forth it seems that he is not taking this.   He is however taking his Praluent for hyperlipidemia They report checking blood sugars at home, say that his most recent glucose was 137 but do not otherwise have any blood sugars recorded  for her today He has not noted any low blood sugars He is exercising by walking and riding a bike    Patient Active Problem List   Diagnosis Date Noted  . Atherosclerotic peripheral vascular disease with ulceration ([place]) 04/03/2015  . Coronary artery disease involving native coronary artery of native heart without angina pectoris 03/28/2014  . Aneurysm of iliac artery ([place]) 03/18/2014  . Abdominal pain, unspecified site 03/18/2014  . Syncope in setting of nausea and vomiting 03/07/14 03/08/2014  . Non compliance with medical treatment 02/28/2014  . Dyslipidemia, goal LDL below 70 02/28/2014  . Bradycardia- beta blocker decreased 02/28/2014  . Chronic diastolic CHF (congestive heart failure) ([place]) 02/28/2014  . Type 2 diabetes mellitus with manifestations ([place]) 02/27/2014  . TIA (transient ischemic attack) 02/26/2014  . History of non-ST elevation myocardial infarction (NSTEMI) 02/26/2014  . Weakness-Left arm / Lef 12/06/2013  . Mesenteric artery stenosis ([place]) 12/06/2013  . 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[place]) 12/03/2012  . Hypertension 11/10/2011  . History of CVA in adulthood 11/10/2011  . Language barrier 11/10/2011    Past Medical History:  Diagnosis Date  . CAD (coronary artery disease)    a. LHC (02/26/14):  mLAD 20 (faint L>R collats), mCFX 50, pRCA 95 (plaque rupture) >> PCI:  Xience DES to pRCA (severe tortuosity of R innominate artery >> needs L radial or FA in future);  b.  LHC (03/10/14):  CFX 90, oOM 70, pRCA stent patent >> PCI:  Xience DES to [place]   . Carotid stenosis    a. Carotid US (02/27/14):  Bilateral ICA 1-39%  . Depression   . Diabetes mellitus   . 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[place])    a. Mesenteric Artery Duplex (7/15):  pSMA chronic dissection with aneurysmal dilation of 1.29 cm (VVS);  b.  Chest CTA (02/26/14):  IMPRESSION:  1. No aortic dissection or other acute abnormality.  2. 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  3. Stable 17 mm ectasia of  the left common iliac artery.  4. Atherosclerosis, including aortoiliac and coronary artery disease.   Marland Kitchen History of echocardiogram 09/2015   Echo 1/17: mod LVH, EF 55%, inf-lat HK, Gr 1 DD, mild LAE  . Hx of cardiovascular stress test    Lexiscan Myoview (2/16):  Inferior lateral defect c/w thinning vs small prior infarct, no ischemia, EF 49%, Low Risk  . Hx of echocardiogram    a.  Echocardiogram (02/27/14):  Mod focal basal and mild concentric LVH, EF 50-55%, no RWMA, Gr 1 DD, mild TR, normal RVF  . Hyperlipidemia   . Hypertension   . Iliac artery aneurysm, left ([place])   . Myocardial infarction ([place])   . Stroke [facility]) 09/26/2009    Past Surgical History:  Procedure Laterality Date  . CARDIAC CATHETERIZATION    . CARDIAC CATHETERIZATION N/A 06/25/2015   Procedure: Left Heart Cath and Coronary Angiography;  Surgeon: Burnell Blanks, MD;  Location: [place] CV LAB;  Service: Cardiovascular;  Laterality: N/A;  . COLONOSCOPY WITH PROPOFOL N/A 07/23/2015   Procedure: COLONOSCOPY WITH PROPOFOL;  Surgeon: [name], MD;  Location: [facility] ENDOSCOPY;  Service: Endoscopy;  Laterality: N/A;  . CORONARY ANGIOPLASTY    . LEFT HEART CATHETERIZATION WITH CORONARY ANGIOGRAM N/A 02/26/2014   Procedure: LEFT HEART CATHETERIZATION WITH CORONARY ANGIOGRAM;  Surgeon: Wellington Hampshire, MD;  Location: [place] CATH LAB;  Service: Cardiovascular;  Laterality: N/A;  . LEFT HEART CATHETERIZATION WITH CORONARY ANGIOGRAM N/A 03/10/2014   Procedure: LEFT HEART CATHETERIZATION WITH CORONARY ANGIOGRAM;  Surgeon: Jettie Booze, MD;  Location: [facility] CATH LAB;  Service: Cardiovascular;  Laterality: N/A;    Social History   Tobacco Use  . Smoking status: Former Smoker    Packs/day: 0.25    Years: 38.00    Pack years: 9.50    Types: Cigarettes    Last attempt to quit: 2008    Years since quitting: 11.4  . Smokeless tobacco: Never Used  Substance Use Topics  . Alcohol use: No    Alcohol/week: 0.0 oz  . Drug  use: No    Family History  Problem Relation Age of Onset  . Diabetes Father   . Hypertension Father   . Heart attack Father   . Stroke Neg Hx     No Known Allergies  Medication list has been reviewed and updated.  Current Outpatient Medications on File Prior to Visit  Medication Sig Dispense Refill  . Alirocumab (PRALUENT) 75 MG/ML SOPN Inject 1 pen into the skin every 14 (fourteen) days. 2 pen 11  . amLODipine (NORVASC) 5 MG tablet Take 1 tablet (5 mg total) by mouth daily. 90 tablet 3  . aspirin EC 81 MG EC tablet Take 1 tablet (81 mg total) by mouth daily.    Marland Kitchen gabapentin (NEURONTIN) 300 MG capsule Take 1 capsule (300 mg total) by mouth at bedtime. 90 capsule 3  .  glucose blood test strip Use as instructed to test glucose up to twice times daily 100 each 12  . Lancets 30G MISC Use to check glucose up to TID 100 each 6  . liraglutide 18 MG/3ML SOPN Inject 1.2 mg into the skin daily.    Marland Kitchen lisinopril (PRINIVIL,ZESTRIL) 10 MG tablet Take 1 tablet (10 mg total) by mouth daily. 30 tablet 11  . nitroGLYCERIN (NITROSTAT) 0.4 MG SL tablet Place 1 tablet (0.4 mg total) under the tongue every 5 (five) minutes as needed for chest pain. 25 tablet 1  . rosuvastatin (CRESTOR) 40 MG tablet Take 1 tablet (40 mg total) by mouth daily. 90 tablet 3  . traZODone (DESYREL) 50 MG tablet Take 0.5-1 tablets (25-50 mg total) by mouth at bedtime as needed for sleep. 30 tablet 3  . metFORMIN (GLUCOPHAGE) 1000 MG tablet Take 1 tablet (1,000 mg total) by mouth 2 (two) times daily with a meal. (Patient not taking: Reported on 03/19/2018) 180 tablet 3   No current facility-administered medications on file prior to visit.     Review of Systems:  As per HPI- otherwise negative. He reports that he exercises by walking and using an exercise bike  No fever or chills  Physical Examination: Vitals:   03/19/18 0927  BP: 128/70  Pulse: (!) 57  Resp: 16  SpO2: 97%   Vitals:   03/19/18 0927  Weight: 155 lb  9.6 oz (70.6 kg)  Height: 5\' 9"  (1.753 m)   Body mass index is 22.98 kg/m. Ideal Body Weight: Weight in (lb) to have BMI = 25: 168.9  GEN: WDWN, NAD, Non-toxic, A & O x 3 HEENT: Atraumatic, Normocephalic. Neck supple. No masses, No LAD.  Bilateral TM wnl, oropharynx normal.  PEERL,EOMI.   Ears and Nose: No external deformity. CV: RRR, No M/G/R. No JVD. No thrill. No extra heart sounds. PULM: CTA B, no wheezes, crackles, rhonchi. No retractions. No resp. distress. No accessory muscle use. ABD: S, NT, ND, +BS. No rebound. No HSM. EXTR: No c/c/e NEURO Normal gait.  PSYCH: Normally interactive. Conversant. Not depressed or anxious appearing.  Calm demeanor.  Normal weight, appears well   Assessment and Plan: Type 2 diabetes mellitus with complication, without long-term current use of insulin (HCC) - Plan: liraglutide (VICTOZA) 18 MG/3ML SOPN, ONETOUCH DELICA LANCETS FINE MISC, glucose blood (ONE TOUCH ULTRA TEST) test strip  Essential hypertension  Dyslipidemia - Plan: Lipid panel  Non compliance with medical treatment  Following up today He is not taking any DM meds at this time He feels very convinced that metformin is causing constipation Stretch will DC this drug.  Refilled victoza and asked him to go back on this. Also asked him to start recording his blood sugars at home Asked pt to return to clinic in 2 months.  Please bring medications.  Explained to him that I cannot help get his diabetes under control effectively without knowing what meds he is taking  Interpreter used today which was very helpful   Signed Lamar Blinks, MD

## 2018-03-19 ENCOUNTER — Ambulatory Visit (INDEPENDENT_AMBULATORY_CARE_PROVIDER_SITE_OTHER): Payer: Medicare Other | Admitting: Family Medicine

## 2018-03-19 ENCOUNTER — Encounter: Payer: Self-pay | Admitting: Family Medicine

## 2018-03-19 VITALS — BP 128/70 | HR 57 | Resp 16 | Ht 69.0 in | Wt 155.6 lb

## 2018-03-19 DIAGNOSIS — I10 Essential (primary) hypertension: Secondary | ICD-10-CM

## 2018-03-19 DIAGNOSIS — E118 Type 2 diabetes mellitus with unspecified complications: Secondary | ICD-10-CM

## 2018-03-19 DIAGNOSIS — E785 Hyperlipidemia, unspecified: Secondary | ICD-10-CM

## 2018-03-19 DIAGNOSIS — I2510 Atherosclerotic heart disease of native coronary artery without angina pectoris: Secondary | ICD-10-CM

## 2018-03-19 DIAGNOSIS — Z9119 Patient's noncompliance with other medical treatment and regimen: Secondary | ICD-10-CM

## 2018-03-19 DIAGNOSIS — Z91199 Patient's noncompliance with other medical treatment and regimen due to unspecified reason: Secondary | ICD-10-CM

## 2018-03-19 LAB — LIPID PANEL
Cholesterol: 140 mg/dL (ref 0–200)
HDL: 50 mg/dL (ref >39.00)
LDL Cholesterol: 56 mg/dL (ref 0–99)
NonHDL: 90.02
Total CHOL/HDL Ratio: 3
Triglycerides: 168 mg/dL — ABNORMAL HIGH (ref 0.0–149.0)
VLDL: 33.6 mg/dL (ref 0.0–40.0)

## 2018-03-19 MED ORDER — GLUCOSE BLOOD VI STRP: ORAL_STRIP | 12 refills | Status: DC

## 2018-03-19 MED ORDER — ONETOUCH DELICA LANCETS FINE MISC: 99 refills | Status: DC

## 2018-03-19 MED ORDER — LIRAGLUTIDE 18 MG/3ML ~~LOC~~ SOPN: PEN_INJECTOR | SUBCUTANEOUS | 3 refills | Status: DC

## 2018-03-19 NOTE — Patient Instructions (Addendum)
Please see me in 2 months  PLEASE bring your medications to your next visit  Please check your blood sugar twice a day and bring in your record to your next visit  We are going to start you back on Victoza (liraglutde) for your diabetes. This is a once a day shot. You will start on 0.6 mg and then increase to 1.2 mg after a week.

## 2018-03-20 ENCOUNTER — Other Ambulatory Visit: Payer: Self-pay

## 2018-03-20 DIAGNOSIS — E118 Type 2 diabetes mellitus with unspecified complications: Secondary | ICD-10-CM

## 2018-04-10 ENCOUNTER — Ambulatory Visit: Payer: Self-pay | Admitting: *Deleted

## 2018-04-10 NOTE — Telephone Encounter (Signed)
Using an interpreter, [place] (ID [id number]), called patient back regarding a message for body pain and needing pain medication. Pt stated that his pain was gone and now he is having some hoarseness that started about 2 months ago and now feels like it is getting worse. He thinks it is from his diabetes medication (liraglutide). Pt is on lisinopril (side effect :hoarseness).  His wife also states that he c/o feeling weak,bloated and having constipation. [place] he stop taking his diabetes medication for a week already. He has not been checking his blood sugar everyday.  Checking his blood sugar now it is 186 and last ate about 45 mins ago. Attempted to make an appointment for this week, but pcp is full. Offered another provider [place] he could be seen this week, but wife only wants to see Dr. Lorelei Pont.  Tricia (flow) at [facility] notified regarding patient.  Appointment scheduled for Monday with his pcp.  Advised wife that the patient needs to check his blood sugar everyday and write them down to bring to the office along with his medications regardless whether or not he is taking it.  If he starts demonstrating other symptoms: blurred vision, frequent urination, nausea, vomiting, increased thirst, weakness and blood sugar 300 or greater then he needs to be seen at the emergency department. Advised patient to drink plenty of water also. She voiced understanding. Also to call back if he decides that he needs to be seen sooner than Monday.   Reason for Disposition . [1] Hoarseness starting > 24 hours ago AND [2] taking an ACE Inhibitor medication (e.g., benazepril/LOTENSIN, captopril/CAPOTEN, enalapril/VASOTEC, lisinopril/ZESTRIL)  Answer Assessment - Initial Assessment Questions 1. DESCRIPTION: "Describe your voice."     hoarse 2. ONSET: "When did the hoarseness begin?"     Over 3 or 4 months but this week has gotten worse 3. COUGH: "Is there a cough?" If [place], ask: "How bad?"     no 4. FEVER:  "Do you have a fever?" If [place], ask: "What is your temperature, how was it measured, and when did it start?"     no 5. RESPIRATORY STATUS: "Describe your breathing."      Breathing  6. ALLERGIES: "Any allergy symptoms?" If [place], ask: "What are they?"     no 7. IRRITANTS: "Do you smoke?" "Have you been exposed to any irritating fumes?"     no 8. CAUSE: "What do you think is causing the hoarseness?"     Assuming from new medications 9. OTHER SYMPTOMS: "Do you have any other symptoms?" (e.g., sore throat, swelling, foreign body, rash)     no  Protocols used: HOARSENESS-A-AH

## 2018-04-14 NOTE — Progress Notes (Signed)
[facility] at [facility] [employer] [place], [place], [place] [address] [phone]  Date:  04/16/2018   Name:  Barry [name]   DOB:  06-29-1951   MRN:  [id number]  PCP:  Darreld Mclean, MD    Chief Complaint: Barry [name] (couple days, fatigue, no fever)   History of Present Illness:  Barry [name] is a 67 y.o. very pleasant male patient who presents with the following:  Here today to discuss hoarseness for 2 months Last seen here in June: He is not taking any DM meds at this time He feels very convinced that metformin is causing constipation [place] will DC this drug.  Refilled victoza and asked him to go back on this. Also asked him to start recording his blood sugars at home Asked pt to return to clinic in 2 months.  Please bring medications.  Explained to him that I cannot help get his diabetes under control effectively without knowing what meds he is taking  Interpreter used today which was very helpful   Lab Results  Component Value Date   HGBA1C 9.6 (H) 02/12/2018   Here today with his wife and interpreter He did bring his medications today He notes that he had been constipated for several days, but he did have 2 BM yesterday after using some sort of powder, they are not sure what he took. ?miralax  He says that his stomach feels "warm inside" but is not painful. No vomiting.  He is feeling better today He did take some we think miralax for a similar issue at some point in the past but he cannot remember when   He takes both insulin and victoza every day- however later in the visit he stated that he stopped taking both about 2 days ago for reasons that are not entirely clear to me.   He also stopped metformin a few months ago as it seemed to cause "constipaton" He feels that victoza makes him feel tired.   He feels like his medications don't work anyway and that his glucose is still high  At this time I explained that I don't know how to help  him with his diabetes as he will not take any medication that I rx for him. He would like to treat his diabetes. I will refer to endocrinology in case they can be more helpful.  Will have him start on glipizide 5 mg for now He would like to see endocrinology in [place] as this is most convenient for him   BP is up today- he did not take his BP meds for a couple of days due to feeling bloated.   Asked him to go back on his BP medications  BP Readings from Last 3 Encounters:  04/16/18 (!) 160/86  03/19/18 128/70  02/12/18 120/60   He is using both crestor and Praluent we think- I will check with PharmD at cardiology to see if he needs both.  I think he can stop the crestor - he will stop for now while I query his pharmacist  [name] continues to be very difficult to care for as he stops and starts his meds as he sees fit and attributes side effects to different medications at different times.   Patient Active Problem List   Diagnosis Date Noted  . Atherosclerotic peripheral vascular disease with ulceration ([place]) 04/03/2015  . Coronary artery disease involving native coronary artery of native heart without angina pectoris 03/28/2014  .  Aneurysm of iliac artery ([place]) 03/18/2014  . Abdominal pain, unspecified site 03/18/2014  . Syncope in setting of nausea and vomiting 03/07/14 03/08/2014  . Non compliance with medical treatment 02/28/2014  . Dyslipidemia, goal LDL below 70 02/28/2014  . Bradycardia- beta blocker decreased 02/28/2014  . Chronic diastolic CHF (congestive heart failure) ([place]) 02/28/2014  . Type 2 diabetes mellitus with manifestations ([place]) 02/27/2014  . TIA (transient ischemic attack) 02/26/2014  . History of non-ST elevation myocardial infarction (NSTEMI) 02/26/2014  . Weakness-Left arm / Lef 12/06/2013  . Mesenteric artery stenosis ([place]) 12/06/2013  . 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[place]) 12/03/2012  . Hypertension 11/10/2011  . History of CVA in adulthood 11/10/2011  .  Language barrier 11/10/2011    Past Medical History:  Diagnosis Date  . CAD (coronary artery disease)    a. LHC (02/26/14):  mLAD 20 (faint L>R collats), mCFX 50, pRCA 95 (plaque rupture) >> PCI:  Xience DES to pRCA (severe tortuosity of R innominate artery >> needs L radial or FA in future);  b. LHC (03/10/14):  CFX 90, oOM 70, pRCA stent patent >> PCI:  Xience DES to mCFX   . Carotid stenosis    a. Carotid US (02/27/14):  Bilateral ICA 1-39%  . Depression   . Diabetes mellitus   . 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[place])    a. Mesenteric Artery Duplex (7/15):  pSMA chronic dissection with aneurysmal dilation of 1.29 cm (VVS);  b.  Chest CTA (02/26/14):  IMPRESSION:  1. No aortic dissection or other acute abnormality.  2. 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  3. Stable 17 mm ectasia of the left common iliac artery.  4. Atherosclerosis, including aortoiliac and coronary artery disease.   Marland Kitchen History of echocardiogram 09/2015   Echo 1/17: mod LVH, EF 55%, inf-lat HK, Gr 1 DD, mild LAE  . Hx of cardiovascular stress test    Lexiscan Myoview (2/16):  Inferior lateral defect c/w thinning vs small prior infarct, no ischemia, EF 49%, Low Risk  . Hx of echocardiogram    a.  Echocardiogram (02/27/14):  Mod focal basal and mild concentric LVH, EF 50-55%, no RWMA, Gr 1 DD, mild TR, normal RVF  . Hyperlipidemia   . Hypertension   . Iliac artery aneurysm, left ([place])   . Myocardial infarction ([place])   . Stroke [facility]) 09/26/2009    Past Surgical History:  Procedure Laterality Date  . CARDIAC CATHETERIZATION    . CARDIAC CATHETERIZATION N/A 06/25/2015   Procedure: Left Heart Cath and Coronary Angiography;  Surgeon: Burnell Blanks, MD;  Location: [facility] CV LAB;  Service: Cardiovascular;  Laterality: N/A;  . COLONOSCOPY WITH PROPOFOL N/A 07/23/2015   Procedure: COLONOSCOPY WITH PROPOFOL;  Surgeon: [name], MD;  Location: [facility] ENDOSCOPY;  Service: Endoscopy;  Laterality: N/A;  .  CORONARY ANGIOPLASTY    . LEFT HEART CATHETERIZATION WITH CORONARY ANGIOGRAM N/A 02/26/2014   Procedure: LEFT HEART CATHETERIZATION WITH CORONARY ANGIOGRAM;  Surgeon: Wellington Hampshire, MD;  Location: [facility] CATH LAB;  Service: Cardiovascular;  Laterality: N/A;  . LEFT HEART CATHETERIZATION WITH CORONARY ANGIOGRAM N/A 03/10/2014   Procedure: LEFT HEART CATHETERIZATION WITH CORONARY ANGIOGRAM;  Surgeon: Jettie Booze, MD;  Location: [facility] C[facility] LAB;  Service: Cardiovascular;  Laterality: N/A;    Social History   Tobacco Use  . Smoking status: Former Smoker    Packs/day: 0.25    Years: 38.00    Pack years: 9.50    Types: Cigarettes  Last attempt to quit: 2008    Years since quitting: 11.5  . Smokeless tobacco: Never Used  Substance Use Topics  . Alcohol use: No    Alcohol/week: 0.0 oz  . Drug use: No    Family History  Problem Relation Age of Onset  . Diabetes Father   . Hypertension Father   . Heart attack Father   . Stroke Neg Hx     Allergies  Allergen Reactions  . Metformin And Related     Pt feels that this causes constipation and will not take     Medication list has been reviewed and updated.  Current Outpatient Medications on File Prior to Visit  Medication Sig Dispense Refill  . Alirocumab (PRALUENT) 75 MG/ML SOPN Inject 1 pen into the skin every 14 (fourteen) days. 2 pen 11  . amLODipine (NORVASC) 5 MG tablet Take 1 tablet (5 mg total) by mouth daily. 90 tablet 3  . aspirin EC 81 MG EC tablet Take 1 tablet (81 mg total) by mouth daily.    . diclofenac (VOLTAREN) 75 MG EC tablet Take 75 mg by mouth 2 (two) times daily.    Marland Kitchen gabapentin (NEURONTIN) 300 MG capsule Take 1 capsule (300 mg total) by mouth at bedtime. 90 capsule 3  . glucose blood (ONE TOUCH ULTRA TEST) test strip Use to test blood sugar up to twice a day 100 each 12  . glucose blood test strip Use as instructed to test glucose up to twice times daily 100 each 12  . insulin detemir (LEVEMIR) 100 UNIT/ML  injection Inject into the skin daily.    . Lancets 30G MISC Use to check glucose up to TID 100 each 6  . liraglutide (VICTOZA) 18 MG/3ML SOPN Take 0.6 mg  daily for one week, then increase to 1.2 mg 15 mL 3  . lisinopril (PRINIVIL,ZESTRIL) 10 MG tablet Take 1 tablet (10 mg total) by mouth daily. 30 tablet 11  . nitroGLYCERIN (NITROSTAT) 0.4 MG SL tablet Place 1 tablet (0.4 mg total) under the tongue every 5 (five) minutes as needed for chest pain. 25 tablet 1  . ONETOUCH DELICA LANCETS FINE MISC Use to test blood sugar up to twice a day 100 each prn  . rosuvastatin (CRESTOR) 40 MG tablet Take 1 tablet (40 mg total) by mouth daily. 90 tablet 3  . traZODone (DESYREL) 50 MG tablet Take 0.5-1 tablets (25-50 mg total) by mouth at bedtime as needed for sleep. 30 tablet 3   No current facility-administered medications on file prior to visit.     Review of Systems:  As per HPI- otherwise negative.   Physical Examination: Vitals:   04/16/18 1025  BP: (!) 160/86  Pulse: 62  Resp: 16  SpO2: 98%   Vitals:   04/16/18 1025  Weight: 156 lb 3.2 oz (70.9 kg)  Height: 5\' 9"  (1.753 m)   Body mass index is 23.07 kg/m. Ideal Body Weight: Weight in (lb) to have BMI = 25: 168.9  GEN: WDWN, NAD, Non-toxic, A & O x 3, normal weight, looks well  HEENT: Atraumatic, Normocephalic. Neck supple. No masses, No LAD. Ears and Nose: No external deformity. CV: RRR, No M/G/R. No JVD. No thrill. No extra heart sounds. PULM: CTA B, no wheezes, crackles, rhonchi. No retractions. No resp. distress. No accessory muscle use. ABD: S, NT, ND, +BS. No rebound. No HSM. EXTR: No c/c/e NEURO Normal gait.  PSYCH: Normally interactive. Conversant. Not depressed or anxious appearing.  Calm demeanor. ;  l   Assessment and Plan: Type 2 diabetes mellitus with complication, without long-term current use of insulin ([place]) - Plan: Ambulatory referral to Endocrinology, glipiZIDE (GLUCOTROL) 5 MG tablet, Basic metabolic panel,  Hemoglobin A1c  Essential hypertension  Non compliance with medical treatment  Dyslipidemia  Language barrier  Following up today Visit accomplished with interpreter today. [place] did bring in his meds today which we appreciate.  It is still not very clear what he is taking, but in the end it seems that he is not taking insulin or victoza.  However he is taking praluent He also stopped taking his BP meds He is ?not taking gabapentin due to making him feel tired  Will obtain labs today to see where we are Referral to endocrinology as I have been unable to find a DM regimen that he can tolerate   Signed Lamar Blinks, MD  Received his labs from today- his A1c is a bit better.,  However he has now decided to stop both victoza and levemir due to "feeling tired."  I have started glipizide for now and referred to endocrinology   Results for orders placed or performed in visit on 81/77/11  Basic metabolic panel  Result Value Ref Range   Sodium 139 135 - 145 mEq/L   Potassium 4.6 3.5 - 5.1 mEq/L   Chloride 103 96 - 112 mEq/L   CO2 29 19 - 32 mEq/L   Glucose, Bld 187 (H) 70 - 99 mg/dL   BUN 24 (H) 6 - 23 mg/dL   Creatinine, Ser 1.20 0.40 - 1.50 mg/dL   Calcium 9.1 8.4 - 10.5 mg/dL   GFR 63.95 >60.00 mL/min  Hemoglobin A1c  Result Value Ref Range   Hgb A1c MFr Bld 8.9 (H) 4.6 - 6.5 %

## 2018-04-16 ENCOUNTER — Encounter: Payer: Self-pay | Admitting: Family Medicine

## 2018-04-16 ENCOUNTER — Ambulatory Visit (INDEPENDENT_AMBULATORY_CARE_PROVIDER_SITE_OTHER): Payer: Medicare Other | Admitting: Family Medicine

## 2018-04-16 VITALS — BP 160/86 | HR 62 | Resp 16 | Ht 69.0 in | Wt 156.2 lb

## 2018-04-16 DIAGNOSIS — Z789 Other specified health status: Secondary | ICD-10-CM | POA: Diagnosis not present

## 2018-04-16 DIAGNOSIS — I2510 Atherosclerotic heart disease of native coronary artery without angina pectoris: Secondary | ICD-10-CM

## 2018-04-16 DIAGNOSIS — E785 Hyperlipidemia, unspecified: Secondary | ICD-10-CM

## 2018-04-16 DIAGNOSIS — Z91199 Patient's noncompliance with other medical treatment and regimen due to unspecified reason: Secondary | ICD-10-CM

## 2018-04-16 DIAGNOSIS — I10 Essential (primary) hypertension: Secondary | ICD-10-CM

## 2018-04-16 DIAGNOSIS — E118 Type 2 diabetes mellitus with unspecified complications: Secondary | ICD-10-CM

## 2018-04-16 DIAGNOSIS — Z9119 Patient's noncompliance with other medical treatment and regimen: Secondary | ICD-10-CM

## 2018-04-16 LAB — BASIC METABOLIC PANEL
BUN: 24 mg/dL — ABNORMAL HIGH (ref 6–23)
CO2: 29 mEq/L (ref 19–32)
Calcium: 9.1 mg/dL (ref 8.4–10.5)
Chloride: 103 mEq/L (ref 96–112)
Creatinine, Ser: 1.2 mg/dL (ref 0.40–1.50)
GFR: 63.95 mL/min (ref >60.00)
Glucose, Bld: 187 mg/dL — ABNORMAL HIGH (ref 70–99)
Potassium: 4.6 mEq/L (ref 3.5–5.1)
Sodium: 139 mEq/L (ref 135–145)

## 2018-04-16 LAB — HEMOGLOBIN A1C: Hgb A1c MFr Bld: 8.9 % — ABNORMAL HIGH (ref 4.6–6.5)

## 2018-04-16 MED ORDER — GLIPIZIDE 5 MG PO TABS
5.0000 mg | ORAL_TABLET | Freq: Every day | ORAL | 6 refills | Status: DC
Start: 2018-04-16 — End: 2018-06-18

## 2018-04-16 NOTE — Patient Instructions (Signed)
I am going to refer you to see endocrinology to help [place] with your diabetes.  For the time being we will have you try glipizide by mouth once a day.  You can use miralax once a day as needed for constipation  Please start taking your blood pressure medication- lisinopril and amlodipine- again today

## 2018-04-17 ENCOUNTER — Other Ambulatory Visit: Payer: Self-pay | Admitting: Family Medicine

## 2018-06-08 ENCOUNTER — Emergency Department (HOSPITAL_COMMUNITY): Payer: Medicare Other

## 2018-06-08 ENCOUNTER — Emergency Department (HOSPITAL_COMMUNITY)
Admission: EM | Admit: 2018-06-08 | Discharge: 2018-06-09 | Disposition: A | Discharge status: Home / Self Care | Payer: Medicare Other | Attending: Emergency Medicine | Admitting: Emergency Medicine

## 2018-06-08 DIAGNOSIS — R112 Nausea with vomiting, unspecified: Secondary | ICD-10-CM | POA: Diagnosis not present

## 2018-06-08 DIAGNOSIS — E119 Type 2 diabetes mellitus without complications: Secondary | ICD-10-CM | POA: Insufficient documentation

## 2018-06-08 DIAGNOSIS — I110 Hypertensive heart disease with heart failure: Secondary | ICD-10-CM | POA: Insufficient documentation

## 2018-06-08 DIAGNOSIS — Z87891 Personal history of nicotine dependence: Secondary | ICD-10-CM | POA: Insufficient documentation

## 2018-06-08 DIAGNOSIS — Z955 Presence of coronary angioplasty implant and graft: Secondary | ICD-10-CM | POA: Insufficient documentation

## 2018-06-08 DIAGNOSIS — I2510 Atherosclerotic heart disease of native coronary artery without angina pectoris: Secondary | ICD-10-CM | POA: Diagnosis not present

## 2018-06-08 DIAGNOSIS — J189 Pneumonia, unspecified organism: Secondary | ICD-10-CM | POA: Insufficient documentation

## 2018-06-08 DIAGNOSIS — R918 Other nonspecific abnormal finding of lung field: Secondary | ICD-10-CM | POA: Diagnosis not present

## 2018-06-08 DIAGNOSIS — Z79899 Other long term (current) drug therapy: Secondary | ICD-10-CM | POA: Insufficient documentation

## 2018-06-08 DIAGNOSIS — R000 Tachycardia, unspecified: Secondary | ICD-10-CM | POA: Diagnosis not present

## 2018-06-08 DIAGNOSIS — Z7982 Long term (current) use of aspirin: Secondary | ICD-10-CM | POA: Insufficient documentation

## 2018-06-08 DIAGNOSIS — I5032 Chronic diastolic (congestive) heart failure: Secondary | ICD-10-CM | POA: Insufficient documentation

## 2018-06-08 DIAGNOSIS — R05 Cough: Secondary | ICD-10-CM | POA: Diagnosis not present

## 2018-06-08 DIAGNOSIS — I10 Essential (primary) hypertension: Secondary | ICD-10-CM | POA: Diagnosis not present

## 2018-06-08 DIAGNOSIS — R1111 Vomiting without nausea: Secondary | ICD-10-CM | POA: Diagnosis not present

## 2018-06-08 LAB — URINALYSIS, ROUTINE W REFLEX MICROSCOPIC
Bacteria, UA: NONE SEEN
Bilirubin Urine: NEGATIVE
Glucose, UA: >=500 mg/dL — AB
Ketones, ur: NEGATIVE mg/dL
Leukocytes, UA: NEGATIVE
Nitrite: NEGATIVE
Protein, ur: NEGATIVE mg/dL
Specific Gravity, Urine: 1.008 (ref 1.005–1.030)
pH: 7 (ref 5.0–8.0)

## 2018-06-08 LAB — COMPREHENSIVE METABOLIC PANEL
ALT: 22 U/L (ref 0–44)
AST: 25 U/L (ref 15–41)
Albumin: 4.4 g/dL (ref 3.5–5.0)
Alkaline Phosphatase: 76 U/L (ref 38–126)
Anion gap: 9 (ref 5–15)
BUN: 16 mg/dL (ref 8–23)
CO2: 27 mmol/L (ref 22–32)
Calcium: 9.3 mg/dL (ref 8.9–10.3)
Chloride: 104 mmol/L (ref 98–111)
Creatinine, Ser: 1.13 mg/dL (ref 0.61–1.24)
GFR calc Af Amer: >60 mL/min (ref >60)
GFR calc non Af Amer: >60 mL/min (ref >60)
Glucose, Bld: 125 mg/dL — ABNORMAL HIGH (ref 70–99)
Potassium: 4.1 mmol/L (ref 3.5–5.1)
Sodium: 140 mmol/L (ref 135–145)
Total Bilirubin: 0.7 mg/dL (ref 0.3–1.2)
Total Protein: 7.6 g/dL (ref 6.5–8.1)

## 2018-06-08 LAB — CBC
HCT: 45.6 % (ref 39.0–52.0)
Hemoglobin: 14.9 g/dL (ref 13.0–17.0)
MCH: 29.2 pg (ref 26.0–34.0)
MCHC: 32.7 g/dL (ref 30.0–36.0)
MCV: 89.2 fL (ref 78.0–100.0)
Platelets: 188 10*3/uL (ref 150–400)
RBC: 5.11 MIL/uL (ref 4.22–5.81)
RDW: 13 % (ref 11.5–15.5)
WBC: 11.5 10*3/uL — ABNORMAL HIGH (ref 4.0–10.5)

## 2018-06-08 LAB — INFLUENZA PANEL BY PCR (TYPE A & B)
Influenza A By PCR: NEGATIVE
Influenza B By PCR: NEGATIVE

## 2018-06-08 MED ORDER — SODIUM CHLORIDE 0.9 % IV BOLUS
1000.0000 mL | Freq: Once | INTRAVENOUS | Status: AC
  Administered 2018-06-08: 1000 mL via INTRAVENOUS

## 2018-06-08 MED ORDER — ONDANSETRON HCL 4 MG/2ML IJ SOLN
4.0000 mg | Freq: Once | INTRAMUSCULAR | Status: AC
  Administered 2018-06-08: 4 mg via INTRAVENOUS
  Filled 2018-06-08: qty 2

## 2018-06-08 MED ORDER — MORPHINE SULFATE (PF) 4 MG/ML IV SOLN
4.0000 mg | Freq: Once | INTRAVENOUS | Status: AC
  Administered 2018-06-08: 4 mg via INTRAVENOUS
  Filled 2018-06-08: qty 1

## 2018-06-08 MED ORDER — LEVOFLOXACIN 500 MG PO TABS: 500.0000 mg | ORAL_TABLET | Freq: Every day | ORAL | 0 refills | Status: DC

## 2018-06-08 MED ORDER — LEVOFLOXACIN 500 MG PO TABS
500.0000 mg | ORAL_TABLET | Freq: Once | ORAL | Status: AC
  Administered 2018-06-08: 500 mg via ORAL
  Filled 2018-06-08: qty 1

## 2018-06-08 NOTE — ED Triage Notes (Signed)
Pt BIB [facility] from home. Pt has IV and has received 450 cc fluid and 4mg  of zofran given en route. Started 5 am today N/V/A. Has not vomited since Zofran with EMS.

## 2018-06-08 NOTE — ED Notes (Signed)
Bed: WA02 Expected date:  Expected time:  Means of arrival:  Comments: 67 yo N/V

## 2018-06-09 ENCOUNTER — Other Ambulatory Visit: Payer: Self-pay

## 2018-06-09 ENCOUNTER — Emergency Department (HOSPITAL_COMMUNITY): Payer: Medicare Other

## 2018-06-09 ENCOUNTER — Emergency Department (HOSPITAL_COMMUNITY)
Admission: EM | Admit: 2018-06-09 | Discharge: 2018-06-09 | Disposition: A | Discharge status: Home / Self Care | Payer: Medicare Other | Source: Home / Self Care | Attending: Emergency Medicine | Admitting: Emergency Medicine

## 2018-06-09 DIAGNOSIS — R531 Weakness: Secondary | ICD-10-CM | POA: Diagnosis not present

## 2018-06-09 DIAGNOSIS — J189 Pneumonia, unspecified organism: Secondary | ICD-10-CM

## 2018-06-09 DIAGNOSIS — R112 Nausea with vomiting, unspecified: Secondary | ICD-10-CM

## 2018-06-09 DIAGNOSIS — R1111 Vomiting without nausea: Secondary | ICD-10-CM | POA: Diagnosis not present

## 2018-06-09 DIAGNOSIS — E119 Type 2 diabetes mellitus without complications: Secondary | ICD-10-CM | POA: Insufficient documentation

## 2018-06-09 DIAGNOSIS — R61 Generalized hyperhidrosis: Secondary | ICD-10-CM | POA: Diagnosis not present

## 2018-06-09 DIAGNOSIS — Z87891 Personal history of nicotine dependence: Secondary | ICD-10-CM

## 2018-06-09 DIAGNOSIS — Z7984 Long term (current) use of oral hypoglycemic drugs: Secondary | ICD-10-CM | POA: Insufficient documentation

## 2018-06-09 DIAGNOSIS — R918 Other nonspecific abnormal finding of lung field: Secondary | ICD-10-CM | POA: Diagnosis not present

## 2018-06-09 DIAGNOSIS — Z79899 Other long term (current) drug therapy: Secondary | ICD-10-CM

## 2018-06-09 DIAGNOSIS — I5032 Chronic diastolic (congestive) heart failure: Secondary | ICD-10-CM | POA: Insufficient documentation

## 2018-06-09 DIAGNOSIS — I110 Hypertensive heart disease with heart failure: Secondary | ICD-10-CM | POA: Insufficient documentation

## 2018-06-09 DIAGNOSIS — R0689 Other abnormalities of breathing: Secondary | ICD-10-CM | POA: Diagnosis not present

## 2018-06-09 DIAGNOSIS — Z7982 Long term (current) use of aspirin: Secondary | ICD-10-CM | POA: Insufficient documentation

## 2018-06-09 DIAGNOSIS — I2510 Atherosclerotic heart disease of native coronary artery without angina pectoris: Secondary | ICD-10-CM | POA: Insufficient documentation

## 2018-06-09 LAB — CBC WITH DIFFERENTIAL/PLATELET
Basophils Absolute: 0 10*3/uL (ref 0.0–0.1)
Basophils Relative: 0 %
Eosinophils Absolute: 0 10*3/uL (ref 0.0–0.7)
Eosinophils Relative: 0 %
HCT: 40.6 % (ref 39.0–52.0)
Hemoglobin: 13.2 g/dL (ref 13.0–17.0)
Lymphocytes Relative: 8 %
Lymphs Abs: 0.8 10*3/uL (ref 0.7–4.0)
MCH: 28.9 pg (ref 26.0–34.0)
MCHC: 32.5 g/dL (ref 30.0–36.0)
MCV: 88.8 fL (ref 78.0–100.0)
Monocytes Absolute: 0.5 10*3/uL (ref 0.1–1.0)
Monocytes Relative: 5 %
Neutro Abs: 8.8 10*3/uL — ABNORMAL HIGH (ref 1.7–7.7)
Neutrophils Relative %: 87 %
Platelets: 162 10*3/uL (ref 150–400)
RBC: 4.57 MIL/uL (ref 4.22–5.81)
RDW: 13.1 % (ref 11.5–15.5)
WBC: 10.2 10*3/uL (ref 4.0–10.5)

## 2018-06-09 LAB — COMPREHENSIVE METABOLIC PANEL
ALT: 16 U/L (ref 0–44)
AST: 21 U/L (ref 15–41)
Albumin: 3.3 g/dL — ABNORMAL LOW (ref 3.5–5.0)
Alkaline Phosphatase: 55 U/L (ref 38–126)
Anion gap: 9 (ref 5–15)
BUN: 14 mg/dL (ref 8–23)
CO2: 24 mmol/L (ref 22–32)
Calcium: 8.3 mg/dL — ABNORMAL LOW (ref 8.9–10.3)
Chloride: 106 mmol/L (ref 98–111)
Creatinine, Ser: 1.2 mg/dL (ref 0.61–1.24)
GFR calc Af Amer: >60 mL/min (ref >60)
GFR calc non Af Amer: >60 mL/min (ref >60)
Glucose, Bld: 149 mg/dL — ABNORMAL HIGH (ref 70–99)
Potassium: 3.5 mmol/L (ref 3.5–5.1)
Sodium: 139 mmol/L (ref 135–145)
Total Bilirubin: 1.1 mg/dL (ref 0.3–1.2)
Total Protein: 6.4 g/dL — ABNORMAL LOW (ref 6.5–8.1)

## 2018-06-09 LAB — LIPASE, BLOOD: Lipase: 30 U/L (ref 11–51)

## 2018-06-09 LAB — I-STAT CG4 LACTIC ACID, ED: Lactic Acid, Venous: 1.42 mmol/L (ref 0.5–1.9)

## 2018-06-09 MED ORDER — SODIUM CHLORIDE 0.9 % IV BOLUS
1000.0000 mL | Freq: Once | INTRAVENOUS | Status: AC
  Administered 2018-06-09: 1000 mL via INTRAVENOUS

## 2018-06-09 MED ORDER — ONDANSETRON HCL 4 MG PO TABS: 4.0000 mg | ORAL_TABLET | Freq: Four times a day (QID) | ORAL | 0 refills | Status: DC

## 2018-06-09 MED ORDER — LEVOFLOXACIN 500 MG PO TABS
500.0000 mg | ORAL_TABLET | Freq: Once | ORAL | Status: AC
  Administered 2018-06-09: 500 mg via ORAL
  Filled 2018-06-09: qty 1

## 2018-06-09 MED ORDER — ACETAMINOPHEN 650 MG RE SUPP
650.0000 mg | Freq: Once | RECTAL | Status: AC
  Administered 2018-06-09: 650 mg via RECTAL
  Filled 2018-06-09: qty 1

## 2018-06-09 NOTE — ED Triage Notes (Addendum)
Pt arrives via EMS from home. Pt was here yesterday and dx with pneumonia. Pt reports that today he has had 3-4 episodes of vomiting and has had a fever at home. Use of video interpreter. Pt has not taken abx prescribed today. Pt states " I keep throwing up". Pt endorses continued cough.

## 2018-06-09 NOTE — ED Provider Notes (Signed)
[facility] DEPT Provider Note   CSN: [id number] Arrival date & time: 06/09/18  1812     History   Chief Complaint Chief Complaint  Patient presents with  . Fatigue    HPI Barry [name] is a 67 y.o. male with PMH/o CAD, DM BIB EMS for evaluation of fever, vomiting, cough presents for evaluation of persistent nausea/vomiting, fever and cough.  He was seen here yesterday at [facility], ED for evaluation of his malaise, nausea/vomiting.  His work-up was reassuring.  Chest x-ray was concerning for developing pneumonia.  He reported feeling better after IV fluids and was discharged home with Levaquin.  He received his first dose here in the ED.  Patient called EMS today because he states that he is continued to have vomiting and reports that he was not getting any better.  He states that he felt subjectively febrile.  He did not measure a temperature.  He tried to take Tylenol but states that when he took it, he had an episode of vomiting was unable to keep down.  He states he has not been able to tolerate any p.o. today.  He reports 4-5 episodes of nonbloody, nonbilious vomiting.  States that his stomach does not hurt.  He states that he has just felt lightheaded, generalized malaise.  He states that he did not take the antibiotics that he was prescribed.  Patient denies any blurry vision, chest pain, difficulty breathing, diarrhea, urinary complaints.  The history is provided by the patient.    Past Medical History:  Diagnosis Date  . CAD (coronary artery disease)    a. LHC (02/26/14):  mLAD 20 (faint L>R collats), mCFX 50, pRCA 95 (plaque rupture) >> PCI:  Xience DES to pRCA (severe tortuosity of R innominate artery >> needs L radial or FA in future);  b. LHC (03/10/14):  CFX 90, oOM 70, pRCA stent patent >> PCI:  Xience DES to mCFX   . Carotid stenosis    a. Carotid US (02/27/14):  Bilateral ICA 1-39%  . Depression   . Diabetes mellitus   . Dissection of mesenteric  artery ([place])    a. Mesenteric Artery Duplex (7/15):  pSMA chronic dissection with aneurysmal dilation of 1.29 cm (VVS);  b.  Chest CTA (02/26/14):  IMPRESSION:  1. No aortic dissection or other acute abnormality.  2. 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  3. Stable 17 mm ectasia of the left common iliac artery.  4. Atherosclerosis, including aortoiliac and coronary artery disease.   Marland Kitchen History of echocardiogram 09/2015   Echo 1/17: mod LVH, EF 55%, inf-lat HK, Gr 1 DD, mild LAE  . Hx of cardiovascular stress test    Lexiscan Myoview (2/16):  Inferior lateral defect c/w thinning vs small prior infarct, no ischemia, EF 49%, Low Risk  . Hx of echocardiogram    a.  Echocardiogram (02/27/14):  Mod focal basal and mild concentric LVH, EF 50-55%, no RWMA, Gr 1 DD, mild TR, normal RVF  . Hyperlipidemia   . Hypertension   . Iliac artery aneurysm, left ([place])   . Myocardial infarction ([place])   . Stroke [facility]) 09/26/2009    Patient Active Problem List   Diagnosis Date Noted  . Atherosclerotic peripheral vascular disease with ulceration ([place]) 04/03/2015  . Coronary artery disease involving native coronary artery of native heart without angina pectoris 03/28/2014  . Aneurysm of iliac artery ([place]) 03/18/2014  . Abdominal pain, unspecified site 03/18/2014  . Syncope in setting of  nausea and vomiting 03/07/14 03/08/2014  . Non compliance with medical treatment 02/28/2014  . Dyslipidemia, goal LDL below 70 02/28/2014  . Bradycardia- beta blocker decreased 02/28/2014  . Chronic diastolic CHF (congestive heart failure) ([place]) 02/28/2014  . Type 2 diabetes mellitus with manifestations ([place]) 02/27/2014  . TIA (transient ischemic attack) 02/26/2014  . History of non-ST elevation myocardial infarction (NSTEMI) 02/26/2014  . Weakness-Left arm / Lef 12/06/2013  . Mesenteric artery stenosis ([place]) 12/06/2013  . 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[place]) 12/03/2012  . Hypertension 11/10/2011  . History of CVA  in adulthood 11/10/2011  . Language barrier 11/10/2011    Past Surgical History:  Procedure Laterality Date  . CARDIAC CATHETERIZATION    . CARDIAC CATHETERIZATION N/A 06/25/2015   Procedure: Left Heart Cath and Coronary Angiography;  Surgeon: Burnell Blanks, MD;  Location: [facility] CV LAB;  Service: Cardiovascular;  Laterality: N/A;  . COLONOSCOPY WITH PROPOFOL N/A 07/23/2015   Procedure: COLONOSCOPY WITH PROPOFOL;  Surgeon: [name], MD;  Location: [facility] ENDOSCOPY;  Service: Endoscopy;  Laterality: N/A;  . CORONARY ANGIOPLASTY    . LEFT HEART CATHETERIZATION WITH CORONARY ANGIOGRAM N/A 02/26/2014   Procedure: LEFT HEART CATHETERIZATION WITH CORONARY ANGIOGRAM;  Surgeon: Wellington Hampshire, MD;  Location: [facility] CATH LAB;  Service: Cardiovascular;  Laterality: N/A;  . LEFT HEART CATHETERIZATION WITH CORONARY ANGIOGRAM N/A 03/10/2014   Procedure: LEFT HEART CATHETERIZATION WITH CORONARY ANGIOGRAM;  Surgeon: Jettie Booze, MD;  Location: [facility] CATH LAB;  Service: Cardiovascular;  Laterality: N/A;        Home Medications    Prior to Admission medications   Medication Sig Start Date End Date Taking? Authorizing Provider  Alirocumab (PRALUENT) 75 MG/ML SOPN Inject 1 pen into the skin every 14 (fourteen) days. 11/28/17   End, Harrell Gave, MD  amLODipine (NORVASC) 5 MG tablet Take 1 tablet (5 mg total) by mouth daily. 06/07/17   Copland, Gay Filler, MD  aspirin EC 81 MG EC tablet Take 1 tablet (81 mg total) by mouth daily. 05/05/17   Geradine Girt, DO  gabapentin (NEURONTIN) 300 MG capsule Take 1 capsule (300 mg total) by mouth at bedtime. Patient not taking: Reported on 06/08/2018 11/15/17   Copland, Gay Filler, MD  glipiZIDE (GLUCOTROL) 5 MG tablet Take 1 tablet (5 mg total) by mouth daily before breakfast. 04/16/18   Copland, Gay Filler, MD  glucose blood (ONE TOUCH ULTRA TEST) test strip Use to test blood sugar up to twice a day 03/19/18   Copland, Gay Filler, MD  glucose blood test strip  Use as instructed to test glucose up to twice times daily 03/05/18   Copland, Gay Filler, MD  Lancets 30G MISC Use to check glucose up to TID 03/05/18   Copland, Gay Filler, MD  levofloxacin (LEVAQUIN) 500 MG tablet Take 1 tablet (500 mg total) by mouth daily. 06/08/18   Jola Schmidt, MD  liraglutide (VICTOZA) 18 MG/3ML SOPN Take 0.6 mg [place] daily for one week, then increase to 1.2 mg Patient not taking: Reported on 06/08/2018 03/19/18   Copland, Gay Filler, MD  lisinopril (PRINIVIL,ZESTRIL) 10 MG tablet Take 1 tablet (10 mg total) by mouth daily. Patient not taking: Reported on 06/08/2018 10/19/16   Richardson Dopp T, PA-C  nitroGLYCERIN (NITROSTAT) 0.4 MG SL tablet Place 1 tablet (0.4 mg total) under the tongue every 5 (five) minutes as needed for chest pain. Patient not taking: Reported on 06/08/2018 10/18/16   Wendie Agreste, MD  ondansetron [facility]) 4 MG  tablet Take 1 tablet (4 mg total) by mouth every 6 (six) hours. 06/09/18   Volanda Napoleon, PA-C  ONETOUCH DELICA LANCETS FINE MISC Use to test blood sugar up to twice a day 03/19/18   Copland, Gay Filler, MD  rosuvastatin (CRESTOR) 40 MG tablet Take 1 tablet (40 mg total) by mouth daily. Patient not taking: Reported on 06/08/2018 06/28/17 06/23/18  Richardson Dopp T, PA-C  traZODone (DESYREL) 50 MG tablet Take 0.5-1 tablets (25-50 mg total) by mouth at bedtime as needed for sleep. Patient not taking: Reported on 06/08/2018 06/07/17   Copland, Gay Filler, MD    Family History Family History  Problem Relation Age of Onset  . Diabetes Father   . Hypertension Father   . Heart attack Father   . Stroke Neg Hx     Social History Social History   Tobacco Use  . Smoking status: Former Smoker    Packs/day: 0.25    Years: 38.00    Pack years: 9.50    Types: Cigarettes    Last attempt to quit: 2008    Years since quitting: 11.7  . Smokeless tobacco: Never Used  Substance Use Topics  . Alcohol use: No    Alcohol/week: 0.0 standard drinks  . Drug use: No      Allergies   Metformin and related   Review of Systems Review of Systems  Constitutional: Negative for fever.  Eyes: Negative for visual disturbance.  Respiratory: Positive for cough. Negative for shortness of breath.   Cardiovascular: Negative for chest pain.  Gastrointestinal: Positive for nausea and vomiting. Negative for abdominal pain.  Genitourinary: Negative for dysuria and hematuria.  Musculoskeletal: Positive for myalgias.  Neurological: Positive for light-headedness. Negative for headaches.  All other systems reviewed and are negative.    Physical Exam Updated Vital Signs BP 127/64   Pulse 75   Temp 98.9 F (37.2 C) (Oral)   Resp 16   Wt 74.8 kg   SpO2 96%   BMI 24.37 kg/m   Physical Exam  Constitutional: He is oriented to person, place, and time. He appears well-developed and well-nourished.  HENT:  Head: Normocephalic and atraumatic.  Mouth/Throat: Oropharynx is clear and moist and mucous membranes are normal.  Eyes: Pupils are equal, round, and reactive to light. Conjunctivae, EOM and lids are normal.  Neck: Full passive range of motion without pain.  Cardiovascular: Normal rate, regular rhythm, normal heart sounds and normal pulses. Exam reveals no gallop and no friction rub.  No murmur heard. Pulmonary/Chest: Effort normal. He has rales in the right lower field and the left lower field.  Rales noted to bilateral bases. No evidence of respiratory distress. Patient able to speak in full sentences without any difficulty.   Abdominal: Soft. Normal appearance. There is no tenderness. There is no rigidity and no guarding.  Abdomen is soft, non-distended, non-tender. No rigidity, No guarding. No peritoneal signs.  Musculoskeletal: Normal range of motion.  Neurological: He is alert and oriented to person, place, and time.  Skin: Skin is warm and dry. Capillary refill takes less than 2 seconds.  Psychiatric: He has a normal mood and affect. His speech is  normal.  Nursing note and vitals reviewed.    ED Treatments / Results  Labs (all labs ordered are listed, but only abnormal results are displayed) Labs Reviewed  COMPREHENSIVE METABOLIC PANEL - Abnormal; Notable for the following components:      Result Value   Glucose, Bld 149 (*)  Calcium 8.3 (*)    Total Protein 6.4 (*)    Albumin 3.3 (*)    All other components within normal limits  CBC WITH DIFFERENTIAL/PLATELET - Abnormal; Notable for the following components:   Neutro Abs 8.8 (*)    All other components within normal limits  LIPASE, BLOOD  URINALYSIS, ROUTINE W REFLEX MICROSCOPIC  I-STAT CG4 LACTIC ACID, ED  I-STAT CG4 LACTIC ACID, ED    EKG None  Radiology Dg Chest 2 View  Result Date: 06/09/2018 CLINICAL DATA:  Patient with history of pneumonia. EXAM: CHEST - 2 VIEW COMPARISON:  Chest radiograph 06/08/2018 FINDINGS: Monitoring leads overlie the patient. Stable cardiomegaly. Persistent heterogeneous opacities left mid and lower lung. No pleural effusion or pneumothorax. Thoracic spine degenerative changes. IMPRESSION: Persistent heterogeneous opacities left mid and lower lung which may represent pneumonia. Followup PA and lateral chest X-ray is recommended in 3-4 weeks following trial of antibiotic therapy to ensure resolution and exclude underlying malignancy. Electronically Signed   By: Lovey Newcomer M.D.   On: 06/09/2018 19:46   Dg Chest 2 View  Result Date: 06/08/2018 CLINICAL DATA:  Cough and fever EXAM: CHEST - 2 VIEW COMPARISON:  12/17/2017 FINDINGS: Mild cardiomegaly with prominence of the central vascularity. Streaky opacity in the left lung base. No pleural effusion. No pneumothorax. IMPRESSION: Low lung volumes. Streaky atelectasis or minimal infiltrate at the left base. Borderline to mild cardiomegaly. Electronically Signed   By: Donavan Foil M.D.   On: 06/08/2018 18:58    Procedures Procedures (including critical care time)  Medications Ordered in  ED Medications  sodium chloride 0.9 % bolus 1,000 mL (0 mLs Intravenous Stopped 06/09/18 1952)  acetaminophen (TYLENOL) suppository 650 mg (650 mg Rectal Given 06/09/18 1856)  levofloxacin (LEVAQUIN) tablet 500 mg (500 mg Oral Given 06/09/18 2134)     Initial Impression / Assessment and Plan / ED Course  I have reviewed the triage vital signs and the nursing notes.  Pertinent labs & imaging results that were available during my care of the patient were reviewed by me and considered in my medical decision making (see chart for details).     67 y.o. M with PMH/o CAD, DM resents for evaluation of generalized malaise, nausea/vomiting.  Was seen yesterday and was diagnosed with pneumonia.  Additionally, he had some nausea/vomiting yesterday.  Improved after fluids.  Was feeling better and vital signs were stable and he was discharged home with Levaquin.  He reports he has not taken any of the antibiotics today.  Called EMS because he is continued to have some vomiting today.  No abdominal pain.  No chest pain, difficulty breathing.  He reports fever at home but did not measure it.  Reports he took Tylenol but threw it up [place] he does not think it worked. Patient is febrile but is non-toxic appearing, sitting comfortably on examination table.  Vital signs reviewed and stable.  Patient with some rales noted to bilateral bases of the lungs.  Abdomen exam is benign.  Considered continue pneumonia.  Low suspicion for DKA but will evaluate.  We will plan for basic labs, repeat chest x-ray.  CBC with no significant leukocytosis or anemia.  CMP shows BUN and creatinine are within normal limits.  Bicarb is 24.  Anion gap is 9.  Glucose is 149.  Lipase is unremarkable.  I-STAT lactic acid is negative.  Chest x-ray shows persistent opacities in the left mid and lower lung representing pneumonia.  Reevaluation.  He has not had  any vomiting since being here in ED.  On my reevaluation, he is currently eating [employer]  fried chicken, mashed potatoes and a soda.  Patients with no abdominal pain.  Abdominal exam is benign.  Repeat vitals show improvement in fever.  Discussed patient with Dr. Lita Mains for evaluation.  Patient stable for discharge at this time.  Discussed with both patient and son who is in the room and able to offer translation.  He has a prescription for his Levaquin for.  Encouraged him to fill it and take it as directed.  We will give him a dose here in the ED.  Additionally, will send home with Zofran for vomiting. Patient had ample opportunity for questions and discussion. All patient's questions were answered with full understanding. Strict return precautions discussed. Patient expresses understanding and agreement to plan.   Final Clinical Impressions(s) / ED Diagnoses   Final diagnoses:  Community acquired pneumonia, unspecified laterality  Non-intractable vomiting with nausea, unspecified vomiting type    ED Discharge Orders         Ordered    ondansetron (ZOFRAN) 4 MG tablet  Every 6 hours     06/09/18 2227           Volanda Napoleon, PA-C 06/10/18 0016    Julianne Rice, MD 06/10/18 1630

## 2018-06-09 NOTE — Discharge Instructions (Signed)
It is very important that you take the antibiotic that your prescribed yesterday.  Take medication for nausea/vomiting as directed.  Make sure to drink plenty fluids and stay hydrated.  Take Tylenol or ibuprofen as needed for fever.  Return the emergency department for any worsening pain, persistent vomiting, chest pain, difficulty breathing or any other worsening or concerning symptoms.

## 2018-06-09 NOTE — ED Notes (Signed)
Bed: JL59 Expected date:  Expected time:  Means of arrival:  Comments: Code Sepsis

## 2018-06-09 NOTE — ED Notes (Signed)
Pt made aware urine needed. Pt provided with urinal 

## 2018-06-09 NOTE — ED Provider Notes (Signed)
[facility] DEPT Provider Note   CSN: [id number] Arrival date & time: 06/08/18  1619     History   Chief Complaint Chief Complaint  Patient presents with  . Abdominal Pain  . Nausea  . Emesis    HPI Barry [name] is a 67 y.o. male.  HPI Patient is a 67 year old male from [place] who presents to the emergency department with complaints of nausea vomiting and generalized malaise today.  He began feeling worse yesterday.  He has been lying in the bed.  He feels generally weak.  He has mild headache.  Reports fever without documentation at home.  Chills.  Sleepy under blankets.  Also reports some left shoulder and left knee pain.  He reports generalized myalgias.  He was given 4050 cc of IV fluid by EMS and 4 mg of Zofran reports that his nausea is somewhat improved at this time.   Past Medical History:  Diagnosis Date  . CAD (coronary artery disease)    a. LHC (02/26/14):  mLAD 20 (faint L>R collats), mCFX 50, pRCA 95 (plaque rupture) >> PCI:  Xience DES to pRCA (severe tortuosity of R innominate artery >> needs L radial or FA in future);  b. LHC (03/10/14):  CFX 90, oOM 70, pRCA stent patent >> PCI:  Xience DES to mCFX   . Carotid stenosis    a. Carotid US (02/27/14):  Bilateral ICA 1-39%  . Depression   . Diabetes mellitus   . 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[place])    a. Mesenteric Artery Duplex (7/15):  pSMA chronic dissection with aneurysmal dilation of 1.29 cm (VVS);  b.  Chest CTA (02/26/14):  IMPRESSION:  1. No aortic dissection or other acute abnormality.  2. 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  3. Stable 17 mm ectasia of the left common iliac artery.  4. Atherosclerosis, including aortoiliac and coronary artery disease.   Marland Kitchen History of echocardiogram 09/2015   Echo 1/17: mod LVH, EF 55%, inf-lat HK, Gr 1 DD, mild LAE  . Hx of cardiovascular stress test    Lexiscan Myoview (2/16):  Inferior lateral defect c/w thinning vs small prior  infarct, no ischemia, EF 49%, Low Risk  . Hx of echocardiogram    a.  Echocardiogram (02/27/14):  Mod focal basal and mild concentric LVH, EF 50-55%, no RWMA, Gr 1 DD, mild TR, normal RVF  . Hyperlipidemia   . Hypertension   . Iliac artery aneurysm, left ([place])   . Myocardial infarction ([place])   . Stroke [facility]) 09/26/2009    Patient Active Problem List   Diagnosis Date Noted  . Atherosclerotic peripheral vascular disease with ulceration ([place]) 04/03/2015  . Coronary artery disease involving native coronary artery of native heart without angina pectoris 03/28/2014  . Aneurysm of iliac artery ([place]) 03/18/2014  . Abdominal pain, unspecified site 03/18/2014  . Syncope in setting of nausea and vomiting 03/07/14 03/08/2014  . Non compliance with medical treatment 02/28/2014  . Dyslipidemia, goal LDL below 70 02/28/2014  . Bradycardia- beta blocker decreased 02/28/2014  . Chronic diastolic CHF (congestive heart failure) ([place]) 02/28/2014  . Type 2 diabetes mellitus with manifestations ([place]) 02/27/2014  . TIA (transient ischemic attack) 02/26/2014  . History of non-ST elevation myocardial infarction (NSTEMI) 02/26/2014  . Weakness-Left arm / Lef 12/06/2013  . Mesenteric artery stenosis ([place]) 12/06/2013  . 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[place]) 12/03/2012  . Hypertension 11/10/2011  . History of CVA in adulthood 11/10/2011  . Language barrier 11/10/2011  Past Surgical History:  Procedure Laterality Date  . CARDIAC CATHETERIZATION    . CARDIAC CATHETERIZATION N/A 06/25/2015   Procedure: Left Heart Cath and Coronary Angiography;  Surgeon: Burnell Blanks, MD;  Location: [facility] CV LAB;  Service: Cardiovascular;  Laterality: N/A;  . COLONOSCOPY WITH PROPOFOL N/A 07/23/2015   Procedure: COLONOSCOPY WITH PROPOFOL;  Surgeon: [name], MD;  Location: [facility] ENDOSCOPY;  Service: Endoscopy;  Laterality: N/A;  . CORONARY ANGIOPLASTY    . LEFT HEART CATHETERIZATION WITH CORONARY ANGIOGRAM  N/A 02/26/2014   Procedure: LEFT HEART CATHETERIZATION WITH CORONARY ANGIOGRAM;  Surgeon: Wellington Hampshire, MD;  Location: [facility] CATH LAB;  Service: Cardiovascular;  Laterality: N/A;  . LEFT HEART CATHETERIZATION WITH CORONARY ANGIOGRAM N/A 03/10/2014   Procedure: LEFT HEART CATHETERIZATION WITH CORONARY ANGIOGRAM;  Surgeon: Jettie Booze, MD;  Location: [facility] CATH LAB;  Service: Cardiovascular;  Laterality: N/A;        Home Medications    Prior to Admission medications   Medication Sig Start Date End Date Taking? Authorizing Provider  Alirocumab (PRALUENT) 75 MG/ML SOPN Inject 1 pen into the skin every 14 (fourteen) days. 11/28/17  Yes End, Harrell Gave, MD  amLODipine (NORVASC) 5 MG tablet Take 1 tablet (5 mg total) by mouth daily. 06/07/17  Yes Copland, Gay Filler, MD  aspirin EC 81 MG EC tablet Take 1 tablet (81 mg total) by mouth daily. 05/05/17  Yes Vann, Jessica U, DO  glipiZIDE (GLUCOTROL) 5 MG tablet Take 1 tablet (5 mg total) by mouth daily before breakfast. 04/16/18  Yes Copland, Gay Filler, MD  gabapentin (NEURONTIN) 300 MG capsule Take 1 capsule (300 mg total) by mouth at bedtime. Patient not taking: Reported on 06/08/2018 11/15/17   Copland, Gay Filler, MD  glucose blood (ONE TOUCH ULTRA TEST) test strip Use to test blood sugar up to twice a day 03/19/18   Copland, Gay Filler, MD  glucose blood test strip Use as instructed to test glucose up to twice times daily 03/05/18   Copland, Gay Filler, MD  Lancets 30G MISC Use to check glucose up to TID 03/05/18   Copland, Gay Filler, MD  levofloxacin (LEVAQUIN) 500 MG tablet Take 1 tablet (500 mg total) by mouth daily. 06/08/18   Jola Schmidt, MD  liraglutide (VICTOZA) 18 MG/3ML SOPN Take 0.6 mg [place] daily for one week, then increase to 1.2 mg Patient not taking: Reported on 06/08/2018 03/19/18   Copland, Gay Filler, MD  lisinopril (PRINIVIL,ZESTRIL) 10 MG tablet Take 1 tablet (10 mg total) by mouth daily. Patient not taking: Reported on 06/08/2018 10/19/16    Richardson Dopp T, PA-C  nitroGLYCERIN (NITROSTAT) 0.4 MG SL tablet Place 1 tablet (0.4 mg total) under the tongue every 5 (five) minutes as needed for chest pain. Patient not taking: Reported on 06/08/2018 10/18/16   Wendie Agreste, MD  [facility] DELICA LANCETS FINE MISC Use to test blood sugar up to twice a day 03/19/18   Copland, Gay Filler, MD  rosuvastatin (CRESTOR) 40 MG tablet Take 1 tablet (40 mg total) by mouth daily. Patient not taking: Reported on 06/08/2018 06/28/17 06/23/18  Richardson Dopp T, PA-C  traZODone (DESYREL) 50 MG tablet Take 0.5-1 tablets (25-50 mg total) by mouth at bedtime as needed for sleep. Patient not taking: Reported on 06/08/2018 06/07/17   Copland, Gay Filler, MD    Family History Family History  Problem Relation Age of Onset  . Diabetes Father   . Hypertension Father   . Heart attack Father   .  Stroke Neg Hx     Social History Social History   Tobacco Use  . Smoking status: Former Smoker    Packs/day: 0.25    Years: 38.00    Pack years: 9.50    Types: Cigarettes    Last attempt to quit: 2008    Years since quitting: 11.7  . Smokeless tobacco: Never Used  Substance Use Topics  . Alcohol use: No    Alcohol/week: 0.0 standard drinks  . Drug use: No     Allergies   Metformin and related   Review of Systems Review of Systems  All other systems reviewed and are negative.    Physical Exam Updated Vital Signs BP 122/63 (BP Location: Right Arm)   Pulse 81   Temp 98.8 F (37.1 C) (Oral)   Resp 17   SpO2 99%   Physical Exam  Constitutional: He is oriented to person, place, and time. He appears well-developed and well-nourished.  HENT:  Head: Normocephalic and atraumatic.  Eyes: EOM are normal.  Neck: Normal range of motion.  Cardiovascular: Normal rate, regular rhythm, normal heart sounds and intact distal pulses.  Pulmonary/Chest: Effort normal and breath sounds normal. No respiratory distress.  Abdominal: Soft. He exhibits no distension.  There is no tenderness.  Musculoskeletal: Normal range of motion.  Neurological: He is alert and oriented to person, place, and time.  Skin: Skin is warm and dry.  Psychiatric: He has a normal mood and affect. Judgment normal.  Nursing note and vitals reviewed.    ED Treatments / Results  Labs (all labs ordered are listed, but only abnormal results are displayed) Labs Reviewed  CBC - Abnormal; Notable for the following components:      Result Value   WBC 11.5 (*)    All other components within normal limits  COMPREHENSIVE METABOLIC PANEL - Abnormal; Notable for the following components:   Glucose, Bld 125 (*)    All other components within normal limits  URINALYSIS, ROUTINE W REFLEX MICROSCOPIC - Abnormal; Notable for the following components:   Glucose, UA >=500 (*)    Hgb urine dipstick SMALL (*)    All other components within normal limits  CULTURE, BLOOD (ROUTINE X 2)  CULTURE, BLOOD (ROUTINE X 2)  URINE CULTURE  INFLUENZA PANEL BY PCR (TYPE A & B)    EKG None  Radiology Dg Chest 2 View  Result Date: 06/08/2018 CLINICAL DATA:  Cough and fever EXAM: CHEST - 2 VIEW COMPARISON:  12/17/2017 FINDINGS: Mild cardiomegaly with prominence of the central vascularity. Streaky opacity in the left lung base. No pleural effusion. No pneumothorax. IMPRESSION: Low lung volumes. Streaky atelectasis or minimal infiltrate at the left base. Borderline to mild cardiomegaly. Electronically Signed   By: Donavan Foil M.D.   On: 06/08/2018 18:58    Procedures Procedures (including critical care time)  Medications Ordered in ED Medications  sodium chloride 0.9 % bolus 1,000 mL (0 mLs Intravenous Stopped 06/08/18 1932)  ondansetron (ZOFRAN) injection 4 mg (4 mg Intravenous Given 06/08/18 1754)  morphine 4 MG/ML injection 4 mg (4 mg Intravenous Given 06/08/18 1754)  sodium chloride 0.9 % bolus 1,000 mL (0 mLs Intravenous Stopped 06/08/18 2037)  levofloxacin (LEVAQUIN) tablet 500 mg (500 mg Oral  Given 06/08/18 1935)     Initial Impression / Assessment and Plan / ED Course  I have reviewed the triage vital signs and the nursing notes.  Pertinent labs & imaging results that were available during my care of the patient were  reviewed by me and considered in my medical decision making (see chart for details).     May represent developing pneumonia.  Feels much better after IV fluids.  Discharged home in good condition.  Home with Levaquin.  Patient encouraged to return the ER for new or worsening symptoms.  Repeat abdominal exam without focal tenderness.  Final Clinical Impressions(s) / ED Diagnoses   Final diagnoses:  Community acquired pneumonia, unspecified laterality    ED Discharge Orders         Ordered    levofloxacin (LEVAQUIN) 500 MG tablet  Daily     06/08/18 2350           Jola Schmidt, MD 06/09/18 [id number]

## 2018-06-10 LAB — URINE CULTURE: Culture: NO GROWTH

## 2018-06-13 LAB — CULTURE, BLOOD (ROUTINE X 2)
Culture: NO GROWTH
Culture: NO GROWTH
Special Requests: ADEQUATE
Special Requests: ADEQUATE

## 2018-06-15 ENCOUNTER — Ambulatory Visit: Payer: Medicare Other | Admitting: Endocrinology

## 2018-06-17 NOTE — Progress Notes (Signed)
[place] [facility] [address], [place], [place] [address] [phone]  Date:  06/20/2018   Name:  Barry [name]   DOB:  1951/01/08   MRN:  [id number]  PCP:  Darreld Mclean, MD    Chief Complaint: Hospitalization Follow-up (pneumonia, still night sweats/chills)   History of Present Illness:  Mika Fukuda is a 67 y.o. very pleasant male patient who presents with the following:  Coming in for ER follow-up today He was in the ER on 9/13 and 9/14 with CAP.  Per most recent ER note: 67 y.o. M with PMH/o CAD, DM resents for evaluation of generalized malaise, nausea/vomiting.  Was seen yesterday and was diagnosed with pneumonia.  Additionally, he had some nausea/vomiting yesterday.  Improved after fluids.  Was feeling better and vital signs were stable and he was discharged home with Levaquin.  He reports he has not taken any of the antibiotics today.  Called EMS because he is continued to have some vomiting today.  No abdominal pain.  No chest pain, difficulty breathing.  He reports fever at home but did not measure it.  Reports he took Tylenol but threw it up [place] he does not think it worked. Patient is febrile but is non-toxic appearing, sitting comfortably on examination table.  Vital signs reviewed and stable.  Patient with some rales noted to bilateral bases of the lungs.  Abdomen exam is benign.  Considered continue pneumonia.  Low suspicion for DKA but will evaluate.  We will plan for basic labs, repeat chest x-ray. CBC with no significant leukocytosis or anemia.  CMP shows BUN and creatinine are within normal limits.  Bicarb is 24.  Anion gap is 9.  Glucose is 149.  Lipase is unremarkable.  I-STAT lactic acid is negative.  Chest x-ray shows persistent opacities in the left mid and lower lung representing pneumonia. Reevaluation.  He has not had any vomiting since being here in ED.  On my reevaluation, he is currently eating [employer] fried  chicken, mashed potatoes and a soda.  Patients with no abdominal pain.  Abdominal exam is benign.  Repeat vitals show improvement in fever. Discussed patient with Dr. Lita Mains for evaluation.  Patient stable for discharge at this time.  Discussed with both patient and son who is in the room and able to offer translation.  He has a prescription for his Levaquin for.  Encouraged him to fill it and take it as directed.  We will give him a dose here in the ED.  Additionally, will send home with Zofran for vomiting. Patient had ample opportunity for questions and discussion. All patient's questions were answered with full understanding. Strict return precautions discussed. Patient expresses understanding and agreement to plan.   I last saw him otherwise in July-  He takes both insulin and victoza every day- however later in the visit he stated that he stopped taking both about 2 days ago for reasons that are not entirely clear to me.   He also stopped metformin a few months ago as it seemed to cause "constipaton" He feels that victoza makes him feel tired.   He feels like his medications don't work anyway and that his glucose is still high  At this time I explained that I don't know how to help him with his diabetes as he will not take any medication that I rx for him. He would like to treat his diabetes. I will refer to  endocrinology in case they can be more helpful.  Will have him start on glipizide 5 mg for now He would like to see endocrinology in [place] as this is most convenient for him  BP is up today- he did not take his BP meds for a couple of days due to feeling bloated.   Asked him to go back on his BP medications He is using both crestor and Praluent we think- I will check with PharmD at cardiology to see if he needs both.  I think he can stop the crestor - he will stop for now while I query his pharmacist Trevious continues to be very difficult to care for as he stops and starts his meds as he sees  fit and attributes side effects to different medications at different times.   He did see Dr. Loanne Drilling on Monday of this week to start management of his DM; I hope that his A1c will soon be improved  He is to take both Crestor and Praluent for his hyperlipidemia with history of CAD  His wife notes that he is still sweating some at night but he is not running a fever They finished out his antibiotic for pneumonia  Overall he is feeling better.  Cough is less Explained that a repeat CXR will be needed in about 3 weeks and where to have this done  Wife is present today and helps with communication as pt does not have great English  Flu shot today   BP Readings from Last 3 Encounters:  06/20/18 (!) 110/50  06/18/18 132/72  06/09/18 127/64     Dg Chest 2 View  Result Date: 06/09/2018 CLINICAL DATA:  Patient with history of pneumonia. EXAM: CHEST - 2 VIEW COMPARISON:  Chest radiograph 06/08/2018 FINDINGS: Monitoring leads overlie the patient. Stable cardiomegaly. Persistent heterogeneous opacities left mid and lower lung. No pleural effusion or pneumothorax. Thoracic spine degenerative changes. IMPRESSION: Persistent heterogeneous opacities left mid and lower lung which may represent pneumonia. Followup PA and lateral chest X-ray is recommended in 3-4 weeks following trial of antibiotic therapy to ensure resolution and exclude underlying malignancy. Electronically Signed   By: Lovey Newcomer M.D.   On: 06/09/2018 19:46   Dg Chest 2 View  Result Date: 06/08/2018 CLINICAL DATA:  Cough and fever EXAM: CHEST - 2 VIEW COMPARISON:  12/17/2017 FINDINGS: Mild cardiomegaly with prominence of the central vascularity. Streaky opacity in the left lung base. No pleural effusion. No pneumothorax. IMPRESSION: Low lung volumes. Streaky atelectasis or minimal infiltrate at the left base. Borderline to mild cardiomegaly. Electronically Signed   By: Donavan Foil M.D.   On: 06/08/2018 18:58    Lab Results   Component Value Date   HGBA1C 8.5 (A) 06/18/2018    Patient Active Problem List   Diagnosis Date Noted  . Atherosclerotic peripheral vascular disease with ulceration ([place]) 04/03/2015  . Coronary artery disease involving native coronary artery of native heart without angina pectoris 03/28/2014  . Aneurysm of iliac artery ([place]) 03/18/2014  . Abdominal pain, unspecified site 03/18/2014  . Syncope in setting of nausea and vomiting 03/07/14 03/08/2014  . Non compliance with medical treatment 02/28/2014  . Dyslipidemia, goal LDL below 70 02/28/2014  . Bradycardia- beta blocker decreased 02/28/2014  . Chronic diastolic CHF (congestive heart failure) ([place]) 02/28/2014  . Type 2 diabetes mellitus with manifestations ([place]) 02/27/2014  . TIA (transient ischemic attack) 02/26/2014  . History of non-ST elevation myocardial infarction (NSTEMI) 02/26/2014  . Weakness-Left arm / Lef  12/06/2013  . Mesenteric artery stenosis ([place]) 12/06/2013  . 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[place]) 12/03/2012  . Hypertension 11/10/2011  . History of CVA in adulthood 11/10/2011  . Language barrier 11/10/2011    Past Medical History:  Diagnosis Date  . CAD (coronary artery disease)    a. LHC (02/26/14):  mLAD 20 (faint L>R collats), mCFX 50, pRCA 95 (plaque rupture) >> PCI:  Xience DES to pRCA (severe tortuosity of R innominate artery >> needs L radial or FA in future);  b. LHC (03/10/14):  CFX 90, oOM 70, pRCA stent patent >> PCI:  Xience DES to mCFX   . Carotid stenosis    a. Carotid US (02/27/14):  Bilateral ICA 1-39%  . Depression   . Diabetes mellitus   . 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[place])    a. Mesenteric Artery Duplex (7/15):  pSMA chronic dissection with aneurysmal dilation of 1.29 cm (VVS);  b.  Chest CTA (02/26/14):  IMPRESSION:  1. No aortic dissection or other acute abnormality.  2. 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  3. Stable 17 mm ectasia of the left common iliac artery.  4.  Atherosclerosis, including aortoiliac and coronary artery disease.   Marland Kitchen History of echocardiogram 09/2015   Echo 1/17: mod LVH, EF 55%, inf-lat HK, Gr 1 DD, mild LAE  . Hx of cardiovascular stress test    Lexiscan Myoview (2/16):  Inferior lateral defect c/w thinning vs small prior infarct, no ischemia, EF 49%, Low Risk  . Hx of echocardiogram    a.  Echocardiogram (02/27/14):  Mod focal basal and mild concentric LVH, EF 50-55%, no RWMA, Gr 1 DD, mild TR, normal RVF  . Hyperlipidemia   . Hypertension   . Iliac artery aneurysm, left ([place])   . Myocardial infarction ([place])   . Stroke [facility]) 09/26/2009    Past Surgical History:  Procedure Laterality Date  . CARDIAC CATHETERIZATION    . CARDIAC CATHETERIZATION N/A 06/25/2015   Procedure: Left Heart Cath and Coronary Angiography;  Surgeon: Burnell Blanks, MD;  Location: [place] CV LAB;  Service: Cardiovascular;  Laterality: N/A;  . COLONOSCOPY WITH PROPOFOL N/A 07/23/2015   Procedure: COLONOSCOPY WITH PROPOFOL;  Surgeon: [name], MD;  Location: [facility] ENDOSCOPY;  Service: Endoscopy;  Laterality: N/A;  . CORONARY ANGIOPLASTY    . LEFT HEART CATHETERIZATION WITH CORONARY ANGIOGRAM N/A 02/26/2014   Procedure: LEFT HEART CATHETERIZATION WITH CORONARY ANGIOGRAM;  Surgeon: Wellington Hampshire, MD;  Location: [place] CATH LAB;  Service: Cardiovascular;  Laterality: N/A;  . LEFT HEART CATHETERIZATION WITH CORONARY ANGIOGRAM N/A 03/10/2014   Procedure: LEFT HEART CATHETERIZATION WITH CORONARY ANGIOGRAM;  Surgeon: Jettie Booze, MD;  Location: [facility] CATH LAB;  Service: Cardiovascular;  Laterality: N/A;    Social History   Tobacco Use  . Smoking status: Former Smoker    Packs/day: 0.25    Years: 38.00    Pack years: 9.50    Types: Cigarettes    Last attempt to quit: 2008    Years since quitting: 11.7  . Smokeless tobacco: Never Used  Substance Use Topics  . Alcohol use: No    Alcohol/week: 0.0 standard drinks  . Drug use: No    Family  History  Problem Relation Age of Onset  . Diabetes Father   . Hypertension Father   . Heart attack Father   . Stroke Neg Hx     Allergies  Allergen Reactions  . Metformin And Related     Pt feels  that this causes constipation and will not take     Medication list has been reviewed and updated.  Current Outpatient Medications on File Prior to Visit  Medication Sig Dispense Refill  . Alirocumab (PRALUENT) 75 MG/ML SOPN Inject 1 pen into the skin every 14 (fourteen) days. 2 pen 11  . amLODipine (NORVASC) 5 MG tablet Take 1 tablet (5 mg total) by mouth daily. 90 tablet 3  . aspirin EC 81 MG EC tablet Take 1 tablet (81 mg total) by mouth daily.    Marland Kitchen gabapentin (NEURONTIN) 300 MG capsule Take 1 capsule (300 mg total) by mouth at bedtime. 90 capsule 3  . glipiZIDE (GLUCOTROL) 5 MG tablet Take 1 tablet (5 mg total) by mouth daily before breakfast. 30 tablet 6  . lisinopril (PRINIVIL,ZESTRIL) 10 MG tablet Take 1 tablet (10 mg total) by mouth daily. 30 tablet 11  . glucose blood (ONE TOUCH ULTRA TEST) test strip Use to test blood sugar up to twice a day (Patient not taking: Reported on 06/20/2018) 100 each 12  . glucose blood test strip Use as instructed to test glucose up to twice times daily (Patient not taking: Reported on 06/20/2018) 100 each 12  . Lancets 30G MISC Use to check glucose up to TID (Patient not taking: Reported on 06/20/2018) 100 each 6  . nitroGLYCERIN (NITROSTAT) 0.4 MG SL tablet Place 1 tablet (0.4 mg total) under the tongue every 5 (five) minutes as needed for chest pain. (Patient not taking: Reported on 06/08/2018) 25 tablet 1  . ondansetron (ZOFRAN) 4 MG tablet Take 1 tablet (4 mg total) by mouth every 6 (six) hours. (Patient not taking: Reported on 06/20/2018) 6 tablet 0  . ONETOUCH DELICA LANCETS FINE MISC Use to test blood sugar up to twice a day (Patient not taking: Reported on 06/20/2018) 100 each prn  . rosuvastatin (CRESTOR) 40 MG tablet Take 1 tablet (40 mg total) by  mouth daily. (Patient not taking: Reported on 06/08/2018) 90 tablet 3  . traZODone (DESYREL) 50 MG tablet Take 0.5-1 tablets (25-50 mg total) by mouth at bedtime as needed for sleep. (Patient not taking: Reported on 06/08/2018) 30 tablet 3   No current facility-administered medications on file prior to visit.     Review of Systems:  As per HPI- otherwise negative. No fever or chills Overall feeling better Still sweating at night   Physical Examination: Vitals:   06/20/18 1229  BP: (!) 110/50  Pulse: 73  Resp: 16  Temp: 97.9 F (36.6 C)  SpO2: 98%   Vitals:   06/20/18 1229  Weight: 155 lb (70.3 kg)  Height: 5\' 5"  (1.651 m)   Body mass index is 25.79 kg/m. Ideal Body Weight: Weight in (lb) to have BMI = 25: 149.9  GEN: WDWN, NAD, Non-toxic, A & O x 3, looks well, his normal self.  Multiple home-made tattoos  HEENT: Atraumatic, Normocephalic. Neck supple. No masses, No LAD. Ears and Nose: No external deformity. CV: RRR, No M/G/R. No JVD. No thrill. No extra heart sounds. PULM: CTA B, no wheezes, crackles, rhonchi. No retractions. No resp. distress. No accessory muscle use. EXTR: No c/c/e NEURO Normal gait.  PSYCH: Normally interactive. Conversant. Not depressed or anxious appearing.  Calm demeanor.    Assessment and Plan: Community acquired pneumonia, unspecified laterality - Plan: DG Chest 2 View  Type 2 diabetes mellitus with complication, without long-term current use of insulin ([place])  Essential hypertension  Non compliance with medical treatment  Hospital discharge follow-up  Need for influenza vaccination - Plan: Flu vaccine HIGH DOSE PF (Fluzone High dose)  Following up from recent CAP Took all of abx Doing better Repeat CXR in about 3 weeks BP is lower today than normal- may be due to better compliance with BP meds.  He reports that he is taking both amlodipine and lisinopril.  If this persists we may need to decrease his BP meds. Will leave on current  regimen for now as there has been a history of confusion and medication non- compliance   See patient instructions for more details.     Signed Lamar Blinks, MD

## 2018-06-18 ENCOUNTER — Ambulatory Visit (INDEPENDENT_AMBULATORY_CARE_PROVIDER_SITE_OTHER): Payer: Medicare Other | Admitting: Endocrinology

## 2018-06-18 ENCOUNTER — Encounter: Payer: Self-pay | Admitting: Endocrinology

## 2018-06-18 VITALS — BP 132/72 | HR 71 | Ht 69.0 in | Wt 157.8 lb

## 2018-06-18 DIAGNOSIS — E118 Type 2 diabetes mellitus with unspecified complications: Secondary | ICD-10-CM | POA: Diagnosis not present

## 2018-06-18 DIAGNOSIS — I2510 Atherosclerotic heart disease of native coronary artery without angina pectoris: Secondary | ICD-10-CM

## 2018-06-18 LAB — POCT GLYCOSYLATED HEMOGLOBIN (HGB A1C): Hemoglobin A1C: 8.5 % — AB (ref 4.0–5.6)

## 2018-06-18 MED ORDER — GLIPIZIDE 5 MG PO TABS: 5.0000 mg | ORAL_TABLET | Freq: Every day | ORAL | 6 refills | Status: DC

## 2018-06-18 NOTE — Progress Notes (Signed)
Subjective:    Patient ID: Barry [name], male    DOB: May 04, 1951, 67 y.o.   MRN: [id number]  HPI pt is referred by Dr Lorelei Pont, for diabetes.  Pt states DM was dx'ed in 2008; he has moderate neuropathy of the lower extremities, and associated CAD; he took insulin 2018-2019; pt says his diet and exercise are good; he has never had pancreatitis, pancreatic surgery, severe hypoglycemia or DKA.  He takes glipizide.  He has been off glipizide x 3 mos.  He says he liked this, and wants to resume.  He does not know why he stopped metformin.  Pt says he stopped victoza, due to abd bloating Past Medical History:  Diagnosis Date  . CAD (coronary artery disease)    a. LHC (02/26/14):  mLAD 20 (faint L>R collats), mCFX 50, pRCA 95 (plaque rupture) >> PCI:  Xience DES to pRCA (severe tortuosity of R innominate artery >> needs L radial or FA in future);  b. LHC (03/10/14):  CFX 90, oOM 70, pRCA stent patent >> PCI:  Xience DES to mCFX   . Carotid stenosis    a. Carotid US (02/27/14):  Bilateral ICA 1-39%  . Depression   . Diabetes mellitus   . 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[place])    a. Mesenteric Artery Duplex (7/15):  pSMA chronic dissection with aneurysmal dilation of 1.29 cm (VVS);  b.  Chest CTA (02/26/14):  IMPRESSION:  1. No aortic dissection or other acute abnormality.  2. 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  3. Stable 17 mm ectasia of the left common iliac artery.  4. Atherosclerosis, including aortoiliac and coronary artery disease.   Marland Kitchen History of echocardiogram 09/2015   Echo 1/17: mod LVH, EF 55%, inf-lat HK, Gr 1 DD, mild LAE  . Hx of cardiovascular stress test    Lexiscan Myoview (2/16):  Inferior lateral defect c/w thinning vs small prior infarct, no ischemia, EF 49%, Low Risk  . Hx of echocardiogram    a.  Echocardiogram (02/27/14):  Mod focal basal and mild concentric LVH, EF 50-55%, no RWMA, Gr 1 DD, mild TR, normal RVF  . Hyperlipidemia   . Hypertension   . Iliac artery aneurysm,  left ([place])   . Myocardial infarction ([place])   . Stroke [facility]) 09/26/2009    Past Surgical History:  Procedure Laterality Date  . CARDIAC CATHETERIZATION    . CARDIAC CATHETERIZATION N/A 06/25/2015   Procedure: Left Heart Cath and Coronary Angiography;  Surgeon: Burnell Blanks, MD;  Location: [place] CV LAB;  Service: Cardiovascular;  Laterality: N/A;  . COLONOSCOPY WITH PROPOFOL N/A 07/23/2015   Procedure: COLONOSCOPY WITH PROPOFOL;  Surgeon: [name], MD;  Location: [facility] ENDOS[facility];  Service: Endoscopy;  Laterality: N/A;  . CORONARY ANGIOPLASTY    . LEFT HEART CATHETERIZATION WITH CORONARY ANGIOGRAM N/A 02/26/2014   Procedure: LEFT HEART CATHETERIZATION WITH CORONARY ANGIOGRAM;  Surgeon: Wellington Hampshire, MD;  Location: [place] CATH LAB;  Service: Cardiovascular;  Laterality: N/A;  . LEFT HEART CATHETERIZATION WITH CORONARY ANGIOGRAM N/A 03/10/2014   Procedure: LEFT HEART CATHETERIZATION WITH CORONARY ANGIOGRAM;  Surgeon: Jettie Booze, MD;  Location: [facility] CATH LAB;  Service: Cardiovascular;  Laterality: N/A;    Social History   Socioeconomic History  . Marital status: Married    Spouse name: Not on file  . Number of children: 3  . Years of education: Not on file  . Highest education level: Not on file  Occupational History  . Occupation: Retired  Social Needs  . Financial resource strain: Not on file  . Food insecurity:    Worry: Not on file    Inability: Not on file  . Transportation needs:    Medical: Not on file    Non-medical: Not on file  Tobacco Use  . Smoking status: Former Smoker    Packs/day: 0.25    Years: 38.00    Pack years: 9.50    Types: Cigarettes    Last attempt to quit: 2008    Years since quitting: 11.7  . Smokeless tobacco: Never Used  Substance and Sexual Activity  . Alcohol use: No    Alcohol/week: 0.0 standard drinks  . Drug use: No  . Sexual activity: Yes  Lifestyle  . Physical activity:    Days per week: Not on file    Minutes  per session: Not on file  . Stress: Not on file  Relationships  . Social connections:    Talks on phone: Not on file    Gets together: Not on file    Attends religious service: Not on file    Active member of club or organization: Not on file    Attends meetings of clubs or organizations: Not on file    Relationship status: Not on file  . Intimate partner violence:    Fear of current or ex partner: Not on file    Emotionally abused: Not on file    Physically abused: Not on file    Forced sexual activity: Not on file  Other Topics Concern  . Not on file  Social History Narrative   Marital: married.      Lives: with son,wife.       Children: 3 children; 6 grandchildren      Employed: unemployed; disability unknown reason/CVA L sided weakness      Tobacco:  Quit 2012; smoked 40 years.       Alcohol: no drinking now; social in past.       Drugs: none       Exercise: sporadic.       ADLs:  No driving since CVA.    Current Outpatient Medications on File Prior to Visit  Medication Sig Dispense Refill  . Alirocumab (PRALUENT) 75 MG/ML SOPN Inject 1 pen into the skin every 14 (fourteen) days. 2 pen 11  . amLODipine (NORVASC) 5 MG tablet Take 1 tablet (5 mg total) by mouth daily. 90 tablet 3  . aspirin EC 81 MG EC tablet Take 1 tablet (81 mg total) by mouth daily.    Marland Kitchen gabapentin (NEURONTIN) 300 MG capsule Take 1 capsule (300 mg total) by mouth at bedtime. 90 capsule 3  . glucose blood (ONE TOUCH ULTRA TEST) test strip Use to test blood sugar up to twice a day (Patient not taking: Reported on 06/20/2018) 100 each 12  . glucose blood test strip Use as instructed to test glucose up to twice times daily (Patient not taking: Reported on 06/20/2018) 100 each 12  . Lancets 30G MISC Use to check glucose up to TID (Patient not taking: Reported on 06/20/2018) 100 each 6  . lisinopril (PRINIVIL,ZESTRIL) 10 MG tablet Take 1 tablet (10 mg total) by mouth daily. 30 tablet 11  . nitroGLYCERIN (NITROSTAT)  0.4 MG SL tablet Place 1 tablet (0.4 mg total) under the tongue every 5 (five) minutes as needed for chest pain. (Patient not taking: Reported on 06/08/2018) 25 tablet 1  . ondansetron (ZOFRAN) 4 MG tablet Take 1 tablet (4 mg  total) by mouth every 6 (six) hours. (Patient not taking: Reported on 06/20/2018) 6 tablet 0  . ONETOUCH DELICA LANCETS FINE MISC Use to test blood sugar up to twice a day (Patient not taking: Reported on 06/20/2018) 100 each prn  . rosuvastatin (CRESTOR) 40 MG tablet Take 1 tablet (40 mg total) by mouth daily. (Patient not taking: Reported on 06/08/2018) 90 tablet 3  . traZODone (DESYREL) 50 MG tablet Take 0.5-1 tablets (25-50 mg total) by mouth at bedtime as needed for sleep. (Patient not taking: Reported on 06/08/2018) 30 tablet 3   No current facility-administered medications on file prior to visit.     Allergies  Allergen Reactions  . Metformin And Related     Pt feels that this causes constipation and will not take     Family History  Problem Relation Age of Onset  . Diabetes Father   . Hypertension Father   . Heart attack Father   . Stroke Neg Hx     BP 132/72 (BP Location: Right Arm)   Pulse 71   Ht 5\' 9"  (1.753 m)   Wt 157 lb 12.8 oz (71.6 kg)   SpO2 95%   BMI 23.30 kg/m    Review of Systems denies weight loss, blurry vision, headache, chest pain, sob, n/v, urinary frequency, memory loss, depression, cold intolerance, rhinorrhea, hypoglycemia, and easy bruising.  He has leg cramps, and diaphoresis.      Objective:   Physical Exam VS: see vs page GEN: no distress HEAD: head: no deformity eyes: no periorbital swelling, no proptosis external nose and ears are normal mouth: no lesion seen NECK: supple, thyroid is not enlarged CHEST WALL: no deformity LUNGS: clear to auscultation CV: reg rate and rhythm, no murmur ABD: abdomen is soft, nontender.  no hepatosplenomegaly.  not distended.  no hernia MUSCULOSKELETAL: muscle bulk and strength are  grossly normal.  no obvious joint swelling.  gait is normal and steady EXTEMITIES: no deformity.  no ulcer on the feet.  feet are of normal color and temp.  Trace bilat leg edema.  There is bilateral onychomycosis of the toenails PULSES: dorsalis pedis intact bilat.  no carotid bruit NEURO:  cn 2-12 grossly intact.   readily moves all 4's.  sensation is intact to touch on the feet, but decreased from normal SKIN:  Normal texture and temperature.  No rash or suspicious lesion is visible.   NODES:  None palpable at the neck PSYCH: alert, well-oriented.  Does not appear anxious nor depressed.     Lab Results  Component Value Date   CREATININE 1.20 06/09/2018   BUN 14 06/09/2018   NA 139 06/09/2018   K 3.5 06/09/2018   CL 106 06/09/2018   CO2 24 06/09/2018   Lab Results  Component Value Date   HGBA1C 8.5 (A) 06/18/2018   I have reviewed outside records, and summarized: Pt was noted to have elevated a1c, and referred here.  He stopped metformin, due to diarrhea.        Assessment & Plan:  Type 2 DM, with CAD: new to me.  He needs increased rx abd bloating and diarrhea: these limits rx options   Patient Instructions  good diet and exercise significantly improve the control of your diabetes.  please let me know if you wish to be referred to a dietician.  high blood sugar is very risky to your health.  you should see an eye doctor and dentist every year.  It is very important to  get all recommended vaccinations.  Controlling your blood pressure and cholesterol drastically reduces the damage diabetes does to your body.  Those who smoke should quit.  Please discuss these with your doctor.  check your blood sugar once a day.  vary the time of day when you check, between before the 3 meals, and at bedtime.  also check if you have symptoms of your blood sugar being too high or too low.  please keep a record of the readings and bring it to your next appointment here (or you can bring the meter  itself).  You can write it on any piece of paper.  please call us sooner if your blood sugar goes below 70, or if you have a lot of readings over 200.   I have sent a prescription to your pharmacy, to resume the glipizide.   Please come back for a follow-up appointment in 2 months.    ??????????????????????????????????????????????????????????????????????????????????? ????????????????????????????????????????????????????????????????????? ???????????????????????????????????????????????????????? ????????????????????????????????????????????????????? ??????????????????????????????????????????????????????????????? ????????????????????????????????????????????????????????????????????????????????????????????????????? ?????????????????????????? ???????????????????????????????????????????????????? ???????????????????????????????????????????????? ??????????????????????????????????????????????????????????????????????????? ??????????????????????????????????????????????????????????????????????????????? ?????????????????????????????????????????????????????????????????????? (??????????????????????????) ? ?????????????????????????????? ?????????????????????????????????????????????????????????????? ?? ??????????????????????????? ??? ? ?????????????????????????????????????????????????????????????? ??????????????????????????????????????????? ? ???

## 2018-06-18 NOTE — Patient Instructions (Addendum)
good diet and exercise significantly improve the control of your diabetes.  please let me know if you wish to be referred to a dietician.  high blood sugar is very risky to your health.  you should see an eye doctor and dentist every year.  It is very important to get all recommended vaccinations.  Controlling your blood pressure and cholesterol drastically reduces the damage diabetes does to your body.  Those who smoke should quit.  Please discuss these with your doctor.  check your blood sugar once a day.  vary the time of day when you check, between before the 3 meals, and at bedtime.  also check if you have symptoms of your blood sugar being too high or too low.  please keep a record of the readings and bring it to your next appointment here (or you can bring the meter itself).  You can write it on any piece of paper.  please call us sooner if your blood sugar goes below 70, or if you have a lot of readings over 200.   I have sent a prescription to your pharmacy, to resume the glipizide.   Please come back for a follow-up appointment in 2 months.    ??????????????????????????????????????????????????????????????????????????????????? ????????????????????????????????????????????????????????????????????? ???????????????????????????????????????????????????????? ????????????????????????????????????????????????????? ??????????????????????????????????????????????????????????????? ????????????????????????????????????????????????????????????????????????????????????????????????????? ?????????????????????????? ???????????????????????????????????????????????????? ???????????????????????????????????????????????? ??????????????????????????????????????????????????????????????????????????? ??????????????????????????????????????????????????????????????????????????????? ?????????????????????????????????????????????????????????????????????? (??????????????????????????) ? ??????????????????????????????  ?????????????????????????????????????????????????????????????? ?? ??????????????????????????? ??? ? ?????????????????????????????????????????????????????????????? ??????????????????????????????????????????? ? ???

## 2018-06-20 ENCOUNTER — Other Ambulatory Visit: Payer: Self-pay

## 2018-06-20 ENCOUNTER — Ambulatory Visit (INDEPENDENT_AMBULATORY_CARE_PROVIDER_SITE_OTHER): Payer: Medicare Other | Admitting: Family Medicine

## 2018-06-20 ENCOUNTER — Encounter: Payer: Self-pay | Admitting: Family Medicine

## 2018-06-20 VITALS — BP 110/60 | HR 73 | Temp 97.9°F | Resp 16 | Ht 65.0 in | Wt 155.0 lb

## 2018-06-20 DIAGNOSIS — E118 Type 2 diabetes mellitus with unspecified complications: Secondary | ICD-10-CM

## 2018-06-20 DIAGNOSIS — Z09 Encounter for follow-up examination after completed treatment for conditions other than malignant neoplasm: Secondary | ICD-10-CM

## 2018-06-20 DIAGNOSIS — I2510 Atherosclerotic heart disease of native coronary artery without angina pectoris: Secondary | ICD-10-CM | POA: Diagnosis not present

## 2018-06-20 DIAGNOSIS — I10 Essential (primary) hypertension: Secondary | ICD-10-CM | POA: Diagnosis not present

## 2018-06-20 DIAGNOSIS — J189 Pneumonia, unspecified organism: Secondary | ICD-10-CM

## 2018-06-20 DIAGNOSIS — Z91199 Patient's noncompliance with other medical treatment and regimen due to unspecified reason: Secondary | ICD-10-CM

## 2018-06-20 DIAGNOSIS — Z9119 Patient's noncompliance with other medical treatment and regimen: Secondary | ICD-10-CM

## 2018-06-20 DIAGNOSIS — Z23 Encounter for immunization: Secondary | ICD-10-CM | POA: Diagnosis not present

## 2018-06-20 NOTE — Patient Instructions (Signed)
Good to see you today- it does seem like your pneumonia is getting better. Please let me know if you start to run fevers or otherwise are not doing ok  Please come in for an x-ray only in about 3 weeks to make sure that your lungs are all better  Continue to see Dr. Loanne Drilling about your diabetes Please see me in about 6 months to check in  Flu shot given today

## 2018-07-12 ENCOUNTER — Other Ambulatory Visit: Payer: Self-pay | Admitting: Physician Assistant

## 2018-07-12 ENCOUNTER — Other Ambulatory Visit: Payer: Self-pay | Admitting: Family Medicine

## 2018-07-12 DIAGNOSIS — I10 Essential (primary) hypertension: Secondary | ICD-10-CM

## 2018-08-13 ENCOUNTER — Other Ambulatory Visit: Payer: Self-pay

## 2018-08-20 ENCOUNTER — Ambulatory Visit (INDEPENDENT_AMBULATORY_CARE_PROVIDER_SITE_OTHER): Payer: Medicare Other | Admitting: Endocrinology

## 2018-08-20 ENCOUNTER — Encounter: Payer: Self-pay | Admitting: Endocrinology

## 2018-08-20 VITALS — BP 120/60 | HR 68 | Ht 65.0 in | Wt 159.4 lb

## 2018-08-20 DIAGNOSIS — E118 Type 2 diabetes mellitus with unspecified complications: Secondary | ICD-10-CM | POA: Diagnosis not present

## 2018-08-20 DIAGNOSIS — I2510 Atherosclerotic heart disease of native coronary artery without angina pectoris: Secondary | ICD-10-CM | POA: Diagnosis not present

## 2018-08-20 LAB — POCT GLYCOSYLATED HEMOGLOBIN (HGB A1C): Hemoglobin A1C: 7.4 % — AB (ref 4.0–5.6)

## 2018-08-20 MED ORDER — GLIPIZIDE 5 MG PO TABS: 5.0000 mg | ORAL_TABLET | Freq: Every day | ORAL | 2 refills | Status: DC

## 2018-08-20 MED ORDER — GLUCOSE BLOOD VI STRP: 1.0000 | ORAL_STRIP | Freq: Every day | 3 refills | Status: DC

## 2018-08-20 NOTE — Patient Instructions (Addendum)
Please continue the same glipizide. Please come back for a follow-up appointment in 4-5 months (when you come back from your trip). check your blood sugar once a day.  vary the time of day when you check, between before the 3 meals, and at bedtime.  also check if you have symptoms of your blood sugar being too high or too low.  please keep a record of the readings and bring it to your next appointment here (or you can bring the meter itself).  You can write it on any piece of paper.  please call us sooner if your blood sugar goes below 70, or if you have a lot of readings over 200.    ??????????????????????? ??????????????????????????????????????????? ?-? ?? (????????????????????????????????????????) ? ???????????????????????????????????????????????? ??????????????????????????????????????????????????????????????????????????? ??????????????????????????????????????????????????????????????????????????????? ?????????????????????????????????????????????????????????????????????? (??????????????????????????) ? ?????????????????????????????? ?????????????????????????????????????????????????????????????? ?? ??????????????????????????? ??? ?

## 2018-08-20 NOTE — Progress Notes (Signed)
Subjective:    Patient ID: Barry [name], male    DOB: [birth year], 67 y.o.   MRN: [id number]  HPI Pt returns for f/u of diabetes mellitus: DM type: 2 Dx'ed: 4196 Complications: polyneuropathy and CAD Therapy: glipizide DKA: never Severe hypoglycemia: never Pancreatitis: never Pancreatic imaging: normal on 2019 CT.  Other: he did not tolerate metformin or victoza (abd bloating and diarrhea) Interval history: pt states he feels well in general.  He does not want to change glipizide. He has hypoglycemia sxs (tremor) occasionally.  He cannot says how often.  He is out of strips.  He will go to [place] in 6 weeks, to stay x 3 months.    Past Medical History:  Diagnosis Date  . CAD (coronary artery disease)    a. LHC (02/26/14):  mLAD 20 (faint L>R collats), mCFX 50, pRCA 95 (plaque rupture) >> PCI:  Xience DES to pRCA (severe tortuosity of R innominate artery >> needs L radial or FA in future);  b. LHC (03/10/14):  CFX 90, oOM 70, pRCA stent patent >> PCI:  Xience DES to mCFX   . Carotid stenosis    a. Carotid US (02/27/14):  Bilateral ICA 1-39%  . Depression   . Diabetes mellitus   . 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[place])    a. Mesenteric Artery Duplex (7/15):  pSMA chronic dissection with aneurysmal dilation of 1.29 cm (VVS);  b.  Chest CTA (02/26/14):  IMPRESSION:  1. No aortic dissection or other acute abnormality.  2. 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  3. Stable 17 mm ectasia of the left common iliac artery.  4. Atherosclerosis, including aortoiliac and coronary artery disease.   Marland Kitchen History of echocardiogram 09/2015   Echo 1/17: mod LVH, EF 55%, inf-lat HK, Gr 1 DD, mild LAE  . Hx of cardiovascular stress test    Lexiscan Myoview (2/16):  Inferior lateral defect c/w thinning vs small prior infarct, no ischemia, EF 49%, Low Risk  . Hx of echocardiogram    a.  Echocardiogram (02/27/14):  Mod focal basal and mild concentric LVH, EF 50-55%, no RWMA, Gr 1 DD, mild TR, normal RVF  .  Hyperlipidemia   . Hypertension   . Iliac artery aneurysm, left ([place])   . Myocardial infarction ([place])   . Stroke [facility]) 09/26/2009    Past Surgical History:  Procedure Laterality Date  . CARDIAC CATHETERIZATION    . CARDIAC CATHETERIZATION N/A 06/25/2015   Procedure: Left Heart Cath and Coronary Angiography;  Surgeon: Burnell Blanks, MD;  Location: [place] CV LAB;  Service: Cardiovascular;  Laterality: N/A;  . COLONOSCOPY WITH PROPOFOL N/A 07/23/2015   Procedure: COLONOSCOPY WITH PROPOFOL;  Surgeon: [name], MD;  Location: [facility];  Service: Endoscopy;  Laterality: N/A;  . CORONARY ANGIOPLASTY    . LEFT HEART CATHETERIZATION WITH CORONARY ANGIOGRAM N/A 02/26/2014   Procedure: LEFT HEART CATHETERIZATION WITH CORONARY ANGIOGRAM;  Surgeon: Wellington Hampshire, MD;  Location: [place] CATH LAB;  Service: Cardiovascular;  Laterality: N/A;  . LEFT HEART CATHETERIZATION WITH CORONARY ANGIOGRAM N/A 03/10/2014   Procedure: LEFT HEART CATHETERIZATION WITH CORONARY ANGIOGRAM;  Surgeon: Jettie Booze, MD;  Location: [facility] CATH LAB;  Service: Cardiovascular;  Laterality: N/A;    Social History   Socioeconomic History  . Marital status: Married    Spouse name: Not on file  . Number of children: 3  . Years of education: Not on file  . Highest education level: Not on file  Occupational History  .  Occupation: Retired  Scientific laboratory technician  . Financial resource strain: Not on file  . Food insecurity:    Worry: Not on file    Inability: Not on file  . Transportation needs:    Medical: Not on file    Non-medical: Not on file  Tobacco Use  . Smoking status: Former Smoker    Packs/day: 0.25    Years: 38.00    Pack years: 9.50    Types: Cigarettes    Last attempt to quit: 2008    Years since quitting: 11.9  . Smokeless tobacco: Never Used  Substance and Sexual Activity  . Alcohol use: No    Alcohol/week: 0.0 standard drinks  . Drug use: No  . Sexual activity: Yes  Lifestyle  .  Physical activity:    Days per week: Not on file    Minutes per session: Not on file  . Stress: Not on file  Relationships  . Social connections:    Talks on phone: Not on file    Gets together: Not on file    Attends religious service: Not on file    Active member of club or organization: Not on file    Attends meetings of clubs or organizations: Not on file    Relationship status: Not on file  . Intimate partner violence:    Fear of current or ex partner: Not on file    Emotionally abused: Not on file    Physically abused: Not on file    Forced sexual activity: Not on file  Other Topics Concern  . Not on file  Social History Narrative   Marital: married.      Lives: with son,wife.       Children: 3 children; 6 grandchildren      Employed: unemployed; disability unknown reason/CVA L sided weakness      Tobacco:  Quit 2012; smoked 40 years.       Alcohol: no drinking now; social in past.       Drugs: none       Exercise: sporadic.       ADLs:  No driving since CVA.    Current Outpatient Medications on File Prior to Visit  Medication Sig Dispense Refill  . Alirocumab (PRALUENT) 75 MG/ML SOPN Inject 1 pen into the skin every 14 (fourteen) days. 2 pen 11  . amLODipine (NORVASC) 5 MG tablet Take 1 tablet (5 mg total) by mouth daily. 90 tablet 3  . aspirin EC 81 MG EC tablet Take 1 tablet (81 mg total) by mouth daily.    Marland Kitchen gabapentin (NEURONTIN) 300 MG capsule Take 1 capsule (300 mg total) by mouth at bedtime. 90 capsule 3  . lisinopril (PRINIVIL,ZESTRIL) 10 MG tablet Take 1 tablet (10 mg total) by mouth daily. Please call and schedule an appointment 90 tablet 1  . nitroGLYCERIN (NITROSTAT) 0.4 MG SL tablet Place 1 tablet (0.4 mg total) under the tongue every 5 (five) minutes as needed for chest pain. 25 tablet 1  . rosuvastatin (CRESTOR) 40 MG tablet Take 1 tablet (40 mg total) by mouth daily. 90 tablet 3  . traZODone (DESYREL) 50 MG tablet Take 0.5-1 tablets (25-50 mg total) by  mouth at bedtime as needed for sleep. 30 tablet 3  . ondansetron (ZOFRAN) 4 MG tablet Take 1 tablet (4 mg total) by mouth every 6 (six) hours. (Patient not taking: Reported on 08/20/2018) 6 tablet 0   No current facility-administered medications on file prior to visit.  Allergies  Allergen Reactions  . Metformin And Related     Pt feels that this causes constipation and will not take     Family History  Problem Relation Age of Onset  . Diabetes Father   . Hypertension Father   . Heart attack Father   . Stroke Neg Hx     BP 120/60 (BP Location: Left Arm, Patient Position: Sitting, Cuff Size: Normal)   Pulse 68   Ht 5\' 5"  (1.651 m)   Wt 159 lb 6.4 oz (72.3 kg)   SpO2 97%   BMI 26.53 kg/m   Review of Systems He denies LOC.      Objective:   Physical Exam VITAL SIGNS:  See vs page GENERAL: no distress Pulses: dorsalis pedis intact bilat.   MSK: no deformity of the feet CV: no leg edema Skin:  no ulcer on the feet, but the skin is dry.  normal color and temp on the feet. Neuro: sensation is intact to touch on the feet, but decreased from normal   A1c=7.4%     Assessment & Plan:  Type 2 DM, with CAD: this is the best control this pt should aim for, given this SU-containing regimen. Hypoglycemia: he should confirm with cbg monitoring.  Patient Instructions  Please continue the same glipizide. Please come back for a follow-up appointment in 4-5 months (when you come back from your trip). check your blood sugar once a day.  vary the time of day when you check, between before the 3 meals, and at bedtime.  also check if you have symptoms of your blood sugar being too high or too low.  please keep a record of the readings and bring it to your next appointment here (or you can bring the meter itself).  You can write it on any piece of paper.  please call us sooner if your blood sugar goes below 70, or if you have a lot of readings over 200.    ???????????????????????  ??????????????????????????????????????????? ?-? ?? (????????????????????????????????????????) ? ???????????????????????????????????????????????? ??????????????????????????????????????????????????????????????????????????? ??????????????????????????????????????????????????????????????????????????????? ?????????????????????????????????????????????????????????????????????? (??????????????????????????) ? ?????????????????????????????? ?????????????????????????????????????????????????????????????? ?? ??????????????????????????? ??? ?

## 2018-08-27 ENCOUNTER — Telehealth: Payer: Self-pay | Admitting: Family Medicine

## 2018-08-27 NOTE — Telephone Encounter (Signed)
MEDICATION:   Patient called stating that his father is [place] next month for four months and needs a four month supply of

## 2018-08-28 ENCOUNTER — Other Ambulatory Visit: Payer: Self-pay | Admitting: Family Medicine

## 2018-09-17 ENCOUNTER — Other Ambulatory Visit: Payer: Self-pay | Admitting: Pharmacist

## 2018-09-17 MED ORDER — ALIROCUMAB 75 MG/ML ~~LOC~~ SOAJ: 75.0000 mg | SUBCUTANEOUS | 3 refills | Status: DC

## 2018-09-27 ENCOUNTER — Telehealth: Payer: Self-pay

## 2018-09-27 NOTE — Telephone Encounter (Signed)
Called to ask pt for insurance changes pt son stated can not get the info right now but will call us back

## 2018-09-28 MED ORDER — ALIROCUMAB 75 MG/ML ~~LOC~~ SOAJ: 75.0000 mg | SUBCUTANEOUS | 11 refills | Status: DC

## 2018-09-28 NOTE — Telephone Encounter (Signed)
When prior authorization submission was attempted, response was "Test claim rejects for early refill and does not indicate a prior authorization is needed for the requested medication/quantity under the patient's Part D prescription benefit. If you have questions or need further assistance, please call the number on the back of the patient's insurance card."  Praluent should be available without prior authorization. New refill sent into pharmacy.

## 2018-12-24 ENCOUNTER — Other Ambulatory Visit: Payer: Self-pay | Admitting: Physician Assistant

## 2018-12-24 DIAGNOSIS — I10 Essential (primary) hypertension: Secondary | ICD-10-CM

## 2019-04-05 ENCOUNTER — Other Ambulatory Visit: Payer: Self-pay | Admitting: Physician Assistant

## 2019-04-05 DIAGNOSIS — I10 Essential (primary) hypertension: Secondary | ICD-10-CM

## 2019-04-29 ENCOUNTER — Telehealth: Payer: Self-pay | Admitting: *Deleted

## 2019-04-29 NOTE — Telephone Encounter (Signed)
Copied from [place] [phone]. Topic: Quick Communication - See Telephone Encounter >> Apr 29, 2019  4:57 PM Loma [place] wrote: CRM for notification. See Telephone encounter for: 04/29/19. Pt son's 336 [phone] Pros the son, needs to make an appt for father with Dr Lorelei Pont.

## 2019-05-06 ENCOUNTER — Ambulatory Visit: Payer: Medicare Other | Admitting: Family Medicine

## 2019-05-08 ENCOUNTER — Telehealth: Payer: Self-pay | Admitting: Family Medicine

## 2019-05-08 NOTE — Telephone Encounter (Signed)
Son states pt has broken his glucose meter, one touch ultra. Would like to know if you can order him another?  Would like as soon as possible.  [place] ([address] [place] [phone] (Phone) [phone] (Fax)

## 2019-05-10 MED ORDER — BLOOD GLUCOSE METER KIT: PACK | 0 refills | Status: DC

## 2019-05-10 NOTE — Telephone Encounter (Signed)
New kit has been sent to pharmacy.

## 2019-05-16 ENCOUNTER — Other Ambulatory Visit: Payer: Self-pay | Admitting: Physician Assistant

## 2019-05-16 DIAGNOSIS — I10 Essential (primary) hypertension: Secondary | ICD-10-CM

## 2019-06-19 ENCOUNTER — Other Ambulatory Visit: Payer: Self-pay | Admitting: Physician Assistant

## 2019-06-19 DIAGNOSIS — I10 Essential (primary) hypertension: Secondary | ICD-10-CM

## 2019-07-08 ENCOUNTER — Other Ambulatory Visit: Payer: Self-pay

## 2019-07-08 NOTE — Patient Outreach (Signed)
[place] [facility]) Care Management  07/08/2019  Gaetano Mckendry 08/30/1951 [id number]   Medication Adherence call to Mr. Daviyon Adamczak Hippa Identifiers Verify spoke with patients son patient is past due on Lisinopril 10 mg he explain he takes 1 tablet daily and has medication at this time patient explain he receives then from [place] and will order when due. Mr. Dicarlo is showing past due under [place].   [place] Management Direct Dial [phone]  Fax [phone] [email or web address]

## 2019-09-19 ENCOUNTER — Other Ambulatory Visit: Payer: Self-pay

## 2019-09-19 NOTE — Patient Outreach (Signed)
[place] [facility]) Care Management  09/19/2019  Korvin Mallek July 31, 1951 [id number]   Medication Adherence call to Mr. Gedeon Showman Hippa Identifiers Verify spoke with patients son,patient is past due on Glipizide 5 mg ,son explain patient takes 1 tablet daily,patient has pick up from the pharmacy on 07/2019 for a 90 days supply,patient has enough until January/2021. Mr. Boyington is showing past due under [place].   [place] Management Direct Dial [phone]  Fax [phone] [email or web address]

## 2019-09-30 ENCOUNTER — Other Ambulatory Visit: Payer: Self-pay

## 2019-09-30 NOTE — Patient Outreach (Signed)
[place] [facility]) Care Management  09/30/2019  Peng Safran [birth year] [id number]   Medication Adherence call to Mr. Barry [name] patient did not answer patient is past due on Losartan 100 mg under [place].  [place] Management Direct Dial [phone]  Fax [phone] [email or web address]

## 2019-10-03 ENCOUNTER — Other Ambulatory Visit: Payer: Self-pay | Admitting: Pharmacist

## 2019-10-03 MED ORDER — PRALUENT 75 MG/ML ~~LOC~~ SOAJ: 75.0000 mg | SUBCUTANEOUS | 11 refills | Status: DC

## 2019-10-25 ENCOUNTER — Other Ambulatory Visit: Payer: Self-pay | Admitting: Endocrinology

## 2019-10-25 ENCOUNTER — Other Ambulatory Visit: Payer: Self-pay | Admitting: Physician Assistant

## 2019-10-25 ENCOUNTER — Telehealth: Payer: Self-pay | Admitting: Physician Assistant

## 2019-10-25 DIAGNOSIS — I10 Essential (primary) hypertension: Secondary | ICD-10-CM

## 2019-10-25 DIAGNOSIS — E118 Type 2 diabetes mellitus with unspecified complications: Secondary | ICD-10-CM

## 2019-10-25 NOTE — Telephone Encounter (Signed)
*  STAT* If patient is at the pharmacy, call can be transferred to refill team.   1. Which medications need to be refilled? (please list name of each medication and dose if known) lisinopril (ZESTRIL) 10 MG tablet  2. Which pharmacy/location (including street and city if local pharmacy) is medication to be sent to? [place] ([place]), [place] [address]  3. Do they need a 30 day or 90 day supply? 90 day  Patient is out of medication and has an appointment 2/2.

## 2019-10-26 NOTE — Telephone Encounter (Signed)
1.  Please schedule f/u appt 2.  Then please refill x 1, pending that appt.  

## 2019-10-28 NOTE — Telephone Encounter (Signed)
Called and used interpreter to schedule patient's appt, patient still did not quite understand which doctor this was and what office we were, [place] he asked if we contact his son to schedule this appointment. I called and left a voicemail to son.

## 2019-10-28 NOTE — Telephone Encounter (Signed)
Per Dr. Loanne Drilling, unable to refill Glipizide without an appt. Routing this message to the front desk for scheduling purposes.

## 2019-10-28 NOTE — Progress Notes (Signed)
Cardiology Office Note:    Date:  10/29/2019   ID:  Barry [name], DOB [birth year], MRN [id number]  PCP:  Darreld Mclean, MD  Cardiologist:  Sherren Mocha, MD / Richardson Dopp, PA-C  Electrophysiologist:  None   Referring MD: Darreld Mclean, MD   Chief Complaint:  Follow-up (CAD)    Patient Profile:    Barry [name] is a 69 y.o. [place] male with:   Coronary artery disease  S/p NSTEMI 02/2014>> s/p DES to the proximal RCA  Re-Admx 02/2014 w/ [place]>> s/p DES to the LCx  Myoview 2/16: Low risk, inferior thinning versus infarct, no ischemia  Cath 9/16: LCx, RCA stents ok; OM2 subbranch jailed, oPDA moderate stenosis>> med Rx  Chronic superior mesenteric artery dissection  Followed by Dr. Trula Slade  Peripheral Arterial Disease    L CIA w penetrating ulcer  History of stroke in 2011  Diabetes mellitus  Hypertension  Chronic kidney disease  Hyperlipidemia  Rosuvastatin; Alirocumab  Abdominal aortic aneurysm   Prior CV studies: Echo 10/27/15 Mod LVH, EF 55, inf-lat HK, Gr 1 DD, trivial AI, mild LAE  LHC 9/16 LAD: Ostial 30%, prox 30%, dist 30%, D1 30% LCx: prox 20%, OM2 stent ok with 10% ISR, small lateral OM2 80% RCA: Ostial stent ok with 15%, mid 20%, dist 20%, ostial RPDA 60% EF 55-65%  Myoview 2/16 Inferior lateral defect consistent with thinning vs small prior infarct; no ischemia. EF 49%   History of Present Illness:    Mr. Kuenzi was last seen in 01/2018 by Leanor Kail, PA-C.  He returns for follow up.   He is seen with the assistance of an interpreter.  He has been doing well without chest pain, shortness of breath, syncope, orthopnea, syncope.  He has not had significant abdominal pain.  He is not sure about many of his medications and only brought his glipizide and lisinopril with him.     Past Medical History:  Diagnosis Date  . CAD (coronary artery disease)    a. LHC (02/26/14):  mLAD 20 (faint L>R collats), mCFX 50, pRCA 95 (plaque rupture) >>  PCI:  Xience DES to pRCA (severe tortuosity of R innominate artery >> needs L radial or FA in future);  b. LHC (03/10/14):  CFX 90, oOM 70, pRCA stent patent >> PCI:  Xience DES to mCFX   . Carotid stenosis    a. Carotid US (02/27/14):  Bilateral ICA 1-39%  . Depression   . Diabetes mellitus   . 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[place])    a. Mesenteric Artery Duplex (7/15):  pSMA chronic dissection with aneurysmal dilation of 1.29 cm (VVS);  b.  Chest CTA (02/26/14):  IMPRESSION:  1. No aortic dissection or other acute abnormality.  2. 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  3. Stable 17 mm ectasia of the left common iliac artery.  4. Atherosclerosis, including aortoiliac and coronary artery disease.   Marland Kitchen History of echocardiogram 09/2015   Echo 1/17: mod LVH, EF 55%, inf-lat HK, Gr 1 DD, mild LAE  . Hx of cardiovascular stress test    Lexiscan Myoview (2/16):  Inferior lateral defect c/w thinning vs small prior infarct, no ischemia, EF 49%, Low Risk  . Hx of echocardiogram    a.  Echocardiogram (02/27/14):  Mod focal basal and mild concentric LVH, EF 50-55%, no RWMA, Gr 1 DD, mild TR, normal RVF  . Hyperlipidemia   . Hypertension   . Iliac artery aneurysm, left ([place])   . Myocardial  infarction ([place])   . Stroke ([place]) 09/26/2009    Current Medications: Current Meds  Medication Sig  . Alirocumab (PRALUENT) 75 MG/ML SOAJ Inject 75 mg into the skin every 14 (fourteen) days.  Marland Kitchen aspirin EC 81 MG EC tablet Take 1 tablet (81 mg total) by mouth daily.  . blood glucose meter kit and supplies Dispense based on patient and insurance preference. Use up to four times daily as directed. (FOR ICD-10 E10.9, E11.9).  Marland Kitchen gabapentin (NEURONTIN) 300 MG capsule Take 1 capsule (300 mg total) by mouth at bedtime.  Marland Kitchen glipiZIDE (GLUCOTROL) 5 MG tablet Take 1 tablet (5 mg total) by mouth daily before breakfast.  . glucose blood (ONE TOUCH ULTRA TEST) test strip 1 each by Other route daily. And lancets 1/day  .  nitroGLYCERIN (NITROSTAT) 0.4 MG SL tablet Place 1 tablet (0.4 mg total) under the tongue every 5 (five) minutes as needed for chest pain.  Marland Kitchen ondansetron (ZOFRAN) 4 MG tablet Take 1 tablet (4 mg total) by mouth every 6 (six) hours.  . TECHLITE PEN NEEDLES 32G X 8 MM MISC USE ONE PEN NEEDLE TO CHECK BLOOD GLUCOSE ONCE DAILY  . [DISCONTINUED] amLODipine (NORVASC) 5 MG tablet Take 1 tablet (5 mg total) by mouth daily.  . [DISCONTINUED] lisinopril (ZESTRIL) 10 MG tablet Take 1 tablet (10 mg total) by mouth daily.  . [DISCONTINUED] rosuvastatin (CRESTOR) 40 MG tablet Take 1 tablet (40 mg total) by mouth daily.  . [DISCONTINUED] traZODone (DESYREL) 50 MG tablet Take 0.5-1 tablets (25-50 mg total) by mouth at bedtime as needed for sleep.     Allergies:   Metformin and related   Social History   Tobacco Use  . Smoking status: Former Smoker    Packs/day: 0.25    Years: 38.00    Pack years: 9.50    Types: Cigarettes    Quit date: 2008    Years since quitting: 13.0  . Smokeless tobacco: Never Used  Substance Use Topics  . Alcohol use: No    Alcohol/week: 0.0 standard drinks  . Drug use: No     Family Hx: The patient's family history includes Diabetes in his father; Heart attack in his father; Hypertension in his father. There is no history of Stroke.  Review of Systems  HENT: Positive for hoarse voice (ongoing for several years).   Gastrointestinal: Negative for hematochezia and melena.  Genitourinary: Negative for hematuria.     EKGs/Labs/Other Test Reviewed:    EKG:  EKG is   ordered today.  The ekg ordered today demonstrates normal sinus rhythm, HR 67, normal axis, septal Q waves, non-specific ST-TW changes, QTc 424 ms, no change from prior tracing   Recent Labs: No results found for requested labs within last 8760 hours.   Recent Lipid Panel Lab Results  Component Value Date/Time   CHOL 140 03/19/2018 10:03 AM   CHOL 253 (H) 11/16/2017 10:25 AM   TRIG 168.0 (H) 03/19/2018  10:03 AM   HDL 50.00 03/19/2018 10:03 AM   HDL 40 11/16/2017 10:25 AM   CHOLHDL 3 03/19/2018 10:03 AM   LDLCALC 56 03/19/2018 10:03 AM   LDLCALC 160 (H) 11/16/2017 10:25 AM   LDLDIRECT 101.0 04/20/2015 09:51 AM     Physical Exam:    VS:  BP (!) 160/88   Pulse 67   Ht 5' 5"  (1.651 m)   Wt 162 lb 9.6 oz (73.8 kg)   SpO2 96%   BMI 27.06 kg/m     Wt Readings  from Last 3 Encounters:  10/29/19 162 lb 9.6 oz (73.8 kg)  08/20/18 159 lb 6.4 oz (72.3 kg)  06/20/18 155 lb (70.3 kg)     Constitutional:      Appearance: Healthy appearance. Not in distress.  Neck:     Thyroid: Thyroid normal.     Vascular: JVD normal.  Pulmonary:     Effort: Pulmonary effort is normal.     Breath sounds: No wheezing. No rales.  Cardiovascular:     Normal rate. Regular rhythm. Normal S1. Normal S2.     Murmurs: There is no murmur.  Edema:    Peripheral edema absent.  Abdominal:     Palpations: Abdomen is soft. There is no hepatomegaly.  Skin:    General: Skin is warm and dry.  Neurological:     General: No focal deficit present.     Mental Status: Alert and oriented to person, place and time.     Cranial Nerves: Cranial nerves are intact.       ASSESSMENT & PLAN:    1. Coronary artery disease involving native coronary artery of native heart without angina pectoris S/p NSTEMI June 2015 treated with drug-eluting stent to the proximal RCA and subsequent drug-eluting stent to the LCx. Myoview in 2016 was low risk. Cardiac catheterization in 2016 demonstrated patent stents in the RCA and LCx. The second obtuse marginal was jailed by the stent and there was moderate stenosis in the ostial PDA. Residual disease has been managed medically. He is currently doing well without chest discomfort to suggest angina. Unfortunately, he is not really aware of most of his medications. He does admit to taking aspirin daily. He should continue aspirin 81 mg daily. It also sounds as though he continues on Alirocumab  injections. This should also be continued.  2. Essential hypertension Blood pressure is uncontrolled. As noted, I am not certain which medicines he is really taking aside from lisinopril 10 mg daily. Therefore, I will increase his lisinopril to 20 mg daily. I will arrange a follow-up BMP in 2 weeks. I will see him back in 3 weeks. I have asked him to bring all his medications with him. If his blood pressure remains uncontrolled, resume amlodipine.  3. Mixed hyperlipidemia As noted, it sounds as though he continues on Alirocumab. This should be continued. I will arrange fasting lipids and LFTs. I will review these with him at follow-up. Of note, he was previously on rosuvastatin 40 mg daily in addition to Alirocumab 75 mg every 14 days. I have asked him to bring all of his medications with him. If his LDL is above target and he is not on rosuvastatin, rosuvastatin will be resumed.  4. 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[place]) 5. PAD (peripheral artery disease) ([place]) He continues follow-up with vascular surgery.   Dispo:  Return in about 3 weeks (around 11/19/2019) for Close Follow Up, w/ Richardson Dopp, PA-C.  He was previously followed by Dr. Aundra Dubin (now in [place] clinic) and then Dr. Saunders Revel (now in [facility] clinic). I will continue to follow him in the [facility] office along with Dr. Burt Knack.  Medication Adjustments/Labs and Tests Ordered: Current medicines are reviewed at length with the patient today.  Concerns regarding medicines are outlined above.  Tests Ordered: Orders Placed This Encounter  Procedures  . Basic metabolic panel  . Lipid panel  . Hepatic function panel  . EKG 12-Lead   Medication Changes: Meds ordered this encounter  Medications  . lisinopril (ZESTRIL) 20 MG  tablet    Sig: Take 1 tablet (20 mg total) by mouth daily.    Dispense:  30 tablet    Refill:  11    Order Specific Question:   Supervising Provider    Answer:   Lelon Perla [[id number]]    Signed, Richardson Dopp, PA-C  10/29/2019 3:52 PM    [facility] [place], [place], [place]  [address] Phone: [phone]; Fax: [phone]

## 2019-10-29 ENCOUNTER — Ambulatory Visit: Payer: Medicare Other | Admitting: Physician Assistant

## 2019-10-29 ENCOUNTER — Other Ambulatory Visit: Payer: Self-pay

## 2019-10-29 ENCOUNTER — Encounter: Payer: Self-pay | Admitting: Physician Assistant

## 2019-10-29 VITALS — BP 160/88 | HR 67 | Ht 65.0 in | Wt 162.6 lb

## 2019-10-29 DIAGNOSIS — E782 Mixed hyperlipidemia: Secondary | ICD-10-CM | POA: Diagnosis not present

## 2019-10-29 DIAGNOSIS — I10 Essential (primary) hypertension: Secondary | ICD-10-CM | POA: Diagnosis not present

## 2019-10-29 DIAGNOSIS — I2510 Atherosclerotic heart disease of native coronary artery without angina pectoris: Secondary | ICD-10-CM | POA: Diagnosis not present

## 2019-10-29 DIAGNOSIS — I739 Peripheral vascular disease, unspecified: Secondary | ICD-10-CM

## 2019-10-29 DIAGNOSIS — I7779 Dissection of other artery: Secondary | ICD-10-CM

## 2019-10-29 DIAGNOSIS — E118 Type 2 diabetes mellitus with unspecified complications: Secondary | ICD-10-CM

## 2019-10-29 MED ORDER — GLIPIZIDE 5 MG PO TABS: 5.0000 mg | ORAL_TABLET | Freq: Every day | ORAL | 0 refills | Status: DC

## 2019-10-29 MED ORDER — LISINOPRIL 20 MG PO TABS: 20.0000 mg | ORAL_TABLET | Freq: Every day | ORAL | 11 refills | Status: DC

## 2019-10-29 NOTE — Telephone Encounter (Signed)
Outpatient Medication Detail   Disp Refills Start End   glipiZIDE (GLUCOTROL) 5 MG tablet 30 tablet 0 10/29/2019    Sig - Route: Take 1 tablet (5 mg total) by mouth daily before breakfast. - Oral   Sent to pharmacy as: glipiZIDE (GLUCOTROL) 5 MG tablet   E-Prescribing Status: Receipt confirmed by pharmacy (10/29/2019 11:38 AM EST)

## 2019-10-29 NOTE — Patient Instructions (Signed)
Medication Instructions:  Increase Lisinopril to 20 mg once daily Take 2 of the 10 mg tablets in the morning each day to equal 20 mg A new prescription has been sent in to your pharmacy for 20 mg tablets  *If you need a refill on your cardiac medications before your next appointment, please call your pharmacy*  Lab Work: In 2 weeks Fasting Lipids, BMET, LFTs  If you have labs (blood work) drawn today and your tests are completely normal, you will receive your results only by: Marland Kitchen MyChart Message (if you have MyChart) OR . A paper copy in the mail If you have any lab test that is abnormal or we need to change your treatment, we will call you to review the results.  Testing/Procedures:    Follow-Up: At [facility], you and your health needs are our priority.  As part of our continuing mission to provide you with exceptional heart care, we have created designated Provider Care Teams.  These Care Teams include your primary Cardiologist (physician) and Advanced Practice Providers (APPs -  Physician Assistants and Nurse Practitioners) who all work together to provide you with the care you need, when you need it.  Your next appointment:   3 week(s)  The format for your next appointment:   In Person  Provider:   Richardson Dopp, PA-C  Other Instructions  Bring ALL of your medications to your next visit.  This is very important.

## 2019-10-29 NOTE — Telephone Encounter (Signed)
Patient scheduled 11/05/19-FYI

## 2019-11-04 ENCOUNTER — Other Ambulatory Visit: Payer: Self-pay

## 2019-11-05 ENCOUNTER — Ambulatory Visit: Payer: Medicare Other | Admitting: Endocrinology

## 2019-11-05 DIAGNOSIS — Z0289 Encounter for other administrative examinations: Secondary | ICD-10-CM

## 2019-11-14 ENCOUNTER — Other Ambulatory Visit: Payer: Medicare Other

## 2019-11-21 ENCOUNTER — Other Ambulatory Visit: Payer: Medicare Other | Admitting: *Deleted

## 2019-11-21 ENCOUNTER — Other Ambulatory Visit: Payer: Self-pay

## 2019-11-21 DIAGNOSIS — I10 Essential (primary) hypertension: Secondary | ICD-10-CM | POA: Diagnosis not present

## 2019-11-21 DIAGNOSIS — I2510 Atherosclerotic heart disease of native coronary artery without angina pectoris: Secondary | ICD-10-CM

## 2019-11-21 DIAGNOSIS — I739 Peripheral vascular disease, unspecified: Secondary | ICD-10-CM

## 2019-11-21 DIAGNOSIS — E782 Mixed hyperlipidemia: Secondary | ICD-10-CM

## 2019-11-21 LAB — HEPATIC FUNCTION PANEL
ALT: 18 IU/L (ref 0–44)
AST: 19 IU/L (ref 0–40)
Albumin: 4.4 g/dL (ref 3.8–4.8)
Alkaline Phosphatase: 94 IU/L (ref 39–117)
Bilirubin Total: 0.4 mg/dL (ref 0.0–1.2)
Bilirubin, Direct: 0.12 mg/dL (ref 0.00–0.40)
Total Protein: 6.8 g/dL (ref 6.0–8.5)

## 2019-11-21 LAB — BASIC METABOLIC PANEL
BUN/Creatinine Ratio: 12 (ref 10–24)
BUN: 16 mg/dL (ref 8–27)
CO2: 23 mmol/L (ref 20–29)
Calcium: 9.7 mg/dL (ref 8.6–10.2)
Chloride: 100 mmol/L (ref 96–106)
Creatinine, Ser: 1.38 mg/dL — ABNORMAL HIGH (ref 0.76–1.27)
GFR calc Af Amer: 60 mL/min/{1.73_m2} (ref >59)
GFR calc non Af Amer: 52 mL/min/{1.73_m2} — ABNORMAL LOW (ref >59)
Glucose: 250 mg/dL — ABNORMAL HIGH (ref 65–99)
Potassium: 3.8 mmol/L (ref 3.5–5.2)
Sodium: 139 mmol/L (ref 134–144)

## 2019-11-21 LAB — LIPID PANEL
Chol/HDL Ratio: 4.7 ratio (ref 0.0–5.0)
Cholesterol, Total: 184 mg/dL (ref 100–199)
HDL: 39 mg/dL — ABNORMAL LOW (ref >39)
LDL Chol Calc (NIH): 117 mg/dL — ABNORMAL HIGH (ref 0–99)
Triglycerides: 157 mg/dL — ABNORMAL HIGH (ref 0–149)
VLDL Cholesterol Cal: 28 mg/dL (ref 5–40)

## 2019-11-27 ENCOUNTER — Telehealth: Payer: Self-pay

## 2019-11-27 DIAGNOSIS — E785 Hyperlipidemia, unspecified: Secondary | ICD-10-CM

## 2019-11-27 DIAGNOSIS — I2510 Atherosclerotic heart disease of native coronary artery without angina pectoris: Secondary | ICD-10-CM

## 2019-11-27 DIAGNOSIS — E782 Mixed hyperlipidemia: Secondary | ICD-10-CM

## 2019-11-27 NOTE — Telephone Encounter (Signed)
-----   Message from Liliane Shi, [place] sent at 11/22/2019 12:57 PM EST ----- LFTs, K normal.  Triglycerides, glucose elevated.  LDL above target.  Creatinine increased. PLAN:   - Pt did not know what medications he was on at last visit.  - Please have pt confirm his medications and correct them in chart.  - He should be on Rosuvastatin 40 mg once daily - If he is not taking, resume and arrange fasting Lipids and LFTs in 3 mos.  - FU with PCP for diabetes mellitus.  - Repeat BMET 2 weeks Richardson Dopp, PA-C    11/22/2019 12:50 PM

## 2019-11-27 NOTE — Telephone Encounter (Signed)
I called and spoke with patients son Pros. He said that the patient is on Praluent for cholesterol but he has not had that in about a month. The patients son broke his phone and lost information for Praluent refills. Does patient need to be on both Praluent and Rosuvastatin? Or just one of those medications?

## 2019-11-27 NOTE — Telephone Encounter (Signed)
Yes.  He should be on Rosuvastatin 40 mg once daily and Praluent. Resume both and repeat Fasting Lipids and LFTs in 3 mos. Richardson Dopp, PA-C    11/27/2019 7:59 PM

## 2019-11-28 MED ORDER — ROSUVASTATIN CALCIUM 40 MG PO TABS: 40.0000 mg | ORAL_TABLET | Freq: Every day | ORAL | 3 refills | Status: DC

## 2019-11-28 NOTE — Telephone Encounter (Signed)
I called and spoke with Pros, he is aware that patient should be on Praluent and Rosuvastatin. RX for Rosuvastatin sent to pharmacy and patients son Pros states that he called to order Praluent yesterday. Repeat labs scheduled for 03/02/20.

## 2019-12-03 ENCOUNTER — Other Ambulatory Visit: Payer: Self-pay | Admitting: Endocrinology

## 2019-12-03 DIAGNOSIS — E118 Type 2 diabetes mellitus with unspecified complications: Secondary | ICD-10-CM

## 2019-12-07 ENCOUNTER — Other Ambulatory Visit: Payer: Self-pay | Admitting: Endocrinology

## 2019-12-07 DIAGNOSIS — E118 Type 2 diabetes mellitus with unspecified complications: Secondary | ICD-10-CM

## 2019-12-07 NOTE — Telephone Encounter (Signed)
1.  Please schedule f/u appt 2.  Then please refill x 1, pending that appt.  

## 2019-12-09 NOTE — Telephone Encounter (Signed)
ATC no voicemail, no answer.

## 2019-12-11 ENCOUNTER — Ambulatory Visit: Payer: Medicare Other | Admitting: Physician Assistant

## 2019-12-11 ENCOUNTER — Other Ambulatory Visit: Payer: Self-pay | Admitting: Endocrinology

## 2019-12-11 ENCOUNTER — Other Ambulatory Visit: Payer: Self-pay

## 2019-12-11 DIAGNOSIS — E118 Type 2 diabetes mellitus with unspecified complications: Secondary | ICD-10-CM

## 2019-12-11 NOTE — Patient Outreach (Signed)
[place] [facility]) Care Management  12/11/2019  Zachrey Andrades [birth year] [id number]   Medication Adherence call to Mr. 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 Mr. Bohlin is showing past due under [place].  [place] Management Direct Dial [phone]  Fax [phone] [email or web address]

## 2019-12-12 NOTE — Progress Notes (Signed)
Cardiology Office Note:    Date:  12/13/2019   ID:  Barry [name], DOB 08-08-1951, MRN [id number]  PCP:  Darreld Mclean, MD  Cardiologist:  Sherren Mocha, MD / Richardson Dopp, PA-C  Electrophysiologist:  None   Referring MD: Darreld Mclean, MD   Chief Complaint:  Follow-up (CAD, HTN)    Patient Profile:    Barry [name] is a 69 y.o. male with:   Coronary artery disease ? S/p NSTEMI 02/2014>> s/p DES to the proximal RCA ? Re-Admx 02/2014 w/ [place]>> s/p DES to the LCx ? Myoview 2/16: Low risk, inferior thinning versus infarct, no ischemia ? Cath 9/16: LCx, RCA stents ok; OM2 subbranch jailed, oPDA moderate stenosis>> med Rx  Chronic superior mesenteric artery dissection ? Followed by Dr. Trula Slade  Peripheral Arterial Disease   ? L CIA w penetrating ulcer  History of stroke in 2011  Diabetes mellitus  Hypertension  Chronic kidney disease  Hyperlipidemia ? Rosuvastatin; Alirocumab  Abdominal aortic aneurysm   Prior CV studies: Echo 10/27/15 Mod LVH, EF 55, inf-lat HK, Gr 1 DD, trivial AI, mild LAE  LHC 9/16 LAD: Ostial 30%, prox 30%, dist 30%, D1 30% LCx: prox 20%, OM2 stent ok with 10% ISR, small lateral OM2 80% RCA: Ostial stent ok with 15%, mid 20%, dist 20%, ostial RPDA 60% EF 55-65%  Myoview 2/16 Inferior lateral defect consistent with thinning vs small prior infarct; no ischemia. EF 49%  History of Present Illness:    Mr. Barry [name] was last seen in 10/2019.  His blood pressure was uncontrolled.  He was not sure about his medications.  I adjusted his lisinopril for blood pressure control.  We also made sure he was back on rosuvastatin and Alirocumab.  He returns for follow-up.  He is seen today with the assistance of an interpreter.  Since last seen, he has been doing well without chest discomfort, significant shortness of breath, orthopnea, leg swelling or syncope.  He notes that his blood sugar was elevated last night and he took an injection.  I believe he is  describing taking insulin.  He felt bloated after this.  Today he feels better.  He has not seen primary care or endocrinology in over a year.  He has also not seen vascular surgery since 2019.     Past Medical History:  Diagnosis Date  . CAD (coronary artery disease)    a. LHC (02/26/14):  mLAD 20 (faint L>R collats), mCFX 50, pRCA 95 (plaque rupture) >> PCI:  Xience DES to pRCA (severe tortuosity of R innominate artery >> needs L radial or FA in future);  b. LHC (03/10/14):  CFX 90, oOM 70, pRCA stent patent >> PCI:  Xience DES to mCFX   . Carotid stenosis    a. Carotid US (02/27/14):  Bilateral ICA 1-39%  . Depression   . Diabetes mellitus   . 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[place])    a. Mesenteric Artery Duplex (7/15):  pSMA chronic dissection with aneurysmal dilation of 1.29 cm ([facility]);  b.  Chest CTA (02/26/14):  IMPRESSION:  1. No aortic dissection or other acute abnormality.  2. 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  3. Stable 17 mm ectasia of the left common iliac artery.  4. Atherosclerosis, including aortoiliac and coronary artery disease.   Barry [name] History of echocardiogram 09/2015   Echo 1/17: mod LVH, EF 55%, inf-lat HK, Gr 1 DD, mild LAE  . Hx of cardiovascular stress test    Lexiscan Myoview (2/16):  Inferior lateral defect c/w thinning vs small prior infarct, no ischemia, EF 49%, Low Risk  . Hx of echocardiogram    a.  Echocardiogram (02/27/14):  Mod focal basal and mild concentric LVH, EF 50-55%, no RWMA, Gr 1 DD, mild TR, normal RVF  . Hyperlipidemia   . Hypertension   . Iliac artery aneurysm, left ([place])   . Myocardial infarction ([place])   . Stroke ([place]) 09/26/2009    Current Medications: Current Meds  Medication Sig  . Alirocumab (PRALUENT) 75 MG/ML SOAJ Inject 75 mg into the skin every 14 (fourteen) days.  Barry [name] aspirin EC 81 MG EC tablet Take 1 tablet (81 mg total) by mouth daily.  . blood glucose meter kit and supplies Dispense based on patient and insurance preference.  Use up to four times daily as directed. (FOR ICD-10 E10.9, E11.9).  Barry [name] gabapentin (NEURONTIN) 300 MG capsule Take 1 capsule (300 mg total) by mouth at bedtime.  Barry [name] glipiZIDE (GLUCOTROL) 5 MG tablet Take 1 tablet (5 mg total) by mouth daily before breakfast.  . glucose blood (ONE TOUCH ULTRA TEST) test strip 1 each by Other route daily. And lancets 1/day  . lisinopril (ZESTRIL) 20 MG tablet Take 1 tablet (20 mg total) by mouth daily.  . nitroGLYCERIN (NITROSTAT) 0.4 MG SL tablet Place 1 tablet (0.4 mg total) under the tongue every 5 (five) minutes as needed for chest pain.  Barry [name] ondansetron (ZOFRAN) 4 MG tablet Take 4 mg by mouth every 8 (eight) hours as needed for nausea or vomiting.  . rosuvastatin (CRESTOR) 40 MG tablet Take 1 tablet (40 mg total) by mouth daily.  . TECHLITE PEN NEEDLES 32G X 8 MM MISC USE ONE PEN NEEDLE TO CHECK BLOOD GLUCOSE ONCE DAILY     Allergies:   Metformin and related   Social History   Tobacco Use  . Smoking status: Former Smoker    Packs/day: 0.25    Years: 38.00    Pack years: 9.50    Types: Cigarettes    Quit date: 2008    Years since quitting: 13.2  . Smokeless tobacco: Never Used  Substance Use Topics  . Alcohol use: No    Alcohol/week: 0.0 standard drinks  . Drug use: No     Family Hx: The patient's family history includes Diabetes in his father; Heart attack in his father; Hypertension in his father. There is no history of Stroke.  ROS   EKGs/Labs/Other Test Reviewed:    EKG:  EKG is not ordered today.  The ekg ordered today demonstrates n/a  Recent Labs: 11/21/2019: ALT 18; BUN 16; Creatinine, Ser 1.38; Potassium 3.8; Sodium 139   Recent Lipid Panel Lab Results  Component Value Date/Time   CHOL 184 11/21/2019 09:27 AM   TRIG 157 (H) 11/21/2019 09:27 AM   HDL 39 (L) 11/21/2019 09:27 AM   CHOLHDL 4.7 11/21/2019 09:27 AM   CHOLHDL 3 03/19/2018 10:03 AM   LDLCALC 117 (H) 11/21/2019 09:27 AM   LDLDIRECT 101.0 04/20/2015 09:51 AM     Physical Exam:    VS:  BP (!) 150/80   Pulse 87   Ht 5' 5"  (1.651 m)   Wt 160 lb 6.4 oz (72.8 kg)   SpO2 96%   BMI 26.69 kg/m     Wt Readings from Last 3 Encounters:  12/13/19 160 lb 6.4 oz (72.8 kg)  10/29/19 162 lb 9.6 oz (73.8 kg)  08/20/18 159 lb 6.4 oz (72.3 kg)     Constitutional:  Appearance: Healthy appearance. Not in distress.  Neck:     Thyroid: Thyroid normal.     Vascular: JVD normal.  Pulmonary:     Effort: Pulmonary effort is normal.     Breath sounds: No wheezing. No rales.  Cardiovascular:     Normal rate. Regular rhythm. Normal S1. Normal S2.     Murmurs: There is no murmur.  Edema:    Peripheral edema absent.  Abdominal:     Palpations: Abdomen is soft. There is no hepatomegaly.  Skin:    General: Skin is warm and dry.  Neurological:     General: No focal deficit present.     Mental Status: Alert and oriented to person, place and time.     Cranial Nerves: Cranial nerves are intact.       ASSESSMENT & PLAN:    1. Essential hypertension Blood pressure remains above goal.  Goal is <130/80.  Continue current dose of lisinopril.  Start amlodipine 5 mg daily.  2. Mixed hyperlipidemia Continue high intensity statin with rosuvastatin 40 mg daily in addition to PCSK9 inhibitor with Alirocumab 75 mg every 14 days.  Follow-up fasting lipids and LFTs will be obtained in June 2021.  3. Coronary artery disease involving native coronary artery of native heart without angina pectoris S/p NSTEMI June 2015 treated with drug-eluting stent to the proximal RCA and subsequent drug-eluting stent to the LCx. Myoview in 2016 was low risk. Cardiac catheterization in 2016 demonstrated patent stents in the RCA and LCx. The second obtuse marginal was jailed by the stent and there was moderate stenosis in the ostial PDA. Residual disease has been managed medically.  He is doing well without anginal symptoms.  Continue rosuvastatin, Alirocumab, aspirin.  4. Stage 3a  chronic kidney disease Recent creatinine elevated.  Repeat BMET today to follow-up on chronic kidney disease.  5. PAD (peripheral artery disease) ([place]) I have asked him to contact the vascular surgeons office for a follow-up.  He is due for follow-up CT scan.  6. Type 2 diabetes mellitus with manifestations ([place]) I have also asked him to arrange follow-up with primary care.  It has been more than a year since his last appointment.   Dispo:  Return in about 6 months (around 06/14/2020) for Routine Follow Up, w/ Richardson Dopp, PA-C, in person.   Medication Adjustments/Labs and Tests Ordered: Current medicines are reviewed at length with the patient today.  Concerns regarding medicines are outlined above.  Tests Ordered: Orders Placed This Encounter  Procedures  . Basic metabolic panel   Medication Changes: Meds ordered this encounter  Medications  . amLODipine (NORVASC) 5 MG tablet    Sig: Take 1 tablet (5 mg total) by mouth daily.    Dispense:  30 tablet    Refill:  11    Order Specific Question:   Supervising Provider    Answer:   Lelon Perla [[id number]]    Signed, Richardson Dopp, PA-C  12/13/2019 12:42 PM    [facility] [address], [place], [place]  [address] Phone: [phone]; Fax: [phone]

## 2019-12-13 ENCOUNTER — Ambulatory Visit: Payer: Medicare Other | Admitting: Physician Assistant

## 2019-12-13 ENCOUNTER — Other Ambulatory Visit: Payer: Self-pay

## 2019-12-13 ENCOUNTER — Encounter: Payer: Self-pay | Admitting: Physician Assistant

## 2019-12-13 VITALS — BP 150/80 | HR 87 | Ht 65.0 in | Wt 160.4 lb

## 2019-12-13 DIAGNOSIS — E782 Mixed hyperlipidemia: Secondary | ICD-10-CM

## 2019-12-13 DIAGNOSIS — I739 Peripheral vascular disease, unspecified: Secondary | ICD-10-CM

## 2019-12-13 DIAGNOSIS — N1831 Chronic kidney disease, stage 3a: Secondary | ICD-10-CM

## 2019-12-13 DIAGNOSIS — I10 Essential (primary) hypertension: Secondary | ICD-10-CM

## 2019-12-13 DIAGNOSIS — I2510 Atherosclerotic heart disease of native coronary artery without angina pectoris: Secondary | ICD-10-CM

## 2019-12-13 DIAGNOSIS — E118 Type 2 diabetes mellitus with unspecified complications: Secondary | ICD-10-CM

## 2019-12-13 MED ORDER — AMLODIPINE BESYLATE 5 MG PO TABS: 5.0000 mg | ORAL_TABLET | Freq: Every day | ORAL | 11 refills | Status: DC

## 2019-12-13 NOTE — Patient Instructions (Signed)
Medication Instructions:   Your physician has recommended you make the following change in your medication:   1) Start Amlodipine 5 mg, 1 tablet by mouth once a day  *If you need a refill on your cardiac medications before your next appointment, please call your pharmacy*  Lab Work:  You will have labs drawn today: BMET  If you have labs (blood work) drawn today and your tests are completely normal, you will receive your results only by: Marland Kitchen MyChart Message (if you have MyChart) OR . A paper copy in the mail If you have any lab test that is abnormal or we need to change your treatment, we will call you to review the results.  Testing/Procedures:  None ordered today  Follow-Up: At [facility], you and your health needs are our priority.  As part of our continuing mission to provide you with exceptional heart care, we have created designated Provider Care Teams.  These Care Teams include your primary Cardiologist (physician) and Advanced Practice Providers (APPs -  Physician Assistants and Nurse Practitioners) who all work together to provide you with the care you need, when you need it.  We recommend signing up for the patient portal called "MyChart".  Sign up information is provided on this After Visit Summary.  MyChart is used to connect with patients for Virtual Visits (Telemedicine).  Patients are able to view lab/test results, encounter notes, upcoming appointments, etc.  Non-urgent messages can be sent to your provider as well.   To learn more about what you can do with MyChart, go to [email or web address].    Your next appointment:   6 month(s)  The format for your next appointment:   In Person  Provider:   Richardson Dopp, PA-C   Other Instructions  Call and schedule follow up with Dr. Trula Slade at [facility] and call Dr. Lorelei Pont your primary care doctor and schedule follow up with her as well.

## 2019-12-14 LAB — BASIC METABOLIC PANEL
BUN/Creatinine Ratio: 12 (ref 10–24)
BUN: 18 mg/dL (ref 8–27)
CO2: 28 mmol/L (ref 20–29)
Calcium: 9.6 mg/dL (ref 8.6–10.2)
Chloride: 96 mmol/L (ref 96–106)
Creatinine, Ser: 1.52 mg/dL — ABNORMAL HIGH (ref 0.76–1.27)
GFR calc Af Amer: 53 mL/min/{1.73_m2} — ABNORMAL LOW (ref >59)
GFR calc non Af Amer: 46 mL/min/{1.73_m2} — ABNORMAL LOW (ref >59)
Glucose: 366 mg/dL — ABNORMAL HIGH (ref 65–99)
Potassium: 4.2 mmol/L (ref 3.5–5.2)
Sodium: 141 mmol/L (ref 134–144)

## 2019-12-16 ENCOUNTER — Other Ambulatory Visit: Payer: Self-pay

## 2019-12-16 DIAGNOSIS — E118 Type 2 diabetes mellitus with unspecified complications: Secondary | ICD-10-CM

## 2019-12-16 MED ORDER — GLIPIZIDE 5 MG PO TABS: 5.0000 mg | ORAL_TABLET | Freq: Every day | ORAL | 2 refills | Status: DC

## 2019-12-18 ENCOUNTER — Telehealth: Payer: Self-pay | Admitting: Family Medicine

## 2019-12-18 NOTE — Telephone Encounter (Signed)
Per patient's Pros , patient is having facial numbness and lost of eye sight in one eye.    Please advise

## 2019-12-18 NOTE — Telephone Encounter (Signed)
Patient forwarded to triage Nurse.

## 2019-12-19 ENCOUNTER — Emergency Department (HOSPITAL_COMMUNITY): Payer: Medicare Other

## 2019-12-19 ENCOUNTER — Other Ambulatory Visit: Payer: Self-pay

## 2019-12-19 ENCOUNTER — Emergency Department (HOSPITAL_COMMUNITY)
Admission: EM | Admit: 2019-12-19 | Discharge: 2019-12-19 | Disposition: A | Discharge status: Home / Self Care | Payer: Medicare Other | Attending: Emergency Medicine | Admitting: Emergency Medicine

## 2019-12-19 ENCOUNTER — Encounter (HOSPITAL_COMMUNITY): Payer: Self-pay | Admitting: Emergency Medicine

## 2019-12-19 DIAGNOSIS — G510 Bell's palsy: Secondary | ICD-10-CM | POA: Insufficient documentation

## 2019-12-19 DIAGNOSIS — Z7982 Long term (current) use of aspirin: Secondary | ICD-10-CM | POA: Insufficient documentation

## 2019-12-19 DIAGNOSIS — E1165 Type 2 diabetes mellitus with hyperglycemia: Secondary | ICD-10-CM | POA: Insufficient documentation

## 2019-12-19 DIAGNOSIS — I10 Essential (primary) hypertension: Secondary | ICD-10-CM | POA: Diagnosis not present

## 2019-12-19 DIAGNOSIS — R29810 Facial weakness: Secondary | ICD-10-CM | POA: Diagnosis not present

## 2019-12-19 DIAGNOSIS — I2510 Atherosclerotic heart disease of native coronary artery without angina pectoris: Secondary | ICD-10-CM | POA: Insufficient documentation

## 2019-12-19 DIAGNOSIS — I5032 Chronic diastolic (congestive) heart failure: Secondary | ICD-10-CM | POA: Insufficient documentation

## 2019-12-19 DIAGNOSIS — Z79899 Other long term (current) drug therapy: Secondary | ICD-10-CM | POA: Diagnosis not present

## 2019-12-19 DIAGNOSIS — Z87891 Personal history of nicotine dependence: Secondary | ICD-10-CM | POA: Insufficient documentation

## 2019-12-19 DIAGNOSIS — I110 Hypertensive heart disease with heart failure: Secondary | ICD-10-CM | POA: Insufficient documentation

## 2019-12-19 DIAGNOSIS — R519 Headache, unspecified: Secondary | ICD-10-CM | POA: Insufficient documentation

## 2019-12-19 DIAGNOSIS — R739 Hyperglycemia, unspecified: Secondary | ICD-10-CM

## 2019-12-19 LAB — I-STAT CHEM 8, ED
BUN: 15 mg/dL (ref 8–23)
Calcium, Ion: 1.1 mmol/L — ABNORMAL LOW (ref 1.15–1.40)
Chloride: 100 mmol/L (ref 98–111)
Creatinine, Ser: 1.1 mg/dL (ref 0.61–1.24)
Glucose, Bld: 448 mg/dL — ABNORMAL HIGH (ref 70–99)
HCT: 44 % (ref 39.0–52.0)
Hemoglobin: 15 g/dL (ref 13.0–17.0)
Potassium: 3.6 mmol/L (ref 3.5–5.1)
Sodium: 137 mmol/L (ref 135–145)
TCO2: 26 mmol/L (ref 22–32)

## 2019-12-19 LAB — COMPREHENSIVE METABOLIC PANEL
ALT: 20 U/L (ref 0–44)
AST: 20 U/L (ref 15–41)
Albumin: 3.7 g/dL (ref 3.5–5.0)
Alkaline Phosphatase: 92 U/L (ref 38–126)
Anion gap: 10 (ref 5–15)
BUN: 12 mg/dL (ref 8–23)
CO2: 24 mmol/L (ref 22–32)
Calcium: 9 mg/dL (ref 8.9–10.3)
Chloride: 102 mmol/L (ref 98–111)
Creatinine, Ser: 1.23 mg/dL (ref 0.61–1.24)
GFR calc Af Amer: >60 mL/min (ref >60)
GFR calc non Af Amer: 60 mL/min — ABNORMAL LOW (ref >60)
Glucose, Bld: 441 mg/dL — ABNORMAL HIGH (ref 70–99)
Potassium: 3.8 mmol/L (ref 3.5–5.1)
Sodium: 136 mmol/L (ref 135–145)
Total Bilirubin: 1.2 mg/dL (ref 0.3–1.2)
Total Protein: 6.7 g/dL (ref 6.5–8.1)

## 2019-12-19 LAB — DIFFERENTIAL
Abs Immature Granulocytes: 0.03 10*3/uL (ref 0.00–0.07)
Basophils Absolute: 0 10*3/uL (ref 0.0–0.1)
Basophils Relative: 1 %
Eosinophils Absolute: 0.5 10*3/uL (ref 0.0–0.5)
Eosinophils Relative: 9 %
Immature Granulocytes: 1 %
Lymphocytes Relative: 21 %
Lymphs Abs: 1.3 10*3/uL (ref 0.7–4.0)
Monocytes Absolute: 0.4 10*3/uL (ref 0.1–1.0)
Monocytes Relative: 7 %
Neutro Abs: 3.8 10*3/uL (ref 1.7–7.7)
Neutrophils Relative %: 61 %

## 2019-12-19 LAB — PROTIME-INR
INR: 1 (ref 0.8–1.2)
Prothrombin Time: 13 seconds (ref 11.4–15.2)

## 2019-12-19 LAB — CBC
HCT: 45.4 % (ref 39.0–52.0)
Hemoglobin: 14.5 g/dL (ref 13.0–17.0)
MCH: 27.1 pg (ref 26.0–34.0)
MCHC: 31.9 g/dL (ref 30.0–36.0)
MCV: 84.7 fL (ref 80.0–100.0)
Platelets: 153 10*3/uL (ref 150–400)
RBC: 5.36 MIL/uL (ref 4.22–5.81)
RDW: 13.4 % (ref 11.5–15.5)
WBC: 6.1 10*3/uL (ref 4.0–10.5)
nRBC: 0 % (ref 0.0–0.2)

## 2019-12-19 LAB — APTT: aPTT: 29 seconds (ref 24–36)

## 2019-12-19 LAB — CBG MONITORING, ED: Glucose-Capillary: 283 mg/dL — ABNORMAL HIGH (ref 70–99)

## 2019-12-19 MED ORDER — VALACYCLOVIR HCL 1 G PO TABS: 1000.0000 mg | ORAL_TABLET | Freq: Three times a day (TID) | ORAL | 0 refills | Status: DC

## 2019-12-19 MED ORDER — SODIUM CHLORIDE 0.9% FLUSH: 3.0000 mL | Freq: Once | INTRAVENOUS | Status: DC

## 2019-12-19 NOTE — Discharge Instructions (Signed)
Use paper tape to tape eye shut when sleeping. Apply natural tears drops to left eye throughout the day. Take Valtrex as prescribed and complete the full course.  Follow-up with neurology for recheck, call today to schedule an appointment. Return to the ER for new or worsening symptoms.

## 2019-12-19 NOTE — ED Provider Notes (Signed)
[facility] EMERGENCY DEPARTMENT Provider Note   CSN: [id number] Arrival date & time: 12/19/19  2841     History Chief Complaint  Patient presents with  . stroke like symptoms    Barry [name] is a 69 y.o. male.  69 year old male presents with complaint of left-sided facial weakness for the past 3 days as well as a very mild left-sided headache.  Patient states that he cannot see very well out of his left eye, no difficulty walking.  Is having difficulty eating due to feeling like his jaw pulls to the right side and food falls of the left side of his mouth.  Past medical history of diabetes, coronary artery disease, hyperlipidemia, hypertension, prior MI and stroke (left arm weakness at baseline).        Past Medical History:  Diagnosis Date  . CAD (coronary artery disease)    a. LHC (02/26/14):  mLAD 20 (faint L>R collats), mCFX 50, pRCA 95 (plaque rupture) >> PCI:  Xience DES to pRCA (severe tortuosity of R innominate artery >> needs L radial or FA in future);  b. LHC (03/10/14):  CFX 90, oOM 70, pRCA stent patent >> PCI:  Xience DES to mCFX   . Carotid stenosis    a. Carotid US (02/27/14):  Bilateral ICA 1-39%  . Depression   . Diabetes mellitus   . 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[place])    a. Mesenteric Artery Duplex (7/15):  pSMA chronic dissection with aneurysmal dilation of 1.29 cm (VVS);  b.  Chest CTA (02/26/14):  IMPRESSION:  1. No aortic dissection or other acute abnormality.  2. 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  3. Stable 17 mm ectasia of the left common iliac artery.  4. Atherosclerosis, including aortoiliac and coronary artery disease.   Marland Kitchen History of echocardiogram 09/2015   Echo 1/17: mod LVH, EF 55%, inf-lat HK, Gr 1 DD, mild LAE  . Hx of cardiovascular stress test    Lexiscan Myoview (2/16):  Inferior lateral defect c/w thinning vs small prior infarct, no ischemia, EF 49%, Low Risk  . Hx of echocardiogram    a.  Echocardiogram (02/27/14):   Mod focal basal and mild concentric LVH, EF 50-55%, no RWMA, Gr 1 DD, mild TR, normal RVF  . Hyperlipidemia   . Hypertension   . Iliac artery aneurysm, left ([place])   . Myocardial infarction ([place])   . Stroke [facility]) 09/26/2009    Patient Active Problem List   Diagnosis Date Noted  . Atherosclerotic peripheral vascular disease with ulceration ([place]) 04/03/2015  . Coronary artery disease involving native coronary artery of native heart without angina pectoris 03/28/2014  . Aneurysm of iliac artery ([place]) 03/18/2014  . Abdominal pain, unspecified site 03/18/2014  . Syncope in setting of nausea and vomiting 03/07/14 03/08/2014  . Non compliance with medical treatment 02/28/2014  . Dyslipidemia, goal LDL below 70 02/28/2014  . Bradycardia- beta blocker decreased 02/28/2014  . Chronic diastolic CHF (congestive heart failure) ([place]) 02/28/2014  . Type 2 diabetes mellitus with manifestations ([place]) 02/27/2014  . TIA (transient ischemic attack) 02/26/2014  . History of non-ST elevation myocardial infarction (NSTEMI) 02/26/2014  . Weakness-Left arm / [place] 12/06/2013  . Mesenteric artery stenosis ([place]) 12/06/2013  . 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[place]) 12/03/2012  . Hypertension 11/10/2011  . History of CVA in adulthood 11/10/2011  . Language barrier 11/10/2011    Past Surgical History:  Procedure Laterality Date  . CARDIAC CATHETERIZATION    . CARDIAC CATHETERIZATION N/A  06/25/2015   Procedure: Left Heart Cath and Coronary Angiography;  Surgeon: Burnell Blanks, MD;  Location: [facility] CV LAB;  Service: Cardiovascular;  Laterality: N/A;  . COLONOSCOPY WITH PROPOFOL N/A 07/23/2015   Procedure: COLONOSCOPY WITH PROPOFOL;  Surgeon: [name], MD;  Location: [facility] ENDOSCOPY;  Service: Endoscopy;  Laterality: N/A;  . CORONARY ANGIOPLASTY    . LEFT HEART CATHETERIZATION WITH CORONARY ANGIOGRAM N/A 02/26/2014   Procedure: LEFT HEART CATHETERIZATION WITH CORONARY ANGIOGRAM;  Surgeon: Wellington Hampshire, MD;  Location: [facility] CATH LAB;  Service: Cardiovascular;  Laterality: N/A;  . LEFT HEART CATHETERIZATION WITH CORONARY ANGIOGRAM N/A 03/10/2014   Procedure: LEFT HEART CATHETERIZATION WITH CORONARY ANGIOGRAM;  Surgeon: Jettie Booze, MD;  Location: [facility] CATH LAB;  Service: Cardiovascular;  Laterality: N/A;       Family History  Problem Relation Age of Onset  . Diabetes Father   . Hypertension Father   . Heart attack Father   . Stroke Neg Hx     Social History   Tobacco Use  . Smoking status: Former Smoker    Packs/day: 0.25    Years: 38.00    Pack years: 9.50    Types: Cigarettes    Quit date: 2008    Years since quitting: 13.2  . Smokeless tobacco: Never Used  Substance Use Topics  . Alcohol use: No    Alcohol/week: 0.0 standard drinks  . Drug use: No    Home Medications Prior to Admission medications   Medication Sig Start Date End Date Taking? Authorizing Provider  Alirocumab (PRALUENT) 75 MG/ML SOAJ Inject 75 mg into the skin every 14 (fourteen) days. 10/03/19   End, Harrell Gave, MD  amLODipine (NORVASC) 5 MG tablet Take 1 tablet (5 mg total) by mouth daily. 12/13/19 12/12/20  Richardson Dopp T, PA-C  aspirin EC 81 MG EC tablet Take 1 tablet (81 mg total) by mouth daily. 05/05/17   Geradine Girt, DO  blood glucose meter kit and supplies Dispense based on patient and insurance preference. Use up to four times daily as directed. (FOR ICD-10 E10.9, E11.9). 05/10/19   Copland, Gay Filler, MD  gabapentin (NEURONTIN) 300 MG capsule Take 1 capsule (300 mg total) by mouth at bedtime. 11/15/17   Copland, Gay Filler, MD  glipiZIDE (GLUCOTROL) 5 MG tablet Take 1 tablet (5 mg total) by mouth daily before breakfast. 12/16/19   Philemon Kingdom, MD  glucose blood (ONE TOUCH ULTRA TEST) test strip 1 each by Other route daily. And lancets 1/day 08/20/18   Renato Shin, MD  lisinopril (ZESTRIL) 20 MG tablet Take 1 tablet (20 mg total) by mouth daily. 10/29/19 10/28/20  Richardson Dopp T, PA-C   nitroGLYCERIN (NITROSTAT) 0.4 MG SL tablet Place 1 tablet (0.4 mg total) under the tongue every 5 (five) minutes as needed for chest pain. 10/18/16   Wendie Agreste, MD  ondansetron (ZOFRAN) 4 MG tablet Take 4 mg by mouth every 8 (eight) hours as needed for nausea or vomiting.    [provider]  rosuvastatin (CRESTOR) 40 MG tablet Take 1 tablet (40 mg total) by mouth daily. 11/28/19 02/26/20  Weaver, Scott T, PA-C  TECHLITE PEN NEEDLES 32G X 8 MM MISC USE ONE PEN NEEDLE TO CHECK BLOOD GLUCOSE ONCE DAILY 08/31/18   Copland, Gay Filler, MD  valACYclovir (VALTREX) 1000 MG tablet Take 1 tablet (1,000 mg total) by mouth 3 (three) times daily. 12/19/19   Tacy Learn, PA-C    Allergies  Metformin and related  Review of Systems   Review of Systems  Constitutional: Negative for fever.  Eyes: Positive for visual disturbance.  Respiratory: Negative for shortness of breath.   Cardiovascular: Negative for chest pain.  Gastrointestinal: Negative for abdominal pain.  Skin: Negative for rash and wound.  Allergic/Immunologic: Positive for immunocompromised state.  Neurological: Positive for facial asymmetry and headaches. Negative for dizziness, speech difficulty, weakness and numbness.  Hematological: Does not bruise/bleed easily.    Physical Exam Updated Vital Signs BP (!) 152/94   Pulse 63   Temp 98.6 F (37 C)   Resp 16   Ht 5' 5"  (1.651 m)   Wt 72.6 kg   SpO2 97%   BMI 26.63 kg/m   Physical Exam Vitals and nursing note reviewed.  Constitutional:      General: He is not in acute distress.    Appearance: He is well-developed. He is not diaphoretic.  HENT:     Head: Normocephalic and atraumatic.  Eyes:     Extraocular Movements: Extraocular movements intact.     Pupils: Pupils are equal, round, and reactive to light.  Cardiovascular:     Rate and Rhythm: Normal rate and regular rhythm.     Pulses: Normal pulses.     Heart sounds: Normal heart sounds.  Pulmonary:      Effort: Pulmonary effort is normal.     Breath sounds: Normal breath sounds.  Abdominal:     Palpations: Abdomen is soft.     Tenderness: There is no abdominal tenderness.  Musculoskeletal:     Right lower leg: No edema.     Left lower leg: No edema.  Skin:    General: Skin is warm and dry.     Findings: No erythema or rash.  Neurological:     Mental Status: He is alert and oriented to person, place, and time.     Sensory: No sensory deficit.     Motor: Weakness present. No pronator drift.     Comments: Flattening of left forehead wrinkles, asymetry left side face- unable to raise left eye brow, fully close left eye, left smile asymetry. Sensation intact face, extremities.  Mild left grip weakness (chronic).  Psychiatric:        Behavior: Behavior normal.     ED Results / Procedures / Treatments   Labs (all labs ordered are listed, but only abnormal results are displayed) Labs Reviewed  COMPREHENSIVE METABOLIC PANEL - Abnormal; Notable for the following components:      Result Value   Glucose, Bld 441 (*)    GFR calc non Af Amer 60 (*)    All other components within normal limits  I-STAT CHEM 8, ED - Abnormal; Notable for the following components:   Glucose, Bld 448 (*)    Calcium, Ion 1.10 (*)    All other components within normal limits  CBG MONITORING, ED - Abnormal; Notable for the following components:   Glucose-Capillary 283 (*)    All other components within normal limits  PROTIME-INR  APTT  CBC  DIFFERENTIAL  CBG MONITORING, ED    EKG EKG Interpretation  Date/Time:  Thursday December 19 2019 08:30:47 EDT Ventricular Rate:  62 PR Interval:  168 QRS Duration: 112 QT Interval:  464 QTC Calculation: 470 R Axis:   69 Text Interpretation: Normal sinus rhythm T wave abnormality, consider inferior ischemia Abnormal ECG No STEMI, no significant change from prior ecg Confirmed by Octaviano Glow [phone]) on 12/19/2019 1:24:22 PM   Radiology  CT HEAD WO  CONTRAST  Result Date: 12/19/2019 CLINICAL DATA:  Left facial droop started 3 days ago. Left-sided weakness. EXAM: CT HEAD WITHOUT CONTRAST TECHNIQUE: Contiguous axial images were obtained from the base of the skull through the vertex without intravenous contrast. COMPARISON:  January 12, 2015 FINDINGS: Brain: No evidence of acute infarction, hemorrhage, hydrocephalus, extra-axial collection or mass lesion/mass effect. Vascular: No hyperdense vessel noted. Skull: Normal. Negative for fracture or focal lesion. Sinuses/Orbits: No acute finding. Other: None IMPRESSION: No focal acute intracranial abnormality identified. Electronically Signed   By: Abelardo Diesel M.D.   On: 12/19/2019 08:47   MR BRAIN WO CONTRAST  Result Date: 12/19/2019 CLINICAL DATA:  Focal neuro deficit. Left facial droop, headache, and left grip weakness. EXAM: MRI HEAD WITHOUT CONTRAST TECHNIQUE: Multiplanar, multiecho pulse sequences of the brain and surrounding structures were obtained without intravenous contrast. COMPARISON:  Head CT 12/19/2019 and MRI 01/12/2015 FINDINGS: Brain: There is no evidence of acute infarct, intracranial hemorrhage, mass, midline shift, or extra-axial fluid collection. The ventricles and sulci are within normal limits for age. Scattered T2 hyperintensities in the cerebral white matter bilaterally, most notable in the periatrial regions, have mildly progressed from the prior MRI and are nonspecific but compatible with mild chronic small vessel ischemic disease. There are chronic lacunar infarcts in the caudate nuclei. Vascular: Major intracranial vascular flow voids are preserved. Skull and upper cervical spine: Unremarkable bone marrow signal. Sinuses/Orbits: Unremarkable orbits. Paranasal sinuses and mastoid air cells are clear. Other: None. IMPRESSION: 1. No acute intracranial abnormality. 2. Mild chronic small vessel ischemic disease. Electronically Signed   By: Logan Bores M.D.   On: 12/19/2019 12:30     Procedures Procedures (including critical care time)  Medications Ordered in ED Medications  sodium chloride flush (NS) 0.9 % injection 3 mL (has no administration in time range)    ED Course  I have reviewed the triage vital signs and the nursing notes.  Pertinent labs & imaging results that were available during my care of the patient were reviewed by me and considered in my medical decision making (see chart for details).  Clinical Course as of Dec 19 1354  Thu Dec 18, 6152  4362 69 year old male with prior stroke presents with complaint of left-sided facial weakness x3 days.  On exam has left side asymmetry with flattening of the forehead, unable to raise eyebrows, unable to fully close left eye, smile asymmetrical.  Baseline left grip strength weakness, symmetric leg strength. CT head and MRI ordered in triage, negative for acute stroke. Suspect Bell's palsy.  Due to history of diabetes with elevated blood sugars, will hold off on treating with prednisone, will treat with Valtrex.  Recommend tape eye shut at night to sleep, use moisturizing drops throughout the day and follow-up with neurology.  Was seen by Dr. Festus Barren, ER attending, agrees with plan of care.   [LM]  1334 Review of labs, patient's blood sugar is 441, will recheck prior to discharge, labs otherwise unremarkable including PT PTT, CBC.  Rest of chemistry reassuring, not in DKA today. Plan will be for patient to hydrate at home, follow his blood sugar and take his medications, recheck with PCP if blood sugars not improving.   [LM]  1356 CBG has improved to 283, patient ready for discharge.   [LM]    Clinical Course User Index [LM] Roque Lias   MDM Rules/Calculators/A&P  Final Clinical Impression(s) / ED Diagnoses Final diagnoses:  Bell's palsy  Hyperglycemia    Rx / DC Orders ED Discharge Orders         Ordered    valACYclovir (VALTREX) 1000 MG tablet  3 times daily      12/19/19 1332           Roque Lias 12/19/19 1356    Wyvonnia Dusky, MD 12/19/19 [phone]

## 2019-12-19 NOTE — ED Triage Notes (Signed)
Started 3 days ago with left sided facial droop, left grip is slightly weaker than right-- obvious facial droop. Headache on left side also.

## 2019-12-27 ENCOUNTER — Telehealth: Payer: Self-pay

## 2019-12-27 DIAGNOSIS — N1831 Chronic kidney disease, stage 3a: Secondary | ICD-10-CM

## 2019-12-27 DIAGNOSIS — I10 Essential (primary) hypertension: Secondary | ICD-10-CM

## 2019-12-27 MED ORDER — LISINOPRIL 20 MG PO TABS: 10.0000 mg | ORAL_TABLET | Freq: Every day | ORAL | 11 refills | Status: DC

## 2019-12-27 NOTE — Telephone Encounter (Signed)
I called and spoke with patients son Pros, he is aware of lab results and to have patient hold Lisinopril for 3 days and then resume at 10 mg (0.5 tablet) once a day. Patient will come in on 01/06/20 for BMET. Appointment with hypertension clinic scheduled for 01/27/20.

## 2019-12-27 NOTE — Telephone Encounter (Signed)
-----   Message from Liliane Shi, PA-C sent at 12/16/2019  7:31 AM EDT ----- K+ normal.  Glucose high.  Creatinine increased more. PLAN:   - Hold Lisinopril for 3 days, then resume at decreased dose of 10 mg once daily   - BMET 7-10 days  - Follow up with PCP for diabetes - send copy of labs to PCP  - Arrange HTN clinic appt in 1 month Richardson Dopp, PA-C    12/16/2019 7:29 AM

## 2020-01-02 ENCOUNTER — Ambulatory Visit: Payer: Medicare Other | Admitting: Family Medicine

## 2020-01-03 ENCOUNTER — Other Ambulatory Visit: Payer: Self-pay

## 2020-01-05 NOTE — Progress Notes (Addendum)
[place] [facility] [address] [place], [place] [address] [phone]  Date:  01/06/2020   Name:  Barry [name]   DOB:  1951/07/19   MRN:  [id number]  PCP:  Darreld Mclean, MD    Chief Complaint: Hospitalization Follow-up   History of Present Illness:  Barry [name] is a 69 y.o. very pleasant male patient who presents with the following:  Patient here today for follow-up visit Seen with the aid of interpreter today.  He is also accompanied by his wife Last seen by myself in September 2019-he did not follow-up with me at that time as instructed History of CAD status post NSTEMI and stenting x2, chronic superior mesenteric artery dissection followed by vascular surgery, PAD, stroke in 2011, diabetes, hypertension, hyperlipidemia, CKD  He was seen in the ER last month with left-sided facial weakness, diagnosed with Bell's palsy Today he notes that he is able to close his eye and his Bell's palsy is improved though not completely resolved  He saw cardiology for follow-up in March Last seen by endocrinology November 2019  Lab Results  Component Value Date   HGBA1C 7.4 (A) 08/20/2018   Needs eye exam Overdue for A1c  He brought in just some of his medications today- it is somewhat unclear what he is taking  He brings in some blood sugar readings today - it appear that they are running quite high, in the 300s- 400s They report he thought his crestor was for diabetes We are not sure if he is taking glipizide or not today -they are not sure what if any diabetes medications he is currently taking Eye exam was "last year" His main concern today is that his bilateral shoulders ache, especially when he tries to lift things or lays down at night.  He is not aware of any particular injury  Patient Active Problem List   Diagnosis Date Noted  . Atherosclerotic peripheral vascular disease with ulceration ([place]) 04/03/2015  . Coronary  artery disease involving native coronary artery of native heart without angina pectoris 03/28/2014  . Aneurysm of iliac artery ([place]) 03/18/2014  . Abdominal pain, unspecified site 03/18/2014  . Syncope in setting of nausea and vomiting 03/07/14 03/08/2014  . Non compliance with medical treatment 02/28/2014  . Dyslipidemia, goal LDL below 70 02/28/2014  . Bradycardia- beta blocker decreased 02/28/2014  . Chronic diastolic CHF (congestive heart failure) ([place]) 02/28/2014  . Type 2 diabetes mellitus with manifestations ([place]) 02/27/2014  . TIA (transient ischemic attack) 02/26/2014  . History of non-ST elevation myocardial infarction (NSTEMI) 02/26/2014  . Weakness-Left arm / Lef 12/06/2013  . Mesenteric artery stenosis ([place]) 12/06/2013  . 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[place]) 12/03/2012  . Hypertension 11/10/2011  . History of CVA in adulthood 11/10/2011  . Language barrier 11/10/2011    Past Medical History:  Diagnosis Date  . CAD (coronary artery disease)    a. LHC (02/26/14):  mLAD 20 (faint L>R collats), mCFX 50, pRCA 95 (plaque rupture) >> PCI:  Xience DES to pRCA (severe tortuosity of R innominate artery >> needs L radial or FA in future);  b. LHC (03/10/14):  CFX 90, oOM 70, pRCA stent patent >> PCI:  Xience DES to mCFX   . Carotid stenosis    a. Carotid US (02/27/14):  Bilateral ICA 1-39%  . Depression   . Diabetes mellitus   . 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[place])    a. Mesenteric Artery Duplex (  7/15):  pSMA chronic dissection with aneurysmal dilation of 1.29 cm (VVS);  b.  Chest CTA (02/26/14):  IMPRESSION:  1. No aortic dissection or other acute abnormality.  2. 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  3. Stable 17 mm ectasia of the left common iliac artery.  4. Atherosclerosis, including aortoiliac and coronary artery disease.   Marland Kitchen History of echocardiogram 09/2015   Echo 1/17: mod LVH, EF 55%, inf-lat HK, Gr 1 DD, mild LAE  . Hx of cardiovascular stress test    Lexiscan  Myoview (2/16):  Inferior lateral defect c/w thinning vs small prior infarct, no ischemia, EF 49%, Low Risk  . Hx of echocardiogram    a.  Echocardiogram (02/27/14):  Mod focal basal and mild concentric LVH, EF 50-55%, no RWMA, Gr 1 DD, mild TR, normal RVF  . Hyperlipidemia   . Hypertension   . Iliac artery aneurysm, left ([place])   . Myocardial infarction ([place])   . Stroke [facility]) 09/26/2009    Past Surgical History:  Procedure Laterality Date  . CARDIAC CATHETERIZATION    . CARDIAC CATHETERIZATION N/A 06/25/2015   Procedure: Left Heart Cath and Coronary Angiography;  Surgeon: Burnell Blanks, MD;  Location: [place] CV LAB;  Service: Cardiovascular;  Laterality: N/A;  . COLONOSCOPY WITH PROPOFOL N/A 07/23/2015   Procedure: COLONOSCOPY WITH PROPOFOL;  Surgeon: [name], MD;  Location: [facility] ENDOSCOPY;  Service: Endoscopy;  Laterality: N/A;  . CORONARY ANGIOPLASTY    . LEFT HEART CATHETERIZATION WITH CORONARY ANGIOGRAM N/A 02/26/2014   Procedure: LEFT HEART CATHETERIZATION WITH CORONARY ANGIOGRAM;  Surgeon: Wellington Hampshire, MD;  Location: [place] CATH LAB;  Service: Cardiovascular;  Laterality: N/A;  . LEFT HEART CATHETERIZATION WITH CORONARY ANGIOGRAM N/A 03/10/2014   Procedure: LEFT HEART CATHETERIZATION WITH CORONARY ANGIOGRAM;  Surgeon: Jettie Booze, MD;  Location: [facility] CATH LAB;  Service: Cardiovascular;  Laterality: N/A;    Social History   Tobacco Use  . Smoking status: Former Smoker    Packs/day: 0.25    Years: 38.00    Pack years: 9.50    Types: Cigarettes    Quit date: 2008    Years since quitting: 13.2  . Smokeless tobacco: Never Used  Substance Use Topics  . Alcohol use: No    Alcohol/week: 0.0 standard drinks  . Drug use: No    Family History  Problem Relation Age of Onset  . Diabetes Father   . Hypertension Father   . Heart attack Father   . Stroke Neg Hx     Allergies  Allergen Reactions  . Metformin And Related     Pt feels that this causes  constipation and will not take     Medication list has been reviewed and updated.  Current Outpatient Medications on File Prior to Visit  Medication Sig Dispense Refill  . Alirocumab (PRALUENT) 75 MG/ML SOAJ Inject 75 mg into the skin every 14 (fourteen) days. 2 pen 11  . aspirin EC 81 MG EC tablet Take 1 tablet (81 mg total) by mouth daily.    . blood glucose meter kit and supplies Dispense based on patient and insurance preference. Use up to four times daily as directed. (FOR ICD-10 E10.9, E11.9). 1 each 0  . glipiZIDE (GLUCOTROL) 5 MG tablet Take 1 tablet (5 mg total) by mouth daily before breakfast. 30 tablet 2  . glucose blood (ONE TOUCH ULTRA TEST) test strip 1 each by Other route daily. And lancets 1/day 100 each 3  .  lisinopril (ZESTRIL) 20 MG tablet Take 0.5 tablets (10 mg total) by mouth daily. 15 tablet 11  . rosuvastatin (CRESTOR) 40 MG tablet Take 1 tablet (40 mg total) by mouth daily. 90 tablet 3  . TECHLITE PEN NEEDLES 32G X 8 MM MISC USE ONE PEN NEEDLE TO CHECK BLOOD GLUCOSE ONCE DAILY 100 each 3  . valACYclovir (VALTREX) 1000 MG tablet Take 1 tablet (1,000 mg total) by mouth 3 (three) times daily. 21 tablet 0  . amLODipine (NORVASC) 5 MG tablet Take 1 tablet (5 mg total) by mouth daily. (Patient not taking: Reported on 01/06/2020) 30 tablet 11  . gabapentin (NEURONTIN) 300 MG capsule Take 1 capsule (300 mg total) by mouth at bedtime. (Patient not taking: Reported on 01/06/2020) 90 capsule 3   No current facility-administered medications on file prior to visit.    Review of Systems:  As per HPI- otherwise negative.   Physical Examination: Vitals:   01/06/20 1025  BP: (!) 147/80  Pulse: 60  Resp: 17  Temp: 98 F (36.7 C)  SpO2: 98%   Vitals:   01/06/20 1025  Weight: 155 lb (70.3 kg)  Height: 5' 5"  (1.651 m)   Body mass index is 25.79 kg/m. Ideal Body Weight: Weight in (lb) to have BMI = 25: 149.9  GEN: no acute distress.  Normal weight, looks well   Accompanied today by his wife and interpreter HEENT: Atraumatic, Normocephalic.  Ears and Nose: No external deformity. CV: RRR, No M/G/R. No JVD. No thrill. No extra heart sounds. PULM: CTA B, no wheezes, crackles, rhonchi. No retractions. No resp. distress. No accessory muscle use. ABD: S, NT, ND EXTR: No c/c/e PSYCH: Normally interactive. Conversant.  Foot exam; normal  Recent Bell's palsy  He has decreased motion of the left forehead, smile is not quite even He has discomfort with range of motion of both shoulders, especially with external rotation and Neer's test  Assessment and Plan: Type 2 diabetes mellitus with complication, without long-term current use of insulin (HCC) - Plan: Hemoglobin A1c, glucose blood (ONE TOUCH ULTRA TEST) test strip  Essential hypertension  Dyslipidemia  Chronic pain of both shoulders - Plan: Ambulatory referral to Orthopedic Surgery  Language barrier  H/O noncompliance with medical treatment, presenting hazards to health  Bell's palsy  Here today for follow-up visit History of diabetes, he has not followed up in some time and we are not sure what medication he is taking.  Will obtain A1c today and go from there Blood pressures under reasonable control We think he is taking his lipid medication Referral to orthopedics to discuss his shoulder problem Bell's palsy is improving, he is able to close his eyes okay Moderate medical decision making  This visit occurred during the SARS-CoV-2 public health emergency.  Safety protocols were in place, including screening questions prior to the visit, additional usage of staff PPE, and extensive cleaning of exam room while observing appropriate contact time as indicated for disinfecting solutions.   Signed Lamar Blinks, MD  Received his labs 4/13  Results for orders placed or performed in visit on 01/06/20  Hemoglobin A1c  Result Value Ref Range   Hgb A1c MFr Bld 12.1 (H) 4.6 - 6.5 %   Also a  message from his son asking for a call Called his son back- we think pt is is currently taking  Glipizide Metformin Amlodipine crestor He may also be using Praluent injections I am not sure how faithfully he is taking his medications  Advised  his son that A1c is too high, I will reach out to endocrinology to see if they can get him in for follow-up.

## 2020-01-06 ENCOUNTER — Encounter: Payer: Self-pay | Admitting: Family Medicine

## 2020-01-06 ENCOUNTER — Ambulatory Visit (INDEPENDENT_AMBULATORY_CARE_PROVIDER_SITE_OTHER): Payer: Medicare Other | Admitting: Family Medicine

## 2020-01-06 ENCOUNTER — Other Ambulatory Visit: Payer: Medicare Other

## 2020-01-06 ENCOUNTER — Other Ambulatory Visit: Payer: Self-pay

## 2020-01-06 VITALS — BP 147/80 | HR 60 | Temp 98.0°F | Resp 17 | Ht 65.0 in | Wt 155.0 lb

## 2020-01-06 DIAGNOSIS — M25512 Pain in left shoulder: Secondary | ICD-10-CM

## 2020-01-06 DIAGNOSIS — Z789 Other specified health status: Secondary | ICD-10-CM

## 2020-01-06 DIAGNOSIS — E118 Type 2 diabetes mellitus with unspecified complications: Secondary | ICD-10-CM | POA: Diagnosis not present

## 2020-01-06 DIAGNOSIS — I10 Essential (primary) hypertension: Secondary | ICD-10-CM

## 2020-01-06 DIAGNOSIS — M25511 Pain in right shoulder: Secondary | ICD-10-CM | POA: Diagnosis not present

## 2020-01-06 DIAGNOSIS — E785 Hyperlipidemia, unspecified: Secondary | ICD-10-CM

## 2020-01-06 DIAGNOSIS — Z91199 Patient's noncompliance with other medical treatment and regimen due to unspecified reason: Secondary | ICD-10-CM

## 2020-01-06 DIAGNOSIS — Z9119 Patient's noncompliance with other medical treatment and regimen: Secondary | ICD-10-CM

## 2020-01-06 DIAGNOSIS — G8929 Other chronic pain: Secondary | ICD-10-CM

## 2020-01-06 DIAGNOSIS — G510 Bell's palsy: Secondary | ICD-10-CM

## 2020-01-06 LAB — HEMOGLOBIN A1C: Hgb A1c MFr Bld: 12.1 % — ABNORMAL HIGH (ref 4.6–6.5)

## 2020-01-06 MED ORDER — GLUCOSE BLOOD VI STRP: 1.0000 | ORAL_STRIP | Freq: Every day | 3 refills | Status: DC

## 2020-01-06 NOTE — Patient Instructions (Signed)
It was good to see you today I will be in touch with your A1c- this will help [place] determine if your diabetes is ok Please be sure to get an eye exam annually Please bring ALL your medications to your appointments, it is difficult for me to adjust your diabetes medications when I do not know what you are taking currently

## 2020-01-07 ENCOUNTER — Telehealth: Payer: Self-pay

## 2020-01-07 ENCOUNTER — Encounter: Payer: Self-pay | Admitting: Family Medicine

## 2020-01-07 ENCOUNTER — Other Ambulatory Visit: Payer: Medicare Other

## 2020-01-07 NOTE — Telephone Encounter (Signed)
Nurse Assessment Nurse: Nicki Reaper, RN, Malachy Mood Date/Time (Eastern Time): 01/06/2020 9:10:05 PM Confirm and document reason for call. If symptomatic, describe symptoms. ---Caller states his father is taking Amlodipine 10mg  daily and he has some questions, Lisinopril 20mg  daily, is wanting to know if patient take medications together, instructed to pharmacy. Has the patient had close contact with a person known or suspected to have the novel coronavirus illness OR traveled / lives in area with major community spread (including international travel) in the last 14 days from the onset of symptoms? * If Asymptomatic, screen for exposure and travel within the last 14 days. ---No Does the patient have any new or worsening symptoms? ---No Guidelines Guideline Title Affirmed Question Affirmed Notes Nurse Date/Time (Eastern Time) Disp. Time Eilene Ghazi Time) Disposition Final User 01/06/2020 8:46:25 PM Send To Nurse Wallie Char, RN, Kaila 01/06/2020 9:19:12 PM Clinical Call Yes Nicki Reaper, RN, Luretha Rued

## 2020-01-08 ENCOUNTER — Telehealth: Payer: Self-pay

## 2020-01-08 NOTE — Telephone Encounter (Signed)
LAB RESULTS  Lab results were reviewed by Dr. Loanne Drilling. A letter has been mailed to pt home address also advising him that an appt is needed to discuss results. For future reference, letter can be found in [place].

## 2020-01-08 NOTE — Telephone Encounter (Signed)
-----   Message from Renato Shin, MD sent at 01/08/2020 11:51 AM EDT ----- please contact patient: F/u is due.

## 2020-01-10 ENCOUNTER — Other Ambulatory Visit: Payer: Medicare Other | Admitting: *Deleted

## 2020-01-10 ENCOUNTER — Other Ambulatory Visit: Payer: Self-pay

## 2020-01-10 DIAGNOSIS — N1831 Chronic kidney disease, stage 3a: Secondary | ICD-10-CM

## 2020-01-10 DIAGNOSIS — I10 Essential (primary) hypertension: Secondary | ICD-10-CM | POA: Diagnosis not present

## 2020-01-10 LAB — BASIC METABOLIC PANEL
BUN/Creatinine Ratio: 18 (ref 10–24)
BUN: 24 mg/dL (ref 8–27)
CO2: 22 mmol/L (ref 20–29)
Calcium: 9 mg/dL (ref 8.6–10.2)
Chloride: 99 mmol/L (ref 96–106)
Creatinine, Ser: 1.32 mg/dL — ABNORMAL HIGH (ref 0.76–1.27)
GFR calc Af Amer: 63 mL/min/{1.73_m2} (ref >59)
GFR calc non Af Amer: 55 mL/min/{1.73_m2} — ABNORMAL LOW (ref >59)
Glucose: 370 mg/dL — ABNORMAL HIGH (ref 65–99)
Potassium: 4.3 mmol/L (ref 3.5–5.2)
Sodium: 136 mmol/L (ref 134–144)

## 2020-01-16 ENCOUNTER — Ambulatory Visit: Payer: Self-pay

## 2020-01-16 ENCOUNTER — Encounter: Payer: Self-pay | Admitting: Orthopedic Surgery

## 2020-01-16 ENCOUNTER — Ambulatory Visit: Payer: Medicare Other | Admitting: Orthopedic Surgery

## 2020-01-16 ENCOUNTER — Other Ambulatory Visit: Payer: Self-pay

## 2020-01-16 DIAGNOSIS — M545 Low back pain, unspecified: Secondary | ICD-10-CM

## 2020-01-16 DIAGNOSIS — M25512 Pain in left shoulder: Secondary | ICD-10-CM

## 2020-01-16 DIAGNOSIS — M24811 Other specific joint derangements of right shoulder, not elsewhere classified: Secondary | ICD-10-CM

## 2020-01-16 DIAGNOSIS — M25511 Pain in right shoulder: Secondary | ICD-10-CM

## 2020-01-17 ENCOUNTER — Encounter: Payer: Self-pay | Admitting: Orthopedic Surgery

## 2020-01-17 NOTE — Progress Notes (Signed)
Office Visit Note   Patient: Barry [name]           Date of Birth: 1951/01/02           MRN: [id number] Visit Date: 01/16/2020 Requested by: Darreld Mclean, MD [address],  [place] [address] PCP: Darreld Mclean, MD  Subjective: Chief Complaint  Patient presents with  . Right Shoulder - Pain  . Left Shoulder - Pain    HPI: Barry [name] is a 69 y.o. male who presents to the office complaining of bilateral shoulder pain and low back pain.  Patient notes 6 weeks of bilateral shoulder pain without injury.  Right shoulder significantly more symptomatic than the left.  He is right-hand dominant.  Localizes pain to the lateral aspect of the shoulders with radiation down to the elbow.  No radiation past the elbow.  Occasional numbness and tingling in all 10 fingers.  Mild neck pain is present.  He does feel weak in both of his arms.  Pain wakes him up at night from his shoulder.  He has never had any surgery on his neck or shoulder. HEP provides no relief from pain.  Patient also complains of low back pain.  Both sides of his back are affected but the right side bothers him more.  Pain is been ongoing for the last year.  He has radicular pain that travels down the posterior lateral thigh into his toes at times.  Associated numbness and tingling is present.  He does note subjective weakness of the bilateral legs.  Denies any red flag symptoms such as bowel/bladder incontinence or saddle anesthesia.  He takes Tylenol for his pain without significant relief.  Patient notes a history of cardiac stent.  He also has a history of diabetes with his last A1c measured at 12.1.  He stopped smoking 2008.                ROS:  All systems reviewed are negative as they relate to the chief complaint within the history of present illness.  Patient denies fevers or chills.  Assessment & Plan: Visit Diagnoses:  1. Crepitus of right shoulder joint   2. Left shoulder pain, unspecified  chronicity   3. Right shoulder pain, unspecified chronicity   4. Low back pain, unspecified back pain laterality, unspecified chronicity, unspecified whether sciatica present     Plan: Patient is a 69 year old male who presents complaining of bilateral shoulder pain and low back pain.  X-rays are negative for any significant findings to explain all of patient's pain.  He does have some degeneration in the lumbar spine but no significant arthritic changes in the shoulder or evidence of trauma.  On exam he has significant tenderness over the bicipital groove as well as increased pain with compression of the biceps muscle belly and a positive O'Brien's test.  He also has some passive range of motion crepitus specially in the right shoulder.  Ordered MRI of the right shoulder with arthrogram for further evaluation.  He has excellent range of motion and strength on exam today.  We will work-up the shoulder at this time as this is the main cause of his pain.  He has pain that wakes him up at night.  Follow-up after MRI to review results.  Follow-Up Instructions: No follow-ups on file.   Orders:  Orders Placed This Encounter  Procedures  . XR Shoulder Right  . XR Shoulder Left  . XR Lumbar Spine  2-3 Views  . MR SHOULDER RIGHT W CONTRAST  . Arthrogram   No orders of the defined types were placed in this encounter.     Procedures: No procedures performed   Clinical Data: No additional findings.  Objective: Vital Signs: There were no vitals taken for this visit.  Physical Exam:  Constitutional: Patient appears well-developed HEENT:  Head: Normocephalic Eyes:EOM are normal Neck: Normal range of motion Cardiovascular: Normal rate Pulmonary/chest: Effort normal Neurologic: Patient is alert Skin: Skin is warm Psychiatric: Patient has normal mood and affect  Ortho Exam:  Right shoulder Exam Tender to palpation over the bicipital groove.  Tenderness to palpation over the bicep muscle  belly.  Positive O'Brien's test.  Crepitus present with passive range of motion of the right shoulder and left shoulder. Able to fully forward flex and abduct shoulder overhead No loss of ER relative to the other shoulder.  Good endpoint with ER No TTP over the [facility] joint Good subscapularis, supraspinatus, and infraspinatus strength Negative Hawkins impingement 5/5 grip strength, forearm pronation/supination, and bicep strength  Back exam Mild tenderness to palpation throughout the right paraspinal musculature.  No tenderness to palpation throughout the axial lumbar spine or left paraspinal musculature.  5/5 motor strength of the bilateral hip flexors, quadriceps, hamstring, dorsiflexion, plantarflexion.  Sensation intact through all dermatomes of the bilateral lower extremities  Specialty Comments:  No specialty comments available.  Imaging: No results found.   PMFS History: Patient Active Problem List   Diagnosis Date Noted  . Atherosclerotic peripheral vascular disease with ulceration ([place]) 04/03/2015  . Coronary artery disease involving native coronary artery of native heart without angina pectoris 03/28/2014  . Aneurysm of iliac artery ([place]) 03/18/2014  . Abdominal pain, unspecified site 03/18/2014  . Syncope in setting of nausea and vomiting 03/07/14 03/08/2014  . Non compliance with medical treatment 02/28/2014  . Dyslipidemia, goal LDL below 70 02/28/2014  . Bradycardia- beta blocker decreased 02/28/2014  . Chronic diastolic CHF (congestive heart failure) ([place]) 02/28/2014  . Type 2 diabetes mellitus with manifestations ([place]) 02/27/2014  . TIA (transient ischemic attack) 02/26/2014  . History of non-ST elevation myocardial infarction (NSTEMI) 02/26/2014  . Weakness-Left arm / Lef 12/06/2013  . Mesenteric artery stenosis ([place]) 12/06/2013  . 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[place]) 12/03/2012  . Hypertension 11/10/2011  . History of CVA in adulthood 11/10/2011  . Language barrier  11/10/2011   Past Medical History:  Diagnosis Date  . CAD (coronary artery disease)    a. LHC (02/26/14):  mLAD 20 (faint L>R collats), mCFX 50, pRCA 95 (plaque rupture) >> PCI:  Xience DES to pRCA (severe tortuosity of R innominate artery >> needs L radial or FA in future);  b. LHC (03/10/14):  CFX 90, oOM 70, pRCA stent patent >> PCI:  Xience DES to mCFX   . Carotid stenosis    a. Carotid US (02/27/14):  Bilateral ICA 1-39%  . Depression   . Diabetes mellitus   . 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[name])    a. Mesenteric Artery Duplex (7/15):  pSMA chronic dissection with aneurysmal dilation of 1.29 cm (VVS);  b.  Chest CTA (02/26/14):  IMPRESSION:  1. No aortic dissection or other acute abnormality.  2. 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  3. Stable 17 mm ectasia of the left common iliac artery.  4. Atherosclerosis, including aortoiliac and coronary artery disease.   Marland Kitchen History of echocardiogram 09/2015   Echo 1/17: mod LVH, EF 55%, inf-lat HK, Gr 1 DD,  mild LAE  . Hx of cardiovascular stress test    Lexiscan Myoview (2/16):  Inferior lateral defect c/w thinning vs small prior infarct, no ischemia, EF 49%, Low Risk  . Hx of echocardiogram    a.  Echocardiogram (02/27/14):  Mod focal basal and mild concentric LVH, EF 50-55%, no RWMA, Gr 1 DD, mild TR, normal RVF  . Hyperlipidemia   . Hypertension   . Iliac artery aneurysm, left ([place])   . Myocardial infarction ([place])   . Stroke [facility]) 09/26/2009    Family History  Problem Relation Age of Onset  . Diabetes Father   . Hypertension Father   . Heart attack Father   . Stroke Neg Hx     Past Surgical History:  Procedure Laterality Date  . CARDIAC CATHETERIZATION    . CARDIAC CATHETERIZATION N/A 06/25/2015   Procedure: Left Heart Cath and Coronary Angiography;  Surgeon: Burnell Blanks, MD;  Location: [place] CV LAB;  Service: Cardiovascular;  Laterality: N/A;  . COLONOSCOPY WITH PROPOFOL N/A 07/23/2015   Procedure:  COLONOSCOPY WITH PROPOFOL;  Surgeon: [name], MD;  Location: [facility] ENDOSCOPY;  Service: Endoscopy;  Laterality: N/A;  . CORONARY ANGIOPLASTY    . LEFT HEART CATHETERIZATION WITH CORONARY ANGIOGRAM N/A 02/26/2014   Procedure: LEFT HEART CATHETERIZATION WITH CORONARY ANGIOGRAM;  Surgeon: Wellington Hampshire, MD;  Location: [place] CATH LAB;  Service: Cardiovascular;  Laterality: N/A;  . LEFT HEART CATHETERIZATION WITH CORONARY ANGIOGRAM N/A 03/10/2014   Procedure: LEFT HEART CATHETERIZATION WITH CORONARY ANGIOGRAM;  Surgeon: Jettie Booze, MD;  Location: [facility] CATH LAB;  Service: Cardiovascular;  Laterality: N/A;   Social History   Occupational History  . Occupation: Retired  Tobacco Use  . Smoking status: Former Smoker    Packs/day: 0.25    Years: 38.00    Pack years: 9.50    Types: Cigarettes    Quit date: 2008    Years since quitting: 13.3  . Smokeless tobacco: Never Used  Substance and Sexual Activity  . Alcohol use: No    Alcohol/week: 0.0 standard drinks  . Drug use: No  . Sexual activity: Yes

## 2020-01-18 ENCOUNTER — Encounter: Payer: Self-pay | Admitting: Orthopedic Surgery

## 2020-01-23 ENCOUNTER — Ambulatory Visit: Payer: Medicare Other | Admitting: Orthopedic Surgery

## 2020-01-27 ENCOUNTER — Other Ambulatory Visit: Payer: Self-pay

## 2020-01-27 ENCOUNTER — Ambulatory Visit (INDEPENDENT_AMBULATORY_CARE_PROVIDER_SITE_OTHER): Payer: Medicare Other | Admitting: Pharmacist

## 2020-01-27 ENCOUNTER — Ambulatory Visit: Payer: Medicare Other

## 2020-01-27 VITALS — BP 118/70 | HR 55 | Resp 98

## 2020-01-27 DIAGNOSIS — I10 Essential (primary) hypertension: Secondary | ICD-10-CM | POA: Diagnosis not present

## 2020-01-27 DIAGNOSIS — E785 Hyperlipidemia, unspecified: Secondary | ICD-10-CM

## 2020-01-27 DIAGNOSIS — E118 Type 2 diabetes mellitus with unspecified complications: Secondary | ICD-10-CM

## 2020-01-27 NOTE — Progress Notes (Signed)
Patient ID: Barry [name]                 DOB: [birth year]                      MRN: [id number]     HPI: Barry [name] is a 69 y.o. male referred by Barry Dopp, PA-C to HTN clinic. PMH is significant for CAD s/p DES in 2015, chronic superior mesenteric artery dissection, PAD, CVA in 2011, DM2, HTN, CKD, HDL, and AAA. Barry [name] presents with the assistance of his wife as an interpreter.  Barry [name] denies swelling, dizziness, but does report fatigue which is the same as it was prior to starting his BP medications. He reports BP readings of 150/85 and SBPs of 128, 139 on most days. He does report that he keeps a log of his BP readings but did not bring them to the visit. He reports adherence to his BP medications daily. He does report checking his BP at home with his machine.  Of note, Barry [name] has uncontrolled diabetes, with a recent HgA1c of 12.1. He reports he understands that his glucose readings are high.  Furthermore, Barry [name] was prescribed Praluent for HLD, which he has stopped taking due to stomach bloating.   Current HTN meds: amlodipine 5 mg daily, lisinopril 10 mg daily Previously tried:   BP goal: <130/80  Family History: Diabetes in his father; Heart attack in his father; Hypertension in his father. There is no history of Stroke.  Social History: Former smoker 1/4 PPD for 38 years, quit in 2008. Denies alcohol and illicit drug use. No drinking now but was a social drinker in the past. Pt is married and lives with son and wife. Has 3 children and 6 grandchildren.  Diet: Rice, chicken soup, fish, sometimes fried food, vegetables, fruits, 1 cup of coffee daily  Exercise: Walks about 1 mile around neighborhood almost everyday  Home BP readings: patient did not bring  Wt Readings from Last 3 Encounters:  01/06/20 155 lb (70.3 kg)  12/19/19 160 lb (72.6 kg)  12/13/19 160 lb 6.4 oz (72.8 kg)   BP Readings from Last 3 Encounters:  01/06/20 (!) 147/80  12/19/19 (!) 152/94  12/13/19 (!) 150/80    Pulse Readings from Last 3 Encounters:  01/06/20 60  12/19/19 63  12/13/19 87    Renal function: CrCl cannot be calculated (Unknown ideal weight.).  Past Medical History:  Diagnosis Date  . CAD (coronary artery disease)    a. LHC (02/26/14):  mLAD 20 (faint L>R collats), mCFX 50, pRCA 95 (plaque rupture) >> PCI:  Xience DES to pRCA (severe tortuosity of R innominate artery >> needs L radial or FA in future);  b. LHC (03/10/14):  CFX 90, oOM 70, pRCA stent patent >> PCI:  Xience DES to mCFX   . Carotid stenosis    a. Carotid US (02/27/14):  Bilateral ICA 1-39%  . Depression   . Diabetes mellitus   . 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[place])    a. Mesenteric Artery Duplex (7/15):  pSMA chronic dissection with aneurysmal dilation of 1.29 cm (VVS);  b.  Chest CTA (02/26/14):  IMPRESSION:  1. No aortic dissection or other acute abnormality.  2. 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  3. Stable 17 mm ectasia of the left common iliac artery.  4. Atherosclerosis, including aortoiliac and coronary artery disease.   Marland Kitchen History of echocardiogram 09/2015   Echo 1/17: mod LVH, EF  55%, inf-lat HK, Gr 1 DD, mild LAE  . Hx of cardiovascular stress test    Lexiscan Myoview (2/16):  Inferior lateral defect c/w thinning vs small prior infarct, no ischemia, EF 49%, Low Risk  . Hx of echocardiogram    a.  Echocardiogram (02/27/14):  Mod focal basal and mild concentric LVH, EF 50-55%, no RWMA, Gr 1 DD, mild TR, normal RVF  . Hyperlipidemia   . Hypertension   . Iliac artery aneurysm, left ([place])   . Myocardial infarction ([place])   . Stroke ([place]) 09/26/2009    Current Outpatient Medications on File Prior to Visit  Medication Sig Dispense Refill  . Alirocumab (PRALUENT) 75 MG/ML SOAJ Inject 75 mg into the skin every 14 (fourteen) days. 2 pen 11  . amLODipine (NORVASC) 5 MG tablet Take 1 tablet (5 mg total) by mouth daily. (Patient not taking: Reported on 01/06/2020) 30 tablet 11  . aspirin EC 81 MG EC  tablet Take 1 tablet (81 mg total) by mouth daily.    . blood glucose meter kit and supplies Dispense based on patient and insurance preference. Use up to four times daily as directed. (FOR ICD-10 E10.9, E11.9). 1 each 0  . gabapentin (NEURONTIN) 300 MG capsule Take 1 capsule (300 mg total) by mouth at bedtime. (Patient not taking: Reported on 01/06/2020) 90 capsule 3  . glipiZIDE (GLUCOTROL) 5 MG tablet Take 1 tablet (5 mg total) by mouth daily before breakfast. 30 tablet 2  . glucose blood (ONE TOUCH ULTRA TEST) test strip 1 each by Other route daily. And lancets 1/day 100 each 3  . lisinopril (ZESTRIL) 20 MG tablet Take 0.5 tablets (10 mg total) by mouth daily. 15 tablet 11  . rosuvastatin (CRESTOR) 40 MG tablet Take 1 tablet (40 mg total) by mouth daily. 90 tablet 3  . TECHLITE PEN NEEDLES 32G X 8 MM MISC USE ONE PEN NEEDLE TO CHECK BLOOD GLUCOSE ONCE DAILY 100 each 3  . valACYclovir (VALTREX) 1000 MG tablet Take 1 tablet (1,000 mg total) by mouth 3 (three) times daily. 21 tablet 0   No current facility-administered medications on file prior to visit.    Allergies  Allergen Reactions  . Metformin And Related     Pt feels that this causes constipation and will not take      Assessment/Plan:  1. Hypertension - BP in clinic was 118/70, which is within goal of <130/80. HR is 55, which reduces ability to add beta blocker. Reading was confirmed by another pharmacist. Continue taking amlodipine 5 mg daily and lisinopril 10 mg daily. Patient has been asked to keep a log of blood pressure readings and bring to next visit. I have also instructed him to bring blood pressure machine Gimbel we can compare it's accuracy with our manual cuff. Continue to reduce sodium intake by reducing amount of soup in diet. I recommended that doing as much walking as possible will also help reduce BP further. Follow up in HTN clinic in 3 weeks.  2.) Uncontrolled DM - Barry [name] glucose levels are elevated and he is due for  a follow up with Endocrinologist, Dr. Loanne Drilling. I have instructed the patient and wife to make an appointment with Dr. Loanne Drilling for DM follow up.  3.) HLD -  Mr. Franqui has HLD with most recent lipid panel in 11/21/2019 showing an LDL of 117. His goal LDL is <70. He was prescribed Praluent to bring LDL down to goal, however he reports to me today that  he has stopped taking it due to stomach bloating.  Choosing a different injection site may overcome this hesitancy with therapy.  We will address this at next visit.  Thank you,  Sherren Kerns, PharmD PGY1 Acute Care Pharmacy Resident

## 2020-01-27 NOTE — Patient Instructions (Addendum)
It was great seeing you today!  Your blood pressure in the clinic was within goal of <130/80. BP in clinic was 118/70.   Continue to check blood pressure daily and keep a list of the readings. Bring the blood pressure measurement log with you at next visit.  Continue taking 10 mg of lisinopril daily and 5 mg of amlodipine daily for blood pressure.  Please make appointment with Dr. Loanne Drilling regarding your diabetes management.  Follow-up with HLD clinic about cholesterol management.  Please call us at the clinic if you have any questions!

## 2020-02-05 ENCOUNTER — Ambulatory Visit
Admission: RE | Admit: 2020-02-05 | Discharge: 2020-02-05 | Disposition: A | Discharge status: Home / Self Care | Payer: Medicare Other | Source: Ambulatory Visit | Attending: Orthopedic Surgery | Admitting: Orthopedic Surgery

## 2020-02-05 ENCOUNTER — Other Ambulatory Visit: Payer: Self-pay

## 2020-02-05 DIAGNOSIS — M25511 Pain in right shoulder: Secondary | ICD-10-CM

## 2020-02-05 DIAGNOSIS — M75121 Complete rotator cuff tear or rupture of right shoulder, not specified as traumatic: Secondary | ICD-10-CM | POA: Diagnosis not present

## 2020-02-05 MED ORDER — IOPAMIDOL (ISOVUE-M 200) INJECTION 41%
20.0000 mL | Freq: Once | INTRAMUSCULAR | Status: AC
  Administered 2020-02-05: 20 mL via INTRA_ARTICULAR

## 2020-02-12 ENCOUNTER — Other Ambulatory Visit: Payer: Self-pay

## 2020-02-12 ENCOUNTER — Telehealth: Payer: Self-pay | Admitting: Radiology

## 2020-02-12 ENCOUNTER — Ambulatory Visit: Payer: Medicare Other | Admitting: Orthopedic Surgery

## 2020-02-12 NOTE — Telephone Encounter (Signed)
I called and talked to his son.  Son was unaware that this patient was they have been having issues with the shoulder.  Hemoglobin A1c twelve 1 month ago.  Does have a small rotator cuff tear which may or may not need surgery depending on his clinical symptoms.  Advised the son to call back once his blood glucose hemoglobin A1c was under 8 and we could consider an surgical intervention at that time.

## 2020-02-12 NOTE — Telephone Encounter (Signed)
Patient came in for MRI review today but left due to wait. Could you please call with results?  Cb [phone]

## 2020-02-13 NOTE — Telephone Encounter (Signed)
FYI. Just wanted you to be aware.

## 2020-02-17 ENCOUNTER — Ambulatory Visit (INDEPENDENT_AMBULATORY_CARE_PROVIDER_SITE_OTHER): Payer: Medicare Other | Admitting: Pharmacist

## 2020-02-17 ENCOUNTER — Other Ambulatory Visit: Payer: Self-pay

## 2020-02-17 VITALS — BP 110/76 | HR 58

## 2020-02-17 DIAGNOSIS — I10 Essential (primary) hypertension: Secondary | ICD-10-CM

## 2020-02-17 DIAGNOSIS — E785 Hyperlipidemia, unspecified: Secondary | ICD-10-CM | POA: Diagnosis not present

## 2020-02-17 MED ORDER — EZETIMIBE 10 MG PO TABS
10.0000 mg | ORAL_TABLET | Freq: Every day | ORAL | 3 refills | Status: DC
Start: 2020-02-17 — End: 2020-10-08

## 2020-02-17 MED ORDER — BLOOD GLUCOSE MONITORING SUPPL W/DEVICE KIT: 1.0000 | PACK | 0 refills | Status: DC

## 2020-02-17 NOTE — Progress Notes (Signed)
Patient ID: Barry [name]                 DOB: 08/29/1951                      MRN: [id number]     HPI: Barry [name] is a 69 y.o. male referred by Barry Dopp, PA-C to HTN clinic. PMH is significant for CAD s/p DES in 2015, chronic superior mesenteric artery dissection, PAD, CVA in 2011, DM2, HTN, CKD, HDL, and AAA. Mr.   [name] presents with the assistance of his wife as an interpreter.  At last visit, patient blood pressure was at goal in clinic. He reported his blood pressure beings elevated at home. No changes were made and patient was asked to bring his home monitor to the next visit.  Patient presents today for follow up. His blood pressures at home are mostly at goal. Blood sugars still high. He had one "low" of 90 where he felt shakey. He ate chocolate and felt better. Says his blood pressure cuff is broken and requests new one. Also states his lancet device does not work. Requests new one as well. He is still walking daily. Denies dizziness, lightheadedness or headache. Vision is blurry when he is tired.  He has stopped taking Praluent because it made him feel bad. Asking about the COVID, if he should get and which one.  Current HTN meds: amlodipine 5 mg daily, lisinopril 10 mg daily Previously tried:   BP goal: <130/80  Family History: Diabetes in his father; Heart attack in his father; Hypertension in his father. There is no history of Stroke.  Social History: Former smoker 1/4 PPD for 38 years, quit in 2008. Denies alcohol and illicit drug use. No drinking now but was a social drinker in the past. Pt is married and lives with son and wife. Has 3 children and 6 grandchildren.  Diet: Rice, chicken soup, fish, sometimes fried food, vegetables, fruits, 1 cup of coffee daily  Exercise: Walks about 1 mile around neighborhood almost everyday (about 30 min or more) gardening   Home BP readings: 116/68, 114/73, 109/71, 120/78, 141/84, 134/81, 101/68, 124/73, 133/  Wt Readings from Last 3  Encounters:  01/06/20 155 lb (70.3 kg)  12/19/19 160 lb (72.6 kg)  12/13/19 160 lb 6.4 oz (72.8 kg)   BP Readings from Last 3 Encounters:  01/27/20 118/70  01/06/20 (!) 147/80  12/19/19 (!) 152/94   Pulse Readings from Last 3 Encounters:  01/27/20 (!) 55  01/06/20 60  12/19/19 63    Renal function: CrCl cannot be calculated (Patient's most recent lab result is older than the maximum 21 days allowed.).  Past Medical History:  Diagnosis Date  . CAD (coronary artery disease)    a. LHC (02/26/14):  mLAD 20 (faint L>R collats), mCFX 50, pRCA 95 (plaque rupture) >> PCI:  Xience DES to pRCA (severe tortuosity of R innominate artery >> needs L radial or FA in future);  b. LHC (03/10/14):  CFX 90, oOM 70, pRCA stent patent >> PCI:  Xience DES to mCFX   . Carotid stenosis    a. Carotid US (02/27/14):  Bilateral ICA 1-39%  . Depression   . Diabetes mellitus   . 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[place])    a. Mesenteric Artery Duplex (7/15):  pSMA chronic dissection with aneurysmal dilation of 1.29 cm (VVS);  b.  Chest CTA (02/26/14):  IMPRESSION:  1. No aortic dissection or other acute abnormality.  2. 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  3. Stable 17 mm ectasia of the left common iliac artery.  4. Atherosclerosis, including aortoiliac and coronary artery disease.   Marland Kitchen History of echocardiogram 09/2015   Echo 1/17: mod LVH, EF 55%, inf-lat HK, Gr 1 DD, mild LAE  . Hx of cardiovascular stress test    Lexiscan Myoview (2/16):  Inferior lateral defect c/w thinning vs small prior infarct, no ischemia, EF 49%, Low Risk  . Hx of echocardiogram    a.  Echocardiogram (02/27/14):  Mod focal basal and mild concentric LVH, EF 50-55%, no RWMA, Gr 1 DD, mild TR, normal RVF  . Hyperlipidemia   . Hypertension   . Iliac artery aneurysm, left ([place])   . Myocardial infarction ([place])   . Stroke ([place]) 09/26/2009    Current Outpatient Medications on File Prior to Visit  Medication Sig Dispense Refill  .  Alirocumab (PRALUENT) 75 MG/ML SOAJ Inject 75 mg into the skin every 14 (fourteen) days. 2 pen 11  . amLODipine (NORVASC) 5 MG tablet Take 1 tablet (5 mg total) by mouth daily. (Patient not taking: Reported on 01/06/2020) 30 tablet 11  . aspirin EC 81 MG EC tablet Take 1 tablet (81 mg total) by mouth daily.    . blood glucose meter kit and supplies Dispense based on patient and insurance preference. Use up to four times daily as directed. (FOR ICD-10 E10.9, E11.9). 1 each 0  . gabapentin (NEURONTIN) 300 MG capsule Take 1 capsule (300 mg total) by mouth at bedtime. (Patient not taking: Reported on 01/06/2020) 90 capsule 3  . glipiZIDE (GLUCOTROL) 5 MG tablet Take 1 tablet (5 mg total) by mouth daily before breakfast. 30 tablet 2  . glucose blood (ONE TOUCH ULTRA TEST) test strip 1 each by Other route daily. And lancets 1/day 100 each 3  . lisinopril (ZESTRIL) 20 MG tablet Take 0.5 tablets (10 mg total) by mouth daily. 15 tablet 11  . rosuvastatin (CRESTOR) 40 MG tablet Take 1 tablet (40 mg total) by mouth daily. 90 tablet 3  . TECHLITE PEN NEEDLES 32G X 8 MM MISC USE ONE PEN NEEDLE TO CHECK BLOOD GLUCOSE ONCE DAILY 100 each 3  . valACYclovir (VALTREX) 1000 MG tablet Take 1 tablet (1,000 mg total) by mouth 3 (three) times daily. 21 tablet 0   No current facility-administered medications on file prior to visit.    Allergies  Allergen Reactions  . Metformin And Related     Pt feels that this causes constipation and will not take      Assessment/Plan:  1. Hypertension - Blood pressure is at goal of <130/80 in clinic today. No changes to medications. Continue amlodipine 91m daily and lisinopril 122mdaily. I provided him with a new blood pressure cuff.  2.) Uncontrolled DM - Barry [name] levels are elevated and he is due for a follow up with Endocrinologist, Dr. ElLoanne DrillingI have instructed the patient and wife to make an appointment with Dr. ElLoanne Drillingor DM follow up. He did not seem to know who  Dr. ElLoanne Drillingas. I did instruct him to at least make an appointment with Dr. CoLorelei [name] discuss. I wrote him an Rx to bring to pharmacy for new blood glucose meter and lancet device.  3.) HLD -  Mr. Gillespie has HLD with most recent lipid panel in 11/21/2019 showing an LDL of 117. His goal LDL is <70. He does not want to take Praluent or try Repatha. Therefore  will start Zetia and recheck labs in 12 weeks.  4.) COVID Vaccine- [facility] but patient did not have email address needed to make appointment. Provided times the clinic is open for patient-they do take walk ins. States he will go tomorrow. Recommended the [employer] or Moderna vaccine over [employer], but that all COVID vaccine are good.  Thank you,  Ramond Dial, Pharm.D, BCPS, CPP [facility]  [address], [place], [place] [address]  Phone: [phone]; Fax: [phone]

## 2020-02-17 NOTE — Patient Instructions (Addendum)
Please continue taking  amlodipine 5 mg daily and lisinopril 10 mg daily.  START taking Zetia 10mg  daily. CONTINUE taking rosuvastatin 40mg  daily  Please make an appointment with Dr. Lorelei Pont or Dr. Loanne Drilling about your blood sugar  Make an appointment to get your COVID shot. Pfizer or [employer].  Call us at [phone] with any questions or concerns.

## 2020-02-21 ENCOUNTER — Encounter: Payer: Self-pay | Admitting: Orthopedic Surgery

## 2020-02-21 ENCOUNTER — Other Ambulatory Visit: Payer: Self-pay

## 2020-02-21 ENCOUNTER — Ambulatory Visit: Payer: Medicare Other | Admitting: Orthopedic Surgery

## 2020-02-21 VITALS — Ht 65.0 in | Wt 155.0 lb

## 2020-02-21 DIAGNOSIS — M25511 Pain in right shoulder: Secondary | ICD-10-CM

## 2020-02-22 ENCOUNTER — Encounter: Payer: Self-pay | Admitting: Orthopedic Surgery

## 2020-02-22 NOTE — Progress Notes (Signed)
Office Visit Note   Patient: Barry [name]           Date of Birth: 08/08/1951           MRN: [id number] Visit Date: 02/21/2020 Requested by: Darreld Mclean, MD [address] [place],  [place] [id number] PCP: Darreld Mclean, MD  Subjective: Chief Complaint  Patient presents with  . Right Shoulder - Pain, Follow-up    MRI review    HPI:   Patient presents for evaluation of right shoulder.  Pain following MRI scan.  MRI scan is reviewed and it shows a small rotator cuff tear supraspinatus.  He states that the shoulder is actually better now but he is reporting significant pain weakness and tingling in his right hand.  He is not taking pain medicine. ROS: All systems reviewed are negative as they relate to the chief complaint within the history of present illness.  Patient denies  fevers or chills.   Assessment & Plan: Visit Diagnoses:  1. Right shoulder pain, unspecified chronicity     Plan: Impression is fairly severe right shoulder pain which does not match up with his MRI scan.  MRI scan shows small nonretracted potentially not even full-thickness rotator cuff tear.  He is having whole arm radicular symptoms without much neck pain.  This could be coming from his hand.  He is having a lot of palmar numbness and tingling.  Needs nerve conduction right upper extremity to evaluate carpal tunnel syndrome.  Based on his elevated hemoglobin A1c I do not think elective shoulder surgery is indicated at this time.  Conceivably carpal tunnel release could be considered but even that would be risky for infection.  We will see what that study shows and see him back at that time  Follow-Up Instructions: No follow-ups on file.   Orders:  Orders Placed This Encounter  Procedures  . Ambulatory referral to Physical Medicine Rehab   No orders of the defined types were placed in this encounter.     Procedures: No procedures performed   Clinical Data: No additional  findings.  Objective: Vital Signs: Ht 5\' 5"  (1.651 m)   Wt 155 lb (70.3 kg)   BMI 25.79 kg/m   Physical Exam:   Constitutional: Patient appears well-developed HEENT:  Head: Normocephalic Eyes:EOM are normal Neck: Normal range of motion Cardiovascular: Normal rate Pulmonary/chest: Effort normal Neurologic: Patient is alert Skin: Skin is warm Psychiatric: Patient has normal mood and affect    Ortho Exam: Ortho exam demonstrates full active and passive range of motion of the right wrist.  He has pretty good shoulder range of motion with not much weakness infraspinatus supraspinatus testing.  Not much in the way of coarse grinding or crepitus right shoulder versus left.  Negative O'Brien's testing no discrete AC joint tenderness is present.  Cervical spine range of motion is full.  No other masses lymphadenopathy or skin changes noted in that shoulder region.  Negative Tinel's cubital tunnel in the elbow.  Positive carpal tunnel compression testing.  Positive radial pulse on the right-hand side.  No abductor pollicis brevis wasting on the right.  Specialty Comments:  No specialty comments available.  Imaging: No results found.   PMFS History: Patient Active Problem List   Diagnosis Date Noted  . Atherosclerotic peripheral vascular disease with ulceration ([place]) 04/03/2015  . Coronary artery disease involving native coronary artery of native heart without angina pectoris 03/28/2014  . Aneurysm of iliac artery ([place]) 03/18/2014  .  Abdominal pain, unspecified site 03/18/2014  . Syncope in setting of nausea and vomiting 03/07/14 03/08/2014  . Non compliance with medical treatment 02/28/2014  . Dyslipidemia, goal LDL below 70 02/28/2014  . Bradycardia- beta blocker decreased 02/28/2014  . Chronic diastolic CHF (congestive heart failure) ([place]) 02/28/2014  . Type 2 diabetes mellitus with manifestations ([place]) 02/27/2014  . TIA (transient ischemic attack) 02/26/2014  . History of non-ST  elevation myocardial infarction (NSTEMI) 02/26/2014  . Weakness-Left arm / Lef 12/06/2013  . Mesenteric artery stenosis ([place]) 12/06/2013  . 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[place]) 12/03/2012  . Hypertension 11/10/2011  . History of CVA in adulthood 11/10/2011  . Language barrier 11/10/2011   Past Medical History:  Diagnosis Date  . CAD (coronary artery disease)    a. LHC (02/26/14):  mLAD 20 (faint L>R collats), mCFX 50, pRCA 95 (plaque rupture) >> PCI:  Xience DES to pRCA (severe tortuosity of R innominate artery >> needs L radial or FA in future);  b. LHC (03/10/14):  CFX 90, oOM 70, pRCA stent patent >> PCI:  Xience DES to mCFX   . Carotid stenosis    a. Carotid US (02/27/14):  Bilateral ICA 1-39%  . Depression   . Diabetes mellitus   . 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[place])    a. Mesenteric Artery Duplex (7/15):  pSMA chronic dissection with aneurysmal dilation of 1.29 cm (VVS);  b.  Chest CTA (02/26/14):  IMPRESSION:  1. No aortic dissection or other acute abnormality.  2. 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  3. Stable 17 mm ectasia of the left common iliac artery.  4. Atherosclerosis, including aortoiliac and coronary artery disease.   Marland Kitchen History of echocardiogram 09/2015   Echo 1/17: mod LVH, EF 55%, inf-lat HK, Gr 1 DD, mild LAE  . Hx of cardiovascular stress test    Lexiscan Myoview (2/16):  Inferior lateral defect c/w thinning vs small prior infarct, no ischemia, EF 49%, Low Risk  . Hx of echocardiogram    a.  Echocardiogram (02/27/14):  Mod focal basal and mild concentric LVH, EF 50-55%, no RWMA, Gr 1 DD, mild TR, normal RVF  . Hyperlipidemia   . Hypertension   . Iliac artery aneurysm, left ([place])   . Myocardial infarction ([place])   . Stroke [facility]) 09/26/2009    Family History  Problem Relation Age of Onset  . Diabetes Father   . Hypertension Father   . Heart attack Father   . Stroke Neg Hx     Past Surgical History:  Procedure Laterality Date  . CARDIAC  CATHETERIZATION    . CARDIAC CATHETERIZATION N/A 06/25/2015   Procedure: Left Heart Cath and Coronary Angiography;  Surgeon: Burnell Blanks, MD;  Location: [facility];  Service: Cardiovascular;  Laterality: N/A;  . COLONOSCOPY WITH PROPOFOL N/A 07/23/2015   Procedure: COLONOSCOPY WITH PROPOFOL;  Surgeon: [name], MD;  Location: [facility] ENDOSCOPY;  Service: Endoscopy;  Laterality: N/A;  . CORONARY ANGIOPLASTY    . LEFT HEART CATHETERIZATION WITH CORONARY ANGIOGRAM N/A 02/26/2014   Procedure: LEFT HEART CATHETERIZATION WITH CORONARY ANGIOGRAM;  Surgeon: Wellington Hampshire, MD;  Location: [facility] CATH LAB;  Service: Cardiovascular;  Laterality: N/A;  . LEFT HEART CATHETERIZATION WITH CORONARY ANGIOGRAM N/A 03/10/2014   Procedure: LEFT HEART CATHETERIZATION WITH CORONARY ANGIOGRAM;  Surgeon: Jettie Booze, MD;  Location[facility] CATH LAB;  Service: Cardiovascular;  Laterality: N/A;   Social History   Occupational History  . Occupation: Retired  Tobacco Use  .  Smoking status: Former Smoker    Packs/day: 0.25    Years: 38.00    Pack years: 9.50    Types: Cigarettes    Quit date: 2008    Years since quitting: 13.4  . Smokeless tobacco: Never Used  Substance and Sexual Activity  . Alcohol use: No    Alcohol/week: 0.0 standard drinks  . Drug use: No  . Sexual activity: Yes

## 2020-03-02 ENCOUNTER — Other Ambulatory Visit: Payer: Medicare Other

## 2020-03-18 ENCOUNTER — Other Ambulatory Visit: Payer: Self-pay | Admitting: Internal Medicine

## 2020-03-18 DIAGNOSIS — E118 Type 2 diabetes mellitus with unspecified complications: Secondary | ICD-10-CM

## 2020-03-24 ENCOUNTER — Other Ambulatory Visit: Payer: Self-pay | Admitting: Internal Medicine

## 2020-03-24 DIAGNOSIS — E118 Type 2 diabetes mellitus with unspecified complications: Secondary | ICD-10-CM

## 2020-03-31 ENCOUNTER — Other Ambulatory Visit: Payer: Self-pay | Admitting: Internal Medicine

## 2020-03-31 DIAGNOSIS — E118 Type 2 diabetes mellitus with unspecified complications: Secondary | ICD-10-CM

## 2020-04-06 ENCOUNTER — Other Ambulatory Visit: Payer: Self-pay | Admitting: Internal Medicine

## 2020-04-06 DIAGNOSIS — E118 Type 2 diabetes mellitus with unspecified complications: Secondary | ICD-10-CM

## 2020-04-07 ENCOUNTER — Other Ambulatory Visit: Payer: Self-pay | Admitting: Internal Medicine

## 2020-04-07 DIAGNOSIS — E118 Type 2 diabetes mellitus with unspecified complications: Secondary | ICD-10-CM

## 2020-04-08 ENCOUNTER — Other Ambulatory Visit: Payer: Self-pay | Admitting: Internal Medicine

## 2020-04-08 DIAGNOSIS — E118 Type 2 diabetes mellitus with unspecified complications: Secondary | ICD-10-CM

## 2020-04-09 ENCOUNTER — Telehealth: Payer: Self-pay | Admitting: Internal Medicine

## 2020-04-09 ENCOUNTER — Other Ambulatory Visit: Payer: Self-pay | Admitting: Internal Medicine

## 2020-04-09 DIAGNOSIS — E118 Type 2 diabetes mellitus with unspecified complications: Secondary | ICD-10-CM

## 2020-04-09 NOTE — Telephone Encounter (Signed)
Patient scheduled for follow up on 05/13/20 - requesting refill for Glipizide sent to :  [facility] ([address] [place] [address] Phone:  [phone]  Fax:  [phone]

## 2020-04-10 ENCOUNTER — Other Ambulatory Visit: Payer: Self-pay

## 2020-04-10 DIAGNOSIS — E118 Type 2 diabetes mellitus with unspecified complications: Secondary | ICD-10-CM

## 2020-04-10 MED ORDER — GLIPIZIDE 5 MG PO TABS: 5.0000 mg | ORAL_TABLET | Freq: Every day | ORAL | 0 refills | Status: DC

## 2020-04-10 NOTE — Telephone Encounter (Signed)
Rx sent 

## 2020-04-17 ENCOUNTER — Other Ambulatory Visit: Payer: Self-pay

## 2020-04-17 ENCOUNTER — Encounter: Payer: Self-pay | Admitting: Physical Medicine and Rehabilitation

## 2020-04-17 ENCOUNTER — Ambulatory Visit (INDEPENDENT_AMBULATORY_CARE_PROVIDER_SITE_OTHER): Payer: Medicare Other | Admitting: Physical Medicine and Rehabilitation

## 2020-04-17 DIAGNOSIS — R202 Paresthesia of skin: Secondary | ICD-10-CM | POA: Diagnosis not present

## 2020-04-17 NOTE — Progress Notes (Signed)
Right shoulder, arm, hand pain and numbness.  Numeric Pain Rating Scale and Functional Assessment Average Pain 9   In the last MONTH (on 0-10 scale) has pain interfered with the following?  1. General activity like being  able to carry out your everyday physical activities such as walking, climbing stairs, carrying groceries, or moving a chair?  Rating(9)

## 2020-04-20 ENCOUNTER — Encounter: Payer: Self-pay | Admitting: Physical Medicine and Rehabilitation

## 2020-04-20 NOTE — Progress Notes (Signed)
Barry [name] - 69 y.o. male MRN [id number]  Date of birth: 1951-02-05  Office Visit Note: Visit Date: 04/17/2020 PCP: Darreld Mclean, MD Referred by: Darreld Mclean, MD  Subjective: Chief Complaint  Patient presents with  . Right Shoulder - Pain  . Right Hand - Pain, Numbness   HPI: Barry [name] is a 69 y.o. male who comes in today at the request of Dr. Anderson Malta for electrodiagnostic study of the Right upper extremities.  Patient is Right hand dominant.  Patient is [place] is here with a relative acting as interpreter.  He reports pain in the right shoulder as his main complaint but also with some tingling numbness in the right hand globally.  He feels like all the digits in the hand are equal and equal dorsal and palmar.  No left-sided complaints.  His history is that he saw Dr. Marlou Sa in April for right shoulder pain as well as low back pain.  Ultimately MRI of the shoulder was performed showing small tear which Dr. Marlou Sa felt like his pain was out of proportion with that MRI finding.  He does have more pain of the shoulder with movement.  At least today he is not having radicular complaints all the way down the arm.  His symptoms on the hands seem to be worse at night and with activity.  He has not had prior electrodiagnostic studies.  He does have a history of uncontrolled or poorly controlled diabetes.  His last hemoglobin A1c was over 12.  ROS Otherwise per HPI.  Assessment & Plan: Visit Diagnoses:  1. Paresthesia of skin     Plan: Impression: The above electrodiagnostic study is ABNORMAL and reveals evidence of a moderate right median nerve entrapment at the wrist (carpal tunnel syndrome) affecting sensory and motor components.   There is no significant electrodiagnostic evidence of any other focal nerve entrapment, brachial plexopathy or cervical radiculopathy.   Recommendations: 1.  Follow-up with referring physician. 2.  Continue current management of symptoms. 3.   Continue use of resting splint at night-time and as needed during the day.   Meds & Orders: No orders of the defined types were placed in this encounter.   Orders Placed This Encounter  Procedures  . NCV with EMG (electromyography)    Follow-up: Return for  Anderson Malta, M.D..   Procedures: No procedures performed  EMG & NCV Findings: Evaluation of the right median motor nerve showed prolonged distal onset latency (4.4 ms) and decreased conduction velocity (Elbow-Wrist, 47 m/s).  The right median (across palm) sensory nerve showed no response (Palm) and prolonged distal peak latency (3.7 ms).  The right ulnar sensory nerve showed prolonged distal peak latency (3.8 ms), reduced amplitude (13.2 V), and decreased conduction velocity (Wrist-5th Digit, 37 m/s).  All remaining nerves (as indicated in the following tables) were within normal limits.    All examined muscles (as indicated in the following table) showed no evidence of electrical instability.    Impression: The above electrodiagnostic study is ABNORMAL and reveals evidence of a moderate right median nerve entrapment at the wrist (carpal tunnel syndrome) affecting sensory and motor components.   There is no significant electrodiagnostic evidence of any other focal nerve entrapment, brachial plexopathy or cervical radiculopathy.   Recommendations: 1.  Follow-up with referring physician. 2.  Continue current management of symptoms. 3.  Continue use of resting splint at night-time and as needed during the day.  ___________________________ Wonda Olds Board Certified, American  Board of Physical Medicine and Rehabilitation    Nerve Conduction Studies Anti Sensory Summary Table   Stim Site NR Peak (ms) Norm Peak (ms) P-T Amp (V) Norm P-T Amp Site1 Site2 Delta-P (ms) Dist (cm) Vel (m/s) Norm Vel (m/s)  Right Median Acr Palm Anti Sensory (2nd Digit)  30.3C  Wrist    *3.7 <3.6 17.3 >10 Wrist Palm  0.0    Palm *NR  <2.0           Right Radial Anti Sensory (Base 1st Digit)  30C  Wrist    2.4 <3.1 12.5  Wrist Base 1st Digit 2.4 0.0    Right Ulnar Anti Sensory (5th Digit)  30.2C  Wrist    *3.8 <3.7 *13.2 >15.0 Wrist 5th Digit 3.8 14.0 *37 >38   Motor Summary Table   Stim Site NR Onset (ms) Norm Onset (ms) O-P Amp (mV) Norm O-P Amp Site1 Site2 Delta-0 (ms) Dist (cm) Vel (m/s) Norm Vel (m/s)  Right Median Motor (Abd Poll Brev)  30C  Wrist    *4.4 <4.2 8.0 >5 Elbow Wrist 4.4 20.5 *47 >50  Elbow    8.8  7.9         Right Ulnar Motor (Abd Dig Min)  30.1C  Wrist    3.7 <4.2 8.0 >3 B Elbow Wrist 3.8 22.0 58 >53  B Elbow    7.5  8.3  A Elbow B Elbow 1.8 10.0 56 >53  A Elbow    9.3  8.4          EMG   Side Muscle Nerve Root Ins Act Fibs Psw Amp Dur Poly Recrt Int Fraser Din Comment  Right 1stDorInt Ulnar C8-T1 Nml Nml Nml Nml Nml 0 Nml Nml   Right Abd Poll Brev Median C8-T1 Nml Nml Nml Nml Nml 0 Nml Nml   Right ExtDigCom   Nml Nml Nml Nml Nml 0 Nml Nml   Right Triceps Radial C6-7-8 Nml Nml Nml Nml Nml 0 Nml Nml   Right Deltoid Axillary C5-6 Nml Nml Nml Nml Nml 0 Nml Nml     Nerve Conduction Studies Anti Sensory Left/Right Comparison   Stim Site L Lat (ms) R Lat (ms) L-R Lat (ms) L Amp (V) R Amp (V) L-R Amp (%) Site1 Site2 L Vel (m/s) R Vel (m/s) L-R Vel (m/s)  Median Acr Palm Anti Sensory (2nd Digit)  30.3C  Wrist  *3.7   17.3  Wrist Palm     Palm             Radial Anti Sensory (Base 1st Digit)  30C  Wrist  2.4   12.5  Wrist Base 1st Digit     Ulnar Anti Sensory (5th Digit)  30.2C  Wrist  *3.8   *13.2  Wrist 5th Digit  *37    Motor Left/Right Comparison   Stim Site L Lat (ms) R Lat (ms) L-R Lat (ms) L Amp (mV) R Amp (mV) L-R Amp (%) Site1 Site2 L Vel (m/s) R Vel (m/s) L-R Vel (m/s)  Median Motor (Abd Poll Brev)  30C  Wrist  *4.4   8.0  Elbow Wrist  *47   Elbow  8.8   7.9        Ulnar Motor (Abd Dig Min)  30.1C  Wrist  3.7   8.0  B Elbow Wrist  58   B Elbow  7.5   8.3  A Elbow B Elbow  56   A  Elbow  9.3  8.4           Waveforms:             Clinical History: No specialty comments available.   He reports that he quit smoking about 13 years ago. His smoking use included cigarettes. He has a 9.50 pack-year smoking history. He has never used smokeless tobacco.  Recent Labs    01/06/20 1045  HGBA1C 12.1*    Objective:  VS:  HT:    WT:   BMI:     BP:   HR: bpm  TEMP: ( )  RESP:  Physical Exam Musculoskeletal:        General: No tenderness.     Comments: Inspection reveals multiple skin tattoos of the upper extremities bilaterally but no atrophy of the bilateral APB or FDI or hand intrinsics. There is no swelling, color changes, allodynia or dystrophic changes. There is 5 out of 5 strength in the bilateral wrist extension, finger abduction and long finger flexion. There is intact sensation to light touch in all dermatomal and peripheral nerve distributions. There is a negative Hoffmann's test bilaterally.  Skin:    General: Skin is warm and dry.     Findings: No erythema or rash.  Neurological:     General: No focal deficit present.     Mental Status: He is alert and oriented to person, place, and time.     Sensory: No sensory deficit.     Motor: No weakness or abnormal muscle tone.     Coordination: Coordination normal.     Gait: Gait normal.  Psychiatric:        Mood and Affect: Mood normal.        Behavior: Behavior normal.        Thought Content: Thought content normal.     Ortho Exam  Imaging: No results found.  Past Medical/Family/Surgical/Social History: Medications & Allergies reviewed per EMR, new medications updated. Patient Active Problem List   Diagnosis Date Noted  . Atherosclerotic peripheral vascular disease with ulceration ([place]) 04/03/2015  . Coronary artery disease involving native coronary artery of native heart without angina pectoris 03/28/2014  . Aneurysm of iliac artery ([place]) 03/18/2014  . Abdominal pain, unspecified site  03/18/2014  . Syncope in setting of nausea and vomiting 03/07/14 03/08/2014  . Non compliance with medical treatment 02/28/2014  . Dyslipidemia, goal LDL below 70 02/28/2014  . Bradycardia- beta blocker decreased 02/28/2014  . Chronic diastolic CHF (congestive heart failure) ([place]) 02/28/2014  . Type 2 diabetes mellitus with manifestations ([place]) 02/27/2014  . TIA (transient ischemic attack) 02/26/2014  . History of non-ST elevation myocardial infarction (NSTEMI) 02/26/2014  . Weakness-Left arm / Lef 12/06/2013  . Mesenteric artery stenosis ([place]) 12/06/2013  . 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[place]) 12/03/2012  . Hypertension 11/10/2011  . History of CVA in adulthood 11/10/2011  . Language barrier 11/10/2011   Past Medical History:  Diagnosis Date  . CAD (coronary artery disease)    a. LHC (02/26/14):  mLAD 20 (faint L>R collats), mCFX 50, pRCA 95 (plaque rupture) >> PCI:  Xience DES to pRCA (severe tortuosity of R innominate artery >> needs L radial or FA in future);  b. LHC (03/10/14):  CFX 90, oOM 70, pRCA stent patent >> PCI:  Xience DES to mCFX   . Carotid stenosis    a. Carotid US (02/27/14):  Bilateral ICA 1-39%  . Depression   . Diabetes mellitus   . 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[place])    a.  Mesenteric Artery Duplex (7/15):  pSMA chronic dissection with aneurysmal dilation of 1.29 cm (VVS);  b.  Chest CTA (02/26/14):  IMPRESSION:  1. No aortic dissection or other acute abnormality.  2. 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  3. Stable 17 mm ectasia of the left common iliac artery.  4. Atherosclerosis, including aortoiliac and coronary artery disease.   Marland Kitchen History of echocardiogram 09/2015   Echo 1/17: mod LVH, EF 55%, inf-lat HK, Gr 1 DD, mild LAE  . Hx of cardiovascular stress test    Lexiscan Myoview (2/16):  Inferior lateral defect c/w thinning vs small prior infarct, no ischemia, EF 49%, Low Risk  . Hx of echocardiogram    a.  Echocardiogram (02/27/14):  Mod focal basal  and mild concentric LVH, EF 50-55%, no RWMA, Gr 1 DD, mild TR, normal RVF  . Hyperlipidemia   . Hypertension   . Iliac artery aneurysm, left ([place])   . Myocardial infarction ([place])   . Stroke [facility]) 09/26/2009   Family History  Problem Relation Age of Onset  . Diabetes Father   . Hypertension Father   . Heart attack Father   . Stroke Neg Hx    Past Surgical History:  Procedure Laterality Date  . CARDIAC CATHETERIZATION    . CARDIAC CATHETERIZATION N/A 06/25/2015   Procedure: Left Heart Cath and Coronary Angiography;  Surgeon: Burnell Blanks, MD;  Location: [facility];  Service: Cardiovascular;  Laterality: N/A;  . COLONOSCOPY WITH PROPOFOL N/A 07/23/2015   Procedure: COLONOSCOPY WITH PROPOFOL;  Surgeon: [name], MD;  Location: [facility];  Service: Endoscopy;  Laterality: N/A;  . CORONARY ANGIOPLASTY    . LEFT HEART CATHETERIZATION WITH CORONARY ANGIOGRAM N/A 02/26/2014   Procedure: LEFT HEART CATHETERIZATION WITH CORONARY ANGIOGRAM;  Surgeon: Wellington Hampshire, MD;  Location: [place] [facility] LAB;  Service: Cardiovascular;  Laterality: N/A;  . LEFT HEART CATHETERIZATION WITH CORONARY ANGIOGRAM N/A 03/10/2014   Procedure: LEFT HEART CATHETERIZATION WITH CORONARY ANGIOGRAM;  Surgeon: Jettie Booze, MD;  Location: [facility] CATH LAB;  Service: Cardiovascular;  Laterality: N/A;   Social History   Occupational History  . Occupation: Retired  Tobacco Use  . Smoking status: Former Smoker    Packs/day: 0.25    Years: 38.00    Pack years: 9.50    Types: Cigarettes    Quit date: 2008    Years since quitting: 13.5  . Smokeless tobacco: Never Used  Vaping Use  . Vaping Use: Never used  Substance and Sexual Activity  . Alcohol use: No    Alcohol/week: 0.0 standard drinks  . Drug use: No  . Sexual activity: Yes

## 2020-04-20 NOTE — Procedures (Signed)
EMG & NCV Findings: Evaluation of the right median motor nerve showed prolonged distal onset latency (4.4 ms) and decreased conduction velocity (Elbow-Wrist, 47 m/s).  The right median (across palm) sensory nerve showed no response (Palm) and prolonged distal peak latency (3.7 ms).  The right ulnar sensory nerve showed prolonged distal peak latency (3.8 ms), reduced amplitude (13.2 V), and decreased conduction velocity (Wrist-5th Digit, 37 m/s).  All remaining nerves (as indicated in the following tables) were within normal limits.    All examined muscles (as indicated in the following table) showed no evidence of electrical instability.    Impression: The above electrodiagnostic study is ABNORMAL and reveals evidence of a moderate right median nerve entrapment at the wrist (carpal tunnel syndrome) affecting sensory and motor components.   There is no significant electrodiagnostic evidence of any other focal nerve entrapment, brachial plexopathy or cervical radiculopathy.   Recommendations: 1.  Follow-up with referring physician. 2.  Continue current management of symptoms. 3.  Continue use of resting splint at night-time and as needed during the day.  ___________________________ Barry [name] Board Certified, American Board of Physical Medicine and Rehabilitation    Nerve Conduction Studies Anti Sensory Summary Table   Stim Site NR Peak (ms) Norm Peak (ms) P-T Amp (V) Norm P-T Amp Site1 Site2 Delta-P (ms) Dist (cm) Vel (m/s) Norm Vel (m/s)  Right Median Acr Palm Anti Sensory (2nd Digit)  30.3C  Wrist    *3.7 <3.6 17.3 >10 Wrist Palm  0.0    Palm *NR  <2.0          Right Radial Anti Sensory (Base 1st Digit)  30C  Wrist    2.4 <3.1 12.5  Wrist Base 1st Digit 2.4 0.0    Right Ulnar Anti Sensory (5th Digit)  30.2C  Wrist    *3.8 <3.7 *13.2 >15.0 Wrist 5th Digit 3.8 14.0 *37 >38   Motor Summary Table   Stim Site NR Onset (ms) Norm Onset (ms) O-P Amp (mV) Norm O-P Amp Site1  Site2 Delta-0 (ms) Dist (cm) Vel (m/s) Norm Vel (m/s)  Right Median Motor (Abd Poll Brev)  30C  Wrist    *4.4 <4.2 8.0 >5 Elbow Wrist 4.4 20.5 *47 >50  Elbow    8.8  7.9         Right Ulnar Motor (Abd Dig Min)  30.1C  Wrist    3.7 <4.2 8.0 >3 B Elbow Wrist 3.8 22.0 58 >53  B Elbow    7.5  8.3  A Elbow B Elbow 1.8 10.0 56 >53  A Elbow    9.3  8.4          EMG   Side Muscle Nerve Root Ins Act Fibs Psw Amp Dur Poly Recrt Int Fraser Din Comment  Right 1stDorInt Ulnar C8-T1 Nml Nml Nml Nml Nml 0 Nml Nml   Right Abd Poll Brev Median C8-T1 Nml Nml Nml Nml Nml 0 Nml Nml   Right ExtDigCom   Nml Nml Nml Nml Nml 0 Nml Nml   Right Triceps Radial C6-7-8 Nml Nml Nml Nml Nml 0 Nml Nml   Right Deltoid Axillary C5-6 Nml Nml Nml Nml Nml 0 Nml Nml     Nerve Conduction Studies Anti Sensory Left/Right Comparison   Stim Site L Lat (ms) R Lat (ms) L-R Lat (ms) L Amp (V) R Amp (V) L-R Amp (%) Site1 Site2 L Vel (m/s) R Vel (m/s) L-R Vel (m/s)  Median Acr Palm Anti Sensory (2nd Digit)  30.3C  Wrist  *3.7   17.3  Wrist Palm     Palm             Radial Anti Sensory (Base 1st Digit)  30C  Wrist  2.4   12.5  Wrist Base 1st Digit     Ulnar Anti Sensory (5th Digit)  30.2C  Wrist  *3.8   *13.2  Wrist 5th Digit  *37    Motor Left/Right Comparison   Stim Site L Lat (ms) R Lat (ms) L-R Lat (ms) L Amp (mV) R Amp (mV) L-R Amp (%) Site1 Site2 L Vel (m/s) R Vel (m/s) L-R Vel (m/s)  Median Motor (Abd Poll Brev)  30C  Wrist  *4.4   8.0  Elbow Wrist  *47   Elbow  8.8   7.9        Ulnar Motor (Abd Dig Min)  30.1C  Wrist  3.7   8.0  B Elbow Wrist  58   B Elbow  7.5   8.3  A Elbow B Elbow  56   A Elbow  9.3   8.4           Waveforms:

## 2020-04-24 ENCOUNTER — Telehealth: Payer: Self-pay | Admitting: Cardiovascular Disease

## 2020-04-24 NOTE — Telephone Encounter (Signed)
Spoke with pt's son who states pt has been having intermittent chest pain with nausea and sweating x 1 week.  Pt has been taking expired nitro and son states he is not sure this has been helping.  Pt is currently having active CP per son.  [name] advised to call 911 now d/t pt's symptoms.  [name] verbalizes understanding and agrees with current plan.

## 2020-04-24 NOTE — Telephone Encounter (Signed)
Pt c/o of Chest Pain: STAT if CP now or developed within 24 hours  1. Are you having CP right now? Yes  2. Are you experiencing any other symptoms (ex. SOB, nausea, vomiting, sweating)? When getting up to walk, he will feel nauseous. Sweaty. No energy to do anything, very weak. The feeling in his chest is tight   3. How long have you been experiencing CP? For a week now.  4. Is your CP continuous or coming and going? Pain will come and feel it for a couple hours and then will go away and then it will happen again.   5. Have you taken Nitroglycerin? He took one. Son stated it was filled on 10/19/2016. ?

## 2020-05-01 ENCOUNTER — Other Ambulatory Visit: Payer: Self-pay

## 2020-05-01 ENCOUNTER — Encounter: Payer: Self-pay | Admitting: Orthopedic Surgery

## 2020-05-01 ENCOUNTER — Ambulatory Visit: Payer: Medicare Other | Admitting: Orthopedic Surgery

## 2020-05-01 DIAGNOSIS — M545 Low back pain, unspecified: Secondary | ICD-10-CM

## 2020-05-01 NOTE — Progress Notes (Signed)
Office Visit Note   Patient: Barry [name]           Date of Birth: [birth year]           MRN: [id number] Visit Date: 05/01/2020 Requested by: Barry Mclean, MD [address] [place],  [place] [address] PCP: Barry Mclean, MD  Subjective: Chief Complaint  Patient presents with  . Follow-up    EMG/NCV    HPI: Barry [name] is a 69 year old patient with multiple orthopedic complaints.  Comes in today to follow-up EMG nerve study which shows moderate carpal tunnel syndrome on the right-hand side.  In general this is something that is irritating but not debilitating.  He also reports some low back pain with no red flag symptoms.  Everything is done today through a translator.  He has not used a wrist splint yet.              ROS: All systems reviewed are negative as they relate to the chief complaint within the history of present illness.  Patient denies  fevers or chills.   Assessment & Plan: Visit Diagnoses:  1. Low back pain, unspecified back pain laterality, unspecified chronicity, unspecified whether sciatica present     Plan: Impression is low back pain.  Plan is therapy 1 time a week for 8 weeks.  Regarding the carpal tunnel syndrome he has moderate carpal tunnel syndrome.  We will try wrist splint.  Come back in 3 months to recheck right carpal tunnel syndrome and low back pain.  Decision at that time for or against MRI scan with possible injection into the back.  A lot of that we will have to do with how he is doing with his blood glucose management.  Recent A1c was very high.  Follow-up with Barry [name] on a Friday afternoon for repeat assessment of response to therapy with splint for carpal tunnel and therapy for low back pain.  Follow-Up Instructions: Return in about 3 months (around 08/01/2020).   Orders:  Orders Placed This Encounter  Procedures  . Ambulatory referral to Physical Therapy   No orders of the defined types were placed in this encounter.      Procedures: No procedures performed   Clinical Data: No additional findings.  Objective: Vital Signs: There were no vitals taken for this visit.  Physical Exam:   Constitutional: Patient appears well-developed HEENT:  Head: Normocephalic Eyes:EOM are normal Neck: Normal range of motion Cardiovascular: Normal rate Pulmonary/chest: Effort normal Neurologic: Patient is alert Skin: Skin is warm Psychiatric: Patient has normal mood and affect    Ortho Exam: Ortho exam demonstrates no nerve root tension signs.  Good motor or sensory strength in the legs with no paresthesias.  No muscle atrophy in the legs.  Gait is normal.  Examination of the right hand demonstrates no abductor pollicis brevis wasting with good wrist range of motion palpable radial pulse and some palmar paresthesias.  Negative Tinel's cubital tunnel at the elbow.  Neck range of motion intact.  Specialty Comments:  No specialty comments available.  Imaging: No results found.   PMFS History: Patient Active Problem List   Diagnosis Date Noted  . Atherosclerotic peripheral vascular disease with ulceration ([place]) 04/03/2015  . Coronary artery disease involving native coronary artery of native heart without angina pectoris 03/28/2014  . Aneurysm of iliac artery ([place]) 03/18/2014  . Abdominal pain, unspecified site 03/18/2014  . Syncope in setting of nausea and vomiting 03/07/14 03/08/2014  . Non compliance  with medical treatment 02/28/2014  . Dyslipidemia, goal LDL below 70 02/28/2014  . Bradycardia- beta blocker decreased 02/28/2014  . Chronic diastolic CHF (congestive heart failure) ([place]) 02/28/2014  . Type 2 diabetes mellitus with manifestations ([place]) 02/27/2014  . TIA (transient ischemic attack) 02/26/2014  . History of non-ST elevation myocardial infarction (NSTEMI) 02/26/2014  . Weakness-Left arm / Lef 12/06/2013  . Mesenteric artery stenosis ([place]) 12/06/2013  . 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[place])  12/03/2012  . Hypertension 11/10/2011  . History of CVA in adulthood 11/10/2011  . Language barrier 11/10/2011   Past Medical History:  Diagnosis Date  . CAD (coronary artery disease)    a. LHC (02/26/14):  mLAD 20 (faint L>R collats), mCFX 50, pRCA 95 (plaque rupture) >> PCI:  Xience DES to pRCA (severe tortuosity of R innominate artery >> needs L radial or FA in future);  b. LHC (03/10/14):  CFX 90, oOM 70, pRCA stent patent >> PCI:  Xience DES to mCFX   . Carotid stenosis    a. Carotid US (02/27/14):  Bilateral ICA 1-39%  . Depression   . Diabetes mellitus   . 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[place])    a. Mesenteric Artery Duplex (7/15):  pSMA chronic dissection with aneurysmal dilation of 1.29 cm (VVS);  b.  Chest CTA (02/26/14):  IMPRESSION:  1. No aortic dissection or other acute abnormality.  2. 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  3. Stable 17 mm ectasia of the left common iliac artery.  4. Atherosclerosis, including aortoiliac and coronary artery disease.   Marland Kitchen History of echocardiogram 09/2015   Echo 1/17: mod LVH, EF 55%, inf-lat HK, Gr 1 DD, mild LAE  . Hx of cardiovascular stress test    Lexiscan Myoview (2/16):  Inferior lateral defect c/w thinning vs small prior infarct, no ischemia, EF 49%, Low Risk  . Hx of echocardiogram    a.  Echocardiogram (02/27/14):  Mod focal basal and mild concentric LVH, EF 50-55%, no RWMA, Gr 1 DD, mild TR, normal RVF  . Hyperlipidemia   . Hypertension   . Iliac artery aneurysm, left ([place])   . Myocardial infarction ([place])   . Stroke [facility]) 09/26/2009    Family History  Problem Relation Age of Onset  . Diabetes Father   . Hypertension Father   . Heart attack Father   . Stroke Neg Hx     Past Surgical History:  Procedure Laterality Date  . CARDIAC CATHETERIZATION    . CARDIAC CATHETERIZATION N/A 06/25/2015   Procedure: Left Heart Cath and Coronary Angiography;  Surgeon: Burnell Blanks, MD;  Location: [place] [facility];   Service: Cardiovascular;  Laterality: N/A;  . COLONOSCOPY WITH PROPOFOL N/A 07/23/2015   Procedure: COLONOSCOPY WITH PROPOFOL;  Surgeon: [name], MD;  Location: [facility];  Service: Endoscopy;  Laterality: N/A;  . CORONARY ANGIOPLASTY    . LEFT HEART CATHETERIZATION WITH CORONARY ANGIOGRAM N/A 02/26/2014   Procedure: LEFT HEART CATHETERIZATION WITH CORONARY ANGIOGRAM;  Surgeon: Wellington Hampshire, MD;  Location: [place] CATH LAB;  Service: Cardiovascular;  Laterality: N/A;  . LEFT HEART CATHETERIZATION WITH CORONARY ANGIOGRAM N/A 03/10/2014   Procedure: LEFT HEART CATHETERIZATION WITH CORONARY ANGIOGRAM;  Surgeon: Jettie Booze, MD;  Location: [facility] CATH LAB;  Service: Cardiovascular;  Laterality: N/A;   Social History   Occupational History  . Occupation: Retired  Tobacco Use  . Smoking status: Former Smoker    Packs/day: 0.25    Years: 38.00    Pack  years: 9.50    Types: Cigarettes    Quit date: 2008    Years since quitting: 13.6  . Smokeless tobacco: Never Used  Vaping Use  . Vaping Use: Never used  Substance and Sexual Activity  . Alcohol use: No    Alcohol/week: 0.0 standard drinks  . Drug use: No  . Sexual activity: Yes

## 2020-05-03 NOTE — Progress Notes (Signed)
[facility] [place] [address], [address] [place], [place] [address] [phone] Fax [phone]  Date:  05/04/2020   Name:  Barry [name]   DOB:  06/07/1951   MRN:  [id number]  PCP:  Copland, Jessica C, MD    Chief Complaint: Chest Pain (burning in chest, 2 weeks, no energy, shortness of breath)   History of Present Illness:  Barry [name] is a 69 y.o. very pleasant male patient who presents with the following:  Pt interviewed with aid of video interpreter  History of CAD status post NSTEMI and stenting x2, chronic superior mesenteric artery dissection followed by vascular surgery, PAD, stroke in 2011, diabetes, hypertension, hyperlipidemia, CKD Last seen by myself in April of this year  I referred him to ortho for his shoulder pain at that time - he is seeing Scott Dean with orthopedics for this issue covid series- yes- done!    Lab Results  Component Value Date   HGBA1C 12.1 (H) 01/06/2020   Last A1c showed poor control of his DM.  I asked him to follow-up with endocrinology at that time.  Ono far has not done [place] He notes difficulty hearing, ringing in his ears, low energy  Noted that the reason for visit is listed as CP- asked about this as well   Pt states he has chest tightness and pain which has been present for 2 weeks He feels like his energy is low Chest pain is not worse with exercise. He feels like it is here all the time  Feels like a burning or weight I see phone call on chart from 7/30- pt's son called with concern of his having CP for one week.  He was told to go to ER but did not go as he felt better   He also notes a cough and dry throat He has been hoarse recently   He is seeing cardiology- last visit was in May with pharmacy for lipids Last cardiology clinic visit in March-  Scott Weaver: 1. Essential hypertension Blood pressure remains above goal.  Goal is <130/80.  Continue current dose of lisinopril.  Start amlodipine 5 mg  daily.  2. Mixed hyperlipidemia Continue high intensity statin with rosuvastatin 40 mg daily in addition to PCSK9 inhibitor with Alirocumab 75 mg every 14 days.  Follow-up fasting lipids and LFTs will be obtained in June 2021.  3. Coronary artery disease involving native coronary artery of native heart without angina pectoris S/p NSTEMI June 2015 treated with drug-eluting stent to the proximal RCA and subsequent drug-eluting stent to the LCx. Myoview in 2016 was low risk. Cardiac catheterization in 2016 demonstrated patent stents in the RCA and LCx. The second obtuse marginal was jailed by the stent and there was moderate stenosis in the ostial PDA. Residual disease has been managed medically.  He is doing well without anginal symptoms.  Continue rosuvastatin, Alirocumab, aspirin Today pt states that he does not have diabetes.  We explained that his high blood sugar is = diabetes in his case     Patient Active Problem List   Diagnosis Date Noted  . Atherosclerotic peripheral vascular disease with ulceration (HCC) 04/03/2015  . Coronary artery disease involving native coronary artery of native heart without angina pectoris 03/28/2014  . Aneurysm of iliac artery (HCC) 03/18/2014  . Abdominal pain, unspecified site 03/18/2014  . Syncope in setting of nausea and vomiting 03/07/14 03/08/2014  . Non compliance with medical treatment 02/28/2014  .   Dyslipidemia, goal LDL below 70 02/28/2014  . Bradycardia- beta blocker decreased 02/28/2014  . Chronic diastolic CHF (congestive heart failure) ([place]) 02/28/2014  . Type 2 diabetes mellitus with manifestations ([place]) 02/27/2014  . TIA (transient ischemic attack) 02/26/2014  . History of non-ST elevation myocardial infarction (NSTEMI) 02/26/2014  . Weakness-Left arm / Lef 12/06/2013  . Mesenteric artery stenosis ([place]) 12/06/2013  . 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[place]) 12/03/2012  . Hypertension 11/10/2011  . History of CVA in adulthood 11/10/2011  .  Language barrier 11/10/2011    Past Medical History:  Diagnosis Date  . CAD (coronary artery disease)    a. LHC (02/26/14):  mLAD 20 (faint L>R collats), mCFX 50, pRCA 95 (plaque rupture) >> PCI:  Xience DES to pRCA (severe tortuosity of R innominate artery >> needs L radial or FA in future);  b. LHC (03/10/14):  CFX 90, oOM 70, pRCA stent patent >> PCI:  Xience DES to mCFX   . Carotid stenosis    a. Carotid US (02/27/14):  Bilateral ICA 1-39%  . Depression   . Diabetes mellitus   . 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[place])    a. Mesenteric Artery Duplex (7/15):  pSMA chronic dissection with aneurysmal dilation of 1.29 cm (VVS);  b.  Chest CTA (02/26/14):  IMPRESSION:  1. No aortic dissection or other acute abnormality.  2. 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  3. Stable 17 mm ectasia of the left common iliac artery.  4. Atherosclerosis, including aortoiliac and coronary artery disease.   Marland Kitchen History of echocardiogram 09/2015   Echo 1/17: mod LVH, EF 55%, inf-lat HK, Gr 1 DD, mild LAE  . Hx of cardiovascular stress test    Lexiscan Myoview (2/16):  Inferior lateral defect c/w thinning vs small prior infarct, no ischemia, EF 49%, Low Risk  . Hx of echocardiogram    a.  Echocardiogram (02/27/14):  Mod focal basal and mild concentric LVH, EF 50-55%, no RWMA, Gr 1 DD, mild TR, normal RVF  . Hyperlipidemia   . Hypertension   . Iliac artery aneurysm, left ([place])   . Myocardial infarction ([place])   . Stroke [facility]) 09/26/2009    Past Surgical History:  Procedure Laterality Date  . CARDIAC CATHETERIZATION    . CARDIAC CATHETERIZATION N/A 06/25/2015   Procedure: Left Heart Cath and Coronary Angiography;  Surgeon: Burnell Blanks, MD;  Location: [place] CV LAB;  Service: Cardiovascular;  Laterality: N/A;  . COLONOSCOPY WITH PROPOFOL N/A 07/23/2015   Procedure: COLONOSCOPY WITH PROPOFOL;  Surgeon: [name], MD;  Location: [facility];  Service: Endoscopy;  Laterality: N/A;  .  CORONARY ANGIOPLASTY    . LEFT HEART CATHETERIZATION WITH CORONARY ANGIOGRAM N/A 02/26/2014   Procedure: LEFT HEART CATHETERIZATION WITH CORONARY ANGIOGRAM;  Surgeon: Wellington Hampshire, MD;  Location: [place] CATH LAB;  Service: Cardiovascular;  Laterality: N/A;  . LEFT HEART CATHETERIZATION WITH CORONARY ANGIOGRAM N/A 03/10/2014   Procedure: LEFT HEART CATHETERIZATION WITH CORONARY ANGIOGRAM;  Surgeon: Jettie Booze, MD;  Location: [facility] CATH LAB;  Service: Cardiovascular;  Laterality: N/A;    Social History   Tobacco Use  . Smoking status: Former Smoker    Packs/day: 0.25    Years: 38.00    Pack years: 9.50    Types: Cigarettes    Quit date: 2008    Years since quitting: 13.6  . Smokeless tobacco: Never Used  Vaping Use  . Vaping Use: Never used  Substance Use Topics  . Alcohol use: No  Alcohol/week: 0.0 standard drinks  . Drug use: No    Family History  Problem Relation Age of Onset  . Diabetes Father   . Hypertension Father   . Heart attack Father   . Stroke Neg Hx     Allergies  Allergen Reactions  . Metformin And Related     Pt feels that this causes constipation and will not take     Medication list has been reviewed and updated.  Current Outpatient Medications on File Prior to Visit  Medication Sig Dispense Refill  . amLODipine (NORVASC) 5 MG tablet Take 1 tablet (5 mg total) by mouth daily. 30 tablet 11  . aspirin EC 81 MG EC tablet Take 1 tablet (81 mg total) by mouth daily.    . blood glucose meter kit and supplies Dispense based on patient and insurance preference. Use up to four times daily as directed. (FOR ICD-10 E10.9, E11.9). 1 each 0  . Blood Glucose Monitoring Suppl w/Device KIT 1 kit by Does not apply route as directed. 1 kit 0  . ezetimibe (ZETIA) 10 MG tablet Take 1 tablet (10 mg total) by mouth daily. 90 tablet 3  . gabapentin (NEURONTIN) 300 MG capsule Take 1 capsule (300 mg total) by mouth at bedtime. 90 capsule 3  . glipiZIDE (GLUCOTROL) 5 MG  tablet Take 1 tablet (5 mg total) by mouth daily before breakfast. 30 tablet 0  . glucose blood (ONE TOUCH ULTRA TEST) test strip 1 each by Other route daily. And lancets 1/day 100 each 3  . lisinopril (ZESTRIL) 20 MG tablet Take 0.5 tablets (10 mg total) by mouth daily. 15 tablet 11  . TECHLITE PEN NEEDLES 32G X 8 MM MISC USE ONE PEN NEEDLE TO CHECK BLOOD GLUCOSE ONCE DAILY 100 each 3  . valACYclovir (VALTREX) 1000 MG tablet Take 1 tablet (1,000 mg total) by mouth 3 (three) times daily. 21 tablet 0  . rosuvastatin (CRESTOR) 40 MG tablet Take 1 tablet (40 mg total) by mouth daily. 90 tablet 3   No current facility-administered medications on file prior to visit.    Review of Systems:  As per HPI- otherwise negative.   Physical Examination: Vitals:   05/04/20 1506  BP: 110/60  Pulse: 73  Resp: 18  SpO2: 98%   Vitals:   05/04/20 1506  Weight: 159 lb (72.1 kg)  Height: 5' 5" (1.651 m)   Body mass index is 26.46 kg/m. Ideal Body Weight: Weight in (lb) to have BMI = 25: 149.9  GEN: no acute distress.  Minimal overweight, looks well and his normal self Many interesting home tattoos from [place] HEENT: Atraumatic, Normocephalic.  Bilateral TM wnl, oropharynx normal.  PEERL,EOMI.   Ears and Nose: No external deformity. CV: RRR, No M/G/R. No JVD. No thrill. No extra heart sounds. PULM: CTA B, no wheezes, crackles, rhonchi. No retractions. No resp. distress. No accessory muscle use. ABD: S, NT, ND. No rebound. No HSM. EXTR: No c/c/e PSYCH: Normally interactive. Conversant.  Patient indicates the left/substernal anterior chest at the site of his pain I am not able to reproduce pain by pressing on chest wall  EKG: NSR Assessment and Plan: Chest pain, unspecified type - Plan: CANCELED: Hemoglobin A1c  Type 2 diabetes mellitus with complication, without long-term current use of insulin (HCC) - Plan: EKG 12-Lead  Otalgia of both ears - Plan: Ambulatory referral to ENT  Hoarse  voice quality - Plan: Ambulatory referral to ENT  Pt seen today with concern of chest   pain for about 2 weeks.  He describes the pain as a burning or pressure.  It is present mostly in the left and middle of his chest.  I am not able to reproduce pain palpation.  EKG is normal Discussed with patient and his wife.  Given active chest pain with multiple risk factors he needs further evaluation/ enzymes.  They are okay with going to the ER for further evaluation.  I have touch base with the EDP and appreciate their care of this patient. Unfortunately we will have to leave his further diabetes evaluation to another day as he is going to the ER.  If the ER happened to draw an A1c I would be glad to look at it Referral to ENT for concern of ear pain and hoarse voice This visit occurred during the SARS-CoV-2 public health emergency.  Safety protocols were in place, including screening questions prior to the visit, additional usage of staff PPE, and extensive cleaning of exam room while observing appropriate contact time as indicated for disinfecting solutions.    Signed Lamar Blinks, MD

## 2020-05-04 ENCOUNTER — Other Ambulatory Visit: Payer: Self-pay

## 2020-05-04 ENCOUNTER — Emergency Department (HOSPITAL_BASED_OUTPATIENT_CLINIC_OR_DEPARTMENT_OTHER): Payer: Medicare Other

## 2020-05-04 ENCOUNTER — Emergency Department (HOSPITAL_BASED_OUTPATIENT_CLINIC_OR_DEPARTMENT_OTHER)
Admission: EM | Admit: 2020-05-04 | Discharge: 2020-05-04 | Disposition: A | Discharge status: Home / Self Care | Payer: Medicare Other | Attending: Emergency Medicine | Admitting: Emergency Medicine

## 2020-05-04 ENCOUNTER — Encounter (HOSPITAL_BASED_OUTPATIENT_CLINIC_OR_DEPARTMENT_OTHER): Payer: Self-pay | Admitting: *Deleted

## 2020-05-04 ENCOUNTER — Ambulatory Visit (INDEPENDENT_AMBULATORY_CARE_PROVIDER_SITE_OTHER): Payer: Medicare Other | Admitting: Family Medicine

## 2020-05-04 ENCOUNTER — Encounter: Payer: Self-pay | Admitting: Family Medicine

## 2020-05-04 VITALS — BP 110/60 | HR 73 | Resp 18 | Ht 65.0 in | Wt 159.0 lb

## 2020-05-04 DIAGNOSIS — Z5321 Procedure and treatment not carried out due to patient leaving prior to being seen by health care provider: Secondary | ICD-10-CM | POA: Insufficient documentation

## 2020-05-04 DIAGNOSIS — R490 Dysphonia: Secondary | ICD-10-CM | POA: Diagnosis not present

## 2020-05-04 DIAGNOSIS — R079 Chest pain, unspecified: Secondary | ICD-10-CM

## 2020-05-04 DIAGNOSIS — H9203 Otalgia, bilateral: Secondary | ICD-10-CM

## 2020-05-04 DIAGNOSIS — E118 Type 2 diabetes mellitus with unspecified complications: Secondary | ICD-10-CM

## 2020-05-04 DIAGNOSIS — R21 Rash and other nonspecific skin eruption: Secondary | ICD-10-CM | POA: Diagnosis not present

## 2020-05-04 LAB — BASIC METABOLIC PANEL
Anion gap: 10 (ref 5–15)
BUN: 21 mg/dL (ref 8–23)
CO2: 28 mmol/L (ref 22–32)
Calcium: 9.3 mg/dL (ref 8.9–10.3)
Chloride: 94 mmol/L — ABNORMAL LOW (ref 98–111)
Creatinine, Ser: 1.34 mg/dL — ABNORMAL HIGH (ref 0.61–1.24)
GFR calc Af Amer: >60 mL/min (ref >60)
GFR calc non Af Amer: 53 mL/min — ABNORMAL LOW (ref >60)
Glucose, Bld: 428 mg/dL — ABNORMAL HIGH (ref 70–99)
Potassium: 4.3 mmol/L (ref 3.5–5.1)
Sodium: 132 mmol/L — ABNORMAL LOW (ref 135–145)

## 2020-05-04 LAB — CBC
HCT: 43.7 % (ref 39.0–52.0)
Hemoglobin: 13.9 g/dL (ref 13.0–17.0)
MCH: 28.3 pg (ref 26.0–34.0)
MCHC: 31.8 g/dL (ref 30.0–36.0)
MCV: 89 fL (ref 80.0–100.0)
Platelets: 133 10*3/uL — ABNORMAL LOW (ref 150–400)
RBC: 4.91 MIL/uL (ref 4.22–5.81)
RDW: 12.2 % (ref 11.5–15.5)
WBC: 7.5 10*3/uL (ref 4.0–10.5)
nRBC: 0 % (ref 0.0–0.2)

## 2020-05-04 LAB — TROPONIN I (HIGH SENSITIVITY): Troponin I (High Sensitivity): 10 ng/L (ref <18)

## 2020-05-04 MED ORDER — SODIUM CHLORIDE 0.9% FLUSH
3.0000 mL | Freq: Once | INTRAVENOUS | Status: DC
  Filled 2020-05-04: qty 3

## 2020-05-04 NOTE — ED Notes (Signed)
PT stated they do not want to wait and PTs armband removed. PT told updated on status and wait times and encouraged to stay by this tech

## 2020-05-04 NOTE — ED Triage Notes (Signed)
Chest pain x 2 weeks 

## 2020-05-11 ENCOUNTER — Other Ambulatory Visit: Payer: Self-pay

## 2020-05-11 ENCOUNTER — Other Ambulatory Visit: Payer: Medicare Other | Admitting: *Deleted

## 2020-05-11 DIAGNOSIS — E782 Mixed hyperlipidemia: Secondary | ICD-10-CM | POA: Diagnosis not present

## 2020-05-11 DIAGNOSIS — I2510 Atherosclerotic heart disease of native coronary artery without angina pectoris: Secondary | ICD-10-CM | POA: Diagnosis not present

## 2020-05-11 DIAGNOSIS — E785 Hyperlipidemia, unspecified: Secondary | ICD-10-CM | POA: Diagnosis not present

## 2020-05-11 LAB — LIPID PANEL
Chol/HDL Ratio: 2.7 ratio (ref 0.0–5.0)
Cholesterol, Total: 92 mg/dL — ABNORMAL LOW (ref 100–199)
HDL: 34 mg/dL — ABNORMAL LOW (ref >39)
LDL Chol Calc (NIH): 35 mg/dL (ref 0–99)
Triglycerides: 130 mg/dL (ref 0–149)
VLDL Cholesterol Cal: 23 mg/dL (ref 5–40)

## 2020-05-11 LAB — HEPATIC FUNCTION PANEL
ALT: 35 IU/L (ref 0–44)
AST: 22 IU/L (ref 0–40)
Albumin: 4.1 g/dL (ref 3.8–4.8)
Alkaline Phosphatase: 66 IU/L (ref 48–121)
Bilirubin Total: 0.4 mg/dL (ref 0.0–1.2)
Bilirubin, Direct: 0.13 mg/dL (ref 0.00–0.40)
Total Protein: 6.5 g/dL (ref 6.0–8.5)

## 2020-05-13 ENCOUNTER — Ambulatory Visit (INDEPENDENT_AMBULATORY_CARE_PROVIDER_SITE_OTHER): Payer: Medicare Other | Admitting: Endocrinology

## 2020-05-13 ENCOUNTER — Other Ambulatory Visit: Payer: Self-pay

## 2020-05-13 ENCOUNTER — Encounter: Payer: Self-pay | Admitting: Endocrinology

## 2020-05-13 VITALS — BP 110/60 | HR 75 | Ht 65.0 in | Wt 159.6 lb

## 2020-05-13 DIAGNOSIS — E118 Type 2 diabetes mellitus with unspecified complications: Secondary | ICD-10-CM | POA: Diagnosis not present

## 2020-05-13 LAB — POCT GLYCOSYLATED HEMOGLOBIN (HGB A1C): Hemoglobin A1C: 10.1 % — AB (ref 4.0–5.6)

## 2020-05-13 MED ORDER — LANTUS SOLOSTAR 100 UNIT/ML ~~LOC~~ SOPN
10.0000 [IU] | PEN_INJECTOR | SUBCUTANEOUS | 99 refills | Status: DC
Start: 2020-05-13 — End: 2020-05-18

## 2020-05-13 NOTE — Patient Instructions (Addendum)
Please continue the same glipizide, and: I have sent a prescription to your pharmacy, to add "Lantus." check your blood sugar twice a day.  vary the time of day when you check, between before the 3 meals, and at bedtime.  also check if you have symptoms of your blood sugar being too high or too low.  please keep a record of the readings and bring it to your next appointment here (or you can bring the meter itself).  You can write it on any piece of paper.  please call us sooner if your blood sugar goes below 70, or if you have a lot of readings over 200. Please come back for a follow-up appointment in 6 weeks.    ??????????????????? ???? ????? ??? ???? ????? ????? ??? ?? ??? ???? ???? ?????? ?????? " Lantus ? " ??????? ??? ???? ??? ???? ???? ??? ?? ????? ??? ???? ?  ?. ?????????????????????????????????????????, ???????????? 3 ??????????  ??? ??????? ??? ?? ??? ?? ?? ???? ??? ??? ????? ?? ???? ??? ???? ???? ????? ??? ? ??? ??? ?   ??? ????? ????????? ?? ??? ??? ??? ?? ?? ?? ???? ??? ??????? ???? ???? ?? ????? (? ???? ??? ?? ?????? ????? ?? ??) ?  ???? ??? ????? ?? ?? ?? ?????? ?? ??? ?  ??? ???????? ?? ??? ???? ?? ???? ?????? ?? ???? ??? ???? ???? ?????? ??? ????? ?? ? ?? ???? ??? ??? ??? ????? ??? ???? ??? ?????? ?? ??? ??????? ??? ???? ??? ??? ??? ????? ??? ??? 6 ??????? ?

## 2020-05-13 NOTE — Progress Notes (Signed)
Subjective:    Patient ID: Barry [name], male    DOB: 07/12/1951, 69 y.o.   MRN: [id number]  HPI  Video interpreter Pt returns for f/u of diabetes mellitus:  DM type: 2 Dx'ed: 4270 Complications: PN, stage 3 CRI, PAD, and CAD Therapy: glipizide DKA: never Severe hypoglycemia: never Pancreatitis: never Pancreatic imaging: normal on 2019 CT.  Other: he did not tolerate metformin or victoza (abd bloating and diarrhea); he took Levemir 2016-2018 (he stopped due to edema and abd bloating).   Interval history: pt states he feels well in general.  He takes glipizide as rx'ed. He brings a record of his cbg's which I have reviewed today.  cbg's vary from 172-370.     Past Medical History:  Diagnosis Date  . CAD (coronary artery disease)    a. LHC (02/26/14):  mLAD 20 (faint L>R collats), mCFX 50, pRCA 95 (plaque rupture) >> PCI:  Xience DES to pRCA (severe tortuosity of R innominate artery >> needs L radial or FA in future);  b. LHC (03/10/14):  CFX 90, oOM 70, pRCA stent patent >> PCI:  Xience DES to mCFX   . Carotid stenosis    a. Carotid US (02/27/14):  Bilateral ICA 1-39%  . Depression   . Diabetes mellitus   . 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[place])    a. Mesenteric Artery Duplex (7/15):  pSMA chronic dissection with aneurysmal dilation of 1.29 cm (VVS);  b.  Chest CTA (02/26/14):  IMPRESSION:  1. No aortic dissection or other acute abnormality.  2. 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  3. Stable 17 mm ectasia of the left common iliac artery.  4. Atherosclerosis, including aortoiliac and coronary artery disease.   Marland Kitchen History of echocardiogram 09/2015   Echo 1/17: mod LVH, EF 55%, inf-lat HK, Gr 1 DD, mild LAE  . Hx of cardiovascular stress test    Lexiscan Myoview (2/16):  Inferior lateral defect c/w thinning vs small prior infarct, no ischemia, EF 49%, Low Risk  . Hx of echocardiogram    a.  Echocardiogram (02/27/14):  Mod focal basal and mild concentric LVH, EF 50-55%, no RWMA, Gr 1  DD, mild TR, normal RVF  . Hyperlipidemia   . Hypertension   . Iliac artery aneurysm, left ([place])   . Myocardial infarction ([place])   . Stroke [facility]) 09/26/2009    Past Surgical History:  Procedure Laterality Date  . CARDIAC CATHETERIZATION    . CARDIAC CATHETERIZATION N/A 06/25/2015   Procedure: Left Heart Cath and Coronary Angiography;  Surgeon: Burnell Blanks, MD;  Location: [place] CV LAB;  Service: Cardiovascular;  Laterality: N/A;  . COLONOSCOPY WITH PROPOFOL N/A 07/23/2015   Procedure: COLONOSCOPY WITH PROPOFOL;  Surgeon: [name], MD;  Location: [facility];  Service: Endoscopy;  Laterality: N/A;  . CORONARY ANGIOPLASTY    . LEFT HEART CATHETERIZATION WITH CORONARY ANGIOGRAM N/A 02/26/2014   Procedure: LEFT HEART CATHETERIZATION WITH CORONARY ANGIOGRAM;  Surgeon: Wellington Hampshire, MD;  Location: [place] CATH LAB;  Service: Cardiovascular;  Laterality: N/A;  . LEFT HEART CATHETERIZATION WITH CORONARY ANGIOGRAM N/A 03/10/2014   Procedure: LEFT HEART CATHETERIZATION WITH CORONARY ANGIOGRAM;  Surgeon: Jettie Booze, MD;  Location: [facility] CATH LAB;  Service: Cardiovascular;  Laterality: N/A;    Social History   Socioeconomic History  . Marital status: Married    Spouse name: Not on file  . Number of children: 3  . Years of education: Not on file  . Highest education level:  Not on file  Occupational History  . Occupation: Retired  Tobacco Use  . Smoking status: Former Smoker    Packs/day: 0.25    Years: 38.00    Pack years: 9.50    Types: Cigarettes    Quit date: 2008    Years since quitting: 13.6  . Smokeless tobacco: Never Used  Vaping Use  . Vaping Use: Never used  Substance and Sexual Activity  . Alcohol use: No    Alcohol/week: 0.0 standard drinks  . Drug use: No  . Sexual activity: Yes  Other Topics Concern  . Not on file  Social History Narrative   Marital: married.      Lives: with son,wife.       Children: 3 children; 6 grandchildren       Employed: unemployed; disability unknown reason/CVA L sided weakness      Tobacco:  Quit 2012; smoked 40 years.       Alcohol: no drinking now; social in past.       Drugs: none       Exercise: sporadic.       ADLs:  No driving since CVA.   Social Determinants of Health   Financial Resource Strain:   . Difficulty of Paying Living Expenses: Not on file  Food Insecurity:   . Worried About Charity fundraiser in the Last Year: Not on file  . Ran Out of Food in the Last Year: Not on file  Transportation Needs:   . Lack of Transportation (Medical): Not on file  . Lack of Transportation (Non-Medical): Not on file  Physical Activity:   . Days of Exercise per Week: Not on file  . Minutes of Exercise per Session: Not on file  Stress:   . Feeling of Stress : Not on file  Social Connections:   . Frequency of Communication with Friends and Family: Not on file  . Frequency of Social Gatherings with Friends and Family: Not on file  . Attends Religious Services: Not on file  . Active Member of Clubs or Organizations: Not on file  . Attends Archivist Meetings: Not on file  . Marital Status: Not on file  Intimate Partner Violence:   . Fear of Current or Ex-Partner: Not on file  . Emotionally Abused: Not on file  . Physically Abused: Not on file  . Sexually Abused: Not on file    Current Outpatient Medications on File Prior to Visit  Medication Sig Dispense Refill  . amLODipine (NORVASC) 5 MG tablet Take 1 tablet (5 mg total) by mouth daily. 30 tablet 11  . aspirin EC 81 MG EC tablet Take 1 tablet (81 mg total) by mouth daily.    . blood glucose meter kit and supplies Dispense based on patient and insurance preference. Use up to four times daily as directed. (FOR ICD-10 E10.9, E11.9). 1 each 0  . Blood Glucose Monitoring Suppl w/Device KIT 1 kit by Does not apply route as directed. 1 kit 0  . ezetimibe (ZETIA) 10 MG tablet Take 1 tablet (10 mg total) by mouth daily. 90 tablet 3  .  gabapentin (NEURONTIN) 300 MG capsule Take 1 capsule (300 mg total) by mouth at bedtime. 90 capsule 3  . glipiZIDE (GLUCOTROL) 5 MG tablet Take 1 tablet (5 mg total) by mouth daily before breakfast. 30 tablet 0  . glucose blood (ONE TOUCH ULTRA TEST) test strip 1 each by Other route daily. And lancets 1/day 100 each 3  .  lisinopril (ZESTRIL) 20 MG tablet Take 0.5 tablets (10 mg total) by mouth daily. 15 tablet 11  . TECHLITE PEN NEEDLES 32G X 8 MM MISC USE ONE PEN NEEDLE TO CHECK BLOOD GLUCOSE ONCE DAILY 100 each 3  . valACYclovir (VALTREX) 1000 MG tablet Take 1 tablet (1,000 mg total) by mouth 3 (three) times daily. 21 tablet 0  . rosuvastatin (CRESTOR) 40 MG tablet Take 1 tablet (40 mg total) by mouth daily. 90 tablet 3   No current facility-administered medications on file prior to visit.    Allergies  Allergen Reactions  . Metformin And Related     Pt feels that this causes constipation and will not take     Family History  Problem Relation Age of Onset  . Diabetes Father   . Hypertension Father   . Heart attack Father   . Stroke Neg Hx     BP 110/60   Pulse 75   Ht _0  (1.651 m)   Wt 159 lb 9.6 oz (72.4 kg)   SpO2 97%   BMI 26.56 kg/m    Review of Systems He denies hypoglycemia.      Objective:   Physical Exam VITAL SIGNS:  See vs page GENERAL: no distress Pulses: dorsalis pedis intact bilat.   MSK: no deformity of the feet CV: no leg edema Skin:  no ulcer on the feet.  normal color and temp on the feet. Neuro: sensation is intact to touch on the feet, but decreased from normal  Lab Results  Component Value Date   HGBA1C 10.1 (A) 05/13/2020   Lab Results  Component Value Date   CREATININE 1.34 (H) 05/04/2020   BUN 21 05/04/2020   NA 132 (L) 05/04/2020   K 4.3 05/04/2020   CL 94 (L) 05/04/2020   CO2 28 05/04/2020       Assessment & Plan:  Type 2 DM, with stage 3 CRI: uncontrolled.  We discussed.  He agrees to take insulin.  Patient Instructions    Please continue the same glipizide, and: I have sent a prescription to your pharmacy, to add "Lantus." check your blood sugar twice a day.  vary the time of day when you check, between before the 3 meals, and at bedtime.  also check if you have symptoms of your blood sugar being too high or too low.  please keep a record of the readings and bring it to your next appointment here (or you can bring the meter itself).  You can write it on any piece of paper.  please call us sooner if your blood sugar goes below 70, or if you have a lot of readings over 200. Please come back for a follow-up appointment in 6 weeks.    ??????????????????? ???? ????? ??? ???? ????? ????? ??? ?? ??? ???? ???? ?????? ?????? " Lantus ? " ??????? ??? ???? ??? ???? ???? ??? ?? ????? ??? ???? ?  ?. ?????????????????????????????????????????, ???????????? 3 ??????????  ??? ??????? ??? ?? ??? ?? ?? ???? ??? ??? ????? ?? ???? ??? ???? ???? ????? ??? ? ??? ??? ?   ??? ????? ????????? ?? ??? ??? ??? ?? ?? ?? ???? ??? ??????? ???? ???? ?? ????? (? ???? ??? ?? ?????? ????? ?? ??) ?  ???? ??? ????? ?? ?? ?? ?????? ?? ??? ?  ??? ???????? ?? ??? ???? ?? ???? ?????? ?? ???? ??? ???? ???? ?????? ??? ????? ?? ? ?? ???? ??? ??? ??? ????? ??? ???? ??? ?????? ?? ??? ??????? ??? ???? ??? ??? ??? ????? ??? ???  6 ??????? ?

## 2020-05-15 ENCOUNTER — Other Ambulatory Visit: Payer: Self-pay

## 2020-05-15 ENCOUNTER — Ambulatory Visit: Payer: Medicare Other | Attending: Orthopedic Surgery | Admitting: Physical Therapy

## 2020-05-15 ENCOUNTER — Encounter: Payer: Self-pay | Admitting: Physical Therapy

## 2020-05-15 DIAGNOSIS — R2689 Other abnormalities of gait and mobility: Secondary | ICD-10-CM | POA: Insufficient documentation

## 2020-05-15 DIAGNOSIS — M5441 Lumbago with sciatica, right side: Secondary | ICD-10-CM | POA: Insufficient documentation

## 2020-05-15 DIAGNOSIS — M6281 Muscle weakness (generalized): Secondary | ICD-10-CM | POA: Diagnosis not present

## 2020-05-15 DIAGNOSIS — G8929 Other chronic pain: Secondary | ICD-10-CM | POA: Diagnosis not present

## 2020-05-15 NOTE — Patient Instructions (Signed)
Access Code: [email or web address] URL: [email or web address] Date: 05/15/2020 Prepared by: Hilda Blades  Exercises Seated Hamstring Stretch - 1-2 x daily - 3 reps - 30 seconds hold Seated Piriformis Stretch - 1-2 x daily - 3 reps - 30 seconds hold Hooklying Single Knee to Chest Stretch - 1-2 x daily - 3 reps - 30 seconds hold Supine Bridge - 1-2 x daily - 2 sets - 10 reps Hooklying Clamshell with Resistance - 1-2 x daily - 2 sets - 10 reps Supine Active Straight Leg Raise - 1-2 x daily - 2 sets - 10 reps

## 2020-05-15 NOTE — Therapy (Signed)
[place], [place], [address] Phone: [phone]   Fax:  [phone]  Physical Therapy Evaluation  Patient Details  Name: Barry [name] MRN: [id number] Date of Birth: 04/27/1951 Referring Provider (PT): Marlou Sa Tonna Corner, MD   Encounter Date: 05/15/2020   PT End of Session - 05/15/20 1359    Visit Number 1    Number of Visits 4   patient did not schedule follow-up   Date for PT Re-Evaluation 07/10/20    Authorization Type [employer] MCR    PT Start Time 1300    PT Stop Time 5035    PT Time Calculation (min) 45 min    Activity Tolerance Patient tolerated treatment well    Behavior During Therapy [facility] for tasks assessed/performed           Past Medical History:  Diagnosis Date   CAD (coronary artery disease)    a. LHC (02/26/14):  mLAD 20 (faint L>R collats), mCFX 50, pRCA 95 (plaque rupture) >> PCI:  Xience DES to pRCA (severe tortuosity of R innominate artery >> needs L radial or FA in future);  b. LHC (03/10/14):  CFX 90, oOM 70, pRCA stent patent >> PCI:  Xience DES to mCFX    Carotid stenosis    a. Carotid US (02/27/14):  Bilateral ICA 1-39%   Depression    Diabetes mellitus    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[place])    a. Mesenteric Artery Duplex (7/15):  pSMA chronic dissection with aneurysmal dilation of 1.29 cm (VVS);  b.  Chest CTA (02/26/14):  IMPRESSION:  1. No aortic dissection or other acute abnormality.  2. 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  3. Stable 17 mm ectasia of the left common iliac artery.  4. Atherosclerosis, including aortoiliac and coronary artery disease.    History of echocardiogram 09/2015   Echo 1/17: mod LVH, EF 55%, inf-lat HK, Gr 1 DD, mild LAE   Hx of cardiovascular stress test    Lexiscan Myoview (2/16):  Inferior lateral defect c/w thinning vs small prior infarct, no ischemia, EF 49%, Low Risk   Hx of echocardiogram    a.  Echocardiogram (02/27/14):  Mod focal  basal and mild concentric LVH, EF 50-55%, no RWMA, Gr 1 DD, mild TR, normal RVF   Hyperlipidemia    Hypertension    Iliac artery aneurysm, left (HCC)    Myocardial infarction [facility])    Stroke ([place]) 09/26/2009    Past Surgical History:  Procedure Laterality Date   CARDIAC CATHETERIZATION     CARDIAC CATHETERIZATION N/A 06/25/2015   Procedure: Left Heart Cath and Coronary Angiography;  Surgeon: Burnell Blanks, MD;  Location: [place] CV LAB;  Service: Cardiovascular;  Laterality: N/A;   COLONOSCOPY WITH PROPOFOL N/A 07/23/2015   Procedure: COLONOSCOPY WITH PROPOFOL;  Surgeon: [name], MD;  Location: [facility] ENDOSCOPY;  Service: Endoscopy;  Laterality: N/A;   CORONARY ANGIOPLASTY     LEFT HEART CATHETERIZATION WITH CORONARY ANGIOGRAM N/A 02/26/2014   Procedure: LEFT HEART CATHETERIZATION WITH CORONARY ANGIOGRAM;  Surgeon: Wellington Hampshire, MD;  Location: [place] CATH LAB;  Service: Cardiovascular;  Laterality: N/A;   LEFT HEART CATHETERIZATION WITH CORONARY ANGIOGRAM N/A 03/10/2014   Procedure: LEFT HEART CATHETERIZATION WITH CORONARY ANGIOGRAM;  Surgeon: Jettie Booze, MD;  Location: [facility] CATH LAB;  Service: Cardiovascular;  Laterality: N/A;    There were no vitals filed for this visit.    Subjective Assessment - 05/15/20 1300  Subjective Patient reports pain in lower back that has been going on for many years. A log fell on his back in 1976, the pain has gotten worse since that time. Patient reports he has never had treatment for pain before. Pain is located on right side lower back, states will occasionally go down right leg to his foot. Patient states he feels weaker on the right side.    Patient is accompained by: Editor, commissioning - in person interpreter   Limitations Walking;Standing;House hold activities;Sitting;Lifting    How long can you sit comfortably? 30 minutes    How long can you stand comfortably? 2-3 minutes    How long can you walk comfortably? 2-3  minutes    Diagnostic tests X-ray    Patient Stated Goals Get pain better    Currently in Pain? Yes    Pain Score 10-Worst pain ever   Patient states "100"   Pain Location Back    Pain Orientation Right;Lower    Pain Descriptors / Indicators Burning;Numbness    Pain Type Chronic pain    Pain Radiating Towards Numbness down right leg to foot    Pain Onset More than a month ago    Pain Frequency Constant    Aggravating Factors  Standing, walking, sitting, mowing grass    Pain Relieving Factors Exercise machine that "beats muscles of lower back"    Effect of Pain on Daily Activities Patien feels limited in his regular daily activities              [facility] PT Assessment - 05/15/20 0001      Assessment   Medical Diagnosis Low back pain    Referring Provider (PT) Meredith Pel, MD    Onset Date/Surgical Date --   patient reports 1976   Hand Dominance Right    Next MD Visit Not scheduled    Prior Therapy None      Precautions   Precautions None      Restrictions   Weight Bearing Restrictions No      Balance Screen   Has the patient fallen in the past 6 months No    Has the patient had a decrease in activity level because of a fear of falling?  No    Is the patient reluctant to leave their home because of a fear of falling?  No      Home Ecologist residence    Living Arrangements Spouse/significant other    Home Access Stairs to enter    Entrance Stairs-Number of Steps 2-3    Entrance Stairs-Rails None    Additional Comments Patient reports difficulty going up/down stairs with right leg      Prior Function   Level of Independence Independent   states he sometimes has to ask his wife to help   Leisure None reported      Cognition   Overall Cognitive Status Within Functional Limits for tasks assessed      Observation/Other Assessments   Observations Patient appears in no apparent distress    Focus on Therapeutic Outcomes (FOTO)  NA -  language barrier      Sensation   Light Touch Appears Intact    Additional Comments patient does report numbness on right side but not with testing      Coordination   Gross Motor Movements are Fluid and Coordinated Yes      Posture/Postural Control   Posture Comments Patient exhibits rounded shoulder and  forward head posture      ROM / Strength   AROM / PROM / Strength AROM;PROM;Strength      AROM   Overall AROM Comments No directional preference noted with repeated movements    AROM Assessment Site Lumbar    Lumbar Flexion 75%    Lumbar Extension 50% - increased right lower back and right leg pain    Lumbar - Right Side Bend WFL    Lumbar - Left Side Bend WFL    Lumbar - Right Rotation 75%    Lumbar - Left Rotation 75% - increased right lower back pain      PROM   Overall PROM Comments Hip PROM grossly WFL - increased right lower back pain with all movements on right side      Strength   Strength Assessment Site Hip;Knee;Ankle    Right/Left Hip Right;Left    Right Hip Flexion 4/5    Right Hip Extension 4-/5    Right Hip ABduction 4-/5    Left Hip Flexion 4+/5    Left Hip Extension 4/5    Left Hip ABduction 4/5    Right/Left Knee Right;Left    Right Knee Flexion 4+/5    Right Knee Extension 4+/5    Left Knee Flexion 5/5    Left Knee Extension 5/5    Right/Left Ankle Right;Left    Right Ankle Dorsiflexion 5/5    Right Ankle Eversion 4+/5    Left Ankle Dorsiflexion 5/5    Left Ankle Eversion 5/5      Flexibility   Soft Tissue Assessment /Muscle Length yes    Hamstrings Limited mostly on right with increased right leg pain    Piriformis Limited mostly on right      Palpation   Spinal mobility Increased pain lower lumbar CPA    Palpation comment TTP right lumbar paraspinals      Special Tests    Special Tests Lumbar    Lumbar Tests Straight Leg Raise      Straight Leg Raise   Findings Positive    Side  Right      Transfers   Transfers Independent with  all Transfers      Ambulation/Gait   Ambulation/Gait Yes    Ambulation/Gait Assistance 7: Independent    Gait Comments Antalgic on right                      Objective measurements completed on examination: See above findings.       [facility] Adult PT Treatment/Exercise - 05/15/20 0001      Exercises   Exercises Lumbar      Lumbar Exercises: Stretches   Passive Hamstring Stretch 2 reps;20 seconds    Passive Hamstring Stretch Limitations seated edge of table    Single Knee to Chest Stretch 2 reps;20 seconds    Single Knee to Chest Stretch Limitations hooklying    Piriformis Stretch 2 reps;20 seconds    Piriformis Stretch Limitations seated      Lumbar Exercises: Supine   Clam 10 reps    Clam Limitations red band    Bridge 10 reps;3 seconds    Straight Leg Raise 10 reps                  PT Education - 05/15/20 1358    Education Details Exam findings, POC, HEP    Person(s) Educated Patient    Methods Explanation;Demonstration;Tactile cues;Verbal cues;Handout    Comprehension Verbalized understanding;Returned demonstration;Verbal cues required;Tactile  cues required;Need further instruction            PT Short Term Goals - 05/15/20 1407      PT SHORT TERM GOAL #1   Title Patient will be I with initial HEP to progress with PT    Time 4    Period Weeks    Status New    Target Date 06/12/20             PT Long Term Goals - 05/15/20 1407      PT LONG TERM GOAL #1   Title Patient will be I with final HEP to maintain progress from PT    Time 8    Period Weeks    Status New    Target Date 07/10/20      PT LONG TERM GOAL #2   Title Patient will demonstrated lumbar AROM WFL and </= 3/10 pain level to improve lifting ability    Time 8    Period Weeks    Status New    Target Date 07/10/20      PT LONG TERM GOAL #3   Title Patient will demonstrate gross core, hip, and RLE strength >/= 4+/5 MMT in order to improve stair negotiation    Time 8     Period Weeks    Status New    Target Date 07/10/20      PT LONG TERM GOAL #4   Title Patient will be able to walk >/= 20 minutes with </= 5/10 pain level to improve community access and grocery shopping    Time 8    Period Weeks    Status New    Target Date 07/10/20                  Plan - 05/15/20 1400    Clinical Impression Statement Patient presents to PT with long history of right sided low back and leg pain. His lower back symptoms seem facet related with a radicular component. He exhibits limitation in lumbar motion with increased pain mainly into extension, no directional preference noted, core and hip strength deficits with RLE strength deficit, no sensation deficits but patient does report feeling numbness in right leg, good hip motion but report of right sided low back pain with most testing, muscular tightness, and antalgic gait on the right side. He was provided exercises to address impairments listed and he would benefit from continued skilled PT to progress his mobility and strength in order to improve ability to walk longer distances and perform household tasks without pain or limitation.    Personal Factors and Comorbidities Age;Fitness;Past/Current Experience;Time since onset of injury/illness/exacerbation;Comorbidity 1    Comorbidities DM II    Examination-Activity Limitations Locomotion Level;Sit;Stairs;Stand;Lift;Carry;Bend    Examination-Participation Restrictions Meal Prep;Cleaning;Occupation;Driving;Yard Work    [employer]chant [employer] officer Evolving/Moderate complexity    Clinical Decision Making Moderate    Rehab Potential Good    PT Frequency Biweekly   patient did not schedule follow-up visit   PT Duration 8 weeks    PT Treatment/Interventions ADLs/Self Care Home Management;Cryotherapy;Electrical Stimulation;Moist Heat;Traction;Neuromuscular re-education;Therapeutic exercise;Therapeutic activities;Functional mobility training;Stair training;Gait  training;Patient/family education;Manual techniques;Dry needling;Passive range of motion;Taping;Spinal Manipulations;Joint Manipulations    PT Next Visit Plan Assess HEP and progress PRN, manual for right lumbar region, lumbar and hip stretching, core and hip strengthening    PT Home Exercise Plan [id number]: seated hamstring stretch, seated piriformis stretch, supine knee to chest stretch, bridge, supine clamshell with red, SLR    Consulted and Agree  with Plan of Care Patient           Patient will benefit from skilled therapeutic intervention in order to improve the following deficits and impairments:  Abnormal gait, Decreased range of motion, Difficulty walking, Decreased activity tolerance, Pain, Impaired flexibility, Postural dysfunction, Decreased strength  Visit Diagnosis: Chronic right-sided low back pain with right-sided sciatica  Muscle weakness (generalized)  Other abnormalities of gait and mobility     Problem List Patient Active Problem List   Diagnosis Date Noted   Atherosclerotic peripheral vascular disease with ulceration ([place]) 04/03/2015   Coronary artery disease involving native coronary artery of native heart without angina pectoris 03/28/2014   Aneurysm of iliac artery ([place]) 03/18/2014   Abdominal pain, unspecified site 03/18/2014   Syncope in setting of nausea and vomiting 03/07/14 03/08/2014   Non compliance with medical treatment 02/28/2014   Dyslipidemia, goal LDL below 70 02/28/2014   Bradycardia- beta blocker decreased 02/28/2014   Chronic diastolic CHF (congestive heart failure) ([place]) 02/28/2014   Type 2 diabetes mellitus with manifestations ([place]) 02/27/2014   TIA (transient ischemic attack) 02/26/2014   History of non-ST elevation myocardial infarction (NSTEMI) 02/26/2014   Weakness-Left arm / Lef 12/06/2013   Mesenteric artery stenosis ([place]) 12/06/2013   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[place]) 12/03/2012   Hypertension 11/10/2011    History of CVA in adulthood 11/10/2011   Language barrier 11/10/2011    Hilda Blades, PT, DPT, LAT, ATC 05/15/20  2:17 PM Phone: [phone] Fax: [facility] [address] [place], [place], [address] Phone: [phone]   Fax:  [phone]  Name: Kitt Ledet MRN: [id number] Date of Birth: November 27, 1950

## 2020-05-18 ENCOUNTER — Telehealth: Payer: Self-pay | Admitting: Endocrinology

## 2020-05-18 MED ORDER — LEVEMIR FLEXTOUCH 100 UNIT/ML ~~LOC~~ SOPN: 15.0000 [IU] | PEN_INJECTOR | SUBCUTANEOUS | 11 refills | Status: DC

## 2020-05-18 NOTE — Telephone Encounter (Signed)
Called son and informed about new orders below. Verbalized acceptance and understanding.  Outpatient Medication Detail   Disp Refills Start End   insulin detemir (LEVEMIR FLEXTOUCH) 100 UNIT/ML FlexPen 15 mL 11 05/18/2020    Sig - Route: Inject 15 Units into the skin every morning. And pen needles 1/day. - Subcutaneous   Sent to pharmacy as: insulin detemir (LEVEMIR FLEXTOUCH) 100 UNIT/ML FlexPen   E-Prescribing Status: Receipt confirmed by pharmacy (05/18/2020 12:37 PM EDT)

## 2020-05-18 NOTE — Telephone Encounter (Signed)
Please advise if you would prefer to move up his appt from 9/23 to next available

## 2020-05-18 NOTE — Telephone Encounter (Signed)
Patient's son Barry [name] requests to be called at ph# [phone] re: Barry [name] states that Patient is having complications since starting Lantus Solostar such as: bloating, constipated and unable to go to the bathroom and feels extremely fatigued (patient is unable to do anything).

## 2020-05-18 NOTE — Telephone Encounter (Signed)
Ok, I have sent a prescription to your pharmacy, to change to levemir, 15 units qam.

## 2020-06-09 ENCOUNTER — Other Ambulatory Visit: Payer: Self-pay | Admitting: Endocrinology

## 2020-06-09 DIAGNOSIS — E118 Type 2 diabetes mellitus with unspecified complications: Secondary | ICD-10-CM

## 2020-06-18 ENCOUNTER — Other Ambulatory Visit: Payer: Self-pay

## 2020-06-18 ENCOUNTER — Ambulatory Visit (INDEPENDENT_AMBULATORY_CARE_PROVIDER_SITE_OTHER): Payer: Medicare Other | Admitting: Endocrinology

## 2020-06-18 VITALS — BP 134/70 | HR 59 | Ht 65.0 in | Wt 156.0 lb

## 2020-06-18 DIAGNOSIS — E118 Type 2 diabetes mellitus with unspecified complications: Secondary | ICD-10-CM | POA: Diagnosis not present

## 2020-06-18 DIAGNOSIS — H903 Sensorineural hearing loss, bilateral: Secondary | ICD-10-CM | POA: Diagnosis not present

## 2020-06-18 LAB — POCT GLYCOSYLATED HEMOGLOBIN (HGB A1C): Hemoglobin A1C: 8.7 % — AB (ref 4.0–5.6)

## 2020-06-18 MED ORDER — ACARBOSE 25 MG PO TABS: 25.0000 mg | ORAL_TABLET | Freq: Three times a day (TID) | ORAL | 11 refills | Status: DC

## 2020-06-18 NOTE — Patient Instructions (Addendum)
Please continue the same glipizide, and: I have sent a prescription to your pharmacy, to add "acarbose."  This will help the bowels work better.   check your blood sugar twice a day.  vary the time of day when you check, between before the 3 meals, and at bedtime.  also check if you have symptoms of your blood sugar being too high or too low.  please keep a record of the readings and bring it to your next appointment here (or you can bring the meter itself).  You can write it on any piece of paper.  please call us sooner if your blood sugar goes below 70, or if you have a lot of readings over 200. Please come back for a follow-up appointment in 1 month.  We'll reconsider the insulin then.     ??????? glipizide ??????????? ????????????????????????????????????????????????????" ???????" ? ?????????????????????????????? ????????????????????????????????????????????????? ???????????????????????????????????????????????????????? ? ???????????????? ????????????????????????????????????????????????????????????????????? ???????????????????????????????????????????????????????????????????? (????????????????????????????) ? ?????????????????????????????? ??????????????????????????????????????????????????????????? ?? ???????????????????????????????? ??? ? ???????????????????????????????????????????????? ? ??? ????????????????????????????????????????.

## 2020-06-18 NOTE — Progress Notes (Signed)
Subjective:    Patient ID: Barry [name], male    DOB: 1951/08/10, 69 y.o.   MRN: [id number]  HPI Pt returns for f/u of diabetes mellitus:  DM type: Insulin-requiring type 2 Dx'ed: 9833 Complications: PN, stage 3 CRI, PAD, and CAD Therapy: glipizide.   DKA: never Severe hypoglycemia: never Pancreatitis: never Pancreatic imaging: normal on 2019 CT.  Other: he did not tolerate metformin or victoza (abd bloating and diarrhea); he took Levemir 2016-2018 (he stopped due to edema and constipation).   Interval history: pt states he feels well in general.  He takes glipizide as rx'ed. He says cbg's are in the 100's.  He again stopped insulin, due to abd bloating and constipation.   Past Medical History:  Diagnosis Date  . CAD (coronary artery disease)    a. LHC (02/26/14):  mLAD 20 (faint L>R collats), mCFX 50, pRCA 95 (plaque rupture) >> PCI:  Xience DES to pRCA (severe tortuosity of R innominate artery >> needs L radial or FA in future);  b. LHC (03/10/14):  CFX 90, oOM 70, pRCA stent patent >> PCI:  Xience DES to mCFX   . Carotid stenosis    a. Carotid US (02/27/14):  Bilateral ICA 1-39%  . Depression   . Diabetes mellitus   . 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[place])    a. Mesenteric Artery Duplex (7/15):  pSMA chronic dissection with aneurysmal dilation of 1.29 cm (VVS);  b.  Chest CTA (02/26/14):  IMPRESSION:  1. No aortic dissection or other acute abnormality.  2. 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  3. Stable 17 mm ectasia of the left common iliac artery.  4. Atherosclerosis, including aortoiliac and coronary artery disease.   Marland Kitchen History of echocardiogram 09/2015   Echo 1/17: mod LVH, EF 55%, inf-lat HK, Gr 1 DD, mild LAE  . Hx of cardiovascular stress test    Lexiscan Myoview (2/16):  Inferior lateral defect c/w thinning vs small prior infarct, no ischemia, EF 49%, Low Risk  . Hx of echocardiogram    a.  Echocardiogram (02/27/14):  Mod focal basal and mild concentric LVH, EF 50-55%,  no RWMA, Gr 1 DD, mild TR, normal RVF  . Hyperlipidemia   . Hypertension   . Iliac artery aneurysm, left ([place])   . Myocardial infarction ([place])   . Stroke [facility]) 09/26/2009    Past Surgical History:  Procedure Laterality Date  . CARDIAC CATHETERIZATION    . CARDIAC CATHETERIZATION N/A 06/25/2015   Procedure: Left Heart Cath and Coronary Angiography;  Surgeon: Burnell Blanks, MD;  Location: [place] CV LAB;  Service: Cardiovascular;  Laterality: N/A;  . COLONOSCOPY WITH PROPOFOL N/A 07/23/2015   Procedure: COLONOSCOPY WITH PROPOFOL;  Surgeon: [name], MD;  Location: [facility];  Service: Endoscopy;  Laterality: N/A;  . CORONARY ANGIOPLASTY    . LEFT HEART CATHETERIZATION WITH CORONARY ANGIOGRAM N/A 02/26/2014   Procedure: LEFT HEART CATHETERIZATION WITH CORONARY ANGIOGRAM;  Surgeon: Wellington Hampshire, MD;  Location: [place] CATH LAB;  Service: Cardiovascular;  Laterality: N/A;  . LEFT HEART CATHETERIZATION WITH CORONARY ANGIOGRAM N/A 03/10/2014   Procedure: LEFT HEART CATHETERIZATION WITH CORONARY ANGIOGRAM;  Surgeon: Jettie Booze, MD;  Location: [facility] CATH LAB;  Service: Cardiovascular;  Laterality: N/A;    Social History   Socioeconomic History  . Marital status: Married    Spouse name: Not on file  . Number of children: 3  . Years of education: Not on file  . Highest education level: Not  on file  Occupational History  . Occupation: Retired  Tobacco Use  . Smoking status: Former Smoker    Packs/day: 0.25    Years: 38.00    Pack years: 9.50    Types: Cigarettes    Quit date: 2008    Years since quitting: 13.7  . Smokeless tobacco: Never Used  Vaping Use  . Vaping Use: Never used  Substance and Sexual Activity  . Alcohol use: No    Alcohol/week: 0.0 standard drinks  . Drug use: No  . Sexual activity: Yes  Other Topics Concern  . Not on file  Social History Narrative   Marital: married.      Lives: with son,wife.       Children: 3 children; 6  grandchildren      Employed: unemployed; disability unknown reason/CVA L sided weakness      Tobacco:  Quit 2012; smoked 40 years.       Alcohol: no drinking now; social in past.       Drugs: none       Exercise: sporadic.       ADLs:  No driving since CVA.   Social Determinants of Health   Financial Resource Strain:   . Difficulty of Paying Living Expenses: Not on file  Food Insecurity:   . Worried About Charity fundraiser in the Last Year: Not on file  . Ran Out of Food in the Last Year: Not on file  Transportation Needs:   . Lack of Transportation (Medical): Not on file  . Lack of Transportation (Non-Medical): Not on file  Physical Activity:   . Days of Exercise per Week: Not on file  . Minutes of Exercise per Session: Not on file  Stress:   . Feeling of Stress : Not on file  Social Connections:   . Frequency of Communication with Friends and Family: Not on file  . Frequency of Social Gatherings with Friends and Family: Not on file  . Attends Religious Services: Not on file  . Active Member of Clubs or Organizations: Not on file  . Attends Archivist Meetings: Not on file  . Marital Status: Not on file  Intimate Partner Violence:   . Fear of Current or Ex-Partner: Not on file  . Emotionally Abused: Not on file  . Physically Abused: Not on file  . Sexually Abused: Not on file    Current Outpatient Medications on File Prior to Visit  Medication Sig Dispense Refill  . amLODipine (NORVASC) 5 MG tablet Take 1 tablet (5 mg total) by mouth daily. 30 tablet 11  . aspirin EC 81 MG EC tablet Take 1 tablet (81 mg total) by mouth daily.    . blood glucose meter kit and supplies Dispense based on patient and insurance preference. Use up to four times daily as directed. (FOR ICD-10 E10.9, E11.9). 1 each 0  . Blood Glucose Monitoring Suppl w/Device KIT 1 kit by Does not apply route as directed. 1 kit 0  . ezetimibe (ZETIA) 10 MG tablet Take 1 tablet (10 mg total) by mouth  daily. 90 tablet 3  . gabapentin (NEURONTIN) 300 MG capsule Take 1 capsule (300 mg total) by mouth at bedtime. 90 capsule 3  . glipiZIDE (GLUCOTROL) 5 MG tablet TAKE 1 TABLET BY MOUTH ONCE DAILY BEFORE BREAKFAST 30 tablet 0  . glucose blood (ONE TOUCH ULTRA TEST) test strip 1 each by Other route daily. And lancets 1/day 100 each 3  . Lancets [facility]  PLUS [id number]) MISC SMARTSIG:1 Topical Daily    . lisinopril (ZESTRIL) 20 MG tablet Take 0.5 tablets (10 mg total) by mouth daily. 15 tablet 11  . rosuvastatin (CRESTOR) 40 MG tablet Take 1 tablet (40 mg total) by mouth daily. 90 tablet 3  . TECHLITE PEN NEEDLES 32G X 8 MM MISC USE ONE PEN NEEDLE TO CHECK BLOOD GLUCOSE ONCE DAILY 100 each 3  . valACYclovir (VALTREX) 1000 MG tablet Take 1 tablet (1,000 mg total) by mouth 3 (three) times daily. 21 tablet 0   No current facility-administered medications on file prior to visit.    Allergies  Allergen Reactions  . Metformin And Related     Pt feels that this causes constipation and will not take     Family History  Problem Relation Age of Onset  . Diabetes Father   . Hypertension Father   . Heart attack Father   . Stroke Neg Hx     BP 134/70   Pulse (!) 59   Ht _0  (1.651 m)   Wt 156 lb (70.8 kg)   SpO2 98%   BMI 25.96 kg/m    Review of Systems     Objective:   Physical Exam VITAL SIGNS:  See vs page GENERAL: no distress   Lab Results  Component Value Date   HGBA1C 10.1 (A) 05/13/2020       Assessment & Plan:  Type 2 DM abd bloating, due to insulin.  We'll try to rx bloating with acarbose for now.  We'll re-try insulin later.    Patient Instructions  Please continue the same glipizide, and: I have sent a prescription to your pharmacy, to add "acarbose."  This will help the bowels work better.   check your blood sugar twice a day.  vary the time of day when you check, between before the 3 meals, and at bedtime.  also check if you have symptoms of your blood  sugar being too high or too low.  please keep a record of the readings and bring it to your next appointment here (or you can bring the meter itself).  You can write it on any piece of paper.  please call us sooner if your blood sugar goes below 70, or if you have a lot of readings over 200. Please come back for a follow-up appointment in 1 month.  We'll reconsider the insulin then.     ??????? glipizide ??????????? ????????????????????????????????????????????????????" ???????" ? ?????????????????????????????? ????????????????????????????????????????????????? ???????????????????????????????????????????????????????? ? ???????????????? ????????????????????????????????????????????????????????????????????? ???????????????????????????????????????????????????????????????????? (????????????????????????????) ? ?????????????????????????????? ??????????????????????????????????????????????????????????? ?? ???????????????????????????????? ??? ? ???????????????????????????????????????????????? ? ??? ????????????????????????????????????????.

## 2020-07-07 ENCOUNTER — Other Ambulatory Visit: Payer: Self-pay | Admitting: Endocrinology

## 2020-07-07 DIAGNOSIS — E118 Type 2 diabetes mellitus with unspecified complications: Secondary | ICD-10-CM

## 2020-07-21 NOTE — Progress Notes (Signed)
[place] at [facility], [place], [place] [address] [phone]  Date:  07/23/2020   Name:  Barry [name]   DOB:  08-Dec-1950   MRN:  [id number]  PCP:  Darreld Mclean, MD    Chief Complaint: Hyperglycemia   History of Present Illness:  Barry [name] is a 69 y.o. very pleasant male patient who presents with the following:  Here today for routine follow-up visit Last seen by myself in August of this year-at that time we referred him to the ER for chest pain; he was evaluated and released to home  Patient is seen today accompanied by his wife.  Visit was assisted by telehealth interpreter  History of CAD status post NSTEMI and stenting x2, chronic superior mesenteric artery dissection followed by vascular surgery, PAD, stroke in 2011, diabetes, hypertension, hyperlipidemia, CKD-his care is also complicated by language barrier and some medical noncompliance  Endocrinology-Dr. Urbano Heir managing his diabetes; most recent visit about 1 month ago A1c is improving as below He is using a once daily insulin-they bring in some home blood glucose checks for me to review.  Lowest glucose in the 80s, highest around 200.  They are concerned that he is still having too much variation.  Certainly this may be true, but I reassured them that things are going the right direction.  They will see Dr. Loanne Drilling in about 10 days for follow-up  Eye exam COVID-19 vaccine -done, dates added to chart Tetanus vaccine-we will need at pharmacy as patient is Medicare age Flu shot-  Give today  Can give dose of Pneumovax also  Lab Results  Component Value Date   HGBA1C 8.7 (A) 06/18/2020  Lipid profile done in August  Offered to do a PSA to screen for prostate cancer, he would like to do this today  Patient Active Problem List   Diagnosis Date Noted  . Atherosclerotic peripheral vascular disease with ulceration ([place]) 04/03/2015  . Coronary artery  disease involving native coronary artery of native heart without angina pectoris 03/28/2014  . Aneurysm of iliac artery ([place]) 03/18/2014  . Abdominal pain, unspecified site 03/18/2014  . Syncope in setting of nausea and vomiting 03/07/14 03/08/2014  . Non compliance with medical treatment 02/28/2014  . Dyslipidemia, goal LDL below 70 02/28/2014  . Bradycardia- beta blocker decreased 02/28/2014  . Chronic diastolic CHF (congestive heart failure) ([place]) 02/28/2014  . Type 2 diabetes mellitus with manifestations ([place]) 02/27/2014  . TIA (transient ischemic attack) 02/26/2014  . History of non-ST elevation myocardial infarction (NSTEMI) 02/26/2014  . Weakness-Left arm / Lef 12/06/2013  . Mesenteric artery stenosis ([place]) 12/06/2013  . 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[place]) 12/03/2012  . Hypertension 11/10/2011  . History of CVA in adulthood 11/10/2011  . Language barrier 11/10/2011    Past Medical History:  Diagnosis Date  . CAD (coronary artery disease)    a. LHC (02/26/14):  mLAD 20 (faint L>R collats), mCFX 50, pRCA 95 (plaque rupture) >> PCI:  Xience DES to pRCA (severe tortuosity of R innominate artery >> needs L radial or FA in future);  b. LHC (03/10/14):  CFX 90, oOM 70, pRCA stent patent >> PCI:  Xience DES to mCFX   . Carotid stenosis    a. Carotid US (02/27/14):  Bilateral ICA 1-39%  . Depression   . Diabetes mellitus   . 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[place])    a. Mesenteric Artery Duplex (7/15):  pSMA chronic dissection  with aneurysmal dilation of 1.29 cm (VVS);  b.  Chest CTA (02/26/14):  IMPRESSION:  1. No aortic dissection or other acute abnormality.  2. 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  3. Stable 17 mm ectasia of the left common iliac artery.  4. Atherosclerosis, including aortoiliac and coronary artery disease.   Marland Kitchen History of echocardiogram 09/2015   Echo 1/17: mod LVH, EF 55%, inf-lat HK, Gr 1 DD, mild LAE  . Hx of cardiovascular stress test    Lexiscan Myoview  (2/16):  Inferior lateral defect c/w thinning vs small prior infarct, no ischemia, EF 49%, Low Risk  . Hx of echocardiogram    a.  Echocardiogram (02/27/14):  Mod focal basal and mild concentric LVH, EF 50-55%, no RWMA, Gr 1 DD, mild TR, normal RVF  . Hyperlipidemia   . Hypertension   . Iliac artery aneurysm, left ([place])   . Myocardial infarction ([place])   . Stroke [facility]) 09/26/2009    Past Surgical History:  Procedure Laterality Date  . CARDIAC CATHETERIZATION    . CARDIAC CATHETERIZATION N/A 06/25/2015   Procedure: Left Heart Cath and Coronary Angiography;  Surgeon: Burnell Blanks, MD;  Location: [place] CV LAB;  Service: Cardiovascular;  Laterality: N/A;  . COLONOSCOPY WITH PROPOFOL N/A 07/23/2015   Procedure: COLONOSCOPY WITH PROPOFOL;  Surgeon: [name], MD;  [facility]: [facility];  Service: Endoscopy;  Laterality: N/A;  . CORONARY ANGIOPLASTY    . LEFT HEART CATHETERIZATION WITH CORONARY ANGIOGRAM N/A 02/26/2014   Procedure: LEFT HEART CATHETERIZATION WITH CORONARY ANGIOGRAM;  Surgeon: Wellington Hampshire, MD;  Location: [place] CATH LAB;  Service: Cardiovascular;  Laterality: N/A;  . LEFT HEART CATHETERIZATION WITH CORONARY ANGIOGRAM N/A 03/10/2014   Procedure: LEFT HEART CATHETERIZATION WITH CORONARY ANGIOGRAM;  Surgeon: Jettie Booze, MD;  Location: [facility] CATH LAB;  Service: Cardiovascular;  Laterality: N/A;    Social History   Tobacco Use  . Smoking status: Former Smoker    Packs/day: 0.25    Years: 38.00    Pack years: 9.50    Types: Cigarettes    Quit date: 2008    Years since quitting: 13.8  . Smokeless tobacco: Never Used  Vaping Use  . Vaping Use: Never used  Substance Use Topics  . Alcohol use: No    Alcohol/week: 0.0 standard drinks  . Drug use: No    Family History  Problem Relation Age of Onset  . Diabetes Father   . Hypertension Father   . Heart attack Father   . Stroke Neg Hx     Allergies  Allergen Reactions  . Metformin And Related      Pt feels that this causes constipation and will not take     Medication list has been reviewed and updated.  Current Outpatient Medications on File Prior to Visit  Medication Sig Dispense Refill  . acarbose (PRECOSE) 25 MG tablet Take 1 tablet (25 mg total) by mouth 3 (three) times daily with meals. 90 tablet 11  . amLODipine (NORVASC) 5 MG tablet Take 1 tablet (5 mg total) by mouth daily. 30 tablet 11  . aspirin EC 81 MG EC tablet Take 1 tablet (81 mg total) by mouth daily.    . blood glucose meter kit and supplies Dispense based on patient and insurance preference. Use up to four times daily as directed. (FOR ICD-10 E10.9, E11.9). 1 each 0  . Blood Glucose Monitoring Suppl w/Device KIT 1 kit by Does not apply route as directed. 1  kit 0  . ezetimibe (ZETIA) 10 MG tablet Take 1 tablet (10 mg total) by mouth daily. 90 tablet 3  . gabapentin (NEURONTIN) 300 MG capsule Take 1 capsule (300 mg total) by mouth at bedtime. 90 capsule 3  . glipiZIDE (GLUCOTROL) 5 MG tablet TAKE 1 TABLET BY MOUTH ONCE DAILY BEFORE BREAKFAST 30 tablet 0  . glucose blood (ONE TOUCH ULTRA TEST) test strip 1 each by Other route daily. And lancets 1/day 100 each 3  . Lancets (ONETOUCH DELICA PLUS [id number]) MISC SMARTSIG:1 Topical Daily    . lisinopril (ZESTRIL) 20 MG tablet Take 0.5 tablets (10 mg total) by mouth daily. 15 tablet 11  . TECHLITE PEN NEEDLES 32G X 8 MM MISC USE ONE PEN NEEDLE TO CHECK BLOOD GLUCOSE ONCE DAILY 100 each 3  . valACYclovir (VALTREX) 1000 MG tablet Take 1 tablet (1,000 mg total) by mouth 3 (three) times daily. 21 tablet 0  . rosuvastatin (CRESTOR) 40 MG tablet Take 1 tablet (40 mg total) by mouth daily. 90 tablet 3   No current facility-administered medications on file prior to visit.    Review of Systems:  As per HPI- otherwise negative.   Physical Examination: Vitals:   07/23/20 0911  BP: 112/62  Pulse: 72  Resp: 17  SpO2: 98%   Vitals:   07/23/20 0911  Weight: 160 lb (72.6 kg)   Height: 5' 5"  (1.651 m)   Body mass index is 26.63 kg/m. Ideal Body Weight: Weight in (lb) to have BMI = 25: 149.9  GEN: no acute distress.  Normal weight, looks well HEENT: Atraumatic, Normocephalic.   Bilateral TM wnl, oropharynx normal.  PEERL,EOMI.   Ears and Nose: No external deformity. CV: RRR, No M/G/R. No JVD. No thrill. No extra heart sounds. PULM: CTA B, no wheezes, crackles, rhonchi. No retractions. No resp. distress. No accessory muscle use. ABD: S, NT, ND, +BS. No rebound. No HSM. EXTR: No c/c/e PSYCH: Normally interactive. Conversant.    Assessment and Plan: Type 2 diabetes mellitus with complication, without long-term current use of insulin ([place]) - Plan: glucose blood (ONE TOUCH ULTRA TEST) test strip, Insulin Pen Needle (TECHLITE PEN NEEDLES) 32G X 8 MM MISC  Essential hypertension  Dyslipidemia  Immunization due - Plan: Pneumococcal polysaccharide vaccine 23-valent greater than or equal to 2yo subcutaneous/IM, Flu Vaccine QUAD High Dose(Fluad)  Screening for prostate cancer - Plan: PSA  Following up today Diabetes is coming under better control with help of endocrinology.  Reminded about endocrinology appointment next month Refilled pen needles and strips Blood pressure well controlled Taking Crestor, lipid profile in August looked good Flu vaccine and pneumonia vaccine today Order PSA Request follow-up 6 months This visit occurred during the SARS-CoV-2 public health emergency.  Safety protocols were in place, including screening questions prior to the visit, additional usage of staff PPE, and extensive cleaning of exam room while observing appropriate contact time as indicated for disinfecting solutions.      Signed Lamar Blinks, MD

## 2020-07-23 ENCOUNTER — Encounter: Payer: Self-pay | Admitting: Family Medicine

## 2020-07-23 ENCOUNTER — Other Ambulatory Visit: Payer: Self-pay

## 2020-07-23 ENCOUNTER — Ambulatory Visit (INDEPENDENT_AMBULATORY_CARE_PROVIDER_SITE_OTHER): Payer: Medicare Other | Admitting: Family Medicine

## 2020-07-23 VITALS — BP 112/62 | HR 72 | Resp 17 | Ht 65.0 in | Wt 160.0 lb

## 2020-07-23 DIAGNOSIS — E785 Hyperlipidemia, unspecified: Secondary | ICD-10-CM | POA: Diagnosis not present

## 2020-07-23 DIAGNOSIS — E118 Type 2 diabetes mellitus with unspecified complications: Secondary | ICD-10-CM | POA: Diagnosis not present

## 2020-07-23 DIAGNOSIS — Z125 Encounter for screening for malignant neoplasm of prostate: Secondary | ICD-10-CM

## 2020-07-23 DIAGNOSIS — I10 Essential (primary) hypertension: Secondary | ICD-10-CM | POA: Diagnosis not present

## 2020-07-23 DIAGNOSIS — Z23 Encounter for immunization: Secondary | ICD-10-CM

## 2020-07-23 MED ORDER — GLUCOSE BLOOD VI STRP: ORAL_STRIP | 3 refills | Status: DC

## 2020-07-23 MED ORDER — TECHLITE PEN NEEDLES 32G X 8 MM MISC: 3 refills | Status: DC

## 2020-07-23 NOTE — Patient Instructions (Signed)
Good to see you again today- you got your flu and pneumonia vaccines today I would recommend that you get the shingles vaccine and tetanus booster at your pharmacy Please tell Dr Loanne Drilling your concerns about blood sugar- he may wish to adjust your insulin a bit when you see him next month  Please see me in about 6 months and take care

## 2020-07-24 ENCOUNTER — Encounter: Payer: Self-pay | Admitting: Family Medicine

## 2020-07-24 LAB — PSA: PSA: 0.81 ng/mL (ref <=4.0)

## 2020-07-30 ENCOUNTER — Telehealth: Payer: Self-pay | Admitting: Endocrinology

## 2020-07-30 NOTE — Telephone Encounter (Signed)
Patient's son Pros requests to be called at ph# [phone] re: Patient is having high blood sugars (a couple of weeks  ago bs was over 300-son made patient appt at PCP but is not sure of outcome).

## 2020-07-30 NOTE — Telephone Encounter (Signed)
Spoke with patient's son

## 2020-07-31 ENCOUNTER — Other Ambulatory Visit: Payer: Self-pay

## 2020-07-31 DIAGNOSIS — E118 Type 2 diabetes mellitus with unspecified complications: Secondary | ICD-10-CM

## 2020-07-31 MED ORDER — TECHLITE PEN NEEDLES 32G X 8 MM MISC: 3 refills | Status: DC

## 2020-08-03 ENCOUNTER — Encounter: Payer: Self-pay | Admitting: Endocrinology

## 2020-08-03 ENCOUNTER — Ambulatory Visit (INDEPENDENT_AMBULATORY_CARE_PROVIDER_SITE_OTHER): Payer: Medicare Other | Admitting: Endocrinology

## 2020-08-03 ENCOUNTER — Other Ambulatory Visit: Payer: Self-pay

## 2020-08-03 VITALS — BP 116/80 | HR 76 | Wt 162.0 lb

## 2020-08-03 DIAGNOSIS — Z794 Long term (current) use of insulin: Secondary | ICD-10-CM | POA: Diagnosis not present

## 2020-08-03 DIAGNOSIS — E118 Type 2 diabetes mellitus with unspecified complications: Secondary | ICD-10-CM

## 2020-08-03 DIAGNOSIS — E1140 Type 2 diabetes mellitus with diabetic neuropathy, unspecified: Secondary | ICD-10-CM

## 2020-08-03 LAB — POCT GLYCOSYLATED HEMOGLOBIN (HGB A1C): Hemoglobin A1C: 8.3 % — AB (ref 4.0–5.6)

## 2020-08-03 MED ORDER — TECHLITE PEN NEEDLES 32G X 8 MM MISC: 3 refills | Status: DC

## 2020-08-03 MED ORDER — GLIPIZIDE 5 MG PO TABS: 2.5000 mg | ORAL_TABLET | Freq: Every day | ORAL | 0 refills | Status: DC

## 2020-08-03 MED ORDER — LEVEMIR FLEXTOUCH 100 UNIT/ML ~~LOC~~ SOPN: 15.0000 [IU] | PEN_INJECTOR | SUBCUTANEOUS | 2 refills | Status: DC

## 2020-08-03 NOTE — Patient Instructions (Addendum)
Please reduce the glipizide to 1/2 pill with breakfast and: Stop taking the acarbose, and: Increase the Levemir insulin to 15 units each morning.  check your blood sugar twice a day.  vary the time of day when you check, between before the 3 meals, and at bedtime.  also check if you have symptoms of your blood sugar being too high or too low.  please keep a record of the readings and bring it to your next appointment here (or you can bring the meter itself).  You can write it on any piece of paper.  please call us sooner if your blood sugar goes below 70, or if you have a lot of readings over 200. Please come back for a follow-up appointment in 1 month.     ???????????? glipizide ?? 1/2 ??????????????????????????? ???: ???????????? acarbose ???? ????????????????? Levemir ??? 15 ?????????????????? ????????????????????????????????????????????????? ???????????????????????????????????????? ??????????????? 3 ??? ????????????? ?????????????????????????????????????????????????????????????????????? ????????????????????????? ???????????????????????????????????????????? (???????????????????????????????)? ?????????????????????????????? ?????????????????????????? ???????????????????????????????????????? 70 ???????????????????????????????? 200? ???????????????????????????????????????????????? 1 ???

## 2020-08-03 NOTE — Progress Notes (Signed)
Subjective:    Patient ID: Barry [name], male    DOB: August 17, 1951, 69 y.o.   MRN: [id number]  HPI Pt returns for f/u of diabetes mellitus:  DM type: Insulin-requiring type 2 Dx'ed: 0272 Complications: PN, stage 3 CRI, PAD, and CAD Therapy: insulin since 2021, and glipizide.   DKA: never Severe hypoglycemia: never Pancreatitis: never Pancreatic imaging: normal on 2019 CT.  Other: he did not tolerate metformin or victoza (abd bloating and diarrhea); he also took Levemir 2016-2018 (he stopped due to edema and constipation).   Interval history: pt states he feels well in general.  He says cbg's are in the 100's.  He takes Levemir, 10 units qam.  He also takes Glipizide but not acarbose.  He brings a record of his cbg's which I have reviewed today.  cbg varies from 62-232.  There is no trend throughout the day.   Past Medical History:  Diagnosis Date  . CAD (coronary artery disease)    a. LHC (02/26/14):  mLAD 20 (faint L>R collats), mCFX 50, pRCA 95 (plaque rupture) >> PCI:  Xience DES to pRCA (severe tortuosity of R innominate artery >> needs L radial or FA in future);  b. LHC (03/10/14):  CFX 90, oOM 70, pRCA stent patent >> PCI:  Xience DES to mCFX   . Carotid stenosis    a. Carotid US (02/27/14):  Bilateral ICA 1-39%  . Depression   . Diabetes mellitus   . 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[place])    a. Mesenteric Artery Duplex (7/15):  pSMA chronic dissection with aneurysmal dilation of 1.29 cm (VVS);  b.  Chest CTA (02/26/14):  IMPRESSION:  1. No aortic dissection or other acute abnormality.  2. 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  3. Stable 17 mm ectasia of the left common iliac artery.  4. Atherosclerosis, including aortoiliac and coronary artery disease.   Marland Kitchen History of echocardiogram 09/2015   Echo 1/17: mod LVH, EF 55%, inf-lat HK, Gr 1 DD, mild LAE  . Hx of cardiovascular stress test    Lexiscan Myoview (2/16):  Inferior lateral defect c/w thinning vs small prior infarct, no  ischemia, EF 49%, Low Risk  . Hx of echocardiogram    a.  Echocardiogram (02/27/14):  Mod focal basal and mild concentric LVH, EF 50-55%, no RWMA, Gr 1 DD, mild TR, normal RVF  . Hyperlipidemia   . Hypertension   . Iliac artery aneurysm, left ([place])   . Myocardial infarction ([place])   . Stroke [facility]) 09/26/2009    Past Surgical History:  Procedure Laterality Date  . CARDIAC CATHETERIZATION    . CARDIAC CATHETERIZATION N/A 06/25/2015   Procedure: Left Heart Cath and Coronary Angiography;  Surgeon: Burnell Blanks, MD;  Location: [place] [facility];  Service: Cardiovascular;  Laterality: N/A;  . COLONOSCOPY WITH PROPOFOL N/A 07/23/2015   Procedure: COLONOSCOPY WITH PROPOFOL;  Surgeon: [name], MD;  Location: [facility] ENDOSCOPY;  Service: Endoscopy;  Laterality: N/A;  . CORONARY ANGIOPLASTY    . LEFT HEART CATHETERIZATION WITH CORONARY ANGIOGRAM N/A 02/26/2014   Procedure: LEFT HEART CATHETERIZATION WITH CORONARY ANGIOGRAM;  Surgeon: Wellington Hampshire, MD;  Location: [place] CATH LAB;  Service: Cardiovascular;  Laterality: N/A;  . LEFT HEART CATHETERIZATION WITH CORONARY ANGIOGRAM N/A 03/10/2014   Procedure: LEFT HEART CATHETERIZATION WITH CORONARY ANGIOGRAM;  Surgeon: Jettie Booze, MD;  Location: [facility] CATH LAB;  Service: Cardiovascular;  Laterality: N/A;    Social History   Socioeconomic History  . Marital  status: Married    Spouse name: Not on file  . Number of children: 3  . Years of education: Not on file  . Highest education level: Not on file  Occupational History  . Occupation: Retired  Tobacco Use  . Smoking status: Former Smoker    Packs/day: 0.25    Years: 38.00    Pack years: 9.50    Types: Cigarettes    Quit date: 2008    Years since quitting: 13.8  . Smokeless tobacco: Never Used  Vaping Use  . Vaping Use: Never used  Substance and Sexual Activity  . Alcohol use: No    Alcohol/week: 0.0 standard drinks  . Drug use: No  . Sexual activity: Yes  Other Topics  Concern  . Not on file  Social History Narrative   Marital: married.      Lives: with son,wife.       Children: 3 children; 6 grandchildren      Employed: unemployed; disability unknown reason/CVA L sided weakness      Tobacco:  Quit 2012; smoked 40 years.       Alcohol: no drinking now; social in past.       Drugs: none       Exercise: sporadic.       ADLs:  No driving since CVA.   Social Determinants of Health   Financial Resource Strain:   . Difficulty of Paying Living Expenses: Not on file  Food Insecurity:   . Worried About Charity fundraiser in the Last Year: Not on file  . Ran Out of Food in the Last Year: Not on file  Transportation Needs:   . Lack of Transportation (Medical): Not on file  . Lack of Transportation (Non-Medical): Not on file  Physical Activity:   . Days of Exercise per Week: Not on file  . Minutes of Exercise per Session: Not on file  Stress:   . Feeling of Stress : Not on file  Social Connections:   . Frequency of Communication with Friends and Family: Not on file  . Frequency of Social Gatherings with Friends and Family: Not on file  . Attends Religious Services: Not on file  . Active Member of Clubs or Organizations: Not on file  . Attends Archivist Meetings: Not on file  . Marital Status: Not on file  Intimate Partner Violence:   . Fear of Current or Ex-Partner: Not on file  . Emotionally Abused: Not on file  . Physically Abused: Not on file  . Sexually Abused: Not on file    Current Outpatient Medications on File Prior to Visit  Medication Sig Dispense Refill  . amLODipine (NORVASC) 5 MG tablet Take 1 tablet (5 mg total) by mouth daily. 30 tablet 11  . aspirin EC 81 MG EC tablet Take 1 tablet (81 mg total) by mouth daily.    . blood glucose meter kit and supplies Dispense based on patient and insurance preference. Use up to four times daily as directed. (FOR ICD-10 E10.9, E11.9). 1 each 0  . Blood Glucose Monitoring Suppl  w/Device KIT 1 kit by Does not apply route as directed. 1 kit 0  . ezetimibe (ZETIA) 10 MG tablet Take 1 tablet (10 mg total) by mouth daily. 90 tablet 3  . gabapentin (NEURONTIN) 300 MG capsule Take 1 capsule (300 mg total) by mouth at bedtime. 90 capsule 3  . glucose blood (ONE TOUCH ULTRA TEST) test strip Use daily to check glucose- may  use up to twice a day. 100 each 3  . Lancets (ONETOUCH DELICA PLUS [id number]) MISC SMARTSIG:1 Topical Daily    . lisinopril (ZESTRIL) 20 MG tablet Take 0.5 tablets (10 mg total) by mouth daily. 15 tablet 11  . valACYclovir (VALTREX) 1000 MG tablet Take 1 tablet (1,000 mg total) by mouth 3 (three) times daily. 21 tablet 0  . rosuvastatin (CRESTOR) 40 MG tablet Take 1 tablet (40 mg total) by mouth daily. 90 tablet 3   No current facility-administered medications on file prior to visit.    Allergies  Allergen Reactions  . Metformin And Related     Pt feels that this causes constipation and will not take     Family History  Problem Relation Age of Onset  . Diabetes Father   . Hypertension Father   . Heart attack Father   . Stroke Neg Hx     BP 116/80   Pulse 76   Wt 162 lb (73.5 kg)   SpO2 99%   BMI 26.96 kg/m    Review of Systems He denies hypoglycemia    Objective:   Physical Exam VITAL SIGNS:  See vs page GENERAL: no distress   Lab Results  Component Value Date   HGBA1C 8.3 (A) 08/03/2020        Assessment & Plan:  Insulin-requiring type 2 DM, with stage 3 CRI: uncontrolled.  Goal is improved glycemic control, and to phase out the insulin.     Patient Instructions  Please reduce the glipizide to 1/2 pill with breakfast and: Stop taking the acarbose, and: Increase the Levemir insulin to 15 units each morning.  check your blood sugar twice a day.  vary the time of day when you check, between before the 3 meals, and at bedtime.  also check if you have symptoms of your blood sugar being too high or too low.  please keep a record  of the readings and bring it to your next appointment here (or you can bring the meter itself).  You can write it on any piece of paper.  please call us sooner if your blood sugar goes below 70, or if you have a lot of readings over 200. Please come back for a follow-up appointment in 1 month.     ???????????? glipizide ?? 1/2 ??????????????????????????? ???: ???????????? acarbose ???? ????????????????? Levemir ??? 15 ?????????????????? ????????????????????????????????????????????????? ???????????????????????????????????????? ??????????????? 3 ??? ????????????? ?????????????????????????????????????????????????????????????????????? ????????????????????????? ???????????????????????????????????????????? (???????????????????????????????)? ?????????????????????????????? ?????????????????????????? ???????????????????????????????????????? 70 ???????????????????????????????? 200? ???????????????????????????????????????????????? 1 ???

## 2020-09-05 LAB — HM HEPATITIS C SCREENING LAB: HM Hepatitis Screen: NEGATIVE

## 2020-09-07 ENCOUNTER — Ambulatory Visit: Payer: Medicare Other | Admitting: Endocrinology

## 2020-09-07 ENCOUNTER — Encounter: Payer: Self-pay | Admitting: Endocrinology

## 2020-09-07 ENCOUNTER — Other Ambulatory Visit: Payer: Self-pay

## 2020-09-07 VITALS — BP 116/82 | HR 68 | Ht 65.0 in | Wt 164.0 lb

## 2020-09-07 DIAGNOSIS — E118 Type 2 diabetes mellitus with unspecified complications: Secondary | ICD-10-CM

## 2020-09-07 NOTE — Patient Instructions (Addendum)
Please stop taking the glipizide, and:  Please continue the same other medications, including the insulin. check your blood sugar twice a day.  vary the time of day when you check, between before the 3 meals, and at bedtime.  also check if you have symptoms of your blood sugar being too high or too low.  please keep a record of the readings and bring it to your next appointment here (or you can bring the meter itself).  You can write it on any piece of paper.  please call us sooner if your blood sugar goes below 70, or if you have a lot of readings over 200. Please come back for a follow-up appointment in 6 weeks.     ??????????????? glipizide ???? ??????????????????????????? ??????????????????? ????????????????????????????????????????????????? ???????????????????????????????????????? ??????????????? 3 ??? ????????????? ?????????????????????????????????????????????????????????????????????? ????????????????????????? ???????????????????????????????????????????? (???????????????????????????????)? ?????????????????????????????? ?????????????????????????? ???????????????????????????????????????? 70 ???????????????????????????????? 200? ???????????????????????????????????????????????? 6 ????????

## 2020-09-07 NOTE — Progress Notes (Signed)
Subjective:    Patient ID: Barry [name], male    DOB: 1951/01/04, 69 y.o.   MRN: [id number]  HPI Pt returns for f/u of diabetes mellitus:  DM type: Insulin-requiring type 2 Dx'ed: 4270 Complications: PN, stage 3 CRI, PAD, and CAD Therapy: insulin since 2021, and glipizide.   DKA: never Severe hypoglycemia: never Pancreatitis: never Pancreatic imaging: normal on 2019 CT.  Other: he did not tolerate metformin or victoza (abd bloating and diarrhea); he also took Levemir 2016-2018 (he stopped due to edema and constipation).   Interval history: He brings a record of his cbg's which I have reviewed today.  cbg varies from 50-300.  There is no trend throughout the day.  He reports decreased hearing from the right ear Past Medical History:  Diagnosis Date  . CAD (coronary artery disease)    a. LHC (02/26/14):  mLAD 20 (faint L>R collats), mCFX 50, pRCA 95 (plaque rupture) >> PCI:  Xience DES to pRCA (severe tortuosity of R innominate artery >> needs L radial or FA in future);  b. LHC (03/10/14):  CFX 90, oOM 70, pRCA stent patent >> PCI:  Xience DES to mCFX   . Carotid stenosis    a. Carotid US (02/27/14):  Bilateral ICA 1-39%  . Depression   . Diabetes mellitus   . 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[place])    a. Mesenteric Artery Duplex (7/15):  pSMA chronic dissection with aneurysmal dilation of 1.29 cm (VVS);  b.  Chest CTA (02/26/14):  IMPRESSION:  1. No aortic dissection or other acute abnormality.  2. 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  3. Stable 17 mm ectasia of the left common iliac artery.  4. Atherosclerosis, including aortoiliac and coronary artery disease.   Marland Kitchen History of echocardiogram 09/2015   Echo 1/17: mod LVH, EF 55%, inf-lat HK, Gr 1 DD, mild LAE  . Hx of cardiovascular stress test    Lexiscan Myoview (2/16):  Inferior lateral defect c/w thinning vs small prior infarct, no ischemia, EF 49%, Low Risk  . Hx of echocardiogram    a.  Echocardiogram (02/27/14):  Mod focal basal  and mild concentric LVH, EF 50-55%, no RWMA, Gr 1 DD, mild TR, normal RVF  . Hyperlipidemia   . Hypertension   . Iliac artery aneurysm, left ([place])   . Myocardial infarction ([place])   . Stroke [facility]) 09/26/2009    Past Surgical History:  Procedure Laterality Date  . CARDIAC CATHETERIZATION    . CARDIAC CATHETERIZATION N/A 06/25/2015   Procedure: Left Heart Cath and Coronary Angiography;  Surgeon: Burnell Blanks, MD;  Location: [place] CV LAB;  Service: Cardiovascular;  Laterality: N/A;  . COLONOSCOPY WITH PROPOFOL N/A 07/23/2015   Procedure: COLONOSCOPY WITH PROPOFOL;  Surgeon: [name], MD;  Location: [facility] ENDOSCOPY;  Service: Endoscopy;  Laterality: N/A;  . CORONARY ANGIOPLASTY    . LEFT HEART CATHETERIZATION WITH CORONARY ANGIOGRAM N/A 02/26/2014   Procedure: LEFT HEART CATHETERIZATION WITH CORONARY ANGIOGRAM;  Surgeon: Wellington Hampshire, MD;  Location: [place] CATH LAB;  Service: Cardiovascular;  Laterality: N/A;  . LEFT HEART CATHETERIZATION WITH CORONARY ANGIOGRAM N/A 03/10/2014   Procedure: LEFT HEART CATHETERIZATION WITH CORONARY ANGIOGRAM;  Surgeon: Jettie Booze, MD;  Location: [facility] CATH LAB;  Service: Cardiovascular;  Laterality: N/A;    Social History   Socioeconomic History  . Marital status: Married    Spouse name: Not on file  . Number of children: 3  . Years of education: Not on file  .  Highest education level: Not on file  Occupational History  . Occupation: Retired  Tobacco Use  . Smoking status: Former Smoker    Packs/day: 0.25    Years: 38.00    Pack years: 9.50    Types: Cigarettes    Quit date: 2008    Years since quitting: 13.9  . Smokeless tobacco: Never Used  Vaping Use  . Vaping Use: Never used  Substance and Sexual Activity  . Alcohol use: No    Alcohol/week: 0.0 standard drinks  . Drug use: No  . Sexual activity: Yes  Other Topics Concern  . Not on file  Social History Narrative   Marital: married.      Lives: with son,wife.        Children: 3 children; 6 grandchildren      Employed: unemployed; disability unknown reason/CVA L sided weakness      Tobacco:  Quit 2012; smoked 40 years.       Alcohol: no drinking now; social in past.       Drugs: none       Exercise: sporadic.       ADLs:  No driving since CVA.   Social Determinants of Health   Financial Resource Strain: Not on file  Food Insecurity: Not on file  Transportation Needs: Not on file  Physical Activity: Not on file  Stress: Not on file  Social Connections: Not on file  Intimate Partner Violence: Not on file    Current Outpatient Medications on File Prior to Visit  Medication Sig Dispense Refill  . amLODipine (NORVASC) 5 MG tablet Take 1 tablet (5 mg total) by mouth daily. 30 tablet 11  . aspirin EC 81 MG EC tablet Take 1 tablet (81 mg total) by mouth daily.    . blood glucose meter kit and supplies Dispense based on patient and insurance preference. Use up to four times daily as directed. (FOR ICD-10 E10.9, E11.9). 1 each 0  . Blood Glucose Monitoring Suppl w/Device KIT 1 kit by Does not apply route as directed. 1 kit 0  . ezetimibe (ZETIA) 10 MG tablet Take 1 tablet (10 mg total) by mouth daily. 90 tablet 3  . gabapentin (NEURONTIN) 300 MG capsule Take 1 capsule (300 mg total) by mouth at bedtime. 90 capsule 3  . glucose blood (ONE TOUCH ULTRA TEST) test strip Use daily to check glucose- may use up to twice a day. 100 each 3  . insulin detemir (LEVEMIR FLEXTOUCH) 100 UNIT/ML FlexPen Inject 15 Units into the skin every morning. 15 mL 2  . Insulin Pen Needle (TECHLITE PEN NEEDLES) 32G X 8 MM MISC Use daily to administer insulin 100 each 3  . Lancets (ONETOUCH DELICA PLUS [id number]) MISC SMARTSIG:1 Topical Daily    . lisinopril (ZESTRIL) 20 MG tablet Take 0.5 tablets (10 mg total) by mouth daily. 15 tablet 11  . valACYclovir (VALTREX) 1000 MG tablet Take 1 tablet (1,000 mg total) by mouth 3 (three) times daily. 21 tablet 0  . rosuvastatin (CRESTOR) 40  MG tablet Take 1 tablet (40 mg total) by mouth daily. 90 tablet 3   No current facility-administered medications on file prior to visit.    Allergies  Allergen Reactions  . Metformin And Related     Pt feels that this causes constipation and will not take     Family History  Problem Relation Age of Onset  . Diabetes Father   . Hypertension Father   . Heart attack Father   .  Stroke Neg Hx     BP 116/82   Pulse 68   Ht 5' 5"  (1.651 m)   Wt 164 lb (74.4 kg)   SpO2 98%   BMI 27.29 kg/m    Review of Systems     Objective:   Physical Exam VITAL SIGNS:  See vs page GENERAL: no distress Right EAC and TM appear normal Pulses: dorsalis pedis intact bilat.   MSK: no deformity of the feet CV: no leg edema Skin:  no ulcer on the feet.  normal color and temp on the feet. Neuro: sensation is intact to touch on the feet.    outside test results are reviewed: A1c=6.8%     Assessment & Plan:  Insulin-requiring type 2 DM, with PAD. Hypoglycemia, due to insulin and glipizide: this limits aggressiveness of glycemic control. Decreased hearing.  I offered ref audiol.  He declines   Patient Instructions  Please stop taking the glipizide, and:  Please continue the same other medications, including the insulin. check your blood sugar twice a day.  vary the time of day when you check, between before the 3 meals, and at bedtime.  also check if you have symptoms of your blood sugar being too high or too low.  please keep a record of the readings and bring it to your next appointment here (or you can bring the meter itself).  You can write it on any piece of paper.  please call us sooner if your blood sugar goes below 70, or if you have a lot of readings over 200. Please come back for a follow-up appointment in 6 weeks.     ??????????????? glipizide ???? ??????????????????????????? ??????????????????? ?????????????????????????????????????????????????  ???????????????????????????????????????? ??????????????? 3 ??? ????????????? ?????????????????????????????????????????????????????????????????????? ????????????????????????? ???????????????????????????????????????????? (???????????????????????????????)? ?????????????????????????????? ?????????????????????????? ???????????????????????????????????????? 70 ???????????????????????????????? 200? ???????????????????????????????????????????????? 6 ????????

## 2020-09-11 NOTE — Progress Notes (Signed)
[facility] [place] [address], [address] [place], [place] [address] [phone] Fax [phone]  Date:  09/14/2020   Name:  Barry [name]   DOB:  01/17/1951   MRN:  [id number]  PCP:  Barry [name], Barry C, MD    Chief Complaint: Annual Exam   History of Present Illness:  Barry [name] is a 69 y.o. very pleasant male patient who presents with the following:  Patient here today for physical exam- accompanied by his wife and infant grand-daughter"Barry [name]" today.  We used video interpreter for visit today I last saw him in October  History of CAD status post NSTEMI and stenting x2, chronic superior mesenteric artery dissection followed by vascular surgery, PAD, stroke in 2011, diabetes, hypertension, hyperlipidemia, CKD-his care is also complicated by language barrier and some medical noncompliance  His endocrinologist is Dr. Ellison-most recent visit last week; they had him stop glipizide due to symptoms of hypoglycemia, otherwise continue medications.  Confirmed this with pt today  Lab Results  Component Value Date   HGBA1C 8.3 (A) 08/03/2020   Eye exam; not done yet this year  Tetanus vaccine COVID-19 up-to-date, not yet time for booster Flu shot done Shingrix Colon UTD  Concern of left shoulder pain- soreness, mild NKI No CP or SOB He has noted that shoulder pain for about 2 days They did not bring medications with them today  Went over medications with interpreter and encouraged him to take as directed   Lipid, CMP and CBC on chart from August, PSA Patient Active Problem List   Diagnosis Date Noted  . Atherosclerotic peripheral vascular disease with ulceration (HCC) 04/03/2015  . Coronary artery disease involving native coronary artery of native heart without angina pectoris 03/28/2014  . Aneurysm of iliac artery (HCC) 03/18/2014  . Abdominal pain, unspecified site 03/18/2014  . Syncope in setting of nausea and vomiting 03/07/14 03/08/2014  . Non  compliance with medical treatment 02/28/2014  . Dyslipidemia, goal LDL below 70 02/28/2014  . Bradycardia- beta blocker decreased 02/28/2014  . Chronic diastolic CHF (congestive heart failure) (HCC) 02/28/2014  . Type 2 diabetes mellitus with manifestations (HCC) 02/27/2014  . TIA (transient ischemic attack) 02/26/2014  . History of non-ST elevation myocardial infarction (NSTEMI) 02/26/2014  . Weakness-Left arm / Lef 12/06/2013  . Mesenteric artery stenosis (HCC) 12/06/2013  . 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HCC) 12/03/2012  . Hypertension 11/10/2011  . History of CVA in adulthood 11/10/2011  . Language barrier 11/10/2011    Past Medical History:  Diagnosis Date  . CAD (coronary artery disease)    a. LHC (02/26/14):  mLAD 20 (faint L>R collats), mCFX 50, pRCA 95 (plaque rupture) >> PCI:  Xience DES to pRCA (severe tortuosity of R innominate artery >> needs L radial or FA in future);  b. LHC (03/10/14):  CFX 90, oOM 70, pRCA stent patent >> PCI:  Xience DES to mCFX   . Carotid stenosis    a. Carotid US (02/27/14):  Bilateral ICA 1-39%  . Depression   . Diabetes mellitus   . 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HCC)    a. Mesenteric Artery Duplex (7/15):  pSMA chronic dissection with aneurysmal dilation of 1.29 cm (VVS);  b.  Chest CTA (02/26/14):  IMPRESSION:  1. No aortic dissection or other acute abnormality.  2. 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  3. Stable 17 mm ectasia of the left common iliac artery.  4. Atherosclerosis, including aortoiliac and coronary artery disease.   .   History of echocardiogram 09/2015   Echo 1/17: mod LVH, EF 55%, inf-lat HK, Gr 1 DD, mild LAE  . Hx of cardiovascular stress test    Lexiscan Myoview (2/16):  Inferior lateral defect [name]/w thinning vs small prior infarct, no ischemia, EF 49%, Low Risk  . Hx of echocardiogram    a.  Echocardiogram (02/27/14):  Mod focal basal and mild concentric LVH, EF 50-55%, no RWMA, Gr 1 DD, mild TR, normal RVF  .  Hyperlipidemia   . Hypertension   . Iliac artery aneurysm, left (HCC)   . Myocardial infarction (HCC)   . Stroke (HCC) 09/26/2009    Past Surgical History:  Procedure Laterality Date  . CARDIAC CATHETERIZATION    . CARDIAC CATHETERIZATION N/A 06/25/2015   Procedure: Left Heart Cath and Coronary Angiography;  Surgeon: Christopher D McAlhany, MD;  Location: [facility] INVASIVE [facility];  Service: Cardiovascular;  Laterality: N/A;  . COLONOSCOPY WITH PROPOFOL N/A 07/23/2015   Procedure: COLONOSCOPY WITH PROPOFOL;  Surgeon: Daniel P Jacobs, MD;  Location: [facility] ENDOSCOPY;  Service: Endoscopy;  Laterality: N/A;  . CORONARY ANGIOPLASTY    . LEFT HEART CATHETERIZATION WITH CORONARY ANGIOGRAM N/A 02/26/2014   Procedure: LEFT HEART CATHETERIZATION WITH CORONARY ANGIOGRAM;  Surgeon: Muhammad A Arida, MD;  Location: [facility] CATH LAB;  Service: Cardiovascular;  Laterality: N/A;  . LEFT HEART CATHETERIZATION WITH CORONARY ANGIOGRAM N/A 03/10/2014   Procedure: LEFT HEART CATHETERIZATION WITH CORONARY ANGIOGRAM;  Surgeon: Jayadeep S Varanasi, MD;  Location: [facility] CATH [facility];  Service: Cardiovascular;  Laterality: N/A;    Social History   Tobacco Use  . Smoking status: Former Smoker    Packs/day: 0.25    Years: 38.00    Pack years: 9.50    Types: Cigarettes    Quit date: 2008    Years since quitting: 13.9  . Smokeless tobacco: Never Used  Vaping Use  . Vaping Use: Never used  Substance Use Topics  . Alcohol use: No    Alcohol/week: 0.0 standard drinks  . Drug use: No    Family History  Problem Relation Age of Onset  . Diabetes Father   . Hypertension Father   . Heart attack Father   . Stroke Neg Hx     Allergies  Allergen Reactions  . Metformin And Related     Pt feels that this causes constipation and will not take     Medication list has been reviewed and updated.  Current Outpatient Medications on File Prior to Visit  Medication Sig Dispense Refill  . amLODipine (NORVASC) 5 MG tablet Take 1 tablet  (5 mg total) by mouth daily. 30 tablet 11  . aspirin EC 81 MG EC tablet Take 1 tablet (81 mg total) by mouth daily.    . B-D ULTRAFINE III SHORT PEN 31G X 8 MM MISC SMARTSIG:1 Each SUB-Q Daily    . blood glucose meter kit and supplies Dispense based on patient and insurance preference. Use up to four times daily as directed. (FOR ICD-10 E10.9, E11.9). 1 each 0  . Blood Glucose Monitoring Suppl w/Device KIT 1 kit by Does not apply route as directed. 1 kit 0  . glucose blood (ONE TOUCH ULTRA TEST) test strip Use daily to check glucose- may use up to twice a day. 100 each 3  . insulin detemir (LEVEMIR FLEXTOUCH) 100 UNIT/ML FlexPen Inject 15 Units into the skin every morning. 15 mL 2  . Insulin Pen Needle (TECHLITE PEN NEEDLES) 32G X 8 MM MISC Use daily to   administer insulin 100 each 3  . Lancets (ONETOUCH DELICA PLUS LANCET33G) MISC SMARTSIG:1 Topical Daily    . lisinopril (ZESTRIL) 20 MG tablet Take 0.5 tablets (10 mg total) by mouth daily. 15 tablet 11  . valACYclovir (VALTREX) 1000 MG tablet Take 1 tablet (1,000 mg total) by mouth 3 (three) times daily. 21 tablet 0  . ezetimibe (ZETIA) 10 MG tablet Take 1 tablet (10 mg total) by mouth daily. (Patient not taking: Reported on 09/14/2020) 90 tablet 3  . gabapentin (NEURONTIN) 300 MG capsule Take 1 capsule (300 mg total) by mouth at bedtime. (Patient not taking: Reported on 09/14/2020) 90 capsule 3  . rosuvastatin (CRESTOR) 40 MG tablet Take 1 tablet (40 mg total) by mouth daily. 90 tablet 3   No current facility-administered medications on file prior to visit.    Review of Systems:  As per HPI- otherwise negative.   Physical Examination: Vitals:   09/14/20 0908  BP: 126/62  Pulse: 62  Resp: 12  Temp: 98.2 F (36.8 [name])  SpO2: 97%   Vitals:   09/14/20 0908  Weight: 167 lb 12.8 oz (76.1 kg)  Height: 5' 5" (1.651 m)   Body mass index is 27.92 kg/m. Ideal Body Weight: Weight in (lb) to have BMI = 25: 149.9  GEN: no acute distress.   Normal weight, looks well HEENT: Atraumatic, Normocephalic.  Ears and Nose: No external deformity. CV: RRR, No M/G/R. No JVD. No thrill. No extra heart sounds. PULM: CTA B, no wheezes, crackles, rhonchi. No retractions. No resp. distress. No accessory muscle use. ABD: S, NT, ND, +BS. No rebound. No HSM. EXTR: No [name]/[name]/e PSYCH: Normally interactive. Conversant.  He has tenderness over the left anterior shoulder of the rotator cuff tendon insertion.  Normal range of motion of shoulder, he does have some tenderness with Neer's and Hawkins testing Normal strength of both upper extremity   Assessment and Plan: Physical exam  Type 2 diabetes mellitus with complication, without long-term current use of insulin (HCC)  Essential hypertension  Acute pain of left shoulder - Plan: methocarbamol (ROBAXIN) 500 MG tablet  Immunization due  Patient today for a physical exam-interpreter used due to language barrier Overall Mr. Salahuddin is doing well, he is compliant with endocrinology follow-up We went over his health maintenance and immunizations today Advised him that COVID-19 booster will be due in January Encouraged tetanus and shingles vaccines at the pharmacy Prescribe Robaxin for shoulder strain as needed, avoid NSAIDs due to history of stroke and CKD Plan for follow-up in 6 months This visit occurred during the SARS-CoV-2 public health emergency.  Safety protocols were in place, including screening questions prior to the visit, additional usage of staff PPE, and extensive cleaning of exam room while observing appropriate contact time as indicated for disinfecting solutions.    Signed Barry Copland, MD  

## 2020-09-14 ENCOUNTER — Ambulatory Visit (INDEPENDENT_AMBULATORY_CARE_PROVIDER_SITE_OTHER): Payer: Medicare Other | Admitting: Family Medicine

## 2020-09-14 ENCOUNTER — Other Ambulatory Visit: Payer: Self-pay

## 2020-09-14 ENCOUNTER — Encounter: Payer: Self-pay | Admitting: Family Medicine

## 2020-09-14 VITALS — BP 126/62 | HR 62 | Temp 98.2°F | Resp 12 | Ht 65.0 in | Wt 167.8 lb

## 2020-09-14 DIAGNOSIS — Z23 Encounter for immunization: Secondary | ICD-10-CM

## 2020-09-14 DIAGNOSIS — I10 Essential (primary) hypertension: Secondary | ICD-10-CM | POA: Diagnosis not present

## 2020-09-14 DIAGNOSIS — M25512 Pain in left shoulder: Secondary | ICD-10-CM

## 2020-09-14 DIAGNOSIS — Z0000 Encounter for general adult medical examination without abnormal findings: Secondary | ICD-10-CM | POA: Diagnosis not present

## 2020-09-14 DIAGNOSIS — E118 Type 2 diabetes mellitus with unspecified complications: Secondary | ICD-10-CM | POA: Diagnosis not present

## 2020-09-14 DIAGNOSIS — Z789 Other specified health status: Secondary | ICD-10-CM

## 2020-09-14 MED ORDER — METHOCARBAMOL 500 MG PO TABS: 500.0000 mg | ORAL_TABLET | Freq: Three times a day (TID) | ORAL | 0 refills | Status: DC | PRN

## 2020-09-14 NOTE — Patient Instructions (Addendum)
It was good to see you again today-  Please get a covid 19 booster in Mid January- about one month from now I would also recommend that you get a tetanus booster and the shingles vaccine at your pharmacy Please get an eye exam annually  I prescribed robaxin to use as needed for your shoulder pain- please let me know if not feeling better in a few days, Sooner if worse.  You can also use tylenol as needed for pain   Please see me in 6 months    Health Maintenance After Age 21 After age 22, you are at a higher risk for certain long-term diseases and infections as well as injuries from falls. Falls are a major cause of broken bones and head injuries in people who are older than age 67. Getting regular preventive care can help to keep you healthy and well. Preventive care includes getting regular testing and making lifestyle changes as recommended by your health care provider. Talk with your health care provider about:  Which screenings and tests you should have. A screening is a test that checks for a disease when you have no symptoms.  A diet and exercise plan that is right for you. What should I know about screenings and tests to prevent falls? Screening and testing are the best ways to find a health problem early. Early diagnosis and treatment give you the best chance of managing medical conditions that are common after age 41. Certain conditions and lifestyle choices may make you more likely to have a fall. Your health care provider may recommend:  Regular vision checks. Poor vision and conditions such as cataracts can make you more likely to have a fall. If you wear glasses, make sure to get your prescription updated if your vision changes.  Medicine review. Work with your health care provider to regularly review all of the medicines you are taking, including over-the-counter medicines. Ask your health care provider about any side effects that may make you more likely to have a fall. Tell your  health care provider if any medicines that you take make you feel dizzy or sleepy.  Osteoporosis screening. Osteoporosis is a condition that causes the bones to get weaker. This can make the bones weak and cause them to break more easily.  Blood pressure screening. Blood pressure changes and medicines to control blood pressure can make you feel dizzy.  Strength and balance checks. Your health care provider may recommend certain tests to check your strength and balance while standing, walking, or changing positions.  Foot health exam. Foot pain and numbness, as well as not wearing proper footwear, can make you more likely to have a fall.  Depression screening. You may be more likely to have a fall if you have a fear of falling, feel emotionally low, or feel unable to do activities that you used to do.  Alcohol use screening. Using too much alcohol can affect your balance and may make you more likely to have a fall. What actions can I take to lower my risk of falls? General instructions  Talk with your health care provider about your risks for falling. Tell your health care provider if: ? You fall. Be sure to tell your health care provider about all falls, even ones that seem minor. ? You feel dizzy, sleepy, or off-balance.  Take over-the-counter and prescription medicines only as told by your health care provider. These include any supplements.  Eat a healthy diet and maintain a healthy weight.  A healthy diet includes low-fat dairy products, low-fat (lean) meats, and fiber from whole grains, beans, and lots of fruits and vegetables. Home safety  Remove any tripping hazards, such as rugs, cords, and clutter.  Install safety equipment such as grab bars in bathrooms and safety rails on stairs.  Keep rooms and walkways well-lit. Activity   Follow a regular exercise program to stay fit. This will help you maintain your balance. Ask your health care provider what types of exercise are  appropriate for you.  If you need a cane or walker, use it as recommended by your health care provider.  Wear supportive shoes that have nonskid soles. Lifestyle  Do not drink alcohol if your health care provider tells you not to drink.  If you drink alcohol, limit how much you have: ? 0-1 drink a day for women. ? 0-2 drinks a day for men.  Be aware of how much alcohol is in your drink. In the [place] one drink equals one typical bottle of beer (12 oz), one-half glass of wine (5 oz), or one shot of hard liquor (1 oz).  Do not use any products that contain nicotine or tobacco, such as cigarettes and e-cigarettes. If you need help quitting, ask your health care provider. Summary  Having a healthy lifestyle and getting preventive care can help to protect your health and wellness after age 44.  Screening and testing are the best way to find a health problem early and help you avoid having a fall. Early diagnosis and treatment give you the best chance for managing medical conditions that are more common for people who are older than age 43.  Falls are a major cause of broken bones and head injuries in people who are older than age 106. Take precautions to prevent a fall at home.  Work with your health care provider to learn what changes you can make to improve your health and wellness and to prevent falls. This information is not intended to replace advice given to you by your health care provider. Make sure you discuss any questions you have with your health care provider. Document Revised: 01/03/2019 Document Reviewed: 07/26/2017 Elsevier Patient Education  2020 [employer].

## 2020-09-17 ENCOUNTER — Encounter: Payer: Self-pay | Admitting: Family Medicine

## 2020-10-08 ENCOUNTER — Other Ambulatory Visit: Payer: Self-pay

## 2020-10-08 ENCOUNTER — Encounter: Payer: Self-pay | Admitting: Physician Assistant

## 2020-10-08 ENCOUNTER — Ambulatory Visit (INDEPENDENT_AMBULATORY_CARE_PROVIDER_SITE_OTHER): Payer: Medicare Other | Admitting: Physician Assistant

## 2020-10-08 VITALS — BP 100/50 | HR 48 | Ht 65.0 in | Wt 162.0 lb

## 2020-10-08 DIAGNOSIS — R001 Bradycardia, unspecified: Secondary | ICD-10-CM | POA: Diagnosis not present

## 2020-10-08 DIAGNOSIS — E118 Type 2 diabetes mellitus with unspecified complications: Secondary | ICD-10-CM | POA: Diagnosis not present

## 2020-10-08 DIAGNOSIS — I2510 Atherosclerotic heart disease of native coronary artery without angina pectoris: Secondary | ICD-10-CM | POA: Diagnosis not present

## 2020-10-08 DIAGNOSIS — R1084 Generalized abdominal pain: Secondary | ICD-10-CM

## 2020-10-08 DIAGNOSIS — I10 Essential (primary) hypertension: Secondary | ICD-10-CM

## 2020-10-08 DIAGNOSIS — I739 Peripheral vascular disease, unspecified: Secondary | ICD-10-CM | POA: Diagnosis not present

## 2020-10-08 NOTE — Progress Notes (Signed)
Cardiology Office Note:    Date:  10/08/2020   ID:  Barry [name], DOB 01/06/1951, MRN [id number]  PCP:  Darreld Mclean, MD  [facility] Cardiologist:  Sherren Mocha, MD  [place] Electrophysiologist:  None   Chief Complaint: 6 months CAD follow up   History of Present Illness:    Barry [name] is a 70 y.o. male with a hx of CAD s/p DES to RCA and CFX in 2015, chronic superior mesenteric artery dissection followed by Dr. Trula Slade, abdominal aortic penetrating ulcer. prior stroke, DM2, HTN, CKD, HL  presents for follow up.   [place] speaking.   He originally presented in 02/2014 with non-STEMI. LHC demonstrated a ruptured plaque and thrombus in the proximal RCA which was treated with a DES. He was readmitted several weeks later with unstable angina and LHC demonstrated high-grade stenosis in the CFX which was treated with a DES.Lexiscan Myoview in 2/16was low risk with inferior lateral defect consistent with thinning versus small prior infarct. There was no ischemia. EF was 49%. LHC was arranged 9/16 for recurrent chest pain and demonstrated 2v CAD with patent mid LCx/OM2 stent and patent RCA stent, mod stenosis in a small caliber sub-branch of OM2 jailed by the old stent (unchanged from 2015), mod stenosis in a small to mod caliber ostial PDA (unchanged from 2015). Med Rx was recommended.He was seen in the emergency room 7/18 for syncope felt to be related to dehydration.   Last CT angio of abdomen pelvis 10/2017 showed Interval progression of a penetrating aortic ulcer in the distal abdominal aorta now resulting in a short segment non flow limiting chronic dissection.   Patient presented for follow-up with in-house interpreter.  He reports diffuse abdominal pain since last night after he used insuline shot in a while..  No bowel movement today  Denies diarrhea.  No prior constipation history.  No nausea, vomiting, fever, chills, cough and congested.No appetitie today.  He has  received 2 dose of COVID-vaccine but not booster.  No COVID exposure. He denies chest pain, shortness of breath, palpitations,  orthopnea, PND, syncope or lower extremity edema.  He reports intermittent dizziness.  Has not took his medication today.  Past Medical History:  Diagnosis Date  . CAD (coronary artery disease)    a. LHC (02/26/14):  mLAD 20 (faint L>R collats), mCFX 50, pRCA 95 (plaque rupture) >> PCI:  Xience DES to pRCA (severe tortuosity of R innominate artery >> needs L radial or FA in future);  b. LHC (03/10/14):  CFX 90, oOM 70, pRCA stent patent >> PCI:  Xience DES to mCFX   . Carotid stenosis    a. Carotid US (02/27/14):  Bilateral ICA 1-39%  . Depression   . Diabetes mellitus   . 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[place])    a. Mesenteric Artery Duplex (7/15):  pSMA chronic dissection with aneurysmal dilation of 1.29 cm (VVS);  b.  Chest CTA (02/26/14):  IMPRESSION:  1. No aortic dissection or other acute abnormality.  2. 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  3. Stable 17 mm ectasia of the left common iliac artery.  4. Atherosclerosis, including aortoiliac and coronary artery disease.   Marland Kitchen History of echocardiogram 09/2015   Echo 1/17: mod LVH, EF 55%, inf-lat HK, Gr 1 DD, mild LAE  . Hx of cardiovascular stress test    Lexiscan Myoview (2/16):  Inferior lateral defect c/w thinning vs small prior infarct, no ischemia, EF 49%, Low Risk  . Hx of echocardiogram  a.  Echocardiogram (02/27/14):  Mod focal basal and mild concentric LVH, EF 50-55%, no RWMA, Gr 1 DD, mild TR, normal RVF  . Hyperlipidemia   . Hypertension   . Iliac artery aneurysm, left ([place])   . Myocardial infarction ([place])   . Stroke [facility]) 09/26/2009    Past Surgical History:  Procedure Laterality Date  . CARDIAC CATHETERIZATION    . CARDIAC CATHETERIZATION N/A 06/25/2015   Procedure: Left Heart Cath and Coronary Angiography;  Surgeon: Burnell Blanks, MD;  Location: [facility];  Service:  Cardiovascular;  Laterality: N/A;  . COLONOSCOPY WITH PROPOFOL N/A 07/23/2015   Procedure: COLONOSCOPY WITH PROPOFOL;  Surgeon: [name], MD;  Location: [facility] END[facility];  Service: Endoscopy;  Laterality: N/A;  . CORONARY ANGIOPLASTY    . LEFT HEART CATHETERIZATION WITH CORONARY ANGIOGRAM N/A 02/26/2014   Procedure: LEFT HEART CATHETERIZATION WITH CORONARY ANGIOGRAM;  Surgeon: Wellington Hampshire, MD;  Location: [facility] C[facility] LAB;  Service: Cardiovascular;  Laterality: N/A;  . LEFT HEART CATHETERIZATION WITH CORONARY ANGIOGRAM N/A 03/10/2014   Procedure: LEFT HEART CATHETERIZATION WITH CORONARY ANGIOGRAM;  Surgeon: Jettie Booze, MD;  Location: [facility] CATH LAB;  Service: Cardiovascular;  Laterality: N/A;    Current Medications: .hme Current Meds  Medication Sig  . Alirocumab (PRALUENT) 75 MG/ML SOAJ Inject into the skin daily.  Marland Kitchen amLODipine (NORVASC) 5 MG tablet Take 1 tablet (5 mg total) by mouth daily.  Marland Kitchen aspirin EC 81 MG EC tablet Take 1 tablet (81 mg total) by mouth daily.  . B-D ULTRAFINE III SHORT PEN 31G X 8 MM MISC SMARTSIG:1 Each SUB-Q Daily  . blood glucose meter kit and supplies Dispense based on patient and insurance preference. Use up to four times daily as directed. (FOR ICD-10 E10.9, E11.9).  Marland Kitchen Blood Glucose Monitoring Suppl w/Device KIT 1 kit by Does not apply route as directed.  . gabapentin (NEURONTIN) 300 MG capsule Take 1 capsule (300 mg total) by mouth at bedtime.  Marland Kitchen glucose blood (ONE TOUCH ULTRA TEST) test strip Use daily to check glucose- may use up to twice a day.  . insulin detemir (LEVEMIR FLEXTOUCH) 100 UNIT/ML FlexPen Inject 15 Units into the skin every morning.  . insulin glargine (LANTUS) 100 UNIT/ML injection Inject 12 Units into the skin daily.  . Insulin Pen Needle (TECHLITE PEN NEEDLES) 32G X 8 MM MISC Use daily to administer insulin  . Lancets (ONETOUCH DELICA PLUS [id number]) MISC SMARTSIG:1 Topical Daily  . liraglutide (VICTOZA) 18 MG/3ML SOPN Inject 12 mg into  the skin daily in the afternoon.  Marland Kitchen lisinopril (ZESTRIL) 20 MG tablet Take 0.5 tablets (10 mg total) by mouth daily.  . methocarbamol (ROBAXIN) 500 MG tablet Take 1 tablet (500 mg total) by mouth every 8 (eight) hours as needed for muscle spasms. Use as needed for shoulder pain  . rosuvastatin (CRESTOR) 40 MG tablet Take 1 tablet (40 mg total) by mouth daily.     Allergies:   Metformin and related   Social History   Socioeconomic History  . Marital status: Married    Spouse name: Not on file  . Number of children: 3  . Years of education: Not on file  . Highest education level: Not on file  Occupational History  . Occupation: Retired  Tobacco Use  . Smoking status: Former Smoker    Packs/day: 0.25    Years: 38.00    Pack years: 9.50    Types: Cigarettes    Quit date: 2008  Years since quitting: 14.0  . Smokeless tobacco: Never Used  Vaping Use  . Vaping Use: Never used  Substance and Sexual Activity  . Alcohol use: No    Alcohol/week: 0.0 standard drinks  . Drug use: No  . Sexual activity: Yes  Other Topics Concern  . Not on file  Social History Narrative   Marital: married.      Lives: with son,wife.       Children: 3 children; 6 grandchildren      Employed: unemployed; disability unknown reason/CVA L sided weakness      Tobacco:  Quit 2012; smoked 40 years.       Alcohol: no drinking now; social in past.       Drugs: none       Exercise: sporadic.       ADLs:  No driving since CVA.   Social Determinants of Health   Financial Resource Strain: Not on file  Food Insecurity: Not on file  Transportation Needs: Not on file  Physical Activity: Not on file  Stress: Not on file  Social Connections: Not on file     Family History: The patient's family history includes Diabetes in his father; Heart attack in his father; Hypertension in his father. There is no history of Stroke.    ROS:   Please see the history of present illness.    All other systems reviewed  and are negative.   EKGs/Labs/Other Studies Reviewed:    The following studies were reviewed today:  Echo 09/2015 Study Conclusions   - Left ventricle: The cavity size was normal. Wall thickness was  increased in a pattern of moderate LVH. The estimated ejection  fraction was 55%. Inferolateral hypokinesis. Doppler parameters  are consistent with abnormal left ventricular relaxation (grade 1  diastolic dysfunction).  - Aortic valve: There was no stenosis. There was trivial  regurgitation.  - Mitral valve: There was no significant regurgitation.  - Left atrium: The atrium was mildly dilated.  - Right ventricle: The cavity size was normal. Systolic function  was normal.  - Pulmonary arteries: No complete TR doppler jet [place] unable to  estimate PA systolic pressure.  - Systemic veins: IVC poorly visualized.   Impressions:   - Normal LV size with moderate LV hypertrophy. EF 55%.  Inferolateral hypokinesis. Normal RV size and systolic function.  No significant valvular abnormalities.  Left Heart Cath and Coronary Angiography  05/2015    Conclusion   Ost 2nd Mrg to 2nd Mrg lesion with previously placed drug-eluting stent with minimal restenosis.  Lat 2nd Mrg lesion, 80% stenosed. Jailed by old stent. Unchanged from last cath.  Prox Cx lesion, 20% stenosed.  Ost LAD lesion, 30% stenosed.  Prox LAD to Mid LAD lesion, 30% stenosed.  Dist LAD lesion, 30% stenosed.  1st Diag lesion, 30% stenosed.  Ost RCA to Prox RCA lesion, 15% stenosed. The lesion was previously treated with a stent (unknown type) .  Mid RCA to Dist RCA lesion, 20% stenosed.  Dist RCA lesion, 20% stenosed.  Ost RPDA lesion, 60% stenosed. Small to moderate caliber vessel. Unchanged from last cath.  The left ventricular systolic function is normal.   1. Double vessel CAD with patent stent mid Circumflex/OM2 and patent stent RCA 2. Moderate stenosis in the small caliber sub-branch of OM2  where it is jailed by the old stent. This is a small caliber branch vessel. Unchanged in appearance from last cath in 2015.  3. Moderate stenosis in the small to  moderate caliber ostial PDA, unchanged from last cath in 2015.  4. Normal LV function  Recommendations: Continue medical therapy for CAD.    EKG:  EKG is  ordered today.  The ekg ordered today demonstrates SR TWI in leads III and aVF  Recent Labs: 05/04/2020: BUN 21; Creatinine, Ser 1.34; Hemoglobin 13.9; Platelets 133; Potassium 4.3; Sodium 132 05/11/2020: ALT 35  Recent Lipid Panel    Component Value Date/Time   CHOL 92 (L) 05/11/2020 1020   TRIG 130 05/11/2020 1020   HDL 34 (L) 05/11/2020 1020   CHOLHDL 2.7 05/11/2020 1020   CHOLHDL 3 03/19/2018 1003   VLDL 33.6 03/19/2018 1003   LDLCALC 35 05/11/2020 1020   LDLDIRECT 101.0 04/20/2015 0951     Physical Exam:    VS:  BP (!) 100/50   Pulse (!) 48   Ht _0  (1.651 m)   Wt 162 lb (73.5 kg)   SpO2 97%   BMI 26.96 kg/m     Wt Readings from Last 3 Encounters:  10/08/20 162 lb (73.5 kg)  09/14/20 167 lb 12.8 oz (76.1 kg)  09/07/20 164 lb (74.4 kg)     GEN: Well nourished, well developed in no acute distress HEENT: Normal NECK: No JVD; No carotid bruits LYMPHATICS: No lymphadenopathy CARDIAC: RRR, no murmurs, rubs, gallops RESPIRATORY:  Clear to auscultation without rales, wheezing or rhonchi  ABDOMEN: Soft, non-tender, non-distended MUSCULOSKELETAL:  No edema; No deformity  SKIN: Warm and dry NEUROLOGIC:  Alert and oriented x 3 PSYCHIATRIC:  Normal affect   ASSESSMENT AND PLAN:    1. CAD No angina continue aspirin and statin.  2. peripheral vascular disease -Advised to make appointment with vascular  3. abdominal pain -Patient reported diffuse abdominal pain since last night after insulin shot.  He has poor appetite today without bowel movement.  I advised to call PCP for further guidance.  No COVID-like symptoms or exposure.  4.  Hypertension -Blood pressure soft today.  Likely due to poor p.o. intake.  Advised to hold Norvasc today.  He can restart tomorrow if blood pressure  Improves.  5. HLD - Continue statin  6.  PVC Pulse ox reported heart rate of 48.  Noted extra beat on exam.  EKG shows sinus rhythm at rate of 86 bpm.  Prior EKG reviewed showed PVC which was heard on exam.  His heart rate is stable at 70-80s.  No bradycardia , it was error in machine.  Medication Adjustments/Labs and Tests Ordered: Current medicines are reviewed at length with the patient today.  Concerns regarding medicines are outlined above.  Orders Placed This Encounter  Procedures  . EKG 12-Lead   No orders of the defined types were placed in this encounter.   Patient Instructions  Medication Instructions:  Your physician recommends that you continue on your current medications as directed. Please refer to the Current Medication list given to you today.  *If you need a refill on your cardiac medications before your next appointment, please call your pharmacy*   Lab Work: None ordered  If you have labs (blood work) drawn today and your tests are completely normal, you will receive your results only by: Marland Kitchen MyChart Message (if you have MyChart) OR . A paper copy in the mail If you have any lab test that is abnormal or we need to change your treatment, we will call you to review the results.   Testing/Procedures: None ordered   Follow-Up: At [facility], you and your health  needs are our priority.  As part of our continuing mission to provide you with exceptional heart care, we have created designated Provider Care Teams.  These Care Teams include your primary Cardiologist (physician) and Advanced Practice Providers (APPs -  Physician Assistants and Nurse Practitioners) who all work together to provide you with the care you need, when you need it.  We recommend signing up for the patient portal called "MyChart".  Sign up  information is provided on this After Visit Summary.  MyChart is used to connect with patients for Virtual Visits (Telemedicine).  Patients are able to view lab/test results, encounter notes, upcoming appointments, etc.  Non-urgent messages can be sent to your provider as well.   To learn more about what you can do with MyChart, go to [email or web address].    Your next appointment:   6 month(s)  The format for your next appointment:   In Person  Provider:   You may see Sherren Mocha, MD or one of the following Advanced Practice Providers on your designated Care Team:    Richardson Dopp, PA-C  Robbie Lis, [place]    Other Instructions Follow up with your primary care dr about the abdominal pain      Signed, Leanor Kail, [place]  10/08/2020 2:34 PM    [place]

## 2020-10-08 NOTE — Patient Instructions (Addendum)
Medication Instructions:  Your physician recommends that you continue on your current medications as directed. Please refer to the Current Medication list given to you today.  *If you need a refill on your cardiac medications before your next appointment, please call your pharmacy*   Lab Work: None ordered  If you have labs (blood work) drawn today and your tests are completely normal, you will receive your results only by: Marland Kitchen MyChart Message (if you have MyChart) OR . A paper copy in the mail If you have any lab test that is abnormal or we need to change your treatment, we will call you to review the results.   Testing/Procedures: None ordered   Follow-Up: At [facility], you and your health needs are our priority.  As part of our continuing mission to provide you with exceptional heart care, we have created designated Provider Care Teams.  These Care Teams include your primary Cardiologist (physician) and Advanced Practice Providers (APPs -  Physician Assistants and Nurse Practitioners) who all work together to provide you with the care you need, when you need it.  We recommend signing up for the patient portal called "MyChart".  Sign up information is provided on this After Visit Summary.  MyChart is used to connect with patients for Virtual Visits (Telemedicine).  Patients are able to view lab/test results, encounter notes, upcoming appointments, etc.  Non-urgent messages can be sent to your provider as well.   To learn more about what you can do with MyChart, go to [email or web address].    Your next appointment:   6 month(s)  The format for your next appointment:   In Person  Provider:   You may see Sherren Mocha, MD or one of the following Advanced Practice Providers on your designated Care Team:    Richardson Dopp, PA-C  Robbie Lis, [place]    Other Instructions Follow up with your primary care dr about the abdominal pain

## 2020-10-21 ENCOUNTER — Other Ambulatory Visit: Payer: Self-pay

## 2020-10-23 ENCOUNTER — Ambulatory Visit: Payer: Medicare Other | Admitting: Endocrinology

## 2020-10-23 ENCOUNTER — Other Ambulatory Visit: Payer: Self-pay

## 2020-10-23 VITALS — BP 110/60 | HR 82 | Ht 65.0 in | Wt 164.2 lb

## 2020-10-23 DIAGNOSIS — E118 Type 2 diabetes mellitus with unspecified complications: Secondary | ICD-10-CM | POA: Diagnosis not present

## 2020-10-23 LAB — POCT GLYCOSYLATED HEMOGLOBIN (HGB A1C): Hemoglobin A1C: 10.2 % — AB (ref 4.0–5.6)

## 2020-10-23 MED ORDER — INSULIN GLARGINE 100 UNIT/ML ~~LOC~~ SOLN: 22.0000 [IU] | SUBCUTANEOUS | 3 refills | Status: DC

## 2020-10-23 NOTE — Progress Notes (Signed)
Subjective:    Patient ID: Barry [name], male    DOB: [birth year], 70 y.o.   MRN: [id number]  HPI Wife translates: Pt returns for f/u of diabetes mellitus:  DM type: Insulin-requiring type 2 Dx'ed: 3546 Complications: PN, stage 3 CRI, PAD, and CAD Therapy: insulin since 2021, and Victoza.   DKA: never Severe hypoglycemia: never Pancreatitis: never Pancreatic imaging: normal on 2019 CT.  Other: he did not tolerate metformin or victoza (abd bloating and diarrhea); he also took Levemir 2016-2018 (he stopped due to edema and constipation).   Interval history: He brings a record of his cbg's which I have reviewed today.  cbg varies from 175-345.  There is no trend throughout the day.  He takes Lantus 18/d, and PRN Victoza (but he says this causes abd bloating).  Pt says he does not take Levemir.   Past Medical History:  Diagnosis Date  . CAD (coronary artery disease)    a. LHC (02/26/14):  mLAD 20 (faint L>R collats), mCFX 50, pRCA 95 (plaque rupture) >> PCI:  Xience DES to pRCA (severe tortuosity of R innominate artery >> needs L radial or FA in future);  b. LHC (03/10/14):  CFX 90, oOM 70, pRCA stent patent >> PCI:  Xience DES to mCFX   . Carotid stenosis    a. Carotid US (02/27/14):  Bilateral ICA 1-39%  . Depression   . Diabetes mellitus   . 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[place])    a. Mesenteric Artery Duplex (7/15):  pSMA chronic dissection with aneurysmal dilation of 1.29 cm ([facility]);  b.  Chest CTA (02/26/14):  IMPRESSION:  1. No aortic dissection or other acute abnormality.  2. 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  3. Stable 17 mm ectasia of the left common iliac artery.  4. Atherosclerosis, including aortoiliac and coronary artery disease.   Marland Kitchen History of echocardiogram 09/2015   Echo 1/17: mod LVH, EF 55%, inf-lat HK, Gr 1 DD, mild LAE  . Hx of cardiovascular stress test    Lexiscan Myoview (2/16):  Inferior lateral defect c/w thinning vs small prior infarct, no ischemia, EF 49%,  Low Risk  . Hx of echocardiogram    a.  Echocardiogram (02/27/14):  Mod focal basal and mild concentric LVH, EF 50-55%, no RWMA, Gr 1 DD, mild TR, normal RVF  . Hyperlipidemia   . Hypertension   . Iliac artery aneurysm, left ([address])   . Myocardial infarction ([place])   . Stroke [facility]) 09/26/2009    Past Surgical History:  Procedure Laterality Date  . CARDIAC CATHETERIZATION    . CARDIAC CATHETERIZATION N/A 06/25/2015   Procedure: Left Heart Cath and Coronary Angiography;  Surgeon: Burnell Blanks, MD;  Location: [facility] CV LAB;  Service: Cardiovascular;  Laterality: N/A;  . COLONOSCOPY WITH PROPOFOL N/A 07/23/2015   Procedure: COLONOSCOPY WITH PROPOFOL;  Surgeon: [name], MD;  Location: [facility] ENDOSCOPY;  Service: Endoscopy;  Laterality: N/A;  . CORONARY ANGIOPLASTY    . LEFT HEART CATHETERIZATION WITH CORONARY ANGIOGRAM N/A 02/26/2014   Procedure: LEFT HEART CATHETERIZATION WITH CORONARY ANGIOGRAM;  Surgeon: Wellington Hampshire, MD;  Location: [facility] CATH LAB;  Service: Cardiovascular;  Laterality: N/A;  . LEFT HEART CATHETERIZATION WITH CORONARY ANGIOGRAM N/A 03/10/2014   Procedure: LEFT HEART CATHETERIZATION WITH CORONARY ANGIOGRAM;  Surgeon: Jettie Booze, MD;  Location: [facility] CATH LAB;  Service: Cardiovascular;  Laterality: N/A;    Social History   Socioeconomic History  . Marital status: Married    Spouse  name: Not on file  . Number of children: 3  . Years of education: Not on file  . Highest education level: Not on file  Occupational History  . Occupation: Retired  Tobacco Use  . Smoking status: Former Smoker    Packs/day: 0.25    Years: 38.00    Pack years: 9.50    Types: Cigarettes    Quit date: 2008    Years since quitting: 14.0  . Smokeless tobacco: Never Used  Vaping Use  . Vaping Use: Never used  Substance and Sexual Activity  . Alcohol use: No    Alcohol/week: 0.0 standard drinks  . Drug use: No  . Sexual activity: Yes  Other Topics Concern  . Not on  file  Social History Narrative   Marital: married.      Lives: with son,wife.       Children: 3 children; 6 grandchildren      Employed: unemployed; disability unknown reason/CVA L sided weakness      Tobacco:  Quit 2012; smoked 40 years.       Alcohol: no drinking now; social in past.       Drugs: none       Exercise: sporadic.       ADLs:  No driving since CVA.   Social Determinants of Health   Financial Resource Strain: Not on file  Food Insecurity: Not on file  Transportation Needs: Not on file  Physical Activity: Not on file  Stress: Not on file  Social Connections: Not on file  Intimate Partner Violence: Not on file    Current Outpatient Medications on File Prior to Visit  Medication Sig Dispense Refill  . Alirocumab (PRALUENT) 75 MG/ML SOAJ Inject into the skin daily.    Marland Kitchen amLODipine (NORVASC) 5 MG tablet Take 1 tablet (5 mg total) by mouth daily. 30 tablet 11  . aspirin EC 81 MG EC tablet Take 1 tablet (81 mg total) by mouth daily.    . B-D ULTRAFINE III SHORT PEN 31G X 8 MM MISC SMARTSIG:1 Each SUB-Q Daily    . blood glucose meter kit and supplies Dispense based on patient and insurance preference. Use up to four times daily as directed. (FOR ICD-10 E10.9, E11.9). 1 each 0  . Blood Glucose Monitoring Suppl w/Device KIT 1 kit by Does not apply route as directed. 1 kit 0  . gabapentin (NEURONTIN) 300 MG capsule Take 1 capsule (300 mg total) by mouth at bedtime. 90 capsule 3  . glucose blood (ONE TOUCH ULTRA TEST) test strip Use daily to check glucose- may use up to twice a day. 100 each 3  . Insulin Pen Needle (TECHLITE PEN NEEDLES) 32G X 8 MM MISC Use daily to administer insulin 100 each 3  . Lancets (ONETOUCH DELICA PLUS [id number]) MISC SMARTSIG:1 Topical Daily    . lisinopril (ZESTRIL) 20 MG tablet Take 0.5 tablets (10 mg total) by mouth daily. 15 tablet 11  . methocarbamol (ROBAXIN) 500 MG tablet Take 1 tablet (500 mg total) by mouth every 8 (eight) hours as needed for  muscle spasms. Use as needed for shoulder pain 30 tablet 0  . rosuvastatin (CRESTOR) 40 MG tablet Take 1 tablet (40 mg total) by mouth daily. 90 tablet 3   No current facility-administered medications on file prior to visit.    Allergies  Allergen Reactions  . Metformin And Related     Pt feels that this causes constipation and will not take     Family  History  Problem Relation Age of Onset  . Diabetes Father   . Hypertension Father   . Heart attack Father   . Stroke Neg Hx     BP 110/60 (BP Location: Right Arm, Patient Position: Sitting, Cuff Size: Normal)   Pulse 82   Ht _0  (1.651 m)   Wt 164 lb 3.2 oz (74.5 kg)   SpO2 97%   BMI 27.32 kg/m    Review of Systems     Objective:   Physical Exam VITAL SIGNS:  See vs page GENERAL: no distress Pulses: dorsalis pedis intact bilat.   MSK: no deformity of the feet CV: no leg edema Skin:  no ulcer on the feet.  normal color and temp on the feet. Neuro: sensation is intact to touch on the feet  Lab Results  Component Value Date   HGBA1C 10.2 (A) 10/23/2020       Assessment & Plan:  Insulin-requiring type 2 DM, with PAD: uncontrolled.  abd bloating, due to Victoza.    Patient Instructions  I have sent a prescription to your pharmacy, to increase the Lantus to 22 units each morning. Please stop taking the Victoza.   check your blood sugar twice a day.  vary the time of day when you check, between before the 3 meals, and at bedtime.  also check if you have symptoms of your blood sugar being too high or too low.  please keep a record of the readings and bring it to your next appointment here (or you can bring the meter itself).  You can write it on any piece of paper.  please call us sooner if your blood sugar goes below 70, or if you have a lot of readings over 200. Please come back for a follow-up appointment in 1 month.    ???????????????????????????????????????? ???????????? Lantus ??? 22  ?????????????????? ??????????????? Victoza ? ????????????????????????????????????????????????? ???????????????????????????????????????? ??????????????? 3 ??? ????????????? ?????????????????????????????????????????????????????????????????????? ????????????????????????? ???????????????????????????????????????????? (???????????????????????????????)? ?????????????????????????????? ?????????????????????????? ???????????????????????????????????????? 70 ???????????????????????????????? 200? ???????????????????????????????????????????????? 1 ???

## 2020-10-23 NOTE — Patient Instructions (Addendum)
I have sent a prescription to your pharmacy, to increase the Lantus to 22 units each morning. Please stop taking the Victoza.   check your blood sugar twice a day.  vary the time of day when you check, between before the 3 meals, and at bedtime.  also check if you have symptoms of your blood sugar being too high or too low.  please keep a record of the readings and bring it to your next appointment here (or you can bring the meter itself).  You can write it on any piece of paper.  please call us sooner if your blood sugar goes below 70, or if you have a lot of readings over 200. Please come back for a follow-up appointment in 1 month.    ???????????????????????????????????????? ???????????? Lantus ??? 22 ?????????????????? ??????????????? Victoza ? ????????????????????????????????????????????????? ???????????????????????????????????????? ??????????????? 3 ??? ????????????? ?????????????????????????????????????????????????????????????????????? ????????????????????????? ???????????????????????????????????????????? (???????????????????????????????)? ?????????????????????????????? ?????????????????????????? ???????????????????????????????????????? 70 ???????????????????????????????? 200? ???????????????????????????????????????????????? 1 ???

## 2020-10-25 ENCOUNTER — Other Ambulatory Visit: Payer: Self-pay | Admitting: Endocrinology

## 2020-10-25 DIAGNOSIS — E118 Type 2 diabetes mellitus with unspecified complications: Secondary | ICD-10-CM

## 2020-11-05 ENCOUNTER — Encounter: Payer: Self-pay | Admitting: Gastroenterology

## 2020-12-12 ENCOUNTER — Other Ambulatory Visit: Payer: Self-pay | Admitting: Physician Assistant

## 2020-12-16 ENCOUNTER — Telehealth: Payer: Self-pay | Admitting: Family Medicine

## 2020-12-16 DIAGNOSIS — Z1211 Encounter for screening for malignant neoplasm of colon: Secondary | ICD-10-CM

## 2020-12-16 NOTE — Telephone Encounter (Signed)
Referral to GI for colonoscopy placed

## 2020-12-16 NOTE — Telephone Encounter (Signed)
Patient son would like a referral for a colonoscopy

## 2020-12-29 ENCOUNTER — Other Ambulatory Visit: Payer: Self-pay | Admitting: Physician Assistant

## 2021-01-29 ENCOUNTER — Ambulatory Visit: Payer: Medicare Other | Admitting: Gastroenterology

## 2021-02-23 ENCOUNTER — Other Ambulatory Visit: Payer: Self-pay | Admitting: Physician Assistant

## 2021-04-05 ENCOUNTER — Other Ambulatory Visit: Payer: Self-pay | Admitting: Family Medicine

## 2021-04-05 DIAGNOSIS — E118 Type 2 diabetes mellitus with unspecified complications: Secondary | ICD-10-CM

## 2021-04-19 ENCOUNTER — Other Ambulatory Visit: Payer: Self-pay | Admitting: Physician Assistant

## 2021-05-14 ENCOUNTER — Telehealth: Payer: Self-pay | Admitting: *Deleted

## 2021-05-14 NOTE — Chronic Care Management (AMB) (Signed)
  Chronic Care Management   Outreach Note  05/14/2021 Name: Barry [name] MRN: [id number] DOB: Jun 05, 1951  Barry [name] is a 70 y.o. year old male who is a primary care patient of Copland, Gay Filler, MD. I reached out to Barry [name] by phone today in response to a referral sent by Barry [name] PCP, Copland, Gay Filler, MD      An unsuccessful telephone outreach was attempted today using [place] interpreter services [phone] named Barry [name]. The patient was referred to the case management team for assistance with care management and care coordination.   Follow Up Plan: A HIPAA compliant phone message was left for the patient providing contact information and requesting a return call. The care management team will reach out to the patient again over the next 7 days.  If patient returns call to provider office, please advise to call [place] at [phone].  [facility]  Direct Dial: [phone]

## 2021-05-21 NOTE — Chronic Care Management (AMB) (Signed)
  Chronic Care Management   Note  05/21/2021 Name: Barry [name] MRN: [id number] DOB: [birth year]  Barry [name] is a 70 y.o. year old male who is a primary care patient of Copland, Gay Filler, MD. I reached out to [facility] by phone today in response to a referral sent by [name]'s PCP, Copland, Gay Filler, MD      [name] was given information about Chronic Care Management services today including:  CCM service includes personalized support from designated clinical staff supervised by his physician, including individualized plan of care and coordination with other care providers 24/7 contact phone numbers for assistance for urgent and routine care needs. Service will only be billed when office clinical staff spend 20 minutes or more in a month to coordinate care. Only one practitioner may furnish and bill the service in a calendar month. The patient may stop CCM services at any time (effective at the end of the month) by phone call to the office staff. The patient will be responsible for cost sharing (co-pay) of up to 20% of the service fee (after annual deductible is met).  [name] DPR on file  verbally agreed to assistance and services provided by embedded care coordination/care management team today.  Follow up plan: Telephone appointment with care management team member scheduled for:05/28/2021  Teller [place]  Care Guide, Embedded Care Coordination [place]  Care Management  Direct Dial: [phone]

## 2021-05-27 ENCOUNTER — Telehealth: Payer: Self-pay | Admitting: Pharmacist

## 2021-05-27 NOTE — Chronic Care Management (AMB) (Signed)
Chronic Care Management Pharmacy Assistant   Name: Barry [name]  MRN: [id number] DOB: 1950/11/29  Barry [name] is an 70 y.o. year old male who presents for his initial CCM visit with the clinical pharmacist.   Recent office visits:  09/14/20-Jessica C. Copland, MD (PCP) Seen for annual exam. Start on : methocarbamol (ROBAXIN) 500 MG tablet for left shoulder. Follow up 6 months. 07/23/20-Jessica C. Copland, MD (PCP) Seen for Hyperglycemia. Flu vaccine and pneumonia vaccine today. Labs ordered.Follow up in 6 months.  Recent consult visits:  10/23/20-(Endocrinology) Renato Shin, MD. Diabetic follow up. Increase the Lantus to 22 units each morning. Please stop taking the Victoza. Follow up in 1 month. 10/09/19-(Cardiology) Bing Matter, PA. Seen for 6 month CAD follow up. Advised to hold Norvasc today. He can restart tomorrow if blood pressure  Improves. EKG completed. Follow up in 6 months. 09/07/20-(Endocrinology) Renato Shin, MD. Diabetic follow up. Please stop taking the glipizide. Follow up in 6 weeks. 08/03/20-(Endocrinology) Renato Shin, MD. Diabetic follow up. reduce the glipizide to 1/2 pill with breakfast and Stop taking the acarbose, and Increase the Levemir insulin to 15 units each morning. Follow up in 1 month. 06/18/20-(Endocrinology) Renato Shin, MD. Diabetic follow up. Start on acarbose 25 mg Take 1 tablet (25 mg total) by mouth 3 (three) times daily with meals. Follow up in 1 month.  Hospital visits:  None in previous 6 months  Medications: Outpatient Encounter Medications as of 05/27/2021  Medication Sig   Alirocumab (PRALUENT) 75 MG/ML SOAJ Inject into the skin daily.   amLODipine (NORVASC) 5 MG tablet Take 1 tablet by mouth once daily   aspirin EC 81 MG EC tablet Take 1 tablet (81 mg total) by mouth daily.   B-D ULTRAFINE III SHORT PEN 31G X 8 MM MISC SMARTSIG:1 Each SUB-Q Daily   blood glucose meter kit and supplies Dispense based on patient and insurance preference.  Use up to four times daily as directed. (FOR ICD-10 E10.9, E11.9).   Blood Glucose Monitoring Suppl w/Device KIT 1 kit by Does not apply route as directed.   gabapentin (NEURONTIN) 300 MG capsule Take 1 capsule (300 mg total) by mouth at bedtime.   insulin glargine (LANTUS) 100 UNIT/ML injection Inject 0.22 mLs (22 Units total) into the skin every morning. And pen needles 1/day   Insulin Pen Needle (TECHLITE PEN NEEDLES) 32G X 8 MM MISC Use daily to administer insulin   Lancets (ONETOUCH DELICA PLUS [id number]) MISC SMARTSIG:1 Topical Daily   lisinopril (ZESTRIL) 20 MG tablet Take 1 tablet by mouth once daily   methocarbamol (ROBAXIN) 500 MG tablet Take 1 tablet (500 mg total) by mouth every 8 (eight) hours as needed for muscle spasms. Use as needed for shoulder pain   ONETOUCH ULTRA test strip USE 1 STRIP TO CHECK GLUCOSE UP TO TWICE DAILY   rosuvastatin (CRESTOR) 40 MG tablet Take 1 tablet by mouth once daily   No facility-administered encounter medications on file as of 05/27/2021.   Alirocumab (PRALUENT) 75 MG/ML SOAJ Last filled:02/26/20 84 DS AmLODipine (NORVASC) 5 MG tablet Last filled:02/28/21 30 DS Aspirin EC 81 MG EC tablet Last filled:None noted Gabapentin (NEURONTIN) 300 MG capsule Last filled:08/28/18 90 DS Insulin glargine (LANTUS) 100 UNIT/ML injection Last filled:02/15/21 90 DS Lisinopril (ZESTRIL) 20 MG tablet Last filled:02/24/21 90 DS Methocarbamol (ROBAXIN) 500 MG tablet Last filled:09/14/20 10 DS Rosuvastatin (CRESTOR) 40 MG tablet Last filled:12/14/20 90 DS   Star Rating Drugs: Lisinopril (ZESTRIL) 20 MG tablet Last filled:02/24/21 90 DS  Rosuvastatin (CRESTOR) 40 MG tablet Last filled:12/14/20 90 DS Insulin glargine (LANTUS) 100 UNIT/ML injection Last filled:02/15/21 90 DS  I called his local pharmacy about his last filled dates for his Lantus and Rosuvastatin to confirm that they are the correct fill dates.  Corrie Mckusick, [place]

## 2021-05-28 ENCOUNTER — Ambulatory Visit (INDEPENDENT_AMBULATORY_CARE_PROVIDER_SITE_OTHER): Payer: Medicare Other | Admitting: Pharmacist

## 2021-05-28 DIAGNOSIS — Z789 Other specified health status: Secondary | ICD-10-CM

## 2021-05-28 DIAGNOSIS — E785 Hyperlipidemia, unspecified: Secondary | ICD-10-CM

## 2021-05-28 DIAGNOSIS — I10 Essential (primary) hypertension: Secondary | ICD-10-CM

## 2021-05-28 DIAGNOSIS — E118 Type 2 diabetes mellitus with unspecified complications: Secondary | ICD-10-CM

## 2021-05-28 NOTE — Chronic Care Management (AMB) (Signed)
Chronic Care Management Pharmacy Note  05/28/2021 Name:  Barry [name] MRN:  [id number] DOB:  1951-04-01  Summary: Refill history show low adherence but was difficult to assess today because son did not have medications to review.   Recommendations/Changes made from today's visit: Made appt with PCP 06/07/2021 to have labs and for follow up. Will try to see patient before or after appt with Dr Lorelei Pont to review medications. Son was advised to bring all medications to appointment.    Subjective: Barry [name] is an 70 y.o. year old male who is a primary patient of Copland, Gay Filler, MD.  The CCM team was consulted for assistance with disease management and care coordination needs.    Engaged with patients son by phone  for initial visit in response to provider referral for pharmacy case management and/or care coordination services.   Consent to Services:  The patient was given the following information about Chronic Care Management services today, agreed to services, and gave verbal consent: 1. CCM service includes personalized support from designated clinical staff supervised by the primary care provider, including individualized plan of care and coordination with other care providers 2. 24/7 contact phone numbers for assistance for urgent and routine care needs. 3. Service will only be billed when office clinical staff spend 20 minutes or more in a month to coordinate care. 4. Only one practitioner may furnish and bill the service in a calendar month. 5.The patient may stop CCM services at any time (effective at the end of the month) by phone call to the office staff. 6. The patient will be responsible for cost sharing (co-pay) of up to 20% of the service fee (after annual deductible is met). Patient agreed to services and consent obtained.  Patient Care Team: Copland, Gay Filler, MD as PCP - General (Family Medicine) Sherren Mocha, MD as PCP - Cardiology (Cardiology) Elayne Snare, MD as  Consulting Physician (Endocrinology) Sharmon Revere as Physician Assistant (Cardiology) Cherre Robins, PharmD (Pharmacist)  Recent office visits: 09/14/20-Jessica C. Copland, MD (PCP) Seen for annual exam. Start on : methocarbamol (ROBAXIN) 500 MG tablet for left shoulder. Follow up 6 months.   Recent consult visits: 10/23/20-(Endocrinology) Renato Shin, MD. Diabetic follow up. Increase the Lantus to 22 units each morning. Please stop taking the Victoza. Follow up in 1 month. 10/09/19-(Cardiology) Bing Matter, PA. Seen for 6 month CAD follow up. Advised to hold Norvasc today. He can restart tomorrow if blood pressure  Improves. EKG completed. Follow up in 6 months. 09/07/20-(Endocrinology) Renato Shin, MD. Diabetic follow up. Please stop taking the glipizide. Follow up in 6 weeks.   Hospital visits:  None in previous 6 months   Objective:  Lab Results  Component Value Date   CREATININE 1.34 (H) 05/04/2020   CREATININE 1.32 (H) 01/10/2020   CREATININE 1.10 12/19/2019    Lab Results  Component Value Date   HGBA1C 10.2 (A) 10/23/2020   Last diabetic Eye exam: No results found for: HMDIABEYEEXA  Last diabetic Foot exam: No results found for: HMDIABFOOTEX      Component Value Date/Time   CHOL 92 (L) 05/11/2020 1020   TRIG 130 05/11/2020 1020   HDL 34 (L) 05/11/2020 1020   CHOLHDL 2.7 05/11/2020 1020   CHOLHDL 3 03/19/2018 1003   VLDL 33.6 03/19/2018 1003   LDLCALC 35 05/11/2020 1020   LDLDIRECT 101.0 04/20/2015 0951    Hepatic Function Latest Ref Rng & Units 05/11/2020 12/19/2019 11/21/2019  Total Protein 6.0 - 8.5  g/dL 6.5 6.7 6.8  Albumin 3.8 - 4.8 g/dL 4.1 3.7 4.4  AST 0 - 40 IU/L 22 20 19   ALT 0 - 44 IU/L 35 20 18  Alk Phosphatase 48 - 121 IU/L 66 92 94  Total Bilirubin 0.0 - 1.2 mg/dL 0.4 1.2 0.4  Bilirubin, Direct 0.00 - 0.40 mg/dL 0.13 - 0.12    Lab Results  Component Value Date/Time   TSH 1.34 06/07/2017 09:51 AM    CBC Latest Ref Rng & Units  05/04/2020 12/19/2019 12/19/2019  WBC 4.0 - 10.5 K/uL 7.5 - 6.1  Hemoglobin 13.0 - 17.0 g/dL 13.9 15.0 14.5  Hematocrit 39.0 - 52.0 % 43.7 44.0 45.4  Platelets 150 - 400 K/uL 133(L) - 153    Lab Results  Component Value Date/Time   VD25OH 29 (L) 07/01/2015 09:00 AM    Clinical ASCVD: Yes  The ASCVD Risk score Mikey Bussing DC Jr., et al., 2013) failed to calculate for the following reasons:   The valid total cholesterol range is 130 to 320 mg/dL     Social History   Tobacco Use  Smoking Status Former   Packs/day: 0.25   Years: 38.00   Pack years: 9.50   Types: Cigarettes   Quit date: 2008   Years since quitting: 14.6  Smokeless Tobacco Never   BP Readings from Last 3 Encounters:  10/23/20 110/60  10/08/20 (!) 100/50  09/14/20 126/62   Pulse Readings from Last 3 Encounters:  10/23/20 82  10/08/20 (!) 48  09/14/20 62   Wt Readings from Last 3 Encounters:  10/23/20 164 lb 3.2 oz (74.5 kg)  10/08/20 162 lb (73.5 kg)  09/14/20 167 lb 12.8 oz (76.1 kg)    Assessment: Review of patient past medical history, allergies, medications, health status, including review of consultants reports, laboratory and other test data, was performed as part of comprehensive evaluation and provision of chronic care management services.   SDOH:  (Social Determinants of Health) assessments and interventions performed:  SDOH Interventions    Flowsheet Row Most Recent Value  SDOH Interventions   Financial Strain Interventions Intervention Not Indicated       CCM Care Plan  Allergies  Allergen Reactions   Metformin And Related     Pt feels that this causes constipation and will not take     Medications Reviewed Today     Reviewed by Cherre Robins, PharmD (Pharmacist) on 05/28/21 at 1548  Med List Status: <None>   Medication Order Taking? Sig Documenting Provider Last Dose Status Informant  Alirocumab (PRALUENT) 75 MG/ML SOAJ [id number] No Inject into the skin daily. [provider]  Unknown Active Child  amLODipine (NORVASC) 5 MG tablet [id number] Yes Take 1 tablet by mouth once daily Richardson Dopp T, PA-C Taking Active Child  aspirin EC 81 MG EC tablet [id number] Yes Take 1 tablet (81 mg total) by mouth daily. Geradine Girt, DO Taking Active Child  B-D ULTRAFINE III SHORT PEN 31G X 8 MM MISC [id number] Yes SMARTSIG:1 Each SUB-Q Daily [provider] Taking Active Child  blood glucose meter kit and supplies [id number] Yes Dispense based on patient and insurance preference. Use up to four times daily as directed. (FOR ICD-10 E10.9, E11.9). Copland, Gay Filler, MD Taking Active Child  Blood Glucose Monitoring Suppl w/Device KIT [id number] Yes 1 kit by Does not apply route as directed. Sherren Mocha, MD Taking Active Child  gabapentin (NEURONTIN) 300 MG capsule [id number] Yes Take 1 capsule (300 mg total) by mouth at  bedtime. Copland, Gay Filler, MD Taking Active Child  insulin glargine (LANTUS) 100 UNIT/ML injection [id number] Yes Inject 0.22 mLs (22 Units total) into the skin every morning. And pen needles 1/day Renato Shin, MD Taking Active Child  Insulin Pen Needle (TECHLITE PEN NEEDLES) 32G X 8 MM MISC [id number] Yes Use daily to administer insulin Renato Shin, MD Taking Active Child  Lancets (ONETOUCH DELICA PLUS DUKGUR42H) [place] [id number] Yes SMARTSIG:1 Topical Daily [provider] Taking Active Child  lisinopril (ZESTRIL) 20 MG tablet [id number] Yes Take 1 tablet by mouth once daily Sherren Mocha, MD Taking Active Child  methocarbamol (ROBAXIN) 500 MG tablet [id number] No Take 1 tablet (500 mg total) by mouth every 8 (eight) hours as needed for muscle spasms. Use as needed for shoulder pain Copland, Gay Filler, MD Unknown Active Child  ONETOUCH ULTRA test strip [id number] Yes USE 1 STRIP TO CHECK GLUCOSE UP TO TWICE DAILY Copland, Gay Filler, MD Taking Active Child  rosuvastatin (CRESTOR) 40 MG tablet [id number] No Take 1 tablet by mouth once daily Richardson Dopp T,  PA-C Unknown Active Child            Patient Active Problem List   Diagnosis Date Noted   Atherosclerotic peripheral vascular disease with ulceration ([place]) 04/03/2015   Coronary artery disease involving native coronary artery of native heart without angina pectoris 03/28/2014   Aneurysm of iliac artery ([place]) 03/18/2014   Abdominal pain, unspecified site 03/18/2014   Syncope in setting of nausea and vomiting 03/07/14 03/08/2014   Non compliance with medical treatment 02/28/2014   Dyslipidemia, goal LDL below 70 02/28/2014   Bradycardia- beta blocker decreased 02/28/2014   Chronic diastolic CHF (congestive heart failure) ([place]) 02/28/2014   Type 2 diabetes mellitus with manifestations ([place]) 02/27/2014   TIA (transient ischemic attack) 02/26/2014   History of non-ST elevation myocardial infarction (NSTEMI) 02/26/2014   Weakness-Left arm / [place] 12/06/2013   Mesenteric artery stenosis (H[facility]) 12/06/2013   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[place]) 12/03/2012   Hypertension 11/10/2011   History of CVA in adulthood 11/10/2011   Language barrier 11/10/2011    Immunization History  Administered Date(s) Administered   Fluad Quad(high Dose 65+) 07/23/2020   Influenza, High Dose Seasonal PF 11/15/2017, 06/20/2018   PFIZER(Purple Top)SARS-COV-2 Vaccination 02/18/2020, 04/06/2020   Pneumococcal Conjugate-13 11/15/2017   Pneumococcal Polysaccharide-23 02/28/2014, 07/23/2020    Conditions to be addressed/monitored: CAD, HTN, HLD, DMII, and PVD, history of CVA  Care Plan : General Pharmacy (Adult)  Updates made by Cherre Robins, PHARMD since 05/28/2021 12:00 AM     Problem: Chronic Care and medication management for: type 2 diabetes, hyperlipidemia; hypertension; CAD with history of CVA and PVD      Long-Range Goal: Provide education and support for medication management and disease state management.   Start Date: 05/28/2021  Priority: High  Note:   Current Barriers:  Unable to independently  monitor therapeutic efficacy Unable to achieve control of type 2 diabetes  Does not adhere to prescribed medication regimen Does not maintain contact with provider office Does not contact provider office for questions/concerns  Pharmacist Clinical Goal(s):  Over the next 90 days, patient will achieve adherence to monitoring guidelines and medication adherence to achieve therapeutic efficacy achieve control of type 2 diabetes as evidenced by A1c < 7.5% maintain control of blood pressure and hyperlipidemia  as evidenced by blood pressure <140/90 and LDL <70  adhere to prescribed medication regimen as evidenced by refill history contact provider office for questions/concerns as evidenced notation  of same in electronic health record through collaboration with PharmD and provider.   Interventions: 1:1 collaboration with Copland, Gay Filler, MD regarding development and update of comprehensive plan of care as evidenced by provider attestation and co-signature Inter-disciplinary care team collaboration (see longitudinal plan of care) Comprehensive medication review performed; medication list updated in electronic medical record  Diabetes: Uncontrolled per last A1c form 10/23/2020 which was 10.2% Current treatment: Lantus 22 units daily Current glucose readings: son just reported that BG was better but was not with patient [place] he did not know an exact numbers.  Son reports that patient feel that he is taking too much medication and that blood glucose is getting too low.  Current meal patterns: unable to assess Previous medications tried: glipizide, Victoza Interventions:  Reviewed home blood glucose goals  Fasting blood glucose goal (before meals) = 80 to 130 Blood glucose goal after a meal = less than 180  Review signs and symptoms of low blood glucose (less than 70) and how to treat If patient is having symptoms should check BG, if less than 70, you should eat 15 grams of carbohydrates (4 oz  of juice, soda, 4 glucose tablets, or 3-4 pieces of hard candy). Wait 15 minutes and then recheck your blood sugar. If your sugar is still less than 70, eat another 15 grams of carbohydrates. Wait another 15 minutes and recheck your glucose. Continue this until your sugar is over 70. Then, eat a snack with protein in it to prevent your sugar from dropping again. Contact provider if blood glucose is less than 70 more than once per week.  Made appointment for follow up and labs with PCP for 06/07/2021 at 1:40pm  Hypertension: Recent office BP reading have been at goal (with 1 reading being low)  BP Readings from Last 3 Encounters:  10/23/20 110/60  10/08/20 (!) 100/50  09/14/20 126/62  Current treatment:  Lisinopril 9m daily Current home readings: none reported Interventions:  Reminded that refill for lisinopril is due soon Reviewed blood pressure goal  Hyperlipidemia / CAD / PVD / history of CVA Controlled - last LDL was 36 (05/11/2020) Current treatment: Rosuvastatin 471mdaily (last filled 12/14/2020) Praluent 7554m inject into skin every 14 days. (Son was not sure if patient was still taking this or not) Current dietary patterns: unable to assess today Interventions:  Patient is due to have lipids rechecked Reviewed medication refill history and neither of the above meds have been filled on time.  Discussed adherence with patient's son. Advised to bring all medications to appt 06/07/2021  Medication Management:  Current pharmacy: [facility] Interventions:  Reviewed refill history Identified inconsistencies in refills Advised son to bring in medications for his father's appointment 06/07/21 [place] we can verify exactly what he is taking.   Patient Goals/Self-Care Activities Over the next 90 days, patient will:  take medications as prescribed, focus on medication adherence by using reminder boxes if needed, check glucose 1 to 2 times a day, document, and provide at future appointments,  and keep appointments with medical providers.   Follow Up Plan: Face to Face appointment with care management team member scheduled for: 06/07/2021  Appointment with PCP scheduled for 06/07/2021      Medication Assistance: None required.  Patient affirms current coverage meets needs.  [facility] [id number] [place] [id number]: [phone]: [phone] Up:  Patient  agrees to Care Plan and Follow-up.  Plan: Face to Face appointment with care management team member scheduled for: 06/07/2021 - to see PCP 06/07/2021 (appt made today)  Cherre Robins, PharmD Clinical Pharmacist [place]

## 2021-05-28 NOTE — Patient Instructions (Signed)
Visit Information  PATIENT GOALS:  Goals Addressed             This Visit's Progress    Chronic Care Management - Pharmacy Care Plan       Current Barriers:  Unable to independently monitor therapeutic efficacy Unable to achieve control of type 2 diabetes  Does not adhere to prescribed medication regimen Does not maintain contact with provider office Does not contact provider office for questions/concerns  Pharmacist Clinical Goal(s):  Over the next 90 days, patient will achieve adherence to monitoring guidelines and medication adherence to achieve therapeutic efficacy achieve control of type 2 diabetes as evidenced by A1c < 7.5% maintain control of blood pressure and hyperlipidemia  as evidenced by blood pressure <140/90 and LDL <70  adhere to prescribed medication regimen as evidenced by refill history contact provider office for questions/concerns as evidenced notation of same in electronic health record through collaboration with PharmD and provider.   Interventions: 1:1 collaboration with Copland, Gay Filler, MD regarding development and update of comprehensive plan of care as evidenced by provider attestation and co-signature Inter-disciplinary care team collaboration (see longitudinal plan of care) Comprehensive medication review performed; medication list updated in electronic medical record  Diabetes: Lab Results  Component Value Date   HGBA1C 10.2 (A) 10/23/2020  Current treatment: Lantus 22 units daily Interventions:  Check blood glucose 1 to 2 times a day and record Reviewed home blood glucose goals  Fasting blood glucose goal (before meals) = 80 to 130 Blood glucose goal after a meal = less than 180  Review signs and symptoms of low blood glucose (less than 70) and how to treat If patient is having symptoms should check blood glucose, if less than 70, you should eat 15 grams of carbohydrates (4 oz of juice, soda, 4 glucose tablets, or 3-4 pieces of hard candy).  Wait 15 minutes and then recheck your blood sugar. If your sugar is still less than 70, eat another 15 grams of carbohydrates. Wait another 15 minutes and recheck your glucose. Continue this until your sugar is over 70. Then, eat a snack with protein in it to prevent your sugar from dropping again. Contact provider if blood glucose is less than 70 more than once per week.  Made appointment for follow up and labs with PCP for 06/07/2021 at 1:40pm  Hypertension: Recent office BP reading have been at goal (with 1 reading being low)  BP Readings from Last 3 Encounters:  10/23/20 110/60  10/08/20 (!) 100/50  09/14/20 126/62  Current treatment:  Lisinopril '20mg'$  daily Interventions:  Reminded that refill for lisinopril is due soon Reviewed blood pressure goal  Hyperlipidemia / CAD / PVD / history of CVA Controlled - last LDL was 36 (05/11/2020) Current treatment: Rosuvastatin '40mg'$  daily (last filled 12/14/2020) Praluent '75mg'$  - inject into skin every 14 days. (Son was not sure if patient was still taking this or not) Interventions:  Patient is due to have lipids rechecked Reviewed medication refill history and neither of the above meds have been filled on time.  Discussed adherence with patient's son. Advised to bring all medications to appt 06/07/2021  Medication Management:  Current pharmacy: [facility] Interventions:  Reviewed refill history Advised son to bring in medications for his father's appointment 06/07/21 [place] we can verify exactly what he is taking.   Patient Goals/Self-Care Activities Over the next 90 days, patient will: take medications as prescribed,  focus on medication adherence by using reminder boxes if needed,  check glucose  1 to 2 times a day, document, and provide at future appointments, and  keep appointments with medical providers.   Follow Up Plan: Face to Face appointment with care management team member scheduled for: 06/07/2021 Appointment with PCP scheduled for  06/07/2021        The patient verbalized understanding of instructions, educational materials, and care plan provided today and declined offer to receive copy of patient instructions, educational materials, and care plan.   Cherre Robins, PharmD Clinical Pharmacist [facility]

## 2021-05-31 ENCOUNTER — Other Ambulatory Visit: Payer: Self-pay | Admitting: Family Medicine

## 2021-05-31 DIAGNOSIS — E118 Type 2 diabetes mellitus with unspecified complications: Secondary | ICD-10-CM
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
## 2021-06-07 ENCOUNTER — Encounter: Payer: Self-pay | Admitting: Family Medicine

## 2021-06-07 ENCOUNTER — Other Ambulatory Visit: Payer: Self-pay | Admitting: Family Medicine

## 2021-06-07 ENCOUNTER — Ambulatory Visit: Payer: Medicare Other | Admitting: Pharmacist

## 2021-06-07 ENCOUNTER — Other Ambulatory Visit: Payer: Self-pay

## 2021-06-07 ENCOUNTER — Ambulatory Visit (INDEPENDENT_AMBULATORY_CARE_PROVIDER_SITE_OTHER): Payer: Medicare Other | Admitting: Family Medicine

## 2021-06-07 VITALS — BP 142/82 | HR 98 | Resp 17 | Ht 65.0 in | Wt 160.0 lb

## 2021-06-07 DIAGNOSIS — Z23 Encounter for immunization: Secondary | ICD-10-CM

## 2021-06-07 DIAGNOSIS — E118 Type 2 diabetes mellitus with unspecified complications: Secondary | ICD-10-CM

## 2021-06-07 DIAGNOSIS — S61402A Unspecified open wound of left hand, initial encounter: Secondary | ICD-10-CM | POA: Diagnosis not present

## 2021-06-07 DIAGNOSIS — M25512 Pain in left shoulder: Secondary | ICD-10-CM | POA: Diagnosis not present

## 2021-06-07 DIAGNOSIS — G8929 Other chronic pain: Secondary | ICD-10-CM

## 2021-06-07 DIAGNOSIS — I10 Essential (primary) hypertension: Secondary | ICD-10-CM

## 2021-06-07 DIAGNOSIS — Z789 Other specified health status: Secondary | ICD-10-CM | POA: Diagnosis not present

## 2021-06-07 DIAGNOSIS — H6061 Unspecified chronic otitis externa, right ear: Secondary | ICD-10-CM

## 2021-06-07 DIAGNOSIS — E785 Hyperlipidemia, unspecified: Secondary | ICD-10-CM | POA: Diagnosis not present

## 2021-06-07 DIAGNOSIS — Z125 Encounter for screening for malignant neoplasm of prostate: Secondary | ICD-10-CM | POA: Diagnosis not present

## 2021-06-07 DIAGNOSIS — Z79899 Other long term (current) drug therapy: Secondary | ICD-10-CM

## 2021-06-07 MED ORDER — LISINOPRIL 20 MG PO TABS: 20.0000 mg | ORAL_TABLET | Freq: Every day | ORAL | 3 refills | Status: DC

## 2021-06-07 MED ORDER — ONETOUCH ULTRA VI STRP: ORAL_STRIP | 3 refills | Status: DC

## 2021-06-07 MED ORDER — OFLOXACIN 0.3 % OT SOLN: 10.0000 [drp] | Freq: Every day | OTIC | 0 refills | Status: DC

## 2021-06-07 MED ORDER — TECHLITE PEN NEEDLES 32G X 8 MM MISC: 3 refills | Status: DC

## 2021-06-07 MED ORDER — GABAPENTIN 300 MG PO CAPS: 300.0000 mg | ORAL_CAPSULE | Freq: Every day | ORAL | 3 refills | Status: DC

## 2021-06-07 NOTE — Patient Instructions (Addendum)
Good to see you today!   I will be in touch with your labs You got your tetanus and flu shots today   I will contact your GI doctor- Dr Ardis Hughs- about your colonoscopy being due We sent in an rx for the ear drops for you to use as needed for your right ear   Take care and please see me in about 6 months I would recommend that you get a covid booster soon!  I would also recommend the shingles vaccine - Shingrix- at your pharmacy

## 2021-06-08 LAB — LIPID PANEL
Cholesterol: 179 mg/dL (ref 0–200)
HDL: 36 mg/dL — ABNORMAL LOW (ref >39.00)
NonHDL: 143.05
Total CHOL/HDL Ratio: 5
Triglycerides: 273 mg/dL — ABNORMAL HIGH (ref 0.0–149.0)
VLDL: 54.6 mg/dL — ABNORMAL HIGH (ref 0.0–40.0)

## 2021-06-08 LAB — CBC
HCT: 43.5 % (ref 39.0–52.0)
Hemoglobin: 14.2 g/dL (ref 13.0–17.0)
MCHC: 32.5 g/dL (ref 30.0–36.0)
MCV: 83.9 fl (ref 78.0–100.0)
Platelets: 163 10*3/uL (ref 150.0–400.0)
RBC: 5.19 Mil/uL (ref 4.22–5.81)
RDW: 14.3 % (ref 11.5–15.5)
WBC: 8.1 10*3/uL (ref 4.0–10.5)

## 2021-06-08 LAB — HEMOGLOBIN A1C: Hgb A1c MFr Bld: 8.7 % — ABNORMAL HIGH (ref 4.6–6.5)

## 2021-06-08 LAB — COMPREHENSIVE METABOLIC PANEL
ALT: 21 U/L (ref 0–53)
AST: 19 U/L (ref 0–37)
Albumin: 3.8 g/dL (ref 3.5–5.2)
Alkaline Phosphatase: 88 U/L (ref 39–117)
BUN: 20 mg/dL (ref 6–23)
CO2: 28 mEq/L (ref 19–32)
Calcium: 9 mg/dL (ref 8.4–10.5)
Chloride: 103 mEq/L (ref 96–112)
Creatinine, Ser: 1.25 mg/dL (ref 0.40–1.50)
GFR: 57.99 mL/min — ABNORMAL LOW (ref >60.00)
Glucose, Bld: 282 mg/dL — ABNORMAL HIGH (ref 70–99)
Potassium: 4 mEq/L (ref 3.5–5.1)
Sodium: 139 mEq/L (ref 135–145)
Total Bilirubin: 0.4 mg/dL (ref 0.2–1.2)
Total Protein: 6.4 g/dL (ref 6.0–8.3)

## 2021-06-08 LAB — LDL CHOLESTEROL, DIRECT: Direct LDL: 124 mg/dL

## 2021-06-08 LAB — PSA: PSA: 0.71 ng/mL (ref 0.10–4.00)

## 2021-06-10 ENCOUNTER — Emergency Department (HOSPITAL_COMMUNITY)
Admission: EM | Admit: 2021-06-10 | Discharge: 2021-06-10 | Disposition: A | Discharge status: Home / Self Care | Payer: Medicare Other | Attending: Emergency Medicine | Admitting: Emergency Medicine

## 2021-06-10 ENCOUNTER — Other Ambulatory Visit: Payer: Self-pay

## 2021-06-10 ENCOUNTER — Encounter (HOSPITAL_COMMUNITY): Payer: Self-pay | Admitting: *Deleted

## 2021-06-10 ENCOUNTER — Emergency Department (HOSPITAL_COMMUNITY): Payer: Medicare Other

## 2021-06-10 DIAGNOSIS — R059 Cough, unspecified: Secondary | ICD-10-CM | POA: Insufficient documentation

## 2021-06-10 DIAGNOSIS — E876 Hypokalemia: Secondary | ICD-10-CM | POA: Diagnosis not present

## 2021-06-10 DIAGNOSIS — R0602 Shortness of breath: Secondary | ICD-10-CM | POA: Insufficient documentation

## 2021-06-10 DIAGNOSIS — Z79899 Other long term (current) drug therapy: Secondary | ICD-10-CM | POA: Insufficient documentation

## 2021-06-10 DIAGNOSIS — I2510 Atherosclerotic heart disease of native coronary artery without angina pectoris: Secondary | ICD-10-CM | POA: Insufficient documentation

## 2021-06-10 DIAGNOSIS — R5383 Other fatigue: Secondary | ICD-10-CM | POA: Insufficient documentation

## 2021-06-10 DIAGNOSIS — Z7982 Long term (current) use of aspirin: Secondary | ICD-10-CM | POA: Diagnosis not present

## 2021-06-10 DIAGNOSIS — R079 Chest pain, unspecified: Secondary | ICD-10-CM | POA: Diagnosis not present

## 2021-06-10 DIAGNOSIS — Z794 Long term (current) use of insulin: Secondary | ICD-10-CM | POA: Diagnosis not present

## 2021-06-10 DIAGNOSIS — E119 Type 2 diabetes mellitus without complications: Secondary | ICD-10-CM | POA: Insufficient documentation

## 2021-06-10 DIAGNOSIS — R6889 Other general symptoms and signs: Secondary | ICD-10-CM | POA: Diagnosis not present

## 2021-06-10 DIAGNOSIS — R0789 Other chest pain: Secondary | ICD-10-CM | POA: Diagnosis not present

## 2021-06-10 DIAGNOSIS — I5032 Chronic diastolic (congestive) heart failure: Secondary | ICD-10-CM | POA: Diagnosis not present

## 2021-06-10 DIAGNOSIS — I110 Hypertensive heart disease with heart failure: Secondary | ICD-10-CM | POA: Diagnosis not present

## 2021-06-10 DIAGNOSIS — Z743 Need for continuous supervision: Secondary | ICD-10-CM | POA: Diagnosis not present

## 2021-06-10 DIAGNOSIS — Z87891 Personal history of nicotine dependence: Secondary | ICD-10-CM | POA: Diagnosis not present

## 2021-06-10 LAB — BASIC METABOLIC PANEL
Anion gap: 6 (ref 5–15)
BUN: 10 mg/dL (ref 8–23)
CO2: 25 mmol/L (ref 22–32)
Calcium: 8.5 mg/dL — ABNORMAL LOW (ref 8.9–10.3)
Chloride: 110 mmol/L (ref 98–111)
Creatinine, Ser: 1.03 mg/dL (ref 0.61–1.24)
GFR, Estimated: >60 mL/min (ref >60)
Glucose, Bld: 85 mg/dL (ref 70–99)
Potassium: 2.9 mmol/L — ABNORMAL LOW (ref 3.5–5.1)
Sodium: 141 mmol/L (ref 135–145)

## 2021-06-10 LAB — CBC
HCT: 43.7 % (ref 39.0–52.0)
Hemoglobin: 13.6 g/dL (ref 13.0–17.0)
MCH: 27.1 pg (ref 26.0–34.0)
MCHC: 31.1 g/dL (ref 30.0–36.0)
MCV: 87.2 fL (ref 80.0–100.0)
Platelets: 163 10*3/uL (ref 150–400)
RBC: 5.01 MIL/uL (ref 4.22–5.81)
RDW: 13.7 % (ref 11.5–15.5)
WBC: 7.2 10*3/uL (ref 4.0–10.5)
nRBC: 0 % (ref 0.0–0.2)

## 2021-06-10 LAB — TROPONIN I (HIGH SENSITIVITY)
Troponin I (High Sensitivity): 16 ng/L (ref <18)
Troponin I (High Sensitivity): 15 ng/L (ref <18)

## 2021-06-10 MED ORDER — POTASSIUM CHLORIDE CRYS ER 20 MEQ PO TBCR: 20.0000 meq | EXTENDED_RELEASE_TABLET | Freq: Two times a day (BID) | ORAL | 0 refills | Status: DC

## 2021-06-10 MED ORDER — POTASSIUM CHLORIDE CRYS ER 20 MEQ PO TBCR
40.0000 meq | EXTENDED_RELEASE_TABLET | Freq: Once | ORAL | Status: AC
  Administered 2021-06-10: 40 meq via ORAL
  Filled 2021-06-10: qty 2

## 2021-06-10 MED ORDER — MAGNESIUM OXIDE -MG SUPPLEMENT 400 (240 MG) MG PO TABS
400.0000 mg | ORAL_TABLET | Freq: Once | ORAL | Status: AC
  Administered 2021-06-10: 400 mg via ORAL
  Filled 2021-06-10: qty 1

## 2021-06-10 MED ORDER — MAGNESIUM OXIDE -MG SUPPLEMENT 200 MG PO TABS: 200.0000 mg | ORAL_TABLET | Freq: Every day | ORAL | 0 refills | Status: AC

## 2021-06-10 NOTE — ED Provider Notes (Signed)
D/c home. Discussed with patient through interpreter, and he is feeling well.  Only significant abnormality is his potassium is low.  We will presumptively add on magnesium to the potassium prescribed by Dr. Joya Gaskins.  Tropes are negative x2.  Will discharge with return precautions.   Sherwood Gambler, MD 06/10/21 [phone]

## 2021-06-10 NOTE — Discharge Instructions (Addendum)
If you develop recurrent, continued, or worsening chest pain, shortness of breath, fever, vomiting, abdominal or back pain, or any other new/concerning symptoms then return to the ER for evaluation.  

## 2021-06-10 NOTE — ED Provider Notes (Signed)
[facility] EMERGENCY DEPARTMENT Provider Note   CSN: [id number] Arrival date & time: 06/10/21  0402     History Chief Complaint  Patient presents with   Chest Pain    Barry [name] is a 70 y.o. male.  Barry [name] presented with shortness of breath, fatigue, and some intermittent chest pain x1 week.  History was slightly limited secondary to his being hard of hearing.  However, a [place] interpreter was used, and the patient did understand my questions.  The history is provided by the patient. A language interpreter was used.  Shortness of Breath Severity:  Moderate Onset quality:  Gradual Duration:  1 week Timing:  Constant Progression:  Unchanged Chronicity:  New Context comment:  Unclear etiology Relieved by:  Nothing Worsened by:  Activity Ineffective treatments:  Rest Associated symptoms: chest pain and cough   Associated symptoms: no abdominal pain, no ear pain, no fever, no rash, no sore throat and no vomiting       Past Medical History:  Diagnosis Date   CAD (coronary artery disease)    a. LHC (02/26/14):  mLAD 20 (faint L>R collats), mCFX 50, pRCA 95 (plaque rupture) >> PCI:  Xience DES to pRCA (severe tortuosity of R innominate artery >> needs L radial or FA in future);  b. LHC (03/10/14):  CFX 90, oOM 70, pRCA stent patent >> PCI:  Xience DES to mCFX    Carotid stenosis    a. Carotid US (02/27/14):  Bilateral ICA 1-39%   Depression    Diabetes mellitus    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[place])    a. Mesenteric Artery Duplex (7/15):  pSMA chronic dissection with aneurysmal dilation of 1.29 cm (VVS);  b.  Chest CTA (02/26/14):  IMPRESSION:  1. No aortic  dissection or other acute abnormality.  2. 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  3. Stable 17 mm ectasia of the left common iliac artery.  4. Atherosclerosis, including aortoiliac and coronary artery disease.    History of echocardiogram 09/2015   Echo 1/17: mod LVH, EF 55%, inf-lat HK, Gr 1  DD, mild LAE   Hx of cardiovascular stress test    Lexiscan Myoview (2/16):  Inferior lateral defect c/w thinning vs small prior infarct, no ischemia, EF 49%, Low Risk   Hx of echocardiogram    a.  Echocardiogram (02/27/14):  Mod focal basal and mild concentric LVH, EF 50-55%, no RWMA, Gr 1 DD, mild TR, normal RVF   Hyperlipidemia    Hypertension    Iliac artery aneurysm, left (HCC)    Myocardial infarction ([place])    Stroke ([place]) 09/26/2009    Patient Active Problem List   Diagnosis Date Noted   Atherosclerotic peripheral vascular disease with ulceration ([place]) 04/03/2015   Coronary artery disease involving native coronary artery of native heart without angina pectoris 03/28/2014   Aneurysm of iliac artery ([place]) 03/18/2014   Abdominal pain, unspecified site 03/18/2014   Syncope in setting of nausea and vomiting 03/07/14 03/08/2014   Non compliance with medical treatment 02/28/2014   Dyslipidemia, goal LDL below 70 02/28/2014   Bradycardia- beta blocker decreased 02/28/2014   Chronic diastolic CHF (congestive heart failure) ([place]) 02/28/2014   Type 2 diabetes mellitus with manifestations ([place]) 02/27/2014   TIA (transient ischemic attack) 02/26/2014   History of non-ST elevation myocardial infarction (NSTEMI) 02/26/2014   Weakness-Left arm / Lef 12/06/2013   Mesenteric artery stenosis ([place]) 12/06/2013   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[place]) 12/03/2012   Hypertension  11/10/2011   History of CVA in adulthood 11/10/2011   Language barrier 11/10/2011    Past Surgical History:  Procedure Laterality Date   CARDIAC CATHETERIZATION     CARDIAC CATHETERIZATION N/A 06/25/2015   Procedure: Left Heart Cath and Coronary Angiography;  Surgeon: Burnell Blanks, MD;  Location: [facility] CV LAB;  Service: Cardiovascular;  Laterality: N/A;   COLONOSCOPY WITH PROPOFOL N/A 07/23/2015   Procedure: COLONOSCOPY WITH PROPOFOL;  Surgeon: [name], MD;  [facility] ENDOS[facility];  Service:  Endoscopy;  Laterality: N/A;   CORONARY ANGIOPLASTY     LEFT HEART CATHETERIZATION WITH CORONARY ANGIOGRAM N/A 02/26/2014   Procedure: LEFT HEART CATHETERIZATION WITH CORONARY ANGIOGRAM;  Surgeon: Wellington Hampshire, MD;  Location: [facility] CATH LAB;  Service: Cardiovascular;  Laterality: N/A;   LEFT HEART CATHETERIZATION WITH CORONARY ANGIOGRAM N/A 03/10/2014   Procedure: LEFT HEART CATHETERIZATION WITH CORONARY ANGIOGRAM;  Surgeon: Jettie Booze, MD;  Location[facility] CATH LAB;  Service: Cardiovascular;  Laterality: N/A;       Family History  Problem Relation Age of Onset   Diabetes Father    Hypertension Father    Heart attack Father    Stroke Neg Hx     Social History   Tobacco Use   Smoking status: Former    Packs/day: 0.25    Years: 38.00    Pack years: 9.50    Types: Cigarettes    Quit date: 2008    Years since quitting: 14.7   Smokeless tobacco: Never  Vaping Use   Vaping Use: Never used  Substance Use Topics   Alcohol use: No    Alcohol/week: 0.0 standard drinks   Drug use: No    Home Medications Prior to Admission medications   Medication Sig Start Date End Date Taking? Authorizing Provider  Alirocumab (PRALUENT) 75 MG/ML SOAJ Inject 75 mg into the skin every 14 (fourteen) days. Patient not taking: Reported on 06/07/2021    [provider]  amLODipine (NORVASC) 5 MG tablet Take 1 tablet by mouth once daily 12/30/20   Richardson Dopp T, PA-C  aspirin EC 81 MG EC tablet Take 1 tablet (81 mg total) by mouth daily. 05/05/17   Geradine Girt, DO  blood glucose meter kit and supplies Dispense based on patient and insurance preference. Use up to four times daily as directed. (FOR ICD-10 E10.9, E11.9). 05/10/19   Copland, Gay Filler, MD  gabapentin (NEURONTIN) 300 MG capsule Take 1 capsule (300 mg total) by mouth at bedtime. 06/07/21   Copland, Gay Filler, MD  glucose blood (ONETOUCH ULTRA) test strip USE 1 STRIP TO CHECK GLUCOSE TWICE DAILY . 06/07/21   Copland, Gay Filler, MD   insulin glargine (LANTUS) 100 UNIT/ML injection Inject 0.22 mLs (22 Units total) into the skin every morning. And pen needles 1/day Patient taking differently: Inject 27 Units into the skin every morning. And pen needles 1/day 10/23/20   Renato Shin, MD  Insulin Pen Needle (TECHLITE PEN NEEDLES) 32G X 8 MM MISC Use daily to administer insulin 06/07/21   Copland, Gay Filler, MD  Lancets (ONETOUCH DELICA PLUS [id number]) [place] SMARTSIG:1 Topical Daily 06/12/20   [provider]  lisinopril (ZESTRIL) 20 MG tablet Take 1 tablet (20 mg total) by mouth daily. Patient not taking: Reported on 06/07/2021 06/07/21   Copland, Gay Filler, MD  methocarbamol (ROBAXIN) 500 MG tablet Take 1 tablet (500 mg total) by mouth every 8 (eight) hours as needed for muscle spasms. Use as needed for shoulder  pain Patient not taking: Reported on 06/07/2021 09/14/20   Copland, Gay Filler, MD  ofloxacin (FLOXIN OTIC) 0.3 % OTIC solution Place 10 drops into the right ear daily. Use for 5-7 days as needed 06/07/21   Copland, Gay Filler, MD  rosuvastatin (CRESTOR) 40 MG tablet Take 1 tablet by mouth once daily 12/14/20   Richardson Dopp T, PA-C    Allergies    Metformin and related  Review of Systems   Review of Systems  Constitutional:  Negative for chills and fever.  HENT:  Negative for ear pain and sore throat.   Eyes:  Negative for pain and visual disturbance.  Respiratory:  Positive for cough and shortness of breath.   Cardiovascular:  Positive for chest pain. Negative for palpitations.  Gastrointestinal:  Negative for abdominal pain and vomiting.  Genitourinary:  Negative for dysuria and hematuria.  Musculoskeletal:  Negative for arthralgias and back pain.  Skin:  Negative for color change and rash.  Neurological:  Negative for seizures and syncope.  All other systems reviewed and are negative.  Physical Exam Updated Vital Signs BP 140/79   Pulse (!) 52   Temp 97.7 F (36.5 C) (Temporal)   Resp 12   Ht $R'5\' 5"'dL$   (1.651 m)   Wt 72.6 kg   SpO2 100%   BMI 26.63 kg/m   Physical Exam Vitals and nursing note reviewed.  Constitutional:      Appearance: Normal appearance.  HENT:     Head: Normocephalic and atraumatic.  Cardiovascular:     Rate and Rhythm: Normal rate and regular rhythm.     Heart sounds: Normal heart sounds.  Pulmonary:     Effort: Pulmonary effort is normal.     Breath sounds: Normal breath sounds.  Abdominal:     General: There is no distension.     Tenderness: There is no abdominal tenderness. There is no guarding.  Musculoskeletal:     Cervical back: Normal range of motion.     Right lower leg: No edema.     Left lower leg: No edema.  Skin:    General: Skin is warm and dry.  Neurological:     General: No focal deficit present.     Mental Status: He is alert and oriented to person, place, and time.  Psychiatric:        Mood and Affect: Mood normal.        Behavior: Behavior normal.    ED Results / Procedures / Treatments   Labs (all labs ordered are listed, but only abnormal results are displayed) Labs Reviewed  BASIC METABOLIC PANEL - Abnormal; Notable for the following components:      Result Value   Potassium 2.9 (*)    Calcium 8.5 (*)    All other components within normal limits  CBC  TROPONIN I (HIGH SENSITIVITY)  TROPONIN I (HIGH SENSITIVITY)    EKG EKG Interpretation  Date/Time:  Thursday June 10 2021 04:05:34 EDT Ventricular Rate:  70 PR Interval:  185 QRS Duration: 111 QT Interval:  377 QTC Calculation: 407 R Axis:   32 Text Interpretation: Sinus rhythm Multiple ventricular premature complexes Anteroseptal infarct, old Nonspecific repol abnormality, lateral leads similar to prior Confirmed by Lorre Munroe ([id number]) on 06/10/2021 4:18:33 AM  Radiology DG Chest Port 1 View  Result Date: 06/10/2021 CLINICAL DATA:  Chest pain EXAM: PORTABLE CHEST 1 VIEW COMPARISON:  05/04/2020 FINDINGS: Extensive artifact from EKG leads. There is no edema,  consolidation, effusion, or pneumothorax. Stable heart  size and mediastinal contours. IMPRESSION: Stable exam.  No evidence of active disease. Electronically Signed   By: Jorje Guild M.D.   On: 06/10/2021 04:39    Procedures Procedures   Medications Ordered in ED Medications - No data to display  ED Course  I have reviewed the triage vital signs and the nursing notes.  Pertinent labs & imaging results that were available during my care of the patient were reviewed by me and considered in my medical decision making (see chart for details).    MDM Rules/Calculators/A&P                           Angelo Bunnell presents with fatigue, and dyspnea, and chest pain off and on for about a week.  He is well-appearing.  ED work-up undertaken to evaluate for acute coronary syndrome, pneumonia, or other obvious source of his symptoms.  At this point, he is pending repeat troponin.  He was noted to have a slightly low potassium, and he was given a prescription for supplementation. Final Clinical Impression(s) / ED Diagnoses Final diagnoses:  Chest pain, unspecified type  Hypokalemia    Rx / DC Orders ED Discharge Orders          Ordered    potassium chloride SA (KLOR-CON) 20 MEQ tablet  2 times daily        06/10/21 0707             Arnaldo Natal, MD 06/10/21 2320

## 2021-06-10 NOTE — ED Notes (Signed)
Pt is pain free at  the moment

## 2021-06-10 NOTE — ED Triage Notes (Signed)
The pt arrived by gems from home central chest pain since 12 mn  stents in coronary arteries. Pt took one aspiin '81mg'$  and one sl nitro with not much  help with the pain  ems gave 3 more baby aspirin and 2 sl nitro on the way here   pain improved prior to arrival. Iv per ems  pt speaks cambodian

## 2021-06-10 NOTE — Patient Instructions (Signed)
Visit Information  PATIENT GOALS:  Goals Addressed             This Visit's Progress    Chronic Care Management - Pharmacy Care Plan       Current Barriers:  Unable to independently monitor therapeutic efficacy Unable to achieve control of type 2 diabetes  Does not adhere to prescribed medication regimen Does not maintain contact with provider office Does not contact provider office for questions/concerns Language barrier  Pharmacist Clinical Goal(s):  Over the next 90 days, patient will achieve adherence to monitoring guidelines and medication adherence to achieve therapeutic efficacy achieve control of type 2 diabetes as evidenced by A1c < 7.5% maintain control of blood pressure and hyperlipidemia  as evidenced by blood pressure <140/90 and LDL <70  adhere to prescribed medication regimen as evidenced by refill history contact provider office for questions/concerns as evidenced notation of same in electronic health record through collaboration with PharmD and provider.   Interventions: 1:1 collaboration with Copland, Gay Filler, MD regarding development and update of comprehensive plan of care as evidenced by provider attestation and co-signature Inter-disciplinary care team collaboration (see longitudinal plan of care) Comprehensive medication review performed; medication list updated in electronic medical record  Diabetes: Lab Results  Component Value Date   HGBA1C 10.2 (A) 10/23/2020  Current treatment: Lantus 27 units daily Interventions:  Check blood glucose 1 to 2 times a day and record Reviewed home blood glucose goals  Fasting blood glucose goal (before meals) = 80 to 130 Blood glucose goal after a meal = less than 180  Review signs and symptoms of low blood glucose (less than 70) and how to treat If patient is having symptoms should check blood glucose, if less than 70, you should eat 15 grams of carbohydrates (4 oz of juice, soda, 4 glucose tablets, or 3-4 pieces  of hard candy). Wait 15 minutes and then recheck your blood sugar. If your sugar is still less than 70, eat another 15 grams of carbohydrates. Wait another 15 minutes and recheck your glucose. Continue this until your sugar is over 70. Then, eat a snack with protein in it to prevent your sugar from dropping again. Contact provider if blood glucose is less than 70 more than once per week.  A1c was checked today - results not back yet Coordinated refills with pharmacy and provider for testing supplies, insulin syringes and Lantus  Hypertension: Blood pressure was not at goal today (goal <140/90) BP Readings from Last 3 Encounters:  06/07/21 (!) 142/82  10/23/20 110/60  10/08/20 (!) 100/50  Current treatment:  Lisinopril '20mg'$  daily Amlodipine '5mg'$  daily Interventions:  Restart both blood pressure medications Discussed with patient and his wife, benefits of both blood pressure medications and importance in preventing heart disease, stroke and kidney disease. Reviewed blood pressure goal  Hyperlipidemia / CAD / PVD / history of CVA Controlled - last LDL was 36 (05/11/2020) Current treatment: Rosuvastatin '40mg'$  daily (last filled 12/14/2020) Praluent '75mg'$  - inject into skin every 14 days. (Not taking) Interventions:  Patient is due to have lipids rechecked - pending Removed Praluent from medication list Coordinated refills for rosuvastatin at patient's pharmacy   Medication Management:  Current pharmacy: [facility] Interventions:  Reviewed refill history Discuss importance of taking all his medication every day with patient and his wife.  Will continue to follow up with family to make sure patient is taking medications as prescribed.   Patient Goals/Self-Care Activities Over the next 90 days, patient will: take medications  as prescribed,  focus on medication adherence by using reminder boxes if needed,  check glucose 1 to 2 times a day, document, and provide at future appointments, and   keep appointments with medical providers.   Follow Up Plan: phone follow up with son or wife in 2 to 3 weeks.         The patient verbalized understanding of instructions, educational materials, and care plan provided today and declined offer to receive copy of patient instructions, educational materials, and care plan.   Telephone follow up appointment with care management team member scheduled for: 2 to 3 weeks  Cherre Robins, PharmD Clinical Pharmacist [facility] Primary Care [facility]

## 2021-06-10 NOTE — Chronic Care Management (AMB) (Signed)
Chronic Care Management Pharmacy Note  06/10/2021 Name:  Barry [name] MRN:  [id number] DOB:  11-20-1950  Summary: Patient brought in current medication he was taking. Updated medication list.  He has restart rosuvastatin but unsure how long he has been taking consistently. LDL was elevated today.  A1c improved but no at goal   Recommendations/Changes made from today's visit: Discussed importance of each medication and reviewed how to take.  Messaged PCP regarding just continuing rosuvastatin and see if improved medication adherence gets LDL to goal or add additional agent like ezetimbe or PCSK9.  Consider increasing Lantus to goal FBG of 120 or less - can slowly titrate 2 units every 2 to 7 days.    Subjective: Barry [name] is an 70 y.o. year old male who is a primary patient of Copland, Gay Filler, MD.  The CCM team was consulted for assistance with disease management and care coordination needs.    Engaged with patient and his wifeface to face for follow up visit in response to provider referral for pharmacy case management and/or care coordination services.   Consent to Services:  The patient was given information about Chronic Care Management services, agreed to services, and gave verbal consent prior to initiation of services.  Please see initial visit note for detailed documentation.   Patient Care Team: Copland, Gay Filler, MD as PCP - General (Family Medicine) Sherren Mocha, MD as PCP - Cardiology (Cardiology) Elayne Snare, MD as Consulting Physician (Endocrinology) Sharmon Revere as Physician Assistant (Cardiology) Cherre Robins, PharmD (Pharmacist)  Recent office visits: 06/07/2021 - saw Dr Lorelei Pont today 09/14/20-Jessica C. Copland, MD (PCP) Seen for annual exam. Start on : methocarbamol (ROBAXIN) 500 MG tablet for left shoulder. Follow up 6 months.   Recent consult visits: 10/23/20-(Endocrinology) Renato Shin, MD. Diabetic follow up. Increase the Lantus to 22 units  each morning. Please stop taking the Victoza. Follow up in 1 month. 10/09/19-(Cardiology) Bing Matter, PA. Seen for 6 month CAD follow up. Advised to hold Norvasc today. He can restart tomorrow if blood pressure  Improves. EKG completed. Follow up in 6 months. 09/07/20-(Endocrinology) Renato Shin, MD. Diabetic follow up. Please stop taking the glipizide. Follow up in 6 weeks.   Hospital visits:  None in previous 6 months   Objective:  Lab Results  Component Value Date   CREATININE 1.03 06/10/2021   CREATININE 1.25 06/07/2021   CREATININE 1.34 (H) 05/04/2020    Lab Results  Component Value Date   HGBA1C 8.7 (H) 06/07/2021   Last diabetic Eye exam: No results found for: HMDIABEYEEXA  Last diabetic Foot exam: No results found for: HMDIABFOOTEX      Component Value Date/Time   CHOL 179 06/07/2021 1355   CHOL 92 (L) 05/11/2020 1020   TRIG 273.0 (H) 06/07/2021 1355   HDL 36.00 (L) 06/07/2021 1355   HDL 34 (L) 05/11/2020 1020   CHOLHDL 5 06/07/2021 1355   VLDL 54.6 (H) 06/07/2021 1355   LDLCALC 35 05/11/2020 1020   LDLDIRECT 124.0 06/07/2021 1355    Hepatic Function Latest Ref Rng & Units 06/07/2021 05/11/2020 12/19/2019  Total Protein 6.0 - 8.3 g/dL 6.4 6.5 6.7  Albumin 3.5 - 5.2 g/dL 3.8 4.1 3.7  AST 0 - 37 U/L 19 22 20   ALT 0 - 53 U/L 21 35 20  Alk Phosphatase 39 - 117 U/L 88 66 92  Total Bilirubin 0.2 - 1.2 mg/dL 0.4 0.4 1.2  Bilirubin, Direct 0.00 - 0.40 mg/dL - 0.13 -  Lab Results  Component Value Date/Time   TSH 1.34 06/07/2017 09:51 AM    CBC Latest Ref Rng & Units 06/10/2021 06/07/2021 05/04/2020  WBC 4.0 - 10.5 K/uL 7.2 8.1 7.5  Hemoglobin 13.0 - 17.0 g/dL 13.6 14.2 13.9  Hematocrit 39.0 - 52.0 % 43.7 43.5 43.7  Platelets 150 - 400 K/uL 163 163.0 133(L)    Lab Results  Component Value Date/Time   VD25OH 29 (L) 07/01/2015 09:00 AM    Clinical ASCVD: Yes  The 10-year ASCVD risk score (Arnett DK, et al., 2019) is: 46.5%   Values used to calculate  the score:     Age: 70 years     Sex: Male     Is Non-Hispanic African American: No     Diabetic: Yes     Tobacco smoker: No     Systolic Blood Pressure: 465 mmHg     Is BP treated: Yes     HDL Cholesterol: 36 mg/dL     Total Cholesterol: 179 mg/dL     Social History   Tobacco Use  Smoking Status Former   Packs/day: 0.25   Years: 38.00   Pack years: 9.50   Types: Cigarettes   Quit date: 2008   Years since quitting: 14.7  Smokeless Tobacco Never   BP Readings from Last 3 Encounters:  06/10/21 140/79  06/07/21 (!) 142/82  10/23/20 110/60   Pulse Readings from Last 3 Encounters:  06/10/21 (!) 52  06/07/21 98  10/23/20 82   Wt Readings from Last 3 Encounters:  06/10/21 160 lb 0.9 oz (72.6 kg)  06/07/21 160 lb (72.6 kg)  10/23/20 164 lb 3.2 oz (74.5 kg)    Assessment: Review of patient past medical history, allergies, medications, health status, including review of consultants reports, laboratory and other test data, was performed as part of comprehensive evaluation and provision of chronic care management services.   SDOH:  (Social Determinants of Health) assessments and interventions performed:  SDOH Interventions    Flowsheet Row Most Recent Value  SDOH Interventions   Transportation Interventions Intervention Not Indicated       CCM Care Plan  Allergies  Allergen Reactions   Metformin And Related     Pt feels that this causes constipation and will not take     Medications Reviewed Today     Reviewed by Cherre Robins, PharmD (Pharmacist) on 06/07/21 at 1412  Med List Status: <None>   Medication Order Taking? Sig Documenting Provider Last Dose Status Informant  Alirocumab (PRALUENT) 75 MG/ML SOAJ [id number] No Inject 75 mg into the skin every 14 (fourteen) days.  Patient not taking: Reported on 06/07/2021   [provider] Not Taking Active Child  amLODipine (NORVASC) 5 MG tablet [id number] Yes Take 1 tablet by mouth once daily Richardson Dopp T,  PA-C Taking Active Child  aspirin EC 81 MG EC tablet [id number] No Take 1 tablet (81 mg total) by mouth daily. Geradine Girt, DO Unknown Active Child  blood glucose meter kit and supplies [id number] Yes Dispense based on patient and insurance preference. Use up to four times daily as directed. (FOR ICD-10 E10.9, E11.9). Copland, Gay Filler, MD Taking Active Child           Med Note Barbaraann Boys Jun 07, 2021  2:11 PM) Patient is using One Touch Ultra glucometer  gabapentin (NEURONTIN) 300 MG capsule [id number] Yes Take 1 capsule (300 mg total) by mouth at bedtime. Copland, Gay Filler, MD Taking Active  glucose blood (ONETOUCH ULTRA) test strip [id number] Yes USE 1 STRIP TO CHECK GLUCOSE TWICE DAILY . Copland, Gay Filler, MD Taking Active   insulin glargine (LANTUS) 100 UNIT/ML injection [id number] Yes Inject 0.22 mLs (22 Units total) into the skin every morning. And pen needles 1/day Renato Shin, MD Taking Active Child  Insulin Pen Needle (TECHLITE PEN NEEDLES) 32G X 8 MM MISC [id number] Yes Use daily to administer insulin Copland, Gay Filler, MD Taking Active   Lancets (ONETOUCH DELICA PLUS DJTTSV77L) [place] [id number] Yes SMARTSIG:1 Topical Daily [provider] Taking Active Child  lisinopril (ZESTRIL) 20 MG tablet [id number] No Take 1 tablet (20 mg total) by mouth daily.  Patient not taking: Reported on 06/07/2021   Copland, Gay Filler, MD Not Taking Active   methocarbamol (ROBAXIN) 500 MG tablet [id number] No Take 1 tablet (500 mg total) by mouth every 8 (eight) hours as needed for muscle spasms. Use as needed for shoulder pain  Patient not taking: Reported on 06/07/2021   Copland, Gay Filler, MD Not Taking Consider Medication Status and Discontinue Child  ofloxacin (FLOXIN OTIC) 0.3 % OTIC solution [id number]  Place 10 drops into the right ear daily. Use for 5-7 days as needed Copland, Gay Filler, MD  Active   rosuvastatin (CRESTOR) 40 MG tablet [id number] Yes Take 1 tablet by mouth once  daily Richardson Dopp T, [place] Taking Active Child            Patient Active Problem List   Diagnosis Date Noted   Atherosclerotic peripheral vascular disease with ulceration ([place]) 04/03/2015   Coronary artery disease involving native coronary artery of native heart without angina pectoris 03/28/2014   Aneurysm of iliac artery ([place]) 03/18/2014   Abdominal pain, unspecified site 03/18/2014   Syncope in setting of nausea and vomiting 03/07/14 03/08/2014   Non compliance with medical treatment 02/28/2014   Dyslipidemia, goal LDL below 70 02/28/2014   Bradycardia- beta blocker decreased 02/28/2014   Chronic diastolic CHF (congestive heart failure) ([place]) 02/28/2014   Type 2 diabetes mellitus with manifestations ([place]) 02/27/2014   TIA (transient ischemic attack) 02/26/2014   History of non-ST elevation myocardial infarction (NSTEMI) 02/26/2014   Weakness-Left arm / Lef 12/06/2013   Mesenteric artery stenosis (H[facility]) 12/06/2013   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[place]) 12/03/2012   Hypertension 11/10/2011   History of CVA in adulthood 11/10/2011   Language barrier 11/10/2011    Immunization History  Administered Date(s) Administered   Fluad Quad(high Dose 65+) 07/23/2020   Influenza, High Dose Seasonal PF 11/15/2017, 06/20/2018, 06/07/2021   PFIZER(Purple Top)SARS-COV-2 Vaccination 02/18/2020, 04/06/2020   Pneumococcal Conjugate-13 11/15/2017   Pneumococcal Polysaccharide-23 02/28/2014, 07/23/2020   Tdap 06/07/2021    Conditions to be addressed/monitored: CAD, HTN, HLD, DMII, and PVD, history of CVA  Care Plan : General Pharmacy (Adult)  Updates made by Cherre Robins, PHARMD since 06/10/2021 12:00 AM     Problem: Chronic Care and medication management for: type 2 diabetes, hyperlipidemia; hypertension; CAD with history of CVA and PVD      Long-Range Goal: Provide education and support for medication management and disease state management.   Start Date: 05/28/2021  Priority: High   Note:   Current Barriers:  Unable to independently monitor therapeutic efficacy Unable to achieve control of type 2 diabetes  Does not adhere to prescribed medication regimen Does not maintain contact with provider office Does not contact provider office for questions/concerns Language barrier - patient's primary language is [place]   Pharmacist Clinical Goal(s):  Over the  next 90 days, patient will achieve adherence to monitoring guidelines and medication adherence to achieve therapeutic efficacy achieve control of type 2 diabetes as evidenced by A1c < 7.5% maintain control of blood pressure and hyperlipidemia  as evidenced by blood pressure <140/90 and LDL <70  adhere to prescribed medication regimen as evidenced by refill history contact provider office for questions/concerns as evidenced notation of same in electronic health record through collaboration with PharmD and provider.   Interventions: 1:1 collaboration with Copland, Gay Filler, MD regarding development and update of comprehensive plan of care as evidenced by provider attestation and co-signature Inter-disciplinary care team collaboration (see longitudinal plan of care) Comprehensive medication review performed; medication list updated in electronic medical record  Diabetes: Uncontrolled per last A1c form 10/23/2020 which was 10.2% A1c was checked today - pending Current treatment: Lantus 27 units daily (chart has 22 units daily) Current glucose readings: checking two to four times a day. Ranges from 120 - 200.  Current meal patterns: unable to assess Previous medications tried: glipizide, Victoza Interventions:  Reviewed home blood glucose goals  Fasting blood glucose goal (before meals) = 80 to 130 Blood glucose goal after a meal = less than 180  Review signs and symptoms of low blood glucose (less than 70) and how to treat If patient is having symptoms should check BG, if less than 70, you should eat 15 grams of  carbohydrates (4 oz of juice, soda, 4 glucose tablets, or 3-4 pieces of hard candy). Wait 15 minutes and then recheck your blood sugar. If your sugar is still less than 70, eat another 15 grams of carbohydrates. Wait another 15 minutes and recheck your glucose. Continue this until your sugar is over 70. Then, eat a snack with protein in it to prevent your sugar from dropping again. Contact provider if blood glucose is less than 70 more than once per week.  Depending on A1c will adjust Lantus  Plan to follow adherence more closely with frequent check ins with patient / family Provider updated Rxs for insulin syringes and testing supplies today.  Hypertension: Blood pressure was elevated today; patient admitted to not taking blood pressure meds are prescribed BP Readings from Last 3 Encounters:  06/10/21 140/79  06/07/21 (!) 142/82  10/23/20 110/60  Current treatment:  Lisinopril 20m daily Amlodipine 551mdaily  Current home readings: none reported Interventions:  Reminded that refills for lisinopril and amlodipine are due Coordinated with patient's pharmacy for refills.  Reviewed blood pressure goal  Hyperlipidemia / CAD / PVD / history of CVA Controlled - last LDL was 36 (05/11/2020) Current treatment: Rosuvastatin 4046maily (last filled 12/14/2020) Praluent 84m23minject into skin every 14 days. (Patient endorsed he is not taking)  Interventions:  Lipids rechecked today.  Reviewed medication refill history and neither of the above meds have been filled on time.  Discussed adherence with patient and wife. Patient is willing to take rosuvastatin. Will await labs results.   Medication Management:  Current pharmacy: [facility] Interventions:  Reviewed refill history Identified inconsistencies in refills Reviewed each medication with patient and wife while in office. Discussed what each medication is for and reason for taking.  Coordinated with pharmacist for needed refills.    Patient Goals/Self-Care Activities Over the next 90 days, patient will:  take medications as prescribed, focus on medication adherence by using reminder boxes if needed, check glucose 1 to 2 times a day, document, and provide at future appointments, and keep appointments with medical providers.  Follow up: 2  to 3 weeks by phone with either wife or son.        Medication Assistance: None required.  Patient affirms current coverage meets needs.  Patient's preferred pharmacy is:  [facility] [address] ([address] [place] [facility] [place] [address]  ([place]) [place] [address] Phone: [phone] Fax: [phone]   Follow Up:  Patient agrees to Care Plan and Follow-up.  Plan: Telephone follow up appointment with care management team member scheduled for:  2 to 3 weeks  Cherre Robins, PharmD Clinical Pharmacist [facility] Primary Care [facility] [place] [facility]

## 2021-06-14 ENCOUNTER — Ambulatory Visit: Payer: Medicare Other | Admitting: Pharmacist

## 2021-06-14 DIAGNOSIS — E785 Hyperlipidemia, unspecified: Secondary | ICD-10-CM

## 2021-06-14 DIAGNOSIS — I10 Essential (primary) hypertension: Secondary | ICD-10-CM

## 2021-06-14 DIAGNOSIS — E118 Type 2 diabetes mellitus with unspecified complications: Secondary | ICD-10-CM

## 2021-06-14 DIAGNOSIS — Z79899 Other long term (current) drug therapy: Secondary | ICD-10-CM

## 2021-06-18 NOTE — Chronic Care Management (AMB) (Signed)
Chronic Care Management Pharmacy Note  06/18/2021 Name:  Barry [name] MRN:  [id number] DOB:  05-07-1951   Subjective: Barry [name] is an 70 y.o. year old male who is a primary patient of Copland, Gay Filler, MD.  The CCM team was consulted for assistance with disease management and care coordination needs.    Engaged with son by phone for follow up visit in response to provider referral for pharmacy case management and/or care coordination services.   Consent to Services:  The patient was given information about Chronic Care Management services, agreed to services, and gave verbal consent prior to initiation of services.  Please see initial visit note for detailed documentation.   Patient Care Team: Copland, Gay Filler, MD as PCP - General (Family Medicine) Sherren Mocha, MD as PCP - Cardiology (Cardiology) Elayne Snare, MD as Consulting Physician (Endocrinology) Sharmon Revere as Physician Assistant (Cardiology) Cherre Robins, PharmD (Pharmacist)  Recent office visits: 06/07/2021 - saw Dr Lorelei Pont. Increase lisinopril to 70m daily. Updated several prescriptions. 09/14/20-Jessica C. Copland, MD (PCP) Seen for annual exam. Start on : methocarbamol (ROBAXIN) 500 MG tablet for left shoulder. Follow up 6 months.   Recent consult visits: 10/23/20-(Endocrinology) SRenato Shin MD. Diabetic follow up. Increase the Lantus to 22 units each morning. Please stop taking the Victoza. Follow up in 1 month. 10/09/19-(Cardiology) BBing Matter PA. Seen for 6 month CAD follow up. Advised to hold Norvasc today. He can restart tomorrow if blood pressure  Improves. EKG completed. Follow up in 6 months. 09/07/20-(Endocrinology) SRenato Shin MD. Diabetic follow up. Please stop taking the glipizide. Follow up in 6 weeks.   Hospital visits:  None in previous 6 months   Objective:  Lab Results  Component Value Date   CREATININE 1.03 06/10/2021   CREATININE 1.25 06/07/2021   CREATININE 1.34 (H)  05/04/2020    Lab Results  Component Value Date   HGBA1C 8.7 (H) 06/07/2021   Last diabetic Eye exam: No results found for: HMDIABEYEEXA  Last diabetic Foot exam: No results found for: HMDIABFOOTEX      Component Value Date/Time   CHOL 179 06/07/2021 1355   CHOL 92 (L) 05/11/2020 1020   TRIG 273.0 (H) 06/07/2021 1355   HDL 36.00 (L) 06/07/2021 1355   HDL 34 (L) 05/11/2020 1020   CHOLHDL 5 06/07/2021 1355   VLDL 54.6 (H) 06/07/2021 1355   LDLCALC 35 05/11/2020 1020   LDLDIRECT 124.0 06/07/2021 1355    Hepatic Function Latest Ref Rng & Units 06/07/2021 05/11/2020 12/19/2019  Total Protein 6.0 - 8.3 g/dL 6.4 6.5 6.7  Albumin 3.5 - 5.2 g/dL 3.8 4.1 3.7  AST 0 - 37 U/L _0 ALT 0 - 53 U/L 21 35 20  Alk Phosphatase 39 - 117 U/L 88 66 92  Total Bilirubin 0.2 - 1.2 mg/dL 0.4 0.4 1.2  Bilirubin, Direct 0.00 - 0.40 mg/dL - 0.13 -    Lab Results  Component Value Date/Time   TSH 1.34 06/07/2017 09:51 AM    CBC Latest Ref Rng & Units 06/10/2021 06/07/2021 05/04/2020  WBC 4.0 - 10.5 K/uL 7.2 8.1 7.5  Hemoglobin 13.0 - 17.0 g/dL 13.6 14.2 13.9  Hematocrit 39.0 - 52.0 % 43.7 43.5 43.7  Platelets 150 - 400 K/uL 163 163.0 133(L)    Lab Results  Component Value Date/Time   VD25OH 29 (L) 07/01/2015 09:00 AM    Clinical ASCVD: Yes  The 10-year ASCVD risk score (Arnett DK, et al., 2019) is: 46.5%  Values used to calculate the score:     Age: 27 years     Sex: Male     Is Non-Hispanic African American: No     Diabetic: Yes     Tobacco smoker: No     Systolic Blood Pressure: 782 mmHg     Is BP treated: Yes     HDL Cholesterol: 36 mg/dL     Total Cholesterol: 179 mg/dL     Social History   Tobacco Use  Smoking Status Former   Packs/day: 0.25   Years: 38.00   Pack years: 9.50   Types: Cigarettes   Quit date: 2008   Years since quitting: 14.7  Smokeless Tobacco Never   BP Readings from Last 3 Encounters:  06/10/21 140/79  06/07/21 (!) 142/82  10/23/20 110/60    Pulse Readings from Last 3 Encounters:  06/10/21 (!) 52  06/07/21 98  10/23/20 82   Wt Readings from Last 3 Encounters:  06/10/21 160 lb 0.9 oz (72.6 kg)  06/07/21 160 lb (72.6 kg)  10/23/20 164 lb 3.2 oz (74.5 kg)    Assessment: Review of patient past medical history, allergies, medications, health status, including review of consultants reports, laboratory and other test data, was performed as part of comprehensive evaluation and provision of chronic care management services.   SDOH:  (Social Determinants of Health) assessments and interventions performed:     CCM Care Plan  Allergies  Allergen Reactions   Metformin And Related     Pt feels that this causes constipation and will not take     Medications Reviewed Today     Reviewed by Cherre Robins, PharmD (Pharmacist) on 06/14/21 at 1602  Med List Status: <None>   Medication Order Taking? Sig Documenting Provider Last Dose Status Informant  amLODipine (NORVASC) 5 MG tablet [id number] Yes Take 1 tablet by mouth once daily Richardson Dopp T, PA-C Taking Active Child  aspirin EC 81 MG EC tablet [id number] Yes Take 1 tablet (81 mg total) by mouth daily. Geradine Girt, DO Taking Active Child  blood glucose meter kit and supplies [id number] Yes Dispense based on patient and insurance preference. Use up to four times daily as directed. (FOR ICD-10 E10.9, E11.9). Copland, Gay Filler, MD Taking Active Child           Med Note Barbaraann Boys Jun 07, 2021  2:11 PM) Patient is using One Touch Ultra glucometer  gabapentin (NEURONTIN) 300 MG capsule [id number] Yes Take 1 capsule (300 mg total) by mouth at bedtime. Copland, Gay Filler, MD Taking Active   glucose blood (ONETOUCH ULTRA) test strip [id number] Yes USE 1 STRIP TO CHECK GLUCOSE TWICE DAILY . Copland, Gay Filler, MD Taking Active   insulin glargine (LANTUS) 100 UNIT/ML injection [id number] Yes Inject 0.22 mLs (22 Units total) into the skin every morning. And pen needles 1/day   Patient taking differently: Inject 27 Units into the skin every morning. And pen needles 1/day   Renato Shin, MD Taking Active   Insulin Pen Needle (TECHLITE PEN NEEDLES) 32G X 8 MM MISC [id number] Yes Use daily to administer insulin Copland, Gay Filler, MD Taking Active   Lancets (ONETOUCH DELICA PLUS [id number]) Divide [id number] Yes SMARTSIG:1 Topical Daily [provider] Taking Active Child  lisinopril (ZESTRIL) 20 MG tablet [id number] Yes Take 1 tablet (20 mg total) by mouth daily. Copland, Gay Filler, MD Taking Active   Magnesium Oxide (MAG-OXIDE) 200 MG TABS [id number] Yes Take 1 tablet (200 mg  total) by mouth daily for 5 days. Sherwood Gambler, MD Taking Active   methocarbamol (ROBAXIN) 500 MG tablet [id number] Yes Take 1 tablet (500 mg total) by mouth every 8 (eight) hours as needed for muscle spasms. Use as needed for shoulder pain Copland, Gay Filler, MD Taking Active   ofloxacin (FLOXIN OTIC) 0.3 % OTIC solution [id number] Yes Place 10 drops into the right ear daily. Use for 5-7 days as needed Copland, Gay Filler, MD Taking Active   potassium chloride SA (KLOR-CON) 20 MEQ tablet [id number] Yes Take 1 tablet (20 mEq total) by mouth 2 (two) times daily for 7 days. Arnaldo Natal, MD Taking Active   rosuvastatin (CRESTOR) 40 MG tablet [id number] Yes Take 1 tablet by mouth once daily Richardson Dopp T, [place] Taking Active Child            Patient Active Problem List   Diagnosis Date Noted   Atherosclerotic peripheral vascular disease with ulceration ([place]) 04/03/2015   Coronary artery disease involving native coronary artery of native heart without angina pectoris 03/28/2014   Aneurysm of iliac artery ([place]) 03/18/2014   Abdominal pain, unspecified site 03/18/2014   Syncope in setting of nausea and vomiting 03/07/14 03/08/2014   Non compliance with medical treatment 02/28/2014   Dyslipidemia, goal LDL below 70 02/28/2014   Bradycardia- beta blocker decreased 02/28/2014   Chronic diastolic  CHF (congestive heart failure) ([place]) 02/28/2014   Type 2 diabetes mellitus with manifestations ([place]) 02/27/2014   TIA (transient ischemic attack) 02/26/2014   History of non-ST elevation myocardial infarction (NSTEMI) 02/26/2014   Weakness-Left arm / Lef 12/06/2013   Mesenteric artery stenosis ([facility]) 12/06/2013   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[place]) 12/03/2012   Hypertension 11/10/2011   History of CVA in adulthood 11/10/2011   Language barrier 11/10/2011    Immunization History  Administered Date(s) Administered   Fluad Quad(high Dose 65+) 07/23/2020   Influenza, High Dose Seasonal PF 11/15/2017, 06/20/2018, 06/07/2021   PFIZER(Purple Top)SARS-COV-2 Vaccination 02/18/2020, 04/06/2020   Pneumococcal Conjugate-13 11/15/2017   Pneumococcal Polysaccharide-23 02/28/2014, 07/23/2020   Tdap 06/07/2021    Conditions to be addressed/monitored: CAD, HTN, HLD, DMII, and PVD, history of CVA  Care Plan : General Pharmacy (Adult)  Updates made by Cherre Robins, PHARMD since 06/18/2021 12:00 AM     Problem: Chronic Care and medication management for: type 2 diabetes, hyperlipidemia; hypertension; CAD with history of CVA and PVD      Long-Range Goal: Provide education and support for medication management and disease state management.   Start Date: 05/28/2021  Priority: High  Note:   Current Barriers:  Unable to independently monitor therapeutic efficacy Unable to achieve control of type 2 diabetes  Does not adhere to prescribed medication regimen Does not maintain contact with provider office Does not contact provider office for questions/concerns Language barrier - patient's primary language is [place]   Pharmacist Clinical Goal(s):  Over the next 90 days, patient will achieve adherence to monitoring guidelines and medication adherence to achieve therapeutic efficacy achieve control of type 2 diabetes as evidenced by A1c < 7.5% maintain control of blood pressure and hyperlipidemia   as evidenced by blood pressure <140/90 and LDL <70  adhere to prescribed medication regimen as evidenced by refill history contact provider office for questions/concerns as evidenced notation of same in electronic health record through collaboration with PharmD and provider.   Interventions: 1:1 collaboration with Copland, Gay Filler, MD regarding development and update of comprehensive plan of care as evidenced by provider attestation  and co-signature Inter-disciplinary care team collaboration (see longitudinal plan of care) Comprehensive medication review performed; medication list updated in electronic medical record  Diabetes: Uncontrolled but improving. A1c from 10/23/2020 which was 10.2% but most recent A1c was 8.7% (06/07/2021) Current treatment: Lantus Insulin - Inject 27 units daily Current glucose readings: checking two times a day but patient's son did not have exact number. Just reported that blood glucose was improved.  Current meal patterns: unable to assess Previous medications tried: glipizide, Victoza Interventions:  Reviewed home blood glucose goals  Fasting blood glucose goal (before meals) = 80 to 130 Blood glucose goal after a meal = less than 180  Review signs and symptoms of low blood glucose (less than 70) and how to treat If patient is having symptoms should check BG, if less than 70, you should eat 15 grams of carbohydrates (4 oz of juice, soda, 4 glucose tablets, or 3-4 pieces of hard candy). Wait 15 minutes and then recheck your blood sugar. If your sugar is still less than 70, eat another 15 grams of carbohydrates. Wait another 15 minutes and recheck your glucose. Continue this until your sugar is over 70. Then, eat a snack with protein in it to prevent your sugar from dropping again. Contact provider if blood glucose is less than 70 more than once per week.  Son to check in on patient today and review home blood glucose readings. Advised if blood glucose over 150,  then increase Lantus to 30 units a day. If blood glucose has been 150 or less, then continue Lantus 27 units a day Verified patient pick up testing supplies and syringe prescriptions that were called to [facility] last week  Hypertension: Blood pressure improving since restarted lisinopril 06/07/21; patient admitted to not taking blood pressure meds are prescribed BP Readings from Last 3 Encounters:  06/10/21 140/79  06/07/21 (!) 142/82  10/23/20 110/60  Current treatment:  Lisinopril 2m daily Amlodipine 521mdaily  Current home readings: none reported Interventions:  Confirmed with [facility] lisinopril and amlodipine refills were picked up Reviewed blood pressure goal  Hyperlipidemia / CAD / PVD / history of CVA Controlled - last LDL was 124 (up from 35 when he was taking rosuvastatin + Praluent)  Current treatment: Rosuvastatin 4060maily Interventions:  Lipids rechecked today.  At last visit coordinated with pharmacy to make sure patient has filled and picked up rosuvastatin. Confirmed with pharmacy both meds were picked up 06/07/2021  Medication Management:  Current pharmacy: [facility] Interventions:  Reviewed refill history Reviewed each medication with patient and wife while in office. Discussed what each medication is for and reason for taking.  [facility] with [facility] confirm that patient has picked up all meds that were sent in last week   Patient Goals/Self-Care Activities Over the next 90 days, patient will:  take medications as prescribed, focus on medication adherence by using reminder boxes if needed, check glucose 1 to 2 times a day, document, and provide at future appointments, and keep appointments with medical providers.   Follow up: 2  to 3 weeks by phone with either wife or son.        Medication Assistance: None required.  Patient affirms current coverage meets needs.  [facility] [place] - [address] [place] ([place] [id number]: [phone]: [phone] Up:  Patient agrees to Care Plan and Follow-up.  Plan: Telephone follow up  appointment with care management team member scheduled for:  2 to 3 weeks  Cherre Robins, PharmD Clinical Pharmacist [facility] [address] [facility]

## 2021-06-18 NOTE — Patient Instructions (Signed)
Visit Information  PATIENT GOALS:  Goals Addressed             This Visit's Progress    Chronic Care Management - Pharmacy Care Plan   On track    Current Barriers:  Unable to independently monitor therapeutic efficacy Unable to achieve control of type 2 diabetes  Does not adhere to prescribed medication regimen Does not maintain contact with provider office Does not contact provider office for questions/concerns Language barrier  Pharmacist Clinical Goal(s):  Over the next 90 days, patient will achieve adherence to monitoring guidelines and medication adherence to achieve therapeutic efficacy achieve control of type 2 diabetes as evidenced by A1c < 7.5% maintain control of blood pressure and hyperlipidemia  as evidenced by blood pressure <140/90 and LDL <70  adhere to prescribed medication regimen as evidenced by refill history contact provider office for questions/concerns as evidenced notation of same in electronic health record through collaboration with PharmD and provider.   Interventions: 1:1 collaboration with Copland, Gay Filler, MD regarding development and update of comprehensive plan of care as evidenced by provider attestation and co-signature Inter-disciplinary care team collaboration (see longitudinal plan of care) Comprehensive medication review performed; medication list updated in electronic medical record  Diabetes: Lab Results  Component Value Date   HGBA1C 8.7 (H) 06/07/2021  Current treatment: Lantus 27 units daily Interventions:  Check blood glucose 1 to 2 times a day and record Reviewed home blood glucose goals  Fasting blood glucose goal (before meals) = 80 to 130 Blood glucose goal after a meal = less than 180  Review signs and symptoms of low blood glucose (less than 70) and how to treat If patient is having symptoms should check blood glucose, if less than 70, you should eat 15 grams of carbohydrates (4 oz of juice, soda, 4 glucose tablets, or 3-4  pieces of hard candy). Wait 15 minutes and then recheck your blood sugar. If your sugar is still less than 70, eat another 15 grams of carbohydrates. Wait another 15 minutes and recheck your glucose. Continue this until your sugar is over 70. Then, eat a snack with protein in it to prevent your sugar from dropping again. Contact provider if blood glucose is less than 70 more than once per week.  Son to verify recent blood glucose readings - if has been over 150, instructed to increase to Lantus 30 units a day. If blood glucose has bee 150 or less, then continue Lantus 27 units a day  Hypertension: Blood pressure was not at goal today (goal <140/90) BP Readings from Last 3 Encounters:  06/10/21 140/79  06/07/21 (!) 142/82  10/23/20 110/60  Current treatment:  Lisinopril 20mg  daily (restarted 06/07/2021) Amlodipine 5mg  daily Interventions:  Continue to take lisinopril and amlodipine daily Discussed benefits of both blood pressure medications and importance in preventing heart disease, stroke and kidney disease. Reviewed blood pressure goal  Hyperlipidemia / CAD / PVD / history of CVA Not at goal of less than 70 Lipid Panel     Component Value Date/Time   CHOL 179 06/07/2021 1355   CHOL 92 (L) 05/11/2020 1020   TRIG 273.0 (H) 06/07/2021 1355   HDL 36.00 (L) 06/07/2021 1355   HDL 34 (L) 05/11/2020 1020   CHOLHDL 5 06/07/2021 1355   VLDL 54.6 (H) 06/07/2021 1355   LDLCALC 35 05/11/2020 1020   LDLDIRECT 124.0 06/07/2021 1355   LABVLDL 23 05/11/2020 1020  Current treatment: Rosuvastatin 40mg  daily (restarted 06/07/21) Interventions:  Removed Praluent  from medication list Continue to take rosuvastatin daily  Medication Management:  Current pharmacy: [facility] Interventions:  Reviewed refill history Discuss importance of taking all his medication every day with patient and his wife.  Will continue to follow up with family to make sure patient is taking medications as prescribed.    Patient Goals/Self-Care Activities Over the next 90 days, patient will: take medications as prescribed,  focus on medication adherence by using reminder boxes if needed,  check glucose 1 to 2 times a day, document, and provide at future appointments, and  keep appointments with medical providers.   Follow Up Plan: phone follow up with son or wife in 2 to 3 weeks.         The patient verbalized understanding of instructions, educational materials, and care plan provided today and declined offer to receive copy of patient instructions, educational materials, and care plan.   Telephone follow up appointment with care management team member scheduled for: 2 to 3 weeks  Cherre Robins, PharmD Clinical Pharmacist [facility] [place] [facility]

## 2021-06-25 DIAGNOSIS — E118 Type 2 diabetes mellitus with unspecified complications: Secondary | ICD-10-CM

## 2021-06-25 DIAGNOSIS — I10 Essential (primary) hypertension: Secondary | ICD-10-CM

## 2021-06-25 DIAGNOSIS — E785 Hyperlipidemia, unspecified: Secondary | ICD-10-CM | POA: Diagnosis not present

## 2021-07-08 NOTE — Progress Notes (Signed)
Cardiology Office Note:    Date:  07/09/2021   ID:  Barry [name], DOB Jul 16, 1951, MRN [id number]  PCP:  Barry Mclean, MD   [facility] Providers Cardiologist:  Sherren Mocha, MD Cardiology APP:  Sharmon Revere     Referring MD: Barry Mclean, MD   Chief Complaint:  F/u for CAD; ED visit with chest pain 05/2021    Patient Profile:   Barry [name] is a 70 y.o. male with:  Coronary artery disease S/p NSTEMI 02/2014>> s/p DES to the proximal RCA Re-Admx 02/2014 w/ [place]>> s/p DES to the LCx Myoview 2/16: Low risk, inferior thinning versus infarct, no ischemia Cath 9/16: LCx, RCA stents ok; OM2 subbranch jailed, oPDA moderate stenosis>> med Rx Chronic superior mesenteric artery dissection Followed by Dr. Trula Slade Peripheral Arterial Disease   L CIA w penetrating ulcer History of stroke in 2011 Diabetes mellitus Hypertension Chronic kidney disease Hyperlipidemia Rosuvastatin; Alirocumab Abdominal aortic aneurysm    Prior CV studies: Echo 10/27/15 Mod LVH, EF 55, inf-lat HK, Gr 1 DD, trivial AI, mild LAE   LHC 9/16 LAD:  Ostial 30%, prox 30%, dist 30%, D1 30% LCx: prox 20%, OM2 stent ok with 10% ISR, small lateral OM2 80% RCA:  Ostial stent ok with 15%, mid 20%, dist 20%, ostial RPDA 60% EF 55-65%   Myoview 2/16 Inferior lateral defect consistent with thinning vs small prior infarct; no ischemia.  EF 49%  History of Present Illness: Barry [name] was last seen in 09/2020 by Barry Kail, PA-C.   He was seen in the emergency room 06/10/2021 with chest pain.  His electrocardiogram did not demonstrate acute changes.  His high-sensitivity troponins were normal.  His potassium was low and this was replaced.  He was discharged home.  He returns for follow-up.  He is here with his wife.  He is seen with the assistance of an interpreter ([place] [phone]).  He requested refill of his nitroglycerin.  He notes that he uses this quite often to alleviate chest pain.  He  describes having occasional symptoms of chest discomfort and heart pounding.  He has associated shortness of breath with this.  He sometimes has symptoms with exertion.  He has relief with nitroglycerin.  History is somewhat difficult to obtain but it sounds as though the patient has had this symptom for years without significant change.  He has not had syncope, orthopnea, leg edema.    Past Medical History:  Diagnosis Date   CAD (coronary artery disease)    a. LHC (02/26/14):  mLAD 20 (faint L>R collats), mCFX 50, pRCA 95 (plaque rupture) >> PCI:  Xience DES to pRCA (severe tortuosity of R innominate artery >> needs L radial or FA in future);  b. LHC (03/10/14):  CFX 90, oOM 70, pRCA stent patent >> PCI:  Xience DES to mCFX    Carotid stenosis    a. Carotid US (02/27/14):  Bilateral ICA 1-39%   Depression    Diabetes mellitus    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[place])    a. Mesenteric Artery Duplex (7/15):  pSMA chronic dissection with aneurysmal dilation of 1.29 cm (VVS);  b.  Chest CTA (02/26/14):  IMPRESSION:  1. No aortic  dissection or other acute abnormality.  2. 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  3. Stable 17 mm ectasia of the left common iliac artery.  4. Atherosclerosis, including aortoiliac and coronary artery disease.    History of echocardiogram 09/2015   Echo 1/17:  mod LVH, EF 55%, inf-lat HK, Gr 1 DD, mild LAE   Hx of cardiovascular stress test    Lexiscan Myoview (2/16):  Inferior lateral defect c/w thinning vs small prior infarct, no ischemia, EF 49%, Low Risk   Hx of echocardiogram    a.  Echocardiogram (02/27/14):  Mod focal basal and mild concentric LVH, EF 50-55%, no RWMA, Gr 1 DD, mild TR, normal RVF   Hyperlipidemia    Hypertension    Iliac artery aneurysm, left (HCC)    Myocardial infarction ([place])    Stroke ([place]) 09/26/2009   Current Medications: Current Meds  Medication Sig   acarbose (PRECOSE) 25 MG tablet Take 25 mg by mouth 3 (three) times daily with  meals.   amLODipine (NORVASC) 5 MG tablet Take 1 tablet by mouth once daily   aspirin EC 81 MG EC tablet Take 1 tablet (81 mg total) by mouth daily.   blood glucose meter kit and supplies Dispense based on patient and insurance preference. Use up to four times daily as directed. (FOR ICD-10 E10.9, E11.9).   gabapentin (NEURONTIN) 300 MG capsule Take 1 capsule (300 mg total) by mouth at bedtime.   glucose blood (ONETOUCH ULTRA) test strip USE 1 STRIP TO CHECK GLUCOSE TWICE DAILY .   insulin glargine (LANTUS) 100 UNIT/ML injection Inject 27 Units into the skin daily.   Insulin Pen Needle (TECHLITE PEN NEEDLES) 32G X 8 MM MISC Use daily to administer insulin   Lancets (ONETOUCH DELICA PLUS [id number]) MISC SMARTSIG:1 Topical Daily   lisinopril (ZESTRIL) 20 MG tablet Take 1 tablet (20 mg total) by mouth daily.   nitroGLYCERIN (NITROSTAT) 0.4 MG SL tablet Place 0.4 mg under the tongue every 5 (five) minutes as needed for chest pain.   rosuvastatin (CRESTOR) 40 MG tablet Take 1 tablet by mouth once daily    Allergies:   Metformin and related   Social History   Tobacco Use   Smoking status: Former    Packs/day: 0.25    Years: 38.00    Pack years: 9.50    Types: Cigarettes    Quit date: 2008    Years since quitting: 14.7   Smokeless tobacco: Never  Vaping Use   Vaping Use: Never used  Substance Use Topics   Alcohol use: No    Alcohol/week: 0.0 standard drinks   Drug use: No    Family Hx: The patient's family history includes Diabetes in his father; Heart attack in his father; Hypertension in his father. There is no history of Stroke.  Review of Systems  Gastrointestinal:  Negative for hematochezia.  Genitourinary:  Negative for hematuria.    EKGs/Labs/Other Test Reviewed:    EKG:  EKG is   ordered today.  The ekg ordered today demonstrates NSR, HR 82, normal axis, nonspecific ST-T wave changes, multiple PVCs, QTC 462, septal Q waves  Recent Labs: 06/07/2021: ALT 21 06/10/2021: BUN  10; Creatinine, Ser 1.03; Hemoglobin 13.6; Platelets 163; Potassium 2.9; Sodium 141   Recent Lipid Panel Lab Results  Component Value Date/Time   CHOL 179 06/07/2021 01:55 PM   CHOL 92 (L) 05/11/2020 10:20 AM   TRIG 273.0 (H) 06/07/2021 01:55 PM   HDL 36.00 (L) 06/07/2021 01:55 PM   HDL 34 (L) 05/11/2020 10:20 AM   LDLCALC 35 05/11/2020 10:20 AM   LDLDIRECT 124.0 06/07/2021 01:55 PM   Risk Assessment/Calculations:          Physical Exam:    VS:  BP (!) 120/56  Pulse 82   Ht _0  (1.651 m)   Wt 161 lb (73 kg)   SpO2 99%   BMI 26.79 kg/m     Wt Readings from Last 3 Encounters:  07/09/21 161 lb (73 kg)  06/10/21 160 lb 0.9 oz (72.6 kg)  06/07/21 160 lb (72.6 kg)    Constitutional:      Appearance: Healthy appearance. Not in distress.  Neck:     Vascular: JVD normal.  Pulmonary:     Effort: Pulmonary effort is normal.     Breath sounds: No wheezing. No rales.  Cardiovascular:     Normal rate. Irregular rhythm. Normal S1. Normal S2.      Murmurs: There is no murmur.  Edema:    Peripheral edema absent.  Abdominal:     Palpations: Abdomen is soft.  Skin:    General: Skin is warm and dry.  Neurological:     General: No focal deficit present.     Mental Status: Alert and oriented to person, place and time.     Cranial Nerves: Cranial nerves are intact.      ASSESSMENT & PLAN:   1. Coronary artery disease involving native coronary artery of native heart with other form of angina pectoris ([place]) 2. Shortness of breath History of myocardial infarction in 2015 treated with DES to the proximal RCA and subsequent DES to the LCx.  Myoview in 2016 was low risk.  Cardiac catheterization in 2016 demonstrated patent stents.  He does have a subbranch of OM 2 which is jailed by the stent and moderate stenosis in the PDA which is managed medically.  Today, he describes symptoms of chest discomfort with exertion as well as associated shortness of breath.  He notes that he takes  nitroglycerin with prompt relief.  After extensive questioning it sounds as though he has had this symptom for quite some time.  In looking at his most recent notes, he has not really described chest discomfort.  He made a recent trip to the emergency room with chest discomfort.  He has a lot of PVCs on his electrocardiogram.  I have recommended proceeding with stress testing to further evaluate.  Given his shortness of breath, PVCs, I have also recommended proceeding with an echocardiogram.  I will see him back in follow-up after the above testing.  -Lexiscan Myoview  -2D echocardiogram   -Start Toprol-XL 25 mg daily  -Continue aspirin 81 mg daily, amlodipine 5 mg daily, rosuvastatin 40 mg daily.  -Consider isosorbide if symptoms are not managed adequately.  -F/U 4-6 weeks  3. PVC's (premature ventricular contractions) He has multiple PVCs on electrocardiogram today.  He had similar findings on the electrocardiogram when he went to the emergency room.  Obtain follow-up BMET, magnesium today.  Obtain ZIO x3 days to quantify PVC burden.  Add beta-blocker therapy as noted above.  4. Essential hypertension Blood pressure well controlled.  Continue amlodipine 5 mg daily, lisinopril 20 mg daily.  If his blood pressure starts to run too low with medical therapy, consider decreasing lisinopril.  5. Mixed hyperlipidemia Continue rosuvastatin 40 mg daily.  Most recent LDL was 124.  His wife notes he had side effects to PCSK9 inhibitor therapy.  We will need to revisit this in the future and decide if he may be a candidate for Inclisiran.  6. Stage 3a chronic kidney disease ([place]) Obtain BMET today.  7. Hypokalemia Obtain follow-up BMET, magnesium today.   Shared Decision Making/Informed Consent The risks [chest pain,  shortness of breath, cardiac arrhythmias, dizziness, blood pressure fluctuations, myocardial infarction, stroke/transient ischemic attack, nausea, vomiting, allergic reaction, radiation  exposure, metallic taste sensation and life-threatening complications (estimated to be 1 in 10,000)], benefits (risk stratification, diagnosing coronary artery disease, treatment guidance) and alternatives of a nuclear stress test were discussed in detail with Mr. Dolce and he agrees to proceed.   Dispo:  No follow-ups on file.   Medication Adjustments/Labs and Tests Ordered: Current medicines are reviewed at length with the patient today.  Concerns regarding medicines are outlined above.  Tests Ordered: No orders of the defined types were placed in this encounter.  Medication Changes: No orders of the defined types were placed in this encounter.  Signed, Richardson Dopp, PA-C  07/09/2021 10:22 AM    [facility] [address], [place],   [address] Phone: [phone]; Fax: [phone]

## 2021-07-09 ENCOUNTER — Ambulatory Visit: Payer: Medicare Other | Admitting: Physician Assistant

## 2021-07-09 ENCOUNTER — Encounter (INDEPENDENT_AMBULATORY_CARE_PROVIDER_SITE_OTHER): Payer: Self-pay

## 2021-07-09 ENCOUNTER — Other Ambulatory Visit: Payer: Self-pay

## 2021-07-09 ENCOUNTER — Encounter: Payer: Self-pay | Admitting: Physician Assistant

## 2021-07-09 VITALS — BP 120/56 | HR 82 | Ht 65.0 in | Wt 161.0 lb

## 2021-07-09 DIAGNOSIS — N1831 Chronic kidney disease, stage 3a: Secondary | ICD-10-CM | POA: Diagnosis not present

## 2021-07-09 DIAGNOSIS — E118 Type 2 diabetes mellitus with unspecified complications: Secondary | ICD-10-CM

## 2021-07-09 DIAGNOSIS — R079 Chest pain, unspecified: Secondary | ICD-10-CM | POA: Diagnosis not present

## 2021-07-09 DIAGNOSIS — I739 Peripheral vascular disease, unspecified: Secondary | ICD-10-CM

## 2021-07-09 DIAGNOSIS — E782 Mixed hyperlipidemia: Secondary | ICD-10-CM

## 2021-07-09 DIAGNOSIS — I25118 Atherosclerotic heart disease of native coronary artery with other forms of angina pectoris: Secondary | ICD-10-CM | POA: Diagnosis not present

## 2021-07-09 DIAGNOSIS — I10 Essential (primary) hypertension: Secondary | ICD-10-CM | POA: Diagnosis not present

## 2021-07-09 DIAGNOSIS — I2510 Atherosclerotic heart disease of native coronary artery without angina pectoris: Secondary | ICD-10-CM

## 2021-07-09 DIAGNOSIS — I493 Ventricular premature depolarization: Secondary | ICD-10-CM | POA: Diagnosis not present

## 2021-07-09 DIAGNOSIS — R0602 Shortness of breath: Secondary | ICD-10-CM | POA: Diagnosis not present

## 2021-07-09 DIAGNOSIS — E876 Hypokalemia: Secondary | ICD-10-CM | POA: Diagnosis not present

## 2021-07-09 DIAGNOSIS — I7779 Dissection of other artery: Secondary | ICD-10-CM

## 2021-07-09 LAB — BASIC METABOLIC PANEL
BUN/Creatinine Ratio: 13 (ref 10–24)
BUN: 17 mg/dL (ref 8–27)
CO2: 27 mmol/L (ref 20–29)
Calcium: 9.7 mg/dL (ref 8.6–10.2)
Chloride: 103 mmol/L (ref 96–106)
Creatinine, Ser: 1.3 mg/dL — ABNORMAL HIGH (ref 0.76–1.27)
Glucose: 162 mg/dL — ABNORMAL HIGH (ref 70–99)
Potassium: 4.5 mmol/L (ref 3.5–5.2)
Sodium: 142 mmol/L (ref 134–144)
eGFR: 59 mL/min/{1.73_m2} — ABNORMAL LOW (ref >59)

## 2021-07-09 LAB — MAGNESIUM: Magnesium: 2.2 mg/dL (ref 1.6–2.3)

## 2021-07-09 MED ORDER — METOPROLOL SUCCINATE ER 25 MG PO TB24: 25.0000 mg | ORAL_TABLET | Freq: Every day | ORAL | 11 refills | Status: DC

## 2021-07-09 MED ORDER — NITROGLYCERIN 0.4 MG SL SUBL: 0.4000 mg | SUBLINGUAL_TABLET | SUBLINGUAL | 2 refills | Status: DC | PRN

## 2021-07-09 NOTE — Patient Instructions (Signed)
Medication Instructions:   START Toprol XL one tablet by mouth ( 25 mg) daily.   *If you need a refill on your cardiac medications before your next appointment, please call your pharmacy*   Lab Work: TODAY!!!!! BMET/MAG If you have labs (blood work) drawn today and your tests are completely normal, you will receive your results only by: [place] (if you have MyChart) OR A paper copy in the mail If you have any lab test that is abnormal or we need to change your treatment, we will call you to review the results.   Testing/Procedures: Your physician has requested that you have an echocardiogram. Echocardiography is a painless test that uses sound waves to create images of your heart. It provides your doctor with information about the size and shape of your heart and how well your heart's chambers and valves are working. This procedure takes approximately one hour. There are no restrictions for this procedure.  You are scheduled for a Myocardial Perfusion Imaging Study on Thursday, October 20 at 7:45 am.   Please arrive 15 minutes prior to your appointment time for registration and insurance purposes.   The test will take approximately 3 to 4 hours to complete; you may bring reading material. If someone comes with you to your appointment, they will need to remain in the main lobby due to limited space in the testing area.   How to prepare for your Myocardial Perfusion test:   Do not eat or drink 3 hours prior to your test, except you may have water.    Do not consume products containing caffeine (regular or decaffeinated) 12 hours prior to your test (ex: coffee, chocolate, soda, tea)   Do bring a list of your current medications with you. If not listed below, you may take your medications as normal.   Bring any held medication to your appointment, as you may be required to take it once the test is complete.   Do wear comfortable clothes (no  overalls) and walking shoes. Tennis  shoes are preferred. No open toed shoes.  Do not wear cologne, aftershave or lotions (deodorant is allowed).   If these instructions are not followed, you test will have to be rescheduled.   Please report to [address] for your test. If you have questions or concerns about your appointment, please call the Nuclear Lab at [phone].  If you cannot keep your appointment, please provide 24 hour notification to the Nuclear lab to avoid a possible $50 charge to your account.    Follow-Up: At [facility], you and your health needs are our priority.  As part of our continuing mission to provide you with exceptional heart care, we have created designated Provider Care Teams.  These Care Teams include your primary Cardiologist (physician) and Advanced Practice Providers (APPs -  Physician Assistants and Nurse Practitioners) who all work together to provide you with the care you need, when you need it.  We recommend signing up for the patient portal called "MyChart".  Sign up information is provided on this After Visit Summary.  MyChart is used to connect with patients for Virtual Visits (Telemedicine).  Patients are able to view lab/test results, encounter notes, upcoming appointments, etc.  Non-urgent messages can be sent to your provider as well.   To learn more about what you can do with MyChart, go to [email or web address].    Your next appointment:   6 week(s)  The format for your next appointment:  In Person  Provider:   Richardson Dopp, PA-C   Other Instructions  You will come in on Monday for monitor.

## 2021-07-12 ENCOUNTER — Ambulatory Visit: Payer: Medicare Other

## 2021-07-12 ENCOUNTER — Encounter: Payer: Self-pay | Admitting: Physician Assistant

## 2021-07-12 ENCOUNTER — Other Ambulatory Visit (INDEPENDENT_AMBULATORY_CARE_PROVIDER_SITE_OTHER): Payer: Medicare Other

## 2021-07-12 ENCOUNTER — Ambulatory Visit (HOSPITAL_COMMUNITY): Payer: Medicare Other | Attending: Internal Medicine

## 2021-07-12 ENCOUNTER — Other Ambulatory Visit: Payer: Self-pay

## 2021-07-12 DIAGNOSIS — I493 Ventricular premature depolarization: Secondary | ICD-10-CM | POA: Diagnosis not present

## 2021-07-12 DIAGNOSIS — R079 Chest pain, unspecified: Secondary | ICD-10-CM | POA: Diagnosis not present

## 2021-07-12 DIAGNOSIS — N1831 Chronic kidney disease, stage 3a: Secondary | ICD-10-CM | POA: Diagnosis not present

## 2021-07-12 DIAGNOSIS — I10 Essential (primary) hypertension: Secondary | ICD-10-CM

## 2021-07-12 DIAGNOSIS — I25118 Atherosclerotic heart disease of native coronary artery with other forms of angina pectoris: Secondary | ICD-10-CM

## 2021-07-12 DIAGNOSIS — E876 Hypokalemia: Secondary | ICD-10-CM | POA: Insufficient documentation

## 2021-07-12 DIAGNOSIS — E782 Mixed hyperlipidemia: Secondary | ICD-10-CM | POA: Diagnosis not present

## 2021-07-12 DIAGNOSIS — R0602 Shortness of breath: Secondary | ICD-10-CM | POA: Diagnosis not present

## 2021-07-12 LAB — ECHOCARDIOGRAM COMPLETE
Area-P 1/2: 3.53 cm2
S' Lateral: 3.8 cm

## 2021-07-12 NOTE — Progress Notes (Unsigned)
3 Day ZIO XT monitor applied from office inventory.

## 2021-07-13 ENCOUNTER — Other Ambulatory Visit: Payer: Self-pay | Admitting: *Deleted

## 2021-07-13 DIAGNOSIS — R7989 Other specified abnormal findings of blood chemistry: Secondary | ICD-10-CM

## 2021-07-13 DIAGNOSIS — I10 Essential (primary) hypertension: Secondary | ICD-10-CM

## 2021-07-13 MED ORDER — LISINOPRIL 20 MG PO TABS: 10.0000 mg | ORAL_TABLET | Freq: Every day | ORAL | 3 refills | Status: DC

## 2021-07-14 ENCOUNTER — Telehealth (HOSPITAL_COMMUNITY): Payer: Self-pay

## 2021-07-14 NOTE — Telephone Encounter (Signed)
Detailed instructions given by the interpreter line, on their answering machine. Asked to call back with any questions. S.Abram Sax EMTP

## 2021-07-15 ENCOUNTER — Ambulatory Visit (HOSPITAL_COMMUNITY): Payer: Medicare Other | Attending: Internal Medicine

## 2021-07-15 ENCOUNTER — Encounter: Payer: Self-pay | Admitting: Physician Assistant

## 2021-07-15 ENCOUNTER — Other Ambulatory Visit: Payer: Self-pay

## 2021-07-15 VITALS — Ht 65.0 in | Wt 161.0 lb

## 2021-07-15 DIAGNOSIS — R079 Chest pain, unspecified: Secondary | ICD-10-CM | POA: Insufficient documentation

## 2021-07-15 DIAGNOSIS — R110 Nausea: Secondary | ICD-10-CM | POA: Diagnosis not present

## 2021-07-15 LAB — MYOCARDIAL PERFUSION IMAGING
LV dias vol: 130 mL (ref 62–150)
LV sys vol: 77 mL
Nuc Stress EF: 41 %
Peak HR: 93 {beats}/min
Rest HR: 83 {beats}/min
Rest Nuclear Isotope Dose: 10.6 mCi
SDS: 8
SRS: 5
SSS: 14
ST Depression (mm): 0 mm
Stress Nuclear Isotope Dose: 30.6 mCi
TID: 1.32

## 2021-07-15 MED ORDER — AMINOPHYLLINE 25 MG/ML IV SOLN
75.0000 mg | Freq: Once | INTRAVENOUS | Status: AC
Start: 2021-07-15 — End: 2021-07-15
  Administered 2021-07-15: 75 mg via INTRAVENOUS

## 2021-07-15 MED ORDER — TECHNETIUM TC 99M TETROFOSMIN IV KIT
10.6000 | PACK | Freq: Once | INTRAVENOUS | Status: AC | PRN
  Administered 2021-07-15: 10.6 via INTRAVENOUS
  Filled 2021-07-15: qty 11

## 2021-07-15 MED ORDER — TECHNETIUM TC 99M TETROFOSMIN IV KIT
30.6000 | PACK | Freq: Once | INTRAVENOUS | Status: AC | PRN
  Administered 2021-07-15: 30.6 via INTRAVENOUS
  Filled 2021-07-15: qty 31

## 2021-07-15 MED ORDER — REGADENOSON 0.4 MG/5ML IV SOLN
0.4000 mg | Freq: Once | INTRAVENOUS | Status: AC
  Administered 2021-07-15: 0.4 mg via INTRAVENOUS

## 2021-07-16 ENCOUNTER — Other Ambulatory Visit: Payer: Self-pay | Admitting: *Deleted

## 2021-07-16 MED ORDER — ISOSORBIDE MONONITRATE ER 30 MG PO TB24: 15.0000 mg | ORAL_TABLET | Freq: Every day | ORAL | 11 refills | Status: DC

## 2021-07-21 ENCOUNTER — Ambulatory Visit: Payer: Medicare Other | Admitting: Physician Assistant

## 2021-07-21 ENCOUNTER — Encounter: Payer: Self-pay | Admitting: Physician Assistant

## 2021-07-21 ENCOUNTER — Telehealth: Payer: Self-pay | Admitting: Cardiovascular Disease

## 2021-07-21 DIAGNOSIS — N183 Chronic kidney disease, stage 3 unspecified: Secondary | ICD-10-CM | POA: Insufficient documentation

## 2021-07-21 DIAGNOSIS — I10 Essential (primary) hypertension: Secondary | ICD-10-CM | POA: Diagnosis not present

## 2021-07-21 DIAGNOSIS — I25118 Atherosclerotic heart disease of native coronary artery with other forms of angina pectoris: Secondary | ICD-10-CM | POA: Diagnosis not present

## 2021-07-21 DIAGNOSIS — E782 Mixed hyperlipidemia: Secondary | ICD-10-CM | POA: Diagnosis not present

## 2021-07-21 DIAGNOSIS — I493 Ventricular premature depolarization: Secondary | ICD-10-CM

## 2021-07-21 HISTORY — DX: Ventricular premature depolarization: I49.3

## 2021-07-21 HISTORY — DX: Chronic kidney disease, stage 3 unspecified: N18.30

## 2021-07-21 NOTE — Progress Notes (Deleted)
Cardiology Office Note:    Date:  07/21/2021   ID:  Barry [name], DOB 16-Mar-1951, MRN [id number]  PCP:  Darreld Mclean, MD   [facility] Providers Cardiologist:  Sherren Mocha, MD Cardiology APP:  Liliane Shi, PA-C { Click to update primary MD,subspecialty MD or APP then REFRESH:1}  *** Referring MD: Darreld Mclean, MD   Chief Complaint:  No chief complaint on file. {Click here for Visit Info    :1}   Patient Profile:   Barry [name] is a 70 y.o. male with:  Coronary artery disease S/p NSTEMI 02/2014>> s/p DES to the proximal RCA Re-Admx 02/2014 w/ [place]>> s/p DES to the LCx Myoview 2/16: Low risk, inferior thinning versus infarct, no ischemia Cath 9/16: LCx, RCA stents ok; OM2 subbranch jailed, oPDA moderate stenosis>> med Rx Chronic superior mesenteric artery dissection Followed by Dr. Trula Slade Peripheral Arterial Disease   L CIA w penetrating ulcer History of stroke in 2011 Diabetes mellitus Hypertension Chronic kidney disease Hyperlipidemia Rosuvastatin; Alirocumab Abdominal aortic aneurysm   History of Present Illness: Barry [name] was last seen 07/09/2021.  He had been to the emergency room in September with chest pain.  His troponins were normal.  He described using nitroglycerin on a regular basis to alleviate chest pain.  At his visit, it seemed that he was describing fairly chronic symptoms.  However, it had been several years since his last assessment for ischemia.  He also had multiple PVCs on electrocardiogram.  I set him up for a Myoview, echocardiogram and 3-day ZIO.  Creatinine was increased on lab work and his lisinopril dose was reduced.  Echocardiogram demonstrated reduced LV function with an EF of 40-45.  And his Myoview was high risk with evidence of anterolateral ischemia.  He is brought back for further evaluation discussed proceeding with cardiac catheterization.    ***  ASSESSMENT & PLAN:   No problem-specific Assessment & Plan notes found for this  encounter. 1. Coronary artery disease involving native coronary artery of native heart with other form of angina pectoris ([place]) 2. Shortness of breath History of myocardial infarction in 2015 treated with DES to the proximal RCA and subsequent DES to the LCx.  Myoview in 2016 was low risk.  Cardiac catheterization in 2016 demonstrated patent stents.  He does have a subbranch of OM 2 which is jailed by the stent and moderate stenosis in the PDA which is managed medically.  Today, he describes symptoms of chest discomfort with exertion as well as associated shortness of breath.  He notes that he takes nitroglycerin with prompt relief.  After extensive questioning it sounds as though he has had this symptom for quite some time.  In looking at his most recent notes, he has not really described chest discomfort.  He made a recent trip to the emergency room with chest discomfort.  He has a lot of PVCs on his electrocardiogram.  I have recommended proceeding with stress testing to further evaluate.  Given his shortness of breath, PVCs, I have also recommended proceeding with an echocardiogram.  I will see him back in follow-up after the above testing.             -Lexiscan Myoview             -2D echocardiogram                -Start Toprol-XL 25 mg daily             -Continue aspirin 81  mg daily, amlodipine 5 mg daily, rosuvastatin 40 mg daily.             -Consider isosorbide if symptoms are not managed adequately.             -F/U 4-6 weeks   3. PVC's (premature ventricular contractions) He has multiple PVCs on electrocardiogram today.  He had similar findings on the electrocardiogram when he went to the emergency room.  Obtain follow-up BMET, magnesium today.  Obtain ZIO x3 days to quantify PVC burden.  Add beta-blocker therapy as noted above.   4. Essential hypertension Blood pressure well controlled.  Continue amlodipine 5 mg daily, lisinopril 20 mg daily.  If his blood pressure starts to run too low  with medical therapy, consider decreasing lisinopril.   5. Mixed hyperlipidemia Continue rosuvastatin 40 mg daily.  Most recent LDL was 124.  His wife notes he had side effects to PCSK9 inhibitor therapy.  We will need to revisit this in the future and decide if he may be a candidate for Inclisiran.   6. Stage 3a chronic kidney disease ([place]) Obtain BMET today.   7. Hypokalemia Obtain follow-up BMET, magnesium today.    {Are you ordering a CV Procedure (e.g. stress test, cath, DCCV, TEE, etc)?   Press F2        :[id number]}  Dispo:  No follow-ups on file.    Prior CV studies: Echocardiogram 07/14/2021  frequent PVCs, EF 40-45, mild LVH, GR 1 DD, mild reduced RVSF, RVSP 26.6, trivial MR, trivial AI, posterior and mid anterolateral HK  Myoview 07/15/2021 Anterolateral ischemia, possible inferior apical infarct with peri-infarct ischemia, frequent PVCs; high risk  Echo 10/27/15 Mod LVH, EF 55, inf-lat HK, Gr 1 DD, trivial AI, mild LAE   LHC 9/16 LAD:  Ostial 30%, prox 30%, dist 30%, D1 30% LCx: prox 20%, OM2 stent ok with 10% ISR, small lateral OM2 80% RCA:  Ostial stent ok with 15%, mid 20%, dist 20%, ostial RPDA 60% EF 55-65%   Myoview 2/16 Inferior lateral defect consistent with thinning vs small prior infarct; no ischemia.  EF 49% {Select studies to display:26339}    Past Medical History:  Diagnosis Date   CAD (coronary artery disease)    a. LHC (02/26/14):  mLAD 20 (faint L>R collats), mCFX 50, pRCA 95 (plaque rupture) >> PCI:  Xience DES to pRCA (severe tortuosity of R innominate artery >> needs L radial or FA in future);  b. LHC (03/10/14):  CFX 90, oOM 70, pRCA stent patent >> PCI:  Xience DES to [facility] // Myoview 10/22: anterolateral ischemia, inferoapical infarct w peri infarct ischemia; EF 41; High Risk   Carotid stenosis    a. Carotid US (02/27/14):  Bilateral ICA 1-39%   Depression    Diabetes mellitus    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[place])    a. Mesenteric Artery Duplex  (7/15):  pSMA chronic dissection with aneurysmal dilation of 1.29 cm (VVS);  b.  Chest CTA (02/26/14):  IMPRESSION:  1. No aortic  dissection or other acute abnormality.  2. 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  3. Stable 17 mm ectasia of the left common iliac artery.  4. Atherosclerosis, including aortoiliac and coronary artery disease.    HFmrEF (heart failure with mildly reduced EF) 09/2015   Echo 1/17: mod LVH, EF 55%, inf-lat HK, Gr 1 DD, mild LAE // Echocardiogram 10/22: EF 40-45, Gr 1 DD, mildly reduced RVSF, RVSP 26.6, trivial MR, trivial AI, post and ant-lat HK  Hx of cardiovascular stress test    Lexiscan Myoview (2/16):  Inferior lateral defect c/w thinning vs small prior infarct, no ischemia, EF 49%, Low Risk   Hx of echocardiogram    a.  Echocardiogram (02/27/14):  Mod focal basal and mild concentric LVH, EF 50-55%, no RWMA, Gr 1 DD, mild TR, normal RVF   Hyperlipidemia    Hypertension    Iliac artery aneurysm, left ([place])    Myocardial infarction [facility])    Stroke ([place]) 09/26/2009   Current Medications: No outpatient medications have been marked as taking for the 07/21/21 encounter (Appointment) with Richardson Dopp T, PA-C.    Allergies:   Metformin and related   Social History   Tobacco Use   Smoking status: Former    Packs/day: 0.25    Years: 38.00    Pack years: 9.50    Types: Cigarettes    Quit date: 2008    Years since quitting: 14.8   Smokeless tobacco: Never  Vaping Use   Vaping Use: Never used  Substance Use Topics   Alcohol use: No    Alcohol/week: 0.0 standard drinks   Drug use: No    Family Hx: The patient's family history includes Diabetes in his father; Heart attack in his father; Hypertension in his father. There is no history of Stroke.  ROS   EKGs/Labs/Other Test Reviewed:    EKG:  EKG is *** ordered today.  The ekg ordered today demonstrates ***  Recent Labs: 06/07/2021: ALT 21 06/10/2021: Hemoglobin 13.6; Platelets 163 07/09/2021:  BUN 17; Creatinine, Ser 1.30; Magnesium 2.2; Potassium 4.5; Sodium 142   Recent Lipid Panel Lab Results  Component Value Date/Time   CHOL 179 06/07/2021 01:55 PM   CHOL 92 (L) 05/11/2020 10:20 AM   TRIG 273.0 (H) 06/07/2021 01:55 PM   HDL 36.00 (L) 06/07/2021 01:55 PM   HDL 34 (L) 05/11/2020 10:20 AM   LDLCALC 35 05/11/2020 10:20 AM   LDLDIRECT 124.0 06/07/2021 01:55 PM     Risk Assessment/Calculations:   {Does this patient have ATRIAL FIBRILLATION?[phone]}      Physical Exam:    VS:  There were no vitals taken for this visit.    Wt Readings from Last 3 Encounters:  07/15/21 161 lb (73 kg)  07/09/21 161 lb (73 kg)  06/10/21 160 lb 0.9 oz (72.6 kg)    Physical Exam ***    Medication Adjustments/Labs and Tests Ordered: Current medicines are reviewed at length with the patient today.  Concerns regarding medicines are outlined above.  Tests Ordered: No orders of the defined types were placed in this encounter.  Medication Changes: No orders of the defined types were placed in this encounter.  Signed, Richardson Dopp, PA-C  07/21/2021 7:55 AM    [facility] [place], [place], [place]  [address] Phone: [phone]; Fax: [phone]

## 2021-07-21 NOTE — Telephone Encounter (Signed)
Per nurse Levy Sjogren pt is needing to be seen for an in office visit to set up a Heart Cath, contacted interpreter line, called patient no answer left voicemail to return call. Please Advise.

## 2021-07-27 NOTE — Telephone Encounter (Signed)
S/w interpreter # [phone] lvm for pt to call office to schedule appointment with Richardson Dopp, PA-C to discuss heart cath. Pt did not show for last appointment.

## 2021-07-29 ENCOUNTER — Other Ambulatory Visit: Payer: Medicare Other

## 2021-07-30 ENCOUNTER — Other Ambulatory Visit: Payer: Medicare Other | Admitting: *Deleted

## 2021-07-30 ENCOUNTER — Other Ambulatory Visit: Payer: Self-pay

## 2021-07-30 DIAGNOSIS — I10 Essential (primary) hypertension: Secondary | ICD-10-CM | POA: Diagnosis not present

## 2021-07-30 DIAGNOSIS — R7989 Other specified abnormal findings of blood chemistry: Secondary | ICD-10-CM

## 2021-07-31 LAB — BASIC METABOLIC PANEL
BUN/Creatinine Ratio: 14 (ref 10–24)
BUN: 18 mg/dL (ref 8–27)
CO2: 27 mmol/L (ref 20–29)
Calcium: 9.4 mg/dL (ref 8.6–10.2)
Chloride: 105 mmol/L (ref 96–106)
Creatinine, Ser: 1.25 mg/dL (ref 0.76–1.27)
Glucose: 143 mg/dL — ABNORMAL HIGH (ref 70–99)
Potassium: 4.1 mmol/L (ref 3.5–5.2)
Sodium: 145 mmol/L — ABNORMAL HIGH (ref 134–144)
eGFR: 62 mL/min/{1.73_m2} (ref >59)

## 2021-08-02 ENCOUNTER — Telehealth: Payer: Self-pay | Admitting: Physician Assistant

## 2021-08-02 NOTE — Telephone Encounter (Signed)
LVM w/ pt to callback we need to reschedule 11/30 appt for something sooner

## 2021-08-04 NOTE — Telephone Encounter (Signed)
His recent cardiac monitor shows a high burden of PVCs (>20%).  We have been trying to reach him to arrange a cardiac catheterization (abnl Myoview).  He was scheduled last week but did not show.  We cannot reach him be phone. PLAN:  1. Please try again to see if we can get reach him and get him scheduled with me sooner [place] I can discuss cardiac catheterization with him. 2. Increase Metoprolol succinate to 50 mg once daily for PVCs 3. Refer to EP for PVCs (appt should be after cardiac catheterization - later in Dec). Richardson Dopp, PA-C    08/04/2021 5:18 PM

## 2021-08-05 NOTE — Telephone Encounter (Signed)
Interpreter # [phone] assisted with call to pt.  Left voicemail message to contact [facility], [place] office at [phone].

## 2021-08-06 NOTE — Telephone Encounter (Signed)
Can we send a certified letter to his home to arrange a f/u? Richardson Dopp, PA-C    08/06/2021 12:54 PM

## 2021-08-10 NOTE — Telephone Encounter (Signed)
Letter has been sent to the interpreting team with [facility]. Once letter has been translated to [place], we will send the letter in the mail via certified mail.

## 2021-08-17 NOTE — Telephone Encounter (Signed)
Letter received from the interpreting team with [facility]. Letter sent to patient via Certified mail, tracking # [id number]  [id number] [id number].

## 2021-08-24 NOTE — Progress Notes (Signed)
Cardiology Office Note:    Date:  08/25/2021   ID:  Barry [name], DOB [birth year], MRN [id number]  PCP:  Darreld Mclean, MD   [facility] Providers Cardiologist:  Sherren Mocha, MD Cardiology APP:  Sharmon Revere    Referring MD: Darreld Mclean, MD   Chief Complaint:  Follow-up for CAD, abnormal stress test, CHF, PVCs    Patient Profile:   Barry [name] is a 70 y.o. male with:  Coronary artery disease S/p NSTEMI 02/2014>> s/p DES to the proximal RCA Re-Admx 02/2014 w/ [place]>> s/p DES to the LCx Myoview 2/16: Low risk, inferior thinning versus infarct, no ischemia Cath 9/16: LCx, RCA stents ok; OM2 subbranch jailed, oPDA moderate stenosis>> med Rx Myoview 10/22: Anterolateral ischemia, EF 41, high risk HFmrEF (heart failure with mildly reduced ejection fraction)  Echo 10/22: EF 40-45 PVCs Monitor 10/22: PVC burden 20% Chronic superior mesenteric artery dissection Followed by Dr. Trula Slade Peripheral Arterial Disease   L CIA w penetrating ulcer History of stroke in 2011 Diabetes mellitus Hypertension Chronic kidney disease Hyperlipidemia Rosuvastatin; Alirocumab Abdominal aortic aneurysm   History of Present Illness: Barry [name] was last seen in 10/22.  He had been seen in the ED with chest pain.  He described symptoms of NTG responsive chest pain.  It was difficult to tell if this was a new symptom or a chronic, stable symptom.  He had several PVCs on his EKG.  I placed him on Metoprolol succinate 25 mg once daily.  A Myoview was obtained and was high risk with ant-lat ischeimc and inf-apical scar with peri-infarct ischemia.  An echocardiogram showed reduced EF at 40-45%.  An event monitor showed a high PVC burden (20%).  He had an appt 2-3 weeks ago to discuss the findings on his tests and to arrange a cardiac catheterization.  However, he did not show to his appt.   We have had significant difficulty reaching him and ultimately sent a certified letter to his home.    He  returns for f/u.  He is here alone.  He is seen with the help of an interpreter.  Since last seen, he still notes occasional chest discomfort and shortness of breath with exertion.  He has relief with nitroglycerin.  He does note fatigue.  He has not had syncope, orthopnea or leg edema.  ASSESSMENT & PLAN:   Coronary artery disease History of myocardial infarction in 2015 treated with DES to the proximal RCA and subsequent DES to the LCx in 2015.  His last cardiac catheterization in 2016 demonstrated patent stents in the RCA and LCx.  He was recently seen for symptoms of exertional angina.  Stress testing demonstrated anterolateral ischemia as well as inferoapical infarct with peri-infarct ischemia.  His EF is reduced and his study was felt to be high risk.  His EF is 40-45 on echocardiogram, which is new.  He also has a high burden of PVCs on recent cardiac monitor (20%).  I have recommended proceeding with cardiac catheterization to rule out ischemia.  I reviewed this with Dr. Burt Knack who agreed.  Of note, he has significant difficulties with his medications.  I reviewed all of his medicines today.  He has rosuvastatin tablets in the bottle labeled amlodipine.  I will reach out to our social worker to see if there is anything that we can do to help.  Continue aspirin 81 mg daily, isosorbide 15 mg daily.  He never started on metoprolol succinate.  I will send  in another prescription for metoprolol succinate 50 mg daily to start now.  Continue rosuvastatin 40 mg daily.  Follow-up with me 2 weeks after cardiac catheterization.  PVC's (premature ventricular contractions) As noted, he had a high burden of PVCs on recent monitor (20%).  He never started on metoprolol succinate.  His blood pressure does tend to run somewhat low.  I will stop his amlodipine.  I will place him on metoprolol succinate 50 mg daily.  When seen in follow-up, I will arrange a follow-up monitor to reassess his PVCs.  If he continues to  have a high burden of PVCs, I will need to refer him to EP.  HFmrEF (heart failure with mildly reduced EF) EF is now 40-45.  He had a high risk Myoview as outlined.  Proceed with cardiac catheterization.  I have taken him off of amlodipine as outlined.  Continue lisinopril 10 mg daily.  Start metoprolol succinate 50 mg daily as outlined.  We can continue to try to titrate GDMT at follow-up.  However, given his difficulties with medications, I think we will try to keep his medical regimen as simple as possible.  Hypertension As noted, his blood pressure is running somewhat low.  I will stop amlodipine and start him on metoprolol succinate 50 mg daily.  Continue isosorbide mononitrate 15 mg daily, lisinopril 10 mg daily.  Dyslipidemia, goal LDL below 70 Continue rosuvastatin 40 mg daily.  Arrange follow-up lipids at next office visit.  CKD (chronic kidney disease) stage 3, GFR 30-59 ml/min ([place]) Obtain BMET today prior to cardiac catheterization.         Shared Decision Making/Informed Consent The risks [stroke (1 in 1000), death (1 in 1000), kidney failure [usually temporary] (1 in 500), bleeding (1 in 200), allergic reaction [possibly serious] (1 in 200)], benefits (diagnostic support and management of coronary artery disease) and alternatives of a cardiac catheterization were discussed in detail with Mr. Duell and he is willing to proceed.   Dispo:  Return in about 2 weeks (around 09/08/2021) for Post Procedure Follow Up, w/ Richardson Dopp, PA-C.    Prior CV studies: Myoview 07/15/21 EF 41, ant-lat ischemia, inf-apical infarct with peri-infarct ischemia; high risk   Cardiac monitor 07/12/21 NSR avg HR 71; no AF/Flutter; no high grade HB or pathologic pauses PVC burden 20%, rare supraventricular beats w/o sustained arrhythmia   Echocardiogram 07/12/21 EF 40-45, mild LVH, Gr 1 DD, mildly reudced RVSF, RVSP 26.6, trivial MR, trivial AI, post and ant-lat HK  Echo 10/27/15 Mod LVH, EF 55,  inf-lat HK, Gr 1 DD, trivial AI, mild LAE   LHC 9/16 LAD:  Ostial 30%, prox 30%, dist 30%, D1 30% LCx: prox 20%, OM2 stent ok with 10% ISR, small lateral OM2 80% RCA:  Ostial stent ok with 15%, mid 20%, dist 20%, ostial RPDA 60% EF 55-65%   Myoview 2/16 Inferior lateral defect consistent with thinning vs small prior infarct; no ischemia.  EF 49%     Past Medical History:  Diagnosis Date   CAD (coronary artery disease)    a. LHC (02/26/14):  mLAD 20 (faint L>R collats), mCFX 50, pRCA 95 (plaque rupture) >> PCI:  Xience DES to pRCA (severe tortuosity of R innominate artery >> needs L radial or FA in future);  b. LHC (03/10/14):  CFX 90, oOM 70, pRCA stent patent >> PCI:  Xience DES to [facility] // Myoview 10/22: anterolateral ischemia, inferoapical infarct w peri infarct ischemia; EF 41; High Risk   Carotid stenosis  a. Carotid US (02/27/14):  Bilateral ICA 1-39%   CKD (chronic kidney disease) stage 3, GFR 30-59 ml/min ([place]) 07/21/2021   Depression    Diabetes mellitus    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[place])    a. Mesenteric Artery Duplex (7/15):  pSMA chronic dissection with aneurysmal dilation of 1.29 cm (V[facility]);  b.  Chest CTA (02/26/14):  IMPRESSION:  1. No aortic  dissection or other acute abnormality.  2. 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  3. Stable 17 mm ectasia of the left common iliac artery.  4. Atherosclerosis, including aortoiliac and coronary artery disease.    HFmrEF (heart failure with mildly reduced EF) 09/2015   Echo 1/17: mod LVH, EF 55%, inf-lat HK, Gr 1 DD, mild LAE // Echocardiogram 10/22: EF 40-45, Gr 1 DD, mildly reduced RVSF, RVSP 26.6, trivial MR, trivial AI, post and ant-lat HK   Hx of cardiovascular stress test    Lexiscan Myoview (2/16):  Inferior lateral defect c/w thinning vs small prior infarct, no ischemia, EF 49%, Low Risk   Hx of echocardiogram    a.  Echocardiogram (02/27/14):  Mod focal basal and mild concentric LVH, EF 50-55%, no RWMA, Gr 1 DD,  mild TR, normal RVF   Hyperlipidemia    Hypertension    Iliac artery aneurysm, left (HCC)    Myocardial infarction ([place])    Stroke ([place]) 09/26/2009   Current Medications: Current Meds  Medication Sig   acarbose (PRECOSE) 25 MG tablet Take 25 mg by mouth 3 (three) times daily with meals.   aspirin EC 81 MG EC tablet Take 1 tablet (81 mg total) by mouth daily.   blood glucose meter kit and supplies Dispense based on patient and insurance preference. Use up to four times daily as directed. (FOR ICD-10 E10.9, E11.9).   gabapentin (NEURONTIN) 300 MG capsule Take 1 capsule (300 mg total) by mouth at bedtime.   glucose blood (ONETOUCH ULTRA) test strip USE 1 STRIP TO CHECK GLUCOSE TWICE DAILY .   insulin glargine (LANTUS) 100 UNIT/ML injection Inject 27 Units into the skin daily.   Insulin Pen Needle (TECHLITE PEN NEEDLES) 32G X 8 MM MISC Use daily to administer insulin   isosorbide mononitrate (IMDUR) 30 MG 24 hr tablet Take 0.5 tablets (15 mg total) by mouth daily.   Lancets (ONETOUCH DELICA PLUS [id number]) MISC SMARTSIG:1 Topical Daily   lisinopril (ZESTRIL) 20 MG tablet Take 0.5 tablets (10 mg total) by mouth daily.   metoprolol succinate (TOPROL-XL) 50 MG 24 hr tablet Take 1 tablet (50 mg total) by mouth daily. Take with or immediately following a meal.   nitroGLYCERIN (NITROSTAT) 0.4 MG SL tablet Place 1 tablet (0.4 mg total) under the tongue every 5 (five) minutes as needed for chest pain.   rosuvastatin (CRESTOR) 40 MG tablet Take 1 tablet by mouth once daily   [DISCONTINUED] amLODipine (NORVASC) 5 MG tablet Take 1 tablet by mouth once daily   [DISCONTINUED] metoprolol succinate (TOPROL XL) 25 MG 24 hr tablet Take 1 tablet (25 mg total) by mouth daily.    Allergies:   Metformin and related   Social History   Tobacco Use   Smoking status: Former    Packs/day: 0.25    Years: 38.00    Pack years: 9.50    Types: Cigarettes    Quit date: 2008    Years since quitting: 14.9    Smokeless tobacco: Never  Vaping Use   Vaping Use: Never used  Substance Use Topics  Alcohol use: No    Alcohol/week: 0.0 standard drinks   Drug use: No    Family Hx: The patient's family history includes Diabetes in his father; Heart attack in his father; Hypertension in his father. There is no history of Stroke.  Review of Systems  Gastrointestinal:  Negative for hematochezia.  Genitourinary:  Negative for hematuria.    EKGs/Labs/Other Test Reviewed:    EKG:  EKG is  ordered today.  The ekg ordered today demonstrates NSR, HR 71, normal axis, nonspecific ST-T wave changes, septal Q waves, QTC 447  Recent Labs: 06/07/2021: ALT 21 07/09/2021: Magnesium 2.2 08/25/2021: BUN 27; Creatinine, Ser 1.35; Hemoglobin 14.5; Platelets 158; Potassium 3.6; Sodium 140   Recent Lipid Panel Lab Results  Component Value Date/Time   CHOL 179 06/07/2021 01:55 PM   CHOL 92 (L) 05/11/2020 10:20 AM   TRIG 273.0 (H) 06/07/2021 01:55 PM   HDL 36.00 (L) 06/07/2021 01:55 PM   HDL 34 (L) 05/11/2020 10:20 AM   LDLCALC 35 05/11/2020 10:20 AM   LDLDIRECT 124.0 06/07/2021 01:55 PM     Risk Assessment/Calculations:          Physical Exam:    VS:  BP 108/62   Pulse 71   Ht _0  (1.651 m)   Wt 167 lb 3.2 oz (75.8 kg)   SpO2 98%   BMI 27.82 kg/m     Wt Readings from Last 3 Encounters:  08/25/21 167 lb 3.2 oz (75.8 kg)  07/15/21 161 lb (73 kg)  07/09/21 161 lb (73 kg)    Constitutional:      Appearance: Healthy appearance. Not in distress.  Neck:     Vascular: No JVR. JVD normal.  Pulmonary:     Effort: Pulmonary effort is normal.     Breath sounds: No wheezing. No rales.  Cardiovascular:     Normal rate. Regular rhythm. Normal S1. Normal S2.      Murmurs: There is no murmur.  Edema:    Peripheral edema absent.  Abdominal:     Palpations: Abdomen is soft.  Skin:    General: Skin is warm and dry.  Neurological:     General: No focal deficit present.     Mental Status: Alert and  oriented to person, place and time.     Cranial Nerves: Cranial nerves are intact.       Medication Adjustments/Labs and Tests Ordered: Current medicines are reviewed at length with the patient today.  Concerns regarding medicines are outlined above.  Tests Ordered: Orders Placed This Encounter  Procedures   Basic metabolic panel   CBC   EKG 12-Lead   Medication Changes: Meds ordered this encounter  Medications   metoprolol succinate (TOPROL-XL) 50 MG 24 hr tablet    Sig: Take 1 tablet (50 mg total) by mouth daily. Take with or immediately following a meal.    Dispense:  90 tablet    Refill:  3   Signed, Richardson Dopp, PA-C  08/25/2021 6:03 PM    [facility] [address], [place], [place]  [address] Phone: [phone]; Fax: [phone]

## 2021-08-24 NOTE — H&P (View-Only) (Signed)
Cardiology Office Note:    Date:  08/25/2021   ID:  Barry [name], DOB [birth year], MRN [id number]  PCP:  Darreld Mclean, MD   [facility] Providers Cardiologist:  Sherren Mocha, MD Cardiology APP:  Sharmon Revere    Referring MD: Darreld Mclean, MD   Chief Complaint:  Follow-up for CAD, abnormal stress test, CHF, PVCs    Patient Profile:   Barry [name] is a 70 y.o. male with:  Coronary artery disease S/p NSTEMI 02/2014>> s/p DES to the proximal RCA Re-Admx 02/2014 w/ [place]>> s/p DES to the LCx Myoview 2/16: Low risk, inferior thinning versus infarct, no ischemia Cath 9/16: LCx, RCA stents ok; OM2 subbranch jailed, oPDA moderate stenosis>> med Rx Myoview 10/22: Anterolateral ischemia, EF 41, high risk HFmrEF (heart failure with mildly reduced ejection fraction)  Echo 10/22: EF 40-45 PVCs Monitor 10/22: PVC burden 20% Chronic superior mesenteric artery dissection Followed by Dr. Trula Slade Peripheral Arterial Disease   L CIA w penetrating ulcer History of stroke in 2011 Diabetes mellitus Hypertension Chronic kidney disease Hyperlipidemia Rosuvastatin; Alirocumab Abdominal aortic aneurysm   History of Present Illness: Barry [name] was last seen in 10/22.  He had been seen in the ED with chest pain.  He described symptoms of NTG responsive chest pain.  It was difficult to tell if this was a new symptom or a chronic, stable symptom.  He had several PVCs on his EKG.  I placed him on Metoprolol succinate 25 mg once daily.  A Myoview was obtained and was high risk with ant-lat ischeimc and inf-apical scar with peri-infarct ischemia.  An echocardiogram showed reduced EF at 40-45%.  An event monitor showed a high PVC burden (20%).  He had an appt 2-3 weeks ago to discuss the findings on his tests and to arrange a cardiac catheterization.  However, he did not show to his appt.   We have had significant difficulty reaching him and ultimately sent a certified letter to his home.    He  returns for f/u.  He is here alone.  He is seen with the help of an interpreter.  Since last seen, he still notes occasional chest discomfort and shortness of breath with exertion.  He has relief with nitroglycerin.  He does note fatigue.  He has not had syncope, orthopnea or leg edema.  ASSESSMENT & PLAN:   Coronary artery disease History of myocardial infarction in 2015 treated with DES to the proximal RCA and subsequent DES to the LCx in 2015.  His last cardiac catheterization in 2016 demonstrated patent stents in the RCA and LCx.  He was recently seen for symptoms of exertional angina.  Stress testing demonstrated anterolateral ischemia as well as inferoapical infarct with peri-infarct ischemia.  His EF is reduced and his study was felt to be high risk.  His EF is 40-45 on echocardiogram, which is new.  He also has a high burden of PVCs on recent cardiac monitor (20%).  I have recommended proceeding with cardiac catheterization to rule out ischemia.  I reviewed this with Dr. Burt Knack who agreed.  Of note, he has significant difficulties with his medications.  I reviewed all of his medicines today.  He has rosuvastatin tablets in the bottle labeled amlodipine.  I will reach out to our social worker to see if there is anything that we can do to help.  Continue aspirin 81 mg daily, isosorbide 15 mg daily.  He never started on metoprolol succinate.  I will send  in another prescription for metoprolol succinate 50 mg daily to start now.  Continue rosuvastatin 40 mg daily.  Follow-up with me 2 weeks after cardiac catheterization.  PVC's (premature ventricular contractions) As noted, he had a high burden of PVCs on recent monitor (20%).  He never started on metoprolol succinate.  His blood pressure does tend to run somewhat low.  I will stop his amlodipine.  I will place him on metoprolol succinate 50 mg daily.  When seen in follow-up, I will arrange a follow-up monitor to reassess his PVCs.  If he continues to  have a high burden of PVCs, I will need to refer him to EP.  HFmrEF (heart failure with mildly reduced EF) EF is now 40-45.  He had a high risk Myoview as outlined.  Proceed with cardiac catheterization.  I have taken him off of amlodipine as outlined.  Continue lisinopril 10 mg daily.  Start metoprolol succinate 50 mg daily as outlined.  We can continue to try to titrate GDMT at follow-up.  However, given his difficulties with medications, I think we will try to keep his medical regimen as simple as possible.  Hypertension As noted, his blood pressure is running somewhat low.  I will stop amlodipine and start him on metoprolol succinate 50 mg daily.  Continue isosorbide mononitrate 15 mg daily, lisinopril 10 mg daily.  Dyslipidemia, goal LDL below 70 Continue rosuvastatin 40 mg daily.  Arrange follow-up lipids at next office visit.  CKD (chronic kidney disease) stage 3, GFR 30-59 ml/min ([place]) Obtain BMET today prior to cardiac catheterization.         Shared Decision Making/Informed Consent The risks [stroke (1 in 1000), death (1 in 1000), kidney failure [usually temporary] (1 in 500), bleeding (1 in 200), allergic reaction [possibly serious] (1 in 200)], benefits (diagnostic support and management of coronary artery disease) and alternatives of a cardiac catheterization were discussed in detail with Barry [name] and he is willing to proceed.   Dispo:  Return in about 2 weeks (around 09/08/2021) for Post Procedure Follow Up, w/ Richardson Dopp, PA-C.    Prior CV studies: Myoview 07/15/21 EF 41, ant-lat ischemia, inf-apical infarct with peri-infarct ischemia; high risk   Cardiac monitor 07/12/21 NSR avg HR 71; no AF/Flutter; no high grade HB or pathologic pauses PVC burden 20%, rare supraventricular beats w/o sustained arrhythmia   Echocardiogram 07/12/21 EF 40-45, mild LVH, Gr 1 DD, mildly reudced RVSF, RVSP 26.6, trivial MR, trivial AI, post and ant-lat HK  Echo 10/27/15 Mod LVH, EF 55,  inf-lat HK, Gr 1 DD, trivial AI, mild LAE   LHC 9/16 LAD:  Ostial 30%, prox 30%, dist 30%, D1 30% LCx: prox 20%, OM2 stent ok with 10% ISR, small lateral OM2 80% RCA:  Ostial stent ok with 15%, mid 20%, dist 20%, ostial RPDA 60% EF 55-65%   Myoview 2/16 Inferior lateral defect consistent with thinning vs small prior infarct; no ischemia.  EF 49%     Past Medical History:  Diagnosis Date   CAD (coronary artery disease)    a. LHC (02/26/14):  mLAD 20 (faint L>R collats), mCFX 50, pRCA 95 (plaque rupture) >> PCI:  Xience DES to pRCA (severe tortuosity of R innominate artery >> needs L radial or FA in future);  b. LHC (03/10/14):  CFX 90, oOM 70, pRCA stent patent >> PCI:  Xience DES to [facility] // Myoview 10/22: anterolateral ischemia, inferoapical infarct w peri infarct ischemia; EF 41; High Risk   Carotid stenosis  a. Carotid US (02/27/14):  Bilateral ICA 1-39%   CKD (chronic kidney disease) stage 3, GFR 30-59 ml/min ([place]) 07/21/2021   Depression    Diabetes mellitus    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[place])    a. Mesenteric Artery Duplex (7/15):  pSMA chronic dissection with aneurysmal dilation of 1.29 cm (V[facility]);  b.  Chest CTA (02/26/14):  IMPRESSION:  1. No aortic  dissection or other acute abnormality.  2. 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  3. Stable 17 mm ectasia of the left common iliac artery.  4. Atherosclerosis, including aortoiliac and coronary artery disease.    HFmrEF (heart failure with mildly reduced EF) 09/2015   Echo 1/17: mod LVH, EF 55%, inf-lat HK, Gr 1 DD, mild LAE // Echocardiogram 10/22: EF 40-45, Gr 1 DD, mildly reduced RVSF, RVSP 26.6, trivial MR, trivial AI, post and ant-lat HK   Hx of cardiovascular stress test    Lexiscan Myoview (2/16):  Inferior lateral defect c/w thinning vs small prior infarct, no ischemia, EF 49%, Low Risk   Hx of echocardiogram    a.  Echocardiogram (02/27/14):  Mod focal basal and mild concentric LVH, EF 50-55%, no RWMA, Gr 1 DD,  mild TR, normal RVF   Hyperlipidemia    Hypertension    Iliac artery aneurysm, left (HCC)    Myocardial infarction ([place])    Stroke ([place]) 09/26/2009   Current Medications: Current Meds  Medication Sig   acarbose (PRECOSE) 25 MG tablet Take 25 mg by mouth 3 (three) times daily with meals.   aspirin EC 81 MG EC tablet Take 1 tablet (81 mg total) by mouth daily.   blood glucose meter kit and supplies Dispense based on patient and insurance preference. Use up to four times daily as directed. (FOR ICD-10 E10.9, E11.9).   gabapentin (NEURONTIN) 300 MG capsule Take 1 capsule (300 mg total) by mouth at bedtime.   glucose blood (ONETOUCH ULTRA) test strip USE 1 STRIP TO CHECK GLUCOSE TWICE DAILY .   insulin glargine (LANTUS) 100 UNIT/ML injection Inject 27 Units into the skin daily.   Insulin Pen Needle (TECHLITE PEN NEEDLES) 32G X 8 MM MISC Use daily to administer insulin   isosorbide mononitrate (IMDUR) 30 MG 24 hr tablet Take 0.5 tablets (15 mg total) by mouth daily.   Lancets (ONETOUCH DELICA PLUS [id number]) MISC SMARTSIG:1 Topical Daily   lisinopril (ZESTRIL) 20 MG tablet Take 0.5 tablets (10 mg total) by mouth daily.   metoprolol succinate (TOPROL-XL) 50 MG 24 hr tablet Take 1 tablet (50 mg total) by mouth daily. Take with or immediately following a meal.   nitroGLYCERIN (NITROSTAT) 0.4 MG SL tablet Place 1 tablet (0.4 mg total) under the tongue every 5 (five) minutes as needed for chest pain.   rosuvastatin (CRESTOR) 40 MG tablet Take 1 tablet by mouth once daily   [DISCONTINUED] amLODipine (NORVASC) 5 MG tablet Take 1 tablet by mouth once daily   [DISCONTINUED] metoprolol succinate (TOPROL XL) 25 MG 24 hr tablet Take 1 tablet (25 mg total) by mouth daily.    Allergies:   Metformin and related   Social History   Tobacco Use   Smoking status: Former    Packs/day: 0.25    Years: 38.00    Pack years: 9.50    Types: Cigarettes    Quit date: 2008    Years since quitting: 14.9    Smokeless tobacco: Never  Vaping Use   Vaping Use: Never used  Substance Use Topics  Alcohol use: No    Alcohol/week: 0.0 standard drinks   Drug use: No    Family Hx: The patient's family history includes Diabetes in his father; Heart attack in his father; Hypertension in his father. There is no history of Stroke.  Review of Systems  Gastrointestinal:  Negative for hematochezia.  Genitourinary:  Negative for hematuria.    EKGs/Labs/Other Test Reviewed:    EKG:  EKG is  ordered today.  The ekg ordered today demonstrates NSR, HR 71, normal axis, nonspecific ST-T wave changes, septal Q waves, QTC 447  Recent Labs: 06/07/2021: ALT 21 07/09/2021: Magnesium 2.2 08/25/2021: BUN 27; Creatinine, Ser 1.35; Hemoglobin 14.5; Platelets 158; Potassium 3.6; Sodium 140   Recent Lipid Panel Lab Results  Component Value Date/Time   CHOL 179 06/07/2021 01:55 PM   CHOL 92 (L) 05/11/2020 10:20 AM   TRIG 273.0 (H) 06/07/2021 01:55 PM   HDL 36.00 (L) 06/07/2021 01:55 PM   HDL 34 (L) 05/11/2020 10:20 AM   LDLCALC 35 05/11/2020 10:20 AM   LDLDIRECT 124.0 06/07/2021 01:55 PM     Risk Assessment/Calculations:          Physical Exam:    VS:  BP 108/62   Pulse 71   Ht _0  (1.651 m)   Wt 167 lb 3.2 oz (75.8 kg)   SpO2 98%   BMI 27.82 kg/m     Wt Readings from Last 3 Encounters:  08/25/21 167 lb 3.2 oz (75.8 kg)  07/15/21 161 lb (73 kg)  07/09/21 161 lb (73 kg)    Constitutional:      Appearance: Healthy appearance. Not in distress.  Neck:     Vascular: No JVR. JVD normal.  Pulmonary:     Effort: Pulmonary effort is normal.     Breath sounds: No wheezing. No rales.  Cardiovascular:     Normal rate. Regular rhythm. Normal S1. Normal S2.      Murmurs: There is no murmur.  Edema:    Peripheral edema absent.  Abdominal:     Palpations: Abdomen is soft.  Skin:    General: Skin is warm and dry.  Neurological:     General: No focal deficit present.     Mental Status: Alert and  oriented to person, place and time.     Cranial Nerves: Cranial nerves are intact.       Medication Adjustments/Labs and Tests Ordered: Current medicines are reviewed at length with the patient today.  Concerns regarding medicines are outlined above.  Tests Ordered: Orders Placed This Encounter  Procedures   Basic metabolic panel   CBC   EKG 12-Lead   Medication Changes: Meds ordered this encounter  Medications   metoprolol succinate (TOPROL-XL) 50 MG 24 hr tablet    Sig: Take 1 tablet (50 mg total) by mouth daily. Take with or immediately following a meal.    Dispense:  90 tablet    Refill:  3   Signed, Richardson Dopp, PA-C  08/25/2021 6:03 PM    [facility] [address], [place], [place]  [address] Phone: [phone]; Fax: [phone]

## 2021-08-25 ENCOUNTER — Ambulatory Visit: Payer: Medicare Other | Admitting: Physician Assistant

## 2021-08-25 ENCOUNTER — Encounter: Payer: Self-pay | Admitting: Physician Assistant

## 2021-08-25 ENCOUNTER — Other Ambulatory Visit: Payer: Self-pay

## 2021-08-25 VITALS — BP 108/62 | HR 71 | Ht 65.0 in | Wt 167.2 lb

## 2021-08-25 DIAGNOSIS — E785 Hyperlipidemia, unspecified: Secondary | ICD-10-CM

## 2021-08-25 DIAGNOSIS — I25119 Atherosclerotic heart disease of native coronary artery with unspecified angina pectoris: Secondary | ICD-10-CM

## 2021-08-25 DIAGNOSIS — I5020 Unspecified systolic (congestive) heart failure: Secondary | ICD-10-CM

## 2021-08-25 DIAGNOSIS — I493 Ventricular premature depolarization: Secondary | ICD-10-CM | POA: Diagnosis not present

## 2021-08-25 DIAGNOSIS — I10 Essential (primary) hypertension: Secondary | ICD-10-CM | POA: Diagnosis not present

## 2021-08-25 DIAGNOSIS — N1831 Chronic kidney disease, stage 3a: Secondary | ICD-10-CM | POA: Diagnosis not present

## 2021-08-25 LAB — BASIC METABOLIC PANEL
BUN/Creatinine Ratio: 20 (ref 10–24)
BUN: 27 mg/dL (ref 8–27)
CO2: 31 mmol/L — ABNORMAL HIGH (ref 20–29)
Calcium: 11.1 mg/dL — ABNORMAL HIGH (ref 8.6–10.2)
Chloride: 99 mmol/L (ref 96–106)
Creatinine, Ser: 1.35 mg/dL — ABNORMAL HIGH (ref 0.76–1.27)
Glucose: 61 mg/dL — ABNORMAL LOW (ref 70–99)
Potassium: 3.6 mmol/L (ref 3.5–5.2)
Sodium: 140 mmol/L (ref 134–144)
eGFR: 56 mL/min/{1.73_m2} — ABNORMAL LOW (ref >59)

## 2021-08-25 LAB — CBC
Hematocrit: 43.8 % (ref 37.5–51.0)
Hemoglobin: 14.5 g/dL (ref 13.0–17.7)
MCH: 28.5 pg (ref 26.6–33.0)
MCHC: 33.1 g/dL (ref 31.5–35.7)
MCV: 86 fL (ref 79–97)
Platelets: 158 10*3/uL (ref 150–450)
RBC: 5.09 x10E6/uL (ref 4.14–5.80)
RDW: 14.3 % (ref 11.6–15.4)
WBC: 8.8 10*3/uL (ref 3.4–10.8)

## 2021-08-25 MED ORDER — METOPROLOL SUCCINATE ER 50 MG PO TB24: 50.0000 mg | ORAL_TABLET | Freq: Every day | ORAL | 3 refills | Status: DC

## 2021-08-25 NOTE — Patient Instructions (Signed)
Medication Instructions:  1.Stop amlodipine 2.Start metoprolol succinate 50 mg (Toprol XL), take one tablet by mouth daily *If you need a refill on your cardiac medications before your next appointment, please call your pharmacy*   Lab Work: BMET and CBC today If you have labs (blood work) drawn today and your tests are completely normal, you will receive your results only by: [place] (if you have MyChart) OR A paper copy in the mail If you have any lab test that is abnormal or we need to change your treatment, we will call you to review the results.   Testing/Procedures:  [facility] OFFICE [place], [place] [address] Dept: [place]: [phone]  Erskine Boreman  08/25/2021  You are scheduled for a Cardiac Catheterization on Tuesday, December 6 with Dr. Daneen Schick.  1. Please arrive at the [facility] (Main Entrance A) at [facility]: [address] [place], [place] [address] at 5:30 AM (This time is two hours before your procedure to ensure your preparation). Free valet parking service is available.   Special note: Every effort is made to have your procedure done on time. Please understand that emergencies sometimes delay scheduled procedures.  2. Diet: Do not eat solid foods after midnight.  The patient may have clear liquids until 5am upon the day of the procedure.  3. Labs: You will need to have blood drawn today  4. Medication instructions in preparation for your procedure:   Contrast Allergy: No    Take only 1/2 dose of insulin the night before your procedure. Do not take any insulin on the day of the procedure.    On the morning of your procedure, take Aspirin 81 mg and any morning medicines NOT listed above.  You may use sips of water.  5. Plan for one night stay--bring personal belongings. 6. Bring a current list of your medications and current  insurance cards. 7. You MUST have a responsible person to drive you home. 8. Someone MUST be with you the first 24 hours after you arrive home or your discharge will be delayed. 9. Please wear clothes that are easy to get on and off and wear slip-on shoes.  Thank you for allowing [place] to care for you!   -- [facility]   Follow-Up: At [facility], you and your health needs are our priority.  As part of our continuing mission to provide you with exceptional heart care, we have created designated Provider Care Teams.  These Care Teams include your primary Cardiologist (physician) and Advanced Practice Providers (APPs -  Physician Assistants and Nurse Practitioners) who all work together to provide you with the care you need, when you need it.  We recommend signing up for the patient portal called "MyChart".  Sign up information is provided on this After Visit Summary.  MyChart is used to connect with patients for Virtual Visits (Telemedicine).  Patients are able to view lab/test results, encounter notes, upcoming appointments, etc.  Non-urgent messages can be sent to your provider as well.   To learn more about what you can do with MyChart, go to [email or web address].    Your next appointment:   2 week(s)  The format for your next appointment:   In Person  Provider:   Richardson Dopp, PA-C

## 2021-08-25 NOTE — Assessment & Plan Note (Signed)
EF is now 40-45.  He had a high risk Myoview as outlined.  Proceed with cardiac catheterization.  I have taken him off of amlodipine as outlined.  Continue lisinopril 10 mg daily.  Start metoprolol succinate 50 mg daily as outlined.  We can continue to try to titrate GDMT at follow-up.  However, given his difficulties with medications, I think we will try to keep his medical regimen as simple as possible.

## 2021-08-25 NOTE — Assessment & Plan Note (Signed)
History of myocardial infarction in 2015 treated with DES to the proximal RCA and subsequent DES to the LCx in 2015.  His last cardiac catheterization in 2016 demonstrated patent stents in the RCA and LCx.  He was recently seen for symptoms of exertional angina.  Stress testing demonstrated anterolateral ischemia as well as inferoapical infarct with peri-infarct ischemia.  His EF is reduced and his study was felt to be high risk.  His EF is 40-45 on echocardiogram, which is new.  He also has a high burden of PVCs on recent cardiac monitor (20%).  I have recommended proceeding with cardiac catheterization to rule out ischemia.  I reviewed this with Dr. Burt Knack who agreed.  Of note, he has significant difficulties with his medications.  I reviewed all of his medicines today.  He has rosuvastatin tablets in the bottle labeled amlodipine.  I will reach out to our social worker to see if there is anything that we can do to help.  Continue aspirin 81 mg daily, isosorbide 15 mg daily.  He never started on metoprolol succinate.  I will send in another prescription for metoprolol succinate 50 mg daily to start now.  Continue rosuvastatin 40 mg daily.  Follow-up with me 2 weeks after cardiac catheterization.

## 2021-08-25 NOTE — Assessment & Plan Note (Signed)
As noted, his blood pressure is running somewhat low.  I will stop amlodipine and start him on metoprolol succinate 50 mg daily.  Continue isosorbide mononitrate 15 mg daily, lisinopril 10 mg daily.

## 2021-08-25 NOTE — Assessment & Plan Note (Signed)
Continue rosuvastatin 40 mg daily.  Arrange follow-up lipids at next office visit.

## 2021-08-25 NOTE — Assessment & Plan Note (Signed)
As noted, he had a high burden of PVCs on recent monitor (20%).  He never started on metoprolol succinate.  His blood pressure does tend to run somewhat low.  I will stop his amlodipine.  I will place him on metoprolol succinate 50 mg daily.  When seen in follow-up, I will arrange a follow-up monitor to reassess his PVCs.  If he continues to have a high burden of PVCs, I will need to refer him to EP.

## 2021-08-25 NOTE — Assessment & Plan Note (Signed)
Obtain BMET today prior to cardiac catheterization.

## 2021-08-27 ENCOUNTER — Telehealth: Payer: Self-pay | Admitting: Physician Assistant

## 2021-08-27 NOTE — Telephone Encounter (Signed)
Went over pre cath instructions with the patients son (DPR)  Verbalized understanding.

## 2021-08-27 NOTE — Telephone Encounter (Signed)
Pts son is calling for info on dads upcoming appointment... son speaks english.. dont call with a translator son will disconnect the call. Please advise

## 2021-08-30 ENCOUNTER — Telehealth: Payer: Self-pay | Admitting: *Deleted

## 2021-08-30 NOTE — Telephone Encounter (Signed)
Cardiac catheterization scheduled at [facility] for: Tuesday August 31, 2021 7:30 AM [facility] Main Entrance A [facility]) at: 5:30 AM   Diet-no solid food after midnight prior to cath, clear liquids until 5 AM day of procedure.  Medication instructions for procedure: Hold: Insulin-AM of procedure/1/2 usual Insulin HS prior to procedure Lisinopril-AM of procedure-GFR 56-per protocol  Except hold medications morning medications can be taken pre-cath with sips of water including aspirin 81 mg.    Confirmed patient has responsible adult to drive home post procedure and be with patient first 24 hours after arriving home.  [facility] does allow one visitor to accompany you and wait in the hospital waiting room while you are there for your procedure. You and your visitor will be asked to wear a mask once you enter the hospital.   Patient reports does not currently have any new symptoms concerning for COVID-19 and no household members with COVID-19 like illness.    Reviewed procedure/mask/visitor instructions with patient's son (DPR), Pros-he speaks and understands [place].  I asked Pros to send list of medications and last time taken with patient to hospital. He asked me to make note that only medication patient is going to take in morning is aspirin 81 mg.

## 2021-08-30 NOTE — H&P (Signed)
Prior RCA and Cfx stents 2015. Angina with Myoview graded as high risk. Prior difficulty with right radial approach due to innominate tortuosity. CKD 3a Plan cor angio possible PCI right femoral.

## 2021-08-31 ENCOUNTER — Encounter (HOSPITAL_COMMUNITY): Payer: Self-pay | Admitting: Interventional Cardiology

## 2021-08-31 ENCOUNTER — Encounter (HOSPITAL_COMMUNITY)
Admission: RE | Disposition: A | Discharge status: Home / Self Care | Payer: Self-pay | Source: Home / Self Care | Attending: Interventional Cardiology

## 2021-08-31 ENCOUNTER — Ambulatory Visit (HOSPITAL_COMMUNITY)
Admission: RE | Admit: 2021-08-31 | Discharge: 2021-08-31 | Disposition: A | Discharge status: Home / Self Care | Payer: Medicare Other | Attending: Interventional Cardiology | Admitting: Interventional Cardiology

## 2021-08-31 DIAGNOSIS — R9439 Abnormal result of other cardiovascular function study: Secondary | ICD-10-CM

## 2021-08-31 DIAGNOSIS — Z8673 Personal history of transient ischemic attack (TIA), and cerebral infarction without residual deficits: Secondary | ICD-10-CM

## 2021-08-31 DIAGNOSIS — I25119 Atherosclerotic heart disease of native coronary artery with unspecified angina pectoris: Secondary | ICD-10-CM | POA: Insufficient documentation

## 2021-08-31 DIAGNOSIS — E785 Hyperlipidemia, unspecified: Secondary | ICD-10-CM | POA: Diagnosis present

## 2021-08-31 DIAGNOSIS — Z955 Presence of coronary angioplasty implant and graft: Secondary | ICD-10-CM | POA: Insufficient documentation

## 2021-08-31 DIAGNOSIS — N183 Chronic kidney disease, stage 3 unspecified: Secondary | ICD-10-CM | POA: Diagnosis present

## 2021-08-31 DIAGNOSIS — I2582 Chronic total occlusion of coronary artery: Secondary | ICD-10-CM | POA: Insufficient documentation

## 2021-08-31 DIAGNOSIS — E1122 Type 2 diabetes mellitus with diabetic chronic kidney disease: Secondary | ICD-10-CM | POA: Diagnosis not present

## 2021-08-31 DIAGNOSIS — I509 Heart failure, unspecified: Secondary | ICD-10-CM | POA: Insufficient documentation

## 2021-08-31 DIAGNOSIS — I2510 Atherosclerotic heart disease of native coronary artery without angina pectoris: Secondary | ICD-10-CM | POA: Diagnosis present

## 2021-08-31 DIAGNOSIS — I10 Essential (primary) hypertension: Secondary | ICD-10-CM | POA: Diagnosis present

## 2021-08-31 DIAGNOSIS — I5020 Unspecified systolic (congestive) heart failure: Secondary | ICD-10-CM

## 2021-08-31 DIAGNOSIS — E118 Type 2 diabetes mellitus with unspecified complications: Secondary | ICD-10-CM | POA: Diagnosis present

## 2021-08-31 DIAGNOSIS — N1831 Chronic kidney disease, stage 3a: Secondary | ICD-10-CM | POA: Insufficient documentation

## 2021-08-31 DIAGNOSIS — I7779 Dissection of other artery: Secondary | ICD-10-CM | POA: Diagnosis present

## 2021-08-31 HISTORY — PX: LEFT HEART CATH AND CORONARY ANGIOGRAPHY: CATH118249

## 2021-08-31 LAB — GLUCOSE, CAPILLARY
Glucose-Capillary: 102 mg/dL — ABNORMAL HIGH (ref 70–99)
Glucose-Capillary: 83 mg/dL (ref 70–99)

## 2021-08-31 SURGERY — LEFT HEART CATH AND CORONARY ANGIOGRAPHY
Anesthesia: LOCAL

## 2021-08-31 MED ORDER — MIDAZOLAM HCL 2 MG/2ML IJ SOLN
INTRAMUSCULAR | Status: AC
  Filled 2021-08-31: qty 2

## 2021-08-31 MED ORDER — HEPARIN (PORCINE) IN NACL 1000-0.9 UT/500ML-% IV SOLN
INTRAVENOUS | Status: AC
  Filled 2021-08-31: qty 500

## 2021-08-31 MED ORDER — LIDOCAINE HCL (PF) 1 % IJ SOLN
INTRAMUSCULAR | Status: DC | PRN
  Administered 2021-08-31: 10 mL

## 2021-08-31 MED ORDER — SODIUM CHLORIDE 0.9 % WEIGHT BASED INFUSION: 1.0000 mL/kg/h | INTRAVENOUS | Status: DC

## 2021-08-31 MED ORDER — FENTANYL CITRATE (PF) 100 MCG/2ML IJ SOLN
INTRAMUSCULAR | Status: AC
  Filled 2021-08-31: qty 2

## 2021-08-31 MED ORDER — SODIUM CHLORIDE 0.9 % IV SOLN: 250.0000 mL | INTRAVENOUS | Status: DC | PRN

## 2021-08-31 MED ORDER — SODIUM CHLORIDE 0.9 % WEIGHT BASED INFUSION
3.0000 mL/kg/h | INTRAVENOUS | Status: AC
  Administered 2021-08-31: 3 mL/kg/h via INTRAVENOUS

## 2021-08-31 MED ORDER — MIDAZOLAM HCL 2 MG/2ML IJ SOLN
INTRAMUSCULAR | Status: DC | PRN
  Administered 2021-08-31: 1 mg via INTRAVENOUS

## 2021-08-31 MED ORDER — SODIUM CHLORIDE 0.9% FLUSH: 3.0000 mL | INTRAVENOUS | Status: DC | PRN

## 2021-08-31 MED ORDER — ACETAMINOPHEN 325 MG PO TABS: 650.0000 mg | ORAL_TABLET | ORAL | Status: DC | PRN

## 2021-08-31 MED ORDER — OXYCODONE HCL 5 MG PO TABS: 5.0000 mg | ORAL_TABLET | ORAL | Status: DC | PRN

## 2021-08-31 MED ORDER — ONDANSETRON HCL 4 MG/2ML IJ SOLN: 4.0000 mg | Freq: Four times a day (QID) | INTRAMUSCULAR | Status: DC | PRN

## 2021-08-31 MED ORDER — SODIUM CHLORIDE 0.9 % IV SOLN: INTRAVENOUS | Status: DC

## 2021-08-31 MED ORDER — ISOSORBIDE MONONITRATE ER 30 MG PO TB24: 30.0000 mg | ORAL_TABLET | Freq: Every day | ORAL | 11 refills | Status: DC

## 2021-08-31 MED ORDER — SODIUM CHLORIDE 0.9% FLUSH: 3.0000 mL | Freq: Two times a day (BID) | INTRAVENOUS | Status: DC

## 2021-08-31 MED ORDER — ASPIRIN 81 MG PO CHEW: 81.0000 mg | CHEWABLE_TABLET | ORAL | Status: DC

## 2021-08-31 MED ORDER — LIDOCAINE HCL (PF) 1 % IJ SOLN
INTRAMUSCULAR | Status: AC
  Filled 2021-08-31: qty 30

## 2021-08-31 MED ORDER — IOHEXOL 350 MG/ML SOLN
INTRAVENOUS | Status: DC | PRN
  Administered 2021-08-31: 85 mL via INTRA_ARTERIAL

## 2021-08-31 MED ORDER — HYDRALAZINE HCL 20 MG/ML IJ SOLN: 10.0000 mg | INTRAMUSCULAR | Status: DC | PRN

## 2021-08-31 MED ORDER — HEPARIN (PORCINE) IN NACL 1000-0.9 UT/500ML-% IV SOLN
INTRAVENOUS | Status: DC | PRN
  Administered 2021-08-31 (×2): 500 mL

## 2021-08-31 MED ORDER — LABETALOL HCL 5 MG/ML IV SOLN: 10.0000 mg | INTRAVENOUS | Status: DC | PRN

## 2021-08-31 MED ORDER — ASPIRIN 81 MG PO CHEW: 81.0000 mg | CHEWABLE_TABLET | Freq: Every day | ORAL | Status: DC

## 2021-08-31 MED ORDER — FENTANYL CITRATE (PF) 100 MCG/2ML IJ SOLN
INTRAMUSCULAR | Status: DC | PRN
  Administered 2021-08-31: 25 ug via INTRAVENOUS

## 2021-08-31 SURGICAL SUPPLY — 9 items
CATH INFINITI 5FR JL4 (CATHETERS) ×2 IMPLANT
CATH INFINITI JR4 5F (CATHETERS) ×2 IMPLANT
KIT HEART LEFT (KITS) ×2 IMPLANT
PACK CARDIAC CATHETERIZATION (CUSTOM PROCEDURE TRAY) ×2 IMPLANT
SHEATH PINNACLE 5F 10CM (SHEATH) ×2 IMPLANT
SHEATH PROBE COVER 6X72 (BAG) ×2 IMPLANT
TRANSDUCER W/STOPCOCK (MISCELLANEOUS) ×2 IMPLANT
TUBING CIL FLEX 10 FLL-RA (TUBING) ×2 IMPLANT
WIRE EMERALD 3MM-J .035X150CM (WIRE) ×2 IMPLANT

## 2021-08-31 NOTE — CV Procedure (Signed)
Total occlusion mid circumflex, in-stent.  Right to left collaterals.  Left to left collaterals. Ostial to proximal 50% LAD.  LAD proximal to mid 80% stenosis.  First diagonal, moderate in size with tandem ostial and proximal 80 and 95% stenoses. Generalized mid to distal left main 30% narrowing RCA with dominant distribution, with proximal stent containing 30 to 40% in-stent restenosis, de novo 65 to 75% distal stenosis.  PDA is small.  Continuation of RCA to a large left ventricular branch contains mid continuation 40 to 50% narrowing.  A small third left ventricular branch contains ostial 80% stenosis. Left ventricular dysfunction with anteroapical hypokinesis.  LVEDP is normal.  Estimated EF 40%  RECOMMENDATION: Consideration of multivessel coronary artery bypass grafting in a diabetic with decreased LV function and severe two-vessel disease.  RCA contains moderate disease.

## 2021-08-31 NOTE — Interval H&P Note (Signed)
Cath Lab Visit (complete for each Cath Lab visit)  Clinical Evaluation Leading to the Procedure:   ACS: No.  Non-ACS:    Anginal Classification: CCS II  Anti-ischemic medical therapy: Minimal Therapy (1 class of medications)  Non-Invasive Test Results: High-risk stress test findings: cardiac mortality >3%/year  Prior CABG: No previous CABG      History and Physical Interval Note:  08/31/2021 7:06 AM  Barry [name]  has presented today for surgery, with the diagnosis of angina - heart failure.  The various methods of treatment have been discussed with the patient and family. After consideration of risks, benefits and other options for treatment, the patient has consented to  Procedure(s): LEFT HEART CATH AND CORONARY ANGIOGRAPHY (N/A) as a surgical intervention.  The patient's history has been reviewed, patient examined, no change in status, stable for surgery.  I have reviewed the patient's chart and labs.  Questions were answered to the patient's satisfaction.     Barry [name]

## 2021-09-01 NOTE — Progress Notes (Signed)
[address]       [place] [address]             [phone]        [facility] Trevante [facility] Medical Record #[id number] Date of Birth: July 03, 1951  Referring: Belva Crome, MD Primary Care: Lorelei Pont Gay Filler, MD Primary Cardiologist:Michael Burt Knack, MD  Chief Complaint:    Chief Complaint  Patient presents with   Coronary Artery Disease    Surgical consult, Cardiac Cath 08/31/21, ECHO 07/21/21    History of Present Illness:     70 year old male referred by Dr. Tamala Julian for surgical evaluation of three-vessel coronary artery disease.  He does have a history of PCI in 2015 with stent placement to the RCA and circumflex system.  Most recent left heart cath in 2016 showed that the stents were patent.  He has been having some anginal symptoms and has come to the emergency department for evaluation of this.  He recently underwent another left heart cath on 08/31/2021 which showed LAD, diagonal, and distal RCA disease.  He has an in-stent restenosis of his circumflex stent.  He denies any chest pain or shortness of breath but admits to fatigability.   Past Medical and Surgical History: Previous Chest Surgery: No Previous Chest Radiation: No Diabetes Mellitus: Yes.  HbA1C 8.7 Creatinine: 1.35  Past Medical History:  Diagnosis Date   CAD (coronary artery disease)    a. LHC (02/26/14):  mLAD 20 (faint L>R collats), mCFX 50, pRCA 95 (plaque rupture) >> PCI:  Xience DES to pRCA (severe tortuosity of R innominate artery >> needs L radial or FA in future);  b. LHC (03/10/14):  CFX 90, oOM 70, pRCA stent patent >> PCI:  Xience DES to [facility] // Myoview 10/22: anterolateral ischemia, inferoapical infarct w peri infarct ischemia; EF 41; High Risk   Carotid stenosis    a. Carotid US (02/27/14):  Bilateral ICA 1-39%   CKD (chronic kidney disease) stage 3, GFR 30-59 ml/min ([place]) 07/21/2021   Depression    Diabetes mellitus    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[place])    a. Mesenteric Artery  Duplex (7/15):  pSMA chronic dissection with aneurysmal dilation of 1.29 cm ([facility]);  b.  Chest CTA (02/26/14):  IMPRESSION:  1. No aortic  dissection or other acute abnormality.  2. 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  3. Stable 17 mm ectasia of the left common iliac artery.  4. Atherosclerosis, including aortoiliac and coronary artery disease.    HFmrEF (heart failure with mildly reduced EF) 09/2015   Echo 1/17: mod LVH, EF 55%, inf-lat HK, Gr 1 DD, mild LAE // Echocardiogram 10/22: EF 40-45, Gr 1 DD, mildly reduced RVSF, RVSP 26.6, trivial MR, trivial AI, post and ant-lat HK   Hx of cardiovascular stress test    Lexiscan Myoview (2/16):  Inferior lateral defect c/w thinning vs small prior infarct, no ischemia, EF 49%, Low Risk   Hx of echocardiogram    a.  Echocardiogram (02/27/14):  Mod focal basal and mild concentric LVH, EF 50-55%, no RWMA, Gr 1 DD, mild TR, normal RVF   Hyperlipidemia    Hypertension    Iliac artery aneurysm, left (HCC)    Myocardial infarction [facility])    Stroke ([place]) 09/26/2009    Past Surgical History:  Procedure Laterality Date   CARDIAC CATHETERIZATION     CARDIAC CATHETERIZATION N/A 06/25/2015   Procedure: Left Heart Cath and Coronary Angiography;  Surgeon: Annita Brod  Angelena Form, MD;  Location: [place] [facility];  Service: Cardiovascular;  Laterality: N/A;   COLONOSCOPY WITH PROPOFOL N/A 07/23/2015   Procedure: COLONOSCOPY WITH PROPOFOL;  Surgeon: [name], MD;  Location: [facility];  Service: Endoscopy;  Laterality: N/A;   CORONARY ANGIOPLASTY     LEFT HEART CATH AND CORONARY ANGIOGRAPHY N/A 08/31/2021   Procedure: LEFT HEART CATH AND CORONARY ANGIOGRAPHY;  Surgeon: Belva Crome, MD;  Location: [place] CV LAB;  Service: Cardiovascular;  Laterality: N/A;   LEFT HEART CATHETERIZATION WITH CORONARY ANGIOGRAM N/A 02/26/2014   Procedure: LEFT HEART CATHETERIZATION WITH CORONARY ANGIOGRAM;  Surgeon: Wellington Hampshire, MD;  Location: [facility];   Service: Cardiovascular;  Laterality: N/A;   LEFT HEART CATHETERIZATION WITH CORONARY ANGIOGRAM N/A 03/10/2014   Procedure: LEFT HEART CATHETERIZATION WITH CORONARY ANGIOGRAM;  Surgeon: Jettie Booze, MD;  Location: [facility] CATH LAB;  Service: Cardiovascular;  Laterality: N/A;    Social History: Support: Lives with his wife.  Presents today with his son.  He does not speak much [place].  Social History   Tobacco Use  Smoking Status Former   Packs/day: 0.25   Years: 38.00   Pack years: 9.50   Types: Cigarettes   Quit date: 2008   Years since quitting: 14.9  Smokeless Tobacco Never    Social History   Substance and Sexual Activity  Alcohol Use No   Alcohol/week: 0.0 standard drinks     Allergies  Allergen Reactions   Metformin And Related     Pt feels that this causes constipation and will not take       Current Outpatient Medications  Medication Sig Dispense Refill   aspirin EC 81 MG EC tablet Take 1 tablet (81 mg total) by mouth daily.     blood glucose meter kit and supplies Dispense based on patient and insurance preference. Use up to four times daily as directed. (FOR ICD-10 E10.9, E11.9). 1 each 0   gabapentin (NEURONTIN) 300 MG capsule Take 1 capsule (300 mg total) by mouth at bedtime. 90 capsule 3   glucose blood (ONETOUCH ULTRA) test strip USE 1 STRIP TO CHECK GLUCOSE TWICE DAILY . 100 each 3   insulin glargine (LANTUS) 100 UNIT/ML injection Inject 22-27 Units into the skin daily. 22-"normal blood sugar reading" 27-"elevated blood sugar readings"     Insulin Pen Needle (TECHLITE PEN NEEDLES) 32G X 8 MM MISC Use daily to administer insulin 100 each 3   isosorbide mononitrate (IMDUR) 30 MG 24 hr tablet Take 1 tablet (30 mg total) by mouth daily. 30 tablet 11   Lancets (ONETOUCH DELICA PLUS [id number]) MISC SMARTSIG:1 Topical Daily     lisinopril (ZESTRIL) 20 MG tablet Take 0.5 tablets (10 mg total) by mouth daily. 90 tablet 3   metoprolol succinate (TOPROL-XL) 50  MG 24 hr tablet Take 1 tablet (50 mg total) by mouth daily. Take with or immediately following a meal. 90 tablet 3   nitroGLYCERIN (NITROSTAT) 0.4 MG SL tablet Place 1 tablet (0.4 mg total) under the tongue every 5 (five) minutes as needed for chest pain. 25 tablet 2   rosuvastatin (CRESTOR) 40 MG tablet Take 1 tablet by mouth once daily 90 tablet 3   No current facility-administered medications for this visit.    (Not in a hospital admission)   Family History  Problem Relation Age of Onset   Diabetes Father    Hypertension Father    Heart attack Father    Stroke Neg Hx  Review of Systems:   Review of Systems  Constitutional:  Positive for malaise/fatigue.  Respiratory:  Positive for shortness of breath.   Cardiovascular:  Negative for chest pain.     Physical Exam: BP 115/66 (BP Location: Right Arm, Patient Position: Sitting)   Pulse 65   Resp 20   Ht $R'5\' 6"'Mg$  (1.676 m)   Wt 155 lb (70.3 kg)   SpO2 97% Comment: RA  BMI 25.02 kg/m  Physical Exam Constitutional:      General: He is not in acute distress.    Appearance: Normal appearance. He is normal weight. He is not ill-appearing.  Eyes:     Extraocular Movements: Extraocular movements intact.  Cardiovascular:     Rate and Rhythm: Normal rate.     Heart sounds: No murmur heard. Pulmonary:     Effort: Pulmonary effort is normal. No respiratory distress.     Breath sounds: Normal breath sounds.  Musculoskeletal:     Cervical back: Normal range of motion.  Skin:    General: Skin is warm and dry.  Neurological:     General: No focal deficit present.     Mental Status: He is alert and oriented to person, place, and time.      Diagnostic Studies & Laboratory data:    Left Heart Catherization:  Echo:  IMPRESSIONS     1. Frequent PVCs during exam.   2. Left ventricular ejection fraction, by estimation, is 40 to 45% with  beat to beat variability. The left ventricle has mildly decreased  function. The  left ventricle demonstrates regional wall motion  abnormalities (see scoring diagram/findings for  description). There is mild left ventricular hypertrophy. Left ventricular  diastolic parameters are consistent with Grade I diastolic dysfunction  (impaired relaxation).   3. Right ventricular systolic function is mildly reduced. The right  ventricular size is normal. There is normal pulmonary artery systolic  pressure. The estimated right ventricular systolic pressure is 53.6 mmHg.   4. The mitral valve is grossly normal. Trivial mitral valve  regurgitation. No evidence of mitral stenosis.   5. The aortic valve is grossly normal. There is mild calcification of the  aortic valve. Aortic valve regurgitation is trivial. No aortic stenosis is  present.   6. The inferior vena cava is normal in size with greater than 50%  respiratory variability, suggesting right atrial pressure of 3 mmHg.   EKG: Sinus I have independently reviewed the above radiologic studies and discussed with the patient   Recent Lab Findings: Lab Results  Component Value Date   WBC 8.8 08/25/2021   HGB 14.5 08/25/2021   HCT 43.8 08/25/2021   PLT 158 08/25/2021   GLUCOSE 61 (L) 08/25/2021   CHOL 179 06/07/2021   TRIG 273.0 (H) 06/07/2021   HDL 36.00 (L) 06/07/2021   LDLDIRECT 124.0 06/07/2021   LDLCALC 35 05/11/2020   ALT 21 06/07/2021   AST 19 06/07/2021   NA 140 08/25/2021   K 3.6 08/25/2021   CL 99 08/25/2021   CREATININE 1.35 (H) 08/25/2021   BUN 27 08/25/2021   CO2 31 (H) 08/25/2021   TSH 1.34 06/07/2017   INR 1.0 12/19/2019   HGBA1C 8.7 (H) 06/07/2021      Assessment / Plan:   70 year old male with severe three-vessel coronary artery disease.  I personally reviewed his left heart cath and his echocardiogram.  In regards to his echo his left heart function is mildly reduced with an EF of 40 to 45%.  His  right heart function is also reduced slightly.  He has no significant valvular disease.  In  regards to his left heart cath, he has good distal targets on all walls.  He does have a tight stenosis but the PDA appears a good target.  There is in-stent restenosis in his circumflex stents but there is a distal target and fills from left to left collaterals.  His LAD also has a tight proximal stenosis but has a good target distally.  The same is true for the diagonal.  Plan for CABG x4.     I  spent 40 minutes counseling the patient face to face.   Lajuana Matte 09/03/2021 1:05 PM

## 2021-09-01 NOTE — H&P (View-Only) (Signed)
[address]       [place] [address]             [phone]        [place] [facility] [place] Medical Record #[id number] Date of Birth: 1950-12-27  Referring: Belva Crome, MD Primary Care: Lorelei Pont Gay Filler, MD Primary Cardiologist:Michael Burt Knack, MD  Chief Complaint:    Chief Complaint  Patient presents with   Coronary Artery Disease    Surgical consult, Cardiac Cath 08/31/21, ECHO 07/21/21    History of Present Illness:     70 year old male referred by Dr. Tamala Julian for surgical evaluation of three-vessel coronary artery disease.  He does have a history of PCI in 2015 with stent placement to the RCA and circumflex system.  Most recent left heart cath in 2016 showed that the stents were patent.  He has been having some anginal symptoms and has come to the emergency department for evaluation of this.  He recently underwent another left heart cath on 08/31/2021 which showed LAD, diagonal, and distal RCA disease.  He has an in-stent restenosis of his circumflex stent.  He denies any chest pain or shortness of breath but admits to fatigability.   Past Medical and Surgical History: Previous Chest Surgery: No Previous Chest Radiation: No Diabetes Mellitus: Yes.  HbA1C 8.7 Creatinine: 1.35  Past Medical History:  Diagnosis Date   CAD (coronary artery disease)    a. LHC (02/26/14):  mLAD 20 (faint L>R collats), mCFX 50, pRCA 95 (plaque rupture) >> PCI:  Xience DES to pRCA (severe tortuosity of R innominate artery >> needs L radial or FA in future);  b. LHC (03/10/14):  CFX 90, oOM 70, pRCA stent patent >> PCI:  Xience DES to [facility] // Myoview 10/22: anterolateral ischemia, inferoapical infarct w peri infarct ischemia; EF 41; High Risk   Carotid stenosis    a. Carotid US (02/27/14):  Bilateral ICA 1-39%   CKD (chronic kidney disease) stage 3, GFR 30-59 ml/min ([facility]) 07/21/2021   Depression    Diabetes mellitus    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[place])    a. Mesenteric Artery  Duplex (7/15):  pSMA chronic dissection with aneurysmal dilation of 1.29 cm ([facility]);  b.  Chest CTA (02/26/14):  IMPRESSION:  1. No aortic  dissection or other acute abnormality.  2. 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  3. Stable 17 mm ectasia of the left common iliac artery.  4. Atherosclerosis, including aortoiliac and coronary artery disease.    HFmrEF (heart failure with mildly reduced EF) 09/2015   Echo 1/17: mod LVH, EF 55%, inf-lat HK, Gr 1 DD, mild LAE // Echocardiogram 10/22: EF 40-45, Gr 1 DD, mildly reduced RVSF, RVSP 26.6, trivial MR, trivial AI, post and ant-lat HK   Hx of cardiovascular stress test    Lexiscan Myoview (2/16):  Inferior lateral defect c/w thinning vs small prior infarct, no ischemia, EF 49%, Low Risk   Hx of echocardiogram    a.  Echocardiogram (02/27/14):  Mod focal basal and mild concentric LVH, EF 50-55%, no RWMA, Gr 1 DD, mild TR, normal RVF   Hyperlipidemia    Hypertension    Iliac artery aneurysm, left (HCC)    Myocardial infarction [facility])    Stroke ([place]) 09/26/2009    Past Surgical History:  Procedure Laterality Date   CARDIAC CATHETERIZATION     CARDIAC CATHETERIZATION N/A 06/25/2015   Procedure: Left Heart Cath and Coronary Angiography;  Surgeon: Annita Brod  Angelena Form, MD;  Location: [place] [facility] LAB;  Service: Cardiovascular;  Laterality: N/A;   COLONOSCOPY WITH PROPOFOL N/A 07/23/2015   Procedure: COLONOSCOPY WITH PROPOFOL;  Surgeon: [name], MD;  Location: [facility];  Service: Endoscopy;  Laterality: N/A;   CORONARY ANGIOPLASTY     LEFT HEART CATH AND CORONARY ANGIOGRAPHY N/A 08/31/2021   Procedure: LEFT HEART CATH AND CORONARY ANGIOGRAPHY;  Surgeon: Belva Crome, MD;  Location: [facility] CV LAB;  Service: Cardiovascular;  Laterality: N/A;   LEFT HEART CATHETERIZATION WITH CORONARY ANGIOGRAM N/A 02/26/2014   Procedure: LEFT HEART CATHETERIZATION WITH CORONARY ANGIOGRAM;  Surgeon: Wellington Hampshire, MD;  Location: [facility]ATH [facility];   Service: Cardiovascular;  Laterality: N/A;   LEFT HEART CATHETERIZATION WITH CORONARY ANGIOGRAM N/A 03/10/2014   Procedure: LEFT HEART CATHETERIZATION WITH CORONARY ANGIOGRAM;  Surgeon: Jettie Booze, MD;  Location: [facility] CATH LAB;  Service: Cardiovascular;  Laterality: N/A;    Social History: Support: Lives with his wife.  Presents today with his son.  He does not speak much [place].  Social History   Tobacco Use  Smoking Status Former   Packs/day: 0.25   Years: 38.00   Pack years: 9.50   Types: Cigarettes   Quit date: 2008   Years since quitting: 14.9  Smokeless Tobacco Never    Social History   Substance and Sexual Activity  Alcohol Use No   Alcohol/week: 0.0 standard drinks     Allergies  Allergen Reactions   Metformin And Related     Pt feels that this causes constipation and will not take       Current Outpatient Medications  Medication Sig Dispense Refill   aspirin EC 81 MG EC tablet Take 1 tablet (81 mg total) by mouth daily.     blood glucose meter kit and supplies Dispense based on patient and insurance preference. Use up to four times daily as directed. (FOR ICD-10 E10.9, E11.9). 1 each 0   gabapentin (NEURONTIN) 300 MG capsule Take 1 capsule (300 mg total) by mouth at bedtime. 90 capsule 3   glucose blood (ONETOUCH ULTRA) test strip USE 1 STRIP TO CHECK GLUCOSE TWICE DAILY . 100 each 3   insulin glargine (LANTUS) 100 UNIT/ML injection Inject 22-27 Units into the skin daily. 22-"normal blood sugar reading" 27-"elevated blood sugar readings"     Insulin Pen Needle (TECHLITE PEN NEEDLES) 32G X 8 MM MISC Use daily to administer insulin 100 each 3   isosorbide mononitrate (IMDUR) 30 MG 24 hr tablet Take 1 tablet (30 mg total) by mouth daily. 30 tablet 11   Lancets (ONETOUCH DELICA PLUS [id number]) MISC SMARTSIG:1 Topical Daily     lisinopril (ZESTRIL) 20 MG tablet Take 0.5 tablets (10 mg total) by mouth daily. 90 tablet 3   metoprolol succinate (TOPROL-XL) 50  MG 24 hr tablet Take 1 tablet (50 mg total) by mouth daily. Take with or immediately following a meal. 90 tablet 3   nitroGLYCERIN (NITROSTAT) 0.4 MG SL tablet Place 1 tablet (0.4 mg total) under the tongue every 5 (five) minutes as needed for chest pain. 25 tablet 2   rosuvastatin (CRESTOR) 40 MG tablet Take 1 tablet by mouth once daily 90 tablet 3   No current facility-administered medications for this visit.    (Not in a hospital admission)   Family History  Problem Relation Age of Onset   Diabetes Father    Hypertension Father    Heart attack Father    Stroke Neg Hx  Review of Systems:   Review of Systems  Constitutional:  Positive for malaise/fatigue.  Respiratory:  Positive for shortness of breath.   Cardiovascular:  Negative for chest pain.     Physical Exam: BP 115/66 (BP Location: Right Arm, Patient Position: Sitting)   Pulse 65   Resp 20   Ht $R'5\' 6"'HT$  (1.676 m)   Wt 155 lb (70.3 kg)   SpO2 97% Comment: RA  BMI 25.02 kg/m  Physical Exam Constitutional:      General: He is not in acute distress.    Appearance: Normal appearance. He is normal weight. He is not ill-appearing.  Eyes:     Extraocular Movements: Extraocular movements intact.  Cardiovascular:     Rate and Rhythm: Normal rate.     Heart sounds: No murmur heard. Pulmonary:     Effort: Pulmonary effort is normal. No respiratory distress.     Breath sounds: Normal breath sounds.  Musculoskeletal:     Cervical back: Normal range of motion.  Skin:    General: Skin is warm and dry.  Neurological:     General: No focal deficit present.     Mental Status: He is alert and oriented to person, place, and time.      Diagnostic Studies & Laboratory data:    Left Heart Catherization:  Echo:  IMPRESSIONS     1. Frequent PVCs during exam.   2. Left ventricular ejection fraction, by estimation, is 40 to 45% with  beat to beat variability. The left ventricle has mildly decreased  function. The  left ventricle demonstrates regional wall motion  abnormalities (see scoring diagram/findings for  description). There is mild left ventricular hypertrophy. Left ventricular  diastolic parameters are consistent with Grade I diastolic dysfunction  (impaired relaxation).   3. Right ventricular systolic function is mildly reduced. The right  ventricular size is normal. There is normal pulmonary artery systolic  pressure. The estimated right ventricular systolic pressure is 66.2 mmHg.   4. The mitral valve is grossly normal. Trivial mitral valve  regurgitation. No evidence of mitral stenosis.   5. The aortic valve is grossly normal. There is mild calcification of the  aortic valve. Aortic valve regurgitation is trivial. No aortic stenosis is  present.   6. The inferior vena cava is normal in size with greater than 50%  respiratory variability, suggesting right atrial pressure of 3 mmHg.   EKG: Sinus I have independently reviewed the above radiologic studies and discussed with the patient   Recent Lab Findings: Lab Results  Component Value Date   WBC 8.8 08/25/2021   HGB 14.5 08/25/2021   HCT 43.8 08/25/2021   PLT 158 08/25/2021   GLUCOSE 61 (L) 08/25/2021   CHOL 179 06/07/2021   TRIG 273.0 (H) 06/07/2021   HDL 36.00 (L) 06/07/2021   LDLDIRECT 124.0 06/07/2021   LDLCALC 35 05/11/2020   ALT 21 06/07/2021   AST 19 06/07/2021   NA 140 08/25/2021   K 3.6 08/25/2021   CL 99 08/25/2021   CREATININE 1.35 (H) 08/25/2021   BUN 27 08/25/2021   CO2 31 (H) 08/25/2021   TSH 1.34 06/07/2017   INR 1.0 12/19/2019   HGBA1C 8.7 (H) 06/07/2021      Assessment / Plan:   70 year old male with severe three-vessel coronary artery disease.  I personally reviewed his left heart cath and his echocardiogram.  In regards to his echo his left heart function is mildly reduced with an EF of 40 to 45%.  His  right heart function is also reduced slightly.  He has no significant valvular disease.  In  regards to his left heart cath, he has good distal targets on all walls.  He does have a tight stenosis but the PDA appears a good target.  There is in-stent restenosis in his circumflex stents but there is a distal target and fills from left to left collaterals.  His LAD also has a tight proximal stenosis but has a good target distally.  The same is true for the diagonal.  Plan for CABG x4.     I  spent 40 minutes counseling the patient face to face.   Lajuana Matte 09/03/2021 1:05 PM

## 2021-09-01 NOTE — H&P (View-Only) (Signed)
[address]       [place] [address]             [phone]        [facility] Medical Record #[id number] Date of Birth: Feb 14, 1951  Referring: Belva Crome, MD Primary Care: Lorelei Pont Gay Filler, MD Primary Cardiologist:Michael Burt Knack, MD  Chief Complaint:    Chief Complaint  Patient presents with   Coronary Artery Disease    Surgical consult, Cardiac Cath 08/31/21, ECHO 07/21/21    History of Present Illness:     70 year old male referred by Dr. Tamala Julian for surgical evaluation of three-vessel coronary artery disease.  He does have a history of PCI in 2015 with stent placement to the RCA and circumflex system.  Most recent left heart cath in 2016 showed that the stents were patent.  He has been having some anginal symptoms and has come to the emergency department for evaluation of this.  He recently underwent another left heart cath on 08/31/2021 which showed LAD, diagonal, and distal RCA disease.  He has an in-stent restenosis of his circumflex stent.  He denies any chest pain or shortness of breath but admits to fatigability.   Past Medical and Surgical History: Previous Chest Surgery: No Previous Chest Radiation: No Diabetes Mellitus: Yes.  HbA1C 8.7 Creatinine: 1.35  Past Medical History:  Diagnosis Date   CAD (coronary artery disease)    a. LHC (02/26/14):  mLAD 20 (faint L>R collats), mCFX 50, pRCA 95 (plaque rupture) >> PCI:  Xience DES to pRCA (severe tortuosity of R innominate artery >> needs L radial or FA in future);  b. LHC (03/10/14):  CFX 90, oOM 70, pRCA stent patent >> PCI:  Xience DES to [facility] // Myoview 10/22: anterolateral ischemia, inferoapical infarct w peri infarct ischemia; EF 41; High Risk   Carotid stenosis    a. Carotid US (02/27/14):  Bilateral ICA 1-39%   CKD (chronic kidney disease) stage 3, GFR 30-59 ml/min ([place]) 07/21/2021   Depression    Diabetes mellitus    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[place])    a. Mesenteric Artery  Duplex (7/15):  pSMA chronic dissection with aneurysmal dilation of 1.29 cm (VVS);  b.  Chest CTA (02/26/14):  IMPRESSION:  1. No aortic  dissection or other acute abnormality.  2. 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  3. Stable 17 mm ectasia of the left common iliac artery.  4. Atherosclerosis, including aortoiliac and coronary artery disease.    HFmrEF (heart failure with mildly reduced EF) 09/2015   Echo 1/17: mod LVH, EF 55%, inf-lat HK, Gr 1 DD, mild LAE // Echocardiogram 10/22: EF 40-45, Gr 1 DD, mildly reduced RVSF, RVSP 26.6, trivial MR, trivial AI, post and ant-lat HK   Hx of cardiovascular stress test    Lexiscan Myoview (2/16):  Inferior lateral defect c/w thinning vs small prior infarct, no ischemia, EF 49%, Low Risk   Hx of echocardiogram    a.  Echocardiogram (02/27/14):  Mod focal basal and mild concentric LVH, EF 50-55%, no RWMA, Gr 1 DD, mild TR, normal RVF   Hyperlipidemia    Hypertension    Iliac artery aneurysm, left (HCC)    Myocardial infarction [facility])    Stroke ([place]) 09/26/2009    Past Surgical History:  Procedure Laterality Date   CARDIAC CATHETERIZATION     CARDIAC CATHETERIZATION N/A 06/25/2015   Procedure: Left Heart Cath and Coronary Angiography;  Surgeon: Annita Brod  Angelena Form, MD;  Location: [facility] CV LAB;  Service: Cardiovascular;  Laterality: N/A;   COLONOSCOPY WITH PROPOFOL N/A 07/23/2015   Procedure: COLONOSCOPY WITH PROPOFOL;  Surgeon: [name], MD;  Location: [facility] ENDOSCOPY;  Service: Endoscopy;  Laterality: N/A;   CORONARY ANGIOPLASTY     LEFT HEART CATH AND CORONARY ANGIOGRAPHY N/A 08/31/2021   Procedure: LEFT HEART CATH AND CORONARY ANGIOGRAPHY;  Surgeon: Belva Crome, MD;  Location: [facility] CV LAB;  Service: Cardiovascular;  Laterality: N/A;   LEFT HEART CATHETERIZATION WITH CORONARY ANGIOGRAM N/A 02/26/2014   Procedure: LEFT HEART CATHETERIZATION WITH CORONARY ANGIOGRAM;  Surgeon: Wellington Hampshire, MD;  Location: [facility] CATH LAB;   Service: Cardiovascular;  Laterality: N/A;   LEFT HEART CATHETERIZATION WITH CORONARY ANGIOGRAM N/A 03/10/2014   Procedure: LEFT HEART CATHETERIZATION WITH CORONARY ANGIOGRAM;  Surgeon: Jettie Booze, MD;  Location: [facility] CATH LAB;  Service: Cardiovascular;  Laterality: N/A;    Social History: Support: Lives with his wife.  Presents today with his son.  He does not speak much [place].  Social History   Tobacco Use  Smoking Status Former   Packs/day: 0.25   Years: 38.00   Pack years: 9.50   Types: Cigarettes   Quit date: 2008   Years since quitting: 14.9  Smokeless Tobacco Never    Social History   Substance and Sexual Activity  Alcohol Use No   Alcohol/week: 0.0 standard drinks     Allergies  Allergen Reactions   Metformin And Related     Pt feels that this causes constipation and will not take       Current Outpatient Medications  Medication Sig Dispense Refill   aspirin EC 81 MG EC tablet Take 1 tablet (81 mg total) by mouth daily.     blood glucose meter kit and supplies Dispense based on patient and insurance preference. Use up to four times daily as directed. (FOR ICD-10 E10.9, E11.9). 1 each 0   gabapentin (NEURONTIN) 300 MG capsule Take 1 capsule (300 mg total) by mouth at bedtime. 90 capsule 3   glucose blood (ONETOUCH ULTRA) test strip USE 1 STRIP TO CHECK GLUCOSE TWICE DAILY . 100 each 3   insulin glargine (LANTUS) 100 UNIT/ML injection Inject 22-27 Units into the skin daily. 22-"normal blood sugar reading" 27-"elevated blood sugar readings"     Insulin Pen Needle (TECHLITE PEN NEEDLES) 32G X 8 MM MISC Use daily to administer insulin 100 each 3   isosorbide mononitrate (IMDUR) 30 MG 24 hr tablet Take 1 tablet (30 mg total) by mouth daily. 30 tablet 11   Lancets (ONETOUCH DELICA PLUS [id number]) MISC SMARTSIG:1 Topical Daily     lisinopril (ZESTRIL) 20 MG tablet Take 0.5 tablets (10 mg total) by mouth daily. 90 tablet 3   metoprolol succinate (TOPROL-XL) 50  MG 24 hr tablet Take 1 tablet (50 mg total) by mouth daily. Take with or immediately following a meal. 90 tablet 3   nitroGLYCERIN (NITROSTAT) 0.4 MG SL tablet Place 1 tablet (0.4 mg total) under the tongue every 5 (five) minutes as needed for chest pain. 25 tablet 2   rosuvastatin (CRESTOR) 40 MG tablet Take 1 tablet by mouth once daily 90 tablet 3   No current facility-administered medications for this visit.    (Not in a hospital admission)   Family History  Problem Relation Age of Onset   Diabetes Father    Hypertension Father    Heart attack Father    Stroke Neg Hx  Review of Systems:   Review of Systems  Constitutional:  Positive for malaise/fatigue.  Respiratory:  Positive for shortness of breath.   Cardiovascular:  Negative for chest pain.     Physical Exam: BP 115/66 (BP Location: Right Arm, Patient Position: Sitting)    Pulse 65    Resp 20    Ht $R'5\' 6"'ct$  (1.676 m)    Wt 155 lb (70.3 kg)    SpO2 97% Comment: RA   BMI 25.02 kg/m  Physical Exam Constitutional:      General: He is not in acute distress.    Appearance: Normal appearance. He is normal weight. He is not ill-appearing.  Eyes:     Extraocular Movements: Extraocular movements intact.  Cardiovascular:     Rate and Rhythm: Normal rate.     Heart sounds: No murmur heard. Pulmonary:     Effort: Pulmonary effort is normal. No respiratory distress.     Breath sounds: Normal breath sounds.  Musculoskeletal:     Cervical back: Normal range of motion.  Skin:    General: Skin is warm and dry.  Neurological:     General: No focal deficit present.     Mental Status: He is alert and oriented to person, place, and time.      Diagnostic Studies & Laboratory data:    Left Heart Catherization:  Echo:  IMPRESSIONS     1. Frequent PVCs during exam.   2. Left ventricular ejection fraction, by estimation, is 40 to 45% with  beat to beat variability. The left ventricle has mildly decreased  function. The  left ventricle demonstrates regional wall motion  abnormalities (see scoring diagram/findings for  description). There is mild left ventricular hypertrophy. Left ventricular  diastolic parameters are consistent with Grade I diastolic dysfunction  (impaired relaxation).   3. Right ventricular systolic function is mildly reduced. The right  ventricular size is normal. There is normal pulmonary artery systolic  pressure. The estimated right ventricular systolic pressure is 83.3 mmHg.   4. The mitral valve is grossly normal. Trivial mitral valve  regurgitation. No evidence of mitral stenosis.   5. The aortic valve is grossly normal. There is mild calcification of the  aortic valve. Aortic valve regurgitation is trivial. No aortic stenosis is  present.   6. The inferior vena cava is normal in size with greater than 50%  respiratory variability, suggesting right atrial pressure of 3 mmHg.   EKG: Sinus I have independently reviewed the above radiologic studies and discussed with the patient   Recent Lab Findings: Lab Results  Component Value Date   WBC 8.8 08/25/2021   HGB 14.5 08/25/2021   HCT 43.8 08/25/2021   PLT 158 08/25/2021   GLUCOSE 61 (L) 08/25/2021   CHOL 179 06/07/2021   TRIG 273.0 (H) 06/07/2021   HDL 36.00 (L) 06/07/2021   LDLDIRECT 124.0 06/07/2021   LDLCALC 35 05/11/2020   ALT 21 06/07/2021   AST 19 06/07/2021   NA 140 08/25/2021   K 3.6 08/25/2021   CL 99 08/25/2021   CREATININE 1.35 (H) 08/25/2021   BUN 27 08/25/2021   CO2 31 (H) 08/25/2021   TSH 1.34 06/07/2017   INR 1.0 12/19/2019   HGBA1C 8.7 (H) 06/07/2021      Assessment / Plan:   70 year old male with severe three-vessel coronary artery disease.  I personally reviewed his left heart cath and his echocardiogram.  In regards to his echo his left heart function is mildly reduced with an EF  of 40 to 45%.  His right heart function is also reduced slightly.  He has no significant valvular disease.  In  regards to his left heart cath, he has good distal targets on all walls.  He does have a tight stenosis but the PDA appears a good target.  There is in-stent restenosis in his circumflex stents but there is a distal target and fills from left to left collaterals.  His LAD also has a tight proximal stenosis but has a good target distally.  The same is true for the diagonal.  Plan for CABG x4.     I  spent 40 minutes counseling the patient face to face.   Lajuana Matte 09/03/2021 1:05 PM

## 2021-09-03 ENCOUNTER — Institutional Professional Consult (permissible substitution): Payer: Medicare Other | Admitting: Thoracic Surgery (Cardiothoracic Vascular Surgery)

## 2021-09-03 ENCOUNTER — Other Ambulatory Visit: Payer: Self-pay

## 2021-09-03 ENCOUNTER — Other Ambulatory Visit: Payer: Self-pay | Admitting: *Deleted

## 2021-09-03 VITALS — BP 115/66 | HR 65 | Resp 20 | Ht 66.0 in | Wt 155.0 lb

## 2021-09-03 DIAGNOSIS — I2510 Atherosclerotic heart disease of native coronary artery without angina pectoris: Secondary | ICD-10-CM | POA: Diagnosis not present

## 2021-09-04 ENCOUNTER — Encounter (HOSPITAL_COMMUNITY): Payer: Medicare Other

## 2021-09-05 ENCOUNTER — Encounter: Payer: Self-pay | Admitting: Physician Assistant

## 2021-09-05 NOTE — Progress Notes (Signed)
Cardiology Office Note:    Date:  09/07/2021   ID:  Barry [name], DOB 1951-08-01, MRN [id number]  PCP:  Darreld Mclean, MD   [facility] Providers Cardiologist:  Sherren Mocha, MD Cardiology APP:  Sharmon Revere     Referring MD: Darreld Mclean, MD   Chief Complaint:  Hospitalization Follow-up (S/p cardiac cath)    Patient Profile:   Barry [name] is a 70 y.o. male with:  Coronary artery disease S/p NSTEMI 02/2014>> s/p DES to the proximal RCA Re-Admx 02/2014 w/ [place]>> s/p DES to the LCx Myoview 2/16: Low risk, inferior thinning versus infarct, no ischemia Cath 9/16: LCx, RCA stents ok; OM2 subbranch jailed, oPDA moderate stenosis>> med Rx Myoview 10/22: Anterolateral ischemia, EF 41, high risk HFmrEF (heart failure with mildly reduced ejection fraction)  Echo 10/22: EF 40-45 PVCs Monitor 10/22: PVC burden 20% Chronic superior mesenteric artery dissection Followed by Dr. Trula Slade Peripheral Arterial Disease   L CIA w penetrating ulcer History of stroke in 2011 Diabetes mellitus Hypertension Chronic kidney disease Hyperlipidemia Rosuvastatin; Alirocumab Abdominal aortic aneurysm   History of Present Illness: Barry [name] was last seen 08/25/21.  He had been having NTG responsive chest pain and a Myoview was high risk with ant-lat ischeima and inf-apical scar with peri-infarct ischemia.  An echocardiogram showed reduced EF at 40-45%.  An event monitor showed a high PVC burden (20%).  I set him up for a cardiac catheterization which was performed on 08/31/21 by Dr. Tamala Julian and demonstrated severe mid LAD and D1 disease, total occlusion of the LCx stent with L-L collaterals, and diffuse mod disease in the RCA with a patent proximal stent.  CABG was recommended for management of his coronary artery disease.  He was seen by Dr. Kipp Brood.  His CABG is planned for 09/21/21.  He returns for f/u.  He is here with his wife and is seen with the help of an interpreter (audio only;  video not available).  He notes his chest pain and shortness of breath are worse overall.  He continues to take NTG for his symptoms.  He feels weak and dizzy but has not had syncope.    ASSESSMENT & PLAN:   Coronary artery disease S/p MI in 2015 tx with DES to [facility] and subsequent DES to LCx in 2015.  Cardiac catheterization was done last week for ongoing angina and a high risk Myoview.  EF is 40-45 by echocardiogram.  The cath demonstrated severe mid LAD and first diagonal stenosis, total occlusion of the LCx stent and diffuse moderate disease in the RCA with a patent proximal stent.  He has been referred to Dr. Kipp Brood and CABG is planned 09/21/2021.  He continues to have anginal symptoms.  EKG today demonstrates no significant changes.  I have recommended continued medical therapy and proceeding with bypass surgery on the 27th as planned.  I reviewed his case today with Dr. Gasper Sells (attending MD) who agreed.  Increase isosorbide mononitrate to 60 mg daily.  Continue metoprolol succinate 50 mg daily, aspirin 81 mg daily, rosuvastatin 40 mg daily.  He knows to go the emergency room if his symptoms should worsen.  PVC's (premature ventricular contractions) Continue beta-blocker.  Reassess PVC burden post CABG.  HFmrEF (heart failure with mildly reduced EF) EF 40-45.  NYHA IIb-III.  Volume status currently stable.  Continue lisinopril 10 mg daily, metoprolol succinate 50 mg daily.  Increase isosorbide to 60 mg daily.  Continue to titrate GDMT post bypass.  Dyslipidemia,  goal LDL below 70 Continue rosuvastatin 40 mg daily.    Hypertension Blood pressure is well controlled on metoprolol succinate, isosorbide mononitrate, lisinopril.        Dispo:  Return for [facility] Follow up after CABG.    Prior CV studies: Cardiac catheterization September 23, 2021 LAD ost 45, prox 80, dist 70, 70; D1 95 LCx mid stent 100 RCA ost stent patent w 40 ISR, mid 38; RPAV 60; RPL3 75 EF 35-45   Myoview  07/15/21 EF 41, ant-lat ischemia, inf-apical infarct with peri-infarct ischemia; high risk   Cardiac monitor 07/12/21 NSR avg HR 71; no AF/Flutter; no high grade HB or pathologic pauses PVC burden 20%, rare supraventricular beats w/o sustained arrhythmia   Echocardiogram 07/12/21 EF 40-45, mild LVH, Gr 1 DD, mildly reudced RVSF, RVSP 26.6, trivial MR, trivial AI, post and ant-lat HK    Past Medical History:  Diagnosis Date   CAD (coronary artery disease)    LHC (02/26/14):  >> PCI:  Xience DES to pRCA // LHC (03/10/14):  >> PCI:  Xience DES to [facility] // Myoview 2/16: low risk // Cath 2016: patent stents; stable dz - med Rx // Myoview 10/22: anterolateral ischemia, inferoapical infarct w peri infarct ischemia; EF 41; High Risk // Cath 12/22: LCx stent occl, severe LAD and Dx dz, diffuse RCA disease >> CABG   Carotid stenosis    a. Carotid US (02/27/14):  Bilateral ICA 1-39%   CKD (chronic kidney disease) stage 3    Depression    Diabetes mellitus    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[place])    a. Mesenteric Artery Duplex (7/15):  pSMA chronic dissection with aneurysmal dilation of 1.29 cm (VVS);  b.  Chest CTA (02/26/14):  IMPRESSION:  1. No aortic  dissection or other acute abnormality.  2. 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  3. Stable 17 mm ectasia of the left common iliac artery.  4. Atherosclerosis, including aortoiliac and coronary artery disease.    HFmrEF (heart failure with mildly reduced EF) 09/2015   Echo 6/15: EF 50-55 // Echo 1/17: mod LVH, EF 55%, inf-lat HK, Gr 1 DD, mild LAE // Echocardiogram 10/22: EF 40-45, Gr 1 DD, mildly reduced RVSF, RVSP 26.6, trivial MR, trivial AI, post and ant-lat HK   Hyperlipidemia    Hypertension    Iliac artery aneurysm, left (HCC)    Myocardial infarction ([place])    PVC's (premature ventricular contractions) 07/21/2021   Monitor 10/22:  NSR avg HR 71; no AF/Flutter; no high grade HB or pathologic pauses PVC burden 20%, rare supraventricular  beats w/o sustained arrhythmia    Stroke ([place]) 09/26/2009   Current Medications: Current Meds  Medication Sig   aspirin EC 81 MG EC tablet Take 1 tablet (81 mg total) by mouth daily.   blood glucose meter kit and supplies Dispense based on patient and insurance preference. Use up to four times daily as directed. (FOR ICD-10 E10.9, E11.9).   glucose blood (ONETOUCH ULTRA) test strip USE 1 STRIP TO CHECK GLUCOSE TWICE DAILY .   insulin glargine (LANTUS) 100 UNIT/ML injection Inject 22-27 Units into the skin daily. 22-"normal blood sugar reading" 27-"elevated blood sugar readings"   Insulin Pen Needle (TECHLITE PEN NEEDLES) 32G X 8 MM MISC Use daily to administer insulin   Lancets (ONETOUCH DELICA PLUS [id number]) MISC SMARTSIG:1 Topical Daily   lisinopril (ZESTRIL) 20 MG tablet Take 0.5 tablets (10 mg total) by mouth daily.   metoprolol succinate (TOPROL-XL) 50 MG 24 hr  tablet Take 1 tablet (50 mg total) by mouth daily. Take with or immediately following a meal.   rosuvastatin (CRESTOR) 40 MG tablet Take 1 tablet by mouth once daily   [DISCONTINUED] isosorbide mononitrate (IMDUR) 30 MG 24 hr tablet Take 1 tablet (30 mg total) by mouth daily.    Allergies:   Metformin and related   Social History   Tobacco Use   Smoking status: Former    Packs/day: 0.25    Years: 38.00    Pack years: 9.50    Types: Cigarettes    Quit date: 2008    Years since quitting: 14.9   Smokeless tobacco: Never  Vaping Use   Vaping Use: Never used  Substance Use Topics   Alcohol use: No    Alcohol/week: 0.0 standard drinks   Drug use: No    Family Hx: The patient's family history includes Diabetes in his father; Heart attack in his father; Hypertension in his father. There is no history of Stroke.  ROS see HPI  EKGs/Labs/Other Test Reviewed:    EKG:  EKG is     today.  The ekg ordered today demonstrates sinus bradycardia, HR 58, normal axis, nonspecific ST-T wave changes, QTC 429, no change from prior  tracing  Recent Labs: 06/07/2021: ALT 21 07/09/2021: Magnesium 2.2 08/25/2021: BUN 27; Creatinine, Ser 1.35; Hemoglobin 14.5; Platelets 158; Potassium 3.6; Sodium 140   Recent Lipid Panel Lab Results  Component Value Date/Time   CHOL 179 06/07/2021 01:55 PM   CHOL 92 (L) 05/11/2020 10:20 AM   TRIG 273.0 (H) 06/07/2021 01:55 PM   HDL 36.00 (L) 06/07/2021 01:55 PM   HDL 34 (L) 05/11/2020 10:20 AM   LDLCALC 35 05/11/2020 10:20 AM   LDLDIRECT 124.0 06/07/2021 01:55 PM     Risk Assessment/Calculations:          Physical Exam:    VS:  BP 124/60   Pulse 62   Ht 5' 6"  (1.676 m)   Wt 166 lb 9.6 oz (75.6 kg)   SpO2 98%   BMI 26.89 kg/m     Wt Readings from Last 3 Encounters:  09/07/21 166 lb 9.6 oz (75.6 kg)  09/03/21 155 lb (70.3 kg)  08/31/21 155 lb (70.3 kg)    Constitutional:      Appearance: Healthy appearance. Not in distress.  Neck:     Vascular: No JVR. JVD normal.  Pulmonary:     Effort: Pulmonary effort is normal.     Breath sounds: No wheezing. No rales.  Cardiovascular:     Normal rate. Regular rhythm. Normal S1. Normal S2.      Murmurs: There is no murmur.     Comments: Right groin without hematoma or bruit Edema:    Peripheral edema absent.  Abdominal:     Palpations: Abdomen is soft.  Skin:    General: Skin is warm and dry.  Neurological:     General: No focal deficit present.     Mental Status: Alert and oriented to person, place and time.     Cranial Nerves: Cranial nerves are intact.      Medication Adjustments/Labs and Tests Ordered: Current medicines are reviewed at length with the patient today.  Concerns regarding medicines are outlined above.  Tests Ordered: Orders Placed This Encounter  Procedures   EKG 12-Lead   Medication Changes: Meds ordered this encounter  Medications   isosorbide mononitrate (IMDUR) 60 MG 24 hr tablet    Sig: Take 1 tablet (60 mg total)  by mouth daily.    Dispense:  30 tablet    Refill:  [address] [place], [place]  09/07/2021 9:05 AM    [facility] [place], [place], [place]  [address] Phone: [phone]; Fax: [phone]

## 2021-09-06 ENCOUNTER — Encounter: Payer: Self-pay | Admitting: *Deleted

## 2021-09-06 NOTE — Interval H&P Note (Signed)
Cath Lab Visit (complete for each Cath Lab visit)  Clinical Evaluation Leading to the Procedure:   ACS: No.  Non-ACS:    Anginal Classification: CCS [name]  Anti-ischemic medical therapy: Minimal Therapy (1 class of medications)  Non-Invasive Test Results: High-risk stress test findings: cardiac mortality >3%/year  Prior CABG: No previous CABG      History and Physical Interval Note:  09/06/2021 3:05 PM  Barry [name]  has presented today for surgery, with the diagnosis of angina - heart failure.  The various methods of treatment have been discussed with the patient and family. After consideration of risks, benefits and other options for treatment, the patient has consented to  Procedure(s): LEFT HEART CATH AND CORONARY ANGIOGRAPHY (N/A) as a surgical intervention.  The patient's history has been reviewed, patient examined, no change in status, stable for surgery.  I have reviewed the patient's chart and labs.  Questions were answered to the patient's satisfaction.     Barry [name]

## 2021-09-06 NOTE — Interval H&P Note (Signed)
History and Physical Interval Note:  09/06/2021 3:06 PM  Barry [name]  has presented today for surgery, with the diagnosis of angina - heart failure.  The various methods of treatment have been discussed with the patient and family. After consideration of risks, benefits and other options for treatment, the patient has consented to  Procedure(s): LEFT HEART CATH AND CORONARY ANGIOGRAPHY (N/A) as a surgical intervention.  The patient's history has been reviewed, patient examined, no change in status, stable for surgery.  I have reviewed the patient's chart and labs.  Questions were answered to the patient's satisfaction.     [name]

## 2021-09-07 ENCOUNTER — Ambulatory Visit: Payer: Medicare Other | Admitting: Physician Assistant

## 2021-09-07 ENCOUNTER — Encounter: Payer: Self-pay | Admitting: Physician Assistant

## 2021-09-07 VITALS — BP 124/60 | HR 62 | Ht 66.0 in | Wt 166.6 lb

## 2021-09-07 DIAGNOSIS — I5020 Unspecified systolic (congestive) heart failure: Secondary | ICD-10-CM

## 2021-09-07 DIAGNOSIS — E785 Hyperlipidemia, unspecified: Secondary | ICD-10-CM | POA: Diagnosis not present

## 2021-09-07 DIAGNOSIS — I25119 Atherosclerotic heart disease of native coronary artery with unspecified angina pectoris: Secondary | ICD-10-CM | POA: Diagnosis not present

## 2021-09-07 DIAGNOSIS — I10 Essential (primary) hypertension: Secondary | ICD-10-CM | POA: Diagnosis not present

## 2021-09-07 DIAGNOSIS — I493 Ventricular premature depolarization: Secondary | ICD-10-CM | POA: Diagnosis not present

## 2021-09-07 MED ORDER — ISOSORBIDE MONONITRATE ER 60 MG PO TB24: 60.0000 mg | ORAL_TABLET | Freq: Every day | ORAL | 11 refills | Status: DC

## 2021-09-07 NOTE — Assessment & Plan Note (Signed)
Blood pressure is well controlled on metoprolol succinate, isosorbide mononitrate, lisinopril.

## 2021-09-07 NOTE — Patient Instructions (Signed)
Medication Instructions:   INCREASE Imdur one (1) tablet by mouth ( 60 mg) daily.   *If you need a refill on your cardiac medications before your next appointment, please call your pharmacy*   Lab Work:  -NONE-  If you have labs (blood work) drawn today and your tests are completely normal, you will receive your results only by: [place] (if you have MyChart) OR A paper copy in the mail If you have any lab test that is abnormal or we need to change your treatment, we will call you to review the results.   Testing/Procedures:  -NONE   Follow-Up: At [facility], you and your health needs are our priority.  As part of our continuing mission to provide you with exceptional heart care, we have created designated Provider Care Teams.  These Care Teams include your primary Cardiologist (physician) and Advanced Practice Providers (APPs -  Physician Assistants and Nurse Practitioners) who all work together to provide you with the care you need, when you need it.  We recommend signing up for the patient portal called "MyChart".  Sign up information is provided on this After Visit Summary.  MyChart is used to connect with patients for Virtual Visits (Telemedicine).  Patients are able to view lab/test results, encounter notes, upcoming appointments, etc.  Non-urgent messages can be sent to your provider as well.   To learn more about what you can do with MyChart, go to [email or web address].    OTHER: If your chest pain gets worse you need to go to the emergency room.

## 2021-09-07 NOTE — Assessment & Plan Note (Signed)
EF 40-45.  NYHA IIb-III.  Volume status currently stable.  Continue lisinopril 10 mg daily, metoprolol succinate 50 mg daily.  Increase isosorbide to 60 mg daily.  Continue to titrate GDMT post bypass.

## 2021-09-07 NOTE — Assessment & Plan Note (Addendum)
S/p MI in 2015 tx with DES to [facility] and subsequent DES to LCx in 2015.  Cardiac catheterization was done last week for ongoing angina and a high risk Myoview.  EF is 40-45 by echocardiogram.  The cath demonstrated severe mid LAD and first diagonal stenosis, total occlusion of the LCx stent and diffuse moderate disease in the RCA with a patent proximal stent.  He has been referred to Dr. Kipp Brood and CABG is planned 09/21/2021.  He continues to have anginal symptoms.  EKG today demonstrates no significant changes.  I have recommended continued medical therapy and proceeding with bypass surgery on the 27th as planned.  I reviewed his case today with Dr. Gasper Sells (attending MD) who agreed.  Increase isosorbide mononitrate to 60 mg daily.  Continue metoprolol succinate 50 mg daily, aspirin 81 mg daily, rosuvastatin 40 mg daily.  He knows to go the emergency room if his symptoms should worsen.

## 2021-09-07 NOTE — Assessment & Plan Note (Signed)
Continue rosuvastatin 40mg daily  

## 2021-09-07 NOTE — Assessment & Plan Note (Signed)
Continue beta-blocker.  Reassess PVC burden post CABG.

## 2021-09-15 ENCOUNTER — Telehealth: Payer: Self-pay | Admitting: Family Medicine

## 2021-09-15 ENCOUNTER — Other Ambulatory Visit: Payer: Self-pay

## 2021-09-15 DIAGNOSIS — E118 Type 2 diabetes mellitus with unspecified complications: Secondary | ICD-10-CM

## 2021-09-15 MED ORDER — TECHLITE PEN NEEDLES 32G X 8 MM MISC: 3 refills | Status: DC

## 2021-09-15 MED ORDER — INSULIN GLARGINE 100 UNIT/ML ~~LOC~~ SOLN: 22.0000 [IU] | Freq: Every day | SUBCUTANEOUS | 1 refills | Status: DC

## 2021-09-15 NOTE — Telephone Encounter (Signed)
[name] also wanted to make sure Dr. Lorelei Pont knew pt was having heart surgery.   Medication: Insulin Pen Needle (TECHLITE PEN NEEDLES) 32G X 8 MM MISC   insulin glargine (LANTUS) 100 UNIT/ML injection  Has the patient contacted their pharmacy? No.   Preferred Pharmacy: [facility] [address] ([address]), [place]  [address] ([place]) [place] [address]  Phone:  [phone]  Fax:  [phone]

## 2021-09-15 NOTE — Telephone Encounter (Signed)
FYI on the surgery.

## 2021-09-16 NOTE — Progress Notes (Signed)
Surgical Instructions    Your procedure is scheduled on Tuesday, December 27th, 2022.   Report to [facility] Main Entrance "A" at 06:00 A.M., then check in with the Admitting office.  Call this number if you have problems the morning of surgery:  [phone]   If you have any questions prior to your surgery date call [phone]: Open Monday-Friday 8am-4pm    Remember:  Do not eat or drink after midnight the night before your surgery    Take these medicines the morning of surgery with A SIP OF WATER:  gabapentin (NEURONTIN) isosorbide mononitrate (IMDUR)  metoprolol succinate (TOPROL-XL) rosuvastatin (CRESTOR)  Follow your surgeon's instructions on when to stop Aspirin.  If no instructions were given by your surgeon then you will need to call the office to get those instructions.     As of today, STOP taking any Aspirin (unless otherwise instructed by your surgeon) Aleve, Naproxen, Ibuprofen, Motrin, Advil, Goody's, BC's, all herbal medications, fish oil, and all vitamins.    WHAT DO I DO ABOUT MY DIABETES MEDICATION?   THE NIGHT BEFORE SURGERY, take 50% of your usual dose of insulin glargine (LANTUS)         OR  THE MORNING OF SURGERY take 50% of your usual dose of insulin glargine (LANTUS)     HOW TO MANAGE YOUR DIABETES BEFORE AND AFTER SURGERY  Why is it important to control my blood sugar before and after surgery? Improving blood sugar levels before and after surgery helps healing and can limit problems. A way of improving blood sugar control is eating a healthy diet by:  Eating less sugar and carbohydrates  Increasing activity/exercise  Talking with your doctor about reaching your blood sugar goals High blood sugars (greater than 180 mg/dL) can raise your risk of infections and slow your recovery, [place] you will need to focus on controlling your diabetes during the weeks before surgery. Make sure that the doctor who takes care of your diabetes knows about your  planned surgery including the date and location.  How do I manage my blood sugar before surgery? Check your blood sugar at least 4 times a day, starting 2 days before surgery, to make sure that the level is not too high or low.  Check your blood sugar the morning of your surgery when you wake up and every 2 hours until you get to the Short Stay unit.  If your blood sugar is less than 70 mg/dL, you will need to treat for low blood sugar: Do not take insulin. Treat a low blood sugar (less than 70 mg/dL) with  cup of clear juice (cranberry or apple), 4 glucose tablets, OR glucose gel. Recheck blood sugar in 15 minutes after treatment (to make sure it is greater than 70 mg/dL). If your blood sugar is not greater than 70 mg/dL on recheck, call [phone] for further instructions. Report your blood sugar to the short stay nurse when you get to Short Stay.  If you are admitted to the hospital after surgery: Your blood sugar will be checked by the staff and you will probably be given insulin after surgery (instead of oral diabetes medicines) to make sure you have good blood sugar levels. The goal for blood sugar control after surgery is 80-180 mg/dL.    After your COVID test   You are not required to quarantine however you are required to wear a well-fitting mask when you are out and around people not in your household.  If your  mask becomes wet or soiled, replace with a new one.  Wash your hands often with soap and water for 20 seconds or clean your hands with an alcohol-based hand sanitizer that contains at least 60% alcohol.  Do not share personal items.  Notify your provider: if you are in close contact with someone who has COVID  or if you develop a fever of 100.4 or greater, sneezing, cough, sore throat, shortness of breath or body aches.    The day of surgery:          Do not wear jewelry  Do not wear lotions, powders, colognes, or deodorant. Men may shave face and neck. Do not  bring valuables to the hospital.              [facility] is not responsible for any belongings or valuables.  Do NOT Smoke (Tobacco/Vaping)  24 hours prior to your procedure  If you use a CPAP at night, you may bring your mask for your overnight stay.   Contacts, glasses, hearing aids, dentures or partials may not be worn into surgery, please bring cases for these belongings   For patients admitted to the hospital, discharge time will be determined by your treatment team.   Patients discharged the day of surgery will not be allowed to drive home, and someone needs to stay with them for 24 hours.  NO VISITORS WILL BE ALLOWED IN PRE-OP WHERE PATIENTS ARE PREPPED FOR SURGERY.  ONLY 1 SUPPORT PERSON MAY BE PRESENT IN THE WAITING ROOM WHILE YOU ARE IN SURGERY.  IF YOU ARE TO BE ADMITTED, ONCE YOU ARE IN YOUR ROOM YOU WILL BE ALLOWED TWO (2) VISITORS. 1 (ONE) VISITOR MAY STAY OVERNIGHT BUT MUST ARRIVE TO THE ROOM BY 8pm.  Minor children may have two parents present. Special consideration for safety and communication needs will be reviewed on a case by case basis.  Special instructions:    Oral Hygiene is also important to reduce your risk of infection.  Remember - BRUSH YOUR TEETH THE MORNING OF SURGERY WITH YOUR REGULAR TOOTHPASTE   [place]- Preparing For Surgery  Before surgery, you can play an important role. Because skin is not sterile, your skin needs to be as free of germs as possible. You can reduce the number of germs on your skin by washing with CHG (chlorahexidine gluconate) Soap before surgery.  CHG is an antiseptic cleaner which kills germs and bonds with the skin to continue killing germs even after washing.     Please do not use if you have an allergy to CHG or antibacterial soaps. If your skin becomes reddened/irritated stop using the CHG.  Do not shave (including legs and underarms) for at least 48 hours prior to first CHG shower. It is OK to shave your face.  Please  follow these instructions carefully.     Shower the NIGHT BEFORE SURGERY and the MORNING OF SURGERY with CHG Soap.   If you chose to wash your hair, wash your hair first as usual with your normal shampoo. After you shampoo, rinse your hair and body thoroughly to remove the shampoo.  Then [employer] and genitals (private parts) with your normal soap and rinse thoroughly to remove soap.  After that Use CHG Soap as you would any other liquid soap. You can apply CHG directly to the skin and wash gently with a scrungie or a clean washcloth.   Apply the CHG Soap to your body ONLY FROM THE NECK DOWN.  Do not use on open wounds or open sores. Avoid contact with your eyes, ears, mouth and genitals (private parts). Wash Face and genitals (private parts)  with your normal soap.   Wash thoroughly, paying special attention to the area where your surgery will be performed.  Thoroughly rinse your body with warm water from the neck down.  DO NOT shower/wash with your normal soap after using and rinsing off the CHG Soap.  Pat yourself dry with a CLEAN TOWEL.  Wear CLEAN PAJAMAS to bed the night before surgery  Place CLEAN SHEETS on your bed the night before your surgery  DO NOT SLEEP WITH PETS.   Day of Surgery:  Take a shower with CHG soap. Wear Clean/Comfortable clothing the morning of surgery Do not apply any deodorants/lotions.   Remember to brush your teeth WITH YOUR REGULAR TOOTHPASTE.   Please read over the following fact sheets that you were given.

## 2021-09-17 ENCOUNTER — Ambulatory Visit (HOSPITAL_COMMUNITY)
Admission: RE | Admit: 2021-09-17 | Discharge: 2021-09-17 | Disposition: A | Discharge status: Home / Self Care | Payer: Medicare Other | Source: Ambulatory Visit | Attending: Thoracic Surgery (Cardiothoracic Vascular Surgery) | Admitting: Thoracic Surgery (Cardiothoracic Vascular Surgery)

## 2021-09-17 ENCOUNTER — Other Ambulatory Visit: Payer: Self-pay

## 2021-09-17 ENCOUNTER — Encounter (HOSPITAL_COMMUNITY)
Admission: RE | Admit: 2021-09-17 | Discharge: 2021-09-17 | Disposition: A | Discharge status: Home / Self Care | Payer: Medicare Other | Source: Ambulatory Visit | Attending: Thoracic Surgery (Cardiothoracic Vascular Surgery) | Admitting: Thoracic Surgery (Cardiothoracic Vascular Surgery)

## 2021-09-17 ENCOUNTER — Encounter (HOSPITAL_COMMUNITY): Payer: Self-pay

## 2021-09-17 VITALS — BP 126/73 | Temp 98.5°F | Resp 18 | Ht 65.0 in | Wt 166.0 lb

## 2021-09-17 DIAGNOSIS — I252 Old myocardial infarction: Secondary | ICD-10-CM | POA: Diagnosis not present

## 2021-09-17 DIAGNOSIS — Z20822 Contact with and (suspected) exposure to covid-19: Secondary | ICD-10-CM | POA: Diagnosis not present

## 2021-09-17 DIAGNOSIS — Z8673 Personal history of transient ischemic attack (TIA), and cerebral infarction without residual deficits: Secondary | ICD-10-CM | POA: Diagnosis not present

## 2021-09-17 DIAGNOSIS — I509 Heart failure, unspecified: Secondary | ICD-10-CM | POA: Insufficient documentation

## 2021-09-17 DIAGNOSIS — N183 Chronic kidney disease, stage 3 unspecified: Secondary | ICD-10-CM | POA: Insufficient documentation

## 2021-09-17 DIAGNOSIS — I130 Hypertensive heart and chronic kidney disease with heart failure and stage 1 through stage 4 chronic kidney disease, or unspecified chronic kidney disease: Secondary | ICD-10-CM | POA: Insufficient documentation

## 2021-09-17 DIAGNOSIS — Z955 Presence of coronary angioplasty implant and graft: Secondary | ICD-10-CM | POA: Insufficient documentation

## 2021-09-17 DIAGNOSIS — E1122 Type 2 diabetes mellitus with diabetic chronic kidney disease: Secondary | ICD-10-CM | POA: Diagnosis not present

## 2021-09-17 DIAGNOSIS — Z01818 Encounter for other preprocedural examination: Secondary | ICD-10-CM

## 2021-09-17 DIAGNOSIS — I2510 Atherosclerotic heart disease of native coronary artery without angina pectoris: Secondary | ICD-10-CM | POA: Insufficient documentation

## 2021-09-17 DIAGNOSIS — Z87891 Personal history of nicotine dependence: Secondary | ICD-10-CM | POA: Insufficient documentation

## 2021-09-17 DIAGNOSIS — E785 Hyperlipidemia, unspecified: Secondary | ICD-10-CM | POA: Diagnosis not present

## 2021-09-17 HISTORY — DX: Angina pectoris, unspecified: I20.9

## 2021-09-17 LAB — URINALYSIS, ROUTINE W REFLEX MICROSCOPIC
Bilirubin Urine: NEGATIVE
Glucose, UA: >=500 mg/dL — AB
Ketones, ur: NEGATIVE mg/dL
Leukocytes,Ua: NEGATIVE
Nitrite: NEGATIVE
Protein, ur: NEGATIVE mg/dL
Specific Gravity, Urine: 1.01 (ref 1.005–1.030)
pH: 6.5 (ref 5.0–8.0)

## 2021-09-17 LAB — CBC
HCT: 47.1 % (ref 39.0–52.0)
Hemoglobin: 15 g/dL (ref 13.0–17.0)
MCH: 28.5 pg (ref 26.0–34.0)
MCHC: 31.8 g/dL (ref 30.0–36.0)
MCV: 89.4 fL (ref 80.0–100.0)
Platelets: 147 10*3/uL — ABNORMAL LOW (ref 150–400)
RBC: 5.27 MIL/uL (ref 4.22–5.81)
RDW: 12.2 % (ref 11.5–15.5)
WBC: 8.7 10*3/uL (ref 4.0–10.5)
nRBC: 0 % (ref 0.0–0.2)

## 2021-09-17 LAB — COMPREHENSIVE METABOLIC PANEL
ALT: 22 U/L (ref 0–44)
AST: 19 U/L (ref 15–41)
Albumin: 4 g/dL (ref 3.5–5.0)
Alkaline Phosphatase: 78 U/L (ref 38–126)
Anion gap: 12 (ref 5–15)
BUN: 21 mg/dL (ref 8–23)
CO2: 22 mmol/L (ref 22–32)
Calcium: 9.2 mg/dL (ref 8.9–10.3)
Chloride: 102 mmol/L (ref 98–111)
Creatinine, Ser: 1.34 mg/dL — ABNORMAL HIGH (ref 0.61–1.24)
GFR, Estimated: 57 mL/min — ABNORMAL LOW (ref >60)
Glucose, Bld: 333 mg/dL — ABNORMAL HIGH (ref 70–99)
Potassium: 4.1 mmol/L (ref 3.5–5.1)
Sodium: 136 mmol/L (ref 135–145)
Total Bilirubin: 0.7 mg/dL (ref 0.3–1.2)
Total Protein: 7.2 g/dL (ref 6.5–8.1)

## 2021-09-17 LAB — BLOOD GAS, ARTERIAL
Acid-base deficit: 0.2 mmol/L (ref 0.0–2.0)
Bicarbonate: 23.7 mmol/L (ref 20.0–28.0)
Drawn by: 58793
FIO2: 21
O2 Saturation: 98.4 %
Patient temperature: 37
pCO2 arterial: 37 mmHg (ref 32.0–48.0)
pH, Arterial: 7.422 (ref 7.350–7.450)
pO2, Arterial: 107 mmHg (ref 83.0–108.0)

## 2021-09-17 LAB — URINALYSIS, MICROSCOPIC (REFLEX)
Squamous Epithelial / HPF: NONE SEEN (ref 0–5)
WBC, UA: NONE SEEN WBC/hpf (ref 0–5)

## 2021-09-17 LAB — PROTIME-INR
INR: 0.9 (ref 0.8–1.2)
Prothrombin Time: 12.3 seconds (ref 11.4–15.2)

## 2021-09-17 LAB — HEMOGLOBIN A1C
Hgb A1c MFr Bld: 8.9 % — ABNORMAL HIGH (ref 4.8–5.6)
Mean Plasma Glucose: 208.73 mg/dL

## 2021-09-17 LAB — SARS CORONAVIRUS 2 (TAT 6-24 HRS): SARS Coronavirus 2: NEGATIVE

## 2021-09-17 LAB — APTT: aPTT: 29 seconds (ref 24–36)

## 2021-09-17 LAB — TYPE AND SCREEN
ABO/RH(D): O POS
Antibody Screen: NEGATIVE

## 2021-09-17 LAB — GLUCOSE, CAPILLARY: Glucose-Capillary: 387 mg/dL — ABNORMAL HIGH (ref 70–99)

## 2021-09-17 LAB — SURGICAL PCR SCREEN
MRSA, PCR: NEGATIVE
Staphylococcus aureus: NEGATIVE

## 2021-09-17 NOTE — Progress Notes (Signed)
Anesthesia Chart Review:  Case: [id number] Date/Time: 09/21/21 0745   Procedures:      CORONARY ARTERY BYPASS GRAFTING (CABG) (Chest)     TRANSESOPHAGEAL ECHOCARDIOGRAM (TEE)   Anesthesia type: General   Pre-op diagnosis: CAD   Location: [facility] OR ROOM 65 / [facility] OR   Surgeons: Lajuana Matte, MD       DISCUSSION: Patient is a 70 year old male scheduled for the above procedure. He speaks [place].  History includes former smoker (quit 09/26/06), HTN, CAD (NSTEMI, s/p DES pRCA 02/26/14; DES mLCX 03/10/14 for unstable angina), HFmrEF, PVCs, DM2 (diagnosed 2008), left iliac artery aneurysm (stable ectasia 1.7 cm 11/16/17 CTA), SMA chronic dissection (diagnosed 2014, stable 11/16/17 CTA), HLD, CVA (2011), CKD.   Preoperative labs show A1c 8.9%, previously 8.7% 06/07/21 and 10.2% 10/23/20. Creatinine 1.34, previously 1.02-1.35 since 05/2021. H/H 15.0/47.1, PLT 147K.   09/17/21 presurgical COVID-19 test and CXR are in process. Anesthesia team to evaluate on the day of surgery.   VS: BP 126/73    Temp 36.9 C (Oral)    Resp 18    Ht _0  (1.651 m)    Wt 75.3 kg    BMI 27.62 kg/m    PROVIDERS: Copland, Gay Filler, MD is PCP  Sherren Mocha, MD is primary cardiologist Renato Shin, MD is endocrinologist. Last visit 10/23/20.  Brabham, V. Rock Nephew, MD is vascular surgeon. Last visit 11/20/17. Follow-up in 2 years planned for chronic mesenteric dissection, penetrating aortic ulcers without associated aneurysmal changes.    LABS: Preoperative labs reviewed. See DISCUSSION.  (all labs ordered are listed, but only abnormal results are displayed)  Labs Reviewed  GLUCOSE, CAPILLARY - Abnormal; Notable for the following components:      Result Value   Glucose-Capillary 387 (*)    All other components within normal limits  CBC - Abnormal; Notable for the following components:   Platelets 147 (*)    All other components within normal limits  COMPREHENSIVE METABOLIC PANEL - Abnormal; Notable for the following  components:   Glucose, Bld 333 (*)    Creatinine, Ser 1.34 (*)    GFR, Estimated 57 (*)    All other components within normal limits  URINALYSIS, ROUTINE W REFLEX MICROSCOPIC - Abnormal; Notable for the following components:   Glucose, UA >=500 (*)    Hgb urine dipstick MODERATE (*)    All other components within normal limits  HEMOGLOBIN A1C - Abnormal; Notable for the following components:   Hgb A1c MFr Bld 8.9 (*)    All other components within normal limits  URINALYSIS, MICROSCOPIC (REFLEX) - Abnormal; Notable for the following components:   Bacteria, UA RARE (*)    All other components within normal limits  SURGICAL PCR SCREEN  SARS CORONAVIRUS 2 (TAT 6-24 HRS)  PROTIME-INR  APTT  BLOOD GAS, ARTERIAL  TYPE AND SCREEN     IMAGES: CXR 09/17/21: In process.   CTA abd/pelvis 11/16/17: IMPRESSION: VASCULAR  1. Stable appearance of non flow limiting chronic dissection of the superior mesenteric artery. 2. Interval progression of a penetrating aortic ulcer in the distal abdominal aorta now resulting in a short segment non flow limiting chronic dissection. The maximal aortic diameter at this location is mildly ectatic at 2.2 cm. 3. Interval progression of a penetrating ulcer arising from the inferior wall of the left common iliac artery also now resulting in a chronic, non flow limiting short segment dissection. 4. Progressive fibrofatty plaque results in narrow high-grade stenosis in the left hypogastric  artery. 5. Mild-to-moderate 2.3 cm stenosis of the proximal superior mesenteric artery, no significant change. 6. Stable ectasia of the left common iliac artery to 1.7 cm. 7. Irregular predominantly fibrofatty atherosclerotic plaque throughout the abdominal aorta. Aortic Atherosclerosis (ICD10-170.0).   NON-VASCULAR 1. No acute abnormality within the abdomen or pelvis. 2. Stable appearance of the distal tendons with remains thickened 1.1 cm. There has been no interval  change over multiple prior studies dating back to at least February of 2014 suggesting a benign process. A benign or indolent appendiceal carcinoid is a consideration. 3. Cholelithiasis. 4. Right lower pole nephrolithiasis.   EKG: 09/07/21: SB at 58 bpm. Non-specific ST/T wave changes.    CV: US Carotid 09/17/21 (PRELIMINARY): Summary:  - Right Carotid: The extracranial vessels were near-normal with only minimal wall thickening or plaque.  - Left Carotid: The extracranial vessels were near-normal with only minimal wall thickening or plaque.  - Vertebrals:  Bilateral vertebral arteries demonstrate antegrade flow.  - Subclavians: Normal flow hemodynamics were seen in the left subclavian artery. Elevated velocities seen in the right subclavian artery.    Cardiac cath 08/31/21: Severe mid LAD disease.  Severe first diagonal disease. Total occlusion of the proximal to mid circumflex after the first obtuse marginal within a previously placed stent.  Circumflex has left to left collaterals. Right coronary contains a proximal stent with 40% ISR, distal RCA contains 60 to 70% stenosis, continuation of RCA contains segmental 50% stenosis, a small third left ventricular branch contains 75% ostial to proximal stenosis. Diffuse 30% mid to distal left main narrowing Left ventricular dysfunction with anteroapical severe hypokinesis.  LVEF 40%.  LVEDP is normal.   RECOMMENDATION: 70 year old diabetic with total occlusion due to in-stent restenosis in the circumflex, severe disease in the proximal to mid LAD and first diagonal with moderate disease in the right coronary.  Given decreased LV function, coronary artery surgical revascularization is indicated.   Echo 07/14/21: IMPRESSIONS   1. Frequent PVCs during exam.   2. Left ventricular ejection fraction, by estimation, is 40 to 45% with  beat to beat variability. The left ventricle has mildly decreased  function. The left ventricle demonstrates  regional wall motion  abnormalities (see scoring diagram/findings for  description). There is mild left ventricular hypertrophy. Left ventricular  diastolic parameters are consistent with Grade I diastolic dysfunction  (impaired relaxation).   3. Right ventricular systolic function is mildly reduced. The right  ventricular size is normal. There is normal pulmonary artery systolic  pressure. The estimated right ventricular systolic pressure is 77.8 mmHg.   4. The mitral valve is grossly normal. Trivial mitral valve  regurgitation. No evidence of mitral stenosis.   5. The aortic valve is grossly normal. There is mild calcification of the  aortic valve. Aortic valve regurgitation is trivial. No aortic stenosis is  present.   6. The inferior vena cava is normal in size with greater than 50%  respiratory variability, suggesting right atrial pressure of 3 mmHg.    Long term cardiac event monitor 07/12/21-07/15/21: Patch Wear Time:  2 days and 21 hours (2022-10-17T08:22:40-0400 to 2022-10-20T06:00:06-399)   Patient had a min HR of 48 bpm, max HR of 169 bpm, and avg HR of 71 bpm. Predominant underlying rhythm was Sinus Rhythm. 2 Supraventricular Tachycardia runs occurred, the run with the fastest interval lasting 12 beats with a max rate of 169 bpm (avg 136  bpm); the run with the fastest interval was also the longest. Isolated SVEs were rare (<1.0%), SVE  Triplets were rare (<1.0%), and no SVE Couplets were present. Isolated VEs were frequent (20.5%, 63250), VE Couplets were rare (<1.0%, 1396), and no VE  Triplets were present. Ventricular Bigeminy and Trigeminy were present.    SUMMARY: The basic rhythm is normal sinus with an average HR of 71 bpm No atrial fibrillation or flutter No high-grade heart block or pathologic pauses There are frequent PVC's with an overall burden in excess of 20% and rare supraventricular beats without sustained arrhythmias No ventricular tachycardia   Past  Medical History:  Diagnosis Date   Anginal pain ([place])    CAD (coronary artery disease)    LHC (02/26/14):  >> PCI:  Xience DES to pRCA // LHC (03/10/14):  >> PCI:  Xience DES to [facility] // Myoview 2/16: low risk // Cath 2016: patent stents; stable dz - med Rx // Myoview 10/22: anterolateral ischemia, inferoapical infarct w peri infarct ischemia; EF 41; High Risk // Cath 12/22: LCx stent occl, severe LAD and Dx dz, diffuse RCA disease >> CABG   Carotid stenosis    a. Carotid US (02/27/14):  Bilateral ICA 1-39%   CKD (chronic kidney disease) stage 3    Depression    Diabetes mellitus    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[place])    a. Mesenteric Artery Duplex (7/15):  pSMA chronic dissection with aneurysmal dilation of 1.29 cm (VVS);  b.  Chest CTA (02/26/14):  IMPRESSION:  1. No aortic  dissection or other acute abnormality.  2. 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  3. Stable 17 mm ectasia of the left common iliac artery.  4. Atherosclerosis, including aortoiliac and coronary artery disease.    HFmrEF (heart failure with mildly reduced EF) 09/2015   Echo 6/15: EF 50-55 // Echo 1/17: mod LVH, EF 55%, inf-lat HK, Gr 1 DD, mild LAE // Echocardiogram 10/22: EF 40-45, Gr 1 DD, mildly reduced RVSF, RVSP 26.6, trivial MR, trivial AI, post and ant-lat HK   Hyperlipidemia    Hypertension    Iliac artery aneurysm, left (HCC)    Myocardial infarction (HCC)    PVC's (premature ventricular contractions) 07/21/2021   Monitor 10/22:  NSR avg HR 71; no AF/Flutter; no high grade HB or pathologic pauses PVC burden 20%, rare supraventricular beats w/o sustained arrhythmia    Stroke ([place]) 09/26/2009    Past Surgical History:  Procedure Laterality Date   CARDIAC CATHETERIZATION     CARDIAC CATHETERIZATION N/A 06/25/2015   Procedure: Left Heart Cath and Coronary Angiography;  Surgeon: Burnell Blanks, MD;  Location: [place] CV LAB;  Service: Cardiovascular;  Laterality: N/A;   COLONOSCOPY WITH  PROPOFOL N/A 07/23/2015   Procedure: COLONOSCOPY WITH PROPOFOL;  Surgeon: [name], MD;  Location: [facility] END[facility];  Service: Endoscopy;  Laterality: N/A;   CORONARY ANGIOPLASTY     LEFT HEART CATH AND CORONARY ANGIOGRAPHY N/A 08/31/2021   Procedure: LEFT HEART CATH AND CORONARY ANGIOGRAPHY;  Surgeon: Belva Crome, MD;  Location: [place] CV LAB;  Service: Cardiovascular;  Laterality: N/A;   LEFT HEART CATHETERIZATION WITH CORONARY ANGIOGRAM N/A 02/26/2014   Procedure: LEFT HEART CATHETERIZATION WITH CORONARY ANGIOGRAM;  Surgeon: Wellington Hampshire, MD;  Location: [place] CATH LAB;  Service: Cardiovascular;  Laterality: N/A;   LEFT HEART CATHETERIZATION WITH CORONARY ANGIOGRAM N/A 03/10/2014   Procedure: LEFT HEART CATHETERIZATION WITH CORONARY ANGIOGRAM;  Surgeon: Jettie Booze, MD;  Location: [facility] C[facility] LAB;  Service: Cardiovascular;  Laterality: N/A;    MEDICATIONS:  aspirin EC 81  MG EC tablet   blood glucose meter kit and supplies   gabapentin (NEURONTIN) 300 MG capsule   glucose blood (ONETOUCH ULTRA) test strip   insulin glargine (LANTUS) 100 UNIT/ML injection   Insulin Pen Needle (TECHLITE PEN NEEDLES) 32G X 8 MM MISC   isosorbide mononitrate (IMDUR) 60 MG 24 hr tablet   Lancets (ONETOUCH DELICA PLUS [id number]) MISC   lisinopril (ZESTRIL) 20 MG tablet   metoprolol succinate (TOPROL-XL) 50 MG 24 hr tablet   rosuvastatin (CRESTOR) 40 MG tablet   No current facility-administered medications for this encounter.    Myra Gianotti, PA-C Surgical Short Stay/Anesthesiology [facility] Phone [phone] [facility] Phone [phone] 09/17/2021 2:11 PM

## 2021-09-17 NOTE — Anesthesia Preprocedure Evaluation (Addendum)
Anesthesia Evaluation  Patient identified by MRN, date of birth, ID band Patient awake    Reviewed: Allergy & Precautions, NPO status , Patient's Chart, lab work & pertinent test results  Airway Mallampati: II  TM Distance: >3 FB Neck ROM: Full    Dental  (+) Dental Advisory Given   Pulmonary neg pulmonary ROS, former smoker,    Pulmonary exam normal breath sounds clear to auscultation       Cardiovascular hypertension, Pt. on home beta blockers and Pt. on medications + angina + CAD, + Past MI, + Cardiac Stents, + Peripheral Vascular Disease and +CHF  Normal cardiovascular exam Rhythm:Regular Rate:Normal  ECHO 06/2021 1. Frequent PVCs during exam.  2. Left ventricular ejection fraction, by estimation, is 40 to 45% with beat to beat variability. The left ventricle has mildly decreased function. The left ventricle demonstrates regional wall motion abnormalities (see scoring diagram/findings for description). There is mild left ventricular hypertrophy. Left ventricular diastolic parameters are consistent with Grade I diastolic dysfunction (impaired relaxation).  3. Right ventricular systolic function is mildly reduced. The right ventricular size is normal. There is normal pulmonary artery systolic pressure. The estimated right ventricular systolic pressure is 16.0 mmHg.  4. The mitral valve is grossly normal. Trivial mitral valve  regurgitation. No evidence of mitral stenosis.  5. The aortic valve is grossly normal. There is mild calcification of the aortic valve. Aortic valve regurgitation is trivial. No aortic stenosis is present.  6. The inferior vena cava is normal in size with greater than 50% respiratory variability, suggesting right atrial pressure of 3 mmHg.    Neuro/Psych PSYCHIATRIC DISORDERS Depression TIACVA    GI/Hepatic negative GI ROS, Neg liver ROS,   Endo/Other  negative endocrine ROSdiabetes  Renal/GU Renal  disease     Musculoskeletal negative musculoskeletal ROS (+)   Abdominal   Peds  Hematology negative hematology ROS (+)   Anesthesia Other Findings   Reproductive/Obstetrics                                                            Anesthesia Evaluation  Patient identified by MRN, date of birth, ID band Patient awake    Reviewed: Allergy & Precautions, H&P , NPO status , Patient's Chart, lab work & pertinent test results, reviewed documented beta blocker date and time   Airway Mallampati: II  TM Distance: >3 FB Neck ROM: full    Dental  (+) Dental Advisory Given, Caps All upper front are capped:   Pulmonary neg pulmonary ROS, former smoker,    Pulmonary exam normal breath sounds clear to auscultation       Cardiovascular Exercise Tolerance: Good hypertension, Pt. on medications and Pt. on home beta blockers + CAD, + Past MI, + Peripheral Vascular Disease and +CHF  Normal cardiovascular exam Rhythm:regular Rate:Normal  EF 50%. Diastolic dysfunction.  NSTEMI 6/15   Neuro/Psych Weakness left arm. Carotid stenosis TIACVA, Residual Symptoms negative psych ROS   GI/Hepatic negative GI ROS, Neg liver ROS, GERD  Medicated and Controlled,  Endo/Other  diabetes, Well Controlled, Type 2, Insulin Dependent  Renal/GU negative Renal ROSCRT 1.32  negative genitourinary   Musculoskeletal   Abdominal   Peds  Hematology negative hematology ROS (+)   Anesthesia Other Findings   Reproductive/Obstetrics negative OB ROS  Anesthesia Physical Anesthesia Plan  ASA: III  Anesthesia Plan: MAC   Post-op Pain Management:    Induction:   Airway Management Planned:   Additional Equipment:   Intra-op Plan:   Post-operative Plan:   Informed Consent: I have reviewed the patients History and Physical, chart, labs and discussed the procedure including the risks, benefits and alternatives  for the proposed anesthesia with the patient or authorized representative who has indicated his/her understanding and acceptance.   Dental Advisory Given  Plan Discussed with: CRNA and Surgeon  Anesthesia Plan Comments:         Anesthesia Quick Evaluation  Anesthesia Physical Anesthesia Plan  ASA: 4  Anesthesia Plan: General   Post-op Pain Management:    Induction: Intravenous  PONV Risk Score and Plan: 3 and Treatment may vary due to age or medical condition and Midazolam  Airway Management Planned: Oral ETT  Additional Equipment: Arterial line, CVP, TEE, Ultrasound Guidance Line Placement and None  Intra-op Plan: Utilization Of Total Body Hypothermia per surgeon request  Post-operative Plan: Post-operative intubation/ventilation  Informed Consent: I have reviewed the patients History and Physical, chart, labs and discussed the procedure including the risks, benefits and alternatives for the proposed anesthesia with the patient or authorized representative who has indicated his/her understanding and acceptance.     Dental advisory given  Plan Discussed with: CRNA  Anesthesia Plan Comments: (PAT note written 09/17/2021 by Myra Gianotti, PA-C. )      Anesthesia Quick Evaluation

## 2021-09-17 NOTE — Progress Notes (Signed)
Abnormal labs in PAT. Dr. Kipp Brood office was notified - A1C 8.9; Urinalysis: Bacteria, UA - rare; Glucose, UA - > 500; Hgb urine dipstick - moderate Thurmond Butts, [place], Therapist, sports).

## 2021-09-17 NOTE — Progress Notes (Addendum)
PCP - Lamar Blinks, MD Cardiologist - Sherren Mocha, MD  PPM/ICD - denies Device Orders - n/a Rep Notified - n/a  Chest x-ray - 09/17/2021 EKG - 09/07/2021 Stress Test - 07/15/2021 ECHO - 07/14/2021 Cardiac Cath - 08/31/2021  Sleep Study - denies CPAP - n/a  Fasting Blood Sugar - patient verbalized that he is checking CBG after meals and is high Checks Blood Sugar 2 - 3 times a day CBG today 387 A1C done in PAT on 09/17/2021  Blood Thinner Instructions: n/a  Aspirin Instructions: Patient will hold Aspirin the day of surgery  Patient was instructed: As of today, STOP taking any Aspirin (unless otherwise instructed by your surgeon) Aleve, Naproxen, Ibuprofen, Motrin, Advil, Goody's, BC's, all herbal medications, fish oil, and all vitamins.    ERAS Protcol - n/a   COVID TEST- done in PAT on 09/17/2021   Anesthesia review: yes - cardiac history; abnormal labs in PAT - Dr. Kipp Brood notified.  CBG elevated in PAT. His CBG is 387 and he had his Insulin this morning. Patient is asymptomatic, no acute distress noted, no complaints. Patient verbalized that he ate this morning bread, peanut butter and he had a coffee. Patient said he is checking his CBG in general after he is eating. Patient was instructed to check what he is eating and to check his CBG before meal and in two hrs after. Patient son is with him and he said he will talk with his mom about his father diet. No other complaints. Patient and his son were instructed to monitor more often CBG and if will have any distress to contact PCP. Patient and his son verbalized understanding.  Patient denies shortness of breath, fever, cough and chest pain at PAT appointment   All instructions explained to the patient, with a verbal understanding of the material. Patient agrees to go over the instructions while at home for a better understanding. Patient also instructed to self quarantine after being tested for COVID-19. The  opportunity to ask questions was provided.

## 2021-09-20 MED ORDER — TRANEXAMIC ACID (OHS) BOLUS VIA INFUSION
15.0000 mg/kg | INTRAVENOUS | Status: AC
  Administered 2021-09-21: 08:00:00 1005 mg via INTRAVENOUS
  Filled 2021-09-20: qty 1005

## 2021-09-20 MED ORDER — MILRINONE LACTATE IN DEXTROSE 20-5 MG/100ML-% IV SOLN
0.3000 ug/kg/min | INTRAVENOUS | Status: DC
  Filled 2021-09-20: qty 100

## 2021-09-20 MED ORDER — NITROGLYCERIN IN D5W 200-5 MCG/ML-% IV SOLN
2.0000 ug/min | INTRAVENOUS | Status: AC
  Administered 2021-09-21: 12:00:00 25 ug/min via INTRAVENOUS
  Filled 2021-09-20: qty 250

## 2021-09-20 MED ORDER — NOREPINEPHRINE 4 MG/250ML-% IV SOLN
0.0000 ug/min | INTRAVENOUS | Status: DC
  Filled 2021-09-20: qty 250

## 2021-09-20 MED ORDER — POTASSIUM CHLORIDE 2 MEQ/ML IV SOLN
80.0000 meq | INTRAVENOUS | Status: DC
  Filled 2021-09-20: qty 40

## 2021-09-20 MED ORDER — CEFAZOLIN SODIUM-DEXTROSE 2-4 GM/100ML-% IV SOLN
2.0000 g | INTRAVENOUS | Status: AC
  Administered 2021-09-21: 12:00:00 2 g via INTRAVENOUS
  Filled 2021-09-20: qty 100

## 2021-09-20 MED ORDER — HEPARIN 30,000 UNITS/1000 ML (OHS) CELLSAVER SOLUTION
Status: DC
  Filled 2021-09-20: qty 1000

## 2021-09-20 MED ORDER — INSULIN REGULAR(HUMAN) IN NACL 100-0.9 UT/100ML-% IV SOLN
INTRAVENOUS | Status: AC
  Administered 2021-09-21: 10:00:00 .6 [IU]/h via INTRAVENOUS
  Filled 2021-09-20: qty 100

## 2021-09-20 MED ORDER — TRANEXAMIC ACID 1000 MG/10ML IV SOLN
1.5000 mg/kg/h | INTRAVENOUS | Status: AC
  Administered 2021-09-21: 09:00:00 1.5 mg/kg/h via INTRAVENOUS
  Filled 2021-09-20: qty 25

## 2021-09-20 MED ORDER — DEXMEDETOMIDINE HCL IN NACL 400 MCG/100ML IV SOLN
0.1000 ug/kg/h | INTRAVENOUS | Status: AC
Start: 2021-09-21 — End: 2021-09-21
  Administered 2021-09-21: 11:00:00 .7 ug/kg/h via INTRAVENOUS
  Filled 2021-09-20: qty 100

## 2021-09-20 MED ORDER — MANNITOL 20 % IV SOLN
INTRAVENOUS | Status: DC
  Filled 2021-09-20: qty 13

## 2021-09-20 MED ORDER — VANCOMYCIN HCL 1250 MG/250ML IV SOLN
1250.0000 mg | INTRAVENOUS | Status: AC
  Administered 2021-09-21: 08:00:00 1250 mg via INTRAVENOUS
  Filled 2021-09-20: qty 250

## 2021-09-20 MED ORDER — TRANEXAMIC ACID (OHS) PUMP PRIME SOLUTION
2.0000 mg/kg | INTRAVENOUS | Status: DC
  Filled 2021-09-20: qty 1.51

## 2021-09-20 MED ORDER — PHENYLEPHRINE HCL-NACL 20-0.9 MG/250ML-% IV SOLN
30.0000 ug/min | INTRAVENOUS | Status: AC
  Administered 2021-09-21: 08:00:00 25 ug/min via INTRAVENOUS
  Filled 2021-09-20: qty 250

## 2021-09-20 MED ORDER — PLASMA-LYTE A IV SOLN
INTRAVENOUS | Status: DC
  Filled 2021-09-20: qty 5

## 2021-09-20 MED ORDER — EPINEPHRINE HCL 5 MG/250ML IV SOLN IN NS
0.0000 ug/min | INTRAVENOUS | Status: DC
  Filled 2021-09-20: qty 250

## 2021-09-20 MED ORDER — CEFAZOLIN SODIUM-DEXTROSE 2-4 GM/100ML-% IV SOLN
2.0000 g | INTRAVENOUS | Status: AC
  Administered 2021-09-21: 08:00:00 2 g via INTRAVENOUS
  Filled 2021-09-20: qty 100

## 2021-09-21 ENCOUNTER — Inpatient Hospital Stay (HOSPITAL_COMMUNITY): Payer: Medicare Other | Admitting: Certified Registered Nurse Anesthetist

## 2021-09-21 ENCOUNTER — Inpatient Hospital Stay (HOSPITAL_COMMUNITY)
Admission: RE | Admit: 2021-09-21 | Discharge: 2021-09-28 | DRG: 235 | Disposition: A | Discharge status: Home / Self Care | Payer: Medicare Other | Attending: Thoracic Surgery (Cardiothoracic Vascular Surgery) | Admitting: Thoracic Surgery (Cardiothoracic Vascular Surgery)

## 2021-09-21 ENCOUNTER — Other Ambulatory Visit (HOSPITAL_COMMUNITY): Payer: Self-pay

## 2021-09-21 ENCOUNTER — Inpatient Hospital Stay (HOSPITAL_COMMUNITY): Payer: Medicare Other

## 2021-09-21 ENCOUNTER — Encounter (HOSPITAL_COMMUNITY): Payer: Self-pay | Admitting: Thoracic Surgery (Cardiothoracic Vascular Surgery)

## 2021-09-21 ENCOUNTER — Inpatient Hospital Stay (HOSPITAL_COMMUNITY)
Admission: RE | Disposition: A | Discharge status: Home / Self Care | Payer: Self-pay | Source: Home / Self Care | Attending: Thoracic Surgery (Cardiothoracic Vascular Surgery)

## 2021-09-21 ENCOUNTER — Inpatient Hospital Stay (HOSPITAL_COMMUNITY): Payer: Medicare Other | Admitting: Vascular Surgery

## 2021-09-21 DIAGNOSIS — Y839 Surgical procedure, unspecified as the cause of abnormal reaction of the patient, or of later complication, without mention of misadventure at the time of the procedure: Secondary | ICD-10-CM | POA: Diagnosis not present

## 2021-09-21 DIAGNOSIS — Z8249 Family history of ischemic heart disease and other diseases of the circulatory system: Secondary | ICD-10-CM

## 2021-09-21 DIAGNOSIS — T8119XA Other postprocedural shock, initial encounter: Secondary | ICD-10-CM | POA: Diagnosis not present

## 2021-09-21 DIAGNOSIS — I5022 Chronic systolic (congestive) heart failure: Secondary | ICD-10-CM | POA: Diagnosis present

## 2021-09-21 DIAGNOSIS — I25119 Atherosclerotic heart disease of native coronary artery with unspecified angina pectoris: Secondary | ICD-10-CM | POA: Diagnosis present

## 2021-09-21 DIAGNOSIS — I2510 Atherosclerotic heart disease of native coronary artery without angina pectoris: Principal | ICD-10-CM | POA: Diagnosis present

## 2021-09-21 DIAGNOSIS — I2584 Coronary atherosclerosis due to calcified coronary lesion: Secondary | ICD-10-CM | POA: Diagnosis present

## 2021-09-21 DIAGNOSIS — Z951 Presence of aortocoronary bypass graft: Secondary | ICD-10-CM | POA: Diagnosis not present

## 2021-09-21 DIAGNOSIS — Z87891 Personal history of nicotine dependence: Secondary | ICD-10-CM

## 2021-09-21 DIAGNOSIS — E8770 Fluid overload, unspecified: Secondary | ICD-10-CM | POA: Diagnosis not present

## 2021-09-21 DIAGNOSIS — I723 Aneurysm of iliac artery: Secondary | ICD-10-CM | POA: Diagnosis not present

## 2021-09-21 DIAGNOSIS — I081 Rheumatic disorders of both mitral and tricuspid valves: Secondary | ICD-10-CM | POA: Diagnosis not present

## 2021-09-21 DIAGNOSIS — Z888 Allergy status to other drugs, medicaments and biological substances status: Secondary | ICD-10-CM

## 2021-09-21 DIAGNOSIS — K567 Ileus, unspecified: Secondary | ICD-10-CM | POA: Diagnosis not present

## 2021-09-21 DIAGNOSIS — J939 Pneumothorax, unspecified: Secondary | ICD-10-CM

## 2021-09-21 DIAGNOSIS — J9811 Atelectasis: Secondary | ICD-10-CM | POA: Diagnosis not present

## 2021-09-21 DIAGNOSIS — R0689 Other abnormalities of breathing: Secondary | ICD-10-CM | POA: Diagnosis not present

## 2021-09-21 DIAGNOSIS — K6389 Other specified diseases of intestine: Secondary | ICD-10-CM | POA: Diagnosis not present

## 2021-09-21 DIAGNOSIS — I130 Hypertensive heart and chronic kidney disease with heart failure and stage 1 through stage 4 chronic kidney disease, or unspecified chronic kidney disease: Secondary | ICD-10-CM | POA: Diagnosis not present

## 2021-09-21 DIAGNOSIS — R10819 Abdominal tenderness, unspecified site: Secondary | ICD-10-CM | POA: Diagnosis not present

## 2021-09-21 DIAGNOSIS — Z09 Encounter for follow-up examination after completed treatment for conditions other than malignant neoplasm: Secondary | ICD-10-CM

## 2021-09-21 DIAGNOSIS — D696 Thrombocytopenia, unspecified: Secondary | ICD-10-CM | POA: Diagnosis not present

## 2021-09-21 DIAGNOSIS — N183 Chronic kidney disease, stage 3 unspecified: Secondary | ICD-10-CM | POA: Diagnosis not present

## 2021-09-21 DIAGNOSIS — E1122 Type 2 diabetes mellitus with diabetic chronic kidney disease: Secondary | ICD-10-CM | POA: Diagnosis not present

## 2021-09-21 DIAGNOSIS — Z833 Family history of diabetes mellitus: Secondary | ICD-10-CM | POA: Diagnosis not present

## 2021-09-21 DIAGNOSIS — Z8673 Personal history of transient ischemic attack (TIA), and cerebral infarction without residual deficits: Secondary | ICD-10-CM

## 2021-09-21 DIAGNOSIS — N179 Acute kidney failure, unspecified: Secondary | ICD-10-CM | POA: Diagnosis not present

## 2021-09-21 DIAGNOSIS — Z7982 Long term (current) use of aspirin: Secondary | ICD-10-CM

## 2021-09-21 DIAGNOSIS — Z794 Long term (current) use of insulin: Secondary | ICD-10-CM | POA: Diagnosis not present

## 2021-09-21 DIAGNOSIS — J90 Pleural effusion, not elsewhere classified: Secondary | ICD-10-CM | POA: Diagnosis not present

## 2021-09-21 DIAGNOSIS — D72829 Elevated white blood cell count, unspecified: Secondary | ICD-10-CM | POA: Diagnosis not present

## 2021-09-21 DIAGNOSIS — Z955 Presence of coronary angioplasty implant and graft: Secondary | ICD-10-CM

## 2021-09-21 DIAGNOSIS — E785 Hyperlipidemia, unspecified: Secondary | ICD-10-CM | POA: Diagnosis present

## 2021-09-21 DIAGNOSIS — I083 Combined rheumatic disorders of mitral, aortic and tricuspid valves: Secondary | ICD-10-CM | POA: Diagnosis not present

## 2021-09-21 DIAGNOSIS — R0902 Hypoxemia: Secondary | ICD-10-CM | POA: Diagnosis not present

## 2021-09-21 DIAGNOSIS — I255 Ischemic cardiomyopathy: Secondary | ICD-10-CM | POA: Diagnosis present

## 2021-09-21 DIAGNOSIS — I493 Ventricular premature depolarization: Secondary | ICD-10-CM | POA: Diagnosis not present

## 2021-09-21 DIAGNOSIS — I6523 Occlusion and stenosis of bilateral carotid arteries: Secondary | ICD-10-CM | POA: Diagnosis not present

## 2021-09-21 DIAGNOSIS — I252 Old myocardial infarction: Secondary | ICD-10-CM

## 2021-09-21 DIAGNOSIS — N189 Chronic kidney disease, unspecified: Secondary | ICD-10-CM | POA: Diagnosis not present

## 2021-09-21 DIAGNOSIS — D62 Acute posthemorrhagic anemia: Secondary | ICD-10-CM | POA: Diagnosis not present

## 2021-09-21 HISTORY — PX: CORONARY ARTERY BYPASS GRAFT: SHX141

## 2021-09-21 HISTORY — PX: TEE WITHOUT CARDIOVERSION: SHX5443

## 2021-09-21 HISTORY — PX: ENDOVEIN HARVEST OF GREATER SAPHENOUS VEIN: SHX5059

## 2021-09-21 LAB — POCT I-STAT 7, (LYTES, BLD GAS, ICA,H+H)
Acid-Base Excess: 3 mmol/L — ABNORMAL HIGH (ref 0.0–2.0)
Acid-Base Excess: 2 mmol/L (ref 0.0–2.0)
Acid-Base Excess: 4 mmol/L — ABNORMAL HIGH (ref 0.0–2.0)
Acid-Base Excess: 2 mmol/L (ref 0.0–2.0)
Acid-Base Excess: 0 mmol/L (ref 0.0–2.0)
Acid-base deficit: 5 mmol/L — ABNORMAL HIGH (ref 0.0–2.0)
Acid-base deficit: 5 mmol/L — ABNORMAL HIGH (ref 0.0–2.0)
Acid-base deficit: 6 mmol/L — ABNORMAL HIGH (ref 0.0–2.0)
Acid-base deficit: 3 mmol/L — ABNORMAL HIGH (ref 0.0–2.0)
Bicarbonate: 28.3 mmol/L — ABNORMAL HIGH (ref 20.0–28.0)
Bicarbonate: 25.6 mmol/L (ref 20.0–28.0)
Bicarbonate: 27.1 mmol/L (ref 20.0–28.0)
Bicarbonate: 26.5 mmol/L (ref 20.0–28.0)
Bicarbonate: 24.8 mmol/L (ref 20.0–28.0)
Bicarbonate: 21.8 mmol/L (ref 20.0–28.0)
Bicarbonate: 21.3 mmol/L (ref 20.0–28.0)
Bicarbonate: 19.5 mmol/L — ABNORMAL LOW (ref 20.0–28.0)
Bicarbonate: 22.7 mmol/L (ref 20.0–28.0)
Calcium, Ion: 1.06 mmol/L — ABNORMAL LOW (ref 1.15–1.40)
Calcium, Ion: 1.01 mmol/L — ABNORMAL LOW (ref 1.15–1.40)
Calcium, Ion: 1.08 mmol/L — ABNORMAL LOW (ref 1.15–1.40)
Calcium, Ion: 1.35 mmol/L (ref 1.15–1.40)
Calcium, Ion: 1.23 mmol/L (ref 1.15–1.40)
Calcium, Ion: 1.15 mmol/L (ref 1.15–1.40)
Calcium, Ion: 1.16 mmol/L (ref 1.15–1.40)
Calcium, Ion: 1.12 mmol/L — ABNORMAL LOW (ref 1.15–1.40)
Calcium, Ion: 1.09 mmol/L — ABNORMAL LOW (ref 1.15–1.40)
HCT: 28 % — ABNORMAL LOW (ref 39.0–52.0)
HCT: 24 % — ABNORMAL LOW (ref 39.0–52.0)
HCT: 29 % — ABNORMAL LOW (ref 39.0–52.0)
HCT: 26 % — ABNORMAL LOW (ref 39.0–52.0)
HCT: 33 % — ABNORMAL LOW (ref 39.0–52.0)
HCT: 34 % — ABNORMAL LOW (ref 39.0–52.0)
HCT: 33 % — ABNORMAL LOW (ref 39.0–52.0)
HCT: 31 % — ABNORMAL LOW (ref 39.0–52.0)
HCT: 29 % — ABNORMAL LOW (ref 39.0–52.0)
Hemoglobin: 9.5 g/dL — ABNORMAL LOW (ref 13.0–17.0)
Hemoglobin: 8.2 g/dL — ABNORMAL LOW (ref 13.0–17.0)
Hemoglobin: 9.9 g/dL — ABNORMAL LOW (ref 13.0–17.0)
Hemoglobin: 8.8 g/dL — ABNORMAL LOW (ref 13.0–17.0)
Hemoglobin: 11.2 g/dL — ABNORMAL LOW (ref 13.0–17.0)
Hemoglobin: 11.6 g/dL — ABNORMAL LOW (ref 13.0–17.0)
Hemoglobin: 11.2 g/dL — ABNORMAL LOW (ref 13.0–17.0)
Hemoglobin: 10.5 g/dL — ABNORMAL LOW (ref 13.0–17.0)
Hemoglobin: 9.9 g/dL — ABNORMAL LOW (ref 13.0–17.0)
O2 Saturation: 100 %
O2 Saturation: 100 %
O2 Saturation: 100 %
O2 Saturation: 100 %
O2 Saturation: 99 %
O2 Saturation: 96 %
O2 Saturation: 98 %
O2 Saturation: 98 %
O2 Saturation: 98 %
Patient temperature: 97.6
Patient temperature: 97.3
Patient temperature: 97.4
Patient temperature: 36.7
Patient temperature: 37.1
Potassium: 3.7 mmol/L (ref 3.5–5.1)
Potassium: 4.5 mmol/L (ref 3.5–5.1)
Potassium: 4.9 mmol/L (ref 3.5–5.1)
Potassium: 4.3 mmol/L (ref 3.5–5.1)
Potassium: 3.7 mmol/L (ref 3.5–5.1)
Potassium: 4.2 mmol/L (ref 3.5–5.1)
Potassium: 4.1 mmol/L (ref 3.5–5.1)
Potassium: 3.7 mmol/L (ref 3.5–5.1)
Potassium: 3.7 mmol/L (ref 3.5–5.1)
Sodium: 142 mmol/L (ref 135–145)
Sodium: 137 mmol/L (ref 135–145)
Sodium: 139 mmol/L (ref 135–145)
Sodium: 139 mmol/L (ref 135–145)
Sodium: 141 mmol/L (ref 135–145)
Sodium: 142 mmol/L (ref 135–145)
Sodium: 141 mmol/L (ref 135–145)
Sodium: 142 mmol/L (ref 135–145)
Sodium: 142 mmol/L (ref 135–145)
TCO2: 30 mmol/L (ref 22–32)
TCO2: 27 mmol/L (ref 22–32)
TCO2: 28 mmol/L (ref 22–32)
TCO2: 28 mmol/L (ref 22–32)
TCO2: 26 mmol/L (ref 22–32)
TCO2: 23 mmol/L (ref 22–32)
TCO2: 23 mmol/L (ref 22–32)
TCO2: 21 mmol/L — ABNORMAL LOW (ref 22–32)
TCO2: 24 mmol/L (ref 22–32)
pCO2 arterial: 43.8 mmHg (ref 32.0–48.0)
pCO2 arterial: 35 mmHg (ref 32.0–48.0)
pCO2 arterial: 35 mmHg (ref 32.0–48.0)
pCO2 arterial: 38.8 mmHg (ref 32.0–48.0)
pCO2 arterial: 40.3 mmHg (ref 32.0–48.0)
pCO2 arterial: 43.3 mmHg (ref 32.0–48.0)
pCO2 arterial: 42.5 mmHg (ref 32.0–48.0)
pCO2 arterial: 35.7 mmHg (ref 32.0–48.0)
pCO2 arterial: 40.6 mmHg (ref 32.0–48.0)
pH, Arterial: 7.419 (ref 7.350–7.450)
pH, Arterial: 7.472 — ABNORMAL HIGH (ref 7.350–7.450)
pH, Arterial: 7.497 — ABNORMAL HIGH (ref 7.350–7.450)
pH, Arterial: 7.442 (ref 7.350–7.450)
pH, Arterial: 7.394 (ref 7.350–7.450)
pH, Arterial: 7.307 — ABNORMAL LOW (ref 7.350–7.450)
pH, Arterial: 7.304 — ABNORMAL LOW (ref 7.350–7.450)
pH, Arterial: 7.343 — ABNORMAL LOW (ref 7.350–7.450)
pH, Arterial: 7.356 (ref 7.350–7.450)
pO2, Arterial: 376 mmHg — ABNORMAL HIGH (ref 83.0–108.0)
pO2, Arterial: 316 mmHg — ABNORMAL HIGH (ref 83.0–108.0)
pO2, Arterial: 318 mmHg — ABNORMAL HIGH (ref 83.0–108.0)
pO2, Arterial: 378 mmHg — ABNORMAL HIGH (ref 83.0–108.0)
pO2, Arterial: 130 mmHg — ABNORMAL HIGH (ref 83.0–108.0)
pO2, Arterial: 89 mmHg (ref 83.0–108.0)
pO2, Arterial: 106 mmHg (ref 83.0–108.0)
pO2, Arterial: 106 mmHg (ref 83.0–108.0)
pO2, Arterial: 102 mmHg (ref 83.0–108.0)

## 2021-09-21 LAB — CBC
HCT: 34 % — ABNORMAL LOW (ref 39.0–52.0)
HCT: 35.9 % — ABNORMAL LOW (ref 39.0–52.0)
Hemoglobin: 11.1 g/dL — ABNORMAL LOW (ref 13.0–17.0)
Hemoglobin: 11.7 g/dL — ABNORMAL LOW (ref 13.0–17.0)
MCH: 28.8 pg (ref 26.0–34.0)
MCH: 28.9 pg (ref 26.0–34.0)
MCHC: 32.6 g/dL (ref 30.0–36.0)
MCHC: 32.6 g/dL (ref 30.0–36.0)
MCV: 88.3 fL (ref 80.0–100.0)
MCV: 88.6 fL (ref 80.0–100.0)
Platelets: 78 10*3/uL — ABNORMAL LOW (ref 150–400)
Platelets: 97 10*3/uL — ABNORMAL LOW (ref 150–400)
RBC: 3.85 MIL/uL — ABNORMAL LOW (ref 4.22–5.81)
RBC: 4.05 MIL/uL — ABNORMAL LOW (ref 4.22–5.81)
RDW: 12.3 % (ref 11.5–15.5)
RDW: 12.6 % (ref 11.5–15.5)
WBC: 7 10*3/uL (ref 4.0–10.5)
WBC: 13.6 10*3/uL — ABNORMAL HIGH (ref 4.0–10.5)
nRBC: 0 % (ref 0.0–0.2)
nRBC: 0 % (ref 0.0–0.2)

## 2021-09-21 LAB — GLUCOSE, CAPILLARY
Glucose-Capillary: 88 mg/dL (ref 70–99)
Glucose-Capillary: 94 mg/dL (ref 70–99)
Glucose-Capillary: 101 mg/dL — ABNORMAL HIGH (ref 70–99)
Glucose-Capillary: 100 mg/dL — ABNORMAL HIGH (ref 70–99)
Glucose-Capillary: 90 mg/dL (ref 70–99)
Glucose-Capillary: 113 mg/dL — ABNORMAL HIGH (ref 70–99)
Glucose-Capillary: 127 mg/dL — ABNORMAL HIGH (ref 70–99)
Glucose-Capillary: 132 mg/dL — ABNORMAL HIGH (ref 70–99)
Glucose-Capillary: 170 mg/dL — ABNORMAL HIGH (ref 70–99)
Glucose-Capillary: 160 mg/dL — ABNORMAL HIGH (ref 70–99)
Glucose-Capillary: 180 mg/dL — ABNORMAL HIGH (ref 70–99)
Glucose-Capillary: 165 mg/dL — ABNORMAL HIGH (ref 70–99)

## 2021-09-21 LAB — BASIC METABOLIC PANEL
Anion gap: 8 (ref 5–15)
BUN: 16 mg/dL (ref 8–23)
CO2: 21 mmol/L — ABNORMAL LOW (ref 22–32)
Calcium: 8.3 mg/dL — ABNORMAL LOW (ref 8.9–10.3)
Chloride: 109 mmol/L (ref 98–111)
Creatinine, Ser: 1.3 mg/dL — ABNORMAL HIGH (ref 0.61–1.24)
GFR, Estimated: 59 mL/min — ABNORMAL LOW (ref >60)
Glucose, Bld: 133 mg/dL — ABNORMAL HIGH (ref 70–99)
Potassium: 4.5 mmol/L (ref 3.5–5.1)
Sodium: 138 mmol/L (ref 135–145)

## 2021-09-21 LAB — POCT I-STAT EG7
Acid-Base Excess: 2 mmol/L (ref 0.0–2.0)
Bicarbonate: 26.5 mmol/L (ref 20.0–28.0)
Calcium, Ion: 1.08 mmol/L — ABNORMAL LOW (ref 1.15–1.40)
HCT: 28 % — ABNORMAL LOW (ref 39.0–52.0)
Hemoglobin: 9.5 g/dL — ABNORMAL LOW (ref 13.0–17.0)
O2 Saturation: 83 %
Potassium: 5 mmol/L (ref 3.5–5.1)
Sodium: 140 mmol/L (ref 135–145)
TCO2: 28 mmol/L (ref 22–32)
pCO2, Ven: 43 mmHg — ABNORMAL LOW (ref 44.0–60.0)
pH, Ven: 7.399 (ref 7.250–7.430)
pO2, Ven: 48 mmHg — ABNORMAL HIGH (ref 32.0–45.0)

## 2021-09-21 LAB — POCT I-STAT, CHEM 8
BUN: 18 mg/dL (ref 8–23)
BUN: 17 mg/dL (ref 8–23)
BUN: 18 mg/dL (ref 8–23)
BUN: 17 mg/dL (ref 8–23)
Calcium, Ion: 1.26 mmol/L (ref 1.15–1.40)
Calcium, Ion: 1.21 mmol/L (ref 1.15–1.40)
Calcium, Ion: 1.04 mmol/L — ABNORMAL LOW (ref 1.15–1.40)
Calcium, Ion: 1.32 mmol/L (ref 1.15–1.40)
Chloride: 104 mmol/L (ref 98–111)
Chloride: 106 mmol/L (ref 98–111)
Chloride: 103 mmol/L (ref 98–111)
Chloride: 104 mmol/L (ref 98–111)
Creatinine, Ser: 1.1 mg/dL (ref 0.61–1.24)
Creatinine, Ser: 1 mg/dL (ref 0.61–1.24)
Creatinine, Ser: 1 mg/dL (ref 0.61–1.24)
Creatinine, Ser: 0.9 mg/dL (ref 0.61–1.24)
Glucose, Bld: 87 mg/dL (ref 70–99)
Glucose, Bld: 87 mg/dL (ref 70–99)
Glucose, Bld: 88 mg/dL (ref 70–99)
Glucose, Bld: 84 mg/dL (ref 70–99)
HCT: 38 % — ABNORMAL LOW (ref 39.0–52.0)
HCT: 36 % — ABNORMAL LOW (ref 39.0–52.0)
HCT: 31 % — ABNORMAL LOW (ref 39.0–52.0)
HCT: 28 % — ABNORMAL LOW (ref 39.0–52.0)
Hemoglobin: 12.9 g/dL — ABNORMAL LOW (ref 13.0–17.0)
Hemoglobin: 12.2 g/dL — ABNORMAL LOW (ref 13.0–17.0)
Hemoglobin: 10.5 g/dL — ABNORMAL LOW (ref 13.0–17.0)
Hemoglobin: 9.5 g/dL — ABNORMAL LOW (ref 13.0–17.0)
Potassium: 3.6 mmol/L (ref 3.5–5.1)
Potassium: 3.6 mmol/L (ref 3.5–5.1)
Potassium: 4.5 mmol/L (ref 3.5–5.1)
Potassium: 4.3 mmol/L (ref 3.5–5.1)
Sodium: 141 mmol/L (ref 135–145)
Sodium: 142 mmol/L (ref 135–145)
Sodium: 139 mmol/L (ref 135–145)
Sodium: 139 mmol/L (ref 135–145)
TCO2: 26 mmol/L (ref 22–32)
TCO2: 26 mmol/L (ref 22–32)
TCO2: 26 mmol/L (ref 22–32)
TCO2: 26 mmol/L (ref 22–32)

## 2021-09-21 LAB — MAGNESIUM: Magnesium: 3.3 mg/dL — ABNORMAL HIGH (ref 1.7–2.4)

## 2021-09-21 LAB — ECHO INTRAOPERATIVE TEE
AV Mean grad: 2 mmHg
AV Peak grad: 3.9 mmHg
Ao pk vel: 0.99 m/s
Height: 65 in
Weight: 2560 oz

## 2021-09-21 LAB — HEMOGLOBIN AND HEMATOCRIT, BLOOD
HCT: 26.9 % — ABNORMAL LOW (ref 39.0–52.0)
Hemoglobin: 9.2 g/dL — ABNORMAL LOW (ref 13.0–17.0)

## 2021-09-21 LAB — PROTIME-INR
INR: 1.4 — ABNORMAL HIGH (ref 0.8–1.2)
Prothrombin Time: 17.2 seconds — ABNORMAL HIGH (ref 11.4–15.2)

## 2021-09-21 LAB — PLATELET COUNT: Platelets: 88 10*3/uL — ABNORMAL LOW (ref 150–400)

## 2021-09-21 LAB — ABO/RH: ABO/RH(D): O POS

## 2021-09-21 LAB — APTT: aPTT: 39 seconds — ABNORMAL HIGH (ref 24–36)

## 2021-09-21 SURGERY — CORONARY ARTERY BYPASS GRAFTING (CABG)
Anesthesia: General | Site: Chest

## 2021-09-21 MED ORDER — NITROGLYCERIN IN D5W 200-5 MCG/ML-% IV SOLN: 0.0000 ug/min | INTRAVENOUS | Status: DC

## 2021-09-21 MED ORDER — MIDAZOLAM HCL (PF) 10 MG/2ML IJ SOLN
INTRAMUSCULAR | Status: AC
  Filled 2021-09-21: qty 2

## 2021-09-21 MED ORDER — FENTANYL CITRATE (PF) 250 MCG/5ML IJ SOLN
INTRAMUSCULAR | Status: AC
  Filled 2021-09-21: qty 5

## 2021-09-21 MED ORDER — INSULIN REGULAR(HUMAN) IN NACL 100-0.9 UT/100ML-% IV SOLN: INTRAVENOUS | Status: DC

## 2021-09-21 MED ORDER — 0.9 % SODIUM CHLORIDE (POUR BTL) OPTIME
TOPICAL | Status: DC | PRN
  Administered 2021-09-21: 09:00:00 5000 mL

## 2021-09-21 MED ORDER — LACTATED RINGERS IV SOLN
INTRAVENOUS | Status: DC | PRN
Start: 2021-09-21 — End: 2021-09-21

## 2021-09-21 MED ORDER — MIDAZOLAM HCL 2 MG/2ML IJ SOLN: 2.0000 mg | INTRAMUSCULAR | Status: DC | PRN

## 2021-09-21 MED ORDER — HEPARIN SODIUM (PORCINE) 1000 UNIT/ML IJ SOLN
INTRAMUSCULAR | Status: DC | PRN
  Administered 2021-09-21: 22000 [IU] via INTRAVENOUS

## 2021-09-21 MED ORDER — DOBUTAMINE IN D5W 4-5 MG/ML-% IV SOLN: 0.0000 ug/kg/min | INTRAVENOUS | Status: DC

## 2021-09-21 MED ORDER — ROCURONIUM BROMIDE 10 MG/ML (PF) SYRINGE
PREFILLED_SYRINGE | INTRAVENOUS | Status: DC | PRN
  Administered 2021-09-21: 50 mg via INTRAVENOUS
  Administered 2021-09-21: 30 mg via INTRAVENOUS
  Administered 2021-09-21: 100 mg via INTRAVENOUS

## 2021-09-21 MED ORDER — SODIUM CHLORIDE 0.9% FLUSH
3.0000 mL | Freq: Two times a day (BID) | INTRAVENOUS | Status: DC
  Administered 2021-09-22 – 2021-09-25 (×3): 3 mL via INTRAVENOUS

## 2021-09-21 MED ORDER — LACTATED RINGERS IV SOLN: INTRAVENOUS | Status: DC

## 2021-09-21 MED ORDER — METOPROLOL TARTRATE 12.5 MG HALF TABLET: 12.5000 mg | ORAL_TABLET | Freq: Two times a day (BID) | ORAL | Status: DC

## 2021-09-21 MED ORDER — METOPROLOL TARTRATE 5 MG/5ML IV SOLN: 2.5000 mg | INTRAVENOUS | Status: DC | PRN

## 2021-09-21 MED ORDER — SODIUM CHLORIDE 0.9 % IV SOLN: 250.0000 mL | INTRAVENOUS | Status: DC

## 2021-09-21 MED ORDER — METOPROLOL TARTRATE 12.5 MG HALF TABLET: 12.5000 mg | ORAL_TABLET | Freq: Once | ORAL | Status: DC

## 2021-09-21 MED ORDER — ACETAMINOPHEN 650 MG RE SUPP
650.0000 mg | Freq: Once | RECTAL | Status: AC
  Administered 2021-09-21: 13:00:00 650 mg via RECTAL

## 2021-09-21 MED ORDER — METOPROLOL TARTRATE 12.5 MG HALF TABLET
ORAL_TABLET | ORAL | Status: AC
  Filled 2021-09-21: qty 1

## 2021-09-21 MED ORDER — SODIUM CHLORIDE 0.9 % IV SOLN: INTRAVENOUS | Status: DC

## 2021-09-21 MED ORDER — ASPIRIN EC 325 MG PO TBEC
325.0000 mg | DELAYED_RELEASE_TABLET | Freq: Every day | ORAL | Status: DC
  Administered 2021-09-22 – 2021-09-23 (×2): 325 mg via ORAL
  Filled 2021-09-21 (×2): qty 1

## 2021-09-21 MED ORDER — PHENYLEPHRINE HCL-NACL 20-0.9 MG/250ML-% IV SOLN: 0.0000 ug/min | INTRAVENOUS | Status: DC

## 2021-09-21 MED ORDER — ACETAMINOPHEN 500 MG PO TABS
1000.0000 mg | ORAL_TABLET | Freq: Four times a day (QID) | ORAL | Status: DC
  Administered 2021-09-21 – 2021-09-23 (×8): 1000 mg via ORAL
  Filled 2021-09-21 (×7): qty 2

## 2021-09-21 MED ORDER — ASPIRIN 81 MG PO CHEW
324.0000 mg | CHEWABLE_TABLET | Freq: Every day | ORAL | Status: DC
  Filled 2021-09-21: qty 4

## 2021-09-21 MED ORDER — SODIUM CHLORIDE 0.9 % IV SOLN: INTRAVENOUS | Status: DC | PRN

## 2021-09-21 MED ORDER — NOREPINEPHRINE 4 MG/250ML-% IV SOLN: 0.0000 ug/min | INTRAVENOUS | Status: DC

## 2021-09-21 MED ORDER — PROTAMINE SULFATE 10 MG/ML IV SOLN
INTRAVENOUS | Status: DC | PRN
  Administered 2021-09-21: 220 mg via INTRAVENOUS

## 2021-09-21 MED ORDER — DEXMEDETOMIDINE HCL IN NACL 400 MCG/100ML IV SOLN: 0.0000 ug/kg/h | INTRAVENOUS | Status: DC

## 2021-09-21 MED ORDER — CHLORHEXIDINE GLUCONATE 0.12 % MT SOLN: 15.0000 mL | Freq: Once | OROMUCOSAL | Status: DC

## 2021-09-21 MED ORDER — CHLORHEXIDINE GLUCONATE CLOTH 2 % EX PADS
6.0000 | MEDICATED_PAD | Freq: Every day | CUTANEOUS | Status: DC
  Administered 2021-09-22 – 2021-09-27 (×6): 6 via TOPICAL

## 2021-09-21 MED ORDER — SODIUM BICARBONATE 8.4 % IV SOLN
50.0000 meq | Freq: Once | INTRAVENOUS | Status: AC
  Administered 2021-09-21: 21:00:00 50 meq via INTRAVENOUS

## 2021-09-21 MED ORDER — ONDANSETRON HCL 4 MG/2ML IJ SOLN
INTRAMUSCULAR | Status: AC
  Filled 2021-09-21: qty 2

## 2021-09-21 MED ORDER — LACTATED RINGERS IV SOLN: 500.0000 mL | Freq: Once | INTRAVENOUS | Status: DC | PRN

## 2021-09-21 MED ORDER — PROTAMINE SULFATE 10 MG/ML IV SOLN
INTRAVENOUS | Status: AC
  Filled 2021-09-21: qty 25

## 2021-09-21 MED ORDER — MAGNESIUM SULFATE 4 GM/100ML IV SOLN
4.0000 g | Freq: Once | INTRAVENOUS | Status: AC
  Administered 2021-09-21: 13:00:00 4 g via INTRAVENOUS
  Filled 2021-09-21: qty 100

## 2021-09-21 MED ORDER — DEXAMETHASONE SODIUM PHOSPHATE 10 MG/ML IJ SOLN
INTRAMUSCULAR | Status: AC
  Filled 2021-09-21: qty 1

## 2021-09-21 MED ORDER — PROPOFOL 10 MG/ML IV BOLUS
INTRAVENOUS | Status: DC | PRN
  Administered 2021-09-21: 40 mg via INTRAVENOUS

## 2021-09-21 MED ORDER — TRAMADOL HCL 50 MG PO TABS
50.0000 mg | ORAL_TABLET | ORAL | Status: DC | PRN
  Administered 2021-09-21: 100 mg via ORAL
  Filled 2021-09-21: qty 2

## 2021-09-21 MED ORDER — PANTOPRAZOLE SODIUM 40 MG PO TBEC
40.0000 mg | DELAYED_RELEASE_TABLET | Freq: Every day | ORAL | Status: DC
  Administered 2021-09-23: 10:00:00 40 mg via ORAL
  Filled 2021-09-21: qty 1

## 2021-09-21 MED ORDER — CHLORHEXIDINE GLUCONATE 0.12 % MT SOLN
15.0000 mL | OROMUCOSAL | Status: AC
  Administered 2021-09-21: 13:00:00 15 mL via OROMUCOSAL

## 2021-09-21 MED ORDER — PHENYLEPHRINE 40 MCG/ML (10ML) SYRINGE FOR IV PUSH (FOR BLOOD PRESSURE SUPPORT)
PREFILLED_SYRINGE | INTRAVENOUS | Status: AC
  Filled 2021-09-21: qty 10

## 2021-09-21 MED ORDER — MORPHINE SULFATE (PF) 2 MG/ML IV SOLN
1.0000 mg | INTRAVENOUS | Status: DC | PRN
  Administered 2021-09-21 – 2021-09-23 (×4): 2 mg via INTRAVENOUS
  Filled 2021-09-21 (×4): qty 1

## 2021-09-21 MED ORDER — SODIUM CHLORIDE 0.9% FLUSH: 3.0000 mL | INTRAVENOUS | Status: DC | PRN

## 2021-09-21 MED ORDER — PLASMA-LYTE A IV SOLN
INTRAVENOUS | Status: DC | PRN
  Administered 2021-09-21: 09:00:00 1000 mL

## 2021-09-21 MED ORDER — ARTIFICIAL TEARS OPHTHALMIC OINT
TOPICAL_OINTMENT | OPHTHALMIC | Status: DC | PRN
  Administered 2021-09-21: 1 via OPHTHALMIC

## 2021-09-21 MED ORDER — METOPROLOL TARTRATE 25 MG/10 ML ORAL SUSPENSION: 12.5000 mg | Freq: Two times a day (BID) | ORAL | Status: DC

## 2021-09-21 MED ORDER — BISACODYL 5 MG PO TBEC
10.0000 mg | DELAYED_RELEASE_TABLET | Freq: Every day | ORAL | Status: DC
  Administered 2021-09-22 – 2021-09-23 (×2): 10 mg via ORAL
  Filled 2021-09-21 (×2): qty 2

## 2021-09-21 MED ORDER — ALBUMIN HUMAN 5 % IV SOLN
250.0000 mL | INTRAVENOUS | Status: AC | PRN
  Administered 2021-09-21 (×2): 12.5 g via INTRAVENOUS
  Filled 2021-09-21: qty 250

## 2021-09-21 MED ORDER — ~~LOC~~ CARDIAC SURGERY, PATIENT & FAMILY EDUCATION
Freq: Once | Status: DC
  Filled 2021-09-21: qty 1

## 2021-09-21 MED ORDER — DEXAMETHASONE SODIUM PHOSPHATE 10 MG/ML IJ SOLN
INTRAMUSCULAR | Status: DC | PRN
  Administered 2021-09-21: 5 mg via INTRAVENOUS

## 2021-09-21 MED ORDER — DOCUSATE SODIUM 100 MG PO CAPS
200.0000 mg | ORAL_CAPSULE | Freq: Every day | ORAL | Status: DC
  Administered 2021-09-22 – 2021-09-23 (×2): 200 mg via ORAL
  Filled 2021-09-21 (×3): qty 2

## 2021-09-21 MED ORDER — FAMOTIDINE IN NACL 20-0.9 MG/50ML-% IV SOLN
20.0000 mg | Freq: Two times a day (BID) | INTRAVENOUS | Status: AC
  Administered 2021-09-21: 13:00:00 20 mg via INTRAVENOUS
  Filled 2021-09-21: qty 50

## 2021-09-21 MED ORDER — CHLORHEXIDINE GLUCONATE 4 % EX LIQD: 30.0000 mL | CUTANEOUS | Status: DC

## 2021-09-21 MED ORDER — ALBUMIN HUMAN 5 % IV SOLN: INTRAVENOUS | Status: DC | PRN

## 2021-09-21 MED ORDER — VANCOMYCIN HCL IN DEXTROSE 1-5 GM/200ML-% IV SOLN
1000.0000 mg | Freq: Once | INTRAVENOUS | Status: AC
  Administered 2021-09-21: 19:00:00 1000 mg via INTRAVENOUS
  Filled 2021-09-21: qty 200

## 2021-09-21 MED ORDER — HEPARIN SODIUM (PORCINE) 1000 UNIT/ML IJ SOLN
INTRAMUSCULAR | Status: AC
  Filled 2021-09-21: qty 1

## 2021-09-21 MED ORDER — CEFAZOLIN SODIUM-DEXTROSE 2-4 GM/100ML-% IV SOLN
2.0000 g | Freq: Three times a day (TID) | INTRAVENOUS | Status: AC
  Administered 2021-09-21 – 2021-09-23 (×6): 2 g via INTRAVENOUS
  Filled 2021-09-21 (×6): qty 100

## 2021-09-21 MED ORDER — MIDAZOLAM HCL (PF) 5 MG/ML IJ SOLN
INTRAMUSCULAR | Status: DC | PRN
  Administered 2021-09-21 (×2): 2 mg via INTRAVENOUS
  Administered 2021-09-21: 3 mg via INTRAVENOUS

## 2021-09-21 MED ORDER — ACETAMINOPHEN 160 MG/5ML PO SOLN: 1000.0000 mg | Freq: Four times a day (QID) | ORAL | Status: DC

## 2021-09-21 MED ORDER — ONDANSETRON HCL 4 MG/2ML IJ SOLN
4.0000 mg | Freq: Four times a day (QID) | INTRAMUSCULAR | Status: DC | PRN
  Administered 2021-09-23 – 2021-09-24 (×4): 4 mg via INTRAVENOUS
  Filled 2021-09-21 (×4): qty 2

## 2021-09-21 MED ORDER — SODIUM CHLORIDE 0.45 % IV SOLN: INTRAVENOUS | Status: DC | PRN

## 2021-09-21 MED ORDER — ROCURONIUM BROMIDE 10 MG/ML (PF) SYRINGE
PREFILLED_SYRINGE | INTRAVENOUS | Status: AC
  Filled 2021-09-21: qty 20

## 2021-09-21 MED ORDER — POTASSIUM CHLORIDE 10 MEQ/50ML IV SOLN
10.0000 meq | INTRAVENOUS | Status: AC
  Administered 2021-09-21 (×3): 10 meq via INTRAVENOUS

## 2021-09-21 MED ORDER — FENTANYL CITRATE (PF) 250 MCG/5ML IJ SOLN
INTRAMUSCULAR | Status: DC | PRN
  Administered 2021-09-21: 150 ug via INTRAVENOUS
  Administered 2021-09-21 (×3): 50 ug via INTRAVENOUS
  Administered 2021-09-21: 200 ug via INTRAVENOUS
  Administered 2021-09-21: 50 ug via INTRAVENOUS
  Administered 2021-09-21 (×2): 100 ug via INTRAVENOUS
  Administered 2021-09-21: 250 ug via INTRAVENOUS

## 2021-09-21 MED ORDER — NICARDIPINE HCL IN NACL 20-0.86 MG/200ML-% IV SOLN
0.0000 mg/h | INTRAVENOUS | Status: DC
  Administered 2021-09-21: 16:00:00 5 mg/h via INTRAVENOUS
  Filled 2021-09-21: qty 200

## 2021-09-21 MED ORDER — ALBUMIN HUMAN 5 % IV SOLN
25.0000 g | Freq: Once | INTRAVENOUS | Status: AC
  Administered 2021-09-21: 21:00:00 25 g via INTRAVENOUS

## 2021-09-21 MED ORDER — BISACODYL 10 MG RE SUPP
10.0000 mg | Freq: Every day | RECTAL | Status: DC
  Administered 2021-09-28: 10 mg via RECTAL
  Filled 2021-09-21 (×3): qty 1

## 2021-09-21 MED ORDER — ONDANSETRON HCL 4 MG/2ML IJ SOLN
INTRAMUSCULAR | Status: DC | PRN
  Administered 2021-09-21: 4 mg via INTRAVENOUS

## 2021-09-21 MED ORDER — PHENYLEPHRINE 40 MCG/ML (10ML) SYRINGE FOR IV PUSH (FOR BLOOD PRESSURE SUPPORT)
PREFILLED_SYRINGE | INTRAVENOUS | Status: DC | PRN
  Administered 2021-09-21: 40 ug via INTRAVENOUS
  Administered 2021-09-21: 60 ug via INTRAVENOUS
  Administered 2021-09-21 (×2): 40 ug via INTRAVENOUS

## 2021-09-21 MED ORDER — OXYCODONE HCL 5 MG PO TABS
5.0000 mg | ORAL_TABLET | ORAL | Status: DC | PRN
  Administered 2021-09-22 (×4): 10 mg via ORAL
  Administered 2021-09-25: 5 mg via ORAL
  Filled 2021-09-21 (×5): qty 2
  Filled 2021-09-21: qty 1

## 2021-09-21 MED ORDER — DEXTROSE 50 % IV SOLN: 0.0000 mL | INTRAVENOUS | Status: DC | PRN

## 2021-09-21 MED ORDER — CHLORHEXIDINE GLUCONATE 0.12 % MT SOLN
OROMUCOSAL | Status: AC
  Filled 2021-09-21: qty 15

## 2021-09-21 MED ORDER — SODIUM CHLORIDE (PF) 0.9 % IJ SOLN
OROMUCOSAL | Status: DC | PRN
  Administered 2021-09-21 (×2): 4 mL via TOPICAL

## 2021-09-21 MED ORDER — PROPOFOL 10 MG/ML IV BOLUS
INTRAVENOUS | Status: AC
  Filled 2021-09-21: qty 20

## 2021-09-21 MED ORDER — ACETAMINOPHEN 160 MG/5ML PO SOLN: 650.0000 mg | Freq: Once | ORAL | Status: AC

## 2021-09-21 MED ORDER — ROSUVASTATIN CALCIUM 20 MG PO TABS
40.0000 mg | ORAL_TABLET | Freq: Every day | ORAL | Status: DC
  Administered 2021-09-22 – 2021-09-23 (×2): 40 mg via ORAL
  Filled 2021-09-21 (×2): qty 2

## 2021-09-21 SURGICAL SUPPLY — 87 items
BAG DECANTER FOR FLEXI CONT (MISCELLANEOUS) ×4 IMPLANT
BLADE CLIPPER SURG (BLADE) ×4 IMPLANT
BLADE STERNUM SYSTEM 6 (BLADE) ×4 IMPLANT
BNDG ELASTIC 4X5.8 VLCR STR LF (GAUZE/BANDAGES/DRESSINGS) ×4 IMPLANT
BNDG ELASTIC 6X5.8 VLCR STR LF (GAUZE/BANDAGES/DRESSINGS) ×4 IMPLANT
BNDG GAUZE ELAST 4 BULKY (GAUZE/BANDAGES/DRESSINGS) ×4 IMPLANT
CABLE SURGICAL S-101-97-12 (CABLE) ×4 IMPLANT
CANISTER SUCT 3000ML PPV (MISCELLANEOUS) ×4 IMPLANT
CANNULA MC2 2 STG 29/37 NON-V (CANNULA) ×3 IMPLANT
CANNULA MC2 TWO STAGE (CANNULA) ×4
CANNULA NON VENT 18FR 12 (CANNULA) ×1 IMPLANT
CANNULA NON VENT 20FR 12 (CANNULA) ×3 IMPLANT
CATH ROBINSON RED A/P 18FR (CATHETERS) ×8 IMPLANT
CLIP RETRACTION 3.0MM CORONARY (MISCELLANEOUS) ×4 IMPLANT
CLIP VESOCCLUDE MED 24/CT (CLIP) IMPLANT
CLIP VESOCCLUDE SM WIDE 24/CT (CLIP) IMPLANT
CONN ST 1/2X1/2  BEN (MISCELLANEOUS) ×4
CONN ST 1/2X1/2 BEN (MISCELLANEOUS) ×3 IMPLANT
CONNECTOR BLAKE 2:1 CARIO BLK (MISCELLANEOUS) ×4 IMPLANT
CONTAINER PROTECT SURGISLUSH (MISCELLANEOUS) ×8 IMPLANT
DERMABOND ADVANCED (GAUZE/BANDAGES/DRESSINGS) ×2
DERMABOND ADVANCED .7 DNX12 (GAUZE/BANDAGES/DRESSINGS) IMPLANT
DRAIN CHANNEL 19F RND (DRAIN) ×11 IMPLANT
DRAIN CONNECTOR BLAKE 1:1 (MISCELLANEOUS) ×3 IMPLANT
DRAPE CARDIOVASCULAR INCISE (DRAPES) ×4
DRAPE INCISE IOBAN 66X45 STRL (DRAPES) IMPLANT
DRAPE SRG 135X102X78XABS (DRAPES) ×3 IMPLANT
DRAPE WARM FLUID 44X44 (DRAPES) ×4 IMPLANT
DRSG AQUACEL AG ADV 3.5X10 (GAUZE/BANDAGES/DRESSINGS) ×4 IMPLANT
DRSG COVADERM 4X14 (GAUZE/BANDAGES/DRESSINGS) ×4 IMPLANT
ELECT BLADE 4.0 EZ CLEAN MEGAD (MISCELLANEOUS) ×4
ELECT REM PT RETURN 9FT ADLT (ELECTROSURGICAL) ×8
ELECTRODE BLDE 4.0 EZ CLN MEGD (MISCELLANEOUS) ×3 IMPLANT
ELECTRODE REM PT RTRN 9FT ADLT (ELECTROSURGICAL) ×6 IMPLANT
FELT TEFLON 1X6 (MISCELLANEOUS) ×7 IMPLANT
GAUZE SPONGE 4X4 12PLY STRL (GAUZE/BANDAGES/DRESSINGS) ×8 IMPLANT
GAUZE SPONGE 4X4 12PLY STRL LF (GAUZE/BANDAGES/DRESSINGS) ×2 IMPLANT
GLOVE SURG ENC MOIS LTX SZ7 (GLOVE) ×6 IMPLANT
GLOVE SURG ENC TEXT LTX SZ7.5 (GLOVE) ×8 IMPLANT
GLOVE SURG MICRO LTX SZ6.5 (GLOVE) ×10 IMPLANT
GLOVE SURG MICRO LTX SZ7.5 (GLOVE) ×3 IMPLANT
GOWN STRL REUS W/ TWL LRG LVL3 (GOWN DISPOSABLE) ×12 IMPLANT
GOWN STRL REUS W/ TWL XL LVL3 (GOWN DISPOSABLE) ×6 IMPLANT
GOWN STRL REUS W/TWL LRG LVL3 (GOWN DISPOSABLE) ×16
GOWN STRL REUS W/TWL XL LVL3 (GOWN DISPOSABLE) ×8
HEMOSTAT POWDER SURGIFOAM 1G (HEMOSTASIS) ×11 IMPLANT
INSERT SUTURE HOLDER (MISCELLANEOUS) ×4 IMPLANT
KIT BASIN OR (CUSTOM PROCEDURE TRAY) ×4 IMPLANT
KIT SUCTION CATH 14FR (SUCTIONS) ×4 IMPLANT
KIT TURNOVER KIT B (KITS) ×4 IMPLANT
KIT VASOVIEW HEMOPRO 2 VH 4000 (KITS) ×4 IMPLANT
LEAD PACING MYOCARDI (MISCELLANEOUS) ×4 IMPLANT
MARKER GRAFT CORONARY BYPASS (MISCELLANEOUS) ×3 IMPLANT
NS IRRIG 1000ML POUR BTL (IV SOLUTION) ×20 IMPLANT
PACK ACCESSORY CANNULA KIT (KITS) ×4 IMPLANT
PACK E OPEN HEART (SUTURE) ×4 IMPLANT
PACK OPEN HEART (CUSTOM PROCEDURE TRAY) ×4 IMPLANT
PAD ARMBOARD 7.5X6 YLW CONV (MISCELLANEOUS) ×8 IMPLANT
PAD ELECT DEFIB RADIOL ZOLL (MISCELLANEOUS) ×4 IMPLANT
PENCIL BUTTON HOLSTER BLD 10FT (ELECTRODE) ×4 IMPLANT
POSITIONER HEAD DONUT 9IN (MISCELLANEOUS) ×4 IMPLANT
PUNCH AORTIC ROTATE 4.0MM (MISCELLANEOUS) ×4 IMPLANT
SET MPS 3-ND DEL (MISCELLANEOUS) ×1 IMPLANT
SOL PREP POV-IOD 4OZ 10% (MISCELLANEOUS) ×2 IMPLANT
SOL PREP PROV IODINE SCRUB 4OZ (MISCELLANEOUS) ×2 IMPLANT
SUPPORT HEART JANKE-BARRON (MISCELLANEOUS) ×4 IMPLANT
SUT BONE WAX W31G (SUTURE) ×4 IMPLANT
SUT ETHIBOND X763 2 0 SH 1 (SUTURE) ×8 IMPLANT
SUT MNCRL AB 3-0 PS2 18 (SUTURE) ×8 IMPLANT
SUT PDS AB 1 CTX 36 (SUTURE) ×8 IMPLANT
SUT PROLENE 4 0 SH DA (SUTURE) ×4 IMPLANT
SUT PROLENE 5 0 C 1 36 (SUTURE) ×13 IMPLANT
SUT PROLENE 7 0 BV1 MDA (SUTURE) ×5 IMPLANT
SUT STEEL 6MS V (SUTURE) ×8 IMPLANT
SUT VIC AB 2-0 CT1 27 (SUTURE) ×4
SUT VIC AB 2-0 CT1 TAPERPNT 27 (SUTURE) IMPLANT
SUT VIC AB 3-0 X1 27 (SUTURE) ×1 IMPLANT
SYSTEM SAHARA CHEST DRAIN ATS (WOUND CARE) ×4 IMPLANT
TAPE CLOTH SURG 4X10 WHT LF (GAUZE/BANDAGES/DRESSINGS) ×2 IMPLANT
TAPE PAPER 2X10 WHT MICROPORE (GAUZE/BANDAGES/DRESSINGS) ×1 IMPLANT
TAPES RETRACTO (MISCELLANEOUS) ×1 IMPLANT
TOWEL GREEN STERILE (TOWEL DISPOSABLE) ×4 IMPLANT
TOWEL GREEN STERILE FF (TOWEL DISPOSABLE) ×4 IMPLANT
TRAY FOLEY SLVR 16FR TEMP STAT (SET/KITS/TRAYS/PACK) ×4 IMPLANT
TUBING LAP HI FLOW INSUFFLATIO (TUBING) ×4 IMPLANT
UNDERPAD 30X36 HEAVY ABSORB (UNDERPADS AND DIAPERS) ×4 IMPLANT
WATER STERILE IRR 1000ML POUR (IV SOLUTION) ×2 IMPLANT

## 2021-09-21 NOTE — Progress Notes (Signed)
°  Echocardiogram Echocardiogram Transesophageal has been performed.  Barry [name] 09/21/2021, 11:13 AM

## 2021-09-21 NOTE — Procedures (Deleted)
Extubation Procedure Note  Patient Details:   Name: Barry [name] DOB: 1951/06/17 MRN: [id number]   Airway Documentation:  Airway 8 mm (Active)  Secured at (cm) 21 cm 09/21/21 1909  Measured From Lips 09/21/21 1909  Secured Location Right 09/21/21 1909  Secured By Pink Tape 09/21/21 1909  Site Condition Dry 09/21/21 1909    Vent end date 09/21/2021 Vent end time 0930  Evaluation  O2 sats: stable throughout Complications: No apparent complications Patient did tolerate procedure well. Bilateral Breath Sounds: Clear, Diminished   Pt NIF of -28 VC 780. Extubated to 4L water bottle [place]. Pt able to speak name and verbalize pain. No stridor noted. RT will cont to monitor.   [place] G Desi Carby 09/21/2021, 9:30 PM

## 2021-09-21 NOTE — Op Note (Signed)
[address]       [place] [address]             [phone]                                          09/21/2021 Patient:  Barry [name] Pre-Op Dx: Coronary artery disease   Diabetes mellitus   Chronic renal insufficiency   Hypertension   Hyperlipidemia Post-op Dx: Same Procedure: CABG X 4.  LIMA to LAD.  Reverse saphenous vein graft to PDA, first diagonal, and OM. Endoscopic greater saphenous vein harvest on the right   Surgeon and Role:      * Lindzie Boxx, Lucile Crater, MD - Primary    Evonnie Pat, PA-C- assisting An experienced assistant was required given the complexity of this surgery and the standard of surgical care. The assistant was needed for exposure, dissection, suctioning, retraction of delicate tissues and sutures, instrument exchange and for overall help during this procedure.    Anesthesia  general EBL: 1000 ml Blood Administration: None Xclamp Time: 62 min Pump Time: 100 min  Drains: 19 F blake drain: L, mediastinal  Wires: Atrial Counts: correct   Indications: 70 year old male with severe three-vessel coronary artery disease.  I personally reviewed his left heart cath and his echocardiogram.  In regards to his echo his left heart function is mildly reduced with an EF of 40 to 45%.  His right heart function is also reduced slightly.  He has no significant valvular disease.  In regards to his left heart cath, he has good distal targets on all walls.  He does have a tight stenosis but the PDA appears a good target.  There is in-stent restenosis in his circumflex stents but there is a distal target and fills from left to left collaterals.  His LAD also has a tight proximal stenosis but has a good target distally.  The same is true for the diagonal.  Plan for CABG x4.  Findings: Good vein, good LIMA.  The PDA was a good size vessel.  The LAD was calcified but a good quality target was identified.  The obtuse marginal was large and good caliber.  The  diagonal vessel was small with adequate flows.  Operative Technique: All invasive lines were placed in pre-op holding.  After the risks, benefits and alternatives were thoroughly discussed, the patient was brought to the operative theatre.  Anesthesia was induced, and the patient was prepped and draped in normal sterile fashion.  An appropriate surgical pause was performed, and pre-operative antibiotics were dosed accordingly.  We began with simultaneous incisions along the right leg for harvesting of the greater saphenous vein and the chest for the sternotomy.  In regards to the sternotomy, this was carried down with bovie cautery, and the sternum was divided with a reciprocating saw.  Meticulous hemostasis was obtained.  The left internal thoracic artery was exposed and harvested in in pedicled fashion.  The patient was systemically heparinized, and the artery was divided distally, and placed in a papaverine sponge.    The sternal elevator was removed, and a retractor was placed.  The pericardium was divided in the midline and fashioned into a cradle with pericardial stitches.   After we confirmed an appropriate ACT, the ascending aorta was cannulated in standard fashion.  The right atrial appendage was used for venous cannulation  site.  Cardiopulmonary bypass was initiated, and the heart retractor was placed. The cross clamp was applied, and a dose of anterograde cardioplegia was given with good arrest of the heart.  We moved to the posterior wall of the heart, and found a good target on the PDA.  An arteriotomy was made, and the vein graft was anastomosed to it in an end to side fashion.  Next we exposed the lateral wall, and found a good target on the OM.  An end to side anastomosis with the vein graft was then created.  Next, we exposed the anterior wall of the heart and identified a good target on first diagonal.   An arteriotomy was created.  The vein was anastomosed in an end to side fashion.   Finally, we exposed a good target on the LAD, and fashioned an end to side anastomosis between it and the LITA.  We began to re-warm, and a re-animation dose of cardioplegia was given.  The heart was de-aired, and the cross clamp was removed.  Meticulous hemostasis was obtained.    A partial occludding clamp was then placed on the ascending aorta, and we created an end to side anastomosis between it and the proximal vein grafts.  Rings were placed on the proximal anastomosis.  Hemostasis was obtained, and we separated from cardiopulmonary bypass without event.  The heparin was reversed with protamine.  Chest tubes and wires were placed, and the sternum was re-approximated with sternal wires.  The soft tissue and skin were re-approximated wth absorbable suture.    The patient tolerated the procedure without any immediate complications, and was transferred to the ICU in guarded condition.  Sarh Kirschenbaum Bary Leriche

## 2021-09-21 NOTE — TOC Benefit Eligibility Note (Signed)
Patient Teacher, English as a foreign language completed.    The patient is currently admitted and upon discharge could be taking Farxiga 10 mg.  The current 30 day co-pay is, $9.85.   The patient is currently admitted and upon discharge could be taking Jardiance 10 mg.  The current 30 day co-pay is, $9.85.   The patient is insured through [employer], [place] Patient Advocate Specialist [place] Patient Advocate Team Direct Number: [phone]  Fax: [phone]

## 2021-09-21 NOTE — Progress Notes (Signed)
Patient ID: Decola Mukhtar, male   DOB: 06-10-1951, 70 y.o.   MRN: [id number]  TCTS Evening Rounds:   Hemodynamically stable  CI = 2.7 Still on vent. Slow waking up  Urine output good  CT output low  CBC    Component Value Date/Time   WBC 7.0 09/21/2021 1254   RBC 3.85 (L) 09/21/2021 1254   HGB 11.1 (L) 09/21/2021 1254   HGB 14.5 08/25/2021 1147   HCT 34.0 (L) 09/21/2021 1254   HCT 43.8 08/25/2021 1147   PLT 78 (L) 09/21/2021 1254   PLT 158 08/25/2021 1147   MCV 88.3 09/21/2021 1254   MCV 86 08/25/2021 1147   MCH 28.8 09/21/2021 1254   MCHC 32.6 09/21/2021 1254   RDW 12.3 09/21/2021 1254   RDW 14.3 08/25/2021 1147   LYMPHSABS 1.3 12/19/2019 0831   LYMPHSABS 2.1 10/18/2016 1649   MONOABS 0.4 12/19/2019 0831   EOSABS 0.5 12/19/2019 0831   EOSABS 0.2 10/18/2016 1649   BASOSABS 0.0 12/19/2019 0831   BASOSABS 0.0 10/18/2016 1649     BMET    Component Value Date/Time   NA 139 09/21/2021 1145   NA 140 08/25/2021 1147   K 4.3 09/21/2021 1145   CL 104 09/21/2021 1145   CO2 22 09/17/2021 1013   GLUCOSE 84 09/21/2021 1145   BUN 17 09/21/2021 1145   BUN 27 08/25/2021 1147   CREATININE 0.90 09/21/2021 1145   CREATININE 1.24 09/28/2015 1240   CALCIUM 9.2 09/17/2021 1013   EGFR 56 (L) 08/25/2021 1147   GFRNONAA 57 (L) 09/17/2021 1013     A/P:  Stable postop course. Continue current plans. Wean vent when more awake.

## 2021-09-21 NOTE — Anesthesia Procedure Notes (Addendum)
Central Venous Catheter Insertion Performed by: Nolon Nations, MD, anesthesiologist Start/End12/27/2022 7:00 AM, 09/21/2021 7:20 AM Patient location: Pre-op. Preanesthetic checklist: patient identified, IV checked, site marked, risks and benefits discussed, surgical consent, monitors and equipment checked, pre-op evaluation, timeout performed and anesthesia consent Position: Trendelenburg Lidocaine 1% used for infiltration and patient sedated Hand hygiene performed  and maximum sterile barriers used  Catheter size: 8.5 Fr Central line was placed.Sheath introducer Procedure performed using ultrasound guided technique. Ultrasound Notes:anatomy identified, needle tip was noted to be adjacent to the nerve/plexus identified, no ultrasound evidence of intravascular and/or intraneural injection and image(s) printed for medical record Attempts: 1 Following insertion, line sutured, dressing applied and Biopatch. Post procedure assessment: blood return through all ports, free fluid flow and no air  Patient tolerated the procedure well with no immediate complications. Additional procedure comments: Triple lumen "SLIC" placed through introducer port. Marland Kitchen

## 2021-09-21 NOTE — Anesthesia Procedure Notes (Signed)
Arterial Line Insertion Start/End12/27/2022 7:10 AM, 09/21/2021 7:00 AM Performed by: Imagene Riches, CRNA, CRNA  Patient location: OOR procedure area. Preanesthetic checklist: patient identified, IV checked, site marked, risks and benefits discussed, surgical consent, monitors and equipment checked, pre-op evaluation, timeout performed and anesthesia consent Left, radial was placed Catheter size: 20 G Hand hygiene performed , maximum sterile barriers used  and Seldinger technique used  Attempts: 1 Procedure performed without using ultrasound guided technique.

## 2021-09-21 NOTE — Transfer of Care (Signed)
Immediate Anesthesia Transfer of Care Note  Patient: Barry [name]  Procedure(s) Performed: CORONARY ARTERY BYPASS GRAFTING (CABG)  X FOUR, ON PUMP, USING LEFT INTERNAL MAMMARY ARTERY AND RIGHT ENDOSCOPIC GREATER SAPHENOUS VEIN CONDUITS (Chest) TRANSESOPHAGEAL ECHOCARDIOGRAM (TEE) APPLICATION OF CELL SAVER ENDOVEIN HARVEST OF GREATER SAPHENOUS VEIN  Patient Location: PACU  Anesthesia Type:General  Level of Consciousness: sedated  Airway & Oxygen Therapy: Patient remains intubated per anesthesia plan and Patient placed on Ventilator (see vital sign flow sheet for setting)  Post-op Assessment: Report given to RN and Post -op Vital signs reviewed and stable  Post vital signs: Reviewed and stable  Last Vitals:  Vitals Value Taken Time  BP    Temp    Pulse    Resp 19 09/21/21 1246  SpO2 99 % 09/21/21 1246  Vitals shown include unvalidated device data.  Last Pain:  Vitals:   09/21/21 0630  TempSrc:   PainSc: 5       Patients Stated Pain Goal: 3 (06/77/03 4035)  Complications: No notable events documented.

## 2021-09-21 NOTE — Interval H&P Note (Signed)
History and Physical Interval Note:  09/21/2021 7:25 AM  Barry [name]  has presented today for surgery, with the diagnosis of CAD.  The various methods of treatment have been discussed with the patient and family. After consideration of risks, benefits and other options for treatment, the patient has consented to  Procedure(s): CORONARY ARTERY BYPASS GRAFTING (CABG) (N/A) TRANSESOPHAGEAL ECHOCARDIOGRAM (TEE) (N/A) as a surgical intervention.  The patient's history has been reviewed, patient examined, no change in status, stable for surgery.  I have reviewed the patient's chart and labs.  Questions were answered to the patient's satisfaction.     Mccartney Chuba Bary Leriche

## 2021-09-21 NOTE — Progress Notes (Deleted)
°  Echocardiogram 2D Echocardiogram has been performed.  Barry [name] 09/21/2021, 11:13 AM

## 2021-09-21 NOTE — Consult Note (Addendum)
NAMEAhlijah [name], MRN:  [id number], DOB:  [birth year], LOS: 0 ADMISSION DATE:  09/21/2021, CONSULTATION DATE:  09/21/2021 REFERRING MD:  Dr. Kipp Brood, CHIEF COMPLAINT:  post CABG  History of Present Illness:   70 year old male who speaks [place], admitted on 12/27 for elective CABG by Dr. Kipp Brood.   Prior history of CAD with prior MI and PCI to the RCA and circumflex in 2015, HFrEF (06/2021 EF 40-45%, G1DD, slightly reduced RV function), HTN, carotid stenosis, HLD, CKD stage 3, CVA (2011), DM, and depression.  He had been having fatigue and anginal symptoms in which he was evaluated for in December 2022.  Underwent left heart cath on 08/31/21 which showed LAD, diagonal, and distal RCA disease and in-stent restenosis of his circumflex stent.  Underwent CABG x 4 (LIMA to LAD, reverse saphenous vein graft to PDA, first diagonal, and OM with endoscopic greater saphenous vein harvest on the right) on 12/27.   Pertinent  Medical History  CAD with prior MI and PCI 2015, HFrEF, HTN, carotid stenosis, HLD, CKD stage 3, CVA (2011), DM, depression  Significant Hospital Events: Including procedures, antibiotic start and stop dates in addition to other pertinent events   12/27 CABG x 4  Interim History / Subjective:  Currently on PSV 10/5 Sedation off  Cardene gtt almost off for high MAP  Has required a-pacing for bradycardia   Objective   Blood pressure 108/81, pulse (!) 49, temperature 97.6 F (36.4 C), temperature source Axillary, resp. rate 14, height 5' 5"  (1.651 m), weight 72.6 kg, SpO2 100 %. CVP:  [5 mmHg-7 mmHg] 6 mmHg  Vent Mode: SIMV;PSV;PRVC FiO2 (%):  [40 %-50 %] 40 % Set Rate:  [4 bmp-12 bmp] 4 bmp Vt Set:  [490 mL] 490 mL PEEP:  [5 cmH20] 5 cmH20 Pressure Support:  [10 cmH20] 10 cmH20 Plateau Pressure:  [16 cmH20-18 cmH20] 18 cmH20   Intake/Output Summary (Last 24 hours) at 09/21/2021 1622 Last data filed at 09/21/2021 1500 Gross per 24 hour  Intake 3781.22 ml  Output 2528  ml  Net 1253.22 ml   Filed Weights   09/21/21 0612  Weight: 72.6 kg   Examination: prior sedated on precedex - now off General:  critically ill male waking up on MV HEENT: MM pink/moist, ETT at 20 at lip, OGT, pupils 3/reactive/ anicteric  Neuro:  will wake up to verbal and follow simple commands in all extremities CV: currently atrial paced, R IJ cordis with TL CVL site wnl, clean/ intact sternal dressing, pacing wires in place, mediastinal and pleural chest tubes- minimal output/ no leak, left radial aline  PULM:  currently on PSV 10/5 with TVs ~500, clear and diminished GI: soft, bs+ active, foley with slight blood around meatus, cyu Extremities: warm/dry, RLE extremity dressing clean- intact cap refill, no LLE swelling Skin: no rashes  Flotrac in place- CO 4.7, CI 2.6, SVR 986, cvp 6-7 Cardene at 73m/hr  Post op CXR with stable ETT/lines, no acute process   Resolved Hospital Problem list    Assessment & Plan:   Post op vent management - rapid wean per protocol CT surgery protocol  - aggressive pulm hygiene- IS/ mobilize post extubation - VAP/ PPI protocol  - wean O2 for sat goal > 90%   CAD s/p CABG x 4  HTN HLD  - per TCTS  - mediastinal/ pleural drains per TCTS - con't weaning pressors/ cardene as able to maintain MAP >70-90. Albumin boluses prn for low SV.  Cont  flotrac - con't atrial pacing until intrinsic rate picks up  - pain control per protocol- oxycodone, tramadol, morphine - ASA, statin to resume 12/28 - metoprolol BID now off pressors - complete post-op antibiotics - tele monitoring - monitor electrolytes, replete PRN - holding home lisinopril and imdur   CKD stage 3 - strict I/Os - continue foley  - serial renal indices    DM - transitioning off insulin gtt per CT surgery protocol/ endotool    Best Practice (right click and "Reselect all SmartList Selections" daily)   Diet/type: NPO DVT prophylaxis: SCD GI prophylaxis: PPI Lines: Central  line, Arterial Line, and yes and it is still needed Foley:  Yes, and it is still needed Code Status:  full code Last date of multidisciplinary goals of care discussion [per TCTS]  Wife remains at bedside.    Labs   CBC: Recent Labs  Lab 09/17/21 1013 09/21/21 0817 09/21/21 1050 09/21/21 1108 09/21/21 1142 09/21/21 1145 09/21/21 1254  WBC 8.7  --   --   --   --   --  7.0  HGB 15.0   < > 9.2* 8.2* 8.8* 9.5* 11.1*  HCT 47.1   < > 26.9* 24.0* 26.0* 28.0* 34.0*  MCV 89.4  --   --   --   --   --  88.3  PLT 147*  --  88*  --   --   --  78*   < > = values in this interval not displayed.    Basic Metabolic Panel: Recent Labs  Lab 09/17/21 1013 09/21/21 0817 09/21/21 0929 09/21/21 0958 09/21/21 1038 09/21/21 1042 09/21/21 1108 09/21/21 1142 09/21/21 1145  NA 136 141 142   < > 139 139 137 139 139  K 4.1 3.6 3.6   < > 4.5 4.5 4.9 4.3 4.3  CL 102 104 106  --   --  103  --   --  104  CO2 22  --   --   --   --   --   --   --   --   GLUCOSE 333* 87 87  --   --  88  --   --  84  BUN 21 18 17   --   --  18  --   --  17  CREATININE 1.34* 1.10 1.00  --   --  1.00  --   --  0.90  CALCIUM 9.2  --   --   --   --   --   --   --   --    < > = values in this interval not displayed.   GFR: Estimated Creatinine Clearance: 65.5 mL/min (by C-G formula based on SCr of 0.9 mg/dL). Recent Labs  Lab 09/17/21 1013 09/21/21 1254  WBC 8.7 7.0    Liver Function Tests: Recent Labs  Lab 09/17/21 1013  AST 19  ALT 22  ALKPHOS 78  BILITOT 0.7  PROT 7.2  ALBUMIN 4.0   No results for input(s): LIPASE, AMYLASE in the last 168 hours. No results for input(s): AMMONIA in the last 168 hours.  ABG    Component Value Date/Time   PHART 7.442 09/21/2021 1142   PCO2ART 38.8 09/21/2021 1142   PO2ART 378 (H) 09/21/2021 1142   HCO3 26.5 09/21/2021 1142   TCO2 26 09/21/2021 1145   ACIDBASEDEF 0.2 09/17/2021 1031   O2SAT 100.0 09/21/2021 1142     Coagulation Profile: Recent Labs  Lab  09/17/21  1013 09/21/21 1254  INR 0.9 1.4*    Cardiac Enzymes: No results for input(s): CKTOTAL, CKMB, CKMBINDEX, TROPONINI in the last 168 hours.  HbA1C: Hgb A1c MFr Bld  Date/Time Value Ref Range Status  09/17/2021 10:15 AM 8.9 (H) 4.8 - 5.6 % Final    Comment:    (NOTE) Pre diabetes:          5.7%-6.4%  Diabetes:              >6.4%  Glycemic control for   <7.0% adults with diabetes   06/07/2021 01:55 PM 8.7 (H) 4.6 - 6.5 % Final    Comment:    Glycemic Control Guidelines for People with Diabetes:Non Diabetic:  <6%Goal of Therapy: <7%Additional Action Suggested:  >8%     CBG: Recent Labs  Lab 09/21/21 0614 09/21/21 1256 09/21/21 1414 09/21/21 1505 09/21/21 1605  GLUCAP 88 94 101* 100* 90    Review of Systems:   Unable   Past Medical History:  He,  has a past medical history of Anginal pain ([place]), CAD (coronary artery disease), Carotid stenosis, CKD (chronic kidney disease) stage 3, Depression, Diabetes mellitus, 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   Surgical History:   Past Surgical History:  Procedure Laterality Date   CARDIAC CATHETERIZATION     CARDIAC CATHETERIZATION N/A 06/25/2015   Procedure: Left Heart Cath and Coronary Angiography;  Surgeon: Burnell Blanks, MD;  Location: [facility];  Service: Cardiovascular;  Laterality: N/A;   COLONOSCOPY WITH PROPOFOL N/A 07/23/2015   Procedure: COLONOSCOPY WITH PROPOFOL;  Surgeon: [name], MD;  Location: [facility];  Service: Endoscopy;  Laterality: N/A;   CORONARY ANGIOPLASTY     LEFT HEART CATH AND CORONARY ANGIOGRAPHY N/A 08/31/2021   Procedure: LEFT HEART CATH AND CORONARY ANGIOGRAPHY;  Surgeon: Belva Crome, MD;  Location: [facility];   Service: Cardiovascular;  Laterality: N/A;   LEFT HEART CATHETERIZATION WITH CORONARY ANGIOGRAM N/A 02/26/2014   Procedure: LEFT HEART CATHETERIZATION WITH CORONARY ANGIOGRAM;  Surgeon: Wellington Hampshire, MD;  Location: [facility];  Service: Cardiovascular;  Laterality: N/A;   LEFT HEART CATHETERIZATION WITH CORONARY ANGIOGRAM N/A 03/10/2014   Procedure: LEFT HEART CATHETERIZATION WITH CORONARY ANGIOGRAM;  Surgeon: Jettie Booze, MD;  Location: [facility];  Service: Cardiovascular;  Laterality: N/A;     Social History:   reports that he quit smoking about 14 years ago. His smoking use included cigarettes. He has a 9.50 pack-year smoking history. He has never used smokeless tobacco. He reports that he does not drink alcohol and does not use drugs.   Family History:  His family history includes Diabetes in his father; Heart attack in his father; Hypertension in his father. There is no history of Stroke.   Allergies Allergies  Allergen Reactions   Metformin And Related     Pt feels that this causes constipation and will not take      Home Medications  Prior to Admission medications   Medication Sig Start Date End Date Taking? Authorizing Provider  aspirin EC 81 MG EC tablet Take 1 tablet (81 mg total) by mouth daily. 05/05/17  Yes Vann, Jessica U, DO  gabapentin (NEURONTIN) 300 MG capsule Take 300 mg by mouth 3 (three) times daily.   Yes [provider]  isosorbide mononitrate (IMDUR) 60  MG 24 hr tablet Take 1 tablet (60 mg total) by mouth daily. Patient taking differently: Take 30 mg by mouth daily. 09/07/21 09/07/22 Yes Weaver, Scott T, PA-C  lisinopril (ZESTRIL) 20 MG tablet Take 0.5 tablets (10 mg total) by mouth daily. 07/13/21  Yes Weaver, Scott T, PA-C  metoprolol succinate (TOPROL-XL) 50 MG 24 hr tablet Take 1 tablet (50 mg total) by mouth daily. Take with or immediately following a meal. 08/25/21  Yes Weaver, Scott T, PA-C  rosuvastatin (CRESTOR) 40 MG tablet Take 1  tablet by mouth once daily 12/14/20  Yes Weaver, Scott T, PA-C  blood glucose meter kit and supplies Dispense based on patient and insurance preference. Use up to four times daily as directed. (FOR ICD-10 E10.9, E11.9). 05/10/19   Copland, Gay Filler, MD  glucose blood (ONETOUCH ULTRA) test strip USE 1 STRIP TO CHECK GLUCOSE TWICE DAILY . 06/07/21   Copland, Gay Filler, MD  insulin glargine (LANTUS) 100 UNIT/ML injection Inject 0.22-0.27 mLs (22-27 Units total) into the skin daily. 22-"normal blood sugar reading" 27-"elevated blood sugar readings" 09/15/21   Copland, Gay Filler, MD  Insulin Pen Needle (TECHLITE PEN NEEDLES) 32G X 8 MM MISC Use daily to administer insulin 09/15/21   Copland, Gay Filler, MD  Lancets (ONETOUCH DELICA PLUS [id number]) [place] SMARTSIG:1 Topical Daily 06/12/20   [provider]     Critical care time: 35 mins     Kennieth Rad, ACNP [facility] 09/21/2021, 4:22 PM  See Amion for pager If no response to pager, please call PCCM consult pager After 7:00 pm call Elink    09/21/2021   Attending attestation   I have seen and evaluated the patient for postop care.   S:  70 year old man w/ hx of CAD, CKD, DM presenting for 4V CABG.  On small dose phenylephrine that seems to respond to volume.  Arrives to ICU intubated and sedated.  Pump time 157mn, crossclamp time 62 min.     O: Blood pressure 100/72, pulse (!) 49, temperature (!) 97.4 F (36.3 C), temperature source Axillary, resp. rate 12, height 5' 5"  (1.651 m), weight 72.6 kg, SpO2 99 %.  Sedated man on vent Pupils equal and reactive Mediastinal and blake drains with small serosanguinous output Triggering vent Lungs clear Ext warm   CBG good on insulin gtt Chemistry benign Mild thrombocytopenia on CBC in setting of postop ABLA   A:  Postop shock related to fluid shifts, blood loss, inflammatory effects of bypass improving   Postop vent management   Ischemic cardiomyopathy  s/p 4V CABG   DM2- A1c 8.9%   CKD1   P:  - Hold sedation and continue attempts at SAT/SBT - Wean phenylephrine to MAP 65 - Chest tube management and inotrope wean per TCTS - Continue insulin gtt for now - Will follow w/ you   Patient critically ill due to shock, respiratory failure Interventions to address this today vent/pressor weaning Risk of deterioration without these interventions is high   I personally spent 31 minutes providing critical care not including any separately billable procedures   DErskine EmeryMD [facility] Prefer epic messenger for cross cover needs If after hours, please call E-link

## 2021-09-21 NOTE — Procedures (Signed)
Extubation Procedure Note  Patient Details:   Name: Barry [name] DOB: 1951/03/16 MRN: [id number]   Airway Documentation:    Vent end date 09/21/2021 Vent end time 2130  Evaluation  O2 sats: stable throughout Complications: No apparent complications Patient did tolerate procedure well. Bilateral Breath Sounds: Clear, Diminished   Pt NIF of -28 VC 780. Extubated to 4L water bottle [name]. Pt able to speak name and verbalize pain. No stridor noted. RT will cont to monitor.   [place] G Kamrie Fanton 09/21/2021, 9:30 PM

## 2021-09-21 NOTE — Brief Op Note (Signed)
09/21/2021  7:00 AM  PATIENT:  Barry [name]  70 y.o. male  PRE-OPERATIVE DIAGNOSIS:  CORONARY ARTERY DISEASE  POST-OPERATIVE DIAGNOSIS:  CORONARY ARTERY DISEASE  PROCEDURE:  Procedure(s): CORONARY ARTERY BYPASS GRAFTING (CABG)  X FOUR, ON PUMP, USING LEFT INTERNAL MAMMARY ARTERY AND RIGHT ENDOSCOPIC GREATER SAPHENOUS VEIN CONDUITS (N/A) TRANSESOPHAGEAL ECHOCARDIOGRAM (TEE) (N/A) APPLICATION OF CELL SAVER ENDOVEIN HARVEST OF GREATER SAPHENOUS VEIN LIMA-LAD SVG-OM SVG-DIAG SVG-PD EVH 55/10  SURGEON:  Surgeon(s) and Role:    * Lightfoot, Lucile Crater, MD - Primary  PHYSICIAN ASSISTANT: Teodor Prater PA-C  ASSISTANTS: STAFF   ANESTHESIA:   general  EBL:  616 mL   BLOOD ADMINISTERED:none  DRAINS:  CHEST DRAINS IN THE LEFT PLEURA AND MEDIASTINUM    LOCAL MEDICATIONS USED:  NONE  SPECIMEN:  No Specimen  DISPOSITION OF SPECIMEN:  N/A  COUNTS:  YES  TOURNIQUET:  * No tourniquets in log *  DICTATION: .Dragon Dictation  PLAN OF CARE: Admit to inpatient   PATIENT DISPOSITION:  ICU - intubated and hemodynamically stable.   Delay start of Pharmacological VTE agent (>24hrs) due to surgical blood loss or risk of bleeding: yes  COMPLICATIONS: NO KNOWN

## 2021-09-21 NOTE — Anesthesia Procedure Notes (Signed)
Procedure Name: Intubation Date/Time: 09/21/2021 7:51 AM Performed by: Imagene Riches, CRNA Pre-anesthesia Checklist: Patient identified, Emergency Drugs available, Suction available and Patient being monitored Patient Re-evaluated:Patient Re-evaluated prior to induction Oxygen Delivery Method: Circle System Utilized Preoxygenation: Pre-oxygenation with 100% oxygen Induction Type: IV induction Ventilation: Mask ventilation without difficulty Laryngoscope Size: Miller and 2 Grade View: Grade I Tube type: Oral Tube size: 8.0 mm Number of attempts: 1 Airway Equipment and Method: Stylet and Oral airway Placement Confirmation: ETT inserted through vocal cords under direct vision, positive ETCO2 and breath sounds checked- equal and bilateral Secured at: 22 cm Tube secured with: Tape Dental Injury: Teeth and Oropharynx as per pre-operative assessment

## 2021-09-21 NOTE — Care Management (Signed)
°  Transition of Care (TOC) Screening Note   Patient Details  Name: Barry [name] Date of Birth: 02-05-1951   Transition of Care [facility] CM/SW Contact:    Carles Collet, RN Phone Number: 09/21/2021, 2:30 PM    Transition of Care Department [facility]) has reviewed patient and no TOC needs have been identified at this time. We will continue to monitor patient advancement through interdisciplinary progression rounds. If new patient transition needs arise, please place a TOC consult.

## 2021-09-21 NOTE — Hospital Course (Addendum)
History of Present Illness:     70 year old male referred by Dr. Tamala Julian for surgical evaluation of three-vessel coronary artery disease.  He does have a history of PCI in 2015 with stent placement to the RCA and circumflex system.  Most recent left heart cath in 2016 showed that the stents were patent.  He has been having some anginal symptoms and has come to the emergency department for evaluation of this.  He recently underwent another left heart cath on 08/31/2021 which showed LAD, diagonal, and distal RCA disease.  He has an in-stent restenosis of his circumflex stent.   He denies any chest pain or shortness of breath but admits to fatigability.  He was referred to Dr. Kipp Brood who evaluated the patient and all relevant studies and recommended proceeding with coronary artery surgical revascularization.  He was admitted this hospitalization for the procedure.  Hospital course:  The patient was admitted electively and taken to the operating room on 09/21/2021 at which time he underwent CABG x4.  He tolerated the procedure well was taken to the surgical intensive care unit in stable condition.  Postoperative hospital course:  The patient is doing well.  He was extubated using standard post cardiac surgical protocols without difficulty.  He is noted to have some expected postoperative volume overload and has been given a course of diuretics.  He has an expected acute blood loss anemia which is stable.  All routine lines, monitors and drainage devices have been discontinued in the standard fashion.  On postop day #2 patient developed a significant ileus with nausea and vomiting.  NG tube was attempted without success.  Ambulation was encouraged and narcotics were limited as able.  Over time the ileus resolved and he was having normal bowel movements and no further abdominal pain/nausea or vomiting the patient did have an acute kidney injury with peak creatinine of 2.05 but it has been improving over time.   At the time of discharge his creatinine was 1.50.  He is undergoing a routine course of cardiac rehab and pulmonary hygiene.  All incisions were noted to be healing well without evidence of infection.  He was tolerating diet.  Oxygen was weaned off and he maintains good oxygen saturations on room air.  He is tolerating routine cardiac rehab modalities at the time of discharge the patient was felt to be quite stable.

## 2021-09-22 ENCOUNTER — Encounter (HOSPITAL_COMMUNITY): Payer: Self-pay | Admitting: Thoracic Surgery (Cardiothoracic Vascular Surgery)

## 2021-09-22 ENCOUNTER — Inpatient Hospital Stay (HOSPITAL_COMMUNITY): Payer: Medicare Other

## 2021-09-22 DIAGNOSIS — Z951 Presence of aortocoronary bypass graft: Secondary | ICD-10-CM | POA: Diagnosis not present

## 2021-09-22 LAB — BASIC METABOLIC PANEL WITH GFR
Anion gap: 8 (ref 5–15)
BUN: 17 mg/dL (ref 8–23)
CO2: 24 mmol/L (ref 22–32)
Calcium: 7.9 mg/dL — ABNORMAL LOW (ref 8.9–10.3)
Chloride: 108 mmol/L (ref 98–111)
Creatinine, Ser: 1.22 mg/dL (ref 0.61–1.24)
GFR, Estimated: >60 mL/min
Glucose, Bld: 152 mg/dL — ABNORMAL HIGH (ref 70–99)
Potassium: 4.1 mmol/L (ref 3.5–5.1)
Sodium: 140 mmol/L (ref 135–145)

## 2021-09-22 LAB — CBC
HCT: 30.2 % — ABNORMAL LOW (ref 39.0–52.0)
HCT: 32.9 % — ABNORMAL LOW (ref 39.0–52.0)
Hemoglobin: 10 g/dL — ABNORMAL LOW (ref 13.0–17.0)
Hemoglobin: 10.3 g/dL — ABNORMAL LOW (ref 13.0–17.0)
MCH: 29.2 pg (ref 26.0–34.0)
MCH: 28.5 pg (ref 26.0–34.0)
MCHC: 33.1 g/dL (ref 30.0–36.0)
MCHC: 31.3 g/dL (ref 30.0–36.0)
MCV: 88 fL (ref 80.0–100.0)
MCV: 90.9 fL (ref 80.0–100.0)
Platelets: 87 10*3/uL — ABNORMAL LOW (ref 150–400)
Platelets: 97 10*3/uL — ABNORMAL LOW (ref 150–400)
RBC: 3.43 MIL/uL — ABNORMAL LOW (ref 4.22–5.81)
RBC: 3.62 MIL/uL — ABNORMAL LOW (ref 4.22–5.81)
RDW: 12.6 % (ref 11.5–15.5)
RDW: 12.8 % (ref 11.5–15.5)
WBC: 10.6 10*3/uL — ABNORMAL HIGH (ref 4.0–10.5)
WBC: 14.3 10*3/uL — ABNORMAL HIGH (ref 4.0–10.5)
nRBC: 0 % (ref 0.0–0.2)
nRBC: 0 % (ref 0.0–0.2)

## 2021-09-22 LAB — GLUCOSE, CAPILLARY
Glucose-Capillary: 123 mg/dL — ABNORMAL HIGH (ref 70–99)
Glucose-Capillary: 130 mg/dL — ABNORMAL HIGH (ref 70–99)
Glucose-Capillary: 134 mg/dL — ABNORMAL HIGH (ref 70–99)
Glucose-Capillary: 141 mg/dL — ABNORMAL HIGH (ref 70–99)
Glucose-Capillary: 141 mg/dL — ABNORMAL HIGH (ref 70–99)
Glucose-Capillary: 143 mg/dL — ABNORMAL HIGH (ref 70–99)
Glucose-Capillary: 147 mg/dL — ABNORMAL HIGH (ref 70–99)
Glucose-Capillary: 148 mg/dL — ABNORMAL HIGH (ref 70–99)
Glucose-Capillary: 148 mg/dL — ABNORMAL HIGH (ref 70–99)
Glucose-Capillary: 153 mg/dL — ABNORMAL HIGH (ref 70–99)
Glucose-Capillary: 154 mg/dL — ABNORMAL HIGH (ref 70–99)
Glucose-Capillary: 166 mg/dL — ABNORMAL HIGH (ref 70–99)
Glucose-Capillary: 187 mg/dL — ABNORMAL HIGH (ref 70–99)

## 2021-09-22 LAB — MAGNESIUM
Magnesium: 2.3 mg/dL (ref 1.7–2.4)
Magnesium: 2.2 mg/dL (ref 1.7–2.4)

## 2021-09-22 LAB — BASIC METABOLIC PANEL
Anion gap: 12 (ref 5–15)
BUN: 20 mg/dL (ref 8–23)
CO2: 23 mmol/L (ref 22–32)
Calcium: 8.4 mg/dL — ABNORMAL LOW (ref 8.9–10.3)
Chloride: 100 mmol/L (ref 98–111)
Creatinine, Ser: 1.34 mg/dL — ABNORMAL HIGH (ref 0.61–1.24)
GFR, Estimated: 57 mL/min — ABNORMAL LOW (ref >60)
Glucose, Bld: 172 mg/dL — ABNORMAL HIGH (ref 70–99)
Potassium: 4 mmol/L (ref 3.5–5.1)
Sodium: 135 mmol/L (ref 135–145)

## 2021-09-22 MED ORDER — HYDRALAZINE HCL 20 MG/ML IJ SOLN: 10.0000 mg | Freq: Four times a day (QID) | INTRAMUSCULAR | Status: DC | PRN

## 2021-09-22 MED ORDER — METOPROLOL TARTRATE 25 MG PO TABS
25.0000 mg | ORAL_TABLET | Freq: Two times a day (BID) | ORAL | Status: DC
  Administered 2021-09-22 – 2021-09-28 (×9): 25 mg via ORAL
  Filled 2021-09-22 (×8): qty 1

## 2021-09-22 MED ORDER — SODIUM CHLORIDE 0.9% FLUSH
10.0000 mL | INTRAVENOUS | Status: DC | PRN
  Administered 2021-09-22: 09:00:00 10 mL

## 2021-09-22 MED ORDER — INSULIN ASPART 100 UNIT/ML IJ SOLN
0.0000 [IU] | INTRAMUSCULAR | Status: DC
  Administered 2021-09-22: 16:00:00 4 [IU] via SUBCUTANEOUS
  Administered 2021-09-22: 12:00:00 2 [IU] via SUBCUTANEOUS
  Administered 2021-09-22: 23:00:00 4 [IU] via SUBCUTANEOUS
  Administered 2021-09-22 – 2021-09-23 (×3): 2 [IU] via SUBCUTANEOUS
  Administered 2021-09-23: 16:00:00 8 [IU] via SUBCUTANEOUS
  Administered 2021-09-23: 12:00:00 4 [IU] via SUBCUTANEOUS
  Administered 2021-09-23: 23:00:00 2 [IU] via SUBCUTANEOUS
  Administered 2021-09-24: 17:00:00 4 [IU] via SUBCUTANEOUS
  Administered 2021-09-24: 20:00:00 8 [IU] via SUBCUTANEOUS
  Administered 2021-09-24: 08:00:00 4 [IU] via SUBCUTANEOUS
  Administered 2021-09-24: 04:00:00 8 [IU] via SUBCUTANEOUS
  Administered 2021-09-24: 13:00:00 2 [IU] via SUBCUTANEOUS
  Administered 2021-09-25 (×3): 4 [IU] via SUBCUTANEOUS

## 2021-09-22 MED ORDER — FUROSEMIDE 10 MG/ML IJ SOLN
40.0000 mg | Freq: Once | INTRAMUSCULAR | Status: AC
  Administered 2021-09-22: 09:00:00 40 mg via INTRAVENOUS
  Filled 2021-09-22: qty 4

## 2021-09-22 MED ORDER — ENOXAPARIN SODIUM 40 MG/0.4ML IJ SOSY
40.0000 mg | PREFILLED_SYRINGE | Freq: Every day | INTRAMUSCULAR | Status: DC
  Administered 2021-09-22 – 2021-09-27 (×5): 40 mg via SUBCUTANEOUS
  Filled 2021-09-22 (×5): qty 0.4

## 2021-09-22 MED FILL — Potassium Chloride Inj 2 mEq/ML: INTRAVENOUS | Qty: 40 | Status: AC

## 2021-09-22 MED FILL — Heparin Sodium (Porcine) Inj 1000 Unit/ML: Qty: 1000 | Status: AC

## 2021-09-22 MED FILL — Lidocaine HCl Local Preservative Free (PF) Inj 2%: INTRAMUSCULAR | Qty: 15 | Status: AC

## 2021-09-22 NOTE — Anesthesia Postprocedure Evaluation (Signed)
Anesthesia Post Note  Patient: Barry [name]  Procedure(s) Performed: CORONARY ARTERY BYPASS GRAFTING (CABG)  X FOUR, ON PUMP, USING LEFT INTERNAL MAMMARY ARTERY AND RIGHT ENDOSCOPIC GREATER SAPHENOUS VEIN CONDUITS (Chest) TRANSESOPHAGEAL ECHOCARDIOGRAM (TEE) APPLICATION OF CELL SAVER ENDOVEIN HARVEST OF GREATER SAPHENOUS VEIN     Patient location during evaluation: PACU Anesthesia Type: General Level of consciousness: patient cooperative and awake and alert Pain management: pain level not controlled Vital Signs Assessment: post-procedure vital signs reviewed and stable Respiratory status: spontaneous breathing Cardiovascular status: stable Anesthetic complications: no   No notable events documented.  Last Vitals:  Vitals:   09/22/21 0600 09/22/21 0700  BP: 135/64 127/73  Pulse:    Resp: 13 19  Temp: 37.3 C 37.3 C  SpO2: 98% 99%    Last Pain:  Vitals:   09/22/21 0841  TempSrc:   PainSc: [place]

## 2021-09-22 NOTE — Progress Notes (Signed)
° °   °  [address]       [place] [address]             [phone]                 1 Day Post-Op Procedure(s) (LRB): CORONARY ARTERY BYPASS GRAFTING (CABG)  X FOUR, ON PUMP, USING LEFT INTERNAL MAMMARY ARTERY AND RIGHT ENDOSCOPIC GREATER SAPHENOUS VEIN CONDUITS (N/A) TRANSESOPHAGEAL ECHOCARDIOGRAM (TEE) (N/A) APPLICATION OF CELL SAVER ENDOVEIN HARVEST OF GREATER SAPHENOUS VEIN   Events: No events extubated _______________________________________________________________ Vitals: BP 127/73    Pulse 76    Temp 99.1 F (37.3 C)    Resp 19    Ht 5\' 5"  (1.651 m)    Wt 76.7 kg    SpO2 99%    BMI 28.14 kg/m  Filed Weights   09/21/21 0612 09/22/21 0500  Weight: 72.6 kg 76.7 kg     - Neuro: alert NAD  - Cardiovascular: sinus  Drips: none.   CVP:  [2 mmHg-14 mmHg] 14 mmHg  - Pulm: EWOB   ABG    Component Value Date/Time   PHART 7.356 09/21/2021 2210   PCO2ART 40.6 09/21/2021 2210   PO2ART 102 09/21/2021 2210   HCO3 22.7 09/21/2021 2210   TCO2 24 09/21/2021 2210   ACIDBASEDEF 3.0 (H) 09/21/2021 2210   O2SAT 98.0 09/21/2021 2210    - Abd: ND - Extremity: warm  .Intake/Output      12/27 0701 12/28 0700 12/28 0701 12/29 0700   P.O. 60    I.V. (mL/kg) 2736.2 (35.7)    Blood 390    IV Piggyback 1772.4    Total Intake(mL/kg) 4958.6 (64.6)    Urine (mL/kg/hr) 3680 (2) 300 (2.1)   Blood 616    Chest Tube 462    Total Output 4758 300   Net +200.6 -300           _______________________________________________________________ Labs: CBC Latest Ref Rng & Units 09/22/2021 09/21/2021 09/21/2021  WBC 4.0 - 10.5 K/uL 10.6(H) - -  Hemoglobin 13.0 - 17.0 g/dL 10.0(L) 9.9(L) 10.5(L)  Hematocrit 39.0 - 52.0 % 30.2(L) 29.0(L) 31.0(L)  Platelets 150 - 400 K/uL 87(L) - -   CMP Latest Ref Rng & Units 09/22/2021 09/21/2021 09/21/2021  Glucose 70 - 99 mg/dL 152(H) - -  BUN 8 - 23 mg/dL 17 - -  Creatinine 0.61 - 1.24 mg/dL 1.22 - -  Sodium 135 - 145 mmol/L  140 142 142  Potassium 3.5 - 5.1 mmol/L 4.1 3.7 3.7  Chloride 98 - 111 mmol/L 108 - -  CO2 22 - 32 mmol/L 24 - -  Calcium 8.9 - 10.3 mg/dL 7.9(L) - -  Total Protein 6.5 - 8.1 g/dL - - -  Total Bilirubin 0.3 - 1.2 mg/dL - - -  Alkaline Phos 38 - 126 U/L - - -  AST 15 - 41 U/L - - -  ALT 0 - 44 U/L - - -    CXR: PV congestion  _______________________________________________________________  Assessment and Plan: POD 1 s/p CABG  Neuro: pain controlled CV: increasing BB.  On statin and aspirin.  Will keep wires for now Pulm: pulm toilet Renal: creat stable.  Gentle diuresis today GI: on diet Heme: stable ID: [email or web address] Endo: SSI Dispo: continue ICU care   Lajuana Matte 09/22/2021 8:53 AM

## 2021-09-22 NOTE — Progress Notes (Addendum)
NAMENarada [name], MRN:  [id number], DOB:  19-Jan-1951, LOS: 1 ADMISSION DATE:  09/21/2021, CONSULTATION DATE:  09/21/2021 REFERRING MD:  Dr. Kipp Brood, CHIEF COMPLAINT:  post CABG  History of Present Illness:   70 year old male who speaks [place], admitted on 12/27 for elective CABG by Dr. Kipp Brood.   Prior history of CAD with prior MI and PCI to the RCA and circumflex in 2015, HFrEF (06/2021 EF 40-45%, G1DD, slightly reduced RV function), HTN, carotid stenosis, HLD, CKD stage 3, CVA (2011), DM, and depression.  He had been having fatigue and anginal symptoms in which he was evaluated for in December 2022.  Underwent left heart cath on 08/31/21 which showed LAD, diagonal, and distal RCA disease and in-stent restenosis of his circumflex stent.  Underwent CABG x 4 (LIMA to LAD, reverse saphenous vein graft to PDA, first diagonal, and OM with endoscopic greater saphenous vein harvest on the right) on 12/27.   Pertinent  Medical History  CAD with prior MI and PCI 2015, HFrEF, HTN, carotid stenosis, HLD, CKD stage 3, CVA (2011), DM, depression  Significant Hospital Events: Including procedures, antibiotic start and stop dates in addition to other pertinent events   12/27 CABG x 4   Interim History / Subjective:  Extubated last evening, now on 2L  Off pressors Transitioning off insulin gtt Not requiring further pacing No events overnight  Objective   Blood pressure 118/68, pulse 64, temperature 99.5 F (37.5 C), resp. rate 10, height 5\' 5"  (1.651 m), weight 76.7 kg, SpO2 97 %. CVP:  [2 mmHg-14 mmHg] 5 mmHg    Intake/Output Summary (Last 24 hours) at 09/22/2021 1238 Last data filed at 09/22/2021 1019 Gross per 24 hour  Intake 1470.96 ml  Output 4807 ml  Net -3336.04 ml   Filed Weights   09/21/21 0612 09/22/21 0500  Weight: 72.6 kg 76.7 kg   Examination: wife at bedside to help translate General:  older adult male sitting upright in bed in NAD HEENT: MM pink/moist Neuro:  Awake,  oriented, follows commands, MAE CV: NSR, no murmur, sternal dressing CDI, pleural and mediastinal tube in place, pacing wires remain, R IJ cordis wnl PULM:  non labored, CTA/ diminished in bases, ~500 ml on IS GI: soft, bs+, NT/ ND, foley  Extremities: warm/dry, no LE edema, RLE incisions wnl Skin: no rashes   UOP 1.2L / 12 hrs  CT output 110 ml/ 12 hrs CVP 5-6 CO 4.9 CI 2.7 SVR 1106  Labs reviewed: stable sCr, WBC 13.6 > 10.6, Hgb 11.7 > 10, plts 97 > 87  Resolved Hospital Problem list    Assessment & Plan:   Postoperative vent management - aggressive pulmonary hygiene- IS/ mobilize  - wean FiO2 for sat goal > 92%   CAD s/p CABG x 4  HTN HLD  Thrombocytopenia  - per TCTS  - mediastinal/ pleural drains per TCTS - goal MAP > 65, remains off pressors - pacing wires per TCTS - pain control per protocol- oxycodone, tramadol, morphine w/ bowel regimen  - ASA, statin resume 12/28 - metoprolol BID  - complete post-op antibiotics - tele monitoring - monitor electrolytes, replete PRN - holding home lisinopril and imdur - stable platelets    CKD stage 3 - strict I/Os -  foley to be removed today  - trend renal indices    DM - transitioning off insulin gtt per CT surgery protocol/ endotool    Best Practice (right click and "Reselect all SmartList Selections" daily)  Diet/type: Regular consistency (see orders) DVT prophylaxis: SCD GI prophylaxis: PPI Lines: Central line and yes and it is still needed Foley:  Yes, and it is no longer needed and removal ordered  Code Status:  full code Last date of multidisciplinary goals of care discussion [per TCTS]  Wife remains at bedside.    Labs   CBC: Recent Labs  Lab 09/17/21 1013 09/21/21 0817 09/21/21 1050 09/21/21 1108 09/21/21 1254 09/21/21 1258 09/21/21 1733 09/21/21 1802 09/21/21 2103 09/21/21 2210 09/22/21 0319  WBC 8.7  --   --   --  7.0  --   --  13.6*  --   --  10.6*  HGB 15.0   < > 9.2*   < >  11.1*   < > 11.2* 11.7* 10.5* 9.9* 10.0*  HCT 47.1   < > 26.9*   < > 34.0*   < > 33.0* 35.9* 31.0* 29.0* 30.2*  MCV 89.4  --   --   --  88.3  --   --  88.6  --   --  88.0  PLT 147*  --  88*  --  78*  --   --  97*  --   --  87*   < > = values in this interval not displayed.    Basic Metabolic Panel: Recent Labs  Lab 09/17/21 1013 09/21/21 0817 09/21/21 0929 09/21/21 0958 09/21/21 1042 09/21/21 1108 09/21/21 1145 09/21/21 1258 09/21/21 1733 09/21/21 1802 09/21/21 2103 09/21/21 2210 09/22/21 0319  NA 136   < > 142   < > 139   < > 139   < > 141 138 142 142 140  K 4.1   < > 3.6   < > 4.5   < > 4.3   < > 4.1 4.5 3.7 3.7 4.1  CL 102   < > 106  --  103  --  104  --   --  109  --   --  108  CO2 22  --   --   --   --   --   --   --   --  21*  --   --  24  GLUCOSE 333*   < > 87  --  88  --  84  --   --  133*  --   --  152*  BUN 21   < > 17  --  18  --  17  --   --  16  --   --  17  CREATININE 1.34*   < > 1.00  --  1.00  --  0.90  --   --  1.30*  --   --  1.22  CALCIUM 9.2  --   --   --   --   --   --   --   --  8.3*  --   --  7.9*  MG  --   --   --   --   --   --   --   --   --  3.3*  --   --  2.3   < > = values in this interval not displayed.   GFR: Estimated Creatinine Clearance: 53.1 mL/min (by C-G formula based on SCr of 1.22 mg/dL). Recent Labs  Lab 09/17/21 1013 09/21/21 1254 09/21/21 1802 09/22/21 0319  WBC 8.7 7.0 13.6* 10.6*    Liver Function Tests: Recent Labs  Lab 09/17/21 1013  AST 19  ALT 22  ALKPHOS 78  BILITOT 0.7  PROT 7.2  ALBUMIN 4.0   No results for input(s): LIPASE, AMYLASE in the last 168 hours. No results for input(s): AMMONIA in the last 168 hours.  ABG    Component Value Date/Time   PHART 7.356 09/21/2021 2210   PCO2ART 40.6 09/21/2021 2210   PO2ART 102 09/21/2021 2210   HCO3 22.7 09/21/2021 2210   TCO2 24 09/21/2021 2210   ACIDBASEDEF 3.0 (H) 09/21/2021 2210   O2SAT 98.0 09/21/2021 2210     Coagulation Profile: Recent Labs  Lab  09/17/21 1013 09/21/21 1254  INR 0.9 1.4*    Cardiac Enzymes: No results for input(s): CKTOTAL, CKMB, CKMBINDEX, TROPONINI in the last 168 hours.  HbA1C: Hgb A1c MFr Bld  Date/Time Value Ref Range Status  09/17/2021 10:15 AM 8.9 (H) 4.8 - 5.6 % Final    Comment:    (NOTE) Pre diabetes:          5.7%-6.4%  Diabetes:              >6.4%  Glycemic control for   <7.0% adults with diabetes   06/07/2021 01:55 PM 8.7 (H) 4.6 - 6.5 % Final    Comment:    Glycemic Control Guidelines for People with Diabetes:Non Diabetic:  <6%Goal of Therapy: <7%Additional Action Suggested:  >8%     CBG: Recent Labs  Lab 09/22/21 0504 09/22/21 0610 09/22/21 0704 09/22/21 0825 09/22/21 1104  GLUCAP 123* 148* 148* 141* 153*     Critical care time: n/a     Kennieth Rad, ACNP [facility] 09/22/2021, 12:38 PM  See Amion for pager If no response to pager, please call PCCM consult pager After 7:00 pm call [place]    Attending note  Seen and examined. Recovering well. Follow usual pathway. Will follow along with you while in 2H.

## 2021-09-23 ENCOUNTER — Inpatient Hospital Stay (HOSPITAL_COMMUNITY): Payer: Medicare Other

## 2021-09-23 DIAGNOSIS — K567 Ileus, unspecified: Secondary | ICD-10-CM | POA: Diagnosis not present

## 2021-09-23 DIAGNOSIS — N189 Chronic kidney disease, unspecified: Secondary | ICD-10-CM | POA: Diagnosis not present

## 2021-09-23 DIAGNOSIS — N179 Acute kidney failure, unspecified: Secondary | ICD-10-CM

## 2021-09-23 LAB — CBC
HCT: 32.4 % — ABNORMAL LOW (ref 39.0–52.0)
Hemoglobin: 9.9 g/dL — ABNORMAL LOW (ref 13.0–17.0)
MCH: 27.9 pg (ref 26.0–34.0)
MCHC: 30.6 g/dL (ref 30.0–36.0)
MCV: 91.3 fL (ref 80.0–100.0)
Platelets: 99 10*3/uL — ABNORMAL LOW (ref 150–400)
RBC: 3.55 MIL/uL — ABNORMAL LOW (ref 4.22–5.81)
RDW: 13 % (ref 11.5–15.5)
WBC: 13.2 10*3/uL — ABNORMAL HIGH (ref 4.0–10.5)
nRBC: 0 % (ref 0.0–0.2)

## 2021-09-23 LAB — GLUCOSE, CAPILLARY
Glucose-Capillary: 135 mg/dL — ABNORMAL HIGH (ref 70–99)
Glucose-Capillary: 127 mg/dL — ABNORMAL HIGH (ref 70–99)
Glucose-Capillary: 186 mg/dL — ABNORMAL HIGH (ref 70–99)
Glucose-Capillary: 231 mg/dL — ABNORMAL HIGH (ref 70–99)
Glucose-Capillary: 172 mg/dL — ABNORMAL HIGH (ref 70–99)
Glucose-Capillary: 155 mg/dL — ABNORMAL HIGH (ref 70–99)

## 2021-09-23 LAB — BASIC METABOLIC PANEL
Anion gap: 7 (ref 5–15)
BUN: 30 mg/dL — ABNORMAL HIGH (ref 8–23)
CO2: 28 mmol/L (ref 22–32)
Calcium: 8.2 mg/dL — ABNORMAL LOW (ref 8.9–10.3)
Chloride: 99 mmol/L (ref 98–111)
Creatinine, Ser: 2.05 mg/dL — ABNORMAL HIGH (ref 0.61–1.24)
GFR, Estimated: 34 mL/min — ABNORMAL LOW (ref >60)
Glucose, Bld: 139 mg/dL — ABNORMAL HIGH (ref 70–99)
Potassium: 4 mmol/L (ref 3.5–5.1)
Sodium: 134 mmol/L — ABNORMAL LOW (ref 135–145)

## 2021-09-23 MED FILL — Heparin Sodium (Porcine) Inj 1000 Unit/ML: INTRAMUSCULAR | Qty: 10 | Status: AC

## 2021-09-23 MED FILL — Sodium Chloride IV Soln 0.9%: INTRAVENOUS | Qty: 2000 | Status: AC

## 2021-09-23 MED FILL — Calcium Chloride Inj 10%: INTRAVENOUS | Qty: 10 | Status: AC

## 2021-09-23 MED FILL — Mannitol IV Soln 20%: INTRAVENOUS | Qty: 500 | Status: AC

## 2021-09-23 MED FILL — Sodium Bicarbonate IV Soln 8.4%: INTRAVENOUS | Qty: 50 | Status: AC

## 2021-09-23 MED FILL — Electrolyte-R (PH 7.4) Solution: INTRAVENOUS | Qty: 4000 | Status: AC

## 2021-09-23 NOTE — Progress Notes (Signed)
° °   °  [address]       [place] [address]             [phone]                 2 Days Post-Op Procedure(s) (LRB): CORONARY ARTERY BYPASS GRAFTING (CABG)  X FOUR, ON PUMP, USING LEFT INTERNAL MAMMARY ARTERY AND RIGHT ENDOSCOPIC GREATER SAPHENOUS VEIN CONDUITS (N/A) TRANSESOPHAGEAL ECHOCARDIOGRAM (TEE) (N/A) APPLICATION OF CELL SAVER ENDOVEIN HARVEST OF GREATER SAPHENOUS VEIN   Events: No events Brady overnight.  Did not require pacing _______________________________________________________________ Vitals: BP 98/62    Pulse 64    Temp 97.8 F (36.6 C) (Oral)    Resp 18    Ht 5\' 5"  (1.651 m)    Wt 75.7 kg    SpO2 97%    BMI 27.77 kg/m  Filed Weights   09/21/21 0612 09/22/21 0500 09/23/21 0500  Weight: 72.6 kg 76.7 kg 75.7 kg     - Neuro: alert NAD  - Cardiovascular: sinus  Drips: none.   CVP:  [5 mmHg] 5 mmHg  - Pulm: EWOB   ABG    Component Value Date/Time   PHART 7.356 09/21/2021 2210   PCO2ART 40.6 09/21/2021 2210   PO2ART 102 09/21/2021 2210   HCO3 22.7 09/21/2021 2210   TCO2 24 09/21/2021 2210   ACIDBASEDEF 3.0 (H) 09/21/2021 2210   O2SAT 98.0 09/21/2021 2210    - Abd: ND - Extremity: warm  .Intake/Output      12/28 0701 12/29 0700 12/29 0701 12/30 0700   P.O.     I.V. (mL/kg) 631.9 (8.3)    Blood     IV Piggyback 300    Total Intake(mL/kg) 931.9 (12.3)    Urine (mL/kg/hr) 2475 (1.4)    Blood     Chest Tube 80    Total Output 2555    Net -1623.1            _______________________________________________________________ Labs: CBC Latest Ref Rng & Units 09/23/2021 09/22/2021 09/22/2021  WBC 4.0 - 10.5 K/uL 13.2(H) 14.3(H) 10.6(H)  Hemoglobin 13.0 - 17.0 g/dL 9.9(L) 10.3(L) 10.0(L)  Hematocrit 39.0 - 52.0 % 32.4(L) 32.9(L) 30.2(L)  Platelets 150 - 400 K/uL 99(L) 97(L) 87(L)   CMP Latest Ref Rng & Units 09/23/2021 09/22/2021 09/22/2021  Glucose 70 - 99 mg/dL 139(H) 172(H) 152(H)  BUN 8 - 23 mg/dL 30(H) 20 17   Creatinine 0.61 - 1.24 mg/dL 2.05(H) 1.34(H) 1.22  Sodium 135 - 145 mmol/L 134(L) 135 140  Potassium 3.5 - 5.1 mmol/L 4.0 4.0 4.1  Chloride 98 - 111 mmol/L 99 100 108  CO2 22 - 32 mmol/L 28 23 24   Calcium 8.9 - 10.3 mg/dL 8.2(L) 8.4(L) 7.9(L)  Total Protein 6.5 - 8.1 g/dL - - -  Total Bilirubin 0.3 - 1.2 mg/dL - - -  Alkaline Phos 38 - 126 U/L - - -  AST 15 - 41 U/L - - -  ALT 0 - 44 U/L - - -    CXR: PV congestion  _______________________________________________________________  Assessment and Plan: POD 2 s/p CABG  Neuro: pain controlled CV: holding BB.  On statin and aspirin.  Will keep wires for now Pulm: pulm toilet.  On 2L.  Will remove chest tubes Renal: creat up, will hold diuresis today GI: on diet Heme: stable ID: [email or web address] Endo: SSI Dispo: continue ICU care to watch creat.   Lajuana Matte 09/23/2021 7:45 AM

## 2021-09-23 NOTE — Progress Notes (Signed)
Barry [name], MRN:  [id number], DOB:  15-Aug-1951, LOS: 2 ADMISSION DATE:  09/21/2021, CONSULTATION DATE:  09/21/2021 REFERRING MD:  Dr. Kipp Brood, CHIEF COMPLAINT:  post CABG  History of Present Illness:   70 year old male who speaks [place], admitted on 12/27 for elective CABG by Dr. Kipp Brood.   Prior history of CAD with prior MI and PCI to the RCA and circumflex in 2015, HFrEF (06/2021 EF 40-45%, G1DD, slightly reduced RV function), HTN, carotid stenosis, HLD, CKD stage 3, CVA (2011), DM, and depression.  He had been having fatigue and anginal symptoms in which he was evaluated for in December 2022.  Underwent left heart cath on 08/31/21 which showed LAD, diagonal, and distal RCA disease and in-stent restenosis of his circumflex stent.  Underwent CABG x 4 (LIMA to LAD, reverse saphenous vein graft to PDA, first diagonal, and OM with endoscopic greater saphenous vein harvest on the right) on 12/27.   Pertinent  Medical History  CAD with prior MI and PCI 2015, HFrEF, HTN, carotid stenosis, HLD, CKD stage 3, CVA (2011), DM, depression  Significant Hospital Events: Including procedures, antibiotic start and stop dates in addition to other pertinent events   12/27 CABG x 4   Interim History / Subjective:  This morning Cr jump from 1.34 to 2.05 Remains off pressors   Objective   Blood pressure 98/62, pulse 64, temperature 98.3 F (36.8 C), temperature source Oral, resp. rate 18, height 5\' 5"  (1.651 m), weight 75.7 kg, SpO2 97 %. CVP:  [5 mmHg] 5 mmHg    Intake/Output Summary (Last 24 hours) at 09/23/2021 0709 Last data filed at 09/23/2021 0700 Gross per 24 hour  Intake 931.9 ml  Output 2555 ml  Net -1623.1 ml   Filed Weights   09/21/21 0612 09/22/21 0500 09/23/21 0500  Weight: 72.6 kg 76.7 kg 75.7 kg   Examination:  General: wdwn older adult M seated in recliner NAD  HEENT:NCAT pink mm  Neuro:  AAOx3 following commands  CV: rr. Chest tubes in place cap refill brisk =.  Pacing wires  PULM:  even unlabored. 1281ml on IS  GI: soft ndnt +bowel sounds  Extremities: no acute joint deformity  Skin: c/d/w no rash   Resolved Hospital Problem list    Assessment & Plan:     CAD s/p CABG x4 AKI on CKD 3 Hypoxia due to atelectasis, splinting  Thrombocytopenia, stable HTN HLD  DM P -chest tubes out.  -keep wires since HR borderline brady  -staying in ICU while Cr is monitored given interval bump.  -trend UOP renal indices  -IS, mobility, pulm hygiene  -ASA, statin, BID metop  -SSI    Best Practice (right click and "Reselect all SmartList Selections" daily)   Diet/type: Regular consistency (see orders) DVT prophylaxis: SCD GI prophylaxis: PPI Lines: Central line and yes and it is still needed Foley:  Yes, and it is no longer needed and removal ordered  Code Status:  full code Last date of multidisciplinary goals of care discussion [per TCTS] Wife & pt updated at bedside 12/29   Labs   CBC: Recent Labs  Lab 09/21/21 1254 09/21/21 1258 09/21/21 1802 09/21/21 2103 09/21/21 2210 09/22/21 0319 09/22/21 1621 09/23/21 0345  WBC 7.0  --  13.6*  --   --  10.6* 14.3* 13.2*  HGB 11.1*   < > 11.7* 10.5* 9.9* 10.0* 10.3* 9.9*  HCT 34.0*   < > 35.9* 31.0* 29.0* 30.2* 32.9* 32.4*  MCV 88.3  --  88.6  --   --  88.0 90.9 91.3  PLT 78*  --  97*  --   --  87* 97* 99*   < > = values in this interval not displayed.    Basic Metabolic Panel: Recent Labs  Lab 09/17/21 1013 09/21/21 0817 09/21/21 1145 09/21/21 1258 09/21/21 1802 09/21/21 2103 09/21/21 2210 09/22/21 0319 09/22/21 1621 09/23/21 0345  NA 136   < > 139   < > 138 142 142 140 135 134*  K 4.1   < > 4.3   < > 4.5 3.7 3.7 4.1 4.0 4.0  CL 102   < > 104  --  109  --   --  108 100 99  CO2 22  --   --   --  21*  --   --  24 23 28   GLUCOSE 333*   < > 84  --  133*  --   --  152* 172* 139*  BUN 21   < > 17  --  16  --   --  17 20 30*  CREATININE 1.34*   < > 0.90  --  1.30*  --   --  1.22  1.34* 2.05*  CALCIUM 9.2  --   --   --  8.3*  --   --  7.9* 8.4* 8.2*  MG  --   --   --   --  3.3*  --   --  2.3 2.2  --    < > = values in this interval not displayed.   GFR: Estimated Creatinine Clearance: 31.4 mL/min (A) (by C-G formula based on SCr of 2.05 mg/dL (H)). Recent Labs  Lab 09/21/21 1802 09/22/21 0319 09/22/21 1621 09/23/21 0345  WBC 13.6* 10.6* 14.3* 13.2*    Liver Function Tests: Recent Labs  Lab 09/17/21 1013  AST 19  ALT 22  ALKPHOS 78  BILITOT 0.7  PROT 7.2  ALBUMIN 4.0   No results for input(s): LIPASE, AMYLASE in the last 168 hours. No results for input(s): AMMONIA in the last 168 hours.  ABG    Component Value Date/Time   PHART 7.356 09/21/2021 2210   PCO2ART 40.6 09/21/2021 2210   PO2ART 102 09/21/2021 2210   HCO3 22.7 09/21/2021 2210   TCO2 24 09/21/2021 2210   ACIDBASEDEF 3.0 (H) 09/21/2021 2210   O2SAT 98.0 09/21/2021 2210     Coagulation Profile: Recent Labs  Lab 09/17/21 1013 09/21/21 1254  INR 0.9 1.4*    Cardiac Enzymes: No results for input(s): CKTOTAL, CKMB, CKMBINDEX, TROPONINI in the last 168 hours.  HbA1C: Hgb A1c MFr Bld  Date/Time Value Ref Range Status  09/17/2021 10:15 AM 8.9 (H) 4.8 - 5.6 % Final    Comment:    (NOTE) Pre diabetes:          5.7%-6.4%  Diabetes:              >6.4%  Glycemic control for   <7.0% adults with diabetes   06/07/2021 01:55 PM 8.7 (H) 4.6 - 6.5 % Final    Comment:    Glycemic Control Guidelines for People with Diabetes:Non Diabetic:  <6%Goal of Therapy: <7%Additional Action Suggested:  >8%     CBG: Recent Labs  Lab 09/22/21 1104 09/22/21 1613 09/22/21 2004 09/22/21 2314 09/23/21 0345  GLUCAP 153* 187* 154* 166* 135*     CRITICAL CARE Performed by: Barry [name]  CCT: n/a    Barry Gum MSN, AGACNP-BC [facility] Amion for pager  09/23/2021, 7:09 AM

## 2021-09-23 NOTE — Progress Notes (Signed)
Patient ID: Barry [name], male   DOB: 30-May-1951, 70 y.o.   MRN: [id number] TCTS Evening Rounds:  Hemodynamically stable in sinus rhythm 60's.   Sats 97% 2L  Creat bumped to 2 this morning but excellent uo today. Repeat in am.

## 2021-09-23 NOTE — Discharge Summary (Signed)
[address]       [place] [address]             [phone]    Physician Discharge Summary  Patient ID: Barry [name] MRN: [id number] DOB/AGE: [birth year] 70 y.o.  Admit date: 09/21/2021 Discharge date: 09/28/2021  Admission Diagnoses:  Patient Active Problem List   Diagnosis Date Noted   S/P CABG x 4 09/21/2021   Abnormal nuclear stress test 08/31/2021   PVC's (premature ventricular contractions) 07/21/2021   CKD (chronic kidney disease) stage 3, GFR 30-59 ml/min ([place]) 07/21/2021   HFmrEF (heart failure with mildly reduced EF) 09/2015   Atherosclerotic peripheral vascular disease with ulceration ([place]) 04/03/2015   Aneurysm of iliac artery (HCC) 03/18/2014   Abdominal pain, unspecified site 03/18/2014   Syncope in setting of nausea and vomiting 03/07/14 03/08/2014   Coronary artery disease 02/28/2014   Non compliance with medical treatment 02/28/2014   Dyslipidemia, goal LDL below 70 02/28/2014   Bradycardia- beta blocker decreased 02/28/2014   Chronic diastolic CHF (congestive heart failure) ([place]) 02/28/2014   Type 2 diabetes mellitus with manifestations ([place]) 02/27/2014   TIA (transient ischemic attack) 02/26/2014   History of non-ST elevation myocardial infarction (NSTEMI) 02/26/2014   Weakness-Left arm / Lef 12/06/2013   Mesenteric artery stenosis ([place]) 12/06/2013   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[place]) 12/03/2012   Hypertension 11/10/2011   History of CVA in adulthood 11/10/2011   Language barrier 11/10/2011     Discharge Diagnoses:  Patient Active Problem List   Diagnosis Date Noted   S/P CABG x 4 09/21/2021   Abnormal nuclear stress test 08/31/2021   PVC's (premature ventricular contractions) 07/21/2021   CKD (chronic kidney disease) stage 3, GFR 30-59 ml/min ([place]) 07/21/2021   HFmrEF (heart failure with mildly reduced EF) 09/2015   Atherosclerotic peripheral vascular disease with ulceration ([place]) 04/03/2015   Aneurysm of iliac artery ([place])  03/18/2014   Abdominal pain, unspecified site 03/18/2014   Syncope in setting of nausea and vomiting 03/07/14 03/08/2014   Coronary artery disease 02/28/2014   Non compliance with medical treatment 02/28/2014   Dyslipidemia, goal LDL below 70 02/28/2014   Bradycardia- beta blocker decreased 02/28/2014   Chronic diastolic CHF (congestive heart failure) ([place]) 02/28/2014   Type 2 diabetes mellitus with manifestations ([place]) 02/27/2014   TIA (transient ischemic attack) 02/26/2014   History of non-ST elevation myocardial infarction (NSTEMI) 02/26/2014   Weakness-Left arm / Lef 12/06/2013   Mesenteric artery stenosis ([place]) 12/06/2013   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 Tamala Julian for surgical evaluation of three-vessel coronary artery disease.  He does have a history of PCI in 2015 with stent placement to the RCA and circumflex system.  Most recent left heart cath in 2016 showed that the stents were patent.  He has been having some anginal symptoms and has come to the emergency department for evaluation of this.  He recently underwent another left heart cath on 08/31/2021 which showed LAD, diagonal, and distal RCA disease.  He has an in-stent restenosis of his circumflex stent.   He denies any chest pain or shortness of breath but admits to fatigability.  He was referred to Dr. Kipp Brood who evaluated the patient and all relevant studies and recommended proceeding with coronary artery  surgical revascularization.  He was admitted this hospitalization for the procedure.  Hospital course:  The patient was admitted electively and taken to the operating room on 09/21/2021 at which time he underwent CABG x4.  He tolerated the procedure well was taken to the surgical intensive care unit in stable  condition.  Postoperative hospital course:  The patient is doing well.  He was extubated using standard post cardiac surgical protocols without difficulty.  He is noted to have some expected postoperative volume overload and has been given a course of diuretics.  He has an expected acute blood loss anemia which is stable.  All routine lines, monitors and drainage devices have been discontinued in the standard fashion.  On postop day #2 patient developed a significant ileus with nausea and vomiting.  NG tube was attempted without success.  Ambulation was encouraged and narcotics were limited as able.  Over time the ileus resolved and he was having normal bowel movements and no further abdominal pain/nausea or vomiting the patient did have an acute kidney injury with peak creatinine of 2.05 but it has been improving over time.  At the time of discharge his creatinine was 1.50.  He is undergoing a routine course of cardiac rehab and pulmonary hygiene.  All incisions were noted to be healing well without evidence of infection.  He was tolerating diet.  Oxygen was weaned off and he maintains good oxygen saturations on room air.  He is tolerating routine cardiac rehab modalities at the time of discharge the patient was felt to be quite stable.  Consults: None  Significant Diagnostic Studies:  DG Chest 2 View  Result Date: 09/26/2021 CLINICAL DATA:  CABG surgery on 09/21/2021.  Follow-up exam. EXAM: CHEST - 2 VIEW COMPARISON:  09/23/2021 and older studies. FINDINGS: All lines and tubes have been removed. Left lung base opacity, mildly improved, consistent with atelectasis with a probable small effusion. Mild atelectasis at the medial right lung base, stable. Remainder of the lungs is clear. No mediastinal widening. No pneumothorax. IMPRESSION: 1. No acute findings or evidence of an operative complication. 2. Mild improvement in the left lung base opacity consistent with improved atelectasis/decreased pleural  fluid. Electronically Signed   By: Lajean Manes M.D.   On: 09/26/2021 09:30   DG Chest 2 View  Result Date: 09/17/2021 CLINICAL DATA:  Preop CABG EXAM: CHEST - 2 VIEW COMPARISON:  06/10/2021 FINDINGS: The heart size and mediastinal contours are within normal limits. Both lungs are clear. The visualized skeletal structures are unremarkable. IMPRESSION: No active cardiopulmonary disease. Electronically Signed   By: Kathreen Devoid M.D.   On: 09/17/2021 16:18   DG Abd 1 View  Result Date: 09/23/2021 CLINICAL DATA:  Ileus. EXAM: ABDOMEN - 1 VIEW COMPARISON:  One view chest x-ray 09/23/2021 at 5:09 a.m. FINDINGS: Epicardial pacing wires noted.  Left-sided chest tube in place. Diffuse gaseous distension of large and small bowel loops noted. IMPRESSION: Diffuse gaseous distension of large and small bowel loops compatible with ileus. Electronically Signed   By: San Morelle M.D.   On: 09/23/2021 15:48   CARDIAC CATHETERIZATION  Result Date: 08/31/2021 CONCLUSIONS: Severe mid LAD disease.  Severe first diagonal disease. Total occlusion of the proximal to mid circumflex after the first obtuse marginal within a previously placed stent.  Circumflex has left to left collaterals. Right coronary contains a proximal stent with 40% ISR, distal RCA contains 60 to 70% stenosis, continuation of RCA contains segmental 50% stenosis, a small third left ventricular branch  contains 75% ostial to proximal stenosis. Diffuse 30% mid to distal left main narrowing Left ventricular dysfunction with anteroapical severe hypokinesis.  LVEF 40%.  LVEDP is normal. RECOMMENDATION: 70 year old diabetic with total occlusion due to in-stent restenosis in the circumflex, severe disease in the proximal to mid LAD and first diagonal with moderate disease in the right coronary.  Given decreased LV function, coronary artery surgical revascularization is indicated.  We will arrange an appointment with the surgical team within the next week  if possible.   DG Chest Port 1 View  Result Date: 09/23/2021 CLINICAL DATA:  Chest tube, open heart surgery, sore chest EXAM: PORTABLE CHEST 1 VIEW COMPARISON:  Chest radiograph 1 day prior FINDINGS: The right IJ vascular catheter is stable terminating in the mid SVC. Median sternotomy wires are unchanged. Mediastinal and left basilar chest tubes are stable. The cardiomediastinal silhouette is stable. There is a small left pleural effusion with patchy retrocardiac opacity, not significantly changed. The right lung is clear. There is no significant right effusion. There is no appreciable pneumothorax. The bones are stable. IMPRESSION: No significant interval change since the study from 1 day prior. Stable small left pleural effusion with adjacent retrocardiac opacity. No appreciable pneumothorax. Electronically Signed   By: Valetta Mole M.D.   On: 09/23/2021 08:32   DG Chest Port 1 View  Result Date: 09/22/2021 CLINICAL DATA:  Chest soreness following recent CABG. EXAM: PORTABLE CHEST 1 VIEW COMPARISON:  Yesterday. FINDINGS: The endotracheal and nasogastric tubes have been removed. A right jugular catheter remains in place with its tip in the superior vena cava. Poor inspiration with grossly stable enlarged cardiac silhouette and mildly prominent interstitial markings. Increased left basilar atelectasis. Unremarkable bones. Stable post CABG changes. IMPRESSION: Poor inspiration with increased left basilar atelectasis. Electronically Signed   By: Claudie Revering M.D.   On: 09/22/2021 08:20   DG Chest Port 1 View  Result Date: 09/21/2021 CLINICAL DATA:  Status post CABG EXAM: PORTABLE CHEST 1 VIEW COMPARISON:  09/17/2021 FINDINGS: Endotracheal tube with the tip 5.2 cm above the carina. Right jugular central venous catheter with the tip projecting over the SVC. Nasogastric tube coursing below the diaphragm. Mediastinal drain and left-sided chest tube in satisfactory position. Mild left basilar atelectasis.  No pleural effusion or pneumothorax. Heart and mediastinal contours are unremarkable. Interval CABG. No acute osseous abnormality. IMPRESSION: Support lines and tubing in satisfactory position. Electronically Signed   By: Kathreen Devoid M.D.   On: 09/21/2021 13:00   DG ABD ACUTE 2+V W 1V CHEST  Result Date: 09/25/2021 CLINICAL DATA:  Abdominal tenderness. EXAM: DG ABDOMEN ACUTE WITH 1 VIEW CHEST COMPARISON:  09/23/2021 FINDINGS: Similar appearance of diffuse gaseous distension of the large and small bowel loops concerning for ileus. No signs of free air. Right IJ catheter is noted with tip projecting over the SVC. Stable cardiomediastinal contours. Small left pleural effusion noted. IMPRESSION: 1. Stable appearance of diffuse gaseous distension of the large and small bowel loops concerning for ileus. 2. Small left pleural effusion. Electronically Signed   By: Kerby Moors M.D.   On: 09/25/2021 07:37   ECHO INTRAOPERATIVE TEE  Result Date: 09/21/2021  *INTRAOPERATIVE TRANSESOPHAGEAL REPORT *  Patient Name:   RAJOHN HENERY  Date of Exam: 09/21/2021 Medical Rec #:  [id number]  Height:       65.0 in Accession #:    [id number] Weight:       160.0 lb Date of Birth:  1950-12-23   BSA:  1.80 m Patient Age:    62 years   BP:           126/58 mmHg Patient Gender: M          HR:           59 bpm. Exam Location:  Inpatient Transesophogeal exam was perform intraoperatively during surgical procedure. Patient was closely monitored under general anesthesia during the entirety of examination. Indications:     coronary artery bypass Performing Phys: [id number] Lucile Crater LIGHTFOOT Diagnosing Phys: Nolon Nations MD Complications: No known complications during this procedure. POST-OP IMPRESSIONS _ Left Ventricle: Systolic function slightly improved globally. EF 45-50%. _ Right Ventricle: The right ventricle appears unchanged from pre-bypass. _ Aorta: The aorta appears unchanged from pre-bypass. _ Left Atrial Appendage: The  left atrial appendage appears unchanged from pre-bypass. _ Aortic Valve: The aortic valve appears unchanged from pre-bypass. _ Mitral Valve: The mitral valve appears unchanged from pre-bypass. _ Tricuspid Valve: The tricuspid valve appears unchanged from pre-bypass. _ Pulmonic Valve: The pulmonic valve appears unchanged from pre-bypass. PRE-OP FINDINGS  Left Ventricle: The left ventricle has mild-moderately reduced systolic function, with an ejection fraction of 40-45%. The cavity size was normal. Concentric left ventricular hypertrophy. Right Ventricle: The right ventricle has normal systolic function. The cavity was normal. There is no increase in right ventricular wall thickness. Left Atrium: Left atrial size was dilated. No left atrial/left atrial appendage thrombus was detected. The left atrial appendage is well visualized and there is no evidence of thrombus present. Right Atrium: Right atrial size was normal in size. Interatrial Septum: No atrial level shunt detected by color flow Doppler. There is no evidence of a patent foramen ovale. Pericardium: There is no evidence of pericardial effusion. Mitral Valve: The mitral valve is normal in structure. Mitral valve regurgitation is mild by color flow Doppler. The MR jet is centrally-directed. There is no evidence of mitral valve vegetation. Tricuspid Valve: The tricuspid valve was normal in structure. Tricuspid valve regurgitation is moderate by color flow Doppler. Aortic Valve: The aortic valve is tricuspid Aortic valve regurgitation is trivial by color flow Doppler. There is no stenosis of the aortic valve. Pulmonic Valve: The pulmonic valve was normal in structure. Pulmonic valve regurgitation is trivial by color flow Doppler. Aorta: The aortic root and ascending aorta are normal in size and structure. There is evidence of plaque in the descending aorta; Grade IV, measuring >33m in size. Shunts: There is no evidence of an atrial septal defect.  +-------------+-----------++  AORTIC VALVE                +-------------+-----------++  AV Vmax:      99.00 cm/s    +-------------+-----------++  AV Vmean:     66.800 cm/s   +-------------+-----------++  AV VTI:       0.277 m       +-------------+-----------++  AV Peak Grad: 3.9 mmHg      +-------------+-----------++  AV Mean Grad: 2.0 mmHg      +-------------+-----------++  JNolon NationsMD Electronically signed by JNolon NationsMD Signature Date/Time: 09/21/2021/5:37:32 PM    Final    VAS [place] PRE CABG  Result Date: 09/17/2021 PREOPERATIVE VASCULAR EVALUATION Patient Name:  SKYANDRE OKRAY Date of Exam:   09/17/2021 Medical Rec #: [id number] Accession #:    [id number] of Birth: [birth year]  Patient Gender: M Patient Age:   89years [facility] PRE CABG Referring Phys: HARRELL LIGHTFOOT --------------------------------------------------------------------------------  Indications:      Pre-CABG. Risk Factors:     Hypertension, hyperlipidemia, Diabetes, past history of                   smoking, prior MI, coronary artery disease, prior CVA. Other Factors:    CKD3, CHF, TIA. Comparison Study: Previous exam 02/28/14 bilateral 1-39% ICA stenosis Performing Technologist: Rogelia Rohrer RVT, RDMS  Examination Guidelines: A complete evaluation includes B-mode imaging, spectral Doppler, color Doppler, and power Doppler as needed of all accessible portions of each vessel. Bilateral testing is considered an integral part of a complete examination. Limited examinations for reoccurring indications may be performed as noted.  Right Carotid Findings: +----------+--------+-------+--------+----------------------+------------------+             PSV cm/s EDV     Stenosis Describe               Comments                                 cm/s                                                        +----------+--------+-------+--------+----------------------+------------------+   CCA Prox   53       7                heterogenous           intimal thickening  +----------+--------+-------+--------+----------------------+------------------+  CCA Distal 65       8                heterogenous and       intimal thickening                                        smooth                                     +----------+--------+-------+--------+----------------------+------------------+  ICA Prox   45       13                                                          +----------+--------+-------+--------+----------------------+------------------+  ICA Distal 42       14                                                          +----------+--------+-------+--------+----------------------+------------------+  ECA        81       4                                                           +----------+--------+-------+--------+----------------------+------------------+ +----------+--------+-------+-------------------+------------+  PSV cm/s EDV cms Describe            Arm Pressure  +----------+--------+-------+-------------------+------------+  Subclavian 218              elevated velocities               +----------+--------+-------+-------------------+------------+ +---------+--------+--+--------+--+---------+  Vertebral PSV cm/s 30 EDV cm/s 11 Antegrade  +---------+--------+--+--------+--+---------+ Left Carotid Findings: +----------+--------+--------+--------+------------+------------------+             PSV cm/s EDV cm/s Stenosis Describe     Comments            +----------+--------+--------+--------+------------+------------------+  CCA Prox   102      20                                                 +----------+--------+--------+--------+------------+------------------+  CCA Distal 73       20                heterogenous intimal thickening  +----------+--------+--------+--------+------------+------------------+  ICA Prox   51       17                                                  +----------+--------+--------+--------+------------+------------------+  ICA Distal 55       17                                                 +----------+--------+--------+--------+------------+------------------+  ECA        68       6                                                  +----------+--------+--------+--------+------------+------------------+  +----------+--------+--------+----------------+------------+  Subclavian PSV cm/s EDV cm/s Describe         Arm Pressure  +----------+--------+--------+----------------+------------+             122               Multiphasic, WNL               +----------+--------+--------+----------------+------------+ +---------+--------+--+--------+-+---------+  Vertebral PSV cm/s 21 EDV cm/s 6 Antegrade  +---------+--------+--+--------+-+---------+  ABI Findings: +---------+------------------+-----+---------+--------+  Right     Rt Pressure (mmHg) Index Waveform  Comment   +---------+------------------+-----+---------+--------+  Brachial  153                      triphasic           +---------+------------------+-----+---------+--------+  PTA       171                1.12  biphasic            +---------+------------------+-----+---------+--------+  DP        155                1.01  triphasic           +---------+------------------+-----+---------+--------+  [place]  Toe 120                0.78  Normal              +---------+------------------+-----+---------+--------+ +---------+------------------+-----+---------+-------+  Left      Lt Pressure (mmHg) Index Waveform  Comment  +---------+------------------+-----+---------+-------+  Brachial  141                      triphasic          +---------+------------------+-----+---------+-------+  PTA       172                1.12  biphasic           +---------+------------------+-----+---------+-------+  DP        157                1.03  biphasic           +---------+------------------+-----+---------+-------+  Great Toe 126                 0.82  Normal             +---------+------------------+-----+---------+-------+ +-------+---------------+----------------+  ABI/TBI Today's ABI/TBI Previous ABI/TBI  +-------+---------------+----------------+  Right   1.12 / 0.78                       +-------+---------------+----------------+  Left    1.12 / 0.82                       +-------+---------------+----------------+  Right Doppler Findings: +----------+--------+-----+---------+--------+  Site       Pressure Index Doppler   Comments  +----------+--------+-----+---------+--------+  Subclavian 153            triphasic           +----------+--------+-----+---------+--------+  Brachial   153            triphasic           +----------+--------+-----+---------+--------+  Radial                    triphasic           +----------+--------+-----+---------+--------+  Ulnar                     triphasic           +----------+--------+-----+---------+--------+  Left Doppler Findings: +----------+--------+-----+---------+--------+  Site       Pressure Index Doppler   Comments  +----------+--------+-----+---------+--------+  Subclavian 141            triphasic           +----------+--------+-----+---------+--------+  Brachial   141            triphasic           +----------+--------+-----+---------+--------+  Radial                    triphasic           +----------+--------+-----+---------+--------+  Ulnar                     triphasic           +----------+--------+-----+---------+--------+  Summary: Right Carotid: The extracranial vessels were near-normal with only minimal wall                thickening or plaque. Left Carotid: The extracranial vessels were near-normal with only minimal wall  thickening or plaque. Vertebrals:  Bilateral vertebral arteries demonstrate antegrade flow. Subclavians: Normal flow hemodynamics were seen in the left subclavian artery.              Elevated velocities seen in the right subclavian artery. Right ABI:  Resting right ankle-brachial index is within normal range. No evidence of significant right lower extremity arterial disease. The right toe-brachial index is normal. Left ABI: Resting left ankle-brachial index is within normal range. No evidence of significant left lower extremity arterial disease. The left toe-brachial index is normal. Bilateral Extremity: Doppler waveforms remain within normal limits with compression bilaterally for the radial arteries. Doppler waveforms remain within normal limits with compression bilaterally for the ulnar arteries.  Electronically signed by Jamelle Haring on 09/17/2021 at 2:43:04 PM.    Final      Treatments: surgery:    09/21/2021 Patient:  Satterly Nollie Pre-Op Dx: Coronary artery disease                         Diabetes mellitus                         Chronic renal insufficiency                         Hypertension                         Hyperlipidemia Post-op Dx: Same Procedure: CABG X 4.  LIMA to LAD.  Reverse saphenous vein graft to PDA, first diagonal, and OM. Endoscopic greater saphenous vein harvest on the right     Surgeon and Role:      * Lightfoot, Lucile Crater, MD - Primary    Evonnie Pat, PA-C- assisting An experienced assistant was required given the complexity of this surgery and the standard of surgical care. The assistant was needed for exposure, dissection, suctioning, retraction of delicate tissues and sutures, instrument exchange and for overall help during this procedure.      Discharge Exam: Blood pressure 121/65, pulse 69, temperature 98.5 F (36.9 C), temperature source Oral, resp. rate 16, height _0  (1.651 m), weight 71.3 kg, SpO2 95 %.   General appearance: alert, cooperative, and no distress Heart: regular rate and rhythm Lungs: clear to auscultation bilaterally Abdomen: benign Extremities: no edema Wound: incis healing well Discharge Medications:  The patient has been discharged on:   1.Beta Blocker:  Yes [  y ]                               No   [   ]                              If No, reason:  2.Ace Inhibitor/ARB: Yes [   ]                                     No  [  n  ]                                     If No, reason:renal insuff  3.Statin:  Yes [  y ]                  No  [   ]                  If No, reason:  4.Ecasa:  Yes  [  y ]                  No   [   ]                  If No, reason:  Patient had ACS upon admission:  Plavix/P2Y12 inhibitor: Yes [   ]                                      No  [ n  ]     Discharge Instructions     Amb Referral to Cardiac Rehabilitation   Complete by: As directed    Diagnosis: CABG   CABG X ___: 4   After initial evaluation and assessments completed: Virtual Based Care may be provided alone or in conjunction with Phase 2 Cardiac Rehab based on patient barriers.: Yes   Discharge patient   Complete by: As directed    Discharge disposition: 01-Home or Self Care   Discharge patient date: 09/28/2021      Allergies as of 09/28/2021       Reactions   Metformin And Related    Pt feels that this causes constipation and will not take         Medication List     STOP taking these medications    isosorbide mononitrate 60 MG 24 hr tablet Commonly known as: IMDUR   lisinopril 20 MG tablet Commonly known as: ZESTRIL   metoprolol succinate 50 MG 24 hr tablet Commonly known as: TOPROL-XL       TAKE these medications    acetaminophen 325 MG tablet Commonly known as: TYLENOL Take 2 tablets (650 mg total) by mouth every 6 (six) hours as needed for mild pain, fever or headache.   aspirin 325 MG EC tablet Take 1 tablet (325 mg total) by mouth daily. What changed:  medication strength how much to take   blood glucose meter kit and supplies Dispense based on patient and insurance preference. Use up to four times daily as directed. (FOR ICD-10 E10.9, E11.9).   gabapentin 300 MG capsule Commonly known as: NEURONTIN Take 300 mg by mouth 3  (three) times daily.   insulin glargine 100 UNIT/ML injection Commonly known as: LANTUS Inject 0.22-0.27 mLs (22-27 Units total) into the skin daily. 22-"normal blood sugar reading" 27-"elevated blood sugar readings"   metoprolol tartrate 25 MG tablet Commonly known as: LOPRESSOR Take 1 tablet (25 mg total) by mouth 2 (two) times daily.   OneTouch Delica Plus [id number] Misc SMARTSIG:1 Topical Daily   OneTouch Ultra test strip Generic drug: glucose blood USE 1 STRIP TO CHECK GLUCOSE TWICE DAILY .   oxyCODONE 5 MG immediate release tablet Commonly known as: Oxy IR/ROXICODONE Take 1 tablet (5 mg total) by mouth every 6 (six) hours as needed for up to 7 days for severe pain.   rosuvastatin 40 MG tablet Commonly known as: CRESTOR Take 1 tablet by mouth once daily   TechLite Pen Needles 32G X 8 MM Misc Generic drug: Insulin Pen Needle Use daily to administer insulin  Follow-up Information     Lightfoot, Lucile Crater, MD Follow up.   Specialty: Cardiothoracic Surgery Why: A 1 week virtual visit will be arranged.  Please see discharge paperwork appointment.  Surgeon will call you Krahn do not go to the office.  Future appointment will be in person. Contact information: [address] [place] [address] [phone]         Sherren Mocha, MD Follow up.   Specialty: Cardiology Why: See discharge paperwork for follow-up appointment with cardiology Contact information: [address] [address] [phone]                 Signed: John Giovanni, PA-C  09/28/2021, 10:41 AM

## 2021-09-24 ENCOUNTER — Other Ambulatory Visit: Payer: Self-pay

## 2021-09-24 DIAGNOSIS — Z951 Presence of aortocoronary bypass graft: Secondary | ICD-10-CM | POA: Diagnosis not present

## 2021-09-24 LAB — GLUCOSE, CAPILLARY
Glucose-Capillary: 202 mg/dL — ABNORMAL HIGH (ref 70–99)
Glucose-Capillary: 161 mg/dL — ABNORMAL HIGH (ref 70–99)
Glucose-Capillary: 142 mg/dL — ABNORMAL HIGH (ref 70–99)
Glucose-Capillary: 184 mg/dL — ABNORMAL HIGH (ref 70–99)
Glucose-Capillary: 211 mg/dL — ABNORMAL HIGH (ref 70–99)

## 2021-09-24 LAB — BASIC METABOLIC PANEL
Anion gap: 7 (ref 5–15)
BUN: 22 mg/dL (ref 8–23)
CO2: 28 mmol/L (ref 22–32)
Calcium: 8.6 mg/dL — ABNORMAL LOW (ref 8.9–10.3)
Chloride: 103 mmol/L (ref 98–111)
Creatinine, Ser: 1.48 mg/dL — ABNORMAL HIGH (ref 0.61–1.24)
GFR, Estimated: 50 mL/min — ABNORMAL LOW (ref >60)
Glucose, Bld: 216 mg/dL — ABNORMAL HIGH (ref 70–99)
Potassium: 3.9 mmol/L (ref 3.5–5.1)
Sodium: 138 mmol/L (ref 135–145)

## 2021-09-24 MED ORDER — OXYMETAZOLINE HCL 0.05 % NA SOLN
1.0000 | Freq: Two times a day (BID) | NASAL | Status: AC
  Administered 2021-09-24 – 2021-09-26 (×5): 1 via NASAL
  Filled 2021-09-24: qty 30

## 2021-09-24 MED ORDER — METOCLOPRAMIDE HCL 5 MG/ML IJ SOLN
5.0000 mg | Freq: Four times a day (QID) | INTRAMUSCULAR | Status: DC | PRN
  Administered 2021-09-24 (×2): 5 mg via INTRAVENOUS
  Filled 2021-09-24 (×2): qty 2

## 2021-09-24 MED ORDER — LORAZEPAM 2 MG/ML IJ SOLN: 2.0000 mg | Freq: Once | INTRAMUSCULAR | Status: AC

## 2021-09-24 MED ORDER — LORAZEPAM 2 MG/ML IJ SOLN
INTRAMUSCULAR | Status: AC
  Administered 2021-09-24: 10:00:00 2 mg via INTRAVENOUS
  Filled 2021-09-24: qty 1

## 2021-09-24 MED ORDER — PANTOPRAZOLE SODIUM 40 MG IV SOLR
40.0000 mg | INTRAVENOUS | Status: DC
Start: 2021-09-24 — End: 2021-09-26
  Administered 2021-09-24 – 2021-09-26 (×3): 40 mg via INTRAVENOUS
  Filled 2021-09-24 (×3): qty 40

## 2021-09-24 MED ORDER — SODIUM CHLORIDE 0.9 % IV SOLN
12.5000 mg | Freq: Once | INTRAVENOUS | Status: AC
  Administered 2021-09-24: 05:00:00 12.5 mg via INTRAVENOUS
  Filled 2021-09-24: qty 0.5

## 2021-09-24 MED ORDER — DEXTROSE IN LACTATED RINGERS 5 % IV SOLN
INTRAVENOUS | Status: DC
Start: 2021-09-24 — End: 2021-09-25

## 2021-09-24 MED ORDER — ASPIRIN 300 MG RE SUPP
300.0000 mg | Freq: Every day | RECTAL | Status: DC
  Administered 2021-09-24: 12:00:00 300 mg via RECTAL
  Filled 2021-09-24: qty 1

## 2021-09-24 MED ORDER — OXYMETAZOLINE HCL 0.05 % NA SOLN
1.0000 | Freq: Two times a day (BID) | NASAL | Status: DC | PRN
  Filled 2021-09-24: qty 30

## 2021-09-24 MED ORDER — ACETAMINOPHEN 650 MG RE SUPP
650.0000 mg | Freq: Four times a day (QID) | RECTAL | Status: DC
  Administered 2021-09-24: 12:00:00 650 mg via RECTAL
  Filled 2021-09-24: qty 1

## 2021-09-24 NOTE — Plan of Care (Signed)
°  Problem: Education: Goal: Will demonstrate proper wound care and an understanding of methods to prevent future damage Outcome: Progressing Goal: Knowledge of disease or condition will improve Outcome: Progressing Goal: Knowledge of the prescribed therapeutic regimen will improve Outcome: Progressing Goal: Individualized Educational Video(s) Outcome: Progressing   Problem: Activity: Goal: Risk for activity intolerance will decrease Outcome: Progressing   Problem: Cardiac: Goal: Will achieve and/or maintain hemodynamic stability Outcome: Progressing   Problem: Clinical Measurements: Goal: Postoperative complications will be avoided or minimized Outcome: Progressing   Problem: Skin Integrity: Goal: Wound healing without signs and symptoms of infection Outcome: Progressing Goal: Risk for impaired skin integrity will decrease Outcome: Progressing   Problem: Urinary Elimination: Goal: Ability to achieve and maintain adequate renal perfusion and functioning will improve Outcome: Progressing   Problem: Clinical Measurements: Goal: Ability to maintain clinical measurements within normal limits will improve Outcome: Progressing Goal: Will remain free from infection Outcome: Progressing Goal: Diagnostic test results will improve Outcome: Progressing Goal: Respiratory complications will improve Outcome: Progressing Goal: Cardiovascular complication will be avoided Outcome: Progressing   Problem: Nutrition: Goal: Adequate nutrition will be maintained Outcome: Progressing   Problem: Activity: Goal: Risk for activity intolerance will decrease Outcome: Progressing   Problem: Coping: Goal: Level of anxiety will decrease Outcome: Progressing   Problem: Elimination: Goal: Will not experience complications related to bowel motility Outcome: Progressing Goal: Will not experience complications related to urinary retention Outcome: Progressing   Problem: Pain  Managment: Goal: General experience of comfort will improve Outcome: Progressing   Problem: Safety: Goal: Ability to remain free from injury will improve Outcome: Progressing

## 2021-09-24 NOTE — Progress Notes (Signed)
° °   °  [address]       [place] [address]             [phone]    POD # 3 CABG  BP 114/74 (BP Location: Right Arm)    Pulse (!) 58    Temp 99.6 F (37.6 C) (Oral)    Resp 20    Ht 5\' 5"  (1.651 m)    Wt 74.3 kg    SpO2 90%    BMI 27.26 kg/m    Intake/Output Summary (Last 24 hours) at 09/24/2021 1754 Last data filed at 09/24/2021 0300 Gross per 24 hour  Intake 120 ml  Output 560 ml  Net -440 ml   Resting comfortably No nausea, no further emesis Abdomen is soft, nontender Ambulated well  Remo Lipps C. Roxan Hockey, MD [facility] [phone]

## 2021-09-24 NOTE — Progress Notes (Addendum)
[address]       [place] [address]             [phone]      3 Days Post-Op Procedure(s) (LRB): CORONARY ARTERY BYPASS GRAFTING (CABG)  X FOUR, ON PUMP, USING LEFT INTERNAL MAMMARY ARTERY AND RIGHT ENDOSCOPIC GREATER SAPHENOUS VEIN CONDUITS (N/A) TRANSESOPHAGEAL ECHOCARDIOGRAM (TEE) (N/A) APPLICATION OF CELL SAVER ENDOVEIN HARVEST OF GREATER SAPHENOUS VEIN Subjective: Nausea and vomiting overnight, c/o abdominal tenderness  Objective: Vital signs in last 24 hours: Temp:  [98.2 F (36.8 C)-99.1 F (37.3 C)] 99.1 F (37.3 C) (12/30 0737) Pulse Rate:  [58] 58 (12/29 0945) Cardiac Rhythm: Normal sinus rhythm (12/30 0600) Resp:  [6-26] 11 (12/30 0600) BP: (105-155)/(34-127) 147/93 (12/30 0600) SpO2:  [90 %-100 %] 94 % (12/30 0600) Weight:  [74.3 kg] 74.3 kg (12/30 0600)  Hemodynamic parameters for last 24 hours: CVP:  [5 mmHg] 5 mmHg  Intake/Output from previous day: 12/29 0701 - 12/30 0700 In: 300 [P.O.:120; I.V.:180] Out: 2160 [Urine:2100; Emesis/NG output:60] Intake/Output this shift: No intake/output data recorded.  General appearance: fatigued and appears uncomfortable Heart: regular rate and rhythm Lungs: min dim in bases Abdomen: mod distension, + Tenderness to palp , scant BS Extremities: no edema Wound: EVH site ok, chest dressing in place  Lab Results: Recent Labs    09/22/21 1621 09/23/21 0345  WBC 14.3* 13.2*  HGB 10.3* 9.9*  HCT 32.9* 32.4*  PLT 97* 99*   BMET:  Recent Labs    09/23/21 0345 09/24/21 0333  NA 134* 138  K 4.0 3.9  CL 99 103  CO2 28 28  GLUCOSE 139* 216*  BUN 30* 22  CREATININE 2.05* 1.48*  CALCIUM 8.2* 8.6*    PT/INR:  Recent Labs    09/21/21 1254  LABPROT 17.2*  INR 1.4*   ABG    Component Value Date/Time   PHART 7.356 09/21/2021 2210   HCO3 22.7 09/21/2021 2210   TCO2 24 09/21/2021 2210   ACIDBASEDEF 3.0 (H) 09/21/2021 2210   O2SAT 98.0 09/21/2021 2210   CBG (last 3)  Recent  Labs    09/23/21 2316 09/24/21 0318 09/24/21 0734  GLUCAP 155* 202* 161*    Meds Scheduled Meds:  acetaminophen  1,000 mg Oral Q6H   Or   acetaminophen (TYLENOL) oral liquid 160 mg/5 mL  1,000 mg Per Tube Q6H   aspirin EC  325 mg Oral Daily   Or   aspirin  324 mg Per Tube Daily   bisacodyl  10 mg Oral Daily   Or   bisacodyl  10 mg Rectal Daily   Chlorhexidine Gluconate Cloth  6 each Topical Q0600   docusate sodium  200 mg Oral Daily   enoxaparin (LOVENOX) injection  40 mg Subcutaneous QHS   insulin aspart  0-24 Units Subcutaneous Q4H   metoprolol tartrate  25 mg Oral BID   pantoprazole  40 mg Oral Daily   rosuvastatin  40 mg Oral Daily   sodium chloride flush  3 mL Intravenous Q12H   Continuous Infusions:  sodium chloride     sodium chloride     sodium chloride     lactated ringers     lactated ringers Stopped (09/22/21 2126)   lactated ringers     PRN Meds:.sodium chloride, dextrose, hydrALAZINE, lactated ringers, metoprolol tartrate, morphine injection, ondansetron (ZOFRAN) IV, oxyCODONE, sodium chloride flush, sodium chloride flush, traMADol  Xrays DG Abd 1 View  Result Date: 09/23/2021 CLINICAL  DATA:  Ileus. EXAM: ABDOMEN - 1 VIEW COMPARISON:  One view chest x-ray 09/23/2021 at 5:09 a.m. FINDINGS: Epicardial pacing wires noted.  Left-sided chest tube in place. Diffuse gaseous distension of large and small bowel loops noted. IMPRESSION: Diffuse gaseous distension of large and small bowel loops compatible with ileus. Electronically Signed   By: San Morelle M.D.   On: 09/23/2021 15:48   DG Chest Port 1 View  Result Date: 09/23/2021 CLINICAL DATA:  Chest tube, open heart surgery, sore chest EXAM: PORTABLE CHEST 1 VIEW COMPARISON:  Chest radiograph 1 day prior FINDINGS: The right IJ vascular catheter is stable terminating in the mid SVC. Median sternotomy wires are unchanged. Mediastinal and left basilar chest tubes are stable. The cardiomediastinal silhouette  is stable. There is a small left pleural effusion with patchy retrocardiac opacity, not significantly changed. The right lung is clear. There is no significant right effusion. There is no appreciable pneumothorax. The bones are stable. IMPRESSION: No significant interval change since the study from 1 day prior. Stable small left pleural effusion with adjacent retrocardiac opacity. No appreciable pneumothorax. Electronically Signed   By: Valetta Mole M.D.   On: 09/23/2021 08:32    Assessment/Plan: S/P Procedure(s) (LRB): CORONARY ARTERY BYPASS GRAFTING (CABG)  X FOUR, ON PUMP, USING LEFT INTERNAL MAMMARY ARTERY AND RIGHT ENDOSCOPIC GREATER SAPHENOUS VEIN CONDUITS (N/A) TRANSESOPHAGEAL ECHOCARDIOGRAM (TEE) (N/A) APPLICATION OF CELL SAVER ENDOVEIN HARVEST OF GREATER SAPHENOUS VEIN  POD#3  1 Tmax 99.1, VSS, s BP 105-155, sinus rhythm, sinus tachy, + PVC's- nausea + vomiting overnight 2 sats good on RA 3 good UOP, creat trending down well, 1.48 today, weight trending down off diuretics 4 BS 127-232 range, poor preop control with HGB A1C 8.9- resume lantus at home will need close outpt management  5 abdominal exam somewhat concerning with N/V overnight/ tenderness- will get AXR's, hopefully just ileus- make NPO 6 cont pulm hygiene, cardiac rehab     LOS: 3 days    John Giovanni PA-c Pager [phone] 09/24/2021   Nausea overnight.  Attempted to place NG tube without success Encourage more ambulation.   Creatinine improved. We will keep in the unit for ileus and language barrier.  Concern for aspiration if transfer to the floor too soon.  Lyrah Bradt Bary Leriche

## 2021-09-24 NOTE — Progress Notes (Signed)
eLink Physician-Brief Progress Note Patient Name: Barry [name] DOB: [birth year] MRN: [id number]   Date of Service  09/24/2021  HPI/Events of Note  pt continues to be nauseated, received zofran at 0121  eICU Interventions  Phenergan 12.5 x 1     Intervention Category Minor Interventions: Routine modifications to care plan (e.g. PRN medications for pain, fever)  Barry [name] V. Hollie Bartus 09/24/2021, 3:35 AM

## 2021-09-24 NOTE — Progress Notes (Signed)
NAMEBlade [name], MRN:  [id number], DOB:  1951/05/20, LOS: 3 ADMISSION DATE:  09/21/2021, CONSULTATION DATE:  09/21/2021 REFERRING MD:  Dr. Kipp Brood, CHIEF COMPLAINT:  post CABG  History of Present Illness:   70 year old male who speaks [place], admitted on 12/27 for elective CABG by Dr. Kipp Brood.   Prior history of CAD with prior MI and PCI to the RCA and circumflex in 2015, HFrEF (06/2021 EF 40-45%, G1DD, slightly reduced RV function), HTN, carotid stenosis, HLD, CKD stage 3, CVA (2011), DM, and depression.  He had been having fatigue and anginal symptoms in which he was evaluated for in December 2022.  Underwent left heart cath on 08/31/21 which showed LAD, diagonal, and distal RCA disease and in-stent restenosis of his circumflex stent.  Underwent CABG x 4 (LIMA to LAD, reverse saphenous vein graft to PDA, first diagonal, and OM with endoscopic greater saphenous vein harvest on the right) on 12/27.   Pertinent  Medical History  CAD with prior MI and PCI 2015, HFrEF, HTN, carotid stenosis, HLD, CKD stage 3, CVA (2011), DM, depression  Significant Hospital Events: Including procedures, antibiotic start and stop dates in addition to other pertinent events   12/27 CABG x 4  12/29 Cr bump but good UOP, Cts out, watching Cr in ICU 12/30 improving Cr. Hr improving as well. HDS.   Interim History / Subjective:  Cr decr to 1.48 this morning from 2.05  HR 80s    KUB concerning for ileus  Objective   Blood pressure (!) 147/93, pulse (!) 58, temperature 99.1 F (37.3 C), temperature source Oral, resp. rate 11, height 5\' 5"  (1.651 m), weight 74.3 kg, SpO2 94 %.      Intake/Output Summary (Last 24 hours) at 09/24/2021 0851 Last data filed at 09/24/2021 0300 Gross per 24 hour  Intake 300 ml  Output 2160 ml  Net -1860 ml   Filed Weights   09/22/21 0500 09/23/21 0500 09/24/21 0600  Weight: 76.7 kg 75.7 kg 74.3 kg   Examination:  General: wd older adult M reclined in bed NAD HEENT:  NCAT pink mm Neuro:  AAOx3 following commands CV: mediastinal dressing intact. Sternal wires  PULM:  even unlabored. Symmetrical chest expansion  GI: soft, distended. Hypoactive  Extremities:  no acute joint deformity no cyanosis or clubbing  Skin: c/d/w no rash   Resolved Hospital Problem list    Assessment & Plan:    CAD s/p CABG x4 AKI on CKD 3 - improving  Post-op ileus  Hypoxia due to atelectasis, splinting - improving Thrombocytopenia, stable  HTN HLD DM -12/30 Cr is improving now 1.48 -Hr is also improved -- now in 80s  P -wires per CVTS  -following UOP, renal indices closely -Is, mobility -chest tubes out.  -ASA, statin  -SSI -BID metop  -NPO -- serial abdominal exams    Best Practice (right click and "Reselect all SmartList Selections" daily)   Diet/type: Regular consistency (see orders) DVT prophylaxis: SCD GI prophylaxis: PPI Lines: Central line and yes and it is still needed Foley:  Yes, and it is no longer needed and removal ordered  Code Status:  full code Last date of multidisciplinary goals of care discussion [per TCTS] Wife & pt updated at bedside 12/29   Labs   CBC: Recent Labs  Lab 09/21/21 1254 09/21/21 1258 09/21/21 1802 09/21/21 2103 09/21/21 2210 09/22/21 0319 09/22/21 1621 09/23/21 0345  WBC 7.0  --  13.6*  --   --  10.6* 14.3*  13.2*  HGB 11.1*   < > 11.7* 10.5* 9.9* 10.0* 10.3* 9.9*  HCT 34.0*   < > 35.9* 31.0* 29.0* 30.2* 32.9* 32.4*  MCV 88.3  --  88.6  --   --  88.0 90.9 91.3  PLT 78*  --  97*  --   --  87* 97* 99*   < > = values in this interval not displayed.    Basic Metabolic Panel: Recent Labs  Lab 09/21/21 1802 09/21/21 2103 09/21/21 2210 09/22/21 0319 09/22/21 1621 09/23/21 0345 09/24/21 0333  NA 138   < > 142 140 135 134* 138  K 4.5   < > 3.7 4.1 4.0 4.0 3.9  CL 109  --   --  108 100 99 103  CO2 21*  --   --  24 23 28 28   GLUCOSE 133*  --   --  152* 172* 139* 216*  BUN 16  --   --  17 20 30* 22   CREATININE 1.30*  --   --  1.22 1.34* 2.05* 1.48*  CALCIUM 8.3*  --   --  7.9* 8.4* 8.2* 8.6*  MG 3.3*  --   --  2.3 2.2  --   --    < > = values in this interval not displayed.   GFR: Estimated Creatinine Clearance: 43.1 mL/min (A) (by C-G formula based on SCr of 1.48 mg/dL (H)). Recent Labs  Lab 09/21/21 1802 09/22/21 0319 09/22/21 1621 09/23/21 0345  WBC 13.6* 10.6* 14.3* 13.2*    Liver Function Tests: Recent Labs  Lab 09/17/21 1013  AST 19  ALT 22  ALKPHOS 78  BILITOT 0.7  PROT 7.2  ALBUMIN 4.0   No results for input(s): LIPASE, AMYLASE in the last 168 hours. No results for input(s): AMMONIA in the last 168 hours.  ABG    Component Value Date/Time   PHART 7.356 09/21/2021 2210   PCO2ART 40.6 09/21/2021 2210   PO2ART 102 09/21/2021 2210   HCO3 22.7 09/21/2021 2210   TCO2 24 09/21/2021 2210   ACIDBASEDEF 3.0 (H) 09/21/2021 2210   O2SAT 98.0 09/21/2021 2210     Coagulation Profile: Recent Labs  Lab 09/17/21 1013 09/21/21 1254  INR 0.9 1.4*    Cardiac Enzymes: No results for input(s): CKTOTAL, CKMB, CKMBINDEX, TROPONINI in the last 168 hours.  HbA1C: Hgb A1c MFr Bld  Date/Time Value Ref Range Status  09/17/2021 10:15 AM 8.9 (H) 4.8 - 5.6 % Final    Comment:    (NOTE) Pre diabetes:          5.7%-6.4%  Diabetes:              >6.4%  Glycemic control for   <7.0% adults with diabetes   06/07/2021 01:55 PM 8.7 (H) 4.6 - 6.5 % Final    Comment:    Glycemic Control Guidelines for People with Diabetes:Non Diabetic:  <6%Goal of Therapy: <7%Additional Action Suggested:  >8%     CBG: Recent Labs  Lab 09/23/21 1749 09/23/21 1940 09/23/21 2316 09/24/21 0318 09/24/21 0734  GLUCAP 231* 172* 155* 202* 161*     CRITICAL CARE Performed by: Cristal Generous  CCT: n/a   Eliseo Gum MSN, AGACNP-BC [place] for pager  09/24/2021, 8:51 AM

## 2021-09-24 NOTE — Progress Notes (Signed)
4 attempts made to place NG tube.  Pt gags and tongue thrusts vigorously despite coaching and instruction from RN staff and wife.  MD notified,

## 2021-09-25 ENCOUNTER — Inpatient Hospital Stay (HOSPITAL_COMMUNITY): Payer: Medicare Other

## 2021-09-25 DIAGNOSIS — Z951 Presence of aortocoronary bypass graft: Secondary | ICD-10-CM | POA: Diagnosis not present

## 2021-09-25 LAB — BASIC METABOLIC PANEL
Anion gap: 8 (ref 5–15)
BUN: 16 mg/dL (ref 8–23)
CO2: 29 mmol/L (ref 22–32)
Calcium: 8.4 mg/dL — ABNORMAL LOW (ref 8.9–10.3)
Chloride: 103 mmol/L (ref 98–111)
Creatinine, Ser: 1.34 mg/dL — ABNORMAL HIGH (ref 0.61–1.24)
GFR, Estimated: 57 mL/min — ABNORMAL LOW (ref >60)
Glucose, Bld: 171 mg/dL — ABNORMAL HIGH (ref 70–99)
Potassium: 3.8 mmol/L (ref 3.5–5.1)
Sodium: 140 mmol/L (ref 135–145)

## 2021-09-25 LAB — CBC
HCT: 33.5 % — ABNORMAL LOW (ref 39.0–52.0)
Hemoglobin: 10.6 g/dL — ABNORMAL LOW (ref 13.0–17.0)
MCH: 29 pg (ref 26.0–34.0)
MCHC: 31.6 g/dL (ref 30.0–36.0)
MCV: 91.5 fL (ref 80.0–100.0)
Platelets: 127 10*3/uL — ABNORMAL LOW (ref 150–400)
RBC: 3.66 MIL/uL — ABNORMAL LOW (ref 4.22–5.81)
RDW: 12.6 % (ref 11.5–15.5)
WBC: 8.7 10*3/uL (ref 4.0–10.5)
nRBC: 0 % (ref 0.0–0.2)

## 2021-09-25 LAB — GLUCOSE, CAPILLARY
Glucose-Capillary: 162 mg/dL — ABNORMAL HIGH (ref 70–99)
Glucose-Capillary: 165 mg/dL — ABNORMAL HIGH (ref 70–99)
Glucose-Capillary: 178 mg/dL — ABNORMAL HIGH (ref 70–99)
Glucose-Capillary: 208 mg/dL — ABNORMAL HIGH (ref 70–99)
Glucose-Capillary: 216 mg/dL — ABNORMAL HIGH (ref 70–99)
Glucose-Capillary: 89 mg/dL (ref 70–99)

## 2021-09-25 MED ORDER — ACETAMINOPHEN 325 MG PO TABS
650.0000 mg | ORAL_TABLET | Freq: Four times a day (QID) | ORAL | Status: DC | PRN
  Administered 2021-09-25 (×2): 650 mg via ORAL
  Filled 2021-09-25 (×2): qty 2

## 2021-09-25 MED ORDER — INSULIN ASPART 100 UNIT/ML IJ SOLN
0.0000 [IU] | Freq: Every day | INTRAMUSCULAR | Status: DC
  Administered 2021-09-25: 2 [IU] via SUBCUTANEOUS

## 2021-09-25 MED ORDER — ALUM & MAG HYDROXIDE-SIMETH 200-200-20 MG/5ML PO SUSP: 15.0000 mL | Freq: Four times a day (QID) | ORAL | Status: DC | PRN

## 2021-09-25 MED ORDER — SODIUM CHLORIDE 0.9 % IV SOLN: 250.0000 mL | INTRAVENOUS | Status: DC | PRN

## 2021-09-25 MED ORDER — ~~LOC~~ CARDIAC SURGERY, PATIENT & FAMILY EDUCATION: Freq: Once | Status: DC

## 2021-09-25 MED ORDER — SODIUM CHLORIDE 0.9% FLUSH
3.0000 mL | Freq: Two times a day (BID) | INTRAVENOUS | Status: DC
  Administered 2021-09-25 – 2021-09-28 (×7): 3 mL via INTRAVENOUS

## 2021-09-25 MED ORDER — INSULIN ASPART 100 UNIT/ML IJ SOLN
0.0000 [IU] | Freq: Three times a day (TID) | INTRAMUSCULAR | Status: DC
  Administered 2021-09-25: 5 [IU] via SUBCUTANEOUS
  Administered 2021-09-26: 3 [IU] via SUBCUTANEOUS
  Administered 2021-09-26: 5 [IU] via SUBCUTANEOUS
  Administered 2021-09-26: 3 [IU] via SUBCUTANEOUS
  Administered 2021-09-27: 2 [IU] via SUBCUTANEOUS
  Administered 2021-09-27: 3 [IU] via SUBCUTANEOUS
  Administered 2021-09-27: 5 [IU] via SUBCUTANEOUS
  Administered 2021-09-28: 3 [IU] via SUBCUTANEOUS

## 2021-09-25 MED ORDER — LACTULOSE 10 GM/15ML PO SOLN
20.0000 g | Freq: Once | ORAL | Status: AC
  Administered 2021-09-25: 20 g via ORAL
  Filled 2021-09-25: qty 30

## 2021-09-25 MED ORDER — ASPIRIN EC 81 MG PO TBEC
81.0000 mg | DELAYED_RELEASE_TABLET | Freq: Every day | ORAL | Status: DC
Start: 2021-09-25 — End: 2021-09-28
  Administered 2021-09-25 – 2021-09-27 (×3): 81 mg via ORAL
  Filled 2021-09-25 (×3): qty 1

## 2021-09-25 MED ORDER — MAGNESIUM HYDROXIDE 400 MG/5ML PO SUSP: 30.0000 mL | Freq: Every day | ORAL | Status: DC | PRN

## 2021-09-25 MED ORDER — METOCLOPRAMIDE HCL 5 MG/5ML PO SOLN
10.0000 mg | Freq: Three times a day (TID) | ORAL | Status: AC
  Administered 2021-09-25 – 2021-09-27 (×5): 10 mg via ORAL
  Filled 2021-09-25 (×6): qty 10

## 2021-09-25 MED ORDER — GABAPENTIN 300 MG PO CAPS
300.0000 mg | ORAL_CAPSULE | Freq: Three times a day (TID) | ORAL | Status: DC
  Administered 2021-09-25 – 2021-09-28 (×10): 300 mg via ORAL
  Filled 2021-09-25 (×10): qty 1

## 2021-09-25 MED ORDER — SODIUM CHLORIDE 0.9% FLUSH: 3.0000 mL | INTRAVENOUS | Status: DC | PRN

## 2021-09-25 MED ORDER — METOCLOPRAMIDE HCL 5 MG/ML IJ SOLN: 5.0000 mg | Freq: Four times a day (QID) | INTRAMUSCULAR | Status: DC

## 2021-09-25 NOTE — Progress Notes (Signed)
4 Days Post-Op Procedure(s) (LRB): CORONARY ARTERY BYPASS GRAFTING (CABG)  X FOUR, ON PUMP, USING LEFT INTERNAL MAMMARY ARTERY AND RIGHT ENDOSCOPIC GREATER SAPHENOUS VEIN CONDUITS (N/A) TRANSESOPHAGEAL ECHOCARDIOGRAM (TEE) (N/A) APPLICATION OF CELL SAVER ENDOVEIN HARVEST OF GREATER SAPHENOUS VEIN Subjective: No complaints this AM Denies nausea, no further emesis, hungry  Objective: Vital signs in last 24 hours: Temp:  [98.7 F (37.1 C)-99.7 F (37.6 C)] 99.1 F (37.3 C) (12/31 0700) Cardiac Rhythm: Normal sinus rhythm (12/31 0600) Resp:  [0-26] 17 (12/31 0600) BP: (103-165)/(66-110) 110/73 (12/31 0600) SpO2:  [90 %-96 %] 96 % (12/31 0600) Weight:  [72.3 kg] 72.3 kg (12/31 0500)  Hemodynamic parameters for last 24 hours:    Intake/Output from previous day: 12/30 0701 - 12/31 0700 In: 549.3 [I.V.:549.3] Out: 600 [Urine:600] Intake/Output this shift: No intake/output data recorded.  General appearance: alert, cooperative, and no distress Neurologic: intact Heart: regular rate and rhythm Lungs: diminished breath sounds bibasilar Abdomen: mildly distended, nontender, + BS  Lab Results: Recent Labs    09/23/21 0345 09/25/21 0359  WBC 13.2* 8.7  HGB 9.9* 10.6*  HCT 32.4* 33.5*  PLT 99* 127*   BMET:  Recent Labs    09/24/21 0333 09/25/21 0359  NA 138 140  K 3.9 3.8  CL 103 103  CO2 28 29  GLUCOSE 216* 171*  BUN 22 16  CREATININE 1.48* 1.34*  CALCIUM 8.6* 8.4*    PT/INR: No results for input(s): LABPROT, INR in the last 72 hours. ABG    Component Value Date/Time   PHART 7.356 09/21/2021 2210   HCO3 22.7 09/21/2021 2210   TCO2 24 09/21/2021 2210   ACIDBASEDEF 3.0 (H) 09/21/2021 2210   O2SAT 98.0 09/21/2021 2210   CBG (last 3)  Recent Labs    09/25/21 0010 09/25/21 0414 09/25/21 0730  GLUCAP 162* 165* 178*    Assessment/Plan: S/P Procedure(s) (LRB): CORONARY ARTERY BYPASS GRAFTING (CABG)  X FOUR, ON PUMP, USING LEFT INTERNAL MAMMARY ARTERY AND  RIGHT ENDOSCOPIC GREATER SAPHENOUS VEIN CONDUITS (N/A) TRANSESOPHAGEAL ECHOCARDIOGRAM (TEE) (N/A) APPLICATION OF CELL SAVER ENDOVEIN HARVEST OF GREATER SAPHENOUS VEIN Plan for transfer to step-down: see transfer orders POD # 4 CV- in Sr  ASA, statin, beta blocker, restart ACE-I prior to DC RESP- IS for atelectasis RENAL- creatinine improved, lytes OK  Weight at preop ENDO- CBG moderately elevated, change SSI to [facility] and HS GI- Ileus resolving- advance diet Continue cardiac rehab   LOS: 4 days    Barry [name] 09/25/2021

## 2021-09-25 NOTE — Progress Notes (Signed)
CARDIAC REHAB PHASE I   PRE:  Rate/Rhythm: 65 SR  BP:  Supine: 130/75 Sitting:   Standing:    SaO2: 94% RA  MODE:  Ambulation: 500 ft   POST:  Rate/Rhythm: 93  BP:  Supine: 127/61 Sitting:   Standing:    SaO2: 98% RA  1200-1325 Patient tolerated ambulation well with assist x1 and pushing rolling walker, gait fast, steady, no symptoms. O[facility] education completed with patient, patient's wife, and son via interpreter services. Education included sternal precautions, wound care, restrictions, risk factor modification, and activity progression. Handouts given on diabetes nutrition, heart healthy eating, and safe movement "In the Tube", and exercise guidelines.    Sol Passer, MS, ACSM CEP

## 2021-09-25 NOTE — Progress Notes (Signed)
° °  NAMEChristoph [name], MRN:  [id number], DOB:  02/26/1951, LOS: 4 ADMISSION DATE:  09/21/2021, CONSULTATION DATE:  09/21/2021 REFERRING MD:  Dr. Kipp Brood, CHIEF COMPLAINT:  post CABG  History of Present Illness:   70 year old male who speaks [place], admitted on 12/27 for elective CABG by Dr. Kipp Brood.   Prior history of CAD with prior MI and PCI to the RCA and circumflex in 2015, HFrEF (06/2021 EF 40-45%, G1DD, slightly reduced RV function), HTN, carotid stenosis, HLD, CKD stage 3, CVA (2011), DM, and depression.  He had been having fatigue and anginal symptoms in which he was evaluated for in December 2022.  Underwent left heart cath on 08/31/21 which showed LAD, diagonal, and distal RCA disease and in-stent restenosis of his circumflex stent.  Underwent CABG x 4 (LIMA to LAD, reverse saphenous vein graft to PDA, first diagonal, and OM with endoscopic greater saphenous vein harvest on the right) on 12/27.   Pertinent  Medical History  CAD with prior MI and PCI 2015, HFrEF, HTN, carotid stenosis, HLD, CKD stage 3, CVA (2011), DM, depression  Significant Hospital Events: Including procedures, antibiotic start and stop dates in addition to other pertinent events   12/27 CABG x 4  12/29 Cr bump but good UOP, Cts out, watching Cr in ICU 12/30 improving Cr. Hr improving as well. HDS.   Interim History / Subjective:  Still not tolerating PO very well. +flatus, no BM No further BM BMP pending  Objective   Blood pressure 110/73, pulse (!) 58, temperature 99.1 F (37.3 C), temperature source Oral, resp. rate 17, height 5\' 5"  (1.651 m), weight 72.3 kg, SpO2 96 %.      Intake/Output Summary (Last 24 hours) at 09/25/2021 3491 Last data filed at 09/25/2021 0600 Gross per 24 hour  Intake 549.27 ml  Output 600 ml  Net -50.73 ml    Filed Weights   09/23/21 0500 09/24/21 0600 09/25/21 0500  Weight: 75.7 kg 74.3 kg 72.3 kg   Examination:  No distress Sternal incision CDI Abdomen mildly TTP,  hypoactive BS Ext warm  Labs pending  Patient Lines/Drains/Airways Status     Active Line/Drains/Airways     Name Placement date Placement time Site Days   Peripheral IV 09/21/21 16 G Right Hand 09/21/21  0645  Hand  4   CVC Triple Lumen 09/21/21 Right Internal jugular 09/21/21  0700  -- 4   Incision (Closed) 09/21/21 Chest Other (Comment) 09/21/21  0904  -- 4   Incision (Closed) 09/21/21 Leg Right 09/21/21  0932  -- 4              Resolved Hospital Problem list    Assessment & Plan:    CAD s/p CABG x4 AKI on CKD 3 - improving  Post-op ileus  Hypoxia due to atelectasis, splinting - improving Thrombocytopenia, imrpoved  HTN HLD DM  - trial of standing suppositories and reglan - did not tolerate bedside NGT attempt even with sedation, may need IR/fluoro guided if run into issues - push mobility - will follow while in [place] (right click and "Reselect all SmartList Selections" daily)   Per primary  Erskine Emery MD PCCM

## 2021-09-25 NOTE — Progress Notes (Signed)
Pt arrived to 4E from [place]. A&Ox4. VSS. CHG bath given. Telemetry applied and CCMD called. Pt was oriented to room and call light is in reach.  Raelyn Number, RN

## 2021-09-26 ENCOUNTER — Inpatient Hospital Stay (HOSPITAL_COMMUNITY): Payer: Medicare Other

## 2021-09-26 LAB — CBC
HCT: 33 % — ABNORMAL LOW (ref 39.0–52.0)
Hemoglobin: 10.4 g/dL — ABNORMAL LOW (ref 13.0–17.0)
MCH: 28.3 pg (ref 26.0–34.0)
MCHC: 31.5 g/dL (ref 30.0–36.0)
MCV: 89.9 fL (ref 80.0–100.0)
Platelets: 128 10*3/uL — ABNORMAL LOW (ref 150–400)
RBC: 3.67 MIL/uL — ABNORMAL LOW (ref 4.22–5.81)
RDW: 12.7 % (ref 11.5–15.5)
WBC: 7.3 10*3/uL (ref 4.0–10.5)
nRBC: 0 % (ref 0.0–0.2)

## 2021-09-26 LAB — GLUCOSE, CAPILLARY
Glucose-Capillary: 178 mg/dL — ABNORMAL HIGH (ref 70–99)
Glucose-Capillary: 238 mg/dL — ABNORMAL HIGH (ref 70–99)
Glucose-Capillary: 178 mg/dL — ABNORMAL HIGH (ref 70–99)
Glucose-Capillary: 185 mg/dL — ABNORMAL HIGH (ref 70–99)

## 2021-09-26 LAB — BASIC METABOLIC PANEL
Anion gap: 9 (ref 5–15)
BUN: 25 mg/dL — ABNORMAL HIGH (ref 8–23)
CO2: 25 mmol/L (ref 22–32)
Calcium: 8.4 mg/dL — ABNORMAL LOW (ref 8.9–10.3)
Chloride: 102 mmol/L (ref 98–111)
Creatinine, Ser: 1.48 mg/dL — ABNORMAL HIGH (ref 0.61–1.24)
GFR, Estimated: 50 mL/min — ABNORMAL LOW (ref >60)
Glucose, Bld: 210 mg/dL — ABNORMAL HIGH (ref 70–99)
Potassium: 4.3 mmol/L (ref 3.5–5.1)
Sodium: 136 mmol/L (ref 135–145)

## 2021-09-26 MED ORDER — PANTOPRAZOLE SODIUM 40 MG PO TBEC
40.0000 mg | DELAYED_RELEASE_TABLET | Freq: Every day | ORAL | Status: DC
  Administered 2021-09-27: 40 mg via ORAL
  Filled 2021-09-26: qty 1

## 2021-09-26 MED ORDER — LACTULOSE 10 GM/15ML PO SOLN: 10.0000 g | Freq: Every day | ORAL | Status: DC | PRN

## 2021-09-26 NOTE — Progress Notes (Signed)
Mobility Specialist: Progress Note   09/26/21 1027  Mobility  Activity Ambulated in hall  Level of Assistance Modified independent, requires aide device or extra time  Assistive Device Front wheel walker  Distance Ambulated (ft) 940 ft  Mobility Ambulated with assistance in hallway  Mobility Response Tolerated well  Mobility performed by Mobility specialist  $Mobility charge 1 Mobility   Pre-Mobility: 88 HR, 96% SpO2 Post-Mobility: 92 HR, 95% SpO2  Pt mod I with c/o some chest pain, no other c/o throughout. Pt back to bed after walk per request with call bell in reach.   [facility] Tenasia Aull Mobility Specialist Mobility Specialist 4 [place]: [phone] Mobility Specialist 2 [place] and [place]: [phone]

## 2021-09-26 NOTE — Progress Notes (Addendum)
[address]       [place] [address]             [phone]      5 Days Post-Op Procedure(s) (LRB): CORONARY ARTERY BYPASS GRAFTING (CABG)  X FOUR, ON PUMP, USING LEFT INTERNAL MAMMARY ARTERY AND RIGHT ENDOSCOPIC GREATER SAPHENOUS VEIN CONDUITS (N/A) TRANSESOPHAGEAL ECHOCARDIOGRAM (TEE) (N/A) APPLICATION OF CELL SAVER ENDOVEIN HARVEST OF GREATER SAPHENOUS VEIN Subjective: Feeling better, improving strength, no nausea/vomiting  Objective: Vital signs in last 24 hours: Temp:  [97.8 F (36.6 C)-99 F (37.2 C)] 97.8 F (36.6 C) (01/01 0350) Pulse Rate:  [67-81] 67 (01/01 0350) Cardiac Rhythm: Normal sinus rhythm (12/31 2259) Resp:  [11-20] 15 (01/01 0350) BP: (96-130)/(60-75) 105/64 (01/01 0350) SpO2:  [91 %-96 %] 94 % (01/01 0350) Weight:  [74.4 kg] 74.4 kg (01/01 0350)  Hemodynamic parameters for last 24 hours:    Intake/Output from previous day: 12/31 0701 - 01/01 0700 In: 720 [P.O.:720] Out: 500 [Urine:500] Intake/Output this shift: No intake/output data recorded.  General appearance: alert, cooperative, fatigued, and no distress Heart: regular rate and rhythm Lungs: mildly dim in bases Abdomen: soft, + BS, minor distension Extremities: no edema Wound: incis healing well  Lab Results: Recent Labs    09/25/21 0359 09/26/21 0309  WBC 8.7 7.3  HGB 10.6* 10.4*  HCT 33.5* 33.0*  PLT 127* 128*   BMET:  Recent Labs    09/25/21 0359 09/26/21 0309  NA 140 136  K 3.8 4.3  CL 103 102  CO2 29 25  GLUCOSE 171* 210*  BUN 16 25*  CREATININE 1.34* 1.48*  CALCIUM 8.4* 8.4*    PT/INR: No results for input(s): LABPROT, INR in the last 72 hours. ABG    Component Value Date/Time   PHART 7.356 09/21/2021 2210   HCO3 22.7 09/21/2021 2210   TCO2 24 09/21/2021 2210   ACIDBASEDEF 3.0 (H) 09/21/2021 2210   O2SAT 98.0 09/21/2021 2210   CBG (last 3)  Recent Labs    09/25/21 1625 09/25/21 2108 09/26/21 0620  GLUCAP 89 208* 178*     Meds Scheduled Meds:  aspirin EC  81 mg Oral Daily   bisacodyl  10 mg Rectal Daily   Chlorhexidine Gluconate Cloth  6 each Topical Q0600   [place] [facility], Patient & Family Education   Does not apply Once   enoxaparin (LOVENOX) injection  40 mg Subcutaneous QHS   gabapentin  300 mg Oral TID   insulin aspart  0-15 Units Subcutaneous TID WC   insulin aspart  0-5 Units Subcutaneous QHS   metoCLOPramide  10 mg Oral TID AC   metoprolol tartrate  25 mg Oral BID   oxymetazoline  1 spray Right Nare BID   pantoprazole (PROTONIX) IV  40 mg Intravenous Q24H   sodium chloride flush  3 mL Intravenous Q12H   Continuous Infusions:  sodium chloride     PRN Meds:.sodium chloride, acetaminophen, alum & mag hydroxide-simeth, hydrALAZINE, magnesium hydroxide, metoprolol tartrate, ondansetron (ZOFRAN) IV, oxyCODONE, sodium chloride flush, traMADol  Xrays DG ABD ACUTE 2+V W 1V CHEST  Result Date: 09/25/2021 CLINICAL DATA:  Abdominal tenderness. EXAM: DG ABDOMEN ACUTE WITH 1 VIEW CHEST COMPARISON:  09/23/2021 FINDINGS: Similar appearance of diffuse gaseous distension of the large and small bowel loops concerning for ileus. No signs of free air. Right IJ catheter is noted with tip projecting over the SVC. Stable cardiomediastinal contours. Small left pleural effusion noted. IMPRESSION: 1. Stable appearance  of diffuse gaseous distension of the large and small bowel loops concerning for ileus. 2. Small left pleural effusion. Electronically Signed   By: Kerby Moors M.D.   On: 09/25/2021 07:37    Assessment/Plan: S/P Procedure(s) (LRB): CORONARY ARTERY BYPASS GRAFTING (CABG)  X FOUR, ON PUMP, USING LEFT INTERNAL MAMMARY ARTERY AND RIGHT ENDOSCOPIC GREATER SAPHENOUS VEIN CONDUITS (N/A) TRANSESOPHAGEAL ECHOCARDIOGRAM (TEE) (N/A) APPLICATION OF CELL SAVER ENDOVEIN HARVEST OF GREATER SAPHENOUS VEIN  1 Tmax 99, VSS, sinus rhythm with PVC couplets 2 sats ok on RA 3 UOP - not all measured 4  creat increased  c/w yesterday, not on diuretics/nephrotoxic meds except potentially neurontin( was on preop)will ask pharmacy to re- evaluate dose, hold on restart ACE-I 5 BS 89-208 range yesterday- resume home meds at d/c (lantus) 6 H/H very stable 7 leukocytosis resolved 8 thrombocytopenia conts to have improved trend 9 ileus improved- cont diet, will add lactulose 10 poss home in am if no new issues and has a good day with diet/rehab  LOS: 5 days    John Giovanni PA-C Pager [phone] 09/26/2021  Patient seen and examined, agree with above Mild abdominal distention but no tenderness, + BS  Aysa Larivee C. Roxan Hockey, MD [facility] [phone]

## 2021-09-27 LAB — CBC
HCT: 34 % — ABNORMAL LOW (ref 39.0–52.0)
Hemoglobin: 11 g/dL — ABNORMAL LOW (ref 13.0–17.0)
MCH: 28.6 pg (ref 26.0–34.0)
MCHC: 32.4 g/dL (ref 30.0–36.0)
MCV: 88.3 fL (ref 80.0–100.0)
Platelets: 173 10*3/uL (ref 150–400)
RBC: 3.85 MIL/uL — ABNORMAL LOW (ref 4.22–5.81)
RDW: 12.6 % (ref 11.5–15.5)
WBC: 8.2 10*3/uL (ref 4.0–10.5)
nRBC: 0 % (ref 0.0–0.2)

## 2021-09-27 LAB — GLUCOSE, CAPILLARY
Glucose-Capillary: 180 mg/dL — ABNORMAL HIGH (ref 70–99)
Glucose-Capillary: 235 mg/dL — ABNORMAL HIGH (ref 70–99)
Glucose-Capillary: 149 mg/dL — ABNORMAL HIGH (ref 70–99)
Glucose-Capillary: 111 mg/dL — ABNORMAL HIGH (ref 70–99)

## 2021-09-27 LAB — BASIC METABOLIC PANEL
Anion gap: 9 (ref 5–15)
BUN: 26 mg/dL — ABNORMAL HIGH (ref 8–23)
CO2: 27 mmol/L (ref 22–32)
Calcium: 8.8 mg/dL — ABNORMAL LOW (ref 8.9–10.3)
Chloride: 100 mmol/L (ref 98–111)
Creatinine, Ser: 1.58 mg/dL — ABNORMAL HIGH (ref 0.61–1.24)
GFR, Estimated: 46 mL/min — ABNORMAL LOW (ref >60)
Glucose, Bld: 196 mg/dL — ABNORMAL HIGH (ref 70–99)
Potassium: 4.5 mmol/L (ref 3.5–5.1)
Sodium: 136 mmol/L (ref 135–145)

## 2021-09-27 NOTE — Progress Notes (Signed)
Mobility Specialist: Progress Note   09/27/21 1514  Mobility  Activity Ambulated in hall  Level of Assistance Modified independent, requires aide device or extra time  Assistive Device Front wheel walker  Distance Ambulated (ft) 1200 ft  Mobility Ambulated with assistance in hallway  Mobility Response Tolerated well  Mobility performed by Mobility specialist  $Mobility charge 1 Mobility   Pre-Mobility: 83 HR, 96% SpO2 During Mobility: 100 HR Post-Mobility: 75 HR, 97% SpO2  Pt had no c/o throughout ambulation. Pt to BR after returning to room and then back to bed with call bell at his side. Family member present in the room.   [facility] Isidora Laham Mobility Specialist Mobility Specialist 4 [place]: [phone] Mobility Specialist 2 [place] and [place]: [phone]

## 2021-09-27 NOTE — Progress Notes (Signed)
Mobility Specialist: Progress Note   09/27/21 1037  Mobility  Activity Ambulated in hall  Level of Assistance Modified independent, requires aide device or extra time  Assistive Device Front wheel walker  Distance Ambulated (ft) 1200 ft  Mobility Ambulated with assistance in hallway  Mobility Response Tolerated well  Mobility performed by Mobility specialist  $Mobility charge 1 Mobility   Pre-Mobility: 74 HR, 95% SpO2 Post-Mobility: 78 HR, 95% SpO2  Pt c/o minimal chest pain during ambulation, no other c/o throughout. Pt to BR after walk, void successful, then back to bed. Call bell is at his side.   [facility] Khambrel Amsden Mobility Specialist Mobility Specialist 4 [place]: [phone] Mobility Specialist 2 [place] and [place]: [phone]

## 2021-09-27 NOTE — Care Management Important Message (Signed)
Important Message  Patient Details  Name: Barry [name] MRN: [id number] Date of Birth: [birth year]   Medicare Important Message Given:  Yes     Barry [name] 09/27/2021, 2:46 PM

## 2021-09-27 NOTE — Progress Notes (Addendum)
[address]       [place] [address]             [phone]      6 Days Post-Op Procedure(s) (LRB): CORONARY ARTERY BYPASS GRAFTING (CABG)  X FOUR, ON PUMP, USING LEFT INTERNAL MAMMARY ARTERY AND RIGHT ENDOSCOPIC GREATER SAPHENOUS VEIN CONDUITS (N/A) TRANSESOPHAGEAL ECHOCARDIOGRAM (TEE) (N/A) APPLICATION OF CELL SAVER ENDOVEIN HARVEST OF GREATER SAPHENOUS VEIN Subjective: Feels better, no abdominal sx  Objective: Vital signs in last 24 hours: Temp:  [98.5 F (36.9 C)-98.9 F (37.2 C)] 98.5 F (36.9 C) (01/02 0721) Pulse Rate:  [75-91] 75 (01/02 0721) Cardiac Rhythm: Normal sinus rhythm (01/01 2008) Resp:  [17-20] 20 (01/02 0721) BP: (97-109)/(65-74) 97/72 (01/02 0721) SpO2:  [91 %-94 %] 93 % (01/02 0721)  Hemodynamic parameters for last 24 hours:    Intake/Output from previous day: 01/01 0701 - 01/02 0700 In: -  Out: 400 [Urine:400] Intake/Output this shift: No intake/output data recorded.  General appearance: alert, cooperative, and no distress Heart: regular rate and rhythm Lungs: clear to auscultation bilaterally Abdomen: benign Extremities: no edema Wound: incis healing well  Lab Results: Recent Labs    09/26/21 0309 09/27/21 0443  WBC 7.3 8.2  HGB 10.4* 11.0*  HCT 33.0* 34.0*  PLT 128* 173   BMET:  Recent Labs    09/26/21 0309 09/27/21 0443  NA 136 136  K 4.3 4.5  CL 102 100  CO2 25 27  GLUCOSE 210* 196*  BUN 25* 26*  CREATININE 1.48* 1.58*  CALCIUM 8.4* 8.8*    PT/INR: No results for input(s): LABPROT, INR in the last 72 hours. ABG    Component Value Date/Time   PHART 7.356 09/21/2021 2210   HCO3 22.7 09/21/2021 2210   TCO2 24 09/21/2021 2210   ACIDBASEDEF 3.0 (H) 09/21/2021 2210   O2SAT 98.0 09/21/2021 2210   CBG (last 3)  Recent Labs    09/26/21 1603 09/26/21 2137 09/27/21 0720  GLUCAP 178* 185* 180*    Meds Scheduled Meds:  aspirin EC  81 mg Oral Daily   bisacodyl  10 mg Rectal Daily    Chlorhexidine Gluconate Cloth  6 each Topical Q0600   [place] Cardiac Surgery, Patient & Family Education   Does not apply Once   enoxaparin (LOVENOX) injection  40 mg Subcutaneous QHS   gabapentin  300 mg Oral TID   insulin aspart  0-15 Units Subcutaneous TID WC   insulin aspart  0-5 Units Subcutaneous QHS   metoCLOPramide  10 mg Oral TID AC   metoprolol tartrate  25 mg Oral BID   oxymetazoline  1 spray Right Nare BID   pantoprazole  40 mg Oral Daily   sodium chloride flush  3 mL Intravenous Q12H   Continuous Infusions:  sodium chloride     PRN Meds:.sodium chloride, acetaminophen, alum & mag hydroxide-simeth, hydrALAZINE, lactulose, magnesium hydroxide, metoprolol tartrate, ondansetron (ZOFRAN) IV, oxyCODONE, sodium chloride flush, traMADol  Xrays DG Chest 2 View  Result Date: 09/26/2021 CLINICAL DATA:  CABG surgery on 09/21/2021.  Follow-up exam. EXAM: CHEST - 2 VIEW COMPARISON:  09/23/2021 and older studies. FINDINGS: All lines and tubes have been removed. Left lung base opacity, mildly improved, consistent with atelectasis with a probable small effusion. Mild atelectasis at the medial right lung base, stable. Remainder of the lungs is clear. No mediastinal widening. No pneumothorax. IMPRESSION: 1. No acute findings or evidence of an operative complication. 2. Mild improvement in the  left lung base opacity consistent with improved atelectasis/decreased pleural fluid. Electronically Signed   By: Lajean Manes M.D.   On: 09/26/2021 09:30    Assessment/Plan: S/P Procedure(s) (LRB): CORONARY ARTERY BYPASS GRAFTING (CABG)  X FOUR, ON PUMP, USING LEFT INTERNAL MAMMARY ARTERY AND RIGHT ENDOSCOPIC GREATER SAPHENOUS VEIN CONDUITS (N/A) TRANSESOPHAGEAL ECHOCARDIOGRAM (TEE) (N/A) APPLICATION OF CELL SAVER ENDOVEIN HARVEST OF GREATER SAPHENOUS VEIN   1 afeb, VSS 2 sats good on RA 3 voiding ok, weight about 2 kg >preop 4 BUN/creat trending higher (1.58), had previously plateaued at 2.05  and was trending down- he does have H/O CKD stage 3 Godina may be equalibrating to his steady state, he does have a reduced EF40-45%- not on any significant nephrotoxic meds or diuretics. CXR yest without vasc congestion, only tiny left effus  5 BS fair control- home meds at d/c 6 will discuss with MD may be ready for d/c     LOS: 6 days    Barry [name] 09/27/2021   Patient seen and examined, agree with above Will keep one more day to monitor urine output and repeat creatinine  Remo Lipps C. Roxan Hockey, MD [facility] [phone]

## 2021-09-28 LAB — BASIC METABOLIC PANEL
Anion gap: 8 (ref 5–15)
BUN: 27 mg/dL — ABNORMAL HIGH (ref 8–23)
CO2: 27 mmol/L (ref 22–32)
Calcium: 8.6 mg/dL — ABNORMAL LOW (ref 8.9–10.3)
Chloride: 100 mmol/L (ref 98–111)
Creatinine, Ser: 1.5 mg/dL — ABNORMAL HIGH (ref 0.61–1.24)
GFR, Estimated: 49 mL/min — ABNORMAL LOW (ref >60)
Glucose, Bld: 247 mg/dL — ABNORMAL HIGH (ref 70–99)
Potassium: 4.2 mmol/L (ref 3.5–5.1)
Sodium: 135 mmol/L (ref 135–145)

## 2021-09-28 LAB — GLUCOSE, CAPILLARY: Glucose-Capillary: 192 mg/dL — ABNORMAL HIGH (ref 70–99)

## 2021-09-28 MED ORDER — ASPIRIN EC 325 MG PO TBEC
325.0000 mg | DELAYED_RELEASE_TABLET | Freq: Every day | ORAL | Status: DC
  Administered 2021-09-28: 325 mg via ORAL
  Filled 2021-09-28: qty 1

## 2021-09-28 MED ORDER — ACETAMINOPHEN 325 MG PO TABS: 650.0000 mg | ORAL_TABLET | Freq: Four times a day (QID) | ORAL | Status: DC | PRN

## 2021-09-28 MED ORDER — OXYCODONE HCL 5 MG PO TABS: 5.0000 mg | ORAL_TABLET | Freq: Four times a day (QID) | ORAL | 0 refills | Status: AC | PRN

## 2021-09-28 MED ORDER — ASPIRIN 325 MG PO TBEC: 325.0000 mg | DELAYED_RELEASE_TABLET | Freq: Every day | ORAL | Status: DC

## 2021-09-28 MED ORDER — METOPROLOL TARTRATE 25 MG PO TABS: 25.0000 mg | ORAL_TABLET | Freq: Two times a day (BID) | ORAL | 1 refills | Status: DC

## 2021-09-28 NOTE — Progress Notes (Signed)
Discharge instructions (including medications) discussed with and copy provided to patient/caregiver 

## 2021-09-28 NOTE — Plan of Care (Signed)
°  Problem: Education: Goal: Will demonstrate proper wound care and an understanding of methods to prevent future damage Outcome: Adequate for Discharge Goal: Knowledge of disease or condition will improve Outcome: Adequate for Discharge Goal: Knowledge of the prescribed therapeutic regimen will improve Outcome: Adequate for Discharge Goal: Individualized Educational Video(s) Outcome: Adequate for Discharge

## 2021-09-28 NOTE — Progress Notes (Addendum)
[address]       [place] [address]             [phone]      7 Days Post-Op Procedure(s) (LRB): CORONARY ARTERY BYPASS GRAFTING (CABG)  X FOUR, ON PUMP, USING LEFT INTERNAL MAMMARY ARTERY AND RIGHT ENDOSCOPIC GREATER SAPHENOUS VEIN CONDUITS (N/A) TRANSESOPHAGEAL ECHOCARDIOGRAM (TEE) (N/A) APPLICATION OF CELL SAVER ENDOVEIN HARVEST OF GREATER SAPHENOUS VEIN Subjective: Feels well, no abdominal sx  Objective: Vital signs in last 24 hours: Temp:  [98.3 F (36.8 C)-98.7 F (37.1 C)] 98.3 F (36.8 C) (01/03 0523) Pulse Rate:  [68-79] 71 (01/03 0523) Cardiac Rhythm: Normal sinus rhythm;Bundle branch block (01/02 1900) Resp:  [15-20] 15 (01/03 0523) BP: (97-113)/(64-72) 113/72 (01/03 0523) SpO2:  [92 %-95 %] 95 % (01/03 0523) Weight:  [71.3 kg] 71.3 kg (01/03 0523)  Hemodynamic parameters for last 24 hours:    Intake/Output from previous day: 01/02 0701 - 01/03 0700 In: 243 [P.O.:240; I.V.:3] Out: -  Intake/Output this shift: No intake/output data recorded.  General appearance: alert, cooperative, and no distress Heart: regular rate and rhythm Lungs: clear to auscultation bilaterally Abdomen: benign Extremities: no edema Wound: incis healing well  Lab Results: Recent Labs    09/26/21 0309 09/27/21 0443  WBC 7.3 8.2  HGB 10.4* 11.0*  HCT 33.0* 34.0*  PLT 128* 173   BMET:  Recent Labs    09/27/21 0443 09/28/21 0317  NA 136 135  K 4.5 4.2  CL 100 100  CO2 27 27  GLUCOSE 196* 247*  BUN 26* 27*  CREATININE 1.58* 1.50*  CALCIUM 8.8* 8.6*    PT/INR: No results for input(s): LABPROT, INR in the last 72 hours. ABG    Component Value Date/Time   PHART 7.356 09/21/2021 2210   HCO3 22.7 09/21/2021 2210   TCO2 24 09/21/2021 2210   ACIDBASEDEF 3.0 (H) 09/21/2021 2210   O2SAT 98.0 09/21/2021 2210   CBG (last 3)  Recent Labs    09/27/21 1621 09/27/21 2121 09/28/21 0607  GLUCAP 149* 111* 192*    Meds Scheduled Meds:   aspirin EC  325 mg Oral Daily   bisacodyl  10 mg Rectal Daily   Chlorhexidine Gluconate Cloth  6 each Topical Q0600   [place] [facility] Surgery, Patient & Family Education   Does not apply Once   enoxaparin (LOVENOX) injection  40 mg Subcutaneous QHS   gabapentin  300 mg Oral TID   insulin aspart  0-15 Units Subcutaneous TID WC   insulin aspart  0-5 Units Subcutaneous QHS   metoprolol tartrate  25 mg Oral BID   pantoprazole  40 mg Oral Daily   sodium chloride flush  3 mL Intravenous Q12H   Continuous Infusions:  sodium chloride     PRN Meds:.sodium chloride, acetaminophen, alum & mag hydroxide-simeth, hydrALAZINE, lactulose, magnesium hydroxide, metoprolol tartrate, ondansetron (ZOFRAN) IV, oxyCODONE, sodium chloride flush, traMADol  Xrays No results found.  Assessment/Plan: S/P Procedure(s) (LRB): CORONARY ARTERY BYPASS GRAFTING (CABG)  X FOUR, ON PUMP, USING LEFT INTERNAL MAMMARY ARTERY AND RIGHT ENDOSCOPIC GREATER SAPHENOUS VEIN CONDUITS (N/A) TRANSESOPHAGEAL ECHOCARDIOGRAM (TEE) (N/A) APPLICATION OF CELL SAVER ENDOVEIN HARVEST OF GREATER SAPHENOUS VEIN  1 afeb VSS, sinus rhythm, occas PVC 2 sats good on RA 3 Creat 1.5, slightly improved from, won't resume lisinopril as BP somewhat low , no other diuretics/nephrotoxic meds, neurontin dose ,appropriate for level oif renal fxn 4 BS adeq control- resume home insulin dosing at d/c  5 on ASA. Statin and beta blocker-should be safe to  increase to 325 mg ASA 6 appears stable for d/c     LOS: 7 days    John Giovanni PA-C Pager [phone] 09/28/2021

## 2021-10-01 ENCOUNTER — Ambulatory Visit (INDEPENDENT_AMBULATORY_CARE_PROVIDER_SITE_OTHER): Payer: Self-pay | Admitting: Thoracic Surgery (Cardiothoracic Vascular Surgery)

## 2021-10-01 ENCOUNTER — Other Ambulatory Visit: Payer: Self-pay

## 2021-10-01 DIAGNOSIS — I2510 Atherosclerotic heart disease of native coronary artery without angina pectoris: Secondary | ICD-10-CM

## 2021-10-01 NOTE — Progress Notes (Signed)
°   °  [address]       ,[place] [address]             [phone]       Patient: Home Provider: Office Consent for Telemedicine visit obtained.  Todays visit was completed via a real-time telehealth (see specific modality noted below). The patient/authorized person provided oral consent at the time of the visit to engage in a telemedicine encounter with the present provider at [facility]. The patient/authorized person was informed of the potential benefits, limitations, and risks of telemedicine. The patient/authorized person expressed understanding that the laws that protect confidentiality also apply to telemedicine. The patient/authorized person acknowledged understanding that telemedicine does not provide emergency services and that he or she would need to call 911 or proceed to the nearest hospital for help if such a need arose.   Total time spent in the clinical discussion 10 minutes.  Telehealth Modality: Phone visit (audio only)  I had a telephone visit with  Barry [name] who is s/p CABG.  Overall doing well.  He is not ambulating much at home.  He is getting up to use the bathroom and eat his meals.  He has some questions about sternal pain and the bruise along his right leg.  I explained to him that all of this was normal.   Barry [name] will see [place] back in 1 month with a chest x-ray for cardiac rehab clearance.  Barry [name]

## 2021-10-04 ENCOUNTER — Telehealth: Payer: Self-pay | Admitting: *Deleted

## 2021-10-04 NOTE — Telephone Encounter (Signed)
Transition Care Management Follow-up Telephone Call Date of discharge and from where: 09/28/21 [facility] How have you been since you were released from the hospital? Spoke with patient's son Pros Svath using [place] interpreter [place] ID # [id number], son says patient is doing OK"just grumpy- because he's bored"  Any questions or concerns? No  Items Reviewed: Did the pt receive and understand the discharge instructions provided? Yes  Medications obtained and verified? Yes  Other? No  Any new allergies since your discharge? No  Dietary orders reviewed? Yes Do you have support at home? Yes   Home Care and Equipment/Supplies: Were home health services ordered? no If [place], what is the name of the agency? Not applicable  Has the agency set up a time to come to the patient's home? not applicable Were any new equipment or medical supplies ordered?  No What is the name of the medical supply agency? Not applicable Were you able to get the supplies/equipment? no Do you have any questions related to the use of the equipment or supplies? Not applicable  Functional Questionnaire: (I = Independent and D = Dependent) ADLs: I  Bathing/Dressing- I  Meal Prep- A  Eating- I  Maintaining continence- I  Transferring/Ambulation- I  Managing Meds- I  Follow up appointments reviewed:  PCP Hospital f/u appt confirmed? No  hospital d/c instructions did not indicate patient needs to see PCP Specialist Hospital f/u appt confirmed? Yes  Scheduled to see Laurann Montana on 10/12/21 @ 10:05 am. Are transportation arrangements needed? No  If their condition worsens, is the pt aware to call PCP or go to the Emergency Dept.? Yes Was the patient provided with contact information for the PCP's office or ED? Yes Was to pt encouraged to call back with questions or concerns? Yes   Kelli Churn RN, CCM, [facility] Care Management Coordinator - Managed [place] [facility]  [facility] [phone]

## 2021-10-11 ENCOUNTER — Telehealth (HOSPITAL_COMMUNITY): Payer: Self-pay

## 2021-10-11 NOTE — Telephone Encounter (Signed)
Called patient to see if he is interested in the Cardiac Rehab Program. Patient's son expressed interest. Explained scheduling process and went over insurance, patient's son verbalized understanding. Will contact patient for scheduling once f/u has been completed.

## 2021-10-12 ENCOUNTER — Encounter (HOSPITAL_BASED_OUTPATIENT_CLINIC_OR_DEPARTMENT_OTHER): Payer: Self-pay | Admitting: Family

## 2021-10-12 ENCOUNTER — Ambulatory Visit (HOSPITAL_BASED_OUTPATIENT_CLINIC_OR_DEPARTMENT_OTHER): Payer: Medicare Other | Admitting: Family

## 2021-10-12 ENCOUNTER — Other Ambulatory Visit: Payer: Self-pay

## 2021-10-12 VITALS — BP 116/68 | HR 68 | Ht 65.0 in | Wt 157.4 lb

## 2021-10-12 DIAGNOSIS — I5020 Unspecified systolic (congestive) heart failure: Secondary | ICD-10-CM | POA: Diagnosis not present

## 2021-10-12 DIAGNOSIS — I493 Ventricular premature depolarization: Secondary | ICD-10-CM | POA: Diagnosis not present

## 2021-10-12 DIAGNOSIS — E782 Mixed hyperlipidemia: Secondary | ICD-10-CM | POA: Diagnosis not present

## 2021-10-12 DIAGNOSIS — I25119 Atherosclerotic heart disease of native coronary artery with unspecified angina pectoris: Secondary | ICD-10-CM

## 2021-10-12 DIAGNOSIS — N1831 Chronic kidney disease, stage 3a: Secondary | ICD-10-CM

## 2021-10-12 DIAGNOSIS — E785 Hyperlipidemia, unspecified: Secondary | ICD-10-CM | POA: Diagnosis not present

## 2021-10-12 MED ORDER — METOPROLOL TARTRATE 25 MG PO TABS: 25.0000 mg | ORAL_TABLET | Freq: Two times a day (BID) | ORAL | 3 refills | Status: DC

## 2021-10-12 NOTE — Progress Notes (Signed)
Office Visit    Patient Name: Barry [name] Date of Encounter: 10/12/2021  PCP:  Darreld Mclean, MD   [place]  Cardiologist:  Sherren Mocha, MD  Advanced Practice Provider:  Liliane Shi, PA-C Electrophysiologist:  None      Chief Complaint    Barry [name] is a 71 y.o. male with a hx of CAD s/p CABG (LIMA-LAD, reverse SVG-PDA, first diagonal, OM), hyperlipidemia, heart failure with mildly reduced ejection fraction, PVC, chronic superior mesenteric artery dissection, PAD (L CIA with penetrating ulcer), CVA (2011), diabetes, hypertension, CKD, hyperlipidemia, AAA presents today for hospital follow up   Past Medical History    Past Medical History:  Diagnosis Date   Anginal pain ([place])    CAD (coronary artery disease)    LHC (02/26/14):  >> PCI:  Xience DES to pRCA // LHC (03/10/14):  >> PCI:  Xience DES to [facility] // Myoview 2/16: low risk // Cath 2016: patent stents; stable dz - med Rx // Myoview 10/22: anterolateral ischemia, inferoapical infarct w peri infarct ischemia; EF 41; High Risk // Cath 12/22: LCx stent occl, severe LAD and Dx dz, diffuse RCA disease >> CABG   Carotid stenosis    a. Carotid US (02/27/14):  Bilateral ICA 1-39%   CKD (chronic kidney disease) stage 3    Depression    Diabetes mellitus    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[place])    a. Mesenteric Artery Duplex (7/15):  pSMA chronic dissection with aneurysmal dilation of 1.29 cm (VVS);  b.  Chest CTA (02/26/14):  IMPRESSION:  1. No aortic  dissection or other acute abnormality.  2. 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  3. Stable 17 mm ectasia of the left common iliac artery.  4. Atherosclerosis, including aortoiliac and coronary artery disease.    HFmrEF (heart failure with mildly reduced EF) 09/2015   Echo 6/15: EF 50-55 // Echo 1/17: mod LVH, EF 55%, inf-lat HK, Gr 1 DD, mild LAE // Echocardiogram 10/22: EF 40-45, Gr 1 DD, mildly reduced RVSF, RVSP 26.6, trivial MR, trivial AI,  post and ant-lat HK   Hyperlipidemia    Hypertension    Iliac artery aneurysm, left (HCC)    Myocardial infarction (HCC)    PVC's (premature ventricular contractions) 07/21/2021   Monitor 10/22:  NSR avg HR 71; no AF/Flutter; no high grade HB or pathologic pauses PVC burden 20%, rare supraventricular beats w/o sustained arrhythmia    Stroke ([place]) 09/26/2009   Past Surgical History:  Procedure Laterality Date   CARDIAC CATHETERIZATION     CARDIAC CATHETERIZATION N/A 06/25/2015   Procedure: Left Heart Cath and Coronary Angiography;  Surgeon: Burnell Blanks, MD;  Location: [place] CV LAB;  Service: Cardiovascular;  Laterality: N/A;   COLONOSCOPY WITH PROPOFOL N/A 07/23/2015   Procedure: COLONOSCOPY WITH PROPOFOL;  Surgeon: [name], MD;  Location: [facility];  Service: Endoscopy;  Laterality: N/A;   CORONARY ANGIOPLASTY     CORONARY ARTERY BYPASS GRAFT N/A 09/21/2021   Procedure: CORONARY ARTERY BYPASS GRAFTING (CABG)  X FOUR, ON PUMP, USING LEFT INTERNAL MAMMARY ARTERY AND RIGHT ENDOSCOPIC GREATER SAPHENOUS VEIN CONDUITS;  Surgeon: Lajuana Matte, MD;  Location: [place];  Service: Open Heart Surgery;  Laterality: N/A;   ENDOVEIN HARVEST OF GREATER SAPHENOUS VEIN  09/21/2021   Procedure: ENDOVEIN HARVEST OF GREATER SAPHENOUS VEIN;  Surgeon: Lajuana Matte, MD;  Location: [place];  Service: Open Heart Surgery;;   LEFT HEART CATH  AND CORONARY ANGIOGRAPHY N/A 08/31/2021   Procedure: LEFT HEART CATH AND CORONARY ANGIOGRAPHY;  Surgeon: Belva Crome, MD;  Location: [place] CV LAB;  Service: Cardiovascular;  Laterality: N/A;   LEFT HEART CATHETERIZATION WITH CORONARY ANGIOGRAM N/A 02/26/2014   Procedure: LEFT HEART CATHETERIZATION WITH CORONARY ANGIOGRAM;  Surgeon: Wellington Hampshire, MD;  Location: [place] CATH LAB;  Service: Cardiovascular;  Laterality: N/A;   LEFT HEART CATHETERIZATION WITH CORONARY ANGIOGRAM N/A 03/10/2014   Procedure: LEFT HEART CATHETERIZATION WITH  CORONARY ANGIOGRAM;  Surgeon: Jettie Booze, MD;  Location: [facility] CATH LAB;  Service: Cardiovascular;  Laterality: N/A;   TEE WITHOUT CARDIOVERSION N/A 09/21/2021   Procedure: TRANSESOPHAGEAL ECHOCARDIOGRAM (TEE);  Surgeon: Lajuana Matte, MD;  Location: [place];  Service: Open Heart Surgery;  Laterality: N/A;    Allergies  Allergies  Allergen Reactions   Metformin And Related     Pt feels that this causes constipation and will not take     History of Present Illness    Barry [name] is a 71 y.o. male with a hx of CAD s/p CABG (LIMA-LAD, reverse SVG-PDA, first diagonal, OM), hyperlipidemia, heart failure with mildly reduced ejection fraction, PVC, chronic superior mesenteric artery dissection, PAD (L CIA with penetrating ulcer), CVA (2011), diabetes, hypertension, CKD, hyperlipidemia, AAA last seen while hospitalized.  Previous work-up of coronary artery disease includes NSTEMI June 2015 with DES to proximal RCA.  Readmitted 02-2024 with unstable angina with DES to the circumflex.  Myoview 10/2014 low risk study with no ischemia.  Cardiac cath 05/2015 with LCx and RCA stents okay, OM2 subbranch jailed, L PDA moderate stenosis recommended for medical management.  Myoview 06/2021 with anterolateral ischemia, EF 41%, high risk study.  Echocardiogram 06/2021 LVEF 40 to 45%.  [place] rwiht high PVC burden 20%. Underwent cardiac catheterization 08/31/2021 showing total occlusion due to ISR in the circumflex, severe disease in proximal to mid LAD and first diagonal with moderate disease in the right coronary artery disease.  He was recommended for CABG.  He was admitted 09/21/2021 for CABG X4.  Postop day 2 he developed significant issues with ileus and nausea and vomiting.  NG tube attempted without success.  Over time the ileus resolved.  He had AKI with peak creatinine of 2.05 but was 1.5 on discharge.   He presents today for follow-up with family member who assists with interpreting. Politely  declines additional interpretor. Since discharge has been feeling well. Walking in the home for up to 10 minutes. Reports only musculoskeletal chest discomfort after coughing spells which resolves with Tylenol. He was encouraged to use pillow when coughing. Cough is non productive and improving. Reports no shortness of breath nor dyspnea on exertion. No edema, orthopnea, PND. Reports no palpitations.  Chest tube sutures still in place which we will remove today.   EKGs/Labs/Other Studies Reviewed:   The following studies were reviewed today:  Echo 07/14/21 1. Frequent PVCs during exam.   2. Left ventricular ejection fraction, by estimation, is 40 to 45% with  beat to beat variability. The left ventricle has mildly decreased  function. The left ventricle demonstrates regional wall motion  abnormalities (see scoring diagram/findings for  description). There is mild left ventricular hypertrophy. Left ventricular  diastolic parameters are consistent with Grade I diastolic dysfunction  (impaired relaxation).   3. Right ventricular systolic function is mildly reduced. The right  ventricular size is normal. There is normal pulmonary artery systolic  pressure. The estimated right ventricular systolic pressure is 65.4 mmHg.  4. The mitral valve is grossly normal. Trivial mitral valve  regurgitation. No evidence of mitral stenosis.   5. The aortic valve is grossly normal. There is mild calcification of the  aortic valve. Aortic valve regurgitation is trivial. No aortic stenosis is  present.   6. The inferior vena cava is normal in size with greater than 50%  respiratory variability, suggesting right atrial pressure of 3 mmHg.   Myoview 07/15/21   Findings are consistent with ischemia. The study is high risk.   No ST deviation was noted.   LV perfusion is abnormal. There is evidence of ischemia. There is a medium defect with moderate reduction in uptake present in the apical to mid anterolateral  and apical is reversible consistent with ischemia. There is a inerior apical perfusion defect in rest that worsened with stress suggestive of infarct and potential peri-infarct ischemia.   Left ventricular function is abnormal. End diastolic cavity size is mildly enlarged. End systolic cavity size is moderately enlarged.   Prior study available for comparison from 11/18/2014. Prior study as normal, unable to access images.   Frequent PVCs through exam. Decreased LV function with global hypokinesis and inferior wall motion abnormality. There is evidence of anterolateral ischemia.   This is a positive study for ischemia and infarction, consistent with RCA and LCx disease.   Long term monitor 08/02/21 Patient had a min HR of 48 bpm, max HR of 169 bpm, and avg HR of 71 bpm. Predominant underlying rhythm was Sinus Rhythm. 2 Supraventricular Tachycardia runs occurred, the run with the fastest interval lasting 12 beats with a max rate of 169 bpm (avg 136  bpm); the run with the fastest interval was also the longest. Isolated SVEs were rare (<1.0%), SVE Triplets were rare (<1.0%), and no SVE Couplets were present. Isolated VEs were frequent (20.5%, 63250), VE Couplets were rare (<1.0%, 1396), and no VE  Triplets were present. Ventricular Bigeminy and Trigeminy were present.    SUMMARY: The basic rhythm is normal sinus with an average HR of 71 bpm No atrial fibrillation or flutter No high-grade heart block or pathologic pauses There are frequent PVC's with an overall burden in excess of 20% and rare supraventricular beats without sustained arrhythmias No ventricular tachycardia   LHC 08/31/21 CONCLUSIONS: Severe mid LAD disease.  Severe first diagonal disease. Total occlusion of the proximal to mid circumflex after the first obtuse marginal within a previously placed stent.  Circumflex has left to left collaterals. Right coronary contains a proximal stent with 40% ISR, distal RCA contains 60 to 70%  stenosis, continuation of RCA contains segmental 50% stenosis, a small third left ventricular branch contains 75% ostial to proximal stenosis. Diffuse 30% mid to distal left main narrowing Left ventricular dysfunction with anteroapical severe hypokinesis.  LVEF 40%.  LVEDP is normal.   RECOMMENDATION:   71 year old diabetic with total occlusion due to in-stent restenosis in the circumflex, severe disease in the proximal to mid LAD and first diagonal with moderate disease in the right coronary.  Given decreased LV function, coronary artery surgical revascularization is indicated.  We will arrange an appointment with the surgical team within the next week if possible.  Intraoperative TEE 56/25/63 Complications: No known complications during this procedure.  POST-OP IMPRESSIONS  _ Left Ventricle: Systolic function slightly improved globally. EF 45-50%.  _ Right Ventricle: The right ventricle appears unchanged from pre-bypass.  _ Aorta: The aorta appears unchanged from pre-bypass.  _ Left Atrial Appendage: The left atrial appendage appears  unchanged from  pre-bypass.  _ Aortic Valve: The aortic valve appears unchanged from pre-bypass.  _ Mitral Valve: The mitral valve appears unchanged from pre-bypass.  _ Tricuspid Valve: The tricuspid valve appears unchanged from pre-bypass.  _ Pulmonic Valve: The pulmonic valve appears unchanged from pre-bypass.   PRE-OP FINDINGS   Left Ventricle: The left ventricle has mild-moderately reduced systolic  function, with an ejection fraction of 40-45%. The cavity size was normal.  Concentric left ventricular hypertrophy.    Right Ventricle: The right ventricle has normal systolic function. The  cavity was normal. There is no increase in right ventricular wall  thickness.   Left Atrium: Left atrial size was dilated. No left atrial/left atrial  appendage thrombus was detected. The left atrial appendage is well  visualized and there is no evidence of  thrombus present.   Right Atrium: Right atrial size was normal in size.   Interatrial Septum: No atrial level shunt detected by color flow Doppler.  There is no evidence of a patent foramen ovale.   Pericardium: There is no evidence of pericardial effusion.   Mitral Valve: The mitral valve is normal in structure. Mitral valve  regurgitation is mild by color flow Doppler. The MR jet is  centrally-directed. There is no evidence of mitral valve vegetation.   Tricuspid Valve: The tricuspid valve was normal in structure. Tricuspid  valve regurgitation is moderate by color flow Doppler.   Aortic Valve: The aortic valve is tricuspid Aortic valve regurgitation is  trivial by color flow Doppler. There is no stenosis of the aortic valve.    Pulmonic Valve: The pulmonic valve was normal in structure.  Pulmonic valve regurgitation is trivial by color flow Doppler.    Aorta: The aortic root and ascending aorta are normal in size and  structure. There is evidence of plaque in the descending aorta; Grade IV,  measuring >53m in size.   Shunts: There is no evidence of an atrial septal defect   EKG:  EKG is ordered today.  The ekg ordered today demonstrates NSR 68 bpm with occasional PVC. Nonspecific T wave changes. No acute ST/T wave changes.   Recent Labs: 09/17/2021: ALT 22 09/22/2021: Magnesium 2.2 09/27/2021: Hemoglobin 11.0; Platelets 173 09/28/2021: BUN 27; Creatinine, Ser 1.50; Potassium 4.2; Sodium 135  Recent Lipid Panel    Component Value Date/Time   CHOL 179 06/07/2021 1355   CHOL 92 (L) 05/11/2020 1020   TRIG 273.0 (H) 06/07/2021 1355   HDL 36.00 (L) 06/07/2021 1355   HDL 34 (L) 05/11/2020 1020   CHOLHDL 5 06/07/2021 1355   VLDL 54.6 (H) 06/07/2021 1355   LDLCALC 35 05/11/2020 1020   LDLDIRECT 124.0 06/07/2021 1355    Home Medications   Current Meds  Medication Sig   acetaminophen (TYLENOL) 325 MG tablet Take 2 tablets (650 mg total) by mouth every 6 (six) hours as  needed for mild pain, fever or headache.   aspirin EC 325 MG EC tablet Take 1 tablet (325 mg total) by mouth daily.   blood glucose meter kit and supplies Dispense based on patient and insurance preference. Use up to four times daily as directed. (FOR ICD-10 E10.9, E11.9).   gabapentin (NEURONTIN) 300 MG capsule Take 300 mg by mouth 3 (three) times daily.   glucose blood (ONETOUCH ULTRA) test strip USE 1 STRIP TO CHECK GLUCOSE TWICE DAILY .   insulin glargine (LANTUS) 100 UNIT/ML injection Inject 0.22-0.27 mLs (22-27 Units total) into the skin daily. 22-"normal blood sugar reading" 27-"elevated  blood sugar readings"   Insulin Pen Needle (TECHLITE PEN NEEDLES) 32G X 8 MM MISC Use daily to administer insulin   Lancets (ONETOUCH DELICA PLUS [id number]) MISC SMARTSIG:1 Topical Daily   rosuvastatin (CRESTOR) 40 MG tablet Take 1 tablet by mouth once daily   [DISCONTINUED] metoprolol tartrate (LOPRESSOR) 25 MG tablet Take 1 tablet (25 mg total) by mouth 2 (two) times daily.     Review of Systems      All other systems reviewed and are otherwise negative except as noted above.  Physical Exam    VS:  BP 116/68    Pulse 68    Ht 5' 5"  (1.651 m)    Wt 157 lb 6.4 oz (71.4 kg)    SpO2 97%    BMI 26.19 kg/m  , BMI Body mass index is 26.19 kg/m.  Wt Readings from Last 3 Encounters:  10/12/21 157 lb 6.4 oz (71.4 kg)  09/28/21 157 lb 3 oz (71.3 kg)  09/17/21 166 lb (75.3 kg)     GEN: Well nourished, well developed, in no acute distress. HEENT: normal. Neck: Supple, no JVD, carotid bruits, or masses. Cardiac: RRR, no murmurs, rubs, or gallops. No clubbing, cyanosis, edema.  Radials/PT 2+ and equal bilaterally.  Respiratory:  Respirations regular and unlabored, clear to auscultation bilaterally. GI: Soft, nontender, nondistended. MS: No deformity or atrophy. Skin: Warm and dry, no rash. Midsternal incision and RLE graft site with approximated edges and no erythema nor evidence of infection. 2 chest  tube sutures were still in place and were removed without complication in clinic today.  Neuro:  Strength and sensation are intact. Psych: Normal affect.  Assessment & Plan    CAD - Stable with no anginal symptoms. Surgical incisions healing appropriately. Chest tube sutures removed in clinic today. GDMT includes Aspirin 310m (for 3 mos post CABG then transition to 844m, Metoprolol Tartrate, Rosuvastatin. Heart healthy diet and regular cardiovascular exercise encouraged.  Encouraged to participate in cardiac rehab.   PVC - Monitor 07/21/21 with PVC burden 20%. Not taking Metoprolol at the time. EKG today with isolated PVC. No palpitations. Continue Metoprolol Tartrate 2510mID. Consider repeat monitor a few months post CABG to reassess PVC burden.   HFmrEF - Euvolemic and well compensated on exam. Pre-CABG TEE EF 40-45%. Post-CABG TEE with EF 45-50%. GDMT limited by hypotension. Present GDMT includes Metoprolol. Given relative hypotension today will defer resumption of Lisinopril. Could consider ACE/ARB/MRA at follow up. Low suspicion BP with tolerate ARNI.  HLD, LDL goal <70 - 05/2021 LDL 124. Continue Atorvastatin. Denies myalgias. Will update direct LDL today.   CKD - Careful titration of diuretic and antihypertensive.  CMP today for monitoring.   Cardiac Rehabilitation Eligibility Assessment  The patient is ready to start cardiac rehabilitation pending clearance from the cardiac surgeon. May require assistance with transportation.   Disposition: Follow up in 2-3 month(s) with MicSherren MochaD or APP.  Signed, CaiLoel DubonnetP 10/12/2021, 10:23 AM [place]

## 2021-10-12 NOTE — Patient Instructions (Signed)
Medication Instructions:  Your Physician recommend you continue on your current medication as directed.    *If you need a refill on your cardiac medications before your next appointment, please call your pharmacy*   Lab Work: Your physician recommends that you return for lab work today- CBC, CMET, and Direct LDL   If you have labs (blood work) drawn today and your tests are completely normal, you will receive your results only by: MyChart Message (if you have [facility]) OR A paper copy in the mail If you have any lab test that is abnormal or we need to change your treatment, we will call you to review the results.   Testing/Procedures: None ordered today    Follow-Up: At [facility], you and your health needs are our priority.  As part of our continuing mission to provide you with exceptional heart care, we have created designated Provider Care Teams.  These Care Teams include your primary Cardiologist (physician) and Advanced Practice Providers (APPs -  Physician Assistants and Nurse Practitioners) who all work together to provide you with the care you need, when you need it.  We recommend signing up for the patient portal called "MyChart".  Sign up information is provided on this After Visit Summary.  MyChart is used to connect with patients for Virtual Visits (Telemedicine).  Patients are able to view lab/test results, encounter notes, upcoming appointments, etc.  Non-urgent messages can be sent to your provider as well.   To learn more about what you can do with MyChart, go to [email or web address].    Your next appointment:   3 month(s)  The format for your next appointment:   In Person  Provider:   Sherren Mocha, MD  or Richardson Dopp, PA or Laurann Montana, NP    Other Instructions You have been referred to cardiac rehab. This is a combination program including monitored exercise, dietary education, and support group. We strongly recommend participating in the program.  Expect a phone call from them in approximately 2 weeks. If transportation is difficult they may be able to provide transportation services.   For coronary artery disease often called "heart disease" we aim for optimal guideline directed medical therapy. We use the "A, B, C"s to help keep [place] on track!  A = Aspirin 81mg  daily B = Beta blocker which helps to relax the heart. This is your Metoprolol. C = Cholesterol control. You take Rosuvastatin to help control your cholesterol.   Per your surgical team after open heart surgery you should not push/pull for 3 months. You should not lift more than 20 lbs until 3 months after surgery. If your surgeon has cleared you to drive, you should take breaks to mobilize every 1-2 hours if on a long trip.    Heart Healthy Diet Recommendations: A low-salt diet is recommended. Meats should be grilled, baked, or boiled. Avoid fried foods. Focus on lean protein sources like fish or chicken with vegetables and fruits. The American Heart Association is a Microbiologist!  American Heart Association Diet and Lifeystyle Recommendations    Exercise recommendations: The American Heart Association recommends 150 minutes of moderate intensity exercise weekly. Try 30 minutes of moderate intensity exercise 4-5 times per week. This could include walking, jogging, or swimming.

## 2021-10-13 ENCOUNTER — Telehealth (HOSPITAL_BASED_OUTPATIENT_CLINIC_OR_DEPARTMENT_OTHER): Payer: Self-pay

## 2021-10-13 LAB — CBC
Hematocrit: 41.6 % (ref 37.5–51.0)
Hemoglobin: 12.8 g/dL — ABNORMAL LOW (ref 13.0–17.7)
MCH: 27 pg (ref 26.6–33.0)
MCHC: 30.8 g/dL — ABNORMAL LOW (ref 31.5–35.7)
MCV: 88 fL (ref 79–97)
Platelets: 352 10*3/uL (ref 150–450)
RBC: 4.74 x10E6/uL (ref 4.14–5.80)
RDW: 13 % (ref 11.6–15.4)
WBC: 7.9 10*3/uL (ref 3.4–10.8)

## 2021-10-13 LAB — COMPREHENSIVE METABOLIC PANEL
ALT: 19 IU/L (ref 0–44)
AST: 18 IU/L (ref 0–40)
Albumin/Globulin Ratio: 1.6 (ref 1.2–2.2)
Albumin: 4.3 g/dL (ref 3.7–4.7)
Alkaline Phosphatase: 103 IU/L (ref 44–121)
BUN/Creatinine Ratio: 11 (ref 10–24)
BUN: 17 mg/dL (ref 8–27)
Bilirubin Total: 0.3 mg/dL (ref 0.0–1.2)
CO2: 27 mmol/L (ref 20–29)
Calcium: 9.5 mg/dL (ref 8.6–10.2)
Chloride: 105 mmol/L (ref 96–106)
Creatinine, Ser: 1.48 mg/dL — ABNORMAL HIGH (ref 0.76–1.27)
Globulin, Total: 2.7 g/dL (ref 1.5–4.5)
Glucose: 97 mg/dL (ref 70–99)
Potassium: 4.8 mmol/L (ref 3.5–5.2)
Sodium: 143 mmol/L (ref 134–144)
Total Protein: 7 g/dL (ref 6.0–8.5)
eGFR: 50 mL/min/{1.73_m2} — ABNORMAL LOW (ref >59)

## 2021-10-13 LAB — LDL CHOLESTEROL, DIRECT: LDL Direct: 53 mg/dL (ref 0–99)

## 2021-10-13 NOTE — Telephone Encounter (Addendum)
Patient called with interpreter, results given!   ----- Message from Loel Dubonnet, NP sent at 10/13/2021  8:09 AM EST ----- CBC show no infection and postoperative anemia is improving. Stable kidney function. Normal electrolytes and liver function. LDL (bad cholesterol) at goal. Good result!

## 2021-10-27 ENCOUNTER — Other Ambulatory Visit: Payer: Self-pay | Admitting: Thoracic Surgery (Cardiothoracic Vascular Surgery)

## 2021-10-27 DIAGNOSIS — Z951 Presence of aortocoronary bypass graft: Secondary | ICD-10-CM

## 2021-10-28 ENCOUNTER — Other Ambulatory Visit: Payer: Self-pay

## 2021-10-28 ENCOUNTER — Ambulatory Visit (INDEPENDENT_AMBULATORY_CARE_PROVIDER_SITE_OTHER): Payer: Self-pay | Admitting: Surgical

## 2021-10-28 ENCOUNTER — Ambulatory Visit
Admission: RE | Admit: 2021-10-28 | Discharge: 2021-10-28 | Disposition: A | Discharge status: Home / Self Care | Payer: Medicare Other | Source: Ambulatory Visit | Attending: Thoracic Surgery (Cardiothoracic Vascular Surgery) | Admitting: Thoracic Surgery (Cardiothoracic Vascular Surgery)

## 2021-10-28 VITALS — BP 121/72 | HR 69 | Resp 20 | Ht 65.0 in | Wt 157.0 lb

## 2021-10-28 DIAGNOSIS — Z951 Presence of aortocoronary bypass graft: Secondary | ICD-10-CM

## 2021-10-28 DIAGNOSIS — I2510 Atherosclerotic heart disease of native coronary artery without angina pectoris: Secondary | ICD-10-CM

## 2021-10-28 DIAGNOSIS — I700 Atherosclerosis of aorta: Secondary | ICD-10-CM | POA: Diagnosis not present

## 2021-10-28 NOTE — Progress Notes (Signed)
° °   °[address] °      [name] [address] °            [phone]   ° °  °Barry [name] °[facility] #[id number] °Date of Birth: 05/01/1951 ° °Referring: Smith, Henry W, MD °Primary Care: Copland, Jessica C, MD °Primary Cardiologist: Michael Cooper, MD ° ° °Chief Complaint:   POST OP FOLLOW UP °09/21/2021 °Patient:  Barry [name] °Pre-Op Dx: Coronary artery disease °                        Diabetes mellitus °                        Chronic renal insufficiency °                        Hypertension °                        Hyperlipidemia °Post-op Dx: Same °Procedure: °CABG X 4.  LIMA to LAD.  Reverse saphenous vein graft to PDA, first diagonal, and OM. °Endoscopic greater saphenous vein harvest on the right °  °  °Surgeon and Role:   °   * Lightfoot, Harrell O, MD - Primary °   *W. Gold, PA-C- assisting °History of Present Illness:    Patient is a 71-year-old male seen in the office on today's date and routine postsurgical follow-up following the above described procedure.  He is here with a translator.  He describes some intermittent incisional pain in both his leg as well as sternotomy incisions.  He has not had any fevers, chills or other significant constitutional symptoms.  He is not having chest pain related to his heart or anginal equivalent.  He is not having palpitations.  Has had no difficulties with his incisions.  He is slowly increasing activities but does fatigue easily and tires in the early evening.  He feels as though his gabapentin is also making him feel more tired and asked if he can stop it.  I told him that he can if he wants to.  He has taken Tylenol for pain. ° ° ° °  °Past Medical History:  °Diagnosis Date  ° Anginal pain (HCC)   ° CAD (coronary artery disease)   ° LHC (02/26/14):  >> PCI:  Xience DES to pRCA // LHC (03/10/14):  >> PCI:  Xience DES to mCFX // Myoview 2/16: low risk // Cath 2016: patent stents; stable dz - med Rx // Myoview 10/22: anterolateral  ischemia, inferoapical infarct w peri infarct ischemia; EF 41; High Risk // Cath 12/22: LCx stent occl, severe LAD and Dx dz, diffuse RCA disease >> CABG  ° Carotid stenosis   ° a. Carotid US (02/27/14):  Bilateral ICA 1-39%  ° CKD (chronic kidney disease) stage 3   ° Depression   ° Diabetes mellitus   ° 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HCC)   ° a. Mesenteric Artery Duplex (7/15):  pSMA chronic dissection with aneurysmal dilation of 1.29 cm (VVS);  b.  Chest CTA (02/26/14):  IMPRESSION:  1. No aortic  dissection or other acute abnormality.  2. 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  3. Stable 17 mm ectasia of the left common iliac artery.  4. Atherosclerosis, including aortoiliac and coronary artery disease.   ° HFmrEF (heart failure with mildly reduced EF)   09/2015  ° Echo 6/15: EF 50-55 // Echo 1/17: mod LVH, EF 55%, inf-lat HK, Gr 1 DD, mild LAE // Echocardiogram 10/22: EF 40-45, Gr 1 DD, mildly reduced RVSF, RVSP 26.6, trivial MR, trivial AI, post and ant-lat HK  ° Hyperlipidemia   ° Hypertension   ° Iliac artery aneurysm, left (HCC)   ° Myocardial infarction (HCC)   ° PVC's (premature ventricular contractions) 07/21/2021  ° Monitor 10/22:  NSR avg HR 71; no AF/Flutter; no high grade HB or pathologic pauses PVC burden 20%, rare supraventricular beats w/o sustained arrhythmia   ° Stroke (HCC) 09/26/2009  ° ° ° °Social History  ° °Tobacco Use  °Smoking Status Former  ° Packs/day: 0.25  ° Years: 38.00  ° Pack years: 9.50  ° Types: Cigarettes  ° Quit date: 2008  ° Years since quitting: 15.0  °Smokeless Tobacco Never  °  °Social History  ° °Substance and Sexual Activity  °Alcohol Use No  ° Alcohol/week: 0.0 standard drinks  ° ° ° °Allergies  °Allergen Reactions  ° Metformin And Related   °  Pt feels that this causes constipation and will not take   ° ° °Current Outpatient Medications  °Medication Sig Dispense Refill  ° acetaminophen (TYLENOL) 325 MG tablet Take 2 tablets (650 mg total) by mouth every 6 (six)  hours as needed for mild pain, fever or headache.    ° aspirin EC 325 MG EC tablet Take 1 tablet (325 mg total) by mouth daily.    ° blood glucose meter kit and supplies Dispense based on patient and insurance preference. Use up to four times daily as directed. (FOR ICD-10 E10.9, E11.9). 1 each 0  ° gabapentin (NEURONTIN) 300 MG capsule Take 300 mg by mouth 3 (three) times daily.    ° glucose blood (ONETOUCH ULTRA) test strip USE 1 STRIP TO CHECK GLUCOSE TWICE DAILY . 100 each 3  ° insulin glargine (LANTUS) 100 UNIT/ML injection Inject 0.22-0.27 mLs (22-27 Units total) into the skin daily. 22-"normal blood sugar reading" °27-"elevated blood sugar readings" 10 mL 1  ° Insulin Pen Needle (TECHLITE PEN NEEDLES) 32G X 8 MM MISC Use daily to administer insulin 100 each 3  ° Lancets (ONETOUCH DELICA PLUS LANCET33G) MISC SMARTSIG:1 Topical Daily    ° metoprolol tartrate (LOPRESSOR) 25 MG tablet Take 1 tablet (25 mg total) by mouth 2 (two) times daily. 180 tablet 3  ° rosuvastatin (CRESTOR) 40 MG tablet Take 1 tablet by mouth once daily 90 tablet 3  ° °No current facility-administered medications for this visit.  ° ° ° ° ° °Physical Exam: °BP 121/72    Pulse 69    Resp 20    Ht 5' 5" (1.651 m)    Wt 157 lb (71.2 kg)    SpO2 96% Comment: RA   BMI 26.13 kg/m²  ° °General appearance: alert, cooperative, and no distress °Heart: regular rate and rhythm °Lungs: clear to auscultation bilaterally °Abdomen: Benign exam °Extremities: No edema °Wound: Incisions well-healed without evidence of infection. ° ° °Diagnostic Studies & Laboratory data: °   ° Recent Radiology Findings:  ° DG Chest 2 View ° °Result Date: 10/28/2021 °CLINICAL DATA:  Coronary artery disease, status post CABG 09/21/2021. EXAM: CHEST - 2 VIEW COMPARISON:  09/26/2021 FINDINGS: Prior CABG noted. Heart size within normal limits. Atherosclerotic calcification of the aortic arch. The lungs appear clear. No significant blunting of the costophrenic angles. IMPRESSION: 1.  Postoperative findings of prior CABG.  No cardiomegaly or edema.   edema. 2.  Atherosclerotic calcification of the aortic arch. Electronically Signed   By: Van Clines M.D.   On: 10/28/2021 14:47      Recent Lab Findings: Lab Results  Component Value Date   WBC 7.9 10/12/2021   HGB 12.8 (L) 10/12/2021   HCT 41.6 10/12/2021   PLT 352 10/12/2021   GLUCOSE 97 10/12/2021   CHOL 179 06/07/2021   TRIG 273.0 (H) 06/07/2021   HDL 36.00 (L) 06/07/2021   LDLDIRECT 53 10/12/2021   LDLCALC 35 05/11/2020   ALT 19 10/12/2021   AST 18 10/12/2021   NA 143 10/12/2021   K 4.8 10/12/2021   CL 105 10/12/2021   CREATININE 1.48 (H) 10/12/2021   BUN 17 10/12/2021   CO2 27 10/12/2021   TSH 1.34 06/07/2017   INR 1.4 (H) 09/21/2021   HGBA1C 8.9 (H) 09/17/2021      Assessment / Plan: The patient is doing well status post CABG his pain seems commensurate for level of postoperative convalescence.  I gave him further instructions on activity progression.  He does not drive a car.  He has a future appointment in mid months with cardiology which he is aware of.  I reviewed his chest x-ray and medications and did not make any changes to them currently other than saying if he does not want to take his gabapentin he does not have to.  We will see the patient again on a as needed basis for any surgically related needs or at request.      Medication Changes: No orders of the defined types were placed in this encounter.     John Giovanni, PA-C  10/28/2021 3:01 PM

## 2021-10-28 NOTE — Patient Instructions (Signed)
Given verbal instructions through the translator for activity progression and restrictions

## 2021-11-09 ENCOUNTER — Ambulatory Visit: Payer: Medicare Other

## 2021-11-09 NOTE — Progress Notes (Deleted)
Subjective:   Barry [name] is a 70 y.o. male who presents for an Initial Medicare Annual Wellness Visit.   Review of Systems    ***       Objective:    There were no vitals filed for this visit. There is no height or weight on file to calculate BMI.  Advanced Directives 09/24/2021 09/17/2021 08/31/2021 06/10/2021 05/15/2020 05/04/2020 12/19/2019  Does Patient Have a Medical Advance Directive? No No No No No No No  Would patient like information on creating a medical advance directive? No - Patient declined No - Patient declined No - Patient declined - No - Patient declined - -  Pre-existing out of facility DNR order (yellow form or pink MOST form) - - - - - - -    Current Medications (verified) Outpatient Encounter Medications as of 11/09/2021  Medication Sig   acetaminophen (TYLENOL) 325 MG tablet Take 2 tablets (650 mg total) by mouth every 6 (six) hours as needed for mild pain, fever or headache.   aspirin EC 325 MG EC tablet Take 1 tablet (325 mg total) by mouth daily.   blood glucose meter kit and supplies Dispense based on patient and insurance preference. Use up to four times daily as directed. (FOR ICD-10 E10.9, E11.9).   gabapentin (NEURONTIN) 300 MG capsule Take 300 mg by mouth 3 (three) times daily.   glucose blood (ONETOUCH ULTRA) test strip USE 1 STRIP TO CHECK GLUCOSE TWICE DAILY .   insulin glargine (LANTUS) 100 UNIT/ML injection Inject 0.22-0.27 mLs (22-27 Units total) into the skin daily. 22-"normal blood sugar reading" 27-"elevated blood sugar readings"   Insulin Pen Needle (TECHLITE PEN NEEDLES) 32G X 8 MM MISC Use daily to administer insulin   Lancets (ONETOUCH DELICA PLUS ONGEXB28U) MISC SMARTSIG:1 Topical Daily   metoprolol tartrate (LOPRESSOR) 25 MG tablet Take 1 tablet (25 mg total) by mouth 2 (two) times daily.   rosuvastatin (CRESTOR) 40 MG tablet Take 1 tablet by mouth once daily   No facility-administered encounter medications on file as of 11/09/2021.     Allergies (verified) Metformin and related   History: Past Medical History:  Diagnosis Date   Anginal pain ([place])    CAD (coronary artery disease)    LHC (02/26/14):  >> PCI:  Xience DES to pRCA // LHC (03/10/14):  >> PCI:  Xience DES to [facility] // Myoview 2/16: low risk // Cath 2016: patent stents; stable dz - med Rx // Myoview 10/22: anterolateral ischemia, inferoapical infarct w peri infarct ischemia; EF 41; High Risk // Cath 12/22: LCx stent occl, severe LAD and Dx dz, diffuse RCA disease >> CABG   Carotid stenosis    a. Carotid US (02/27/14):  Bilateral ICA 1-39%   CKD (chronic kidney disease) stage 3    Depression    Diabetes mellitus    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[place])    a. Mesenteric Artery Duplex (7/15):  pSMA chronic dissection with aneurysmal dilation of 1.29 cm (VVS);  b.  Chest CTA (02/26/14):  IMPRESSION:  1. No aortic  dissection or other acute abnormality.  2. 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  3. Stable 17 mm ectasia of the left common iliac artery.  4. Atherosclerosis, including aortoiliac and coronary artery disease.    HFmrEF (heart failure with mildly reduced EF) 09/2015   Echo 6/15: EF 50-55 // Echo 1/17: mod LVH, EF 55%, inf-lat HK, Gr 1 DD, mild LAE // Echocardiogram 10/22: EF 40-45, Gr 1 DD, mildly reduced  RVSF, RVSP 26.6, trivial MR, trivial AI, post and ant-lat HK   Hyperlipidemia    Hypertension    Iliac artery aneurysm, left (HCC)    Myocardial infarction (HCC)    PVC's (premature ventricular contractions) 07/21/2021   Monitor 10/22:  NSR avg HR 71; no AF/Flutter; no high grade HB or pathologic pauses PVC burden 20%, rare supraventricular beats w/o sustained arrhythmia    Stroke ([place]) 09/26/2009   Past Surgical History:  Procedure Laterality Date   CARDIAC CATHETERIZATION     CARDIAC CATHETERIZATION N/A 06/25/2015   Procedure: Left Heart Cath and Coronary Angiography;  Surgeon: Burnell Blanks, MD;  Location: [facility] [facility];   Service: Cardiovascular;  Laterality: N/A;   COLONOSCOPY WITH PROPOFOL N/A 07/23/2015   Procedure: COLONOSCOPY WITH PROPOFOL;  Surgeon: [name], MD;  Location: [facility];  Service: Endoscopy;  Laterality: N/A;   CORONARY ANGIOPLASTY     CORONARY ARTERY BYPASS GRAFT N/A 09/21/2021   Procedure: CORONARY ARTERY BYPASS GRAFTING (CABG)  X FOUR, ON PUMP, USING LEFT INTERNAL MAMMARY ARTERY AND RIGHT ENDOSCOPIC GREATER SAPHENOUS VEIN CONDUITS;  Surgeon: Lajuana Matte, MD;  Location: [facility];  Service: Open Heart Surgery;  Laterality: N/A;   ENDOVEIN HARVEST OF GREATER SAPHENOUS VEIN  09/21/2021   Procedure: ENDOVEIN HARVEST OF GREATER SAPHENOUS VEIN;  Surgeon: Lajuana Matte, MD;  Location: [facility];  Service: Open Heart Surgery;;   LEFT HEART CATH AND CORONARY ANGIOGRAPHY N/A 08/31/2021   Procedure: LEFT HEART CATH AND CORONARY ANGIOGRAPHY;  Surgeon: Belva Crome, MD;  Location: [facility];  Service: Cardiovascular;  Laterality: N/A;   LEFT HEART CATHETERIZATION WITH CORONARY ANGIOGRAM N/A 02/26/2014   Procedure: LEFT HEART CATHETERIZATION WITH CORONARY ANGIOGRAM;  Surgeon: Wellington Hampshire, MD;  Location: [facility];  Service: Cardiovascular;  Laterality: N/A;   LEFT HEART CATHETERIZATION WITH CORONARY ANGIOGRAM N/A 03/10/2014   Procedure: LEFT HEART CATHETERIZATION WITH CORONARY ANGIOGRAM;  Surgeon: Jettie Booze, MD;  [facility];  Service: Cardiovascular;  Laterality: N/A;   TEE WITHOUT CARDIOVERSION N/A 09/21/2021   Procedure: TRANSESOPHAGEAL ECHOCARDIOGRAM (TEE);  Surgeon: Lajuana Matte, MD;  Location: [facility];  Service: Open Heart Surgery;  Laterality: N/A;   Family History  Problem Relation Age of Onset   Diabetes Father    Hypertension Father    Heart attack Father    Stroke Neg Hx    Social History   Socioeconomic History   Marital status: Married    Spouse name: Not on file   Number of children: 3   Years of education: Not on file    Highest education level: Not on file  Occupational History   Occupation: Retired  Tobacco Use   Smoking status: Former    Packs/day: 0.25    Years: 38.00    Pack years: 9.50    Types: Cigarettes    Quit date: 2008    Years since quitting: 15.1   Smokeless tobacco: Never  Vaping Use   Vaping Use: Never used  Substance and Sexual Activity   Alcohol use: No    Alcohol/week: 0.0 standard drinks   Drug use: No   Sexual activity: Yes  Other Topics Concern   Not on file  Social History Narrative   Marital: married.      Lives: with son,wife.       Children: 3 children; 6 grandchildren      Employed: unemployed; disability unknown reason/CVA L sided weakness  Tobacco:  Quit 2012; smoked 40 years.       Alcohol: no drinking now; social in past.       Drugs: none       Exercise: sporadic.       ADLs:  No driving since CVA.   Social Determinants of Health   Financial Resource Strain: Low Risk    Difficulty of Paying Living Expenses: Not very hard  Food Insecurity: Not on file  Transportation Needs: No Transportation Needs   Lack of Transportation (Medical): No   Lack of Transportation (Non-Medical): No  Physical Activity: Not on file  Stress: Not on file  Social Connections: Not on file    Tobacco Counseling Counseling given: Not Answered   Clinical Intake:                 Diabetes:  Is the patient diabetic?  Yes  If diabetic, was a CBG obtained today?  No  Did the patient bring in their glucometer from home?  No  How often do you monitor your CBG's? ***.   Financial Strains and Diabetes Management:  Are you having any financial strains with the device, your supplies or your medication? {YES/NO:21197}.  Does the patient want to be seen by Chronic Care Management for management of their diabetes?  {YES/NO:21197} Would the patient like to be referred to a Nutritionist or for Diabetic Management?  {YES/NO:21197}  Diabetic Exams:  Diabetic Eye Exam:  Completed ***. Overdue for diabetic eye exam. Pt has been advised about the importance in completing this exam. A referral has been placed today. Message sent to referral coordinator for scheduling purposes. Advised pt to expect a call from our office re: appt.  Diabetic Foot Exam: Completed 06/07/2021.           Activities of Daily Living In your present state of health, do you have any difficulty performing the following activities: 09/24/2021 09/17/2021  Hearing? N Y  Vision? N N  Difficulty concentrating or making decisions? N Y  Walking or climbing stairs? Y Y  Dressing or bathing? N N  Doing errands, shopping? N N  Some recent data might be hidden    Patient Care Team: Copland, Gay Filler, MD as PCP - General (Family Medicine) Sherren Mocha, MD as PCP - Cardiology (Cardiology) Elayne Snare, MD as Consulting Physician (Endocrinology) Sharmon Revere as Physician Assistant (Cardiology) Cherre Robins, RPH-CPP (Pharmacist)  Indicate any recent Medical Services you may have received from other than [facility] providers in the past year (date may be approximate).     Assessment:   This is a routine wellness examination for [place].  Hearing/Vision screen No results found.  Dietary issues and exercise activities discussed:     Goals Addressed   None    Depression Screen PHQ 2/9 Scores 06/07/2021 05/18/2017 05/09/2017 11/25/2016 10/18/2016 10/11/2016 10/07/2015  PHQ - 2 Score 0 1 0 0 0 0 0    Fall Risk Fall Risk  06/07/2021 08/13/2018 07/11/2017 06/28/2017 05/18/2017  Falls in the past year? 0 0 Yes Yes Yes  Comment - [facility] Telephone Survey: data to providers prior to load no falls in the last month no falls in the  last month -  Number falls in past yr: - - 1 1 1   Injury with Fall? - - No No No  Follow up - - - - -    FALL RISK PREVENTION PERTAINING TO THE HOME:  Any stairs in or around the home? {YES/NO:21197} If [place],  are there any without handrails? {YES/NO:21197} Home free  of loose throw rugs in walkways, pet beds, electrical cords, etc? {YES/NO:21197} Adequate lighting in your home to reduce risk of falls? {YES/NO:21197}  ASSISTIVE DEVICES UTILIZED TO PREVENT FALLS:  Life alert? {YES/NO:21197} Use of a cane, walker or w/c? {YES/NO:21197} Grab bars in the bathroom? {YES/NO:21197} Shower chair or bench in shower? {YES/NO:21197} Elevated toilet seat or a handicapped toilet? {YES/NO:21197}  TIMED UP AND GO:  Was the test performed? {YES/NO:21197}.  Length of time to ambulate 10 feet: *** sec.   {Appearance of WSFK:8127517}  Cognitive Function:        Immunizations Immunization History  Administered Date(s) Administered   Fluad Quad(high Dose 65+) 07/23/2020   Influenza, High Dose Seasonal PF 11/15/2017, 06/20/2018, 06/07/2021   PFIZER(Purple Top)SARS-COV-2 Vaccination 02/18/2020, 04/06/2020   Pneumococcal Conjugate-13 11/15/2017   Pneumococcal Polysaccharide-23 02/28/2014, 07/23/2020   Tdap 06/07/2021    TDAP status: Up to date  Flu Vaccine status: Up to date  Pneumococcal vaccine status: Up to date  {Covid-19 vaccine status:2101808}  Qualifies for Shingles Vaccine? Yes   Zostavax completed No   Shingrix Completed?: No.    Education has been provided regarding the importance of this vaccine. Patient has been advised to call insurance company to determine out of pocket expense if they have not yet received this vaccine. Advised may also receive vaccine at local pharmacy or Health Dept. Verbalized acceptance and understanding.  Screening Tests Health Maintenance  Topic Date Due   OPHTHALMOLOGY EXAM  Never done   Zoster Vaccines- Shingrix (1 of 2) Never done   URINE MICROALBUMIN  10/11/2017   COVID-19 Vaccine (3 - Pfizer risk series) 05/04/2020   HEMOGLOBIN A1C  03/18/2022   FOOT EXAM  06/07/2022   COLONOSCOPY (Pts 45-66yr Insurance coverage will need to be confirmed)  07/22/2025   TETANUS/TDAP  06/08/2031   Pneumonia Vaccine 72  Years old  Completed   INFLUENZA VACCINE  Completed   Hepatitis C Screening  Completed   HPV VACCINES  Aged Out    Health Maintenance  Health Maintenance Due  Topic Date Due   OPHTHALMOLOGY EXAM  Never done   Zoster Vaccines- Shingrix (1 of 2) Never done   URINE MICROALBUMIN  10/11/2017   COVID-19 Vaccine (3 - Pfizer risk series) 05/04/2020    Colorectal cancer screening: Type of screening: Colonoscopy. Completed 07/23/2015. Repeat every 10 years  Lung Cancer Screening: (Low Dose CT Chest recommended if Age 72-80years, 30 pack-year currently smoking OR have quit w/in 15years.) does not qualify.     Additional Screening:  Hepatitis C Screening: Completed 09/05/2020  Vision Screening: Recommended annual ophthalmology exams for early detection of glaucoma and other disorders of the eye. Is the patient up to date with their annual eye exam?  {YES/NO:21197} Who is the provider or what is the name of the office in which the patient attends annual eye exams? *** If pt is not established with a provider, would they like to be referred to a provider to establish care? {YES/NO:21197}.   Dental Screening: Recommended annual dental exams for proper oral hygiene  Community Resource Referral / Chronic Care Management: CRR required this visit?  {YES/NO:21197}  CCM required this visit?  {YES/NO:21197}     Plan:     I have personally reviewed and noted the following in the patients chart:   Medical and social history Use of alcohol, tobacco or illicit drugs  Current medications and supplements including opioid prescriptions. {Opioid Prescriptions:[phone]} Functional ability  and status Nutritional status Physical activity Advanced directives List of other physicians Hospitalizations, surgeries, and ER visits in previous 12 months Vitals Screenings to include cognitive, depression, and falls Referrals and appointments  In addition, I have reviewed and discussed with  patient certain preventive protocols, quality metrics, and best practice recommendations. A written personalized care plan for preventive services as well as general preventive health recommendations were provided to patient.   Due to this being a telephonic visit, the after visit summary with patients personalized plan was offered to patient via mail or my-chart. ***Patient declined at this time./ Patient would like to access on my-chart/ per request, patient was mailed a copy of AVS./ Patient preferred to pick up at office at next visit.   Marta Antu, LPN   [id number]  Nurse Health Advisor  Nurse Notes: ***

## 2021-11-12 ENCOUNTER — Ambulatory Visit: Payer: Medicare Other

## 2021-11-12 ENCOUNTER — Ambulatory Visit (INDEPENDENT_AMBULATORY_CARE_PROVIDER_SITE_OTHER): Payer: Medicare Other

## 2021-11-12 VITALS — BP 132/70 | HR 61 | Temp 98.5°F | Resp 16 | Ht 65.0 in | Wt 165.0 lb

## 2021-11-12 DIAGNOSIS — Z0000 Encounter for general adult medical examination without abnormal findings: Secondary | ICD-10-CM

## 2021-11-12 NOTE — Progress Notes (Signed)
Subjective:   Barry [name] is a 71 y.o. male who presents for an Initial Medicare Annual Wellness Visit.   Interpreter (video/audio)  used during this visit. Interpreter # [phone] & Interpreter # [phone]  Review of Systems     Cardiac Risk Factors include: advanced age (>98mn, >>10women);male gender;dyslipidemia;diabetes mellitus;hypertension     Objective:    Today's Vitals   11/12/21 0815  BP: 132/70  Pulse: 61  Resp: 16  Temp: 98.5 F (36.9 C)  TempSrc: Oral  SpO2: 97%  Weight: 165 lb (74.8 kg)  Height: _0  (1.651 m)   Body mass index is 27.46 kg/m.  Advanced Directives 11/12/2021 09/24/2021 09/17/2021 08/31/2021 06/10/2021 05/15/2020 05/04/2020  Does Patient Have a Medical Advance Directive? _1  No No  Does patient want to make changes to medical advance directive? No - Patient declined - - - - - -  Would patient like information on creating a medical advance directive? - No - Patient declined No - Patient declined No - Patient declined - No - Patient declined -  Pre-existing out of facility DNR order (yellow form or pink MOST form) - - - - - - -    Current Medications (verified) Outpatient Encounter Medications as of 11/12/2021  Medication Sig   acetaminophen (TYLENOL) 325 MG tablet Take 2 tablets (650 mg total) by mouth every 6 (six) hours as needed for mild pain, fever or headache.   aspirin EC 325 MG EC tablet Take 1 tablet (325 mg total) by mouth daily.   blood glucose meter kit and supplies Dispense based on patient and insurance preference. Use up to four times daily as directed. (FOR ICD-10 E10.9, E11.9).   gabapentin (NEURONTIN) 300 MG capsule Take 300 mg by mouth 3 (three) times daily.   glucose blood (ONETOUCH ULTRA) test strip USE 1 STRIP TO CHECK GLUCOSE TWICE DAILY .   insulin glargine (LANTUS) 100 UNIT/ML injection Inject 0.22-0.27 mLs (22-27 Units total) into the skin daily. 22-"normal blood sugar reading" 27-"elevated blood sugar readings"    Insulin Pen Needle (TECHLITE PEN NEEDLES) 32G X 8 MM MISC Use daily to administer insulin   Lancets (ONETOUCH DELICA PLUS [id number] MISC SMARTSIG:1 Topical Daily   metoprolol tartrate (LOPRESSOR) 25 MG tablet Take 1 tablet (25 mg total) by mouth 2 (two) times daily.   rosuvastatin (CRESTOR) 40 MG tablet Take 1 tablet by mouth once daily   No facility-administered encounter medications on file as of 11/12/2021.    Allergies (verified) Metformin and related   History: Past Medical History:  Diagnosis Date   [place]inal pain ([place]    CAD (coronary artery disease)    LHC (02/26/14):  >> PCI:  Xience DES to pRCA // LHC (03/10/14):  >> [facility]  [facility] Myoview 2/16: low risk // Cath 2016: patent stents; stable dz - med Rx // Myoview 10/22: anterolateral ischemia, inferoapical infarct w peri infarct ischemia; EF 41; High Risk // Cath 12/22: LCx stent occl, severe LAD and Dx dz, diffuse RCA disease >> CABG   Carotid stenosis    a. Carotid [place](02/27[place]14):  Bilateral ICA 1-39%   CKD (chronic kidney disease) stage 3    Depression    Diabetes mellitus    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[place]    a. Mesenteric Artery Duplex (7/15):  pSMA chronic dissection with aneurysmal dilation of 1.29 cm (VVS);  b.  Chest CTA (02/26/14):  IMPRESSION:  1. No aortic  dissection or other  acute abnormality.  2. 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  3. Stable 17 mm ectasia of the left common iliac artery.  4. Atherosclerosis, including aortoiliac and coronary artery disease.    HFmrEF (heart failure with mildly reduced EF) 09/2015   Echo 6/15: EF 50-55 // Echo 1/17: mod LVH, EF 55%, inf-lat HK, Gr 1 DD, mild LAE // Echocardiogram 10/22: EF 40-45, Gr 1 DD, mildly reduced RVSF, RVSP 26.6, trivial MR, trivial AI, post and ant-lat HK   Hyperlipidemia    Hypertension    Iliac artery aneurysm, left (HCC)    Myocardial infarction (HCC)    PVC's (premature ventricular contractions) 07/21/2021   Monitor 10/22:   NSR avg HR 71; no AF/Flutter; no high grade HB or pathologic pauses PVC burden 20%, rare supraventricular beats w/o sustained arrhythmia    Stroke (HCC) 09/26/2009   Past Surgical History:  Procedure Laterality Date   CARDIAC CATHETERIZATION     CARDIAC CATHETERIZATION N/A 06/25/2015   Procedure: Left Heart Cath and Coronary Angiography;  Surgeon: Kathleene Hazel, MD;  Location: [facility] INVASIVE CV LAB;  Service: Cardiovascular;  Laterality: N/A;   COLONOSCOPY WITH PROPOFOL N/A 07/23/2015   Procedure: COLONOSCOPY WITH PROPOFOL;  Surgeon: Rachael Fee, MD;  Location: [facility] ENDOSCOPY;  Service: Endoscopy;  Laterality: N/A;   CORONARY ANGIOPLASTY     CORONARY ARTERY BYPASS GRAFT N/A 09/21/2021   Procedure: CORONARY ARTERY BYPASS GRAFTING (CABG)  X FOUR, ON PUMP, USING LEFT INTERNAL MAMMARY ARTERY AND RIGHT ENDOSCOPIC GREATER SAPHENOUS VEIN CONDUITS;  Surgeon: Corliss Skains, MD;  Location: [facility] OR;  Service: Open Heart Surgery;  Laterality: N/A;   ENDOVEIN HARVEST OF GREATER SAPHENOUS VEIN  09/21/2021   Procedure: ENDOVEIN HARVEST OF GREATER SAPHENOUS VEIN;  Surgeon: Corliss Skains, MD;  Location: [facility] OR;  Service: Open Heart Surgery;;   LEFT HEART CATH AND CORONARY ANGIOGRAPHY N/A 08/31/2021   Procedure: LEFT HEART CATH AND CORONARY ANGIOGRAPHY;  Surgeon: Lyn Records, MD;  Location: [facility] INVASIVE CV LAB;  Service: Cardiovascular;  Laterality: N/A;   LEFT HEART CATHETERIZATION WITH CORONARY ANGIOGRAM N/A 02/26/2014   Procedure: LEFT HEART CATHETERIZATION WITH CORONARY ANGIOGRAM;  Surgeon: Iran Ouch, MD;  Location: [facility] CATH LAB;  Service: Cardiovascular;  Laterality: N/A;   LEFT HEART CATHETERIZATION WITH CORONARY ANGIOGRAM N/A 03/10/2014   Procedure: LEFT HEART CATHETERIZATION WITH CORONARY ANGIOGRAM;  Surgeon: Corky Crafts, MD;  [facility] LAB;  Service: Cardiovascular;  Laterality: N/A;   TEE WITHOUT CARDIOVERSION N/A 09/21/2021   Procedure: TRANSESOPHAGEAL  ECHOCARDIOGRAM (TEE);  Surgeon: Corliss Skains, MD;  Location: [facility] OR;  Service: Open Heart Surgery;  Laterality: N/A;   Family History  Problem Relation Age of Onset   Diabetes Father    Hypertension Father    Heart attack Father    Stroke Neg Hx    Social History   Socioeconomic History   Marital status: Married    Spouse name: Not on file   Number of children: 3   Years of education: Not on file   Highest education level: Not on file  Occupational History   Occupation: Retired  Tobacco Use   Smoking status: Former    Packs/day: 0.25    Years: 38.00    Pack years: 9.50    Types: Cigarettes    Quit date: 2008    Years since quitting: 15.1   Smokeless tobacco: Never  Vaping Use   Vaping Use: Never used  Substance and Sexual Activity  Alcohol use: No    Alcohol/week: 0.0 standard drinks   Drug use: No   Sexual activity: Yes  Other Topics Concern   Not on file  Social History Narrative   Marital: married.      Lives: with son,wife.       Children: 3 children; 6 grandchildren      Employed: unemployed; disability unknown reason/CVA L sided weakness      Tobacco:  Quit 2012; smoked 40 years.       Alcohol: no drinking now; social in past.       Drugs: none       Exercise: sporadic.       ADLs:  No driving since CVA.   Social Determinants of Health   Financial Resource Strain: Low Risk    Difficulty of Paying Living Expenses: Not very hard  Food Insecurity: Food Insecurity Present   Worried About Running Out of Food in the Last Year: Sometimes true   Ran Out of Food in the Last Year: Never true  Transportation Needs: No Transportation Needs   Lack of Transportation (Medical): No   Lack of Transportation (Non-Medical): No  Physical Activity: Inactive   Days of Exercise per Week: 0 days   Minutes of Exercise per Session: 0 min  Stress: Not on file  Social Connections: Not on file    Tobacco Counseling Counseling given: Not Answered   Clinical  Intake:  Pre-visit preparation completed: Yes  Pain : No/denies pain     BMI - recorded: 27.46 Nutritional Status: BMI 25 -29 Overweight Nutritional Risks: None Diabetes: Yes CBG done?: No Did pt. bring in CBG monitor from home?: No  How often do you need to have someone help you when you read instructions, pamphlets, or other written materials from your doctor or pharmacy?: 1 - Never  Diabetes:  Is the patient diabetic?  Yes  If diabetic, was a CBG obtained today?  No  Did the patient bring in their glucometer from home?  No  How often do you monitor your CBG's? daily.   Financial Strains and Diabetes Management:  Are you having any financial strains with the device, your supplies or your medication? No .  Does the patient want to be seen by Chronic Care Management for management of their diabetes?  No  Would the patient like to be referred to a Nutritionist or for Diabetic Management?  No   Diabetic Exams:  Diabetic Eye Exam: . Overdue for diabetic eye exam. Pt has been advised about the importance in completing this exam.   Diabetic Foot Exam: Completed 06/07/2021.    Interpreter Needed?: Yes Interpreter Agency: [employer] Interpreter ID: [phone] Patient Declined Interpreter : No  Information entered by :: Caroleen Hamman   Activities of Daily Living In your present state of health, do you have any difficulty performing the following activities: 11/12/2021 09/24/2021  Hearing? Y N  Vision? N N  Difficulty concentrating or making decisions? Y N  Comment occasionally -  Walking or climbing stairs? Y Y  Dressing or bathing? N N  Doing errands, shopping? Y N  Preparing Food and eating ? N -  Using the Toilet? N -  In the past six months, have you accidently leaked urine? N -  Do you have problems with loss of bowel control? N -  Managing your Medications? N -  Managing your Finances? N -  Housekeeping or managing your Housekeeping? N -  Some  recent data might  be hidden    Patient Care Team: Copland, Gay Filler, MD as PCP - General (Family Medicine) Sherren Mocha, MD as PCP - Cardiology (Cardiology) Elayne Snare, MD as Consulting Physician (Endocrinology) Sharmon Revere as Physician Assistant (Cardiology) Cherre Robins, RPH-CPP (Pharmacist)  Indicate any recent Medical Services you may have received from other than [facility] providers in the past year (date may be approximate).     Assessment:   This is a routine wellness examination for Shein.  Hearing/Vision screen Hearing Screening - Comments:: Wife says he does have some hearing loss-does not wear hearing aids Vision Screening - Comments:: Last eye exam-2 years  Dietary issues and exercise activities discussed: Current Exercise Habits: The patient does not participate in regular exercise at present, Exercise limited by: None identified   Goals Addressed             This Visit's Progress    Patient Stated       Maintain current health       Depression Screen PHQ 2/9 Scores 11/12/2021 06/07/2021 05/18/2017 05/09/2017 11/25/2016 10/18/2016 10/11/2016  PHQ - 2 Score 0 0 1 0 0 0 0    Fall Risk Fall Risk  11/12/2021 06/07/2021 08/13/2018 07/11/2017 06/28/2017  Falls in the past year? 0 0 0 Yes Yes  Comment - - Emmi Telephone Survey: data to providers prior to load no falls in the last month no falls in the  last month  Number falls in past yr: 0 - - 1 1  Injury with Fall? 0 - - No No  Follow up Falls prevention discussed - - - -    FALL RISK PREVENTION PERTAINING TO THE HOME:  Any stairs in or around the home? No  Home free of loose throw rugs in walkways, pet beds, electrical cords, etc? Yes  Adequate lighting in your home to reduce risk of falls? Yes   ASSISTIVE DEVICES UTILIZED TO PREVENT FALLS:  Life alert? No  Use of a cane, walker or w/c? No  Grab bars in the bathroom? Yes  Shower chair or bench in shower? No  Elevated toilet seat or a handicapped  toilet? No   TIMED UP AND GO:  Was the test performed? Yes .  Length of time to ambulate 10 feet: 11 sec.   Gait steady and fast without use of assistive device  Cognitive Function:Normal cognitive status assessed by direct observation by this Nurse Health Advisor. No abnormalities found.          Immunizations Immunization History  Administered Date(s) Administered   Fluad Quad(high Dose 65+) 07/23/2020   Influenza, High Dose Seasonal PF 11/15/2017, 06/20/2018, 06/07/2021   PFIZER(Purple Top)SARS-COV-2 Vaccination 02/18/2020, 04/06/2020   Pneumococcal Conjugate-13 11/15/2017   Pneumococcal Polysaccharide-23 02/28/2014, 07/23/2020   Tdap 06/07/2021    TDAP status: Up to date  Flu Vaccine status: Up to date  Pneumococcal vaccine status: Up to date  Covid-19 vaccine status: Completed vaccines  Qualifies for Shingles Vaccine? Yes   Zostavax completed No   Shingrix Completed?: No.    Education has been provided regarding the importance of this vaccine. Patient has been advised to call insurance company to determine out of pocket expense if they have not yet received this vaccine. Advised may also receive vaccine at local pharmacy or Health Dept. Verbalized acceptance and understanding.  Screening Tests Health Maintenance  Topic Date Due   OPHTHALMOLOGY EXAM  Never done   Zoster Vaccines- Shingrix (1 of 2) Never done   URINE MICROALBUMIN  10/11/2017   COVID-19 Vaccine (3 - Pfizer risk series) 05/04/2020   HEMOGLOBIN A1C  03/18/2022   FOOT EXAM  06/07/2022   COLONOSCOPY (Pts 45-59yr Insurance coverage will need to be confirmed)  07/22/2025   TETANUS/TDAP  06/08/2031   Pneumonia Vaccine 72 Years old  Completed   INFLUENZA VACCINE  Completed   Hepatitis C Screening  Completed   HPV VACCINES  Aged Out    Health Maintenance  Health Maintenance Due  Topic Date Due   OPHTHALMOLOGY EXAM  Never done   Zoster Vaccines- Shingrix (1 of 2) Never done   URINE MICROALBUMIN   10/11/2017   COVID-19 Vaccine (3 - Pfizer risk series) 05/04/2020    Colorectal cancer screening: Type of screening: Colonoscopy. Completed 07/23/2015. Repeat every 10 years  Lung Cancer Screening: (Low Dose CT Chest recommended if Age 72-80years, 30 pack-year currently smoking OR have quit w/in 15years.) does not qualify.     Additional Screening:  Hepatitis C Screening: Completed 09/05/2020  Vision Screening: Recommended annual ophthalmology exams for early detection of glaucoma and other disorders of the eye. Is the patient up to date with their annual eye exam?  No  Who is the provider or what is the name of the office in which the patient attends annual eye exams? Unsure of name-patient to make an appt soon   Dental Screening: Recommended annual dental exams for proper oral hygiene  Community Resource Referral / Chronic Care Management: CRR required this visit?  No   CCM required this visit?  No      Plan:     I have personally reviewed and noted the following in the patients chart:   Medical and social history Use of alcohol, tobacco or illicit drugs  Current medications and supplements including opioid prescriptions. Patient is not currently taking opioid prescriptions. Functional ability and status Nutritional status Physical activity Advanced directives List of other physicians Hospitalizations, surgeries, and ER visits in previous 12 months Vitals Screenings to include cognitive, depression, and falls Referrals and appointments  In addition, I have reviewed and discussed with patient certain preventive protocols, quality metrics, and best practice recommendations. A written personalized care plan for preventive services as well as general preventive health recommendations were provided to patient.     MMarta Antu LPN   23/69/2230 Nurse Health Advisor  Nurse Notes: None

## 2021-11-12 NOTE — Patient Instructions (Signed)
Mr. Barry [name] , Thank you for taking time to come for your Medicare Wellness Visit. I appreciate your ongoing commitment to your health goals. Please review the following plan we discussed and let me know if I can assist you in the future.   Screening recommendations/referrals: Colonoscopy: Completed 07/23/2015-Due 07/22/2025 Recommended yearly ophthalmology/optometry visit for glaucoma screening and checkup Recommended yearly dental visit for hygiene and checkup  Vaccinations: Influenza vaccine: Up to date Pneumococcal vaccine: Up to date Tdap vaccine: Up to date Shingles vaccine: May obtain vaccine at your local pharmacy. Covid-19: Up to date  Advanced directives: Declined  Conditions/risks identified: See problem list  Next appointment: Follow up in one year for your annual wellness visit.   Preventive Care 72 Years and Older, Male Preventive care refers to lifestyle choices and visits with your health care provider that can promote health and wellness. What does preventive care include? A yearly physical exam. This is also called an annual well check. Dental exams once or twice a year. Routine eye exams. Ask your health care provider how often you should have your eyes checked. Personal lifestyle choices, including: Daily care of your teeth and gums. Regular physical activity. Eating a healthy diet. Avoiding tobacco and drug use. Limiting alcohol use. Practicing safe sex. Taking low doses of aspirin every day. Taking vitamin and mineral supplements as recommended by your health care provider. What happens during an annual well check? The services and screenings done by your health care provider during your annual well check will depend on your age, overall health, lifestyle risk factors, and family history of disease. Counseling  Your health care provider may ask you questions about your: Alcohol use. Tobacco use. Drug use. Emotional well-being. Home and relationship  well-being. Sexual activity. Eating habits. History of falls. Memory and ability to understand (cognition). Work and work Statistician. Screening  You may have the following tests or measurements: Height, weight, and BMI. Blood pressure. Lipid and cholesterol levels. These may be checked every 5 years, or more frequently if you are over 21 years old. Skin check. Lung cancer screening. You may have this screening every year starting at age 55 if you have a 30-pack-year history of smoking and currently smoke or have quit within the past 15 years. Fecal occult blood test (FOBT) of the stool. You may have this test every year starting at age 37. Flexible sigmoidoscopy or colonoscopy. You may have a sigmoidoscopy every 5 years or a colonoscopy every 10 years starting at age 27. Prostate cancer screening. Recommendations will vary depending on your family history and other risks. Hepatitis C blood test. Hepatitis B blood test. Sexually transmitted disease (STD) testing. Diabetes screening. This is done by checking your blood sugar (glucose) after you have not eaten for a while (fasting). You may have this done every 1-3 years. Abdominal aortic aneurysm (AAA) screening. You may need this if you are a current or former smoker. Osteoporosis. You may be screened starting at age 71 if you are at high risk. Talk with your health care provider about your test results, treatment options, and if necessary, the need for more tests. Vaccines  Your health care provider may recommend certain vaccines, such as: Influenza vaccine. This is recommended every year. Tetanus, diphtheria, and acellular pertussis (Tdap, Td) vaccine. You may need a Td booster every 10 years. Zoster vaccine. You may need this after age 61. Pneumococcal 13-valent conjugate (PCV13) vaccine. One dose is recommended after age 43. Pneumococcal polysaccharide (PPSV23) vaccine. One dose is  recommended after age 88. Talk to your health care  provider about which screenings and vaccines you need and how often you need them. This information is not intended to replace advice given to you by your health care provider. Make sure you discuss any questions you have with your health care provider. Document Released: 10/09/2015 Document Revised: 06/01/2016 Document Reviewed: 07/14/2015 Elsevier Interactive Patient Education  2017 [employer] Prevention in the Home Falls can cause injuries. They can happen to people of all ages. There are many things you can do to make your home safe and to help prevent falls. What can I do on the outside of my home? Regularly fix the edges of walkways and driveways and fix any cracks. Remove anything that might make you trip as you walk through a door, such as a raised step or threshold. Trim any bushes or trees on the path to your home. Use bright outdoor lighting. Clear any walking paths of anything that might make someone trip, such as rocks or tools. Regularly check to see if handrails are loose or broken. Make sure that both sides of any steps have handrails. Any raised decks and porches should have guardrails on the edges. Have any leaves, snow, or ice cleared regularly. Use sand or salt on walking paths during winter. Clean up any spills in your garage right away. This includes oil or grease spills. What can I do in the bathroom? Use night lights. Install grab bars by the toilet and in the tub and shower. Do not use towel bars as grab bars. Use non-skid mats or decals in the tub or shower. If you need to sit down in the shower, use a plastic, non-slip stool. Keep the floor dry. Clean up any water that spills on the floor as soon as it happens. Remove soap buildup in the tub or shower regularly. Attach bath mats securely with double-sided non-slip rug tape. Do not have throw rugs and other things on the floor that can make you trip. What can I do in the bedroom? Use night lights. Make  sure that you have a light by your bed that is easy to reach. Do not use any sheets or blankets that are too big for your bed. They should not hang down onto the floor. Have a firm chair that has side arms. You can use this for support while you get dressed. Do not have throw rugs and other things on the floor that can make you trip. What can I do in the kitchen? Clean up any spills right away. Avoid walking on wet floors. Keep items that you use a lot in easy-to-reach places. If you need to reach something above you, use a strong step stool that has a grab bar. Keep electrical cords out of the way. Do not use floor polish or wax that makes floors slippery. If you must use wax, use non-skid floor wax. Do not have throw rugs and other things on the floor that can make you trip. What can I do with my stairs? Do not leave any items on the stairs. Make sure that there are handrails on both sides of the stairs and use them. Fix handrails that are broken or loose. Make sure that handrails are as long as the stairways. Check any carpeting to make sure that it is firmly attached to the stairs. Fix any carpet that is loose or worn. Avoid having throw rugs at the top or bottom of the stairs. If  you do have throw rugs, attach them to the floor with carpet tape. Make sure that you have a light switch at the top of the stairs and the bottom of the stairs. If you do not have them, ask someone to add them for you. What else can I do to help prevent falls? Wear shoes that: Do not have high heels. Have rubber bottoms. Are comfortable and fit you well. Are closed at the toe. Do not wear sandals. If you use a stepladder: Make sure that it is fully opened. Do not climb a closed stepladder. Make sure that both sides of the stepladder are locked into place. Ask someone to hold it for you, if possible. Clearly mark and make sure that you can see: Any grab bars or handrails. First and last steps. Where the  edge of each step is. Use tools that help you move around (mobility aids) if they are needed. These include: Canes. Walkers. Scooters. Crutches. Turn on the lights when you go into a dark area. Replace any light bulbs as soon as they burn out. Set up your furniture [place] you have a clear path. Avoid moving your furniture around. If any of your floors are uneven, fix them. If there are any pets around you, be aware of where they are. Review your medicines with your doctor. Some medicines can make you feel dizzy. This can increase your chance of falling. Ask your doctor what other things that you can do to help prevent falls. This information is not intended to replace advice given to you by your health care provider. Make sure you discuss any questions you have with your health care provider. Document Released: 07/09/2009 Document Revised: 02/18/2016 Document Reviewed: 10/17/2014 Elsevier Interactive Patient Education  2017 [employer].

## 2021-11-12 NOTE — Addendum Note (Signed)
Addended by: Caroleen Hamman A on: 11/12/2021 10:56 AM   Modules accepted: Orders

## 2021-11-17 ENCOUNTER — Telehealth: Payer: Self-pay | Admitting: *Deleted

## 2021-11-17 NOTE — Telephone Encounter (Signed)
° °  Telephone encounter was:  Unsuccessful.  11/17/2021 Name: Barry [name] MRN: [id number] DOB: 1951/02/17  Unsuccessful outbound call made today to assist with:  Food Insecurity  Outreach Attempt:  1st Attempt  A HIPAA compliant voice message was left requesting a return call.  Instructed patient to call back at   Instructed patient to call back at [phone]  at their earliest convenience. .  [place], Care Management  [phone] [address] , [place] [address] Email : [email or web address]

## 2021-11-18 ENCOUNTER — Telehealth: Payer: Self-pay | Admitting: *Deleted

## 2021-11-18 NOTE — Telephone Encounter (Signed)
° °  Telephone encounter was:  Unsuccessful.  11/18/2021 Name: Barry [name] MRN: [id number] DOB: 29-Dec-1950  Unsuccessful outbound call made today to assist with:  Food Insecurity  Outreach Attempt:  2nd Attempt  A HIPAA compliant voice message was left requesting a return call.  Instructed patient to call back at   Instructed patient to call back at [phone]  at their earliest convenience. .  [place], Care Management  [phone] [address] , [place] [address] Email : [email or web address]

## 2021-11-22 ENCOUNTER — Telehealth: Payer: Self-pay | Admitting: *Deleted

## 2021-11-22 NOTE — Telephone Encounter (Signed)
° °  Telephone encounter was:  Unsuccessful.  11/22/2021 Name: Barry [name] MRN: [id number] DOB: [birth year]  Unsuccessful outbound call made today to assist with:  Food Insecurity  Outreach Attempt:  2nd Attempt  A HIPAA compliant voice message was left requesting a return call.  Instructed patient to call back at   Instructed patient to call back at [phone]  at their earliest convenience.  Sherren Mocha interpreter [phone] left patient message at home number as well tried to reach son Momsavath left message as well   [place], Care Management  [phone] [address] , [place] [address] Email : [email or web address] [email or web address]

## 2021-11-23 ENCOUNTER — Telehealth: Payer: Self-pay | Admitting: *Deleted

## 2021-11-23 NOTE — Telephone Encounter (Signed)
° °  Telephone encounter was:  Unsuccessful.  11/23/2021 Name: Barry [name] MRN: [id number] DOB: Sep 10, 1951  Unsuccessful outbound call made today to assist with:  Food Insecurity  Outreach Attempt:  2nd Attempt  Could not leave a message this time   [place], Care Management  [phone] [address] , [place] [address] Email : [email or web address]

## 2021-11-25 ENCOUNTER — Telehealth (HOSPITAL_COMMUNITY): Payer: Self-pay

## 2021-11-25 NOTE — Telephone Encounter (Signed)
Called and spoke with patient son Barry [name] to see if pt was interested in participating in the Cardiac Rehab Program. Patient stated yes. Patient will come in for orientation on 12/21/21 @ 1:15PM and will attend the 8:30AM exercise class. ?  ?Tourist information centre manager.  ?

## 2021-12-02 ENCOUNTER — Telehealth: Payer: Self-pay | Admitting: *Deleted

## 2021-12-02 NOTE — Telephone Encounter (Signed)
? ?  Telephone encounter was:  Unsuccessful.  12/02/2021 ?Name: Keanen Dohse MRN: [id number] DOB: Jun 18, 1951 ? ?Unsuccessful outbound call made today to assist with:  Food Insecurity ? ?Outreach Attempt:  3rd Attempt.  Referral closed unable to contact patient. ? ?A HIPAA compliant voice message was left requesting a return call.  Instructed patient to call back at   Instructed patient to call back at [phone]  at their earliest convenience. . ? ?Payton Moder Greenauer -Selinda Eon ?Care Guide , Embedded Care Coordination ?[place], Care Management  ?[phone] ?[address] , [place] [address] ?Email : [email or web address] ?  ?

## 2021-12-20 ENCOUNTER — Telehealth (HOSPITAL_COMMUNITY): Payer: Self-pay

## 2021-12-20 NOTE — Telephone Encounter (Signed)
Called pt son and left a message because we had to change pt's cardiac rehab appointments, left a detailed message and advised pt son if the new dates didn't work to call back. Pt;s son didn't call back. Called pt's son back a few days later and he stated that he received my message and that the new dates for  his fathers cardiac rehab were fine. ?

## 2021-12-21 ENCOUNTER — Ambulatory Visit (HOSPITAL_COMMUNITY): Payer: Medicare Other

## 2021-12-23 ENCOUNTER — Other Ambulatory Visit: Payer: Self-pay | Admitting: Endocrinology

## 2021-12-24 ENCOUNTER — Telehealth: Payer: Self-pay | Admitting: Family Medicine

## 2021-12-24 ENCOUNTER — Other Ambulatory Visit: Payer: Self-pay

## 2021-12-24 MED ORDER — INSULIN GLARGINE 100 UNIT/ML ~~LOC~~ SOLN
22.0000 [IU] | Freq: Every day | SUBCUTANEOUS | 1 refills | Status: DC
Start: 2021-12-24 — End: 2022-05-10

## 2021-12-24 NOTE — Telephone Encounter (Signed)
Will let son know that pt is past due for 6 month follow up from 05/2021 OV. Refill sent.  ?

## 2021-12-24 NOTE — Telephone Encounter (Signed)
[name] is leaving for town and would like to pikc up dads medication before heading out.  ? ?insulin glargine (LANTUS) 100 UNIT/ML injection [[id number]]  ?[place] ([place]), [place]  ?[place] ([place])  [id number]  ?Phone:  [phone]  Fax:  [phone]  ?

## 2021-12-27 ENCOUNTER — Ambulatory Visit (HOSPITAL_COMMUNITY): Payer: Medicare Other

## 2021-12-29 ENCOUNTER — Ambulatory Visit (HOSPITAL_COMMUNITY): Payer: Medicare Other

## 2021-12-30 ENCOUNTER — Inpatient Hospital Stay (HOSPITAL_COMMUNITY): Admission: RE | Admit: 2021-12-30 | Payer: Medicare Other | Source: Ambulatory Visit

## 2021-12-31 ENCOUNTER — Ambulatory Visit (HOSPITAL_COMMUNITY): Payer: Medicare Other

## 2022-01-03 ENCOUNTER — Ambulatory Visit (HOSPITAL_COMMUNITY): Payer: Medicare Other

## 2022-01-05 ENCOUNTER — Ambulatory Visit (HOSPITAL_COMMUNITY): Payer: Medicare Other

## 2022-01-07 ENCOUNTER — Ambulatory Visit (HOSPITAL_COMMUNITY): Payer: Medicare Other

## 2022-01-10 ENCOUNTER — Ambulatory Visit (HOSPITAL_COMMUNITY): Payer: Medicare Other

## 2022-01-10 ENCOUNTER — Ambulatory Visit (HOSPITAL_BASED_OUTPATIENT_CLINIC_OR_DEPARTMENT_OTHER): Payer: Medicare Other | Admitting: Family

## 2022-01-10 ENCOUNTER — Encounter (HOSPITAL_BASED_OUTPATIENT_CLINIC_OR_DEPARTMENT_OTHER): Payer: Self-pay | Admitting: Family

## 2022-01-10 VITALS — BP 138/74 | HR 61 | Ht 65.0 in | Wt 170.6 lb

## 2022-01-10 DIAGNOSIS — N1831 Chronic kidney disease, stage 3a: Secondary | ICD-10-CM | POA: Diagnosis not present

## 2022-01-10 DIAGNOSIS — I493 Ventricular premature depolarization: Secondary | ICD-10-CM | POA: Diagnosis not present

## 2022-01-10 DIAGNOSIS — I5020 Unspecified systolic (congestive) heart failure: Secondary | ICD-10-CM | POA: Diagnosis not present

## 2022-01-10 DIAGNOSIS — M792 Neuralgia and neuritis, unspecified: Secondary | ICD-10-CM | POA: Diagnosis not present

## 2022-01-10 DIAGNOSIS — I25118 Atherosclerotic heart disease of native coronary artery with other forms of angina pectoris: Secondary | ICD-10-CM | POA: Diagnosis not present

## 2022-01-10 DIAGNOSIS — E785 Hyperlipidemia, unspecified: Secondary | ICD-10-CM | POA: Diagnosis not present

## 2022-01-10 MED ORDER — GABAPENTIN 300 MG PO CAPS: 300.0000 mg | ORAL_CAPSULE | Freq: Two times a day (BID) | ORAL | 2 refills | Status: DC

## 2022-01-10 MED ORDER — ASPIRIN 81 MG PO TBEC: 81.0000 mg | DELAYED_RELEASE_TABLET | Freq: Every day | ORAL | Status: DC

## 2022-01-10 MED ORDER — ROSUVASTATIN CALCIUM 40 MG PO TABS: 40.0000 mg | ORAL_TABLET | Freq: Every day | ORAL | 3 refills | Status: DC

## 2022-01-10 NOTE — Progress Notes (Signed)
? ?Office Visit  ?  ?Patient Name: Barry [name] ?Date of Encounter: 01/10/2022 ? ?PCP:  Darreld Mclean, MD ?  ?[place]  ?Cardiologist:  Sherren Mocha, MD  ?Advanced Practice Provider:  Liliane Shi, PA-C ?Electrophysiologist:  None  ?   ? ?Chief Complaint  ?  ?Barry [name] is a 71 y.o. male with a hx of CAD s/p CABG (LIMA-LAD, reverse SVG-PDA, first diagonal, OM), hyperlipidemia, heart failure with mildly reduced ejection fraction, PVC, chronic superior mesenteric artery dissection, PAD (L CIA with penetrating ulcer), CVA (2011), diabetes, hypertension, CKD, hyperlipidemia, AAA presents today for CAD follow-up ? ?Past Medical History  ?  ?Past Medical History:  ?Diagnosis Date  ? Anginal pain ([place])   ? CAD (coronary artery disease)   ? LHC (02/26/14):  >> PCI:  Xience DES to pRCA // LHC (03/10/14):  >> PCI:  Xience DES to [facility] // Myoview 2/16: low risk // Cath 2016: patent stents; stable dz - med Rx // Myoview 10/22: anterolateral ischemia, inferoapical infarct w peri infarct ischemia; EF 41; High Risk // Cath 12/22: LCx stent occl, severe LAD and Dx dz, diffuse RCA disease >> CABG  ? Carotid stenosis   ? a. Carotid US (02/27/14):  Bilateral ICA 1-39%  ? CKD (chronic kidney disease) stage 3   ? Depression   ? Diabetes mellitus   ? 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[place])   ? a. Mesenteric Artery Duplex (7/15):  pSMA chronic dissection with aneurysmal dilation of 1.29 cm (VVS);  b.  Chest CTA (02/26/14):  IMPRESSION:  1. No aortic  dissection or other acute abnormality.  2. 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  3. Stable 17 mm ectasia of the left common iliac artery.  4. Atherosclerosis, including aortoiliac and coronary artery disease.   ? HFmrEF (heart failure with mildly reduced EF) 09/2015  ? Echo 6/15: EF 50-55 // Echo 1/17: mod LVH, EF 55%, inf-lat HK, Gr 1 DD, mild LAE // Echocardiogram 10/22: EF 40-45, Gr 1 DD, mildly reduced RVSF, RVSP 26.6, trivial MR, trivial AI, post  and ant-lat HK  ? Hyperlipidemia   ? Hypertension   ? Iliac artery aneurysm, left ([place])   ? Myocardial infarction [facility])   ? PVC's (premature ventricular contractions) 07/21/2021  ? Monitor 10/22:  NSR avg HR 71; no AF/Flutter; no high grade HB or pathologic pauses PVC burden 20%, rare supraventricular beats w/o sustained arrhythmia   ? Stroke ([place]) 09/26/2009  ? ?Past Surgical History:  ?Procedure Laterality Date  ? CARDIAC CATHETERIZATION    ? CARDIAC CATHETERIZATION N/A 06/25/2015  ? Procedure: Left Heart Cath and Coronary Angiography;  Surgeon: Burnell Blanks, MD;  Location: [facility];  Service: Cardiovascular;  Laterality: N/A;  ? COLONOSCOPY WITH PROPOFOL N/A 07/23/2015  ? Procedure: COLONOSCOPY WITH PROPOFOL;  Surgeon: [name], MD;  Location: [facility];  Service: Endoscopy;  Laterality: N/A;  ? CORONARY ANGIOPLASTY    ? CORONARY ARTERY BYPASS GRAFT N/A 09/21/2021  ? Procedure: CORONARY ARTERY BYPASS GRAFTING (CABG)  X FOUR, ON PUMP, USING LEFT INTERNAL MAMMARY ARTERY AND RIGHT ENDOSCOPIC GREATER SAPHENOUS VEIN CONDUITS;  Surgeon: Lajuana Matte, MD;  Location: [facility];  Service: Open Heart Surgery;  Laterality: N/A;  ? ENDOVEIN HARVEST OF GREATER SAPHENOUS VEIN  09/21/2021  ? Procedure: ENDOVEIN HARVEST OF GREATER SAPHENOUS VEIN;  Surgeon: Lajuana Matte, MD;  Location: [facility];  Service: Open Heart Surgery;;  ? LEFT HEART CATH AND CORONARY  ANGIOGRAPHY N/A 08/31/2021  ? Procedure: LEFT HEART CATH AND CORONARY ANGIOGRAPHY;  Surgeon: Belva Crome, MD;  Location: [facility];  Service: Cardiovascular;  Laterality: N/A;  ? LEFT HEART CATHETERIZATION WITH CORONARY ANGIOGRAM N/A 02/26/2014  ? Procedure: LEFT HEART CATHETERIZATION WITH CORONARY ANGIOGRAM;  Surgeon: Wellington Hampshire, MD;  Location: [facility];  Service: Cardiovascular;  Laterality: N/A;  ? LEFT HEART CATHETERIZATION WITH CORONARY ANGIOGRAM N/A 03/10/2014  ? Procedure: LEFT HEART CATHETERIZATION WITH CORONARY  ANGIOGRAM;  Surgeon: Jettie Booze, MD;  Location: [facility] CATH LAB;  Service: Cardiovascular;  Laterality: N/A;  ? TEE WITHOUT CARDIOVERSION N/A 09/21/2021  ? Procedure: TRANSESOPHAGEAL ECHOCARDIOGRAM (TEE);  Surgeon: Lajuana Matte, MD;  Location: [facility];  Service: Open Heart Surgery;  Laterality: N/A;  ? ? ?Allergies ? ?Allergies  ?Allergen Reactions  ? Metformin And Related   ?  Pt feels that this causes constipation and will not take   ? ? ?History of Present Illness  ?  ?Barry [name] is a 71 y.o. male with a hx of CAD s/p CABG (LIMA-LAD, reverse SVG-PDA, first diagonal, OM), hyperlipidemia, heart failure with mildly reduced ejection fraction, PVC, chronic superior mesenteric artery dissection, PAD (L CIA with penetrating ulcer), CVA (2011), diabetes, hypertension, CKD, hyperlipidemia, AAA last seen while hospitalized. ? ?Previous work-up of coronary artery disease includes NSTEMI June 2015 with DES to proximal RCA.  Readmitted 02-2024 with unstable angina with DES to the circumflex.  Myoview 10/2014 low risk study with no ischemia.  Cardiac cath 05/2015 with LCx and RCA stents okay, OM2 subbranch jailed, L PDA moderate stenosis recommended for medical management.  Myoview 06/2021 with anterolateral ischemia, EF 41%, high risk study.  Echocardiogram 06/2021 LVEF 40 to 45%.  [place] rwiht high PVC burden 20%. Underwent cardiac catheterization 08/31/2021 showing total occlusion due to ISR in the circumflex, severe disease in proximal to mid LAD and first diagonal with moderate disease in the right coronary artery disease.  He was recommended for CABG. ? ?He was admitted 09/21/2021 for CABG X4.  Postop day 2 he developed significant issues with ileus and nausea and vomiting.  NG tube attempted without success.  Over time the ileus resolved.  He had AKI with peak creatinine of 2.05 but was 1.5 on discharge.  ? ?Seen in follow-up 10/28/2021 doing overall well and walking at home up to 10 minutes.  He had mild chest  discomfort which was incisional and not of concern.  Direct LDL revealed LDL 53 at goal less than 70.  His lisinopril continue to be held due to relative hypotension. ? ?He presents today for follow-up with family member.  Video interpreter utilized for duration of visit.  Continues to struggle with left sided flank pain consistent with postsurgical neuropathy with description of hypersensitive skin, tingling.  Otherwise feeling well with no exertional dyspnea, chest pain, edema.  He does have blood pressure cuff at home but has not been checking. ? ?EKGs/Labs/Other Studies Reviewed:  ? ?The following studies were reviewed today: ? ?Echo 07/14/21 ?1. Frequent PVCs during exam.  ? 2. Left ventricular ejection fraction, by estimation, is 40 to 45% with  ?beat to beat variability. The left ventricle has mildly decreased  ?function. The left ventricle demonstrates regional wall motion  ?abnormalities (see scoring diagram/findings for  ?description). There is mild left ventricular hypertrophy. Left ventricular  ?diastolic parameters are consistent with Grade I diastolic dysfunction  ?(impaired relaxation).  ? 3. Right ventricular systolic function is mildly reduced. The  right  ?ventricular size is normal. There is normal pulmonary artery systolic  ?pressure. The estimated right ventricular systolic pressure is 03.5 mmHg.  ? 4. The mitral valve is grossly normal. Trivial mitral valve  ?regurgitation. No evidence of mitral stenosis.  ? 5. The aortic valve is grossly normal. There is mild calcification of the  ?aortic valve. Aortic valve regurgitation is trivial. No aortic stenosis is  ?present.  ? 6. The inferior vena cava is normal in size with greater than 50%  ?respiratory variability, suggesting right atrial pressure of 3 mmHg.  ? ?Myoview 07/15/21 ?  Findings are consistent with ischemia. The study is high risk. ?  No ST deviation was noted. ?  LV perfusion is abnormal. There is evidence of ischemia. There is a medium  defect with moderate reduction in uptake present in the apical to mid anterolateral and apical is reversible consistent with ischemia. There is a inerior apical perfusion defect in rest that worsened with st

## 2022-01-10 NOTE — Patient Instructions (Addendum)
Medication Instructions:  ?Your physician has recommended you make the following change in your medication: ? ?Change: Gabapentin '300mg'$  twice daily  ? ?Continue: Aspirin '81mg'$  daily  ? ?Stop: Lisinopril  ? ?*If you need a refill on your cardiac medications before your next appointment, please call your pharmacy* ? ? ?Lab Work: ?None ordered today  ? ?Testing/Procedures: ?None ordered today  ? ? ?Follow-Up: ?At [facility], you and your health needs are our priority.  As part of our continuing mission to provide you with exceptional heart care, we have created designated Provider Care Teams.  These Care Teams include your primary Cardiologist (physician) and Advanced Practice Providers (APPs -  Physician Assistants and Nurse Practitioners) who all work together to provide you with the care you need, when you need it. ? ?We recommend signing up for the patient portal called "MyChart".  Sign up information is provided on this After Visit Summary.  MyChart is used to connect with patients for Virtual Visits (Telemedicine).  Patients are able to view lab/test results, encounter notes, upcoming appointments, etc.  Non-urgent messages can be sent to your provider as well.   ?To learn more about what you can do with MyChart, go to [email or web address].   ? ?Your next appointment:   ?Follow up as scheduled with Dr. Burt Knack  ? ?Other Instructions ?Please check your Blood pressure two times per week and keep a log and bring to your next appointment.  ? ?Exercise recommendations: ?The American Heart Association recommends 150 minutes of moderate intensity exercise weekly. ?Try 30 minutes of moderate intensity exercise 4-5 times per week. ?This could include walking, jogging, or swimming. ? ?Heart Healthy Diet Recommendations: ?A low-salt diet is recommended. Meats should be grilled, baked, or boiled. Avoid fried foods. Focus on lean protein sources like fish or chicken with vegetables and fruits. The American Heart  Association is a Microbiologist!  American Heart Association Diet and Lifeystyle Recommendations  ? ? ?Important Information About Sugar ? ? ? ? ? ? ?

## 2022-01-12 ENCOUNTER — Ambulatory Visit (HOSPITAL_COMMUNITY): Payer: Medicare Other

## 2022-01-13 ENCOUNTER — Telehealth (HOSPITAL_COMMUNITY): Payer: Self-pay | Admitting: *Deleted

## 2022-01-13 ENCOUNTER — Telehealth (HOSPITAL_COMMUNITY): Payer: Self-pay

## 2022-01-13 ENCOUNTER — Inpatient Hospital Stay (HOSPITAL_COMMUNITY): Admission: RE | Admit: 2022-01-13 | Payer: Medicare Other | Source: Ambulatory Visit

## 2022-01-13 NOTE — Telephone Encounter (Signed)
Attempted to contact patient via interpreter for today's appointment for orientation.Barnet Pall, RN,BSN ?01/13/2022 7:55 AM  ?

## 2022-01-14 ENCOUNTER — Ambulatory Visit (HOSPITAL_COMMUNITY): Payer: Medicare Other

## 2022-01-17 ENCOUNTER — Ambulatory Visit (HOSPITAL_COMMUNITY): Payer: Medicare Other

## 2022-01-19 ENCOUNTER — Ambulatory Visit (HOSPITAL_COMMUNITY): Payer: Medicare Other

## 2022-01-21 ENCOUNTER — Ambulatory Visit (HOSPITAL_COMMUNITY): Payer: Medicare Other

## 2022-01-24 ENCOUNTER — Ambulatory Visit (HOSPITAL_COMMUNITY): Payer: Medicare Other

## 2022-01-26 ENCOUNTER — Ambulatory Visit (HOSPITAL_COMMUNITY): Payer: Medicare Other

## 2022-01-28 ENCOUNTER — Ambulatory Visit (HOSPITAL_COMMUNITY): Payer: Medicare Other

## 2022-01-31 ENCOUNTER — Ambulatory Visit (HOSPITAL_COMMUNITY): Payer: Medicare Other

## 2022-02-02 ENCOUNTER — Ambulatory Visit (HOSPITAL_COMMUNITY): Payer: Medicare Other

## 2022-02-04 ENCOUNTER — Ambulatory Visit (HOSPITAL_COMMUNITY): Payer: Medicare Other

## 2022-02-07 ENCOUNTER — Ambulatory Visit (HOSPITAL_COMMUNITY): Payer: Medicare Other

## 2022-02-09 ENCOUNTER — Ambulatory Visit (HOSPITAL_COMMUNITY): Payer: Medicare Other

## 2022-02-11 ENCOUNTER — Ambulatory Visit (HOSPITAL_COMMUNITY): Payer: Medicare Other

## 2022-02-14 ENCOUNTER — Ambulatory Visit (HOSPITAL_COMMUNITY): Payer: Medicare Other

## 2022-02-16 ENCOUNTER — Ambulatory Visit (HOSPITAL_COMMUNITY): Payer: Medicare Other

## 2022-02-17 ENCOUNTER — Ambulatory Visit: Payer: Medicare Other | Admitting: Cardiovascular Disease

## 2022-02-18 ENCOUNTER — Ambulatory Visit (HOSPITAL_COMMUNITY): Payer: Medicare Other

## 2022-02-23 ENCOUNTER — Ambulatory Visit (HOSPITAL_COMMUNITY): Payer: Medicare Other

## 2022-02-25 ENCOUNTER — Ambulatory Visit (HOSPITAL_COMMUNITY): Payer: Medicare Other

## 2022-02-26 ENCOUNTER — Other Ambulatory Visit: Payer: Self-pay | Admitting: Family Medicine

## 2022-02-26 DIAGNOSIS — E118 Type 2 diabetes mellitus with unspecified complications: Secondary | ICD-10-CM

## 2022-02-28 ENCOUNTER — Ambulatory Visit (HOSPITAL_COMMUNITY): Payer: Medicare Other

## 2022-03-02 ENCOUNTER — Ambulatory Visit (HOSPITAL_COMMUNITY): Payer: Medicare Other

## 2022-03-04 ENCOUNTER — Ambulatory Visit (HOSPITAL_COMMUNITY): Payer: Medicare Other

## 2022-03-07 ENCOUNTER — Ambulatory Visit (HOSPITAL_COMMUNITY): Payer: Medicare Other

## 2022-03-09 ENCOUNTER — Ambulatory Visit (HOSPITAL_COMMUNITY): Payer: Medicare Other

## 2022-03-19 ENCOUNTER — Other Ambulatory Visit: Payer: Self-pay | Admitting: Family Medicine

## 2022-03-19 DIAGNOSIS — M25512 Pain in left shoulder: Secondary | ICD-10-CM

## 2022-03-21 NOTE — Progress Notes (Deleted)
[place] [facility] [address], [place], [place] [address] [phone]  Date:  03/24/2022   Name:  Barry [name]   DOB:  Jun 14, 1951   MRN:  [id number]  PCP:  Darreld Mclean, MD    Chief Complaint: No chief complaint on file.   History of Present Illness:  Barry [name] is a 71 y.o. very pleasant male patient who presents with the following:  Patient seen today with concern of tingling in his hands, fatigue, diabetes follow-up Most recent visit with myself was in September  History of CAD status post NSTEMI and stenting x2, chronic superior mesenteric artery dissection followed by vascular surgery, PAD, stroke in 2011, diabetes, hypertension, hyperlipidemia, CKD-his care is also complicated by language barrier and some medical noncompliance/ confusion about medication  Patient originally from [place], married with 2 adult sons  Seen by cardiology in April: CAD - Stable with no anginal symptoms. Surgical incisions healing appropriately.  GDMT includes Aspirin 81 mg daily, Metoprolol Tartrate 25 mg twice daily, Rosuvastatin 40 mg daily. Heart healthy diet and regular cardiovascular exercise encouraged.  Encouraged to participate in cardiac rehab.  Given postoperative neuropathy will increase Gabapentin to 359m BID (previously on QHS) with further follow up with PCP.  PVC - Monitor 07/21/21 with PVC burden 20%. Not taking Metoprolol at the time.  No palpitations. Continue Metoprolol Tartrate 249mBID.  HFmrEF - Euvolemic and well compensated on exam. Pre-CABG TEE EF 40-45%. Post-CABG TEE with EF 45-50%. GDMT limited by hypotension. Present GDMT includes Metoprolol.  BP mildly elevated in clinic today though anticipate this is due to pain.  We will ask him to check at home and contact our office if blood pressure consistently greater than 130/80.  If noted lisinopril or losartan could be started at low-dose. HLD, LDL goal <70 - .  09/2021  LDL 53.  Continue rosuvastatin  CKD - Careful titration of diuretic and antihypertensive.    Eye exam Urine microalbumin can be updated Update labs  Lab Results  Component Value Date   HGBA1C 8.9 (H) 09/17/2021     Patient Active Problem List   Diagnosis Date Noted   S/P CABG x 4 09/21/2021   Abnormal nuclear stress test 08/31/2021   PVC's (premature ventricular contractions) 07/21/2021   CKD (chronic kidney disease) stage 3, GFR 30-59 ml/min (HCMillstadt10/26/2022   HFmrEF (heart failure with mildly reduced EF) 09/2015   Atherosclerotic peripheral vascular disease with ulceration (HCSouth [place]man[place]07/04/2015   Aneurysm of iliac artery (HCGreensville06/23/2015   Abdominal pain, unspecified site 03/18/2014   Syncope in setting of nausea and vomiting 03/07/14 03/08/2014   Coronary artery disease 02/28/2014   Non compliance with medical treatment 02/28/2014   Dyslipidemia, goal LDL below 70 02/28/2014   Bradycardia- beta blocker decreased 02/28/2014   Chronic diastolic CHF (congestive heart failure) (HCLewistown06/01/2014   Type 2 diabetes mellitus with manifestations (HCNeillsville06/12/2013   TIA (transient ischemic attack) 02/26/2014   History of non-ST elevation myocardial infarction (NSTEMI) 02/26/2014   Weakness-Left arm / Lef 12/06/2013   Mesenteric artery stenosis (HCLa Crosse03/13/2015   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HCBangor03/06/2013   Hypertension 11/10/2011   History of CVA in adulthood 11/10/2011   Language barrier 11/10/2011    Past Medical History:  Diagnosis Date   Anginal pain (HCMarlboro Village   CAD (coronary artery disease)    LHC (02/26/14):  >> PCI:  Xience DES to pRCA // LHC (03/10/14):  >>  PCI:  Xience DES to [facility] // Myoview 2/16: low risk // Cath 2016: patent stents; stable dz - med Rx // Myoview 10/22: anterolateral ischemia, inferoapical infarct w peri infarct ischemia; EF 41; High Risk // Cath 12/22: LCx stent occl, severe LAD and Dx dz, diffuse RCA disease >> CABG   Carotid stenosis    a. Carotid US  (02/27/14):  Bilateral ICA 1-39%   CKD (chronic kidney disease) stage 3    Depression    Diabetes mellitus    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[place])    a. Mesenteric Artery Duplex (7/15):  pSMA chronic dissection with aneurysmal dilation of 1.29 cm (VVS);  b.  Chest CTA (02/26/14):  IMPRESSION:  1. No aortic  dissection or other acute abnormality.  2. 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  3. Stable 17 mm ectasia of the left common iliac artery.  4. Atherosclerosis, including aortoiliac and coronary artery disease.    HFmrEF (heart failure with mildly reduced EF) 09/2015   Echo 6/15: EF 50-55 // Echo 1/17: mod LVH, EF 55%, inf-lat HK, Gr 1 DD, mild LAE // Echocardiogram 10/22: EF 40-45, Gr 1 DD, mildly reduced RVSF, RVSP 26.6, trivial MR, trivial AI, post and ant-lat HK   Hyperlipidemia    Hypertension    Iliac artery aneurysm, left (HCC)    Myocardial infarction (HCC)    PVC's (premature ventricular contractions) 07/21/2021   Monitor 10/22:  NSR avg HR 71; no AF/Flutter; no high grade HB or pathologic pauses PVC burden 20%, rare supraventricular beats w/o sustained arrhythmia    Stroke ([place]) 09/26/2009    Past Surgical History:  Procedure Laterality Date   CARDIAC CATHETERIZATION     CARDIAC CATHETERIZATION N/A 06/25/2015   Procedure: Left Heart Cath and Coronary Angiography;  Surgeon: Burnell Blanks, MD;  Location: [place] CV LAB;  Service: Cardiovascular;  Laterality: N/A;   COLONOSCOPY WITH PROPOFOL N/A 07/23/2015   Procedure: COLONOSCOPY WITH PROPOFOL;  Surgeon: [name], MD;  [facility];  Service: Endoscopy;  Laterality: N/A;   CORONARY ANGIOPLASTY     CORONARY ARTERY BYPASS GRAFT N/A 09/21/2021   Procedure: CORONARY ARTERY BYPASS GRAFTING (CABG)  X FOUR, ON PUMP, USING LEFT INTERNAL MAMMARY ARTERY AND RIGHT ENDOSCOPIC GREATER SAPHENOUS VEIN CONDUITS;  Surgeon: Lajuana Matte, MD;  Location: [place];  Service: Open Heart Surgery;   Laterality: N/A;   ENDOVEIN HARVEST OF GREATER SAPHENOUS VEIN  09/21/2021   Procedure: ENDOVEIN HARVEST OF GREATER SAPHENOUS VEIN;  Surgeon: Lajuana Matte, MD;  Location: [place];  Service: Open Heart Surgery;;   LEFT HEART CATH AND CORONARY ANGIOGRAPHY N/A 08/31/2021   Procedure: LEFT HEART CATH AND CORONARY ANGIOGRAPHY;  Surgeon: Belva Crome, MD;  Location: [place] CV LAB;  Service: Cardiovascular;  Laterality: N/A;   LEFT HEART CATHETERIZATION WITH CORONARY ANGIOGRAM N/A 02/26/2014   Procedure: LEFT HEART CATHETERIZATION WITH CORONARY ANGIOGRAM;  Surgeon: Wellington Hampshire, MD;  Location: [place] CATH LAB;  Service: Cardiovascular;  Laterality: N/A;   LEFT HEART CATHETERIZATION WITH CORONARY ANGIOGRAM N/A 03/10/2014   Procedure: LEFT HEART CATHETERIZATION WITH CORONARY ANGIOGRAM;  Surgeon: Jettie Booze, MD;  [facility] C[facility];  Service: Cardiovascular;  Laterality: N/A;   TEE WITHOUT CARDIOVERSION N/A 09/21/2021   Procedure: TRANSESOPHAGEAL ECHOCARDIOGRAM (TEE);  Surgeon: Lajuana Matte, MD;  Location: [place];  Service: Open Heart Surgery;  Laterality: N/A;    Social History   Tobacco Use   Smoking status: Former  Packs/day: 0.25    Years: 38.00    Total pack years: 9.50    Types: Cigarettes    Quit date: 2008    Years since quitting: 15.4   Smokeless tobacco: Never  Vaping Use   Vaping Use: Never used  Substance Use Topics   Alcohol use: No    Alcohol/week: 0.0 standard drinks of alcohol   Drug use: No    Family History  Problem Relation Age of Onset   Diabetes Father    Hypertension Father    Heart attack Father    Stroke Neg Hx     Allergies  Allergen Reactions   Metformin And Related     Pt feels that this causes constipation and will not take     Medication list has been reviewed and updated.  Current Outpatient Medications on File Prior to Visit  Medication Sig Dispense Refill   acetaminophen (TYLENOL) 325 MG tablet Take 2 tablets (650 mg  total) by mouth every 6 (six) hours as needed for mild pain, fever or headache.     aspirin 81 MG EC tablet Take 1 tablet (81 mg total) by mouth daily.     blood glucose meter kit and supplies Dispense based on patient and insurance preference. Use up to four times daily as directed. (FOR ICD-10 E10.9, E11.9). 1 each 0   gabapentin (NEURONTIN) 300 MG capsule Take 1 capsule (300 mg total) by mouth 2 (two) times daily. Further refills per primary care provider. 60 capsule 2   insulin glargine (LANTUS) 100 UNIT/ML injection Inject 0.22-0.27 mLs (22-27 Units total) into the skin daily. 22-"normal blood sugar reading" 27-"elevated blood sugar readings" 10 mL 1   insulin glargine-yfgn (SEMGLEE) 100 UNIT/ML injection INJECT 22 UNITS INTO THE SKIN EVERY MORNING AND 1 PEN NEEDLE 30 mL 0   Insulin Pen Needle (TECHLITE PEN NEEDLES) 32G X 8 MM MISC Use daily to administer insulin 100 each 3   Lancets (ONETOUCH DELICA PLUS [id number]) MISC USE  1 TO CHECK GLUCOSE ONCE DAILY 100 each 0   metoprolol tartrate (LOPRESSOR) 25 MG tablet Take 1 tablet (25 mg total) by mouth 2 (two) times daily. 180 tablet 3   ONETOUCH ULTRA test strip USE 1 STRIP TO CHECK GLUCOSE TWICE DAILY 100 each 0   rosuvastatin (CRESTOR) 40 MG tablet Take 1 tablet (40 mg total) by mouth daily. 90 tablet 3   No current facility-administered medications on file prior to visit.    Review of Systems:  As per HPI- otherwise negative.   Physical Examination: There were no vitals filed for this visit. There were no vitals filed for this visit. There is no height or weight on file to calculate BMI. Ideal Body Weight:    GEN: no acute distress. HEENT: Atraumatic, Normocephalic.  Ears and Nose: No external deformity. CV: RRR, No M/G/R. No JVD. No thrill. No extra heart sounds. PULM: CTA B, no wheezes, crackles, rhonchi. No retractions. No resp. distress. No accessory muscle use. ABD: S, NT, ND, +BS. No rebound. No HSM. EXTR: No c/c/e PSYCH:  Normally interactive. Conversant.    Assessment and Plan: ***  Signed Lamar Blinks, MD

## 2022-03-24 ENCOUNTER — Ambulatory Visit: Payer: Medicare Other | Admitting: Family Medicine

## 2022-03-30 NOTE — Progress Notes (Addendum)
[place] [facility] [place], [place], [place] [address] [phone]  Date:  04/06/2022   Name:  Barry [name]   DOB:  1950/09/27   MRN:  [id number]  PCP:  Darreld Mclean, MD    Chief Complaint: Tingling (Pt c/o Tingling of both hands and fatigue x 1 week and also asks for a1c. )   History of Present Illness:  Barry [name] is a 71 y.o. very pleasant male patient who presents with the following:  Patient seen today for follow-up Most recent visit with myself in September Accompanied today by his wife and [place] interpreter  History of CAD status post NSTEMI and stenting x2, chronic superior mesenteric artery dissection followed by vascular surgery, PAD, stroke in 2011, diabetes, hypertension, hyperlipidemia, CKD-his care is also complicated by language barrier and some medical noncompliance/ confusion about medication More recently he had CABG in December of this year   He was seen most recently by cardiology in April: CAD - Stable with no anginal symptoms. Surgical incisions healing appropriately.  GDMT includes Aspirin 81 mg daily, Metoprolol Tartrate 25 mg twice daily, Rosuvastatin 40 mg daily. Heart healthy diet and regular cardiovascular exercise encouraged.  Encouraged to participate in cardiac rehab.  Given postoperative neuropathy will increase Gabapentin to 395m BID (previously on QHS) with further follow up with PCP.  PVC - Monitor 07/21/21 with PVC burden 20%. Not taking Metoprolol at the time.  No palpitations. Continue Metoprolol Tartrate 264mBID.  HFmrEF - Euvolemic and well compensated on exam. Pre-CABG TEE EF 40-45%. Post-CABG TEE with EF 45-50%. GDMT limited by hypotension. Present GDMT includes Metoprolol.  BP mildly elevated in clinic today though anticipate this is due to pain.  We will ask him to check at home and contact our office if blood pressure consistently greater than 130/80.  If noted lisinopril or  losartan could be started at low-dose. HLD, LDL goal <70 - .  09/2021 LDL 53.  Continue rosuvastatin  CKD - Careful titration of diuretic and antihypertensive.     Eye exam-not done, encouraged them to catch up on this Shingrix Urine micro Update A1c- will do today  He has noted tingling in both hands and both feet for about 8 months- since he had his CABG? Will come and go.    He has pain in his left shoulder also for several months with movement -he notes his range of motion is limited in his left shoulder.  He is not aware of any particular injury.  This also seems to have gotten worse since he had CABG  They bring in some of his medications today, unfortunately what exactly he is taking remains difficult to ascertain.  They do state he is not taking Crestor, I encouraged them to start back on this medication They did not bring his insulin  He does seem to be taking gabapentin 300 twice a day as prescribed by cardiology nurse practitioner  Lab Results  Component Value Date   HGBA1C 8.9 (H) 09/17/2021    Patient Active Problem List   Diagnosis Date Noted   S/P CABG x 4 09/21/2021   Abnormal nuclear stress test 08/31/2021   PVC's (premature ventricular contractions) 07/21/2021   CKD (chronic kidney disease) stage 3, GFR 30-59 ml/min (HCChehalis10/26/2022   HFmrEF (heart failure with mildly reduced EF) 09/2015   Atherosclerotic peripheral vascular disease with ulceration (HCValrico07/04/2015   Aneurysm of iliac artery (HCExcursion Inlet06/23/2015  Abdominal pain, unspecified site 03/18/2014   Syncope in setting of nausea and vomiting 03/07/14 03/08/2014   Coronary artery disease 02/28/2014   Non compliance with medical treatment 02/28/2014   Dyslipidemia, goal LDL below 70 02/28/2014   Bradycardia- beta blocker decreased 02/28/2014   Chronic diastolic CHF (congestive heart failure) ([place]) 02/28/2014   Type 2 diabetes mellitus with manifestations ([place]) 02/27/2014   TIA (transient ischemic attack)  02/26/2014   History of non-ST elevation myocardial infarction (NSTEMI) 02/26/2014   Weakness-Left arm / Lef 12/06/2013   Mesenteric artery stenosis (HCC) 12/06/2013   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[place]) 12/03/2012   Hypertension 11/10/2011   History of CVA in adulthood 11/10/2011   Language barrier 11/10/2011    Past Medical History:  Diagnosis Date   Anginal pain ([place])    CAD (coronary artery disease)    LHC (02/26/14):  >> PCI:  Xience DES to pRCA // LHC (03/10/14):  >> PCI:  Xience DES to [facility] // Myoview 2/16: low risk // Cath 2016: patent stents; stable dz - med Rx // Myoview 10/22: anterolateral ischemia, inferoapical infarct w peri infarct ischemia; EF 41; High Risk // Cath 12/22: LCx stent occl, severe LAD and Dx dz, diffuse RCA disease >> CABG   Carotid stenosis    a. Carotid US (02/27/14):  Bilateral ICA 1-39%   CKD (chronic kidney disease) stage 3    Depression    Diabetes mellitus    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[place])    a. Mesenteric Artery Duplex (7/15):  pSMA chronic dissection with aneurysmal dilation of 1.29 cm (VVS);  b.  Chest CTA (02/26/14):  IMPRESSION:  1. No aortic  dissection or other acute abnormality.  2. 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  3. Stable 17 mm ectasia of the left common iliac artery.  4. Atherosclerosis, including aortoiliac and coronary artery disease.    HFmrEF (heart failure with mildly reduced EF) 09/2015   Echo 6/15: EF 50-55 // Echo 1/17: mod LVH, EF 55%, inf-lat HK, Gr 1 DD, mild LAE // Echocardiogram 10/22: EF 40-45, Gr 1 DD, mildly reduced RVSF, RVSP 26.6, trivial MR, trivial AI, post and ant-lat HK   Hyperlipidemia    Hypertension    Iliac artery aneurysm, left (HCC)    Myocardial infarction (HCC)    PVC's (premature ventricular contractions) 07/21/2021   Monitor 10/22:  NSR avg HR 71; no AF/Flutter; no high grade HB or pathologic pauses PVC burden 20%, rare supraventricular beats w/o sustained arrhythmia    Stroke ([place])  09/26/2009    Past Surgical History:  Procedure Laterality Date   CARDIAC CATHETERIZATION     CARDIAC CATHETERIZATION N/A 06/25/2015   Procedure: Left Heart Cath and Coronary Angiography;  Surgeon: Burnell Blanks, MD;  Location: [place] CV LAB;  Service: Cardiovascular;  Laterality: N/A;   COLONOSCOPY WITH PROPOFOL N/A 07/23/2015   Procedure: COLONOSCOPY WITH PROPOFOL;  Surgeon: [name], MD;  [facility];  Service: Endoscopy;  Laterality: N/A;   CORONARY ANGIOPLASTY     CORONARY ARTERY BYPASS GRAFT N/A 09/21/2021   Procedure: CORONARY ARTERY BYPASS GRAFTING (CABG)  X FOUR, ON PUMP, USING LEFT INTERNAL MAMMARY ARTERY AND RIGHT ENDOSCOPIC GREATER SAPHENOUS VEIN CONDUITS;  Surgeon: Lajuana Matte, MD;  Location: [place];  Service: Open Heart Surgery;  Laterality: N/A;   ENDOVEIN HARVEST OF GREATER SAPHENOUS VEIN  09/21/2021   Procedure: ENDOVEIN HARVEST OF GREATER SAPHENOUS VEIN;  Surgeon: Lajuana Matte, MD;  Location: [place];  Service: Open Heart Surgery;;   LEFT HEART CATH AND CORONARY ANGIOGRAPHY N/A 08/31/2021   Procedure: LEFT HEART CATH AND CORONARY ANGIOGRAPHY;  Surgeon: Belva Crome, MD;  Location: [place] CV LAB;  Service: Cardiovascular;  Laterality: N/A;   LEFT HEART CATHETERIZATION WITH CORONARY ANGIOGRAM N/A 02/26/2014   Procedure: LEFT HEART CATHETERIZATION WITH CORONARY ANGIOGRAM;  Surgeon: Wellington Hampshire, MD;  Location: [place] CATH LAB;  Service: Cardiovascular;  Laterality: N/A;   LEFT HEART CATHETERIZATION WITH CORONARY ANGIOGRAM N/A 03/10/2014   Procedure: LEFT HEART CATHETERIZATION WITH CORONARY ANGIOGRAM;  Surgeon: Jettie Booze, MD;  Location: [name] C[facility] LAB;  Service: Cardiovascular;  Laterality: N/A;   TEE WITHOUT CARDIOVERSION N/A 09/21/2021   Procedure: TRANSESOPHAGEAL ECHOCARDIOGRAM (TEE);  Surgeon: Lajuana Matte, MD;  Location: [place];  Service: Open Heart Surgery;  Laterality: N/A;    Social History   Tobacco Use    Smoking status: Former    Packs/day: 0.25    Years: 38.00    Total pack years: 9.50    Types: Cigarettes    Quit date: 2008    Years since quitting: 15.5   Smokeless tobacco: Never  Vaping Use   Vaping Use: Never used  Substance Use Topics   Alcohol use: No    Alcohol/week: 0.0 standard drinks of alcohol   Drug use: No    Family History  Problem Relation Age of Onset   Diabetes Father    Hypertension Father    Heart attack Father    Stroke Neg Hx     Allergies  Allergen Reactions   Metformin And Related     Pt feels that this causes constipation and will not take     Medication list has been reviewed and updated.  Current Outpatient Medications on File Prior to Visit  Medication Sig Dispense Refill   acetaminophen (TYLENOL) 325 MG tablet Take 2 tablets (650 mg total) by mouth every 6 (six) hours as needed for mild pain, fever or headache.     aspirin 81 MG EC tablet Take 1 tablet (81 mg total) by mouth daily.     blood glucose meter kit and supplies Dispense based on patient and insurance preference. Use up to four times daily as directed. (FOR ICD-10 E10.9, E11.9). 1 each 0   gabapentin (NEURONTIN) 300 MG capsule Take 1 capsule (300 mg total) by mouth 2 (two) times daily. Further refills per primary care provider. 60 capsule 2   insulin glargine (LANTUS) 100 UNIT/ML injection Inject 0.22-0.27 mLs (22-27 Units total) into the skin daily. 22-"normal blood sugar reading" 27-"elevated blood sugar readings" 10 mL 1   insulin glargine-yfgn (SEMGLEE) 100 UNIT/ML injection INJECT 22 UNITS INTO THE SKIN EVERY MORNING AND 1 PEN NEEDLE 30 mL 0   Insulin Pen Needle (TECHLITE PEN NEEDLES) 32G X 8 MM MISC Use daily to administer insulin 100 each 3   Lancets (ONETOUCH DELICA PLUS [id number]) MISC USE  1 TO CHECK GLUCOSE ONCE DAILY 100 each 0   metoprolol tartrate (LOPRESSOR) 25 MG tablet Take 1 tablet (25 mg total) by mouth 2 (two) times daily. 180 tablet 3   ONETOUCH ULTRA test strip  USE 1 STRIP TO CHECK GLUCOSE TWICE DAILY 100 each 0   rosuvastatin (CRESTOR) 40 MG tablet Take 1 tablet (40 mg total) by mouth daily. 90 tablet 3   No current facility-administered medications on file prior to visit.    Review of Systems:  As per HPI- otherwise negative.   Physical Examination: Vitals:  04/06/22 1101  BP: 130/80  Pulse: (!) 57  Resp: 18  Temp: 98.2 F (36.8 C)  SpO2: 97%   Vitals:   04/06/22 1101  Weight: 167 lb 12.8 oz (76.1 kg)  Height: 5' 5" (1.651 m)   Body mass index is 27.92 kg/m. Ideal Body Weight: Weight in (lb) to have BMI = 25: 149.9  GEN: no acute distress.  Mild overweight, looks well HEENT: Atraumatic, Normocephalic.  Ears and Nose: No external deformity. CV: RRR, No M/G/R. No JVD. No thrill. No extra heart sounds. PULM: CTA B, no wheezes, crackles, rhonchi. No retractions. No resp. distress. No accessory muscle use. ABD: S, NT, ND EXTR: No c/c/e PSYCH: Normally interactive. Conversant.  Normal grip strength of both hands, normal pulses in feet Strength of all extremities is normal except left upper extremity is affected by the shoulder pain Flexion and abduction of left shoulder limited by pain, he has even more pain with internal and external rotation Well-healed CABG incisions right leg and anterior chest  Assessment and Plan: Dyslipidemia, goal LDL below 70  Type 2 diabetes mellitus with complication, without long-term current use of insulin ([place]) - Plan: Hemoglobin A1c, Comprehensive metabolic panel  Essential hypertension - Plan: CBC, Comprehensive metabolic panel  Language barrier  Chronic left shoulder pain - Plan: Ambulatory referral to Orthopedic Surgery  Numbness and tingling of both feet - Plan: Ferritin, Vitamin B12, Folate, gabapentin (NEURONTIN) 300 MG capsule, TSH  Numbness and tingling in both hands - Plan: Ferritin, Vitamin B12, Folate, gabapentin (NEURONTIN) 300 MG capsule, TSH  Patient seen today for  follow-up.  He is apparently not taking Crestor currently, I encouraged him to start back on this essential medication.  Explained this is very important to prevent recurrent problems after CABG  Likely neuropathy in hands and feet, secondary to diabetes.  We will also obtain lab work as above.  Continue gabapentin at current dosage  Referral to orthopedics to look at his left shoulder.  Probably not an ideal surgical candidate due to his other heart problems, but an injection may be helpful  Blood pressures well controlled Will plan further follow- up pending labs. Plan recheck 6 months Signed Lamar Blinks, MD  Addnd 7/14- received labs and called pt's son, LMOM Called again 7/17- still no answer.  Will mail labs   Results for orders placed or performed in visit on 04/06/22  Hemoglobin A1c  Result Value Ref Range   Hgb A1c MFr Bld 9.5 (H) 4.6 - 6.5 %  CBC  Result Value Ref Range   WBC 6.4 4.0 - 10.5 K/uL   RBC 5.41 4.22 - 5.81 Mil/uL   Platelets 134.0 (L) 150.0 - 400.0 K/uL   Hemoglobin 14.5 13.0 - 17.0 g/dL   HCT 45.1 39.0 - 52.0 %   MCV 83.4 78.0 - 100.0 fl   MCHC 32.1 30.0 - 36.0 g/dL   RDW 16.4 (H) 11.5 - 15.5 %  Ferritin  Result Value Ref Range   Ferritin 14.5 (L) 22.0 - 322.0 ng/mL  Vitamin B12  Result Value Ref Range   Vitamin B-12 224 211 - 911 pg/mL  Folate  Result Value Ref Range   Folate 11.7 >5.9 ng/mL  Comprehensive metabolic panel  Result Value Ref Range   Sodium 137 135 - 145 mEq/L   Potassium 4.5 3.5 - 5.1 mEq/L   Chloride 101 96 - 112 mEq/L   CO2 30 19 - 32 mEq/L   Glucose, Bld 379 (H) 70 - 99 mg/dL  BUN 22 6 - 23 mg/dL   Creatinine, Ser 1.76 (H) 0.40 - 1.50 mg/dL   Total Bilirubin 0.5 0.2 - 1.2 mg/dL   Alkaline Phosphatase 85 39 - 117 U/L   AST 16 0 - 37 U/L   ALT 10 0 - 53 U/L   Total Protein 6.8 6.0 - 8.3 g/dL   Albumin 4.2 3.5 - 5.2 g/dL   GFR 38.24 (L) >60.00 mL/min   Calcium 9.5 8.4 - 10.5 mg/dL  TSH  Result Value Ref Range   TSH  1.27 0.35 - 5.50 uIU/mL

## 2022-04-06 ENCOUNTER — Ambulatory Visit (INDEPENDENT_AMBULATORY_CARE_PROVIDER_SITE_OTHER): Payer: Medicare Other | Admitting: Family Medicine

## 2022-04-06 VITALS — BP 130/80 | HR 57 | Temp 98.2°F | Resp 18 | Ht 65.0 in | Wt 167.8 lb

## 2022-04-06 DIAGNOSIS — Z789 Other specified health status: Secondary | ICD-10-CM

## 2022-04-06 DIAGNOSIS — R202 Paresthesia of skin: Secondary | ICD-10-CM

## 2022-04-06 DIAGNOSIS — R200 Anesthesia of skin: Secondary | ICD-10-CM

## 2022-04-06 DIAGNOSIS — I10 Essential (primary) hypertension: Secondary | ICD-10-CM | POA: Diagnosis not present

## 2022-04-06 DIAGNOSIS — G8929 Other chronic pain: Secondary | ICD-10-CM | POA: Diagnosis not present

## 2022-04-06 DIAGNOSIS — M25512 Pain in left shoulder: Secondary | ICD-10-CM

## 2022-04-06 DIAGNOSIS — E785 Hyperlipidemia, unspecified: Secondary | ICD-10-CM | POA: Diagnosis not present

## 2022-04-06 DIAGNOSIS — E118 Type 2 diabetes mellitus with unspecified complications: Secondary | ICD-10-CM | POA: Diagnosis not present

## 2022-04-06 LAB — HEMOGLOBIN A1C: Hgb A1c MFr Bld: 9.5 % — ABNORMAL HIGH (ref 4.6–6.5)

## 2022-04-06 LAB — CBC
HCT: 45.1 % (ref 39.0–52.0)
Hemoglobin: 14.5 g/dL (ref 13.0–17.0)
MCHC: 32.1 g/dL (ref 30.0–36.0)
MCV: 83.4 fl (ref 78.0–100.0)
Platelets: 134 10*3/uL — ABNORMAL LOW (ref 150.0–400.0)
RBC: 5.41 Mil/uL (ref 4.22–5.81)
RDW: 16.4 % — ABNORMAL HIGH (ref 11.5–15.5)
WBC: 6.4 10*3/uL (ref 4.0–10.5)

## 2022-04-06 LAB — VITAMIN B12: Vitamin B-12: 224 pg/mL (ref 211–911)

## 2022-04-06 LAB — COMPREHENSIVE METABOLIC PANEL
ALT: 10 U/L (ref 0–53)
AST: 16 U/L (ref 0–37)
Albumin: 4.2 g/dL (ref 3.5–5.2)
Alkaline Phosphatase: 85 U/L (ref 39–117)
BUN: 22 mg/dL (ref 6–23)
CO2: 30 mEq/L (ref 19–32)
Calcium: 9.5 mg/dL (ref 8.4–10.5)
Chloride: 101 mEq/L (ref 96–112)
Creatinine, Ser: 1.76 mg/dL — ABNORMAL HIGH (ref 0.40–1.50)
GFR: 38.24 mL/min — ABNORMAL LOW (ref >60.00)
Glucose, Bld: 379 mg/dL — ABNORMAL HIGH (ref 70–99)
Potassium: 4.5 mEq/L (ref 3.5–5.1)
Sodium: 137 mEq/L (ref 135–145)
Total Bilirubin: 0.5 mg/dL (ref 0.2–1.2)
Total Protein: 6.8 g/dL (ref 6.0–8.3)

## 2022-04-06 LAB — FOLATE: Folate: 11.7 ng/mL (ref >5.9)

## 2022-04-06 LAB — FERRITIN: Ferritin: 14.5 ng/mL — ABNORMAL LOW (ref 22.0–322.0)

## 2022-04-06 LAB — TSH: TSH: 1.27 u[IU]/mL (ref 0.35–5.50)

## 2022-04-06 MED ORDER — GABAPENTIN 300 MG PO CAPS: 300.0000 mg | ORAL_CAPSULE | Freq: Two times a day (BID) | ORAL | 3 refills | Status: DC

## 2022-04-06 NOTE — Patient Instructions (Signed)
It was good to see you today  Please start back on the crestor/ rosuvastatin for your cholesterol.  This is really important!    I will get you set up to see a shoulder specialist regarding your shoulder pain  I will be in touch with your labs

## 2022-04-25 ENCOUNTER — Ambulatory Visit (INDEPENDENT_AMBULATORY_CARE_PROVIDER_SITE_OTHER): Payer: Medicare Other

## 2022-04-25 ENCOUNTER — Ambulatory Visit: Payer: Medicare Other | Admitting: Orthopedic Surgery

## 2022-04-25 DIAGNOSIS — M5412 Radiculopathy, cervical region: Secondary | ICD-10-CM

## 2022-04-25 DIAGNOSIS — M25512 Pain in left shoulder: Secondary | ICD-10-CM

## 2022-04-25 NOTE — Progress Notes (Unsigned)
Office Visit Note   Patient: Barry [name]           Date of Birth: Feb 03, 1951           MRN: [id number] Visit Date: 04/25/2022 Requested by: Darreld Mclean, MD [address] [place],  [place] [address] PCP: Darreld Mclean, MD  Subjective: Chief Complaint  Patient presents with   Left Shoulder - Pain    HPI: Patient presents for evaluation of left shoulder pain.  Pain been going on for 3 months.  Denies a history of injury.  Patient states that he wakes from sleep at night with pain.  Has normalized pain with any movement.  Describes some radiation down below the elbow with numbness and tingling but denies any neck pain.  Takes Neurontin for symptoms.  Overhead is worse.  Does report some palmar numbness and tingling but has no history of neuropathy.  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  Does have diabetes as well.  Last hemoglobin A1c 9.5.              ROS: All systems reviewed are negative as they relate to the chief complaint within the history of present illness.  Patient denies  fevers or chills.   Assessment & Plan: Visit Diagnoses:  1. Left shoulder pain, unspecified chronicity   2. Radiculopathy, cervical region     Plan: Impression is left shoulder pain and neck pain.  Could be radiculopathy or intrinsic shoulder pathology.  Cervical spine x-rays do show some arthritis.  Symptoms ongoing for 3 months with unclear etiology between shoulder and neck.  Hesitate to do an injection because of his increased hemoglobin A1c.  Plan at this time is C-spine MRI to evaluate left radiculopathy and MRI arthrogram left shoulder to evaluate rotator cuff tear versus early adhesive capsulitis.  Follow-up after the studies.  Follow-Up Instructions: Return for after MRI.   Orders:  Orders Placed This Encounter  Procedures   XR Shoulder Left   XR Cervical Spine 2 or 3 views   MR Cervical Spine w/o contrast   MR Shoulder Left w/  contrast   Arthrogram   No orders of the defined types were placed in this encounter.     Procedures: No procedures performed   Clinical Data: No additional findings.  Objective: Vital Signs: There were no vitals taken for this visit.  Physical Exam:   Constitutional: Patient appears well-developed HEENT:  Head: Normocephalic Eyes:EOM are normal Neck: Normal range of motion Cardiovascular: Normal rate Pulmonary/chest: Effort normal Neurologic: Patient is alert Skin: Skin is warm Psychiatric: Patient has normal mood and affect   Ortho Exam: Ortho exam demonstrates shoulder range of motion on the right of 75/80/170.  Shoulder range of motion on the left 75/80/160.  Rotator cuff strength reasonable to infraspinatus extremity subscap muscle testing with no discrete tenderness AC joint left versus right.  Neck range of motion full.  Some paresthesias present in the palmar aspect of the hand bilaterally.  Negative Tinel's cubital tunnel with no subluxation of the ulnar nerve.  5 out of 5 grip EPL FPL interosseous wrist flexion extension bicep triceps and deltoid strength.  Specialty Comments:  No specialty comments available.  Imaging: XR Shoulder Left  Result Date: 04/25/2022 AP axillary outlet radiographs left shoulder reviewed.  Acromiohumeral distance maintained.  Sternal wires visible.  Visualized lung fields clear.  No acute fracture or dislocation.  No  glenohumeral joint or AC joint arthritis.  XR Cervical Spine 2 or 3 views  Result Date: 04/25/2022 AP lateral radiographs cervical spine reviewed.  Some loss of lordosis.  There is an ossicle on the posterior aspect of the C5 spinous process.  No facet arthritis.  Overall joint spaces well-maintained between the vertebral bodies    PMFS History: Patient Active Problem List   Diagnosis Date Noted   S/P CABG x 4 09/21/2021   Abnormal nuclear stress test 08/31/2021   PVC's (premature ventricular contractions) 07/21/2021    CKD (chronic kidney disease) stage 3, GFR 30-59 ml/min (HCC) 07/21/2021   HFmrEF (heart failure with mildly reduced EF) 09/2015   Atherosclerotic peripheral vascular disease with ulceration ([place]) 04/03/2015   Aneurysm of iliac artery (HCC) 03/18/2014   Abdominal pain, unspecified site 03/18/2014   Syncope in setting of nausea and vomiting 03/07/14 03/08/2014   Coronary artery disease 02/28/2014   Non compliance with medical treatment 02/28/2014   Dyslipidemia, goal LDL below 70 02/28/2014   Bradycardia- beta blocker decreased 02/28/2014   Chronic diastolic CHF (congestive heart failure) ([place]) 02/28/2014   Type 2 diabetes mellitus with manifestations ([place]) 02/27/2014   TIA (transient ischemic attack) 02/26/2014   History of non-ST elevation myocardial infarction (NSTEMI) 02/26/2014   Weakness-Left arm / Lef 12/06/2013   Mesenteric artery stenosis ([place]) 12/06/2013   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[place]) 12/03/2012   Hypertension 11/10/2011   History of CVA in adulthood 11/10/2011   Language barrier 11/10/2011   Past Medical History:  Diagnosis Date   Anginal pain ([place])    CAD (coronary artery disease)    LHC (02/26/14):  >> PCI:  Xience DES to pRCA // LHC (03/10/14):  >> PCI:  Xience DES to [facility] // Myoview 2/16: low risk // Cath 2016: patent stents; stable dz - med Rx // Myoview 10/22: anterolateral ischemia, inferoapical infarct w peri infarct ischemia; EF 41; High Risk // Cath 12/22: LCx stent occl, severe LAD and Dx dz, diffuse RCA disease >> CABG   Carotid stenosis    a. Carotid US (02/27/14):  Bilateral ICA 1-39%   CKD (chronic kidney disease) stage 3    Depression    Diabetes mellitus    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[place])    a. Mesenteric Artery Duplex (7/15):  pSMA chronic dissection with aneurysmal dilation of 1.29 cm (VVS);  b.  Chest CTA (02/26/14):  IMPRESSION:  1. No aortic  dissection or other acute abnormality.  2. 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  3.  Stable 17 mm ectasia of the left common iliac artery.  4. Atherosclerosis, including aortoiliac and coronary artery disease.    HFmrEF (heart failure with mildly reduced EF) 09/2015   Echo 6/15: EF 50-55 // Echo 1/17: mod LVH, EF 55%, inf-lat HK, Gr 1 DD, mild LAE // Echocardiogram 10/22: EF 40-45, Gr 1 DD, mildly reduced RVSF, RVSP 26.6, trivial MR, trivial AI, post and ant-lat HK   Hyperlipidemia    Hypertension    Iliac artery aneurysm, left (HCC)    Myocardial infarction (HCC)    PVC's (premature ventricular contractions) 07/21/2021   Monitor 10/22:  NSR avg HR 71; no AF/Flutter; no high grade HB or pathologic pauses PVC burden 20%, rare supraventricular beats w/o sustained arrhythmia    Stroke ([place]) 09/26/2009    Family History  Problem Relation Age of Onset   Diabetes Father    Hypertension Father    Heart attack Father    Stroke Neg Hx  Past Surgical History:  Procedure Laterality Date   CARDIAC CATHETERIZATION     CARDIAC CATHETERIZATION N/A 06/25/2015   Procedure: Left Heart Cath and Coronary Angiography;  Surgeon: Burnell Blanks, MD;  Location: [facility];  Service: Cardiovascular;  Laterality: N/A;   COLONOSCOPY WITH PROPOFOL N/A 07/23/2015   Procedure: COLONOSCOPY WITH PROPOFOL;  Surgeon: [name], MD;  Location: [facility];  Service: Endoscopy;  Laterality: N/A;   CORONARY ANGIOPLASTY     CORONARY ARTERY BYPASS GRAFT N/A 09/21/2021   Procedure: CORONARY ARTERY BYPASS GRAFTING (CABG)  X FOUR, ON PUMP, USING LEFT INTERNAL MAMMARY ARTERY AND RIGHT ENDOSCOPIC GREATER SAPHENOUS VEIN CONDUITS;  Surgeon: Lajuana Matte, MD;  Location: [facility];  Service: Open Heart Surgery;  Laterality: N/A;   ENDOVEIN HARVEST OF GREATER SAPHENOUS VEIN  09/21/2021   Procedure: ENDOVEIN HARVEST OF GREATER SAPHENOUS VEIN;  Surgeon: Lajuana Matte, MD;  Location: [facility];  Service: Open Heart Surgery;;   LEFT HEART CATH AND CORONARY ANGIOGRAPHY N/A 08/31/2021    Procedure: LEFT HEART CATH AND CORONARY ANGIOGRAPHY;  Surgeon: Belva Crome, MD;  Location: [facility];  Service: Cardiovascular;  Laterality: N/A;   LEFT HEART CATHETERIZATION WITH CORONARY ANGIOGRAM N/A 02/26/2014   Procedure: LEFT HEART CATHETERIZATION WITH CORONARY ANGIOGRAM;  Surgeon: Wellington Hampshire, MD;  Location: [facility];  Service: Cardiovascular;  Laterality: N/A;   LEFT HEART CATHETERIZATION WITH CORONARY ANGIOGRAM N/A 03/10/2014   Procedure: LEFT HEART CATHETERIZATION WITH CORONARY ANGIOGRAM;  Surgeon: Jettie Booze, MD;  Location: [facility];  Service: Cardiovascular;  Laterality: N/A;   TEE WITHOUT CARDIOVERSION N/A 09/21/2021   Procedure: TRANSESOPHAGEAL ECHOCARDIOGRAM (TEE);  Surgeon: Lajuana Matte, MD;  Location: [facility];  Service: Open Heart Surgery;  Laterality: N/A;   Social History   Occupational History   Occupation: Retired  Tobacco Use   Smoking status: Former    Packs/day: 0.25    Years: 38.00    Total pack years: 9.50    Types: Cigarettes    Quit date: 2008    Years since quitting: 15.5   Smokeless tobacco: Never  Vaping Use   Vaping Use: Never used  Substance and Sexual Activity   Alcohol use: No    Alcohol/week: 0.0 standard drinks of alcohol   Drug use: No   Sexual activity: Yes

## 2022-04-28 ENCOUNTER — Encounter: Payer: Self-pay | Admitting: Orthopedic Surgery

## 2022-05-09 ENCOUNTER — Other Ambulatory Visit: Payer: Medicare Other

## 2022-05-09 ENCOUNTER — Inpatient Hospital Stay: Admission: RE | Admit: 2022-05-09 | Payer: Medicare Other | Source: Ambulatory Visit

## 2022-05-10 ENCOUNTER — Other Ambulatory Visit: Payer: Self-pay

## 2022-05-10 ENCOUNTER — Other Ambulatory Visit: Payer: Self-pay | Admitting: *Deleted

## 2022-05-10 ENCOUNTER — Telehealth: Payer: Self-pay | Admitting: Family Medicine

## 2022-05-10 DIAGNOSIS — E118 Type 2 diabetes mellitus with unspecified complications: Secondary | ICD-10-CM

## 2022-05-10 MED ORDER — INSULIN GLARGINE 100 UNIT/ML ~~LOC~~ SOLN: 22.0000 [IU] | Freq: Every day | SUBCUTANEOUS | 1 refills | Status: DC

## 2022-05-10 NOTE — Telephone Encounter (Signed)
Medication: insulin glargine (LANTUS) 100 UNIT/ML injection [[id number]]   Has the patient contacted their pharmacy? Yes.   Provider needs to authorize another refill  Preferred Pharmacy (with phone number or street name): [facility] ([address]), [place] - [place]  [address])  [address]  Phone:  [phone]  Fax:  [phone]   Agent: Please be advised that RX refills may take up to 3 business days. We ask that you follow-up with your pharmacy.

## 2022-05-10 NOTE — Telephone Encounter (Signed)
Refill sent to pharmacy.   

## 2022-05-13 ENCOUNTER — Other Ambulatory Visit: Payer: Self-pay | Admitting: Family Medicine

## 2022-05-13 ENCOUNTER — Telehealth: Payer: Self-pay | Admitting: Family Medicine

## 2022-05-13 DIAGNOSIS — E118 Type 2 diabetes mellitus with unspecified complications: Secondary | ICD-10-CM

## 2022-05-13 MED ORDER — TECHLITE PEN NEEDLES 32G X 8 MM MISC: 3 refills | Status: DC

## 2022-05-13 MED ORDER — ONETOUCH DELICA PLUS LANCET33G MISC: 12 refills | Status: DC

## 2022-05-13 MED ORDER — ONETOUCH ULTRA VI STRP: ORAL_STRIP | 12 refills | Status: DC

## 2022-05-13 NOTE — Telephone Encounter (Signed)
Medication: ONETOUCH ULTRA test strip  Insulin Pen Needle (TECHLITE PEN NEEDLES) 32G X 8 MM MISC   Has the patient contacted their pharmacy? Yes.    [facility] ([address] [place]   [address] ([place]) [place] [address]  Phone:  [phone]  Fax:  [phone]

## 2022-05-13 NOTE — Telephone Encounter (Signed)
Rx sent 

## 2022-05-18 ENCOUNTER — Encounter: Payer: Self-pay | Admitting: Internal Medicine

## 2022-05-18 ENCOUNTER — Ambulatory Visit: Payer: Medicare Other | Admitting: Orthopedic Surgery

## 2022-05-19 ENCOUNTER — Other Ambulatory Visit: Payer: Medicare Other

## 2022-05-23 ENCOUNTER — Ambulatory Visit: Payer: Medicare Other | Attending: Cardiovascular Disease

## 2022-05-23 ENCOUNTER — Encounter: Payer: Self-pay | Admitting: Cardiovascular Disease

## 2022-05-23 ENCOUNTER — Ambulatory Visit: Payer: Medicare Other | Attending: Cardiovascular Disease | Admitting: Cardiovascular Disease

## 2022-05-23 VITALS — BP 140/74 | HR 52 | Ht 65.0 in | Wt 171.6 lb

## 2022-05-23 DIAGNOSIS — E782 Mixed hyperlipidemia: Secondary | ICD-10-CM | POA: Diagnosis not present

## 2022-05-23 DIAGNOSIS — I5020 Unspecified systolic (congestive) heart failure: Secondary | ICD-10-CM

## 2022-05-23 DIAGNOSIS — I10 Essential (primary) hypertension: Secondary | ICD-10-CM

## 2022-05-23 DIAGNOSIS — R001 Bradycardia, unspecified: Secondary | ICD-10-CM

## 2022-05-23 DIAGNOSIS — N1831 Chronic kidney disease, stage 3a: Secondary | ICD-10-CM

## 2022-05-23 DIAGNOSIS — I25118 Atherosclerotic heart disease of native coronary artery with other forms of angina pectoris: Secondary | ICD-10-CM | POA: Diagnosis not present

## 2022-05-23 NOTE — Progress Notes (Signed)
Cardiology Office Note:    Date:  05/23/2022   ID:  Barry [name], DOB 06/30/1951, MRN [id number]  PCP:  Darreld Mclean, MD   [facility] Cardiologist:  Sherren Mocha, MD Cardiology APP:  Sharmon Revere     Referring MD: Darreld Mclean, MD   Chief Complaint  Patient presents with   Coronary Artery Disease    History of Present Illness:    Barry [name] is a 71 y.o. male with a hx of coronary artery disease status post CABG (2022).  At that time he was treated with a LIMA to LAD, saphenous vein graft to PDA, saphenous vein graft first diagonal, and saphenous vein graft to obtuse marginal.  Comorbid conditions include hyperlipidemia, congestive heart failure with mildly reduced ejection fraction, chronic SMA dissection, and peripheral arterial disease with penetrating ulcer in the left common iliac artery, remote stroke in 2011, diabetes, chronic kidney disease, hypertension, and abdominal aortic aneurysm.  The patient has a very complex medical history summarized above.  He is here with his son today who acts as his Optometrist.  Over the last several days he has experienced a feeling of "hotness" in his chest when he goes outside.  This is associated with total loss of energy.  He does not experience a chest pain similar to what he had at the time of his MI.  He denies shortness of breath, edema, or lightheadedness.  He has had some palpitations.  He is taking his medicines regularly.  Reports that his Lantus insulin uses 1-2 times daily and he oftentimes does not take his second dose depending on his blood sugar reading. Past Medical History:  Diagnosis Date   Anginal pain ([place])    CAD (coronary artery disease)    LHC (02/26/14):  >> PCI:  Xience DES to pRCA // LHC (03/10/14):  >> PCI:  Xience DES to [facility] // Myoview 2/16: low risk // Cath 2016: patent stents; stable dz - med Rx // Myoview 10/22: anterolateral ischemia, inferoapical infarct w peri infarct  ischemia; EF 41; High Risk // Cath 12/22: LCx stent occl, severe LAD and Dx dz, diffuse RCA disease >> CABG   Carotid stenosis    a. Carotid US (02/27/14):  Bilateral ICA 1-39%   CKD (chronic kidney disease) stage 3    Depression    Diabetes mellitus    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[place])    a. Mesenteric Artery Duplex (7/15):  pSMA chronic dissection with aneurysmal dilation of 1.29 cm (VVS);  b.  Chest CTA (02/26/14):  IMPRESSION:  1. No aortic  dissection or other acute abnormality.  2. 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  3. Stable 17 mm ectasia of the left common iliac artery.  4. Atherosclerosis, including aortoiliac and coronary artery disease.    HFmrEF (heart failure with mildly reduced EF) 09/2015   Echo 6/15: EF 50-55 // Echo 1/17: mod LVH, EF 55%, inf-lat HK, Gr 1 DD, mild LAE // Echocardiogram 10/22: EF 40-45, Gr 1 DD, mildly reduced RVSF, RVSP 26.6, trivial MR, trivial AI, post and ant-lat HK   Hyperlipidemia    Hypertension    Iliac artery aneurysm, left (HCC)    Myocardial infarction ([place])    PVC's (premature ventricular contractions) 07/21/2021   Monitor 10/22:  NSR avg HR 71; no AF/Flutter; no high grade HB or pathologic pauses PVC burden 20%, rare supraventricular beats w/o sustained arrhythmia    Stroke ([place]) 09/26/2009    Past  Surgical History:  Procedure Laterality Date   CARDIAC CATHETERIZATION     CARDIAC CATHETERIZATION N/A 06/25/2015   Procedure: Left Heart Cath and Coronary Angiography;  Surgeon: Burnell Blanks, MD;  Location: [place] [facility];  Service: Cardiovascular;  Laterality: N/A;   COLONOSCOPY WITH PROPOFOL N/A 07/23/2015   Procedure: COLONOSCOPY WITH PROPOFOL;  Surgeon: [name], MD;  Location: [facility];  Service: Endoscopy;  Laterality: N/A;   CORONARY ANGIOPLASTY     CORONARY ARTERY BYPASS GRAFT N/A 09/21/2021   Procedure: CORONARY ARTERY BYPASS GRAFTING (CABG)  X FOUR, ON PUMP, USING LEFT INTERNAL MAMMARY ARTERY AND  RIGHT ENDOSCOPIC GREATER SAPHENOUS VEIN CONDUITS;  Surgeon: Lajuana Matte, MD;  Location: [place];  Service: Open Heart Surgery;  Laterality: N/A;   ENDOVEIN HARVEST OF GREATER SAPHENOUS VEIN  09/21/2021   Procedure: ENDOVEIN HARVEST OF GREATER SAPHENOUS VEIN;  Surgeon: Lajuana Matte, MD;  Location: [place];  Service: Open Heart Surgery;;   LEFT HEART CATH AND CORONARY ANGIOGRAPHY N/A 08/31/2021   Procedure: LEFT HEART CATH AND CORONARY ANGIOGRAPHY;  Surgeon: Belva Crome, MD;  Location: [place] [facility];  Service: Cardiovascular;  Laterality: N/A;   LEFT HEART CATHETERIZATION WITH CORONARY ANGIOGRAM N/A 02/26/2014   Procedure: LEFT HEART CATHETERIZATION WITH CORONARY ANGIOGRAM;  Surgeon: Wellington Hampshire, MD;  Location: [place] CATH [facility];  Service: Cardiovascular;  Laterality: N/A;   LEFT HEART CATHETERIZATION WITH CORONARY ANGIOGRAM N/A 03/10/2014   Procedure: LEFT HEART CATHETERIZATION WITH CORONARY ANGIOGRAM;  Surgeon: Jettie Booze, MD;  Location: [facility] CATH LAB;  Service: Cardiovascular;  Laterality: N/A;   TEE WITHOUT CARDIOVERSION N/A 09/21/2021   Procedure: TRANSESOPHAGEAL ECHOCARDIOGRAM (TEE);  Surgeon: Lajuana Matte, MD;  Location: [place];  Service: Open Heart Surgery;  Laterality: N/A;    Current Medications: Current Meds  Medication Sig   acetaminophen (TYLENOL) 325 MG tablet Take 2 tablets (650 mg total) by mouth every 6 (six) hours as needed for mild pain, fever or headache.   aspirin 81 MG EC tablet Take 1 tablet (81 mg total) by mouth daily.   blood glucose meter kit and supplies Dispense based on patient and insurance preference. Use up to four times daily as directed. (FOR ICD-10 E10.9, E11.9).   gabapentin (NEURONTIN) 300 MG capsule Take 1 capsule (300 mg total) by mouth 2 (two) times daily.   glucose blood (ONETOUCH ULTRA) test strip USE  STRIP TO CHECK GLUCOSE TWICE DAILY   insulin glargine (LANTUS) 100 UNIT/ML injection Inject 0.22-0.27 mLs (22-27 Units total)  into the skin daily.   Insulin Pen Needle (TECHLITE PEN NEEDLES) 32G X 8 MM MISC Use daily to administer insulin   Lancets (ONETOUCH DELICA PLUS [id number]) MISC Check blood sugars twice daily   lisinopril (ZESTRIL) 20 MG tablet Take 20 mg by mouth daily. Patient taking 1/2 tablet daily   metoprolol tartrate (LOPRESSOR) 25 MG tablet Take 1 tablet (25 mg total) by mouth 2 (two) times daily.   rosuvastatin (CRESTOR) 40 MG tablet Take 1 tablet (40 mg total) by mouth daily.     Allergies:   Metformin and related   Social History   Socioeconomic History   Marital status: Married    Spouse name: Not on file   Number of children: 3   Years of education: Not on file   Highest education level: Not on file  Occupational History   Occupation: Retired  Tobacco Use   Smoking status: Former    Packs/day: 0.25  Years: 38.00    Total pack years: 9.50    Types: Cigarettes    Quit date: 2008    Years since quitting: 15.6   Smokeless tobacco: Never  Vaping Use   Vaping Use: Never used  Substance and Sexual Activity   Alcohol use: No    Alcohol/week: 0.0 standard drinks of alcohol   Drug use: No   Sexual activity: Yes  Other Topics Concern   Not on file  Social History Narrative   Marital: married.      Lives: with son,wife.       Children: 3 children; 6 grandchildren      Employed: unemployed; disability unknown reason/CVA L sided weakness      Tobacco:  Quit 2012; smoked 40 years.       Alcohol: no drinking now; social in past.       Drugs: none       Exercise: sporadic.       ADLs:  No driving since CVA.   Social Determinants of Health   Financial Resource Strain: Low Risk  (05/28/2021)   Overall Financial Resource Strain (CARDIA)    Difficulty of Paying Living Expenses: Not very hard  Food Insecurity: Food Insecurity Present (11/12/2021)   Hunger Vital Sign    Worried About Running Out of Food in the Last Year: Sometimes true    Ran Out of Food in the Last Year: Never true   Transportation Needs: No Transportation Needs (06/07/2021)   PRAPARE - Hydrologist (Medical): No    Lack of Transportation (Non-Medical): No  Physical Activity: Inactive (11/12/2021)   Exercise Vital Sign    Days of Exercise per Week: 0 days    Minutes of Exercise per Session: 0 min  Stress: Not on file  Social Connections: Not on file     Family History: The patient's family history includes Diabetes in his father; Heart attack in his father; Hypertension in his father. There is no history of Stroke.  ROS:   Please see the history of present illness.    All other systems reviewed and are negative.  EKGs/Labs/Other Studies Reviewed:    The following studies were reviewed today: Echo 07/14/21: 1. Frequent PVCs during exam.   2. Left ventricular ejection fraction, by estimation, is 40 to 45% with  beat to beat variability. The left ventricle has mildly decreased  function. The left ventricle demonstrates regional wall motion  abnormalities (see scoring diagram/findings for  description). There is mild left ventricular hypertrophy. Left ventricular  diastolic parameters are consistent with Grade I diastolic dysfunction  (impaired relaxation).   3. Right ventricular systolic function is mildly reduced. The right  ventricular size is normal. There is normal pulmonary artery systolic  pressure. The estimated right ventricular systolic pressure is 91.6 mmHg.   4. The mitral valve is grossly normal. Trivial mitral valve  regurgitation. No evidence of mitral stenosis.   5. The aortic valve is grossly normal. There is mild calcification of the  aortic valve. Aortic valve regurgitation is trivial. No aortic stenosis is  present.   6. The inferior vena cava is normal in size with greater than 50%  respiratory variability, suggesting right atrial pressure of 3 mmHg.   Cardiac Cath 08/31/21: CONCLUSIONS: Severe mid LAD disease.  Severe first diagonal  disease. Total occlusion of the proximal to mid circumflex after the first obtuse marginal within a previously placed stent.  Circumflex has left to left collaterals. Right coronary contains  a proximal stent with 40% ISR, distal RCA contains 60 to 70% stenosis, continuation of RCA contains segmental 50% stenosis, a small third left ventricular branch contains 75% ostial to proximal stenosis. Diffuse 30% mid to distal left main narrowing Left ventricular dysfunction with anteroapical severe hypokinesis.  LVEF 40%.  LVEDP is normal.   RECOMMENDATION:   71 year old diabetic with total occlusion due to in-stent restenosis in the circumflex, severe disease in the proximal to mid LAD and first diagonal with moderate disease in the right coronary.  Given decreased LV function, coronary artery surgical revascularization is indicated.  We will arrange an appointment with the surgical team within the next week if possible.  EKG:  EKG is ordered today.  The ekg ordered today demonstrates sinus bradycardia 52 bpm, nonspecific ST and T wave abnormality.  Recent Labs: 09/22/2021: Magnesium 2.2 04/06/2022: ALT 10; BUN 22; Creatinine, Ser 1.76; Hemoglobin 14.5; Platelets 134.0; Potassium 4.5; Sodium 137; TSH 1.27  Recent Lipid Panel    Component Value Date/Time   CHOL 179 06/07/2021 1355   CHOL 92 (L) 05/11/2020 1020   TRIG 273.0 (H) 06/07/2021 1355   HDL 36.00 (L) 06/07/2021 1355   HDL 34 (L) 05/11/2020 1020   CHOLHDL 5 06/07/2021 1355   VLDL 54.6 (H) 06/07/2021 1355   LDLCALC 35 05/11/2020 1020   LDLDIRECT 53 10/12/2021 1033   LDLDIRECT 124.0 06/07/2021 1355     Risk Assessment/Calculations:      HYPERTENSION CONTROL Vitals:   05/23/22 1010 05/23/22 1056  BP: (!) 142/80 (!) 140/74    The patient's blood pressure is elevated above target today.  In order to address the patient's elevated BP: Blood pressure will be monitored at home to determine if medication changes need to be made.         Physical Exam:    VS:  BP (!) 140/74   Pulse (!) 52   Ht $R'5\' 5"'FF$  (1.651 m)   Wt 171 lb 9.6 oz (77.8 kg)   SpO2 97%   BMI 28.56 kg/m     Wt Readings from Last 3 Encounters:  05/23/22 171 lb 9.6 oz (77.8 kg)  04/06/22 167 lb 12.8 oz (76.1 kg)  01/10/22 170 lb 9.6 oz (77.4 kg)     GEN:  Well nourished, well developed in no acute distress HEENT: Normal NECK: No JVD; No carotid bruits LYMPHATICS: No lymphadenopathy CARDIAC: Bradycardic and regular, no murmurs, rubs, gallops RESPIRATORY:  Clear to auscultation without rales, wheezing or rhonchi  ABDOMEN: Soft, non-tender, non-distended MUSCULOSKELETAL:  No edema; No deformity  SKIN: Warm and dry NEUROLOGIC:  Alert and oriented x 3 PSYCHIATRIC:  Normal affect   ASSESSMENT:    1. Essential hypertension   2. Mixed hyperlipidemia   3. HFmrEF (heart failure with mildly reduced EF)   4. Coronary artery disease involving native coronary artery of native heart with other form of angina pectoris (HCC)   5. Stage 3a chronic kidney disease ([place])   6. Bradycardia    PLAN:    In order of problems listed above:  Blood pressure on my recheck today is 140/74.  He will continue on metoprolol and lisinopril. Treated with rosuvastatin 40 mg daily.  Most recent labs with a cholesterol of 179, HDL 36, LDL 35, triglycerides 273.  Hypertriglyceridemia likely related to poor glycemic control (hemoglobin A1c 9.5).  Counseled the patient regarding low glycemic diet with specific discussion of foods to avoid. LVEF by echo was 40 to 45%.  Patient treated with lisinopril and  metoprolol.  We will check a Myoview stress scan to reassess LVEF and ischemic burden (see below).  No clear symptoms of heart failure and no signs of volume excess on exam. The patient is having some atypical chest discomfort.  With the language barrier it is somewhat hard to sort out.  However his symptoms sound fairly profound and associated with marked fatigue.  With his  uncontrolled diabetes, he is at risk for graft failure.  I have recommended a Lexiscan Myoview stress test for further assessment.  He otherwise will continue on aspirin, beta-blocker, and ACE inhibitor in the setting of diabetes, and high intensity statin drug. Last labs with a creatinine of 1.76.  Discussed this with his son and he reports that the patient is drinking a lot of water with adequate intake.  He understands the need to avoid nephrotoxic drugs.  He is appropriately treated with an ACE inhibitor. Patient complains of weakness and lack of energy.  I am going to check a 3-day ZIO monitor in the setting of his bradycardia to make sure that he is not having heart block or other arrhythmic events.      Shared Decision Making/Informed Consent The risks [chest pain, shortness of breath, cardiac arrhythmias, dizziness, blood pressure fluctuations, myocardial infarction, stroke/transient ischemic attack, nausea, vomiting, allergic reaction, radiation exposure, metallic taste sensation and life-threatening complications (estimated to be 1 in 10,000)], benefits (risk stratification, diagnosing coronary artery disease, treatment guidance) and alternatives of a nuclear stress test were discussed in detail with Barry [name] and he agrees to proceed.    Medication Adjustments/Labs and Tests Ordered: Current medicines are reviewed at length with the patient today.  Concerns regarding medicines are outlined above.  Orders Placed This Encounter  Procedures   MYOCARDIAL PERFUSION IMAGING   LONG TERM MONITOR (3-14 DAYS)   EKG 12-Lead   No orders of the defined types were placed in this encounter.   Patient Instructions  Medication Instructions:  Your physician recommends that you continue on your current medications as directed. Please refer to the Current Medication list given to you today.  *If you need a refill on your cardiac medications before your next appointment, please call your  pharmacy*   Lab Work: NONE If you have labs (blood work) drawn today and your tests are completely normal, you will receive your results only by: [place] (if you have MyChart) OR A paper copy in the mail If you have any lab test that is abnormal or we need to change your treatment, we will call you to review the results.   Testing/Procedures: [place] Test Your physician has requested that you have a lexiscan myoview. For further information please visit [email or web address]. Please follow instruction sheet, as given.  Zio monitor x 3 days Your physician has recommended that you wear an event monitor. Event monitors are medical devices that record the heart's electrical activity. Doctors most often [place] these monitors to diagnose arrhythmias. Arrhythmias are problems with the speed or rhythm of the heartbeat. The monitor is a small, portable device. You can wear one while you do your normal daily activities. This is usually used to diagnose what is causing palpitations/syncope (passing out).  Follow-Up: At [facility], you and your health needs are our priority.  As part of our continuing mission to provide you with exceptional heart care, we have created designated Provider Care Teams.  These Care Teams include your primary Cardiologist (physician) and Advanced Practice Providers (APPs -  Physician Assistants  and Nurse Practitioners) who all work together to provide you with the care you need, when you need it.  We recommend signing up for the patient portal called "MyChart".  Sign up information is provided on this After Visit Summary.  MyChart is used to connect with patients for Virtual Visits (Telemedicine).  Patients are able to view lab/test results, encounter notes, upcoming appointments, etc.  Non-urgent messages can be sent to your provider as well.   To learn more about what you can do with MyChart, go to [email or web address].    Your next  appointment:   4 month(s)  The format for your next appointment:   In Person  Provider:   Christen Bame, Ambrose Pancoast, Richardson Dopp, or Nicholes Rough    Other Instructions See sepatate instructions for stress test ZIO XT- Long Term Monitor Instructions  Your physician has requested you wear a ZIO patch monitor for 3 days.  This is a single patch monitor. [employer] supplies one patch monitor per enrollment. Additional stickers are not available. Please do not apply patch if you will be having a Nuclear Stress Test,  Echocardiogram, Cardiac CT, MRI, or Chest Xray during the period you would be wearing the  monitor. The patch cannot be worn during these tests. You cannot remove and re-apply the  ZIO XT patch monitor.  Your ZIO patch monitor will be mailed 3 day USPS to your address on file. It may take 3-5 days  to receive your monitor after you have been enrolled.  Once you have received your monitor, please review the enclosed instructions. Your monitor  has already been registered assigning a specific monitor serial # to you.  Billing and Patient Assistance Program Information  We have supplied [employer] with any of your insurance information on file for billing purposes. [employer] offers a sliding scale Patient Assistance Program for patients that do not have  insurance, or whose insurance does not completely cover the cost of the ZIO monitor.  You must apply for the Patient Assistance Program to qualify for this discounted rate.  To apply, please call [employer] at [phone], select option 4, select option 2, ask to apply for  Patient Assistance Program. Theodore Demark will ask your household income, and how many people  are in your household. They will quote your out-of-pocket cost based on that information.  [employer] will also be able to set up a 37-month interest-free payment plan if needed.  Applying the monitor   Shave hair from upper left chest.  Hold abrader disc by orange tab. Rub  abrader in 40 strokes over the upper left chest as  indicated in your monitor instructions.  Clean area with 4 enclosed alcohol pads. Let dry.  Apply patch as indicated in monitor instructions. Patch will be placed under collarbone on left  side of chest with arrow pointing upward.  Rub patch adhesive wings for 2 minutes. Remove white label marked "1". Remove the white  label marked "2". Rub patch adhesive wings for 2 additional minutes.  While looking in a mirror, press and release button in center of patch. A small green light will  flash 3-4 times. This will be your only indicator that the monitor has been turned on.  Do not shower for the first 24 hours. You may shower after the first 24 hours.  Press the button if you feel a symptom. You will hear a small click. Record Date, Time and  Symptom in the Patient Logbook.  When you are ready to remove  the patch, follow instructions on the last 2 pages of Patient  Logbook. Stick patch monitor onto the last page of Patient Logbook.  Place Patient Logbook in the blue and white box. Use locking tab on box and tape box closed  securely. The blue and white box has prepaid postage on it. Please place it in the mailbox as  soon as possible. Your physician should have your test results approximately 7 days after the  monitor has been mailed back to [facility].  Call [facility] at [phone] if you have questions regarding  your ZIO XT patch monitor. Call them immediately if you see an orange light blinking on your  monitor.  If your monitor falls off in less than 4 days, contact our Monitor department at [phone].  If your monitor becomes loose or falls off after 4 days call [employer] at [phone] for  suggestions on securing your monitor  Important Information About Sugar      .instct   Signed, Sherren Mocha, MD  05/23/2022 10:57 AM    [place]

## 2022-05-23 NOTE — Patient Instructions (Addendum)
Medication Instructions:  Your physician recommends that you continue on your current medications as directed. Please refer to the Current Medication list given to you today.  *If you need a refill on your cardiac medications before your next appointment, please call your pharmacy*   Lab Work: NONE If you have labs (blood work) drawn today and your tests are completely normal, you will receive your results only by: [place] (if you have MyChart) OR A paper copy in the mail If you have any lab test that is abnormal or we need to change your treatment, we will call you to review the results.   Testing/Procedures: [place]bin Test Your physician has requested that you have a lexiscan myoview. For further information please visit [email or web address]. Please follow instruction sheet, as given.  Zio monitor x 3 days Your physician has recommended that you wear an event monitor. Event monitors are medical devices that record the heart's electrical activity. Doctors most often [place] these monitors to diagnose arrhythmias. Arrhythmias are problems with the speed or rhythm of the heartbeat. The monitor is a small, portable device. You can wear one while you do your normal daily activities. This is usually used to diagnose what is causing palpitations/syncope (passing out).  Follow-Up: At [facility], you and your health needs are our priority.  As part of our continuing mission to provide you with exceptional heart care, we have created designated Provider Care Teams.  These Care Teams include your primary Cardiologist (physician) and Advanced Practice Providers (APPs -  Physician Assistants and Nurse Practitioners) who all work together to provide you with the care you need, when you need it.  We recommend signing up for the patient portal called "MyChart".  Sign up information is provided on this After Visit Summary.  MyChart is used to connect with patients for Virtual  Visits (Telemedicine).  Patients are able to view lab/test results, encounter notes, upcoming appointments, etc.  Non-urgent messages can be sent to your provider as well.   To learn more about what you can do with MyChart, go to [email or web address].    Your next appointment:   4 month(s)  The format for your next appointment:   In Person  Provider:   Christen Bame, Ambrose Pancoast, Richardson Dopp, or Nicholes Rough    Other Instructions See sepatate instructions for stress test ZIO XT- Long Term Monitor Instructions  Your physician has requested you wear a ZIO patch monitor for 3 days.  This is a single patch monitor. [employer] supplies one patch monitor per enrollment. Additional stickers are not available. Please do not apply patch if you will be having a Nuclear Stress Test,  Echocardiogram, Cardiac CT, MRI, or Chest Xray during the period you would be wearing the  monitor. The patch cannot be worn during these tests. You cannot remove and re-apply the  ZIO XT patch monitor.  Your ZIO patch monitor will be mailed 3 day USPS to your address on file. It may take 3-5 days  to receive your monitor after you have been enrolled.  Once you have received your monitor, please review the enclosed instructions. Your monitor  has already been registered assigning a specific monitor serial # to you.  Billing and Patient Assistance Program Information  We have supplied [employer] with any of your insurance information on file for billing purposes. [employer] offers a sliding scale Patient Assistance Program for patients that do not have  insurance, or whose insurance does not completely cover the cost  of the ZIO monitor.  You must apply for the Patient Assistance Program to qualify for this discounted rate.  To apply, please call [employer] at [phone], select option 4, select option 2, ask to apply for  Patient Assistance Program. Theodore Demark will ask your household income, and how many people  are in  your household. They will quote your out-of-pocket cost based on that information.  [employer] will also be able to set up a 43-month interest-free payment plan if needed.  Applying the monitor   Shave hair from upper left chest.  Hold abrader disc by orange tab. Rub abrader in 40 strokes over the upper left chest as  indicated in your monitor instructions.  Clean area with 4 enclosed alcohol pads. Let dry.  Apply patch as indicated in monitor instructions. Patch will be placed under collarbone on left  side of chest with arrow pointing upward.  Rub patch adhesive wings for 2 minutes. Remove white label marked "1". Remove the white  label marked "2". Rub patch adhesive wings for 2 additional minutes.  While looking in a mirror, press and release button in center of patch. A small green light will  flash 3-4 times. This will be your only indicator that the monitor has been turned on.  Do not shower for the first 24 hours. You may shower after the first 24 hours.  Press the button if you feel a symptom. You will hear a small click. Record Date, Time and  Symptom in the Patient Logbook.  When you are ready to remove the patch, follow instructions on the last 2 pages of Patient  Logbook. Stick patch monitor onto the last page of Patient Logbook.  Place Patient Logbook in the blue and white box. Use locking tab on box and tape box closed  securely. The blue and white box has prepaid postage on it. Please place it in the mailbox as  soon as possible. Your physician should have your test results approximately 7 days after [facility]  [facility]  Call [employer] [phone] you have questions regarding  your ZIO XT patch monitor. Call them immediately if you see an orange light blinking on your  monitor.  If your monitor falls off in less than 4 days, contact our Monitor department at [phone]  If your monitor becomes loose or falls off  after 4 days call [employer] at [phone]  suggestions on securing your monitor  Important Information About Sugar      .instct

## 2022-05-23 NOTE — Progress Notes (Unsigned)
Enrolled for [employer] to mail a ZIO XT long term holter monitor to the patients address on file.  

## 2022-05-28 DIAGNOSIS — I5020 Unspecified systolic (congestive) heart failure: Secondary | ICD-10-CM

## 2022-05-28 DIAGNOSIS — R001 Bradycardia, unspecified: Secondary | ICD-10-CM

## 2022-05-28 DIAGNOSIS — I10 Essential (primary) hypertension: Secondary | ICD-10-CM | POA: Diagnosis not present

## 2022-05-28 DIAGNOSIS — E782 Mixed hyperlipidemia: Secondary | ICD-10-CM

## 2022-05-28 DIAGNOSIS — N1831 Chronic kidney disease, stage 3a: Secondary | ICD-10-CM | POA: Diagnosis not present

## 2022-05-28 DIAGNOSIS — I25118 Atherosclerotic heart disease of native coronary artery with other forms of angina pectoris: Secondary | ICD-10-CM

## 2022-06-01 ENCOUNTER — Telehealth (HOSPITAL_COMMUNITY): Payer: Self-pay | Admitting: *Deleted

## 2022-06-01 NOTE — Telephone Encounter (Signed)
Patient's son given detailed instructions per Myocardial Perfusion Study Information Sheet for the test on 06/03/2022 at 10:00. Patient notified to arrive 15 minutes early and that it is imperative to arrive on time for appointment to keep from having the test rescheduled.  If you need to cancel or reschedule your appointment, please call the office within 24 hours of your appointment. . Patient verbalized understanding.Barry [name]

## 2022-06-03 ENCOUNTER — Ambulatory Visit (HOSPITAL_COMMUNITY): Payer: Medicare Other | Attending: Cardiovascular Disease

## 2022-06-03 DIAGNOSIS — I5020 Unspecified systolic (congestive) heart failure: Secondary | ICD-10-CM | POA: Diagnosis not present

## 2022-06-03 DIAGNOSIS — I10 Essential (primary) hypertension: Secondary | ICD-10-CM | POA: Insufficient documentation

## 2022-06-03 DIAGNOSIS — R001 Bradycardia, unspecified: Secondary | ICD-10-CM | POA: Diagnosis not present

## 2022-06-03 DIAGNOSIS — N1831 Chronic kidney disease, stage 3a: Secondary | ICD-10-CM | POA: Insufficient documentation

## 2022-06-03 DIAGNOSIS — E782 Mixed hyperlipidemia: Secondary | ICD-10-CM | POA: Diagnosis not present

## 2022-06-03 DIAGNOSIS — I25118 Atherosclerotic heart disease of native coronary artery with other forms of angina pectoris: Secondary | ICD-10-CM | POA: Insufficient documentation

## 2022-06-03 LAB — MYOCARDIAL PERFUSION IMAGING
LV dias vol: 103 mL (ref 62–150)
LV sys vol: 45 mL
Nuc Stress EF: 56 %
Peak HR: 75 {beats}/min
Rest HR: 49 {beats}/min
Rest Nuclear Isotope Dose: 10.2 mCi
SDS: 8
SRS: 1
SSS: 9
ST Depression (mm): 0 mm
Stress Nuclear Isotope Dose: 32.6 mCi
TID: 1.04

## 2022-06-03 MED ORDER — REGADENOSON 0.4 MG/5ML IV SOLN
0.4000 mg | Freq: Once | INTRAVENOUS | Status: AC
  Administered 2022-06-03: 0.4 mg via INTRAVENOUS

## 2022-06-03 MED ORDER — TECHNETIUM TC 99M TETROFOSMIN IV KIT
10.2000 | PACK | Freq: Once | INTRAVENOUS | Status: AC | PRN
  Administered 2022-06-03: 10.2 via INTRAVENOUS

## 2022-06-03 MED ORDER — TECHNETIUM TC 99M TETROFOSMIN IV KIT
32.6000 | PACK | Freq: Once | INTRAVENOUS | Status: AC | PRN
  Administered 2022-06-03: 32.6 via INTRAVENOUS

## 2022-06-06 DIAGNOSIS — I10 Essential (primary) hypertension: Secondary | ICD-10-CM | POA: Diagnosis not present

## 2022-06-06 DIAGNOSIS — I5020 Unspecified systolic (congestive) heart failure: Secondary | ICD-10-CM | POA: Diagnosis not present

## 2022-06-27 ENCOUNTER — Ambulatory Visit: Payer: Medicare Other | Attending: Cardiovascular Disease | Admitting: Cardiovascular Disease

## 2022-06-27 ENCOUNTER — Encounter: Payer: Self-pay | Admitting: Cardiovascular Disease

## 2022-06-27 VITALS — BP 144/78 | HR 57 | Ht 65.0 in | Wt 169.8 lb

## 2022-06-27 DIAGNOSIS — R140 Abdominal distension (gaseous): Secondary | ICD-10-CM | POA: Diagnosis not present

## 2022-06-27 DIAGNOSIS — R931 Abnormal findings on diagnostic imaging of heart and coronary circulation: Secondary | ICD-10-CM | POA: Diagnosis not present

## 2022-06-27 DIAGNOSIS — Z01810 Encounter for preprocedural cardiovascular examination: Secondary | ICD-10-CM

## 2022-06-27 DIAGNOSIS — N1832 Chronic kidney disease, stage 3b: Secondary | ICD-10-CM | POA: Diagnosis not present

## 2022-06-27 NOTE — H&P (View-Only) (Signed)
Cardiology Office Note:    Date:  06/27/2022   ID:  Barry [name], DOB 23-Sep-1951, MRN [id number]  PCP:  Darreld Mclean, MD   [place] Prov[facility] Cardiologist:  Sherren Mocha, MD Cardiology APP:  Sharmon Revere     Referring MD: Darreld Mclean, MD   Chief Complaint  Patient presents with   Coronary Artery Disease    History of Present Illness:    Barry [name] is a 71 y.o. male with a hx of  coronary artery disease status post CABG (2022).  At that time he was treated with a LIMA to LAD, saphenous vein graft to PDA, saphenous vein graft first diagonal, and saphenous vein graft to obtuse marginal.  Comorbid conditions include hyperlipidemia, congestive heart failure with mildly reduced ejection fraction, chronic SMA dissection, and peripheral arterial disease with penetrating ulcer in the left common iliac artery, remote stroke in 2011, diabetes, chronic kidney disease, hypertension, and abdominal aortic aneurysm.  The patient was recently evaluated in the office when he came in with his son.  He complained of a feeling of "hotness" in his chest as well as marked fatigue and loss of energy associated with his chest discomfort. A stress test was done and the Myoview perfusion imaging demonstrated anterolateral ischemia as well as inferior apical infarct with peri-infarct ischemia.  The study was felt to be consistent with RCA and left circumflex territory ischemia.  The patient presents today to follow-up on this abnormal stress test and reassess his symptoms.  He now is complaining more of abdominal tightness and discomfort as well as nausea.  Sometimes the pain radiates to the chest.  He continues to have fatigue.  He experiences shortness of breath when his abdomen feels distended.  No edema, orthopnea, PND, or heart palpitations.  Past Medical History:  Diagnosis Date   Anginal pain ([place])    CAD (coronary artery disease)    LHC (02/26/14):  >> PCI:  Xience DES to pRCA  // LHC (03/10/14):  >> PCI:  Xience DES to [facility] // Myoview 2/16: low risk // Cath 2016: patent stents; stable dz - med Rx // Myoview 10/22: anterolateral ischemia, inferoapical infarct w peri infarct ischemia; EF 41; High Risk // Cath 12/22: LCx stent occl, severe LAD and Dx dz, diffuse RCA disease >> CABG   Carotid stenosis    a. Carotid US (02/27/14):  Bilateral ICA 1-39%   CKD (chronic kidney disease) stage 3    Depression    Diabetes mellitus    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[place])    a. Mesenteric Artery Duplex (7/15):  pSMA chronic dissection with aneurysmal dilation of 1.29 cm (VVS);  b.  Chest CTA (02/26/14):  IMPRESSION:  1. No aortic  dissection or other acute abnormality.  2. 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  3. Stable 17 mm ectasia of the left common iliac artery.  4. Atherosclerosis, including aortoiliac and coronary artery disease.    HFmrEF (heart failure with mildly reduced EF) 09/2015   Echo 6/15: EF 50-55 // Echo 1/17: mod LVH, EF 55%, inf-lat HK, Gr 1 DD, mild LAE // Echocardiogram 10/22: EF 40-45, Gr 1 DD, mildly reduced RVSF, RVSP 26.6, trivial MR, trivial AI, post and ant-lat HK   Hyperlipidemia    Hypertension    Iliac artery aneurysm, left (HCC)    Myocardial infarction ([place])    PVC's (premature ventricular contractions) 07/21/2021   Monitor 10/22:  NSR avg HR 71; no AF/Flutter; no high  grade HB or pathologic pauses PVC burden 20%, rare supraventricular beats w/o sustained arrhythmia    Stroke ([place]) 09/26/2009    Past Surgical History:  Procedure Laterality Date   CARDIAC CATHETERIZATION     CARDIAC CATHETERIZATION N/A 06/25/2015   Procedure: Left Heart Cath and Coronary Angiography;  Surgeon: Burnell Blanks, MD;  Location: [facility];  Service: Cardiovascular;  Laterality: N/A;   COLONOSCOPY WITH PROPOFOL N/A 07/23/2015   Procedure: COLONOSCOPY WITH PROPOFOL;  Surgeon: [name], MD;  Location: [facility];  Service: Endoscopy;   Laterality: N/A;   CORONARY ANGIOPLASTY     CORONARY ARTERY BYPASS GRAFT N/A 09/21/2021   Procedure: CORONARY ARTERY BYPASS GRAFTING (CABG)  X FOUR, ON PUMP, USING LEFT INTERNAL MAMMARY ARTERY AND RIGHT ENDOSCOPIC GREATER SAPHENOUS VEIN CONDUITS;  Surgeon: Lajuana Matte, MD;  Location: [place];  Service: Open Heart Surgery;  Laterality: N/A;   ENDOVEIN HARVEST OF GREATER SAPHENOUS VEIN  09/21/2021   Procedure: ENDOVEIN HARVEST OF GREATER SAPHENOUS VEIN;  Surgeon: Lajuana Matte, MD;  Location: [place];  Service: Open Heart Surgery;;   LEFT HEART CATH AND CORONARY ANGIOGRAPHY N/A 08/31/2021   Procedure: LEFT HEART CATH AND CORONARY ANGIOGRAPHY;  Surgeon: Belva Crome, MD;  Location: [facility];  Service: Cardiovascular;  Laterality: N/A;   LEFT HEART CATHETERIZATION WITH CORONARY ANGIOGRAM N/A 02/26/2014   Procedure: LEFT HEART CATHETERIZATION WITH CORONARY ANGIOGRAM;  Surgeon: Wellington Hampshire, MD;  Location: [facility];  Service: Cardiovascular;  Laterality: N/A;   LEFT HEART CATHETERIZATION WITH CORONARY ANGIOGRAM N/A 03/10/2014   Procedure: LEFT HEART CATHETERIZATION WITH CORONARY ANGIOGRAM;  Surgeon: Jettie Booze, MD;  Location: [facility];  Service: Cardiovascular;  Laterality: N/A;   TEE WITHOUT CARDIOVERSION N/A 09/21/2021   Procedure: TRANSESOPHAGEAL ECHOCARDIOGRAM (TEE);  Surgeon: Lajuana Matte, MD;  Location: [place];  Service: Open Heart Surgery;  Laterality: N/A;    Current Medications: Current Meds  Medication Sig   acetaminophen (TYLENOL) 325 MG tablet Take 2 tablets (650 mg total) by mouth every 6 (six) hours as needed for mild pain, fever or headache.   aspirin 81 MG EC tablet Take 1 tablet (81 mg total) by mouth daily.   blood glucose meter kit and supplies Dispense based on patient and insurance preference. Use up to four times daily as directed. (FOR ICD-10 E10.9, E11.9).   gabapentin (NEURONTIN) 300 MG capsule Take 1 capsule (300 mg total) by  mouth 2 (two) times daily.   glucose blood (ONETOUCH ULTRA) test strip USE  STRIP TO CHECK GLUCOSE TWICE DAILY   insulin glargine (LANTUS) 100 UNIT/ML injection Inject 0.22-0.27 mLs (22-27 Units total) into the skin daily.   Insulin Pen Needle (TECHLITE PEN NEEDLES) 32G X 8 MM MISC Use daily to administer insulin   Lancets (ONETOUCH DELICA PLUS [id number]) MISC Check blood sugars twice daily   lisinopril (ZESTRIL) 20 MG tablet Take 20 mg by mouth daily. Patient taking 1/2 tablet daily   metoprolol tartrate (LOPRESSOR) 25 MG tablet Take 1 tablet (25 mg total) by mouth 2 (two) times daily.   rosuvastatin (CRESTOR) 40 MG tablet Take 1 tablet (40 mg total) by mouth daily.     Allergies:   Metformin and related   Social History   Socioeconomic History   Marital status: Married    Spouse name: Not on file   Number of children: 3   Years of education: Not on file   Highest education level: Not  on file  Occupational History   Occupation: Retired  Tobacco Use   Smoking status: Former    Packs/day: 0.25    Years: 38.00    Total pack years: 9.50    Types: Cigarettes    Quit date: 2008    Years since quitting: 15.7   Smokeless tobacco: Never  Vaping Use   Vaping Use: Never used  Substance and Sexual Activity   Alcohol use: No    Alcohol/week: 0.0 standard drinks of alcohol   Drug use: No   Sexual activity: Yes  Other Topics Concern   Not on file  Social History Narrative   Marital: married.      Lives: with son,wife.       Children: 3 children; 6 grandchildren      Employed: unemployed; disability unknown reason/CVA L sided weakness      Tobacco:  Quit 2012; smoked 40 years.       Alcohol: no drinking now; social in past.       Drugs: none       Exercise: sporadic.       ADLs:  No driving since CVA.   Social Determinants of Health   Financial Resource Strain: Low Risk  (05/28/2021)   Overall Financial Resource Strain (CARDIA)    Difficulty of Paying Living Expenses: Not very  hard  Food Insecurity: Food Insecurity Present (11/12/2021)   Hunger Vital Sign    Worried About Running Out of Food in the Last Year: Sometimes true    Ran Out of Food in the Last Year: Never true  Transportation Needs: No Transportation Needs (06/07/2021)   PRAPARE - Hydrologist (Medical): No    Lack of Transportation (Non-Medical): No  Physical Activity: Inactive (11/12/2021)   Exercise Vital Sign    Days of Exercise per Week: 0 days    Minutes of Exercise per Session: 0 min  Stress: Not on file  Social Connections: Not on file     Family History: The patient's family history includes Diabetes in his father; Heart attack in his father; Hypertension in his father. There is no history of Stroke.  ROS:   Please see the history of present illness.    All other systems reviewed and are negative.  EKGs/Labs/Other Studies Reviewed:    The following studies were reviewed today: Stress Perfusion Study:   Findings are consistent with prior myocardial infarction with peri-infarct ischemia. The study is intermediate risk.   No ST deviation was noted.   Left ventricular function is normal. Nuclear stress EF: 56 %. The left ventricular ejection fraction is normal (55-65%). End diastolic cavity size is normal. End systolic cavity size is normal.   Prior study available for comparison from 07/15/2021. There are changes compared to prior study.   Wall motion consistent with abnormal septal motion post CABG. There are mild fixed perfusion defects throughout the inferior and lateral walls. However, there is worsening perfusion in only the anterolateral wall with stress, and the other areas improve perfusion slightly with stress. This suggest prior infarct with peri infarct ischemia in the anterolateral wall.   Cardiac Cath 08/31/2021: CONCLUSIONS: Severe mid LAD disease.  Severe first diagonal disease. Total occlusion of the proximal to mid circumflex after the first  obtuse marginal within a previously placed stent.  Circumflex has left to left collaterals. Right coronary contains a proximal stent with 40% ISR, distal RCA contains 60 to 70% stenosis, continuation of RCA contains segmental 50% stenosis, a  small third left ventricular branch contains 75% ostial to proximal stenosis. Diffuse 30% mid to distal left main narrowing Left ventricular dysfunction with anteroapical severe hypokinesis.  LVEF 40%.  LVEDP is normal.   RECOMMENDATION:   71 year old diabetic with total occlusion due to in-stent restenosis in the circumflex, severe disease in the proximal to mid LAD and first diagonal with moderate disease in the right coronary.  Given decreased LV function, coronary artery surgical revascularization is indicated.  We will arrange an appointment with the surgical team within the next week if possible.  EKG:  EKG is ordered today.  The ekg ordered today demonstrates sinus bradycardia 57 bpm, possible age-indeterminate inferior infarct, otherwise within normal limits.  Recent Labs: 09/22/2021: Magnesium 2.2 04/06/2022: ALT 10; BUN 22; Creatinine, Ser 1.76; Hemoglobin 14.5; Platelets 134.0; Potassium 4.5; Sodium 137; TSH 1.27  Recent Lipid Panel    Component Value Date/Time   CHOL 179 06/07/2021 1355   CHOL 92 (L) 05/11/2020 1020   TRIG 273.0 (H) 06/07/2021 1355   HDL 36.00 (L) 06/07/2021 1355   HDL 34 (L) 05/11/2020 1020   CHOLHDL 5 06/07/2021 1355   VLDL 54.6 (H) 06/07/2021 1355   LDLCALC 35 05/11/2020 1020   LDLDIRECT 53 10/12/2021 1033   LDLDIRECT 124.0 06/07/2021 1355     Risk Assessment/Calculations:           Physical Exam:    VS:  BP (!) 144/78   Pulse (!) 57   Ht _0  (1.651 m)   Wt 169 lb 12.8 oz (77 kg)   SpO2 96%   BMI 28.26 kg/m     Wt Readings from Last 3 Encounters:  06/27/22 169 lb 12.8 oz (77 kg)  06/03/22 171 lb (77.6 kg)  05/23/22 171 lb 9.6 oz (77.8 kg)     GEN:  Well nourished, well developed in no acute  distress HEENT: Normal NECK: No JVD; No carotid bruits LYMPHATICS: No lymphadenopathy CARDIAC: RRR, no murmurs, rubs, gallops RESPIRATORY:  Clear to auscultation without rales, wheezing or rhonchi  ABDOMEN: Soft, non-tender, distended MUSCULOSKELETAL:  No edema; No deformity  SKIN: Warm and dry NEUROLOGIC:  Alert and oriented x 3 PSYCHIATRIC:  Normal affect   ASSESSMENT:    1. Abnormal nuclear cardiac imaging test   2. Abdominal distention   3. Stage 3b chronic kidney disease ([place])   4. Pre-procedural cardiovascular examination    PLAN:    In order of problems listed above:  The patient recently underwent a stress Myoview scan demonstrating anterolateral ischemia and inferolateral and inferior infarct.  There may be a component of artifact over the fixed defects.  LVEF was preserved.  The patient has had marked fatigue and some atypical chest discomfort.  Due to the language barrier, even with use of an interpreter who is present today, his symptoms are difficult to elicit.  With his abnormal stress test and high risk of graft failure in the setting of diabetes, I think it is reasonable to proceed with cardiac catheterization.  Because of his abdominal distention and potential for congestive heart failure, I have recommended a right and left heart catheterization and graft angiography. I have reviewed the risks, indications, and alternatives to cardiac catheterization, possible angioplasty, and stenting with the patient. Risks include but are not limited to bleeding, infection, vascular injury, stroke, myocardial infection, arrhythmia, kidney injury, radiation-related injury in the case of prolonged fluoroscopy use, emergency cardiac surgery, and death. The patient understands the risks of serious complication is 1-2 in 0973  with diagnostic cardiac cath and 1-2% or less with angioplasty/stenting.   We will check an abdominal x-ray.  Fortunately he has no abdominal tenderness but he does seem  to be having a lot of symptoms of bloating and swelling. The patient will come in for preprocedure fluids 4 hours before the cardiac catheterization procedure per protocol.  We will assess his hemodynamics during the procedure.      Shared Decision Making/Informed Consent The risks [stroke (1 in 1000), death (1 in 1000), kidney failure [usually temporary] (1 in 500), bleeding (1 in 200), allergic reaction [possibly serious] (1 in 200)], benefits (diagnostic support and management of coronary artery disease) and alternatives of a cardiac catheterization were discussed in detail with Mr. Meir and he is willing to proceed.    Medication Adjustments/Labs and Tests Ordered: Current medicines are reviewed at length with the patient today.  Concerns regarding medicines are outlined above.  Orders Placed This Encounter  Procedures   DG Abd 1 View   CBC   Basic metabolic panel   EKG 74-BSWH   No orders of the defined types were placed in this encounter.   Patient Instructions  Medication Instructions:  Your physician recommends that you continue on your current medications as directed. Please refer to the Current Medication list given to you today.  *If you need a refill on your cardiac medications before your next appointment, please call your pharmacy*   Lab Work: CBC, BMET today If you have labs (blood work) drawn today and your tests are completely normal, you will receive your results only by: [place] (if you have MyChart) OR A paper copy in the mail If you have any lab test that is abnormal or we need to change your treatment, we will call you to review the results.   Testing/Procedures: R & L heart catheterization Your physician recommends that you continue on your current medications as directed. Please refer to the Current Medication list given to you today.  Abdominal X-ray ([facility])   Follow-Up: At [facility], you and your health needs are our priority.   As part of our continuing mission to provide you with exceptional heart care, we have created designated Provider Care Teams.  These Care Teams include your primary Cardiologist (physician) and Advanced Practice Providers (APPs -  Physician Assistants and Nurse Practitioners) who all work together to provide you with the care you need, when you need it.  Your next appointment:   6 week(s)  The format for your next appointment:   In Person  Provider:   Norva Karvonen, or [place]    Other Instructions       Cardiac/Peripheral Catheterization   You are scheduled for a Cardiac Catheterization on Friday, October 6 with Dr. Sherren Mocha.  1. Please arrive at the Main Entrance A at [facility]: [place], [place] [address] on October 6 at 7:30 AM (This time is two hours before your procedure to ensure your preparation). Free valet parking service is available. You will check in at ADMITTING. The support person will be asked to wait in the waiting room.  It is OK to have someone drop you off and come back when you are ready to be discharged.        Special note: Every effort is made to have your procedure done on time. Please understand that emergencies sometimes delay scheduled procedures.   . 2. Diet: Do not eat solid foods after midnight.  You  may have clear liquids until 5 AM the day of the procedure.  3. Labs: TODAY!  4. Medication instructions in preparation for your procedure:   Contrast Allergy: No  TAKE 1/2 dose of Lantus Insulin the night before procedure DO NOT TAKE Lisinopril the day before procedure or day of procedure On the morning of your procedure, take Aspirin 81 mg and any morning medicines NOT listed above.  You may use sips of water.  5. Plan to go home the same day, you will only stay overnight if medically necessary. 6. You MUST have a responsible adult to drive you home. 7. An adult MUST be with you the first 24 hours after you  arrive home. 8. Bring a current list of your medications, and the last time and date medication taken. 9. Bring ID and current insurance cards. 10.Please wear clothes that are easy to get on and off and wear slip-on shoes.  Thank you for allowing [place] to care for you!   -- [place] [facility]   Important Information About Sugar         Signed, Sherren Mocha, MD  06/27/2022 4:44 PM    [place]

## 2022-06-27 NOTE — Patient Instructions (Addendum)
Medication Instructions:  Your physician recommends that you continue on your current medications as directed. Please refer to the Current Medication list given to you today.  *If you need a refill on your cardiac medications before your next appointment, please call your pharmacy*   Lab Work: CBC, BMET today If you have labs (blood work) drawn today and your tests are completely normal, you will receive your results only by: [place] (if you have MyChart) OR A paper copy in the mail If you have any lab test that is abnormal or we need to change your treatment, we will call you to review the results.   Testing/Procedures: R & L heart catheterization Your physician recommends that you continue on your current medications as directed. Please refer to the Current Medication list given to you today.  Abdominal X-ray ([facility]UB)   Follow-Up: At [facility], you and your health needs are our priority.  As part of our continuing mission to provide you with exceptional heart care, we have created designated Provider Care Teams.  These Care Teams include your primary Cardiologist (physician) and Advanced Practice Providers (APPs -  Physician Assistants and Nurse Practitioners) who all work together to provide you with the care you need, when you need it.  Your next appointment:   6 week(s)  The format for your next appointment:   In Person  Provider:   Norva Karvonen, or [place]    Other Instructions       Cardiac/Peripheral Catheterization   You are scheduled for a Cardiac Catheterization on Friday, October 6 with Dr. Sherren Mocha.  1. Please arrive at the Main Entrance A at [facility]: [place], [place] [address] on October 6 at 7:30 AM (This time is two hours before your procedure to ensure your preparation). Free valet parking service is available. You will check in at ADMITTING. The support person will be asked to wait in the waiting  room.  It is OK to have someone drop you off and come back when you are ready to be discharged.        Special note: Every effort is made to have your procedure done on time. Please understand that emergencies sometimes delay scheduled procedures.   . 2. Diet: Do not eat solid foods after midnight.  You may have clear liquids until 5 AM the day of the procedure.  3. Labs: TODAY!  4. Medication instructions in preparation for your procedure:   Contrast Allergy: No  TAKE 1/2 dose of Lantus Insulin the night before procedure DO NOT TAKE Lisinopril the day before procedure or day of procedure On the morning of your procedure, take Aspirin 81 mg and any morning medicines NOT listed above.  You may use sips of water.  5. Plan to go home the same day, you will only stay overnight if medically necessary. 6. You MUST have a responsible adult to drive you home. 7. An adult MUST be with you the first 24 hours after you arrive home. 8. Bring a current list of your medications, and the last time and date medication taken. 9. Bring ID and current insurance cards. 10.Please wear clothes that are easy to get on and off and wear slip-on shoes.  Thank you for allowing [place] to care for you!   -- [place] [facility]   Important Information About Sugar

## 2022-06-27 NOTE — Progress Notes (Signed)
Cardiology Office Note:    Date:  06/27/2022   ID:  Barry [name], DOB 23-Sep-1951, MRN [id number]  PCP:  Darreld Mclean, MD   [place] Prov[facility] Cardiologist:  Sherren Mocha, MD Cardiology APP:  Sharmon Revere     Referring MD: Darreld Mclean, MD   Chief Complaint  Patient presents with   Coronary Artery Disease    History of Present Illness:    Barry [name] is a 71 y.o. male with a hx of  coronary artery disease status post CABG (2022).  At that time he was treated with a LIMA to LAD, saphenous vein graft to PDA, saphenous vein graft first diagonal, and saphenous vein graft to obtuse marginal.  Comorbid conditions include hyperlipidemia, congestive heart failure with mildly reduced ejection fraction, chronic SMA dissection, and peripheral arterial disease with penetrating ulcer in the left common iliac artery, remote stroke in 2011, diabetes, chronic kidney disease, hypertension, and abdominal aortic aneurysm.  The patient was recently evaluated in the office when he came in with his son.  He complained of a feeling of "hotness" in his chest as well as marked fatigue and loss of energy associated with his chest discomfort. A stress test was done and the Myoview perfusion imaging demonstrated anterolateral ischemia as well as inferior apical infarct with peri-infarct ischemia.  The study was felt to be consistent with RCA and left circumflex territory ischemia.  The patient presents today to follow-up on this abnormal stress test and reassess his symptoms.  He now is complaining more of abdominal tightness and discomfort as well as nausea.  Sometimes the pain radiates to the chest.  He continues to have fatigue.  He experiences shortness of breath when his abdomen feels distended.  No edema, orthopnea, PND, or heart palpitations.  Past Medical History:  Diagnosis Date   Anginal pain ([place])    CAD (coronary artery disease)    LHC (02/26/14):  >> PCI:  Xience DES to pRCA  // LHC (03/10/14):  >> PCI:  Xience DES to [facility] // Myoview 2/16: low risk // Cath 2016: patent stents; stable dz - med Rx // Myoview 10/22: anterolateral ischemia, inferoapical infarct w peri infarct ischemia; EF 41; High Risk // Cath 12/22: LCx stent occl, severe LAD and Dx dz, diffuse RCA disease >> CABG   Carotid stenosis    a. Carotid US (02/27/14):  Bilateral ICA 1-39%   CKD (chronic kidney disease) stage 3    Depression    Diabetes mellitus    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[place])    a. Mesenteric Artery Duplex (7/15):  pSMA chronic dissection with aneurysmal dilation of 1.29 cm (VVS);  b.  Chest CTA (02/26/14):  IMPRESSION:  1. No aortic  dissection or other acute abnormality.  2. 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  3. Stable 17 mm ectasia of the left common iliac artery.  4. Atherosclerosis, including aortoiliac and coronary artery disease.    HFmrEF (heart failure with mildly reduced EF) 09/2015   Echo 6/15: EF 50-55 // Echo 1/17: mod LVH, EF 55%, inf-lat HK, Gr 1 DD, mild LAE // Echocardiogram 10/22: EF 40-45, Gr 1 DD, mildly reduced RVSF, RVSP 26.6, trivial MR, trivial AI, post and ant-lat HK   Hyperlipidemia    Hypertension    Iliac artery aneurysm, left (HCC)    Myocardial infarction ([place])    PVC's (premature ventricular contractions) 07/21/2021   Monitor 10/22:  NSR avg HR 71; no AF/Flutter; no high  grade HB or pathologic pauses PVC burden 20%, rare supraventricular beats w/o sustained arrhythmia    Stroke ([place]) 09/26/2009    Past Surgical History:  Procedure Laterality Date   CARDIAC CATHETERIZATION     CARDIAC CATHETERIZATION N/A 06/25/2015   Procedure: Left Heart Cath and Coronary Angiography;  Surgeon: Burnell Blanks, MD;  Location: [facility];  Service: Cardiovascular;  Laterality: N/A;   COLONOSCOPY WITH PROPOFOL N/A 07/23/2015   Procedure: COLONOSCOPY WITH PROPOFOL;  Surgeon: [name], MD;  Location: [facility];  Service: Endoscopy;   Laterality: N/A;   CORONARY ANGIOPLASTY     CORONARY ARTERY BYPASS GRAFT N/A 09/21/2021   Procedure: CORONARY ARTERY BYPASS GRAFTING (CABG)  X FOUR, ON PUMP, USING LEFT INTERNAL MAMMARY ARTERY AND RIGHT ENDOSCOPIC GREATER SAPHENOUS VEIN CONDUITS;  Surgeon: Lajuana Matte, MD;  Location: [facility];  Service: Open Heart Surgery;  Laterality: N/A;   ENDOVEIN HARVEST OF GREATER SAPHENOUS VEIN  09/21/2021   Procedure: ENDOVEIN HARVEST OF GREATER SAPHENOUS VEIN;  Surgeon: Lajuana Matte, MD;  Location: [facility];  Service: Open Heart Surgery;;   LEFT HEART CATH AND CORONARY ANGIOGRAPHY N/A 08/31/2021   Procedure: LEFT HEART CATH AND CORONARY ANGIOGRAPHY;  Surgeon: Belva Crome, MD;  Location: [facility];  Service: Cardiovascular;  Laterality: N/A;   LEFT HEART CATHETERIZATION WITH CORONARY ANGIOGRAM N/A 02/26/2014   Procedure: LEFT HEART CATHETERIZATION WITH CORONARY ANGIOGRAM;  Surgeon: Wellington Hampshire, MD;  Location: [facility];  Service: Cardiovascular;  Laterality: N/A;   LEFT HEART CATHETERIZATION WITH CORONARY ANGIOGRAM N/A 03/10/2014   Procedure: LEFT HEART CATHETERIZATION WITH CORONARY ANGIOGRAM;  Surgeon: Jettie Booze, MD;  [facility]: [facility] C[facility] LAB;  Service: Cardiovascular;  Laterality: N/A;   TEE WITHOUT CARDIOVERSION N/A 09/21/2021   Procedure: TRANSESOPHAGEAL ECHOCARDIOGRAM (TEE);  Surgeon: Lajuana Matte, MD;  Location: [facility];  Service: Open Heart Surgery;  Laterality: N/A;    Current Medications: Current Meds  Medication Sig   acetaminophen (TYLENOL) 325 MG tablet Take 2 tablets (650 mg total) by mouth every 6 (six) hours as needed for mild pain, fever or headache.   aspirin 81 MG EC tablet Take 1 tablet (81 mg total) by mouth daily.   blood glucose meter kit and supplies Dispense based on patient and insurance preference. Use up to four times daily as directed. (FOR ICD-10 E10.9, E11.9).   gabapentin (NEURONTIN) 300 MG capsule Take 1 capsule (300 mg total) by  mouth 2 (two) times daily.   glucose blood (ONETOUCH ULTRA) test strip USE  STRIP TO CHECK GLUCOSE TWICE DAILY   insulin glargine (LANTUS) 100 UNIT/ML injection Inject 0.22-0.27 mLs (22-27 Units total) into the skin daily.   Insulin Pen Needle (TECHLITE PEN NEEDLES) 32G X 8 MM MISC Use daily to administer insulin   Lancets (ONETOUCH DELICA PLUS [id number]) MISC Check blood sugars twice daily   lisinopril (ZESTRIL) 20 MG tablet Take 20 mg by mouth daily. Patient taking 1/2 tablet daily   metoprolol tartrate (LOPRESSOR) 25 MG tablet Take 1 tablet (25 mg total) by mouth 2 (two) times daily.   rosuvastatin (CRESTOR) 40 MG tablet Take 1 tablet (40 mg total) by mouth daily.     Allergies:   Metformin and related   Social History   Socioeconomic History   Marital status: Married    Spouse name: Not on file   Number of children: 3   Years of education: Not on file   Highest education level: Not  on file  Occupational History   Occupation: Retired  Tobacco Use   Smoking status: Former    Packs/day: 0.25    Years: 38.00    Total pack years: 9.50    Types: Cigarettes    Quit date: 2008    Years since quitting: 15.7   Smokeless tobacco: Never  Vaping Use   Vaping Use: Never used  Substance and Sexual Activity   Alcohol use: No    Alcohol/week: 0.0 standard drinks of alcohol   Drug use: No   Sexual activity: Yes  Other Topics Concern   Not on file  Social History Narrative   Marital: married.      Lives: with son,wife.       Children: 3 children; 6 grandchildren      Employed: unemployed; disability unknown reason/CVA L sided weakness      Tobacco:  Quit 2012; smoked 40 years.       Alcohol: no drinking now; social in past.       Drugs: none       Exercise: sporadic.       ADLs:  No driving since CVA.   Social Determinants of Health   Financial Resource Strain: Low Risk  (05/28/2021)   Overall Financial Resource Strain (CARDIA)    Difficulty of Paying Living Expenses: Not very  hard  Food Insecurity: Food Insecurity Present (11/12/2021)   Hunger Vital Sign    Worried About Running Out of Food in the Last Year: Sometimes true    Ran Out of Food in the Last Year: Never true  Transportation Needs: No Transportation Needs (06/07/2021)   PRAPARE - Hydrologist (Medical): No    Lack of Transportation (Non-Medical): No  Physical Activity: Inactive (11/12/2021)   Exercise Vital Sign    Days of Exercise per Week: 0 days    Minutes of Exercise per Session: 0 min  Stress: Not on file  Social Connections: Not on file     Family History: The patient's family history includes Diabetes in his father; Heart attack in his father; Hypertension in his father. There is no history of Stroke.  ROS:   Please see the history of present illness.    All other systems reviewed and are negative.  EKGs/Labs/Other Studies Reviewed:    The following studies were reviewed today: Stress Perfusion Study:   Findings are consistent with prior myocardial infarction with peri-infarct ischemia. The study is intermediate risk.   No ST deviation was noted.   Left ventricular function is normal. Nuclear stress EF: 56 %. The left ventricular ejection fraction is normal (55-65%). End diastolic cavity size is normal. End systolic cavity size is normal.   Prior study available for comparison from 07/15/2021. There are changes compared to prior study.   Wall motion consistent with abnormal septal motion post CABG. There are mild fixed perfusion defects throughout the inferior and lateral walls. However, there is worsening perfusion in only the anterolateral wall with stress, and the other areas improve perfusion slightly with stress. This suggest prior infarct with peri infarct ischemia in the anterolateral wall.   Cardiac Cath 08/31/2021: CONCLUSIONS: Severe mid LAD disease.  Severe first diagonal disease. Total occlusion of the proximal to mid circumflex after the first  obtuse marginal within a previously placed stent.  Circumflex has left to left collaterals. Right coronary contains a proximal stent with 40% ISR, distal RCA contains 60 to 70% stenosis, continuation of RCA contains segmental 50% stenosis, a  small third left ventricular branch contains 75% ostial to proximal stenosis. Diffuse 30% mid to distal left main narrowing Left ventricular dysfunction with anteroapical severe hypokinesis.  LVEF 40%.  LVEDP is normal.   RECOMMENDATION:   71 year old diabetic with total occlusion due to in-stent restenosis in the circumflex, severe disease in the proximal to mid LAD and first diagonal with moderate disease in the right coronary.  Given decreased LV function, coronary artery surgical revascularization is indicated.  We will arrange an appointment with the surgical team within the next week if possible.  EKG:  EKG is ordered today.  The ekg ordered today demonstrates sinus bradycardia 57 bpm, possible age-indeterminate inferior infarct, otherwise within normal limits.  Recent Labs: 09/22/2021: Magnesium 2.2 04/06/2022: ALT 10; BUN 22; Creatinine, Ser 1.76; Hemoglobin 14.5; Platelets 134.0; Potassium 4.5; Sodium 137; TSH 1.27  Recent Lipid Panel    Component Value Date/Time   CHOL 179 06/07/2021 1355   CHOL 92 (L) 05/11/2020 1020   TRIG 273.0 (H) 06/07/2021 1355   HDL 36.00 (L) 06/07/2021 1355   HDL 34 (L) 05/11/2020 1020   CHOLHDL 5 06/07/2021 1355   VLDL 54.6 (H) 06/07/2021 1355   LDLCALC 35 05/11/2020 1020   LDLDIRECT 53 10/12/2021 1033   LDLDIRECT 124.0 06/07/2021 1355     Risk Assessment/Calculations:           Physical Exam:    VS:  BP (!) 144/78   Pulse (!) 57   Ht _0  (1.651 m)   Wt 169 lb 12.8 oz (77 kg)   SpO2 96%   BMI 28.26 kg/m     Wt Readings from Last 3 Encounters:  06/27/22 169 lb 12.8 oz (77 kg)  06/03/22 171 lb (77.6 kg)  05/23/22 171 lb 9.6 oz (77.8 kg)     GEN:  Well nourished, well developed in no acute  distress HEENT: Normal NECK: No JVD; No carotid bruits LYMPHATICS: No lymphadenopathy CARDIAC: RRR, no murmurs, rubs, gallops RESPIRATORY:  Clear to auscultation without rales, wheezing or rhonchi  ABDOMEN: Soft, non-tender, distended MUSCULOSKELETAL:  No edema; No deformity  SKIN: Warm and dry NEUROLOGIC:  Alert and oriented x 3 PSYCHIATRIC:  Normal affect   ASSESSMENT:    1. Abnormal nuclear cardiac imaging test   2. Abdominal distention   3. Stage 3b chronic kidney disease ([place])   4. Pre-procedural cardiovascular examination    PLAN:    In order of problems listed above:  The patient recently underwent a stress Myoview scan demonstrating anterolateral ischemia and inferolateral and inferior infarct.  There may be a component of artifact over the fixed defects.  LVEF was preserved.  The patient has had marked fatigue and some atypical chest discomfort.  Due to the language barrier, even with use of an interpreter who is present today, his symptoms are difficult to elicit.  With his abnormal stress test and high risk of graft failure in the setting of diabetes, I think it is reasonable to proceed with cardiac catheterization.  Because of his abdominal distention and potential for congestive heart failure, I have recommended a right and left heart catheterization and graft angiography. I have reviewed the risks, indications, and alternatives to cardiac catheterization, possible angioplasty, and stenting with the patient. Risks include but are not limited to bleeding, infection, vascular injury, stroke, myocardial infection, arrhythmia, kidney injury, radiation-related injury in the case of prolonged fluoroscopy use, emergency cardiac surgery, and death. The patient understands the risks of serious complication is 1-2 in 0973  with diagnostic cardiac cath and 1-2% or less with angioplasty/stenting.   We will check an abdominal x-ray.  Fortunately he has no abdominal tenderness but he does seem  to be having a lot of symptoms of bloating and swelling. The patient will come in for preprocedure fluids 4 hours before the cardiac catheterization procedure per protocol.  We will assess his hemodynamics during the procedure.      Shared Decision Making/Informed Consent The risks [stroke (1 in 1000), death (1 in 1000), kidney failure [usually temporary] (1 in 500), bleeding (1 in 200), allergic reaction [possibly serious] (1 in 200)], benefits (diagnostic support and management of coronary artery disease) and alternatives of a cardiac catheterization were discussed in detail with Barry [name] and he is willing to proceed.    Medication Adjustments/Labs and Tests Ordered: Current medicines are reviewed at length with the patient today.  Concerns regarding medicines are outlined above.  Orders Placed This Encounter  Procedures   DG Abd 1 View   CBC   Basic metabolic panel   EKG 74-BSWH   No orders of the defined types were placed in this encounter.   Patient Instructions  Medication Instructions:  Your physician recommends that you continue on your current medications as directed. Please refer to the Current Medication list given to you today.  *If you need a refill on your cardiac medications before your next appointment, please call your pharmacy*   Lab Work: CBC, BMET today If you have labs (blood work) drawn today and your tests are completely normal, you will receive your results only by: [place] (if you have MyChart) OR A paper copy in the mail If you have any lab test that is abnormal or we need to change your treatment, we will call you to review the results.   Testing/Procedures: R & L heart catheterization Your physician recommends that you continue on your current medications as directed. Please refer to the Current Medication list given to you today.  Abdominal X-ray ([facility])   Follow-Up: At [facility], you and your health needs are our priority.   As part of our continuing mission to provide you with exceptional heart care, we have created designated Provider Care Teams.  These Care Teams include your primary Cardiologist (physician) and Advanced Practice Providers (APPs -  Physician Assistants and Nurse Practitioners) who all work together to provide you with the care you need, when you need it.  Your next appointment:   6 week(s)  The format for your next appointment:   In Person  Provider:   Norva Karvonen, or [place]    Other Instructions       Cardiac/Peripheral Catheterization   You are scheduled for a Cardiac Catheterization on Friday, October 6 with Dr. Sherren Mocha.  1. Please arrive at the Main Entrance A at [facility]: [place], [place] [address] on October 6 at 7:30 AM (This time is two hours before your procedure to ensure your preparation). Free valet parking service is available. You will check in at ADMITTING. The support person will be asked to wait in the waiting room.  It is OK to have someone drop you off and come back when you are ready to be discharged.        Special note: Every effort is made to have your procedure done on time. Please understand that emergencies sometimes delay scheduled procedures.   . 2. Diet: Do not eat solid foods after midnight.  You  may have clear liquids until 5 AM the day of the procedure.  3. Labs: TODAY!  4. Medication instructions in preparation for your procedure:   Contrast Allergy: No  TAKE 1/2 dose of Lantus Insulin the night before procedure DO NOT TAKE Lisinopril the day before procedure or day of procedure On the morning of your procedure, take Aspirin 81 mg and any morning medicines NOT listed above.  You may use sips of water.  5. Plan to go home the same day, you will only stay overnight if medically necessary. 6. You MUST have a responsible adult to drive you home. 7. An adult MUST be with you the first 24 hours after you  arrive home. 8. Bring a current list of your medications, and the last time and date medication taken. 9. Bring ID and current insurance cards. 10.Please wear clothes that are easy to get on and off and wear slip-on shoes.  Thank you for allowing [place] to care for you!   -- [facility]   Important Information About Sugar         Signed, Sherren Mocha, MD  06/27/2022 4:44 PM    [place]

## 2022-06-28 ENCOUNTER — Ambulatory Visit (HOSPITAL_BASED_OUTPATIENT_CLINIC_OR_DEPARTMENT_OTHER)
Admission: RE | Admit: 2022-06-28 | Discharge: 2022-06-28 | Disposition: A | Discharge status: Home / Self Care | Payer: Medicare Other | Source: Ambulatory Visit | Attending: Cardiovascular Disease | Admitting: Cardiovascular Disease

## 2022-06-28 ENCOUNTER — Other Ambulatory Visit (HOSPITAL_BASED_OUTPATIENT_CLINIC_OR_DEPARTMENT_OTHER): Payer: Medicare Other

## 2022-06-28 DIAGNOSIS — R140 Abdominal distension (gaseous): Secondary | ICD-10-CM | POA: Insufficient documentation

## 2022-06-28 LAB — BASIC METABOLIC PANEL
BUN/Creatinine Ratio: 11 (ref 10–24)
BUN: 17 mg/dL (ref 8–27)
CO2: 25 mmol/L (ref 20–29)
Calcium: 9.9 mg/dL (ref 8.6–10.2)
Chloride: 101 mmol/L (ref 96–106)
Creatinine, Ser: 1.61 mg/dL — ABNORMAL HIGH (ref 0.76–1.27)
Glucose: 300 mg/dL — ABNORMAL HIGH (ref 70–99)
Potassium: 4.6 mmol/L (ref 3.5–5.2)
Sodium: 141 mmol/L (ref 134–144)
eGFR: 45 mL/min/{1.73_m2} — ABNORMAL LOW (ref >59)

## 2022-06-28 LAB — CBC
Hematocrit: 48.5 % (ref 37.5–51.0)
Hemoglobin: 15.4 g/dL (ref 13.0–17.7)
MCH: 27.5 pg (ref 26.6–33.0)
MCHC: 31.8 g/dL (ref 31.5–35.7)
MCV: 87 fL (ref 79–97)
Platelets: 142 10*3/uL — ABNORMAL LOW (ref 150–450)
RBC: 5.6 x10E6/uL (ref 4.14–5.80)
RDW: 13.1 % (ref 11.6–15.4)
WBC: 8.6 10*3/uL (ref 3.4–10.8)

## 2022-06-29 ENCOUNTER — Telehealth: Payer: Self-pay | Admitting: *Deleted

## 2022-06-29 NOTE — Telephone Encounter (Addendum)
Cardiac Catheterization scheduled at [facility] for: Friday July 01, 2022 12:30 PM Arrival time and place: [place] Entrance A at: 7:30 AM-pre-procedure hydration  Nothing to eat after midnight prior to procedure, clear liquids until 5 AM day of procedure.  Medication instructions: -Hold:  Insulin-AM of procedure  Lisinopril-day before and day of procedure-per protocol GFR 45 -Usual morning medications can be taken with sips of water including aspirin 81 mg.  Confirmed patient has responsible adult to drive home post procedure and be with patient first 24 hours after arriving home.  Patient reports no new symptoms concerning for COVID-19 in the past 10 days.  With assistance [employer] Interpreter ID [id number] placed to patient to review procedure instructions, no answer, voicemail not set up, unable to leave message.

## 2022-06-30 NOTE — Telephone Encounter (Signed)
Patient's son (speaks and understands [place]) Pros called to review procedure instructions for tomorrow. Reviewed procedure instructions with Pros, confirmed his phone number is [phone], confirmed pt is aware he cannot drive home, has transportation, and someone to be with him first 24 hours home.

## 2022-07-01 ENCOUNTER — Ambulatory Visit (HOSPITAL_COMMUNITY)
Admission: RE | Disposition: A | Discharge status: Home / Self Care | Payer: Self-pay | Source: Home / Self Care | Attending: Cardiovascular Disease

## 2022-07-01 ENCOUNTER — Ambulatory Visit (HOSPITAL_COMMUNITY)
Admission: RE | Admit: 2022-07-01 | Discharge: 2022-07-01 | Disposition: A | Discharge status: Home / Self Care | Payer: Medicare Other | Attending: Cardiovascular Disease | Admitting: Cardiovascular Disease

## 2022-07-01 ENCOUNTER — Other Ambulatory Visit: Payer: Self-pay

## 2022-07-01 DIAGNOSIS — I25810 Atherosclerosis of coronary artery bypass graft(s) without angina pectoris: Secondary | ICD-10-CM | POA: Insufficient documentation

## 2022-07-01 DIAGNOSIS — T82855A Stenosis of coronary artery stent, initial encounter: Secondary | ICD-10-CM | POA: Diagnosis not present

## 2022-07-01 DIAGNOSIS — R931 Abnormal findings on diagnostic imaging of heart and coronary circulation: Secondary | ICD-10-CM

## 2022-07-01 DIAGNOSIS — I2582 Chronic total occlusion of coronary artery: Secondary | ICD-10-CM | POA: Diagnosis not present

## 2022-07-01 DIAGNOSIS — Y832 Surgical operation with anastomosis, bypass or graft as the cause of abnormal reaction of the patient, or of later complication, without mention of misadventure at the time of the procedure: Secondary | ICD-10-CM | POA: Insufficient documentation

## 2022-07-01 DIAGNOSIS — I2510 Atherosclerotic heart disease of native coronary artery without angina pectoris: Secondary | ICD-10-CM

## 2022-07-01 DIAGNOSIS — Z955 Presence of coronary angioplasty implant and graft: Secondary | ICD-10-CM | POA: Diagnosis not present

## 2022-07-01 HISTORY — PX: RIGHT HEART CATH AND CORONARY/GRAFT ANGIOGRAPHY: CATH118265

## 2022-07-01 LAB — POCT I-STAT 7, (LYTES, BLD GAS, ICA,H+H)
Acid-Base Excess: 0 mmol/L (ref 0.0–2.0)
Bicarbonate: 26.1 mmol/L (ref 20.0–28.0)
Calcium, Ion: 1.23 mmol/L (ref 1.15–1.40)
HCT: 44 % (ref 39.0–52.0)
Hemoglobin: 15 g/dL (ref 13.0–17.0)
O2 Saturation: 93 %
Potassium: 3.7 mmol/L (ref 3.5–5.1)
Sodium: 140 mmol/L (ref 135–145)
TCO2: 27 mmol/L (ref 22–32)
pCO2 arterial: 44.7 mmHg (ref 32–48)
pH, Arterial: 7.374 (ref 7.35–7.45)
pO2, Arterial: 70 mmHg — ABNORMAL LOW (ref 83–108)

## 2022-07-01 LAB — POCT I-STAT EG7
Acid-base deficit: 1 mmol/L (ref 0.0–2.0)
Bicarbonate: 25.1 mmol/L (ref 20.0–28.0)
Calcium, Ion: 1.04 mmol/L — ABNORMAL LOW (ref 1.15–1.40)
HCT: 42 % (ref 39.0–52.0)
Hemoglobin: 14.3 g/dL (ref 13.0–17.0)
O2 Saturation: 71 %
Potassium: 3.3 mmol/L — ABNORMAL LOW (ref 3.5–5.1)
Sodium: 142 mmol/L (ref 135–145)
TCO2: 27 mmol/L (ref 22–32)
pCO2, Ven: 45.3 mmHg (ref 44–60)
pH, Ven: 7.352 (ref 7.25–7.43)
pO2, Ven: 39 mmHg (ref 32–45)

## 2022-07-01 LAB — GLUCOSE, CAPILLARY: Glucose-Capillary: 148 mg/dL — ABNORMAL HIGH (ref 70–99)

## 2022-07-01 SURGERY — RIGHT HEART CATH AND CORONARY/GRAFT ANGIOGRAPHY
Anesthesia: LOCAL

## 2022-07-01 MED ORDER — HEPARIN SODIUM (PORCINE) 1000 UNIT/ML IJ SOLN
INTRAMUSCULAR | Status: AC
  Filled 2022-07-01: qty 10

## 2022-07-01 MED ORDER — SODIUM CHLORIDE 0.9 % WEIGHT BASED INFUSION
3.0000 mL/kg/h | INTRAVENOUS | Status: AC
  Administered 2022-07-01: 3 mL/kg/h via INTRAVENOUS

## 2022-07-01 MED ORDER — SODIUM CHLORIDE 0.9% FLUSH: 3.0000 mL | Freq: Two times a day (BID) | INTRAVENOUS | Status: DC

## 2022-07-01 MED ORDER — SODIUM CHLORIDE 0.9 % IV SOLN: 250.0000 mL | INTRAVENOUS | Status: DC | PRN

## 2022-07-01 MED ORDER — HEPARIN (PORCINE) IN NACL 1000-0.9 UT/500ML-% IV SOLN
INTRAVENOUS | Status: AC
  Filled 2022-07-01: qty 1000

## 2022-07-01 MED ORDER — MIDAZOLAM HCL 2 MG/2ML IJ SOLN
INTRAMUSCULAR | Status: AC
  Filled 2022-07-01: qty 2

## 2022-07-01 MED ORDER — SODIUM CHLORIDE 0.9% FLUSH: 3.0000 mL | INTRAVENOUS | Status: DC | PRN

## 2022-07-01 MED ORDER — FENTANYL CITRATE (PF) 100 MCG/2ML IJ SOLN
INTRAMUSCULAR | Status: AC
  Filled 2022-07-01: qty 2

## 2022-07-01 MED ORDER — SODIUM CHLORIDE 0.9 % WEIGHT BASED INFUSION: 1.0000 mL/kg/h | INTRAVENOUS | Status: DC

## 2022-07-01 MED ORDER — LIDOCAINE HCL (PF) 1 % IJ SOLN
INTRAMUSCULAR | Status: AC
  Filled 2022-07-01: qty 30

## 2022-07-01 MED ORDER — LIDOCAINE HCL (PF) 1 % IJ SOLN
INTRAMUSCULAR | Status: DC | PRN
  Administered 2022-07-01 (×2): 2 mL via INTRADERMAL

## 2022-07-01 MED ORDER — ONDANSETRON HCL 4 MG/2ML IJ SOLN: 4.0000 mg | Freq: Four times a day (QID) | INTRAMUSCULAR | Status: DC | PRN

## 2022-07-01 MED ORDER — HYDRALAZINE HCL 20 MG/ML IJ SOLN: 10.0000 mg | INTRAMUSCULAR | Status: DC | PRN

## 2022-07-01 MED ORDER — ACETAMINOPHEN 325 MG PO TABS: 650.0000 mg | ORAL_TABLET | ORAL | Status: DC | PRN

## 2022-07-01 MED ORDER — VERAPAMIL HCL 2.5 MG/ML IV SOLN
INTRAVENOUS | Status: AC
  Filled 2022-07-01: qty 2

## 2022-07-01 MED ORDER — ASPIRIN 81 MG PO CHEW
81.0000 mg | CHEWABLE_TABLET | ORAL | Status: AC
  Administered 2022-07-01: 81 mg via ORAL

## 2022-07-01 MED ORDER — MIDAZOLAM HCL 2 MG/2ML IJ SOLN
INTRAMUSCULAR | Status: DC | PRN
  Administered 2022-07-01: 1 mg via INTRAVENOUS

## 2022-07-01 MED ORDER — LABETALOL HCL 5 MG/ML IV SOLN: 10.0000 mg | INTRAVENOUS | Status: DC | PRN

## 2022-07-01 MED ORDER — FENTANYL CITRATE (PF) 100 MCG/2ML IJ SOLN
INTRAMUSCULAR | Status: DC | PRN
  Administered 2022-07-01: 25 ug via INTRAVENOUS

## 2022-07-01 MED ORDER — HEPARIN SODIUM (PORCINE) 1000 UNIT/ML IJ SOLN
INTRAMUSCULAR | Status: DC | PRN
  Administered 2022-07-01: 4000 [IU] via INTRAVENOUS

## 2022-07-01 SURGICAL SUPPLY — 16 items
CATH BALLN WEDGE 5F 110CM (CATHETERS) IMPLANT
CATH EXPO 5F MPA-1 (CATHETERS) IMPLANT
CATH INFINITI 5 FR AL2 (CATHETERS) IMPLANT
CATH INFINITI 5FR AL1 (CATHETERS) IMPLANT
CATH INFINITI 5FR MULTPACK ANG (CATHETERS) IMPLANT
CATH LAUNCHER 5F EBU4.0 (CATHETERS) IMPLANT
DEVICE RAD COMP TR BAND LRG (VASCULAR PRODUCTS) IMPLANT
GLIDESHEATH SLEND SS 6F .021 (SHEATH) IMPLANT
GUIDEWIRE INQWIRE 1.5J.035X260 (WIRE) IMPLANT
INQWIRE 1.5J .035X260CM (WIRE) ×1
KIT HEART LEFT (KITS) ×1 IMPLANT
PACK CARDIAC CATHETERIZATION (CUSTOM PROCEDURE TRAY) ×1 IMPLANT
SHEATH GLIDE SLENDER 4/5FR (SHEATH) IMPLANT
SYR MEDRAD MARK 7 150ML (SYRINGE) ×1 IMPLANT
TRANSDUCER W/STOPCOCK (MISCELLANEOUS) ×1 IMPLANT
TUBING CIL FLEX 10 FLL-RA (TUBING) ×1 IMPLANT

## 2022-07-01 NOTE — Interval H&P Note (Signed)
History and Physical Interval Note:  07/01/2022 1:46 PM  Barry [name]  has presented today for surgery, with the diagnosis of abnormal stress test.  The various methods of treatment have been discussed with the patient and family. After consideration of risks, benefits and other options for treatment, the patient has consented to  Procedure(s): RIGHT/LEFT HEART CATH AND CORONARY/GRAFT ANGIOGRAPHY (N/A) as a surgical intervention.  The patient's history has been reviewed, patient examined, no change in status, stable for surgery.  I have reviewed the patient's chart and labs.  Questions were answered to the patient's satisfaction.     Sherren Mocha

## 2022-07-01 NOTE — Progress Notes (Signed)
Patient and wife was given discharge instructions through an interpreter. Both verbalized understanding.

## 2022-07-01 NOTE — Progress Notes (Signed)
TR BAND REMOVAL  LOCATION:    left radial  DEFLATED PER PROTOCOL:    Yes.    TIME BAND OFF / DRESSING APPLIED:    1645   SITE UPON ARRIVAL:    Level 0  SITE AFTER BAND REMOVAL:    Level 0  CIRCULATION SENSATION AND MOVEMENT:    Within Normal Limits   Yes.    COMMENTS:   No bleeding noted

## 2022-07-01 NOTE — Progress Notes (Addendum)
Interpreter Ty B[place] [phone]) from Communication [employer] present  and interpreted consent and assessment questions. Patients wife ([place]) present also with limited understanding.

## 2022-07-01 NOTE — Discharge Instructions (Signed)

## 2022-07-03 MED FILL — Heparin Sod (Porcine)-NaCl IV Soln 1000 Unit/500ML-0.9%: INTRAVENOUS | Qty: 1000 | Status: AC

## 2022-07-04 ENCOUNTER — Encounter (HOSPITAL_COMMUNITY): Payer: Self-pay | Admitting: Cardiovascular Disease

## 2022-07-14 ENCOUNTER — Other Ambulatory Visit: Payer: Self-pay | Admitting: Family Medicine

## 2022-07-14 DIAGNOSIS — H6061 Unspecified chronic otitis externa, right ear: Secondary | ICD-10-CM

## 2022-07-20 ENCOUNTER — Other Ambulatory Visit: Payer: Self-pay | Admitting: Family Medicine

## 2022-07-20 ENCOUNTER — Telehealth: Payer: Self-pay | Admitting: Family Medicine

## 2022-07-20 ENCOUNTER — Other Ambulatory Visit: Payer: Self-pay

## 2022-07-20 DIAGNOSIS — H6061 Unspecified chronic otitis externa, right ear: Secondary | ICD-10-CM

## 2022-07-20 MED ORDER — ONETOUCH DELICA PLUS LANCET33G MISC: 12 refills | Status: DC

## 2022-07-20 NOTE — Telephone Encounter (Signed)
Refill sent.

## 2022-07-20 NOTE — Telephone Encounter (Signed)
Medication: Lancets (ONETOUCH DELICA PLUS [id number]) MISC [   Has the patient contacted their pharmacy? No.   Preferred Pharmacy (with phone number or street name): [facility] ([address]), [place] [address] ([place]) [place] [address] Phone: [phone]  Fax: [phone]

## 2022-07-24 NOTE — Progress Notes (Addendum)
[place] [facility] [address] [place], [place] [address] [phone]  Date:  07/28/2022   Name:  Barry [name]   DOB:  1950-12-12   MRN:  [id number]  PCP:  Darreld Mclean, MD    Chief Complaint: Ear Pain (Right ear x "a long time" and some fatigue, some cough. Some body aches. )   History of Present Illness:  Barry [name] is a 71 y.o. very pleasant male patient who presents with the following:  Pt seen today with concern of ear pain- his right ear.  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, PAD, stroke in 2011, diabetes, hypertension, hyperlipidemia, CKD-his care is also complicated by language barrier and some medical noncompliance/ confusion about medication  He was recently admitted for a cardiac cath with Dr Burt Knack   Eye exam Shingrix Urine micro- give today  Covid booster  Flu shot- give today  Foot exam - done today  Lab Results  Component Value Date   HGBA1C 9.5 (H) 04/06/2022     Patient Active Problem List   Diagnosis Date Noted   Abnormal nuclear cardiac imaging test    S/P CABG x 4 09/21/2021   Abnormal nuclear stress test 08/31/2021   PVC's (premature ventricular contractions) 07/21/2021   CKD (chronic kidney disease) stage 3, GFR 30-59 ml/min ([place]) 07/21/2021   HFmrEF (heart failure with mildly reduced EF) 09/2015   Atherosclerotic peripheral vascular disease with ulceration ([place]) 04/03/2015   Aneurysm of iliac artery ([place]) 03/18/2014   Abdominal pain, unspecified site 03/18/2014   Syncope in setting of nausea and vomiting 03/07/14 03/08/2014   Coronary artery disease 02/28/2014   Non compliance with medical treatment 02/28/2014   Dyslipidemia, goal LDL  below 70 02/28/2014   Bradycardia- beta blocker decreased 02/28/2014   Chronic diastolic CHF (congestive heart failure) ([place]) 02/28/2014   Type 2 diabetes mellitus with manifestations ([place]) 02/27/2014   TIA (transient ischemic attack) 02/26/2014   History of non-ST elevation myocardial infarction (NSTEMI) 02/26/2014   Weakness-Left arm / Lef 12/06/2013   Mesenteric artery stenosis ([place]) 12/06/2013   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[place]) 12/03/2012   Hypertension 11/10/2011   History of CVA in adulthood 11/10/2011   Language barrier 11/10/2011    Past Medical History:  Diagnosis Date   Anginal pain ([place])    CAD (coronary artery disease)    LHC (02/26/14):  >> PCI:  Xience DES to pRCA // LHC (03/10/14):  >> PCI:  Xience DES to [facility] // Myoview 2/16: low risk // Cath 2016: patent stents; stable dz - med Rx // Myoview 10/22: anterolateral ischemia, inferoapical infarct w peri infarct ischemia; EF 46; High Risk // Cath 12/22: LCx stent occl, severe LAD and Dx dz, diffuse RCA disease >> CABG   Carotid stenosis    a. Carotid US (02/27/14):  Bilateral ICA 1-39%   CKD (chronic kidney disease) stage 3    Depression    Diabetes mellitus    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[place])    a. Mesenteric Artery Duplex (7/15):  pSMA chronic dissection with aneurysmal dilation of 1.29 cm (VVS);  b.  Chest CTA (02/26/14):  IMPRESSION:  1. No aortic  dissection or other acute  abnormality.  2. 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  3. Stable 17 mm ectasia of the left common iliac artery.  4. Atherosclerosis, including aortoiliac and coronary artery disease.    HFmrEF (heart failure with mildly reduced EF) 09/2015   Echo 6/15: EF 50-55 // Echo 1/17: mod LVH, EF 55%, inf-lat HK, Gr 1 DD, mild LAE // Echocardiogram 10/22: EF 40-45, Gr 1 DD, mildly reduced RVSF, RVSP 26.6, trivial MR, trivial AI, post and ant-lat HK   Hyperlipidemia    Hypertension    Iliac artery aneurysm, left (HCC)    Myocardial infarction  (HCC)    PVC's (premature ventricular contractions) 07/21/2021   Monitor 10/22:  NSR avg HR 71; no AF/Flutter; no high grade HB or pathologic pauses PVC burden 20%, rare supraventricular beats w/o sustained arrhythmia    Stroke ([place]) 09/26/2009    Past Surgical History:  Procedure Laterality Date   CARDIAC CATHETERIZATION     CARDIAC CATHETERIZATION N/A 06/25/2015   Procedure: Left Heart Cath and Coronary Angiography;  Surgeon: Burnell Blanks, MD;  Location: [place] CV [facility];  Service: Cardiovascular;  Laterality: N/A;   COLONOSCOPY WITH PROPOFOL N/A 07/23/2015   Procedure: COLONOSCOPY WITH PROPOFOL;  Surgeon: [name], MD;  Location: [facility];  Service: Endoscopy;  Laterality: N/A;   CORONARY ANGIOPLASTY     CORONARY ARTERY BYPASS GRAFT N/A 09/21/2021   Procedure: CORONARY ARTERY BYPASS GRAFTING (CABG)  X FOUR, ON PUMP, USING LEFT INTERNAL MAMMARY ARTERY AND RIGHT ENDOSCOPIC GREATER SAPHENOUS VEIN CONDUITS;  Surgeon: Lajuana Matte, MD;  Location: [facility];  Service: Open Heart Surgery;  Laterality: N/A;   ENDOVEIN HARVEST OF GREATER SAPHENOUS VEIN  09/21/2021   Procedure: ENDOVEIN HARVEST OF GREATER SAPHENOUS VEIN;  Surgeon: Lajuana Matte, MD;  Location: [place];  Service: Open Heart Surgery;;   LEFT HEART CATH AND CORONARY ANGIOGRAPHY N/A 08/31/2021   Procedure: LEFT HEART CATH AND CORONARY ANGIOGRAPHY;  Surgeon: Belva Crome, MD;  Location: [place] [facility];  Service: Cardiovascular;  Laterality: N/A;   LEFT HEART CATHETERIZATION WITH CORONARY ANGIOGRAM N/A 02/26/2014   Procedure: LEFT HEART CATHETERIZATION WITH CORONARY ANGIOGRAM;  Surgeon: Wellington Hampshire, MD;  Location: [place] CATH LAB;  Service: Cardiovascular;  Laterality: N/A;   LEFT HEART CATHETERIZATION WITH CORONARY ANGIOGRAM N/A 03/10/2014   Procedure: LEFT HEART CATHETERIZATION WITH CORONARY ANGIOGRAM;  Surgeon: Jettie Booze, MD;  Location: [facility] C[facility] LAB;  Service: Cardiovascular;  Laterality: N/A;    RIGHT HEART CATH AND CORONARY/GRAFT ANGIOGRAPHY N/A 07/01/2022   Procedure: RIGHT HEART CATH AND CORONARY/GRAFT ANGIOGRAPHY;  Surgeon: Sherren Mocha, MD;  Location: [place] CV LAB;  Service: Cardiovascular;  Laterality: N/A;   TEE WITHOUT CARDIOVERSION N/A 09/21/2021   Procedure: TRANSESOPHAGEAL ECHOCARDIOGRAM (TEE);  Surgeon: Lajuana Matte, MD;  Location: [place];  Service: Open Heart Surgery;  Laterality: N/A;    Social History   Tobacco Use   Smoking status: Former    Packs/day: 0.25    Years: 38.00    Total pack years: 9.50    Types: Cigarettes    Quit date: 2008    Years since quitting: 15.8   Smokeless tobacco: Never  Vaping Use   Vaping Use: Never used  Substance Use Topics   Alcohol use: No    Alcohol/week: 0.0 standard drinks of alcohol   Drug use: No    Family History  Problem Relation Age of Onset   Diabetes Father    Hypertension Father  Heart attack Father    Stroke Neg Hx     Allergies  Allergen Reactions   Metformin And Related     Pt feels that this causes constipation and will not take     Medication list has been reviewed and updated.  Current Outpatient Medications on File Prior to Visit  Medication Sig Dispense Refill   acetaminophen (TYLENOL) 325 MG tablet Take 2 tablets (650 mg total) by mouth every 6 (six) hours as needed for mild pain, fever or headache.     aspirin 81 MG EC tablet Take 1 tablet (81 mg total) by mouth daily.     blood glucose meter kit and supplies Dispense based on patient and insurance preference. Use up to four times daily as directed. (FOR ICD-10 E10.9, E11.9). 1 each 0   glucose blood (ONETOUCH ULTRA) test strip USE  STRIP TO CHECK GLUCOSE TWICE DAILY 200 each 12   Insulin Pen Needle (TECHLITE PEN NEEDLES) 32G X 8 MM MISC Use daily to administer insulin 100 each 3   Lancets (ONETOUCH DELICA PLUS [id number]) MISC Check blood sugars twice daily 200 each 12   LANTUS 100 UNIT/ML injection INJECT 22 TO 27 UNITS  INTO THE SKIN ONCE DAILY 10 mL 1   lisinopril (ZESTRIL) 20 MG tablet Take 20 mg by mouth daily. Patient taking 1/2 tablet daily     metoprolol tartrate (LOPRESSOR) 25 MG tablet Take 1 tablet (25 mg total) by mouth 2 (two) times daily. 180 tablet 3   rosuvastatin (CRESTOR) 40 MG tablet Take 1 tablet (40 mg total) by mouth daily. 90 tablet 3   No current facility-administered medications on file prior to visit.    Review of Systems:  As per HPI- otherwise negative.   Physical Examination: Vitals:   07/28/22 1122  BP: 122/80  Pulse: 60  Resp: 18  Temp: 97.8 F (36.6 C)  SpO2: 97%   Vitals:   07/28/22 1122  Weight: 171 lb 6.4 oz (77.7 kg)  Height: _0  (1.651 m)   Body mass index is 28.52 kg/m. Ideal Body Weight: Weight in (lb) to have BMI = 25: 149.9  GEN: no acute distress. Mild overweight, looks well HEENT: Atraumatic, Normocephalic.  Ears and Nose: No external deformity. CV: RRR, No M/G/R. No JVD. No thrill. No extra heart sounds. PULM: CTA B, no wheezes, crackles, rhonchi. No retractions. No resp. distress. No accessory muscle use. ABD: S, NT, ND, +BS. No rebound. No HSM. EXTR: No c/c/e PSYCH: Normally interactive. Conversant.  Normal foot exam today  Right ear: old injury to TM  Assessment and Plan: Type 2 diabetes mellitus with complication, without long-term current use of insulin (HCC) - Plan: Comprehensive metabolic panel, Hemoglobin A1c, Microalbumin / creatinine urine ratio  Dyslipidemia, goal LDL below 70  Essential hypertension - Plan: CBC, Comprehensive metabolic panel  Language barrier  Numbness and tingling in both hands - Plan: gabapentin (NEURONTIN) 300 MG capsule  Numbness and tingling of both feet - Plan: gabapentin (NEURONTIN) 300 MG capsule  S/P CABG x 4 - Plan: nitroGLYCERIN (NITROSTAT) 0.4 MG SL tablet  Right ear pain - Plan: ofloxacin (FLOXIN OTIC) 0.3 % OTIC solution  Screening for prostate cancer - Plan: PSA  Need for influenza  vaccination - Plan: Flu Vaccine QUAD High Dose(Fluad)  Following up today Refilled medications as requested Flu shot today Floxin otic drops refilled Will plan further follow- up pending labs.   Signed Lamar Blinks, MD 11/3 Received labs as below- letter to patient  He may benefit from an SGLT2-we will add Farxiga 5 mg Called on 11/6-no answer, left detailed message We will also send a letter Results for orders placed or performed in visit on 07/28/22  CBC  Result Value Ref Range   WBC 8.3 4.0 - 10.5 K/uL   RBC 5.56 4.22 - 5.81 Mil/uL   Platelets 150.0 150.0 - 400.0 K/uL   Hemoglobin 15.7 13.0 - 17.0 g/dL   HCT 48.1 39.0 - 52.0 %   MCV 86.4 78.0 - 100.0 fl   MCHC 32.7 30.0 - 36.0 g/dL   RDW 14.0 11.5 - 15.5 %  Comprehensive metabolic panel  Result Value Ref Range   Sodium 141 135 - 145 mEq/L   Potassium 4.4 3.5 - 5.1 mEq/L   Chloride 103 96 - 112 mEq/L   CO2 32 19 - 32 mEq/L   Glucose, Bld 113 (H) 70 - 99 mg/dL   BUN 18 6 - 23 mg/dL   Creatinine, Ser 1.42 0.40 - 1.50 mg/dL   Total Bilirubin 0.6 0.2 - 1.2 mg/dL   Alkaline Phosphatase 66 39 - 117 U/L   AST 19 0 - 37 U/L   ALT 14 0 - 53 U/L   Total Protein 7.3 6.0 - 8.3 g/dL   Albumin 4.5 3.5 - 5.2 g/dL   GFR 49.37 (L) >60.00 mL/min   Calcium 9.8 8.4 - 10.5 mg/dL  Hemoglobin A1c  Result Value Ref Range   Hgb A1c MFr Bld 8.8 (H) 4.6 - 6.5 %  Microalbumin / creatinine urine ratio  Result Value Ref Range   Microalb, Ur 7.7 (H) 0.0 - 1.9 mg/dL   Creatinine,U 124.5 mg/dL   Microalb Creat Ratio 6.2 0.0 - 30.0 mg/g  PSA  Result Value Ref Range   PSA 0.44 0.10 - 4.00 ng/mL   Lab Results  Component Value Date   PSA 0.44 07/28/2022   PSA 0.71 06/07/2021   PSA 0.81 07/23/2020

## 2022-07-24 NOTE — Patient Instructions (Incomplete)
Good to see you today I recommend getting the new covid vaccine and shingles, RSV this fall  Flu shot today I will be in touch with your labs asap

## 2022-07-28 ENCOUNTER — Ambulatory Visit (INDEPENDENT_AMBULATORY_CARE_PROVIDER_SITE_OTHER): Payer: Medicare Other | Admitting: Family Medicine

## 2022-07-28 VITALS — BP 122/80 | HR 60 | Temp 97.8°F | Resp 18 | Ht 65.0 in | Wt 171.4 lb

## 2022-07-28 DIAGNOSIS — I10 Essential (primary) hypertension: Secondary | ICD-10-CM | POA: Diagnosis not present

## 2022-07-28 DIAGNOSIS — R200 Anesthesia of skin: Secondary | ICD-10-CM

## 2022-07-28 DIAGNOSIS — R202 Paresthesia of skin: Secondary | ICD-10-CM | POA: Diagnosis not present

## 2022-07-28 DIAGNOSIS — Z789 Other specified health status: Secondary | ICD-10-CM

## 2022-07-28 DIAGNOSIS — Z23 Encounter for immunization: Secondary | ICD-10-CM | POA: Diagnosis not present

## 2022-07-28 DIAGNOSIS — E118 Type 2 diabetes mellitus with unspecified complications: Secondary | ICD-10-CM | POA: Diagnosis not present

## 2022-07-28 DIAGNOSIS — H9201 Otalgia, right ear: Secondary | ICD-10-CM

## 2022-07-28 DIAGNOSIS — Z951 Presence of aortocoronary bypass graft: Secondary | ICD-10-CM | POA: Diagnosis not present

## 2022-07-28 DIAGNOSIS — Z125 Encounter for screening for malignant neoplasm of prostate: Secondary | ICD-10-CM

## 2022-07-28 DIAGNOSIS — E785 Hyperlipidemia, unspecified: Secondary | ICD-10-CM | POA: Diagnosis not present

## 2022-07-28 LAB — CBC
HCT: 48.1 % (ref 39.0–52.0)
Hemoglobin: 15.7 g/dL (ref 13.0–17.0)
MCHC: 32.7 g/dL (ref 30.0–36.0)
MCV: 86.4 fl (ref 78.0–100.0)
Platelets: 150 10*3/uL (ref 150.0–400.0)
RBC: 5.56 Mil/uL (ref 4.22–5.81)
RDW: 14 % (ref 11.5–15.5)
WBC: 8.3 10*3/uL (ref 4.0–10.5)

## 2022-07-28 LAB — COMPREHENSIVE METABOLIC PANEL
ALT: 14 U/L (ref 0–53)
AST: 19 U/L (ref 0–37)
Albumin: 4.5 g/dL (ref 3.5–5.2)
Alkaline Phosphatase: 66 U/L (ref 39–117)
BUN: 18 mg/dL (ref 6–23)
CO2: 32 mEq/L (ref 19–32)
Calcium: 9.8 mg/dL (ref 8.4–10.5)
Chloride: 103 mEq/L (ref 96–112)
Creatinine, Ser: 1.42 mg/dL (ref 0.40–1.50)
GFR: 49.37 mL/min — ABNORMAL LOW (ref >60.00)
Glucose, Bld: 113 mg/dL — ABNORMAL HIGH (ref 70–99)
Potassium: 4.4 mEq/L (ref 3.5–5.1)
Sodium: 141 mEq/L (ref 135–145)
Total Bilirubin: 0.6 mg/dL (ref 0.2–1.2)
Total Protein: 7.3 g/dL (ref 6.0–8.3)

## 2022-07-28 LAB — MICROALBUMIN / CREATININE URINE RATIO
Creatinine,U: 124.5 mg/dL
Microalb Creat Ratio: 6.2 mg/g (ref 0.0–30.0)
Microalb, Ur: 7.7 mg/dL — ABNORMAL HIGH (ref 0.0–1.9)

## 2022-07-28 LAB — PSA: PSA: 0.44 ng/mL (ref 0.10–4.00)

## 2022-07-28 LAB — HEMOGLOBIN A1C: Hgb A1c MFr Bld: 8.8 % — ABNORMAL HIGH (ref 4.6–6.5)

## 2022-07-28 MED ORDER — GABAPENTIN 300 MG PO CAPS: 300.0000 mg | ORAL_CAPSULE | Freq: Two times a day (BID) | ORAL | 3 refills | Status: DC

## 2022-07-28 MED ORDER — OFLOXACIN 0.3 % OT SOLN: 10.0000 [drp] | Freq: Every day | OTIC | 1 refills | Status: DC

## 2022-07-28 MED ORDER — NITROGLYCERIN 0.4 MG SL SUBL: 0.4000 mg | SUBLINGUAL_TABLET | SUBLINGUAL | 3 refills | Status: DC | PRN

## 2022-08-01 MED ORDER — DAPAGLIFLOZIN PROPANEDIOL 5 MG PO TABS: 5.0000 mg | ORAL_TABLET | Freq: Every day | ORAL | 3 refills | Status: DC

## 2022-08-01 NOTE — Addendum Note (Signed)
Addended by: Lamar Blinks C on: 08/01/2022 09:35 AM   Modules accepted: Orders

## 2022-08-07 NOTE — Progress Notes (Unsigned)
Office Visit    Patient Name: Barry [name] Date of Encounter: 08/07/2022  Primary Care Provider:  Darreld Mclean, MD Primary Cardiologist:  Sherren Mocha, MD Primary Electrophysiologist: None  Chief Complaint    Barry [name] is a 71 y.o. male with PMH of CAD s/p NSTEMI 02/2014 with DES to proximal RCA CABG x4 2022 with (LIMA-LAD, reverse SVG-PDA, first diagonal, OM) , HLD, CHF, HFmrEF, Mesenteric dissection and PAD, CVA 2011, CKD, HTN, AAA who presents today for 6-week follow-up. Past Medical History    Past Medical History:  Diagnosis Date   Anginal pain ([place])    CAD (coronary artery disease)    LHC (02/26/14):  >> PCI:  Xience DES to pRCA // LHC (03/10/14):  >> PCI:  Xience DES to [facility] // Myoview 2/16: low risk // Cath 2016: patent stents; stable dz - med Rx // Myoview 10/22: anterolateral ischemia, inferoapical infarct w peri infarct ischemia; EF 41; High Risk // Cath 12/22: LCx stent occl, severe LAD and Dx dz, diffuse RCA disease >> CABG   Carotid stenosis    a. Carotid US (02/27/14):  Bilateral ICA 1-39%   CKD (chronic kidney disease) stage 3    Depression    Diabetes mellitus    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[place])    a. Mesenteric Artery Duplex (7/15):  pSMA chronic dissection with aneurysmal dilation of 1.29 cm (VVS);  b.  Chest CTA (02/26/14):  IMPRESSION:  1. No aortic  dissection or other acute abnormality.  2. 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  3. Stable 17 mm ectasia of the left common iliac artery.  4. Atherosclerosis, including aortoiliac and coronary artery disease.    HFmrEF (heart failure with mildly reduced EF) 09/2015   Echo 6/15: EF 50-55 // Echo 1/17: mod LVH, EF 55%, inf-lat HK, Gr 1 DD, mild LAE // Echocardiogram 10/22: EF 40-45, Gr 1 DD, mildly reduced RVSF, RVSP 26.6, trivial MR, trivial AI, post and ant-lat HK   Hyperlipidemia    Hypertension    Iliac artery aneurysm, left (HCC)    Myocardial infarction (HCC)    PVC's (premature ventricular  contractions) 07/21/2021   Monitor 10/22:  NSR avg HR 71; no AF/Flutter; no high grade HB or pathologic pauses PVC burden 20%, rare supraventricular beats w/o sustained arrhythmia    Stroke ([place]) 09/26/2009   Past Surgical History:  Procedure Laterality Date   CARDIAC CATHETERIZATION     CARDIAC CATHETERIZATION N/A 06/25/2015   Procedure: Left Heart Cath and Coronary Angiography;  Surgeon: Burnell Blanks, MD;  Location: [place] CV LAB;  Service: Cardiovascular;  Laterality: N/A;   COLONOSCOPY WITH PROPOFOL N/A 07/23/2015   Procedure: COLONOSCOPY WITH PROPOFOL;  Surgeon: [name], MD;  [facility];  Service: Endoscopy;  Laterality: N/A;   CORONARY ANGIOPLASTY     CORONARY ARTERY BYPASS GRAFT N/A 09/21/2021   Procedure: CORONARY ARTERY BYPASS GRAFTING (CABG)  X FOUR, ON PUMP, USING LEFT INTERNAL MAMMARY ARTERY AND RIGHT ENDOSCOPIC GREATER SAPHENOUS VEIN CONDUITS;  Surgeon: Lajuana Matte, MD;  Location: [place];  Service: Open Heart Surgery;  Laterality: N/A;   ENDOVEIN HARVEST OF GREATER SAPHENOUS VEIN  09/21/2021   Procedure: ENDOVEIN HARVEST OF GREATER SAPHENOUS VEIN;  Surgeon: Lajuana Matte, MD;  Location: [place];  Service: Open Heart Surgery;;   LEFT HEART CATH AND CORONARY ANGIOGRAPHY N/A 08/31/2021   Procedure: LEFT HEART CATH AND CORONARY ANGIOGRAPHY;  Surgeon: Belva Crome, MD;  Location: [place]  CV LAB;  Service: Cardiovascular;  Laterality: N/A;   LEFT HEART CATHETERIZATION WITH CORONARY ANGIOGRAM N/A 02/26/2014   Procedure: LEFT HEART CATHETERIZATION WITH CORONARY ANGIOGRAM;  Surgeon: Wellington Hampshire, MD;  Location: [place] CATH LAB;  Service: Cardiovascular;  Laterality: N/A;   LEFT HEART CATHETERIZATION WITH CORONARY ANGIOGRAM N/A 03/10/2014   Procedure: LEFT HEART CATHETERIZATION WITH CORONARY ANGIOGRAM;  Surgeon: Jettie Booze, MD;  Location: [facility] CATH LAB;  Service: Cardiovascular;  Laterality: N/A;   RIGHT HEART CATH AND CORONARY/GRAFT  ANGIOGRAPHY N/A 07/01/2022   Procedure: RIGHT HEART CATH AND CORONARY/GRAFT ANGIOGRAPHY;  Surgeon: Sherren Mocha, MD;  Location: [place] CV LAB;  Service: Cardiovascular;  Laterality: N/A;   TEE WITHOUT CARDIOVERSION N/A 09/21/2021   Procedure: TRANSESOPHAGEAL ECHOCARDIOGRAM (TEE);  Surgeon: Lajuana Matte, MD;  Location: [place];  Service: Open Heart Surgery;  Laterality: N/A;    Allergies  Allergies  Allergen Reactions   Metformin And Related     Pt feels that this causes constipation and will not take     History of Present Illness    Barry [name]  is a 71 year old male with the above mention past medical history who presents today for 6-week follow-up.  He was originally seen in 2015 with NSTEMI and underwent LHC that demonstrated ruptured plaque and thrombus in the proximal RCA that was treated with DES x1.  He was readmitted and underwent subsequent LHC that demonstrated high-grade stenosis in the circumflex which was treated with DES. He had Lexiscan Cardiolite in 2/16 with no ischemia.  He was seen in follow-up 05/2015 and was found to have substernal chest burning.  He was advised to follow-up in the ED if pain persisted and patient presented to the ED on 9/28 with unstable angina.  He had negative troponins and EKG was nonischemic.  He underwent left heart cath that revealed no changes from previous cath in 2015 with medical therapy recommended.  Seen on 06/2021 for follow-up and found to have shortness of breath and chest discomfort with PVCs.  Event monitor was placed that showed a 20% PVC burden and Myoview was abnormal.  He was unavailable for follow-up and certified letter was sent to his residence.  He presented for follow-up 07/2021 and endorsed fatigue with no syncope.  He was sent for [facility] that revealed severe mid LAD and D1 disease, total occlusion of the LCx stent with L-L collaterals, and diffuse mod disease in the RCA with a patent proximal stent.  CVTS was consulted and  patient underwent CABG x4 with (LIMA-LAD, reverse SVG-PDA, first diagonal, OM).  Echo was completed and revealed EF of 45-50%.  He tolerated surgery well with no postop complications.  He was seen in follow-up subsequently with no recurrence of chest pain or angina.  He was seen by Dr. Burt Knack in follow-up on 04/2022 was doing well with exception of palpitations and no chest pain.  He reported weakness and lack of energy with hot sensation in his chest.  He was euvolemic on examination  had Myoview completed to assess low EF and 3-day ZIO monitor for palpitations.  Myoview imaging demonstrated anterior lateral ischemia as well as inferior apical infarct with peri-infarct ischemia.  He was seen in follow-up 06/27/2022 and due to his comorbidities and uncontrolled diabetes he underwent R/LHC that revealed patency of LIMA to LAD and total occlusion of saphenous vein graft conduits.  Medical therapy was recommended at that time.  Since last being seen in the office patient reports***.  Patient denies chest pain, palpitations, dyspnea, PND, orthopnea, nausea, vomiting, dizziness, syncope, edema, weight gain, or early satiety.     ***Notes:  Home Medications    Current Outpatient Medications  Medication Sig Dispense Refill   acetaminophen (TYLENOL) 325 MG tablet Take 2 tablets (650 mg total) by mouth every 6 (six) hours as needed for mild pain, fever or headache.     aspirin 81 MG EC tablet Take 1 tablet (81 mg total) by mouth daily.     blood glucose meter kit and supplies Dispense based on patient and insurance preference. Use up to four times daily as directed. (FOR ICD-10 E10.9, E11.9). 1 each 0   dapagliflozin propanediol (FARXIGA) 5 MG TABS tablet Take 1 tablet (5 mg total) by mouth daily before breakfast. 90 tablet 3   gabapentin (NEURONTIN) 300 MG capsule Take 1 capsule (300 mg total) by mouth 2 (two) times daily. 180 capsule 3   glucose blood (ONETOUCH ULTRA) test strip USE  STRIP TO CHECK GLUCOSE  TWICE DAILY 200 each 12   Insulin Pen Needle (TECHLITE PEN NEEDLES) 32G X 8 MM MISC Use daily to administer insulin 100 each 3   Lancets (ONETOUCH DELICA PLUS [id number]) MISC Check blood sugars twice daily 200 each 12   LANTUS 100 UNIT/ML injection INJECT 22 TO 27 UNITS INTO THE SKIN ONCE DAILY 10 mL 1   lisinopril (ZESTRIL) 20 MG tablet Take 20 mg by mouth daily. Patient taking 1/2 tablet daily     metoprolol tartrate (LOPRESSOR) 25 MG tablet Take 1 tablet (25 mg total) by mouth 2 (two) times daily. 180 tablet 3   nitroGLYCERIN (NITROSTAT) 0.4 MG SL tablet Place 1 tablet (0.4 mg total) under the tongue every 5 (five) minutes as needed for chest pain. 50 tablet 3   ofloxacin (FLOXIN OTIC) 0.3 % OTIC solution Place 10 drops into the right ear daily. Use for 7- 10 days as needed 5 mL 1   rosuvastatin (CRESTOR) 40 MG tablet Take 1 tablet (40 mg total) by mouth daily. 90 tablet 3   No current facility-administered medications for this visit.     Review of Systems  Please see the history of present illness.    (+)*** (+)***  All other systems reviewed and are otherwise negative except as noted above.  Physical Exam    Wt Readings from Last 3 Encounters:  07/28/22 171 lb 6.4 oz (77.7 kg)  07/01/22 165 lb (74.8 kg)  06/27/22 169 lb 12.8 oz (77 kg)   RA:QTMAU were no vitals filed for this visit.,There is no height or weight on file to calculate BMI.  Constitutional:      Appearance: Healthy appearance. Not in distress.  Neck:     Vascular: JVD normal.  Pulmonary:     Effort: Pulmonary effort is normal.     Breath sounds: No wheezing. No rales. Diminished in the bases Cardiovascular:     Normal rate. Regular rhythm. Normal S1. Normal S2.      Murmurs: There is no murmur.  Edema:    Peripheral edema absent.  Abdominal:     Palpations: Abdomen is soft non tender. There is no hepatomegaly.  Skin:    General: Skin is warm and dry.  Neurological:     General: No focal deficit present.      Mental Status: Alert and oriented to person, place and time.     Cranial Nerves: Cranial nerves are intact.  EKG/LABS/Other Studies Reviewed    ECG personally reviewed  by me today - ***  Risk Assessment/Calculations:   {Does this patient have ATRIAL FIBRILLATION?[phone]}        Lab Results  Component Value Date   WBC 8.3 07/28/2022   HGB 15.7 07/28/2022   HCT 48.1 07/28/2022   MCV 86.4 07/28/2022   PLT 150.0 07/28/2022   Lab Results  Component Value Date   CREATININE 1.42 07/28/2022   BUN 18 07/28/2022   NA 141 07/28/2022   K 4.4 07/28/2022   CL 103 07/28/2022   CO2 32 07/28/2022   Lab Results  Component Value Date   ALT 14 07/28/2022   AST 19 07/28/2022   ALKPHOS 66 07/28/2022   BILITOT 0.6 07/28/2022   Lab Results  Component Value Date   CHOL 179 06/07/2021   HDL 36.00 (L) 06/07/2021   LDLCALC 35 05/11/2020   LDLDIRECT 53 10/12/2021   TRIG 273.0 (H) 06/07/2021   CHOLHDL 5 06/07/2021    Lab Results  Component Value Date   HGBA1C 8.8 (H) 07/28/2022    Assessment & Plan    1.  Coronary artery disease: -s/p NSTEMI 02/2014 with DES to proximal RCA CABG x4 2022 with (LIMA-LAD, reverse SVG-PDA, first diagonal, OM) -Recent repeat cath completed revealing patentLIMA to LAD and total occlusion of saphenous vein graft conduits with recommendation of medical therapy -Today patient reports*** -Continue current GDMT with ASA 81 mg, metoprolol 25 mg twice daily, rosuvastatin 40 mg and as needed Nitrostat 0.4 mg  2. HFmrEF: -Last 2D echo completed revealed Echo was completed and revealed EF of 45-50%. -Today patient is***on exam -Continue current GDMT with Farxiga 5 mg, lisinopril 20 mg -GDMT titration hindered by***   3.  Hyperlipidemia: -LDL was 53 on 09/2021 -Patient currently on Crestor 40 mg daily  4.  Essential hypertension: -Patient's blood pressure today was*** -Continue lisinopril and metoprolol as noted above  5.       Disposition:  Follow-up with Sherren Mocha, MD or APP in *** months {Are you ordering a CV Procedure (e.g. stress test, cath, DCCV, TEE, etc)?   Press F2        :[id number]}   Medication Adjustments/Labs and Tests Ordered: Current medicines are reviewed at length with the patient today.  Concerns regarding medicines are outlined above.   Signed, Mable Fill, Marissa Nestle, NP 08/07/2022, 2:36 PM [facility]  Note:  This document was prepared using Dragon voice recognition software and may include unintentional dictation errors.

## 2022-08-08 ENCOUNTER — Encounter: Payer: Self-pay | Admitting: Nurse Practitioner

## 2022-08-08 ENCOUNTER — Ambulatory Visit: Payer: Medicare Other | Attending: Nurse Practitioner | Admitting: Nurse Practitioner

## 2022-08-08 VITALS — BP 154/60 | HR 59 | Ht 65.0 in | Wt 167.0 lb

## 2022-08-08 DIAGNOSIS — E785 Hyperlipidemia, unspecified: Secondary | ICD-10-CM | POA: Diagnosis not present

## 2022-08-08 DIAGNOSIS — I5020 Unspecified systolic (congestive) heart failure: Secondary | ICD-10-CM

## 2022-08-08 DIAGNOSIS — I10 Essential (primary) hypertension: Secondary | ICD-10-CM

## 2022-08-08 DIAGNOSIS — I25119 Atherosclerotic heart disease of native coronary artery with unspecified angina pectoris: Secondary | ICD-10-CM

## 2022-08-08 MED ORDER — ISOSORBIDE MONONITRATE ER 30 MG PO TB24: 30.0000 mg | ORAL_TABLET | Freq: Every day | ORAL | 1 refills | Status: DC

## 2022-08-08 NOTE — Patient Instructions (Signed)
Medication Instructions:  START Imdur '30mg'$  Take 1 tablet once day *If you need a refill on your cardiac medications before your next appointment, please call your pharmacy*   Lab Work: None ordered   Testing/Procedures: None ordered   Follow-Up: At [facility], you and your health needs are our priority.  As part of our continuing mission to provide you with exceptional heart care, we have created designated Provider Care Teams.  These Care Teams include your primary Cardiologist (physician) and Advanced Practice Providers (APPs -  Physician Assistants and Nurse Practitioners) who all work together to provide you with the care you need, when you need it.  We recommend signing up for the patient portal called "MyChart".  Sign up information is provided on this After Visit Summary.  MyChart is used to connect with patients for Virtual Visits (Telemedicine).  Patients are able to view lab/test results, encounter notes, upcoming appointments, etc.  Non-urgent messages can be sent to your provider as well.   To learn more about what you can do with MyChart, go to [email or web address].    Your next appointment:   2 month(s)  The format for your next appointment:   In Person  Provider:   Ambrose Pancoast, NP        Other Instructions   Important Information About Sugar

## 2022-09-23 ENCOUNTER — Ambulatory Visit: Payer: Medicare Other | Admitting: Nurse Practitioner

## 2022-09-30 ENCOUNTER — Emergency Department (HOSPITAL_COMMUNITY): Payer: Medicare Other

## 2022-09-30 ENCOUNTER — Other Ambulatory Visit: Payer: Self-pay

## 2022-09-30 ENCOUNTER — Emergency Department (HOSPITAL_COMMUNITY)
Admission: EM | Admit: 2022-09-30 | Discharge: 2022-09-30 | Disposition: A | Discharge status: Home / Self Care | Payer: Medicare Other | Attending: Emergency Medicine | Admitting: Emergency Medicine

## 2022-09-30 DIAGNOSIS — J9811 Atelectasis: Secondary | ICD-10-CM | POA: Diagnosis not present

## 2022-09-30 DIAGNOSIS — I2510 Atherosclerotic heart disease of native coronary artery without angina pectoris: Secondary | ICD-10-CM | POA: Diagnosis not present

## 2022-09-30 DIAGNOSIS — N1330 Unspecified hydronephrosis: Secondary | ICD-10-CM | POA: Diagnosis not present

## 2022-09-30 DIAGNOSIS — Z794 Long term (current) use of insulin: Secondary | ICD-10-CM | POA: Insufficient documentation

## 2022-09-30 DIAGNOSIS — Z8673 Personal history of transient ischemic attack (TIA), and cerebral infarction without residual deficits: Secondary | ICD-10-CM | POA: Diagnosis not present

## 2022-09-30 DIAGNOSIS — I5032 Chronic diastolic (congestive) heart failure: Secondary | ICD-10-CM | POA: Insufficient documentation

## 2022-09-30 DIAGNOSIS — N183 Chronic kidney disease, stage 3 unspecified: Secondary | ICD-10-CM | POA: Diagnosis not present

## 2022-09-30 DIAGNOSIS — I130 Hypertensive heart and chronic kidney disease with heart failure and stage 1 through stage 4 chronic kidney disease, or unspecified chronic kidney disease: Secondary | ICD-10-CM | POA: Diagnosis not present

## 2022-09-30 DIAGNOSIS — Z87891 Personal history of nicotine dependence: Secondary | ICD-10-CM | POA: Diagnosis not present

## 2022-09-30 DIAGNOSIS — N201 Calculus of ureter: Secondary | ICD-10-CM | POA: Diagnosis not present

## 2022-09-30 DIAGNOSIS — Z79899 Other long term (current) drug therapy: Secondary | ICD-10-CM | POA: Insufficient documentation

## 2022-09-30 DIAGNOSIS — I7409 Other arterial embolism and thrombosis of abdominal aorta: Secondary | ICD-10-CM | POA: Diagnosis not present

## 2022-09-30 DIAGNOSIS — K8020 Calculus of gallbladder without cholecystitis without obstruction: Secondary | ICD-10-CM | POA: Diagnosis not present

## 2022-09-30 DIAGNOSIS — Z743 Need for continuous supervision: Secondary | ICD-10-CM | POA: Diagnosis not present

## 2022-09-30 DIAGNOSIS — E1122 Type 2 diabetes mellitus with diabetic chronic kidney disease: Secondary | ICD-10-CM | POA: Diagnosis not present

## 2022-09-30 DIAGNOSIS — R109 Unspecified abdominal pain: Secondary | ICD-10-CM | POA: Diagnosis not present

## 2022-09-30 DIAGNOSIS — R1013 Epigastric pain: Secondary | ICD-10-CM | POA: Diagnosis present

## 2022-09-30 DIAGNOSIS — I7410 Embolism and thrombosis of unspecified parts of aorta: Secondary | ICD-10-CM

## 2022-09-30 DIAGNOSIS — N132 Hydronephrosis with renal and ureteral calculous obstruction: Secondary | ICD-10-CM | POA: Diagnosis not present

## 2022-09-30 DIAGNOSIS — Z7982 Long term (current) use of aspirin: Secondary | ICD-10-CM | POA: Insufficient documentation

## 2022-09-30 DIAGNOSIS — I10 Essential (primary) hypertension: Secondary | ICD-10-CM | POA: Diagnosis not present

## 2022-09-30 DIAGNOSIS — R6889 Other general symptoms and signs: Secondary | ICD-10-CM | POA: Diagnosis not present

## 2022-09-30 LAB — URINALYSIS, ROUTINE W REFLEX MICROSCOPIC
Bilirubin Urine: NEGATIVE
Glucose, UA: >=500 mg/dL — AB
Ketones, ur: NEGATIVE mg/dL
Leukocytes,Ua: NEGATIVE
Nitrite: NEGATIVE
Protein, ur: NEGATIVE mg/dL
Specific Gravity, Urine: 1.021 (ref 1.005–1.030)
pH: 6 (ref 5.0–8.0)

## 2022-09-30 LAB — COMPREHENSIVE METABOLIC PANEL
ALT: 23 U/L (ref 0–44)
AST: 24 U/L (ref 15–41)
Albumin: 4 g/dL (ref 3.5–5.0)
Alkaline Phosphatase: 66 U/L (ref 38–126)
Anion gap: 11 (ref 5–15)
BUN: 23 mg/dL (ref 8–23)
CO2: 27 mmol/L (ref 22–32)
Calcium: 9.2 mg/dL (ref 8.9–10.3)
Chloride: 102 mmol/L (ref 98–111)
Creatinine, Ser: 1.52 mg/dL — ABNORMAL HIGH (ref 0.61–1.24)
GFR, Estimated: 48 mL/min — ABNORMAL LOW (ref >60)
Glucose, Bld: 129 mg/dL — ABNORMAL HIGH (ref 70–99)
Potassium: 3.9 mmol/L (ref 3.5–5.1)
Sodium: 140 mmol/L (ref 135–145)
Total Bilirubin: 0.9 mg/dL (ref 0.3–1.2)
Total Protein: 6.9 g/dL (ref 6.5–8.1)

## 2022-09-30 LAB — CBC
HCT: 53.6 % — ABNORMAL HIGH (ref 39.0–52.0)
Hemoglobin: 17.7 g/dL — ABNORMAL HIGH (ref 13.0–17.0)
MCH: 28.6 pg (ref 26.0–34.0)
MCHC: 33 g/dL (ref 30.0–36.0)
MCV: 86.6 fL (ref 80.0–100.0)
Platelets: 145 10*3/uL — ABNORMAL LOW (ref 150–400)
RBC: 6.19 MIL/uL — ABNORMAL HIGH (ref 4.22–5.81)
RDW: 12.4 % (ref 11.5–15.5)
WBC: 11.5 10*3/uL — ABNORMAL HIGH (ref 4.0–10.5)
nRBC: 0 % (ref 0.0–0.2)

## 2022-09-30 LAB — TROPONIN I (HIGH SENSITIVITY): Troponin I (High Sensitivity): 10 ng/L (ref <18)

## 2022-09-30 LAB — LIPASE, BLOOD: Lipase: 93 U/L — ABNORMAL HIGH (ref 11–51)

## 2022-09-30 LAB — LACTIC ACID, PLASMA: Lactic Acid, Venous: 1.7 mmol/L (ref 0.5–1.9)

## 2022-09-30 MED ORDER — ONDANSETRON 4 MG PO TBDP: ORAL_TABLET | ORAL | 0 refills | Status: DC

## 2022-09-30 MED ORDER — ONDANSETRON HCL 4 MG/2ML IJ SOLN
4.0000 mg | Freq: Once | INTRAMUSCULAR | Status: AC
  Administered 2022-09-30: 4 mg via INTRAVENOUS
  Filled 2022-09-30: qty 2

## 2022-09-30 MED ORDER — MORPHINE SULFATE 15 MG PO TABS: 7.5000 mg | ORAL_TABLET | ORAL | 0 refills | Status: DC | PRN

## 2022-09-30 MED ORDER — MORPHINE SULFATE (PF) 4 MG/ML IV SOLN
4.0000 mg | Freq: Once | INTRAVENOUS | Status: AC
  Administered 2022-09-30: 4 mg via INTRAVENOUS
  Filled 2022-09-30: qty 1

## 2022-09-30 MED ORDER — SODIUM CHLORIDE 0.9 % IV BOLUS
1000.0000 mL | Freq: Once | INTRAVENOUS | Status: AC
  Administered 2022-09-30: 1000 mL via INTRAVENOUS

## 2022-09-30 MED ORDER — TAMSULOSIN HCL 0.4 MG PO CAPS: 0.4000 mg | ORAL_CAPSULE | Freq: Every day | ORAL | 0 refills | Status: DC

## 2022-09-30 MED ORDER — IOHEXOL 350 MG/ML SOLN
75.0000 mL | Freq: Once | INTRAVENOUS | Status: AC | PRN
  Administered 2022-09-30: 75 mL via INTRAVENOUS

## 2022-09-30 MED ORDER — IOHEXOL 350 MG/ML SOLN
100.0000 mL | Freq: Once | INTRAVENOUS | Status: AC | PRN
  Administered 2022-09-30: 100 mL via INTRAVENOUS

## 2022-09-30 NOTE — Discharge Instructions (Signed)
Take 4 over the counter ibuprofen tablets 3 times a day or 2 over-the-counter naproxen tablets twice a day for pain. Also take tylenol '1000mg'$ (2 extra strength) four times a day.   Then take the pain medicine if you feel like you need it. Narcotics do not help with the pain, they only make you care about it less.  You can become addicted to this, people may break into your house to steal it.  It will constipate you.  If you drive under the influence of this medicine you can get a DUI.    You have a fairly large stone on the left side.  This is all the way at the top of the transition from the kidney down to the bladder.  You need to follow-up with the urologist in the office [place] they can see him and ensure that it is going to pass.  Please return to the Emergency Department for uncontrolled pain or if you develop a fever.  ???????? ibuprofen 4 ?????? 3 ??????? 1 ???? ? 2 ?????? naproxen ?????????????????? 2 ?????????????????????????????? ?????????? Tylenol '1000mg'$  (2 ?????????????) ??????????????????  ???????????????????????????????? ???????????????????????????????????????? ?????????????????????????????????? ????????????????????????????????????????????? ???????????????????????????????? ?????????????????????????????????????????????? ???????????????????????? ????????????????????????????????????? ?????????????? DUI ?  ?????????????????????????? ?????????????????????????????????????????????????????????????????????? ???????????????????????????????? urologist ???????????????? ????????????????????????? ????????????????????????? ??????????????????????????????????????????????????????????????????????????? ??????????????????????????

## 2022-09-30 NOTE — ED Provider Notes (Signed)
[facility] EMERGENCY DEPARTMENT Provider Note  CSN: [id number] Arrival date & time: 09/30/22 0744  Chief Complaint(s) Abdominal Pain and Generalized Body Aches  HPI Barry [name] is a 72 y.o. male with history of coronary artery disease, chronic kidney disease, diabetes, prior mesenteric artery dissection, CHF, hyperlipidemia, hypertension presenting to the emergency department with abdominal pain.  He reports epigastric pain for the past 2 days.  Denies nausea, vomiting.  He reports decreased appetite.  No diarrhea, no urinary symptoms such as dysuria or foul smelling urine.  Symptoms are moderate.  Worsened with eating.  Denies similar symptoms  Qualified [place] healthcare interpreter used   Past Medical History Past Medical History:  Diagnosis Date   Anginal pain ([place])    CAD (coronary artery disease)    LHC (02/26/14):  >> PCI:  Xience DES to pRCA // LHC (03/10/14):  >> PCI:  Xience DES to [facility] // Myoview 2/16: low risk // Cath 2016: patent stents; stable dz - med Rx // Myoview 10/22: anterolateral ischemia, inferoapical infarct w peri infarct ischemia; EF 41; High Risk // Cath 12/22: LCx stent occl, severe LAD and Dx dz, diffuse RCA disease >> CABG   Carotid stenosis    a. Carotid US (02/27/14):  Bilateral ICA 1-39%   CKD (chronic kidney disease) stage 3    Depression    Diabetes mellitus    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[place])    a. Mesenteric Artery Duplex (7/15):  pSMA chronic dissection with aneurysmal dilation of 1.29 cm (VVS);  b.  Chest CTA (02/26/14):  IMPRESSION:  1. No aortic  dissection or other acute abnormality.  2. 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  3. Stable 17 mm ectasia of the left common iliac artery.  4. Atherosclerosis, including aortoiliac and coronary artery disease.    HFmrEF (heart failure with mildly reduced EF) 09/2015   Echo 6/15: EF 50-55 // Echo 1/17: mod LVH, EF 55%, inf-lat HK, Gr 1 DD, mild LAE // Echocardiogram 10/22: EF  40-45, Gr 1 DD, mildly reduced RVSF, RVSP 26.6, trivial MR, trivial AI, post and ant-lat HK   Hyperlipidemia    Hypertension    Iliac artery aneurysm, left (HCC)    Myocardial infarction ([place])    PVC's (premature ventricular contractions) 07/21/2021   Monitor 10/22:  NSR avg HR 71; no AF/Flutter; no high grade HB or pathologic pauses PVC burden 20%, rare supraventricular beats w/o sustained arrhythmia    Stroke ([place]) 09/26/2009   Patient Active Problem List   Diagnosis Date Noted   Abnormal nuclear cardiac imaging test    S/P CABG x 4 09/21/2021   Abnormal nuclear stress test 08/31/2021   PVC's (premature ventricular contractions) 07/21/2021   CKD (chronic kidney disease) stage 3, GFR 30-59 ml/min ([place]) 07/21/2021   HFmrEF (heart failure with mildly reduced EF) 09/2015   Atherosclerotic peripheral vascular disease with ulceration ([place]) 04/03/2015   Aneurysm of iliac artery ([place]) 03/18/2014   Abdominal pain, unspecified site 03/18/2014   Syncope in setting of nausea and vomiting 03/07/14 03/08/2014   Coronary artery disease 02/28/2014   Non compliance with medical treatment 02/28/2014   Dyslipidemia, goal LDL below 70 02/28/2014   Bradycardia- beta blocker decreased 02/28/2014   Chronic diastolic CHF (congestive heart failure) ([place]) 02/28/2014   Type 2 diabetes mellitus with manifestations ([place]) 02/27/2014   TIA (transient ischemic attack) 02/26/2014   History of non-ST elevation myocardial infarction (NSTEMI) 02/26/2014   Weakness-Left arm / Lef 12/06/2013   Mesenteric  artery stenosis ([place]) 12/06/2013   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[place]) 12/03/2012   Hypertension 11/10/2011   History of CVA in adulthood 11/10/2011   Language barrier 11/10/2011   Home Medication(s) Prior to Admission medications   Medication Sig Start Date End Date Taking? Authorizing Provider  acetaminophen (TYLENOL) 325 MG tablet Take 2 tablets (650 mg total) by mouth every 6 (six) hours as needed for mild  pain, fever or headache. 09/28/21   Gold, Wilder Glade, PA-C  aspirin 81 MG EC tablet Take 1 tablet (81 mg total) by mouth daily. 01/10/22   Loel Dubonnet, NP  blood glucose meter kit and supplies Dispense based on patient and insurance preference. Use up to four times daily as directed. (FOR ICD-10 E10.9, E11.9). 05/10/19   Copland, Gay Filler, MD  dapagliflozin propanediol (FARXIGA) 5 MG TABS tablet Take 1 tablet (5 mg total) by mouth daily before breakfast. 08/01/22   Copland, Gay Filler, MD  gabapentin (NEURONTIN) 300 MG capsule Take 300 mg by mouth as needed (pain).    [provider]  glucose blood (ONETOUCH ULTRA) test strip USE  STRIP TO CHECK GLUCOSE TWICE DAILY 05/13/22   Copland, Gay Filler, MD  Insulin Pen Needle (TECHLITE PEN NEEDLES) 32G X 8 MM MISC Use daily to administer insulin 05/13/22   Copland, Gay Filler, MD  isosorbide mononitrate (IMDUR) 30 MG 24 hr tablet Take 1 tablet (30 mg total) by mouth daily. 08/08/22   Marylu Lund., NP  Lancets [facility] DELICA PLUS [id number]) MISC Check blood sugars twice daily 07/20/22   Copland, Gay Filler, MD  LANTUS 100 UNIT/ML injection INJECT 22 TO 27 UNITS INTO THE SKIN ONCE DAILY 07/20/22   Copland, Gay Filler, MD  lisinopril (ZESTRIL) 20 MG tablet Take 20 mg by mouth daily. Patient taking 1/2 tablet daily 03/14/22   [provider]  metoprolol tartrate (LOPRESSOR) 25 MG tablet Take 1 tablet (25 mg total) by mouth 2 (two) times daily. 10/12/21   Loel Dubonnet, NP  nitroGLYCERIN (NITROSTAT) 0.4 MG SL tablet Place 1 tablet (0.4 mg total) under the tongue every 5 (five) minutes as needed for chest pain. 07/28/22   Copland, Gay Filler, MD  ofloxacin (FLOXIN OTIC) 0.3 % OTIC solution Place 10 drops into the right ear daily. Use for 7- 10 days as needed 07/28/22   Copland, Gay Filler, MD  rosuvastatin (CRESTOR) 40 MG tablet Take 1 tablet (40 mg total) by mouth daily. 01/10/22   Loel Dubonnet, NP                                                                                                                                     Past Surgical History Past Surgical History:  Procedure Laterality Date   CARDIAC CATHETERIZATION     CARDIAC CATHETERIZATION N/A 06/25/2015   Procedure: Left Heart Cath and Coronary Angiography;  Surgeon: Burnell Blanks, MD;  Location:  [facility] INVASIVE CV LAB;  Service: Cardiovascular;  Laterality: N/A;   COLONOSCOPY WITH PROPOFOL N/A 07/23/2015   Procedure: COLONOSCOPY WITH PROPOFOL;  Surgeon: [name], MD;  Location: [facility] ENDOSCOPY;  Service: Endoscopy;  Laterality: N/A;   CORONARY ANGIOPLASTY     CORONARY ARTERY BYPASS GRAFT N/A 09/21/2021   Procedure: CORONARY ARTERY BYPASS GRAFTING (CABG)  X FOUR, ON PUMP, USING LEFT INTERNAL MAMMARY ARTERY AND RIGHT ENDOSCOPIC GREATER SAPHENOUS VEIN CONDUITS;  Surgeon: Lajuana Matte, MD;  Location: [facility];  Service: Open Heart Surgery;  Laterality: N/A;   ENDOVEIN HARVEST OF GREATER SAPHENOUS VEIN  09/21/2021   Procedure: ENDOVEIN HARVEST OF GREATER SAPHENOUS VEIN;  Surgeon: Lajuana Matte, MD;  Location: [facility];  Service: Open Heart Surgery;;   LEFT HEART CATH AND CORONARY ANGIOGRAPHY N/A 08/31/2021   Procedure: LEFT HEART CATH AND CORONARY ANGIOGRAPHY;  Surgeon: Belva Crome, MD;  Location: [facility] CV LAB;  Service: Cardiovascular;  Laterality: N/A;   LEFT HEART CATHETERIZATION WITH CORONARY ANGIOGRAM N/A 02/26/2014   Procedure: LEFT HEART CATHETERIZATION WITH CORONARY ANGIOGRAM;  Surgeon: Wellington Hampshire, MD;  Location: [facility] CATH LAB;  Service: Cardiovascular;  Laterality: N/A;   LEFT HEART CATHETERIZATION WITH CORONARY ANGIOGRAM N/A 03/10/2014   Procedure: LEFT HEART CATHETERIZATION WITH CORONARY ANGIOGRAM;  Surgeon: Jettie Booze, MD;  Location: [facility] CATH LAB;  Service: Cardiovascular;  Laterality: N/A;   RIGHT HEART CATH AND CORONARY/GRAFT ANGIOGRAPHY N/A 07/01/2022   Procedure: RIGHT HEART CATH AND CORONARY/GRAFT ANGIOGRAPHY;  Surgeon: Sherren Mocha, MD;  Location: [facility] CV LAB;  Service: Cardiovascular;  Laterality: N/A;   TEE WITHOUT CARDIOVERSION N/A 09/21/2021   Procedure: TRANSESOPHAGEAL ECHOCARDIOGRAM (TEE);  Surgeon: Lajuana Matte, MD;  Location: [facility];  Service: Open Heart Surgery;  Laterality: N/A;   Family History Family History  Problem Relation Age of Onset   Diabetes Father    Hypertension Father    Heart attack Father    Stroke Neg Hx     Social History Social History   Tobacco Use   Smoking status: Former    Packs/day: 0.25    Years: 38.00    Total pack years: 9.50    Types: Cigarettes    Quit date: 2008    Years since quitting: 16.0   Smokeless tobacco: Never  Vaping Use   Vaping Use: Never used  Substance Use Topics   Alcohol use: No    Alcohol/week: 0.0 standard drinks of alcohol   Drug use: No   Allergies Metformin and related  Review of Systems Review of Systems  All other systems reviewed and are negative.   Physical Exam Vital Signs  I have reviewed the triage vital signs BP 122/77   Pulse 74   Temp 98 F (36.7 C) (Oral)   Resp 16   SpO2 96%  Physical Exam Vitals and nursing note reviewed.  Constitutional:      General: He is not in acute distress.    Appearance: Normal appearance.  HENT:     Mouth/Throat:     Mouth: Mucous membranes are moist.  Eyes:     Conjunctiva/sclera: Conjunctivae normal.  Cardiovascular:     Rate and Rhythm: Normal rate and regular rhythm.  Pulmonary:     Effort: Pulmonary effort is normal. No respiratory distress.     Breath sounds: Normal breath sounds.  Abdominal:     General: Abdomen is flat.     Palpations: Abdomen is soft.     Tenderness: There  is abdominal tenderness in the epigastric area.  Musculoskeletal:     Right lower leg: No edema.     Left lower leg: No edema.  Skin:    General: Skin is warm and dry.     Capillary Refill: Capillary refill takes less than 2 seconds.  Neurological:     Mental Status: He is  alert and oriented to person, place, and time. Mental status is at baseline.  Psychiatric:        Mood and Affect: Mood normal.        Behavior: Behavior normal.     ED Results and Treatments Labs (all labs ordered are listed, but only abnormal results are displayed) Labs Reviewed  LIPASE, BLOOD - Abnormal; Notable for the following components:      Result Value   Lipase 93 (*)    All other components within normal limits  COMPREHENSIVE METABOLIC PANEL - Abnormal; Notable for the following components:   Glucose, Bld 129 (*)    Creatinine, Ser 1.52 (*)    GFR, Estimated 48 (*)    All other components within normal limits  CBC - Abnormal; Notable for the following components:   WBC 11.5 (*)    RBC 6.19 (*)    Hemoglobin 17.7 (*)    HCT 53.6 (*)    Platelets 145 (*)    All other components within normal limits  URINALYSIS, ROUTINE W REFLEX MICROSCOPIC - Abnormal; Notable for the following components:   Glucose, UA >=500 (*)    Hgb urine dipstick MODERATE (*)    Bacteria, UA RARE (*)    All other components within normal limits  LACTIC ACID, PLASMA  TROPONIN I (HIGH SENSITIVITY)                                                                                                                          Radiology CT Angio Abd/Pel W and/or Wo Contrast  Result Date: 09/30/2022 CLINICAL DATA:  Acute mesenteric ischemia EXAM: CTA ABDOMEN AND PELVIS WITHOUT AND WITH CONTRAST TECHNIQUE: Multidetector CT imaging of the abdomen and pelvis was performed using the standard protocol during bolus administration of intravenous contrast. Multiplanar reconstructed images and MIPs were obtained and reviewed to evaluate the vascular anatomy. RADIATION DOSE REDUCTION: This exam was performed according to the departmental dose-optimization program which includes automated exposure control, adjustment of the mA and/or kV according to patient size and/or use of iterative reconstruction technique. CONTRAST:   135m OMNIPAQUE IOHEXOL 350 MG/ML SOLN COMPARISON:  11/16/2017 and previous FINDINGS: VASCULAR Aorta: Nonocclusive mural thrombus in the visualized distal descending thoracic segment, an embolic risk. Moderate partially calcified plaque in the infrarenal abdominal aorta with nonocclusive eccentric mural thrombus and chronic intraluminal web as before. No aneurysm or stenosis. Celiac: Origin stenosis over length of approximately 1.2 cm from the origin at the level of the median arcuate ligament, mildly atheromatous but patent distally. SMA: Proximal plaque without high-grade stenosis. Chronic intraluminal web or short-segment dissection approximately  4 cm from the origin, with fusiform dilatation of the vessel up to 1.2 cm at this level, continued patency distally. Renals: Single left, with partially calcified ostial plaque resulting in only mild stenosis, patent distally. Single right, with partially calcified eccentric ostial plaque resulting in moderate short-segment stenosis, patent distally. IMA: High-grade origin stenosis related to aortic wall plaque, patent distally. Inflow: There is atheromatous plaque and mural thrombus in bilateral common iliac arteries, ectatic up to 1.8 cm on the left, 1.4 cm right. High-grade origin stenosis of the left internal iliac artery with associated intraluminal thrombus, stable since previous. Bilateral external iliac arteries are mildly tortuous, patent. Proximal Outflow: Mildly atheromatous, patent. Veins: Patent hepatic veins, portal vein, SM V, splenic vein, bilateral renal veins. Iliac venous system patent, with tapered narrowing of the left common iliac vein near its confluence with the IVC. IVC unremarkable. Review of the MIP images confirms the above findings. NON-VASCULAR Lower chest: No pleural or pericardial effusion. Coronary calcifications. Hepatobiliary: Partially calcified gallstones up to 8 mm in the lumen of the nondilated gallbladder. No focal liver lesion or  biliary ductal dilatation. Pancreas: Unremarkable. No pancreatic ductal dilatation or surrounding inflammatory changes. Spleen: Normal in size without focal abnormality. Adrenals/Urinary Tract: No adrenal mass. New moderate right hydronephrosis and proximal ureterectasis down to the level of a 9 mm proximal right ureteral calculus. Left kidney unremarkable. Urinary bladder partially distended. Stomach/Bowel: Stomach decompressed. Small bowel nondistended. Appendix not identified. Colon is partially distended by gas and fecal material with a redundant sigmoid segment, no acute findings. Lymphatic: No abdominal or pelvic adenopathy. Reproductive: Mild prostate enlargement with central coarse calcification. Other: No ascites.  No free air. Musculoskeletal: Sternotomy wires.  No acute findings. IMPRESSION: 1. New moderate right hydronephrosis and proximal ureterectasis down to a 9 mm right ureteral obstructing calculus. 2. Nonocclusive mural thrombus in the visualized distal descending thoracic segment, an embolic risk. 3. Stable 1.2 cm fusiform dilatation of the proximal SMA, with chronic nonocclusive intraluminal web or short-segment dissection. 4. Stable 1.8 cm left common iliac artery aneurysm. 5. Stable high-grade origin stenosis of the left internal iliac artery. 6. Cholelithiasis. 7.  Aortic Atherosclerosis (ICD10-I70.0). Electronically Signed   By: Lucrezia Europe M.D.   On: 09/30/2022 13:59    Pertinent labs & imaging results that were available during my care of the patient were reviewed by me and considered in my medical decision making (see MDM for details).  Medications Ordered in ED Medications  ondansetron (ZOFRAN) injection 4 mg (4 mg Intravenous Given 09/30/22 1235)  sodium chloride 0.9 % bolus 1,000 mL (0 mLs Intravenous Stopped 09/30/22 1456)  morphine (PF) 4 MG/ML injection 4 mg (4 mg Intravenous Given 09/30/22 1235)  iohexol (OMNIPAQUE) 350 MG/ML injection 100 mL (100 mLs Intravenous Contrast Given  09/30/22 1327)  Procedures Procedures  (including critical care time)  Medical Decision Making / ED Course   MDM:  72 year old male presenting with epigastric pain.  Patient well-appearing, mildly hypertensive.  Differential includes gastritis, pancreatitis, mesenteric ischemia or vascular pathology, volvulus, obstruction.  Patient has history of mesenteric artery dissection.  Will obtain CT imaging of the abdomen to further evaluate.  Lower concern for occult ACS, but given medical history will check troponin and EKG. Will treat pain and re-assess  Clinical Course as of 09/30/22 1510  Fri Sep 30, 2022  1426 CT abdomen and pelvis shows a 9 mm kidney stone with obstruction.  Will discuss with the urology team whether patient needs stenting now or can follow-up.  Creatinine at baseline.  No sign of urinary infection on urinalysis.  On further history he does report the pain is worse on the left side.  CT also shows chronic vascular changes and a mural thrombus.  Will discuss with vascular surgery whether patient needs anticoagulation [WS]  1428 Troponin I (High Sensitivity): 10 [WS]  6378 Discussed with Dr. Unk Lightning, who requests obtaining CTA chest to further evaluate [WS]  1504 Discussed kidney stone with Dr. Junious Silk.  He recommends outpatient follow-up.  Signed out to oncoming physician Dr. Tyrone Nine pending CTA chest to further evaluate aortic mural thrombus [WS]    Clinical Course User Index [WS] Cristie Hem, MD     Additional history obtained: -Additional history obtained from family -External records from outside source obtained and reviewed including: Chart review including previous notes, labs, imaging, consultation notes including PMD visit 07/28/22   Lab Tests: -I ordered, reviewed, and interpreted labs.   The pertinent results include:    Labs Reviewed  LIPASE, BLOOD - Abnormal; Notable for the following components:      Result Value   Lipase 93 (*)    All other components within normal limits  COMPREHENSIVE METABOLIC PANEL - Abnormal; Notable for the following components:   Glucose, Bld 129 (*)    Creatinine, Ser 1.52 (*)    GFR, Estimated 48 (*)    All other components within normal limits  CBC - Abnormal; Notable for the following components:   WBC 11.5 (*)    RBC 6.19 (*)    Hemoglobin 17.7 (*)    HCT 53.6 (*)    Platelets 145 (*)    All other components within normal limits  URINALYSIS, ROUTINE W REFLEX MICROSCOPIC - Abnormal; Notable for the following components:   Glucose, UA >=500 (*)    Hgb urine dipstick MODERATE (*)    Bacteria, UA RARE (*)    All other components within normal limits  LACTIC ACID, PLASMA  TROPONIN I (HIGH SENSITIVITY)    Notable for mild elevated lipase, mild leukocytosis, UA without signs of infection  EKG   EKG Interpretation  Date/Time:    Ventricular Rate:    PR Interval:    QRS Duration:   QT Interval:    QTC Calculation:   R Axis:     Text Interpretation:           Imaging Studies ordered: I ordered imaging studies including CTA abdomen On my interpretation imaging demonstrates left obstructing 9 mm stone.  Distal thoracic mural thrombus I independently visualized and interpreted imaging. I agree with the radiologist interpretation   Medicines ordered and prescription drug management: Meds ordered this encounter  Medications   ondansetron (ZOFRAN) injection 4 mg   sodium chloride 0.9 % bolus 1,000 mL   morphine (PF) 4  MG/ML injection 4 mg   iohexol (OMNIPAQUE) 350 MG/ML injection 100 mL    -I have reviewed the patients home medicines and have made adjustments as needed   Consultations Obtained: I requested consultation with the urology,  and discussed lab and imaging findings as well as pertinent plan - they recommend: outpatient follow up   Cardiac  Monitoring: The patient was maintained on a cardiac monitor.  I personally viewed and interpreted the cardiac monitored which showed an underlying rhythm of: NSR  Social Determinants of Health:  Diagnosis or treatment significantly limited by social determinants of health: former smoker   Reevaluation: After the interventions noted above, I reevaluated the patient and found that they have improved  Co morbidities that complicate the patient evaluation  Past Medical History:  Diagnosis Date   Anginal pain ([place])    CAD (coronary artery disease)    LHC (02/26/14):  >> PCI:  Xience DES to pRCA // LHC (03/10/14):  >> PCI:  Xience DES to [facility] // Myoview 2/16: low risk // Cath 2016: patent stents; stable dz - med Rx // Myoview 10/22: anterolateral ischemia, inferoapical infarct w peri infarct ischemia; EF 41; High Risk // Cath 12/22: LCx stent occl, severe LAD and Dx dz, diffuse RCA disease >> CABG   Carotid stenosis    a. Carotid US (02/27/14):  Bilateral ICA 1-39%   CKD (chronic kidney disease) stage 3    Depression    Diabetes mellitus    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[place])    a. Mesenteric Artery Duplex (7/15):  pSMA chronic dissection with aneurysmal dilation of 1.29 cm (VVS);  b.  Chest CTA (02/26/14):  IMPRESSION:  1. No aortic  dissection or other acute abnormality.  2. 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  3. Stable 17 mm ectasia of the left common iliac artery.  4. Atherosclerosis, including aortoiliac and coronary artery disease.    HFmrEF (heart failure with mildly reduced EF) 09/2015   Echo 6/15: EF 50-55 // Echo 1/17: mod LVH, EF 55%, inf-lat HK, Gr 1 DD, mild LAE // Echocardiogram 10/22: EF 40-45, Gr 1 DD, mildly reduced RVSF, RVSP 26.6, trivial MR, trivial AI, post and ant-lat HK   Hyperlipidemia    Hypertension    Iliac artery aneurysm, left (HCC)    Myocardial infarction (HCC)    PVC's (premature ventricular contractions) 07/21/2021   Monitor 10/22:  NSR avg HR  71; no AF/Flutter; no high grade HB or pathologic pauses PVC burden 20%, rare supraventricular beats w/o sustained arrhythmia    Stroke ([place]) 09/26/2009      Dispostion: Disposition decision including need for hospitalization was considered, and patient disposition pending at time of sign out.     Final Clinical Impression(s) / ED Diagnoses Final diagnoses:  Left ureteral stone  Aortic mural thrombus ([place])     This chart was dictated using voice recognition software.  Despite best efforts to proofread,  errors can occur which can change the documentation meaning.    Cristie Hem, MD 09/30/22 [phone]

## 2022-09-30 NOTE — ED Provider Notes (Signed)
Received patient in turnover from Dr. Truett Mainland .  Please see their note for further details of Hx, PE.  Briefly patient is a 72 y.o. male with a Abdominal Pain and Generalized Body Aches .  Patient was found to have a fairly large left proximal kidney stone with hydronephrosis.  Pain is well-controlled discussion with urology plans for follow-up as an outpatient.  Incidentally he was noted to have quite a bit of thrombus in his aorta.  This was discussed with vascular surgery who recommended obtaining a CT scan of the chest with contrast.  I personally discussed the case with Dr. Virl Cagey, vascular did not recommend anticoagulation at this time.  Recommend continuing his aspirin therapy as well as a statin.    Deno Etienne, DO 09/30/22 1628

## 2022-09-30 NOTE — ED Triage Notes (Signed)
EMS stated, pt has abdominal pain throughout the night. No N/V/D , only abdominal pain and bodyaches.

## 2022-09-30 NOTE — Progress Notes (Signed)
  Hospital Consult   This is a 72 y.o. male who presented with complaints of abdominal pain.  CT demonstrated nephrolithiasis as well as incidental finding of mural thrombus within the aorta.  I evaluated his most recent CT chest, CT angio abdomen pelvis, as well as CTs ranging back to 2017.  There appears to be chronic with some progression in the amount of mural thrombus present over the last 6 years. He continues to be asymptomatic with no history of distal embolization.  [place] would benefit from continued aspirin, statin therapy.   At this time, surgical intervention to cover these lesions would come with high risk of embolization. Should he have an embolic event, we would discuss the need for intervention.    Cassandria Santee MD [facility] [phone] 09/30/2022  5:09 PM

## 2022-10-04 ENCOUNTER — Telehealth: Payer: Self-pay

## 2022-10-04 NOTE — Telephone Encounter (Signed)
     Patient  visit on 1/5  at [facility]    Have you been able to follow up with your primary care physician? Yes calling 1/10  The patient was or was not able to obtain any needed medicine or equipment. Yes   Are there diet recommendations that you are having difficulty following? Na   Patient expresses understanding of discharge instructions and education provided has no other needs at this time. Yes      [place], [facility], Care Management  [phone] [address], [place], [place] [address] Phone: [phone] Email: [email or web address]

## 2022-10-06 NOTE — Progress Notes (Signed)
Office Visit    Patient Name: Barry [name] Date of Encounter: 10/06/2022  Primary Care Provider:  Darreld Mclean, MD Primary Cardiologist:  Sherren Mocha, MD Primary Electrophysiologist: None  Chief Complaint    Scheier Britten is a 72 y.o. male with PMH of CAD s/p NSTEMI 02/2014 with DES to proximal RCA CABG x4 2022 with (LIMA-LAD, reverse SVG-PDA, first diagonal, OM) , HLD, CHF, HFmrEF, Mesenteric dissection and PAD, CVA 2011, CKD, HTN, AAA who presents today for 6-week follow-up.   Past Medical History    Past Medical History:  Diagnosis Date   Anginal pain ([place])    CAD (coronary artery disease)    LHC (02/26/14):  >> PCI:  Xience DES to pRCA // LHC (03/10/14):  >> PCI:  Xience DES to [facility] // Myoview 2/16: low risk // Cath 2016: patent stents; stable dz - med Rx // Myoview 10/22: anterolateral ischemia, inferoapical infarct w peri infarct ischemia; EF 41; High Risk // Cath 12/22: LCx stent occl, severe LAD and Dx dz, diffuse RCA disease >> CABG   Carotid stenosis    a. Carotid US (02/27/14):  Bilateral ICA 1-39%   CKD (chronic kidney disease) stage 3    Depression    Diabetes mellitus    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[place])    a. Mesenteric Artery Duplex (7/15):  pSMA chronic dissection with aneurysmal dilation of 1.29 cm (VVS);  b.  Chest CTA (02/26/14):  IMPRESSION:  1. No aortic  dissection or other acute abnormality.  2. 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  3. Stable 17 mm ectasia of the left common iliac artery.  4. Atherosclerosis, including aortoiliac and coronary artery disease.    HFmrEF (heart failure with mildly reduced EF) 09/2015   Echo 6/15: EF 50-55 // Echo 1/17: mod LVH, EF 55%, inf-lat HK, Gr 1 DD, mild LAE // Echocardiogram 10/22: EF 40-45, Gr 1 DD, mildly reduced RVSF, RVSP 26.6, trivial MR, trivial AI, post and ant-lat HK   Hyperlipidemia    Hypertension    Iliac artery aneurysm, left (HCC)    Myocardial infarction (HCC)    PVC's (premature  ventricular contractions) 07/21/2021   Monitor 10/22:  NSR avg HR 71; no AF/Flutter; no high grade HB or pathologic pauses PVC burden 20%, rare supraventricular beats w/o sustained arrhythmia    Stroke ([place]) 09/26/2009   Past Surgical History:  Procedure Laterality Date   CARDIAC CATHETERIZATION     CARDIAC CATHETERIZATION N/A 06/25/2015   Procedure: Left Heart Cath and Coronary Angiography;  Surgeon: Burnell Blanks, MD;  Location: [place] CV LAB;  Service: Cardiovascular;  Laterality: N/A;   COLONOSCOPY WITH PROPOFOL N/A 07/23/2015   Procedure: COLONOSCOPY WITH PROPOFOL;  Surgeon: [name], MD;  Location: [facility];  Service: Endoscopy;  Laterality: N/A;   CORONARY ANGIOPLASTY     CORONARY ARTERY BYPASS GRAFT N/A 09/21/2021   Procedure: CORONARY ARTERY BYPASS GRAFTING (CABG)  X FOUR, ON PUMP, USING LEFT INTERNAL MAMMARY ARTERY AND RIGHT ENDOSCOPIC GREATER SAPHENOUS VEIN CONDUITS;  Surgeon: Lajuana Matte, MD;  Location: [place];  Service: Open Heart Surgery;  Laterality: N/A;   ENDOVEIN HARVEST OF GREATER SAPHENOUS VEIN  09/21/2021   Procedure: ENDOVEIN HARVEST OF GREATER SAPHENOUS VEIN;  Surgeon: Lajuana Matte, MD;  Location: [place];  Service: Open Heart Surgery;;   LEFT HEART CATH AND CORONARY ANGIOGRAPHY N/A 08/31/2021   Procedure: LEFT HEART CATH AND CORONARY ANGIOGRAPHY;  Surgeon: Belva Crome, MD;  Location:  [place] INVASIVE CV LAB;  Service: Cardiovascular;  Laterality: N/A;   LEFT HEART CATHETERIZATION WITH CORONARY ANGIOGRAM N/A 02/26/2014   Procedure: LEFT HEART CATHETERIZATION WITH CORONARY ANGIOGRAM;  Surgeon: Wellington Hampshire, MD;  Location: [place] CATH LAB;  Service: Cardiovascular;  Laterality: N/A;   LEFT HEART CATHETERIZATION WITH CORONARY ANGIOGRAM N/A 03/10/2014   Procedure: LEFT HEART CATHETERIZATION WITH CORONARY ANGIOGRAM;  Surgeon: Jettie Booze, MD;  Location: [facility] CATH LAB;  Service: Cardiovascular;  Laterality: N/A;   RIGHT HEART CATH AND  CORONARY/GRAFT ANGIOGRAPHY N/A 07/01/2022   Procedure: RIGHT HEART CATH AND CORONARY/GRAFT ANGIOGRAPHY;  Surgeon: Sherren Mocha, MD;  Location: [place] CV LAB;  Service: Cardiovascular;  Laterality: N/A;   TEE WITHOUT CARDIOVERSION N/A 09/21/2021   Procedure: TRANSESOPHAGEAL ECHOCARDIOGRAM (TEE);  Surgeon: Lajuana Matte, MD;  Location: [place];  Service: Open Heart Surgery;  Laterality: N/A;    Allergies  Allergies  Allergen Reactions   Metformin And Related     Pt feels that this causes constipation and will not take     History of Present Illness    Barry [name]  is a 72 year old male with the above mention past medical history who presents today for 6-week follow-up.  He was originally seen in 2015 with NSTEMI and underwent LHC that demonstrated ruptured plaque and thrombus in the proximal RCA that was treated with DES x1.  He was readmitted and underwent subsequent LHC that demonstrated high-grade stenosis in the circumflex which was treated with DES. He had Lexiscan Cardiolite in 2/16 with no ischemia.  He was seen in follow-up 05/2015 and was found to have substernal chest burning.  He was advised to follow-up in the ED if pain persisted and patient presented to the ED on 9/28 with unstable angina.  He had negative troponins and EKG was nonischemic.  He underwent left heart cath that revealed no changes from previous cath in 2015 with medical therapy recommended.  Seen on 06/2021 for follow-up and found to have shortness of breath and chest discomfort with PVCs.  Event monitor was placed that showed a 20% PVC burden and Myoview was abnormal.  He was unavailable for follow-up and certified letter was sent to his residence.  He presented for follow-up 07/2021 and endorsed fatigue with no syncope.  He was sent for [facility] that revealed severe mid LAD and D1 disease, total occlusion of the LCx stent with L-L collaterals, and diffuse mod disease in the RCA with a patent proximal stent.  CVTS was  consulted and patient underwent CABG x4 with (LIMA-LAD, reverse SVG-PDA, first diagonal, OM).  Echo was completed and revealed EF of 45-50%.  He tolerated surgery well with no postop complications.  He was seen in follow-up subsequently with no recurrence of chest pain or angina.  He was seen by Dr. Burt Knack in follow-up on 04/2022 was doing well with exception of palpitations and no chest pain.  He reported weakness and lack of energy with hot sensation in his chest.  He was euvolemic on examination  had Myoview completed to assess low EF and 3-day ZIO monitor for palpitations.  Myoview imaging demonstrated anterior lateral ischemia as well as inferior apical infarct with peri-infarct ischemia.  He was seen in follow-up 06/27/2022 and due to his comorbidities and uncontrolled diabetes he underwent R/LHC that revealed patency of LIMA to LAD and total occlusion of saphenous vein graft conduits.  Medical therapy was recommended at that time.  He was last seen 08/08/2022 for post left  heart cath follow-up.  During visit patient still reported experiencing waxing waning chest discomfort.He reports this to be his anginal equivalent with burning sensation extending up his chest. He does report relief with nitroglycerin x1.  He was started on Imdur 30 mg during visit with instructions to follow-up in the ED if pain not relieved and changes in intensification.  Mr.  Cato presents today with his wife for 67-monthfollow-up visit.  Since last being seen in the office patient reports that his chest pain has improved with Imdur.  He does note occasional breakthrough chest discomfort with increased physical activity.  His blood pressure today is well-controlled at 138/64 and heart rate was 67 bpm. He reports increased fatigue and dizziness since his previous visit.  In reviewing his medications he was recently started on tamsulosin due to left kidney stones with hydronephrosis.  He is otherwise been doing well with no changes to his  medications except for addition of tamsulosin.  Patient denies chest pain, palpitations, dyspnea, PND, orthopnea, nausea, vomiting, dizziness, syncope, edema, weight gain, or early satiety.   Home Medications    Current Outpatient Medications  Medication Sig Dispense Refill   acetaminophen (TYLENOL) 325 MG tablet Take 2 tablets (650 mg total) by mouth every 6 (six) hours as needed for mild pain, fever or headache.     aspirin 81 MG EC tablet Take 1 tablet (81 mg total) by mouth daily.     blood glucose meter kit and supplies Dispense based on patient and insurance preference. Use up to four times daily as directed. (FOR ICD-10 E10.9, E11.9). 1 each 0   dapagliflozin propanediol (FARXIGA) 5 MG TABS tablet Take 1 tablet (5 mg total) by mouth daily before breakfast. 90 tablet 3   gabapentin (NEURONTIN) 300 MG capsule Take 300 mg by mouth as needed (pain).     glucose blood (ONETOUCH ULTRA) test strip USE  STRIP TO CHECK GLUCOSE TWICE DAILY 200 each 12   Insulin Pen Needle (TECHLITE PEN NEEDLES) 32G X 8 MM MISC Use daily to administer insulin 100 each 3   isosorbide mononitrate (IMDUR) 30 MG 24 hr tablet Take 1 tablet (30 mg total) by mouth daily. 30 tablet 1   Lancets (ONETOUCH DELICA PLUS [id number] MISC Check blood sugars twice daily 200 each 12   LANTUS 100 UNIT/ML injection INJECT 22 TO 27 UNITS INTO THE SKIN ONCE DAILY 10 mL 1   lisinopril (ZESTRIL) 20 MG tablet Take 20 mg by mouth daily. Patient taking 1/2 tablet daily     metoprolol tartrate (LOPRESSOR) 25 MG tablet Take 1 tablet (25 mg total) by mouth 2 (two) times daily. 180 tablet 3   morphine (MSIR) 15 MG tablet Take 0.5 tablets (7.5 mg total) by mouth every 4 (four) hours as needed for severe pain. 7 tablet 0   nitroGLYCERIN (NITROSTAT) 0.4 MG SL tablet Place 1 tablet (0.4 mg total) under the tongue every 5 (five) minutes as needed for chest pain. 50 tablet 3   ofloxacin (FLOXIN OTIC) 0.3 % OTIC solution Place 10 drops into the right  ear daily. Use for 7- 10 days as needed 5 mL 1   ondansetron (ZOFRAN-ODT) 4 MG disintegrating tablet '4mg'$  ODT q4 hours prn nausea/vomit 20 tablet 0   rosuvastatin (CRESTOR) 40 MG tablet Take 1 tablet (40 mg total) by mouth daily. 90 tablet 3   tamsulosin (FLOMAX) 0.4 MG CAPS capsule Take 1 capsule (0.4 mg total) by mouth daily after supper. 30 capsule 0  No current facility-administered medications for this visit.     Review of Systems  Please see the history of present illness.    (+) Left flank pain (+) Waxing waning chest pain  All other systems reviewed and are otherwise negative except as noted above.  Physical Exam    Wt Readings from Last 3 Encounters:  08/08/22 167 lb (75.8 kg)  07/28/22 171 lb 6.4 oz (77.7 kg)  07/01/22 165 lb (74.8 kg)   OZ:DGUYQ were no vitals filed for this visit.,There is no height or weight on file to calculate BMI.  Constitutional:      Appearance: Healthy appearance. Not in distress.  Neck:     Vascular: JVD normal.  Pulmonary:     Effort: Pulmonary effort is normal.     Breath sounds: No wheezing. No rales. Diminished in the bases Cardiovascular:     Normal rate. Regular rhythm. Normal S1. Normal S2.      Murmurs: There is no murmur.  Edema:    Peripheral edema absent.  Abdominal:     Palpations: Abdomen is soft non tender. There is no hepatomegaly.  Skin:    General: Skin is warm and dry.  Neurological:     General: No focal deficit present.     Mental Status: Alert and oriented to person, place and time.     Cranial Nerves: Cranial nerves are intact.  EKG/LABS/Other Studies Reviewed    ECG personally reviewed by me today -none completed today.   Lab Results  Component Value Date   WBC 11.5 (H) 09/30/2022   HGB 17.7 (H) 09/30/2022   HCT 53.6 (H) 09/30/2022   MCV 86.6 09/30/2022   PLT 145 (L) 09/30/2022   Lab Results  Component Value Date   CREATININE 1.52 (H) 09/30/2022   BUN 23 09/30/2022   NA 140 09/30/2022   K 3.9  09/30/2022   CL 102 09/30/2022   CO2 27 09/30/2022   Lab Results  Component Value Date   ALT 23 09/30/2022   AST 24 09/30/2022   ALKPHOS 66 09/30/2022   BILITOT 0.9 09/30/2022   Lab Results  Component Value Date   CHOL 179 06/07/2021   HDL 36.00 (L) 06/07/2021   LDLCALC 35 05/11/2020   LDLDIRECT 53 10/12/2021   TRIG 273.0 (H) 06/07/2021   CHOLHDL 5 06/07/2021    Lab Results  Component Value Date   HGBA1C 8.8 (H) 07/28/2022    Assessment & Plan    1.  Coronary artery disease: -s/p NSTEMI 02/2014 with DES to proximal RCA CABG x4 2022 with (LIMA-LAD, reverse SVG-PDA, first diagonal, OM) -Today patient reports improvement to his chest discomfort since previous visit with Imdur 30 mg daily.  He does report some occasional breakthrough discomfort with increased physical activity. -Take additional 15 mg of Imdur for breakthrough chest discomfort. -Patient was advised to seek follow-up at ED if pain is not relieved with as needed nitroglycerin and is increased in intensity. -Continue current GDMT with ASA 81 mg, metoprolol 25 mg twice daily, rosuvastatin 40 mg and as needed Nitrostat 0.4 mg   2. HFmrEF: -Last 2D echo completed revealed Echo was completed and revealed EF of 45-50%. -Today patient is euvolemic on exam -Continue current GDMT with Farxiga 5 mg, lisinopril 20 mg -We will plan to increase GDMT at next visit if blood pressure remains stable   3.  Hyperlipidemia: -LDL was 53 on 09/2021 -Patient currently on Crestor 40 mg daily   4.  Essential hypertension: -Patient's blood  pressure today was elevated at 134/60 -Continue lisinopril and metoprolol as noted above    5.  Dizziness and fatigue: -Patient was recently seen in the ED for left flank pain and found to have kidney stone with hydronephrosis.  He was started on tamsulosin and was taking in the morning with increased dizziness and fatigue. -Advised him to take medication in the evening and to contact office if  fatigue and dizziness has not improved.  Disposition: Follow-up with Sherren Mocha, MD or APP in 3 months    Medication Adjustments/Labs and Tests Ordered: Current medicines are reviewed at length with the patient today.  Concerns regarding medicines are outlined above.   Signed, Mable Fill, Marissa Nestle, NP 10/06/2022, 12:26 PM [facility]  Note:  This document was prepared using Dragon voice recognition software and may include unintentional dictation errors.

## 2022-10-07 ENCOUNTER — Encounter: Payer: Self-pay | Admitting: Nurse Practitioner

## 2022-10-07 ENCOUNTER — Ambulatory Visit: Payer: Medicare Other | Attending: Nurse Practitioner | Admitting: Nurse Practitioner

## 2022-10-07 VITALS — BP 138/64 | HR 67 | Ht 65.0 in | Wt 168.6 lb

## 2022-10-07 DIAGNOSIS — R42 Dizziness and giddiness: Secondary | ICD-10-CM | POA: Diagnosis not present

## 2022-10-07 DIAGNOSIS — I5020 Unspecified systolic (congestive) heart failure: Secondary | ICD-10-CM

## 2022-10-07 DIAGNOSIS — I10 Essential (primary) hypertension: Secondary | ICD-10-CM

## 2022-10-07 DIAGNOSIS — I25119 Atherosclerotic heart disease of native coronary artery with unspecified angina pectoris: Secondary | ICD-10-CM | POA: Diagnosis not present

## 2022-10-07 DIAGNOSIS — E785 Hyperlipidemia, unspecified: Secondary | ICD-10-CM

## 2022-10-07 DIAGNOSIS — R5383 Other fatigue: Secondary | ICD-10-CM

## 2022-10-07 MED ORDER — ISOSORBIDE MONONITRATE ER 30 MG PO TB24: 30.0000 mg | ORAL_TABLET | Freq: Every day | ORAL | 1 refills | Status: DC

## 2022-10-07 NOTE — Patient Instructions (Signed)
Medication Instructions:  YOU CAN TAKE AN ADDITIONAL HALF TABLET ('15MG'$ ) OF IMDUR AS NEEDED FOR CHEST PAIN  Take your Flomax medication at night  *If you need a refill on your cardiac medications before your next appointment, please call your pharmacy*   Lab Work: NONE ORDERED  Testing/Procedures: NONE ORDERED   Follow-Up: At [facility], you and your health needs are our priority.  As part of our continuing mission to provide you with exceptional heart care, we have created designated Provider Care Teams.  These Care Teams include your primary Cardiologist (physician) and Advanced Practice Providers (APPs -  Physician Assistants and Nurse Practitioners) who all work together to provide you with the care you need, when you need it.  We recommend signing up for the patient portal called "MyChart".  Sign up information is provided on this After Visit Summary.  MyChart is used to connect with patients for Virtual Visits (Telemedicine).  Patients are able to view lab/test results, encounter notes, upcoming appointments, etc.  Non-urgent messages can be sent to your provider as well.   To learn more about what you can do with MyChart, go to [email or web address].    Your next appointment:   3 month(s)  Provider:   Sherren Mocha, MD  or Richardson Dopp, PA-C   Other Instructions

## 2022-10-11 ENCOUNTER — Other Ambulatory Visit: Payer: Self-pay | Admitting: Family Medicine

## 2022-10-28 DIAGNOSIS — N201 Calculus of ureter: Secondary | ICD-10-CM | POA: Diagnosis not present

## 2022-10-31 ENCOUNTER — Telehealth: Payer: Self-pay | Admitting: Cardiovascular Disease

## 2022-10-31 NOTE — Telephone Encounter (Signed)
   Pre-operative Risk Assessment    Patient Name: Barry [name]  DOB: Jan 18, 1951 MRN: [id number]      Request for Surgical Clearance    Procedure:  ESWL  Date of Surgery:  TBD                                Surgeon:   Dr Jacalyn Lefevre Surgeon's Group or Practice Name:   Phone number:  [phone] Fax number:  [phone]   Type of Clearance Requested:   - Medical  - Pharmacy:  Hold Aspirin stop 3 days prior to surgery   Type of Anesthesia:  Local    Additional requests/questions:    Signed, Glyn Ade   10/31/2022, 1:55 PM

## 2022-10-31 NOTE — Telephone Encounter (Signed)
   Name: Barry [name]  DOB: 01-Aug-1951  MRN: [id number]  Primary Cardiologist: Sherren Mocha, MD  Chart reviewed as part of pre-operative protocol coverage. Because of Barry [name]'s past medical history and time since last visit, he will require a follow-up in-office visit in order to better assess preoperative cardiovascular risk.  Pre-op covering staff: - Please schedule appointment and call patient to inform them. If patient already had an upcoming appointment within acceptable timeframe, please add "pre-op clearance" to the appointment notes [facility] provider is aware. - Please contact requesting surgeon's office via preferred method (i.e, phone, fax) to inform them of need for appointment prior to surgery.   Lenna Sciara, NP  10/31/2022, 2:12 PM

## 2022-10-31 NOTE — Telephone Encounter (Signed)
DPR ok s/w Son Pros. I left message to call back to see if they would like a sooner appt for pre op clearance as I do not see a date for the procedure to be done.

## 2022-11-07 ENCOUNTER — Telehealth: Payer: Self-pay | Admitting: Cardiovascular Disease

## 2022-11-07 NOTE — Telephone Encounter (Signed)
Barry [name] is calling from [facility] requesting the patient be contacted again for a sooner appt. She reports he has a kidney stone that needs to be removed. She gave an updated number to contact the patient and reports he speaks good english Barry [name] an interpreter is not needed to call.

## 2022-11-07 NOTE — Telephone Encounter (Signed)
Spoke with patient's son (DPR on file) to rescheduled appointment to 2/16 with Coletta Memos, PA-C at 8:50 am.

## 2022-11-09 NOTE — Progress Notes (Signed)
Cardiology Clinic Note   Patient Name: Barry [name] Date of Encounter: 11/11/2022  Primary Care Provider:  Darreld Mclean, MD Primary Cardiologist:  Sherren Mocha, MD  Patient Profile     Barry [name] 72 year old male presents the clinic today for follow-up evaluation of his coronary artery disease and diastolic CHF.  He also presents for preoperative cardiac evaluation.  Past Medical History    Past Medical History:  Diagnosis Date   Anginal pain ([place])    CAD (coronary artery disease)    LHC (02/26/14):  >> PCI:  Xience DES to pRCA // LHC (03/10/14):  >> PCI:  Xience DES to [facility] // Myoview 2/16: low risk // Cath 2016: patent stents; stable dz - med Rx // Myoview 10/22: anterolateral ischemia, inferoapical infarct w peri infarct ischemia; EF 41; High Risk // Cath 12/22: LCx stent occl, severe LAD and Dx dz, diffuse RCA disease >> CABG   Carotid stenosis    a. Carotid US (02/27/14):  Bilateral ICA 1-39%   CKD (chronic kidney disease) stage 3    Depression    Diabetes mellitus    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[place])    a. Mesenteric Artery Duplex (7/15):  pSMA chronic dissection with aneurysmal dilation of 1.29 cm (VVS);  b.  Chest CTA (02/26/14):  IMPRESSION:  1. No aortic  dissection or other acute abnormality.  2. 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  3. Stable 17 mm ectasia of the left common iliac artery.  4. Atherosclerosis, including aortoiliac and coronary artery disease.    HFmrEF (heart failure with mildly reduced EF) 09/2015   Echo 6/15: EF 50-55 // Echo 1/17: mod LVH, EF 55%, inf-lat HK, Gr 1 DD, mild LAE // Echocardiogram 10/22: EF 40-45, Gr 1 DD, mildly reduced RVSF, RVSP 26.6, trivial MR, trivial AI, post and ant-lat HK   Hyperlipidemia    Hypertension    Iliac artery aneurysm, left (HCC)    Myocardial infarction (HCC)    PVC's (premature ventricular contractions) 07/21/2021   Monitor 10/22:  NSR avg HR 71; no AF/Flutter; no high grade HB or pathologic  pauses PVC burden 20%, rare supraventricular beats w/o sustained arrhythmia    Stroke ([place]) 09/26/2009   Past Surgical History:  Procedure Laterality Date   CARDIAC CATHETERIZATION     CARDIAC CATHETERIZATION N/A 06/25/2015   Procedure: Left Heart Cath and Coronary Angiography;  Surgeon: Burnell Blanks, MD;  Location: [place] [facility];  Service: Cardiovascular;  Laterality: N/A;   COLONOSCOPY WITH PROPOFOL N/A 07/23/2015   Procedure: COLONOSCOPY WITH PROPOFOL;  Surgeon: [name], MD;  Location: [facility];  Service: Endoscopy;  Laterality: N/A;   CORONARY ANGIOPLASTY     CORONARY ARTERY BYPASS GRAFT N/A 09/21/2021   Procedure: CORONARY ARTERY BYPASS GRAFTING (CABG)  X FOUR, ON PUMP, USING LEFT INTERNAL MAMMARY ARTERY AND RIGHT ENDOSCOPIC GREATER SAPHENOUS VEIN CONDUITS;  Surgeon: Lajuana Matte, MD;  Location: [place];  Service: Open Heart Surgery;  Laterality: N/A;   ENDOVEIN HARVEST OF GREATER SAPHENOUS VEIN  09/21/2021   Procedure: ENDOVEIN HARVEST OF GREATER SAPHENOUS VEIN;  Surgeon: Lajuana Matte, MD;  Location: [place];  Service: Open Heart Surgery;;   LEFT HEART CATH AND CORONARY ANGIOGRAPHY N/A 08/31/2021   Procedure: LEFT HEART CATH AND CORONARY ANGIOGRAPHY;  Surgeon: Belva Crome, MD;  Location: [place] [facility];  Service: Cardiovascular;  Laterality: N/A;   LEFT HEART CATHETERIZATION WITH CORONARY ANGIOGRAM N/A 02/26/2014   Procedure: LEFT  HEART CATHETERIZATION WITH CORONARY ANGIOGRAM;  Surgeon: Wellington Hampshire, MD;  Location: [facility] C[facility] LAB;  Service: Cardiovascular;  Laterality: N/A;   LEFT HEART CATHETERIZATION WITH CORONARY ANGIOGRAM N/A 03/10/2014   Procedure: LEFT HEART CATHETERIZATION WITH CORONARY ANGIOGRAM;  Surgeon: Jettie Booze, MD;  Location: [facility] CATH LAB;  Service: Cardiovascular;  Laterality: N/A;   RIGHT HEART CATH AND CORONARY/GRAFT ANGIOGRAPHY N/A 07/01/2022   Procedure: RIGHT HEART CATH AND CORONARY/GRAFT ANGIOGRAPHY;  Surgeon: Sherren Mocha, MD;  Location: [place] [facility];  Service: Cardiovascular;  Laterality: N/A;   TEE WITHOUT CARDIOVERSION N/A 09/21/2021   Procedure: TRANSESOPHAGEAL ECHOCARDIOGRAM (TEE);  Surgeon: Lajuana Matte, MD;  Location: [place];  Service: Open Heart Surgery;  Laterality: N/A;    Allergies  Allergies  Allergen Reactions   Metformin And Related     Pt feels that this causes constipation and will not take     History of Present Illness     Barry [name] has a PMH of HTN, dissection of mesenteric artery, TIA/CVA, CAD status post CABG x 4, chronic diastolic CHF, aneurysm of iliac artery, PVCs, type 2 diabetes, CKD stage III, and hyperlipidemia.  He presented with an NSTEMI 6/15 and received DES to his proximal RCA.  He underwent CABG x 4 in 2022 (LIMA-LAD, SVG-PDA, SVG-first diagonal, SVG-OM)  He was seen by Ambrose Pancoast, FNP on 10/07/2022.  During that time he presented for 85-monthfollow-up.  He reported improvements in his chest discomfort with the addition of Imdur medication.  He did note occasional breakthrough of chest discomfort with increased physical activity.  His blood pressure was 138/64.  His heart rate was 67.  He did note some increased fatigue and dizziness since his previous visit.  His medications were reviewed.  He denied palpitations, dyspnea, PND, orthopnea, lower extremity swelling and weight gain.  His 30 mg Imdur was continued and he was instructed to take an additional 15 mg for breakthrough chest discomfort.  His previous echocardiogram was noted to show an EF of 45-50%.  Follow-up was planned for 3 months.  He presents to the clinic today for follow-up evaluation and preoperative cardiac evaluation.  He states he has not had any further chest discomfort.  He is tolerating his medications well.  His biggest complaint today is with constipation.  He reports abdominal fullness and hard bowel movements.  We reviewed his open heart surgery and need for upcoming ESWL.  He  remains stable from a cardiac standpoint.  I recommended that he increase his hydration, fiber in his diet, and physical activity.  We will plan follow-up in 6 months.  Today he denies chest pain, shortness of breath, lower extremity edema, fatigue, palpitations,  presyncope, syncope, orthopnea, and PND.    Home Medications    Prior to Admission medications   Medication Sig Start Date End Date Taking? Authorizing Provider  acetaminophen (TYLENOL) 325 MG tablet Take 2 tablets (650 mg total) by mouth every 6 (six) hours as needed for mild pain, fever or headache. 09/28/21   Gold, WWilder Glade PA-C  aspirin 81 MG EC tablet Take 1 tablet (81 mg total) by mouth daily. 01/10/22   WLoel Dubonnet NP  blood glucose meter kit and supplies Dispense based on patient and insurance preference. Use up to four times daily as directed. (FOR ICD-10 E10.9, E11.9). 05/10/19   Copland, JGay Filler MD  dapagliflozin propanediol (FARXIGA) 5 MG TABS tablet Take 1 tablet (5 mg total) by mouth daily before breakfast. 08/01/22  Copland, Gay Filler, MD  gabapentin (NEURONTIN) 300 MG capsule Take 300 mg by mouth as needed (pain).    [provider]  glucose blood (ONETOUCH ULTRA) test strip USE  STRIP TO CHECK GLUCOSE TWICE DAILY 05/13/22   Copland, Gay Filler, MD  Insulin Pen Needle (TECHLITE PEN NEEDLES) 32G X 8 MM MISC Use daily to administer insulin 05/13/22   Copland, Gay Filler, MD  isosorbide mononitrate (IMDUR) 30 MG 24 hr tablet Take 1 tablet (30 mg total) by mouth daily. You can take an additional half tablet as needed for chest pain 10/07/22   Marylu Lund., NP  Lancets [facility] DELICA PLUS [id number]) MISC Check blood sugars twice daily 07/20/22   Copland, Gay Filler, MD  LANTUS 100 UNIT/ML injection INJECT 22 TO 27 UNITS SUBCUTANEOUSLY ONCE DAILY 10/12/22   Copland, Gay Filler, MD  lisinopril (ZESTRIL) 20 MG tablet Take 20 mg by mouth daily. Patient taking 1/2 tablet daily 03/14/22   [provider]   metoprolol tartrate (LOPRESSOR) 25 MG tablet Take 1 tablet (25 mg total) by mouth 2 (two) times daily. 10/12/21   Loel Dubonnet, NP  morphine (MSIR) 15 MG tablet Take 0.5 tablets (7.5 mg total) by mouth every 4 (four) hours as needed for severe pain. 09/30/22   Deno Etienne, DO  nitroGLYCERIN (NITROSTAT) 0.4 MG SL tablet Place 1 tablet (0.4 mg total) under the tongue every 5 (five) minutes as needed for chest pain. 07/28/22   Copland, Gay Filler, MD  ofloxacin (FLOXIN OTIC) 0.3 % OTIC solution Place 10 drops into the right ear daily. Use for 7- 10 days as needed 07/28/22   Copland, Gay Filler, MD  ondansetron (ZOFRAN-ODT) 4 MG disintegrating tablet 32m ODT q4 hours prn nausea/vomit 09/30/22   FDeno Etienne DO  rosuvastatin (CRESTOR) 40 MG tablet Take 1 tablet (40 mg total) by mouth daily. 01/10/22   WLoel Dubonnet NP  tamsulosin (FLOMAX) 0.4 MG CAPS capsule Take 1 capsule (0.4 mg total) by mouth daily after supper. 09/30/22   FDeno Etienne DO    Family History    Family History  Problem Relation Age of Onset   Diabetes Father    Hypertension Father    Heart attack Father    Stroke Neg Hx    He indicated that his mother is deceased. He indicated that his father is deceased. He indicated that his maternal grandmother is deceased. He indicated that his maternal grandfather is deceased. He indicated that his paternal grandmother is deceased. He indicated that his paternal grandfather is deceased. He indicated that the status of his neg hx is unknown.  Social History    Social History   Socioeconomic History   Marital status: Married    Spouse name: Not on file   Number of children: 3   Years of education: Not on file   Highest education level: Not on file  Occupational History   Occupation: Retired  Tobacco Use   Smoking status: Former    Packs/day: 0.25    Years: 38.00    Total pack years: 9.50    Types: Cigarettes    Quit date: 2008    Years since quitting: 16.1   Smokeless tobacco:  Never  Vaping Use   Vaping Use: Never used  Substance and Sexual Activity   Alcohol use: No    Alcohol/week: 0.0 standard drinks of alcohol   Drug use: No   Sexual activity: Yes  Other Topics Concern   Not on file  Social History Narrative   Marital: married.      Lives: with son,wife.       Children: 3 children; 6 grandchildren      Employed: unemployed; disability unknown reason/CVA L sided weakness      Tobacco:  Quit 2012; smoked 40 years.       Alcohol: no drinking now; social in past.       Drugs: none       Exercise: sporadic.       ADLs:  No driving since CVA.   Social Determinants of Health   Financial Resource Strain: Low Risk  (05/28/2021)   Overall Financial Resource Strain (CARDIA)    Difficulty of Paying Living Expenses: Not very hard  Food Insecurity: Food Insecurity Present (11/12/2021)   Hunger Vital Sign    Worried About Running Out of Food in the Last Year: Sometimes true    Ran Out of Food in the Last Year: Never true  Transportation Needs: No Transportation Needs (06/07/2021)   PRAPARE - Hydrologist (Medical): No    Lack of Transportation (Non-Medical): No  Physical Activity: Inactive (11/12/2021)   Exercise Vital Sign    Days of Exercise per Week: 0 days    Minutes of Exercise per Session: 0 min  Stress: Not on file  Social Connections: Not on file  Intimate Partner Violence: Not At Risk (11/12/2021)   Humiliation, Afraid, Rape, and Kick questionnaire    Fear of Current or Ex-Partner: No    Emotionally Abused: No    Physically Abused: No    Sexually Abused: No     Review of Systems    General:  No chills, fever, night sweats or weight changes.  Cardiovascular:  No chest pain, dyspnea on exertion, edema, orthopnea, palpitations, paroxysmal nocturnal dyspnea. Dermatological: No rash, lesions/masses Respiratory: No cough, dyspnea Urologic: No hematuria, dysuria Abdominal:   No nausea, vomiting, diarrhea, bright red blood  per rectum, melena, or hematemesis Neurologic:  No visual changes, wkns, changes in mental status. All other systems reviewed and are otherwise negative except as noted above.  Physical Exam    VS:  BP 136/78   Pulse 77   Ht 5' 5"$  (1.651 m)   Wt 167 lb 3.2 oz (75.8 kg)   BMI 27.82 kg/m  , BMI Body mass index is 27.82 kg/m. GEN: Well nourished, well developed, in no acute distress. HEENT: normal. Neck: Supple, no JVD, carotid bruits, or masses. Cardiac: RRR, no murmurs, rubs, or gallops. No clubbing, cyanosis, edema.  Radials/DP/PT 2+ and equal bilaterally.  Respiratory:  Respirations regular and unlabored, clear to auscultation bilaterally. GI: Soft, nontender, nondistended, BS + x 4. MS: no deformity or atrophy. Skin: warm and dry, no rash. Neuro:  Strength and sensation are intact. Psych: Normal affect.  Accessory Clinical Findings    Recent Labs: 04/06/2022: TSH 1.27 09/30/2022: ALT 23; BUN 23; Creatinine, Ser 1.52; Hemoglobin 17.7; Platelets 145; Potassium 3.9; Sodium 140   Recent Lipid Panel    Component Value Date/Time   CHOL 179 06/07/2021 1355   CHOL 92 (L) 05/11/2020 1020   TRIG 273.0 (H) 06/07/2021 1355   HDL 36.00 (L) 06/07/2021 1355   HDL 34 (L) 05/11/2020 1020   CHOLHDL 5 06/07/2021 1355   VLDL 54.6 (H) 06/07/2021 1355   LDLCALC 35 05/11/2020 1020   LDLDIRECT 53 10/12/2021 1033   LDLDIRECT 124.0 06/07/2021 1355         ECG personally reviewed by me today-normal  sinus rhythm septal infarct undetermined age possible inferior infarct undetermined age 50 bpm- No acute changes  Echocardiogram 07/14/2021  IMPRESSIONS     1. Frequent PVCs during exam.   2. Left ventricular ejection fraction, by estimation, is 40 to 45% with  beat to beat variability. The left ventricle has mildly decreased  function. The left ventricle demonstrates regional wall motion  abnormalities (see scoring diagram/findings for  description). There is mild left ventricular  hypertrophy. Left ventricular  diastolic parameters are consistent with Grade I diastolic dysfunction  (impaired relaxation).   3. Right ventricular systolic function is mildly reduced. The right  ventricular size is normal. There is normal pulmonary artery systolic  pressure. The estimated right ventricular systolic pressure is 123XX123 mmHg.   4. The mitral valve is grossly normal. Trivial mitral valve  regurgitation. No evidence of mitral stenosis.   5. The aortic valve is grossly normal. There is mild calcification of the  aortic valve. Aortic valve regurgitation is trivial. No aortic stenosis is  present.   6. The inferior vena cava is normal in size with greater than 50%  respiratory variability, suggesting right atrial pressure of 3 mmHg.   FINDINGS   Left Ventricle: Left ventricular ejection fraction, by estimation, is 40  to 45%. The left ventricle has mildly decreased function. The left  ventricle demonstrates regional wall motion abnormalities. The left  ventricular internal cavity size was normal  in size. There is mild left ventricular hypertrophy. Left ventricular  diastolic parameters are consistent with Grade I diastolic dysfunction  (impaired relaxation).     LV Wall Scoring:  The posterior wall and mid anterolateral segment are hypokinetic.   Right Ventricle: The right ventricular size is normal. No increase in  right ventricular wall thickness. Right ventricular systolic function is  mildly reduced. There is normal pulmonary artery systolic pressure. The  tricuspid regurgitant velocity is 2.43  m/s, and with an assumed right atrial pressure of 3 mmHg, the estimated  right ventricular systolic pressure is 123XX123 mmHg.   Left Atrium: Left atrial size was normal in size.   Right Atrium: Right atrial size was normal in size.   Pericardium: There is no evidence of pericardial effusion.   Mitral Valve: The mitral valve is grossly normal. Mild mitral annular   calcification. Trivial mitral valve regurgitation. No evidence of mitral  valve stenosis.   Tricuspid Valve: The tricuspid valve is normal in structure. Tricuspid  valve regurgitation is mild . No evidence of tricuspid stenosis.   Aortic Valve: The aortic valve is grossly normal. There is mild  calcification of the aortic valve. Aortic valve regurgitation is trivial.  No aortic stenosis is present.   Pulmonic Valve: The pulmonic valve was normal in structure. Pulmonic valve  regurgitation is trivial. No evidence of pulmonic stenosis.   Aorta: The aortic root is normal in size and structure.   Venous: The inferior vena cava is normal in size with greater than 50%  respiratory variability, suggesting right atrial pressure of 3 mmHg.   IAS/Shunts: No atrial level shunt detected by color flow Doppler.   EKG: Rhythm strip during this exam demostrated premature ventricular  contractions and sinus bradycardia.    Cardiac catheterization 07/01/2022  1.  Multivessel coronary artery disease with patency of the left main, total occlusion of the left circumflex, severe stenosis of the proximal LAD, and patency of the RCA with mild in-stent restenosis and mild to moderate nonobstructive plaquing in the mid vessel. 2.  Status post  aortocoronary bypass surgery with continued patency of the LIMA to LAD and total occlusion of the saphenous vein graft conduits 3.  Normal right heart pressures including normal wedge pressure   Recommendations: Medical therapy  Diagnostic Dominance: Right  Intervention   Assessment & Plan   1.  Coronary artery disease-no chest pain today.  Status post CABG x 4 in 2022 Continue metoprolol, lisinopril, Imdur, ASA, nitroglycerin as needed Increase physical activity as tolerated  Hyperlipidemia-LDL 53 on 1/23 Continue rosuvastatin Heart healthy low-sodium high-fiber diet Increase physical activity as tolerated   Essential hypertension-BP today  136/78 Continue lisinopril Heart healthy low-sodium diet-salty 6 given Increase physical activity as tolerated  Heart failure with mildly reduced EF-no increased DOE or activity intolerance.  NYHA class I.  Euvolemic.  Weight stable.  Echocardiogram showed EF of 45-50%. Continue Farxiga, lisinopril Daily weights, weight log   Constipation-reports abdominal fullness and hard bowel movements.  Increase fiber in her diet, hydration, and physical activity May use MiraLAX as needed Follow-up with PCP if symptoms do not improve  Preoperative cardiac evaluation-[facility], Dr. Jacalyn Lefevre    Primary Cardiologist: Sherren Mocha, MD  Chart reviewed as part of pre-operative protocol coverage. Given past medical history and time since last visit, based on ACC/AHA guidelines, Barry [name] would be at acceptable risk for the planned procedure without further cardiovascular testing.   His RCRI is a class IV risk, 11% risk of major cardiac event.  He is able to complete greater than 4 METS of physical activity.  His aspirin may be held for 3 days prior to his surgery.  Please resume as soon as hemostasis is achieved.  Patient was advised that if he develops new symptoms prior to surgery to contact our office to arrange a follow-up appointment.  He verbalized understanding.  I will route this recommendation to the requesting party via Epic fax function and remove from pre-op pool.   Disposition: Follow-up with Dr. Burt Knack or APP in 6 months.   Barry Ng. Socorro Ebron NP-C     11/11/2022, 9:18 AM [place] [address] Office [phone] Fax [phone]    I spent 14 minutes examining this patient, reviewing medications, and using patient centered shared decision making involving her cardiac care.  Prior to her visit I spent greater than 20 minutes reviewing her past medical history,  medications, and prior cardiac tests.

## 2022-11-10 ENCOUNTER — Telehealth: Payer: Self-pay | Admitting: Cardiovascular Disease

## 2022-11-10 NOTE — Telephone Encounter (Signed)
Patient's son is requesting an in-person translator be at the appt with patient. 2/16 at 8:50 A.M.

## 2022-11-10 NOTE — Telephone Encounter (Signed)
Spoke with Interpreter services, she verified that they sent the message to the vendor for his appointment. The vendor will not tell us if they are sending an interpreter until later this evening.

## 2022-11-10 NOTE — Telephone Encounter (Signed)
Will forward to NP/Nurse to see if translator can be present.

## 2022-11-11 ENCOUNTER — Encounter: Payer: Self-pay | Admitting: General Practice

## 2022-11-11 ENCOUNTER — Ambulatory Visit: Payer: Medicare Other | Attending: General Practice | Admitting: General Practice

## 2022-11-11 VITALS — BP 136/78 | HR 77 | Ht 65.0 in | Wt 167.2 lb

## 2022-11-11 DIAGNOSIS — I5020 Unspecified systolic (congestive) heart failure: Secondary | ICD-10-CM

## 2022-11-11 DIAGNOSIS — I10 Essential (primary) hypertension: Secondary | ICD-10-CM

## 2022-11-11 DIAGNOSIS — Z01810 Encounter for preprocedural cardiovascular examination: Secondary | ICD-10-CM | POA: Diagnosis not present

## 2022-11-11 DIAGNOSIS — I25119 Atherosclerotic heart disease of native coronary artery with unspecified angina pectoris: Secondary | ICD-10-CM

## 2022-11-11 NOTE — Patient Instructions (Signed)
Medication Instructions:  TAKE MIRALAX FOR THE NEXT FEW DAYS UNTIL YOU BECOME 'REGULAR" THEN AS NEEDED *If you need a refill on your cardiac medications before your next appointment, please call your pharmacy*  Lab Work: NON If you have labs (blood work) drawn today and your tests are completely normal, you will receive your results only by: [place] (if you have MyChart) OR A paper copy in the mail If you have any lab test that is abnormal or we need to change your treatment, we will call you to review the results.  Testing/Procedures: NONE    Follow-Up: At [facility], you and your health needs are our priority.  As part of our continuing mission to provide you with exceptional heart care, we have created designated Provider Care Teams.  These Care Teams include your primary Cardiologist (physician) and Advanced Practice Providers (APPs -  Physician Assistants and Nurse Practitioners) who all work together to provide you with the care you need, when you need it.  We recommend signing up for the patient portal called "MyChart".  Sign up information is provided on this After Visit Summary.  MyChart is used to connect with patients for Virtual Visits (Telemedicine).  Patients are able to view lab/test results, encounter notes, upcoming appointments, etc.  Non-urgent messages can be sent to your provider as well.   To learn more about what you can do with MyChart, go to [email or web address].    Your next appointment:   6 month(s)  Provider:   Sherren Mocha, MD     Other Instructions INCREASE PHYSICAL ACTIVITY AS TOLERATED  INCREASE HYDRATION  PLEASE READ AND FOLLOW INCREASED FIBER DIET-ATTACHED     High-Fiber Eating Plan Fiber, also called dietary fiber, is a type of carbohydrate. It is found foods such as fruits, vegetables, whole grains, and beans. A high-fiber diet can have many health benefits. Your health care provider may recommend a high-fiber diet to  help: Prevent constipation. Fiber can make your bowel movements more regular. Lower your cholesterol. Relieve the following conditions: Inflammation of veins in the anus (hemorrhoids). Inflammation of specific areas of the digestive tract (uncomplicated diverticulosis). A problem of the large intestine, also called the colon, that sometimes causes pain and diarrhea (irritable bowel syndrome, or IBS). Prevent overeating as part of a weight-loss plan. Prevent heart disease, type 2 diabetes, and certain cancers. What are tips for following this plan? Reading food labels  Check the nutrition facts label on food products for the amount of dietary fiber. Choose foods that have 5 grams of fiber or more per serving. The goals for recommended daily fiber intake include: Men (age 31 or younger): 34-38 g. Men (over age 38): 28-34 g. Women (age 59 or younger): 25-28 g. Women (over age 66): 22-25 g. Your daily fiber goal is _____________ g. Shopping Choose whole fruits and vegetables instead of processed forms, such as apple juice or applesauce. Choose a wide variety of high-fiber foods such as avocados, lentils, oats, and kidney beans. Read the nutrition facts label of the foods you choose. Be aware of foods with added fiber. These foods often have high sugar and sodium amounts per serving. Cooking Use whole-grain flour for baking and cooking. Cook with brown rice instead of white rice. Meal planning Start the day with a breakfast that is high in fiber, such as a cereal that contains 5 g of fiber or more per serving. Eat breads and cereals that are made with whole-grain flour instead of refined flour  or white flour. Eat brown rice, bulgur wheat, or millet instead of white rice. Use beans in place of meat in soups, salads, and pasta dishes. Be sure that half of the grains you eat each day are whole grains. General information You can get the recommended daily intake of dietary fiber by: Eating a  variety of fruits, vegetables, grains, nuts, and beans. Taking a fiber supplement if you are not able to take in enough fiber in your diet. It is better to get fiber through food than from a supplement. Gradually increase how much fiber you consume. If you increase your intake of dietary fiber too quickly, you may have bloating, cramping, or gas. Drink plenty of water to help you digest fiber. Choose high-fiber snacks, such as berries, raw vegetables, nuts, and popcorn. What foods should I eat? Fruits Berries. Pears. Apples. Oranges. Avocado. Prunes and raisins. Dried figs. Vegetables Sweet potatoes. Spinach. Kale. Artichokes. Cabbage. Broccoli. Cauliflower. Green peas. Carrots. Squash. Grains Whole-grain breads. Multigrain cereal. Oats and oatmeal. Brown rice. Barley. Bulgur wheat. [place]. Quinoa. Bran muffins. Popcorn. Rye wafer crackers. Meats and other proteins Navy beans, kidney beans, and pinto beans. Soybeans. Split peas. Lentils. Nuts and seeds. Dairy Fiber-fortified yogurt. Beverages Fiber-fortified soy milk. Fiber-fortified orange juice. Other foods Fiber bars. The items listed above may not be a complete list of recommended foods and beverages. Contact a dietitian for more information. What foods should I avoid? Fruits Fruit juice. Cooked, strained fruit. Vegetables Fried potatoes. Canned vegetables. Well-cooked vegetables. Grains White bread. Pasta made with refined flour. White rice. Meats and other proteins Fatty cuts of meat. Fried chicken or fried fish. Dairy Milk. Yogurt. Cream cheese. Sour cream. Fats and oils Butters. Beverages Soft drinks. Other foods Cakes and pastries. The items listed above may not be a complete list of foods and beverages to avoid. Talk with your dietitian about what choices are best for you. Summary Fiber is a type of carbohydrate. It is found in foods such as fruits, vegetables, whole grains, and beans. A high-fiber diet has many  benefits. It can help to prevent constipation, lower blood cholesterol, aid weight loss, and reduce your risk of heart disease, diabetes, and certain cancers. Increase your intake of fiber gradually. Increasing fiber too quickly may cause cramping, bloating, and gas. Drink plenty of water while you increase the amount of fiber you consume. The best sources of fiber include whole fruits and vegetables, whole grains, nuts, seeds, and beans. This information is not intended to replace advice given to you by your health care provider. Make sure you discuss any questions you have with your health care provider. Document Revised: 01/16/2020 Document Reviewed: 01/16/2020 Elsevier Patient Education  [place].

## 2022-11-16 ENCOUNTER — Other Ambulatory Visit: Payer: Self-pay | Admitting: Urology

## 2022-11-16 NOTE — Telephone Encounter (Signed)
Faxed last ov note with clearance instructions to Dr. Jacalyn Lefevre at [facility].

## 2022-11-16 NOTE — Telephone Encounter (Signed)
Office was calling for update. Please advise

## 2022-11-17 NOTE — Progress Notes (Signed)
Talked with son, Pros Avishai. Went over instructions, hx and meds. Instructed to take 1/2 dose of insulin Sunday night with a snack before bedtime. Stopping ASA today (11/17/22). Okay to take beta blocker on Monday morning with a sip of water. And it was ok to take routine meds up until Monday morning. Needs interpreter, called and talked with, Janett Billow who will put in request for Monday at 0600. Pros states that Hoehn (who is an interpreter) goes to all appointments with his dad. She is with [employer]. Arrival time is 0600 and NPO after MN. Janett Billow will return call to verify services of interpreter

## 2022-11-18 NOTE — Progress Notes (Signed)
Spoke with son Pros regarding time change. To arrive to surgery center 0800, NPO after midnight. Reviewed instruction to take half dose of insulin Sunday night and to hold ASA. Son verbalized understanding. Verified interpreter Daryl Eastern) to arrive at [place] with Janett Billow of [employer]. Rolla Flatten aware.

## 2022-11-20 ENCOUNTER — Other Ambulatory Visit: Payer: Self-pay | Admitting: Family Medicine

## 2022-11-21 ENCOUNTER — Ambulatory Visit (HOSPITAL_BASED_OUTPATIENT_CLINIC_OR_DEPARTMENT_OTHER)
Admission: RE | Admit: 2022-11-21 | Discharge: 2022-11-21 | Disposition: A | Discharge status: Home / Self Care | Payer: Medicare Other | Attending: Urology | Admitting: Urology

## 2022-11-21 ENCOUNTER — Ambulatory Visit (HOSPITAL_COMMUNITY): Payer: Medicare Other

## 2022-11-21 ENCOUNTER — Other Ambulatory Visit: Payer: Self-pay

## 2022-11-21 ENCOUNTER — Encounter (HOSPITAL_BASED_OUTPATIENT_CLINIC_OR_DEPARTMENT_OTHER): Payer: Self-pay | Admitting: Urology

## 2022-11-21 ENCOUNTER — Encounter (HOSPITAL_BASED_OUTPATIENT_CLINIC_OR_DEPARTMENT_OTHER)
Admission: RE | Disposition: A | Discharge status: Home / Self Care | Payer: Self-pay | Source: Home / Self Care | Attending: Urology

## 2022-11-21 DIAGNOSIS — Z8673 Personal history of transient ischemic attack (TIA), and cerebral infarction without residual deficits: Secondary | ICD-10-CM | POA: Insufficient documentation

## 2022-11-21 DIAGNOSIS — E119 Type 2 diabetes mellitus without complications: Secondary | ICD-10-CM | POA: Insufficient documentation

## 2022-11-21 DIAGNOSIS — Z955 Presence of coronary angioplasty implant and graft: Secondary | ICD-10-CM | POA: Insufficient documentation

## 2022-11-21 DIAGNOSIS — I10 Essential (primary) hypertension: Secondary | ICD-10-CM | POA: Diagnosis not present

## 2022-11-21 DIAGNOSIS — Z951 Presence of aortocoronary bypass graft: Secondary | ICD-10-CM | POA: Diagnosis not present

## 2022-11-21 DIAGNOSIS — Z7982 Long term (current) use of aspirin: Secondary | ICD-10-CM | POA: Diagnosis not present

## 2022-11-21 DIAGNOSIS — N201 Calculus of ureter: Secondary | ICD-10-CM | POA: Diagnosis not present

## 2022-11-21 DIAGNOSIS — Z7901 Long term (current) use of anticoagulants: Secondary | ICD-10-CM | POA: Diagnosis not present

## 2022-11-21 HISTORY — PX: EXTRACORPOREAL SHOCK WAVE LITHOTRIPSY: SHX1557

## 2022-11-21 LAB — GLUCOSE, CAPILLARY
Glucose-Capillary: 111 mg/dL — ABNORMAL HIGH (ref 70–99)
Glucose-Capillary: 80 mg/dL (ref 70–99)

## 2022-11-21 SURGERY — LITHOTRIPSY, ESWL
Anesthesia: LOCAL | Laterality: Right

## 2022-11-21 MED ORDER — CIPROFLOXACIN HCL 500 MG PO TABS
500.0000 mg | ORAL_TABLET | ORAL | Status: AC
  Administered 2022-11-21: 500 mg via ORAL

## 2022-11-21 MED ORDER — DIPHENHYDRAMINE HCL 25 MG PO CAPS
ORAL_CAPSULE | ORAL | Status: AC
  Filled 2022-11-21: qty 1

## 2022-11-21 MED ORDER — OXYCODONE-ACETAMINOPHEN 5-325 MG PO TABS: 1.0000 | ORAL_TABLET | ORAL | 0 refills | Status: DC | PRN

## 2022-11-21 MED ORDER — CIPROFLOXACIN HCL 500 MG PO TABS
ORAL_TABLET | ORAL | Status: AC
  Filled 2022-11-21: qty 1

## 2022-11-21 MED ORDER — DIPHENHYDRAMINE HCL 25 MG PO CAPS
25.0000 mg | ORAL_CAPSULE | ORAL | Status: AC
  Administered 2022-11-21: 25 mg via ORAL

## 2022-11-21 MED ORDER — DIAZEPAM 5 MG PO TABS
ORAL_TABLET | ORAL | Status: AC
  Filled 2022-11-21: qty 2

## 2022-11-21 MED ORDER — SODIUM CHLORIDE 0.9 % IV SOLN: INTRAVENOUS | Status: DC

## 2022-11-21 MED ORDER — DIAZEPAM 5 MG PO TABS
10.0000 mg | ORAL_TABLET | ORAL | Status: AC
  Administered 2022-11-21: 10 mg via ORAL

## 2022-11-21 NOTE — Op Note (Signed)
[facility] Operative Note  Treating Physician: Ellison Hughs, MD  Pre-op diagnosis: 8 mm right mid-ureteral stone  Post-op diagnosis: Same   Procedure: Right ESWL  See Aris Everts OP note scanned into chart. Also because of the size, density, location and other factors that cannot be anticipated I feel this will likely be a staged procedure. This fact supersedes any indication in the scanned [place] stone operative note to the contrary

## 2022-11-21 NOTE — Discharge Instructions (Signed)
°  Post Anesthesia Home Care Instructions ° °Activity: °Get plenty of rest for the remainder of the day. A responsible individual must stay with you for 24 hours following the procedure.  °For the next 24 hours, DO NOT: °-Drive a car °-Operate machinery °-Drink alcoholic beverages °-Take any medication unless instructed by your physician °-Make any legal decisions or sign important papers. ° °Meals: °Start with liquid foods such as gelatin or soup. Progress to regular foods as tolerated. Avoid greasy, spicy, heavy foods. If nausea and/or vomiting occur, drink only clear liquids until the nausea and/or vomiting subsides. Call your physician if vomiting continues. ° °Special Instructions/Symptoms: °Your throat may feel dry or sore from the anesthesia or the breathing tube placed in your throat during surgery. If this causes discomfort, gargle with warm salt water. The discomfort should disappear within 24 hours. °

## 2022-11-21 NOTE — H&P (Signed)
See scanned [facility] documents for H&P.   

## 2022-11-21 NOTE — Progress Notes (Signed)
Family is ok with discharge instructions being in [place] because the son speaks english. Reviewed instructions with interpreter at bedside

## 2022-11-22 ENCOUNTER — Encounter (HOSPITAL_BASED_OUTPATIENT_CLINIC_OR_DEPARTMENT_OTHER): Payer: Self-pay | Admitting: Urology

## 2022-12-02 NOTE — Progress Notes (Unsigned)
[name] at [facility] [address] [place], [place] [address] [id number] [phone]  Date:  12/05/2022   Name:  Barry [name]   DOB:  07/03/1951   MRN:  [id number]  PCP:  Darreld Mclean, MD    Chief Complaint: No chief complaint on file.   History of Present Illness:  Abhiraj Hammack is a 72 y.o. very pleasant male patient who presents with the following:  Patient here today for periodic follow-up Most recent visit with myself was in November History of CAD status post NSTEMI and stenting x2, CABG x 4 vessels in 2022, chronic superior mesenteric artery dissection followed by vascular surgery, PAD, stroke in 2011, diabetes, hypertension, hyperlipidemia, CKD-his care is also complicated by language barrier and some medical noncompliance/ confusion about medication    Eye exam Shingrix Lab Results  Component Value Date   HGBA1C 8.8 (H) 07/28/2022   I tried to contact him about adding an SGLT2 at last visit but was not able to get in touch-I did prescribe Farxiga 5,?  Is he taking it  Since our last visit he was seen in the ER in January with a left ureteral stone He then was admitted last month for lithotripsy on February 26  He was seen by cardiology for preoperative clearance on February 16 and was approved for surgery  Aspirin 81 Farxiga 5 Insulin glargine Lisinopril 20 Metoprolol 25 twice daily Imdur 30 Crestor 40 Flomax Patient Active Problem List   Diagnosis Date Noted   Abnormal nuclear cardiac imaging test    S/P CABG x 4 09/21/2021   Abnormal nuclear stress test 08/31/2021   PVC's (premature ventricular contractions) 07/21/2021   CKD (chronic kidney disease) stage 3, GFR 30-59 ml/min ([place]) 07/21/2021   HFmrEF (heart failure with mildly reduced EF) 09/2015   Atherosclerotic peripheral vascular disease with ulceration ([place]) 04/03/2015   Aneurysm of iliac artery (HCC) 03/18/2014   Abdominal pain, unspecified site 03/18/2014    Syncope in setting of nausea and vomiting 03/07/14 03/08/2014   Coronary artery disease 02/28/2014   Non compliance with medical treatment 02/28/2014   Dyslipidemia, goal LDL below 70 02/28/2014   Bradycardia- beta blocker decreased 02/28/2014   Chronic diastolic CHF (congestive heart failure) ([place]) 02/28/2014   Type 2 diabetes mellitus with manifestations ([place]) 02/27/2014   TIA (transient ischemic attack) 02/26/2014   History of non-ST elevation myocardial infarction (NSTEMI) 02/26/2014   Weakness-Left arm / Lef 12/06/2013   Mesenteric artery stenosis ([place]) 12/06/2013   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[place]) 12/03/2012   Hypertension 11/10/2011   History of CVA in adulthood 11/10/2011   Language barrier 11/10/2011    Past Medical History:  Diagnosis Date   Anginal pain ([place])    CAD (coronary artery disease)    LHC (02/26/14):  >> PCI:  Xience DES to pRCA // LHC (03/10/14):  >> PCI:  Xience DES to [facility] // Myoview 2/16: low risk // Cath 2016: patent stents; stable dz - med Rx // Myoview 10/22: anterolateral ischemia, inferoapical infarct w peri infarct ischemia; EF 2; High Risk // Cath 12/22: LCx stent occl, severe LAD and Dx dz, diffuse RCA disease >> CABG   Carotid stenosis    a. Carotid US (02/27/14):  Bilateral ICA 1-39%   CKD (chronic kidney disease) stage 3    Depression    Diabetes mellitus    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[place])    a. Mesenteric Artery Duplex (7/15):  pSMA chronic  dissection with aneurysmal dilation of 1.29 cm (VVS);  b.  Chest CTA (02/26/14):  IMPRESSION:  1. No aortic  dissection or other acute abnormality.  2. 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  3. Stable 17 mm ectasia of the left common iliac artery.  4. Atherosclerosis, including aortoiliac and coronary artery disease.    HFmrEF (heart failure with mildly reduced EF) 09/2015   Echo 6/15: EF 50-55 // Echo 1/17: mod LVH, EF 55%, inf-lat HK, Gr 1 DD, mild LAE // Echocardiogram 10/22: EF 40-45, Gr  1 DD, mildly reduced RVSF, RVSP 26.6, trivial MR, trivial AI, post and ant-lat HK   Hyperlipidemia    Hypertension    Iliac artery aneurysm, left (HCC)    Myocardial infarction (HCC)    PVC's (premature ventricular contractions) 07/21/2021   Monitor 10/22:  NSR avg HR 71; no AF/Flutter; no high grade HB or pathologic pauses PVC burden 20%, rare supraventricular beats w/o sustained arrhythmia    Stroke ([place]) 09/26/2009    Past Surgical History:  Procedure Laterality Date   CARDIAC CATHETERIZATION     CARDIAC CATHETERIZATION N/A 06/25/2015   Procedure: Left Heart Cath and Coronary Angiography;  Surgeon: Burnell Blanks, MD;  Location: [facility];  Service: Cardiovascular;  Laterality: N/A;   COLONOSCOPY WITH PROPOFOL N/A 07/23/2015   Procedure: COLONOSCOPY WITH PROPOFOL;  Surgeon: [name], MD;  Location: [facility];  Service: Endoscopy;  Laterality: N/A;   CORONARY ANGIOPLASTY     CORONARY ARTERY BYPASS GRAFT N/A 09/21/2021   Procedure: CORONARY ARTERY BYPASS GRAFTING (CABG)  X FOUR, ON PUMP, USING LEFT INTERNAL MAMMARY ARTERY AND RIGHT ENDOSCOPIC GREATER SAPHENOUS VEIN CONDUITS;  Surgeon: Lajuana Matte, MD;  Location: [facility];  Service: Open Heart Surgery;  Laterality: N/A;   ENDOVEIN HARVEST OF GREATER SAPHENOUS VEIN  09/21/2021   Procedure: ENDOVEIN HARVEST OF GREATER SAPHENOUS VEIN;  Surgeon: Lajuana Matte, MD;  Location: [facility];  Service: Open Heart Surgery;;   EXTRACORPOREAL SHOCK WAVE LITHOTRIPSY Right 11/21/2022   Procedure: EXTRACORPOREAL SHOCK WAVE LITHOTRIPSY ([facility]);  Surgeon: Ceasar Mons, MD;  Location[facility];  Service: Urology;  Laterality: Right;   LEFT HEART CATH AND CORONARY ANGIOGRAPHY N/A 08/31/2021   Procedure: LEFT HEART CATH AND CORONARY ANGIOGRAPHY;  Surgeon: Belva Crome, MD;  Location: [facility];  Service: Cardiovascular;  Laterality: N/A;   LEFT HEART CATHETERIZATION WITH CORONARY ANGIOGRAM  N/A 02/26/2014   Procedure: LEFT HEART CATHETERIZATION WITH CORONARY ANGIOGRAM;  Surgeon: Wellington Hampshire, MD;  Location: [facility];  Service: Cardiovascular;  Laterality: N/A;   LEFT HEART CATHETERIZATION WITH CORONARY ANGIOGRAM N/A 03/10/2014   Procedure: LEFT HEART CATHETERIZATION WITH CORONARY ANGIOGRAM;  Surgeon: Jettie Booze, MD;  Location: [facility];  Service: Cardiovascular;  Laterality: N/A;   RIGHT HEART CATH AND CORONARY/GRAFT ANGIOGRAPHY N/A 07/01/2022   Procedure: RIGHT HEART CATH AND CORONARY/GRAFT ANGIOGRAPHY;  Surgeon: Sherren Mocha, MD;  Location: [place] [facility];  Service: Cardiovascular;  Laterality: N/A;   TEE WITHOUT CARDIOVERSION N/A 09/21/2021   Procedure: TRANSESOPHAGEAL ECHOCARDIOGRAM (TEE);  Surgeon: Lajuana Matte, MD;  Location: [facility];  Service: Open Heart Surgery;  Laterality: N/A;    Social History   Tobacco Use   Smoking status: Former    Packs/day: 0.25    Years: 38.00    Total pack years: 9.50    Types: Cigarettes    Quit date: 2008    Years since quitting: 33.1  Smokeless tobacco: Never  Vaping Use   Vaping Use: Never used  Substance Use Topics   Alcohol use: No    Alcohol/week: 0.0 standard drinks of alcohol   Drug use: No    Family History  Problem Relation Age of Onset   Diabetes Father    Hypertension Father    Heart attack Father    Stroke Neg Hx     Allergies  Allergen Reactions   Metformin And Related     Pt feels that this causes constipation and will not take     Medication list has been reviewed and updated.  Current Outpatient Medications on File Prior to Visit  Medication Sig Dispense Refill   acetaminophen (TYLENOL) 325 MG tablet Take 2 tablets (650 mg total) by mouth every 6 (six) hours as needed for mild pain, fever or headache.     aspirin 81 MG EC tablet Take 1 tablet (81 mg total) by mouth daily.     blood glucose meter kit and supplies Dispense based on patient and insurance preference. Use up to  four times daily as directed. (FOR ICD-10 E10.9, E11.9). 1 each 0   dapagliflozin propanediol (FARXIGA) 5 MG TABS tablet Take 1 tablet (5 mg total) by mouth daily before breakfast. 90 tablet 3   gabapentin (NEURONTIN) 300 MG capsule Take 300 mg by mouth as needed (pain).     glucose blood (ONETOUCH ULTRA) test strip USE  STRIP TO CHECK GLUCOSE TWICE DAILY 200 each 12   Insulin Pen Needle (TECHLITE PEN NEEDLES) 32G X 8 MM MISC Use daily to administer insulin 100 each 3   isosorbide mononitrate (IMDUR) 30 MG 24 hr tablet Take 1 tablet (30 mg total) by mouth daily. You can take an additional half tablet as needed for chest pain 45 tablet 1   Lancets (ONETOUCH DELICA PLUS 123XX123) MISC Check blood sugars twice daily 200 each 12   LANTUS 100 UNIT/ML injection INJECT 22 TO 27 UNITS SUBCUTANEOUSLY ONCE DAILY 10 mL 0   lisinopril (ZESTRIL) 20 MG tablet Take 20 mg by mouth daily. Patient taking 1/2 tablet daily     metoprolol tartrate (LOPRESSOR) 25 MG tablet Take 1 tablet (25 mg total) by mouth 2 (two) times daily. 180 tablet 3   morphine (MSIR) 15 MG tablet Take 0.5 tablets (7.5 mg total) by mouth every 4 (four) hours as needed for severe pain. 7 tablet 0   nitroGLYCERIN (NITROSTAT) 0.4 MG SL tablet Place 1 tablet (0.4 mg total) under the tongue every 5 (five) minutes as needed for chest pain. 50 tablet 3   ofloxacin (FLOXIN OTIC) 0.3 % OTIC solution Place 10 drops into the right ear daily. Use for 7- 10 days as needed 5 mL 1   ondansetron (ZOFRAN-ODT) 4 MG disintegrating tablet '4mg'$  ODT q4 hours prn nausea/vomit 20 tablet 0   oxyCODONE-acetaminophen (PERCOCET) 5-325 MG tablet Take 1 tablet by mouth every 4 (four) hours as needed for severe pain. 20 tablet 0   rosuvastatin (CRESTOR) 40 MG tablet Take 1 tablet (40 mg total) by mouth daily. 90 tablet 3   tamsulosin (FLOMAX) 0.4 MG CAPS capsule Take 1 capsule (0.4 mg total) by mouth daily after supper. 30 capsule 0   No current facility-administered  medications on file prior to visit.    Review of Systems:  As per HPI- otherwise negative.   Physical Examination: There were no vitals filed for this visit. There were no vitals filed for this visit. There is no height  or weight on file to calculate BMI. Ideal Body Weight:    GEN: no acute distress. HEENT: Atraumatic, Normocephalic.  Ears and Nose: No external deformity. CV: RRR, No M/G/R. No JVD. No thrill. No extra heart sounds. PULM: CTA B, no wheezes, crackles, rhonchi. No retractions. No resp. distress. No accessory muscle use. ABD: S, NT, ND, +BS. No rebound. No HSM. EXTR: No c/c/e PSYCH: Normally interactive. Conversant.    Assessment and Plan: ***  Signed Lamar Blinks, MD

## 2022-12-05 ENCOUNTER — Ambulatory Visit (HOSPITAL_BASED_OUTPATIENT_CLINIC_OR_DEPARTMENT_OTHER)
Admission: RE | Admit: 2022-12-05 | Discharge: 2022-12-05 | Disposition: A | Discharge status: Home / Self Care | Payer: Medicare Other | Source: Ambulatory Visit | Attending: Family Medicine | Admitting: Family Medicine

## 2022-12-05 ENCOUNTER — Ambulatory Visit (INDEPENDENT_AMBULATORY_CARE_PROVIDER_SITE_OTHER): Payer: Medicare Other | Admitting: Family Medicine

## 2022-12-05 VITALS — BP 130/80 | HR 68 | Temp 97.7°F | Resp 18 | Ht 65.0 in | Wt 167.6 lb

## 2022-12-05 DIAGNOSIS — M545 Low back pain, unspecified: Secondary | ICD-10-CM

## 2022-12-05 DIAGNOSIS — R140 Abdominal distension (gaseous): Secondary | ICD-10-CM

## 2022-12-05 DIAGNOSIS — N1832 Chronic kidney disease, stage 3b: Secondary | ICD-10-CM | POA: Diagnosis not present

## 2022-12-05 DIAGNOSIS — M79604 Pain in right leg: Secondary | ICD-10-CM | POA: Insufficient documentation

## 2022-12-05 DIAGNOSIS — E118 Type 2 diabetes mellitus with unspecified complications: Secondary | ICD-10-CM

## 2022-12-05 DIAGNOSIS — E785 Hyperlipidemia, unspecified: Secondary | ICD-10-CM | POA: Diagnosis not present

## 2022-12-05 NOTE — Patient Instructions (Signed)
Good to see you today- I will be in touch with your x-rays and labs asap Recommend getting the shingles vaccine series- Shingrix- at your pharmacy

## 2022-12-06 LAB — LIPID PANEL
Cholesterol: 148 mg/dL (ref 0–200)
HDL: 29.5 mg/dL — ABNORMAL LOW (ref >39.00)
NonHDL: 118.2
Total CHOL/HDL Ratio: 5
Triglycerides: 250 mg/dL — ABNORMAL HIGH (ref 0.0–149.0)
VLDL: 50 mg/dL — ABNORMAL HIGH (ref 0.0–40.0)

## 2022-12-06 LAB — COMPREHENSIVE METABOLIC PANEL
ALT: 15 U/L (ref 0–53)
AST: 16 U/L (ref 0–37)
Albumin: 3.9 g/dL (ref 3.5–5.2)
Alkaline Phosphatase: 86 U/L (ref 39–117)
BUN: 20 mg/dL (ref 6–23)
CO2: 31 mEq/L (ref 19–32)
Calcium: 9.7 mg/dL (ref 8.4–10.5)
Chloride: 99 mEq/L (ref 96–112)
Creatinine, Ser: 1.63 mg/dL — ABNORMAL HIGH (ref 0.40–1.50)
GFR: 41.73 mL/min — ABNORMAL LOW (ref >60.00)
Glucose, Bld: 427 mg/dL — ABNORMAL HIGH (ref 70–99)
Potassium: 4.3 mEq/L (ref 3.5–5.1)
Sodium: 138 mEq/L (ref 135–145)
Total Bilirubin: 0.5 mg/dL (ref 0.2–1.2)
Total Protein: 6.4 g/dL (ref 6.0–8.3)

## 2022-12-06 LAB — CBC
HCT: 46.4 % (ref 39.0–52.0)
Hemoglobin: 15.1 g/dL (ref 13.0–17.0)
MCHC: 32.5 g/dL (ref 30.0–36.0)
MCV: 86.5 fl (ref 78.0–100.0)
Platelets: 155 10*3/uL (ref 150.0–400.0)
RBC: 5.36 Mil/uL (ref 4.22–5.81)
RDW: 14 % (ref 11.5–15.5)
WBC: 6.7 10*3/uL (ref 4.0–10.5)

## 2022-12-06 LAB — HEMOGLOBIN A1C: Hgb A1c MFr Bld: 10.2 % — ABNORMAL HIGH (ref 4.6–6.5)

## 2022-12-06 LAB — LDL CHOLESTEROL, DIRECT: Direct LDL: 94 mg/dL

## 2022-12-07 ENCOUNTER — Telehealth: Payer: Self-pay | Admitting: Family Medicine

## 2022-12-07 NOTE — Telephone Encounter (Signed)
Patient's son called to let Dr. Lorelei Pont know that his dad is taking 27 mgs of insulin. He states this was regarding a call they had yesterday.

## 2022-12-07 NOTE — Telephone Encounter (Signed)
See below

## 2022-12-08 NOTE — Addendum Note (Signed)
Addended by: Lamar Blinks C on: 12/08/2022 04:37 PM   Modules accepted: Orders

## 2022-12-12 DIAGNOSIS — N201 Calculus of ureter: Secondary | ICD-10-CM | POA: Diagnosis not present

## 2022-12-29 ENCOUNTER — Encounter: Payer: Self-pay | Admitting: Family Medicine

## 2022-12-29 ENCOUNTER — Ambulatory Visit (INDEPENDENT_AMBULATORY_CARE_PROVIDER_SITE_OTHER): Payer: Medicare Other | Admitting: Family Medicine

## 2022-12-29 VITALS — BP 162/93 | HR 86 | Temp 99.6°F | Ht 65.0 in | Wt 167.0 lb

## 2022-12-29 DIAGNOSIS — M5442 Lumbago with sciatica, left side: Secondary | ICD-10-CM

## 2022-12-29 DIAGNOSIS — M5136 Other intervertebral disc degeneration, lumbar region: Secondary | ICD-10-CM | POA: Diagnosis not present

## 2022-12-29 DIAGNOSIS — Z87442 Personal history of urinary calculi: Secondary | ICD-10-CM

## 2022-12-29 DIAGNOSIS — M5441 Lumbago with sciatica, right side: Secondary | ICD-10-CM | POA: Diagnosis not present

## 2022-12-29 DIAGNOSIS — G8929 Other chronic pain: Secondary | ICD-10-CM | POA: Diagnosis not present

## 2022-12-29 DIAGNOSIS — M533 Sacrococcygeal disorders, not elsewhere classified: Secondary | ICD-10-CM

## 2022-12-29 LAB — POC URINALSYSI DIPSTICK (AUTOMATED)
Bilirubin, UA: NEGATIVE
Glucose, UA: POSITIVE — AB
Ketones, UA: NEGATIVE
Leukocytes, UA: NEGATIVE
Nitrite, UA: NEGATIVE
Protein, UA: NEGATIVE
Spec Grav, UA: 1.015 (ref 1.010–1.025)
Urobilinogen, UA: 1 E.U./dL
pH, UA: 6 (ref 5.0–8.0)

## 2022-12-29 NOTE — Progress Notes (Signed)
OFFICE VISIT  12/29/2022  CC:  Chief Complaint  Patient presents with   Back Pain    2 weeks; pt had kidney stones found more 12/05/22 with [facility] does not believe all stones are gone.    Patient is a 72 y.o. male who presents accompanied by his son for back pain.  HPI: Patient's son interprets. Has had pain in the right low back, hard to tell how long but sounds like a month or 2. Sometimes extends over the low back/sacral area diffusely and down the legs briefly.  No weakness, no paresthesias. Not having CVA/flank pain, no abdominal pain.  No nausea, no blood in urine.  No fever.  He is worried that he may still have a kidney stone. He had a R ureteral stone 37mm on CT 09/2022.  ESWL 11/21/22. ABD radiographs 12/05/22--->"Residual tiny calcifications in the region of the right mid ureter at the level of L3-L4, measuring 1-2 mm, previously up to 9-10 mm on 11/21/2022".  He has been taking a few oxycodone recently.  ROS as above, plus-->no CP, no SOB, no wheezing, no cough, no dizziness, no HAs, no rashes, no melena/hematochezia.  No polyuria or polydipsia.  No myalgias or arthralgias.  No focal weakness, paresthesias, or tremors.  No acute vision or hearing abnormalities.  No dysuria or unusual/new urinary urgency or frequency.  No recent changes in lower legs.   No palpitations.     Past Medical History:  Diagnosis Date   Anginal pain    CAD (coronary artery disease)    LHC (02/26/14):  >> PCI:  Xience DES to pRCA // LHC (03/10/14):  >> PCI:  Xience DES to [facility] // Myoview 2/16: low risk // Cath 2016: patent stents; stable dz - med Rx // Myoview 10/22: anterolateral ischemia, inferoapical infarct w peri infarct ischemia; EF 41; High Risk // Cath 12/22: LCx stent occl, severe LAD and Dx dz, diffuse RCA disease >> CABG   Carotid stenosis    a. Carotid US (02/27/14):  Bilateral ICA 1-39%   CKD (chronic kidney disease) stage 3    Depression    Diabetes mellitus    Dissection of mesenteric  artery    a. Mesenteric Artery Duplex (7/15):  pSMA chronic dissection with aneurysmal dilation of 1.29 cm (VVS);  b.  Chest CTA (02/26/14):  IMPRESSION:  1. No aortic  dissection or other acute abnormality.  2. 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  3. Stable 17 mm ectasia of the left common iliac artery.  4. Atherosclerosis, including aortoiliac and coronary artery disease.    HFmrEF (heart failure with mildly reduced EF) 09/2015   Echo 6/15: EF 50-55 // Echo 1/17: mod LVH, EF 55%, inf-lat HK, Gr 1 DD, mild LAE // Echocardiogram 10/22: EF 40-45, Gr 1 DD, mildly reduced RVSF, RVSP 26.6, trivial MR, trivial AI, post and ant-lat HK   Hyperlipidemia    Hypertension    Iliac artery aneurysm, left    Myocardial infarction    PVC's (premature ventricular contractions) 07/21/2021   Monitor 10/22:  NSR avg HR 71; no AF/Flutter; no high grade HB or pathologic pauses PVC burden 20%, rare supraventricular beats w/o sustained arrhythmia    Stroke 09/26/2009    Past Surgical History:  Procedure Laterality Date   CARDIAC CATHETERIZATION     CARDIAC CATHETERIZATION N/A 06/25/2015   Procedure: Left Heart Cath and Coronary Angiography;  Surgeon: Burnell Blanks, MD;  Location: [facility];  Service: Cardiovascular;  Laterality: N/A;  COLONOSCOPY WITH PROPOFOL N/A 07/23/2015   Procedure: COLONOSCOPY WITH PROPOFOL;  Surgeon: [name], MD;  Location: [facility];  Service: Endoscopy;  Laterality: N/A;   CORONARY ANGIOPLASTY     CORONARY ARTERY BYPASS GRAFT N/A 09/21/2021   Procedure: CORONARY ARTERY BYPASS GRAFTING (CABG)  X FOUR, ON PUMP, USING LEFT INTERNAL MAMMARY ARTERY AND RIGHT ENDOSCOPIC GREATER SAPHENOUS VEIN CONDUITS;  Surgeon: Lajuana Matte, MD;  Location: [facility];  Service: Open Heart Surgery;  Laterality: N/A;   ENDOVEIN HARVEST OF GREATER SAPHENOUS VEIN  09/21/2021   Procedure: ENDOVEIN HARVEST OF GREATER SAPHENOUS VEIN;  Surgeon: Lajuana Matte, MD;   Location: [facility];  Service: Open Heart Surgery;;   EXTRACORPOREAL SHOCK WAVE LITHOTRIPSY Right 11/21/2022   Procedure: EXTRACORPOREAL SHOCK WAVE LITHOTRIPSY (ESWL);  Surgeon: Ceasar Mons, MD;  Location: [facility];  Service: Urology;  Laterality: Right;   LEFT HEART CATH AND CORONARY ANGIOGRAPHY N/A 08/31/2021   Procedure: LEFT HEART CATH AND CORONARY ANGIOGRAPHY;  Surgeon: Belva Crome, MD;  Location: [facility];  Service: Cardiovascular;  Laterality: N/A;   LEFT HEART CATHETERIZATION WITH CORONARY ANGIOGRAM N/A 02/26/2014   Procedure: LEFT HEART CATHETERIZATION WITH CORONARY ANGIOGRAM;  Surgeon: Wellington Hampshire, MD;  Location: [facility];  Service: Cardiovascular;  Laterality: N/A;   LEFT HEART CATHETERIZATION WITH CORONARY ANGIOGRAM N/A 03/10/2014   Procedure: LEFT HEART CATHETERIZATION WITH CORONARY ANGIOGRAM;  Surgeon: Jettie Booze, MD;  Location: [facility];  Service: Cardiovascular;  Laterality: N/A;   RIGHT HEART CATH AND CORONARY/GRAFT ANGIOGRAPHY N/A 07/01/2022   Procedure: RIGHT HEART CATH AND CORONARY/GRAFT ANGIOGRAPHY;  Surgeon: Sherren Mocha, MD;  Location: [facility];  Service: Cardiovascular;  Laterality: N/A;   TEE WITHOUT CARDIOVERSION N/A 09/21/2021   Procedure: TRANSESOPHAGEAL ECHOCARDIOGRAM (TEE);  Surgeon: Lajuana Matte, MD;  Location: [facility];  Service: Open Heart Surgery;  Laterality: N/A;    Outpatient Medications Prior to Visit  Medication Sig Dispense Refill   acetaminophen (TYLENOL) 325 MG tablet Take 2 tablets (650 mg total) by mouth every 6 (six) hours as needed for mild pain, fever or headache.     aspirin 81 MG EC tablet Take 1 tablet (81 mg total) by mouth daily.     blood glucose meter kit and supplies Dispense based on patient and insurance preference. Use up to four times daily as directed. (FOR ICD-10 E10.9, E11.9). 1 each 0   dapagliflozin propanediol (FARXIGA) 5 MG TABS tablet Take 1 tablet (5 mg  total) by mouth daily before breakfast. 90 tablet 3   gabapentin (NEURONTIN) 300 MG capsule Take 300 mg by mouth as needed (pain).     glucose blood (ONETOUCH ULTRA) test strip USE  STRIP TO CHECK GLUCOSE TWICE DAILY 200 each 12   Insulin Pen Needle (TECHLITE PEN NEEDLES) 32G X 8 MM MISC Use daily to administer insulin 100 each 3   isosorbide mononitrate (IMDUR) 30 MG 24 hr tablet Take 1 tablet (30 mg total) by mouth daily. You can take an additional half tablet as needed for chest pain 45 tablet 1   Lancets (ONETOUCH DELICA PLUS [id number]) MISC Check blood sugars twice daily 200 each 12   LANTUS 100 UNIT/ML injection INJECT 22 TO 27 UNITS SUBCUTANEOUSLY ONCE DAILY 10 mL 0   lisinopril (ZESTRIL) 20 MG tablet Take 20 mg by mouth daily. Patient taking 1/2 tablet daily     metoprolol tartrate (LOPRESSOR) 25 MG tablet Take 1 tablet (25 mg  total) by mouth 2 (two) times daily. 180 tablet 3   morphine (MSIR) 15 MG tablet Take 0.5 tablets (7.5 mg total) by mouth every 4 (four) hours as needed for severe pain. 7 tablet 0   nitroGLYCERIN (NITROSTAT) 0.4 MG SL tablet Place 1 tablet (0.4 mg total) under the tongue every 5 (five) minutes as needed for chest pain. 50 tablet 3   ofloxacin (FLOXIN OTIC) 0.3 % OTIC solution Place 10 drops into the right ear daily. Use for 7- 10 days as needed 5 mL 1   ondansetron (ZOFRAN-ODT) 4 MG disintegrating tablet 4mg  ODT q4 hours prn nausea/vomit 20 tablet 0   oxyCODONE-acetaminophen (PERCOCET) 5-325 MG tablet Take 1 tablet by mouth every 4 (four) hours as needed for severe pain. 20 tablet 0   rosuvastatin (CRESTOR) 40 MG tablet Take 1 tablet (40 mg total) by mouth daily. 90 tablet 3   tamsulosin (FLOMAX) 0.4 MG CAPS capsule Take 1 capsule (0.4 mg total) by mouth daily after supper. (Patient not taking: Reported on 12/29/2022) 30 capsule 0   No facility-administered medications prior to visit.    Allergies  Allergen Reactions   Metformin And Related     Pt feels that this  causes constipation and will not take     Review of Systems  As per HPI  PE:    12/29/2022    1:20 PM 12/29/2022    1:08 PM 12/05/2022    1:53 PM  Vitals with BMI  Height  5\' 5"  5\' 5"   Weight  167 lbs 167 lbs 10 oz  BMI  [id number]  Systolic 0000000 [id number]  Diastolic 93 92 80  Pulse  86 68     Physical Exam  Gen: Alert, well appearing.  Patient is oriented to person, place, time, and situation. AFFECT: pleasant, lucid thought and speech. CV: RRR, no m/r/g.   LUNGS: CTA bilat, nonlabored resps, good aeration in all lung fields. BACK: no CVA tenderness, side tenderness, or abd tenderness. No bruising.  Pain with lumbar extension and lateral bending and rotation. Mild TTP over R posterior iliac region extending into SI region. Supine SLR neg bilat.  LE strength 5/5 prox and dist.   LABS:  Last CBC Lab Results  Component Value Date   WBC 6.7 12/05/2022   HGB 15.1 12/05/2022   HCT 46.4 12/05/2022   MCV 86.5 12/05/2022   MCH 28.6 09/30/2022   RDW 14.0 12/05/2022   PLT 155.0 0000000   Last metabolic panel Lab Results  Component Value Date   GLUCOSE 427 (H) 12/05/2022   NA 138 12/05/2022   K 4.3 12/05/2022   CL 99 12/05/2022   CO2 31 12/05/2022   BUN 20 12/05/2022   CREATININE 1.63 (H) 12/05/2022   GFRNONAA 48 (L) 09/30/2022   CALCIUM 9.7 12/05/2022   PROT 6.4 12/05/2022   ALBUMIN 3.9 12/05/2022   LABGLOB 2.7 10/12/2021   AGRATIO 1.6 10/12/2021   BILITOT 0.5 12/05/2022   ALKPHOS 86 12/05/2022   AST 16 12/05/2022   ALT 15 12/05/2022   ANIONGAP 11 09/30/2022   CC UA today: trace blood, 3+ gluc, o/w normal  Lumbar spine radiograph 12/05/2022 showed mild to moderate multilevel degenerative disc disease.  IMPRESSION AND PLAN:  Right lumbosacral pain.   Low suspicion that this has anything to do with recent renal/ureteral stone. Reassured.  Recommended physical therapy, ordered today.  He has Percocet 5/325 to take 1 every 4 hour as needed. Not NSAID  candidate due to  chronic renal insufficiency.  An After Visit Summary was printed and given to the patient.  FOLLOW UP: Return in about 6 weeks (around 02/09/2023) for with PCP to f/u back pain.  Signed:  Crissie Sickles, MD           12/29/2022

## 2023-01-06 ENCOUNTER — Ambulatory Visit: Payer: Medicare Other | Admitting: Physician Assistant

## 2023-01-23 ENCOUNTER — Other Ambulatory Visit (HOSPITAL_BASED_OUTPATIENT_CLINIC_OR_DEPARTMENT_OTHER): Payer: Self-pay | Admitting: Family

## 2023-01-23 NOTE — Telephone Encounter (Signed)
Patient of Dr. Cooper. Please review for refill. Thank you!  

## 2023-01-26 DIAGNOSIS — I129 Hypertensive chronic kidney disease with stage 1 through stage 4 chronic kidney disease, or unspecified chronic kidney disease: Secondary | ICD-10-CM | POA: Diagnosis not present

## 2023-01-26 DIAGNOSIS — I2510 Atherosclerotic heart disease of native coronary artery without angina pectoris: Secondary | ICD-10-CM | POA: Diagnosis not present

## 2023-01-26 DIAGNOSIS — N1832 Chronic kidney disease, stage 3b: Secondary | ICD-10-CM | POA: Diagnosis not present

## 2023-01-30 ENCOUNTER — Telehealth: Payer: Self-pay | Admitting: Family Medicine

## 2023-01-30 NOTE — Telephone Encounter (Signed)
Daughter in law, Shanda Bumps called to see if pt can get a referral/prescription for a Rollator. Pt is having mobility issues. They would like to try though insurance first if possible. Shanda Bumps wasn't sure if we have a DME company they we typically use. Please call [phone]

## 2023-01-31 NOTE — Telephone Encounter (Signed)
Pt was seen on 4/424 by Dr. Milinda Cave For Sacroiliac Pain, Chronic right-sided low back pain with bilateral sciatica, and DDD (degenerative disc disease), lumbar. Ref was placed to PT. Advised to f/u in 6 weeks with PCP.   Okay for RX?

## 2023-01-31 NOTE — Telephone Encounter (Signed)
Letter placed in folder for signature, will fax to [facility].

## 2023-02-03 ENCOUNTER — Telehealth: Payer: Self-pay | Admitting: Family Medicine

## 2023-02-03 ENCOUNTER — Other Ambulatory Visit: Payer: Self-pay | Admitting: Family Medicine

## 2023-02-03 DIAGNOSIS — N200 Calculus of kidney: Secondary | ICD-10-CM | POA: Diagnosis not present

## 2023-02-03 NOTE — Telephone Encounter (Signed)
Referral was sent to [facility] and actually [employer] is in network, please fax over to [employer].

## 2023-02-03 NOTE — Telephone Encounter (Signed)
Referral was faxed to [employer] [facility].

## 2023-02-06 ENCOUNTER — Telehealth: Payer: Self-pay | Admitting: Family Medicine

## 2023-02-06 NOTE — Telephone Encounter (Signed)
Rosey Bath with [employer] called to request height and weight of patient as well as face to face notes stating need for the rollator walker. Please fax information/documents to [phone]

## 2023-02-07 NOTE — Telephone Encounter (Signed)
This has been faxed.

## 2023-02-10 ENCOUNTER — Encounter (HOSPITAL_COMMUNITY): Payer: Self-pay

## 2023-02-10 ENCOUNTER — Emergency Department (HOSPITAL_COMMUNITY): Payer: Medicare Other

## 2023-02-10 ENCOUNTER — Other Ambulatory Visit: Payer: Self-pay

## 2023-02-10 ENCOUNTER — Emergency Department (HOSPITAL_COMMUNITY)
Admission: EM | Admit: 2023-02-10 | Discharge: 2023-02-10 | Disposition: A | Discharge status: Home / Self Care | Payer: Medicare Other | Attending: Emergency Medicine | Admitting: Emergency Medicine

## 2023-02-10 DIAGNOSIS — N132 Hydronephrosis with renal and ureteral calculous obstruction: Secondary | ICD-10-CM | POA: Insufficient documentation

## 2023-02-10 DIAGNOSIS — Z7982 Long term (current) use of aspirin: Secondary | ICD-10-CM | POA: Diagnosis not present

## 2023-02-10 DIAGNOSIS — Z7984 Long term (current) use of oral hypoglycemic drugs: Secondary | ICD-10-CM | POA: Insufficient documentation

## 2023-02-10 DIAGNOSIS — Z955 Presence of coronary angioplasty implant and graft: Secondary | ICD-10-CM | POA: Diagnosis not present

## 2023-02-10 DIAGNOSIS — I130 Hypertensive heart and chronic kidney disease with heart failure and stage 1 through stage 4 chronic kidney disease, or unspecified chronic kidney disease: Secondary | ICD-10-CM | POA: Diagnosis not present

## 2023-02-10 DIAGNOSIS — I6789 Other cerebrovascular disease: Secondary | ICD-10-CM | POA: Diagnosis not present

## 2023-02-10 DIAGNOSIS — S0990XA Unspecified injury of head, initial encounter: Secondary | ICD-10-CM | POA: Diagnosis not present

## 2023-02-10 DIAGNOSIS — R404 Transient alteration of awareness: Secondary | ICD-10-CM | POA: Diagnosis not present

## 2023-02-10 DIAGNOSIS — R739 Hyperglycemia, unspecified: Secondary | ICD-10-CM | POA: Diagnosis not present

## 2023-02-10 DIAGNOSIS — I2510 Atherosclerotic heart disease of native coronary artery without angina pectoris: Secondary | ICD-10-CM | POA: Insufficient documentation

## 2023-02-10 DIAGNOSIS — Y92002 Bathroom of unspecified non-institutional (private) residence single-family (private) house as the place of occurrence of the external cause: Secondary | ICD-10-CM | POA: Diagnosis not present

## 2023-02-10 DIAGNOSIS — N189 Chronic kidney disease, unspecified: Secondary | ICD-10-CM | POA: Diagnosis not present

## 2023-02-10 DIAGNOSIS — I10 Essential (primary) hypertension: Secondary | ICD-10-CM | POA: Diagnosis not present

## 2023-02-10 DIAGNOSIS — N1330 Unspecified hydronephrosis: Secondary | ICD-10-CM

## 2023-02-10 DIAGNOSIS — M5441 Lumbago with sciatica, right side: Secondary | ICD-10-CM | POA: Diagnosis not present

## 2023-02-10 DIAGNOSIS — N134 Hydroureter: Secondary | ICD-10-CM | POA: Diagnosis not present

## 2023-02-10 DIAGNOSIS — E1165 Type 2 diabetes mellitus with hyperglycemia: Secondary | ICD-10-CM | POA: Insufficient documentation

## 2023-02-10 DIAGNOSIS — Z79899 Other long term (current) drug therapy: Secondary | ICD-10-CM | POA: Insufficient documentation

## 2023-02-10 DIAGNOSIS — R6889 Other general symptoms and signs: Secondary | ICD-10-CM | POA: Diagnosis not present

## 2023-02-10 DIAGNOSIS — M461 Sacroiliitis, not elsewhere classified: Secondary | ICD-10-CM | POA: Diagnosis not present

## 2023-02-10 DIAGNOSIS — I509 Heart failure, unspecified: Secondary | ICD-10-CM | POA: Diagnosis not present

## 2023-02-10 DIAGNOSIS — J381 Polyp of vocal cord and larynx: Secondary | ICD-10-CM | POA: Diagnosis not present

## 2023-02-10 DIAGNOSIS — S299XXA Unspecified injury of thorax, initial encounter: Secondary | ICD-10-CM | POA: Diagnosis not present

## 2023-02-10 DIAGNOSIS — W19XXXA Unspecified fall, initial encounter: Secondary | ICD-10-CM | POA: Diagnosis not present

## 2023-02-10 DIAGNOSIS — Z043 Encounter for examination and observation following other accident: Secondary | ICD-10-CM | POA: Diagnosis not present

## 2023-02-10 DIAGNOSIS — R109 Unspecified abdominal pain: Secondary | ICD-10-CM | POA: Diagnosis not present

## 2023-02-10 DIAGNOSIS — Z743 Need for continuous supervision: Secondary | ICD-10-CM | POA: Diagnosis not present

## 2023-02-10 DIAGNOSIS — J9811 Atelectasis: Secondary | ICD-10-CM | POA: Diagnosis not present

## 2023-02-10 DIAGNOSIS — M47812 Spondylosis without myelopathy or radiculopathy, cervical region: Secondary | ICD-10-CM | POA: Diagnosis not present

## 2023-02-10 DIAGNOSIS — Z794 Long term (current) use of insulin: Secondary | ICD-10-CM | POA: Diagnosis not present

## 2023-02-10 LAB — TROPONIN I (HIGH SENSITIVITY)
Troponin I (High Sensitivity): 69 ng/L — ABNORMAL HIGH (ref <18)
Troponin I (High Sensitivity): 65 ng/L — ABNORMAL HIGH (ref <18)

## 2023-02-10 LAB — CBC WITH DIFFERENTIAL/PLATELET
Abs Immature Granulocytes: 0.04 10*3/uL (ref 0.00–0.07)
Basophils Absolute: 0 10*3/uL (ref 0.0–0.1)
Basophils Relative: 1 %
Eosinophils Absolute: 0.5 10*3/uL (ref 0.0–0.5)
Eosinophils Relative: 8 %
HCT: 48.1 % (ref 39.0–52.0)
Hemoglobin: 15.5 g/dL (ref 13.0–17.0)
Immature Granulocytes: 1 %
Lymphocytes Relative: 25 %
Lymphs Abs: 1.6 10*3/uL (ref 0.7–4.0)
MCH: 27.6 pg (ref 26.0–34.0)
MCHC: 32.2 g/dL (ref 30.0–36.0)
MCV: 85.7 fL (ref 80.0–100.0)
Monocytes Absolute: 0.6 10*3/uL (ref 0.1–1.0)
Monocytes Relative: 8 %
Neutro Abs: 3.9 10*3/uL (ref 1.7–7.7)
Neutrophils Relative %: 57 %
Platelets: 132 10*3/uL — ABNORMAL LOW (ref 150–400)
RBC: 5.61 MIL/uL (ref 4.22–5.81)
RDW: 13.2 % (ref 11.5–15.5)
WBC: 6.6 10*3/uL (ref 4.0–10.5)
nRBC: 0 % (ref 0.0–0.2)

## 2023-02-10 LAB — URINALYSIS, ROUTINE W REFLEX MICROSCOPIC
Bacteria, UA: NONE SEEN
Bilirubin Urine: NEGATIVE
Glucose, UA: >=500 mg/dL — AB
Hgb urine dipstick: NEGATIVE
Ketones, ur: NEGATIVE mg/dL
Leukocytes,Ua: NEGATIVE
Nitrite: NEGATIVE
Protein, ur: NEGATIVE mg/dL
Specific Gravity, Urine: 1.02 (ref 1.005–1.030)
pH: 7 (ref 5.0–8.0)

## 2023-02-10 LAB — COMPREHENSIVE METABOLIC PANEL
ALT: 21 U/L (ref 0–44)
AST: 21 U/L (ref 15–41)
Albumin: 3.6 g/dL (ref 3.5–5.0)
Alkaline Phosphatase: 108 U/L (ref 38–126)
Anion gap: 9 (ref 5–15)
BUN: 28 mg/dL — ABNORMAL HIGH (ref 8–23)
CO2: 25 mmol/L (ref 22–32)
Calcium: 9.3 mg/dL (ref 8.9–10.3)
Chloride: 99 mmol/L (ref 98–111)
Creatinine, Ser: 1.44 mg/dL — ABNORMAL HIGH (ref 0.61–1.24)
GFR, Estimated: 51 mL/min — ABNORMAL LOW (ref >60)
Glucose, Bld: 394 mg/dL — ABNORMAL HIGH (ref 70–99)
Potassium: 3.9 mmol/L (ref 3.5–5.1)
Sodium: 133 mmol/L — ABNORMAL LOW (ref 135–145)
Total Bilirubin: 0.4 mg/dL (ref 0.3–1.2)
Total Protein: 6.7 g/dL (ref 6.5–8.1)

## 2023-02-10 LAB — CBG MONITORING, ED: Glucose-Capillary: 338 mg/dL — ABNORMAL HIGH (ref 70–99)

## 2023-02-10 MED ORDER — SODIUM CHLORIDE 0.9 % IV SOLN: INTRAVENOUS | Status: DC

## 2023-02-10 MED ORDER — IOHEXOL 350 MG/ML SOLN
75.0000 mL | Freq: Once | INTRAVENOUS | Status: AC | PRN
  Administered 2023-02-10: 75 mL via INTRAVENOUS

## 2023-02-10 MED ORDER — SODIUM CHLORIDE 0.9 % IV BOLUS
1000.0000 mL | Freq: Once | INTRAVENOUS | Status: AC
  Administered 2023-02-10: 1000 mL via INTRAVENOUS

## 2023-02-10 NOTE — ED Provider Notes (Signed)
[facility] Provider Note   CSN: [id number] Arrival date & time: 02/10/23  4782     History  Chief Complaint  Patient presents with   Loss of Consciousness    Barry [name] is a 72 y.o. male.  Pt is a 72 yo male with pmhx significant for htn, dm, iliac artery aneurysm, dissection of superior mesenteric artery, CAD s/p CABG and stents, CHF, carotid stenosis, CVA, CKD, and depression.  Pt's family heard a noise in the bathroom this am and found pt on the ground in the bathroom.  According to epic notes, PT has been seeing patient and a rollator walker request was just made to his pcp.  He has not picked it up yet. Pt does not remember what happened.  He denies any new pain.  Family assists with translation as our interpreter line is not working and pt speaks [place].  Pt said he's tired, but denies any new pain.  He has abd and back pain.       Home Medications Prior to Admission medications   Medication Sig Start Date End Date Taking? Authorizing Provider  acetaminophen (TYLENOL) 325 MG tablet Take 2 tablets (650 mg total) by mouth every 6 (six) hours as needed for mild pain, fever or headache. 09/28/21   Gold, Glenice Laine, PA-C  aspirin 81 MG EC tablet Take 1 tablet (81 mg total) by mouth daily. 01/10/22   Alver Sorrow, NP  blood glucose meter kit and supplies Dispense based on patient and insurance preference. Use up to four times daily as directed. (FOR ICD-10 E10.9, E11.9). 05/10/19   Copland, Gwenlyn Found, MD  dapagliflozin propanediol (FARXIGA) 5 MG TABS tablet Take 1 tablet (5 mg total) by mouth daily before breakfast. 08/01/22   Copland, Gwenlyn Found, MD  gabapentin (NEURONTIN) 300 MG capsule Take 300 mg by mouth as needed (pain).    [provider]  glucose blood (ONETOUCH ULTRA) test strip USE  STRIP TO CHECK GLUCOSE TWICE DAILY 05/13/22   Copland, Gwenlyn Found, MD  Insulin Pen Needle (TECHLITE PEN NEEDLES) 32G X 8 MM MISC Use daily to  administer insulin 05/13/22   Copland, Gwenlyn Found, MD  isosorbide mononitrate (IMDUR) 30 MG 24 hr tablet Take 1 tablet (30 mg total) by mouth daily. You can take an additional half tablet as needed for chest pain 10/07/22   Gaston Islam., NP  Lancets [facility] DELICA PLUS [place]) MISC Check blood sugars twice daily 07/20/22   Copland, Gwenlyn Found, MD  LANTUS 100 UNIT/ML injection INJECT 22 TO 27 UNITS SUBCUTANEOUSLY ONCE DAILY 02/03/23   Copland, Gwenlyn Found, MD  lisinopril (ZESTRIL) 20 MG tablet Take 20 mg by mouth daily. Patient taking 1/2 tablet daily 03/14/22   [provider]  metoprolol tartrate (LOPRESSOR) 25 MG tablet Take 1 tablet (25 mg total) by mouth 2 (two) times daily. 10/12/21   Alver Sorrow, NP  morphine (MSIR) 15 MG tablet Take 0.5 tablets (7.5 mg total) by mouth every 4 (four) hours as needed for severe pain. 09/30/22   Melene Plan, DO  nitroGLYCERIN (NITROSTAT) 0.4 MG SL tablet Place 1 tablet (0.4 mg total) under the tongue every 5 (five) minutes as needed for chest pain. 07/28/22   Copland, Gwenlyn Found, MD  ofloxacin (FLOXIN OTIC) 0.3 % OTIC solution Place 10 drops into the right ear daily. Use for 7- 10 days as needed 07/28/22   Copland, Gwenlyn Found, MD  ondansetron (ZOFRAN-ODT) 4  MG disintegrating tablet 4mg  ODT q4 hours prn nausea/vomit 09/30/22   Melene Plan, DO  oxyCODONE-acetaminophen (PERCOCET) 5-325 MG tablet Take 1 tablet by mouth every 4 (four) hours as needed for severe pain. 11/21/22   Rene Paci, MD  rosuvastatin (CRESTOR) 40 MG tablet Take 1 tablet by mouth once daily 01/23/23   Tonny Bollman, MD      Allergies    Metformin and related    Review of Systems   Review of Systems  Gastrointestinal:  Positive for abdominal pain.  Musculoskeletal:  Positive for back pain.  Neurological:  Positive for weakness.  All other systems reviewed and are negative.   Physical Exam Updated Vital Signs BP (!) 171/85 (BP Location: Right Arm)   Pulse (!) 57    Temp 97.6 F (36.4 C) (Oral)   Resp 16   Ht 5\' 5"  (1.651 m)   Wt 74.8 kg   SpO2 97%   BMI 27.46 kg/m  Physical Exam Vitals and nursing note reviewed.  Constitutional:      Appearance: Normal appearance.  HENT:     Head: Normocephalic and atraumatic.     Right Ear: External ear normal.     Left Ear: External ear normal.     Nose: Nose normal.     Mouth/Throat:     Mouth: Mucous membranes are moist.     Pharynx: Oropharynx is clear.  Eyes:     Extraocular Movements: Extraocular movements intact.     Conjunctiva/sclera: Conjunctivae normal.     Pupils: Pupils are equal, round, and reactive to light.  Neck:     Comments: In c-collar Cardiovascular:     Rate and Rhythm: Normal rate and regular rhythm.     Pulses: Normal pulses.     Heart sounds: Normal heart sounds.  Pulmonary:     Effort: Pulmonary effort is normal.     Breath sounds: Normal breath sounds.  Abdominal:     General: Abdomen is flat. Bowel sounds are normal.     Palpations: Abdomen is soft.  Musculoskeletal:        General: Normal range of motion.  Skin:    General: Skin is warm.     Capillary Refill: Capillary refill takes less than 2 seconds.  Neurological:     General: No focal deficit present.     Mental Status: He is alert and oriented to person, place, and time.  Psychiatric:        Mood and Affect: Mood normal.        Behavior: Behavior normal.     ED Results / Procedures / Treatments   Labs (all labs ordered are listed, but only abnormal results are displayed) Labs Reviewed  CBC WITH DIFFERENTIAL/PLATELET - Abnormal; Notable for the following components:      Result Value   Platelets 132 (*)    All other components within normal limits  COMPREHENSIVE METABOLIC PANEL - Abnormal; Notable for the following components:   Sodium 133 (*)    Glucose, Bld 394 (*)    BUN 28 (*)    Creatinine, Ser 1.44 (*)    GFR, Estimated 51 (*)    All other components within normal limits  URINALYSIS,  ROUTINE W REFLEX MICROSCOPIC - Abnormal; Notable for the following components:   Color, Urine STRAW (*)    Glucose, UA >=500 (*)    All other components within normal limits  CBG MONITORING, ED - Abnormal; Notable for the following components:   Glucose-Capillary 338 (*)  All other components within normal limits  TROPONIN I (HIGH SENSITIVITY) - Abnormal; Notable for the following components:   Troponin I (High Sensitivity) 69 (*)    All other components within normal limits  TROPONIN I (HIGH SENSITIVITY) - Abnormal; Notable for the following components:   Troponin I (High Sensitivity) 65 (*)    All other components within normal limits    EKG EKG Interpretation  Date/Time:  Friday Feb 10 2023 07:36:46 EDT Ventricular Rate:  56 PR Interval:  190 QRS Duration: 121 QT Interval:  490 QTC Calculation: 473 R Axis:   75 Text Interpretation: Sinus rhythm Nonspecific intraventricular conduction delay Anteroseptal infarct, age indeterminate No significant change since last tracing Confirmed by Jacalyn Lefevre [phone]) on 02/10/2023 8:11:25 AM  Radiology CT L-SPINE NO CHARGE  Result Date: 02/10/2023 CLINICAL DATA:  72 year old male status post fall. EXAM: CT LUMBAR SPINE WITH CONTRAST TECHNIQUE: Technique: Multiplanar CT images of the lumbar spine were reconstructed from contemporary CT of the Abdomen and Pelvis. RADIATION DOSE REDUCTION: This exam was performed according to the departmental dose-optimization program which includes automated exposure control, adjustment of the mA and/or kV according to patient size and/or use of iterative reconstruction technique. CONTRAST:  No additional COMPARISON:  CT Chest, Abdomen, and Pelvis today reported separately. CTA abdomen and pelvis 09/30/2022. FINDINGS: Segmentation: Transitional anatomy, sacralized L5 level. Vestigial L5-S1 disc space. Correlation with radiographs is recommended prior to any operative intervention. Alignment: Stable straightening  of lumbar lordosis. No significant scoliosis or spondylolisthesis. Vertebrae: No acute osseous abnormality identified. Visible sacrum and SI joints appear intact. Paraspinal and other soft tissues: Abdomen and pelvis are detailed separately. Lumbar paraspinal soft tissues remain within normal limits. Disc levels: Widespread lumbar disc bulging and endplate spurring superimposed on congenital spinal canal narrowing due to short pedicle distance. No change compared to January. IMPRESSION: 1. No acute traumatic injury identified in the Lumbar Spine. 2. Transitional anatomy with fully sacralized L5 level. Correlation with radiographs is recommended prior to any operative intervention. 3. Chronic lumbar disc bulging and endplate spurring superimposed on congenital spinal canal narrowing. Electronically Signed   By: Odessa Fleming M.D.   On: 02/10/2023 10:25   CT CHEST ABDOMEN PELVIS W CONTRAST  Result Date: 02/10/2023 CLINICAL DATA:  72 year old male status post fall. EXAM: CT CHEST, ABDOMEN, AND PELVIS WITH CONTRAST TECHNIQUE: Multidetector CT imaging of the chest, abdomen and pelvis was performed following the standard protocol during bolus administration of intravenous contrast. RADIATION DOSE REDUCTION: This exam was performed according to the departmental dose-optimization program which includes automated exposure control, adjustment of the mA and/or kV according to patient size and/or use of iterative reconstruction technique. CONTRAST:  75mL OMNIPAQUE IOHEXOL 350 MG/ML SOLN COMPARISON:  Lumbar spine CT today reported separately. CTA chest, Abdomen and Pelvis 09/30/2022. FINDINGS: CT CHEST FINDINGS Cardiovascular: Bulky thoracic aortic atherosclerosis, status post CABG. Stable thoracic aorta since January. No periaortic hematoma. Stable cardiomegaly. No pericardial effusion. Other central mediastinal vascular structures appear intact. Mediastinum/Nodes: No mediastinal hematoma, mass, lymphadenopathy. Lungs/Pleura:  Mildly lower lung volumes compared to January. Atelectatic changes to the major airways. Nonspecific polypoid lesion of the anterior larynx at the true cord level on series 5, image 1. This was not included on the cervical spine images today. But also evidence of trace retained secretions along the anterior wall of the trachea at the thoracic inlet series 5, image 26. No pneumothorax, pleural effusion, pulmonary contusion. Symmetric appearing pulmonary atelectasis. Musculoskeletal: Chronic sternotomy. Thoracic osseous structures appears stable since January.  No acute osseous abnormality identified. No superficial soft tissue injury identified. CT ABDOMEN PELVIS FINDINGS Hepatobiliary: Chronic cholelithiasis. Early contrast timing initially. No liver injury or perihepatic fluid identified. Pancreas: Negative. Spleen: Stable, no splenic injury or perisplenic fluid identified. Adrenals/Urinary Tract: Adrenal glands, left kidney and ureter remain normal. Dilated urinary bladder today, volume at least 550 mL. On the delayed phase images there is fairly symmetric appearing contrast excretion from both kidneys. But asymmetric right hydronephrosis and hydroureter persist, not significantly changed since January. After the right ureter crosses the iliac vessels it is decompressed to the UVJ, similar to the prior exam. Stomach/Bowel: Redundant sigmoid colon. Similar retained stool throughout the large bowel since January. Stable, negative appendix. No dilated small bowel. Unremarkable stomach and duodenum. No free air or free fluid. Vascular/Lymphatic: Extensive Calcified aortic atherosclerosis. Negative for abdominal aortic aneurysm or dissection. Chronic SMA dissection or pseudoaneurysm on series 1, image 67 is unchanged from January. SMA and its major branches remain patent. Chronic short segment occlusion of the left internal iliac artery is stable on series 1, image 91. Extensive iliac artery atherosclerosis. Proximal  femoral arteries remain patent. No acute arterial injury identified. Early portal venous phase timing initially. On the delayed images the visible portal venous system appears to be patent. No lymphadenopathy. Reproductive: Stable, negative. Other: No pelvis free fluid. Musculoskeletal: Lumbar spine is detailed separately today. No acute osseous abnormality identified. Small ventral abdominal wall probable injection sites appears stable on series 1, image 91. No acute superficial soft tissue injury identified. IMPRESSION: 1. No acute traumatic injury identified in the chest, abdomen, or pelvis. Lumbar spine CT is reported separately. 2. Nonspecific polypoid lesion of the anterior larynx, partially visible. This might be retained secretions (similar to seen in the anterior subglottic trachea), but cannot exclude laryngeal polyp or mass. Follow-up neck CT with IV contrast versus follow-up with ENT for direct visualization. 3. Unresolved right hydronephrosis and hydroureter is since January, possibly due to ureteral stricture at the pelvic inlet. However, today there is superimposed urinary bladder distension (550 mL), query urinary retention. 4. Advanced Aortic Atherosclerosis (ICD10-I70.0) is stable from January CTA. 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 5. Chronic cholelithiasis. Electronically Signed   By: Odessa Fleming M.D.   On: 02/10/2023 10:21   CT CERVICAL SPINE WO CONTRAST  Result Date: 02/10/2023 CLINICAL DATA:  72 year old male status post fall. EXAM: CT CERVICAL SPINE WITHOUT CONTRAST TECHNIQUE: Multidetector CT imaging of the cervical spine was performed without intravenous contrast. Multiplanar CT image reconstructions were also generated. RADIATION DOSE REDUCTION: This exam was performed according to the departmental dose-optimization program which includes automated exposure control, adjustment of the  mA and/or kV according to patient size and/or use of iterative reconstruction technique. COMPARISON:  Cervical spine radiographs 04/25/2022. FINDINGS: Alignment: Stable. Chronic straightening of cervical lordosis. Cervicothoracic junction alignment is within normal limits. Bilateral posterior element alignment is within normal limits. Skull base and vertebrae: Bone mineralization is within normal limits for age. Visualized skull base is intact. No atlanto-occipital dissociation. C1 and C2 appear intact and aligned. No acute osseous abnormality identified. Soft tissues and spinal canal: No prevertebral fluid or swelling. No visible canal hematoma. Calcified carotid bifurcation atherosclerosis, otherwise negative visible noncontrast neck soft tissues. Disc levels: Widespread cervical disc bulging. Mild endplate spurring. Up to mild associated degenerative cervical spinal stenosis. Upper chest: Chest CT today reported separately. IMPRESSION: 1. No acute traumatic injury identified  in the cervical spine. 2. Cervical spine degeneration with probable mild degenerative spinal stenosis. Electronically Signed   By: Odessa Fleming M.D.   On: 02/10/2023 10:09   CT HEAD WO CONTRAST  Result Date: 02/10/2023 CLINICAL DATA:  72 year old male status post fall. EXAM: CT HEAD WITHOUT CONTRAST TECHNIQUE: Contiguous axial images were obtained from the base of the skull through the vertex without intravenous contrast. RADIATION DOSE REDUCTION: This exam was performed according to the departmental dose-optimization program which includes automated exposure control, adjustment of the mA and/or kV according to patient size and/or use of iterative reconstruction technique. COMPARISON:  Brain MRI and Head CT  12/19/2019. FINDINGS: Brain: Cerebral volume remains normal for age. No midline shift, ventriculomegaly, mass effect, evidence of mass lesion, intracranial hemorrhage or evidence of cortically based acute infarction. Subtle chronic right  caudate lacunar infarct is stable. Stable gray-white matter differentiation throughout the brain. Mild asymmetric left basal ganglia vascular calcification. Vascular: No suspicious intracranial vascular hyperdensity. Skull: No acute fracture identified. Sinuses/Orbits: Partial opacification of the right tympanic cavity and mastoids is new, but there is underlying chronic right mastoid sclerosis. Other Visualized paranasal sinuses and mastoids are stable and well aerated. Other: Visualized orbits and scalp soft tissues are within normal limits. IMPRESSION: 1. No acute intracranial abnormality or acute traumatic injury identified. 2. Stable mild for age chronic small vessel disease. 3. New partial opacification of the right tympanic cavity and mastoids, but probably inflammatory. Electronically Signed   By: Odessa Fleming M.D.   On: 02/10/2023 10:06   DG Pelvis Portable  Result Date: 02/10/2023 CLINICAL DATA:  fall EXAM: PORTABLE PELVIS 1-2 VIEWS COMPARISON:  CT 09/30/2022, radiograph 12/05/2022. FINDINGS: There is no evidence of acute fracture. Alignment is normal. Minimal degenerative changes of the hips. IMPRESSION: No evidence of acute fracture on single frontal view of the pelvis. Note that if the patient is unable to bear weight and clinical suspicion for non-displaced hip fracture is significant, CT or MRI would be more sensitive. Electronically Signed   By: Caprice Renshaw M.D.   On: 02/10/2023 08:51   DG Chest Port 1 View  Result Date: 02/10/2023 CLINICAL DATA:  Fall EXAM: PORTABLE CHEST 1 VIEW COMPARISON:  CXR 12/05/22 FINDINGS: No pleural effusion. No pneumothorax. Status post median sternotomy and CABG. Unchanged cardiac and mediastinal contours. No focal airspace opacity. No radiographically apparent displaced rib fractures. Visualized upper abdomen is unremarkable. IMPRESSION: No focal airspace opacity. Electronically Signed   By: Lorenza Cambridge M.D.   On: 02/10/2023 08:35    Procedures Procedures     Medications Ordered in ED Medications  sodium chloride 0.9 % bolus 1,000 mL (0 mLs Intravenous Stopped 02/10/23 0905)    And  0.9 %  sodium chloride infusion ( Intravenous New Bag/Given 02/10/23 0903)  iohexol (OMNIPAQUE) 350 MG/ML injection 75 mL (75 mLs Intravenous Contrast Given 02/10/23 1610)    ED Course/ Medical Decision Making/ A&P                             Medical Decision Making Amount and/or Complexity of Data Reviewed Labs: ordered. Radiology: ordered.  Risk Prescription drug management.   This patient presents to the ED for concern of fall, this involves an extensive number of treatment options, and is a complaint that carries with it a high risk of complications and morbidity.  The differential diagnosis includes fall, syncope, fx, infection, mi   Co morbidities that complicate the patient  evaluation  htn, dm, iliac artery aneurysm, dissection of superior mesenteric artery, CAD s/p CABG and stents, CHF, carotid stenosis, CVA, CKD, and depression   Additional history obtained:  Additional history obtained from epic chart review External records from outside source obtained and reviewed including EMS report/family   Lab Tests:  I Ordered, and personally interpreted labs.  The pertinent results include:  cbc nl, cmp nl other than bs elevated at 394 and bun 28/cr 1.44 (chronic); trop elevated at 69, but is stable with a repeat trop 65; ua nl other than some glucose   Imaging Studies ordered:  I ordered imaging studies including cxr, pelvis, ct head/neck, chest/abd/pelvis, CT lumbar I independently visualized and interpreted imaging which showed  CXR: No focal airspace opacity.  Pelvis: No evidence of acute fracture on single frontal view of the pelvis.  Note that if the patient is unable to bear weight and clinical  suspicion for non-displaced hip fracture is significant, CT or MRI  would be more sensitive.  CT head:  No acute intracranial abnormality  or acute traumatic injury  identified.  2. Stable mild for age chronic small vessel disease.  3. New partial opacification of the right tympanic cavity and  mastoids, but probably inflammatory.  CT c-spine: No acute traumatic injury identified in the cervical spine.  2. Cervical spine degeneration with probable mild degenerative  spinal stenosis.  CT chest/abd/pelvis:  No acute traumatic injury identified in the chest, abdomen, or  pelvis. Lumbar spine CT is reported separately.    2. Nonspecific polypoid lesion of the anterior larynx, partially  visible. This might be retained secretions (similar to seen in the  anterior subglottic trachea), but cannot exclude laryngeal polyp or  mass. Follow-up neck CT with IV contrast versus follow-up with ENT  for direct visualization.  3. Unresolved right hydronephrosis and hydroureter is since January,  possibly due to ureteral stricture at the pelvic inlet. However,  today there is superimposed urinary bladder distension (550 mL),  query urinary retention.  4. Advanced Aortic Atherosclerosis (ICD10-I70.0) is stable from  January CTA. Stable chronic short segment occlusion of the left  internal iliac artery and chronic SMA short segment dissection or  pseudoaneurysm with web.  5. Chronic cholelithiasis.  CT lumbar:  No acute traumatic injury identified in the Lumbar Spine.  2. Transitional anatomy with fully sacralized L5 level. Correlation  with radiographs is recommended prior to any operative intervention.  3. Chronic lumbar disc bulging and endplate spurring superimposed on  congenital spinal canal narrowing.   I agree with the radiologist interpretation   Cardiac Monitoring:  The patient was maintained on a cardiac monitor.  I personally viewed and interpreted the cardiac monitored which showed an underlying rhythm of: nsr   Medicines ordered and prescription drug management:  I ordered medication including ivfs  for sx   Reevaluation of the patient after these medicines showed that the patient improved I have reviewed the patients home medicines and have made adjustments as needed   Test Considered:  ct   Critical Interventions:  ct   Problem List / ED Course:  Fall:  no injury.  He did not want anything for pain while here.  Pt is able to walk with a walker without any problems.  Family picked up walker while he was here, [place] he will have it for home. Hyperglycemia:  pt is not in dka.  He has not taken his meds this am.  He is instructed to eat a diabetic  diet and take his bs meds when he gets home. Hydronephrosis:  son said he was seen at [facility] urology last week.  Hydronephrosis is not new.  Pt is to follow up with urology. Laryngeal polyp:  pt told to f/u with ENT.   Reevaluation:  After the interventions noted above, I reevaluated the patient and found that they have :improved   Social Determinants of Health:  Lives at home.  Speaks Khmer.   Dispostion:  After consideration of the diagnostic results and the patients response to treatment, I feel that the patent would benefit from discharge with outpatient f/u.          Final Clinical Impression(s) / ED Diagnoses Final diagnoses:  Laryngeal polyp  Hydronephrosis of right kidney  Hyperglycemia due to diabetes mellitus (HCC)  Fall, initial encounter    Rx / DC Orders ED Discharge Orders     None         Jacalyn Lefevre, MD 02/10/23 1254

## 2023-02-10 NOTE — ED Notes (Signed)
Pt reports some dizziness when standing, pt able to complete orthostatic vital signs.

## 2023-02-10 NOTE — ED Triage Notes (Signed)
Pt to er room number 22 via ems per ems family heard a thud in the bathroom and found pt down in the bathroom, computer interpreter not working, family at bedside helping to translate for pt, pt speaks [place].  Pt stats that he is tired.  Family states that pt doesn't remember what happened to him.  Pt moving all extremities.  Pt oriented times three, pt knows that he is in the hospital, it is Friday and oriented to person.  Pt c/o back pain and abd pain.

## 2023-02-10 NOTE — ED Notes (Signed)
Pt in bed, son at bedside helping to translate for pt, verbalized understanding d/c and follow up about polyp and kidney, pt from dpt with son via walker.

## 2023-02-10 NOTE — ED Notes (Signed)
Pt does not have a  maker, md notified

## 2023-02-10 NOTE — ED Notes (Signed)
Pt in bed, son at bedside, pt ambulatory with walker, pt denies dizziness, pt ambulated well.

## 2023-02-10 NOTE — Discharge Instructions (Addendum)
Use your walker to walk

## 2023-02-10 NOTE — ED Notes (Signed)
Pt back from ct, son at bedside, per son pt reports 7/10 back pain and doesn't want anything for pain at this time, resps even and unlabored.

## 2023-02-10 NOTE — ED Notes (Signed)
Pt to ct 

## 2023-02-17 ENCOUNTER — Telehealth: Payer: Self-pay

## 2023-02-17 NOTE — Telephone Encounter (Signed)
Transition Care Management Follow-up Telephone Call Date of discharge and from where: 02/10/2023 [facility] How have you been since you were released from the hospital? Patient is feeling much better. Spoke with patients' son Pros Any questions or concerns? No  Items Reviewed: Did the pt receive and understand the discharge instructions provided? Yes  Medications obtained and verified? Yes  Other? No  Any new allergies since your discharge? No  Dietary orders reviewed? Yes Do you have support at home? Yes   Follow up appointments reviewed:  PCP Hospital f/u appt confirmed?  Patient is feeling better but will make appointment if needed  Scheduled to see  on @ . Specialist Hospital f/u appt confirmed? No  Scheduled to see  on  @ . Are transportation arrangements needed? No  If their condition worsens, is the pt aware to call PCP or go to the Emergency Dept.? Yes Was the patient provided with contact information for the PCP's office or ED? Yes Was to pt encouraged to call back with questions or concerns? Yes  [facility]  [facility] Population Health Community Resource Care Guide   [email or web address]  ?? [phone]   Website: [email or web address]  [email or web address]

## 2023-03-13 DIAGNOSIS — H40033 Anatomical narrow angle, bilateral: Secondary | ICD-10-CM | POA: Diagnosis not present

## 2023-03-13 DIAGNOSIS — H2513 Age-related nuclear cataract, bilateral: Secondary | ICD-10-CM | POA: Diagnosis not present

## 2023-03-15 ENCOUNTER — Other Ambulatory Visit: Payer: Self-pay | Admitting: Urology

## 2023-03-15 DIAGNOSIS — N130 Hydronephrosis with ureteropelvic junction obstruction: Secondary | ICD-10-CM | POA: Diagnosis not present

## 2023-03-15 DIAGNOSIS — R109 Unspecified abdominal pain: Secondary | ICD-10-CM | POA: Diagnosis not present

## 2023-03-15 DIAGNOSIS — N281 Cyst of kidney, acquired: Secondary | ICD-10-CM | POA: Diagnosis not present

## 2023-03-15 DIAGNOSIS — K8020 Calculus of gallbladder without cholecystitis without obstruction: Secondary | ICD-10-CM | POA: Diagnosis not present

## 2023-03-19 NOTE — Progress Notes (Signed)
COVID Vaccine received:  []  No [x]  Yes Date of any COVID positive Test in last 90 days:  PCP - Abbe Amsterdam, MD Cardiologist - Tonny Bollman, MD  Chest x-ray - 02-10-23  1v  Epic EKG -  02-13-2023  Stress Test - 06-03-2022  Epic ECHO -  Cardiac Cath - numerous - last 07-01-2022  RHC by Dr. Excell Seltzer CABG in 09-21-2021 by Dr. Irving Copas  PCR screen: []  Ordered & Completed           []   No Order but Needs PROFEND           [x]   N/A for this surgery  Surgery Plan:  [x]  Ambulatory                            []  Outpatient in bed                            []  Admit  Anesthesia:    [x]  General  []  Spinal                           []   Choice []   MAC  Bowel Prep - [x]  No  []   Yes ______  Pacemaker / ICD device [x]  No []  Yes   Spinal Cord Stimulator:[x]  No []  Yes       History of Sleep Apnea? [x]  No []  Yes   CPAP used?- [x]  No []  Yes    Does the patient monitor blood sugar?          []  No []  Yes  []  N/A  Patient has: []  NO Hx DM   []  Pre-DM                 []  DM1  [x]   DM2 Does patient have a [employer] or Dexacom? []  No []  Yes   Fasting Blood Sugar Ranges-  Checks Blood Sugar _____ times a day  GLP1 agonist / usual dose -  GLP1 instructions:  SGLT-2 inhibitors / usual dose -  Farxiga   hold x 72 hours SGLT-2 instructions:   Other Diabetic medications/ instructions:   Blood Thinner / Instructions: none Aspirin Instructions:  none  ERAS Protocol Ordered: [x]  No  []  Yes Patient is to be NPO after:  midnight prior  Comments:   Activity level: Patient is able / unable to climb a flight of stairs without difficulty; []  No CP  []  No SOB, but would have ___   Patient can / can not perform ADLs without assistance.   Anesthesia review: Hx NSTEMI- CABG x 4, HFmrRF, Hx CVA, uncontrolled DM2, HTN, CKD3  Patient denies shortness of breath, fever, cough and chest pain at PAT appointment.  Patient verbalized understanding and agreement to the Pre-Surgical Instructions that were  given to them at this PAT appointment. Patient was also educated of the need to review these PAT instructions again prior to his surgery.I reviewed the appropriate phone numbers to call if they have any and questions or concerns.

## 2023-03-19 NOTE — Patient Instructions (Signed)
SURGICAL WAITING ROOM VISITATION Patients having surgery or a procedure may have no more than 2 support people in the waiting area - these visitors may rotate in the visitor waiting room.   Due to an increase in RSV and influenza rates and associated hospitalizations, children ages 80 and under may not visit patients in [facility] hospitals. If the patient needs to stay at the hospital during part of their recovery, the visitor guidelines for inpatient rooms apply.  PRE-OP VISITATION  Pre-op nurse will coordinate an appropriate time for 1 support person to accompany the patient in pre-op.  This support person may not rotate.  This visitor will be contacted when the time is appropriate for the visitor to come back in the pre-op area.  Please refer to the [facility] website for the visitor guidelines for Inpatients (after your surgery is over and you are in a regular room).  You are not required to quarantine at this time prior to your surgery. However, you must do this: Hand Hygiene often Do NOT share personal items Notify your provider if you are in close contact with someone who has COVID or you develop fever 100.4 or greater, new onset of sneezing, cough, sore throat, shortness of breath or body aches.  If you test positive for Covid or have been in contact with anyone that has tested positive in the last 10 days please notify you surgeon.    Your procedure is scheduled on:  Tuesday  March 28, 2023  Report to [facility] Main Entrance: [facility]sen entrance where the [employer] is available.   Report to admitting at: 10:15  AM  +++++Call this number if you have any questions or problems the morning of surgery [phone]  DO NOT EAT OR DRINK ANYTHING AFTER MIDNIGHT THE NIGHT PRIOR TO YOUR SURGERY / PROCEDURE.   FOLLOW BOWEL PREP AND ANY ADDITIONAL PRE OP INSTRUCTIONS YOU RECEIVED FROM YOUR SURGEON'S OFFICE!!!   Oral Hygiene is also important to reduce your risk of infection.         Remember - BRUSH YOUR TEETH THE MORNING OF SURGERY WITH YOUR REGULAR TOOTHPASTE  Do NOT smoke after Midnight the night before surgery.     Diabetic Medication:  Insulin Lantus-  Day before surgery:  Take 50% of your usual dose or (14 units)    Day of your surgery:  Take 50% of your usual dose or 14 units  Take these Medications the morning of your surgery: Metoprolol,   Gabapentin if needed.                    You may not have any metal on your body including  jewelry, and body piercing  Do not wear lotions, powders, cologne, or deodorant  Men may shave face and neck.  Contacts, Hearing Aids, dentures or bridgework may not be worn into surgery. DENTURES WILL BE REMOVED PRIOR TO SURGERY PLEASE DO NOT APPLY "Poly grip" OR ADHESIVES!!!  Patients discharged on the day of surgery will not be allowed to drive home.  Someone NEEDS to stay with you for the first 24 hours after anesthesia.  Do not bring your home medications to the hospital. The Pharmacy will dispense medications listed on your medication list to you during your admission in the Hospital.   Please read over the following fact sheets you were given: IF YOU HAVE QUESTIONS ABOUT YOUR PRE-OP INSTRUCTIONS, PLEASE CALL [phone].    - Preparing for Surgery Before surgery, you  can play an important role.  Because skin is not sterile, your skin needs to be as free of germs as possible.  You can reduce the number of germs on your skin by washing with CHG (chlorahexidine gluconate) soap before surgery.  CHG is an antiseptic cleaner which kills germs and bonds with the skin to continue killing germs even after washing. Please DO NOT use if you have an allergy to CHG or antibacterial soaps.  If your skin becomes reddened/irritated stop using the CHG and inform your nurse when you arrive at Short Stay. Do not shave (including legs and underarms) for at least 48 hours prior to the first CHG shower.  You may shave your  face/neck.  Please follow these instructions carefully:  1.  Shower with CHG Soap the night before surgery and the  morning of surgery.  2.  If you choose to wash your hair, wash your hair first as usual with your normal  shampoo.  3.  After you shampoo, rinse your hair and body thoroughly to remove the shampoo.                             4.  Use CHG as you would any other liquid soap.  You can apply chg directly to the skin and wash.  Gently with a scrungie or clean washcloth.  5.  Apply the CHG Soap to your body ONLY FROM THE NECK DOWN.   Do not use on face/ open                           Wound or open sores. Avoid contact with eyes, ears mouth and genitals (private parts).                       Wash face,  Genitals (private parts) with your normal soap.             6.  Wash thoroughly, paying special attention to the area where your  surgery  will be performed.  7.  Thoroughly rinse your body with warm water from the neck down.  8.  DO NOT shower/wash with your normal soap after using and rinsing off the CHG Soap.            9.  Pat yourself dry with a clean towel.            10.  Wear clean pajamas.            11.  Place clean sheets on your bed the night of your first shower and do not  sleep with pets.  ON THE DAY OF SURGERY : Do not apply any lotions/deodorants the morning of surgery.  Please wear clean clothes to the hospital/surgery center.    FAILURE TO FOLLOW THESE INSTRUCTIONS MAY RESULT IN THE CANCELLATION OF YOUR SURGERY  PATIENT SIGNATURE_________________________________  NURSE SIGNATURE__________________________________  ________________________________________________________________________

## 2023-03-20 ENCOUNTER — Telehealth: Payer: Self-pay | Admitting: *Deleted

## 2023-03-20 NOTE — Telephone Encounter (Signed)
   Name: Barry [name]  DOB: August 20, 1951  MRN: [id number]  Primary Cardiologist: Tonny Bollman, MD   Preoperative team, please contact this patient and set up a phone call appointment for further preoperative risk assessment. Please obtain consent and complete medication review. Thank you for your help.  I confirm that guidance regarding antiplatelet and oral anticoagulation therapy has been completed and, if necessary, noted below.  If necessary his aspirin may be held for 5-7 days prior to his procedure.  Please resume as soon as hemostasis is achieved.   Ronney Asters, NP 03/20/2023, 8:45 AM [facility]

## 2023-03-20 NOTE — Telephone Encounter (Signed)
I s/w the pt son Barry [name] who has helped schedule the pt for tele pre op appt tomorrow add on due to procedure date and med hold.   Med rec and consent are done.     Patient Consent for Virtual Visit        Barry [name] has provided verbal consent on 03/20/2023 for a virtual visit (video or telephone).   CONSENT FOR VIRTUAL VISIT FOR:  Barry [name]  By participating in this virtual visit I agree to the following:  I hereby voluntarily request, consent and authorize [facility] and its employed or contracted physicians, physician assistants, nurse practitioners or other licensed health care professionals (the Practitioner), to provide me with telemedicine health care services (the "Services") as deemed necessary by the treating Practitioner. I acknowledge and consent to receive the Services by the Practitioner via telemedicine. I understand that the telemedicine visit will involve communicating with the Practitioner through live audiovisual communication technology and the disclosure of certain medical information by electronic transmission. I acknowledge that I have been given the opportunity to request an in-person assessment or other available alternative prior to the telemedicine visit and am voluntarily participating in the telemedicine visit.  I understand that I have the right to withhold or withdraw my consent to the use of telemedicine in the course of my care at any time, without affecting my right to future care or treatment, and that the Practitioner or I may terminate the telemedicine visit at any time. I understand that I have the right to inspect all information obtained and/or recorded in the course of the telemedicine visit and may receive copies of available information for a reasonable fee.  I understand that some of the potential risks of receiving the Services via telemedicine include:  Delay or interruption in medical evaluation due to technological equipment failure or  disruption; Information transmitted may not be sufficient (e.g. poor resolution of images) to allow for appropriate medical decision making by the Practitioner; and/or  In rare instances, security protocols could fail, causing a breach of personal health information.  Furthermore, I acknowledge that it is my responsibility to provide information about my medical history, conditions and care that is complete and accurate to the best of my ability. I acknowledge that Practitioner's advice, recommendations, and/or decision may be based on factors not within their control, such as incomplete or inaccurate data provided by me or distortions of diagnostic images or specimens that may result from electronic transmissions. I understand that the practice of medicine is not an exact science and that Practitioner makes no warranties or guarantees regarding treatment outcomes. I acknowledge that a copy of this consent can be made available to me via my patient portal [facility] MyChart), or I can request a printed copy by calling the office of [facility].    I understand that my insurance will be billed for this visit.   I have read or had this consent read to me. I understand the contents of this consent, which adequately explains the benefits and risks of the Services being provided via telemedicine.  I have been provided ample opportunity to ask questions regarding this consent and the Services and have had my questions answered to my satisfaction. I give my informed consent for the services to be provided through the use of telemedicine in my medical care

## 2023-03-20 NOTE — Telephone Encounter (Signed)
I s/w the pt son Momsavath who has helped schedule the pt for tele pre op appt tomorrow add on due to procedure date and med hold.   Med rec and consent are done.

## 2023-03-20 NOTE — Telephone Encounter (Signed)
   Pre-operative Risk Assessment    Patient Name: Barry [name]  DOB: 05-23-1951 MRN: [id number]      Request for Surgical Clearance    Procedure:   Systo;Right retrograde pyelogram,diagnostic ureteroscopy,Laser litho,stent.  Date of Surgery:  Clearance 03/28/23                                 Surgeon:  Dr. Kasandra Knudsen Surgeon's Group or Practice Name:  [facility] Phone number:  [phone] Fax number:  [phone]   Type of Clearance Requested:   - Medical  - Pharmacy:  Hold Aspirin Not Indicated.   Type of Anesthesia:  General    Additional requests/questions:    Signed, Emmit Pomfret   03/20/2023, 8:30 AM

## 2023-03-20 NOTE — Progress Notes (Unsigned)
Virtual Visit via Telephone Note   Because of Barry [name]'s co-morbid illnesses, he is at least at moderate risk for complications without adequate follow up.  This format is felt to be most appropriate for this patient at this time.  The patient did not have access to video technology/had technical difficulties with video requiring transitioning to audio format only (telephone).  All issues noted in this document were discussed and addressed.  No physical exam could be performed with this format.  Please refer to the patient's chart for his consent to telehealth for [facility].  Evaluation Performed:  Preoperative cardiovascular risk assessment _____________   Date:  03/20/2023   Patient ID:  Barry [name], DOB 1951-06-18, MRN [id number] Patient Location:  Home Provider location:   Office  Primary Care Provider:  Pearline Cables, MD Primary Cardiologist:  Tonny Bollman, MD  Chief Complaint / Patient Profile   72 y.o. y/o male with a h/o hypertension, TIA, coronary artery disease, chronic diastolic CHF who is pending cystoscopy right retrograde pyelogram, diagnostic ureteroscopy, laser lithotripsy stenting and presents today for telephonic preoperative cardiovascular risk assessment.  History of Present Illness    Barry [name] is a 72 y.o. male who presents via audio/video conferencing for a telehealth visit today.  Pt was last seen in cardiology clinic on 11/11/2022 by Edd Fabian, NP-C.  At that time Gregery Mcmeekin was doing well .  The patient is now pending procedure as outlined above. Since his last visit, he remains stable from a cardiac standpoint.  Today he denies chest pain, shortness of breath, lower extremity edema, fatigue, palpitations, melena, hematuria, hemoptysis, diaphoresis, weakness, presyncope, syncope, orthopnea, and PND.   Past Medical History    Past Medical History:  Diagnosis Date   Anginal pain (HCC)    CAD (coronary artery disease)    LHC (02/26/14):  >>  PCI:  Xience DES to pRCA // LHC (03/10/14):  >> PCI:  Xience DES to [facility] // Myoview 2/16: low risk // Cath 2016: patent stents; stable dz - med Rx // Myoview 10/22: anterolateral ischemia, inferoapical infarct w peri infarct ischemia; EF 41; High Risk // Cath 12/22: LCx stent occl, severe LAD and Dx dz, diffuse RCA disease >> CABG   Carotid stenosis    a. Carotid US (02/27/14):  Bilateral ICA 1-39%   CKD (chronic kidney disease) stage 3    Depression    Diabetes mellitus    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HCC)    a. Mesenteric Artery Duplex (7/15):  pSMA chronic dissection with aneurysmal dilation of 1.29 cm (VVS);  b.  Chest CTA (02/26/14):  IMPRESSION:  1. No aortic  dissection or other acute abnormality.  2. 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  3. Stable 17 mm ectasia of the left common iliac artery.  4. Atherosclerosis, including aortoiliac and coronary artery disease.    HFmrEF (heart failure with mildly reduced EF) 09/2015   Echo 6/15: EF 50-55 // Echo 1/17: mod LVH, EF 55%, inf-lat HK, Gr 1 DD, mild LAE // Echocardiogram 10/22: EF 40-45, Gr 1 DD, mildly reduced RVSF, RVSP 26.6, trivial MR, trivial AI, post and ant-lat HK   Hyperlipidemia    Hypertension    Iliac artery aneurysm, left (HCC)    Myocardial infarction (HCC)    PVC's (premature ventricular contractions) 07/21/2021   Monitor 10/22:  NSR avg HR 71; no AF/Flutter; no high grade HB or pathologic pauses PVC burden 20%, rare supraventricular beats w/o sustained arrhythmia  Stroke [facility]) 09/26/2009   Past Surgical History:  Procedure Laterality Date   CARDIAC CATHETERIZATION     CARDIAC CATHETERIZATION N/A 06/25/2015   Procedure: Left Heart Cath and Coronary Angiography;  Surgeon: Kathleene Hazel, MD;  Location: [facility] INVASIVE CV LAB;  Service: Cardiovascular;  Laterality: N/A;   COLONOSCOPY WITH PROPOFOL N/A 07/23/2015   Procedure: COLONOSCOPY WITH PROPOFOL;  Surgeon: Rachael Fee, MD;  Location: [facility] ENDOSCOPY;   Service: Endoscopy;  Laterality: N/A;   CORONARY ANGIOPLASTY     CORONARY ARTERY BYPASS GRAFT N/A 09/21/2021   Procedure: CORONARY ARTERY BYPASS GRAFTING (CABG)  X FOUR, ON PUMP, USING LEFT INTERNAL MAMMARY ARTERY AND RIGHT ENDOSCOPIC GREATER SAPHENOUS VEIN CONDUITS;  Surgeon: Corliss Skains, MD;  Location: [facility] OR;  Service: Open Heart Surgery;  Laterality: N/A;   ENDOVEIN HARVEST OF GREATER SAPHENOUS VEIN  09/21/2021   Procedure: ENDOVEIN HARVEST OF GREATER SAPHENOUS VEIN;  Surgeon: Corliss Skains, MD;  Location: [facility] OR;  Service: Open Heart Surgery;;   EXTRACORPOREAL SHOCK WAVE LITHOTRIPSY Right 11/21/2022   Procedure: EXTRACORPOREAL SHOCK WAVE LITHOTRIPSY (ESWL);  Surgeon: Rene Paci, MD;  Location: [facility];  Service: Urology;  Laterality: Right;   LEFT HEART CATH AND CORONARY ANGIOGRAPHY N/A 08/31/2021   Procedure: LEFT HEART CATH AND CORONARY ANGIOGRAPHY;  Surgeon: Lyn Records, MD;  Location: [facility] INVASIVE CV LAB;  Service: Cardiovascular;  Laterality: N/A;   LEFT HEART CATHETERIZATION WITH CORONARY ANGIOGRAM N/A 02/26/2014   Procedure: LEFT HEART CATHETERIZATION WITH CORONARY ANGIOGRAM;  Surgeon: Iran Ouch, MD;  Location: [facility] CATH LAB;  Service: Cardiovascular;  Laterality: N/A;   LEFT HEART CATHETERIZATION WITH CORONARY ANGIOGRAM N/A 03/10/2014   Procedure: LEFT HEART CATHETERIZATION WITH CORONARY ANGIOGRAM;  Surgeon: Corky Crafts, MD;  Location: [facility] CATH LAB;  Service: Cardiovascular;  Laterality: N/A;   RIGHT HEART CATH AND CORONARY/GRAFT ANGIOGRAPHY N/A 07/01/2022   Procedure: RIGHT HEART CATH AND CORONARY/GRAFT ANGIOGRAPHY;  Surgeon: Tonny Bollman, MD;  Location: [facility] INVASIVE CV LAB;  Service: Cardiovascular;  Laterality: N/A;   TEE WITHOUT CARDIOVERSION N/A 09/21/2021   Procedure: TRANSESOPHAGEAL ECHOCARDIOGRAM (TEE);  Surgeon: Corliss Skains, MD;  Location: [facility] OR;  Service: Open Heart Surgery;  Laterality: N/A;     Allergies  Allergies  Allergen Reactions   Metformin And Related     Pt feels that this causes constipation and will not take     Home Medications    Prior to Admission medications   Medication Sig Start Date End Date Taking? Authorizing Provider  acetaminophen (TYLENOL) 325 MG tablet Take 2 tablets (650 mg total) by mouth every 6 (six) hours as needed for mild pain, fever or headache. 09/28/21   Gold, Glenice Laine, PA-C  aspirin 81 MG EC tablet Take 1 tablet (81 mg total) by mouth daily. 01/10/22   Alver Sorrow, NP  blood glucose meter kit and supplies Dispense based on patient and insurance preference. Use up to four times daily as directed. (FOR ICD-10 E10.9, E11.9). 05/10/19   Copland, Gwenlyn Found, MD  dapagliflozin propanediol (FARXIGA) 5 MG TABS tablet Take 1 tablet (5 mg total) by mouth daily before breakfast. 08/01/22   Copland, Gwenlyn Found, MD  gabapentin (NEURONTIN) 300 MG capsule Take 300 mg by mouth as needed (pain).    [provider]  glucose blood (ONETOUCH ULTRA) test strip USE  STRIP TO CHECK GLUCOSE TWICE DAILY 05/13/22   Copland, Gwenlyn Found, MD  Insulin Pen Needle (TECHLITE PEN NEEDLES) 32G X  8 MM MISC Use daily to administer insulin 05/13/22   Copland, Gwenlyn Found, MD  isosorbide mononitrate (IMDUR) 30 MG 24 hr tablet Take 1 tablet (30 mg total) by mouth daily. You can take an additional half tablet as needed for chest pain 10/07/22   Gaston Islam., NP  Lancets [facility] DELICA PLUS [place]) MISC Check blood sugars twice daily 07/20/22   Copland, Gwenlyn Found, MD  LANTUS 100 UNIT/ML injection INJECT 22 TO 27 UNITS SUBCUTANEOUSLY ONCE DAILY 02/03/23   Copland, Gwenlyn Found, MD  lisinopril (ZESTRIL) 20 MG tablet Take 20 mg by mouth daily. Patient taking 1/2 tablet daily 03/14/22   [provider]  metoprolol tartrate (LOPRESSOR) 25 MG tablet Take 1 tablet (25 mg total) by mouth 2 (two) times daily. 10/12/21   Alver Sorrow, NP  morphine (MSIR) 15 MG tablet Take  0.5 tablets (7.5 mg total) by mouth every 4 (four) hours as needed for severe pain. 09/30/22   Melene Plan, DO  nitroGLYCERIN (NITROSTAT) 0.4 MG SL tablet Place 1 tablet (0.4 mg total) under the tongue every 5 (five) minutes as needed for chest pain. 07/28/22   Copland, Gwenlyn Found, MD  ofloxacin (FLOXIN OTIC) 0.3 % OTIC solution Place 10 drops into the right ear daily. Use for 7- 10 days as needed 07/28/22   Copland, Gwenlyn Found, MD  ondansetron (ZOFRAN-ODT) 4 MG disintegrating tablet 4mg  ODT q4 hours prn nausea/vomit 09/30/22   Melene Plan, DO  oxyCODONE-acetaminophen (PERCOCET) 5-325 MG tablet Take 1 tablet by mouth every 4 (four) hours as needed for severe pain. 11/21/22   Rene Paci, MD  rosuvastatin (CRESTOR) 40 MG tablet Take 1 tablet by mouth once daily 01/23/23   Tonny Bollman, MD    Physical Exam    Vital Signs:  Rob Agner does not have vital signs available for review today.  Given telephonic nature of communication, physical exam is limited. AAOx3. NAD. Normal affect.  Speech and respirations are unlabored.  Accessory Clinical Findings    None  Assessment & Plan    1.  Preoperative Cardiovascular Risk Assessment: Cystoscopy, right retrograde pyelogram, diagnostic ureteroscopy, laser lithotripsy, stenting 03/28/2023, Dr. Kasandra Knudsen, [facility], fax #[phone]    Primary Cardiologist: Tonny Bollman, MD  Chart reviewed as part of pre-operative protocol coverage. Given past medical history and time since last visit, based on ACC/AHA guidelines, Aly Quebedeaux would be at acceptable risk for the planned procedure without further cardiovascular testing.   His RCRI is a class IV risk, 11% risk of major cardiac event.  He is able to complete greater than 4 METS of physical activity.  From a cardiac standpoint if necessary he may hold his aspirin for 5 to 7 days prior to his procedure.  Please resume as soon as hemostasis is achieved.  Patient was advised that if he  develops new symptoms prior to surgery to contact our office to arrange a follow-up appointment.  He verbalized understanding.  Patient also has history of CVA/TIA.  Recommendation should also be given from PCP/neurology related to aspirin hold.  I will route this recommendation to the requesting party via Epic fax function and remove from pre-op pool.       Time:   Today, I have spent 6 minutes with the patient with telehealth technology discussing medical history, symptoms, and management plan.  Prior to his phone evaluation I spent greater than 10 minutes reviewing his past medical history and cardiac medications.   Ronney Asters, NP  03/20/2023, 10:52 AM

## 2023-03-21 ENCOUNTER — Other Ambulatory Visit: Payer: Self-pay

## 2023-03-21 ENCOUNTER — Encounter (HOSPITAL_COMMUNITY)
Admission: RE | Admit: 2023-03-21 | Discharge: 2023-03-21 | Disposition: A | Discharge status: Home / Self Care | Payer: Medicare Other | Source: Ambulatory Visit | Attending: Cardiology | Admitting: Cardiology

## 2023-03-21 ENCOUNTER — Encounter (HOSPITAL_COMMUNITY): Payer: Self-pay

## 2023-03-21 ENCOUNTER — Ambulatory Visit (INDEPENDENT_AMBULATORY_CARE_PROVIDER_SITE_OTHER): Payer: Medicare Other

## 2023-03-21 VITALS — BP 150/77 | HR 75 | Temp 98.4°F | Resp 18 | Ht 65.0 in | Wt 150.0 lb

## 2023-03-21 DIAGNOSIS — Z951 Presence of aortocoronary bypass graft: Secondary | ICD-10-CM | POA: Diagnosis not present

## 2023-03-21 DIAGNOSIS — Z01810 Encounter for preprocedural cardiovascular examination: Secondary | ICD-10-CM | POA: Diagnosis not present

## 2023-03-21 DIAGNOSIS — E119 Type 2 diabetes mellitus without complications: Secondary | ICD-10-CM

## 2023-03-21 DIAGNOSIS — N1330 Unspecified hydronephrosis: Secondary | ICD-10-CM | POA: Insufficient documentation

## 2023-03-21 DIAGNOSIS — I130 Hypertensive heart and chronic kidney disease with heart failure and stage 1 through stage 4 chronic kidney disease, or unspecified chronic kidney disease: Secondary | ICD-10-CM | POA: Diagnosis not present

## 2023-03-21 DIAGNOSIS — N183 Chronic kidney disease, stage 3 unspecified: Secondary | ICD-10-CM | POA: Diagnosis not present

## 2023-03-21 DIAGNOSIS — Z794 Long term (current) use of insulin: Secondary | ICD-10-CM | POA: Diagnosis not present

## 2023-03-21 DIAGNOSIS — E1165 Type 2 diabetes mellitus with hyperglycemia: Secondary | ICD-10-CM | POA: Diagnosis not present

## 2023-03-21 DIAGNOSIS — I5032 Chronic diastolic (congestive) heart failure: Secondary | ICD-10-CM | POA: Diagnosis not present

## 2023-03-21 DIAGNOSIS — I25119 Atherosclerotic heart disease of native coronary artery with unspecified angina pectoris: Secondary | ICD-10-CM | POA: Diagnosis not present

## 2023-03-21 DIAGNOSIS — Z01812 Encounter for preprocedural laboratory examination: Secondary | ICD-10-CM | POA: Diagnosis not present

## 2023-03-21 DIAGNOSIS — E1122 Type 2 diabetes mellitus with diabetic chronic kidney disease: Secondary | ICD-10-CM | POA: Diagnosis not present

## 2023-03-21 DIAGNOSIS — Z8673 Personal history of transient ischemic attack (TIA), and cerebral infarction without residual deficits: Secondary | ICD-10-CM | POA: Diagnosis not present

## 2023-03-21 HISTORY — DX: Personal history of urinary calculi: Z87.442

## 2023-03-21 HISTORY — DX: Unspecified osteoarthritis, unspecified site: M19.90

## 2023-03-21 LAB — CBC
HCT: 55.9 % — ABNORMAL HIGH (ref 39.0–52.0)
Hemoglobin: 17.6 g/dL — ABNORMAL HIGH (ref 13.0–17.0)
MCH: 27.3 pg (ref 26.0–34.0)
MCHC: 31.5 g/dL (ref 30.0–36.0)
MCV: 86.8 fL (ref 80.0–100.0)
Platelets: 146 10*3/uL — ABNORMAL LOW (ref 150–400)
RBC: 6.44 MIL/uL — ABNORMAL HIGH (ref 4.22–5.81)
RDW: 13.3 % (ref 11.5–15.5)
WBC: 7 10*3/uL (ref 4.0–10.5)
nRBC: 0 % (ref 0.0–0.2)

## 2023-03-21 LAB — BASIC METABOLIC PANEL
Anion gap: 7 (ref 5–15)
BUN: 18 mg/dL (ref 8–23)
CO2: 27 mmol/L (ref 22–32)
Calcium: 9.7 mg/dL (ref 8.9–10.3)
Chloride: 101 mmol/L (ref 98–111)
Creatinine, Ser: 1.45 mg/dL — ABNORMAL HIGH (ref 0.61–1.24)
GFR, Estimated: 51 mL/min — ABNORMAL LOW (ref >60)
Glucose, Bld: 279 mg/dL — ABNORMAL HIGH (ref 70–99)
Potassium: 4.3 mmol/L (ref 3.5–5.1)
Sodium: 135 mmol/L (ref 135–145)

## 2023-03-21 LAB — GLUCOSE, CAPILLARY: Glucose-Capillary: 290 mg/dL — ABNORMAL HIGH (ref 70–99)

## 2023-03-21 LAB — HEMOGLOBIN A1C
Hgb A1c MFr Bld: 10.1 % — ABNORMAL HIGH (ref 4.8–5.6)
Mean Plasma Glucose: 243.17 mg/dL

## 2023-03-22 ENCOUNTER — Other Ambulatory Visit: Payer: Self-pay | Admitting: Family Medicine

## 2023-03-22 ENCOUNTER — Encounter: Payer: Self-pay | Admitting: Family Medicine

## 2023-03-22 NOTE — Progress Notes (Signed)
Anesthesia chart review   Case: [id number] Date/Time: 03/28/23 1215   Procedure: CYSTOSCOPY, RIGHT RETROGRADE PYELOGRAM, DIAGNOSTIC RIGHT URETEROSCOPY, POSSIBLE HOLMIUM LASER LITHOTRIPSY, AND RIGHT URETERAL STENT PLACEMENT (Right)   Anesthesia type: General   Pre-op diagnosis: RIGHT HYDRONEPHROSIS   [facility]: [facility] PROC[facility] / [facility]   Surgeons: Noel Christmas, MD       DISCUSSION: 72 year old former smoker with history of hypertension, diabetes type 2, CAD (DES 2015, CABG 2022), CHF, PAD, CKD stage III, right hydronephrosis scheduled for above procedure 03/28/2023 with Dr. Roselee Nova pace.  Poorly controlled diabetes with A1c 10.1 at PAT visit on 03/21/2023.  CBG 290.  Given that this is not an elective case will evaluate day of surgery.  Diabetes medication instructions given by PAT nurse.  Labs forwarded to surgeon and PCP.  Per cardiology preoperative evaluation 03/21/2023, "Chart reviewed as part of pre-operative protocol coverage. Given past medical history and time since last visit, based on ACC/AHA guidelines, Barry [name] would be at acceptable risk for the planned procedure without further cardiovascular testing.    His RCRI is a class IV risk, 11% risk of major cardiac event.  He is able to complete greater than 4 METS of physical activity.   From a cardiac standpoint if necessary he may hold his aspirin for 5 to 7 days prior to his procedure.  Please resume as soon as hemostasis is achieved." VS: BP (!) 150/77 Comment: right arm sitting  Pulse 75   Temp 36.9 C (Oral)   Resp 18   Ht 5\' 5"  (1.651 m)   Wt 68 kg   SpO2 99%   BMI 24.96 kg/m   PROVIDERS: Copland, Gwenlyn Found, MD is PCP   LABS: Labs reviewed: Repeat  (all labs ordered are listed, but only abnormal results are displayed)  Labs Reviewed  HEMOGLOBIN A1C - Abnormal; Notable for the following components:      Result Value   Hgb A1c MFr Bld 10.1 (*)    All other components within normal limits  BASIC METABOLIC  PANEL - Abnormal; Notable for the following components:   Glucose, Bld 279 (*)    Creatinine, Ser 1.45 (*)    GFR, Estimated 51 (*)    All other components within normal limits  CBC - Abnormal; Notable for the following components:   RBC 6.44 (*)    Hemoglobin 17.6 (*)    HCT 55.9 (*)    Platelets 146 (*)    All other components within normal limits  GLUCOSE, CAPILLARY - Abnormal; Notable for the following components:   Glucose-Capillary 290 (*)    All other components within normal limits     IMAGES:   EKG:   CV: Echo 07/14/2021 1. Frequent PVCs during exam.   2. Left ventricular ejection fraction, by estimation, is 40 to 45% with  beat to beat variability. The left ventricle has mildly decreased  function. The left ventricle demonstrates regional wall motion  abnormalities (see scoring diagram/findings for  description). There is mild left ventricular hypertrophy. Left ventricular  diastolic parameters are consistent with Grade I diastolic dysfunction  (impaired relaxation).   3. Right ventricular systolic function is mildly reduced. The right  ventricular size is normal. There is normal pulmonary artery systolic  pressure. The estimated right ventricular systolic pressure is 26.6 mmHg.   4. The mitral valve is grossly normal. Trivial mitral valve  regurgitation. No evidence of mitral stenosis.   5. The aortic valve is grossly normal. There is mild calcification  of the  aortic valve. Aortic valve regurgitation is trivial. No aortic stenosis is  present.   6. The inferior vena cava is normal in size with greater than 50%  respiratory variability, suggesting right atrial pressure of 3 mmHg.  Past Medical History:  Diagnosis Date   Anginal pain (HCC)    Arthritis    CAD (coronary artery disease)    LHC (02/26/14):  >> PCI:  Xience DES to pRCA // LHC (03/10/14):  >> PCI:  Xience DES to [facility] // Myoview 2/16: low risk // Cath 2016: patent stents; stable dz - med Rx // Myoview  10/22: anterolateral ischemia, inferoapical infarct w peri infarct ischemia; EF 41; High Risk // Cath 12/22: LCx stent occl, severe LAD and Dx dz, diffuse RCA disease >> CABG   Carotid stenosis    a. Carotid US (02/27/14):  Bilateral ICA 1-39%   CKD (chronic kidney disease) stage 3    Depression    Diabetes mellitus    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HCC)    a. Mesenteric Artery Duplex (7/15):  pSMA chronic dissection with aneurysmal dilation of 1.29 cm (VVS);  b.  Chest CTA (02/26/14):  IMPRESSION:  1. No aortic  dissection or other acute abnormality.  2. 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  3. Stable 17 mm ectasia of the left common iliac artery.  4. Atherosclerosis, including aortoiliac and coronary artery disease.    HFmrEF (heart failure with mildly reduced EF) 09/2015   Echo 6/15: EF 50-55 // Echo 1/17: mod LVH, EF 55%, inf-lat HK, Gr 1 DD, mild LAE // Echocardiogram 10/22: EF 40-45, Gr 1 DD, mildly reduced RVSF, RVSP 26.6, trivial MR, trivial AI, post and ant-lat HK   History of kidney stones    Hyperlipidemia    Hypertension    Iliac artery aneurysm, left (HCC)    Myocardial infarction (HCC)    PVC's (premature ventricular contractions) 07/21/2021   Monitor 10/22:  NSR avg HR 71; no AF/Flutter; no high grade HB or pathologic pauses PVC burden 20%, rare supraventricular beats w/o sustained arrhythmia    Stroke (HCC) 09/26/2009    Past Surgical History:  Procedure Laterality Date   CARDIAC CATHETERIZATION     CARDIAC CATHETERIZATION N/A 06/25/2015   Procedure: Left Heart Cath and Coronary Angiography;  Surgeon: Kathleene Hazel, MD;  Location: [facility] INVASIVE CV LAB;  Service: Cardiovascular;  Laterality: N/A;   COLONOSCOPY WITH PROPOFOL N/A 07/23/2015   Procedure: COLONOSCOPY WITH PROPOFOL;  Surgeon: Rachael Fee, MD;  Location: [facility];  Service: Endoscopy;  Laterality: N/A;   CORONARY ANGIOPLASTY     CORONARY ARTERY BYPASS GRAFT N/A 09/21/2021   Procedure:  CORONARY ARTERY BYPASS GRAFTING (CABG)  X FOUR, ON PUMP, USING LEFT INTERNAL MAMMARY ARTERY AND RIGHT ENDOSCOPIC GREATER SAPHENOUS VEIN CONDUITS;  Surgeon: Corliss Skains, MD;  Location: [facility] OR;  Service: Open Heart Surgery;  Laterality: N/A;   ENDOVEIN HARVEST OF GREATER SAPHENOUS VEIN  09/21/2021   Procedure: ENDOVEIN HARVEST OF GREATER SAPHENOUS VEIN;  Surgeon: Corliss Skains, MD;  Location[facility];  Service: Open Heart Surgery;;   EXTRACORPOREAL SHOCK WAVE LITHOTRIPSY Right 11/21/2022   Procedure: EXTRACORPOREAL SHOCK WAVE LITHOTRIPSY (ESWL);  Surgeon: Rene Paci, MD;  Location: [facility];  Service: Urology;  Laterality: Right;   LEFT HEART CATH AND CORONARY ANGIOGRAPHY N/A 08/31/2021   Procedure: LEFT HEART CATH AND CORONARY ANGIOGRAPHY;  Surgeon: Lyn Records, MD;  [facility] INVASIVE CV LAB;  Service:  Cardiovascular;  Laterality: N/A;   LEFT HEART CATHETERIZATION WITH CORONARY ANGIOGRAM N/A 02/26/2014   Procedure: LEFT HEART CATHETERIZATION WITH CORONARY ANGIOGRAM;  Surgeon: Iran Ouch, MD;  Location: [facility] CATH LAB;  Service: Cardiovascular;  Laterality: N/A;   LEFT HEART CATHETERIZATION WITH CORONARY ANGIOGRAM N/A 03/10/2014   Procedure: LEFT HEART CATHETERIZATION WITH CORONARY ANGIOGRAM;  Surgeon: Corky Crafts, MD;  Location: [facility] CATH LAB;  Service: Cardiovascular;  Laterality: N/A;   RIGHT HEART CATH AND CORONARY/GRAFT ANGIOGRAPHY N/A 07/01/2022   Procedure: RIGHT HEART CATH AND CORONARY/GRAFT ANGIOGRAPHY;  Surgeon: Tonny Bollman, MD;  Location: [facility] INVASIVE CV LAB;  Service: Cardiovascular;  Laterality: N/A;   TEE WITHOUT CARDIOVERSION N/A 09/21/2021   Procedure: TRANSESOPHAGEAL ECHOCARDIOGRAM (TEE);  Surgeon: Corliss Skains, MD;  Location: [facility] OR;  Service: Open Heart Surgery;  Laterality: N/A;    MEDICATIONS:  blood glucose meter kit and supplies   dapagliflozin propanediol (FARXIGA) 5 MG TABS tablet   gabapentin (NEURONTIN) 300  MG capsule   glucose blood (ONETOUCH ULTRA) test strip   Insulin Pen Needle (TECHLITE PEN NEEDLES) 32G X 8 MM MISC   isosorbide mononitrate (IMDUR) 30 MG 24 hr tablet   Lancets (ONETOUCH DELICA PLUS LANCET33G) MISC   LANTUS 100 UNIT/ML injection   metoprolol tartrate (LOPRESSOR) 25 MG tablet   morphine (MSIR) 15 MG tablet   nitroGLYCERIN (NITROSTAT) 0.4 MG SL tablet   ofloxacin (FLOXIN OTIC) 0.3 % OTIC solution   ondansetron (ZOFRAN-ODT) 4 MG disintegrating tablet   oxyCODONE-acetaminophen (PERCOCET) 5-325 MG tablet   rosuvastatin (CRESTOR) 40 MG tablet   No current facility-administered medications for this encounter.    Jodell Cipro Ward, PA-C [facility] [phone]

## 2023-03-24 NOTE — H&P (Signed)
CC/HPI: cc: urolithiasis   10/28/22: 72 year old man with 9 mm mid right ureteral calculus seen on imaging in early January 2024. He is here today with a family number who provides most of the history. This is patient's first kidney stone episode. He is experiencing some right low back pain. No nausea, vomiting, or fevers/chills. He does have microscopic hematuria on UA today. He is here with his son who provides most of the history.   12/12/2022: Returns for f/u KUB after right ESWL on December 02, 2022. Has passed a large volume of stone material and brings it in today. He is here with his son who helps with translation services. Patient is complaining of some right hip pain with radiation down his leg. He had some abdominal pain and discomfort last week and had a KUB ordered by primary care. This showed the possibility of a retained but significantly smaller right ureteral calculi which is not well-appreciated on today's exam. Patient is not having any dysuria, gross hematuria. No continued microscopic hematuria on today's UA.   02/03/2023: 72 year old man with a history of urolithiasis here for follow-up. He had a right ESWL in February 2024 for a 9 mm right mid ureteral calculus. He passed several stone fragments and repeat KUB showed resolution of stone fragment. Patient continues to experience right low back pain as well as right leg pain. He is also very tired. He denies any gross hematuria. His urinalysis today is normal. He is here today with his son. A video interpreter was used for [employer].   03/15/2023: 72 year old man with a history of urolithiasis who underwent ESWL in February 2024 here for follow-up. Patient continues to have pain in the right flank area. He has not had any hematuria or passage of stones. He did have a CT of abdomen pelvis with contrast from May 2024 that showed asymmetric right hydronephrosis to the level of the iliac vessels. CTof the abdomen pelvis today shows slight improvement  in right hydronephrosis but still some renal pelvis fullness. Patient is here today with his son [name]. He was unable to hear/understand the video interpreter services today.     ALLERGIES: Metformin    MEDICATIONS: Lisinopril 20 mg tablet  Metoprolol Succinate 25 mg tablet, extended release 24 hr  Tamsulosin Hcl 0.4 mg capsule  Aspirin Regimen 81 mg tablet, delayed release  Farxiga 5 mg tablet  Gabapentin 300 mg capsule  Isosorbide Mononitrate Er 30 mg tablet, extended release 24 hr  Ofloxacin 0.3 % drops  Ondansetron Hcl 4 mg tablet  Rosuvastatin Calcium 40 mg tablet  Tylenol  Tylenol 325 mg capsule     GU PSH: ESWL - 12/02/2022       PSH Notes: Dissection of mesenteric artery, colonoscopy with propofol (2016), coronary angioplasty, Endovein harvest of greater saphenous vein, left heart cath (2015), right heart cath (2023), TEE without cardioversion (2022)   NON-GU PSH: CABG (coronary artery bypass grafting), x4 Iliac Artery Angiography Performed At The Same Time Of Cardiac Catheterization, Includes Catheter Pl, 2016     GU PMH: Hydronephrosis - 02/03/2023 Low back pain - 02/03/2023 Renal calculus - 02/03/2023 Ureteral calculus - 12/12/2022, - 10/28/2022      PMH Notes: Carotid stenosis, lliac artery aneurysm, left, PVC's, HFmrEF, atherosclerotic peripheral vascular disease with ulceration   NON-GU PMH: Congestive heart failure Coronary Artery Disease Depression Diabetes Type 2 Hypercholesterolemia Hypertension Myocardial Infarction Stroke/TIA    FAMILY HISTORY: Diabetes - Father Heart Attack - Father Hypertension - Father   SOCIAL HISTORY: Marital  Status: Unknown Ethnicity: ; Race: Unknown Current Smoking Status: Patient does not smoke anymore. Has not smoked since 10/27/1984. Smoked 1/2 pack per day.   Tobacco Use Assessment Completed: Used Tobacco in last 30 days? Has never drank.  Does not drink caffeine.     Notes: Pt is [place]   REVIEW OF SYSTEMS:     GU Review Male:   Patient denies frequent urination, hard to postpone urination, burning/ pain with urination, get up at night to urinate, leakage of urine, stream starts and stops, trouble starting your stream, have to strain to urinate , erection problems, and penile pain.  Gastrointestinal (Upper):   Patient denies nausea, vomiting, and indigestion/ heartburn.  Gastrointestinal (Lower):   Patient denies diarrhea and constipation.  Constitutional:   Patient denies fever, night sweats, weight loss, and fatigue.  Skin:   Patient denies itching and skin rash/ lesion.  Eyes:   Patient denies blurred vision and double vision.  Ears/ Nose/ Throat:   Patient denies sore throat and sinus problems.  Hematologic/Lymphatic:   Patient denies swollen glands and easy bruising.  Cardiovascular:   Patient denies leg swelling and chest pains.  Respiratory:   Patient denies cough and shortness of breath.  Endocrine:   Patient denies excessive thirst.  Musculoskeletal:   Patient denies back pain and joint pain.  Neurological:   Patient denies headaches and dizziness.  Psychologic:   Patient denies depression and anxiety.   VITAL SIGNS:      03/15/2023 09:54 AM  BP 155/83 mmHg  Pulse 63 /min   MULTI-SYSTEM PHYSICAL EXAMINATION:    Constitutional: Well-nourished. No physical deformities. Normally developed. Good grooming.  Neck: Neck symmetrical, not swollen. Normal tracheal position.  Respiratory: No labored breathing, no use of accessory muscles.   Skin: No paleness, no jaundice, no cyanosis. No lesion, no ulcer, no rash.  Neurologic / Psychiatric: Oriented to time, oriented to place, oriented to person. No depression, no anxiety, no agitation.  Eyes: Normal conjunctivae. Normal eyelids.  Ears, Nose, Mouth, and Throat: Left ear no scars, no lesions, no masses. Right ear no scars, no lesions, no masses. Nose no scars, no lesions, no masses. Normal hearing. Normal lips.  Musculoskeletal: Normal gait and  station of head and neck.     Complexity of Data:  Records Review:   Previous Patient Records, POC Tool  Urine Test Review:   Urinalysis  X-Ray Review: C.T. Abdomen/Pelvis: Reviewed Report. Discussed With Patient. IMPRESSION:  1. There is mild fullness in the right renal collecting system along  with a component of extrarenal pelvis. There is interval improvement  in the degree of dilation when compared to the prior exam. No  obstructing mass or calculi seen within the limitations of this  exam.  2. Multiple other nonacute observations (such as cholelithiasis,  stable left renal cyst, colonic diverticulosis, prostatomegaly,  etc.), as described above.    Electronically Signed  By: Jules Schick M.D.  On: 03/15/2023 09:41   Notes:                     02/10/2023: BUN 28, creatinine 1.44   PROCEDURES:         C.T. ABD-Pelv w/o - 16109      Patient confirmed No Neulasta OnPro Device.   ASSESSMENT:      ICD-10 Details  1 GU:   Hydronephrosis - N13.0 Chronic, Stable  2   Low back pain - M54.5 Chronic, Stable   PLAN:  Document Letter(s):  Created for Patient: Clinical Summary         Notes:   Right hydronephrosis/flank pain:  -I reviewed patient's CT scan as well as CT from May which shows improvement in amount of hydronephrosis on the right-hand side  -Kidney function has also been variable but is currently stable  -I discussed with patient observation versus diagnostic ureteroscopy, retrograde pyelogram and stent placement to look for recurrent stone or stricture  -Risks and benefits of the procedure were discussed with the patient and his son today and they would like to proceed   He will be scheduled for next available surgery date

## 2023-03-28 ENCOUNTER — Ambulatory Visit (HOSPITAL_COMMUNITY): Payer: Medicare Other | Admitting: Physician Assistant

## 2023-03-28 ENCOUNTER — Encounter (HOSPITAL_COMMUNITY)
Admission: RE | Disposition: A | Discharge status: Home / Self Care | Payer: Self-pay | Source: Ambulatory Visit | Attending: Urology

## 2023-03-28 ENCOUNTER — Ambulatory Visit (HOSPITAL_BASED_OUTPATIENT_CLINIC_OR_DEPARTMENT_OTHER): Payer: Medicare Other | Admitting: Certified Registered"

## 2023-03-28 ENCOUNTER — Ambulatory Visit (HOSPITAL_COMMUNITY)
Admission: RE | Admit: 2023-03-28 | Discharge: 2023-03-28 | Disposition: A | Discharge status: Home / Self Care | Payer: Medicare Other | Source: Ambulatory Visit | Attending: Urology | Admitting: Urology

## 2023-03-28 ENCOUNTER — Ambulatory Visit (HOSPITAL_COMMUNITY): Payer: Medicare Other

## 2023-03-28 ENCOUNTER — Other Ambulatory Visit: Payer: Self-pay

## 2023-03-28 ENCOUNTER — Encounter (HOSPITAL_COMMUNITY): Payer: Self-pay | Admitting: Urology

## 2023-03-28 DIAGNOSIS — Z8673 Personal history of transient ischemic attack (TIA), and cerebral infarction without residual deficits: Secondary | ICD-10-CM | POA: Insufficient documentation

## 2023-03-28 DIAGNOSIS — N1330 Unspecified hydronephrosis: Secondary | ICD-10-CM

## 2023-03-28 DIAGNOSIS — Z79899 Other long term (current) drug therapy: Secondary | ICD-10-CM | POA: Diagnosis not present

## 2023-03-28 DIAGNOSIS — E1151 Type 2 diabetes mellitus with diabetic peripheral angiopathy without gangrene: Secondary | ICD-10-CM | POA: Diagnosis not present

## 2023-03-28 DIAGNOSIS — Z87442 Personal history of urinary calculi: Secondary | ICD-10-CM | POA: Insufficient documentation

## 2023-03-28 DIAGNOSIS — Z951 Presence of aortocoronary bypass graft: Secondary | ICD-10-CM | POA: Insufficient documentation

## 2023-03-28 DIAGNOSIS — N368 Other specified disorders of urethra: Secondary | ICD-10-CM | POA: Insufficient documentation

## 2023-03-28 DIAGNOSIS — Z87891 Personal history of nicotine dependence: Secondary | ICD-10-CM | POA: Diagnosis not present

## 2023-03-28 DIAGNOSIS — M1990 Unspecified osteoarthritis, unspecified site: Secondary | ICD-10-CM | POA: Insufficient documentation

## 2023-03-28 DIAGNOSIS — G8929 Other chronic pain: Secondary | ICD-10-CM | POA: Diagnosis not present

## 2023-03-28 DIAGNOSIS — I2510 Atherosclerotic heart disease of native coronary artery without angina pectoris: Secondary | ICD-10-CM | POA: Insufficient documentation

## 2023-03-28 DIAGNOSIS — N183 Chronic kidney disease, stage 3 unspecified: Secondary | ICD-10-CM | POA: Diagnosis not present

## 2023-03-28 DIAGNOSIS — Z7984 Long term (current) use of oral hypoglycemic drugs: Secondary | ICD-10-CM | POA: Insufficient documentation

## 2023-03-28 DIAGNOSIS — I509 Heart failure, unspecified: Secondary | ICD-10-CM | POA: Diagnosis not present

## 2023-03-28 DIAGNOSIS — I130 Hypertensive heart and chronic kidney disease with heart failure and stage 1 through stage 4 chronic kidney disease, or unspecified chronic kidney disease: Secondary | ICD-10-CM

## 2023-03-28 DIAGNOSIS — E119 Type 2 diabetes mellitus without complications: Secondary | ICD-10-CM

## 2023-03-28 DIAGNOSIS — M545 Low back pain, unspecified: Secondary | ICD-10-CM | POA: Insufficient documentation

## 2023-03-28 DIAGNOSIS — I110 Hypertensive heart disease with heart failure: Secondary | ICD-10-CM | POA: Diagnosis not present

## 2023-03-28 DIAGNOSIS — I252 Old myocardial infarction: Secondary | ICD-10-CM | POA: Diagnosis not present

## 2023-03-28 DIAGNOSIS — Z955 Presence of coronary angioplasty implant and graft: Secondary | ICD-10-CM | POA: Diagnosis not present

## 2023-03-28 DIAGNOSIS — I5032 Chronic diastolic (congestive) heart failure: Secondary | ICD-10-CM | POA: Diagnosis not present

## 2023-03-28 HISTORY — PX: CYSTOSCOPY/URETEROSCOPY/HOLMIUM LASER/STENT PLACEMENT: SHX6546

## 2023-03-28 LAB — GLUCOSE, CAPILLARY
Glucose-Capillary: 153 mg/dL — ABNORMAL HIGH (ref 70–99)
Glucose-Capillary: 142 mg/dL — ABNORMAL HIGH (ref 70–99)

## 2023-03-28 SURGERY — CYSTOSCOPY/URETEROSCOPY/HOLMIUM LASER/STENT PLACEMENT
Anesthesia: General | Site: Ureter | Laterality: Right

## 2023-03-28 MED ORDER — MIDAZOLAM HCL 2 MG/2ML IJ SOLN
INTRAMUSCULAR | Status: AC
  Filled 2023-03-28: qty 2

## 2023-03-28 MED ORDER — FENTANYL CITRATE (PF) 100 MCG/2ML IJ SOLN
INTRAMUSCULAR | Status: DC | PRN
  Administered 2023-03-28: 50 ug via INTRAVENOUS

## 2023-03-28 MED ORDER — FENTANYL CITRATE (PF) 100 MCG/2ML IJ SOLN
INTRAMUSCULAR | Status: AC
  Filled 2023-03-28: qty 2

## 2023-03-28 MED ORDER — 0.9 % SODIUM CHLORIDE (POUR BTL) OPTIME
TOPICAL | Status: DC | PRN
  Administered 2023-03-28: 1000 mL

## 2023-03-28 MED ORDER — ORAL CARE MOUTH RINSE: 15.0000 mL | Freq: Once | OROMUCOSAL | Status: DC

## 2023-03-28 MED ORDER — PROPOFOL 10 MG/ML IV BOLUS
INTRAVENOUS | Status: DC | PRN
  Administered 2023-03-28: 10 mg via INTRAVENOUS

## 2023-03-28 MED ORDER — CEFAZOLIN SODIUM-DEXTROSE 2-4 GM/100ML-% IV SOLN
2.0000 g | INTRAVENOUS | Status: AC
  Administered 2023-03-28: 2 g via INTRAVENOUS
  Filled 2023-03-28: qty 100

## 2023-03-28 MED ORDER — MIDAZOLAM HCL 2 MG/2ML IJ SOLN
INTRAMUSCULAR | Status: DC | PRN
  Administered 2023-03-28: 2 mg via INTRAVENOUS

## 2023-03-28 MED ORDER — DEXAMETHASONE SODIUM PHOSPHATE 10 MG/ML IJ SOLN
INTRAMUSCULAR | Status: DC | PRN
  Administered 2023-03-28: 10 mg via INTRAVENOUS

## 2023-03-28 MED ORDER — ONDANSETRON HCL 4 MG/2ML IJ SOLN
INTRAMUSCULAR | Status: DC | PRN
  Administered 2023-03-28: 4 mg via INTRAVENOUS

## 2023-03-28 MED ORDER — LIDOCAINE 2% (20 MG/ML) 5 ML SYRINGE
INTRAMUSCULAR | Status: DC | PRN
  Administered 2023-03-28: 60 mg via INTRAVENOUS

## 2023-03-28 MED ORDER — CHLORHEXIDINE GLUCONATE 0.12 % MT SOLN: 15.0000 mL | Freq: Once | OROMUCOSAL | Status: DC

## 2023-03-28 MED ORDER — IOHEXOL 300 MG/ML  SOLN
INTRAMUSCULAR | Status: DC | PRN
  Administered 2023-03-28: 10 mL

## 2023-03-28 MED ORDER — SODIUM CHLORIDE 0.9 % IR SOLN
Status: DC | PRN
  Administered 2023-03-28: 3000 mL

## 2023-03-28 MED ORDER — INSULIN ASPART 100 UNIT/ML IJ SOLN: 0.0000 [IU] | INTRAMUSCULAR | Status: DC | PRN

## 2023-03-28 MED ORDER — LACTATED RINGERS IV SOLN: INTRAVENOUS | Status: DC

## 2023-03-28 SURGICAL SUPPLY — 24 items
BAG URO CATCHER STRL LF (MISCELLANEOUS) ×1 IMPLANT
BASKET ZERO TIP NITINOL 2.4FR (BASKET) IMPLANT
BSKT STON RTRVL ZERO TP 2.4FR (BASKET)
CATH URETL OPEN 5X70 (CATHETERS) ×1 IMPLANT
CLOTH BEACON ORANGE TIMEOUT ST (SAFETY) ×1 IMPLANT
DRSG TEGADERM 2-3/8X2-3/4 SM (GAUZE/BANDAGES/DRESSINGS) IMPLANT
EXTRACTOR STONE 1.7FRX115CM (UROLOGICAL SUPPLIES) IMPLANT
FIBER LASER MOSES 200 DFL (Laser) IMPLANT
FIBER LASER MOSES 365 DFL (Laser) IMPLANT
GLOVE BIO SURGEON STRL SZ 6.5 (GLOVE) ×1 IMPLANT
GOWN STRL REUS W/ TWL LRG LVL3 (GOWN DISPOSABLE) ×1 IMPLANT
GOWN STRL REUS W/TWL LRG LVL3 (GOWN DISPOSABLE) ×1
GUIDEWIRE ANG ZIPWIRE 035X150 (WIRE) IMPLANT
GUIDEWIRE STR DUAL SENSOR (WIRE) ×1 IMPLANT
KIT TURNOVER KIT A (KITS) IMPLANT
LASER FIB FLEXIVA PULSE ID 365 (Laser) IMPLANT
MANIFOLD NEPTUNE II (INSTRUMENTS) ×1 IMPLANT
PACK CYSTO (CUSTOM PROCEDURE TRAY) ×1 IMPLANT
SHEATH NAVIGATOR HD 11/13X28 (SHEATH) IMPLANT
SHEATH NAVIGATOR HD 11/13X36 (SHEATH) IMPLANT
TRACTIP FLEXIVA PULS ID 200XHI (Laser) IMPLANT
TRACTIP FLEXIVA PULSE ID 200 (Laser)
TUBING CONNECTING 10 (TUBING) ×1 IMPLANT
TUBING UROLOGY SET (TUBING) ×1 IMPLANT

## 2023-03-28 NOTE — Op Note (Signed)
Preoperative diagnosis: right hydronephrosis  Postoperative diagnosis: right hydronephrosis  Procedure:  Cystoscopy Right diagnostic ureteroscopy Right retrograde pyelography with interpretation  Surgeon: Kasandra Knudsen, MD  Anesthesia: General  Complications: None  Intraoperative findings:  Normal urethra Bilateral lobe hypertrophy prostatic urethra - friable mucosa Bilateral orthotropic ureteral orifices right retrograde pyelography demonstrated normal ureter (slightly dilated to level of bladder) without filling defect Bladder mucosa normal without masses   EBL: Minimal  Specimens: none  Disposition of specimens: none  Indication: Barry [name] is a 72 y.o.   patient with prior right ureteral calculus with persistent right hydronephrosis and flank pain. After reviewing the management options for treatment, the patient elected to proceed with the above surgical procedure(s). We have discussed the potential benefits and risks of the procedure, side effects of the proposed treatment, the likelihood of the patient achieving the goals of the procedure, and any potential problems that might occur during the procedure or recuperation. Informed consent has been obtained.   Description of procedure:  The patient was taken to the operating room and general anesthesia was induced.  The patient was placed in the dorsal lithotomy position, prepped and draped in the usual sterile fashion, and preoperative antibiotics were administered. A preoperative time-out was performed.   Cystourethroscopy was performed.  The patient's urethra was examined and demonstrated bilobar prostatic hypertrophy. The bladder was then systematically examined in its entirety. There was no evidence for any bladder tumors, stones, or other mucosal pathology.    Attention then turned to the right ureteral orifice and a ureteral catheter was used to intubate the ureteral orifice.  Omnipaque contrast was injected through  the ureteral catheter and a retrograde pyelogram was performed with findings as dictated above.  A 0.38 sensor guidewire was then advanced up the right ureter into the renal pelvis under fluoroscopic guidance. The 4.5Fr semirigid ureteroscope was then advanced into the ureter next to the guidewire to the UPJ. No stricture or stones were seen.   The bladder was then emptied and the procedure ended.  The patient appeared to tolerate the procedure well and without complications.  The patient was able to be awakened and transferred to the recovery unit in satisfactory condition.   Disposition: No stent was left. Right ureter was wide open.

## 2023-03-28 NOTE — Transfer of Care (Signed)
Immediate Anesthesia Transfer of Care Note  Patient: Barry [name]  Procedure(s) Performed: CYSTOSCOPY, RIGHT RETROGRADE PYELOGRAM, DIAGNOSTIC RIGHT URETEROSCOPY (Right: Ureter)  Patient Location: PACU  Anesthesia Type:General  Level of Consciousness: sedated  Airway & Oxygen Therapy: Patient Spontanous Breathing and Patient connected to face mask oxygen  Post-op Assessment: Report given to RN and Post -op Vital signs reviewed and stable  Post vital signs: Reviewed and stable  Last Vitals:  Vitals Value Taken Time  BP 96/63 03/28/23 1313  Temp    Pulse 68 03/28/23 1315  Resp 0 03/28/23 1315  SpO2 100 % 03/28/23 1315  Vitals shown include unvalidated device data.  Last Pain:  Vitals:   03/28/23 1106  TempSrc:   PainSc: 5          Complications: No notable events documented.

## 2023-03-28 NOTE — Anesthesia Postprocedure Evaluation (Signed)
Anesthesia Post Note  Patient: Barry [name]  Procedure(s) Performed: CYSTOSCOPY, RIGHT RETROGRADE PYELOGRAM, DIAGNOSTIC RIGHT URETEROSCOPY (Right: Ureter)     Patient location during evaluation: PACU Anesthesia Type: General Level of consciousness: awake Pain management: pain level controlled Vital Signs Assessment: post-procedure vital signs reviewed and stable Respiratory status: spontaneous breathing, nonlabored ventilation and respiratory function stable Cardiovascular status: blood pressure returned to baseline and stable Postop Assessment: no apparent nausea or vomiting Anesthetic complications: no   No notable events documented.  Last Vitals:  Vitals:   03/28/23 1415 03/28/23 1452  BP: 120/82 137/82  Pulse: (!) 54 (!) 54  Resp: 14 14  Temp:    SpO2: 97% 97%    Last Pain:  Vitals:   03/28/23 1452  TempSrc:   PainSc: 0-No pain                 Kegan Shepardson P Atticus Wedin

## 2023-03-28 NOTE — Interval H&P Note (Signed)
History and Physical Interval Note:  03/28/2023 12:09 PM  Barry [name]  has presented today for surgery, with the diagnosis of RIGHT HYDRONEPHROSIS.  The various methods of treatment have been discussed with the patient and family. After consideration of risks, benefits and other options for treatment, the patient has consented to  Procedure(s): CYSTOSCOPY, RIGHT RETROGRADE PYELOGRAM, DIAGNOSTIC RIGHT URETEROSCOPY, POSSIBLE HOLMIUM LASER LITHOTRIPSY, AND RIGHT URETERAL STENT PLACEMENT (Right) as a surgical intervention.  The patient's history has been reviewed, patient examined, no change in status, stable for surgery.  I have reviewed the patient's chart and labs.  Questions were answered to the patient's satisfaction.     Aeon Koors D Trafton Roker

## 2023-03-28 NOTE — Anesthesia Procedure Notes (Signed)
Procedure Name: LMA Insertion Date/Time: 03/28/2023 12:48 PM  Performed by: Donna Bernard, CRNAPre-anesthesia Checklist: Patient identified, Emergency Drugs available, Suction available, Patient being monitored and Timeout performed Patient Re-evaluated:Patient Re-evaluated prior to induction Oxygen Delivery Method: Circle system utilized Preoxygenation: Pre-oxygenation with 100% oxygen Induction Type: IV induction Ventilation: Mask ventilation without difficulty LMA Size: 4.0 Tube secured with: Tape Dental Injury: Teeth and Oropharynx as per pre-operative assessment

## 2023-03-28 NOTE — Discharge Instructions (Addendum)
Cystoscopy with Right Diagnostic Ureteroscopy patient instructions  Following a cystoscopy, a catheter (a flexible rubber tube) is sometimes left in place to empty the bladder. This may cause some discomfort or a feeling that you need to urinate. Your doctor determines the period of time that the catheter will be left in place. You may have bloody urine for two to three days (Call your doctor if the amount of bleeding increases or does not subside).  You may pass blood clots in your urine, especially if you had a biopsy. It is not unusual to pass small blood clots and have some bloody urine a couple of weeks after your cystoscopy. Again, call your doctor if the bleeding does not subside. You may have: Dysuria (painful urination) Frequency (urinating often) Urgency (strong desire to urinate)  These symptoms are common especially if medicine is instilled into the bladder or a ureteral stent is placed. Avoiding alcohol and caffeine, such as coffee, tea, and chocolate, may help relieve these symptoms. Drink plenty of water, unless otherwise instructed. Your doctor may also prescribe an antibiotic or other medicine to reduce these symptoms.  Cystoscopy results are available soon after the procedure; biopsy results usually take two to four days. Your doctor will discuss the results of your exam with you. Before you go home, you will be given specific instructions for follow-up care. Special Instructions:   If you are going home with a catheter in place do not take a tub bath until removed by your doctor.   You may resume your normal activities.   Do not drive or operate machinery if you are taking narcotic pain medicine.   Be sure to keep all follow-up appointments with your doctor.   Call Your Doctor If: The catheter is not draining You have severe pain You are unable to urinate You have a fever over 101 You have severe bleeding

## 2023-03-28 NOTE — Progress Notes (Signed)
   03/28/23 1052  Language Assistant  Interpreter Mode Video Remote Interpreter  Interpreter Name Samnang  Interpreter Phone Number - If applicable [phone]  Language & Interpreter (ONLY document if update needed)  Needs Interpreter Y  Preferred Language khm   Attempted to use [employer] interpreter services. Patient became very frustrated as he is [place]. Asked end video interpretation and have son - [name] interpret. Paperwork for family interpretation signed and in chart.

## 2023-03-28 NOTE — Anesthesia Preprocedure Evaluation (Signed)
Anesthesia Evaluation  Patient identified by MRN, date of birth, ID band Patient awake    Reviewed: Allergy & Precautions, NPO status , Patient's Chart, lab work & pertinent test results  Airway Mallampati: III  TM Distance: >3 FB Neck ROM: Full    Dental  (+) Edentulous Upper, Edentulous Lower   Pulmonary former smoker   Pulmonary exam normal        Cardiovascular hypertension, Pt. on home beta blockers + CAD, + Past MI, + Cardiac Stents, + CABG, + Peripheral Vascular Disease and +CHF  Normal cardiovascular exam     Neuro/Psych  PSYCHIATRIC DISORDERS  Depression    TIACVA    GI/Hepatic negative GI ROS, Neg liver ROS,,,  Endo/Other  diabetes, Insulin Dependent    Renal/GU Renal disease     Musculoskeletal  (+) Arthritis ,  Ambulates with cane and walker   Abdominal   Peds  Hematology negative hematology ROS (+)   Anesthesia Other Findings RIGHT HYDRONEPHROSIS  Reproductive/Obstetrics                             Anesthesia Physical Anesthesia Plan  ASA: 3  Anesthesia Plan: General   Post-op Pain Management:    Induction: Intravenous  PONV Risk Score and Plan: 2 and Ondansetron, Dexamethasone and Treatment may vary due to age or medical condition  Airway Management Planned: LMA  Additional Equipment:   Intra-op Plan:   Post-operative Plan: Extubation in OR  Informed Consent: I have reviewed the patients History and Physical, chart, labs and discussed the procedure including the risks, benefits and alternatives for the proposed anesthesia with the patient or authorized representative who has indicated his/her understanding and acceptance.     Dental advisory given  Plan Discussed with: CRNA  Anesthesia Plan Comments:        Anesthesia Quick Evaluation

## 2023-03-29 ENCOUNTER — Encounter (HOSPITAL_COMMUNITY): Payer: Self-pay | Admitting: Urology

## 2023-04-11 DIAGNOSIS — N130 Hydronephrosis with ureteropelvic junction obstruction: Secondary | ICD-10-CM | POA: Diagnosis not present

## 2023-04-18 ENCOUNTER — Telehealth: Payer: Self-pay | Admitting: Family Medicine

## 2023-04-18 ENCOUNTER — Encounter: Payer: Self-pay | Admitting: Family Medicine

## 2023-04-18 DIAGNOSIS — Z1211 Encounter for screening for malignant neoplasm of colon: Secondary | ICD-10-CM

## 2023-04-18 NOTE — Telephone Encounter (Signed)
[name] requesting an order for a colonoscopy for patient. Please call to advise

## 2023-04-18 NOTE — Telephone Encounter (Signed)
Per HM- colonoscopy is due 2026. Okay for referral?

## 2023-04-18 NOTE — Addendum Note (Signed)
Addended by: Abbe Amsterdam C on: 04/18/2023 04:42 PM   Modules accepted: Orders

## 2023-04-25 ENCOUNTER — Other Ambulatory Visit (HOSPITAL_BASED_OUTPATIENT_CLINIC_OR_DEPARTMENT_OTHER): Payer: Self-pay | Admitting: Cardiovascular Disease

## 2023-04-27 ENCOUNTER — Encounter: Payer: Self-pay | Admitting: Gastroenterology

## 2023-05-10 ENCOUNTER — Telehealth: Payer: Self-pay | Admitting: Family Medicine

## 2023-05-10 NOTE — Telephone Encounter (Signed)
Pt's daughter called and requested to have vaccinations ordered for shingles & the flu on behalf of the pt. Please call &  advise.

## 2023-05-11 NOTE — Telephone Encounter (Signed)
Barry [name] is aware and voices understanding.

## 2023-05-11 NOTE — Telephone Encounter (Signed)
Pt should not need orders right?

## 2023-05-11 NOTE — Telephone Encounter (Signed)
Tried calling Shanda Bumps back to let her know this. No answer and vm box was not set up yet.

## 2023-06-01 ENCOUNTER — Other Ambulatory Visit (HOSPITAL_BASED_OUTPATIENT_CLINIC_OR_DEPARTMENT_OTHER): Payer: Self-pay | Admitting: Cardiovascular Disease

## 2023-06-02 ENCOUNTER — Telehealth: Payer: Self-pay | Admitting: Family Medicine

## 2023-06-02 ENCOUNTER — Ambulatory Visit (AMBULATORY_SURGERY_CENTER): Payer: Medicare Other

## 2023-06-02 ENCOUNTER — Other Ambulatory Visit: Payer: Self-pay

## 2023-06-02 VITALS — Ht 65.0 in | Wt 158.0 lb

## 2023-06-02 DIAGNOSIS — E118 Type 2 diabetes mellitus with unspecified complications: Secondary | ICD-10-CM

## 2023-06-02 DIAGNOSIS — Z86010 Personal history of colonic polyps: Secondary | ICD-10-CM

## 2023-06-02 MED ORDER — LANTUS 100 UNIT/ML ~~LOC~~ SOLN
22.0000 [IU] | Freq: Every day | SUBCUTANEOUS | 0 refills | Status: DC
Start: 2023-06-02 — End: 2023-08-28

## 2023-06-02 MED ORDER — NA SULFATE-K SULFATE-MG SULF 17.5-3.13-1.6 GM/177ML PO SOLN: 1.0000 | Freq: Once | ORAL | 0 refills | Status: AC

## 2023-06-02 NOTE — Telephone Encounter (Signed)
Medication:  LANTUS 100 UNIT/ML injection also requesting to have more needles   Has the patient contacted their pharmacy? Yes.     Preferred Pharmacy:  [facility] [address] ([address] [place] [address] [place] ([place]) [place] [address] Phone: [phone]  Fax: [phone]

## 2023-06-02 NOTE — Progress Notes (Signed)
Pre visit completed in person with patient and son (interpreting for the patient; No on-site interpreter present; Patient verified name, DOB, and address;  No egg or soy allergy known to patient;  No issues known to pt with past sedation with any surgeries or procedures; Patient denies ever being told they had issues or difficulty with intubation;  No FH of Malignant Hyperthermia; Pt is not on diet pills; Pt is not on home 02;  Pt is not on blood thinners;  Pt denies issues with constipation - at times, make sure to increase water and activity;  No A fib or A flutter;  Have any cardiac testing pending--NO Insurance verified during PV appt--- [facility] Medicare  Pt can ambulate without assistance;  Pt denies use of chewing tobacco; Discussed diabetic/weight loss medication holds; Discussed NSAID holds; Checked BMI to be less than 50; Pt instructed to use Single[employer] or GoodRx for a price reduction on prep;  Patient's chart reviewed by Cathlyn Parsons CNRA prior to previsit and patient appropriate for the [facility];  Pre visit completed and red dot placed by patient's name on their procedure day (on provider's schedule).    Instructions printed and given to patient's son at [facility] appt; patient reported to son that he did NOT have any questions;

## 2023-06-02 NOTE — Telephone Encounter (Signed)
Rx has been refilled.  

## 2023-06-02 NOTE — Telephone Encounter (Signed)
Pt's medication was sent to pt's pharmacy as requested. Confirmation received.  °

## 2023-06-16 ENCOUNTER — Encounter: Payer: Self-pay | Admitting: Gastroenterology

## 2023-06-30 ENCOUNTER — Encounter: Payer: Self-pay | Admitting: Gastroenterology

## 2023-06-30 ENCOUNTER — Ambulatory Visit (AMBULATORY_SURGERY_CENTER): Payer: Medicare Other | Admitting: Gastroenterology

## 2023-06-30 VITALS — BP 104/56 | HR 76 | Temp 96.2°F | Resp 19 | Ht 65.0 in | Wt 158.0 lb

## 2023-06-30 DIAGNOSIS — Z860100 Personal history of colon polyps, unspecified: Secondary | ICD-10-CM

## 2023-06-30 DIAGNOSIS — I493 Ventricular premature depolarization: Secondary | ICD-10-CM | POA: Diagnosis not present

## 2023-06-30 DIAGNOSIS — D120 Benign neoplasm of cecum: Secondary | ICD-10-CM

## 2023-06-30 DIAGNOSIS — Z09 Encounter for follow-up examination after completed treatment for conditions other than malignant neoplasm: Secondary | ICD-10-CM

## 2023-06-30 DIAGNOSIS — D122 Benign neoplasm of ascending colon: Secondary | ICD-10-CM

## 2023-06-30 DIAGNOSIS — Z860101 Personal history of adenomatous and serrated colon polyps: Secondary | ICD-10-CM | POA: Diagnosis not present

## 2023-06-30 DIAGNOSIS — D123 Benign neoplasm of transverse colon: Secondary | ICD-10-CM

## 2023-06-30 DIAGNOSIS — E119 Type 2 diabetes mellitus without complications: Secondary | ICD-10-CM | POA: Diagnosis not present

## 2023-06-30 DIAGNOSIS — I10 Essential (primary) hypertension: Secondary | ICD-10-CM | POA: Diagnosis not present

## 2023-06-30 MED ORDER — SODIUM CHLORIDE 0.9 % IV SOLN
500.0000 mL | Freq: Once | INTRAVENOUS | Status: DC
Start: 2023-06-30 — End: 2023-06-30

## 2023-06-30 NOTE — Progress Notes (Signed)
Sedate, gd SR, tolerated procedure well, VSS, report to RN 

## 2023-06-30 NOTE — Progress Notes (Signed)
Called to room to assist during endoscopic procedure.  Patient ID and intended procedure confirmed with present staff. Received instructions for my participation in the procedure from the performing physician.  

## 2023-06-30 NOTE — Op Note (Signed)
[facility] Patient Name: Barry [name] Procedure Date: 06/30/2023 11:56 AM MRN: [id number] Endoscopist: Lorin Picket E. [name] , MD, [id number] Age: 72 Referring MD:  Date of Birth: 06-06-1951 Gender: Male Account #: [id number] Procedure:                Colonoscopy Indications:              Surveillance: Personal history of adenomatous                            polyps on last colonoscopy > 5 years ago Medicines:                Monitored Anesthesia Care Procedure:                Pre-Anesthesia Assessment:                           - Prior to the procedure, a History and Physical                            was performed, and patient medications and                            allergies were reviewed. The patient's tolerance of                            previous anesthesia was also reviewed. The risks                            and benefits of the procedure and the sedation                            options and risks were discussed with the patient.                            All questions were answered, and informed consent                            was obtained. Prior Anticoagulants: The patient has                            taken no anticoagulant or antiplatelet agents. ASA                            Grade Assessment: III - A patient with severe                            systemic disease. After reviewing the risks and                            benefits, the patient was deemed in satisfactory                            condition to undergo the procedure.  After obtaining informed consent, the colonoscope                            was passed under direct vision. Throughout the                            procedure, the patient's blood pressure, pulse, and                            oxygen saturations were monitored continuously. The                            CF [id number] #[id number] was introduced through the anus                            and advanced to  the the cecum, identified by                            appendiceal orifice and ileocecal valve. The                            colonoscopy was performed without difficulty. The                            patient tolerated the procedure well. The quality                            of the bowel preparation was adequate. The                            ileocecal valve, appendiceal orifice, and rectum                            were photographed. The bowel preparation used was                            SUPREP via split dose instruction. Scope In: 12:08:07 PM Scope Out: 12:25:48 PM Scope Withdrawal Time: 0 hours 13 minutes 33 seconds  Total Procedure Duration: 0 hours 17 minutes 41 seconds  Findings:                 The perianal and digital rectal examinations were                            normal. Pertinent negatives include normal                            sphincter tone and no palpable rectal lesions.                           A 3 mm polyp was found in the cecum. The polyp was                            sessile. The polyp was removed with  a cold snare.                            Resection and retrieval were complete. Estimated                            blood loss was minimal.                           Two sessile polyps were found in the ascending                            colon. The polyps were 3 to 4 mm in size. These                            polyps were removed with a cold snare. Resection                            and retrieval were complete. Estimated blood loss                            was minimal.                           A 3 mm polyp was found in the transverse colon. The                            polyp was sessile. The polyp was removed with a                            cold snare. Resection and retrieval were complete.                            Estimated blood loss was minimal.                           Multiple medium-mouthed and small-mouthed                             diverticula were found in the descending colon,                            transverse colon and ascending colon. There was no                            evidence of diverticular bleeding.                           The exam was otherwise normal throughout the                            examined colon.                           Non-bleeding internal hemorrhoids were found  during                            retroflexion. The hemorrhoids were Grade I                            (internal hemorrhoids that do not prolapse).                           No additional abnormalities were found on                            retroflexion. Complications:            No immediate complications. Estimated Blood Loss:     Estimated blood loss was minimal. Impression:               - One 3 mm polyp in the cecum, removed with a cold                            snare. Resected and retrieved.                           - Two 3 to 4 mm polyps in the ascending colon,                            removed with a cold snare. Resected and retrieved.                           - One 3 mm polyp in the transverse colon, removed                            with a cold snare. Resected and retrieved.                           - Moderate diverticulosis in the descending colon,                            in the transverse colon and in the ascending colon.                            There was no evidence of diverticular bleeding.                           - Non-bleeding internal hemorrhoids. Recommendation:           - Patient has a contact number available for                            emergencies. The signs and symptoms of potential                            delayed complications were discussed with the  patient. Return to normal activities tomorrow.                            Written discharge instructions were provided to the                            patient.                           - Resume  previous diet.                           - Continue present medications.                           - Await pathology results.                           - Based on patient's age, comorbidities and lack of                            high risk polyps, I would recommend against further                            colon cancer screening. Ameria Sanjurjo E. [name], MD 06/30/2023 12:32:12 PM This report has been signed electronically.

## 2023-06-30 NOTE — Progress Notes (Signed)
[facility] History and Physical   Primary Care Physician:  Copland, Gwenlyn Found, MD   Reason for Procedure:   Colon cancer screening, history of polyps  Plan:    Colonoscopy     HPI: Barry [name] is a 72 y.o. male undergoing initial surveillance colonoscopy.  He underwent a colonoscopy in 2016 by Dr. Christella Hartigan and had 2 small tubular adenomas removed.  He has no family history of colon cancer and no chronic GI symptoms.   His son is present and serves as Engineer, technical sales, although the patient is able to understand most of our conversation and speaks limited [place].   Past Medical History:  Diagnosis Date   Anginal pain (HCC)    Arthritis    CAD (coronary artery disease)    LHC (02/26/14):  >> PCI:  Xience DES to pRCA // LHC (03/10/14):  >> PCI:  Xience DES to [facility] // Myoview 2/16: low risk // Cath 2016: patent stents; stable dz - med Rx // Myoview 10/22: anterolateral ischemia, inferoapical infarct w peri infarct ischemia; EF 41; High Risk // Cath 12/22: LCx stent occl, severe LAD and Dx dz, diffuse RCA disease >> CABG   Carotid stenosis    a. Carotid US (02/27/14):  Bilateral ICA 1-39%   CKD (chronic kidney disease) stage 3    Depression    Diabetes mellitus    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HCC)    a. Mesenteric Artery Duplex (7/15):  pSMA chronic dissection with aneurysmal dilation of 1.29 cm (VVS);  b.  Chest CTA (02/26/14):  IMPRESSION:  1. No aortic  dissection or other acute abnormality.  2. 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  3. Stable 17 mm ectasia of the left common iliac artery.  4. Atherosclerosis, including aortoiliac and coronary artery disease.    HFmrEF (heart failure with mildly reduced EF) 09/2015   Echo 6/15: EF 50-55 // Echo 1/17: mod LVH, EF 55%, inf-lat HK, Gr 1 DD, mild LAE // Echocardiogram 10/22: EF 40-45, Gr 1 DD, mildly reduced RVSF, RVSP 26.6, trivial MR, trivial AI, post and ant-lat HK   History of kidney stones    Hyperlipidemia     Hypertension    Iliac artery aneurysm, left (HCC)    Myocardial infarction (HCC)    PVC's (premature ventricular contractions) 07/21/2021   Monitor 10/22:  NSR avg HR 71; no AF/Flutter; no high grade HB or pathologic pauses PVC burden 20%, rare supraventricular beats w/o sustained arrhythmia    Stroke (HCC) 09/26/2009    Past Surgical History:  Procedure Laterality Date   CARDIAC CATHETERIZATION     CARDIAC CATHETERIZATION N/A 06/25/2015   Procedure: Left Heart Cath and Coronary Angiography;  Surgeon: Kathleene Hazel, MD;  Location: [facility] INVASIVE CV LAB;  Service: Cardiovascular;  Laterality: N/A;   COLONOSCOPY  2016   DJ-MAC-moviprep(adeq)-TA-73yr recall   COLONOSCOPY WITH PROPOFOL N/A 07/23/2015   Procedure: COLONOSCOPY WITH PROPOFOL;  Surgeon: Rachael Fee, MD;  Location: [facility] ENDOSCOPY;  Service: Endoscopy;  Laterality: N/A;   CORONARY ANGIOPLASTY     CORONARY ARTERY BYPASS GRAFT N/A 09/21/2021   Procedure: CORONARY ARTERY BYPASS GRAFTING (CABG)  X FOUR, ON PUMP, USING LEFT INTERNAL MAMMARY ARTERY AND RIGHT ENDOSCOPIC GREATER SAPHENOUS VEIN CONDUITS;  Surgeon: Corliss Skains, MD;  Location: [facility] OR;  Service: Open Heart Surgery;  Laterality: N/A;   CYSTOSCOPY/URETEROSCOPY/HOLMIUM LASER/STENT PLACEMENT Right 03/28/2023   Procedure: CYSTOSCOPY, RIGHT RETROGRADE PYELOGRAM, DIAGNOSTIC RIGHT URETEROSCOPY;  Surgeon: Noel Christmas, MD;  Location: [facility] ORS;  Service: Urology;  Laterality: Right;   ENDOVEIN HARVEST OF GREATER SAPHENOUS VEIN  09/21/2021   Procedure: ENDOVEIN HARVEST OF GREATER SAPHENOUS VEIN;  Surgeon: Corliss Skains, MD;  Location: [facility] OR;  Service: Open Heart Surgery;;   EXTRACORPOREAL SHOCK WAVE LITHOTRIPSY Right 11/21/2022   Procedure: EXTRACORPOREAL SHOCK WAVE LITHOTRIPSY (ESWL);  Surgeon: Rene Paci, MD;  Location: [facility];  Service: Urology;  Laterality: Right;   LEFT HEART CATH AND CORONARY ANGIOGRAPHY N/A 08/31/2021    Procedure: LEFT HEART CATH AND CORONARY ANGIOGRAPHY;  Surgeon: Lyn Records, MD;  Location: [facility] INVASIVE CV LAB;  Service: Cardiovascular;  Laterality: N/A;   LEFT HEART CATHETERIZATION WITH CORONARY ANGIOGRAM N/A 02/26/2014   Procedure: LEFT HEART CATHETERIZATION WITH CORONARY ANGIOGRAM;  Surgeon: Iran Ouch, MD;  Location: [facility] CATH LAB;  Service: Cardiovascular;  Laterality: N/A;   LEFT HEART CATHETERIZATION WITH CORONARY ANGIOGRAM N/A 03/10/2014   Procedure: LEFT HEART CATHETERIZATION WITH CORONARY ANGIOGRAM;  Surgeon: Corky Crafts, MD;  Location: [facility] CATH LAB;  Service: Cardiovascular;  Laterality: N/A;   POLYPECTOMY  2016   TA   RIGHT HEART CATH AND CORONARY/GRAFT ANGIOGRAPHY N/A 07/01/2022   Procedure: RIGHT HEART CATH AND CORONARY/GRAFT ANGIOGRAPHY;  Surgeon: Tonny Bollman, MD;  Location: [facility] INVASIVE CV LAB;  Service: Cardiovascular;  Laterality: N/A;   TEE WITHOUT CARDIOVERSION N/A 09/21/2021   Procedure: TRANSESOPHAGEAL ECHOCARDIOGRAM (TEE);  Surgeon: Corliss Skains, MD;  Location: [facility] OR;  Service: Open Heart Surgery;  Laterality: N/A;    Prior to Admission medications   Medication Sig Start Date End Date Taking? Authorizing Provider  blood glucose meter kit and supplies Dispense based on patient and insurance preference. Use up to four times daily as directed. (FOR ICD-10 E10.9, E11.9). 05/10/19  Yes Copland, Gwenlyn Found, MD  gabapentin (NEURONTIN) 300 MG capsule Take 300 mg by mouth 2 (two) times daily.   Yes [provider]  glucose blood (ONETOUCH ULTRA) test strip USE  STRIP TO CHECK GLUCOSE TWICE DAILY 05/13/22  Yes Copland, Gwenlyn Found, MD  Insulin Pen Needle (TECHLITE PEN NEEDLES) 32G X 8 MM MISC Use daily to administer insulin 05/13/22  Yes Copland, Gwenlyn Found, MD  Lancets (ONETOUCH DELICA PLUS LANCET33G) MISC Check blood sugars twice daily 07/20/22  Yes Copland, Gwenlyn Found, MD  dapagliflozin propanediol (FARXIGA) 5 MG TABS tablet Take 1 tablet (5 mg total)  by mouth daily before breakfast. 08/01/22   Copland, Gwenlyn Found, MD  isosorbide mononitrate (IMDUR) 30 MG 24 hr tablet Take 1 tablet (30 mg total) by mouth daily. You can take an additional half tablet as needed for chest pain 10/07/22   Gaston Islam., NP  LANTUS 100 UNIT/ML injection Inject 0.22 mLs (22 Units total) into the skin daily. 06/02/23   Copland, Gwenlyn Found, MD  metoprolol tartrate (LOPRESSOR) 25 MG tablet Take 1 tablet (25 mg total) by mouth 2 (two) times daily. 10/12/21   Alver Sorrow, NP  morphine (MSIR) 15 MG tablet Take 0.5 tablets (7.5 mg total) by mouth every 4 (four) hours as needed for severe pain. 09/30/22   Melene Plan, DO  nitroGLYCERIN (NITROSTAT) 0.4 MG SL tablet Place 1 tablet (0.4 mg total) under the tongue every 5 (five) minutes as needed for chest pain. 07/28/22   Copland, Gwenlyn Found, MD  ofloxacin (FLOXIN OTIC) 0.3 % OTIC solution Place 10 drops into the right ear daily. Use for 7- 10 days as needed Patient taking differently: Place 10 drops into the  right ear daily as needed (Infection/Pain). 07/28/22   Copland, Gwenlyn Found, MD  ondansetron (ZOFRAN-ODT) 4 MG disintegrating tablet 4mg  ODT q4 hours prn nausea/vomit Patient not taking: Reported on 03/21/2023 09/30/22   Melene Plan, DO  oxyCODONE-acetaminophen (PERCOCET) 5-325 MG tablet Take 1 tablet by mouth every 4 (four) hours as needed for severe pain. 11/21/22   Rene Paci, MD  rosuvastatin (CRESTOR) 40 MG tablet Take 1 tablet (40 mg total) by mouth daily. 06/02/23   Tonny Bollman, MD    Current Outpatient Medications  Medication Sig Dispense Refill   blood glucose meter kit and supplies Dispense based on patient and insurance preference. Use up to four times daily as directed. (FOR ICD-10 E10.9, E11.9). 1 each 0   gabapentin (NEURONTIN) 300 MG capsule Take 300 mg by mouth 2 (two) times daily.     glucose blood (ONETOUCH ULTRA) test strip USE  STRIP TO CHECK GLUCOSE TWICE DAILY 200 each 12   Insulin Pen Needle  (TECHLITE PEN NEEDLES) 32G X 8 MM MISC Use daily to administer insulin 100 each 3   Lancets (ONETOUCH DELICA PLUS LANCET33G) MISC Check blood sugars twice daily 200 each 12   dapagliflozin propanediol (FARXIGA) 5 MG TABS tablet Take 1 tablet (5 mg total) by mouth daily before breakfast. 90 tablet 3   isosorbide mononitrate (IMDUR) 30 MG 24 hr tablet Take 1 tablet (30 mg total) by mouth daily. You can take an additional half tablet as needed for chest pain 45 tablet 1   LANTUS 100 UNIT/ML injection Inject 0.22 mLs (22 Units total) into the skin daily. 10 mL 0   metoprolol tartrate (LOPRESSOR) 25 MG tablet Take 1 tablet (25 mg total) by mouth 2 (two) times daily. 180 tablet 3   morphine (MSIR) 15 MG tablet Take 0.5 tablets (7.5 mg total) by mouth every 4 (four) hours as needed for severe pain. 7 tablet 0   nitroGLYCERIN (NITROSTAT) 0.4 MG SL tablet Place 1 tablet (0.4 mg total) under the tongue every 5 (five) minutes as needed for chest pain. 50 tablet 3   ofloxacin (FLOXIN OTIC) 0.3 % OTIC solution Place 10 drops into the right ear daily. Use for 7- 10 days as needed (Patient taking differently: Place 10 drops into the right ear daily as needed (Infection/Pain).) 5 mL 1   ondansetron (ZOFRAN-ODT) 4 MG disintegrating tablet 4mg  ODT q4 hours prn nausea/vomit (Patient not taking: Reported on 03/21/2023) 20 tablet 0   oxyCODONE-acetaminophen (PERCOCET) 5-325 MG tablet Take 1 tablet by mouth every 4 (four) hours as needed for severe pain. 20 tablet 0   rosuvastatin (CRESTOR) 40 MG tablet Take 1 tablet (40 mg total) by mouth daily. 30 tablet 5   Current Facility-Administered Medications  Medication Dose Route Frequency Provider Last Rate Last Admin   0.9 %  sodium chloride infusion  500 mL Intravenous Once Jenel Lucks, MD        Allergies as of 06/30/2023 - Review Complete 06/30/2023  Allergen Reaction Noted   Metformin and related  03/19/2018    Family History  Problem Relation Age of Onset    Diabetes Father    Hypertension Father    Heart attack Father    Stroke Neg Hx     Social History   Socioeconomic History   Marital status: Married    Spouse name: Not on file   Number of children: 3   Years of education: Not on file   Highest education level: Not on file  Occupational History   Occupation: Retired  Tobacco Use   Smoking status: Former    Current packs/day: 0.00    Average packs/day: 0.3 packs/day for 38.0 years (9.5 ttl pk-yrs)    Types: Cigarettes    Start date: 64    Quit date: 2008    Years since quitting: 16.7   Smokeless tobacco: Never  Vaping Use   Vaping status: Never Used  Substance and Sexual Activity   Alcohol use: No    Alcohol/week: 0.0 standard drinks of alcohol   Drug use: No   Sexual activity: Yes  Other Topics Concern   Not on file  Social History Narrative   Marital: married.      Lives: with son,wife.       Children: 3 children; 6 grandchildren      Employed: unemployed; disability unknown reason/CVA L sided weakness      Tobacco:  Quit 2012; smoked 40 years.       Alcohol: no drinking now; social in past.       Drugs: none       Exercise: sporadic.       ADLs:  No driving since CVA.   Social Determinants of Health   Financial Resource Strain: Low Risk  (05/28/2021)   Overall Financial Resource Strain (CARDIA)    Difficulty of Paying Living Expenses: Not very hard  Food Insecurity: Food Insecurity Present (11/12/2021)   Hunger Vital Sign    Worried About Running Out of Food in the Last Year: Sometimes true    Ran Out of Food in the Last Year: Never true  Transportation Needs: No Transportation Needs (06/07/2021)   PRAPARE - Administrator, Civil Service (Medical): No    Lack of Transportation (Non-Medical): No  Physical Activity: Inactive (11/12/2021)   Exercise Vital Sign    Days of Exercise per Week: 0 days    Minutes of Exercise per Session: 0 min  Stress: Not on file  Social Connections: Not on file   Intimate Partner Violence: Not At Risk (11/12/2021)   Humiliation, Afraid, Rape, and Kick questionnaire    Fear of Current or Ex-Partner: No    Emotionally Abused: No    Physically Abused: No    Sexually Abused: No    Review of Systems:  All other review of systems negative except as mentioned in the HPI.  Physical Exam: Vital signs BP (!) 128/91   Pulse (!) 54   Temp (!) 96.2 F (35.7 C)   Ht 5\' 5"  (1.651 m)   Wt 158 lb (71.7 kg)   SpO2 98%   BMI 26.29 kg/m   General:   Alert,  Well-developed, well-nourished, pleasant and cooperative in NAD Airway:  Mallampati 2 Lungs:  Clear throughout to auscultation.   Heart:  Regular rate and rhythm; no murmurs, clicks, rubs,  or gallops. Abdomen:  Soft, nontender and nondistended. Normal bowel sounds.   Neuro/Psych:  Normal mood and affect. A and O x 3   Kaleena Corrow E. [name], MD [facility]

## 2023-06-30 NOTE — Patient Instructions (Signed)
Resume previous diet Continue present medications Await pathology results No further colonoscopy is needed due to age over 64 when next one would be due, call if any GI concern in the future such as rectal bleeding or change in bowel habits Handouts/information given for polyps, diverticulosis and hemorrhoids  YOU HAD AN ENDOSCOPIC PROCEDURE TODAY AT THE [facility]:   Refer to the procedure report that was given to you for any specific questions about what was found during the examination.  If the procedure report does not answer your questions, please call your gastroenterologist to clarify.  If you requested that your care partner not be given the details of your procedure findings, then the procedure report has been included in a sealed envelope for you to review at your convenience later.  YOU SHOULD EXPECT: Some feelings of bloating in the abdomen. Passage of more gas than usual.  Walking can help get rid of the air that was put into your GI tract during the procedure and reduce the bloating. If you had a lower endoscopy (such as a colonoscopy or flexible sigmoidoscopy) you may notice spotting of blood in your stool or on the toilet paper. If you underwent a bowel prep for your procedure, you may not have a normal bowel movement for a few days.  Please Note:  You might notice some irritation and congestion in your nose or some drainage.  This is from the oxygen used during your procedure.  There is no need for concern and it should clear up in a day or [place].  SYMPTOMS TO REPORT IMMEDIATELY:  Following lower endoscopy (colonoscopy):  Excessive amounts of blood in the stool  Significant tenderness or worsening of abdominal pains  Swelling of the abdomen that is new, acute  Fever of 100F or higher  For urgent or emergent issues, a gastroenterologist can be reached at any hour by calling [phone]. Do not use MyChart messaging for urgent concerns.    DIET:  We do recommend  a small meal at first, but then you may proceed to your regular diet.  Drink plenty of fluids but you should avoid alcoholic beverages for 24 hours.  ACTIVITY:  You should plan to take it easy for the rest of today and you should NOT DRIVE or use heavy machinery until tomorrow (because of the sedation medicines used during the test).    FOLLOW UP: Our staff will call the number listed on your records the next business day following your procedure.  We will call around 7:15- 8:00 am to check on you and address any questions or concerns that you may have regarding the information given to you following your procedure. If we do not reach you, we will leave a message.     If any biopsies were taken you will be contacted by phone or by letter within the next 1-3 weeks.  Please call us at [phone] if you have not heard about the biopsies in 3 weeks.    SIGNATURES/CONFIDENTIALITY: You and/or your care partner have signed paperwork which will be entered into your electronic medical record.  These signatures attest to the fact that that the information above on your After Visit Summary has been reviewed and is understood.  Full responsibility of the confidentiality of this discharge information lies with you and/or your care-partner.

## 2023-06-30 NOTE — Progress Notes (Signed)
Son at beside to interpret.   Pt's states no medical or surgical changes since previsit or office visit.

## 2023-07-03 ENCOUNTER — Telehealth: Payer: Self-pay

## 2023-07-03 NOTE — Telephone Encounter (Signed)
  Follow up Call-     06/30/2023   11:04 AM  Call back number  Post procedure Call Back phone  # [phone] -son's phone  Permission to leave phone message Yes    Attempted to reach out to patient but was not able to leave a message.

## 2023-07-04 LAB — SURGICAL PATHOLOGY
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
## 2023-07-07 NOTE — Progress Notes (Signed)
Barry [name],   The small polyps that I removed during your recent procedure were completely benign but were proven to be "pre-cancerous" polyps that MAY have grown into cancers if they had not been removed.  Studies shows that at least 20% of women over age 73 and 30% of men over age 30 have pre-cancerous polyps.  Based on current nationally recognized surveillance guidelines, it would be recommended that you have a repeat colonoscopy in 5 years.   However, because colon cancer screening after age 14 is done on a case-by-case basis, taking into account the patient's risk factors for colon cancer, as well as comorbidities and life expectancy, I would recommend against further colon cancer screening.  If you would like to consider further colon cancer screening, please make an office visit with me in 5 years.   If you develop any new rectal bleeding, abdominal pain or significant bowel habit changes, please contact me before then.

## 2023-07-10 ENCOUNTER — Ambulatory Visit (HOSPITAL_BASED_OUTPATIENT_CLINIC_OR_DEPARTMENT_OTHER)
Admission: RE | Admit: 2023-07-10 | Discharge: 2023-07-10 | Disposition: A | Discharge status: Home / Self Care | Payer: Medicare Other | Source: Ambulatory Visit | Attending: Family Medicine | Admitting: Family Medicine

## 2023-07-10 ENCOUNTER — Ambulatory Visit (INDEPENDENT_AMBULATORY_CARE_PROVIDER_SITE_OTHER): Payer: Medicare Other | Admitting: Family Medicine

## 2023-07-10 ENCOUNTER — Encounter: Payer: Self-pay | Admitting: Family Medicine

## 2023-07-10 VITALS — BP 150/80 | HR 70 | Ht 65.0 in | Wt 158.0 lb

## 2023-07-10 DIAGNOSIS — R109 Unspecified abdominal pain: Secondary | ICD-10-CM | POA: Insufficient documentation

## 2023-07-10 DIAGNOSIS — I10 Essential (primary) hypertension: Secondary | ICD-10-CM | POA: Diagnosis not present

## 2023-07-10 DIAGNOSIS — E785 Hyperlipidemia, unspecified: Secondary | ICD-10-CM

## 2023-07-10 DIAGNOSIS — I499 Cardiac arrhythmia, unspecified: Secondary | ICD-10-CM

## 2023-07-10 DIAGNOSIS — E118 Type 2 diabetes mellitus with unspecified complications: Secondary | ICD-10-CM | POA: Diagnosis not present

## 2023-07-10 DIAGNOSIS — Z23 Encounter for immunization: Secondary | ICD-10-CM | POA: Diagnosis not present

## 2023-07-10 DIAGNOSIS — H9201 Otalgia, right ear: Secondary | ICD-10-CM | POA: Diagnosis not present

## 2023-07-10 DIAGNOSIS — K5900 Constipation, unspecified: Secondary | ICD-10-CM | POA: Diagnosis not present

## 2023-07-10 LAB — POCT URINALYSIS DIP (MANUAL ENTRY)
Bilirubin, UA: NEGATIVE
Blood, UA: NEGATIVE
Glucose, UA: 100 mg/dL — AB
Ketones, POC UA: NEGATIVE mg/dL
Leukocytes, UA: NEGATIVE
Nitrite, UA: NEGATIVE
Spec Grav, UA: 1.02 (ref 1.010–1.025)
Urobilinogen, UA: 0.2 U/dL
pH, UA: 6 (ref 5.0–8.0)

## 2023-07-10 NOTE — Patient Instructions (Signed)
Please go to lab and then to x-ray, I will be in touch with your results!   Please let me know if anything is changing or getting worse

## 2023-07-11 ENCOUNTER — Encounter: Payer: Self-pay | Admitting: Family Medicine

## 2023-07-11 LAB — COMPREHENSIVE METABOLIC PANEL
ALT: 17 U/L (ref 0–53)
AST: 21 U/L (ref 0–37)
Albumin: 4 g/dL (ref 3.5–5.2)
Alkaline Phosphatase: 89 U/L (ref 39–117)
BUN: 19 mg/dL (ref 6–23)
CO2: 31 meq/L (ref 19–32)
Calcium: 9.4 mg/dL (ref 8.4–10.5)
Chloride: 102 meq/L (ref 96–112)
Creatinine, Ser: 1.22 mg/dL (ref 0.40–1.50)
GFR: 58.84 mL/min — ABNORMAL LOW (ref >60.00)
Glucose, Bld: 154 mg/dL — ABNORMAL HIGH (ref 70–99)
Potassium: 3.7 meq/L (ref 3.5–5.1)
Sodium: 141 meq/L (ref 135–145)
Total Bilirubin: 0.4 mg/dL (ref 0.2–1.2)
Total Protein: 6.6 g/dL (ref 6.0–8.3)

## 2023-07-11 LAB — CBC
HCT: 49.8 % (ref 39.0–52.0)
Hemoglobin: 16 g/dL (ref 13.0–17.0)
MCHC: 32 g/dL (ref 30.0–36.0)
MCV: 88 fL (ref 78.0–100.0)
Platelets: 159 10*3/uL (ref 150.0–400.0)
RBC: 5.66 Mil/uL (ref 4.22–5.81)
RDW: 14.2 % (ref 11.5–15.5)
WBC: 7.7 10*3/uL (ref 4.0–10.5)

## 2023-07-11 LAB — URINE CULTURE
MICRO NUMBER:: 15590789
Result:: NO GROWTH
SPECIMEN QUALITY:: ADEQUATE

## 2023-07-11 LAB — HEMOGLOBIN A1C: Hgb A1c MFr Bld: 9.9 % — ABNORMAL HIGH (ref 4.6–6.5)

## 2023-07-11 LAB — MICROALBUMIN / CREATININE URINE RATIO
Creatinine,U: 136.6 mg/dL
Microalb Creat Ratio: 10.5 mg/g (ref 0.0–30.0)
Microalb, Ur: 14.3 mg/dL — ABNORMAL HIGH (ref 0.0–1.9)

## 2023-07-11 LAB — LIPASE: Lipase: 34 U/L (ref 11.0–59.0)

## 2023-07-12 ENCOUNTER — Encounter: Payer: Self-pay | Admitting: Family Medicine

## 2023-07-30 ENCOUNTER — Telehealth: Payer: Self-pay | Admitting: Family Medicine

## 2023-07-30 NOTE — Telephone Encounter (Signed)
Epic alerted me that patient has not read his MyChart message.  I called and spoke with his son [name].  Apparently another of his sons has access to his MyChart account, [name] will have his brother login to his dad's account and reviewed my message.  I advised I would like him to come in for a visit anyway in the next few weeks, please bring all medications because I was not clear on what he is taking.  They will plan to schedule an appointment via MyChart

## 2023-08-03 ENCOUNTER — Telehealth: Payer: Self-pay | Admitting: Family Medicine

## 2023-08-03 NOTE — Telephone Encounter (Signed)
[name], [name], called and requested to speak with Dr. Patsy Lager or Nurse because he has questions regarding the patient's recent CT scan.   [name] also wanted to inquire about the possibility of changing the patient's insulin shot to a pen instead, due to the patient having a colonoscopy and has been having a sharp pain in his stomach. They thought it was acid reflux Guillermo they treated it with OTC medication. However, the pain still remains. Please call and advise [name] at earliest convenience.

## 2023-08-08 NOTE — Telephone Encounter (Signed)
Pros states that Barry [name] has mentioned the injection site being irritated: L abdomen. He did suggest rotating sites but pt still has the pain. Pros asked is the Lantus might could be changed to an Alt tx?  Also, do you suggest a stool softer for the stool shown on the abd xray?

## 2023-08-08 NOTE — Telephone Encounter (Signed)
Called and spoke with son Pros.  Made him an appt for Thursday to go over medications and discuss persistent abdominal pain. Pros cannot make the appointment that we will have a virtual interpreter.  He will tell his dad about the appointment however.  I also asked him to please request that his father bring all of his medications with him Khaza I can clarify what he is taking.

## 2023-08-09 NOTE — Progress Notes (Addendum)
[facility] [address] [place], [place] [address] [phone]  Date:  08/10/2023   Name:  Barry [name]   DOB:  Feb 28, 1951   MRN:  [id number]  PCP:  Pearline Cables, MD    Chief Complaint: LLQ pain Lambert Mody pain come and go. )   History of Present Illness:  Barry [name] is a 72 y.o. very pleasant male patient who presents with the following:  Patient seen today for follow-up.  Most recent visit with myself was 1 month ago Seen today with aid of in person interpreter History of CAD status post NSTEMI and stenting x2, CABG x 4 vessels in 2022, chronic superior mesenteric artery dissection followed by vascular surgery, PAD, stroke in 2011, diabetes, hypertension, hyperlipidemia, CKD-his care is also complicated by language barrier and some medical noncompliance/ confusion about medication   At our last visit he had complained of a burning sensation in his abdomen to the left flank-exact duration difficult to discern, apparently a few months  Plain abdominal films showed constipation, otherwise normal Most recent A1c indicates inadequate control of diabetes, 9.9% We had a difficult time getting in touch for follow-up; his son called yesterday and I called him back, made this appt   Can update foot exam He is using lantus insulin with a syringe- his son asked me if we can change to a pen for easier use.  However pt states he does not want to make this change Today pt reports he is just using his insulin and nothing else.  Apparently one of his other medications made him feel dizzy Elchanan he stopped using ALL of them  He continues to note pain in his left abdomen - he thought this might be due to giving his insulin shot in her abdoemn but this did not get better when he moved injection site to arms/ legs- has not done injection in his belly in a month  We did abd x-ray last month that showed increased stool  We last got a CT abd/  pelvis in January of this year at which time he had some clot in his aorta as well as a large left proximal kidney stone.  Per ER notes the case was discussed with vascular surgery who did not recommend anticoagulation     Patient Active Problem List   Diagnosis Date Noted   Abnormal nuclear cardiac imaging test    S/P CABG x 4 09/21/2021   Abnormal nuclear stress test 08/31/2021   PVC's (premature ventricular contractions) 07/21/2021   CKD (chronic kidney disease) stage 3, GFR 30-59 ml/min (HCC) 07/21/2021   HFmrEF (heart failure with mildly reduced EF) 09/2015   Atherosclerotic peripheral vascular disease with ulceration (HCC) 04/03/2015   Aneurysm of iliac artery (HCC) 03/18/2014   Abdominal pain 03/18/2014   Syncope in setting of nausea and vomiting 03/07/14 03/08/2014   Coronary artery disease 02/28/2014   Non compliance with medical treatment 02/28/2014   Dyslipidemia, goal LDL below 70 02/28/2014   Bradycardia- beta blocker decreased 02/28/2014   Chronic diastolic CHF (congestive heart failure) (HCC) 02/28/2014   Type 2 diabetes mellitus with manifestations (HCC) 02/27/2014   TIA (transient ischemic attack) 02/26/2014   History of non-ST elevation myocardial infarction (NSTEMI) 02/26/2014   Weakness-Left arm / Lef 12/06/2013   Mesenteric artery stenosis (HCC) 12/06/2013   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HCC) 12/03/2012   Hypertension 11/10/2011   History of CVA in adulthood 11/10/2011  Language barrier 11/10/2011    Past Medical History:  Diagnosis Date   Anginal pain (HCC)    Arthritis    CAD (coronary artery disease)    LHC (02/26/14):  >> PCI:  Xience DES to pRCA // LHC (03/10/14):  >> PCI:  Xience DES to [facility] // Myoview 2/16: low risk // Cath 2016: patent stents; stable dz - med Rx // Myoview 10/22: anterolateral ischemia, inferoapical infarct w peri infarct ischemia; EF 41; High Risk // Cath 12/22: LCx stent occl, severe LAD and Dx dz, diffuse RCA disease >> CABG    Carotid stenosis    a. Carotid US (02/27/14):  Bilateral ICA 1-39%   CKD (chronic kidney disease) stage 3    Depression    Diabetes mellitus    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HCC)    a. Mesenteric Artery Duplex (7/15):  pSMA chronic dissection with aneurysmal dilation of 1.29 cm (VVS);  b.  Chest CTA (02/26/14):  IMPRESSION:  1. No aortic  dissection or other acute abnormality.  2. 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  3. Stable 17 mm ectasia of the left common iliac artery.  4. Atherosclerosis, including aortoiliac and coronary artery disease.    HFmrEF (heart failure with mildly reduced EF) 09/2015   Echo 6/15: EF 50-55 // Echo 1/17: mod LVH, EF 55%, inf-lat HK, Gr 1 DD, mild LAE // Echocardiogram 10/22: EF 40-45, Gr 1 DD, mildly reduced RVSF, RVSP 26.6, trivial MR, trivial AI, post and ant-lat HK   History of kidney stones    Hyperlipidemia    Hypertension    Iliac artery aneurysm, left (HCC)    Myocardial infarction (HCC)    PVC's (premature ventricular contractions) 07/21/2021   Monitor 10/22:  NSR avg HR 71; no AF/Flutter; no high grade HB or pathologic pauses PVC burden 20%, rare supraventricular beats w/o sustained arrhythmia    Stroke (HCC) 09/26/2009    Past Surgical History:  Procedure Laterality Date   CARDIAC CATHETERIZATION     CARDIAC CATHETERIZATION N/A 06/25/2015   Procedure: Left Heart Cath and Coronary Angiography;  Surgeon: Kathleene Hazel, MD;  Location: [facility] INVASIVE CV LAB;  Service: Cardiovascular;  Laterality: N/A;   COLONOSCOPY  2016   DJ-MAC-moviprep(adeq)-TA-55yr recall   COLONOSCOPY WITH PROPOFOL N/A 07/23/2015   Procedure: COLONOSCOPY WITH PROPOFOL;  Surgeon: Rachael Fee, MD;  Location: [facility] ENDOSCOPY;  Service: Endoscopy;  Laterality: N/A;   CORONARY ANGIOPLASTY     CORONARY ARTERY BYPASS GRAFT N/A 09/21/2021   Procedure: CORONARY ARTERY BYPASS GRAFTING (CABG)  X FOUR, ON PUMP, USING LEFT INTERNAL MAMMARY ARTERY AND RIGHT ENDOSCOPIC  GREATER SAPHENOUS VEIN CONDUITS;  Surgeon: Corliss Skains, MD;  Location: [facility] OR;  Service: Open Heart Surgery;  Laterality: N/A;   CYSTOSCOPY/URETEROSCOPY/HOLMIUM LASER/STENT PLACEMENT Right 03/28/2023   Procedure: CYSTOSCOPY, RIGHT RETROGRADE PYELOGRAM, DIAGNOSTIC RIGHT URETEROSCOPY;  Surgeon: Noel Christmas, MD;  Location: [facility] ORS;  Service: Urology;  Laterality: Right;   ENDOVEIN HARVEST OF GREATER SAPHENOUS VEIN  09/21/2021   Procedure: ENDOVEIN HARVEST OF GREATER SAPHENOUS VEIN;  Surgeon: Corliss Skains, MD;  Location: [facility] OR;  Service: Open Heart Surgery;;   EXTRACORPOREAL SHOCK WAVE LITHOTRIPSY Right 11/21/2022   Procedure: EXTRACORPOREAL SHOCK WAVE LITHOTRIPSY (ESWL);  Surgeon: Rene Paci, MD;  Location: [facility];  Service: Urology;  Laterality: Right;   LEFT HEART CATH AND CORONARY ANGIOGRAPHY N/A 08/31/2021   Procedure: LEFT HEART CATH AND CORONARY ANGIOGRAPHY;  Surgeon: Lyn Records, MD;  Location:  [facility] INVASIVE CV LAB;  Service: Cardiovascular;  Laterality: N/A;   LEFT HEART CATHETERIZATION WITH CORONARY ANGIOGRAM N/A 02/26/2014   Procedure: LEFT HEART CATHETERIZATION WITH CORONARY ANGIOGRAM;  Surgeon: Iran Ouch, MD;  Location: [facility] CATH LAB;  Service: Cardiovascular;  Laterality: N/A;   LEFT HEART CATHETERIZATION WITH CORONARY ANGIOGRAM N/A 03/10/2014   Procedure: LEFT HEART CATHETERIZATION WITH CORONARY ANGIOGRAM;  Surgeon: Corky Crafts, MD;  Location: [facility] CATH LAB;  Service: Cardiovascular;  Laterality: N/A;   POLYPECTOMY  2016   TA   RIGHT HEART CATH AND CORONARY/GRAFT ANGIOGRAPHY N/A 07/01/2022   Procedure: RIGHT HEART CATH AND CORONARY/GRAFT ANGIOGRAPHY;  Surgeon: Tonny Bollman, MD;  Location: [facility] INVASIVE CV LAB;  Service: Cardiovascular;  Laterality: N/A;   TEE WITHOUT CARDIOVERSION N/A 09/21/2021   Procedure: TRANSESOPHAGEAL ECHOCARDIOGRAM (TEE);  Surgeon: Corliss Skains, MD;  Location: [facility] OR;  Service: Open Heart  Surgery;  Laterality: N/A;    Social History   Tobacco Use   Smoking status: Former    Current packs/day: 0.00    Average packs/day: 0.3 packs/day for 38.0 years (9.5 ttl pk-yrs)    Types: Cigarettes    Start date: 10    Quit date: 2008    Years since quitting: 16.8   Smokeless tobacco: Never  Vaping Use   Vaping status: Never Used  Substance Use Topics   Alcohol use: No    Alcohol/week: 0.0 standard drinks of alcohol   Drug use: No    Family History  Problem Relation Age of Onset   Diabetes Father    Hypertension Father    Heart attack Father    Stroke Neg Hx     Allergies  Allergen Reactions   Metformin And Related     Pt feels that this causes constipation and will not take     Medication list has been reviewed and updated.  Current Outpatient Medications on File Prior to Visit  Medication Sig Dispense Refill   blood glucose meter kit and supplies Dispense based on patient and insurance preference. Use up to four times daily as directed. (FOR ICD-10 E10.9, E11.9). 1 each 0   dapagliflozin propanediol (FARXIGA) 5 MG TABS tablet Take 1 tablet (5 mg total) by mouth daily before breakfast. 90 tablet 3   gabapentin (NEURONTIN) 300 MG capsule Take 300 mg by mouth 2 (two) times daily.     glucose blood (ONETOUCH ULTRA) test strip USE  STRIP TO CHECK GLUCOSE TWICE DAILY 200 each 12   Insulin Pen Needle (TECHLITE PEN NEEDLES) 32G X 8 MM MISC Use daily to administer insulin 100 each 3   isosorbide mononitrate (IMDUR) 30 MG 24 hr tablet Take 1 tablet (30 mg total) by mouth daily. You can take an additional half tablet as needed for chest pain 45 tablet 1   Lancets (ONETOUCH DELICA PLUS LANCET33G) MISC Check blood sugars twice daily 200 each 12   LANTUS 100 UNIT/ML injection Inject 0.22 mLs (22 Units total) into the skin daily. 10 mL 0   metoprolol tartrate (LOPRESSOR) 25 MG tablet Take 1 tablet (25 mg total) by mouth 2 (two) times daily. 180 tablet 3   morphine (MSIR) 15 MG  tablet Take 0.5 tablets (7.5 mg total) by mouth every 4 (four) hours as needed for severe pain. 7 tablet 0   nitroGLYCERIN (NITROSTAT) 0.4 MG SL tablet Place 1 tablet (0.4 mg total) under the tongue every 5 (five) minutes as needed for chest pain. 50 tablet 3   ofloxacin (FLOXIN  OTIC) 0.3 % OTIC solution Place 10 drops into the right ear daily. Use for 7- 10 days as needed (Patient taking differently: Place 10 drops into the right ear daily as needed (Infection/Pain).) 5 mL 1   ondansetron (ZOFRAN-ODT) 4 MG disintegrating tablet 4mg  ODT q4 hours prn nausea/vomit 20 tablet 0   oxyCODONE-acetaminophen (PERCOCET) 5-325 MG tablet Take 1 tablet by mouth every 4 (four) hours as needed for severe pain. 20 tablet 0   rosuvastatin (CRESTOR) 40 MG tablet Take 1 tablet (40 mg total) by mouth daily. 30 tablet 5   No current facility-administered medications on file prior to visit.    Review of Systems:  As per HPI- otherwise negative.   Physical Examination: Vitals:   08/10/23 1348  BP: (!) 148/70  Pulse: 70  Resp: 18  Temp: 97.8 F (36.6 C)  SpO2: 98%   Vitals:   08/10/23 1348  Weight: 159 lb 12.8 oz (72.5 kg)  Height: 5\' 5"  (1.651 m)   Body mass index is 26.59 kg/m. Ideal Body Weight: Weight in (lb) to have BMI = 25: 149.9  GEN: no acute distress. Normal weight, looks well  HEENT: Atraumatic, Normocephalic.  Ears and Nose: No external deformity. CV: RRR, No M/G/R. No JVD. No thrill. No extra heart sounds. PULM: CTA B, no wheezes, crackles, rhonchi. No retractions. No resp. distress. No accessory muscle use. ABD: S, NT, ND, +BS. No rebound. No HSM.  Old surgical scars on abdomen He indicates a patch of skin to the left of the umbilicus as the site of concern but it is not tender to palpation  EXTR: No c/c/e PSYCH: Normally interactive. Conversant.  Strong pulses both feet    Assessment and Plan: Left lower quadrant abdominal pain - Plan: CT ABDOMEN PELVIS W CONTRAST  Screening  for prostate cancer - Plan: PSA  Type 2 diabetes mellitus with complication, without long-term current use of insulin (HCC)  Dyslipidemia, goal LDL below 70  Essential hypertension  History of kidney stones  S/P CABG x 4  Language barrier  Non compliance with medical treatment Pt seen today with concern of persistent pain in his left abdomen for an uncertain about of time, but it seems like 2-3 months at least.  Pt describes this as being skin pain as opposed to something deeper Will obtain a CT- If CT negative suspect this is nerve pain He has an rx for gabapentin but it not taking I asked him to at last get back on his [place] and statin and he will try Need to update PSA screening  I asked him to see me in 3-4 months for recheck of diabetes. Hopefully farxiga will help    Signed Abbe Amsterdam, MD  Addendum 11/15, received lab work and CT scan as below  [place] and spoke with [name] Likely chronic cholecystitis; we are uncertain if this will cause clinically significant disease at some point.  I would like to refer the patient to general surgery to discuss this, will place this referral  I also reached out to his urologist Dr. Arita Miss regarding hydroureteronephrosis  I will also mail the patient a copy of his CT scan and my recommendations for his records Results for orders placed or performed in visit on 08/10/23  PSA  Result Value Ref Range   PSA 0.89 0.10 - 4.00 ng/mL   Lab Results  Component Value Date   PSA 0.89 08/10/2023   PSA 0.44 07/28/2022   PSA 0.71 06/07/2021    CT  ABDOMEN PELVIS W CONTRAST  Result Date: 08/10/2023 CLINICAL DATA:  Left lower quadrant pain. EXAM: CT ABDOMEN AND PELVIS WITH CONTRAST TECHNIQUE: Multidetector CT imaging of the abdomen and pelvis was performed using the standard protocol following bolus administration of intravenous contrast. RADIATION DOSE REDUCTION: This exam was performed according to the departmental dose-optimization  program which includes automated exposure control, adjustment of the mA and/or kV according to patient size and/or use of iterative reconstruction technique. CONTRAST:  OMNIPAQUE IOHEXOL 300 MG/ML  SOLN COMPARISON:  02/10/2023 FINDINGS: Lower Chest: No acute findings. Hepatobiliary: No suspicious hepatic masses identified. Small gallstones again noted. Gallbladder is nondistended, and mild gallbladder wall thickening is unchanged and possibly due to chronic cholecystitis. No signs of acute cholecystitis or biliary ductal dilatation. Pancreas:  No mass or inflammatory changes. Spleen: Within normal limits in size and appearance. Adrenals/Urinary Tract: No suspicious masses identified. Mild right hydroureteronephrosis is seen to the level of the bladder, which is unchanged and may be due to vesicoureteral reflux as the urinary bladder remains distended. No evidence of ureteral calculi. Stomach/Bowel: No evidence of obstruction, inflammatory process or abnormal fluid collections. Normal appendix visualized. Vascular/Lymphatic: No pathologically enlarged lymph nodes. No acute vascular findings. Reproductive:  Stable mildly enlarged prostate. Other:  None. Musculoskeletal:  No suspicious bone lesions identified. IMPRESSION: No acute findings. Stable mild right hydroureteronephrosis to the level of the bladder, which may be due to vesicoureteral reflux as the urinary bladder remains distended. No ureteral calculi or other obstructing etiology apparent by CT. Cholelithiasis and stable mild gallbladder wall thickening. These findings are nonspecific and may be due to chronic cholecystitis. No radiographic signs of acute cholecystitis or biliary ductal dilatation. Electronically Signed   By: Danae Orleans M.D.   On: 08/10/2023 18:51

## 2023-08-10 ENCOUNTER — Ambulatory Visit (HOSPITAL_BASED_OUTPATIENT_CLINIC_OR_DEPARTMENT_OTHER)
Admission: RE | Admit: 2023-08-10 | Discharge: 2023-08-10 | Disposition: A | Discharge status: Home / Self Care | Payer: Medicare Other | Source: Ambulatory Visit | Attending: Family Medicine | Admitting: Family Medicine

## 2023-08-10 ENCOUNTER — Ambulatory Visit (INDEPENDENT_AMBULATORY_CARE_PROVIDER_SITE_OTHER): Payer: Medicare Other | Admitting: Family Medicine

## 2023-08-10 VITALS — BP 148/70 | HR 70 | Temp 97.8°F | Resp 18 | Ht 65.0 in | Wt 159.8 lb

## 2023-08-10 DIAGNOSIS — I10 Essential (primary) hypertension: Secondary | ICD-10-CM | POA: Diagnosis not present

## 2023-08-10 DIAGNOSIS — R1032 Left lower quadrant pain: Secondary | ICD-10-CM | POA: Diagnosis not present

## 2023-08-10 DIAGNOSIS — E785 Hyperlipidemia, unspecified: Secondary | ICD-10-CM

## 2023-08-10 DIAGNOSIS — K811 Chronic cholecystitis: Secondary | ICD-10-CM

## 2023-08-10 DIAGNOSIS — E118 Type 2 diabetes mellitus with unspecified complications: Secondary | ICD-10-CM

## 2023-08-10 DIAGNOSIS — Z794 Long term (current) use of insulin: Secondary | ICD-10-CM | POA: Diagnosis not present

## 2023-08-10 DIAGNOSIS — Z91199 Patient's noncompliance with other medical treatment and regimen due to unspecified reason: Secondary | ICD-10-CM

## 2023-08-10 DIAGNOSIS — K8020 Calculus of gallbladder without cholecystitis without obstruction: Secondary | ICD-10-CM | POA: Diagnosis not present

## 2023-08-10 DIAGNOSIS — Z87442 Personal history of urinary calculi: Secondary | ICD-10-CM

## 2023-08-10 DIAGNOSIS — Z125 Encounter for screening for malignant neoplasm of prostate: Secondary | ICD-10-CM | POA: Diagnosis not present

## 2023-08-10 DIAGNOSIS — K828 Other specified diseases of gallbladder: Secondary | ICD-10-CM | POA: Diagnosis not present

## 2023-08-10 DIAGNOSIS — Z951 Presence of aortocoronary bypass graft: Secondary | ICD-10-CM | POA: Diagnosis not present

## 2023-08-10 DIAGNOSIS — Z603 Acculturation difficulty: Secondary | ICD-10-CM

## 2023-08-10 DIAGNOSIS — Z758 Other problems related to medical facilities and other health care: Secondary | ICD-10-CM | POA: Diagnosis not present

## 2023-08-10 DIAGNOSIS — N1330 Unspecified hydronephrosis: Secondary | ICD-10-CM | POA: Diagnosis not present

## 2023-08-10 MED ORDER — IOHEXOL 300 MG/ML  SOLN
100.0000 mL | Freq: Once | INTRAMUSCULAR | Status: AC | PRN
  Administered 2023-08-10: 100 mL via INTRAVENOUS

## 2023-08-10 NOTE — Patient Instructions (Addendum)
It was good to see you again today, please stop by lab and then go to imaging on the ground floor to set up your CT scan.  I will be in touch with these results of soon as possible  Please start back on at least the [place] and rosuvastatin/Crestor-these medications are very important.  Please see me in 3 or 4 months to follow-up on your diabetes

## 2023-08-11 LAB — PSA: PSA: 0.89 ng/mL (ref 0.10–4.00)

## 2023-08-11 NOTE — Addendum Note (Signed)
Addended by: Abbe Amsterdam C on: 08/11/2023 01:41 PM   Modules accepted: Orders

## 2023-08-14 DIAGNOSIS — I2510 Atherosclerotic heart disease of native coronary artery without angina pectoris: Secondary | ICD-10-CM | POA: Diagnosis not present

## 2023-08-14 DIAGNOSIS — I129 Hypertensive chronic kidney disease with stage 1 through stage 4 chronic kidney disease, or unspecified chronic kidney disease: Secondary | ICD-10-CM | POA: Diagnosis not present

## 2023-08-14 DIAGNOSIS — N1832 Chronic kidney disease, stage 3b: Secondary | ICD-10-CM | POA: Diagnosis not present

## 2023-08-15 ENCOUNTER — Encounter: Payer: Self-pay | Admitting: Family Medicine

## 2023-08-21 ENCOUNTER — Telehealth: Payer: Self-pay | Admitting: Family Medicine

## 2023-08-21 ENCOUNTER — Other Ambulatory Visit: Payer: Self-pay | Admitting: Family Medicine

## 2023-08-21 DIAGNOSIS — E118 Type 2 diabetes mellitus with unspecified complications: Secondary | ICD-10-CM

## 2023-08-21 NOTE — Telephone Encounter (Signed)
[name], [name], called and stated that the patients pharmacy is needing a script sent in to refill his insulin. Pt is suing [address] [facility] on [address]. Please call amd advise.

## 2023-08-23 DIAGNOSIS — K8020 Calculus of gallbladder without cholecystitis without obstruction: Secondary | ICD-10-CM | POA: Diagnosis not present

## 2023-08-28 ENCOUNTER — Other Ambulatory Visit: Payer: Self-pay | Admitting: Family Medicine

## 2023-08-28 DIAGNOSIS — E118 Type 2 diabetes mellitus with unspecified complications: Secondary | ICD-10-CM

## 2023-08-31 NOTE — Telephone Encounter (Signed)
Rx was sent in on 08/28/23.

## 2023-11-02 ENCOUNTER — Other Ambulatory Visit: Payer: Self-pay | Admitting: Family Medicine

## 2023-11-02 DIAGNOSIS — E118 Type 2 diabetes mellitus with unspecified complications: Secondary | ICD-10-CM

## 2023-11-03 ENCOUNTER — Other Ambulatory Visit: Payer: Self-pay | Admitting: Family Medicine

## 2023-11-03 DIAGNOSIS — E118 Type 2 diabetes mellitus with unspecified complications: Secondary | ICD-10-CM

## 2023-11-03 MED ORDER — RELION PEN NEEDLES 31G X 6 MM MISC: 3 refills | Status: DC

## 2023-11-03 MED ORDER — LANTUS 100 UNIT/ML ~~LOC~~ SOLN: 22.0000 [IU] | Freq: Every day | SUBCUTANEOUS | 1 refills | Status: DC

## 2023-11-03 NOTE — Telephone Encounter (Signed)
 Copied from CRM [id number]. Topic: Clinical - Medication Refill >> Nov 03, 2023  9:23 AM Isabell A wrote: Most Recent Primary Care Visit:  Provider: WATT HARLENE BROCKS  Department: [facility]  Visit Type: OFFICE VISIT  Date: 08/10/2023  Medication: LANTUS  100 UNIT/ML injection   Has the patient contacted their pharmacy? Yes (Agent: If no, request that the patient contact the pharmacy for the refill. If patient does not wish to contact the pharmacy document the reason why and proceed with request.) (Agent: If yes, when and what did the pharmacy advise?)  Is this the correct pharmacy for this prescription? Yes If no, delete pharmacy and type the correct one.  This is the patient's preferred pharmacy:  [facility] [address] ([address]), [place] - [address] [place] ([place]) [place] [address] Phone: [phone] Fax: [phone]   Has the prescription been filled recently? Yes  Is the patient out of the medication? No  Has the patient been seen for an appointment in the last year OR does the patient have an upcoming appointment? Yes  Can we respond through MyChart? No  Agent: Please be advised that Rx refills may take up to 3 business days. We ask that you follow-up with your pharmacy.

## 2023-11-03 NOTE — Telephone Encounter (Signed)
 Copied from CRM [id number]. Topic: Clinical - Medication Refill >> Nov 03, 2023  9:28 AM Isabell A wrote: Most Recent Primary Care Visit:  Provider: WATT HARLENE BROCKS  Department: [facility]  Visit Type: OFFICE VISIT  Date: 08/10/2023  Medication: Relion test strips [id number]  Has the patient contacted their pharmacy? Yes (Agent: If no, request that the patient contact the pharmacy for the refill. If patient does not wish to contact the pharmacy document the reason why and proceed with request.) (Agent: If yes, when and what did the pharmacy advise?)  Is this the correct pharmacy for this prescription? Yes If no, delete pharmacy and type the correct one.  This is the patient's preferred pharmacy:  [facility] [address] ([address]),  - [address] [place]) [place] [address] Phone: [phone] Fax: [phone]   Has the prescription been filled recently? Yes  Is the patient out of the medication? No  Has the patient been seen for an appointment in the last year OR does the patient have an upcoming appointment? Yes  Can we respond through MyChart? No  Agent: Please be advised that Rx refills may take up to 3 business days. We ask that you follow-up with your pharmacy.

## 2023-11-05 NOTE — Progress Notes (Deleted)
 [facility] [address] [place], [place] [address] [phone]  Date:  11/08/2023   Name:  Barry [name]   DOB:  Apr 15, 1951   MRN:  [id number]  PCP:  Pearline Cables, MD    Chief Complaint: No chief complaint on file.   History of Present Illness:  Barry [name] is a 73 y.o. very pleasant male patient who presents with the following:  Pt seen today with concern of ear problems History of CAD status post NSTEMI and stenting x2, CABG x 4 vessels in 2022, chronic superior mesenteric artery dissection followed by vascular surgery, PAD, stroke in 2011, diabetes, hypertension, hyperlipidemia, CKD-his care is also complicated by language barrier and some medical noncompliance/ confusion about medication    Patient Active Problem List   Diagnosis Date Noted   Abnormal nuclear cardiac imaging test    S/P CABG x 4 09/21/2021   Abnormal nuclear stress test 08/31/2021   PVC's (premature ventricular contractions) 07/21/2021   CKD (chronic kidney disease) stage 3, GFR 30-59 ml/min (HCC) 07/21/2021   HFmrEF (heart failure with mildly reduced EF) 09/2015   Atherosclerotic peripheral vascular disease with ulceration (HCC) 04/03/2015   Aneurysm of iliac artery (HCC) 03/18/2014   Abdominal pain 03/18/2014   Syncope in setting of nausea and vomiting 03/07/14 03/08/2014   Coronary artery disease 02/28/2014   Non compliance with medical treatment 02/28/2014   Dyslipidemia, goal LDL below 70 02/28/2014   Bradycardia- beta blocker decreased 02/28/2014   Chronic diastolic CHF (congestive heart failure) (HCC) 02/28/2014   Type 2 diabetes mellitus with manifestations (HCC) 02/27/2014   TIA (transient ischemic attack) 02/26/2014   History of non-ST elevation myocardial infarction (NSTEMI) 02/26/2014   Weakness-Left arm / Lef 12/06/2013   Mesenteric artery stenosis (HCC) 12/06/2013   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HCC) 12/03/2012    Hypertension 11/10/2011   History of CVA in adulthood 11/10/2011   Language barrier 11/10/2011    Past Medical History:  Diagnosis Date   Anginal pain (HCC)    Arthritis    CAD (coronary artery disease)    LHC (02/26/14):  >> PCI:  Xience DES to pRCA // LHC (03/10/14):  >> PCI:  Xience DES to [facility] // Myoview 2/16: low risk // Cath 2016: patent stents; stable dz - med Rx // Myoview 10/22: anterolateral ischemia, inferoapical infarct w peri infarct ischemia; EF 41; High Risk // Cath 12/22: LCx stent occl, severe LAD and Dx dz, diffuse RCA disease >> CABG   Carotid stenosis    a. Carotid US (02/27/14):  Bilateral ICA 1-39%   CKD (chronic kidney disease) stage 3    Depression    Diabetes mellitus    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HCC)    a. Mesenteric Artery Duplex (7/15):  pSMA chronic dissection with aneurysmal dilation of 1.29 cm (VVS);  b.  Chest CTA (02/26/14):  IMPRESSION:  1. No aortic  dissection or other acute abnormality.  2. 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  3. Stable 17 mm ectasia of the left common iliac artery.  4. Atherosclerosis, including aortoiliac and coronary artery disease.    HFmrEF (heart failure with mildly reduced EF) 09/2015   Echo 6/15: EF 50-55 // Echo 1/17: mod LVH, EF 55%, inf-lat HK, Gr 1 DD, mild LAE // Echocardiogram 10/22: EF 40-45, Gr 1 DD, mildly reduced RVSF, RVSP 26.6, trivial MR, trivial AI, post and ant-lat HK   History of kidney stones  Hyperlipidemia    Hypertension    Iliac artery aneurysm, left (HCC)    Myocardial infarction (HCC)    PVC's (premature ventricular contractions) 07/21/2021   Monitor 10/22:  NSR avg HR 71; no AF/Flutter; no high grade HB or pathologic pauses PVC burden 20%, rare supraventricular beats w/o sustained arrhythmia    Stroke (HCC) 09/26/2009    Past Surgical History:  Procedure Laterality Date   CARDIAC CATHETERIZATION     CARDIAC CATHETERIZATION N/A 06/25/2015   Procedure: Left Heart Cath and Coronary  Angiography;  Surgeon: Kathleene Hazel, MD;  Location: [facility] INVASIVE CV LAB;  Service: Cardiovascular;  Laterality: N/A;   COLONOSCOPY  2016   DJ-MAC-moviprep(adeq)-TA-11yr recall   COLONOSCOPY WITH PROPOFOL N/A 07/23/2015   Procedure: COLONOSCOPY WITH PROPOFOL;  Surgeon: Rachael Fee, MD;  Location: [facility] ENDOSCOPY;  Service: Endoscopy;  Laterality: N/A;   CORONARY ANGIOPLASTY     CORONARY ARTERY BYPASS GRAFT N/A 09/21/2021   Procedure: CORONARY ARTERY BYPASS GRAFTING (CABG)  X FOUR, ON PUMP, USING LEFT INTERNAL MAMMARY ARTERY AND RIGHT ENDOSCOPIC GREATER SAPHENOUS VEIN CONDUITS;  Surgeon: Corliss Skains, MD;  Location: [facility] OR;  Service: Open Heart Surgery;  Laterality: N/A;   CYSTOSCOPY/URETEROSCOPY/HOLMIUM LASER/STENT PLACEMENT Right 03/28/2023   Procedure: CYSTOSCOPY, RIGHT RETROGRADE PYELOGRAM, DIAGNOSTIC RIGHT URETEROSCOPY;  Surgeon: Noel Christmas, MD;  Location: [facility] ORS;  Service: Urology;  Laterality: Right;   ENDOVEIN HARVEST OF GREATER SAPHENOUS VEIN  09/21/2021   Procedure: ENDOVEIN HARVEST OF GREATER SAPHENOUS VEIN;  Surgeon: Corliss Skains, MD;  Location: [facility] OR;  Service: Open Heart Surgery;;   EXTRACORPOREAL SHOCK WAVE LITHOTRIPSY Right 11/21/2022   Procedure: EXTRACORPOREAL SHOCK WAVE LITHOTRIPSY (ESWL);  Surgeon: Rene Paci, MD;  Location[facility];  Service: Urology;  Laterality: Right;   LEFT HEART CATH AND CORONARY ANGIOGRAPHY N/A 08/31/2021   Procedure: LEFT HEART CATH AND CORONARY ANGIOGRAPHY;  Surgeon: Lyn Records, MD;  [facility] INVASIVE CV LAB;  Service: Cardiovascular;  Laterality: N/A;   LEFT HEART CATHETERIZATION WITH CORONARY ANGIOGRAM N/A 02/26/2014   Procedure: LEFT HEART CATHETERIZATION WITH CORONARY ANGIOGRAM;  Surgeon: Iran Ouch, MD;  [facility]: [facility] CATH LAB;  Service: Cardiovascular;  Laterality: N/A;   LEFT HEART CATHETERIZATION WITH CORONARY ANGIOGRAM N/A 03/10/2014   Procedure: LEFT HEART  CATHETERIZATION WITH CORONARY ANGIOGRAM;  Surgeon: Corky Crafts, MD;  Location: [facility] CATH LAB;  Service: Cardiovascular;  Laterality: N/A;   POLYPECTOMY  2016   TA   RIGHT HEART CATH AND CORONARY/GRAFT ANGIOGRAPHY N/A 07/01/2022   Procedure: RIGHT HEART CATH AND CORONARY/GRAFT ANGIOGRAPHY;  Surgeon: Tonny Bollman, MD;  Location: [facility] INVASIVE CV LAB;  Service: Cardiovascular;  Laterality: N/A;   TEE WITHOUT CARDIOVERSION N/A 09/21/2021   Procedure: TRANSESOPHAGEAL ECHOCARDIOGRAM (TEE);  Surgeon: Corliss Skains, MD;  Location: [facility] OR;  Service: Open Heart Surgery;  Laterality: N/A;    Social History   Tobacco Use   Smoking status: Former    Current packs/day: 0.00    Average packs/day: 0.3 packs/day for 38.0 years (9.5 ttl pk-yrs)    Types: Cigarettes    Start date: 71    Quit date: 2008    Years since quitting: 17.1   Smokeless tobacco: Never  Vaping Use   Vaping status: Never Used  Substance Use Topics   Alcohol use: No    Alcohol/week: 0.0 standard drinks of alcohol   Drug use: No    Family History  Problem Relation Age of Onset   Diabetes Father  Hypertension Father    Heart attack Father    Stroke Neg Hx     Allergies  Allergen Reactions   Metformin And Related     Pt feels that this causes constipation and will not take     Medication list has been reviewed and updated.  Current Outpatient Medications on File Prior to Visit  Medication Sig Dispense Refill   blood glucose meter kit and supplies Dispense based on patient and insurance preference. Use up to four times daily as directed. (FOR ICD-10 E10.9, E11.9). 1 each 0   dapagliflozin propanediol (FARXIGA) 5 MG TABS tablet Take 1 tablet (5 mg total) by mouth daily before breakfast. 90 tablet 3   gabapentin (NEURONTIN) 300 MG capsule Take 300 mg by mouth 2 (two) times daily.     glucose blood (ONETOUCH ULTRA) test strip USE 1 STRIP TO CHECK GLUCOSE TWICE DAILY 200 each 0   Insulin Pen Needle (RELION  PEN NEEDLES) 31G X 6 MM MISC To use w/ Lantus 100 each 3   isosorbide mononitrate (IMDUR) 30 MG 24 hr tablet Take 1 tablet (30 mg total) by mouth daily. You can take an additional half tablet as needed for chest pain 45 tablet 1   Lancets (ONETOUCH DELICA PLUS LANCET33G) MISC Check blood sugars twice daily 200 each 12   LANTUS 100 UNIT/ML injection Inject 0.22 mLs (22 Units total) into the skin daily. 10 mL 1   metoprolol tartrate (LOPRESSOR) 25 MG tablet Take 1 tablet (25 mg total) by mouth 2 (two) times daily. 180 tablet 3   morphine (MSIR) 15 MG tablet Take 0.5 tablets (7.5 mg total) by mouth every 4 (four) hours as needed for severe pain. 7 tablet 0   nitroGLYCERIN (NITROSTAT) 0.4 MG SL tablet Place 1 tablet (0.4 mg total) under the tongue every 5 (five) minutes as needed for chest pain. 50 tablet 3   ofloxacin (FLOXIN OTIC) 0.3 % OTIC solution Place 10 drops into the right ear daily. Use for 7- 10 days as needed (Patient taking differently: Place 10 drops into the right ear daily as needed (Infection/Pain).) 5 mL 1   ondansetron (ZOFRAN-ODT) 4 MG disintegrating tablet 4mg  ODT q4 hours prn nausea/vomit 20 tablet 0   oxyCODONE-acetaminophen (PERCOCET) 5-325 MG tablet Take 1 tablet by mouth every 4 (four) hours as needed for severe pain. 20 tablet 0   rosuvastatin (CRESTOR) 40 MG tablet Take 1 tablet (40 mg total) by mouth daily. 30 tablet 5   No current facility-administered medications on file prior to visit.    Review of Systems:  As per HPI- otherwise negative.   Physical Examination: There were no vitals filed for this visit. There were no vitals filed for this visit. There is no height or weight on file to calculate BMI. Ideal Body Weight:    GEN: no acute distress. HEENT: Atraumatic, Normocephalic.  Ears and Nose: No external deformity. CV: RRR, No M/G/R. No JVD. No thrill. No extra heart sounds. PULM: CTA B, no wheezes, crackles, rhonchi. No retractions. No resp. distress. No  accessory muscle use. ABD: S, NT, ND, +BS. No rebound. No HSM. EXTR: No c/c/e PSYCH: Normally interactive. Conversant.    Assessment and Plan: ***  Signed Abbe Amsterdam, MD

## 2023-11-08 ENCOUNTER — Ambulatory Visit: Payer: Medicare Other | Admitting: Family Medicine

## 2023-12-01 ENCOUNTER — Other Ambulatory Visit: Payer: Self-pay | Admitting: Family Medicine

## 2023-12-01 DIAGNOSIS — E118 Type 2 diabetes mellitus with unspecified complications: Secondary | ICD-10-CM

## 2023-12-11 ENCOUNTER — Other Ambulatory Visit: Payer: Self-pay | Admitting: Family Medicine

## 2023-12-11 DIAGNOSIS — E118 Type 2 diabetes mellitus with unspecified complications: Secondary | ICD-10-CM

## 2023-12-11 NOTE — Telephone Encounter (Signed)
 Copied from CRM [id number]. Topic: Clinical - Medication Refill >> Dec 11, 2023 12:53 PM Gurney Maxin H wrote: Most Recent Primary Care Visit:  Provider: Pearline Cables  Department: [facility]  Visit Type: OFFICE VISIT  Date: 08/10/2023  Medication: LANTUS 100 UNIT/ML injection  Has the patient contacted their pharmacy? Yes, contact provider (Agent: If no, request that the patient contact the pharmacy for the refill. If patient does not wish to contact the pharmacy document the reason why and proceed with request.) (Agent: If yes, when and what did the pharmacy advise?)  Is this the correct pharmacy for this prescription? Yes If no, delete pharmacy and type the correct one.  This is the patient's preferred pharmacy:  [facility] [address] ([address]  - [address] [place] ([place]) [place] [address] Phone: [phone] Fax: [phone]   Has the prescription been filled recently? No  Is the patient out of the medication? Yes  Has the patient been seen for an appointment in the last year OR does the patient have an upcoming appointment? Yes  Can we respond through MyChart? Yes  Agent: Please be advised that Rx refills may take up to 3 business days. We ask that you follow-up with your pharmacy.

## 2023-12-13 MED ORDER — LANTUS 100 UNIT/ML ~~LOC~~ SOLN: 22.0000 [IU] | Freq: Every day | SUBCUTANEOUS | 1 refills | Status: DC

## 2024-01-03 ENCOUNTER — Other Ambulatory Visit: Payer: Self-pay

## 2024-01-03 ENCOUNTER — Other Ambulatory Visit: Payer: Self-pay | Admitting: Family Medicine

## 2024-01-03 DIAGNOSIS — E118 Type 2 diabetes mellitus with unspecified complications: Secondary | ICD-10-CM

## 2024-01-03 MED ORDER — ONETOUCH ULTRA TEST VI STRP: ORAL_STRIP | 0 refills | Status: DC

## 2024-01-03 NOTE — Telephone Encounter (Signed)
 Copied from CRM [id number]. Topic: Clinical - Medication Refill >> Jan 03, 2024  2:10 PM Truddie Crumble wrote: Most Recent Primary Care Visit:  Provider: Pearline Cables  Department: [facility]  Visit Type: OFFICE VISIT  Date: 08/10/2023  Medication: ONETOUCH ULTRA TEST test strip  Has the patient contacted their pharmacy?  (Agent: If no, request that the patient contact the pharmacy for the refill. If patient does not wish to contact the pharmacy document the reason why and proceed with request.) (Agent: If yes, when and what did the pharmacy advise?)  Is this the correct pharmacy for this prescription? Yes If no, delete pharmacy and type the correct one.  This is the patient's preferred pharmacy:  [facility] [address] ([address]), [place] - [address] [place] ([place]) [place] [address] Phone: [phone] Fax: [phone]   Has the prescription been filled recently? No  Is the patient out of the medication? Yes  Has the patient been seen for an appointment in the last year OR does the patient have an upcoming appointment?   Can we respond through MyChart? Yes  Agent: Please be advised that Rx refills may take up to 3 business days. We ask that you follow-up with your pharmacy.

## 2024-01-31 ENCOUNTER — Ambulatory Visit: Admitting: Family Medicine

## 2024-02-03 NOTE — Progress Notes (Unsigned)
 [facility] at [facility] [address] [place], [place] [address] [phone]  Date:  02/07/2024   Name:  Barry [name]   DOB:  [birth year]   MRN:  [id number]  PCP:  Kaylee Partridge, MD    Chief Complaint: No chief complaint on file.   History of Present Illness:  Barry [name] is a 73 y.o. very pleasant male patient who presents with the following:  Patient seen today with concern of fatigue Most recent visit with myself was in November Seen today with aid of in person interpreter History of CAD status post NSTEMI and stenting x2, CABG x 4 vessels in 2022, chronic superior mesenteric artery dissection followed by vascular surgery, PAD, stroke in 2011, diabetes, hypertension, hyperlipidemia, CKD-his care is also complicated by language barrier and some medical noncompliance/ confusion about medication   At our last visit he has stopped all his medications except for insulin  due to concern of feeling dizzy He also noted pain in his left abdomen for a few months, I did a CT scan which showed cholelithiasis He had a consult with surgery who did not recommend cholecystectomy at this time  Lab Results  Component Value Date   HGBA1C 9.9 (H) 07/10/2023     Patient Active Problem List   Diagnosis Date Noted   Abnormal nuclear cardiac imaging test    S/P CABG x 4 09/21/2021   Abnormal nuclear stress test 08/31/2021   PVC's (premature ventricular contractions) 07/21/2021   CKD (chronic kidney disease) stage 3, GFR 30-59 ml/min (HCC) 07/21/2021   HFmrEF (heart failure with mildly reduced EF) 09/2015   Atherosclerotic peripheral vascular disease with ulceration (HCC) 04/03/2015   Aneurysm of iliac artery (HCC) 03/18/2014   Abdominal pain 03/18/2014   Syncope in setting of nausea and vomiting 03/07/14 03/08/2014   Coronary artery disease 02/28/2014   Non compliance with medical treatment 02/28/2014   Dyslipidemia, goal LDL below 70 02/28/2014    Bradycardia- beta blocker decreased 02/28/2014   Chronic diastolic CHF (congestive heart failure) (HCC) 02/28/2014   Type 2 diabetes mellitus with manifestations (HCC) 02/27/2014   TIA (transient ischemic attack) 02/26/2014   History of non-ST elevation myocardial infarction (NSTEMI) 02/26/2014   Weakness-Left arm / Lef 12/06/2013   Mesenteric artery stenosis (HCC) 12/06/2013   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HCC) 12/03/2012   Hypertension 11/10/2011   History of CVA in adulthood 11/10/2011   Language barrier 11/10/2011    Past Medical History:  Diagnosis Date   Anginal pain (HCC)    Arthritis    CAD (coronary artery disease)    LHC (02/26/14):  >> PCI:  Xience DES to pRCA // LHC (03/10/14):  >> PCI:  Xience DES to mCFX // Myoview  2/16: low risk // Cath 2016: patent stents; stable dz - med Rx // Myoview  10/22: anterolateral ischemia, inferoapical infarct w peri infarct ischemia; EF 41; High Risk // Cath 12/22: LCx stent occl, severe LAD and Dx dz, diffuse RCA disease >> CABG   Carotid stenosis    a. Carotid US  (02/27/14):  Bilateral ICA 1-39%   CKD (chronic kidney disease) stage 3    Depression    Diabetes mellitus    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HCC)    a. Mesenteric Artery Duplex (7/15):  pSMA chronic dissection with aneurysmal dilation of 1.29 cm (VVS);  b.  Chest CTA (02/26/14):  IMPRESSION:  1. No aortic  dissection or other acute abnormality.  2. Stable dissection  in the superior mesenteric artery.  3. Stable 17 mm ectasia of the left common iliac artery.  4. Atherosclerosis, including aortoiliac and coronary artery disease.    HFmrEF (heart failure with mildly reduced EF) 09/2015   Echo 6/15: EF 50-55 // Echo 1/17: mod LVH, EF 55%, inf-lat HK, Gr 1 DD, mild LAE // Echocardiogram 10/22: EF 40-45, Gr 1 DD, mildly reduced RVSF, RVSP 26.6, trivial MR, trivial AI, post and ant-lat HK   History of kidney stones    Hyperlipidemia    Hypertension    Iliac artery aneurysm, left (HCC)     Myocardial infarction (HCC)    PVC's (premature ventricular contractions) 07/21/2021   Monitor 10/22:  NSR avg HR 71; no AF/Flutter; no high grade HB or pathologic pauses PVC burden 20%, rare supraventricular beats w/o sustained arrhythmia    Stroke (HCC) 09/26/2009    Past Surgical History:  Procedure Laterality Date   CARDIAC CATHETERIZATION     CARDIAC CATHETERIZATION N/A 06/25/2015   Procedure: Left Heart Cath and Coronary Angiography;  Surgeon: Odie Benne, MD;  Location: [facility] INVASIVE CV LAB;  Service: Cardiovascular;  Laterality: N/A;   COLONOSCOPY  2016   DJ-MAC-moviprep (adeq)-TA-22yr recall   COLONOSCOPY WITH PROPOFOL  N/A 07/23/2015   Procedure: COLONOSCOPY WITH PROPOFOL ;  Surgeon: Janel Medford, MD;  [facility];  Service: Endoscopy;  Laterality: N/A;   CORONARY ANGIOPLASTY     CORONARY ARTERY BYPASS GRAFT N/A 09/21/2021   Procedure: CORONARY ARTERY BYPASS GRAFTING (CABG)  X FOUR, ON PUMP, USING LEFT INTERNAL MAMMARY ARTERY AND RIGHT ENDOSCOPIC GREATER SAPHENOUS VEIN CONDUITS;  Surgeon: Hilarie Lovely, MD;  Location[facility] OR;  Service: Open Heart Surgery;  Laterality: N/A;   CYSTOSCOPY/URETEROSCOPY/HOLMIUM LASER/STENT PLACEMENT Right 03/28/2023   Procedure: CYSTOSCOPY, RIGHT RETROGRADE PYELOGRAM, DIAGNOSTIC RIGHT URETEROSCOPY;  Surgeon: Roxane Copp, MD;  Location[facility]S;  Service: Urology;  Laterality: Right;   ENDOVEIN HARVEST OF GREATER SAPHENOUS VEIN  09/21/2021   Procedure: ENDOVEIN HARVEST OF GREATER SAPHENOUS VEIN;  Surgeon: Hilarie Lovely, MD;  [facility];  Service: Open Heart Surgery;;   EXTRACORPOREAL SHOCK WAVE LITHOTRIPSY Right 11/21/2022   Procedure: EXTRACORPOREAL SHOCK WAVE LITHOTRIPSY (ESWL);  Surgeon: Adelbert Homans, MD;  Location: [facility];  Service: Urology;  Laterality: Right;   LEFT HEART CATH AND CORONARY ANGIOGRAPHY N/A 08/31/2021   Procedure: LEFT HEART CATH AND CORONARY ANGIOGRAPHY;   Surgeon: Arty Binning, MD;  [facility] INVASIVE CV LAB;  Service: Cardiovascular;  Laterality: N/A;   LEFT HEART CATHETERIZATION WITH CORONARY ANGIOGRAM N/A 02/26/2014   Procedure: LEFT HEART CATHETERIZATION WITH CORONARY ANGIOGRAM;  Surgeon: Wenona Hamilton, MD;  [facility] CATH LAB;  Service: Cardiovascular;  Laterality: N/A;   LEFT HEART CATHETERIZATION WITH CORONARY ANGIOGRAM N/A 03/10/2014   Procedure: LEFT HEART CATHETERIZATION WITH CORONARY ANGIOGRAM;  Surgeon: Lucendia Rusk, MD;  Location: [facility] CATH LAB;  Service: Cardiovascular;  Laterality: N/A;   POLYPECTOMY  2016   TA   RIGHT HEART CATH AND CORONARY/GRAFT ANGIOGRAPHY N/A 07/01/2022   Procedure: RIGHT HEART CATH AND CORONARY/GRAFT ANGIOGRAPHY;  Surgeon: Arnoldo Lapping, MD;  Location: [facility] INVASIVE CV LAB;  Service: Cardiovascular;  Laterality: N/A;   TEE WITHOUT CARDIOVERSION N/A 09/21/2021   Procedure: TRANSESOPHAGEAL ECHOCARDIOGRAM (TEE);  Surgeon: Hilarie Lovely, MD;  Location: [facility] OR;  Service: Open Heart Surgery;  Laterality: N/A;    Social History   Tobacco Use   Smoking status: Former    Current packs/day: 0.00  Average packs/day: 0.3 packs/day for 38.0 years (9.5 ttl pk-yrs)    Types: Cigarettes    Start date: 15    Quit date: 2008    Years since quitting: 17.3   Smokeless tobacco: Never  Vaping Use   Vaping status: Never Used  Substance Use Topics   Alcohol use: No    Alcohol/week: 0.0 standard drinks of alcohol   Drug use: No    Family History  Problem Relation Age of Onset   Diabetes Father    Hypertension Father    Heart attack Father    Stroke Neg Hx     Allergies  Allergen Reactions   Metformin  And Related     Pt feels that this causes constipation and will not take     Medication list has been reviewed and updated.  Current Outpatient Medications on File Prior to Visit  Medication Sig Dispense Refill   blood glucose meter kit and supplies Dispense based on patient and  insurance preference. Use up to four times daily as directed. (FOR ICD-10 E10.9, E11.9). 1 each 0   dapagliflozin  propanediol (FARXIGA ) 5 MG TABS tablet Take 1 tablet (5 mg total) by mouth daily before breakfast. 90 tablet 3   gabapentin  (NEURONTIN ) 300 MG capsule Take 300 mg by mouth 2 (two) times daily.     glucose blood (ONETOUCH ULTRA TEST) test strip Use as instructed 200 each 0   glucose blood (ONETOUCH ULTRA TEST) test strip Use as instructed 200 each 0   Insulin  Pen Needle (RELION PEN NEEDLES) 31G X 6 MM MISC To use w/ Lantus  100 each 3   isosorbide  mononitrate (IMDUR ) 30 MG 24 hr tablet Take 1 tablet (30 mg total) by mouth daily. You can take an additional half tablet as needed for chest pain 45 tablet 1   Lancets (ONETOUCH DELICA PLUS LANCET33G) MISC Check blood sugars twice daily 200 each 12   LANTUS  100 UNIT/ML injection Inject 0.22 mLs (22 Units total) into the skin daily. 10 mL 1   metoprolol  tartrate (LOPRESSOR ) 25 MG tablet Take 1 tablet (25 mg total) by mouth 2 (two) times daily. 180 tablet 3   morphine  (MSIR) 15 MG tablet Take 0.5 tablets (7.5 mg total) by mouth every 4 (four) hours as needed for severe pain. 7 tablet 0   nitroGLYCERIN  (NITROSTAT ) 0.4 MG SL tablet Place 1 tablet (0.4 mg total) under the tongue every 5 (five) minutes as needed for chest pain. 50 tablet 3   ofloxacin  (FLOXIN  OTIC) 0.3 % OTIC solution Place 10 drops into the right ear daily. Use for 7- 10 days as needed (Patient taking differently: Place 10 drops into the right ear daily as needed (Infection/Pain).) 5 mL 1   ondansetron  (ZOFRAN -ODT) 4 MG disintegrating tablet 4mg  ODT q4 hours prn nausea/vomit 20 tablet 0   oxyCODONE -acetaminophen  (PERCOCET) 5-325 MG tablet Take 1 tablet by mouth every 4 (four) hours as needed for severe pain. 20 tablet 0   rosuvastatin  (CRESTOR ) 40 MG tablet Take 1 tablet (40 mg total) by mouth daily. 30 tablet 5   No current facility-administered medications on file prior to visit.     Review of Systems:  As per HPI- otherwise negative.   Physical Examination: There were no vitals filed for this visit. There were no vitals filed for this visit. There is no height or weight on file to calculate BMI. Ideal Body Weight:    GEN: no acute distress. HEENT: Atraumatic, Normocephalic.  Ears and Nose: No external  deformity. CV: RRR, No M/G/R. No JVD. No thrill. No extra heart sounds. PULM: CTA B, no wheezes, crackles, rhonchi. No retractions. No resp. distress. No accessory muscle use. ABD: S, NT, ND, +BS. No rebound. No HSM. EXTR: No c/c/e PSYCH: Normally interactive. Conversant.    Assessment and Plan: ***  Signed Gates Kasal, MD

## 2024-02-07 ENCOUNTER — Ambulatory Visit (INDEPENDENT_AMBULATORY_CARE_PROVIDER_SITE_OTHER): Admitting: Family Medicine

## 2024-02-07 ENCOUNTER — Encounter: Payer: Self-pay | Admitting: Family Medicine

## 2024-02-07 VITALS — BP 160/84 | HR 64 | Ht 65.0 in | Wt 164.0 lb

## 2024-02-07 DIAGNOSIS — E785 Hyperlipidemia, unspecified: Secondary | ICD-10-CM

## 2024-02-07 DIAGNOSIS — Z91199 Patient's noncompliance with other medical treatment and regimen due to unspecified reason: Secondary | ICD-10-CM | POA: Diagnosis not present

## 2024-02-07 DIAGNOSIS — Z758 Other problems related to medical facilities and other health care: Secondary | ICD-10-CM | POA: Diagnosis not present

## 2024-02-07 DIAGNOSIS — Z794 Long term (current) use of insulin: Secondary | ICD-10-CM

## 2024-02-07 DIAGNOSIS — E118 Type 2 diabetes mellitus with unspecified complications: Secondary | ICD-10-CM | POA: Diagnosis not present

## 2024-02-07 DIAGNOSIS — I10 Essential (primary) hypertension: Secondary | ICD-10-CM | POA: Diagnosis not present

## 2024-02-07 DIAGNOSIS — Z951 Presence of aortocoronary bypass graft: Secondary | ICD-10-CM | POA: Diagnosis not present

## 2024-02-07 DIAGNOSIS — Z603 Acculturation difficulty: Secondary | ICD-10-CM

## 2024-02-07 MED ORDER — DAPAGLIFLOZIN PROPANEDIOL 5 MG PO TABS: 5.0000 mg | ORAL_TABLET | Freq: Every day | ORAL | 3 refills | Status: DC

## 2024-02-07 MED ORDER — DEXCOM G7 SENSOR MISC: 3 refills | Status: DC

## 2024-02-07 MED ORDER — LANTUS SOLOSTAR 100 UNIT/ML ~~LOC~~ SOPN: 24.0000 [IU] | PEN_INJECTOR | Freq: Every day | SUBCUTANEOUS | 99 refills | Status: DC

## 2024-02-07 MED ORDER — PEN NEEDLES 30G X 5 MM MISC: 99 refills | Status: DC

## 2024-02-07 MED ORDER — ROSUVASTATIN CALCIUM 40 MG PO TABS: 40.0000 mg | ORAL_TABLET | Freq: Every day | ORAL | 3 refills | Status: DC

## 2024-02-07 MED ORDER — LOSARTAN POTASSIUM 25 MG PO TABS: 25.0000 mg | ORAL_TABLET | Freq: Every day | ORAL | 3 refills | Status: DC

## 2024-02-07 NOTE — Patient Instructions (Addendum)
 It was good to see you again today I will be in touch with your labs Lets change your insulin  from the syringe to the insulin  pen; I do think this will be easier for you to use However it is the exact same medication [place] I would not expect you to have any sort of allergic reaction  Lets increase your dose of insulin  slightly to 24 units per day  Please start back on your cholesterol medicine, rosuvastatin   We will have you try losartan for blood pressure, 25 mg once daily  For dry skin-recommend applying a thick moisturizer such as Cetaphil cream or Aquaphor ointment before bed.  Put gloves on over the top and leave on overnight

## 2024-02-08 ENCOUNTER — Encounter: Payer: Self-pay | Admitting: Family Medicine

## 2024-02-08 LAB — BASIC METABOLIC PANEL WITH GFR
BUN: 21 mg/dL (ref 6–23)
CO2: 29 meq/L (ref 19–32)
Calcium: 9.6 mg/dL (ref 8.4–10.5)
Chloride: 104 meq/L (ref 96–112)
Creatinine, Ser: 1.36 mg/dL (ref 0.40–1.50)
GFR: 51.44 mL/min — ABNORMAL LOW (ref >60.00)
Glucose, Bld: 61 mg/dL — ABNORMAL LOW (ref 70–99)
Potassium: 3.8 meq/L (ref 3.5–5.1)
Sodium: 142 meq/L (ref 135–145)

## 2024-02-08 LAB — HEMOGLOBIN A1C: Hgb A1c MFr Bld: 9.5 % — ABNORMAL HIGH (ref 4.6–6.5)

## 2024-02-21 ENCOUNTER — Ambulatory Visit: Payer: Self-pay | Admitting: Family Medicine

## 2024-02-27 ENCOUNTER — Ambulatory Visit: Payer: Self-pay | Admitting: *Deleted

## 2024-02-27 NOTE — Telephone Encounter (Signed)
  Chief Complaint: insulin  pen not working as well to lower glucose as vial Symptoms: fatigue, elevated readings Frequency: daily Pertinent Negatives: Patient denies ever using increased dosing of vial- always 22 units Disposition: [] ED /[] Urgent Care (no appt availability in office) / [x] Appointment(In office/virtual)/ []  [place] [facility]/ [] Home Care/ [] Refused Recommended Disposition /[] [facility]/ []  Follow-up with PCP Additional Notes: May need medication adjustment- appointment scheduled   Copied from CRM [id number]. Topic: Clinical - Red Word Triage >> Feb 27, 2024  1:57 PM Abigail D wrote: Red Word that prompted transfer to Nurse Triage: High blood sugar. Insulin  changed from syringe to pen, inject to number 24. He has been injecting it but his blood sugar is still high after. Above 200, almost to 300. When he was using the syringe it worked a lot better dropped his blood sugar effectively. Reason for Disposition  [1] Caller has URGENT medication or insulin  pump question AND [2] triager unable to answer question  Answer Assessment - Initial Assessment Questions 1. NAME of MEDICINE: "What medicine(s) are you calling about?"     Lantus - SOLOSTAR VS  LANTUS  VIAL 2. QUESTION: "What is your question?" (e.g., double dose of medicine, side effect)     Vial dosing worked better and faster to lower glucose- the pen is not working as well. 3. PRESCRIBER: "Who prescribed the medicine?" Reason: if prescribed by specialist, call should be referred to that group.     PCP 4. SYMPTOMS: "Do you have any symptoms?" If Yes, ask: "What symptoms are you having?"  "How bad are the symptoms (e.g., mild, moderate, severe)     Fatigue Glucose is continuous monitor- Dexcom, eating makes it really high- and sometimes at night it gets high, fasting can be 200- patient may need adjustment  Still has questions about Ozempic  Answer Assessment - Initial Assessment Questions 1. BLOOD GLUCOSE:  "What is your blood glucose level?"      200-300 2. ONSET: "When did you check the blood glucose?"     chronic 3. USUAL RANGE: "What is your glucose level usually?" (e.g., usual fasting morning value, usual evening value)     Per patient- 200 fasting  5. TYPE 1 or 2:  "Do you know what type of diabetes you have?"  (e.g., Type 1, Type 2, Gestational; doesn't know)      Type 2 6. INSULIN : "Do you take insulin ?" "What type of insulin (s) do you use? What is the mode of delivery? (syringe, pen; injection or pump)?"      Recent change to pen- Lantus  Solostar 7. DIABETES PILLS: "Do you take any pills for your diabetes?" If Yes, ask: "Have you missed taking any pills recently?"     none 8. OTHER SYMPTOMS: "Do you have any symptoms?" (e.g., fever, frequent urination, difficulty breathing, dizziness, weakness, vomiting)     fatigue  Protocols used: Medication Question Call-A-AH, Diabetes - High Blood Sugar-A-AH

## 2024-02-28 ENCOUNTER — Ambulatory Visit: Admitting: Physician Assistant

## 2024-03-01 ENCOUNTER — Ambulatory Visit: Admitting: Physician Assistant

## 2024-03-30 NOTE — Progress Notes (Signed)
 [facility] [address] [place], [place] [address] [phone]  Date:  04/08/2024   Name:  Barry [name]   DOB:  03-04-1951   MRN:  [id number]  PCP:  Barry Harlene BROCKS, MD    Chief Complaint: Medical Management of Chronic Issues   History of Present Illness:  Barry [name] is a 73 y.o. very pleasant male patient who presents with the following:  Patient seen today for short-term follow-up.  Most recently seen by myself in May of this year  History of CAD status post NSTEMI and stenting x2, CABG x 4 vessels in 2022, chronic superior mesenteric artery dissection followed by vascular surgery, PAD, stroke in 2011, diabetes, hypertension, hyperlipidemia, CKD-his care is also complicated by language barrier and some medical noncompliance/ confusion about medication   At our visit in May Barry [name] was taking only his Lantus , he had stopped all his other medications for reasons that were unclear Assessment and plan from that visit:  Patient seen today for follow-up.  History of diabetes, typically under poor control.  He has been wearing a Dexcom monitor for about the last 10 days and average sugars are about 220.  He is apparently using Lantus  23 units daily.  Will obtain A1c today, increase Lantus  to 24 units.  Prescribed Farxiga  encouraged to get back on this Strongly encouraged him to stick with his cholesterol medication, explained why this is important He is not taking blood pressure medicine, he tends to feel tired and dizzy with use of most medications.  As such we will have him try taking losartan  instead of a beta-blocker to see if he can tolerate it more easily Discussed how to manage dry skin  Lab Results  Component Value Date   HGBA1C 9.5 (H) 02/07/2024   Accompanied today by his wife and son who help with the history -they note his glucose is still running at least 300 He is using insulin  and metformin - they report he IS  taking farxiga  -they originally reported not taking it to my nurse  He does have a CBG that he is now using -reviewed his numbers.  It actually shows improvement of his glucose control.with average 217 and A1c 8.5  He also notes some knee pain that he is taking some sort of prescription which is running low.  They are not sure what medication this is.  They will check when they get home and let me know  Otherwise patient is feeling well and has no other particular concerns He is taking his cholesterol medication  Patient Active Problem List   Diagnosis Date Noted   Abnormal nuclear cardiac imaging test    S/P CABG x 4 09/21/2021   Abnormal nuclear stress test 08/31/2021   PVC's (premature ventricular contractions) 07/21/2021   CKD (chronic kidney disease) stage 3, GFR 30-59 ml/min (HCC) 07/21/2021   HFmrEF (heart failure with mildly reduced EF) 09/2015   Atherosclerotic peripheral vascular disease with ulceration (HCC) 04/03/2015   Aneurysm of iliac artery (HCC) 03/18/2014   Abdominal pain 03/18/2014   Syncope in setting of nausea and vomiting 03/07/14 03/08/2014   Coronary artery disease 02/28/2014   Non compliance with medical treatment 02/28/2014   Dyslipidemia, goal LDL below 70 02/28/2014   Bradycardia- beta blocker decreased 02/28/2014   Chronic diastolic CHF (congestive heart failure) (HCC) 02/28/2014   Type 2 diabetes mellitus with manifestations (HCC) 02/27/2014   TIA (transient ischemic attack) 02/26/2014  History of non-ST elevation myocardial infarction (NSTEMI) 02/26/2014   Weakness-Left arm / Lef 12/06/2013   Mesenteric artery stenosis (HCC) 12/06/2013   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HCC) 12/03/2012   Hypertension 11/10/2011   History of CVA in adulthood 11/10/2011   Language barrier 11/10/2011    Past Medical History:  Diagnosis Date   Anginal pain (HCC)    Arthritis    CAD (coronary artery disease)    LHC (02/26/14):  >> PCI:  Xience DES to pRCA // LHC  (03/10/14):  >> PCI:  Xience DES to mCFX // Myoview  2/16: low risk // Cath 2016: patent stents; stable dz - med Rx // Myoview  10/22: anterolateral ischemia, inferoapical infarct w peri infarct ischemia; EF 41; High Risk // Cath 12/22: LCx stent occl, severe LAD and Dx dz, diffuse RCA disease >> CABG   Carotid stenosis    a. Carotid US  (02/27/14):  Bilateral ICA 1-39%   CKD (chronic kidney disease) stage 3    Depression    Diabetes mellitus    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HCC)    a. Mesenteric Artery Duplex (7/15):  pSMA chronic dissection with aneurysmal dilation of 1.29 cm (VVS);  b.  Chest CTA (02/26/14):  IMPRESSION:  1. No aortic  dissection or other acute abnormality.  2. 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  3. Stable 17 mm ectasia of the left common iliac artery.  4. Atherosclerosis, including aortoiliac and coronary artery disease.    HFmrEF (heart failure with mildly reduced EF) 09/2015   Echo 6/15: EF 50-55 // Echo 1/17: mod LVH, EF 55%, inf-lat HK, Gr 1 DD, mild LAE // Echocardiogram 10/22: EF 40-45, Gr 1 DD, mildly reduced RVSF, RVSP 26.6, trivial Barry, trivial AI, post and ant-lat HK   History of kidney stones    Hyperlipidemia    Hypertension    Iliac artery aneurysm, left (HCC)    Myocardial infarction (HCC)    PVC's (premature ventricular contractions) 07/21/2021   Monitor 10/22:  NSR avg HR 71; no AF/Flutter; no high grade HB or pathologic pauses PVC burden 20%, rare supraventricular beats w/o sustained arrhythmia    Stroke (HCC) 09/26/2009    Past Surgical History:  Procedure Laterality Date   CARDIAC CATHETERIZATION     CARDIAC CATHETERIZATION N/A 06/25/2015   Procedure: Left Heart Cath and Coronary Angiography;  Surgeon: Barry JONETTA Cash, MD;  Location[facility] INVASIVE CV LAB;  Service: Cardiovascular;  Laterality: N/A;   COLONOSCOPY  2016   DJ-MAC-moviprep (adeq)-TA-79yr recall   COLONOSCOPY WITH PROPOFOL  N/A 07/23/2015   Procedure: COLONOSCOPY WITH  PROPOFOL ;  Surgeon: Barry SHAUNNA Cedar, MD;  Location: [facility] ENDOSCOPY;  Service: Endoscopy;  Laterality: N/A;   CORONARY ANGIOPLASTY     CORONARY ARTERY BYPASS GRAFT N/A 09/21/2021   Procedure: CORONARY ARTERY BYPASS GRAFTING (CABG)  X FOUR, ON PUMP, USING LEFT INTERNAL MAMMARY ARTERY AND RIGHT ENDOSCOPIC GREATER SAPHENOUS VEIN CONDUITS;  Surgeon: Shyrl Linnie KIDD, MD;  Location: [facility] OR;  Service: Open Heart Surgery;  Laterality: N/A;   CYSTOSCOPY/URETEROSCOPY/HOLMIUM LASER/STENT PLACEMENT Right 03/28/2023   Procedure: CYSTOSCOPY, RIGHT RETROGRADE PYELOGRAM, DIAGNOSTIC RIGHT URETEROSCOPY;  Surgeon: Elisabeth Valli JONETTA, MD;  Location: [facility] ORS;  Service: Urology;  Laterality: Right;   ENDOVEIN HARVEST OF GREATER SAPHENOUS VEIN  09/21/2021   Procedure: ENDOVEIN HARVEST OF GREATER SAPHENOUS VEIN;  Surgeon: Shyrl Linnie KIDD, MD;  Location: [facility] OR;  Service: Open Heart Surgery;;   EXTRACORPOREAL SHOCK WAVE LITHOTRIPSY Right 11/21/2022   Procedure: EXTRACORPOREAL SHOCK WAVE LITHOTRIPSY (ESWL);  Surgeon: Devere Barry Righter, MD;  Location[facility];  Service: Urology;  Laterality: Right;   LEFT HEART CATH AND CORONARY ANGIOGRAPHY N/A 08/31/2021   Procedure: LEFT HEART CATH AND CORONARY ANGIOGRAPHY;  Surgeon: Claudene Victory ORN, MD;  Location: [facility] INVASIVE CV LAB;  Service: Cardiovascular;  Laterality: N/A;   LEFT HEART CATHETERIZATION WITH CORONARY ANGIOGRAM N/A 02/26/2014   Procedure: LEFT HEART CATHETERIZATION WITH CORONARY ANGIOGRAM;  Surgeon: Deatrice DELENA Cage, MD;  Location: [facility] CATH LAB;  Service: Cardiovascular;  Laterality: N/A;   LEFT HEART CATHETERIZATION WITH CORONARY ANGIOGRAM N/A 03/10/2014   Procedure: LEFT HEART CATHETERIZATION WITH CORONARY ANGIOGRAM;  Surgeon: Candyce GORMAN Reek, MD;  Location: [facility] CATH LAB;  Service: Cardiovascular;  Laterality: N/A;   POLYPECTOMY  2016   TA   RIGHT HEART CATH AND CORONARY/GRAFT ANGIOGRAPHY N/A 07/01/2022   Procedure: RIGHT HEART CATH AND  CORONARY/GRAFT ANGIOGRAPHY;  Surgeon: Wonda Sharper, MD;  Location: [facility] INVASIVE CV LAB;  Service: Cardiovascular;  Laterality: N/A;   TEE WITHOUT CARDIOVERSION N/A 09/21/2021   Procedure: TRANSESOPHAGEAL ECHOCARDIOGRAM (TEE);  Surgeon: Shyrl Linnie KIDD, MD;  Location: [facility] OR;  Service: Open Heart Surgery;  Laterality: N/A;    Social History   Tobacco Use   Smoking status: Former    Current packs/day: 0.00    Average packs/day: 0.3 packs/day for 38.0 years (9.5 ttl pk-yrs)    Types: Cigarettes    Start date: 71    Quit date: 2008    Years since quitting: 17.5   Smokeless tobacco: Never  Vaping Use   Vaping status: Never Used  Substance Use Topics   Alcohol use: No    Alcohol/week: 0.0 standard drinks of alcohol   Drug use: No    Family History  Problem Relation Age of Onset   Diabetes Father    Hypertension Father    Heart attack Father    Stroke Neg Hx     Allergies  Allergen Reactions   Metformin  And Related     Pt feels that this causes constipation and will not take     Medication list has been reviewed and updated.  Current Outpatient Medications on File Prior to Visit  Medication Sig Dispense Refill   Continuous Glucose Sensor (DEXCOM G7 SENSOR) MISC Apply every 10 days for blood sugar monitoring 7 each 3   dapagliflozin  propanediol (FARXIGA ) 5 MG TABS tablet Take 1 tablet (5 mg total) by mouth daily before breakfast. 90 tablet 3   insulin  glargine (LANTUS  SOLOSTAR) 100 UNIT/ML Solostar Pen Inject 24 Units into the skin daily. 15 mL PRN   Insulin  Pen Needle (PEN NEEDLES) 30G X 5 MM MISC Use daily with insulin  100 each PRN   isosorbide  mononitrate (IMDUR ) 30 MG 24 hr tablet Take 1 tablet (30 mg total) by mouth daily. You can take an additional half tablet as needed for chest pain 45 tablet 1   Lancets (ONETOUCH DELICA PLUS LANCET33G) MISC Check blood sugars twice daily 200 each 12   losartan  (COZAAR ) 25 MG tablet Take 1 tablet (25 mg total) by mouth daily.  90 tablet 3   metoprolol  tartrate (LOPRESSOR ) 25 MG tablet Take 1 tablet (25 mg total) by mouth 2 (two) times daily. 180 tablet 3   nitroGLYCERIN  (NITROSTAT ) 0.4 MG SL tablet Place 1 tablet (0.4 mg total) under the tongue every 5 (five) minutes as needed for chest pain. 50 tablet 3   rosuvastatin  (CRESTOR ) 40 MG tablet Take 1 tablet (40 mg total) by mouth daily.  90 tablet 3   No current facility-administered medications on file prior to visit.    Review of Systems:  As per HPI- otherwise negative.   Physical Examination: Vitals:   04/08/24 1034  BP: (!) 144/80  Pulse: 70  SpO2: 96%   Vitals:   04/08/24 1034  Weight: 163 lb (73.9 kg)  Height: 5' 5 (1.651 m)   Body mass index is 27.12 kg/m. Ideal Body Weight: Weight in (lb) to have BMI = 25: 149.9  GEN: no acute distress.  Minimal overweight, looks well HEENT: Atraumatic, Normocephalic.  Ears and Nose: No external deformity. CV: RRR, No M/G/R. No JVD. No thrill. No extra heart sounds. PULM: CTA B, no wheezes, crackles, rhonchi. No retractions. No resp. distress. No accessory muscle use. EXTR: No c/c/e PSYCH: Normally interactive. Conversant     Assessment and Plan: Type 2 diabetes mellitus with complication, without long-term current use of insulin  (HCC) - Plan: Microalbumin / creatinine urine ratio  S/P CABG x 4  Dyslipidemia, goal LDL below 70  Language barrier  Essential hypertension  Non compliance with medical treatment  Patient seen today for diabetes follow-up.  They note his blood sugars are still running high, however his CBG does show progress.  We discussed how to gradually increase his Lantus  by about 6 more units as needed Will follow-up in 2 months for repeat A1c.   Check urine micro today Blood pressure control is reasonable-he is taking losartan .  Will leave his dose as is for now due to previous concerns about side effects and then medication discontinuation  BP Readings from Last 3 Encounters:   04/08/24 (!) 144/80  02/07/24 (!) 160/84  08/10/23 (!) 148/70     It was good to see you today. I encourage you to get the shingles vaccine series/Shingrix at your pharmacy Increase lanuts to 26 units now  In 5 days if morning glucose before eating is still over 150 increase to 28 units In 5 days if morning glucose before eating is still over 150 increase to 30 units Please see me in 2 months for a repeat A1c   Signed Harlene Schroeder, MD  7/16; Received labs- message to pt  Results for orders placed or performed in visit on 04/08/24  Microalbumin / creatinine urine ratio   Collection Time: 04/08/24 11:00 AM  Result Value Ref Range   Microalb, Ur 9.1 (H) 0.0 - 1.9 mg/dL   Creatinine,U 837.5 mg/dL   Microalb Creat Ratio 56.1 (H) 0.0 - 30.0 mg/g

## 2024-03-30 NOTE — Patient Instructions (Addendum)
 It was good to see you today. I encourage you to get the shingles vaccine series/Shingrix at your pharmacy Increase lanuts to 26 units now  In 5 days if morning glucose before eating is still over 150 increase to 28 units In 5 days if morning glucose before eating is still over 150 increase to 30 units Please see me in 2 months for a repeat A1c

## 2024-04-08 ENCOUNTER — Ambulatory Visit (INDEPENDENT_AMBULATORY_CARE_PROVIDER_SITE_OTHER): Admitting: Family Medicine

## 2024-04-08 VITALS — BP 144/80 | HR 70 | Ht 65.0 in | Wt 163.0 lb

## 2024-04-08 DIAGNOSIS — I10 Essential (primary) hypertension: Secondary | ICD-10-CM

## 2024-04-08 DIAGNOSIS — Z7984 Long term (current) use of oral hypoglycemic drugs: Secondary | ICD-10-CM | POA: Diagnosis not present

## 2024-04-08 DIAGNOSIS — Z758 Other problems related to medical facilities and other health care: Secondary | ICD-10-CM

## 2024-04-08 DIAGNOSIS — Z951 Presence of aortocoronary bypass graft: Secondary | ICD-10-CM

## 2024-04-08 DIAGNOSIS — Z794 Long term (current) use of insulin: Secondary | ICD-10-CM | POA: Diagnosis not present

## 2024-04-08 DIAGNOSIS — Z603 Acculturation difficulty: Secondary | ICD-10-CM

## 2024-04-08 DIAGNOSIS — Z91199 Patient's noncompliance with other medical treatment and regimen due to unspecified reason: Secondary | ICD-10-CM | POA: Diagnosis not present

## 2024-04-08 DIAGNOSIS — E118 Type 2 diabetes mellitus with unspecified complications: Secondary | ICD-10-CM

## 2024-04-08 DIAGNOSIS — E785 Hyperlipidemia, unspecified: Secondary | ICD-10-CM

## 2024-04-08 LAB — MICROALBUMIN / CREATININE URINE RATIO
Creatinine,U: 162.4 mg/dL
Microalb Creat Ratio: 56.1 mg/g — ABNORMAL HIGH (ref 0.0–30.0)
Microalb, Ur: 9.1 mg/dL — ABNORMAL HIGH (ref 0.0–1.9)

## 2024-04-10 ENCOUNTER — Encounter: Payer: Self-pay | Admitting: Family Medicine

## 2024-04-11 ENCOUNTER — Other Ambulatory Visit (HOSPITAL_BASED_OUTPATIENT_CLINIC_OR_DEPARTMENT_OTHER): Payer: Self-pay | Admitting: Family

## 2024-04-11 ENCOUNTER — Other Ambulatory Visit: Payer: Self-pay

## 2024-04-11 MED ORDER — ONETOUCH DELICA PLUS LANCET33G MISC: 12 refills | Status: DC

## 2024-04-12 ENCOUNTER — Telehealth: Payer: Self-pay | Admitting: Cardiovascular Disease

## 2024-04-12 NOTE — Telephone Encounter (Signed)
 [name] would like a c/b regarding whether pt is able to be prescribed Gabapentin  300 MG again. PT was prescribed medication by C. Walker back in 2023 and would like to start back taking due to leg issues. Please advise

## 2024-04-12 NOTE — Telephone Encounter (Signed)
 Informed patient's son that Dr. Wonda does not prescribe gabapentin . Encouraged patient's son to contact Dr. Watt that she has refilled most of his refills since 2015. Patient's son will call patient's PCP.

## 2024-05-17 ENCOUNTER — Telehealth: Payer: Self-pay | Admitting: *Deleted

## 2024-05-17 DIAGNOSIS — E118 Type 2 diabetes mellitus with unspecified complications: Secondary | ICD-10-CM

## 2024-05-17 MED ORDER — LANTUS SOLOSTAR 100 UNIT/ML ~~LOC~~ SOPN: 24.0000 [IU] | PEN_INJECTOR | Freq: Every day | SUBCUTANEOUS | 4 refills | Status: DC

## 2024-05-17 NOTE — Telephone Encounter (Signed)
 Copied from CRM [id number]. Topic: Clinical - Prescription Issue >> May 17, 2024 10:52 AM Roselie BROCKS wrote: Reason for CRM: Patient is requsting to fill LANTUS  SOLOSTAR) 100 UNIT/ML Solostar Pen, pharmacy will not fill ,states its too soon for insurance. Patient has been 30 units instead of 24 to control blood sugar. And needs approved

## 2024-05-24 NOTE — Telephone Encounter (Signed)
 Pt son called and states that medication will be out by this evening and has not heard anything about an update. Please advise [phone] Pros Sheikh

## 2024-05-24 NOTE — Telephone Encounter (Signed)
 Son also states that he is a Naval architect and may or may be available to answer Monnier if he cannot be reached please call his girlfriend Harlene at [phone]. Thank you

## 2024-05-30 ENCOUNTER — Ambulatory Visit: Payer: Self-pay

## 2024-05-30 DIAGNOSIS — E118 Type 2 diabetes mellitus with unspecified complications: Secondary | ICD-10-CM

## 2024-05-30 NOTE — Telephone Encounter (Signed)
 With use of Khmer interpreter, [id number]#, Barry [name], this RN attempted to contact patient, called number on file [phone]. Patient's son, Barry [name], answered call and states patient has no actual phone to contact him. Asked pt's son to have patient to call back, provider clinics number. [name] reports patient is doing well, nurse advised ED if symptoms worsen.

## 2024-05-30 NOTE — Telephone Encounter (Signed)
 Copied from CRM [id number]. Topic: Clinical - Red Word Triage >> May 30, 2024  4:57 PM Tinnie BROCKS wrote: Red Word that prompted transfer to Nurse Triage: Son on [facility] calling regarding pt missing his insulin . Blood sugar above 400s level, he says this is an emergency. He said he has been trying to get an earlier fill of his insulin  for over a week and has not heard anything. Answer Assessment - Initial Assessment Questions Advised ED if symptoms worsen.  Patient's on reports patient is out of insulin , blood sugar  has been running 400s last couple of days. Son reports Dexon shows blood sugar 250 currently. [name], reports pt is doing well. Son reports Dr. Ubaldo advised patient to use Lantus  27-30units; however, the prescription is  for Lantus  24 units; patient has ran out and medication not available until 06/07/24.  [name] reports have called 3x times for Lantus  prescription/problem  Last used insulin  yesterday, Lantus ;took Metformin  today  1. BLOOD GLUCOSE: What is your blood glucose level?      250      2. USUAL RANGE: What is your glucose level usually? (e.g., usual fasting morning value, usual evening value)     200  5. TYPE 1 or 2:  Do you know what type of diabetes you have?  (e.g., Type 1, Type 2, Gestational; doesn't know)      Type II 6. INSULIN : Do you take insulin ? What type of insulin (s) do you use? What is the mode of delivery? (syringe, pen; injection or pump)?      lantus  7. DIABETES PILLS: Do you take any pills for your diabetes? If Yes, ask: Have you missed taking any pills recently?     metformin  8. OTHER SYMPTOMS: Do you have any symptoms? (e.g., fever, frequent urination, difficulty breathing, dizziness, weakness, vomiting)    Patient son is not with patient and reports patient alert and oriented, denies fever, frequent urination, difficulty breathing, dizziness, weakness, vomiting. Dexon reading: 250 currently. Informed pt's son, will call pt with  interpreter.  Protocols used: Diabetes - High Blood Sugar-A-AH

## 2024-05-31 ENCOUNTER — Telehealth: Payer: Self-pay

## 2024-05-31 ENCOUNTER — Other Ambulatory Visit: Payer: Self-pay | Admitting: Family Medicine

## 2024-05-31 DIAGNOSIS — E118 Type 2 diabetes mellitus with unspecified complications: Secondary | ICD-10-CM

## 2024-05-31 MED ORDER — LANTUS SOLOSTAR 100 UNIT/ML ~~LOC~~ SOPN: 27.0000 [IU] | PEN_INJECTOR | Freq: Every day | SUBCUTANEOUS | 4 refills | Status: DC

## 2024-05-31 NOTE — Telephone Encounter (Signed)
Rx taken care of

## 2024-05-31 NOTE — Telephone Encounter (Signed)
 Copied from CRM [id number]. Topic: General - Other >> May 30, 2024  5:52 PM Dedra B wrote: Reason for CRM: Pt wife called to let provider know that pt is okay now.

## 2024-06-07 NOTE — Patient Instructions (Incomplete)
 It was good to see you today, I will be in touch with your labs Recommend several immunizations at your pharmacy-RSV, shingles, COVID booster

## 2024-06-07 NOTE — Progress Notes (Deleted)
 [facility] [address] [place], [place] [address] [phone]  Date:  06/10/2024   Name:  Barry [name]   DOB:  1951/09/22   MRN:  [id number]  PCP:  Watt Harlene BROCKS, MD    Chief Complaint: No chief complaint on file.   History of Present Illness:  Barry [name] is a 73 y.o. very pleasant male patient who presents with the following:  Patient seen today for periodic follow-up.  I saw him most recently in July History of CAD status post NSTEMI and stenting x2, CABG x 4 vessels in 2022, chronic superior mesenteric artery dissection followed by vascular surgery, PAD, stroke in 2011, diabetes, hypertension, hyperlipidemia, CKD-his care is also complicated by language barrier and some medical noncompliance/ confusion about medication   His A1c had been 10 about a year ago, most recently 9.5 in May I instructed him to gradually increase his Lantus  from 26 to 30 units depending on morning blood sugars at our last visit  He is also using Farxiga  5 Not on metformin  due to intolerance Crestor , metoprolol , losartan , Imdur   Needs a flu shot Recommend Shingrix Recommend eye exam Recommend COVID booster  Discussed the use of AI scribe software for clinical note transcription with the patient, who gave verbal consent to proceed.  History of Present Illness    Patient Active Problem List   Diagnosis Date Noted   Abnormal nuclear cardiac imaging test    S/P CABG x 4 09/21/2021   Abnormal nuclear stress test 08/31/2021   PVC's (premature ventricular contractions) 07/21/2021   CKD (chronic kidney disease) stage 3, GFR 30-59 ml/min (HCC) 07/21/2021   HFmrEF (heart failure with mildly reduced EF) 09/2015   Atherosclerotic peripheral vascular disease with ulceration (HCC) 04/03/2015   Aneurysm of iliac artery (HCC) 03/18/2014   Abdominal pain 03/18/2014   Syncope in setting of nausea and vomiting 03/07/14 03/08/2014   Coronary  artery disease 02/28/2014   Non compliance with medical treatment 02/28/2014   Dyslipidemia, goal LDL below 70 02/28/2014   Bradycardia- beta blocker decreased 02/28/2014   Chronic diastolic CHF (congestive heart failure) (HCC) 02/28/2014   Type 2 diabetes mellitus with manifestations (HCC) 02/27/2014   TIA (transient ischemic attack) 02/26/2014   History of non-ST elevation myocardial infarction (NSTEMI) 02/26/2014   Weakness-Left arm / Lef 12/06/2013   Mesenteric artery stenosis (HCC) 12/06/2013   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HCC) 12/03/2012   Hypertension 11/10/2011   History of CVA in adulthood 11/10/2011   Language barrier 11/10/2011    Past Medical History:  Diagnosis Date   Anginal pain (HCC)    Arthritis    CAD (coronary artery disease)    LHC (02/26/14):  >> PCI:  Xience DES to pRCA // LHC (03/10/14):  >> PCI:  Xience DES to mCFX // Myoview  2/16: low risk // Cath 2016: patent stents; stable dz - med Rx // Myoview  10/22: anterolateral ischemia, inferoapical infarct w peri infarct ischemia; EF 41; High Risk // Cath 12/22: LCx stent occl, severe LAD and Dx dz, diffuse RCA disease >> CABG   Carotid stenosis    a. Carotid US  (02/27/14):  Bilateral ICA 1-39%   CKD (chronic kidney disease) stage 3    Depression    Diabetes mellitus    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HCC)    a. Mesenteric Artery Duplex (7/15):  pSMA chronic dissection with aneurysmal dilation of 1.29 cm (VVS);  b.  Chest CTA (02/26/14):  IMPRESSION:  1. No aortic  dissection or other acute abnormality.  2. 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  3. Stable 17 mm ectasia of the left common iliac artery.  4. Atherosclerosis, including aortoiliac and coronary artery disease.    HFmrEF (heart failure with mildly reduced EF) 09/2015   Echo 6/15: EF 50-55 // Echo 1/17: mod LVH, EF 55%, inf-lat HK, Gr 1 DD, mild LAE // Echocardiogram 10/22: EF 40-45, Gr 1 DD, mildly reduced RVSF, RVSP 26.6, trivial MR, trivial AI,  post and ant-lat HK   History of kidney stones    Hyperlipidemia    Hypertension    Iliac artery aneurysm, left (HCC)    Myocardial infarction (HCC)    PVC's (premature ventricular contractions) 07/21/2021   Monitor 10/22:  NSR avg HR 71; no AF/Flutter; no high grade HB or pathologic pauses PVC burden 20%, rare supraventricular beats w/o sustained arrhythmia    Stroke (HCC) 09/26/2009    Past Surgical History:  Procedure Laterality Date   CARDIAC CATHETERIZATION     CARDIAC CATHETERIZATION N/A 06/25/2015   Procedure: Left Heart Cath and Coronary Angiography;  Surgeon: Lonni JONETTA Cash, MD;  Location: [facility] INVASIVE CV LAB;  Service: Cardiovascular;  Laterality: N/A;   COLONOSCOPY  2016   DJ-MAC-moviprep (adeq)-TA-1yr recall   COLONOSCOPY WITH PROPOFOL  N/A 07/23/2015   Procedure: COLONOSCOPY WITH PROPOFOL ;  Surgeon: Toribio SHAUNNA Cedar, MD;  Location: [facility] ENDOSCOPY;  Service: Endoscopy;  Laterality: N/A;   CORONARY ANGIOPLASTY     CORONARY ARTERY BYPASS GRAFT N/A 09/21/2021   Procedure: CORONARY ARTERY BYPASS GRAFTING (CABG)  X FOUR, ON PUMP, USING LEFT INTERNAL MAMMARY ARTERY AND RIGHT ENDOSCOPIC GREATER SAPHENOUS VEIN CONDUITS;  Surgeon: Shyrl Linnie KIDD, MD;  Location: [facility] OR;  Service: Open Heart Surgery;  Laterality: N/A;   CYSTOSCOPY/URETEROSCOPY/HOLMIUM LASER/STENT PLACEMENT Right 03/28/2023   Procedure: CYSTOSCOPY, RIGHT RETROGRADE PYELOGRAM, DIAGNOSTIC RIGHT URETEROSCOPY;  Surgeon: Elisabeth Valli JONETTA, MD;  Location: [facility] ORS;  Service: Urology;  Laterality: Right;   ENDOVEIN HARVEST OF GREATER SAPHENOUS VEIN  09/21/2021   Procedure: ENDOVEIN HARVEST OF GREATER SAPHENOUS VEIN;  Surgeon: Shyrl Linnie KIDD, MD;  Location: [facility] OR;  Service: Open Heart Surgery;;   EXTRACORPOREAL SHOCK WAVE LITHOTRIPSY Right 11/21/2022   Procedure: EXTRACORPOREAL SHOCK WAVE LITHOTRIPSY (ESWL);  Surgeon: Devere Lonni Righter, MD;  Location: [facility];  Service: Urology;  Laterality:  Right;   LEFT HEART CATH AND CORONARY ANGIOGRAPHY N/A 08/31/2021   Procedure: LEFT HEART CATH AND CORONARY ANGIOGRAPHY;  Surgeon: Claudene Victory ORN, MD;  [facility]: [facility] INVASIVE CV LAB;  Service: Cardiovascular;  Laterality: N/A;   LEFT HEART CATHETERIZATION WITH CORONARY ANGIOGRAM N/A 02/26/2014   Procedure: LEFT HEART CATHETERIZATION WITH CORONARY ANGIOGRAM;  Surgeon: Deatrice DELENA Cage, MD;  Location: [facility] CATH LAB;  Service: Cardiovascular;  Laterality: N/A;   LEFT HEART CATHETERIZATION WITH CORONARY ANGIOGRAM N/A 03/10/2014   Procedure: LEFT HEART CATHETERIZATION WITH CORONARY ANGIOGRAM;  Surgeon: Candyce GORMAN Reek, MD;  Location: [facility] CATH LAB;  Service: Cardiovascular;  Laterality: N/A;   POLYPECTOMY  2016   TA   RIGHT HEART CATH AND CORONARY/GRAFT ANGIOGRAPHY N/A 07/01/2022   Procedure: RIGHT HEART CATH AND CORONARY/GRAFT ANGIOGRAPHY;  Surgeon: Wonda Sharper, MD;  Location: [facility] INVASIVE CV LAB;  Service: Cardiovascular;  Laterality: N/A;   TEE WITHOUT CARDIOVERSION N/A 09/21/2021   Procedure: TRANSESOPHAGEAL ECHOCARDIOGRAM (TEE);  Surgeon: Shyrl Linnie KIDD, MD;  Location: [facility] OR;  Service: Open Heart Surgery;  Laterality: N/A;    Social History  Tobacco Use   Smoking status: Former    Current packs/day: 0.00    Average packs/day: 0.3 packs/day for 38.0 years (9.5 ttl pk-yrs)    Types: Cigarettes    Start date: 31    Quit date: 2008    Years since quitting: 17.7   Smokeless tobacco: Never  Vaping Use   Vaping status: Never Used  Substance Use Topics   Alcohol use: No    Alcohol/week: 0.0 standard drinks of alcohol   Drug use: No    Family History  Problem Relation Age of Onset   Diabetes Father    Hypertension Father    Heart attack Father    Stroke Neg Hx     Allergies  Allergen Reactions   Metformin  And Related     Pt feels that this causes constipation and will not take     Medication list has been reviewed and updated.  Current Outpatient Medications on File  Prior to Visit  Medication Sig Dispense Refill   Continuous Glucose Sensor (DEXCOM G7 SENSOR) MISC Apply every 10 days for blood sugar monitoring 7 each 3   dapagliflozin  propanediol (FARXIGA ) 5 MG TABS tablet Take 1 tablet (5 mg total) by mouth daily before breakfast. 90 tablet 3   insulin  glargine (LANTUS  SOLOSTAR) 100 UNIT/ML Solostar Pen Inject 27-30 Units into the skin daily. 30 mL 4   Insulin  Pen Needle (PEN NEEDLES) 30G X 5 MM MISC Use daily with insulin  100 each PRN   isosorbide  mononitrate (IMDUR ) 30 MG 24 hr tablet Take 1 tablet (30 mg total) by mouth daily. You can take an additional half tablet as needed for chest pain 45 tablet 1   Lancets (ONETOUCH DELICA PLUS LANCET33G) MISC Check blood sugars twice daily 200 each 12   losartan  (COZAAR ) 25 MG tablet Take 1 tablet (25 mg total) by mouth daily. 90 tablet 3   metoprolol  tartrate (LOPRESSOR ) 25 MG tablet Take 1 tablet (25 mg total) by mouth 2 (two) times daily. 180 tablet 3   nitroGLYCERIN  (NITROSTAT ) 0.4 MG SL tablet Place 1 tablet (0.4 mg total) under the tongue every 5 (five) minutes as needed for chest pain. 50 tablet 3   rosuvastatin  (CRESTOR ) 40 MG tablet Take 1 tablet (40 mg total) by mouth daily. 90 tablet 3   No current facility-administered medications on file prior to visit.    Review of Systems:  As per HPI- otherwise negative.   Physical Examination: There were no vitals filed for this visit. There were no vitals filed for this visit. There is no height or weight on file to calculate BMI. Ideal Body Weight:    GEN: no acute distress. HEENT: Atraumatic, Normocephalic.  Ears and Nose: No external deformity. CV: RRR, No M/G/R. No JVD. No thrill. No extra heart sounds. PULM: CTA B, no wheezes, crackles, rhonchi. No retractions. No resp. distress. No accessory muscle use. ABD: S, NT, ND, +BS. No rebound. No HSM. EXTR: No c/c/e PSYCH: Normally interactive. Conversant.    Assessment and Plan: No diagnosis  found.  Assessment & Plan   Signed Harlene Schroeder, MD

## 2024-06-10 ENCOUNTER — Ambulatory Visit: Admitting: Family Medicine

## 2024-06-10 DIAGNOSIS — E785 Hyperlipidemia, unspecified: Secondary | ICD-10-CM

## 2024-06-10 DIAGNOSIS — E1165 Type 2 diabetes mellitus with hyperglycemia: Secondary | ICD-10-CM

## 2024-06-10 DIAGNOSIS — Z603 Acculturation difficulty: Secondary | ICD-10-CM

## 2024-06-10 DIAGNOSIS — Z125 Encounter for screening for malignant neoplasm of prostate: Secondary | ICD-10-CM

## 2024-06-10 DIAGNOSIS — Z951 Presence of aortocoronary bypass graft: Secondary | ICD-10-CM

## 2024-06-10 DIAGNOSIS — I10 Essential (primary) hypertension: Secondary | ICD-10-CM

## 2024-06-10 DIAGNOSIS — N1831 Chronic kidney disease, stage 3a: Secondary | ICD-10-CM

## 2024-06-20 NOTE — Progress Notes (Deleted)
 [facility] at [facility] [address], [address] [place], [place] [address] [phone]  Date:  06/24/2024   Name:  Barry [name]   DOB:  Sep 21, 1951   MRN:  [id number]  PCP:  Watt Harlene BROCKS, MD    Chief Complaint: No chief complaint on file.   History of Present Illness:  Barry [name] is a 73 y.o. very pleasant male patient who presents with the following:  Patient seen today for periodic follow-up.  I saw him most recently in July History of CAD status post NSTEMI and stenting x2, CABG x 4 vessels in 2022, chronic superior mesenteric artery dissection followed by vascular surgery, PAD, stroke in 2011, diabetes, hypertension, hyperlipidemia, CKD-his care is also complicated by language barrier and some medical noncompliance/ confusion about medication   His A1c had been 10 about a year ago, most recently 9.5 in May I instructed him to gradually increase his Lantus  from 26 to 30 units depending on morning blood sugars at our last visit  He is also using Farxiga  5 Not on metformin  due to intolerance Crestor , metoprolol , losartan , Imdur   Needs a flu shot Recommend Shingrix Recommend eye exam Recommend COVID booster  Lab Results  Component Value Date   HGBA1C 9.5 (H) 02/07/2024     Discussed the use of AI scribe software for clinical note transcription with the patient, who gave verbal consent to proceed.  History of Present Illness    Patient Active Problem List   Diagnosis Date Noted   Abnormal nuclear cardiac imaging test    S/P CABG x 4 09/21/2021   Abnormal nuclear stress test 08/31/2021   PVC's (premature ventricular contractions) 07/21/2021   CKD (chronic kidney disease) stage 3, GFR 30-59 ml/min (HCC) 07/21/2021   HFmrEF (heart failure with mildly reduced EF) 09/2015   Atherosclerotic peripheral vascular disease with ulceration (HCC) 04/03/2015   Aneurysm of iliac artery 03/18/2014   Abdominal pain 03/18/2014   Syncope  in setting of nausea and vomiting 03/07/14 03/08/2014   Coronary artery disease 02/28/2014   Non compliance with medical treatment 02/28/2014   Dyslipidemia, goal LDL below 70 02/28/2014   Bradycardia- beta blocker decreased 02/28/2014   Chronic diastolic CHF (congestive heart failure) (HCC) 02/28/2014   Type 2 diabetes mellitus with manifestations (HCC) 02/27/2014   TIA (transient ischemic attack) 02/26/2014   History of non-ST elevation myocardial infarction (NSTEMI) 02/26/2014   Weakness-Left arm / Lef 12/06/2013   Mesenteric artery stenosis 12/06/2013   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HCC) 12/03/2012   Hypertension 11/10/2011   History of CVA in adulthood 11/10/2011   Language barrier 11/10/2011    Past Medical History:  Diagnosis Date   Anginal pain    Arthritis    CAD (coronary artery disease)    LHC (02/26/14):  >> PCI:  Xience DES to pRCA // LHC (03/10/14):  >> PCI:  Xience DES to mCFX // Myoview  2/16: low risk // Cath 2016: patent stents; stable dz - med Rx // Myoview  10/22: anterolateral ischemia, inferoapical infarct w peri infarct ischemia; EF 41; High Risk // Cath 12/22: LCx stent occl, severe LAD and Dx dz, diffuse RCA disease >> CABG   Carotid stenosis    a. Carotid US  (02/27/14):  Bilateral ICA 1-39%   CKD (chronic kidney disease) stage 3    Depression    Diabetes mellitus    Dissection of mesenteric artery    a. Mesenteric Artery Duplex (7/15):  pSMA chronic dissection with  aneurysmal dilation of 1.29 cm (VVS);  b.  Chest CTA (02/26/14):  IMPRESSION:  1. No aortic  dissection or other acute abnormality.  2. 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  3. Stable 17 mm ectasia of the left common iliac artery.  4. Atherosclerosis, including aortoiliac and coronary artery disease.    HFmrEF (heart failure with mildly reduced EF) 09/2015   Echo 6/15: EF 50-55 // Echo 1/17: mod LVH, EF 55%, inf-lat HK, Gr 1 DD, mild LAE // Echocardiogram 10/22: EF 40-45, Gr 1 DD, mildly  reduced RVSF, RVSP 26.6, trivial MR, trivial AI, post and ant-lat HK   History of kidney stones    Hyperlipidemia    Hypertension    Iliac artery aneurysm, left    Myocardial infarction (HCC)    PVC's (premature ventricular contractions) 07/21/2021   Monitor 10/22:  NSR avg HR 71; no AF/Flutter; no high grade HB or pathologic pauses PVC burden 20%, rare supraventricular beats w/o sustained arrhythmia    Stroke (HCC) 09/26/2009    Past Surgical History:  Procedure Laterality Date   CARDIAC CATHETERIZATION     CARDIAC CATHETERIZATION N/A 06/25/2015   Procedure: Left Heart Cath and Coronary Angiography;  Surgeon: Lonni JONETTA Cash, MD;  Location: [facility] INVASIVE CV LAB;  Service: Cardiovascular;  Laterality: N/A;   COLONOSCOPY  2016   DJ-MAC-moviprep (adeq)-TA-67yr recall   COLONOSCOPY WITH PROPOFOL  N/A 07/23/2015   Procedure: COLONOSCOPY WITH PROPOFOL ;  Surgeon: Toribio SHAUNNA Cedar, MD;  Location: [facility] ENDOSCOPY;  Service: Endoscopy;  Laterality: N/A;   CORONARY ANGIOPLASTY     CORONARY ARTERY BYPASS GRAFT N/A 09/21/2021   Procedure: CORONARY ARTERY BYPASS GRAFTING (CABG)  X FOUR, ON PUMP, USING LEFT INTERNAL MAMMARY ARTERY AND RIGHT ENDOSCOPIC GREATER SAPHENOUS VEIN CONDUITS;  Surgeon: Shyrl Linnie KIDD, MD;  Location: [facility] OR;  Service: Open Heart Surgery;  Laterality: N/A;   CYSTOSCOPY/URETEROSCOPY/HOLMIUM LASER/STENT PLACEMENT Right 03/28/2023   Procedure: CYSTOSCOPY, RIGHT RETROGRADE PYELOGRAM, DIAGNOSTIC RIGHT URETEROSCOPY;  Surgeon: Elisabeth Valli JONETTA, MD;  Location: [facility] ORS;  Service: Urology;  Laterality: Right;   ENDOVEIN HARVEST OF GREATER SAPHENOUS VEIN  09/21/2021   Procedure: ENDOVEIN HARVEST OF GREATER SAPHENOUS VEIN;  Surgeon: Shyrl Linnie KIDD, MD;  Location: [facility] OR;  Service: Open Heart Surgery;;   EXTRACORPOREAL SHOCK WAVE LITHOTRIPSY Right 11/21/2022   Procedure: EXTRACORPOREAL SHOCK WAVE LITHOTRIPSY (ESWL);  Surgeon: Devere Lonni Righter, MD;  Location: [facility];  Service: Urology;  Laterality: Right;   LEFT HEART CATH AND CORONARY ANGIOGRAPHY N/A 08/31/2021   Procedure: LEFT HEART CATH AND CORONARY ANGIOGRAPHY;  Surgeon: Claudene Victory ORN, MD;  Location: [facility] INVASIVE CV LAB;  Service: Cardiovascular;  Laterality: N/A;   LEFT HEART CATHETERIZATION WITH CORONARY ANGIOGRAM N/A 02/26/2014   Procedure: LEFT HEART CATHETERIZATION WITH CORONARY ANGIOGRAM;  Surgeon: Deatrice DELENA Cage, MD;  Location: [facility] CATH LAB;  Service: Cardiovascular;  Laterality: N/A;   LEFT HEART CATHETERIZATION WITH CORONARY ANGIOGRAM N/A 03/10/2014   Procedure: LEFT HEART CATHETERIZATION WITH CORONARY ANGIOGRAM;  Surgeon: Candyce GORMAN Reek, MD;  Location: [facility] CATH LAB;  Service: Cardiovascular;  Laterality: N/A;   POLYPECTOMY  2016   TA   RIGHT HEART CATH AND CORONARY/GRAFT ANGIOGRAPHY N/A 07/01/2022   Procedure: RIGHT HEART CATH AND CORONARY/GRAFT ANGIOGRAPHY;  Surgeon: Wonda Sharper, MD;  Location: [facility] INVASIVE CV LAB;  Service: Cardiovascular;  Laterality: N/A;   TEE WITHOUT CARDIOVERSION N/A 09/21/2021   Procedure: TRANSESOPHAGEAL ECHOCARDIOGRAM (TEE);  Surgeon: Shyrl Linnie KIDD, MD;  Location: [facility] OR;  Service: Open  Heart Surgery;  Laterality: N/A;    Social History   Tobacco Use   Smoking status: Former    Current packs/day: 0.00    Average packs/day: 0.3 packs/day for 38.0 years (9.5 ttl pk-yrs)    Types: Cigarettes    Start date: 73    Quit date: 2008    Years since quitting: 17.7   Smokeless tobacco: Never  Vaping Use   Vaping status: Never Used  Substance Use Topics   Alcohol use: No    Alcohol/week: 0.0 standard drinks of alcohol   Drug use: No    Family History  Problem Relation Age of Onset   Diabetes Father    Hypertension Father    Heart attack Father    Stroke Neg Hx     Allergies  Allergen Reactions   Metformin  And Related     Pt feels that this causes constipation and will not take     Medication list has been reviewed and  updated.  Current Outpatient Medications on File Prior to Visit  Medication Sig Dispense Refill   Continuous Glucose Sensor (DEXCOM G7 SENSOR) MISC Apply every 10 days for blood sugar monitoring 7 each 3   dapagliflozin  propanediol (FARXIGA ) 5 MG TABS tablet Take 1 tablet (5 mg total) by mouth daily before breakfast. 90 tablet 3   insulin  glargine (LANTUS  SOLOSTAR) 100 UNIT/ML Solostar Pen Inject 27-30 Units into the skin daily. 30 mL 4   Insulin  Pen Needle (PEN NEEDLES) 30G X 5 MM MISC Use daily with insulin  100 each PRN   isosorbide  mononitrate (IMDUR ) 30 MG 24 hr tablet Take 1 tablet (30 mg total) by mouth daily. You can take an additional half tablet as needed for chest pain 45 tablet 1   Lancets (ONETOUCH DELICA PLUS LANCET33G) MISC Check blood sugars twice daily 200 each 12   losartan  (COZAAR ) 25 MG tablet Take 1 tablet (25 mg total) by mouth daily. 90 tablet 3   metoprolol  tartrate (LOPRESSOR ) 25 MG tablet Take 1 tablet (25 mg total) by mouth 2 (two) times daily. 180 tablet 3   nitroGLYCERIN  (NITROSTAT ) 0.4 MG SL tablet Place 1 tablet (0.4 mg total) under the tongue every 5 (five) minutes as needed for chest pain. 50 tablet 3   rosuvastatin  (CRESTOR ) 40 MG tablet Take 1 tablet (40 mg total) by mouth daily. 90 tablet 3   No current facility-administered medications on file prior to visit.    Review of Systems:  As per HPI- otherwise negative.   Physical Examination: There were no vitals filed for this visit. There were no vitals filed for this visit. There is no height or weight on file to calculate BMI. Ideal Body Weight:    GEN: no acute distress. HEENT: Atraumatic, Normocephalic.  Ears and Nose: No external deformity. CV: RRR, No M/G/R. No JVD. No thrill. No extra heart sounds. PULM: CTA B, no wheezes, crackles, rhonchi. No retractions. No resp. distress. No accessory muscle use. ABD: S, NT, ND, +BS. No rebound. No HSM. EXTR: No c/c/e PSYCH: Normally interactive.  Conversant.    Assessment and Plan: No diagnosis found.  Assessment & Plan   Signed Harlene Schroeder, MD

## 2024-06-24 ENCOUNTER — Ambulatory Visit: Admitting: Family Medicine

## 2024-06-24 DIAGNOSIS — Z91199 Patient's noncompliance with other medical treatment and regimen due to unspecified reason: Secondary | ICD-10-CM

## 2024-06-24 DIAGNOSIS — Z603 Acculturation difficulty: Secondary | ICD-10-CM

## 2024-06-24 DIAGNOSIS — I10 Essential (primary) hypertension: Secondary | ICD-10-CM

## 2024-06-24 DIAGNOSIS — E118 Type 2 diabetes mellitus with unspecified complications: Secondary | ICD-10-CM

## 2024-06-24 DIAGNOSIS — E785 Hyperlipidemia, unspecified: Secondary | ICD-10-CM

## 2024-06-24 DIAGNOSIS — Z951 Presence of aortocoronary bypass graft: Secondary | ICD-10-CM

## 2024-08-26 ENCOUNTER — Encounter (HOSPITAL_COMMUNITY): Payer: Self-pay

## 2024-08-26 ENCOUNTER — Emergency Department (HOSPITAL_COMMUNITY)

## 2024-08-26 ENCOUNTER — Observation Stay (HOSPITAL_COMMUNITY)

## 2024-08-26 ENCOUNTER — Observation Stay (HOSPITAL_COMMUNITY)
Admission: EM | Admit: 2024-08-26 | Discharge: 2024-08-28 | DRG: 321 | Disposition: A | Attending: Emergency Medicine | Admitting: Emergency Medicine

## 2024-08-26 ENCOUNTER — Other Ambulatory Visit: Payer: Self-pay

## 2024-08-26 DIAGNOSIS — Z794 Long term (current) use of insulin: Secondary | ICD-10-CM

## 2024-08-26 DIAGNOSIS — E1165 Type 2 diabetes mellitus with hyperglycemia: Secondary | ICD-10-CM | POA: Diagnosis not present

## 2024-08-26 DIAGNOSIS — I10 Essential (primary) hypertension: Secondary | ICD-10-CM | POA: Diagnosis not present

## 2024-08-26 DIAGNOSIS — E7800 Pure hypercholesterolemia, unspecified: Secondary | ICD-10-CM | POA: Diagnosis not present

## 2024-08-26 DIAGNOSIS — R001 Bradycardia, unspecified: Secondary | ICD-10-CM | POA: Diagnosis present

## 2024-08-26 DIAGNOSIS — Z8673 Personal history of transient ischemic attack (TIA), and cerebral infarction without residual deficits: Secondary | ICD-10-CM

## 2024-08-26 DIAGNOSIS — N183 Chronic kidney disease, stage 3 unspecified: Secondary | ICD-10-CM | POA: Diagnosis present

## 2024-08-26 DIAGNOSIS — I25119 Atherosclerotic heart disease of native coronary artery with unspecified angina pectoris: Secondary | ICD-10-CM | POA: Diagnosis not present

## 2024-08-26 DIAGNOSIS — R079 Chest pain, unspecified: Secondary | ICD-10-CM | POA: Diagnosis not present

## 2024-08-26 DIAGNOSIS — E785 Hyperlipidemia, unspecified: Secondary | ICD-10-CM | POA: Diagnosis present

## 2024-08-26 DIAGNOSIS — I5022 Chronic systolic (congestive) heart failure: Secondary | ICD-10-CM

## 2024-08-26 DIAGNOSIS — R7989 Other specified abnormal findings of blood chemistry: Secondary | ICD-10-CM | POA: Diagnosis not present

## 2024-08-26 DIAGNOSIS — I2583 Coronary atherosclerosis due to lipid rich plaque: Secondary | ICD-10-CM

## 2024-08-26 DIAGNOSIS — I255 Ischemic cardiomyopathy: Secondary | ICD-10-CM

## 2024-08-26 DIAGNOSIS — I2510 Atherosclerotic heart disease of native coronary artery without angina pectoris: Secondary | ICD-10-CM | POA: Diagnosis not present

## 2024-08-26 DIAGNOSIS — Z951 Presence of aortocoronary bypass graft: Secondary | ICD-10-CM

## 2024-08-26 DIAGNOSIS — Z955 Presence of coronary angioplasty implant and graft: Secondary | ICD-10-CM

## 2024-08-26 DIAGNOSIS — I5023 Acute on chronic systolic (congestive) heart failure: Secondary | ICD-10-CM | POA: Diagnosis present

## 2024-08-26 LAB — CBC WITH DIFFERENTIAL/PLATELET
Abs Immature Granulocytes: 0.07 K/uL (ref 0.00–0.07)
Basophils Absolute: 0 K/uL (ref 0.0–0.1)
Basophils Relative: 0 %
Eosinophils Absolute: 0.2 K/uL (ref 0.0–0.5)
Eosinophils Relative: 2 %
HCT: 46.4 % (ref 39.0–52.0)
Hemoglobin: 14.8 g/dL (ref 13.0–17.0)
Immature Granulocytes: 1 %
Lymphocytes Relative: 12 %
Lymphs Abs: 1.3 K/uL (ref 0.7–4.0)
MCH: 28.6 pg (ref 26.0–34.0)
MCHC: 31.9 g/dL (ref 30.0–36.0)
MCV: 89.6 fL (ref 80.0–100.0)
Monocytes Absolute: 0.6 K/uL (ref 0.1–1.0)
Monocytes Relative: 6 %
Neutro Abs: 8.3 K/uL — ABNORMAL HIGH (ref 1.7–7.7)
Neutrophils Relative %: 79 %
Platelets: 150 K/uL (ref 150–400)
RBC: 5.18 MIL/uL (ref 4.22–5.81)
RDW: 12.2 % (ref 11.5–15.5)
WBC: 10.5 K/uL (ref 4.0–10.5)
nRBC: 0 % (ref 0.0–0.2)

## 2024-08-26 LAB — COMPREHENSIVE METABOLIC PANEL WITH GFR
ALT: 11 U/L (ref 0–44)
AST: 18 U/L (ref 15–41)
Albumin: 3.2 g/dL — ABNORMAL LOW (ref 3.5–5.0)
Alkaline Phosphatase: 80 U/L (ref 38–126)
Anion gap: 9 (ref 5–15)
BUN: 19 mg/dL (ref 8–23)
CO2: 28 mmol/L (ref 22–32)
Calcium: 8.5 mg/dL — ABNORMAL LOW (ref 8.9–10.3)
Chloride: 101 mmol/L (ref 98–111)
Creatinine, Ser: 1.29 mg/dL — ABNORMAL HIGH (ref 0.61–1.24)
GFR, Estimated: 58 mL/min — ABNORMAL LOW (ref >60)
Glucose, Bld: 295 mg/dL — ABNORMAL HIGH (ref 70–99)
Potassium: 4.2 mmol/L (ref 3.5–5.1)
Sodium: 138 mmol/L (ref 135–145)
Total Bilirubin: 0.6 mg/dL (ref 0.0–1.2)
Total Protein: 5.9 g/dL — ABNORMAL LOW (ref 6.5–8.1)

## 2024-08-26 LAB — ECHOCARDIOGRAM COMPLETE
Area-P 1/2: 2.54 cm2
Calc EF: 45.4 %
Height: 65 in
S' Lateral: 4.2 cm
Single Plane A2C EF: 45.8 %
Single Plane A4C EF: 52.7 %
Weight: 2640 [oz_av]

## 2024-08-26 LAB — LIPID PANEL
Cholesterol: 202 mg/dL — ABNORMAL HIGH (ref 0–200)
HDL: 32 mg/dL — ABNORMAL LOW (ref >40)
LDL Cholesterol: 148 mg/dL — ABNORMAL HIGH (ref 0–99)
Total CHOL/HDL Ratio: 6.3 ratio
Triglycerides: 112 mg/dL (ref <150)
VLDL: 22 mg/dL (ref 0–40)

## 2024-08-26 LAB — LIPASE, BLOOD: Lipase: 32 U/L (ref 11–51)

## 2024-08-26 LAB — GLUCOSE, CAPILLARY
Glucose-Capillary: 157 mg/dL — ABNORMAL HIGH (ref 70–99)
Glucose-Capillary: 147 mg/dL — ABNORMAL HIGH (ref 70–99)
Glucose-Capillary: 200 mg/dL — ABNORMAL HIGH (ref 70–99)

## 2024-08-26 LAB — HEMOGLOBIN A1C
Hgb A1c MFr Bld: 9.1 % — ABNORMAL HIGH (ref 4.8–5.6)
Mean Plasma Glucose: 214 mg/dL

## 2024-08-26 LAB — TROPONIN I (HIGH SENSITIVITY)
Troponin I (High Sensitivity): 61 ng/L — ABNORMAL HIGH (ref <18)
Troponin I (High Sensitivity): 55 ng/L — ABNORMAL HIGH (ref <18)

## 2024-08-26 LAB — BRAIN NATRIURETIC PEPTIDE: B Natriuretic Peptide: 88.3 pg/mL (ref 0.0–100.0)

## 2024-08-26 LAB — CBG MONITORING, ED: Glucose-Capillary: 220 mg/dL — ABNORMAL HIGH (ref 70–99)

## 2024-08-26 MED ORDER — ACETAMINOPHEN 325 MG PO TABS: 650.0000 mg | ORAL_TABLET | ORAL | Status: DC | PRN

## 2024-08-26 MED ORDER — NITROGLYCERIN 0.4 MG SL SUBL: 0.4000 mg | SUBLINGUAL_TABLET | SUBLINGUAL | Status: DC | PRN

## 2024-08-26 MED ORDER — ROSUVASTATIN CALCIUM 20 MG PO TABS
40.0000 mg | ORAL_TABLET | Freq: Every evening | ORAL | Status: DC
  Administered 2024-08-26 – 2024-08-27 (×2): 40 mg via ORAL
  Filled 2024-08-26 (×2): qty 2

## 2024-08-26 MED ORDER — HEPARIN BOLUS VIA INFUSION
2000.0000 [IU] | Freq: Once | INTRAVENOUS | Status: AC
  Administered 2024-08-26: 2000 [IU] via INTRAVENOUS
  Filled 2024-08-26: qty 2000

## 2024-08-26 MED ORDER — INSULIN GLARGINE-YFGN 100 UNIT/ML ~~LOC~~ SOLN: 32.0000 [IU] | Freq: Every day | SUBCUTANEOUS | Status: DC

## 2024-08-26 MED ORDER — HYDRALAZINE HCL 20 MG/ML IJ SOLN: 10.0000 mg | INTRAMUSCULAR | Status: DC | PRN

## 2024-08-26 MED ORDER — ALUM & MAG HYDROXIDE-SIMETH 200-200-20 MG/5ML PO SUSP: 30.0000 mL | Freq: Four times a day (QID) | ORAL | Status: DC | PRN

## 2024-08-26 MED ORDER — INSULIN ASPART 100 UNIT/ML IJ SOLN
0.0000 [IU] | Freq: Three times a day (TID) | INTRAMUSCULAR | Status: DC
  Administered 2024-08-26: 2 [IU] via SUBCUTANEOUS
  Administered 2024-08-26: 3 [IU] via SUBCUTANEOUS
  Administered 2024-08-26: 5 [IU] via SUBCUTANEOUS
  Administered 2024-08-27: 3 [IU] via SUBCUTANEOUS
  Administered 2024-08-28: 11 [IU] via SUBCUTANEOUS
  Filled 2024-08-26: qty 5
  Filled 2024-08-26: qty 3
  Filled 2024-08-26: qty 11
  Filled 2024-08-26: qty 3
  Filled 2024-08-26: qty 2

## 2024-08-26 MED ORDER — PERFLUTREN LIPID MICROSPHERE
1.0000 mL | INTRAVENOUS | Status: AC | PRN
  Administered 2024-08-26: 2 mL via INTRAVENOUS

## 2024-08-26 MED ORDER — HEPARIN (PORCINE) 25000 UT/250ML-% IV SOLN
950.0000 [IU]/h | INTRAVENOUS | Status: DC
  Administered 2024-08-26: 1050 [IU]/h via INTRAVENOUS
  Filled 2024-08-26: qty 250

## 2024-08-26 MED ORDER — ASPIRIN 81 MG PO TBEC
81.0000 mg | DELAYED_RELEASE_TABLET | Freq: Every day | ORAL | Status: DC
  Administered 2024-08-27 – 2024-08-28 (×2): 81 mg via ORAL
  Filled 2024-08-26 (×2): qty 1

## 2024-08-26 MED ORDER — ONDANSETRON HCL 4 MG/2ML IJ SOLN: 4.0000 mg | Freq: Four times a day (QID) | INTRAMUSCULAR | Status: DC | PRN

## 2024-08-26 MED ORDER — ENOXAPARIN SODIUM 40 MG/0.4ML IJ SOSY
40.0000 mg | PREFILLED_SYRINGE | INTRAMUSCULAR | Status: DC
  Administered 2024-08-26: 40 mg via SUBCUTANEOUS
  Filled 2024-08-26: qty 0.4

## 2024-08-26 MED ORDER — INSULIN ASPART 100 UNIT/ML IJ SOLN: 0.0000 [IU] | Freq: Every day | INTRAMUSCULAR | Status: DC

## 2024-08-26 MED ORDER — INSULIN GLARGINE-YFGN 100 UNIT/ML ~~LOC~~ SOLN
32.0000 [IU] | Freq: Every day | SUBCUTANEOUS | Status: DC
  Administered 2024-08-26 – 2024-08-28 (×2): 32 [IU] via SUBCUTANEOUS
  Filled 2024-08-26 (×3): qty 0.32

## 2024-08-26 MED ORDER — LOSARTAN POTASSIUM 25 MG PO TABS
25.0000 mg | ORAL_TABLET | Freq: Every day | ORAL | Status: DC
  Filled 2024-08-26: qty 1

## 2024-08-26 NOTE — Progress Notes (Signed)
 Patient arrived to unit from ED; CCMD verified; assessment completed, phone/call bell in reach; patient acclimated to unit;

## 2024-08-26 NOTE — Progress Notes (Signed)
 Patient refuses interpreter services.  The patient prefers the son or wife to interpret.

## 2024-08-26 NOTE — H&P (View-Only) (Signed)
 Cardiology Consultation  Patient ID: Barry [name] MRN: [id number]; DOB: 1951/03/14  Admit date: 08/26/2024 Date of Consult: 08/26/2024  PCP:  Barry Harlene BROCKS, MD   [facility] Providers Cardiologist:  Barry Fell, MD  Cardiology APP:  Barry Glendia DASEN, PA-C    Patient Profile: Barry [name] is a 73 y.o. male with a hx of CAD s/p CABG x 4 (LIMA-LAD, SVG-PDA, SVG-first diagonal, SVG-OM) in 2022, hyperlipidemia, chronic CHF with mildly reduced ejection fraction, chronic SMA dissection, PAD with penetrating ulcer in the left common iliac artery, history of stroke in 2011, diabetes, CKD 3a, hypertension, AAA who is being seen 08/26/2024 for the evaluation of chest pain at the request of Dr. Claudene.  History of Present Illness: Barry [name] has past medical history as listed above.  He presented to the [facility] emergency department on 08/26/2024 via EMS after being awakened from sleep with chest pain.  Patient described it as a burning pain across his chest, similar to chest pain is felt in the past.  Relevant workup on the ED includes: EKG shows sinus rhythm, HR 52, new ST depressions in leads V4-V5, CBC unremarkable, CMP showed creatinine 1.29 (around baseline) otherwise unremarkable, lipase negative, lipid panel showed LDL 148, troponin 61 ? 55, BNP normal at 88, CXR showed interstitial opacities.   While in the ED patient has been hypertensive with SBP 140s to 150s and bradycardic with HR 40s to 50s.  He was loaded with 324 mg of aspirin  by EMS.  He was admitted to the medicine service.  Cardiology was asked to consult in the setting of chest pain.  He is a patient of Dr. Fell.  Was last seen by Barry Beauvais, NP February 2024 for preoperative evaluation.  At this appointment he reported having no chest discomfort, tolerating all of his medications well.  He noted that his biggest complaint was constipation.  His medication regimen at this time included: Aspirin  81 mg daily, Farxiga  5 mg  daily, Imdur  30 mg daily, lisinopril  10 mg daily, Lopressor  25 mg twice daily, Crestor  40 mg daily, as needed sublingual nitroglycerin .  He was supposed to follow-up with our practice in 6 months, lost to follow-up.  Upon review of his dispense report it appears that most of his medications have no recent fill dates, very few last being filled May 2025 for 90-day supply.  He underwent right/left heart cath 07/01/2022 that showed: Multivessel CAD with patent LM, total occlusion of LCx, severe stenosis of proximal LAD, patent RCA with mild in-stent restenosis and mild to moderate nonobstructive plaque in the mid vessel.  S/p CABG with continued patency of LIMA to LAD, total occlusion of SVG conduits.  Normal right heart pressures.  After speaking to the patient and his wife who is present bedside, they agree with the history. They say that around 1 AM he was waking up with burning chest pain that radiated to his abdomen. He took SL NTG at home and this provided no relief. He reports no chest pain at the moment. Says he feels completely asymptomatic at this time. He denies any recent anginal symptoms with exertion or at rest. They both agree this was the first and only episode. They tell me that he has not missed any doses of his medications. Upon review of his dispense report it shows that many of them have not been filled recently and only a few were filled in 01/2024 with 90d supply. They continue to state he has not missed any doses.  Prior notes indicate that the patients son is typically the one to contact for medical history. I attempted to call the number listed for his son, Barry [name], however there was no answer.   Past Medical History:  Diagnosis Date   Anginal pain    Arthritis    CAD (coronary artery disease)    LHC (02/26/14):  >> PCI:  Xience DES to pRCA // LHC (03/10/14):  >> PCI:  Xience DES to [facility] // Myoview  2/16: low risk // Cath 2016: patent stents; stable dz - med Rx // Myoview  10/22:  anterolateral ischemia, inferoapical infarct w peri infarct ischemia; EF 41; High Risk // Cath 12/22: LCx stent occl, severe LAD and Dx dz, diffuse RCA disease >> CABG   Carotid stenosis    a. Carotid US  (02/27/14):  Bilateral ICA 1-39%   CKD (chronic kidney disease) stage 3    Depression    Diabetes mellitus    Dissection of mesenteric artery    a. Mesenteric Artery Duplex (7/15):  pSMA chronic dissection with aneurysmal dilation of 1.29 cm (VVS);  b.  Chest CTA (02/26/14):  IMPRESSION:  1. No aortic  dissection or other acute abnormality.  2. 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  3. Stable 17 mm ectasia of the left common iliac artery.  4. Atherosclerosis, including aortoiliac and coronary artery disease.    HFmrEF (heart failure with mildly reduced EF) 09/2015   Echo 6/15: EF 50-55 // Echo 1/17: mod LVH, EF 55%, inf-lat HK, Gr 1 DD, mild LAE // Echocardiogram 10/22: EF 40-45, Gr 1 DD, mildly reduced RVSF, RVSP 26.6, trivial MR, trivial AI, post and ant-lat HK   History of kidney stones    Hyperlipidemia    Hypertension    Iliac artery aneurysm, left    Myocardial infarction (HCC)    PVC's (premature ventricular contractions) 07/21/2021   Monitor 10/22:  NSR avg HR 71; no AF/Flutter; no high grade HB or pathologic pauses PVC burden 20%, rare supraventricular beats w/o sustained arrhythmia    Stroke (HCC) 09/26/2009   Past Surgical History:  Procedure Laterality Date   CARDIAC CATHETERIZATION     CARDIAC CATHETERIZATION N/A 06/25/2015   Procedure: Left Heart Cath and Coronary Angiography;  Surgeon: Lonni JONETTA Cash, MD;  Location: [facility] INVASIVE CV LAB;  Service: Cardiovascular;  Laterality: N/A;   COLONOSCOPY  2016   DJ-MAC-moviprep (adeq)-TA-17yr recall   COLONOSCOPY WITH PROPOFOL  N/A 07/23/2015   Procedure: COLONOSCOPY WITH PROPOFOL ;  Surgeon: Toribio SHAUNNA Cedar, MD;  Location: [facility] ENDOSCOPY;  Service: Endoscopy;  Laterality: N/A;   CORONARY ANGIOPLASTY     CORONARY ARTERY  BYPASS GRAFT N/A 09/21/2021   Procedure: CORONARY ARTERY BYPASS GRAFTING (CABG)  X FOUR, ON PUMP, USING LEFT INTERNAL MAMMARY ARTERY AND RIGHT ENDOSCOPIC GREATER SAPHENOUS VEIN CONDUITS;  Surgeon: Shyrl Linnie KIDD, MD;  Location: [facility] OR;  Service: Open Heart Surgery;  Laterality: N/A;   CYSTOSCOPY/URETEROSCOPY/HOLMIUM LASER/STENT PLACEMENT Right 03/28/2023   Procedure: CYSTOSCOPY, RIGHT RETROGRADE PYELOGRAM, DIAGNOSTIC RIGHT URETEROSCOPY;  Surgeon: Elisabeth Valli JONETTA, MD;  Location: [facility] ORS;  Service: Urology;  Laterality: Right;   ENDOVEIN HARVEST OF GREATER SAPHENOUS VEIN  09/21/2021   Procedure: ENDOVEIN HARVEST OF GREATER SAPHENOUS VEIN;  Surgeon: Shyrl Linnie KIDD, MD;  Location: [facility] OR;  Service: Open Heart Surgery;;   EXTRACORPOREAL SHOCK WAVE LITHOTRIPSY Right 11/21/2022   Procedure: EXTRACORPOREAL SHOCK WAVE LITHOTRIPSY (ESWL);  Surgeon: Devere Lonni Righter, MD;  Location: [facility];  Service: Urology;  Laterality: Right;   LEFT  HEART CATH AND CORONARY ANGIOGRAPHY N/A 08/31/2021   Procedure: LEFT HEART CATH AND CORONARY ANGIOGRAPHY;  Surgeon: Barry [name] Victory ORN, MD;  Location: [facility] INVASIVE CV LAB;  Service: Cardiovascular;  Laterality: N/A;   LEFT HEART CATHETERIZATION WITH CORONARY ANGIOGRAM N/A 02/26/2014   Procedure: LEFT HEART CATHETERIZATION WITH CORONARY ANGIOGRAM;  Surgeon: Deatrice DELENA Cage, MD;  Location: [facility] CATH LAB;  Service: Cardiovascular;  Laterality: N/A;   LEFT HEART CATHETERIZATION WITH CORONARY ANGIOGRAM N/A 03/10/2014   Procedure: LEFT HEART CATHETERIZATION WITH CORONARY ANGIOGRAM;  Surgeon: Candyce GORMAN Reek, MD;  Location[facility] CATH LAB;  Service: Cardiovascular;  Laterality: N/A;   POLYPECTOMY  2016   TA   RIGHT HEART CATH AND CORONARY/GRAFT ANGIOGRAPHY N/A 07/01/2022   Procedure: RIGHT HEART CATH AND CORONARY/GRAFT ANGIOGRAPHY;  Surgeon: Wonda Sharper, MD;  Location: [facility] INVASIVE CV LAB;  Service: Cardiovascular;  Laterality: N/A;   TEE WITHOUT  CARDIOVERSION N/A 09/21/2021   Procedure: TRANSESOPHAGEAL ECHOCARDIOGRAM (TEE);  Surgeon: Shyrl Linnie KIDD, MD;  Location: [facility] OR;  Service: Open Heart Surgery;  Laterality: N/A;    Home Medications:  Prior to Admission medications   Medication Sig Start Date End Date Taking? Authorizing Provider  dapagliflozin  propanediol (FARXIGA ) 5 MG TABS tablet Take 1 tablet (5 mg total) by mouth daily before breakfast. 02/07/24   Copland, Jessica C, MD  insulin  glargine (LANTUS  SOLOSTAR) 100 UNIT/ML Solostar Pen Inject 27-30 Units into the skin daily. 05/31/24   Copland, Harlene BROCKS, MD  isosorbide  mononitrate (IMDUR ) 30 MG 24 hr tablet Take 1 tablet (30 mg total) by mouth daily. You can take an additional half tablet as needed for chest pain 10/07/22   Wyn Jackee VEAR Mickey., NP  losartan  (COZAAR ) 25 MG tablet Take 1 tablet (25 mg total) by mouth daily. 02/07/24   Copland, Harlene BROCKS, MD  metoprolol  tartrate (LOPRESSOR ) 25 MG tablet Take 1 tablet (25 mg total) by mouth 2 (two) times daily. 10/12/21   Walker, Caitlin S, NP  nitroGLYCERIN  (NITROSTAT ) 0.4 MG SL tablet Place 1 tablet (0.4 mg total) under the tongue every 5 (five) minutes as needed for chest pain. 07/28/22   Copland, Harlene BROCKS, MD  rosuvastatin  (CRESTOR ) 40 MG tablet Take 1 tablet (40 mg total) by mouth daily. 02/07/24   Copland, Jessica C, MD    Scheduled Meds:  [START ON 08/27/2024] aspirin  EC  81 mg Oral Daily   enoxaparin  (LOVENOX ) injection  40 mg Subcutaneous Q24H   insulin  aspart  0-15 Units Subcutaneous TID WC   insulin  aspart  0-5 Units Subcutaneous QHS   insulin  glargine-yfgn  32 Units Subcutaneous Daily   rosuvastatin   40 mg Oral QPM   Continuous Infusions:  PRN Meds: acetaminophen , alum & mag hydroxide-simeth, hydrALAZINE , nitroGLYCERIN , ondansetron  (ZOFRAN ) IV  Allergies:    Allergies  Allergen Reactions   Metformin  And Related Other (See Comments)    Pt feels that this causes constipation and will not take    Social History:    Social History   Socioeconomic History   Marital status: Married    Spouse name: Not on file   Number of children: 3   Years of education: Not on file   Highest education level: Not on file  Occupational History   Occupation: Retired  Tobacco Use   Smoking status: Former    Current packs/day: 0.00    Average packs/day: 0.3 packs/day for 38.0 years (9.5 ttl pk-yrs)    Types: Cigarettes    Start date: 39    Quit date:  2008    Years since quitting: 17.9   Smokeless tobacco: Never  Vaping Use   Vaping status: Never Used  Substance and Sexual Activity   Alcohol use: No    Alcohol/week: 0.0 standard drinks of alcohol   Drug use: No   Sexual activity: Yes  Other Topics Concern   Not on file  Social History Narrative   Marital: married.      Lives: with son,wife.       Children: 3 children; 6 grandchildren      Employed: unemployed; disability unknown reason/CVA L sided weakness      Tobacco:  Quit 2012; smoked 40 years.       Alcohol: no drinking now; social in past.       Drugs: none       Exercise: sporadic.       ADLs:  No driving since CVA.   Social Drivers of Corporate Investment Banker Strain: Low Risk  (05/28/2021)   Overall Financial Resource Strain (CARDIA)    Difficulty of Paying Living Expenses: Not very hard  Food Insecurity: Food Insecurity Present (11/12/2021)   Hunger Vital Sign    Worried About Running Out of Food in the Last Year: Sometimes true    Ran Out of Food in the Last Year: Never true  Transportation Needs: No Transportation Needs (06/07/2021)   PRAPARE - Administrator, Civil Service (Medical): No    Lack of Transportation (Non-Medical): No  Physical Activity: Inactive (11/12/2021)   Exercise Vital Sign    Days of Exercise per Week: 0 days    Minutes of Exercise per Session: 0 min  Stress: Not on file  Social Connections: Not on file  Intimate Partner Violence: Not At Risk (11/12/2021)   Humiliation, Afraid, Rape, and Kick  questionnaire    Fear of Current or Ex-Partner: No    Emotionally Abused: No    Physically Abused: No    Sexually Abused: No    Family History:   Family History  Problem Relation Age of Onset   Diabetes Father    Hypertension Father    Heart attack Father    Stroke Neg Hx     ROS:  Please see the history of present illness.  All other ROS reviewed and negative.     Physical Exam/Data: Vitals:   08/26/24 0824 08/26/24 1124 08/26/24 1145 08/26/24 1212  BP: (!) 153/87 (!) 154/90 (!) 149/87 (!) 153/89  Pulse: (!) 57 (!) 55 (!) 59 (!) 52  Resp: 14 15 18 17   Temp:  98.3 F (36.8 C)  98.7 F (37.1 C)  TempSrc:  Oral  Oral  SpO2: 100% 100% 99% 100%  Weight:      Height:       No intake or output data in the 24 hours ending 08/26/24 1325    08/26/2024    3:53 AM 04/08/2024   10:34 AM 02/07/2024    1:04 PM  Last 3 Weights  Weight (lbs) 165 lb 163 lb 164 lb  Weight (kg) 74.844 kg 73.936 kg 74.39 kg     Body mass index is 27.46 kg/m.  General:  in no acute distress, laying flat without issue HEENT: normal Neck: no JVD Vascular: Distal pulses 2+ bilaterally Cardiac:  normal S1, S2; bradycardic Lungs:  clear to auscultation bilaterally Abd: soft, nontender Ext: no edema Musculoskeletal:  No deformities Skin: warm and dry  Neuro: no focal abnormalities noted Psych:  Normal affect   EKG:  The EKG was personally reviewed and demonstrates:  sinus bradycardia, HR 50s, PACs  Telemetry:  Telemetry was personally reviewed and demonstrates:  sinus rhythm, HR 52, new ST depressions in leads V4-V5  Relevant CV Studies:  Echocardiogram, 08/26/2024 Ordered, pending results  Right/left heart cath, 07/01/2022 Multivessel coronary artery disease with patency of the left main, total occlusion of the left circumflex, severe stenosis of the proximal LAD, and patency of the RCA with mild in-stent restenosis and mild to moderate nonobstructive plaquing in the mid vessel. Status post  aortocoronary bypass surgery with continued patency of the LIMA to LAD and total occlusion of the saphenous vein graft conduits Normal right heart pressures including normal wedge pressure   Recommendations: Medical therapy  Long-term monitor, 06/07/2022 Patient had a min HR of 39 bpm, max HR of 88 bpm, and avg HR of 55 bpm. Predominant underlying rhythm was Sinus Rhythm.  Isolated SVEs were rare (<1.0%), SVE Couplets were rare (<1.0%), and no SVE Triplets were present. Isolated VEs were rare (<1.0%), and no VE Couplets or VE Triplets were present. Ventricular Bigeminy and Trigeminy were present.    Summary: The basic rhythm is sinus bradycardia with an average heart rate of 55 bpm.  There are isolated ectopics with a low burden of both supraventricular and ventricular ectopics of less than 1%.  There were no sustained arrhythmias.  There is no atrial fibrillation or flutter.  There is no high-grade AV block.   Transesophageal echocardiogram, 09/21/2021 Left Ventricle: The left ventricle has mild- moderately reduced systolic function, with an ejection fraction of 40- 45% . The cavity size was normal. Concentric left ventricular hypertrophy.  Right Ventricle: The right ventricle has normal systolic function. The cavity was normal. There is no increase in right ventricular wall thickness.  Left Atrium: Left atrial size was dilated. No left atrial/ left atrial appendage thrombus was detected. The left atrial appendage is well visualized and there is no evidence of thrombus present.  Right Atrium: Right atrial size was normal in size.  Interatrial Septum: No atrial level shunt detected by color flow Doppler. There is no evidence of a patent foramen ovale.  Pericardium: There is no evidence of pericardial effusion.  Mitral Valve: The mitral valve is normal in structure. Mitral valve regurgitation is mild by color flow Doppler. The MR jet is centrally- directed. There is no evidence of mitral valve  vegetation.  Tricuspid Valve: The tricuspid valve was normal in structure. Tricuspid valve regurgitation is moderate by color flow Doppler.  Aortic Valve: The aortic valve is tricuspid Aortic valve regurgitation is trivial by color flow Doppler. There is no stenosis of the aortic valve.  Pulmonic Valve: The pulmonic valve was normal in structure. Pulmonic valve regurgitation is trivial by color flow Doppler.  Aorta: The aortic root and ascending aorta are normal in size and structure. There is evidence of plaque in the descending aorta; Grade IV, measuring > 5mm in size.  Shunts: There is no evidence of an atrial septal defect.  Laboratory Data: High Sensitivity Troponin:   Recent Labs  Lab 08/26/24 0408 08/26/24 0608  TROPONINIHS 61* 55*     Chemistry Recent Labs  Lab 08/26/24 0408  NA 138  K 4.2  CL 101  CO2 28  GLUCOSE 295*  BUN 19  CREATININE 1.29*  CALCIUM  8.5*  GFRNONAA 58*  ANIONGAP 9    Recent Labs  Lab 08/26/24 0408  PROT 5.9*  ALBUMIN  3.2*  AST 18  ALT 11  ALKPHOS 80  BILITOT 0.6   Lipids  Recent Labs  Lab 08/26/24 0408  CHOL 202*  TRIG 112  HDL 32*  LDLCALC 148*  CHOLHDL 6.3    Hematology Recent Labs  Lab 08/26/24 0408  WBC 10.5  RBC 5.18  HGB 14.8  HCT 46.4  MCV 89.6  MCH 28.6  MCHC 31.9  RDW 12.2  PLT 150   Thyroid  No results for input(s): TSH, FREET4 in the last 168 hours.  BNP Recent Labs  Lab 08/26/24 0408  BNP 88.3    DDimer No results for input(s): DDIMER in the last 168 hours.  Radiology/Studies:  DG Chest Port 1 View Result Date: 08/26/2024 EXAM: 1 VIEW(S) XRAY OF THE CHEST 08/26/2024 04:25:00 AM COMPARISON: AP radiograph of the chest dated 02/10/2023. CLINICAL HISTORY: chest pain FINDINGS: LUNGS AND PLEURA: There are mildly prominent interstitial opacities. No pleural effusion. No pneumothorax. HEART AND MEDIASTINUM: No acute abnormality of the cardiac and mediastinal silhouettes. BONES AND SOFT TISSUES: No  acute osseous abnormality. IMPRESSION: 1. Mildly prominent interstitial opacities. 2. Status post CABG. Electronically signed by: Evalene Coho MD 08/26/2024 04:29 AM EST RP Workstation: [id number]   Assessment and Plan:  Chest pain Mildly elevated troponin Presented via EMS for sudden onset chest pain Described chest pain as burning sensation Reports it started in his chest and radiated to his stomach Troponin 61 ? 55 (noted to be in the 60s May 2024 without chest pain) EKG showed sinus rhythm, ST depressions noted in V4-V5 Loaded with aspirin , sublingual nitroglycerin  x 2 via EMS Reported no relief with NTG Denies any active chest pain Denies any recent anginal symptoms with exertion or at rest Pending updated echocardiogram, will follow results  Continue ASA 81 mg daily  Suspect patient may need ischemic evaluation given risk factors, history of known CAD with possible progression of disease. Will discuss with MD   Coronary artery disease s/p CABG x 4 in 2022 Hyperlipidemia, goal LDL < 70 08/26/2024: ALT 11; HDL 32; LDL Cholesterol 148  LIMA-LAD, SVG-PDA, SVG-first diagonal, SVG-OM in 2022  Previously on Crestor  40 mg daily, Imdur  30 mg daily, Lopressor  25 mg twice daily, aspirin  81 mg daily Dispense report shows poor medication compliance lately LDL was 53 in 2023 when taking medications regularly  [facility] 06/2022: patent LM, total occlusion LCX, severe stenosis pLAD, patent RCA stent with mild in-stent restenosis, patent LIMA-LAD, total occlusion of SVG conduits  Continue aspirin  81 mg daily Continue Crestor  40 mg daily, may need to increase or consider additional agents if LDL remains elevated on this dose  Chronic HFmrEF Hypertension Sinus bradycardia Previously on Lopressor  25 mg twice daily, losartan  25 mg daily Dispense report shows poor medication compliance EF from TEE December 2022 showed EF 45 to 50% BNP normal Appears euvolemic on exam [facility] 06/2022: normal right  hear pressures, normal wedge pressure  Pending updated echocardiogram Would hold beta-blocker in setting of bradycardia   Medication noncompliance Upon review of dispense report it appears most medications have not been filled recently Select few were filled in May 2025 for 90-day supply Him and his wife report no missed doses I attempted to reach out to son, Barry [name], who is typically involved in the patient's healthcare however there was no answer  Discussed the importance of medication compliance moving forward   Per primary CKD 3a History of CVA Diabetes  Risk Assessment/Risk Scores:    TIMI Risk Score for Unstable Angina or Non-ST Elevation MI:   The patient's TIMI risk score is 5,  which indicates a 26% risk of all cause mortality, new or recurrent myocardial infarction or need for urgent revascularization in the next 14 days.  [place]  Heart Association (NYHA) Functional Class NYHA Class I   For questions or updates, please contact [facility] Please consult [email or web address] for contact info under   Signed, Waddell DELENA Donath, PA-C  08/26/2024 1:25 PM

## 2024-08-26 NOTE — ED Notes (Signed)
 Assuming pt care, pt bib ems coming from home for chest pain that woke him from sleep. Pt aaox4, pt lethargic can ambulate with assistance, skin warm/dry. Pt reports med hx. Cardiac hx. Stents, DM. Pt speaks Khami, son at bedside,denies complaints at this time, call bell within reach

## 2024-08-26 NOTE — ED Triage Notes (Signed)
 Pt coming from home via ems for evaluation of chest pain that woke the pt out of his sleep rated 7/10 initially;  1 nitroglycerine at home and 2  niotroglycerine per ems, 324 aspirin  with ems, sinus brady, no apparent distress

## 2024-08-26 NOTE — Progress Notes (Signed)
Patient and family declined interpreter services

## 2024-08-26 NOTE — ED Provider Notes (Signed)
 [place] EMERGENCY DEPARTMENT AT [facility] Provider Note   CSN: [id number] Arrival date & time: 08/26/24  9662     Patient presents with: Chest Pain   Barry [name] is a 73 y.o. male.   Brought to the emerged department by ambulance from home.  Patient was awakened from sleep by pain in his chest.  Patient describes it as a burning pain across the chest.  This is similar to what he has had in the past.  Patient has had aspirin  325 mg and a total of 3 nitroglycerin  tablets.  Initial pain was 7 out of 10, now 4 out of 10.       Prior to Admission medications   Medication Sig Start Date End Date Taking? Authorizing Provider  Continuous Glucose Sensor (DEXCOM G7 SENSOR) MISC Apply every 10 days for blood sugar monitoring 02/07/24   Copland, Harlene BROCKS, MD  dapagliflozin  propanediol (FARXIGA ) 5 MG TABS tablet Take 1 tablet (5 mg total) by mouth daily before breakfast. 02/07/24   Copland, Jessica C, MD  insulin  glargine (LANTUS  SOLOSTAR) 100 UNIT/ML Solostar Pen Inject 27-30 Units into the skin daily. 05/31/24   Copland, Harlene BROCKS, MD  Insulin  Pen Needle (PEN NEEDLES) 30G X 5 MM MISC Use daily with insulin  02/07/24   Copland, Jessica C, MD  isosorbide  mononitrate (IMDUR ) 30 MG 24 hr tablet Take 1 tablet (30 mg total) by mouth daily. You can take an additional half tablet as needed for chest pain 10/07/22   Wyn Jackee VEAR Mickey., NP  Lancets [facility] DELICA PLUS [place]) MISC Check blood sugars twice daily 04/11/24   Copland, Harlene BROCKS, MD  losartan  (COZAAR ) 25 MG tablet Take 1 tablet (25 mg total) by mouth daily. 02/07/24   Copland, Harlene BROCKS, MD  metoprolol  tartrate (LOPRESSOR ) 25 MG tablet Take 1 tablet (25 mg total) by mouth 2 (two) times daily. 10/12/21   Walker, Caitlin S, NP  nitroGLYCERIN  (NITROSTAT ) 0.4 MG SL tablet Place 1 tablet (0.4 mg total) under the tongue every 5 (five) minutes as needed for chest pain. 07/28/22   Copland, Harlene BROCKS, MD  rosuvastatin  (CRESTOR ) 40 MG tablet  Take 1 tablet (40 mg total) by mouth daily. 02/07/24   Copland, Harlene BROCKS, MD    Allergies: Metformin  and related    Review of Systems  Updated Vital Signs BP (!) 158/84   Pulse (!) 48   Temp 97.7 F (36.5 C) (Oral)   Resp 14   Ht 5' 5 (1.651 m)   Wt 74.8 kg   SpO2 96%   BMI 27.46 kg/m   Physical Exam Vitals and nursing note reviewed.  Constitutional:      General: He is not in acute distress.    Appearance: He is well-developed.  HENT:     Head: Normocephalic and atraumatic.     Mouth/Throat:     Mouth: Mucous membranes are moist.  Eyes:     General: Vision grossly intact. Gaze aligned appropriately.     Extraocular Movements: Extraocular movements intact.     Conjunctiva/sclera: Conjunctivae normal.  Cardiovascular:     Rate and Rhythm: Normal rate and regular rhythm.     Pulses: Normal pulses.     Heart sounds: Normal heart sounds, S1 normal and S2 normal. No murmur heard.    No friction rub. No gallop.  Pulmonary:     Effort: Pulmonary effort is normal. No respiratory distress.     Breath sounds: Normal breath sounds.  Abdominal:  Palpations: Abdomen is soft.     Tenderness: There is no abdominal tenderness. There is no guarding or rebound.     Hernia: No hernia is present.  Musculoskeletal:        General: No swelling.     Cervical back: Full passive range of motion without pain, normal range of motion and neck supple. No pain with movement, spinous process tenderness or muscular tenderness. Normal range of motion.     Right lower leg: No edema.     Left lower leg: No edema.  Skin:    General: Skin is warm and dry.     Capillary Refill: Capillary refill takes less than 2 seconds.     Findings: No ecchymosis, erythema, lesion or wound.  Neurological:     Mental Status: He is alert and oriented to person, place, and time.     GCS: GCS eye subscore is 4. GCS verbal subscore is 5. GCS motor subscore is 6.     Cranial Nerves: Cranial nerves 2-12 are intact.      Sensory: Sensation is intact.     Motor: Motor function is intact. No weakness or abnormal muscle tone.     Coordination: Coordination is intact.  Psychiatric:        Mood and Affect: Mood normal.        Speech: Speech normal.        Behavior: Behavior normal.     (all labs ordered are listed, but only abnormal results are displayed) Labs Reviewed  CBC WITH DIFFERENTIAL/PLATELET - Abnormal; Notable for the following components:      Result Value   Neutro Abs 8.3 (*)    All other components within normal limits  COMPREHENSIVE METABOLIC PANEL WITH GFR - Abnormal; Notable for the following components:   Glucose, Bld 295 (*)    Creatinine, Ser 1.29 (*)    Calcium  8.5 (*)    Total Protein 5.9 (*)    Albumin  3.2 (*)    GFR, Estimated 58 (*)    All other components within normal limits  TROPONIN I (HIGH SENSITIVITY) - Abnormal; Notable for the following components:   Troponin I (High Sensitivity) 61 (*)    All other components within normal limits  LIPASE, BLOOD    EKG: EKG Interpretation Date/Time:  Monday August 26 2024 03:51:29 EST Ventricular Rate:  52 PR Interval:  189 QRS Duration:  124 QT Interval:  456 QTC Calculation: 425 R Axis:   97  Text Interpretation: Sinus rhythm Nonspecific intraventricular conduction delay Probable anteroseptal infarct, old Repol abnrm suggests ischemia, anterolateral Confirmed by Haze Lonni PARAS [phone]) on 08/26/2024 3:56:04 AM  Radiology: [facility] Chest Port 1 View Result Date: 08/26/2024 EXAM: 1 VIEW(S) XRAY OF THE CHEST 08/26/2024 04:25:00 AM COMPARISON: AP radiograph of the chest dated 02/10/2023. CLINICAL HISTORY: chest pain FINDINGS: LUNGS AND PLEURA: There are mildly prominent interstitial opacities. No pleural effusion. No pneumothorax. HEART AND MEDIASTINUM: No acute abnormality of the cardiac and mediastinal silhouettes. BONES AND SOFT TISSUES: No acute osseous abnormality. IMPRESSION: 1. Mildly prominent interstitial opacities.  2. Status post CABG. Electronically signed by: Evalene Coho MD 08/26/2024 04:29 AM EST RP Workstation: [id number]     Procedures   Medications Ordered in the ED - No data to display                                  Medical Decision Making Amount and/or Complexity of  Data Reviewed Independent Historian: spouse and EMS    Details: Could not get Khmer interpreter, wife did translate External Data Reviewed: ECG and notes.    Details: CABG 2022: LIMA to LAD, saphenous vein graft to PDA, saphenous vein graft first diagonal, and saphenous vein graft to obtuse marginal  Cath 07/11/2022: 1.  Multivessel coronary artery disease with patency of the left main, total occlusion of the left circumflex, severe stenosis of the proximal LAD, and patency of the RCA with mild in-stent restenosis and mild to moderate nonobstructive plaquing in the mid vessel. 2.  Status post aortocoronary bypass surgery with continued patency of the LIMA to LAD and total occlusion of the saphenous vein graft conduits  Labs: ordered. Radiology: ordered.    Patient presents with chest pain.  Patient has significant cardiac history.  Patient with multiple stents years ago.  In 2022, patient presented to cardiology with decreased exercise tolerance and burning symptoms similar to those today.  Heart catheterization at that time showed diffuse disease and patient went on to have multivessel CABG.  Patient with heart cath in 2023 showing patent LIMA but remainder of saphenous vein grafts were occluded.  Patient presumably medical management currently.  Patient had 7 out of 10 pain tonight.  He has slowly improved with nitroglycerin , has minimal residual pain currently.  EKG unchanged from prior.  First troponin elevated at 61, however this appears to be his baseline.  Patient seen in May of last year for a fall and his troponins were in the 60s at that time without chest pain.  Overall, workup [place] far reassuring but patient  is very high risk with significant CAD.  Will recommend hospitalization for further management.     Final diagnoses:  Chest pain, unspecified type    ED Discharge Orders     None          Haze Lonni PARAS, MD 08/26/24 [phone]

## 2024-08-26 NOTE — Plan of Care (Signed)
  Problem: Education: Goal: Ability to describe self-care measures that may prevent or decrease complications (Diabetes Survival Skills Education) will improve Outcome: Progressing   Problem: Coping: Goal: Ability to adjust to condition or change in health will improve Outcome: Progressing   Problem: Nutritional: Goal: Maintenance of adequate nutrition will improve Outcome: Progressing   Problem: Skin Integrity: Goal: Risk for impaired skin integrity will decrease Outcome: Progressing   

## 2024-08-26 NOTE — Progress Notes (Signed)
  Echocardiogram 2D Echocardiogram has been performed.  Barry [name] 08/26/2024, 2:55 PM

## 2024-08-26 NOTE — Consult Note (Signed)
 Cardiology Consultation  Patient ID: Barry [name] MRN: [id number]; DOB: 1951/03/14  Admit date: 08/26/2024 Date of Consult: 08/26/2024  PCP:  Barry Harlene BROCKS, MD   [facility] Providers Cardiologist:  Barry Fell, MD  Cardiology APP:  Barry Glendia DASEN, PA-C    Patient Profile: Barry [name] is a 73 y.o. male with a hx of CAD s/p CABG x 4 (LIMA-LAD, SVG-PDA, SVG-first diagonal, SVG-OM) in 2022, hyperlipidemia, chronic CHF with mildly reduced ejection fraction, chronic SMA dissection, PAD with penetrating ulcer in the left common iliac artery, history of stroke in 2011, diabetes, CKD 3a, hypertension, AAA who is being seen 08/26/2024 for the evaluation of chest pain at the request of Dr. Claudene.  History of Present Illness: Barry [name] has past medical history as listed above.  He presented to the [facility] emergency department on 08/26/2024 via EMS after being awakened from sleep with chest pain.  Patient described it as a burning pain across his chest, similar to chest pain is felt in the past.  Relevant workup on the ED includes: EKG shows sinus rhythm, HR 52, new ST depressions in leads V4-V5, CBC unremarkable, CMP showed creatinine 1.29 (around baseline) otherwise unremarkable, lipase negative, lipid panel showed LDL 148, troponin 61 ? 55, BNP normal at 88, CXR showed interstitial opacities.   While in the ED patient has been hypertensive with SBP 140s to 150s and bradycardic with HR 40s to 50s.  He was loaded with 324 mg of aspirin  by EMS.  He was admitted to the medicine service.  Cardiology was asked to consult in the setting of chest pain.  He is a patient of Dr. Fell.  Was last seen by Barry [name] February 2024 for preoperative evaluation.  At this appointment he reported having no chest discomfort, tolerating all of his medications well.  He noted that his biggest complaint was constipation.  His medication regimen at this time included: Aspirin  81 mg daily, Farxiga  5 mg  daily, Imdur  30 mg daily, lisinopril  10 mg daily, Lopressor  25 mg twice daily, Crestor  40 mg daily, as needed sublingual nitroglycerin .  He was supposed to follow-up with our practice in 6 months, lost to follow-up.  Upon review of his dispense report it appears that most of his medications have no recent fill dates, very few last being filled May 2025 for 90-day supply.  He underwent right/left heart cath 07/01/2022 that showed: Multivessel CAD with patent LM, total occlusion of LCx, severe stenosis of proximal LAD, patent RCA with mild in-stent restenosis and mild to moderate nonobstructive plaque in the mid vessel.  S/p CABG with continued patency of LIMA to LAD, total occlusion of SVG conduits.  Normal right heart pressures.  After speaking to the patient and his wife who is present bedside, they agree with the history. They say that around 1 AM he was waking up with burning chest pain that radiated to his abdomen. He took SL NTG at home and this provided no relief. He reports no chest pain at the moment. Says he feels completely asymptomatic at this time. He denies any recent anginal symptoms with exertion or at rest. They both agree this was the first and only episode. They tell me that he has not missed any doses of his medications. Upon review of his dispense report it shows that many of them have not been filled recently and only a few were filled in 01/2024 with 90d supply. They continue to state he has not missed any doses.  Prior notes indicate that the patients son is typically the one to contact for medical history. I attempted to call the number listed for his son, Barry [name], however there was no answer.   Past Medical History:  Diagnosis Date   Anginal pain    Arthritis    CAD (coronary artery disease)    LHC (02/26/14):  >> PCI:  Xience DES to pRCA // LHC (03/10/14):  >> PCI:  Xience DES to [facility] // Myoview  2/16: low risk // Cath 2016: patent stents; stable dz - med Rx // Myoview  10/22:  anterolateral ischemia, inferoapical infarct w peri infarct ischemia; EF 41; High Risk // Cath 12/22: LCx stent occl, severe LAD and Dx dz, diffuse RCA disease >> CABG   Carotid stenosis    a. Carotid US  (02/27/14):  Bilateral ICA 1-39%   CKD (chronic kidney disease) stage 3    Depression    Diabetes mellitus    Dissection of mesenteric artery    a. Mesenteric Artery Duplex (7/15):  pSMA chronic dissection with aneurysmal dilation of 1.29 cm (VVS);  b.  Chest CTA (02/26/14):  IMPRESSION:  1. No aortic  dissection or other acute abnormality.  2. 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  3. Stable 17 mm ectasia of the left common iliac artery.  4. Atherosclerosis, including aortoiliac and coronary artery disease.    HFmrEF (heart failure with mildly reduced EF) 09/2015   Echo 6/15: EF 50-55 // Echo 1/17: mod LVH, EF 55%, inf-lat HK, Gr 1 DD, mild LAE // Echocardiogram 10/22: EF 40-45, Gr 1 DD, mildly reduced RVSF, RVSP 26.6, trivial MR, trivial AI, post and ant-lat HK   History of kidney stones    Hyperlipidemia    Hypertension    Iliac artery aneurysm, left    Myocardial infarction (HCC)    PVC's (premature ventricular contractions) 07/21/2021   Monitor 10/22:  NSR avg HR 71; no AF/Flutter; no high grade HB or pathologic pauses PVC burden 20%, rare supraventricular beats w/o sustained arrhythmia    Stroke (HCC) 09/26/2009   Past Surgical History:  Procedure Laterality Date   CARDIAC CATHETERIZATION     CARDIAC CATHETERIZATION N/A 06/25/2015   Procedure: Left Heart Cath and Coronary Angiography;  Surgeon: Lonni JONETTA Cash, MD;  Location: [facility] INVASIVE CV LAB;  Service: Cardiovascular;  Laterality: N/A;   COLONOSCOPY  2016   DJ-MAC-moviprep (adeq)-TA-17yr recall   COLONOSCOPY WITH PROPOFOL  N/A 07/23/2015   Procedure: COLONOSCOPY WITH PROPOFOL ;  Surgeon: Toribio SHAUNNA Cedar, MD;  Location: [facility] ENDOSCOPY;  Service: Endoscopy;  Laterality: N/A;   CORONARY ANGIOPLASTY     CORONARY ARTERY  BYPASS GRAFT N/A 09/21/2021   Procedure: CORONARY ARTERY BYPASS GRAFTING (CABG)  X FOUR, ON PUMP, USING LEFT INTERNAL MAMMARY ARTERY AND RIGHT ENDOSCOPIC GREATER SAPHENOUS VEIN CONDUITS;  Surgeon: Shyrl Linnie KIDD, MD;  Location: [facility] OR;  Service: Open Heart Surgery;  Laterality: N/A;   CYSTOSCOPY/URETEROSCOPY/HOLMIUM LASER/STENT PLACEMENT Right 03/28/2023   Procedure: CYSTOSCOPY, RIGHT RETROGRADE PYELOGRAM, DIAGNOSTIC RIGHT URETEROSCOPY;  Surgeon: Elisabeth Valli JONETTA, MD;  Location: [facility] ORS;  Service: Urology;  Laterality: Right;   ENDOVEIN HARVEST OF GREATER SAPHENOUS VEIN  09/21/2021   Procedure: ENDOVEIN HARVEST OF GREATER SAPHENOUS VEIN;  Surgeon: Shyrl Linnie KIDD, MD;  Location: [facility] OR;  Service: Open Heart Surgery;;   EXTRACORPOREAL SHOCK WAVE LITHOTRIPSY Right 11/21/2022   Procedure: EXTRACORPOREAL SHOCK WAVE LITHOTRIPSY (ESWL);  Surgeon: Devere Lonni Righter, MD;  Location: [facility];  Service: Urology;  Laterality: Right;   LEFT  HEART CATH AND CORONARY ANGIOGRAPHY N/A 08/31/2021   Procedure: LEFT HEART CATH AND CORONARY ANGIOGRAPHY;  Surgeon: Barry [name] Victory ORN, MD;  Location: [facility] INVASIVE CV LAB;  Service: Cardiovascular;  Laterality: N/A;   LEFT HEART CATHETERIZATION WITH CORONARY ANGIOGRAM N/A 02/26/2014   Procedure: LEFT HEART CATHETERIZATION WITH CORONARY ANGIOGRAM;  Surgeon: Deatrice DELENA Cage, MD;  Location: [facility] CATH LAB;  Service: Cardiovascular;  Laterality: N/A;   LEFT HEART CATHETERIZATION WITH CORONARY ANGIOGRAM N/A 03/10/2014   Procedure: LEFT HEART CATHETERIZATION WITH CORONARY ANGIOGRAM;  Surgeon: Candyce GORMAN Reek, MD;  Location[facility] CATH LAB;  Service: Cardiovascular;  Laterality: N/A;   POLYPECTOMY  2016   TA   RIGHT HEART CATH AND CORONARY/GRAFT ANGIOGRAPHY N/A 07/01/2022   Procedure: RIGHT HEART CATH AND CORONARY/GRAFT ANGIOGRAPHY;  Surgeon: Wonda Sharper, MD;  Location: [facility] INVASIVE CV LAB;  Service: Cardiovascular;  Laterality: N/A;   TEE WITHOUT  CARDIOVERSION N/A 09/21/2021   Procedure: TRANSESOPHAGEAL ECHOCARDIOGRAM (TEE);  Surgeon: Shyrl Linnie KIDD, MD;  Location: [facility] OR;  Service: Open Heart Surgery;  Laterality: N/A;    Home Medications:  Prior to Admission medications   Medication Sig Start Date End Date Taking? Authorizing Provider  dapagliflozin  propanediol (FARXIGA ) 5 MG TABS tablet Take 1 tablet (5 mg total) by mouth daily before breakfast. 02/07/24   Copland, Jessica C, MD  insulin  glargine (LANTUS  SOLOSTAR) 100 UNIT/ML Solostar Pen Inject 27-30 Units into the skin daily. 05/31/24   Copland, Harlene BROCKS, MD  isosorbide  mononitrate (IMDUR ) 30 MG 24 hr tablet Take 1 tablet (30 mg total) by mouth daily. You can take an additional half tablet as needed for chest pain 10/07/22   Wyn Jackee VEAR Mickey., [name]  losartan  (COZAAR ) 25 MG tablet Take 1 tablet (25 mg total) by mouth daily. 02/07/24   Copland, Harlene BROCKS, MD  metoprolol  tartrate (LOPRESSOR ) 25 MG tablet Take 1 tablet (25 mg total) by mouth 2 (two) times daily. 10/12/21   Walker, Caitlin S, [name]  nitroGLYCERIN  (NITROSTAT ) 0.4 MG SL tablet Place 1 tablet (0.4 mg total) under the tongue every 5 (five) minutes as needed for chest pain. 07/28/22   Copland, Harlene BROCKS, MD  rosuvastatin  (CRESTOR ) 40 MG tablet Take 1 tablet (40 mg total) by mouth daily. 02/07/24   Copland, Jessica C, MD    Scheduled Meds:  [START ON 08/27/2024] aspirin  EC  81 mg Oral Daily   enoxaparin  (LOVENOX ) injection  40 mg Subcutaneous Q24H   insulin  aspart  0-15 Units Subcutaneous TID WC   insulin  aspart  0-5 Units Subcutaneous QHS   insulin  glargine-yfgn  32 Units Subcutaneous Daily   rosuvastatin   40 mg Oral QPM   Continuous Infusions:  PRN Meds: acetaminophen , alum & mag hydroxide-simeth, hydrALAZINE , nitroGLYCERIN , ondansetron  (ZOFRAN ) IV  Allergies:    Allergies  Allergen Reactions   Metformin  And Related Other (See Comments)    Pt feels that this causes constipation and will not take    Social History:    Social History   Socioeconomic History   Marital status: Married    Spouse name: Not on file   Number of children: 3   Years of education: Not on file   Highest education level: Not on file  Occupational History   Occupation: Retired  Tobacco Use   Smoking status: Former    Current packs/day: 0.00    Average packs/day: 0.3 packs/day for 38.0 years (9.5 ttl pk-yrs)    Types: Cigarettes    Start date: 39    Quit date:  2008    Years since quitting: 17.9   Smokeless tobacco: Never  Vaping Use   Vaping status: Never Used  Substance and Sexual Activity   Alcohol use: No    Alcohol/week: 0.0 standard drinks of alcohol   Drug use: No   Sexual activity: Yes  Other Topics Concern   Not on file  Social History Narrative   Marital: married.      Lives: with son,wife.       Children: 3 children; 6 grandchildren      Employed: unemployed; disability unknown reason/CVA L sided weakness      Tobacco:  Quit 2012; smoked 40 years.       Alcohol: no drinking now; social in past.       Drugs: none       Exercise: sporadic.       ADLs:  No driving since CVA.   Social Drivers of Corporate Investment Banker Strain: Low Risk  (05/28/2021)   Overall Financial Resource Strain (CARDIA)    Difficulty of Paying Living Expenses: Not very hard  Food Insecurity: Food Insecurity Present (11/12/2021)   Hunger Vital Sign    Worried About Running Out of Food in the Last Year: Sometimes true    Ran Out of Food in the Last Year: Never true  Transportation Needs: No Transportation Needs (06/07/2021)   PRAPARE - Administrator, Civil Service (Medical): No    Lack of Transportation (Non-Medical): No  Physical Activity: Inactive (11/12/2021)   Exercise Vital Sign    Days of Exercise per Week: 0 days    Minutes of Exercise per Session: 0 min  Stress: Not on file  Social Connections: Not on file  Intimate Partner Violence: Not At Risk (11/12/2021)   Humiliation, Afraid, Rape, and Kick  questionnaire    Fear of Current or Ex-Partner: No    Emotionally Abused: No    Physically Abused: No    Sexually Abused: No    Family History:   Family History  Problem Relation Age of Onset   Diabetes Father    Hypertension Father    Heart attack Father    Stroke Neg Hx     ROS:  Please see the history of present illness.  All other ROS reviewed and negative.     Physical Exam/Data: Vitals:   08/26/24 0824 08/26/24 1124 08/26/24 1145 08/26/24 1212  BP: (!) 153/87 (!) 154/90 (!) 149/87 (!) 153/89  Pulse: (!) 57 (!) 55 (!) 59 (!) 52  Resp: 14 15 18 17   Temp:  98.3 F (36.8 C)  98.7 F (37.1 C)  TempSrc:  Oral  Oral  SpO2: 100% 100% 99% 100%  Weight:      Height:       No intake or output data in the 24 hours ending 08/26/24 1325    08/26/2024    3:53 AM 04/08/2024   10:34 AM 02/07/2024    1:04 PM  Last 3 Weights  Weight (lbs) 165 lb 163 lb 164 lb  Weight (kg) 74.844 kg 73.936 kg 74.39 kg     Body mass index is 27.46 kg/m.  General:  in no acute distress, laying flat without issue HEENT: normal Neck: no JVD Vascular: Distal pulses 2+ bilaterally Cardiac:  normal S1, S2; bradycardic Lungs:  clear to auscultation bilaterally Abd: soft, nontender Ext: no edema Musculoskeletal:  No deformities Skin: warm and dry  Neuro: no focal abnormalities noted Psych:  Normal affect   EKG:  The EKG was personally reviewed and demonstrates:  sinus bradycardia, HR 50s, PACs  Telemetry:  Telemetry was personally reviewed and demonstrates:  sinus rhythm, HR 52, new ST depressions in leads V4-V5  Relevant CV Studies:  Echocardiogram, 08/26/2024 Ordered, pending results  Right/left heart cath, 07/01/2022 Multivessel coronary artery disease with patency of the left main, total occlusion of the left circumflex, severe stenosis of the proximal LAD, and patency of the RCA with mild in-stent restenosis and mild to moderate nonobstructive plaquing in the mid vessel. Status post  aortocoronary bypass surgery with continued patency of the LIMA to LAD and total occlusion of the saphenous vein graft conduits Normal right heart pressures including normal wedge pressure   Recommendations: Medical therapy  Long-term monitor, 06/07/2022 Patient had a min HR of 39 bpm, max HR of 88 bpm, and avg HR of 55 bpm. Predominant underlying rhythm was Sinus Rhythm.  Isolated SVEs were rare (<1.0%), SVE Couplets were rare (<1.0%), and no SVE Triplets were present. Isolated VEs were rare (<1.0%), and no VE Couplets or VE Triplets were present. Ventricular Bigeminy and Trigeminy were present.    Summary: The basic rhythm is sinus bradycardia with an average heart rate of 55 bpm.  There are isolated ectopics with a low burden of both supraventricular and ventricular ectopics of less than 1%.  There were no sustained arrhythmias.  There is no atrial fibrillation or flutter.  There is no high-grade AV block.   Transesophageal echocardiogram, 09/21/2021 Left Ventricle: The left ventricle has mild- moderately reduced systolic function, with an ejection fraction of 40- 45% . The cavity size was normal. Concentric left ventricular hypertrophy.  Right Ventricle: The right ventricle has normal systolic function. The cavity was normal. There is no increase in right ventricular wall thickness.  Left Atrium: Left atrial size was dilated. No left atrial/ left atrial appendage thrombus was detected. The left atrial appendage is well visualized and there is no evidence of thrombus present.  Right Atrium: Right atrial size was normal in size.  Interatrial Septum: No atrial level shunt detected by color flow Doppler. There is no evidence of a patent foramen ovale.  Pericardium: There is no evidence of pericardial effusion.  Mitral Valve: The mitral valve is normal in structure. Mitral valve regurgitation is mild by color flow Doppler. The MR jet is centrally- directed. There is no evidence of mitral valve  vegetation.  Tricuspid Valve: The tricuspid valve was normal in structure. Tricuspid valve regurgitation is moderate by color flow Doppler.  Aortic Valve: The aortic valve is tricuspid Aortic valve regurgitation is trivial by color flow Doppler. There is no stenosis of the aortic valve.  Pulmonic Valve: The pulmonic valve was normal in structure. Pulmonic valve regurgitation is trivial by color flow Doppler.  Aorta: The aortic root and ascending aorta are normal in size and structure. There is evidence of plaque in the descending aorta; Grade IV, measuring > 5mm in size.  Shunts: There is no evidence of an atrial septal defect.  Laboratory Data: High Sensitivity Troponin:   Recent Labs  Lab 08/26/24 0408 08/26/24 0608  TROPONINIHS 61* 55*     Chemistry Recent Labs  Lab 08/26/24 0408  NA 138  K 4.2  CL 101  CO2 28  GLUCOSE 295*  BUN 19  CREATININE 1.29*  CALCIUM  8.5*  GFRNONAA 58*  ANIONGAP 9    Recent Labs  Lab 08/26/24 0408  PROT 5.9*  ALBUMIN  3.2*  AST 18  ALT 11  ALKPHOS 80  BILITOT 0.6   Lipids  Recent Labs  Lab 08/26/24 0408  CHOL 202*  TRIG 112  HDL 32*  LDLCALC 148*  CHOLHDL 6.3    Hematology Recent Labs  Lab 08/26/24 0408  WBC 10.5  RBC 5.18  HGB 14.8  HCT 46.4  MCV 89.6  MCH 28.6  MCHC 31.9  RDW 12.2  PLT 150   Thyroid  No results for input(s): TSH, FREET4 in the last 168 hours.  BNP Recent Labs  Lab 08/26/24 0408  BNP 88.3    DDimer No results for input(s): DDIMER in the last 168 hours.  Radiology/Studies:  DG Chest Port 1 View Result Date: 08/26/2024 EXAM: 1 VIEW(S) XRAY OF THE CHEST 08/26/2024 04:25:00 AM COMPARISON: AP radiograph of the chest dated 02/10/2023. CLINICAL HISTORY: chest pain FINDINGS: LUNGS AND PLEURA: There are mildly prominent interstitial opacities. No pleural effusion. No pneumothorax. HEART AND MEDIASTINUM: No acute abnormality of the cardiac and mediastinal silhouettes. BONES AND SOFT TISSUES: No  acute osseous abnormality. IMPRESSION: 1. Mildly prominent interstitial opacities. 2. Status post CABG. Electronically signed by: Evalene Coho MD 08/26/2024 04:29 AM EST RP Workstation: [id number]   Assessment and Plan:  Chest pain Mildly elevated troponin Presented via EMS for sudden onset chest pain Described chest pain as burning sensation Reports it started in his chest and radiated to his stomach Troponin 61 ? 55 (noted to be in the 60s May 2024 without chest pain) EKG showed sinus rhythm, ST depressions noted in V4-V5 Loaded with aspirin , sublingual nitroglycerin  x 2 via EMS Reported no relief with NTG Denies any active chest pain Denies any recent anginal symptoms with exertion or at rest Pending updated echocardiogram, will follow results  Continue ASA 81 mg daily  Suspect patient may need ischemic evaluation given risk factors, history of known CAD with possible progression of disease. Will discuss with MD   Coronary artery disease s/p CABG x 4 in 2022 Hyperlipidemia, goal LDL < 70 08/26/2024: ALT 11; HDL 32; LDL Cholesterol 148  LIMA-LAD, SVG-PDA, SVG-first diagonal, SVG-OM in 2022  Previously on Crestor  40 mg daily, Imdur  30 mg daily, Lopressor  25 mg twice daily, aspirin  81 mg daily Dispense report shows poor medication compliance lately LDL was 53 in 2023 when taking medications regularly  [facility] 06/2022: patent LM, total occlusion LCX, severe stenosis pLAD, patent RCA stent with mild in-stent restenosis, patent LIMA-LAD, total occlusion of SVG conduits  Continue aspirin  81 mg daily Continue Crestor  40 mg daily, may need to increase or consider additional agents if LDL remains elevated on this dose  Chronic HFmrEF Hypertension Sinus bradycardia Previously on Lopressor  25 mg twice daily, losartan  25 mg daily Dispense report shows poor medication compliance EF from TEE December 2022 showed EF 45 to 50% BNP normal Appears euvolemic on exam [facility] 06/2022: normal right  hear pressures, normal wedge pressure  Pending updated echocardiogram Would hold beta-blocker in setting of bradycardia   Medication noncompliance Upon review of dispense report it appears most medications have not been filled recently Select few were filled in May 2025 for 90-day supply Him and his wife report no missed doses I attempted to reach out to son, Barry [name], who is typically involved in the patient's healthcare however there was no answer  Discussed the importance of medication compliance moving forward   Per primary CKD 3a History of CVA Diabetes  Risk Assessment/Risk Scores:    TIMI Risk Score for Unstable Angina or Non-ST Elevation MI:   The patient's TIMI risk score is 5,  which indicates a 26% risk of all cause mortality, new or recurrent myocardial infarction or need for urgent revascularization in the next 14 days.  [place]  Heart Association (NYHA) Functional Class NYHA Class I   For questions or updates, please contact [facility] Please consult [email or web address] for contact info under   Signed, Waddell DELENA Donath, PA-C  08/26/2024 1:25 PM

## 2024-08-26 NOTE — H&P (Addendum)
 History and Physical    Patient: Barry [name] FMW:[id number] DOB: [birth year] DOA: 08/26/2024 DOS: the patient was seen and examined on 08/26/2024 PCP: Copland, Harlene BROCKS, MD  Patient coming from: Home  Chief Complaint:  Chief Complaint  Patient presents with   Chest Pain   HPI: Barry [name] is a 73 y.o. khmer speaking male with medical history significant of hypertension, hyperlipidemia, CAD s/p PCI 2015 with CABG x 4v 2022, heart failure with mildly reduced ejection fraction, TIA/CVA, diabetes mellitus type 2, and mesenteric artery dissection  presents with chest discomfort and numbness in the limbs.  Son is present at bedside who requested to act as interpreter.  History does seem to be limited due to language barrier.  He experiences a sensation of heat in his chest, which he describes as feeling abnormal and waking him from sleep. No typical chest pain, but associated symptoms include dizziness, nausea, and excessive sweating. No vomiting occurred, but he felt as though he might.  He reports numbness and tingling in his arms and legs, which he associates possibly with a previous stroke. The numbness is not complete, as he still retains some sensation, but it is described as a 'surge of numbness' or tingling. This sensation is present in his hands and feet, particularly on the left side.  He has a history of diabetes and is currently on insulin  therapy. He uses two types of insulin : a long-acting insulin  at a dose of 32 units and a short-acting insulin  at a dose of 12 units. His blood sugar levels range from 200 to 300 mg/dL. He has stopped taking medications for blood pressure and cholesterol due to side effects such as constipation and bloating.  He has a significant smoking history, having smoked from age 37 until 2008, totaling approximately 42 years of smoking. He denies current smoking or alcohol use. He reports feeling more fatigued recently, particularly when engaging in activities such as  gardening.  Review of records note last cardiac cath in 06/2022 noted multivessel coronary artery disease with patency of the left main, total occlusion of the left circumflex, severe stenosis of the proximal LAD, patency of the RCA with mild in-stent restenosis, mild to moderate nonobstructive plaque in the mid vessel,  continued patency of the LIMA to LAD, and total occlusion of the saphenous vein graft conduits.  In route with EMS patient was given 2 nitroglycerin  and 324 mg of aspirin .  In the emergency department patient was noted to be afebrile with pulse 45-67, blood pressures elevated up to 159/86, and O2 saturations maintained.  EKG was noted to be unchanged from prior.  Labs noted WBC 10.5, BUN 19, creatinine 1.29, glucose 294, anion gap 9, and high-sensitivity troponin 61-> 55.  Chest x-ray noted mild prominent interstitial opacities.   Review of Systems: As mentioned in the history of present illness. All other systems reviewed and are negative. Past Medical History:  Diagnosis Date   Anginal pain    Arthritis    CAD (coronary artery disease)    LHC (02/26/14):  >> PCI:  Xience DES to pRCA // LHC (03/10/14):  >> PCI:  Xience DES to [facility] // Myoview  2/16: low risk // Cath 2016: patent stents; stable dz - med Rx // Myoview  10/22: anterolateral ischemia, inferoapical infarct w peri infarct ischemia; EF 41; High Risk // Cath 12/22: LCx stent occl, severe LAD and Dx dz, diffuse RCA disease >> CABG   Carotid stenosis    a. Carotid US  (02/27/14):  Bilateral ICA 1-39%  CKD (chronic kidney disease) stage 3    Depression    Diabetes mellitus    Dissection of mesenteric artery    a. Mesenteric Artery Duplex (7/15):  pSMA chronic dissection with aneurysmal dilation of 1.29 cm (VVS);  b.  Chest CTA (02/26/14):  IMPRESSION:  1. No aortic  dissection or other acute abnormality.  2. 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  3. Stable 17 mm ectasia of the left common iliac artery.  4.  Atherosclerosis, including aortoiliac and coronary artery disease.    HFmrEF (heart failure with mildly reduced EF) 09/2015   Echo 6/15: EF 50-55 // Echo 1/17: mod LVH, EF 55%, inf-lat HK, Gr 1 DD, mild LAE // Echocardiogram 10/22: EF 40-45, Gr 1 DD, mildly reduced RVSF, RVSP 26.6, trivial MR, trivial AI, post and ant-lat HK   History of kidney stones    Hyperlipidemia    Hypertension    Iliac artery aneurysm, left    Myocardial infarction (HCC)    PVC's (premature ventricular contractions) 07/21/2021   Monitor 10/22:  NSR avg HR 71; no AF/Flutter; no high grade HB or pathologic pauses PVC burden 20%, rare supraventricular beats w/o sustained arrhythmia    Stroke (HCC) 09/26/2009   Past Surgical History:  Procedure Laterality Date   CARDIAC CATHETERIZATION     CARDIAC CATHETERIZATION N/A 06/25/2015   Procedure: Left Heart Cath and Coronary Angiography;  Surgeon: Lonni JONETTA Cash, MD;  Location: [facility] INVASIVE CV LAB;  Service: Cardiovascular;  Laterality: N/A;   COLONOSCOPY  2016   DJ-MAC-moviprep (adeq)-TA-40yr recall   COLONOSCOPY WITH PROPOFOL  N/A 07/23/2015   Procedure: COLONOSCOPY WITH PROPOFOL ;  Surgeon: Toribio SHAUNNA Cedar, MD;  Location: [facility] ENDOSCOPY;  Service: Endoscopy;  Laterality: N/A;   CORONARY ANGIOPLASTY     CORONARY ARTERY BYPASS GRAFT N/A 09/21/2021   Procedure: CORONARY ARTERY BYPASS GRAFTING (CABG)  X FOUR, ON PUMP, USING LEFT INTERNAL MAMMARY ARTERY AND RIGHT ENDOSCOPIC GREATER SAPHENOUS VEIN CONDUITS;  Surgeon: Shyrl Linnie KIDD, MD;  Location: [facility] OR;  Service: Open Heart Surgery;  Laterality: N/A;   CYSTOSCOPY/URETEROSCOPY/HOLMIUM LASER/STENT PLACEMENT Right 03/28/2023   Procedure: CYSTOSCOPY, RIGHT RETROGRADE PYELOGRAM, DIAGNOSTIC RIGHT URETEROSCOPY;  Surgeon: Elisabeth Valli JONETTA, MD;  Location: [facility] ORS;  Service: Urology;  Laterality: Right;   ENDOVEIN HARVEST OF GREATER SAPHENOUS VEIN  09/21/2021   Procedure: ENDOVEIN HARVEST OF GREATER SAPHENOUS VEIN;  Surgeon:  Shyrl Linnie KIDD, MD;  Location: [facility] OR;  Service: Open Heart Surgery;;   EXTRACORPOREAL SHOCK WAVE LITHOTRIPSY Right 11/21/2022   Procedure: EXTRACORPOREAL SHOCK WAVE LITHOTRIPSY (ESWL);  Surgeon: Devere Lonni Righter, MD;  Location: [facility];  Service: Urology;  Laterality: Right;   LEFT HEART CATH AND CORONARY ANGIOGRAPHY N/A 08/31/2021   Procedure: LEFT HEART CATH AND CORONARY ANGIOGRAPHY;  Surgeon: Claudene Victory ORN, MD;  Location: [facility] INVASIVE CV LAB;  Service: Cardiovascular;  Laterality: N/A;   LEFT HEART CATHETERIZATION WITH CORONARY ANGIOGRAM N/A 02/26/2014   Procedure: LEFT HEART CATHETERIZATION WITH CORONARY ANGIOGRAM;  Surgeon: Deatrice DELENA Cage, MD;  Location: [facility] CATH LAB;  Service: Cardiovascular;  Laterality: N/A;   LEFT HEART CATHETERIZATION WITH CORONARY ANGIOGRAM N/A 03/10/2014   Procedure: LEFT HEART CATHETERIZATION WITH CORONARY ANGIOGRAM;  Surgeon: Candyce GORMAN Reek, MD;  Location: [facility] CATH LAB;  Service: Cardiovascular;  Laterality: N/A;   POLYPECTOMY  2016   TA   RIGHT HEART CATH AND CORONARY/GRAFT ANGIOGRAPHY N/A 07/01/2022   Procedure: RIGHT HEART CATH AND CORONARY/GRAFT ANGIOGRAPHY;  Surgeon: Wonda Sharper, MD;  Location: [facility] INVASIVE  CV LAB;  Service: Cardiovascular;  Laterality: N/A;   TEE WITHOUT CARDIOVERSION N/A 09/21/2021   Procedure: TRANSESOPHAGEAL ECHOCARDIOGRAM (TEE);  Surgeon: Shyrl Linnie KIDD, MD;  Location: [facility] OR;  Service: Open Heart Surgery;  Laterality: N/A;   Social History:  reports that he quit smoking about 17 years ago. His smoking use included cigarettes. He started smoking about 55 years ago. He has a 9.5 pack-year smoking history. He has never used smokeless tobacco. He reports that he does not drink alcohol and does not use drugs.  Allergies  Allergen Reactions   Metformin  And Related     Pt feels that this causes constipation and will not take     Family History  Problem Relation Age of Onset   Diabetes Father     Hypertension Father    Heart attack Father    Stroke Neg Hx     Prior to Admission medications   Medication Sig Start Date End Date Taking? Authorizing Provider  dapagliflozin  propanediol (FARXIGA ) 5 MG TABS tablet Take 1 tablet (5 mg total) by mouth daily before breakfast. 02/07/24   Copland, Jessica C, MD  insulin  glargine (LANTUS  SOLOSTAR) 100 UNIT/ML Solostar Pen Inject 27-30 Units into the skin daily. 05/31/24   Copland, Harlene BROCKS, MD  isosorbide  mononitrate (IMDUR ) 30 MG 24 hr tablet Take 1 tablet (30 mg total) by mouth daily. You can take an additional half tablet as needed for chest pain 10/07/22   Wyn Jackee VEAR Mickey., NP  losartan  (COZAAR ) 25 MG tablet Take 1 tablet (25 mg total) by mouth daily. 02/07/24   Copland, Harlene BROCKS, MD  metoprolol  tartrate (LOPRESSOR ) 25 MG tablet Take 1 tablet (25 mg total) by mouth 2 (two) times daily. 10/12/21   Vannie Reche RAMAN, NP  nitroGLYCERIN  (NITROSTAT ) 0.4 MG SL tablet Place 1 tablet (0.4 mg total) under the tongue every 5 (five) minutes as needed for chest pain. 07/28/22   Copland, Harlene BROCKS, MD  rosuvastatin  (CRESTOR ) 40 MG tablet Take 1 tablet (40 mg total) by mouth daily. 02/07/24   Watt Harlene BROCKS, MD    Physical Exam: Vitals:   08/26/24 0353 08/26/24 0400 08/26/24 0434 08/26/24 0515  BP:  (!) 155/76 (!) 158/84 (!) 159/86  Pulse:  (!) 52 (!) 48 (!) 45  Resp:  18 14 15   Temp:      TempSrc:      SpO2:  100% 96% 100%  Weight: 74.8 kg     Height: 5' 5 (1.651 m)       Constitutional: Elderly male currently in no acute distress Eyes: PERRL, lids and conjunctivae normal ENMT: Mucous membranes are moist.  Neck: normal, supple  Respiratory: clear to auscultation bilaterally, no wheezing, no crackles. Normal respiratory effort. No accessory muscle use.  Cardiovascular: Bradycardic, no murmurs / rubs / gallops. No extremity edema. 2+ pedal pulses.  Abdomen: no tenderness, no masses palpated.  Bowel sounds positive.  Musculoskeletal: no  clubbing / cyanosis. No joint deformity upper and lower extremities. Good ROM, no contractures. Normal muscle tone.  Skin: no rashes, lesions, ulcers. No induration Neurologic: CN 2-12 grossly intact.   Strength 5/5 in all 4.  Psychiatric: Alert and oriented x 3. Normal mood.   Data Reviewed:  EKG revealed sinus bradycardia at 52 bpm with nonspecific intraventricular conduction delay.  Reviewed labs, imaging, and pertinent records as documented.  Assessment and Plan:  Chest pain Elevated troponin Coronary artery disease Acute.  Patient presented with burning chest pain waking him up from  his sleep.  EKG appearing similar to priors.  High-sensitivity troponin 61->55.  Patient had been given full dose aspirin  and nitroglycerin  prior to arrival.  Patient noted to have significant history of coronary artery disease with prior stenting and CABG.  Last cardiac cath back in 2023 noted multivessel coronary artery disease with patency of the left main, total occlusion of the left circumflex, severe stenosis of the proximal LAD, patency of the RCA with mild in-stent restenosis, mild to moderate nonobstructive plaque in the mid vessel,  continued patency of the LIMA to LAD, and total occlusion of the saphenous vein graft conduits. - Admit to a cardiac telemetry bed - Check echocardiogram - Recheck EKG as needed for chest pain - Continue aspirin  and statin - Nitroglycerin  as needed for chest pain - Cardiology consulted to evaluate,  will follow-up for any further recommendation  Uncontrolled diabetes mellitus type 2 with hyperglycemia, with long-term use of insulin  On admission glucose noted to be elevated up to 295.  Last available hemoglobin A1c noted to be 9.5 when checked 02/07/2024.  He reports taking long-acting insulin  32 units daily and what sounds like a short acting insulin  12 units as needed. - Hypoglycemia protocols - Check hemoglobin A1c - Would recommend resuming Jardiance - Continue  pharmacy substitution of Semglee  32 units daily - CBGs before every meal with moderate SSI - Adjust insulin  regimen as needed  Heart failure with mildly reduced EF Patient appears to be fairly euvolemic on physical exam.  Chest x-ray noted mild prominent interstitial opacities.  Last echocardiogram noted EF to be 45 to 50% back in 08/2021.  Question if patient is mild fluid overload - Strict I&Os and daily weights - Check BNP - Follow-up echocardiogram - May warrant diuresis  Essential hypertension Blood pressures elevated up to 159/86   Patient not on any blood pressure medications currently due to reported side effects.  Question patient understanding of importance of taking medications with language barrier. - Would recommend restarting losartan  - Hydralazine  IV as needed for elevated blood pressure  Sinus bradycardia Heart rates noted to be in the 50s blood pressures otherwise maintained. - Continue to monitor  Dyslipidemia Patient reports that he is no longer taking any medications for treatment - Check lipid panel - Recommend patient to resume Crestor   History of CVA/TIA Patient makes note of remote history of stroke with unclear residual symptoms of numbness tingling.  Chronic kidney disease stage IIIa Creatinine noted to be 1.29 which appears around patient's baseline. - Continue to monitor  DVT prophylaxis: Lovenox  Advance Care Planning:   Code Status: Full Code   Consults: Cardiology  Family Communication: Son updated at bedside  Severity of Illness: The appropriate patient status for this patient is OBSERVATION. Observation status is judged to be reasonable and necessary in order to provide the required intensity of service to ensure the patient's safety. The patient's presenting symptoms, physical exam findings, and initial radiographic and laboratory data in the context of their medical condition is felt to place them at decreased risk for further clinical  deterioration. Furthermore, it is anticipated that the patient will be medically stable for discharge from the hospital within 2 midnights of admission.   Author: Maximino DELENA Sharps, MD 08/26/2024 7:10 AM  For on call review [email or web address].

## 2024-08-26 NOTE — Progress Notes (Signed)
 PHARMACY - ANTICOAGULATION CONSULT NOTE  Pharmacy Consult for Heparin  Indication: chest pain/ACS  Allergies  Allergen Reactions   Metformin  And Related Other (See Comments)    Pt feels that this causes constipation and will not take     Patient Measurements: Height: 5' 5 (165.1 cm) Weight: 74.8 kg (165 lb) IBW/kg (Calculated) : 61.5 HEPARIN  DW (KG): 74.8  Vital Signs: Temp: 98.7 F (37.1 C) (12/01 1212) Temp Source: Oral (12/01 1212) BP: 153/89 (12/01 1212) Pulse Rate: 52 (12/01 1212)  Labs: Recent Labs    08/26/24 0408 08/26/24 0608  HGB 14.8  --   HCT 46.4  --   PLT 150  --   CREATININE 1.29*  --   TROPONINIHS 61* 55*    Estimated Creatinine Clearance: 47.5 mL/min (A) (by C-G formula based on SCr of 1.29 mg/dL (H)).   Medical History: Past Medical History:  Diagnosis Date   Anginal pain    Arthritis    CAD (coronary artery disease)    LHC (02/26/14):  >> PCI:  Xience DES to pRCA // LHC (03/10/14):  >> PCI:  Xience DES to mCFX // Myoview  2/16: low risk // Cath 2016: patent stents; stable dz - med Rx // Myoview  10/22: anterolateral ischemia, inferoapical infarct w peri infarct ischemia; EF 41; High Risk // Cath 12/22: LCx stent occl, severe LAD and Dx dz, diffuse RCA disease >> CABG   Carotid stenosis    a. Carotid US  (02/27/14):  Bilateral ICA 1-39%   CKD (chronic kidney disease) stage 3    Depression    Diabetes mellitus    Dissection of mesenteric artery    a. Mesenteric Artery Duplex (7/15):  pSMA chronic dissection with aneurysmal dilation of 1.29 cm (VVS);  b.  Chest CTA (02/26/14):  IMPRESSION:  1. No aortic  dissection or other acute abnormality.  2. 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  3. Stable 17 mm ectasia of the left common iliac artery.  4. Atherosclerosis, including aortoiliac and coronary artery disease.    HFmrEF (heart failure with mildly reduced EF) 09/2015   Echo 6/15: EF 50-55 // Echo 1/17: mod LVH, EF 55%, inf-lat HK, Gr 1 DD,  mild LAE // Echocardiogram 10/22: EF 40-45, Gr 1 DD, mildly reduced RVSF, RVSP 26.6, trivial MR, trivial AI, post and ant-lat HK   History of kidney stones    Hyperlipidemia    Hypertension    Iliac artery aneurysm, left    Myocardial infarction (HCC)    PVC's (premature ventricular contractions) 07/21/2021   Monitor 10/22:  NSR avg HR 71; no AF/Flutter; no high grade HB or pathologic pauses PVC burden 20%, rare supraventricular beats w/o sustained arrhythmia    Stroke (HCC) 09/26/2009    Medications:  Awaiting home med rec  Assessment: 73 y.o. M presents with CP. To begin heparin  for r/o ACS. Plan for cath tomorrow. No AC pta. Pt received Lovenox  40mg  for VTE prophylaxis 12/1 1246.  Goal of Therapy:  Heparin  level 0.3-0.7 units/ml Monitor platelets by anticoagulation protocol: Yes   Plan:  D/c Lovenox  Heparin  IV bolus 2000 units (1/2 bolus with recent Lovenox ) Heparin  gtt at 1050 units/hr Will f/u heparin  level in 8 hours Daily heparin  level and CBC  Vito Ralph, PharmD, BCPS Please see amion for complete clinical pharmacist phone list 08/26/2024,4:28 PM

## 2024-08-27 ENCOUNTER — Other Ambulatory Visit (HOSPITAL_COMMUNITY): Payer: Self-pay

## 2024-08-27 ENCOUNTER — Encounter (HOSPITAL_COMMUNITY): Payer: Self-pay | Admitting: Cardiology

## 2024-08-27 ENCOUNTER — Telehealth (HOSPITAL_COMMUNITY): Payer: Self-pay | Admitting: Pharmacy Technician

## 2024-08-27 ENCOUNTER — Encounter (HOSPITAL_COMMUNITY): Admission: EM | Disposition: A | Payer: Self-pay | Source: Home / Self Care | Attending: Emergency Medicine

## 2024-08-27 DIAGNOSIS — R7989 Other specified abnormal findings of blood chemistry: Secondary | ICD-10-CM | POA: Diagnosis not present

## 2024-08-27 DIAGNOSIS — R079 Chest pain, unspecified: Secondary | ICD-10-CM | POA: Diagnosis present

## 2024-08-27 DIAGNOSIS — I25119 Atherosclerotic heart disease of native coronary artery with unspecified angina pectoris: Secondary | ICD-10-CM | POA: Diagnosis not present

## 2024-08-27 HISTORY — PX: RIGHT/LEFT HEART CATH AND CORONARY/GRAFT ANGIOGRAPHY: CATH118267

## 2024-08-27 HISTORY — PX: CORONARY STENT INTERVENTION: CATH118234

## 2024-08-27 LAB — CBC
HCT: 47.4 % (ref 39.0–52.0)
Hemoglobin: 15.5 g/dL (ref 13.0–17.0)
MCH: 28.3 pg (ref 26.0–34.0)
MCHC: 32.7 g/dL (ref 30.0–36.0)
MCV: 86.7 fL (ref 80.0–100.0)
Platelets: 146 K/uL — ABNORMAL LOW (ref 150–400)
RBC: 5.47 MIL/uL (ref 4.22–5.81)
RDW: 12.3 % (ref 11.5–15.5)
WBC: 10.4 K/uL (ref 4.0–10.5)
nRBC: 0 % (ref 0.0–0.2)

## 2024-08-27 LAB — POCT I-STAT EG7
Acid-Base Excess: 2 mmol/L (ref 0.0–2.0)
Bicarbonate: 28 mmol/L (ref 20.0–28.0)
Calcium, Ion: 1.23 mmol/L (ref 1.15–1.40)
HCT: 48 % (ref 39.0–52.0)
Hemoglobin: 16.3 g/dL (ref 13.0–17.0)
O2 Saturation: 61 %
Potassium: 3.3 mmol/L — ABNORMAL LOW (ref 3.5–5.1)
Sodium: 139 mmol/L (ref 135–145)
TCO2: 29 mmol/L (ref 22–32)
pCO2, Ven: 48.2 mmHg (ref 44–60)
pH, Ven: 7.373 (ref 7.25–7.43)
pO2, Ven: 33 mmHg (ref 32–45)

## 2024-08-27 LAB — HEPARIN LEVEL (UNFRACTIONATED): Heparin Unfractionated: 0.78 [IU]/mL — ABNORMAL HIGH (ref 0.30–0.70)

## 2024-08-27 LAB — POCT I-STAT 7, (LYTES, BLD GAS, ICA,H+H)
Acid-Base Excess: 3 mmol/L — ABNORMAL HIGH (ref 0.0–2.0)
Bicarbonate: 28.5 mmol/L — ABNORMAL HIGH (ref 20.0–28.0)
Calcium, Ion: 1.18 mmol/L (ref 1.15–1.40)
HCT: 46 % (ref 39.0–52.0)
Hemoglobin: 15.6 g/dL (ref 13.0–17.0)
O2 Saturation: 94 %
Potassium: 3.2 mmol/L — ABNORMAL LOW (ref 3.5–5.1)
Sodium: 138 mmol/L (ref 135–145)
TCO2: 30 mmol/L (ref 22–32)
pCO2 arterial: 44.8 mmHg (ref 32–48)
pH, Arterial: 7.411 (ref 7.35–7.45)
pO2, Arterial: 72 mmHg — ABNORMAL LOW (ref 83–108)

## 2024-08-27 LAB — BASIC METABOLIC PANEL WITH GFR
Anion gap: 8 (ref 5–15)
BUN: 16 mg/dL (ref 8–23)
CO2: 27 mmol/L (ref 22–32)
Calcium: 8.7 mg/dL — ABNORMAL LOW (ref 8.9–10.3)
Chloride: 105 mmol/L (ref 98–111)
Creatinine, Ser: 1.33 mg/dL — ABNORMAL HIGH (ref 0.61–1.24)
GFR, Estimated: 56 mL/min — ABNORMAL LOW (ref >60)
Glucose, Bld: 142 mg/dL — ABNORMAL HIGH (ref 70–99)
Potassium: 3.5 mmol/L (ref 3.5–5.1)
Sodium: 140 mmol/L (ref 135–145)

## 2024-08-27 LAB — GLUCOSE, CAPILLARY
Glucose-Capillary: 118 mg/dL — ABNORMAL HIGH (ref 70–99)
Glucose-Capillary: 181 mg/dL — ABNORMAL HIGH (ref 70–99)
Glucose-Capillary: 121 mg/dL — ABNORMAL HIGH (ref 70–99)

## 2024-08-27 LAB — POCT ACTIVATED CLOTTING TIME: Activated Clotting Time: 404 s

## 2024-08-27 MED ORDER — METOPROLOL SUCCINATE ER 25 MG PO TB24: 25.0000 mg | ORAL_TABLET | Freq: Every day | ORAL | Status: DC

## 2024-08-27 MED ORDER — HEPARIN SODIUM (PORCINE) 1000 UNIT/ML IJ SOLN
INTRAMUSCULAR | Status: AC
  Filled 2024-08-27: qty 10

## 2024-08-27 MED ORDER — ASPIRIN 81 MG PO CHEW
81.0000 mg | CHEWABLE_TABLET | ORAL | Status: AC
  Administered 2024-08-27: 81 mg via ORAL
  Filled 2024-08-27: qty 1

## 2024-08-27 MED ORDER — TIROFIBAN (AGGRASTAT) 50MCG/ML FOR INTRACORONARY USE
INTRACORONARY | Status: DC | PRN
  Administered 2024-08-27: 1000 ug via INTRACORONARY

## 2024-08-27 MED ORDER — HYDRALAZINE HCL 20 MG/ML IJ SOLN
INTRAMUSCULAR | Status: AC
  Administered 2024-08-27: 10 mg via INTRAVENOUS
  Filled 2024-08-27: qty 1

## 2024-08-27 MED ORDER — NITROGLYCERIN 1 MG/10 ML FOR IR/CATH LAB
INTRA_ARTERIAL | Status: DC | PRN
  Administered 2024-08-27: 200 ug via INTRACORONARY

## 2024-08-27 MED ORDER — FENTANYL CITRATE (PF) 100 MCG/2ML IJ SOLN
INTRAMUSCULAR | Status: AC
  Filled 2024-08-27: qty 2

## 2024-08-27 MED ORDER — TIROFIBAN HCL IV 12.5 MG/250 ML
INTRAVENOUS | Status: AC | PRN
  Administered 2024-08-27: .075 ug/kg/min via INTRAVENOUS

## 2024-08-27 MED ORDER — MIDAZOLAM HCL (PF) 2 MG/2ML IJ SOLN
INTRAMUSCULAR | Status: DC | PRN
  Administered 2024-08-27: 2 mg via INTRAVENOUS

## 2024-08-27 MED ORDER — VERAPAMIL HCL 2.5 MG/ML IV SOLN
INTRAVENOUS | Status: DC | PRN
  Administered 2024-08-27: 10 mL via INTRA_ARTERIAL

## 2024-08-27 MED ORDER — FREE WATER
250.0000 mL | Freq: Once | Status: AC
  Administered 2024-08-27: 250 mL via ORAL

## 2024-08-27 MED ORDER — SODIUM CHLORIDE 0.9 % IV SOLN: 250.0000 mL | INTRAVENOUS | Status: DC | PRN

## 2024-08-27 MED ORDER — IOHEXOL 350 MG/ML SOLN
INTRAVENOUS | Status: DC | PRN
  Administered 2024-08-27: 95 mL

## 2024-08-27 MED ORDER — TIROFIBAN HCL IN NACL 5-0.9 MG/100ML-% IV SOLN
INTRAVENOUS | Status: AC
  Filled 2024-08-27: qty 100

## 2024-08-27 MED ORDER — TICAGRELOR 90 MG PO TABS
ORAL_TABLET | ORAL | Status: DC | PRN
  Administered 2024-08-27: 180 mg via ORAL

## 2024-08-27 MED ORDER — FREE WATER
500.0000 mL | Freq: Once | Status: AC
  Administered 2024-08-27: 500 mL via ORAL

## 2024-08-27 MED ORDER — LIDOCAINE HCL (PF) 1 % IJ SOLN
INTRAMUSCULAR | Status: AC
  Filled 2024-08-27: qty 30

## 2024-08-27 MED ORDER — VERAPAMIL HCL 2.5 MG/ML IV SOLN
INTRAVENOUS | Status: AC
  Filled 2024-08-27: qty 2

## 2024-08-27 MED ORDER — TIROFIBAN (AGGRASTAT) BOLUS VIA INFUSION
INTRAVENOUS | Status: DC | PRN
  Administered 2024-08-27: 1852.5 ug via INTRAVENOUS

## 2024-08-27 MED ORDER — SODIUM CHLORIDE 0.9% FLUSH
3.0000 mL | INTRAVENOUS | Status: DC | PRN
  Administered 2024-08-27: 3 mL via INTRAVENOUS

## 2024-08-27 MED ORDER — TIROFIBAN HCL IV 12.5 MG/250 ML
0.0750 ug/kg/min | INTRAVENOUS | Status: AC
  Administered 2024-08-27: 0.075 ug/kg/min via INTRAVENOUS
  Filled 2024-08-27: qty 250

## 2024-08-27 MED ORDER — MIDAZOLAM HCL 2 MG/2ML IJ SOLN
INTRAMUSCULAR | Status: AC
  Filled 2024-08-27: qty 2

## 2024-08-27 MED ORDER — HEPARIN SODIUM (PORCINE) 1000 UNIT/ML IJ SOLN
INTRAMUSCULAR | Status: DC | PRN
  Administered 2024-08-27: 4000 [IU] via INTRAVENOUS
  Administered 2024-08-27: 5000 [IU] via INTRAVENOUS

## 2024-08-27 MED ORDER — TICAGRELOR 90 MG PO TABS
ORAL_TABLET | ORAL | Status: AC
  Filled 2024-08-27: qty 2

## 2024-08-27 MED ORDER — ASPIRIN 81 MG PO CHEW: 81.0000 mg | CHEWABLE_TABLET | ORAL | Status: DC

## 2024-08-27 MED ORDER — FREE WATER: 250.0000 mL | Freq: Once | Status: DC

## 2024-08-27 MED ORDER — FENTANYL CITRATE (PF) 100 MCG/2ML IJ SOLN
INTRAMUSCULAR | Status: DC | PRN
  Administered 2024-08-27: 25 ug via INTRAVENOUS

## 2024-08-27 MED ORDER — TICAGRELOR 90 MG PO TABS
90.0000 mg | ORAL_TABLET | Freq: Two times a day (BID) | ORAL | Status: DC
  Administered 2024-08-27 – 2024-08-28 (×2): 90 mg via ORAL
  Filled 2024-08-27 (×2): qty 1

## 2024-08-27 MED ORDER — NITROGLYCERIN 1 MG/10 ML FOR IR/CATH LAB
INTRA_ARTERIAL | Status: AC
  Filled 2024-08-27: qty 10

## 2024-08-27 MED ORDER — HEPARIN (PORCINE) IN NACL 1000-0.9 UT/500ML-% IV SOLN
INTRAVENOUS | Status: DC | PRN
  Administered 2024-08-27 (×2): 500 mL

## 2024-08-27 MED ORDER — SODIUM CHLORIDE 0.9% FLUSH
3.0000 mL | Freq: Two times a day (BID) | INTRAVENOUS | Status: DC
  Administered 2024-08-27 – 2024-08-28 (×2): 3 mL via INTRAVENOUS

## 2024-08-27 MED ORDER — LIDOCAINE HCL (PF) 1 % IJ SOLN
INTRAMUSCULAR | Status: DC | PRN
  Administered 2024-08-27 (×2): 5 mL via INTRADERMAL

## 2024-08-27 NOTE — Plan of Care (Signed)
  Problem: Education: Goal: Ability to describe self-care measures that may prevent or decrease complications (Diabetes Survival Skills Education) will improve Outcome: Progressing Goal: Individualized Educational Video(s) Outcome: Progressing   Problem: Coping: Goal: Ability to adjust to condition or change in health will improve Outcome: Progressing   Problem: Fluid Volume: Goal: Ability to maintain a balanced intake and output will improve Outcome: Progressing   Problem: Health Behavior/Discharge Planning: Goal: Ability to identify and utilize available resources and services will improve Outcome: Progressing Goal: Ability to manage health-related needs will improve Outcome: Progressing   Problem: Metabolic: Goal: Ability to maintain appropriate glucose levels will improve Outcome: Progressing   Problem: Nutritional: Goal: Maintenance of adequate nutrition will improve Outcome: Progressing Goal: Progress toward achieving an optimal weight will improve Outcome: Progressing   Problem: Skin Integrity: Goal: Risk for impaired skin integrity will decrease Outcome: Progressing   Problem: Tissue Perfusion: Goal: Adequacy of tissue perfusion will improve Outcome: Progressing   Problem: Education: Goal: Understanding of cardiac disease, CV risk reduction, and recovery process will improve Outcome: Progressing Goal: Individualized Educational Video(s) Outcome: Progressing   Problem: Activity: Goal: Ability to tolerate increased activity will improve Outcome: Progressing   Problem: Cardiac: Goal: Ability to achieve and maintain adequate cardiovascular perfusion will improve Outcome: Progressing   Problem: Health Behavior/Discharge Planning: Goal: Ability to safely manage health-related needs after discharge will improve Outcome: Progressing   Problem: Education: Goal: Knowledge of General Education information will improve Description: Including pain rating scale,  medication(s)/side effects and non-pharmacologic comfort measures Outcome: Progressing   Problem: Health Behavior/Discharge Planning: Goal: Ability to manage health-related needs will improve Outcome: Progressing   Problem: Clinical Measurements: Goal: Ability to maintain clinical measurements within normal limits will improve Outcome: Progressing Goal: Will remain free from infection Outcome: Progressing Goal: Diagnostic test results will improve Outcome: Progressing Goal: Respiratory complications will improve Outcome: Progressing Goal: Cardiovascular complication will be avoided Outcome: Progressing   Problem: Activity: Goal: Risk for activity intolerance will decrease Outcome: Progressing   Problem: Nutrition: Goal: Adequate nutrition will be maintained Outcome: Progressing   Problem: Coping: Goal: Level of anxiety will decrease Outcome: Progressing   Problem: Elimination: Goal: Will not experience complications related to bowel motility Outcome: Progressing Goal: Will not experience complications related to urinary retention Outcome: Progressing   Problem: Pain Managment: Goal: General experience of comfort will improve and/or be controlled Outcome: Progressing   Problem: Safety: Goal: Ability to remain free from injury will improve Outcome: Progressing   Problem: Skin Integrity: Goal: Risk for impaired skin integrity will decrease Outcome: Progressing   Problem: Education: Goal: Understanding of CV disease, CV risk reduction, and recovery process will improve Outcome: Progressing Goal: Individualized Educational Video(s) Outcome: Progressing   Problem: Activity: Goal: Ability to return to baseline activity level will improve Outcome: Progressing   Problem: Cardiovascular: Goal: Ability to achieve and maintain adequate cardiovascular perfusion will improve Outcome: Progressing Goal: Vascular access site(s) Level 0-1 will be maintained Outcome:  Progressing   Problem: Health Behavior/Discharge Planning: Goal: Ability to safely manage health-related needs after discharge will improve Outcome: Progressing

## 2024-08-27 NOTE — Assessment & Plan Note (Signed)
-   noted hx of bradycardia on BB - Heart rate controlled and patient started on low-dose Coreg  per cardiology at discharge.  Follow-up tolerance

## 2024-08-27 NOTE — Hospital Course (Signed)
 Barry [name] is a 73 yo male with PMH CAD s/p CABG 2022, CHF, TIA/CVA, DMII, mesenteric artery dissection who presented with chest pain and numbness in legs. Chest pain awoke patient from sleep at onset.  He was admitted for further chest pain workup and cardiology evaluation.

## 2024-08-27 NOTE — Assessment & Plan Note (Signed)
-   see med modifications in setting of GDMT

## 2024-08-27 NOTE — Progress Notes (Signed)
 Progress Note    Barry [name]   FMW:[id number]  DOB: 02/03/1951  DOA: 08/26/2024     0 PCP: Barry Harlene BROCKS, MD  Initial CC: CP  Hospital Course: [name] is a 73 yo male with PMH CAD s/p CABG 2022, CHF, TIA/CVA, DMII, mesenteric artery dissection who presented with chest pain and numbness in legs. Chest pain awoke patient from sleep at onset.  He was admitted for further chest pain workup and cardiology evaluation.  Interval History:  Son is present bedside this morning and helped translate per their preference.  Patient denying any significant chest pain in the morning. Undergoing cath today.   Assessment and Plan: * Chest pain - awoke patient from sleep at onset; known CAD with underlying prior known nonobs mid RCA plaque noted on 06/2022 cath; admitted for repeat cath in case of progression of lesion contributing to CP - trop 61 and 55 on admission - started on heparin  drip - underwent L/R heart cath on 12/2; found to have worsening stenosis involving mid to distal RCA and underwent DES placement - Aspirin  and Brilinta recommended per cardiology-minimum 1 year  Elevated troponin - See chest pain workup  Coronary artery disease - s/p CABG 2022; repeat cath 2023 (med management after) - LDL 148.  Continue statin --See chest pain workup above.  Continue aspirin  and Brilinta.  Potentially monotherapy Plavix  after completion.  Deferred to cardiology  Uncontrolled type 2 diabetes mellitus with hyperglycemia, with long-term current use of insulin  (HCC) - follow up A1c - continue SSI and CBGs  Acute on chronic heart failure with mildly reduced ejection fraction (HFmrEF) (HCC) - No obvious signs of volume overload on exam; CXR appears clear as well - BNP 88 -Echo repeated on admission, EF 40 to 45%, LV RWMA.  Grade 1 DD.  Mildly reduced RV systolic function   Hypertension - follow up med modifications in setting of GDMT  Bradycardia- beta blocker decreased - noted hx of  bradycardia on BB  Dyslipidemia, goal LDL below 70 - LDL 148 - Continue statin  History of CVA in adulthood - continue asa and statin  - check B12 given complaints of LE paresthesias  CKD (chronic kidney disease) stage 3, GFR 30-59 ml/min (HCC) - patient has history of CKD3a. Baseline creat ~ 1.2 - 1.4, eGFR~ 50-55   Antimicrobials:   DVT prophylaxis:  Heparin  drip    Code Status:   Code Status: Full Code  Mobility Assessment (Last 72 Hours)     Mobility Assessment     Row Name 08/26/24 2000           Does the patient have exclusion criteria? No- Perform mobility assessment       What is the highest level of mobility based on the mobility assessment? Level 5 (Ambulates independently) - Balance while walking independently - Complete          Diet: Diet Orders (From admission, onward)     Start     Ordered   08/27/24 1305  Diet Heart Room service appropriate? Yes; Fluid consistency: Thin  Diet effective now       Question Answer Comment  Room service appropriate? Yes   Fluid consistency: Thin      08/27/24 1304            Barriers to discharge: none Disposition Plan:  Home  HH orders placed: none  Status is: Inpt  Objective: Blood pressure (!) 144/76, pulse 73, temperature 99.2 F (37.3 C), temperature source  Oral, resp. rate 12, height 5' 5 (1.651 m), weight 74.1 kg, SpO2 98%.  Examination:  Physical Exam Constitutional:      General: He is not in acute distress.    Appearance: Normal appearance.  HENT:     Head: Normocephalic and atraumatic.     Mouth/Throat:     Mouth: Mucous membranes are moist.  Eyes:     Extraocular Movements: Extraocular movements intact.  Cardiovascular:     Rate and Rhythm: Normal rate and regular rhythm.  Pulmonary:     Effort: Pulmonary effort is normal. No respiratory distress.     Breath sounds: Normal breath sounds. No wheezing.  Abdominal:     General: Bowel sounds are normal. There is no distension.      Palpations: Abdomen is soft.     Tenderness: There is no abdominal tenderness.  Musculoskeletal:        General: Normal range of motion.     Cervical back: Normal range of motion and neck supple.  Skin:    General: Skin is warm and dry.  Neurological:     General: No focal deficit present.     Mental Status: He is alert.  Psychiatric:        Mood and Affect: Mood normal.        Behavior: Behavior normal.      Consultants:  Cardiology  Procedures:  12/2: Right/left heart cath   Data Reviewed: Results for orders placed or performed during the hospital encounter of 08/26/24 (from the past 24 hours)  Glucose, capillary     Status: Abnormal   Collection Time: 08/26/24  4:42 PM  Result Value Ref Range   Glucose-Capillary 147 (H) 70 - 99 mg/dL  Glucose, capillary     Status: Abnormal   Collection Time: 08/26/24  9:02 PM  Result Value Ref Range   Glucose-Capillary 200 (H) 70 - 99 mg/dL  Heparin  level (unfractionated)     Status: Abnormal   Collection Time: 08/27/24  3:06 AM  Result Value Ref Range   Heparin  Unfractionated 0.78 (H) 0.30 - 0.70 IU/mL  Basic metabolic panel     Status: Abnormal   Collection Time: 08/27/24  3:06 AM  Result Value Ref Range   Sodium 140 135 - 145 mmol/L   Potassium 3.5 3.5 - 5.1 mmol/L   Chloride 105 98 - 111 mmol/L   CO2 27 22 - 32 mmol/L   Glucose, Bld 142 (H) 70 - 99 mg/dL   BUN 16 8 - 23 mg/dL   Creatinine, Ser 8.66 (H) 0.61 - 1.24 mg/dL   Calcium  8.7 (L) 8.9 - 10.3 mg/dL   GFR, Estimated 56 (L) >60 mL/min   Anion gap 8 5 - 15  CBC     Status: Abnormal   Collection Time: 08/27/24  3:06 AM  Result Value Ref Range   WBC 10.4 4.0 - 10.5 K/uL   RBC 5.47 4.22 - 5.81 MIL/uL   Hemoglobin 15.5 13.0 - 17.0 g/dL   HCT 52.5 60.9 - 47.9 %   MCV 86.7 80.0 - 100.0 fL   MCH 28.3 26.0 - 34.0 pg   MCHC 32.7 30.0 - 36.0 g/dL   RDW 87.6 88.4 - 84.4 %   Platelets 146 (L) 150 - 400 K/uL   nRBC 0.0 0.0 - 0.2 %  Glucose, capillary     Status: Abnormal    Collection Time: 08/27/24  6:16 AM  Result Value Ref Range   Glucose-Capillary 118 (H) 70 - 99  mg/dL    I have reviewed pertinent nursing notes, vitals, labs, and images as necessary. I have ordered labwork to follow up on as indicated.  I have reviewed the last notes from staff over past 24 hours. I have discussed patient's care plan and test results with nursing staff, CM/SW, and other staff as appropriate.  Old records reviewed in assessment of this patient  Time spent: Greater than 50% of the 55 minute visit was spent in counseling/coordination of care for the patient as laid out in the A&P.   LOS: 0 days   Alm Apo, MD [facility] 08/27/2024, 3:39 PM

## 2024-08-27 NOTE — Interval H&P Note (Signed)
 History and Physical Interval Note:  08/27/2024 11:28 AM  Barry [name]  has presented today for surgery, with the diagnosis of CP.  The various methods of treatment have been discussed with the patient and family. After consideration of risks, benefits and other options for treatment, the patient has consented to  Procedure(s): RIGHT/LEFT HEART CATH AND CORONARY/GRAFT ANGIOGRAPHY (N/A) and possible coronary angioplasty as a surgical intervention.  The patient's history has been reviewed, patient examined, no change in status, stable for surgery.  I have reviewed the patient's chart and labs.  Questions were answered to the patient's satisfaction.     Gordy Bergamo

## 2024-08-27 NOTE — Assessment & Plan Note (Signed)
-   awoke patient from sleep at onset; known CAD with underlying prior known nonobs mid RCA plaque noted on 06/2022 cath; admitted for repeat cath in case of progression of lesion contributing to CP - trop 61 and 55 on admission - started on heparin  drip - underwent L/R heart cath on 12/2; found to have worsening stenosis involving mid to distal RCA and underwent DES placement - Aspirin  and Brilinta recommended per cardiology-minimum 1 year

## 2024-08-27 NOTE — Assessment & Plan Note (Signed)
-   LDL 148 - Continue statin

## 2024-08-27 NOTE — Assessment & Plan Note (Addendum)
-   continue asa and statin  - check B12 given complaints of LE paresthesias - B12 level 231.  Given injection during hospitalization

## 2024-08-27 NOTE — Assessment & Plan Note (Signed)
-   No obvious signs of volume overload on exam; CXR appears clear as well - BNP 88 -Echo repeated on admission, EF 40 to 45%, LV RWMA.  Grade 1 DD.  Mildly reduced RV systolic function

## 2024-08-27 NOTE — Assessment & Plan Note (Signed)
-   See chest pain workup

## 2024-08-27 NOTE — Assessment & Plan Note (Signed)
-   follow up A1c - continue SSI and CBGs

## 2024-08-27 NOTE — Assessment & Plan Note (Signed)
-   s/p CABG 2022; repeat cath 2023 (med management after) - LDL 148.  Continue statin --See chest pain workup above.  Continue aspirin  and Brilinta.  - Also started on low-dose Coreg  prior to discharge along with continuing Imdur .  Sim initiated also - consideration of spironolactone at outpt follow up also

## 2024-08-27 NOTE — Telephone Encounter (Signed)
 Patient Product/process Development Scientist completed.    The patient is insured through [facility]. Patient has Medicare and is not eligible for a copay card, but may be able to apply for patient assistance or Medicare RX Payment Plan (Patient Must reach out to their plan, if eligible for payment plan), if available.    Ran test claim for Entresto 24-26 mg and the current 30 day co-pay is $0.00.  Ran test claim for Brilinta 90 mg and the current 30 day co-pay is $0.00.  Ran test claim for Farxiga  10 mg and the current 30 day co-pay is $0.00.  Ran test claim for Jardiance 10 mg and the current 30 day co-pay is $0.00.  This test claim was processed through [facility]- copay amounts may vary at other pharmacies due to pharmacy/plan contracts, or as the patient moves through the different stages of their insurance plan.     Reyes Sharps, CPHT Pharmacy Technician Patient Advocate Specialist Lead [facility] Patient Advocate Team Direct Number: [phone]  Fax: [phone]

## 2024-08-27 NOTE — Assessment & Plan Note (Addendum)
-   patient has history of CKD3a. Baseline creat ~ 1.2 - 1.4, eGFR~ 50-55

## 2024-08-27 NOTE — Progress Notes (Signed)
 PHARMACY - ANTICOAGULATION  Pharmacy Consult for Heparin  Indication: chest pain/ACS Brief A/P: Heparin  level supratherapeutic Decrease Heparin  rate  Allergies  Allergen Reactions   Metformin  And Related Other (See Comments)    Pt feels that this causes constipation and will not take     Patient Measurements: Height: 5' 5 (165.1 cm) Weight: 74.8 kg (165 lb) IBW/kg (Calculated) : 61.5 HEPARIN  DW (KG): 74.8  Vital Signs: Temp: 97.9 F (36.6 C) (12/02 0404) Temp Source: Oral (12/02 0404) BP: 140/77 (12/02 0404) Pulse Rate: 50 (12/02 0404)  Labs: Recent Labs    08/26/24 0408 08/26/24 0608 08/27/24 0306  HGB 14.8  --  15.5  HCT 46.4  --  47.4  PLT 150  --  146*  HEPARINUNFRC  --   --  0.78*  CREATININE 1.29*  --  1.33*  TROPONINIHS 61* 55*  --     Estimated Creatinine Clearance: 46 mL/min (A) (by C-G formula based on SCr of 1.33 mg/dL (H)).  Assessment: 73 y.o. male with chest pain for heparin    Goal of Therapy:  Heparin  level 0.3-0.7 units/ml Monitor platelets by anticoagulation protocol: Yes   Plan:  Decrease Heparin  950 units/hr  Cathlyn Arrant, PharmD, BCPS  08/27/2024,4:34 AM

## 2024-08-28 ENCOUNTER — Other Ambulatory Visit: Payer: Self-pay | Admitting: Student

## 2024-08-28 ENCOUNTER — Other Ambulatory Visit (HOSPITAL_COMMUNITY): Payer: Self-pay

## 2024-08-28 ENCOUNTER — Telehealth (HOSPITAL_COMMUNITY): Payer: Self-pay

## 2024-08-28 DIAGNOSIS — I2510 Atherosclerotic heart disease of native coronary artery without angina pectoris: Secondary | ICD-10-CM

## 2024-08-28 DIAGNOSIS — E7800 Pure hypercholesterolemia, unspecified: Secondary | ICD-10-CM

## 2024-08-28 DIAGNOSIS — I5020 Unspecified systolic (congestive) heart failure: Secondary | ICD-10-CM

## 2024-08-28 DIAGNOSIS — R079 Chest pain, unspecified: Secondary | ICD-10-CM | POA: Diagnosis not present

## 2024-08-28 DIAGNOSIS — I255 Ischemic cardiomyopathy: Secondary | ICD-10-CM | POA: Diagnosis not present

## 2024-08-28 DIAGNOSIS — I5022 Chronic systolic (congestive) heart failure: Secondary | ICD-10-CM | POA: Diagnosis not present

## 2024-08-28 DIAGNOSIS — I25110 Atherosclerotic heart disease of native coronary artery with unstable angina pectoris: Secondary | ICD-10-CM

## 2024-08-28 DIAGNOSIS — I25119 Atherosclerotic heart disease of native coronary artery with unspecified angina pectoris: Secondary | ICD-10-CM | POA: Diagnosis not present

## 2024-08-28 LAB — CBC WITH DIFFERENTIAL/PLATELET
Abs Immature Granulocytes: 0.02 K/uL (ref 0.00–0.07)
Basophils Absolute: 0 K/uL (ref 0.0–0.1)
Basophils Relative: 0 %
Eosinophils Absolute: 0.3 K/uL (ref 0.0–0.5)
Eosinophils Relative: 3 %
HCT: 48.4 % (ref 39.0–52.0)
Hemoglobin: 16.2 g/dL (ref 13.0–17.0)
Immature Granulocytes: 0 %
Lymphocytes Relative: 11 %
Lymphs Abs: 1.1 K/uL (ref 0.7–4.0)
MCH: 29 pg (ref 26.0–34.0)
MCHC: 33.5 g/dL (ref 30.0–36.0)
MCV: 86.6 fL (ref 80.0–100.0)
Monocytes Absolute: 0.7 K/uL (ref 0.1–1.0)
Monocytes Relative: 7 %
Neutro Abs: 7.6 K/uL (ref 1.7–7.7)
Neutrophils Relative %: 79 %
Platelets: 167 K/uL (ref 150–400)
RBC: 5.59 MIL/uL (ref 4.22–5.81)
RDW: 12.5 % (ref 11.5–15.5)
WBC: 9.6 K/uL (ref 4.0–10.5)
nRBC: 0 % (ref 0.0–0.2)

## 2024-08-28 LAB — GLUCOSE, CAPILLARY
Glucose-Capillary: 332 mg/dL — ABNORMAL HIGH (ref 70–99)
Glucose-Capillary: 108 mg/dL — ABNORMAL HIGH (ref 70–99)

## 2024-08-28 LAB — BASIC METABOLIC PANEL WITH GFR
Anion gap: 7 (ref 5–15)
BUN: 12 mg/dL (ref 8–23)
CO2: 26 mmol/L (ref 22–32)
Calcium: 8.7 mg/dL — ABNORMAL LOW (ref 8.9–10.3)
Chloride: 105 mmol/L (ref 98–111)
Creatinine, Ser: 1.3 mg/dL — ABNORMAL HIGH (ref 0.61–1.24)
GFR, Estimated: 58 mL/min — ABNORMAL LOW (ref >60)
Glucose, Bld: 109 mg/dL — ABNORMAL HIGH (ref 70–99)
Potassium: 3.1 mmol/L — ABNORMAL LOW (ref 3.5–5.1)
Sodium: 138 mmol/L (ref 135–145)

## 2024-08-28 LAB — VITAMIN B12: Vitamin B-12: 231 pg/mL (ref 180–914)

## 2024-08-28 LAB — MAGNESIUM: Magnesium: 1.9 mg/dL (ref 1.7–2.4)

## 2024-08-28 MED ORDER — CARVEDILOL 3.125 MG PO TABS
3.1250 mg | ORAL_TABLET | Freq: Two times a day (BID) | ORAL | 2 refills | Status: AC
  Filled 2024-08-28: qty 60, 30d supply, fill #0

## 2024-08-28 MED ORDER — ROSUVASTATIN CALCIUM 40 MG PO TABS
40.0000 mg | ORAL_TABLET | Freq: Every day | ORAL | 3 refills | Status: AC
  Filled 2024-08-28: qty 90, 90d supply, fill #0

## 2024-08-28 MED ORDER — ISOSORBIDE MONONITRATE ER 30 MG PO TB24
30.0000 mg | ORAL_TABLET | Freq: Every day | ORAL | Status: DC
  Administered 2024-08-28: 30 mg via ORAL
  Filled 2024-08-28: qty 1

## 2024-08-28 MED ORDER — ISOSORBIDE MONONITRATE ER 30 MG PO TB24
30.0000 mg | ORAL_TABLET | Freq: Every day | ORAL | 2 refills | Status: AC
  Filled 2024-08-28: qty 90, 90d supply, fill #0

## 2024-08-28 MED ORDER — TICAGRELOR 90 MG PO TABS
90.0000 mg | ORAL_TABLET | Freq: Two times a day (BID) | ORAL | 3 refills | Status: AC
  Filled 2024-08-28: qty 60, 30d supply, fill #0

## 2024-08-28 MED ORDER — CARVEDILOL 3.125 MG PO TABS: 3.1250 mg | ORAL_TABLET | Freq: Two times a day (BID) | ORAL | Status: DC

## 2024-08-28 MED ORDER — POTASSIUM CHLORIDE CRYS ER 20 MEQ PO TBCR
40.0000 meq | EXTENDED_RELEASE_TABLET | Freq: Once | ORAL | Status: AC
  Administered 2024-08-28: 40 meq via ORAL
  Filled 2024-08-28: qty 2

## 2024-08-28 MED ORDER — NITROGLYCERIN 0.4 MG SL SUBL
0.4000 mg | SUBLINGUAL_TABLET | SUBLINGUAL | 3 refills | Status: AC | PRN
  Filled 2024-08-28: qty 25, 5d supply, fill #0

## 2024-08-28 MED ORDER — ASPIRIN 81 MG PO TBEC: 81.0000 mg | DELAYED_RELEASE_TABLET | Freq: Every day | ORAL | Status: AC

## 2024-08-28 MED ORDER — SACUBITRIL-VALSARTAN 24-26 MG PO TABS
1.0000 | ORAL_TABLET | Freq: Two times a day (BID) | ORAL | 3 refills | Status: AC
  Filled 2024-08-28: qty 60, 30d supply, fill #0

## 2024-08-28 MED ORDER — SACUBITRIL-VALSARTAN 24-26 MG PO TABS
1.0000 | ORAL_TABLET | Freq: Two times a day (BID) | ORAL | Status: DC
  Administered 2024-08-28: 1 via ORAL
  Filled 2024-08-28: qty 1

## 2024-08-28 MED ORDER — CYANOCOBALAMIN 1000 MCG/ML IJ SOLN
1000.0000 ug | Freq: Once | INTRAMUSCULAR | Status: AC
  Administered 2024-08-28: 1000 ug via INTRAMUSCULAR
  Filled 2024-08-28: qty 1

## 2024-08-28 NOTE — Progress Notes (Addendum)
 Progress Note  Patient Name: Barry [name] Date of Encounter: 08/28/2024  [facility] Cardiologist: Ozell Fell, MD   Patient Profile     Subjective   No complaints this morning.  Cath yesterday showed severe three-vessel disease: 40% ISR of proximal RCA stent and occluded distal RCA, 90% proximal PDA, occluded PLA, 20% proximal LAD, 9% proximal to mid LAD, 70% mid LAD, 90% distal LAD, occluded mid left circumflex, patent LIMA to mid LAD and SVG to OM 3, known chronically occluded SVG to RCA, SVG to the left circumflex and SVG to D1.  Status post PCI of the mid to distal RCA  Inpatient Medications    Scheduled Meds:  aspirin  EC  81 mg Oral Daily   cyanocobalamin   1,000 mcg Intramuscular Once   insulin  aspart  0-15 Units Subcutaneous TID WC   insulin  aspart  0-5 Units Subcutaneous QHS   insulin  glargine-yfgn  32 Units Subcutaneous Daily   losartan   25 mg Oral Daily   potassium chloride   40 mEq Oral Once   rosuvastatin   40 mg Oral QPM   sodium chloride  flush  3 mL Intravenous Q12H   ticagrelor  90 mg Oral BID   Continuous Infusions:  sodium chloride      PRN Meds: sodium chloride , acetaminophen , alum & mag hydroxide-simeth, hydrALAZINE , ondansetron  (ZOFRAN ) IV, sodium chloride  flush   Vital Signs    Vitals:   08/27/24 1448 08/27/24 2000 08/27/24 2347 08/28/24 0447  BP: (!) 144/76 125/86 116/71 119/79  Pulse: 73  70 70  Resp: 12 16 16 16   Temp: 99.2 F (37.3 C) 98.9 F (37.2 C) 98.8 F (37.1 C)   TempSrc: Oral Oral Oral Oral  SpO2: 98% 98% 97% 98%  Weight:    72.5 kg  Height:        Intake/Output Summary (Last 24 hours) at 08/28/2024 0821 Last data filed at 08/27/2024 1743 Gross per 24 hour  Intake 500 ml  Output --  Net 500 ml      08/28/2024    4:47 AM 08/27/2024    5:00 AM 08/26/2024    3:53 AM  Last 3 Weights  Weight (lbs) 159 lb 14.4 oz 163 lb 5.8 oz 165 lb  Weight (kg) 72.53 kg 74.1 kg 74.844 kg      Telemetry    Normal sinus rhythm-  Personally Reviewed  ECG    No new EKG- Personally Reviewed  Physical Exam   GEN: No acute distress.   Neck: No JVD Cardiac: RRR, no murmurs, rubs, or gallops.  Respiratory: Clear to auscultation bilaterally. GI: Soft, nontender, non-distended  MS: No edema; No deformity.  Left radial artery cath site clean dry with no hematoma and +2 radial pulse Neuro:  Nonfocal  Psych: Normal affect   Labs    High Sensitivity Troponin:   Recent Labs  Lab 08/26/24 0408 08/26/24 0608  TROPONINIHS 61* 55*      Chemistry Recent Labs  Lab 08/26/24 0408 08/27/24 0306 08/27/24 1144 08/27/24 1155 08/28/24 0439  NA 138 140 139 138 138  K 4.2 3.5 3.3* 3.2* 3.1*  CL 101 105  --   --  105  CO2 28 27  --   --  26  GLUCOSE 295* 142*  --   --  109*  BUN 19 16  --   --  12  CREATININE 1.29* 1.33*  --   --  1.30*  CALCIUM  8.5* 8.7*  --   --  8.7*  PROT 5.9*  --   --   --   --  ALBUMIN  3.2*  --   --   --   --   AST 18  --   --   --   --   ALT 11  --   --   --   --   ALKPHOS 80  --   --   --   --   BILITOT 0.6  --   --   --   --   GFRNONAA 58* 56*  --   --  58*  ANIONGAP 9 8  --   --  7     Hematology Recent Labs  Lab 08/26/24 0408 08/27/24 0306 08/27/24 1144 08/27/24 1155 08/28/24 0439  WBC 10.5 10.4  --   --  9.6  RBC 5.18 5.47  --   --  5.59  HGB 14.8 15.5 16.3 15.6 16.2  HCT 46.4 47.4 48.0 46.0 48.4  MCV 89.6 86.7  --   --  86.6  MCH 28.6 28.3  --   --  29.0  MCHC 31.9 32.7  --   --  33.5  RDW 12.2 12.3  --   --  12.5  PLT 150 146*  --   --  167    BNP Recent Labs  Lab 08/26/24 0408  BNP 88.3     DDimer No results for input(s): DDIMER in the last 168 hours.    Radiology    CARDIAC CATHETERIZATION Addendum Date: 08/27/2024 Cardiac Catheterization 08/27/24: Hemodynamic data: Right heart: RA 13/10, mean 10 mmHg RV 31/9, EDP 12 mmHg PA 26/17, mean 22 mmHg, PA saturation 61%. PW 17/17, mean 17 mmHg.  AO saturation 94%. QP/QS 1.00.  CO 3.49, CI 1.91 by Fick.  PVR  1.43 Wood units.  Papi 0.9. Left heart: LV 171/10, EDP 25 mmHg.  Ao 160/82, mean 116 mmHg.  There is no pressure gradient across the aortic valve. Angiographic data: LM: Mild calcification evident. LCx: Gives a very large vessel, it is occluded in the midsegment at the prior stent.  There is a large collateral from the proximal CX that collateralizes the distal large OM 3.  TIMI I flow is evident in this branch via collaterals.  Faint collaterals also evident to the distal RCA. LAD: Large vessel in the proximal segment however diffusely diseased distally.  Gives origin to moderate-sized D1 with a high-grade 95% mid stenosis and after D1 95% stenosis in the LAD followed by a mid to distal LAD 70% and apical LAD 90% stenosis unchanged from prior cardiac catheterization. LIMA to LAD is widely patent. RCA: Large-caliber vessel, previously placed proximal RCA stent has a 40% ISR.  This is unchanged.  The RCA is occluded in the mid to distal segment. (Vein graft to RCA, CX, D1 are known occluded vessels). Interventional Data: Successful PCI to mid to distal RCA with implantation of a 3.5 x 16 mm Synergy XD DES postdilated with 3.5 x 12 mm [place] balloon at 20 atmospheric pressure, stenosis reduced from 100% to 0% with TIMI 0 to TIMI-3 flow.  A small to moderate-sized secondary PL branch remained occluded with thrombus with TIMI I flow.  Otherwise rest of the vessels were widely patent. Impression and recommendations: Suspect his elevated troponin is related to unstable angina with new occlusion of the mid to distal RCA which is very large.  Probably significant elevation of troponin not evident due to faint collaterals from the CX to distal RCA.  Aspirin  and eventually changed go to Plavix  indefinitely and Brilinta for 1 year, Aggrastat for  6 hours.  Result Date: 08/27/2024 Images from the original result were not included. Cardiac Catheterization 08/27/24: Hemodynamic data: Right heart: RA 13/10, mean 10 mmHg RV 31/9, EDP  12 mmHg PA 26/17, mean 22 mmHg, PA saturation 61%. PW 17/17, mean 17 mmHg.  AO saturation 94%. QP/QS 1.00.  CO 3.49, CI 1.91 by Fick.  PVR 1.43 Wood units.  Papi 0.9. Left heart: LV 171/10, EDP 25 mmHg.  Ao 160/82, mean 116 mmHg.  There is no pressure gradient across the aortic valve. Angiographic data: LM: Mild calcification evident. LCx: Gives a very large vessel, it is occluded in the midsegment at the prior stent.  There is a large collateral from the proximal CX that collateralizes the distal large OM 3.  TIMI I flow is evident in this branch via collaterals.  Faint collaterals also evident to the distal RCA. LAD: Large vessel in the proximal segment however diffusely diseased distally.  Gives origin to moderate-sized D1 with a high-grade 95% mid stenosis and after D1 95% stenosis in the LAD followed by a mid to distal LAD 70% and apical LAD 90% stenosis unchanged from prior cardiac catheterization. LIMA to LAD is widely patent. RCA: Large-caliber vessel, previously placed proximal RCA stent has a 40% ISR.  This is unchanged.  The RCA is occluded in the mid to distal segment. Successful PCI to mid to distal RCA with implantation of a 3.5 x 16 mm Synergy XD DES postdilated with 3.5 x 12 mm [place] balloon at 20 atmospheric pressure, stenosis reduced from 100% to 0% with TIMI 0 to TIMI-3 flow.  A small to moderate-sized secondary PL branch remained occluded with thrombus with TIMI I flow.  Otherwise rest of the vessels were widely patent. Impression and recommendations: Suspect his elevated troponin is related to unstable angina with new occlusion of the mid to distal RCA which is very large.  Probably significant elevation of troponin not evident due to faint collaterals from the CX to distal RCA.  Aspirin  and eventually changed go to Plavix  indefinitely and Brilinta for 1 year, Aggrastat for 6 hours.   ECHOCARDIOGRAM COMPLETE Result Date: 08/26/2024    ECHOCARDIOGRAM REPORT   Patient Name:   SLATER MCMANAMAN  Date of  Exam: 08/26/2024 Medical Rec #:  [id number]  Height:       65.0 in Accession #:    [id number] Weight:       165.0 lb Date of Birth:  1951/02/08   BSA:          1.823 m Patient Age:    73 years   BP:           153/89 mmHg Patient Gender: M          HR:           53 bpm. Exam Location:  Inpatient Procedure: 2D Echo, Cardiac Doppler, Color Doppler and Intracardiac            Opacification Agent (Both Spectral and Color Flow Doppler were            utilized during procedure). Indications:    R07.9* Chest pain, unspecified  History:        Patient has prior history of Echocardiogram examinations, most                 recent 07/12/2021. Abnormal ECG and Prior CABG, Stroke and TIA,                 Arrythmias:Bradycardia and PVC, Signs/Symptoms:Chest Pain,  Shortness of Breath and Dyspnea; Risk Factors:Hypertension,                 Dyslipidemia and Diabetes.  Sonographer:    Ellouise Mose RDCS Referring Phys: [id number] RONDELL A SMITH IMPRESSIONS  1. Left ventricular ejection fraction, by estimation, is 40 to 45%. The left ventricle has mildly decreased function. The left ventricle demonstrates regional wall motion abnormalities (see scoring diagram/findings for description). There is moderate asymmetric left ventricular hypertrophy of the septal segment. Left ventricular diastolic parameters are consistent with Grade I diastolic dysfunction (impaired relaxation).  2. Right ventricular systolic function is mildly reduced. The right ventricular size is normal. There is normal pulmonary artery systolic pressure.  3. The mitral valve is degenerative. Trivial mitral valve regurgitation. No evidence of mitral stenosis.  4. The aortic valve is tricuspid. There is moderate calcification of the aortic valve. Aortic valve regurgitation is trivial. Aortic valve sclerosis is present, with no evidence of aortic valve stenosis.  5. The inferior vena cava is normal in size with greater than 50% respiratory variability, suggesting  right atrial pressure of 3 mmHg. Comparison(s): Changes from prior study are noted. Last chest wall echo in 2022 was prior to CABG; EF overall is similar, but wall motion abnormalities are now as noted. Conclusion(s)/Recommendation(s): No left ventricular mural or apical thrombus/thrombi. FINDINGS  Left Ventricle: Left ventricular ejection fraction, by estimation, is 40 to 45%. The left ventricle has mildly decreased function. The left ventricle demonstrates regional wall motion abnormalities. Definity contrast agent was given IV to delineate the left ventricular endocardial borders. The left ventricular internal cavity size was normal in size. There is moderate asymmetric left ventricular hypertrophy of the septal segment. Left ventricular diastolic parameters are consistent with Grade I diastolic dysfunction (impaired relaxation).  LV Wall Scoring: The posterior wall and basal inferior segment are akinetic. The mid and distal anterior wall, antero-lateral wall, mid and distal inferior wall, apical lateral segment, and basal inferoseptal segment are hypokinetic. The entire anterior septum, mid inferoseptal segment, basal anterior segment, and apex are normal. Right Ventricle: The right ventricular size is normal. No increase in right ventricular wall thickness. Right ventricular systolic function is mildly reduced. There is normal pulmonary artery systolic pressure. The tricuspid regurgitant velocity is 2.28 m/s, and with an assumed right atrial pressure of 3 mmHg, the estimated right ventricular systolic pressure is 23.8 mmHg. Left Atrium: Left atrial size was normal in size. Right Atrium: Right atrial size was normal in size. Pericardium: There is no evidence of pericardial effusion. Mitral Valve: The mitral valve is degenerative in appearance. Trivial mitral valve regurgitation. No evidence of mitral valve stenosis. Tricuspid Valve: The tricuspid valve is normal in structure. Tricuspid valve regurgitation is  mild . No evidence of tricuspid stenosis. Aortic Valve: The aortic valve is tricuspid. There is moderate calcification of the aortic valve. Aortic valve regurgitation is trivial. Aortic valve sclerosis is present, with no evidence of aortic valve stenosis. Pulmonic Valve: The pulmonic valve was grossly normal. Pulmonic valve regurgitation is trivial. No evidence of pulmonic stenosis. Aorta: The aortic root and ascending aorta are structurally normal, with no evidence of dilitation. Venous: The inferior vena cava is normal in size with greater than 50% respiratory variability, suggesting right atrial pressure of 3 mmHg. IAS/Shunts: No atrial level shunt detected by color flow Doppler.  LEFT VENTRICLE PLAX 2D LVIDd:         4.90 cm     Diastology LVIDs:  4.20 cm     LV e' medial:    4.90 cm/s LV PW:         1.20 cm     LV E/e' medial:  11.7 LV IVS:        1.60 cm     LV e' lateral:   8.81 cm/s LVOT diam:     2.30 cm     LV E/e' lateral: 6.5 LV SV:         73 LV SV Index:   40 LVOT Area:     4.15 cm  LV Volumes (MOD) LV vol d, MOD A2C: 90.7 ml LV vol d, MOD A4C: 75.9 ml LV vol s, MOD A2C: 49.2 ml LV vol s, MOD A4C: 35.9 ml LV SV MOD A2C:     41.5 ml LV SV MOD A4C:     75.9 ml LV SV MOD BP:      40.3 ml RIGHT VENTRICLE            IVC RV S prime:     6.74 cm/s  IVC diam: 1.40 cm TAPSE (M-mode): 1.6 cm                            PULMONARY VEINS                            Diastolic Velocity: 34.70 cm/s                            S/D Velocity:       1.40                            Systolic Velocity:  49.30 cm/s LEFT ATRIUM             Index        RIGHT ATRIUM           Index LA diam:        3.80 cm 2.08 cm/m   RA Area:     12.30 cm LA Vol (A2C):   34.1 ml 18.71 ml/m  RA Volume:   26.00 ml  14.26 ml/m LA Vol (A4C):   31.7 ml 17.39 ml/m LA Biplane Vol: 35.0 ml 19.20 ml/m  AORTIC VALVE LVOT Vmax:   78.60 cm/s LVOT Vmean:  50.800 cm/s LVOT VTI:    0.176 m  AORTA Ao Root diam: 3.60 cm Ao Asc diam:  3.60 cm MITRAL  VALVE               TRICUSPID VALVE MV Area (PHT): 2.54 cm    TR Peak grad:   20.8 mmHg MV Decel Time: 299 msec    TR Vmax:        228.00 cm/s MV E velocity: 57.45 cm/s MV A velocity: 76.50 cm/s  SHUNTS MV E/A ratio:  0.75        Systemic VTI:  0.18 m                            Systemic Diam: 2.30 cm Shelda Bruckner MD Electronically signed by Shelda Bruckner MD Signature Date/Time: 08/26/2024/4:01:25 PM    Final     Patient Profile     73 y.o. male  with a hx of CAD s/p CABG x  4 (LIMA-LAD, SVG-PDA, SVG-first diagonal, SVG-OM) in 2022, hyperlipidemia, chronic CHF with mildly reduced ejection fraction, chronic SMA dissection, PAD with penetrating ulcer in the left common iliac artery, history of stroke in 2011, diabetes, CKD 3a, hypertension, AAA who is being seen 08/26/2024 for the evaluation of chest pain at the request of Dr. Claudene.   Assessment & Plan    Chest pain ASCAD HLD -status post remote CABG with LIMA to LAD, SVG to PDA, SVG to D1, SVG to OM in 2022 -Repeat cath 07/01/2022: Severe three-vessel disease with patent SVG to PDA, SVG to OM1, SVG to D1 and LIMA to LAD with 70% sequential stenosis of the mid to distal LAD persisting -Presented with somewhat atypical chest pain in description and that it was burning and radiated into his abdomen - 2D echo no significant change from 2024 - Cath yesterday with severe three-vessel disease: 40% ISR of proximal RCA stent and occluded distal RCA, 90% proximal PDA, occluded PLA, 20% proximal LAD, 9% proximal to mid LAD, 70% mid LAD, 90% distal LAD, occluded mid left circumflex, patent LIMA to mid LAD and SVG to OM 3, known chronically occluded SVG to RCA, SVG to the left circumflex and SVG to D1.  Status post PCI of the mid to distal RCA -Felt that significant elevation in troponin was not evident due to faint collaterals from the left circumflex to distal RCA. -Now on DAPT with Brilinta  90 mg twice daily for at least a year and aspirin  81 mg  daily -Continue aspirin  81 mg daily, Brilinta  90 mg twice daily and Crestor  40 mg daily -Heart rate is improved Cleere we will start low-dose carvedilol  3.125 mg twice daily -Restart PTA Imdur  30 mg daily for residual CAD -LDL goal less than 55 -Lipids this admission: LDL 148, HDL 32 and triglycerides 112>> suspect he has not been taking his statin as well -Will need repeat FLP and ALT as an outpatient 6 weeks    Chronic HFrEF Ischemic dilated cardiomyopathy - 2D echo in 2022 showed EF 40-45 % with posterior and mid anterior lateral hypokinesis and G1 DD - Dispense report shows medical noncompliance and was supposed to be on Lopressor  25 mg twice daily and losartan  25 mg daily as well as Imdur  30 mg daily but does not appear that he has refilled these recently - BNP normal this admission - Chest x-ray with mild prominent interstitial opacities - Repeat 2D echo this admission shows persistence of EF 40 to 45% with similar focal wall motion abnormalities but possibly new wall motion abnormalities in the mid and distal inferior wall, apical lateral and basal inferior septal walls - Appears euvolemic on exam today pulmonary capillary wedge mean 17 mmHg at cath - No beta-blocker due to bradycardia - SCr stable at 1.3 - Continue GDMT: Transition losartan  to Entresto  24-26 mg twice daily.  Start carvedilol  3.125 mg twice daily - Consider addition of spironolactone outpatient if renal function remained stable - Not a candidate for SGLT2i given diabetes with insulin  - Check BMP on Friday 12/5  Hypokalemia - Potassium 3.1 this morning - Will replete to keep greater than 4  [facility] will sign off.   The patient is ready for discharge today from a cardiac standpoint. Medication Recommendations: Aspirin  81 mg daily, carvedilol  3.125 mg twice daily, Imdur  30 mg daily, Crestor  40 mg daily, Entresto  24-26 mg twice daily, Brilinta  90 mg twice daily Other recommendations (labs, testing, etc):  Bmet on 12/5; FLP and ALT in 6  weeks Follow up as an outpatient: Follow-up in clinic in 7 to 10 days with extender  For questions or updates, please contact [facility] Please consult [email or web address] for contact info under        Signed, Wilbert Bihari, MD  08/28/2024, 8:21 AM

## 2024-08-28 NOTE — Progress Notes (Addendum)
 CARDIAC REHAB PHASE I   PRE:  Rate/Rhythm: 71 SR    BP: sitting 138/79    SpO2:   MODE:  Ambulation: 400 ft   POST:  Rate/Rhythm: 101 ST    BP: sitting 165/78     SpO2:    Pt tolerated well, no c/o.  Discussed with pt and son (son interpreted some, previously declined interpreter), stent, Brilinta importance, diet, exercise, NTG, and CRPII. Pt somewhat receptive. Son voices that he has been hesitant to meds and loves rice. Will refer to [facility] CRPII.  [id number]  Aliene Aris BS, ACSM-CEP 08/28/2024 10:21 AM

## 2024-08-28 NOTE — Discharge Instructions (Signed)

## 2024-08-28 NOTE — Telephone Encounter (Signed)
 Pharmacy Patient Advocate Encounter  Insurance verification completed.    The patient is insured through [employer] [facility] [employer] Patient has Medicare and is not eligible for a copay card, but may be able to apply for patient assistance or Medicare RX Payment Plan (Patient Must reach out to their plan, if eligible for payment plan), if available.    Ran test claim for Bidil 20-37.5mg  tablet and the current 30 day co-pay is $0.   This test claim was processed through [employer]- copay amounts may vary at other pharmacies due to [employer], or as the patient moves through the different stages of their insurance plan.

## 2024-08-28 NOTE — Discharge Summary (Signed)
 Physician Discharge Summary   Akshith Moncus FMW:[id number] DOB: 02-Sep-1951 DOA: 08/26/2024  PCP: Watt Harlene BROCKS, MD  Admit date: 08/26/2024 Discharge date: 08/28/2024  Admitted From: Home Disposition: Home Discharging physician: Alm Apo, MD Barriers to discharge: None  Recommendations at discharge: Follow-up tolerance of low-dose Coreg  in regards to heart rate Follow-up compliance of all GDMT as patient had compliance issues prior to hospitalization   Discharge Condition: stable CODE STATUS: Full Diet recommendation:  Diet Orders (From admission, onward)     Start     Ordered   08/28/24 0000  Diet - low sodium heart healthy        08/28/24 1132   08/27/24 1305  Diet Heart Room service appropriate? Yes; Fluid consistency: Thin  Diet effective now       Question Answer Comment  Room service appropriate? Yes   Fluid consistency: Thin      08/27/24 1304            Hospital Course: Ms. Barry [name] is a 73 yo male with PMH CAD s/p CABG 2022, CHF, TIA/CVA, DMII, mesenteric artery dissection who presented with chest pain and numbness in legs. Chest pain awoke patient from sleep at onset.  He was admitted for further chest pain workup and cardiology evaluation.  Assessment and Plan: * Chest pain - awoke patient from sleep at onset; known CAD with underlying prior known nonobs mid RCA plaque noted on 06/2022 cath; admitted for repeat cath in case of progression of lesion contributing to CP - trop 61 and 55 on admission - started on heparin  drip - underwent L/R heart cath on 12/2; found to have worsening stenosis involving mid to distal RCA and underwent DES placement - Aspirin  and Brilinta recommended per cardiology-minimum 1 year  Elevated troponin - See chest pain workup  Coronary artery disease - s/p CABG 2022; repeat cath 2023 (med management after) - LDL 148.  Continue statin --See chest pain workup above.  Continue aspirin  and Brilinta.  - Also started on low-dose  Coreg  prior to discharge along with continuing Imdur .  Entresto initiated also - consideration of spironolactone at outpt follow up also   Uncontrolled type 2 diabetes mellitus with hyperglycemia, with long-term current use of insulin  (HCC) - A1c 9.1% - continued on home regimen at discharge  Acute on chronic heart failure with mildly reduced ejection fraction (HFmrEF) (HCC) - No obvious signs of volume overload on exam; CXR appears clear as well - BNP 88 -Echo repeated on admission, EF 40 to 45%, LV RWMA.  Grade 1 DD.  Mildly reduced RV systolic function   Hypertension - see med modifications in setting of GDMT  Bradycardia- beta blocker decreased - noted hx of bradycardia on BB - Heart rate controlled and patient started on low-dose Coreg  per cardiology at discharge.  Follow-up tolerance  Dyslipidemia, goal LDL below 70 - LDL 148 - Continue statin  History of CVA in adulthood - continue asa and statin  - check B12 given complaints of LE paresthesias - B12 level 231.  Given injection during hospitalization  CKD (chronic kidney disease) stage 3, GFR 30-59 ml/min (HCC) - patient has history of CKD3a. Baseline creat ~ 1.2 - 1.4, eGFR~ 50-55   The patient's acute and chronic medical conditions were treated accordingly. On day of discharge, patient was felt deemed stable for discharge. Patient/family member advised to call PCP or come back to ER if needed.   Principal Diagnosis: Chest pain  Discharge Diagnoses: Active Hospital Problems   Diagnosis  Date Noted   Chest pain 08/26/2024    Priority: 1.   Elevated troponin 08/26/2024    Priority: 1.   Coronary artery disease 02/28/2014    Priority: 2.   Uncontrolled type 2 diabetes mellitus with hyperglycemia, with long-term current use of insulin  (HCC) 08/26/2024    Priority: 3.   Acute on chronic heart failure with mildly reduced ejection fraction (HFmrEF) (HCC) 02/28/2014    Priority: 3.   Hypertension 11/10/2011     Priority: 4.   Bradycardia- beta blocker decreased 02/28/2014    Priority: 5.   Dyslipidemia, goal LDL below 70 02/28/2014    Priority: 6.   History of CVA in adulthood 11/10/2011    Priority: 7.   Ischemic cardiomyopathy 08/26/2024   CKD (chronic kidney disease) stage 3, GFR 30-59 ml/min (HCC) 07/21/2021    Resolved Hospital Problems  No resolved problems to display.     Discharge Instructions     Amb Referral to Cardiac Rehabilitation   Complete by: As directed    Diagnosis:  Coronary Stents PTCA     After initial evaluation and assessments completed: Virtual Based Care may be provided alone or in conjunction with Phase 2 Cardiac Rehab based on patient barriers.: Yes   Intensive Cardiac Rehabilitation (ICR) [facility] location only OR Traditional Cardiac Rehabilitation (TCR) *If criteria for ICR are not met will enroll in TCR [facility] only): Yes   Diet - low sodium heart healthy   Complete by: As directed    Increase activity slowly   Complete by: As directed       Allergies as of 08/28/2024       Reactions   Metformin  And Related Other (See Comments)   Pt feels that this causes constipation and will not take         Medication List     STOP taking these medications    dapagliflozin  propanediol 5 MG Tabs tablet Commonly known as: Farxiga    losartan  25 MG tablet Commonly known as: COZAAR    metoprolol  tartrate 25 MG tablet Commonly known as: LOPRESSOR        TAKE these medications    aspirin  EC 81 MG tablet Take 1 tablet (81 mg total) by mouth daily. Swallow whole. Start taking on: August 29, 2024   Brilinta 90 MG Tabs tablet Generic drug: ticagrelor Take 1 tablet (90 mg total) by mouth 2 (two) times daily.   carvedilol  3.125 MG tablet Commonly known as: COREG  Take 1 tablet (3.125 mg total) by mouth 2 (two) times daily with a meal.   Entresto 24-26 MG Generic drug: sacubitril-valsartan Take 1 tablet by mouth 2 (two) times daily.   isosorbide  mononitrate  30 MG 24 hr tablet Commonly known as: IMDUR  Take 1 tablet (30 mg total) by mouth daily. You can take an additional half tablet as needed for chest pain   Lantus  SoloStar 100 UNIT/ML Solostar Pen Generic drug: insulin  glargine Inject 27-30 Units into the skin daily.   nitroGLYCERIN  0.4 MG SL tablet Commonly known as: NITROSTAT  Place 1 tablet (0.4 mg total) under the tongue every 5 (five) minutes as needed for chest pain.   rosuvastatin  40 MG tablet Commonly known as: CRESTOR  Take 1 tablet (40 mg total) by mouth daily.        Allergies  Allergen Reactions   Metformin  And Related Other (See Comments)    Pt feels that this causes constipation and will not take     Consultations: Cardiology  Procedures: 08/27/2024: Right and left heart  cath  Discharge Exam: BP 108/74 (BP Location: Right Arm)   Pulse 87   Temp 99.2 F (37.3 C) (Oral)   Resp 17   Ht 5' 5 (1.651 m)   Wt 72.5 kg   SpO2 98%   BMI 26.61 kg/m  Physical Exam Constitutional:      General: He is not in acute distress.    Appearance: Normal appearance.  HENT:     Head: Normocephalic and atraumatic.     Mouth/Throat:     Mouth: Mucous membranes are moist.  Eyes:     Extraocular Movements: Extraocular movements intact.  Cardiovascular:     Rate and Rhythm: Normal rate and regular rhythm.  Pulmonary:     Effort: Pulmonary effort is normal. No respiratory distress.     Breath sounds: Normal breath sounds. No wheezing.  Abdominal:     General: Bowel sounds are normal. There is no distension.     Palpations: Abdomen is soft.     Tenderness: There is no abdominal tenderness.  Musculoskeletal:        General: Normal range of motion.     Cervical back: Normal range of motion and neck supple.  Skin:    General: Skin is warm and dry.  Neurological:     General: No focal deficit present.     Mental Status: He is alert.  Psychiatric:        Mood and Affect: Mood normal.        Behavior: Behavior normal.       The results of significant diagnostics from this hospitalization (including imaging, microbiology, ancillary and laboratory) are listed below for reference.   Microbiology: No results found for this or any previous visit (from the past 240 hours).   Labs: BNP (last 3 results) Recent Labs    08/26/24 0408  BNP 88.3   Basic Metabolic Panel: Recent Labs  Lab 08/26/24 0408 08/27/24 0306 08/27/24 1144 08/27/24 1155 08/28/24 0439  NA 138 140 139 138 138  K 4.2 3.5 3.3* 3.2* 3.1*  CL 101 105  --   --  105  CO2 28 27  --   --  26  GLUCOSE 295* 142*  --   --  109*  BUN 19 16  --   --  12  CREATININE 1.29* 1.33*  --   --  1.30*  CALCIUM  8.5* 8.7*  --   --  8.7*  MG  --   --   --   --  1.9   Liver Function Tests: Recent Labs  Lab 08/26/24 0408  AST 18  ALT 11  ALKPHOS 80  BILITOT 0.6  PROT 5.9*  ALBUMIN  3.2*   Recent Labs  Lab 08/26/24 0408  LIPASE 32   No results for input(s): AMMONIA in the last 168 hours. CBC: Recent Labs  Lab 08/26/24 0408 08/27/24 0306 08/27/24 1144 08/27/24 1155 08/28/24 0439  WBC 10.5 10.4  --   --  9.6  NEUTROABS 8.3*  --   --   --  7.6  HGB 14.8 15.5 16.3 15.6 16.2  HCT 46.4 47.4 48.0 46.0 48.4  MCV 89.6 86.7  --   --  86.6  PLT 150 146*  --   --  167   Cardiac Enzymes: No results for input(s): CKTOTAL, CKMB, CKMBINDEX, TROPONINI in the last 168 hours. BNP: Invalid input(s): POCBNP CBG: Recent Labs  Lab 08/27/24 0616 08/27/24 1701 08/27/24 2127 08/28/24 0824 08/28/24 1215  GLUCAP 118* 181* 121* 332*  108*   D-Dimer No results for input(s): DDIMER in the last 72 hours. Hgb A1c Recent Labs    08/26/24 0408  HGBA1C 9.1*   Lipid Profile Recent Labs    08/26/24 0408  CHOL 202*  HDL 32*  LDLCALC 148*  TRIG 112  CHOLHDL 6.3   Thyroid  function studies No results for input(s): TSH, T4TOTAL, T3FREE, THYROIDAB in the last 72 hours.  Invalid input(s): FREET3 Anemia work up Recent Labs     08/28/24 0439  VITAMINB12 231   Urinalysis    Component Value Date/Time   COLORURINE STRAW (A) 02/10/2023 1145   APPEARANCEUR CLEAR 02/10/2023 1145   LABSPEC 1.020 02/10/2023 1145   PHURINE 7.0 02/10/2023 1145   GLUCOSEU >=500 (A) 02/10/2023 1145   HGBUR NEGATIVE 02/10/2023 1145   BILIRUBINUR negative 07/10/2023 1400   BILIRUBINUR negative 12/29/2022 1421   KETONESUR negative 07/10/2023 1400   KETONESUR NEGATIVE 02/10/2023 1145   PROTEINUR trace (A) 07/10/2023 1400   PROTEINUR NEGATIVE 02/10/2023 1145   UROBILINOGEN 0.2 07/10/2023 1400   UROBILINOGEN 0.2 01/12/2015 1121   NITRITE Negative 07/10/2023 1400   NITRITE NEGATIVE 02/10/2023 1145   LEUKOCYTESUR Negative 07/10/2023 1400   LEUKOCYTESUR NEGATIVE 02/10/2023 1145   Sepsis Labs Recent Labs  Lab 08/26/24 0408 08/27/24 0306 08/28/24 0439  WBC 10.5 10.4 9.6   Microbiology No results found for this or any previous visit (from the past 240 hours).  Procedures/Studies: CARDIAC CATHETERIZATION Addendum Date: 08/27/2024 Cardiac Catheterization 08/27/24: Hemodynamic data: Right heart: RA 13/10, mean 10 mmHg RV 31/9, EDP 12 mmHg PA 26/17, mean 22 mmHg, PA saturation 61%. PW 17/17, mean 17 mmHg.  AO saturation 94%. QP/QS 1.00.  CO 3.49, CI 1.91 by Fick.  PVR 1.43 Wood units.  Papi 0.9. Left heart: LV 171/10, EDP 25 mmHg.  Ao 160/82, mean 116 mmHg.  There is no pressure gradient across the aortic valve. Angiographic data: LM: Mild calcification evident. LCx: Gives a very large vessel, it is occluded in the midsegment at the prior stent.  There is a large collateral from the proximal CX that collateralizes the distal large OM 3.  TIMI I flow is evident in this branch via collaterals.  Faint collaterals also evident to the distal RCA. LAD: Large vessel in the proximal segment however diffusely diseased distally.  Gives origin to moderate-sized D1 with a high-grade 95% mid stenosis and after D1 95% stenosis in the LAD followed by a mid  to distal LAD 70% and apical LAD 90% stenosis unchanged from prior cardiac catheterization. LIMA to LAD is widely patent. RCA: Large-caliber vessel, previously placed proximal RCA stent has a 40% ISR.  This is unchanged.  The RCA is occluded in the mid to distal segment. (Vein graft to RCA, CX, D1 are known occluded vessels). Interventional Data: Successful PCI to mid to distal RCA with implantation of a 3.5 x 16 mm Synergy XD DES postdilated with 3.5 x 12 mm [place] balloon at 20 atmospheric pressure, stenosis reduced from 100% to 0% with TIMI 0 to TIMI-3 flow.  A small to moderate-sized secondary PL branch remained occluded with thrombus with TIMI I flow.  Otherwise rest of the vessels were widely patent. Impression and recommendations: Suspect his elevated troponin is related to unstable angina with new occlusion of the mid to distal RCA which is very large.  Probably significant elevation of troponin not evident due to faint collaterals from the CX to distal RCA.  Aspirin  and eventually changed go to Plavix  indefinitely and Brilinta  for 1 year, Aggrastat for 6 hours.  Result Date: 08/27/2024 Images from the original result were not included. Cardiac Catheterization 08/27/24: Hemodynamic data: Right heart: RA 13/10, mean 10 mmHg RV 31/9, EDP 12 mmHg PA 26/17, mean 22 mmHg, PA saturation 61%. PW 17/17, mean 17 mmHg.  AO saturation 94%. QP/QS 1.00.  CO 3.49, CI 1.91 by Fick.  PVR 1.43 Wood units.  Papi 0.9. Left heart: LV 171/10, EDP 25 mmHg.  Ao 160/82, mean 116 mmHg.  There is no pressure gradient across the aortic valve. Angiographic data: LM: Mild calcification evident. LCx: Gives a very large vessel, it is occluded in the midsegment at the prior stent.  There is a large collateral from the proximal CX that collateralizes the distal large OM 3.  TIMI I flow is evident in this branch via collaterals.  Faint collaterals also evident to the distal RCA. LAD: Large vessel in the proximal segment however diffusely  diseased distally.  Gives origin to moderate-sized D1 with a high-grade 95% mid stenosis and after D1 95% stenosis in the LAD followed by a mid to distal LAD 70% and apical LAD 90% stenosis unchanged from prior cardiac catheterization. LIMA to LAD is widely patent. RCA: Large-caliber vessel, previously placed proximal RCA stent has a 40% ISR.  This is unchanged.  The RCA is occluded in the mid to distal segment. Successful PCI to mid to distal RCA with implantation of a 3.5 x 16 mm Synergy XD DES postdilated with 3.5 x 12 mm  balloon at 20 atmospheric pressure, stenosis reduced from 100% to 0% with TIMI 0 to TIMI-3 flow.  A small to moderate-sized secondary PL branch remained occluded with thrombus with TIMI I flow.  Otherwise rest of the vessels were widely patent. Impression and recommendations: Suspect his elevated troponin is related to unstable angina with new occlusion of the mid to distal RCA which is very large.  Probably significant elevation of troponin not evident due to faint collaterals from the CX to distal RCA.  Aspirin  and eventually changed go to Plavix  indefinitely and Brilinta for 1 year, Aggrastat for 6 hours.   ECHOCARDIOGRAM COMPLETE Result Date: 08/26/2024    ECHOCARDIOGRAM REPORT   Patient Name:   RYNE MCTIGUE  Date of Exam: 08/26/2024 Medical Rec #:  [id number]  Height:       65.0 in Accession #:    [id number] Weight:       165.0 lb Date of Birth:  Apr 27, 1951   BSA:          1.823 m Patient Age:    73 years   BP:           153/89 mmHg Patient Gender: M          HR:           53 bpm. Exam Location:  Inpatient Procedure: 2D Echo, Cardiac Doppler, Color Doppler and Intracardiac            Opacification Agent (Both Spectral and Color Flow Doppler were            utilized during procedure). Indications:    R07.9* Chest pain, unspecified  History:        Patient has prior history of Echocardiogram examinations, most                 recent 07/12/2021. Abnormal ECG and Prior CABG, Stroke and TIA,                  Arrythmias:Bradycardia  and PVC, Signs/Symptoms:Chest Pain,                 Shortness of Breath and Dyspnea; Risk Factors:Hypertension,                 Dyslipidemia and Diabetes.  Sonographer:    Ellouise Mose RDCS Referring Phys: [id number] RONDELL A SMITH IMPRESSIONS  1. Left ventricular ejection fraction, by estimation, is 40 to 45%. The left ventricle has mildly decreased function. The left ventricle demonstrates regional wall motion abnormalities (see scoring diagram/findings for description). There is moderate asymmetric left ventricular hypertrophy of the septal segment. Left ventricular diastolic parameters are consistent with Grade I diastolic dysfunction (impaired relaxation).  2. Right ventricular systolic function is mildly reduced. The right ventricular size is normal. There is normal pulmonary artery systolic pressure.  3. The mitral valve is degenerative. Trivial mitral valve regurgitation. No evidence of mitral stenosis.  4. The aortic valve is tricuspid. There is moderate calcification of the aortic valve. Aortic valve regurgitation is trivial. Aortic valve sclerosis is present, with no evidence of aortic valve stenosis.  5. The inferior vena cava is normal in size with greater than 50% respiratory variability, suggesting right atrial pressure of 3 mmHg. Comparison(s): Changes from prior study are noted. Last chest wall echo in 2022 was prior to CABG; EF overall is similar, but wall motion abnormalities are now as noted. Conclusion(s)/Recommendation(s): No left ventricular mural or apical thrombus/thrombi. FINDINGS  Left Ventricle: Left ventricular ejection fraction, by estimation, is 40 to 45%. The left ventricle has mildly decreased function. The left ventricle demonstrates regional wall motion abnormalities. Definity contrast agent was given IV to delineate the left ventricular endocardial borders. The left ventricular internal cavity size was normal in size. There is moderate asymmetric left  ventricular hypertrophy of the septal segment. Left ventricular diastolic parameters are consistent with Grade I diastolic dysfunction (impaired relaxation).  LV Wall Scoring: The posterior wall and basal inferior segment are akinetic. The mid and distal anterior wall, antero-lateral wall, mid and distal inferior wall, apical lateral segment, and basal inferoseptal segment are hypokinetic. The entire anterior septum, mid inferoseptal segment, basal anterior segment, and apex are normal. Right Ventricle: The right ventricular size is normal. No increase in right ventricular wall thickness. Right ventricular systolic function is mildly reduced. There is normal pulmonary artery systolic pressure. The tricuspid regurgitant velocity is 2.28 m/s, and with an assumed right atrial pressure of 3 mmHg, the estimated right ventricular systolic pressure is 23.8 mmHg. Left Atrium: Left atrial size was normal in size. Right Atrium: Right atrial size was normal in size. Pericardium: There is no evidence of pericardial effusion. Mitral Valve: The mitral valve is degenerative in appearance. Trivial mitral valve regurgitation. No evidence of mitral valve stenosis. Tricuspid Valve: The tricuspid valve is normal in structure. Tricuspid valve regurgitation is mild . No evidence of tricuspid stenosis. Aortic Valve: The aortic valve is tricuspid. There is moderate calcification of the aortic valve. Aortic valve regurgitation is trivial. Aortic valve sclerosis is present, with no evidence of aortic valve stenosis. Pulmonic Valve: The pulmonic valve was grossly normal. Pulmonic valve regurgitation is trivial. No evidence of pulmonic stenosis. Aorta: The aortic root and ascending aorta are structurally normal, with no evidence of dilitation. Venous: The inferior vena cava is normal in size with greater than 50% respiratory variability, suggesting right atrial pressure of 3 mmHg. IAS/Shunts: No atrial level shunt detected by color flow  Doppler.  LEFT VENTRICLE PLAX 2D LVIDd:  4.90 cm     Diastology LVIDs:         4.20 cm     LV e' medial:    4.90 cm/s LV PW:         1.20 cm     LV E/e' medial:  11.7 LV IVS:        1.60 cm     LV e' lateral:   8.81 cm/s LVOT diam:     2.30 cm     LV E/e' lateral: 6.5 LV SV:         73 LV SV Index:   40 LVOT Area:     4.15 cm  LV Volumes (MOD) LV vol d, MOD A2C: 90.7 ml LV vol d, MOD A4C: 75.9 ml LV vol s, MOD A2C: 49.2 ml LV vol s, MOD A4C: 35.9 ml LV SV MOD A2C:     41.5 ml LV SV MOD A4C:     75.9 ml LV SV MOD BP:      40.3 ml RIGHT VENTRICLE            IVC RV S prime:     6.74 cm/s  IVC diam: 1.40 cm TAPSE (M-mode): 1.6 cm                            PULMONARY VEINS                            Diastolic Velocity: 34.70 cm/s                            S/D Velocity:       1.40                            Systolic Velocity:  49.30 cm/s LEFT ATRIUM             Index        RIGHT ATRIUM           Index LA diam:        3.80 cm 2.08 cm/m   RA Area:     12.30 cm LA Vol (A2C):   34.1 ml 18.71 ml/m  RA Volume:   26.00 ml  14.26 ml/m LA Vol (A4C):   31.7 ml 17.39 ml/m LA Biplane Vol: 35.0 ml 19.20 ml/m  AORTIC VALVE LVOT Vmax:   78.60 cm/s LVOT Vmean:  50.800 cm/s LVOT VTI:    0.176 m  AORTA Ao Root diam: 3.60 cm Ao Asc diam:  3.60 cm MITRAL VALVE               TRICUSPID VALVE MV Area (PHT): 2.54 cm    TR Peak grad:   20.8 mmHg MV Decel Time: 299 msec    TR Vmax:        228.00 cm/s MV E velocity: 57.45 cm/s MV A velocity: 76.50 cm/s  SHUNTS MV E/A ratio:  0.75        Systemic VTI:  0.18 m                            Systemic Diam: 2.30 cm Shelda Bruckner MD Electronically signed by Shelda Bruckner MD Signature Date/Time: 08/26/2024/4:01:25 PM    Final    DG Chest Port 1  View Result Date: 08/26/2024 EXAM: 1 VIEW(S) XRAY OF THE CHEST 08/26/2024 04:25:00 AM COMPARISON: AP radiograph of the chest dated 02/10/2023. CLINICAL HISTORY: chest pain FINDINGS: LUNGS AND PLEURA: There are mildly prominent  interstitial opacities. No pleural effusion. No pneumothorax. HEART AND MEDIASTINUM: No acute abnormality of the cardiac and mediastinal silhouettes. BONES AND SOFT TISSUES: No acute osseous abnormality. IMPRESSION: 1. Mildly prominent interstitial opacities. 2. Status post CABG. Electronically signed by: Evalene Coho MD 08/26/2024 04:29 AM EST RP Workstation: [id number]     Time coordinating discharge: Over 30 minutes    Alm Apo, MD  [facility] 08/28/2024, 4:54 PM

## 2024-08-28 NOTE — Progress Notes (Signed)
 FLP and LFT in 6 weeks per Dr Shlomo

## 2024-08-28 NOTE — Progress Notes (Signed)
BMET per Dr. Mayford Knife

## 2024-08-28 NOTE — TOC CM/SW Note (Addendum)
 Transition of Care [facility]) - Inpatient Brief Assessment   Patient Details  Name: Barry [name] MRN: [id number] Date of Birth: 10-Jun-1951  Transition of Care [facility]) CM/SW Contact:    Sudie Erminio Deems, RN Phone Number: 08/28/2024, 12:09 PM   Clinical Narrative: Patient presented for chest pain-post R/LHC. Patient's son is in the room and the son is interpreting for the patient- he has declined an interpreter. PTA Patient was from home alone. Son states he visits the patient often. Patient has PCP and son takes him to appointments. No home needs identified during the visit.  Transition of Care Asessment: Insurance and Status: Insurance coverage has been reviewed Patient has primary care physician: Yes Home environment has been reviewed: reviewed Prior level of function:: independent Prior/Current Home Services: No current home services Social Drivers of Health Review: SDOH reviewed no interventions necessary Readmission risk has been reviewed: Yes Transition of care needs: no transition of care needs at this time

## 2024-08-29 ENCOUNTER — Telehealth: Payer: Self-pay

## 2024-08-29 LAB — LIPOPROTEIN A (LPA): Lipoprotein (a): 211.8 nmol/L — ABNORMAL HIGH (ref <75.0)

## 2024-08-29 NOTE — Transitions of Care (Post Inpatient/ED Visit) (Signed)
 08/29/2024  Name: Barry [name] MRN: [id number] DOB: [birth year]  Today's TOC FU Call Status: Today's TOC FU Call Status:: Successful TOC FU Call Completed TOC FU Call Complete Date: 08/29/24  Patient's Name and Date of Birth confirmed. Name, DOB  Transition Care Management Follow-up Telephone Call Date of Discharge: 08/27/24 Discharge Facility: [facility]) Type of Discharge: Inpatient Admission Primary Inpatient Discharge Diagnosis:: Chest Pain How have you been since you were released from the hospital?: Better Any questions or concerns?: No  Items Reviewed: Did you receive and understand the discharge instructions provided?: Yes Medications obtained,verified, and reconciled?: Yes (Medications Reviewed) Any new allergies since your discharge?: No Dietary orders reviewed?: Yes Type of Diet Ordered:: Low sodium Heart healthy Do you have support at home?: Yes People in Home [RPT]: spouse Name of Support/Comfort Primary Source: Barry [name]  Medications Reviewed Today: Medications Reviewed Today     Reviewed by Moises Reusing, RN (Case Manager) on 08/29/24 at 1435  Med List Status: <None>   Medication Order Taking? Sig Documenting Provider Last Dose Status Informant  aspirin  EC 81 MG tablet [id number]  Take 1 tablet (81 mg total) by mouth daily. Swallow whole. Patsy Lenis, MD  Active   carvedilol  (COREG ) 3.125 MG tablet [id number]  Take 1 tablet (3.125 mg total) by mouth 2 (two) times daily with a meal. Patsy Lenis, MD  Active   insulin  glargine (LANTUS  SOLOSTAR) 100 UNIT/ML Solostar Pen [id number] No Inject 27-30 Units into the skin daily.  Patient not taking: Reported on 08/27/2024   Copland, Harlene BROCKS, MD Not Taking Active Child, Pharmacy Records  isosorbide  mononitrate (IMDUR ) 30 MG 24 hr tablet [id number]  Take 1 tablet (30 mg total) by mouth daily. You can take an additional half tablet as needed for chest pain Patsy Lenis, MD  Active   nitroGLYCERIN  (NITROSTAT )  0.4 MG SL tablet [id number]  Place 1 tablet (0.4 mg total) under the tongue every 5 (five) minutes as needed for chest pain. Patsy Lenis, MD  Active   rosuvastatin  (CRESTOR ) 40 MG tablet [id number]  Take 1 tablet (40 mg total) by mouth daily. Patsy Lenis, MD  Active   sacubitril -valsartan  (ENTRESTO ) 24-26 MG [id number]  Take 1 tablet by mouth 2 (two) times daily. Patsy Lenis, MD  Active   ticagrelor  (BRILINTA ) 90 MG TABS tablet [id number]  Take 1 tablet (90 mg total) by mouth 2 (two) times daily. Patsy Lenis, MD  Active             Home Care and Equipment/Supplies: Were Home Health Services Ordered?: NA Any new equipment or medical supplies ordered?: NA  Functional Questionnaire: Do you need assistance with bathing/showering or dressing?: No Do you need assistance with meal preparation?: No Do you need assistance with eating?: No Do you have difficulty maintaining continence: No Do you need assistance with getting out of bed/getting out of a chair/moving?: No Do you have difficulty managing or taking your medications?: No  Follow up appointments reviewed: PCP Follow-up appointment confirmed?: No (The patient's son (answers the phone due to language barrier) does not want to make an appointment until he sees the Cardiolgist) MD Provider Line [phone] Given: No Specialist Hospital Follow-up appointment confirmed?: Yes Date of Specialist follow-up appointment?: 09/06/24 Follow-Up Specialty Provider:: Dr. Ren Do you need transportation to your follow-up appointment?: No Do you understand care options if your condition(s) worsen?: Yes-patient verbalized understanding  SDOH Interventions Today    Flowsheet Row Most Recent Value  SDOH Interventions   Food Insecurity  Interventions Intervention Not Indicated  Housing Interventions Intervention Not Indicated  Transportation Interventions Intervention Not Indicated  Utilities Interventions Intervention Not Indicated     Medford Balboa, BSN, RN [place]  VBCI - Population Health RN Care Manager [phone]

## 2024-09-02 ENCOUNTER — Telehealth (HOSPITAL_COMMUNITY): Payer: Self-pay

## 2024-09-02 NOTE — Telephone Encounter (Signed)
 Per pt's son Pros, pt is not interested in the cardiac rehab program.   Closed referral.

## 2024-09-02 NOTE — Progress Notes (Signed)
 Barry [name]                                          MRN: [id number]   09/02/2024   The VBCI Quality Team Specialist reviewed this patient medical record for the purposes of chart review for care gap closure. The following were reviewed: chart review for care gap closure-diabetic eye exam and glycemic status assessment.     VBCI Quality Team

## 2024-09-03 ENCOUNTER — Telehealth: Payer: Self-pay

## 2024-09-03 DIAGNOSIS — E118 Type 2 diabetes mellitus with unspecified complications: Secondary | ICD-10-CM

## 2024-09-03 MED ORDER — DEXCOM G7 SENSOR MISC: 3 refills | Status: AC

## 2024-09-03 NOTE — Telephone Encounter (Signed)
 Copied from CRM #[id number]. Topic: Clinical - Medication Question >> Sep 02, 2024  4:03 PM Aisha D wrote: Reason for CRM: [name] stated that the pt is out of the dexcom and needs a refill. Pt would like a callback with an update, CB [phone].

## 2024-09-03 NOTE — Telephone Encounter (Signed)
 Rx sent.

## 2024-09-06 ENCOUNTER — Ambulatory Visit

## 2024-09-06 ENCOUNTER — Ambulatory Visit (HOSPITAL_COMMUNITY): Admission: RE | Admit: 2024-09-06 | Discharge: 2024-09-06 | Disposition: A | Source: Ambulatory Visit

## 2024-09-06 VITALS — BP 130/60 | HR 74 | Ht 65.0 in | Wt 163.0 lb

## 2024-09-06 DIAGNOSIS — I7779 Dissection of other artery: Secondary | ICD-10-CM

## 2024-09-06 DIAGNOSIS — I5020 Unspecified systolic (congestive) heart failure: Secondary | ICD-10-CM

## 2024-09-06 DIAGNOSIS — Z794 Long term (current) use of insulin: Secondary | ICD-10-CM | POA: Diagnosis not present

## 2024-09-06 DIAGNOSIS — I2510 Atherosclerotic heart disease of native coronary artery without angina pectoris: Secondary | ICD-10-CM | POA: Diagnosis not present

## 2024-09-06 DIAGNOSIS — I2584 Coronary atherosclerosis due to calcified coronary lesion: Secondary | ICD-10-CM

## 2024-09-06 DIAGNOSIS — E7800 Pure hypercholesterolemia, unspecified: Secondary | ICD-10-CM | POA: Diagnosis not present

## 2024-09-06 DIAGNOSIS — I10 Essential (primary) hypertension: Secondary | ICD-10-CM

## 2024-09-06 DIAGNOSIS — E1165 Type 2 diabetes mellitus with hyperglycemia: Secondary | ICD-10-CM | POA: Diagnosis not present

## 2024-09-06 DIAGNOSIS — E785 Hyperlipidemia, unspecified: Secondary | ICD-10-CM

## 2024-09-06 MED ORDER — IOHEXOL 350 MG/ML SOLN
80.0000 mL | Freq: Once | INTRAVENOUS | Status: AC | PRN
  Administered 2024-09-06: 80 mL via INTRAVENOUS

## 2024-09-06 NOTE — Progress Notes (Signed)
°  °  Cardiology Office Note Date:  09/06/2024  ID:  Barry [name], DOB 1951/05/14, MRN [id number] PCP:  Watt Harlene BROCKS, MD  Cardiologist:   Joelle VEAR Ren Donley, MD  No chief complaint on file.     Problems CAD s/p CABG x 4 (LAD, PDA, D1, OM) in 2022 LHC 12/25: Patent LIMA; chronically occluded other grafts; s/p PCI mid to distal RCA RHC 12/25: RA 10, RV 31/9, PA 26/17/22, PCWP 17 TTE 12/25: 40-45% w/ WMA Mesenteric artery dissection M: ASA81, TL90BID, CL3.125, ISMN30, SV 24-26, Insulin , RN40 L: LDL 12/25 148 (statin nonadherence?)  Visits  12/25: re-order labs; consider SE if Cr stable, add SGLT2i; CT angio abdomen/pelvis and vascular surgery referral; LP in 3 months    History of Present Illness: Barry [name] is a 73 y.o. male who presents for follow up.   He has been doing well since his PCI.  He denies any chest pain or shortness of breath.  He does have a little fatigue, but has been able to do his daily activities.  He denies any bleeding on antiplatelet therapy and reports adherence to his medication.  He does not check his blood pressure at home, but checks his blood sugar about once a day.  ROS: Please see the history of present illness. All other systems are reviewed and negative.   PHYSICAL EXAM: VS:  BP 130/60   Pulse 74   Ht 5' 5 (1.651 m)   Wt 163 lb (73.9 kg)   SpO2 97%   BMI 27.12 kg/m  , BMI Body mass index is 27.12 kg/m. GEN: Well nourished, well developed, in no acute distress HEENT: normal Neck: no JVD, carotid bruits, or masses Cardiac: RRR; no murmurs, rubs, or gallops,no edema  Respiratory:  CTAB bilaterally, normal work of breathing GI: soft, nontender, nondistended, + BS Extremities: No LE edema Skin: warm and dry, no rash Neuro:  Strength and sensation are intact  Recent Labs: Reviewed  Studies: Reviewed  ASSESSMENT AND PLAN: Barry [name] is a 73 y.o. male who presents for follow up. - Appears to be mostly asymptomatic from cardiac standpoint  since his PCI.  He is tolerating DAPT. - We will obtain BMP and mag today, and if unremarkable, can add either spiro and/or Jardiance given EF 40-45% - Repeat lipid panel, and further uptitrate GDMT in 3 months - Given history of mesenteric artery dissection, will repeat CT angio abdomen and pelvis and refer back to vascular surgery.   Signed, Joelle VEAR Ren Donley, MD  09/06/2024 2:14 PM    [facility]

## 2024-09-06 NOTE — Patient Instructions (Addendum)
 Medication Instructions:  NO CHANGES  *If you need a refill on your cardiac medications before your next appointment, please call your pharmacy*  Lab Work: BMET and Magnesium  TODAY on first floor  If you have labs (blood work) drawn today and your tests are completely normal, you will receive your results only by: MyChart Message (if you have MyChart) OR A paper copy in the mail If you have any lab test that is abnormal or we need to change your treatment, we will call you to review the results.  Testing/Procedures: CT angiogram abdomen/pelvis   Follow-Up: At [facility], you and your health needs are our priority.  As part of our continuing mission to provide you with exceptional heart care, our providers are all part of one team.  This team includes your primary Cardiologist (physician) and Advanced Practice Providers or APPs (Physician Assistants and Nurse Practitioners) who all work together to provide you with the care you need, when you need it.  Your next appointment:    3 months with PA or NP   ** call in March for this appointment    You have been referred to the vascular surgery team -- 4th floor of this building   We recommend signing up for the patient portal called MyChart.  Sign up information is provided on this After Visit Summary.  MyChart is used to connect with patients for Virtual Visits (Telemedicine).  Patients are able to view lab/test results, encounter notes, upcoming appointments, etc.  Non-urgent messages can be sent to your provider as well.   To learn more about what you can do with MyChart, go to [email or web address].   Other Instructions

## 2024-09-07 LAB — BASIC METABOLIC PANEL WITH GFR
BUN/Creatinine Ratio: 11 (ref 10–24)
BUN: 19 mg/dL (ref 8–27)
CO2: 24 mmol/L (ref 20–29)
Calcium: 9.4 mg/dL (ref 8.6–10.2)
Chloride: 99 mmol/L (ref 96–106)
Creatinine, Ser: 1.76 mg/dL — ABNORMAL HIGH (ref 0.76–1.27)
Glucose: 377 mg/dL — ABNORMAL HIGH (ref 70–99)
Potassium: 5 mmol/L (ref 3.5–5.2)
Sodium: 136 mmol/L (ref 134–144)
eGFR: 40 mL/min/1.73 — ABNORMAL LOW (ref >59)

## 2024-09-07 LAB — MAGNESIUM: Magnesium: 2.2 mg/dL (ref 1.6–2.3)

## 2024-09-09 ENCOUNTER — Ambulatory Visit: Payer: Self-pay

## 2024-09-09 DIAGNOSIS — N179 Acute kidney failure, unspecified: Secondary | ICD-10-CM

## 2024-09-17 NOTE — Telephone Encounter (Signed)
" °  Joelle VEAR Ren Donley, MD 09/09/2024  9:35 AM EST     Hello,       Can we please reach out to this patient and let him know that he has an acute kidney injury and needs to hold his Entresto  and order BMP/Mag to be done in 2 weeks?   Joelle VEAR Ren Donley, MD @TD  9:33 AM    Call placed using [employer]- Gave the information above. Even spelled out the medication for them. Since this is actually one week from when this message was sent, I told the patient to get lab work one week from today (that would equal 2 weeks) Informed he needs to get lab work on 09/24/24- he will come to the Lab on the first floor at [facility].  The patient and the pt's son were both on the phone- actually several family members were in the background/on speaker-phone. The son speaks English well.   They verbalized understanding and informed that we will call after results of lab work are received.  "

## 2024-10-07 ENCOUNTER — Ambulatory Visit: Attending: Surgery | Admitting: Surgery

## 2024-10-07 ENCOUNTER — Encounter: Payer: Self-pay | Admitting: Surgery

## 2024-10-07 ENCOUNTER — Other Ambulatory Visit (HOSPITAL_COMMUNITY): Payer: Self-pay

## 2024-10-07 VITALS — BP 157/76 | HR 62 | Temp 98.2°F | Resp 18 | Ht 65.0 in | Wt 161.8 lb

## 2024-10-07 DIAGNOSIS — I7779 Dissection of other artery: Secondary | ICD-10-CM | POA: Diagnosis not present

## 2024-10-07 NOTE — Progress Notes (Signed)
 "                                   Vascular and Vein Specialist of [facility]  Patient name: Barry [name] MRN: [id number] DOB: 08-05-1951 Sex: male   REQUESTING PROVIDER:    Azobou Tonleu   REASON FOR CONSULT:    SAM dissection  HISTORY OF PRESENT ILLNESS:   Barry [name] is a 74 y.o. male, who is referred back for follow-up of recent imaging.  I initially met him in 2013 for abdominal pain.  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  At that time he was also diagnosed with epiploic appendagitis.  Ultimately his symptoms resolved.  I have not seen him since 2019.  He was seen in the hospital by Dr. Lanis in 2024 with complaints of abdominal pain that was related to nephrolithiasis  He is here today for follow-up of a recent CT scan.  He denies any abdominal pain.  He does not have postprandial abdominal pain.  He does have a history of weight loss but says he is gaining weight now.  He denies any claudication symptoms.  He has a history of coronary artery disease, status post CABG.  He has chronic renal insufficiency.  He is a diabetic  PAST MEDICAL HISTORY    Past Medical History:  Diagnosis Date   Anginal pain    Arthritis    CAD (coronary artery disease)    LHC (02/26/14):  >> PCI:  Xience DES to pRCA // LHC (03/10/14):  >> PCI:  Xience DES to mCFX // Myoview  2/16: low risk // Cath 2016: patent stents; stable dz - med Rx // Myoview  10/22: anterolateral ischemia, inferoapical infarct w peri infarct ischemia; EF 41; High Risk // Cath 12/22: LCx stent occl, severe LAD and Dx dz, diffuse RCA disease >> CABG   Carotid stenosis    a. Carotid US  (02/27/14):  Bilateral ICA 1-39%   CKD (chronic kidney disease) stage 3    Depression    Diabetes mellitus    Dissection of mesenteric artery    a. Mesenteric Artery Duplex (7/15):  pSMA chronic dissection with aneurysmal dilation of 1.29 cm (VVS);  b.  Chest CTA (02/26/14):  IMPRESSION:  1. No aortic   dissection or other acute abnormality.  2. 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  3. Stable 17 mm ectasia of the left common iliac artery.  4. Atherosclerosis, including aortoiliac and coronary artery disease.    HFmrEF (heart failure with mildly reduced EF) 09/2015   Echo 6/15: EF 50-55 // Echo 1/17: mod LVH, EF 55%, inf-lat HK, Gr 1 DD, mild LAE // Echocardiogram 10/22: EF 40-45, Gr 1 DD, mildly reduced RVSF, RVSP 26.6, trivial MR, trivial AI, post and ant-lat HK   History of kidney stones    Hyperlipidemia    Hypertension    Iliac artery aneurysm, left    Myocardial infarction (HCC)    PVC's (premature ventricular contractions) 07/21/2021   Monitor 10/22:  NSR avg HR 71; no AF/Flutter; no high grade HB or pathologic pauses PVC burden 20%, rare supraventricular beats w/o sustained arrhythmia    Stroke (HCC) 09/26/2009     FAMILY HISTORY   Family History  Problem Relation Age of Onset   Diabetes Father    Hypertension Father    Heart attack Father    Stroke Neg  Hx     SOCIAL HISTORY:   Social History   Socioeconomic History   Marital status: Married    Spouse name: Not on file   Number of children: 3   Years of education: Not on file   Highest education level: Not on file  Occupational History   Occupation: Retired  Tobacco Use   Smoking status: Former    Current packs/day: 0.00    Average packs/day: 0.3 packs/day for 38.0 years (9.5 ttl pk-yrs)    Types: Cigarettes    Start date: 49    Quit date: 2008    Years since quitting: 18.0   Smokeless tobacco: Never  Vaping Use   Vaping status: Never Used  Substance and Sexual Activity   Alcohol use: No    Alcohol/week: 0.0 standard drinks of alcohol   Drug use: No   Sexual activity: Yes  Other Topics Concern   Not on file  Social History Narrative   Marital: married.      Lives: with son,wife.       Children: 3 children; 6 grandchildren      Employed: unemployed; disability unknown reason/CVA L  sided weakness      Tobacco:  Quit 2012; smoked 40 years.       Alcohol: no drinking now; social in past.       Drugs: none       Exercise: sporadic.       ADLs:  No driving since CVA.   Social Drivers of Health   Tobacco Use: Medium Risk (10/07/2024)   Patient History    Smoking Tobacco Use: Former    Smokeless Tobacco Use: Never    Passive Exposure: Not on Actuary Strain: Not on file  Food Insecurity: No Food Insecurity (08/29/2024)   Epic    Worried About Programme Researcher, Broadcasting/film/video in the Last Year: Never true    Ran Out of Food in the Last Year: Never true  Transportation Needs: No Transportation Needs (08/29/2024)   Epic    Lack of Transportation (Medical): No    Lack of Transportation (Non-Medical): No  Physical Activity: Inactive (11/12/2021)   Exercise Vital Sign    Days of Exercise per Week: 0 days    Minutes of Exercise per Session: 0 min  Stress: Not on file  Social Connections: Unknown (08/28/2024)   Social Connection and Isolation Panel    Frequency of Communication with Friends and Family: Patient declined    Frequency of Social Gatherings with Friends and Family: Patient declined    Attends Religious Services: Not on file    Active Member of Clubs or Organizations: Patient declined    Attends Banker Meetings: Patient declined    Marital Status: Patient declined  Intimate Partner Violence: Not At Risk (08/29/2024)   Epic    Fear of Current or Ex-Partner: No    Emotionally Abused: No    Physically Abused: No    Sexually Abused: No  Depression (PHQ2-9): Low Risk (12/05/2022)   Depression (PHQ2-9)    PHQ-2 Score: 0  Alcohol Screen: Low Risk (11/12/2021)   Alcohol Screen    Last Alcohol Screening Score (AUDIT): 0  Housing: Unknown (08/29/2024)   Epic    Unable to Pay for Housing in the Last Year: No    Number of Times Moved in the Last Year: Not on file    Homeless in the Last Year: No  Utilities: Not At Risk (08/29/2024)   Epic  Threatened with loss of utilities: No  Health Literacy: Not on file    ALLERGIES:    Allergies[1]  CURRENT MEDICATIONS:    Current Outpatient Medications  Medication Sig Dispense Refill   aspirin  EC 81 MG tablet Take 1 tablet (81 mg total) by mouth daily. Swallow whole.     carvedilol  (COREG ) 3.125 MG tablet Take 1 tablet (3.125 mg total) by mouth 2 (two) times daily with a meal. 60 tablet 2   Continuous Glucose Sensor (DEXCOM G7 SENSOR) MISC Change as directed by manufacturer 3 each 3   isosorbide  mononitrate (IMDUR ) 30 MG 24 hr tablet Take 1 tablet (30 mg total) by mouth daily. You can take an additional half tablet as needed for chest pain 90 tablet 2   nitroGLYCERIN  (NITROSTAT ) 0.4 MG SL tablet Place 1 tablet (0.4 mg total) under the tongue every 5 (five) minutes as needed for chest pain. 25 tablet 3   rosuvastatin  (CRESTOR ) 40 MG tablet Take 1 tablet (40 mg total) by mouth daily. 90 tablet 3   sacubitril -valsartan  (ENTRESTO ) 24-26 MG Take 1 tablet by mouth 2 (two) times daily. 60 tablet 3   ticagrelor  (BRILINTA ) 90 MG TABS tablet Take 1 tablet (90 mg total) by mouth 2 (two) times daily. 60 tablet 3   No current facility-administered medications for this visit.    REVIEW OF SYSTEMS:   [X]  denotes positive finding, [ ]  denotes negative finding Cardiac  Comments:  Chest pain or chest pressure:    Shortness of breath upon exertion:    Short of breath when lying flat:    Irregular heart rhythm:        Vascular    Pain in calf, thigh, or hip brought on by ambulation:    Pain in feet at night that wakes you up from your sleep:     Blood clot in your veins:    Leg swelling:         Pulmonary    Oxygen at home:    Productive cough:     Wheezing:         Neurologic    Sudden weakness in arms or legs:     Sudden numbness in arms or legs:     Sudden onset of difficulty speaking or slurred speech:    Temporary loss of vision in one eye:     Problems with dizziness:          Gastrointestinal    Blood in stool:      Vomited blood:         Genitourinary    Burning when urinating:     Blood in urine:        Psychiatric    Major depression:         Hematologic    Bleeding problems:    Problems with blood clotting too easily:        Skin    Rashes or ulcers:        Constitutional    Fever or chills:     PHYSICAL EXAM:   Vitals:   10/07/24 0901  BP: (!) 157/76  Pulse: 62  Resp: 18  Temp: 98.2 F (36.8 C)  TempSrc: Temporal  SpO2: 97%  Weight: 161 lb 12.8 oz (73.4 kg)  Height: 5' 5 (1.651 m)    GENERAL: The patient is a well-nourished male, in no acute distress. The vital signs are documented above. CARDIAC: There is a regular rate and rhythm.  VASCULAR: Palpable pedal pulses  bilaterally.  No carotid bruits PULMONARY: Nonlabored respirations ABDOMEN: Soft and non-tender with normal pitched bowel sounds.  MUSCULOSKELETAL: There are no major deformities or cyanosis. NEUROLOGIC: No focal weakness or paresthesias are detected. SKIN: There are no ulcers or rashes noted. PSYCHIATRIC: The patient has a normal affect.  STUDIES:   I have reviewed the following CTA: 1. Stable appearance of SMA dissection with superimposed aneurysm. The aneurysmal component is stable measuring 1.2 cm. 2. Moderate stenosis of the celiac artery, similar. 3. Stable moderate stenosis of the right main renal artery. 4. Moderate to severe stenosis of the IMA origin, stable. 5. Similar appearance of atherosclerotic disease involving inflow vessels without common iliac or external iliac artery flow limiting stenosis.    ASSESSMENT and PLAN   Mesenteric dissection: This remains stable over a long period of time.  There is only been minimal aneurysmal degeneration with maximum diameter of 1.2 cm.  He also has significant atherosclerotic changes that are stable and asymptomatic.  I plan on repeating his CT scan in 2 years.  Today's visit was done with the assistance  of an interpreter   Malvina New, IV, MD, FACS [facility]ists of [facility] [phone] Pager [phone]      [1]  Allergies Allergen Reactions   Metformin  And Related Other (See Comments)    Pt feels that this causes constipation and will not take    "

## 2024-10-09 ENCOUNTER — Ambulatory Visit: Payer: Self-pay

## 2024-10-09 NOTE — Telephone Encounter (Signed)
 FYI Only or Action Required?: FYI only for provider: ED advised.  Patient was last seen in primary care on 04/08/2024 by Copland, Harlene BROCKS, MD.  Called Nurse Triage reporting Hyperglycemia.  Interventions attempted: Prescription medications: metformin , insulin  and Dietary changes.  Symptoms are: rapidly worsening.  Triage Disposition: Go to ED Now (or PCP Triage)  Patient/caregiver understands and will follow disposition?: Yes      Copied from CRM #[id number]. Topic: Clinical - Red Word Triage >> Oct 09, 2024  9:35 AM Alfonso HERO wrote: Red Word that prompted transfer to Nurse Triage: sugar elevated it was over 400 and now the monitor is not reading Reason for Disposition  Blood glucose > 500 mg/dL (72.1 mmol/L)  Answer Assessment - Initial Assessment Questions This RN recommended pt be examined in hospital asap. Pt verbalized understanding, will head to ER via loved one. Advised call 911 if any new or worsening symptoms.     Speaking to Yahya Boldman daughter in law  1. BLOOD GLUCOSE: What is your blood glucose level?      At least over 400, dexcom won't register beyond 400 and is not registering since too high  6. INSULIN : Do you take insulin ? What type of insulin (s) do you use? What is the mode of delivery? (syringe, pen; injection or pump)?      Insulin  she thinks, not sure what kind  7. DIABETES PILLS: Do you take any pills for your diabetes? If Yes, ask: Have you missed taking any pills recently?     Been taking metformin  500 mg morning and evening  8. OTHER SYMPTOMS: Do you have any symptoms? (e.g., fever, frequent urination, difficulty breathing, dizziness, weakness, vomiting)     Headache Really fatigued  Denies: Changes to awareness/consciousness/speech Too weak to stand Rapid breathing to her knowledge Vomiting  Protocols used: Diabetes - High Blood Sugar-A-AH

## 2024-10-09 NOTE — Telephone Encounter (Signed)
 FYI

## 2024-10-16 ENCOUNTER — Ambulatory Visit: Payer: Self-pay

## 2024-10-16 NOTE — Telephone Encounter (Signed)
 I called his son and made him an appt for 11:40 tomorrow, [name] will let his dad know and also ask him to please bring all his medications with him

## 2024-10-16 NOTE — Progress Notes (Unsigned)
 [facility] [address], [address] [place], [place] [address] [phone]  Date:  10/17/2024   Name:  Barry [name]   DOB:  [birth year]   MRN:  [id number]  PCP:  Watt Harlene BROCKS, MD    Chief Complaint: No chief complaint on file.   History of Present Illness:  Barry [name] is a 74 y.o. very pleasant male patient who presents with the following:  Patient seen today with concern of elevated blood sugar.  I saw him most recently in July at which time I asked him to please follow-up in 2 months- PMH CAD s/p CABG 2022, CHF, TIA/CVA, DMII, mesenteric artery dissection, medication noncompliance and language barrier In the meantime he was admitted to the hospital in December-December 1 through December 3 with chest pain He had a repeat cardiac cath and another stent placement His A1c has remained stable, it was 9.5 in May of last year At the time of our last visit patient reported taking his Lantus  regularly, we plan to have him titrate up the dose and follow-up in a few weeks  Lab Results  Component Value Date   HGBA1C 9.1 (H) 08/26/2024   Discussed the use of AI scribe software for clinical note transcription with the patient, who gave verbal consent to proceed.  History of Present Illness     Patient Active Problem List   Diagnosis Date Noted   Chest pain 08/26/2024   Elevated troponin 08/26/2024   Uncontrolled type 2 diabetes mellitus with hyperglycemia, with long-term current use of insulin  (HCC) 08/26/2024   Pure hypercholesterolemia 08/26/2024   Ischemic cardiomyopathy 08/26/2024   Abnormal nuclear cardiac imaging test    S/P CABG x 4 09/21/2021   Abnormal nuclear stress test 08/31/2021   PVC's (premature ventricular contractions) 07/21/2021   CKD (chronic kidney disease) stage 3, GFR 30-59 ml/min (HCC) 07/21/2021   HFmrEF (heart failure with mildly reduced EF) 09/2015   Atherosclerotic peripheral vascular disease with  ulceration (HCC) 04/03/2015   Aneurysm of iliac artery 03/18/2014   Abdominal pain 03/18/2014   Syncope in setting of nausea and vomiting 03/07/14 03/08/2014   Coronary artery disease 02/28/2014   Non compliance with medical treatment 02/28/2014   Dyslipidemia, goal LDL below 70 02/28/2014   Bradycardia- beta blocker decreased 02/28/2014   Acute on chronic heart failure with mildly reduced ejection fraction (HFmrEF) (HCC) 02/28/2014   Type 2 diabetes mellitus with manifestations (HCC) 02/27/2014   TIA (transient ischemic attack) 02/26/2014   History of non-ST elevation myocardial infarction (NSTEMI) 02/26/2014   Weakness-Left arm / Lef 12/06/2013   Mesenteric artery stenosis 12/06/2013   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HCC) 12/03/2012   Hypertension 11/10/2011   History of CVA in adulthood 11/10/2011   Language barrier 11/10/2011    Past Medical History:  Diagnosis Date   Anginal pain    Arthritis    CAD (coronary artery disease)    LHC (02/26/14):  >> PCI:  Xience DES to pRCA // LHC (03/10/14):  >> PCI:  Xience DES to mCFX // Myoview  2/16: low risk // Cath 2016: patent stents; stable dz - med Rx // Myoview  10/22: anterolateral ischemia, inferoapical infarct w peri infarct ischemia; EF 41; High Risk // Cath 12/22: LCx stent occl, severe LAD and Dx dz, diffuse RCA disease >> CABG   Carotid stenosis    a. Carotid US  (02/27/14):  Bilateral ICA 1-39%   CKD (chronic kidney disease) stage 3  Depression    Diabetes mellitus    Dissection of mesenteric artery    a. Mesenteric Artery Duplex (7/15):  pSMA chronic dissection with aneurysmal dilation of 1.29 cm (VVS);  b.  Chest CTA (02/26/14):  IMPRESSION:  1. No aortic  dissection or other acute abnormality.  2. 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  3. Stable 17 mm ectasia of the left common iliac artery.  4. Atherosclerosis, including aortoiliac and coronary artery disease.    HFmrEF (heart failure with mildly reduced EF) 09/2015    Echo 6/15: EF 50-55 // Echo 1/17: mod LVH, EF 55%, inf-lat HK, Gr 1 DD, mild LAE // Echocardiogram 10/22: EF 40-45, Gr 1 DD, mildly reduced RVSF, RVSP 26.6, trivial MR, trivial AI, post and ant-lat HK   History of kidney stones    Hyperlipidemia    Hypertension    Iliac artery aneurysm, left    Myocardial infarction (HCC)    PVC's (premature ventricular contractions) 07/21/2021   Monitor 10/22:  NSR avg HR 71; no AF/Flutter; no high grade HB or pathologic pauses PVC burden 20%, rare supraventricular beats w/o sustained arrhythmia    Stroke (HCC) 09/26/2009    Past Surgical History:  Procedure Laterality Date   CARDIAC CATHETERIZATION     CARDIAC CATHETERIZATION N/A 06/25/2015   Procedure: Left Heart Cath and Coronary Angiography;  Surgeon: Lonni JONETTA Cash, MD;  Location: [facility] INVASIVE CV LAB;  Service: Cardiovascular;  Laterality: N/A;   COLONOSCOPY  2016   DJ-MAC-moviprep (adeq)-TA-54yr recall   COLONOSCOPY WITH PROPOFOL  N/A 07/23/2015   Procedure: COLONOSCOPY WITH PROPOFOL ;  Surgeon: Toribio SHAUNNA Cedar, MD;  Location: [facility] ENDOSCOPY;  Service: Endoscopy;  Laterality: N/A;   CORONARY ANGIOPLASTY     CORONARY ARTERY BYPASS GRAFT N/A 09/21/2021   Procedure: CORONARY ARTERY BYPASS GRAFTING (CABG)  X FOUR, ON PUMP, USING LEFT INTERNAL MAMMARY ARTERY AND RIGHT ENDOSCOPIC GREATER SAPHENOUS VEIN CONDUITS;  Surgeon: Shyrl Linnie KIDD, MD;  Location: [facility] OR;  Service: Open Heart Surgery;  Laterality: N/A;   CORONARY STENT INTERVENTION N/A 08/27/2024   Procedure: CORONARY STENT INTERVENTION;  Surgeon: Ladona Heinz, MD;  Location: [facility] INVASIVE CV LAB;  Service: Cardiovascular;  Laterality: N/A;   CYSTOSCOPY/URETEROSCOPY/HOLMIUM LASER/STENT PLACEMENT Right 03/28/2023   Procedure: CYSTOSCOPY, RIGHT RETROGRADE PYELOGRAM, DIAGNOSTIC RIGHT URETEROSCOPY;  Surgeon: Elisabeth Valli JONETTA, MD;  Location: [facility] ORS;  Service: Urology;  Laterality: Right;   ENDOVEIN HARVEST OF GREATER SAPHENOUS VEIN  09/21/2021    Procedure: ENDOVEIN HARVEST OF GREATER SAPHENOUS VEIN;  Surgeon: Shyrl Linnie KIDD, MD;  Location: [facility] OR;  Service: Open Heart Surgery;;   EXTRACORPOREAL SHOCK WAVE LITHOTRIPSY Right 11/21/2022   Procedure: EXTRACORPOREAL SHOCK WAVE LITHOTRIPSY (ESWL);  Surgeon: Devere Lonni Righter, MD;  Location: [facility];  Service: Urology;  Laterality: Right;   LEFT HEART CATH AND CORONARY ANGIOGRAPHY N/A 08/31/2021   Procedure: LEFT HEART CATH AND CORONARY ANGIOGRAPHY;  Surgeon: Claudene Victory ORN, MD;  Location: [facility] INVASIVE CV LAB;  Service: Cardiovascular;  Laterality: N/A;   LEFT HEART CATHETERIZATION WITH CORONARY ANGIOGRAM N/A 02/26/2014   Procedure: LEFT HEART CATHETERIZATION WITH CORONARY ANGIOGRAM;  Surgeon: Deatrice DELENA Cage, MD;  Location: [facility] CATH LAB;  Service: Cardiovascular;  Laterality: N/A;   LEFT HEART CATHETERIZATION WITH CORONARY ANGIOGRAM N/A 03/10/2014   Procedure: LEFT HEART CATHETERIZATION WITH CORONARY ANGIOGRAM;  Surgeon: Candyce GORMAN Reek, MD;  Location: [facility] CATH LAB;  Service: Cardiovascular;  Laterality: N/A;   POLYPECTOMY  2016   TA   RIGHT HEART CATH AND  CORONARY/GRAFT ANGIOGRAPHY N/A 07/01/2022   Procedure: RIGHT HEART CATH AND CORONARY/GRAFT ANGIOGRAPHY;  Surgeon: Wonda Sharper, MD;  Location: [facility] INVASIVE CV LAB;  Service: Cardiovascular;  Laterality: N/A;   RIGHT/LEFT HEART CATH AND CORONARY/GRAFT ANGIOGRAPHY N/A 08/27/2024   Procedure: RIGHT/LEFT HEART CATH AND CORONARY/GRAFT ANGIOGRAPHY;  Surgeon: Ladona Heinz, MD;  Location: [facility] INVASIVE CV LAB;  Service: Cardiovascular;  Laterality: N/A;   TEE WITHOUT CARDIOVERSION N/A 09/21/2021   Procedure: TRANSESOPHAGEAL ECHOCARDIOGRAM (TEE);  Surgeon: Shyrl Linnie KIDD, MD;  Location: [facility] OR;  Service: Open Heart Surgery;  Laterality: N/A;    Social History[1]  Family History  Problem Relation Age of Onset   Diabetes Father    Hypertension Father    Heart attack Father    Stroke Neg Hx      Allergies[2]  Medication list has been reviewed and updated.  Medications Ordered Prior to Encounter[3]  Review of Systems:  As per HPI- otherwise negative.   Physical Examination: There were no vitals filed for this visit. There were no vitals filed for this visit. There is no height or weight on file to calculate BMI. Ideal Body Weight:    GEN: no acute distress. HEENT: Atraumatic, Normocephalic.  Ears and Nose: No external deformity. CV: RRR, No M/G/R. No JVD. No thrill. No extra heart sounds. PULM: CTA B, no wheezes, crackles, rhonchi. No retractions. No resp. distress. No accessory muscle use. ABD: S, NT, ND, +BS. No rebound. No HSM. EXTR: No c/c/e PSYCH: Normally interactive. Conversant.    Assessment and Plan: No diagnosis found.  Assessment & Plan   Signed Harlene Schroeder, MD    [1]  Social History Tobacco Use   Smoking status: Former    Current packs/day: 0.00    Average packs/day: 0.3 packs/day for 38.0 years (9.5 ttl pk-yrs)    Types: Cigarettes    Start date: 19    Quit date: 2008    Years since quitting: 18.0   Smokeless tobacco: Never  Vaping Use   Vaping status: Never Used  Substance Use Topics   Alcohol use: No    Alcohol/week: 0.0 standard drinks of alcohol   Drug use: No  [2]  Allergies Allergen Reactions   Metformin  And Related Other (See Comments)    Pt feels that this causes constipation and will not take   [3]  Current Outpatient Medications on File Prior to Visit  Medication Sig Dispense Refill   aspirin  EC 81 MG tablet Take 1 tablet (81 mg total) by mouth daily. Swallow whole.     carvedilol  (COREG ) 3.125 MG tablet Take 1 tablet (3.125 mg total) by mouth 2 (two) times daily with a meal. 60 tablet 2   Continuous Glucose Sensor (DEXCOM G7 SENSOR) MISC Change as directed by manufacturer 3 each 3   isosorbide  mononitrate (IMDUR ) 30 MG 24 hr tablet Take 1 tablet (30 mg total) by mouth daily. You can take an additional half  tablet as needed for chest pain 90 tablet 2   nitroGLYCERIN  (NITROSTAT ) 0.4 MG SL tablet Place 1 tablet (0.4 mg total) under the tongue every 5 (five) minutes as needed for chest pain. 25 tablet 3   rosuvastatin  (CRESTOR ) 40 MG tablet Take 1 tablet (40 mg total) by mouth daily. 90 tablet 3   sacubitril -valsartan  (ENTRESTO ) 24-26 MG Take 1 tablet by mouth 2 (two) times daily. 60 tablet 3   ticagrelor  (BRILINTA ) 90 MG TABS tablet Take 1 tablet (90 mg total) by mouth 2 (two) times daily. 60 tablet 3  No current facility-administered medications on file prior to visit.   "

## 2024-10-16 NOTE — Telephone Encounter (Signed)
 FYI Only or Action Required?: Action required by provider: update on patient condition and patient scheduled for 10/28/2024.  Patient was last seen in primary care on 04/08/2024 by Copland, Harlene BROCKS, MD.  Called Nurse Triage reporting Diabetes and Fatigue.  Symptoms began unsure.  Interventions attempted: Other: unsure.  Symptoms are: unchanged.  Triage Disposition: Home Care  Patient/caregiver understands and will follow disposition?: Yes  Message from Anderson S sent at 10/16/2024  2:35 PM EST  Reason for Triage: Diabetes management appt. Very weak. Blood sugar up and down, above 400.   Reason for Disposition  [1] Blood glucose > 300 mg/dL (83.2 mmol/L) AND [7] uses insulin  (e.g., insulin -dependent, all people with type 1 diabetes)  Answer Assessment - Initial Assessment Questions Patient's daughter calling to report elevated blood sugars for unknown length of time  1. BLOOD GLUCOSE: What is your blood glucose level?      Daughter states that his blood sugars run 300's - 400's 2. ONSET: When did you check the blood glucose?     unsure 3. USUAL RANGE: What is your glucose level usually? (e.g., usual fasting morning value, usual evening value)     Unsure, blood sugar always high  6. INSULIN : Do you take insulin ? What type of insulin (s) do you use? What is the mode of delivery? (syringe, pen; injection or pump)?      Yes, per daughter 3. DIABETES PILLS: Do you take any pills for your diabetes? If Yes, ask: Have you missed taking any pills recently?     Yes, per daughter 83. OTHER SYMPTOMS: Do you have any symptoms? (e.g., fever, frequent urination, difficulty breathing, dizziness, weakness, vomiting)     Weakness, fatigue  Protocols used: Diabetes - High Blood Sugar-A-AH

## 2024-10-17 ENCOUNTER — Encounter: Payer: Self-pay | Admitting: Family Medicine

## 2024-10-17 ENCOUNTER — Ambulatory Visit: Admitting: Family Medicine

## 2024-10-17 VITALS — BP 122/85 | HR 75 | Ht 65.0 in | Wt 162.6 lb

## 2024-10-17 DIAGNOSIS — Z794 Long term (current) use of insulin: Secondary | ICD-10-CM

## 2024-10-17 DIAGNOSIS — E1142 Type 2 diabetes mellitus with diabetic polyneuropathy: Secondary | ICD-10-CM

## 2024-10-17 DIAGNOSIS — E118 Type 2 diabetes mellitus with unspecified complications: Secondary | ICD-10-CM

## 2024-10-17 DIAGNOSIS — Z951 Presence of aortocoronary bypass graft: Secondary | ICD-10-CM | POA: Diagnosis not present

## 2024-10-17 LAB — BASIC METABOLIC PANEL WITH GFR
BUN: 16 mg/dL (ref 6–23)
CO2: 26 meq/L (ref 19–32)
Calcium: 9.6 mg/dL (ref 8.4–10.5)
Chloride: 103 meq/L (ref 96–112)
Creatinine, Ser: 1.2 mg/dL (ref 0.40–1.50)
GFR: 59.48 mL/min — ABNORMAL LOW
Glucose, Bld: 254 mg/dL — ABNORMAL HIGH (ref 70–99)
Potassium: 4 meq/L (ref 3.5–5.1)
Sodium: 138 meq/L (ref 135–145)

## 2024-10-17 NOTE — Patient Instructions (Addendum)
 Check blood sugar first thing in the morning- as long as your blood sugar is over 150 increase lantus  by 2 units every 2 or 3 days  Oak increase to 34 units now Can increase to 36 units in 2 or 3 days  See me in 2 weeks as we planned  Please start on some B12 OTC- 1000 mcg daily- this may help with the tingling that you have noticed

## 2024-10-25 ENCOUNTER — Other Ambulatory Visit (HOSPITAL_COMMUNITY): Payer: Self-pay

## 2024-10-28 ENCOUNTER — Ambulatory Visit: Admitting: Family Medicine

## 2024-10-30 ENCOUNTER — Ambulatory Visit (HOSPITAL_BASED_OUTPATIENT_CLINIC_OR_DEPARTMENT_OTHER)
Admission: RE | Admit: 2024-10-30 | Discharge: 2024-10-30 | Disposition: A | Source: Ambulatory Visit | Attending: Physician Assistant

## 2024-10-30 ENCOUNTER — Encounter: Payer: Self-pay | Admitting: Physician Assistant

## 2024-10-30 ENCOUNTER — Ambulatory Visit: Payer: Self-pay | Admitting: Physician Assistant

## 2024-10-30 ENCOUNTER — Ambulatory Visit: Admitting: Physician Assistant

## 2024-10-30 VITALS — BP 138/87 | HR 75 | Temp 98.4°F | Resp 16 | Ht 65.0 in | Wt 161.2 lb

## 2024-10-30 DIAGNOSIS — R051 Acute cough: Secondary | ICD-10-CM

## 2024-10-30 LAB — POC COVID19/FLU A&B COMBO
Covid Antigen, POC: NEGATIVE
Influenza A Antigen, POC: NEGATIVE
Influenza B Antigen, POC: NEGATIVE

## 2024-10-30 NOTE — Patient Instructions (Addendum)
" °  VISIT SUMMARY: Today, you were seen for a persistent cough and heartburn. You have had a cough for two weeks with associated heartburn and mild chest tightness during coughing. You recently had a stent placed for coronary artery disease and have been having difficulty controlling your blood sugar levels.  YOUR PLAN: -ACUTE UPPER RESPIRATORY INFECTION WITH COUGH: You have a cough that has lasted for two weeks, likely due to an upper respiratory infection. We have ordered a chest x-ray to rule out pneumonia. Please continue using over-the-counter cough medication and apply Vicks vapor rub on your chest. Monitor your temperature regularly and use Tylenol  if you develop a fever. If the chest x-ray shows pneumonia or if your symptoms worsen, we may consider antibiotics.  -TYPE 2 DIABETES MELLITUS WITH HYPERGLYCEMIA: Your blood sugar levels have been difficult to control. You are currently taking Lantus  twice daily. Our goal is to achieve fasting blood sugar levels under 150 mg/dL. Please continue taking Lantus  twice daily and increase the dose by 1-2 units every other day as needed to reach the target fasting blood sugar level.   INSTRUCTIONS: Please follow up with us  in two weeks if symptoms fail to improve or sooner if your symptoms worsen. Continue monitoring your blood sugar levels and adjust your Lantus  dose as needed. If you have any new or worsening symptoms, please contact our office.    Contains text generated by [employer].   "

## 2024-10-30 NOTE — Progress Notes (Signed)
 "  Acute Office Visit  Subjective:     Patient ID: Barry [name], male    DOB: 1951/07/25, 74 y.o.   MRN: [id number]  Chief Complaint  Patient presents with   Cough    Started 2 weeks. Taking Coricidin     HPI Patient is a pleasant 74 year old male with PMH of HTN, HLD, T2DM with most recent A1c 9.1%, CAD s/p CABG x 4 in 2022 and RCA DES placement 08/2024, HFrEF 40 to 45%, and mesenteric artery dissection who presents to clinic for not feeling well.  Reviewed chart and he was admitted to the hospital 08/26/2024 for chest pain and repeat cardiac catheterization.  Cardiac cath revealed worsening stenosis involving mid to distal RCA and he underwent DES placement.  Patient follows with Dr. Ganji for cardiology.  Patient is accompanied by his wife at bedside who assisted in translation in addition to her son who joins via speaker phone.  They report a 2-week history of cough occasionally productive of nonbloody sputum.  They have been checking temperature regularly at home and denies any fevers or chills.  He states that he had a slight sore throat at the onset of his cough, but no ongoing sore throat symptoms, nasal congestion, nausea, hematemesis, chest pain, shortness of breath, weight gain, difficulty eating or drinking, or changes in bowel habits.  Specifically, no melena.  He does report that he has slight abdominal discomfort when coughing, but none at rest.  He describes the discomfort as a heartburn.  Son reports that he used to take a proton pump inhibitor for heartburn symptoms months ago, but none recently.  Patient confirmed on multiple occasions that the heartburn is only at time of cough and he denies any chest discomfort or dyspnea otherwise.  For cold symptoms, basically limited to tylenol  and a second-generation antihistamine that may or may not even provide symptom relief.   ROS See HPI, all else negative.      Objective:    BP 138/87   Pulse 75   Temp 98.4 F (36.9 C)    Resp 16   Ht 5' 5 (1.651 m)   Wt 161 lb 3.2 oz (73.1 kg)   SpO2 97%   BMI 26.83 kg/m    Physical Exam Constitutional:      Appearance: Normal appearance.  Eyes:     Extraocular Movements: Extraocular movements intact.  Cardiovascular:     Rate and Rhythm: Normal rate and regular rhythm.     Pulses: Normal pulses.     Heart sounds: Normal heart sounds.  Pulmonary:     Effort: Pulmonary effort is normal.     Comments: No increased work of breathing. Speaks in full sentences. Minimal rhonchi auscultated right lower lung. Musculoskeletal:        General: Normal range of motion.     Cervical back: Normal range of motion.  Neurological:     Mental Status: He is alert and oriented to person, place, and time.  Psychiatric:        Behavior: Behavior normal.     Results for orders placed or performed in visit on 10/30/24  POC Covid19/Flu A&B Antigen  Result Value Ref Range   Influenza A Antigen, POC Negative Negative   Influenza B Antigen, POC Negative Negative   Covid Antigen, POC Negative Negative        Assessment & Plan:   Problem List Items Addressed This Visit   None Visit Diagnoses       Acute cough    -  Primary   Relevant Orders   POC Covid19/Flu A&B Antigen (Completed)   DG Chest 2 View (Completed)      Assessment and Plan Assessment & Plan Acute upper respiratory infection with cough - Cough persisting for two weeks - Differential includes post-viral cough and bacterial pneumonia.  - Ordered chest x-ray to rule out pneumonia. - Continue over-the-counter cough medication. - Use Vicks vapor rub on chest. - Continue to monitor for fevers. - Will obtain stat outpatient 2-view CXR and prescribe antibiotics if indicated.  Type 2 diabetes mellitus with hyperglycemia - Blood sugar levels are reportedly difficult to control. Currently on Lantus  twice daily. Goal is to achieve fasting blood sugar under 150 mg/dL. - Increase Lantus  by 1-2 units per day as  needed to achieve fasting blood sugar under 150 mg/dL.  Coronary artery disease, status post stent placement - Recent stent placement. No current chest pain or dyspnea. Symptoms do not resemble previous cardiac events. - Continue taking medications as directed.    No orders of the defined types were placed in this encounter.   Return in about 2 weeks (around 11/13/2024), or if symptoms fail to improve. Call us  if they worsen in the interim.SABRA Honora Seip, PA-C   "

## 2024-10-31 ENCOUNTER — Ambulatory Visit: Admitting: Family Medicine

## 2024-10-31 ENCOUNTER — Encounter: Payer: Self-pay | Admitting: Family Medicine

## 2024-10-31 ENCOUNTER — Other Ambulatory Visit (HOSPITAL_BASED_OUTPATIENT_CLINIC_OR_DEPARTMENT_OTHER): Payer: Self-pay

## 2024-10-31 VITALS — BP 140/80 | HR 68 | Temp 98.3°F | Resp 16 | Ht 65.0 in | Wt 161.2 lb

## 2024-10-31 DIAGNOSIS — E118 Type 2 diabetes mellitus with unspecified complications: Secondary | ICD-10-CM

## 2024-10-31 DIAGNOSIS — J209 Acute bronchitis, unspecified: Secondary | ICD-10-CM

## 2024-10-31 MED ORDER — DOXYCYCLINE HYCLATE 100 MG PO CAPS
100.0000 mg | ORAL_CAPSULE | Freq: Two times a day (BID) | ORAL | 0 refills | Status: AC
  Filled 2024-10-31: qty 20, 10d supply, fill #0

## 2024-10-31 MED ORDER — FAMOTIDINE 20 MG PO TABS
20.0000 mg | ORAL_TABLET | Freq: Every day | ORAL | 3 refills | Status: AC
  Filled 2024-10-31: qty 30, 30d supply, fill #0

## 2024-10-31 NOTE — Patient Instructions (Addendum)
 It was good to see you again today I am going to treat you with doxycycline  for cough- take twice daily for 10 days We will visit again in 3 weeks to check on your blood sugar  For your diabetes; please increase your lantus  insulin  by 2 units every 2 days until your fasting glucose is about 150  We will see each other in 3 weeks to check on how you are doing

## 2024-11-20 ENCOUNTER — Ambulatory Visit: Admitting: Family Medicine

## 2024-11-20 ENCOUNTER — Ambulatory Visit

## 2024-12-05 ENCOUNTER — Ambulatory Visit: Admitting: Emergency Medicine
